# Patient Record
Sex: Female | Born: 1935
Health system: Southern US, Community
[De-identification: ages and names within clinical notes are randomized; demographics above are authoritative.]

## PROBLEM LIST (undated history)

## (undated) DIAGNOSIS — I482 Chronic atrial fibrillation, unspecified: Secondary | ICD-10-CM

## (undated) DIAGNOSIS — I272 Pulmonary hypertension, unspecified: Secondary | ICD-10-CM

## (undated) DIAGNOSIS — C7B09 Secondary carcinoid tumors of other sites: Secondary | ICD-10-CM

## (undated) DIAGNOSIS — F329 Major depressive disorder, single episode, unspecified: Secondary | ICD-10-CM

## (undated) DIAGNOSIS — R42 Dizziness and giddiness: Secondary | ICD-10-CM

## (undated) DIAGNOSIS — L989 Disorder of the skin and subcutaneous tissue, unspecified: Secondary | ICD-10-CM

## (undated) DIAGNOSIS — M1990 Unspecified osteoarthritis, unspecified site: Secondary | ICD-10-CM

## (undated) DIAGNOSIS — E039 Hypothyroidism, unspecified: Secondary | ICD-10-CM

## (undated) DIAGNOSIS — M3130 Wegener's granulomatosis without renal involvement: Secondary | ICD-10-CM

## (undated) DIAGNOSIS — C50919 Malignant neoplasm of unspecified site of unspecified female breast: Secondary | ICD-10-CM

## (undated) DIAGNOSIS — I639 Cerebral infarction, unspecified: Secondary | ICD-10-CM

## (undated) DIAGNOSIS — E785 Hyperlipidemia, unspecified: Secondary | ICD-10-CM

## (undated) DIAGNOSIS — E119 Type 2 diabetes mellitus without complications: Secondary | ICD-10-CM

## (undated) DIAGNOSIS — I10 Essential (primary) hypertension: Secondary | ICD-10-CM

## (undated) DIAGNOSIS — J189 Pneumonia, unspecified organism: Secondary | ICD-10-CM

## (undated) DIAGNOSIS — R011 Cardiac murmur, unspecified: Secondary | ICD-10-CM

## (undated) HISTORY — PX: EXPLORATORY LAPAROTOMY: SUR591

## (undated) HISTORY — DX: Pneumonia, unspecified organism: J18.9

## (undated) HISTORY — PX: JOINT REPLACEMENT: SHX530

## (undated) HISTORY — DX: Unspecified osteoarthritis, unspecified site: M19.90

## (undated) HISTORY — DX: Cerebral infarction, unspecified: I63.9

## (undated) HISTORY — DX: Malignant neoplasm of unspecified site of unspecified female breast: C50.919

## (undated) HISTORY — DX: Type 2 diabetes mellitus without complications: E11.9

## (undated) HISTORY — DX: Dizziness and giddiness: R42

## (undated) HISTORY — DX: Major depressive disorder, single episode, unspecified: F32.9

## (undated) HISTORY — DX: Wegener's granulomatosis without renal involvement: M31.30

## (undated) HISTORY — PX: HEEL SPUR SURGERY: SHX665

## (undated) HISTORY — DX: Essential (primary) hypertension: I10

## (undated) HISTORY — DX: Hyperlipidemia, unspecified: E78.5

## (undated) HISTORY — DX: Hypothyroidism, unspecified: E03.9

---

## 1978-04-07 HISTORY — PX: CHOLECYSTECTOMY: SHX55

## 1999-01-24 ENCOUNTER — Encounter: Payer: Self-pay | Admitting: *Deleted

## 1999-01-24 ENCOUNTER — Emergency Department (HOSPITAL_COMMUNITY): Admission: EM | Admit: 1999-01-24 | Discharge: 1999-01-24 | Payer: Self-pay | Admitting: *Deleted

## 1999-07-18 ENCOUNTER — Encounter: Admission: RE | Admit: 1999-07-18 | Discharge: 1999-07-18 | Payer: Self-pay | Admitting: Obstetrics and Gynecology

## 1999-07-18 ENCOUNTER — Encounter: Payer: Self-pay | Admitting: Obstetrics and Gynecology

## 2000-01-13 ENCOUNTER — Encounter: Payer: Self-pay | Admitting: Sports Medicine

## 2000-01-13 ENCOUNTER — Ambulatory Visit (HOSPITAL_COMMUNITY): Admission: RE | Admit: 2000-01-13 | Discharge: 2000-01-13 | Payer: Self-pay | Admitting: Sports Medicine

## 2000-07-20 ENCOUNTER — Encounter: Admission: RE | Admit: 2000-07-20 | Discharge: 2000-07-20 | Payer: Self-pay | Admitting: Obstetrics and Gynecology

## 2000-07-20 ENCOUNTER — Encounter: Payer: Self-pay | Admitting: Obstetrics and Gynecology

## 2001-07-22 ENCOUNTER — Encounter: Admission: RE | Admit: 2001-07-22 | Discharge: 2001-07-22 | Payer: Self-pay | Admitting: Obstetrics and Gynecology

## 2001-07-22 ENCOUNTER — Encounter: Payer: Self-pay | Admitting: Obstetrics and Gynecology

## 2001-11-11 ENCOUNTER — Encounter: Payer: Self-pay | Admitting: Family Medicine

## 2001-11-11 ENCOUNTER — Encounter: Admission: RE | Admit: 2001-11-11 | Discharge: 2001-11-11 | Payer: Self-pay | Admitting: Family Medicine

## 2002-03-10 ENCOUNTER — Ambulatory Visit (HOSPITAL_COMMUNITY): Admission: RE | Admit: 2002-03-10 | Discharge: 2002-03-10 | Payer: Self-pay | Admitting: Gastroenterology

## 2002-07-19 ENCOUNTER — Encounter: Payer: Self-pay | Admitting: Obstetrics and Gynecology

## 2002-07-19 ENCOUNTER — Encounter: Admission: RE | Admit: 2002-07-19 | Discharge: 2002-07-19 | Payer: Self-pay | Admitting: Obstetrics and Gynecology

## 2002-12-02 ENCOUNTER — Encounter: Admission: RE | Admit: 2002-12-02 | Discharge: 2003-01-18 | Payer: Self-pay | Admitting: Neurology

## 2003-05-05 LAB — HM COLONOSCOPY: HM Colonoscopy: NORMAL

## 2003-07-26 ENCOUNTER — Encounter: Admission: RE | Admit: 2003-07-26 | Discharge: 2003-07-26 | Payer: Self-pay | Admitting: Obstetrics and Gynecology

## 2004-07-31 ENCOUNTER — Encounter: Admission: RE | Admit: 2004-07-31 | Discharge: 2004-07-31 | Payer: Self-pay | Admitting: Obstetrics and Gynecology

## 2004-08-15 ENCOUNTER — Encounter: Admission: RE | Admit: 2004-08-15 | Discharge: 2004-08-15 | Payer: Self-pay | Admitting: Obstetrics and Gynecology

## 2005-04-07 HISTORY — PX: BREAST SURGERY: SHX581

## 2005-08-06 ENCOUNTER — Encounter: Admission: RE | Admit: 2005-08-06 | Discharge: 2005-08-06 | Payer: Self-pay | Admitting: Obstetrics and Gynecology

## 2005-08-19 ENCOUNTER — Encounter (INDEPENDENT_AMBULATORY_CARE_PROVIDER_SITE_OTHER): Payer: Self-pay | Admitting: *Deleted

## 2005-08-19 ENCOUNTER — Encounter (INDEPENDENT_AMBULATORY_CARE_PROVIDER_SITE_OTHER): Payer: Self-pay | Admitting: Diagnostic Radiology

## 2005-08-19 ENCOUNTER — Encounter: Admission: RE | Admit: 2005-08-19 | Discharge: 2005-08-19 | Payer: Self-pay | Admitting: Obstetrics and Gynecology

## 2005-08-31 ENCOUNTER — Encounter: Admission: RE | Admit: 2005-08-31 | Discharge: 2005-08-31 | Payer: Self-pay | Admitting: Obstetrics and Gynecology

## 2005-09-04 ENCOUNTER — Encounter: Admission: RE | Admit: 2005-09-04 | Discharge: 2005-09-04 | Payer: Self-pay | Admitting: Obstetrics and Gynecology

## 2005-09-09 ENCOUNTER — Encounter (INDEPENDENT_AMBULATORY_CARE_PROVIDER_SITE_OTHER): Payer: Self-pay | Admitting: *Deleted

## 2005-09-09 ENCOUNTER — Encounter: Admission: RE | Admit: 2005-09-09 | Discharge: 2005-09-09 | Payer: Self-pay | Admitting: Surgery

## 2005-09-09 ENCOUNTER — Ambulatory Visit (HOSPITAL_BASED_OUTPATIENT_CLINIC_OR_DEPARTMENT_OTHER): Admission: RE | Admit: 2005-09-09 | Discharge: 2005-09-09 | Payer: Self-pay | Admitting: Surgery

## 2005-09-25 ENCOUNTER — Ambulatory Visit: Payer: Self-pay | Admitting: Oncology

## 2005-10-01 LAB — CBC WITH DIFFERENTIAL/PLATELET
Basophils Absolute: 0.1 10*3/uL (ref 0.0–0.1)
EOS%: 5 % (ref 0.0–7.0)
HGB: 11.1 g/dL — ABNORMAL LOW (ref 11.6–15.9)
MCH: 33 pg (ref 26.0–34.0)
MONO#: 0.8 10*3/uL (ref 0.1–0.9)
NEUT#: 3.9 10*3/uL (ref 1.5–6.5)
RDW: 12 % (ref 11.3–14.5)
WBC: 7.2 10*3/uL (ref 3.9–10.0)
lymph#: 2.1 10*3/uL (ref 0.9–3.3)

## 2005-10-01 LAB — COMPREHENSIVE METABOLIC PANEL
ALT: 9 U/L (ref 0–40)
AST: 14 U/L (ref 0–37)
Albumin: 4 g/dL (ref 3.5–5.2)
BUN: 11 mg/dL (ref 6–23)
CO2: 26 mEq/L (ref 19–32)
Calcium: 9.1 mg/dL (ref 8.4–10.5)
Chloride: 89 mEq/L — ABNORMAL LOW (ref 96–112)
Potassium: 4.8 mEq/L (ref 3.5–5.3)

## 2005-10-01 LAB — LACTATE DEHYDROGENASE: LDH: 147 U/L (ref 94–250)

## 2005-10-06 ENCOUNTER — Ambulatory Visit: Admission: RE | Admit: 2005-10-06 | Discharge: 2006-01-04 | Payer: Self-pay | Admitting: Radiation Oncology

## 2005-10-07 ENCOUNTER — Ambulatory Visit (HOSPITAL_COMMUNITY): Admission: RE | Admit: 2005-10-07 | Discharge: 2005-10-07 | Payer: Self-pay | Admitting: Oncology

## 2005-10-09 ENCOUNTER — Encounter: Admission: RE | Admit: 2005-10-09 | Discharge: 2005-10-09 | Payer: Self-pay | Admitting: Oncology

## 2005-10-28 ENCOUNTER — Encounter: Admission: RE | Admit: 2005-10-28 | Discharge: 2005-10-28 | Payer: Self-pay

## 2005-12-25 ENCOUNTER — Ambulatory Visit: Payer: Self-pay | Admitting: Oncology

## 2005-12-29 LAB — CBC WITH DIFFERENTIAL/PLATELET
Basophils Absolute: 0 10*3/uL (ref 0.0–0.1)
Eosinophils Absolute: 0.1 10*3/uL (ref 0.0–0.5)
HCT: 31.3 % — ABNORMAL LOW (ref 34.8–46.6)
HGB: 11.1 g/dL — ABNORMAL LOW (ref 11.6–15.9)
LYMPH%: 26.4 % (ref 14.0–48.0)
MONO#: 0.6 10*3/uL (ref 0.1–0.9)
NEUT%: 52.2 % (ref 39.6–76.8)
Platelets: 307 10*3/uL (ref 145–400)
WBC: 3.6 10*3/uL — ABNORMAL LOW (ref 3.9–10.0)
lymph#: 1 10*3/uL (ref 0.9–3.3)

## 2005-12-29 LAB — COMPREHENSIVE METABOLIC PANEL
ALT: 11 U/L (ref 0–40)
BUN: 15 mg/dL (ref 6–23)
CO2: 25 mEq/L (ref 19–32)
Calcium: 9.3 mg/dL (ref 8.4–10.5)
Chloride: 87 mEq/L — ABNORMAL LOW (ref 96–112)
Creatinine, Ser: 0.71 mg/dL (ref 0.40–1.20)
Glucose, Bld: 194 mg/dL — ABNORMAL HIGH (ref 70–99)
Total Bilirubin: 0.5 mg/dL (ref 0.3–1.2)

## 2005-12-29 LAB — LACTATE DEHYDROGENASE: LDH: 139 U/L (ref 94–250)

## 2006-02-18 ENCOUNTER — Ambulatory Visit: Payer: Self-pay | Admitting: Oncology

## 2006-02-23 LAB — COMPREHENSIVE METABOLIC PANEL
AST: 17 U/L (ref 0–37)
Albumin: 4.1 g/dL (ref 3.5–5.2)
Alkaline Phosphatase: 47 U/L (ref 39–117)
Potassium: 4.3 mEq/L (ref 3.5–5.3)
Sodium: 127 mEq/L — ABNORMAL LOW (ref 135–145)
Total Protein: 7.5 g/dL (ref 6.0–8.3)

## 2006-02-23 LAB — CBC WITH DIFFERENTIAL/PLATELET
BASO%: 0.5 % (ref 0.0–2.0)
EOS%: 3.2 % (ref 0.0–7.0)
MCH: 33.1 pg (ref 26.0–34.0)
MCV: 93.6 fL (ref 81.0–101.0)
MONO%: 14 % — ABNORMAL HIGH (ref 0.0–13.0)
RBC: 3.21 10*6/uL — ABNORMAL LOW (ref 3.70–5.32)
RDW: 11.9 % (ref 11.3–14.5)

## 2006-04-07 DIAGNOSIS — I639 Cerebral infarction, unspecified: Secondary | ICD-10-CM

## 2006-04-07 HISTORY — DX: Cerebral infarction, unspecified: I63.9

## 2006-05-26 ENCOUNTER — Ambulatory Visit: Payer: Self-pay | Admitting: Oncology

## 2006-05-28 LAB — COMPREHENSIVE METABOLIC PANEL
AST: 17 U/L (ref 0–37)
BUN: 10 mg/dL (ref 6–23)
CO2: 27 mEq/L (ref 19–32)
Calcium: 9.3 mg/dL (ref 8.4–10.5)
Chloride: 96 mEq/L (ref 96–112)
Creatinine, Ser: 0.68 mg/dL (ref 0.40–1.20)
Total Bilirubin: 0.6 mg/dL (ref 0.3–1.2)

## 2006-05-28 LAB — LACTATE DEHYDROGENASE: LDH: 146 U/L (ref 94–250)

## 2006-05-28 LAB — ERYTHROCYTE SEDIMENTATION RATE: Sed Rate: 40 mm/hr — ABNORMAL HIGH (ref 0–30)

## 2006-05-28 LAB — CBC WITH DIFFERENTIAL/PLATELET
BASO%: 0.3 % (ref 0.0–2.0)
LYMPH%: 29.9 % (ref 14.0–48.0)
MCHC: 35 g/dL (ref 32.0–36.0)
MONO#: 0.6 10*3/uL (ref 0.1–0.9)
Platelets: 298 10*3/uL (ref 145–400)
RBC: 3.78 10*6/uL (ref 3.70–5.32)
RDW: 11.7 % (ref 11.3–14.5)
WBC: 4.6 10*3/uL (ref 3.9–10.0)

## 2006-05-28 LAB — IRON AND TIBC
%SAT: 31 % (ref 20–55)
Iron: 96 ug/dL (ref 42–145)
TIBC: 309 ug/dL (ref 250–470)
UIBC: 213 ug/dL

## 2006-05-28 LAB — MORPHOLOGY

## 2006-05-29 ENCOUNTER — Encounter: Admission: RE | Admit: 2006-05-29 | Discharge: 2006-05-29 | Payer: Self-pay | Admitting: Oncology

## 2006-06-08 LAB — SPEP & IFE WITH QIG
Albumin ELP: 53.6 % — ABNORMAL LOW (ref 55.8–66.1)
Alpha-1-Globulin: 3.2 % (ref 2.9–4.9)
Alpha-2-Globulin: 10.4 % (ref 7.1–11.8)
Beta 2: 5.7 % (ref 3.2–6.5)
Beta Globulin: 6.6 % (ref 4.7–7.2)
IgA: 500 mg/dL — ABNORMAL HIGH (ref 68–378)
Total Protein, Serum Electrophoresis: 8.5 g/dL — ABNORMAL HIGH (ref 6.0–8.3)

## 2006-09-21 ENCOUNTER — Ambulatory Visit: Payer: Self-pay | Admitting: Oncology

## 2006-09-24 LAB — CBC & DIFF AND RETIC
Basophils Absolute: 0 10*3/uL (ref 0.0–0.1)
Eosinophils Absolute: 0.1 10*3/uL (ref 0.0–0.5)
HCT: 32.4 % — ABNORMAL LOW (ref 34.8–46.6)
HGB: 11.8 g/dL (ref 11.6–15.9)
IRF: 0.35 — ABNORMAL HIGH (ref 0.130–0.330)
MCV: 92.2 fL (ref 81.0–101.0)
MONO%: 12.2 % (ref 0.0–13.0)
NEUT#: 2.7 10*3/uL (ref 1.5–6.5)
NEUT%: 50.8 % (ref 39.6–76.8)
RDW: 12 % (ref 11.3–14.5)
RETIC #: 84.5 10*3/uL (ref 19.7–115.1)
lymph#: 1.9 10*3/uL (ref 0.9–3.3)

## 2006-09-25 LAB — IRON AND TIBC
Iron: 109 ug/dL (ref 42–145)
TIBC: 324 ug/dL (ref 250–470)

## 2006-09-25 LAB — COMPREHENSIVE METABOLIC PANEL
ALT: 19 U/L (ref 0–35)
AST: 24 U/L (ref 0–37)
Albumin: 4.4 g/dL (ref 3.5–5.2)
CO2: 25 mEq/L (ref 19–32)
Calcium: 9.3 mg/dL (ref 8.4–10.5)
Chloride: 94 mEq/L — ABNORMAL LOW (ref 96–112)
Potassium: 4.2 mEq/L (ref 3.5–5.3)

## 2006-09-25 LAB — FERRITIN: Ferritin: 39 ng/mL (ref 10–291)

## 2006-09-25 LAB — CANCER ANTIGEN 27.29: CA 27.29: 22 U/mL (ref 0–39)

## 2006-10-15 ENCOUNTER — Encounter: Admission: RE | Admit: 2006-10-15 | Discharge: 2007-01-13 | Payer: Self-pay | Admitting: Neurology

## 2006-10-19 ENCOUNTER — Encounter (INDEPENDENT_AMBULATORY_CARE_PROVIDER_SITE_OTHER): Payer: Self-pay | Admitting: Neurology

## 2006-10-19 ENCOUNTER — Inpatient Hospital Stay (HOSPITAL_COMMUNITY): Admission: EM | Admit: 2006-10-19 | Discharge: 2006-10-20 | Payer: Self-pay | Admitting: Emergency Medicine

## 2007-01-01 ENCOUNTER — Encounter: Admission: RE | Admit: 2007-01-01 | Discharge: 2007-01-01 | Payer: Self-pay | Admitting: Family Medicine

## 2007-01-07 ENCOUNTER — Encounter: Admission: RE | Admit: 2007-01-07 | Discharge: 2007-01-07 | Payer: Self-pay | Admitting: Family Medicine

## 2007-01-26 ENCOUNTER — Ambulatory Visit: Payer: Self-pay | Admitting: Oncology

## 2007-01-28 LAB — CBC & DIFF AND RETIC
BASO%: 0.9 % (ref 0.0–2.0)
Eosinophils Absolute: 0.1 10*3/uL (ref 0.0–0.5)
HCT: 33.1 % — ABNORMAL LOW (ref 34.8–46.6)
LYMPH%: 33 % (ref 14.0–48.0)
MCHC: 34.9 g/dL (ref 32.0–36.0)
MCV: 94.1 fL (ref 81.0–101.0)
MONO#: 0.6 10*3/uL (ref 0.1–0.9)
MONO%: 10.5 % (ref 0.0–13.0)
NEUT%: 53.8 % (ref 39.6–76.8)
Platelets: 311 10*3/uL (ref 145–400)
WBC: 5.5 10*3/uL (ref 3.9–10.0)

## 2007-01-29 LAB — COMPREHENSIVE METABOLIC PANEL
Albumin: 4.2 g/dL (ref 3.5–5.2)
CO2: 26 mEq/L (ref 19–32)
Glucose, Bld: 121 mg/dL — ABNORMAL HIGH (ref 70–99)
Potassium: 4.3 mEq/L (ref 3.5–5.3)
Sodium: 132 mEq/L — ABNORMAL LOW (ref 135–145)
Total Protein: 7.5 g/dL (ref 6.0–8.3)

## 2007-01-29 LAB — CANCER ANTIGEN 27.29: CA 27.29: 19 U/mL (ref 0–39)

## 2007-01-29 LAB — LACTATE DEHYDROGENASE: LDH: 152 U/L (ref 94–250)

## 2007-01-29 LAB — FERRITIN: Ferritin: 89 ng/mL (ref 10–291)

## 2007-02-18 ENCOUNTER — Encounter: Admission: RE | Admit: 2007-02-18 | Discharge: 2007-02-18 | Payer: Self-pay | Admitting: Family Medicine

## 2007-04-08 HISTORY — PX: KNEE SURGERY: SHX244

## 2007-05-14 ENCOUNTER — Ambulatory Visit: Payer: Self-pay

## 2007-05-24 ENCOUNTER — Ambulatory Visit: Payer: Self-pay | Admitting: Oncology

## 2007-05-26 LAB — CBC WITH DIFFERENTIAL/PLATELET
Basophils Absolute: 0.1 10*3/uL (ref 0.0–0.1)
EOS%: 2.5 % (ref 0.0–7.0)
Eosinophils Absolute: 0.2 10*3/uL (ref 0.0–0.5)
HCT: 32.4 % — ABNORMAL LOW (ref 34.8–46.6)
HGB: 11.2 g/dL — ABNORMAL LOW (ref 11.6–15.9)
MCH: 32.4 pg (ref 26.0–34.0)
MCV: 94.3 fL (ref 81.0–101.0)
MONO%: 10.8 % (ref 0.0–13.0)
NEUT#: 4.9 10*3/uL (ref 1.5–6.5)
NEUT%: 58.5 % (ref 39.6–76.8)
Platelets: 277 10*3/uL (ref 145–400)

## 2007-05-27 LAB — COMPREHENSIVE METABOLIC PANEL
AST: 12 U/L (ref 0–37)
Albumin: 4.1 g/dL (ref 3.5–5.2)
Alkaline Phosphatase: 55 U/L (ref 39–117)
BUN: 9 mg/dL (ref 6–23)
Calcium: 9.4 mg/dL (ref 8.4–10.5)
Creatinine, Ser: 0.64 mg/dL (ref 0.40–1.20)
Glucose, Bld: 153 mg/dL — ABNORMAL HIGH (ref 70–99)

## 2007-05-27 LAB — VITAMIN D 25 HYDROXY (VIT D DEFICIENCY, FRACTURES): Vit D, 25-Hydroxy: 26 ng/mL — ABNORMAL LOW (ref 30–89)

## 2007-05-27 LAB — CANCER ANTIGEN 27.29: CA 27.29: 19 U/mL (ref 0–39)

## 2007-05-31 ENCOUNTER — Encounter: Admission: RE | Admit: 2007-05-31 | Discharge: 2007-05-31 | Payer: Self-pay | Admitting: Oncology

## 2007-06-01 ENCOUNTER — Encounter: Admission: RE | Admit: 2007-06-01 | Discharge: 2007-06-01 | Payer: Self-pay | Admitting: Oncology

## 2007-10-11 ENCOUNTER — Encounter: Admission: RE | Admit: 2007-10-11 | Discharge: 2007-10-11 | Payer: Self-pay | Admitting: Oncology

## 2007-10-20 ENCOUNTER — Inpatient Hospital Stay (HOSPITAL_COMMUNITY): Admission: RE | Admit: 2007-10-20 | Discharge: 2007-10-25 | Payer: Self-pay | Admitting: Orthopedic Surgery

## 2008-01-21 ENCOUNTER — Ambulatory Visit: Payer: Self-pay | Admitting: Oncology

## 2008-01-27 LAB — CBC WITH DIFFERENTIAL/PLATELET
Basophils Absolute: 0 10*3/uL (ref 0.0–0.1)
Eosinophils Absolute: 0.1 10*3/uL (ref 0.0–0.5)
HCT: 31.5 % — ABNORMAL LOW (ref 34.8–46.6)
HGB: 11.1 g/dL — ABNORMAL LOW (ref 11.6–15.9)
MCH: 32.9 pg (ref 26.0–34.0)
MONO#: 0.6 10*3/uL (ref 0.1–0.9)
NEUT#: 3.2 10*3/uL (ref 1.5–6.5)
NEUT%: 53 % (ref 39.6–76.8)
RDW: 12.1 % (ref 11.3–14.5)
WBC: 6.1 10*3/uL (ref 3.9–10.0)
lymph#: 2.1 10*3/uL (ref 0.9–3.3)

## 2008-01-28 LAB — COMPREHENSIVE METABOLIC PANEL
AST: 17 U/L (ref 0–37)
Albumin: 4 g/dL (ref 3.5–5.2)
BUN: 15 mg/dL (ref 6–23)
CO2: 24 mEq/L (ref 19–32)
Calcium: 9.1 mg/dL (ref 8.4–10.5)
Chloride: 94 mEq/L — ABNORMAL LOW (ref 96–112)
Creatinine, Ser: 0.85 mg/dL (ref 0.40–1.20)
Potassium: 4.4 mEq/L (ref 3.5–5.3)

## 2008-01-28 LAB — CANCER ANTIGEN 27.29: CA 27.29: 17 U/mL (ref 0–39)

## 2008-05-31 ENCOUNTER — Encounter: Admission: RE | Admit: 2008-05-31 | Discharge: 2008-05-31 | Payer: Self-pay | Admitting: Oncology

## 2008-05-31 ENCOUNTER — Encounter: Payer: Self-pay | Admitting: Family Medicine

## 2008-07-21 ENCOUNTER — Ambulatory Visit: Payer: Self-pay | Admitting: Oncology

## 2008-07-24 ENCOUNTER — Ambulatory Visit: Payer: Self-pay | Admitting: Family Medicine

## 2008-07-24 DIAGNOSIS — E785 Hyperlipidemia, unspecified: Secondary | ICD-10-CM

## 2008-07-24 DIAGNOSIS — M1990 Unspecified osteoarthritis, unspecified site: Secondary | ICD-10-CM | POA: Insufficient documentation

## 2008-07-24 DIAGNOSIS — E039 Hypothyroidism, unspecified: Secondary | ICD-10-CM

## 2008-07-24 DIAGNOSIS — I10 Essential (primary) hypertension: Secondary | ICD-10-CM

## 2008-07-24 HISTORY — DX: Hyperlipidemia, unspecified: E78.5

## 2008-07-24 HISTORY — DX: Hypothyroidism, unspecified: E03.9

## 2008-07-24 HISTORY — DX: Essential (primary) hypertension: I10

## 2008-07-25 LAB — CBC WITH DIFFERENTIAL/PLATELET
BASO%: 0.5 % (ref 0.0–2.0)
Basophils Absolute: 0 10*3/uL (ref 0.0–0.1)
EOS%: 2.8 % (ref 0.0–7.0)
Eosinophils Absolute: 0.2 10*3/uL (ref 0.0–0.5)
HCT: 32.8 % — ABNORMAL LOW (ref 34.8–46.6)
HGB: 11.3 g/dL — ABNORMAL LOW (ref 11.6–15.9)
LYMPH%: 35.2 % (ref 14.0–49.7)
MCH: 32.6 pg (ref 25.1–34.0)
MCHC: 34.5 g/dL (ref 31.5–36.0)
MCV: 94.3 fL (ref 79.5–101.0)
MONO#: 0.7 10*3/uL (ref 0.1–0.9)
MONO%: 11.3 % (ref 0.0–14.0)
NEUT#: 3.1 10*3/uL (ref 1.5–6.5)
NEUT%: 50.2 % (ref 38.4–76.8)
Platelets: 261 10*3/uL (ref 145–400)
RBC: 3.48 10*6/uL — ABNORMAL LOW (ref 3.70–5.45)
RDW: 12.3 % (ref 11.2–14.5)
WBC: 6.1 10*3/uL (ref 3.9–10.3)
lymph#: 2.1 10*3/uL (ref 0.9–3.3)

## 2008-07-26 LAB — CANCER ANTIGEN 27.29: CA 27.29: 28 U/mL (ref 0–39)

## 2008-07-26 LAB — COMPREHENSIVE METABOLIC PANEL
ALT: 14 U/L (ref 0–35)
AST: 21 U/L (ref 0–37)
CO2: 23 mEq/L (ref 19–32)
Calcium: 9.1 mg/dL (ref 8.4–10.5)
Chloride: 96 mEq/L (ref 96–112)
Sodium: 133 mEq/L — ABNORMAL LOW (ref 135–145)
Total Bilirubin: 0.5 mg/dL (ref 0.3–1.2)
Total Protein: 7.5 g/dL (ref 6.0–8.3)

## 2008-07-26 LAB — VITAMIN D 25 HYDROXY (VIT D DEFICIENCY, FRACTURES): Vit D, 25-Hydroxy: 20 ng/mL — ABNORMAL LOW (ref 30–89)

## 2008-07-26 LAB — LACTATE DEHYDROGENASE: LDH: 143 U/L (ref 94–250)

## 2008-08-09 ENCOUNTER — Encounter: Admission: RE | Admit: 2008-08-09 | Discharge: 2008-09-27 | Payer: Self-pay | Admitting: Oncology

## 2008-08-29 ENCOUNTER — Telehealth: Payer: Self-pay | Admitting: Family Medicine

## 2008-10-23 ENCOUNTER — Ambulatory Visit: Payer: Self-pay | Admitting: Family Medicine

## 2008-10-24 LAB — CONVERTED CEMR LAB
Alkaline Phosphatase: 41 units/L (ref 39–117)
Bilirubin, Direct: 0.1 mg/dL (ref 0.0–0.3)
CO2: 30 meq/L (ref 19–32)
Chloride: 97 meq/L (ref 96–112)
GFR calc non Af Amer: 103.82 mL/min (ref >60)
Glucose, Bld: 183 mg/dL — ABNORMAL HIGH (ref 70–99)
LDL Cholesterol: 72 mg/dL (ref 0–99)
Potassium: 4.1 meq/L (ref 3.5–5.1)
Sodium: 136 meq/L (ref 135–145)
Total Bilirubin: 0.9 mg/dL (ref 0.3–1.2)
Total CHOL/HDL Ratio: 3
VLDL: 37.8 mg/dL (ref 0.0–40.0)

## 2008-10-31 ENCOUNTER — Telehealth: Payer: Self-pay | Admitting: Family Medicine

## 2008-11-20 ENCOUNTER — Ambulatory Visit: Payer: Self-pay | Admitting: Family Medicine

## 2009-01-16 ENCOUNTER — Ambulatory Visit: Payer: Self-pay | Admitting: Family Medicine

## 2009-01-26 ENCOUNTER — Ambulatory Visit: Payer: Self-pay | Admitting: Oncology

## 2009-01-30 LAB — CBC WITH DIFFERENTIAL/PLATELET
Basophils Absolute: 0 10*3/uL (ref 0.0–0.1)
Eosinophils Absolute: 0.2 10*3/uL (ref 0.0–0.5)
HCT: 31.1 % — ABNORMAL LOW (ref 34.8–46.6)
HGB: 11 g/dL — ABNORMAL LOW (ref 11.6–15.9)
MCV: 94.2 fL (ref 79.5–101.0)
NEUT#: 3.2 10*3/uL (ref 1.5–6.5)
NEUT%: 47.5 % (ref 38.4–76.8)
RDW: 11.6 % (ref 11.2–14.5)
lymph#: 2.7 10*3/uL (ref 0.9–3.3)

## 2009-01-31 LAB — COMPREHENSIVE METABOLIC PANEL
Albumin: 4.2 g/dL (ref 3.5–5.2)
BUN: 13 mg/dL (ref 6–23)
Calcium: 9.6 mg/dL (ref 8.4–10.5)
Chloride: 89 mEq/L — ABNORMAL LOW (ref 96–112)
Glucose, Bld: 127 mg/dL — ABNORMAL HIGH (ref 70–99)
Potassium: 4.9 mEq/L (ref 3.5–5.3)

## 2009-01-31 LAB — CANCER ANTIGEN 27.29: CA 27.29: 24 U/mL (ref 0–39)

## 2009-01-31 LAB — VITAMIN D 25 HYDROXY (VIT D DEFICIENCY, FRACTURES): Vit D, 25-Hydroxy: 27 ng/mL — ABNORMAL LOW (ref 30–89)

## 2009-01-31 LAB — LACTATE DEHYDROGENASE: LDH: 129 U/L (ref 94–250)

## 2009-02-06 ENCOUNTER — Encounter (INDEPENDENT_AMBULATORY_CARE_PROVIDER_SITE_OTHER): Payer: Self-pay | Admitting: *Deleted

## 2009-02-20 ENCOUNTER — Telehealth: Payer: Self-pay | Admitting: Internal Medicine

## 2009-03-06 ENCOUNTER — Emergency Department (HOSPITAL_COMMUNITY): Admission: EM | Admit: 2009-03-06 | Discharge: 2009-03-06 | Payer: Self-pay | Admitting: Emergency Medicine

## 2009-03-08 ENCOUNTER — Telehealth: Payer: Self-pay | Admitting: Internal Medicine

## 2009-03-08 ENCOUNTER — Ambulatory Visit: Payer: Self-pay | Admitting: Internal Medicine

## 2009-03-08 DIAGNOSIS — N390 Urinary tract infection, site not specified: Secondary | ICD-10-CM

## 2009-03-08 DIAGNOSIS — R109 Unspecified abdominal pain: Secondary | ICD-10-CM | POA: Insufficient documentation

## 2009-03-15 ENCOUNTER — Ambulatory Visit: Payer: Self-pay | Admitting: Family Medicine

## 2009-04-23 ENCOUNTER — Telehealth: Payer: Self-pay | Admitting: Family Medicine

## 2009-05-03 ENCOUNTER — Telehealth: Payer: Self-pay | Admitting: Family Medicine

## 2009-05-10 ENCOUNTER — Ambulatory Visit: Payer: Self-pay | Admitting: Family Medicine

## 2009-05-11 LAB — CONVERTED CEMR LAB
ALT: 22 units/L (ref 0–35)
AST: 25 units/L (ref 0–37)
Albumin: 3.9 g/dL (ref 3.5–5.2)
Alkaline Phosphatase: 33 units/L — ABNORMAL LOW (ref 39–117)
CO2: 29 meq/L (ref 19–32)
Chloride: 94 meq/L — ABNORMAL LOW (ref 96–112)
GFR calc non Af Amer: 127.94 mL/min (ref >60)
Glucose, Bld: 158 mg/dL — ABNORMAL HIGH (ref 70–99)
Hgb A1c MFr Bld: 7.8 % — ABNORMAL HIGH (ref 4.6–6.5)
Potassium: 4 meq/L (ref 3.5–5.1)
Sodium: 134 meq/L — ABNORMAL LOW (ref 135–145)
VLDL: 32.4 mg/dL (ref 0.0–40.0)

## 2009-05-16 ENCOUNTER — Ambulatory Visit: Payer: Self-pay | Admitting: Family Medicine

## 2009-05-16 DIAGNOSIS — F329 Major depressive disorder, single episode, unspecified: Secondary | ICD-10-CM

## 2009-05-16 DIAGNOSIS — F3289 Other specified depressive episodes: Secondary | ICD-10-CM

## 2009-05-16 HISTORY — DX: Other specified depressive episodes: F32.89

## 2009-05-16 HISTORY — DX: Major depressive disorder, single episode, unspecified: F32.9

## 2009-06-06 ENCOUNTER — Encounter: Admission: RE | Admit: 2009-06-06 | Discharge: 2009-06-06 | Payer: Self-pay | Admitting: Oncology

## 2009-06-07 ENCOUNTER — Telehealth: Payer: Self-pay | Admitting: Family Medicine

## 2009-07-02 LAB — HM MAMMOGRAPHY: HM Mammogram: NORMAL

## 2009-07-17 ENCOUNTER — Telehealth: Payer: Self-pay | Admitting: Family Medicine

## 2009-08-02 ENCOUNTER — Ambulatory Visit: Payer: Self-pay | Admitting: Oncology

## 2009-08-02 ENCOUNTER — Telehealth: Payer: Self-pay | Admitting: Family Medicine

## 2009-08-03 LAB — CBC WITH DIFFERENTIAL/PLATELET
Basophils Absolute: 0 10*3/uL (ref 0.0–0.1)
EOS%: 2.5 % (ref 0.0–7.0)
Eosinophils Absolute: 0.2 10*3/uL (ref 0.0–0.5)
HGB: 10.9 g/dL — ABNORMAL LOW (ref 11.6–15.9)
MCH: 31.8 pg (ref 25.1–34.0)
MONO%: 13.1 % (ref 0.0–14.0)
NEUT#: 3 10*3/uL (ref 1.5–6.5)
RBC: 3.43 10*6/uL — ABNORMAL LOW (ref 3.70–5.45)
RDW: 12.1 % (ref 11.2–14.5)
lymph#: 2 10*3/uL (ref 0.9–3.3)

## 2009-08-03 LAB — COMPREHENSIVE METABOLIC PANEL
AST: 22 U/L (ref 0–37)
Alkaline Phosphatase: 34 U/L — ABNORMAL LOW (ref 39–117)
BUN: 11 mg/dL (ref 6–23)
Chloride: 86 mEq/L — ABNORMAL LOW (ref 96–112)
Potassium: 4 mEq/L (ref 3.5–5.3)
Sodium: 124 mEq/L — ABNORMAL LOW (ref 135–145)
Total Protein: 7.8 g/dL (ref 6.0–8.3)

## 2009-08-03 LAB — LACTATE DEHYDROGENASE: LDH: 150 U/L (ref 94–250)

## 2009-08-03 LAB — CANCER ANTIGEN 27.29: CA 27.29: 19 U/mL (ref 0–39)

## 2009-08-08 ENCOUNTER — Telehealth: Payer: Self-pay | Admitting: Family Medicine

## 2009-08-10 ENCOUNTER — Telehealth: Payer: Self-pay | Admitting: Family Medicine

## 2009-08-10 LAB — CBC & DIFF AND RETIC
BASO%: 0.5 % (ref 0.0–2.0)
Basophils Absolute: 0 10*3/uL (ref 0.0–0.1)
HCT: 31.9 % — ABNORMAL LOW (ref 34.8–46.6)
HGB: 11.2 g/dL — ABNORMAL LOW (ref 11.6–15.9)
LYMPH%: 34.7 % (ref 14.0–49.7)
MCHC: 35.1 g/dL (ref 31.5–36.0)
MONO#: 0.6 10*3/uL (ref 0.1–0.9)
NEUT%: 52.4 % (ref 38.4–76.8)
Platelets: 216 10*3/uL (ref 145–400)
WBC: 6.1 10*3/uL (ref 3.9–10.3)

## 2009-08-14 LAB — COMPREHENSIVE METABOLIC PANEL
Albumin: 4.6 g/dL (ref 3.5–5.2)
CO2: 25 mEq/L (ref 19–32)
Calcium: 9.4 mg/dL (ref 8.4–10.5)
Chloride: 86 mEq/L — ABNORMAL LOW (ref 96–112)
Glucose, Bld: 120 mg/dL — ABNORMAL HIGH (ref 70–99)
Potassium: 4 mEq/L (ref 3.5–5.3)
Sodium: 124 mEq/L — ABNORMAL LOW (ref 135–145)
Total Bilirubin: 0.6 mg/dL (ref 0.3–1.2)
Total Protein: 8.1 g/dL (ref 6.0–8.3)

## 2009-08-14 LAB — PROTEIN ELECTROPHORESIS, SERUM
Albumin ELP: 54.1 % — ABNORMAL LOW (ref 55.8–66.1)
Total Protein, Serum Electrophoresis: 8.1 g/dL (ref 6.0–8.3)

## 2009-08-14 LAB — FERRITIN: Ferritin: 138 ng/mL (ref 10–291)

## 2009-08-14 LAB — FOLATE RBC: RBC Folate: 1982 ng/mL — ABNORMAL HIGH (ref 180–600)

## 2009-08-14 LAB — ERYTHROPOIETIN: Erythropoietin: 8 m[IU]/mL (ref 2.6–34.0)

## 2009-08-14 LAB — CANCER ANTIGEN 27.29: CA 27.29: 25 U/mL (ref 0–39)

## 2009-08-14 LAB — LACTATE DEHYDROGENASE: LDH: 147 U/L (ref 94–250)

## 2009-08-15 ENCOUNTER — Ambulatory Visit: Payer: Self-pay | Admitting: Family Medicine

## 2009-08-16 ENCOUNTER — Telehealth: Payer: Self-pay | Admitting: Family Medicine

## 2009-08-16 LAB — CONVERTED CEMR LAB
Basophils Absolute: 0 K/uL
Basophils Relative: 0.6 %
Creatinine,U: 39 mg/dL
Eosinophils Absolute: 0.1 K/uL
Eosinophils Relative: 2.5 %
HCT: 29.2 % — ABNORMAL LOW
Hemoglobin: 10.4 g/dL — ABNORMAL LOW
Hgb A1c MFr Bld: 7.2 % — ABNORMAL HIGH
Lymphocytes Relative: 29.8 %
Lymphs Abs: 1.7 K/uL
MCHC: 35.6 g/dL
MCV: 94.3 fL
Microalb Creat Ratio: 4.4 mg/g
Microalb, Ur: 1.7 mg/dL
Monocytes Absolute: 0.7 K/uL
Monocytes Relative: 12.8 % — ABNORMAL HIGH
Neutro Abs: 3.2 K/uL
Neutrophils Relative %: 54.3 %
Platelets: 297 K/uL
RBC: 3.1 M/uL — ABNORMAL LOW
RDW: 12.1 %
WBC: 5.8 10*3/microliter

## 2009-08-28 ENCOUNTER — Ambulatory Visit: Payer: Self-pay | Admitting: Family Medicine

## 2009-09-04 ENCOUNTER — Telehealth: Payer: Self-pay | Admitting: Family Medicine

## 2009-10-02 LAB — HM DIABETES EYE EXAM: HM Diabetic Eye Exam: NORMAL

## 2009-10-11 ENCOUNTER — Encounter: Admission: RE | Admit: 2009-10-11 | Discharge: 2009-10-11 | Payer: Self-pay | Admitting: Oncology

## 2009-11-27 ENCOUNTER — Ambulatory Visit: Payer: Self-pay | Admitting: Family Medicine

## 2009-11-27 LAB — CONVERTED CEMR LAB: Cholesterol, target level: 200 mg/dL

## 2009-12-02 LAB — HM DIABETES FOOT EXAM

## 2009-12-30 ENCOUNTER — Encounter: Payer: Self-pay | Admitting: Family Medicine

## 2010-01-02 ENCOUNTER — Encounter: Payer: Self-pay | Admitting: Family Medicine

## 2010-02-05 ENCOUNTER — Telehealth: Payer: Self-pay | Admitting: Family Medicine

## 2010-02-06 ENCOUNTER — Ambulatory Visit: Payer: Self-pay | Admitting: Family Medicine

## 2010-02-07 ENCOUNTER — Telehealth: Payer: Self-pay | Admitting: Family Medicine

## 2010-02-09 ENCOUNTER — Inpatient Hospital Stay (HOSPITAL_COMMUNITY)
Admission: EM | Admit: 2010-02-09 | Discharge: 2010-02-10 | Payer: Self-pay | Source: Home / Self Care | Admitting: Emergency Medicine

## 2010-02-11 ENCOUNTER — Telehealth: Payer: Self-pay | Admitting: Family Medicine

## 2010-02-13 ENCOUNTER — Ambulatory Visit: Payer: Self-pay | Admitting: Family Medicine

## 2010-02-13 ENCOUNTER — Ambulatory Visit: Payer: Self-pay | Admitting: Oncology

## 2010-02-13 DIAGNOSIS — E871 Hypo-osmolality and hyponatremia: Secondary | ICD-10-CM | POA: Insufficient documentation

## 2010-02-14 LAB — CONVERTED CEMR LAB
BUN: 8 mg/dL (ref 6–23)
CO2: 28 meq/L (ref 19–32)
Chloride: 97 meq/L (ref 96–112)
Creatinine, Ser: 0.8 mg/dL (ref 0.4–1.2)

## 2010-02-18 ENCOUNTER — Telehealth (INDEPENDENT_AMBULATORY_CARE_PROVIDER_SITE_OTHER): Payer: Self-pay | Admitting: *Deleted

## 2010-02-22 ENCOUNTER — Encounter: Payer: Self-pay | Admitting: Family Medicine

## 2010-02-22 LAB — CBC WITH DIFFERENTIAL/PLATELET
Eosinophils Absolute: 0.2 10*3/uL (ref 0.0–0.5)
MONO#: 0.8 10*3/uL (ref 0.1–0.9)
NEUT#: 3.6 10*3/uL (ref 1.5–6.5)
RBC: 3.17 10*6/uL — ABNORMAL LOW (ref 3.70–5.45)
RDW: 12 % (ref 11.2–14.5)
WBC: 6.5 10*3/uL (ref 3.9–10.3)
lymph#: 2 10*3/uL (ref 0.9–3.3)

## 2010-02-23 LAB — COMPREHENSIVE METABOLIC PANEL
Albumin: 4.3 g/dL (ref 3.5–5.2)
CO2: 23 mEq/L (ref 19–32)
Glucose, Bld: 209 mg/dL — ABNORMAL HIGH (ref 70–99)
Potassium: 4.1 mEq/L (ref 3.5–5.3)
Sodium: 130 mEq/L — ABNORMAL LOW (ref 135–145)
Total Protein: 7.4 g/dL (ref 6.0–8.3)

## 2010-02-23 LAB — LACTATE DEHYDROGENASE: LDH: 161 U/L (ref 94–250)

## 2010-02-23 LAB — VITAMIN D 25 HYDROXY (VIT D DEFICIENCY, FRACTURES): Vit D, 25-Hydroxy: 38 ng/mL (ref 30–89)

## 2010-02-23 LAB — FERRITIN: Ferritin: 136 ng/mL (ref 10–291)

## 2010-02-23 LAB — IRON AND TIBC: %SAT: 20 % (ref 20–55)

## 2010-02-27 ENCOUNTER — Ambulatory Visit: Payer: Self-pay | Admitting: Family Medicine

## 2010-03-06 ENCOUNTER — Telehealth: Payer: Self-pay | Admitting: Family Medicine

## 2010-03-25 ENCOUNTER — Telehealth: Payer: Self-pay | Admitting: Family Medicine

## 2010-03-25 ENCOUNTER — Ambulatory Visit: Payer: Self-pay | Admitting: Family Medicine

## 2010-03-25 DIAGNOSIS — R609 Edema, unspecified: Secondary | ICD-10-CM

## 2010-04-04 ENCOUNTER — Ambulatory Visit: Payer: Self-pay | Admitting: Family Medicine

## 2010-04-05 ENCOUNTER — Telehealth: Payer: Self-pay | Admitting: Family Medicine

## 2010-04-05 LAB — CONVERTED CEMR LAB
Albumin: 3.9 g/dL (ref 3.5–5.2)
Alkaline Phosphatase: 35 units/L — ABNORMAL LOW (ref 39–117)
BUN: 11 mg/dL (ref 6–23)
Bilirubin, Direct: 0.1 mg/dL (ref 0.0–0.3)
CO2: 29 meq/L (ref 19–32)
Chloride: 93 meq/L — ABNORMAL LOW (ref 96–112)
Creatinine, Ser: 0.7 mg/dL (ref 0.4–1.2)
LDL Cholesterol: 96 mg/dL (ref 0–99)
Total CHOL/HDL Ratio: 4

## 2010-04-18 ENCOUNTER — Other Ambulatory Visit: Payer: Self-pay | Admitting: Family Medicine

## 2010-04-18 ENCOUNTER — Ambulatory Visit
Admission: RE | Admit: 2010-04-18 | Discharge: 2010-04-18 | Payer: Self-pay | Source: Home / Self Care | Attending: Family Medicine | Admitting: Family Medicine

## 2010-04-18 DIAGNOSIS — R1032 Left lower quadrant pain: Secondary | ICD-10-CM | POA: Insufficient documentation

## 2010-04-18 LAB — BASIC METABOLIC PANEL
BUN: 12 mg/dL (ref 6–23)
CO2: 25 mEq/L (ref 19–32)
Calcium: 9.3 mg/dL (ref 8.4–10.5)
Chloride: 94 mEq/L — ABNORMAL LOW (ref 96–112)
Creatinine, Ser: 0.7 mg/dL (ref 0.4–1.2)
GFR: 83.78 mL/min (ref >60.00)
Glucose, Bld: 149 mg/dL — ABNORMAL HIGH (ref 70–99)
Potassium: 4.9 mEq/L (ref 3.5–5.1)
Sodium: 132 mEq/L — ABNORMAL LOW (ref 135–145)

## 2010-04-18 LAB — CBC WITH DIFFERENTIAL/PLATELET
Basophils Absolute: 0 10*3/uL (ref 0.0–0.1)
Basophils Relative: 0.4 % (ref 0.0–3.0)
Eosinophils Absolute: 0.1 10*3/uL (ref 0.0–0.7)
Eosinophils Relative: 2.1 % (ref 0.0–5.0)
HCT: 31.9 % — ABNORMAL LOW (ref 36.0–46.0)
Hemoglobin: 11.3 g/dL — ABNORMAL LOW (ref 12.0–15.0)
Lymphocytes Relative: 32.6 % (ref 12.0–46.0)
Lymphs Abs: 2.3 10*3/uL (ref 0.7–4.0)
MCHC: 35.5 g/dL (ref 30.0–36.0)
MCV: 94.4 fl (ref 78.0–100.0)
Monocytes Absolute: 0.8 10*3/uL (ref 0.1–1.0)
Monocytes Relative: 11.3 % (ref 3.0–12.0)
Neutro Abs: 3.7 10*3/uL (ref 1.4–7.7)
Neutrophils Relative %: 53.6 % (ref 43.0–77.0)
Platelets: 298 10*3/uL (ref 150.0–400.0)
RBC: 3.38 Mil/uL — ABNORMAL LOW (ref 3.87–5.11)
RDW: 11.5 % (ref 11.5–14.6)
WBC: 7 10*3/uL (ref 4.5–10.5)

## 2010-04-19 ENCOUNTER — Telehealth: Payer: Self-pay | Admitting: Family Medicine

## 2010-04-27 ENCOUNTER — Other Ambulatory Visit: Payer: Self-pay | Admitting: Oncology

## 2010-04-27 DIAGNOSIS — Z9889 Other specified postprocedural states: Secondary | ICD-10-CM

## 2010-04-28 ENCOUNTER — Encounter: Payer: Self-pay | Admitting: Obstetrics and Gynecology

## 2010-04-29 ENCOUNTER — Telehealth: Payer: Self-pay | Admitting: Family Medicine

## 2010-05-07 NOTE — Progress Notes (Signed)
Summary: Pt req to add iron lvl check to labs on 08/15/09  Phone Note Call from Patient Call back at Home Phone [phone]   Caller: Patient Summary of Call: Pt called and is coming in for labs work on 08/15/09. Pt is wanting to and an iron level check, because she has been taking Tandem Plus iron pill for over 3 years now. Please advise.  Initial call taken by: Braulio Bosch,  Aug 08, 2009 11:41 AM  Follow-up for Phone Call        OK to add CBC and serum iron to labs. Follow-up by: Carolann Littler MD,  Aug 08, 2009 1:31 PM  Additional Follow-up for Phone Call Additional follow up Details #1::        I called pt and informed her that the above mentioned labs, will be added to her labs for 08/15/09.   Additional Follow-up by: Braulio Bosch,  Aug 08, 2009 2:33 PM

## 2010-05-07 NOTE — Assessment & Plan Note (Signed)
Summary: medication problems/dm   Vital Signs:  Patient profile:   77 year old female Weight:      219 pounds Temp:     98.0 degrees F oral BP sitting:   150 / 78  (left arm) Cuff size:   large  Vitals Entered By: Nira Conn LPN (February 27, 813 10:02 AM)  History of Present Illness: Here for f/u BP.  Change atenolol to Bystolic since last visit and feels better and  BP better controlled. Does have some mild edema.  No orthopnea.  Edema better first thing in AM. Less fatigue since change of med.  Hypertension History:      She complains of peripheral edema, but denies headache, chest pain, palpitations, dyspnea with exertion, orthopnea, PND, visual symptoms, neurologic problems, syncope, and side effects from treatment.        Positive major cardiovascular risk factors include female age 42 years old or older, diabetes, hyperlipidemia, and hypertension.  Negative major cardiovascular risk factors include non-tobacco-user status.        Further assessment for target organ damage reveals no history of ASHD or peripheral vascular disease.     Allergies: 1)  ! Codeine Sulfate (Codeine Sulfate) 2)  Sulfa  Past History:  Past Medical History: Last updated: 11/20/2008 Diabetes mellitus, type II Hyperlipidemia Hypertension Hypothyroidism Osteoarthritis cerebrovascular disease/Sttroke 2008 chronic vertigo (hx AVN) left breast cancer 2007  Physical Exam  General:  Well-developed,well-nourished,in no acute distress; alert,appropriate and cooperative throughout examination Mouth:  Oral mucosa and oropharynx without lesions or exudates.  Teeth in good repair. Neck:  No deformities, masses, or tenderness noted. Lungs:  Normal respiratory effort, chest expands symmetrically. Lungs are clear to auscultation, no crackles or wheezes. Heart:  normal rate and regular rhythm.   Extremities:  trace edema legs and ankles.   Impression & Recommendations:  Problem # 1:  HYPERTENSION  (ICD-401.9) Assessment Improved Cont Bystolic with samples given. The following medications were removed from the medication list:    Atenolol 50 Mg Tabs (Atenolol) ..... One two times a day Her updated medication list for this problem includes:    Exforge 10-160 Mg Tabs (Amlodipine besylate-valsartan) ..... One by mouth once daily    Bystolic 5 Mg Tabs (Nebivolol hcl) ..... One by mouth once daily  Complete Medication List: 1)  Exforge 10-160 Mg Tabs (Amlodipine besylate-valsartan) .... One by mouth once daily 2)  Arimidex 1 Mg Tabs (Anastrozole) .... Once daily 3)  Fosamax 35 Mg Tabs (Alendronate sodium) .... One by mouth weekly 4)  Citracal Plus Tabs (Multiple minerals-vitamins) .... 1,200 mg once daily 5)  Centrum Silver Ultra Womens Tabs (Multiple vitamins-minerals) .... Once daily 6)  Glucophage Xr 500 Mg Xr24h-tab (Metformin hcl) .... Two tabs two times a day (total 1040m two times a day 7)  Glucotrol 10 Mg Tabs (Glipizide) .... Two times a day 8)  Levothroid 125 Mcg Tabs (Levothyroxine sodium) .... Once daily 9)  Crestor 5 Mg Tabs (Rosuvastatin calcium) .... Once daily 10)  Vitamin B-12 100 Mcg Tabs (Cyanocobalamin) .... Once daily 11)  Cvs Vitamin B-6 200 Mg Tabs (Pyridoxine hcl) .... Once daily 12)  Drisdol 50000 Unit Caps (Ergocalciferol) .... Once daily 13)  Aspirin Adult Low Strength 81 Mg Tbec (Aspirin) .... Once daily 14)  Tandem Plus 162-115.2-1 Mg Caps (Fefum-fepo-fa-b cmp-c-zn-mn-cu) .... Once daily 15)  Diazepam 5 Mg Tabs (Diazepam) .... One daily as needed for vertigo 16)  Metanx 3-35-2 Mg Tabs (L-methylfolate-b6-b12) .... Once daily 17)  Wellbutrin Xl 300  Mg Xr24h-tab (Bupropion hcl) .... One by mouth once daily 18)  Bystolic 5 Mg Tabs (Nebivolol hcl) .... One by mouth once daily  Hypertension Assessment/Plan:      The patient's hypertensive risk group is category C: Target organ damage and/or diabetes.  Her calculated 10 year risk of coronary heart disease is 17  %.  Today's blood pressure is 150/78.  Her blood pressure goal is < 130/80.  Patient Instructions: 1)  Please schedule a follow-up appointment in 3 months .    Orders Added: 1)  Est. Patient Level III [77414]

## 2010-05-07 NOTE — Progress Notes (Signed)
Summary: Pt coming in for labs on 08/15/09. Iron lvls already checked  Phone Note Call from Patient   Caller: Patient Summary of Call: Pt called and said that she had labs done at [facility] by Dr. Rich Reining. Pts labs showed that iron lvl is low and Dr. Audelia Hives said that he is sending a report to Dr. Elease Hashimoto and that pt did not need to get iron lvls recheck during her next lab on 08/15/09 at [facility]. Pls remove order for iron lvls from labs.        Initial call taken by: Braulio Bosch,  Aug 10, 2009 3:55 PM  Follow-up for Phone Call        OK to remove. Follow-up by: Carolann Littler MD,  Aug 10, 2009 5:43 PM  Additional Follow-up for Phone Call Additional follow up Details #1::        I removed iron labs, as noted above. Additional Follow-up by: Braulio Bosch,  Aug 13, 2009 9:42 AM

## 2010-05-07 NOTE — Progress Notes (Signed)
Summary: nausea  Phone Note Call from Patient   Caller: Patient Call For: Carolann Littler MD Summary of Call: Pt would like to have RX for nausea called in to [facility] ([place])  Initial call taken by: Deanna Artis CMA AAMA,  February 07, 2010 4:35 PM  Follow-up for Phone Call        spoke with pt.  Nausea for 2-3 weeks.  Similar episode last year improved with metoclopromide.  She does have hx diabetes and ?gastroparesis.  Will refill med. Follow-up by: Carolann Littler MD,  February 07, 2010 5:57 PM    New/Updated Medications: METOCLOPRAMIDE HCL 10 MG TABS (METOCLOPRAMIDE HCL) one by mouth qid - q AC meals and q hs. Prescriptions: METOCLOPRAMIDE HCL 10 MG TABS (METOCLOPRAMIDE HCL) one by mouth qid - q AC meals and q hs.  #40 x 0   Entered and Authorized by:   Carolann Littler MD   Signed by:   Carolann Littler MD on 02/07/2010   Method used:   Electronically to        [facility]  [phone]* (retail)       [address]       [place], [place]  [address]       Ph: [phone] or [phone]       Fax: [phone]   RxID:   [id number]

## 2010-05-07 NOTE — Assessment & Plan Note (Signed)
Summary: 3 month fup//ccm   Vital Signs:  Patient profile:   77 year old female Weight:      224 pounds Temp:     98.3 degrees F oral BP sitting:   148 / 70  (left arm) Cuff size:   large  Vitals Entered By: Nira Conn LPN (November 28, 9751 10:09 AM) CC: 3 month follow-up, Hypertension Management, Lipid Management Is Patient Diabetic? Yes Did you bring your meter with you today? Yes   History of Present Illness: Patient here for followup regarding multiple items as below  Hypothyroidism and due for repeat labs. Compliant with therapy. No recent weight loss or weight gain.  Type 2 diabetes. Fasting blood sugars around 175. Predinner around 120. No hypoglycemia. No symptoms of hyperglycemia.  Hypertension with probable white coat syndrome. Blood pressures consistently well-controlled at home. Compliant with medications.  History hyperlipidemia. Patient Crestor 5 mg daily. Needs written prescription today. No myalgias.  Diabetes Management History:      She has not been enrolled in the "Diabetic Education Program".  She states understanding of dietary principles but she is not following the appropriate diet.  No sensory loss is reported.  Self foot exams are being performed.  She is not checking home blood sugars.  She says that she is not exercising regularly.        Hypoglycemic symptoms are not occurring.  No hyperglycemic symptoms are reported.        There are no symptoms to suggest diabetic complications.  No changes have been made to her treatment plan since last visit.    Hypertension History:      She denies headache, chest pain, palpitations, orthopnea, peripheral edema, syncope, and side effects from treatment.  She notes no problems with any antihypertensive medication side effects.        Positive major cardiovascular risk factors include female age 29 years old or older, diabetes, hyperlipidemia, and hypertension.  Negative major cardiovascular risk factors include  non-tobacco-user status.        Further assessment for target organ damage reveals no history of ASHD or peripheral vascular disease.    Lipid Management History:      Positive NCEP/ATP III risk factors include female age 24 years old or older, diabetes, and hypertension.  Negative NCEP/ATP III risk factors include non-tobacco-user status, no ASHD (atherosclerotic heart disease), no peripheral vascular disease, and no history of aortic aneurysm.      Allergies: 1)  ! Codeine Sulfate (Codeine Sulfate) 2)  Sulfa  Past History:  Past Medical History: Last updated: 11/20/2008 Diabetes mellitus, type II Hyperlipidemia Hypertension Hypothyroidism Osteoarthritis cerebrovascular disease/Sttroke 2008 chronic vertigo (hx AVN) left breast cancer 2007  Physical Exam  General:  Well-developed,well-nourished,in no acute distress; alert,appropriate and cooperative throughout examination Ears:  External ear exam shows no significant lesions or deformities.  Otoscopic examination reveals clear canals, tympanic membranes are intact bilaterally without bulging, retraction, inflammation or discharge. Hearing is grossly normal bilaterally. Mouth:  Oral mucosa and oropharynx without lesions or exudates.  Teeth in good repair. Neck:  No deformities, masses, or tenderness noted. Lungs:  Normal respiratory effort, chest expands symmetrically. Lungs are clear to auscultation, no crackles or wheezes. Heart:  normal rate and regular rhythm.  2/6 SEM LUSB  Diabetes Management Exam:    Foot Exam (with socks and/or shoes not present):       Sensory-Pinprick/Light touch:          Left medial foot (L-4): normal  Left dorsal foot (L-5): normal          Left lateral foot (S-1): normal          Right medial foot (L-4): normal          Right dorsal foot (L-5): normal          Right lateral foot (S-1): normal       Sensory-Monofilament:          Left foot: normal          Right foot: normal        Inspection:          Left foot: normal          Right foot: normal       Nails:          Left foot: normal          Right foot: normal    Eye Exam:       Eye Exam done elsewhere          Date: 09/12/2009          Results: normal          Done by: Dr Arnoldo Morale   Impression & Recommendations:  Problem # 1:  HYPOTHYROIDISM (ICD-244.9) recheck labs. Her updated medication list for this problem includes:    Levothroid 125 Mcg Tabs (Levothyroxine sodium) ..... Once daily  Orders: Venipuncture (29924) Specimen Handling (99000) TLB-TSH (Thyroid Stimulating Hormone) (84443-TSH)  Problem # 2:  HYPERTENSION (ICD-401.9) up here but consistently controlled at home.  Pt has brought cuff here previously to check with ours and consistent readings. Her updated medication list for this problem includes:    Exforge 5-160 Mg Tabs (Amlodipine besylate-valsartan) ..... Once daily    Atenolol 50 Mg Tabs (Atenolol) ..... One two times a day    Hydrochlorothiazide 25 Mg Tabs (Hydrochlorothiazide) ..... One tablet once daily  Orders: Venipuncture (26834) Specimen Handling (99000)  Problem # 3:  HYPERLIPIDEMIA (HDQ-222.4)  Her updated medication list for this problem includes:    Crestor 5 Mg Tabs (Rosuvastatin calcium) ..... Once daily  Orders: Prescription Created Electronically [phone])  Problem # 4:  DIABETES MELLITUS, TYPE II (ICD-250.00) recheck A1C.  PT needs to lose weight. Her updated medication list for this problem includes:    Exforge 5-160 Mg Tabs (Amlodipine besylate-valsartan) ..... Once daily    Glucophage Xr 500 Mg Xr24h-tab (Metformin hcl) .Marland Kitchen..Marland Kitchen Two tabs two times a day (total 1029m two times a day    Glucotrol 10 Mg Tabs (Glipizide) ..Marland Kitchen..Marland KitchenTwo times a day    Aspirin Adult Low Strength 81 Mg Tbec (Aspirin) ..... Once daily  Orders: Venipuncture ((21194 Specimen Handling (99000) TLB-A1C / Hgb A1C (Glycohemoglobin) (83036-A1C)  Complete Medication List: 1)  Exforge 5-160  Mg Tabs (Amlodipine besylate-valsartan) .... Once daily 2)  Arimidex 1 Mg Tabs (Anastrozole) .... Once daily 3)  Atenolol 50 Mg Tabs (Atenolol) .... One two times a day 4)  Fosamax 35 Mg Tabs (Alendronate sodium) .... One by mouth weekly 5)  Citracal Plus Tabs (Multiple minerals-vitamins) .... 1,200 mg once daily 6)  Centrum Silver Ultra Womens Tabs (Multiple vitamins-minerals) .... Once daily 7)  Glucophage Xr 500 Mg Xr24h-tab (Metformin hcl) .... Two tabs two times a day (total 1002mtwo times a day 8)  Glucotrol 10 Mg Tabs (Glipizide) .... Two times a day 9)  Levothroid 125 Mcg Tabs (Levothyroxine sodium) .... Once daily 10)  Crestor 5 Mg Tabs (Rosuvastatin calcium) .... Once daily  11)  Vitamin B-12 100 Mcg Tabs (Cyanocobalamin) .... Once daily 12)  Cvs Vitamin B-6 200 Mg Tabs (Pyridoxine hcl) .... Once daily 13)  Drisdol 50000 Unit Caps (Ergocalciferol) .... Once daily 14)  Aspirin Adult Low Strength 81 Mg Tbec (Aspirin) .... Once daily 15)  Tandem Plus 162-115.2-1 Mg Caps (Fefum-fepo-fa-b cmp-c-zn-mn-cu) .... Once daily 16)  Hydrochlorothiazide 25 Mg Tabs (Hydrochlorothiazide) .... One tablet once daily 17)  Diazepam 5 Mg Tabs (Diazepam) .... One daily as needed for vertigo 18)  Metanx 3-35-2 Mg Tabs (L-methylfolate-b6-b12) .... Once daily 19)  Zithromax Z-pak 250 Mg Tabs (Azithromycin) .... As directed  Diabetes Management Assessment/Plan:      The following lipid goals have been established for the patient: Total cholesterol goal of 200; LDL cholesterol goal of 100; HDL cholesterol goal of 40; Triglyceride goal of 150.  Her blood pressure goal is < 130/80.    Hypertension Assessment/Plan:      The patient's hypertensive risk group is category C: Target organ damage and/or diabetes.  Her calculated 10 year risk of coronary heart disease is 17 %.  Today's blood pressure is 148/70.  Her blood pressure goal is < 130/80.  Lipid Assessment/Plan:      Based on NCEP/ATP III, the patient's  risk factor category is "history of diabetes".  The patient's lipid goals are as follows: Total cholesterol goal is 200; LDL cholesterol goal is 100; HDL cholesterol goal is 40; Triglyceride goal is 150.    Patient Instructions: 1)  Please schedule a follow-up appointment in 3 months .  2)  You need to lose weight. Consider a lower calorie diet and regular exercise.  3)  Check your blood sugars regularly. If your readings are usually above: 140 fasting or below 70 you should contact our office.  4)  It is important that your diabetic A1c level is checked every 3 months.  5)  See your eye doctor yearly to check for diabetic eye damage. 6)  Check your feet each night  for sore areas, calluses or signs of infection.  7)  Check your  Blood Pressure regularly . If it is above:140/90   you should make an appointment. Prescriptions: CRESTOR 5 MG TABS (ROSUVASTATIN CALCIUM) once daily  #90 x 3   Entered and Authorized by:   Carolann Littler MD   Signed by:   Carolann Littler MD on 11/27/2009   Method used:   Print then Give to Patient   RxID:   [id number]    Preventive Care Screening  Colonoscopy:    Date:  04/08/2003    Results:  normal

## 2010-05-07 NOTE — Assessment & Plan Note (Signed)
Summary: 2 MNTH ROV//SLM   Vital Signs:  Patient profile:   77 year old female Weight:      229 pounds Temp:     98.7 degrees F oral BP sitting:   160 / 80  (left arm) Cuff size:   large  Vitals Entered By: Nira Conn LPN (May 16, 1608 10:56 AM) CC: 2 month follow-up visit, Hypertension Management, Depression Is Patient Diabetic? Yes Did you bring your meter with you today? No   History of Present Illness: Patient here for followup Re: multiple items as follows.  Right earache for the past one week. Denies sore throat. No hearing changes. No drainage. Chronic vertigo which is unchanged.  Type 2 diabetes. Recent A1c 7.8%. Occasional nocturia. No polydipsia. Patient on Glucophage and Glucotrol. Very reluctant to consider insulin. Inconsistent exercise. Recent weight gain. Poor dietary compliance.  Hypertension treated with multiple medications. Compliant with meds. No orthostatic symptoms.  Progressive depressive symptoms which are almost daily.  Denies suicidal thoughts.  no prior history of treatment for depression.   Depression History:      Positive alarm features for depression include fatigue (loss of energy) and impaired concentration (indecisiveness).  However, she denies significant weight loss, significant weight gain, insomnia, feelings of worthlessness (guilt), and recurrent thoughts of death or suicide.        Comments:  ZUNG SDS 51.  Hypertension History:      She denies headache, chest pain, palpitations, dyspnea with exertion, orthopnea, PND, visual symptoms, neurologic problems, syncope, and side effects from treatment.  She notes no problems with any antihypertensive medication side effects.  Further comments include: chronic mild peripheral edema unchanged.        Positive major cardiovascular risk factors include female age 42 years old or older, diabetes, hyperlipidemia, and hypertension.  Negative major cardiovascular risk factors include  non-tobacco-user status.      Allergies: 1)  ! Ferrous Sulfate (Ferrous Sulfate) 2)  ! Codeine Sulfate (Codeine Sulfate)  Past History:  Past Medical History: Last updated: 11/20/2008 Diabetes mellitus, type II Hyperlipidemia Hypertension Hypothyroidism Osteoarthritis cerebrovascular disease/Sttroke 2008 chronic vertigo (hx AVN) left breast cancer 2007  Past Surgical History: Last updated: 11/20/2008 Rt Knee replacement 2009 Cholecystectomy 1980 Breast biopsy, cancer 2007  Social History: Last updated: 07/24/2008 Retired Married Never Smoked Alcohol use-no PMH-FH-SH reviewed for relevance  Review of Systems      See HPI  Physical Exam  General:  alert, well-developed, and well-nourished.   Head:  Normocephalic and atraumatic without obvious abnormalities. No apparent alopecia or balding.  No TMJ tenderness. Eyes:  No corneal or conjunctival inflammation noted. EOMI. Perrla. Funduscopic exam benign, without hemorrhages, exudates or papilledema. Vision grossly normal. Ears:  External ear exam shows no significant lesions or deformities.  Otoscopic examination reveals clear canals, tympanic membranes are intact bilaterally without bulging, retraction, inflammation or discharge. Hearing is grossly normal bilaterally. Mouth:  Oral mucosa and oropharynx without lesions or exudates.  Teeth in good repair. Neck:  No deformities, masses, or tenderness noted. Lungs:  Normal respiratory effort, chest expands symmetrically. Lungs are clear to auscultation, no crackles or wheezes. Heart:  normal rate and regular rhythm.  2 or 6 systolic murmur right upper sternal border Extremities:  trace pitting edema of lower legs bilaterally   Impression & Recommendations:  Problem # 1:  DIABETES MELLITUS, TYPE II (ICD-250.00) long discussion with patient regarding management options. She is very reluctant to go on any additional medication. She will increase her exercise work  on weight  loss and reassess 3 months Her updated medication list for this problem includes:    Exforge 5-160 Mg Tabs (Amlodipine besylate-valsartan) ..... Once daily    Glucophage Xr 500 Mg Xr24h-tab (Metformin hcl) .Marland Kitchen..Marland Kitchen Two tabs two times a day (total 10100m two times a day    Glucotrol 10 Mg Tabs (Glipizide) ..Marland Kitchen..Marland KitchenTwo times a day    Aspirin Adult Low Strength 81 Mg Tbec (Aspirin) ..... Once daily  Problem # 2:  HYPERTENSION (ICD-401.9) titrate Hydrochlorothiazide to one full tablet daily Her updated medication list for this problem includes:    Exforge 5-160 Mg Tabs (Amlodipine besylate-valsartan) ..... Once daily    Atenolol 50 Mg Tabs (Atenolol) ..... One two times a day    Hydrochlorothiazide 25 Mg Tabs (Hydrochlorothiazide) ..... One tablet once daily  Problem # 3:  DEPRESSION (ICD-311) Zung screening questionnaire administered. Scored SDS 51 which would indicate near normal range possibly minimal depression Her updated medication list for this problem includes:    Diazepam 5 Mg Tabs (Diazepam) ..... One daily as needed for vertigo  Problem # 4:  OTOGENIC PAIN (ICD-388.71) normal exam.  Complete Medication List: 1)  Exforge 5-160 Mg Tabs (Amlodipine besylate-valsartan) .... Once daily 2)  Arimidex 1 Mg Tabs (Anastrozole) .... Once daily 3)  Atenolol 50 Mg Tabs (Atenolol) .... One two times a day 4)  Fosamax 35 Mg Tabs (Alendronate sodium) .... One by mouth weekly 5)  Citracal Plus Tabs (Multiple minerals-vitamins) .... 1,200 mg once daily 6)  Centrum Silver Ultra Womens Tabs (Multiple vitamins-minerals) .... Once daily 7)  Glucophage Xr 500 Mg Xr24h-tab (Metformin hcl) .... Two tabs two times a day (total 1002mtwo times a day 8)  Glucotrol 10 Mg Tabs (Glipizide) .... Two times a day 9)  Levothroid 125 Mcg Tabs (Levothyroxine sodium) .... Once daily 10)  Crestor 5 Mg Tabs (Rosuvastatin calcium) .... Once daily 11)  Vitamin B-12 100 Mcg Tabs (Cyanocobalamin) .... Once daily 12)  Cvs  Vitamin B-6 200 Mg Tabs (Pyridoxine hcl) .... Once daily 13)  Drisdol 50000 Unit Caps (Ergocalciferol) .... Once daily 14)  Aspirin Adult Low Strength 81 Mg Tbec (Aspirin) .... Once daily 15)  Tandem Plus 162-115.2-1 Mg Caps (Fefum-fepo-fa-b cmp-c-zn-mn-cu) .... Once daily 16)  Hydrochlorothiazide 25 Mg Tabs (Hydrochlorothiazide) .... One tablet once daily 17)  Diazepam 5 Mg Tabs (Diazepam) .... One daily as needed for vertigo 18)  Metanx 3-35-2 Mg Tabs (L-methylfolate-b6-b12) .... Once daily  Hypertension Assessment/Plan:      The patient's hypertensive risk group is category C: Target organ damage and/or diabetes.  Her calculated 10 year risk of coronary heart disease is 20 %.  Today's blood pressure is 160/80.    Patient Instructions: 1)  It is important that you exercise reguarly at least 20 minutes 5 times a week. If you develop chest pain, have severe difficulty breathing, or feel very tired, stop exercising immediately and seek medical attention.  2)  You need to lose weight. Consider a lower calorie diet and regular exercise.  3)  Gradually increase her exercise levels. 4)  Increase HCTZ to one full tablet daily 5)  Please schedule a follow-up appointment in 3 months .  6)  HgBA1c prior to visit  ICD-9: 250.02 7)  Urine Microalbumin prior to visit ICD-9 : 250.02 Prescriptions: HYDROCHLOROTHIAZIDE 25 MG TABS (HYDROCHLOROTHIAZIDE) one tablet once daily  #30 x 11   Entered and Authorized by:   BrCarolann LittlerD   Signed by:   BrDarnell Level  Burchette MD on 05/16/2009   Method used:   Electronically to        [facility]  [phone]* (retail)       [address]       [place], [place]  [address]       Ph: [phone] or [phone]       Fax: [phone]   RxID:   [id number]

## 2010-05-07 NOTE — Progress Notes (Signed)
Summary: Call A Nurse   [facility] Triage Call Report Triage Record Num: [id number] Operator: Thereasa Parkin Patient Name: Tyquasia Pant Call Date & Time: 02/09/2010 11:41:21AM Patient Phone: [phone] PCP: Carolann Littler Patient Gender: Female PCP Fax : [phone] Patient DOB: 16-Aug-1932 Practice Name: [facility] Reason for Call: 77 yo Calling re given new RX Bupropion 300 mg 02/05/10 for depression; reports is unable to function d/t over sedation. Was unable to make dinner d/t vertigo. Omitted Bupropion this AM. Unable to walk without asistance d/t loss of coordination. Advised to call 911 now for new loss of coordination per Dizziness/Vertigo Guideline. Protocol(s) Used: Dizziness or Vertigo Recommended Outcome per Protocol: Activate EMS 911 Reason for Outcome: New paralysis (unable to move); new weakness (not due to pain); new loss of coordination (purposeful action) OR new unexplained numbness/tingling involving arm and leg on same side of body Care Advice:  ~ Protect the patient from falling or other harm.  ~ Do not give the patient anything to eat or drink.  ~ An adult should stay with the patient, preferably one trained in CPR.  ~ IMMEDIATE ACTION Write down provider's name. List or place the following in a bag for transport with the patient: current prescription and/or nonprescription medications; alternative treatments, therapies and medications; and street drugs.  ~ 02/09/2010 11:53:34AM Page 1 of 1 [email or web address]

## 2010-05-07 NOTE — Progress Notes (Signed)
Summary: Pt req refill of Tandem for iron - to [employer] Note Call from Patient Call back at [facility] Phone [phone]   Caller: Patient Summary of Call: Pt called [facility] on [address] and was told that if she needed a refill, she would have to call the doctors in order to get it, or have the doctors office call the script in. Pt is req refill of Tandem for iron. Pls call in.  Initial call taken by: Braulio Bosch,  Aug 16, 2009 10:23 AM    Prescriptions: TANDEM PLUS 162-115.2-1 MG CAPS (FEFUM-FEPO-FA-B CMP-C-ZN-MN-CU) once daily  #30 x 11   Entered by:   Nira Conn LPN   Authorized by:   Carolann Littler MD   Signed by:   Nira Conn LPN on 87/86/7672   Method used:   Electronically to        [facility]  [phone]* (retail)       [address]       [place], [place]  [address]       Ph: [phone] or [phone]       Fax: [phone]   RxID:   [id number]

## 2010-05-07 NOTE — Progress Notes (Signed)
Summary: BP med change  Phone Note Call from Patient Call back at Home Phone [phone]   Caller: Patient Summary of Call: Patient called this morning concerning her BP. She said this morning it was 190/80 without any food and she did take her BP medicine. She is very concered because she is feeling dizzy and is having some trouble walking around. She says she has felt like this all week. She would like to have her BP medicine changed. Pharmacy is [facility] on [address]. Please advise.   Patient is currently on Atenolol 36m two times a day. Initial call taken by: LElna Breslow  February 18, 2010 9:01 AM  Follow-up for Phone Call        Confirm that she is still taking Exforge (we increased her dose recently to 10/160) Follow-up by: BCarolann LittlerMD,  February 18, 2010 1:01 PM  Additional Follow-up for Phone Call Additional follow up Details #1::        Spoke with patient and she said she taking the Exforge 130mand the Atenolol 5036m D/C Atenolol and start Bystolic 5 mg daily (samples if available) and cont Exforge.  She is not a good candidate for diuretics with recent low sodium. Reassess BP with office f/u in 2-3 weeks after med changes. Additional Follow-up by: BruCarolann Littler,  February 18, 2010 1:46 PM    Additional Follow-up for Phone Call Additional follow up Details #2::    Patient is aware of all changes and will come get samples.  Follow-up by: LizElna BreslowNovember 14, 2011 2:03 PM

## 2010-05-07 NOTE — Progress Notes (Signed)
Summary: Pt req refill of Fosamax and Levothyroxine  Phone Note Call from Patient Call back at Home Phone [phone]   Caller: Patient Summary of Call: Pt called and said that her pharmacy sent a refill for generic Fosamax 15m and generic Levothyroxin 1248m 74m35monthpply on both. [facility] [address]  Initial call taken by: CarBraulio BoschApril 12, 2011 11:49 AM    Prescriptions: LEVOTHROID 125 MCG TABS (LEVOTHYROXINE SODIUM) once daily  #30 x 11   Entered by:   NanNira ConnN   Authorized by:   BruCarolann Littler   Signed by:   NanNira ConnN on 04/43/73/5789Method used:   [phone]       [address]    [place]  [id number]    Ph: [phone]    Fax: [phone]:   [id number] 35 MG TABS (ALENDRONATE SODIUM) one by mouth weekly  #4 x 11   Entered by:   NanNira ConnN   Authorized by:   BruCarolann Littler   Signed by:   NanNira ConnN on 08/03/95/9150Method used:   [phone]       [address]    [place]  [id number]    Ph: [phone]    Fax: [phone]:   [id number]

## 2010-05-07 NOTE — Progress Notes (Signed)
Summary: please advise of bp med  Phone Note Call from Patient Call back at Home Phone [phone]   Caller: Patient---triage vm Call For: Carolann Littler MD Summary of Call: was rx'd bp meds. it is making her sick and having ankle swelling.  her bp is 168/103. Please return call. Initial call taken by: Despina Arias,  March 06, 2010 11:26 AM  Follow-up for Phone Call        Bystolic is making her nauseated, ankles cont to swell and even with hte support hose they seem worse.  Her BP has been 168/103, later today 168/101.  Pt questioning if she should go back to taking the Atenolol? Follow-up by: Nira Conn LPN,  March 06, 8294 3:17 PM  Additional Follow-up for Phone Call Additional follow up Details #1::        No .  Inform her that Bystolic unlikely to cause ankle edema.  Did she eat too much sodium over Thanksgiving?  Her Exforge can cause some edema but I think this is difficult to stop at this time.  She really needs to lose some weight.  Her BP was much improved at last visit.  I would have monitor for another week. Additional Follow-up by: Carolann Littler MD,  March 06, 2010 5:26 PM    Additional Follow-up for Phone Call Additional follow up Details #2::    Pt informed.  She has been taking the Bystolic two times a day?  Directions in EMR say once daily.  Pt will begin taking Exforge in the AM and one Bystolic with dinner.  She will update [place] if her symptoms do not improve. Follow-up by: Nira Conn LPN,  March 07, 6212 2:30 PM  Additional Follow-up for Phone Call Additional follow up Details #3:: Details for Additional Follow-up Action Taken: She should only be taking Bystolic once daily. Additional Follow-up by: Carolann Littler MD,  March 10, 2010 9:10 PM

## 2010-05-07 NOTE — Progress Notes (Signed)
Summary: 90 day supply Atenolol X 1 year  Phone Note Call from Patient Call back at Home Phone [phone]   Caller: Patient Call For: Carolann Littler MD Summary of Call: pt is requesting atenolol 50 mg twice a day #180 for 3 month supply  call into [facility] [address] [phone] Initial call taken by: Glo Herring,  August 02, 2009 9:35 AM  Follow-up for Phone Call        OK to refill for 6 months. Follow-up by: Carolann Littler MD,  August 02, 2009 10:31 AM    Prescriptions: ATENOLOL 50 MG TABS (ATENOLOL) one two times a day  #180 x 3   Entered by:   Nira Conn LPN   Authorized by:   Carolann Littler MD   Signed by:   Nira Conn LPN on 93/79/0240   Method used:   Electronically to        [facility]  [phone]* (retail)       [address]       [place], [place]  [address]       Ph: [phone] or [phone]       Fax: [phone]   RxID:   [id number]

## 2010-05-07 NOTE — Progress Notes (Signed)
Summary: 90 day request for Glipizide X 1 year  Phone Note Call from Patient Call back at Home Phone [phone]   Caller: Patient Call For: Carolann Littler MD Summary of Call: pt needs 180 pills of glipizide 10 mg for 3 month supply [facility] [address] [id number] Initial call taken by: Glo Herring,  June 07, 2009 2:44 PM  Follow-up for Phone Call        Rx sent as requested, pt informed Follow-up by: Nira Conn LPN,  June 08, 5463 3:56 PM    Prescriptions: GLUCOTROL 10 MG TABS (GLIPIZIDE) two times a day  #180 x 3   Entered by:   Nira Conn LPN   Authorized by:   Carolann Littler MD   Signed by:   Nira Conn LPN on 03/54/6568   Method used:   Electronically to        [facility]  [phone]* (retail)       [address]       [place], [place]  [address]       Ph: [phone] or [phone]       Fax: [phone]   RxID:   [id number]

## 2010-05-07 NOTE — Assessment & Plan Note (Signed)
Summary: Post hosp/dm   Vital Signs:  Patient profile:   77 year old female Weight:      218 pounds Temp:     98.5 degrees F oral BP sitting:   150 / 80  (left arm) Cuff size:   large  Vitals Entered By: Nira Conn LPN (February 14, 9531 9:43 AM)  History of Present Illness: Patient here for hospital followup. Refer to last note. She developed progressive nausea and was admitted with sodium of 121. Other labs included lipase and cardiac enzymes which were negative. Hemoglobin 11.3 with some chronic low-grade anemia. Sodium improved to 134 at discharge. Her nausea resolved. She was taken off hydrochlorothiazide. Only other recent med change was Wellbutrin which to my knowledge is not linked to hyponatremia. She does not take any SSRIs.  Recent increase in Exforge dosage. No side effects.   Recent initiation of Wellbutrin for depressed mood.  She states about 2 hours after taking, she feels extremely weak and she is reluctant to remain on at this time.  Hypertension History:      She denies headache, chest pain, palpitations, PND, peripheral edema, and syncope.        Positive major cardiovascular risk factors include female age 69 years old or older, diabetes, hyperlipidemia, and hypertension.  Negative major cardiovascular risk factors include non-tobacco-user status.        Further assessment for target organ damage reveals no history of ASHD or peripheral vascular disease.     Allergies: 1)  ! Codeine Sulfate (Codeine Sulfate) 2)  Sulfa  Past History:  Past Medical History: Last updated: 11/20/2008 Diabetes mellitus, type II Hyperlipidemia Hypertension Hypothyroidism Osteoarthritis cerebrovascular disease/Sttroke 2008 chronic vertigo (hx AVN) left breast cancer 2007  Past Surgical History: Last updated: 11/20/2008 Rt Knee replacement 2009 Cholecystectomy 1980 Breast biopsy, cancer 2007  Family History: Last updated: 07/24/2008 Family History of Arthritis  mother Breast cancer, both sisters heart disease, father  Social History: Last updated: 07/24/2008 Retired Married Never Smoked Alcohol use-no  Risk Factors: Exercise: no (08/28/2009)  Risk Factors: Smoking Status: never (07/24/2008) PMH-FH-SH reviewed for relevance  Review of Systems  The patient denies anorexia, fever, weight loss, chest pain, syncope, peripheral edema, prolonged cough, headaches, and abdominal pain.    Physical Exam  General:  Well-developed,well-nourished,in no acute distress; alert,appropriate and cooperative throughout examination Eyes:  pupils equal, pupils round, and pupils reactive to light.   Mouth:  Oral mucosa and oropharynx without lesions or exudates.  Teeth in good repair. Neck:  No deformities, masses, or tenderness noted. Lungs:  Normal respiratory effort, chest expands symmetrically. Lungs are clear to auscultation, no crackles or wheezes. Heart:  Normal rate and regular rhythm. S1 and S2 normal without gallop, murmur, click, rub or other extra sounds. Extremities:  No clubbing, cyanosis, edema, or deformity noted with normal full range of motion of all joints.   Neurologic:  alert & oriented X3, cranial nerves II-XII intact, and strength normal in all extremities.   Psych:  normally interactive, good eye contact, not anxious appearing, and not depressed appearing.     Impression & Recommendations:  Problem # 1:  HYPONATREMIA (ICD-276.1) now off HCTZ.  No other clear precipitating factors.  Follow up today to reassess. Orders: Specimen Handling (99000) Venipuncture (02334) TLB-BMP (Basic Metabolic Panel-BMET) (35686-[id number])  Problem # 2:  DEPRESSION (ICD-311) possible side effects from Wellbutrin.  She will hold at this time. Her updated medication list for this problem includes:    Diazepam 5 Mg  Tabs (Diazepam) ..... One daily as needed for vertigo    Wellbutrin Xl 300 Mg Xr24h-tab (Bupropion hcl) ..... One by mouth once  daily  Orders: Specimen Handling (99000) Venipuncture (77414)  Problem # 3:  HYPERTENSION (ICD-401.9)  The following medications were removed from the medication list:    Hydrochlorothiazide 25 Mg Tabs (Hydrochlorothiazide) ..... One tablet once daily Her updated medication list for this problem includes:    Exforge 10-160 Mg Tabs (Amlodipine besylate-valsartan) ..... One by mouth once daily    Atenolol 50 Mg Tabs (Atenolol) ..... One two times a day  Orders: Specimen Handling (99000) Venipuncture (23953)  Complete Medication List: 1)  Exforge 10-160 Mg Tabs (Amlodipine besylate-valsartan) .... One by mouth once daily 2)  Arimidex 1 Mg Tabs (Anastrozole) .... Once daily 3)  Atenolol 50 Mg Tabs (Atenolol) .... One two times a day 4)  Fosamax 35 Mg Tabs (Alendronate sodium) .... One by mouth weekly 5)  Citracal Plus Tabs (Multiple minerals-vitamins) .... 1,200 mg once daily 6)  Centrum Silver Ultra Womens Tabs (Multiple vitamins-minerals) .... Once daily 7)  Glucophage Xr 500 Mg Xr24h-tab (Metformin hcl) .... Two tabs two times a day (total 1030m two times a day 8)  Glucotrol 10 Mg Tabs (Glipizide) .... Two times a day 9)  Levothroid 125 Mcg Tabs (Levothyroxine sodium) .... Once daily 10)  Crestor 5 Mg Tabs (Rosuvastatin calcium) .... Once daily 11)  Vitamin B-12 100 Mcg Tabs (Cyanocobalamin) .... Once daily 12)  Cvs Vitamin B-6 200 Mg Tabs (Pyridoxine hcl) .... Once daily 13)  Drisdol 50000 Unit Caps (Ergocalciferol) .... Once daily 14)  Aspirin Adult Low Strength 81 Mg Tbec (Aspirin) .... Once daily 15)  Tandem Plus 162-115.2-1 Mg Caps (Fefum-fepo-fa-b cmp-c-zn-mn-cu) .... Once daily 16)  Diazepam 5 Mg Tabs (Diazepam) .... One daily as needed for vertigo 17)  Metanx 3-35-2 Mg Tabs (L-methylfolate-b6-b12) .... Once daily 18)  Wellbutrin Xl 300 Mg Xr24h-tab (Bupropion hcl) .... One by mouth once daily 19)  Metoclopramide Hcl 10 Mg Tabs (Metoclopramide hcl) .... One by mouth qid -  q ac meals and q hs.  Hypertension Assessment/Plan:      The patient's hypertensive risk group is category C: Target organ damage and/or diabetes.  Her calculated 10 year risk of coronary heart disease is 17 %.  Today's blood pressure is 150/80.  Her blood pressure goal is < 130/80.  Patient Instructions: 1)  Please schedule a follow-up appointment in 1 month.  2)  Continue to hold HCTZ.   Orders Added: 1)  Est. Patient Level IV [[20233]2)  Specimen Handling [[43568]3)  Venipuncture [[61683]4)  TLB-BMP (Basic Metabolic Panel-BMET) [[[id number]]

## 2010-05-07 NOTE — Progress Notes (Signed)
Summary: med refill Metanx X 1 year  Phone Note Call from Patient Call back at Home Phone [phone]   Call For: [name] Littler MD Summary of Call: Pt would like a refill on metanx pt stated dr Jannifer Franklin neurologist prescribed med for her and dr Daryan Cagley has always given her refills.[facility] [address] Initial call taken by: Glo Herring,  May 03, 2009 12:11 PM  Follow-up for Phone Call        OK to refill Follow-up by: [name] Littler MD,  May 03, 2009 3:54 PM    New/Updated Medications: METANX 3-35-2 MG TABS (L-METHYLFOLATE-B6-B12) once daily Prescriptions: METANX 3-35-2 MG TABS (L-METHYLFOLATE-B6-B12) once daily  #90 x 3   Entered by:   Nira Conn LPN   Authorized by:   [name] Littler MD   Signed by:   Nira Conn LPN on 90/24/0973   Method used:   Electronically to        [facility]  [phone]* (retail)       [address]       [place], [place]  [address]       Ph: [phone] or [phone]       Fax: [phone]   RxID:   [id number]

## 2010-05-07 NOTE — Medication Information (Signed)
Summary: Order for Diabetic Shoes-Addended  Order for Diabetic Shoes-Addended   Imported By: Laural Benes 01/03/2010 10:37:46  _____________________________________________________________________  External Attachment:    Type:   Image     Comment:   External Document

## 2010-05-07 NOTE — Progress Notes (Signed)
Summary: FYI increased blood pressure  Phone Note Call from Patient   Summary of Call: patient is calling because her blood pressure reading today were 169/80 180/80, some nausea.  this has been going off and on for about a week.  No chest pain or SOB.   Initial call taken by: Westley Hummer CMA Deborra Medina),  February 05, 2010 4:29 PM  Follow-up for Phone Call        patient has an appointment tomorrow at 8:15 and told to go to er tonight if any worse symptoms.  pt is aware and would also like a flu vaccine tomorrow. Follow-up by: Westley Hummer CMA Deborra Medina),  February 05, 2010 4:31 PM  Additional Follow-up for Phone Call Additional follow up Details #1::        agree with advice.

## 2010-05-07 NOTE — Progress Notes (Signed)
Summary: worse & refill  Phone Note Call from Patient Call back at Home Phone [phone]   Reason for Call: Refill Medication Summary of Call: 1)Allegra every day.  Not better.  Congested cough, sometimes productive of light yellow mucus.  No fever.  Scratchy throat.  Coughing head off & feels terrible.  Sniffles. Says she usually gets Zpak.  2) Crestir refill.   [facility] [address].  Allergic codeine & sulfa. Initial call taken by: Shelbie Hutching, RN,  Sep 04, 2009 8:25 AM  Follow-up for Phone Call        OK to start Zithromax (Z-pack) and refill Crestor for 6 months. Follow-up by: Carolann Littler MD,  Sep 04, 2009 8:36 AM    New/Updated Medications: ZITHROMAX Z-PAK 250 MG TABS (AZITHROMYCIN) As directed Prescriptions: CRESTOR 5 MG TABS (ROSUVASTATIN CALCIUM) once daily  #90 x 1   Entered by:   Shelbie Hutching, RN   Authorized by:   Carolann Littler MD   Signed by:   Shelbie Hutching, RN on 09/04/2009   Method used:   Electronically to        [facility]  [phone]* (retail)       [address]       [place], [place]  [address]       Ph: [phone] or [phone]       Fax: [phone]   RxID:   [id number] ZITHROMAX Z-PAK 250 MG TABS (AZITHROMYCIN) As directed  #1 x 0   Entered by:   Shelbie Hutching, RN   Authorized by:   Carolann Littler MD   Signed by:   Shelbie Hutching, RN on 09/04/2009   Method used:   Electronically to        [facility]  [phone]* (retail)       [address]       [place], [place]  [address]       Ph: [phone] or [phone]       Fax: [phone]   RxID:   [id number]

## 2010-05-07 NOTE — Progress Notes (Signed)
Summary: Pt has questions re: the samples of Exforge she was given  Phone Note Call from Patient Call back at Home Phone [phone]   Caller: Patient Call For: Carolann Littler MD Summary of Call: Pt rcvd samples of Exforge 5m-320mg,but  usually takes 577m160. Pt wants to know if it is safe to take the higher dosage? Initial call taken by: CaBraulio Bosch April 23, 2009 9:32 AM  Follow-up for Phone Call        Called pt, she states she was [facility] the samples [facility] 12[facility]2 [facility] She just ran out of her regular Rx and was going to begin samples. Follow-up by: NaNira ConnPN,  January 17, 2091000:14 AM  Additional Follow-up for Phone Call Additional follow up Details #1::        Yes,  I think this dose is safe as long as she does not have any orthostatic type of dizziness. Additional Follow-up by: BrCarolann LittlerD,  April 23, 2009 1:05 PM    Additional Follow-up for Phone Call Additional follow up Details #2::    Pt informed.  She does have a refill at her pharmacy for refills also Follow-up by: NaNira ConnPN,  January 17, 202628:33 PM

## 2010-05-07 NOTE — Assessment & Plan Note (Signed)
Summary: fu on bp/njr   Vital Signs:  Patient profile:   77 year old female Temp:     98.3 degrees F oral BP sitting:   158 / 78  (left arm) Cuff size:   large  Vitals Entered By: Nira Conn LPN (February 07, 5008 8:34 AM)  History of Present Illness: Here for high BP yesterday 169/79 and 180/80. Felt "terrible" for 3-4 weeks.  Some nausea and headaches. No vision changes.  Feeling more depressed for 3-4 week.  More freq crying spells. Poor sleep off and on.  More fatigue and decreased motivation. No suicidal ideation.  Anhedonia.  Hopelessness.  Glucose not well controlled.  Fasting over 150 generally.  Increased thirst issues.  Hypertension History:      She complains of dyspnea with exertion, but denies headache, chest pain, palpitations, orthopnea, peripheral edema, visual symptoms, neurologic problems, syncope, and side effects from treatment.  She notes no problems with any antihypertensive medication side effects.        Positive major cardiovascular risk factors include female age 55 years old or older, diabetes, hyperlipidemia, and hypertension.  Negative major cardiovascular risk factors include non-tobacco-user status.        Further assessment for target organ damage reveals no history of ASHD or peripheral vascular disease.     Allergies: 1)  ! Codeine Sulfate (Codeine Sulfate) 2)  Sulfa  Past History:  Past Medical History: Last updated: 11/20/2008 Diabetes mellitus, type II Hyperlipidemia Hypertension Hypothyroidism Osteoarthritis cerebrovascular disease/Sttroke 2008 chronic vertigo (hx AVN) left breast cancer 2007  Past Surgical History: Last updated: 11/20/2008 Rt Knee replacement 2009 Cholecystectomy 1980 Breast biopsy, cancer 2007  Family History: Last updated: 07/24/2008 Family History of Arthritis mother Breast cancer, both sisters heart disease, father  Social History: Last updated: 07/24/2008 Retired Married Never Smoked Alcohol  use-no  Risk Factors: Exercise: no (08/28/2009)  Risk Factors: Smoking Status: never (07/24/2008) PMH-FH-SH reviewed for relevance  Review of Systems  The patient denies chest pain, syncope, peripheral edema, prolonged cough, headaches, abdominal pain, melena, hematochezia, and severe indigestion/heartburn.    Physical Exam  General:  pt is alert and cooperative and tearfull off and on during interview. Eyes:  No corneal or conjunctival inflammation noted. EOMI. Perrla. Funduscopic exam benign, without hemorrhages, exudates or papilledema. Vision grossly normal. Mouth:  Oral mucosa and oropharynx without lesions or exudates.  Teeth in good repair. Neck:  No deformities, masses, or tenderness noted. Lungs:  Normal respiratory effort, chest expands symmetrically. Lungs are clear to auscultation, no crackles or wheezes. Heart:  normal rate and regular rhythm.   Extremities:  no edema or clubbing. Neurologic:  alert & oriented X3.   Psych:  good eye contact, depressed affect, and tearful.     Impression & Recommendations:  Problem # 1:  DEPRESSION (ICD-311) Assessment Deteriorated  Pt has increased depression and fatigue.  Given fatigue issues will try Wellbutrin. Her updated medication list for this problem includes:    Diazepam 5 Mg Tabs (Diazepam) ..... One daily as needed for vertigo    Wellbutrin Xl 300 Mg Xr24h-tab (Bupropion hcl) ..... One by mouth once daily  Orders: Prescription Created Electronically [phone])  Problem # 2:  HYPERTENSION (ICD-401.9) Assessment: Deteriorated  Increase Exforge to 10/160 mg Her updated medication list for this problem includes:    Exforge 10-160 Mg Tabs (Amlodipine besylate-valsartan) ..... One by mouth once daily    Atenolol 50 Mg Tabs (Atenolol) ..... One two times a day    Hydrochlorothiazide 25  Mg Tabs (Hydrochlorothiazide) ..... One tablet once daily  Orders: Prescription Created Electronically [phone])  Problem # 3:  DIABETES  MELLITUS, TYPE II (ICD-250.00)  Her updated medication list for this problem includes:    Exforge 10-160 Mg Tabs (Amlodipine besylate-valsartan) ..... One by mouth once daily    Glucophage Xr 500 Mg Xr24h-tab (Metformin hcl) .Marland Kitchen..Marland Kitchen Two tabs two times a day (total 1013m two times a day    Glucotrol 10 Mg Tabs (Glipizide) ..Marland Kitchen..Marland KitchenTwo times a day    Aspirin Adult Low Strength 81 Mg Tbec (Aspirin) ..... Once daily  Complete Medication List: 1)  Exforge 10-160 Mg Tabs (Amlodipine besylate-valsartan) .... One by mouth once daily 2)  Arimidex 1 Mg Tabs (Anastrozole) .... Once daily 3)  Atenolol 50 Mg Tabs (Atenolol) .... One two times a day 4)  Fosamax 35 Mg Tabs (Alendronate sodium) .... One by mouth weekly 5)  Citracal Plus Tabs (Multiple minerals-vitamins) .... 1,200 mg once daily 6)  Centrum Silver Ultra Womens Tabs (Multiple vitamins-minerals) .... Once daily 7)  Glucophage Xr 500 Mg Xr24h-tab (Metformin hcl) .... Two tabs two times a day (total 10025mtwo times a day 8)  Glucotrol 10 Mg Tabs (Glipizide) .... Two times a day 9)  Levothroid 125 Mcg Tabs (Levothyroxine sodium) .... Once daily 10)  Crestor 5 Mg Tabs (Rosuvastatin calcium) .... Once daily 11)  Vitamin B-12 100 Mcg Tabs (Cyanocobalamin) .... Once daily 12)  Cvs Vitamin B-6 200 Mg Tabs (Pyridoxine hcl) .... Once daily 13)  Drisdol 50000 Unit Caps (Ergocalciferol) .... Once daily 14)  Aspirin Adult Low Strength 81 Mg Tbec (Aspirin) .... Once daily 15)  Tandem Plus 162-115.2-1 Mg Caps (Fefum-fepo-fa-b cmp-c-zn-mn-cu) .... Once daily 16)  Hydrochlorothiazide 25 Mg Tabs (Hydrochlorothiazide) .... One tablet once daily 17)  Diazepam 5 Mg Tabs (Diazepam) .... One daily as needed for vertigo 18)  Metanx 3-35-2 Mg Tabs (L-methylfolate-b6-b12) .... Once daily 19)  Wellbutrin Xl 300 Mg Xr24h-tab (Bupropion hcl) .... One by mouth once daily  Other Orders: Flu Vaccine 3y35yr MEDICARE PATIENTS (Q2(B0175dministration Flu vaccine - MCR  (G0(Z0258Hypertension Assessment/Plan:      The patient's hypertensive risk group is category C: Target organ damage and/or diabetes.  Her calculated 10 year risk of coronary heart disease is 17 %.  Today's blood pressure is 158/78.  Her blood pressure goal is < 130/80.  Patient Instructions: 1)  Please schedule a follow-up appointment in 1 month.  2)  Check your  Blood Pressure regularly . If it is above:140/90   you should make an appointment. Prescriptions: WELLBUTRIN XL 300 MG XR24H-TAB (BUPROPION HCL) one by mouth once daily  #30 x 5   Entered and Authorized by:   BruCarolann Littler   Signed by:   BruCarolann Littler on 02/06/2010   Method used:   Electronically to        [phone])       [address]  [id number]    Ph: [phone]    Fax: [phone]:  :   [id number] 10-160 MG TABS (AMLODIPINE BESYLATE-VALSARTAN) one by mouth once daily  #30 x 11   Entered and Authorized by:   BruCarolann Littler   Signed by:   BruCarolann Littler on 02/06/2010   Method used:   Electronically to  [facility]  [phone]* (retail)       [place], [place]  [address]       Ph: [phone] or [phone]       Fax: [phone]   RxID:   [id number]    Orders Added: 1)  Est. Patient Level IV [85462] 2)  Flu Vaccine 37yr + MEDICARE PATIENTS [Q2039] 3)  Administration Flu vaccine - MCR [[V0350]4)  Prescription Created Electronically [[phone]  Flu Vaccine Consent Questions     Do you have a history of severe allergic reactions to this vaccine? no    Any prior history of allergic reactions to egg and/or gelatin? no    Do you have a sensitivity to the preservative Thimersol? no    Do you have a past history of Guillan-Barre Syndrome? no    Do you currently have an acute febrile illness? no    Have you ever had a  severe reaction to latex? no    Vaccine information given and explained to patient? yes    Are you currently pregnant? no    Lot Number:[id number]   Exp Date:10/05/2010   Site Given  Left Deltoid [id number]

## 2010-05-07 NOTE — Assessment & Plan Note (Signed)
Summary: 3 month rov/njr pt rsc/njr   Vital Signs:  Patient profile:   77 year old female Weight:      231 pounds Temp:     98.7 degrees F BP sitting:   150 / 80  (left arm) Cuff size:   large  Vitals Entered By: Nira Conn LPN (Aug 28, 1608 96:04 AM) CC: 3 month follow-up, cough, congestion, Hypertension Management Is Patient Diabetic? Yes Did you bring your meter with you today? No   History of Present Illness: Patient for follow up medical problems. Type 2 diabetes fairly well controlled. A1c 7.2%. Blood sugars usually 120-140 fasting at home. Eye exam due in June.  Hypertension treated with multiple medications. Compliant with therapy. No side effects. Blood pressure is very well controlled at home.  One-week history of intermittent dry cough. Postnasal drainage symptoms. Netti pot with minimal improvement. She thinks this may be allergy related. Denies any associated fever or chills.  Nonsmoker.  Diabetes Management History:      She has not been enrolled in the "Diabetic Education Program".  She states understanding of dietary principles but she is not following the appropriate diet.  No sensory loss is reported.  Self foot exams are being performed.  She is not checking home blood sugars.  She says that she is not exercising regularly.        Hypoglycemic symptoms are not occurring.  No hyperglycemic symptoms are reported.        There are no symptoms to suggest diabetic complications.  No changes have been made to her treatment plan since last visit.    Hypertension History:      She denies headache, chest pain, palpitations, dyspnea with exertion, orthopnea, PND, peripheral edema, visual symptoms, neurologic problems, syncope, and side effects from treatment.        Positive major cardiovascular risk factors include female age 87 years old or older, diabetes, hyperlipidemia, and hypertension.  Negative major cardiovascular risk factors include non-tobacco-user status.      Preventive Screening-Counseling & Management  Caffeine-Diet-Exercise     Does Patient Exercise: no  Allergies: 1)  ! Codeine Sulfate (Codeine Sulfate) 2)  Sulfa  Past History:  Past Medical History: Last updated: 11/20/2008 Diabetes mellitus, type II Hyperlipidemia Hypertension Hypothyroidism Osteoarthritis cerebrovascular disease/Sttroke 2008 chronic vertigo (hx AVN) left breast cancer 2007  Past Surgical History: Last updated: 11/20/2008 Rt Knee replacement 2009 Cholecystectomy 1980 Breast biopsy, cancer 2007  Family History: Last updated: 07/24/2008 Family History of Arthritis mother Breast cancer, both sisters heart disease, father  Social History: Last updated: 07/24/2008 Retired Married Never Smoked Alcohol use-no  Risk Factors: Smoking Status: never (07/24/2008) PMH-FH-SH reviewed for relevance  Social History: Does Patient Exercise:  no  Review of Systems  The patient denies anorexia, fever, weight loss, chest pain, syncope, dyspnea on exertion, peripheral edema, prolonged cough, headaches, abdominal pain, melena, hematochezia, and depression.    Physical Exam  General:  Well-developed,well-nourished,in no acute distress; alert,appropriate and cooperative throughout examination Head:  Normocephalic and atraumatic without obvious abnormalities. No apparent alopecia or balding. Eyes:  pupils equal, pupils round, and pupils reactive to light.   Ears:  External ear exam shows no significant lesions or deformities.  Otoscopic examination reveals clear canals, tympanic membranes are intact bilaterally without bulging, retraction, inflammation or discharge. Hearing is grossly normal bilaterally. Mouth:  Oral mucosa and oropharynx without lesions or exudates.  Teeth in good repair. Neck:  No deformities, masses, or tenderness noted. Lungs:  Normal respiratory effort,  chest expands symmetrically. Lungs are clear to auscultation, no crackles or  wheezes. Heart:  normal rate and regular rhythm.   Extremities:  see foot exam.  No leg edema. Neurologic:  alert & oriented X3, cranial nerves II-XII intact, and strength normal in all extremities.   Skin:  no rashes. Cervical Nodes:  No lymphadenopathy noted Psych:  normally interactive, good eye contact, not anxious appearing, and not depressed appearing.    Diabetes Management Exam:    Foot Exam (with socks and/or shoes not present):       Sensory-Pinprick/Light touch:          Left medial foot (L-4): normal          Left dorsal foot (L-5): normal          Left lateral foot (S-1): normal          Right medial foot (L-4): normal          Right dorsal foot (L-5): normal          Right lateral foot (S-1): normal       Sensory-Monofilament:          Left foot: normal          Right foot: normal       Inspection:          Left foot: normal          Right foot: normal       Nails:          Left foot: normal          Right foot: normal    Eye Exam:       Eye Exam done elsewhere          Date: 09/05/2008          Results: normal   Impression & Recommendations:  Problem # 1:  HYPERTENSION (ICD-401.9) stable by home readings but up slightly today. Her updated medication list for this problem includes:    Exforge 5-160 Mg Tabs (Amlodipine besylate-valsartan) ..... Once daily    Atenolol 50 Mg Tabs (Atenolol) ..... One two times a day    Hydrochlorothiazide 25 Mg Tabs (Hydrochlorothiazide) ..... One tablet once daily  Orders: Prescription Created Electronically [phone])  Problem # 2:  DIABETES MELLITUS, TYPE II (ICD-250.00) Near goal with A1C of 7.2%.  Work on weight loss. Her updated medication list for this problem includes:    Exforge 5-160 Mg Tabs (Amlodipine besylate-valsartan) ..... Once daily    Glucophage Xr 500 Mg Xr24h-tab (Metformin hcl) .Marland Kitchen..Marland Kitchen Two tabs two times a day (total 1046m two times a day    Glucotrol 10 Mg Tabs (Glipizide) ..Marland Kitchen..Marland KitchenTwo times a day    Aspirin  Adult Low Strength 81 Mg Tbec (Aspirin) ..... Once daily  Problem # 3:  Preventive Health Care (ICD-V70.0) cannot confirm prior pneumonia vaccine. We'll give Pneumovax today. Patient sees gynecologist regularly.  Problem # 4:  COUGH (ICD-786.2) suspect secondary to allergic postnasal drip symptoms  Complete Medication List: 1)  Exforge 5-160 Mg Tabs (Amlodipine besylate-valsartan) .... Once daily 2)  Arimidex 1 Mg Tabs (Anastrozole) .... Once daily 3)  Atenolol 50 Mg Tabs (Atenolol) .... One two times a day 4)  Fosamax 35 Mg Tabs (Alendronate sodium) .... One by mouth weekly 5)  Citracal Plus Tabs (Multiple minerals-vitamins) .... 1,200 mg once daily 6)  Centrum Silver Ultra Womens Tabs (Multiple vitamins-minerals) .... Once daily 7)  Glucophage Xr 500 Mg Xr24h-tab (Metformin hcl) .... Two tabs  two times a day (total 1083m two times a day 8)  Glucotrol 10 Mg Tabs (Glipizide) .... Two times a day 9)  Levothroid 125 Mcg Tabs (Levothyroxine sodium) .... Once daily 10)  Crestor 5 Mg Tabs (Rosuvastatin calcium) .... Once daily 11)  Vitamin B-12 100 Mcg Tabs (Cyanocobalamin) .... Once daily 12)  Cvs Vitamin B-6 200 Mg Tabs (Pyridoxine hcl) .... Once daily 13)  Drisdol 50000 Unit Caps (Ergocalciferol) .... Once daily 14)  Aspirin Adult Low Strength 81 Mg Tbec (Aspirin) .... Once daily 15)  Tandem Plus 162-115.2-1 Mg Caps (Fefum-fepo-fa-b cmp-c-zn-mn-cu) .... Once daily 16)  Hydrochlorothiazide 25 Mg Tabs (Hydrochlorothiazide) .... One tablet once daily 17)  Diazepam 5 Mg Tabs (Diazepam) .... One daily as needed for vertigo 18)  Metanx 3-35-2 Mg Tabs (L-methylfolate-b6-b12) .... Once daily  Other Orders: Pneumococcal Vaccine ((15183 Admin 1st Vaccine ((43735  Hypertension Assessment/Plan:      The patient's hypertensive risk group is category C: Target organ damage and/or diabetes.  Her calculated 10 year risk of coronary heart disease is 17 %.  Today's blood pressure is 150/80.     Patient Instructions: 1)  Please schedule a follow-up appointment in 3 months .  2)  You need to lose weight. Consider a lower calorie diet and regular exercise.  3)  Check your blood sugars regularly. If your readings are usually above: 140 or below 70 you should contact our office.  4)  It is important that your diabetic A1c level is checked every 3 months.  5)  See your eye doctor yearly to check for diabetic eye damage. 6)  Check your feet each night  for sore areas, calluses or signs of infection.  Prescriptions: EXFORGE 5-160 MG TABS (AMLODIPINE BESYLATE-VALSARTAN) once daily  #30 x 11   Entered and Authorized by:   BCarolann LittlerMD   Signed by:   BCarolann LittlerMD on 08/28/2009   Method used:   Electronically to        [facility] #[phone] (retail)       [address]      [place]  [id number]      Ph: [phone]      Fax: [phone]  RxID:   [id number]  Preventive Care Screening  Mammogram:    Date:  06/05/2009    Results:  normal     Immunizations Administered:  Pneumonia Vaccine:    Vaccine Type: Pneumovax    Site: right deltoid    Mfr: Merck    Dose: 0.5 ml    Route: IM    Given by: NNira ConnLPN    Exp. Date: 01/30/2011    Lot #: [id number]

## 2010-05-07 NOTE — Medication Information (Signed)
Summary: Order for Diabetic Shoes  Order for Diabetic Shoes   Imported By: Laural Benes 01/02/2010 10:42:23  _____________________________________________________________________  External Attachment:    Type:   Image     Comment:   External Document

## 2010-05-09 NOTE — Assessment & Plan Note (Signed)
Summary: leg edema/dm   Vital Signs:  Patient profile:   77 year old female Weight:      221 pounds Temp:     98.4 degrees F oral BP sitting:   162 / 82  (left arm) Cuff size:   large  Vitals Entered By: Nira Conn LPN (March 25, 5952 3:36 PM)  History of Present Illness: Patient seen with slowly progressive lower leg edema. Somewhat poorly controlled blood pressure on Exforge 10/160 and recent addition of Bystolic.  She is convinced that beta- blockers causing increased edema. We had a few months ago increased her dose of amlodipine. Patient uses support hose inconsistently. No history of CHF. No dyspnea.  Patient had hyponatremia on hydrochlorothiazide back in early November. Symptoms cleared after  after discontinuation of hctz.  She has slightly liberalized intake of Na. No regular use of NSAIDS.  Diabetes stable with no symptoms of hyperglycemia.  Hypertension History:      She complains of peripheral edema, but denies headache, chest pain, palpitations, dyspnea with exertion, orthopnea, PND, visual symptoms, neurologic problems, and syncope.        Positive major cardiovascular risk factors include female age 28 years old or older, diabetes, hyperlipidemia, and hypertension.  Negative major cardiovascular risk factors include non-tobacco-user status.        Further assessment for target organ damage reveals no history of ASHD or peripheral vascular disease.     Allergies: 1)  ! Codeine Sulfate (Codeine Sulfate) 2)  Sulfa  Past History:  Past Medical History: Last updated: 11/20/2008 Diabetes mellitus, type II Hyperlipidemia Hypertension Hypothyroidism Osteoarthritis cerebrovascular disease/Sttroke 2008 chronic vertigo (hx AVN) left breast cancer 2007  Past Surgical History: Last updated: 11/20/2008 Rt Knee replacement 2009 Cholecystectomy 1980 Breast biopsy, cancer 2007  Family History: Last updated: 07/24/2008 Family History of Arthritis mother Breast  cancer, both sisters heart disease, father  Social History: Last updated: 07/24/2008 Retired Married Never Smoked Alcohol use-no  Risk Factors: Exercise: no (08/28/2009)  Risk Factors: Smoking Status: never (07/24/2008) PMH-FH-SH reviewed for relevance  Review of Systems       The patient complains of peripheral edema.  The patient denies anorexia, fever, weight loss, hoarseness, chest pain, syncope, dyspnea on exertion, headaches, and abdominal pain.    Physical Exam  General:  Well-developed,well-nourished,in no acute distress; alert,appropriate and cooperative throughout examination Head:  normocephalic and atraumatic.   Eyes:  pupils equal, pupils round, and pupils reactive to light.   Neck:  No deformities, masses, or tenderness noted. Lungs:  Normal respiratory effort, chest expands symmetrically. Lungs are clear to auscultation, no crackles or wheezes. Heart:  normal rate and regular rhythm.   Extremities:  patient has 1+ pitting edema lower legs ankles and feet bilaterally Neurologic:  alert & oriented X3.   Psych:  normally interactive, good eye contact, not anxious appearing, and not depressed appearing.     Impression & Recommendations:  Problem # 1:  HYPERTENSION (ICD-401.9) Assessment Deteriorated Change from Exforge to Azor (hopefully decreased dose of amlodipine with help her edema).  Cont Bystolic and explained this is not likely causing and major edema. The following medications were removed from the medication list:    Exforge 10-160 Mg Tabs (Amlodipine besylate-valsartan) ..... One by mouth once daily Her updated medication list for this problem includes:    Bystolic 5 Mg Tabs (Nebivolol hcl) ..... One by mouth once daily    Azor 5-20 Mg Tabs (Amlodipine-olmesartan) ..... One by mouth once daily    Furosemide  20 Mg Tabs (Furosemide) ..... One by mouth once daily  Problem # 2:  EDEMA (ICD-782.3) cont support hose and cautious use of low dose furosemide  with close monitoring of Na and K.  BMP next week. Her updated medication list for this problem includes:    Furosemide 20 Mg Tabs (Furosemide) ..... One by mouth once daily  Problem # 3:  DEPRESSION (ICD-311)  Her updated medication list for this problem includes:    Diazepam 5 Mg Tabs (Diazepam) ..... One daily as needed for vertigo    Wellbutrin Xl 300 Mg Xr24h-tab (Bupropion hcl) ..... One by mouth once daily  Problem # 4:  DIABETES MELLITUS, TYPE II (ICD-250.00)  The following medications were removed from the medication list:    Exforge 10-160 Mg Tabs (Amlodipine besylate-valsartan) ..... One by mouth once daily Her updated medication list for this problem includes:    Glucophage Xr 500 Mg Xr24h-tab (Metformin hcl) .Marland Kitchen..Marland Kitchen Two tabs two times a day (total 1078m two times a day    Glucotrol 10 Mg Tabs (Glipizide) ..Marland Kitchen..Marland KitchenTwo times a day    Aspirin Adult Low Strength 81 Mg Tbec (Aspirin) ..... Once daily    Azor 5-20 Mg Tabs (Amlodipine-olmesartan) ..... One by mouth once daily  Complete Medication List: 1)  Arimidex 1 Mg Tabs (Anastrozole) .... Once daily 2)  Fosamax 35 Mg Tabs (Alendronate sodium) .... One by mouth weekly 3)  Citracal Plus Tabs (Multiple minerals-vitamins) .... 1,200 mg once daily 4)  Centrum Silver Ultra Womens Tabs (Multiple vitamins-minerals) .... Once daily 5)  Glucophage Xr 500 Mg Xr24h-tab (Metformin hcl) .... Two tabs two times a day (total 10045mtwo times a day 6)  Glucotrol 10 Mg Tabs (Glipizide) .... Two times a day 7)  Levothroid 125 Mcg Tabs (Levothyroxine sodium) .... Once daily 8)  Crestor 5 Mg Tabs (Rosuvastatin calcium) .... Once daily 9)  Vitamin B-12 100 Mcg Tabs (Cyanocobalamin) .... Once daily 10)  Cvs Vitamin B-6 200 Mg Tabs (Pyridoxine hcl) .... Once daily 11)  Drisdol 50000 Unit Caps (Ergocalciferol) .... Once daily 12)  Aspirin Adult Low Strength 81 Mg Tbec (Aspirin) .... Once daily 13)  Tandem Plus 162-115.2-1 Mg Caps (Fefum-fepo-fa-b  cmp-c-zn-mn-cu) .... Once daily 14)  Diazepam 5 Mg Tabs (Diazepam) .... One daily as needed for vertigo 15)  Metanx 3-35-2 Mg Tabs (L-methylfolate-b6-b12) .... Once daily 16)  Wellbutrin Xl 300 Mg Xr24h-tab (Bupropion hcl) .... One by mouth once daily 17)  Bystolic 5 Mg Tabs (Nebivolol hcl) .... One by mouth once daily 18)  Azor 5-20 Mg Tabs (Amlodipine-olmesartan) .... One by mouth once daily 19)  Furosemide 20 Mg Tabs (Furosemide) .... One by mouth once daily  Hypertension Assessment/Plan:      The patient's hypertensive risk group is category C: Target organ damage and/or diabetes.  Her calculated 10 year risk of coronary heart disease is 20 %.  Today's blood pressure is 162/82.  Her blood pressure goal is < 130/80.  Patient Instructions: 1)  STOP Exforge. 2)  Start Azor one daily 3)  Start furosemide 20 mg one daily. 4)  Eat more potassium rich foods such as bananas, oranje juice, and salt substitutes .  5)  schedule followup appointment in one week Prescriptions: FUROSEMIDE 20 MG TABS (FUROSEMIDE) one by mouth once daily  #30 x 0   Entered and Authorized by:   BrCarolann LittlerD   Signed by:   BrCarolann LittlerD on 03/25/2010   Method used:   Electronically to  [facility]  [phone]* (retail)       [address]       [place], [place]  [address]       Ph: [phone] or [phone]       Fax: [phone]   RxID:   [id number]    Orders Added: 1)  Est. Patient Level IV [72550]

## 2010-05-09 NOTE — Progress Notes (Signed)
Summary: Concerned about elevated BP  Phone Note Call from Patient   Caller: Patient Call For: Carolann Littler MD Summary of Call: Pt wants to speak to Dr. Elease Hashimoto regarding her BP.   Her BP is still up, and she cannot function...Marland KitchenMarland KitchenMarland Kitchenfeels nervous and weak. [phone]  This 148/82, but last night it was 189/90.  Explained to her BP fluctuates depending on how we are feeling and now anxious we are.  Maybe she should stop taking her BP so much and she agrees.  Anymore suggestions? Initial call taken by: Deanna Artis CMA AAMA,  April 05, 2010 8:36 AM  Follow-up for Phone Call        Agree with advice.  Also, I would go ahead and increase her Bystolic to 10 mg daily at this time Follow-up by: Carolann Littler MD,  April 05, 2010 1:06 PM  Additional Follow-up for Phone Call Additional follow up Details #1::        Notified pt. Additional Follow-up by: Debby Meadows CMA AAMA,  April 05, 2010 1:10 PM    New/Updated Medications: BYSTOLIC 10 MG TABS (NEBIVOLOL HCL) One by mouth daily

## 2010-05-09 NOTE — Letter (Signed)
Summary: [place]   Imported By: Rise Patience 03/27/2010 17:12:49  _____________________________________________________________________  External Attachment:    Type:   Image     Comment:   External Document

## 2010-05-09 NOTE — Progress Notes (Signed)
Summary: new rx needed, bystolic 23NT  Phone Note Call from Patient Call back at Home Phone [phone]   Caller: Patient---live call Summary of Call: was told to double her Bystolic 5 mg to two times a day. she is out of meds and would a new rx called to [facility] [address]. Initial call taken by: Despina Arias,  April 29, 2010 9:42 AM  Follow-up for Phone Call        let's refill her Bystolic at 10 mg one daily with 5 refills Follow-up by: Carolann Littler MD,  April 29, 2010 10:40 AM    Prescriptions: BYSTOLIC 10 MG TABS (NEBIVOLOL HCL) One by mouth daily  #30 x 5   Entered by:   Nira Conn LPN   Authorized by:   Carolann Littler MD   Signed by:   Nira Conn LPN on 86/76/1948   Method used:   Electronically to        [facility]  [phone]* (retail)       [address]       [place], [place]  [address]       Ph: [phone] or [phone]       Fax: [phone]   RxID:   [id number]

## 2010-05-09 NOTE — Assessment & Plan Note (Signed)
Summary: NAUSEA/LEFT SIDE PAIN/NJR   Vital Signs:  Patient profile:   77 year old female Temp:     97.7 degrees F oral BP sitting:   140 / 80  (left arm) Cuff size:   large  Vitals Entered By: Nira Conn LPN (April 18, 8099 12:03 PM)  History of Present Illness: Patient here with nausea for possibly one week. Some decrease in appetite. No vomiting. Also some vague left mid to lower quadrant discomfort intermittently. No exacerbating factors. Denies any stool changes. No fever. Occasional chills. No history of diverticulitis. No dysuria.  Discomfort is mild. No aggravating or alleviating factors.  Recent poorly controlled blood pressure. Increased bystolic to 10 mg daily. She is tolerating medicines well for the  most part. Home Bps reviewed and improved and mostly controlled.  Allergies: 1)  ! Codeine Sulfate (Codeine Sulfate) 2)  Sulfa  Past History:  Past Medical History: Last updated: 11/20/2008 Diabetes mellitus, type II Hyperlipidemia Hypertension Hypothyroidism Osteoarthritis cerebrovascular disease/Sttroke 2008 chronic vertigo (hx AVN) left breast cancer 2007  Past Surgical History: Last updated: 11/20/2008 Rt Knee replacement 2009 Cholecystectomy 1980 Breast biopsy, cancer 2007  Family History: Last updated: 07/24/2008 Family History of Arthritis mother Breast cancer, both sisters heart disease, father  Social History: Last updated: 07/24/2008 Retired Married Never Smoked Alcohol use-no  Risk Factors: Exercise: no (08/28/2009)  Risk Factors: Smoking Status: never (07/24/2008) PMH-FH-SH reviewed for relevance  Review of Systems       The patient complains of anorexia.  The patient denies fever, weight loss, hoarseness, chest pain, syncope, dyspnea on exertion, peripheral edema, prolonged cough, headaches, hemoptysis, melena, hematochezia, and depression.    Physical Exam  General:  Well-developed,well-nourished,in no acute distress;  alert,appropriate and cooperative throughout examination Ears:  External ear exam shows no significant lesions or deformities.  Otoscopic examination reveals clear canals, tympanic membranes are intact bilaterally without bulging, retraction, inflammation or discharge. Hearing is grossly normal bilaterally. Mouth:  Oral mucosa and oropharynx without lesions or exudates.  Teeth in good repair. Neck:  No deformities, masses, or tenderness noted. Lungs:  Normal respiratory effort, chest expands symmetrically. Lungs are clear to auscultation, no crackles or wheezes. Heart:  Normal rate and regular rhythm. S1 and S2 normal without gallop, murmur, click, rub or other extra sounds. Abdomen:  soft, non-tender, normal bowel sounds, no distention, no masses, no guarding, no rigidity, no hepatomegaly, and no splenomegaly.   Skin:  no rashes.     Impression & Recommendations:  Problem # 1:  HYPERTENSION (ICD-401.9) Assessment Improved  Her updated medication list for this problem includes:    Bystolic 10 Mg Tabs (Nebivolol hcl) ..... One by mouth daily    Azor 5-20 Mg Tabs (Amlodipine-olmesartan) ..... One by mouth once daily    Furosemide 20 Mg Tabs (Furosemide) ..... One by mouth once daily  Problem # 2:  NAUSEA (ICD-787.02)  rule out recurrent hyponatremia.  Orders: Venipuncture (75102) Specimen Handling (99000) TLB-BMP (Basic Metabolic Panel-BMET) (58527-POEUMPN)  Problem # 3:  ABDOMINAL PAIN, LEFT LOWER QUADRANT (ICD-789.04)  no history of diverticulitis. No constipation history. Check CBC and urinalysis. currently not tender on exam.  Orders: TLB-CBC Platelet - w/Differential (85025-CBCD) UA Dipstick w/o Micro (automated)  (81003) Venipuncture (36144) Specimen Handling (99000)  Complete Medication List: 1)  Arimidex 1 Mg Tabs (Anastrozole) .... Once daily 2)  Fosamax 35 Mg Tabs (Alendronate sodium) .... One by mouth weekly 3)  Citracal Plus Tabs (Multiple minerals-vitamins) ....  1,200 mg once daily 4)  Centrum  Silver Ultra Womens Tabs (Multiple vitamins-minerals) .... Once daily 5)  Glucophage Xr 500 Mg Xr24h-tab (Metformin hcl) .... Two tabs two times a day (total 1017m two times a day 6)  Glucotrol 10 Mg Tabs (Glipizide) .... Two times a day 7)  Levothroid 125 Mcg Tabs (Levothyroxine sodium) .... Once daily 8)  Crestor 5 Mg Tabs (Rosuvastatin calcium) .... Once daily 9)  Vitamin B-12 100 Mcg Tabs (Cyanocobalamin) .... Once daily 10)  Cvs Vitamin B-6 200 Mg Tabs (Pyridoxine hcl) .... Once daily 11)  Drisdol 50000 Unit Caps (Ergocalciferol) .... Once daily 12)  Aspirin Adult Low Strength 81 Mg Tbec (Aspirin) .... Once daily 13)  Tandem Plus 162-115.2-1 Mg Caps (Fefum-fepo-fa-b cmp-c-zn-mn-cu) .... Once daily 14)  Diazepam 5 Mg Tabs (Diazepam) .... One daily as needed for vertigo 15)  Metanx 3-35-2 Mg Tabs (L-methylfolate-b6-b12) .... Once daily 16)  Bystolic 10 Mg Tabs (Nebivolol hcl) .... One by mouth daily 17)  Azor 5-20 Mg Tabs (Amlodipine-olmesartan) .... One by mouth once daily 18)  Furosemide 20 Mg Tabs (Furosemide) .... One by mouth once daily  Patient Instructions: 1)  Followup immediately for any fever or worsening abdominal pain   Orders Added: 1)  TLB-CBC Platelet - w/Differential [85025-CBCD] 2)  UA Dipstick w/o Micro (automated)  [81003] 3)  Venipuncture [[46803]4)  Specimen Handling [99000] 5)  TLB-BMP (Basic Metabolic Panel-BMET) [[21224-MGNOIBB]6)  Est. Patient Level IV [[04888]

## 2010-05-09 NOTE — Progress Notes (Signed)
Summary: leg edema  Phone Note Call from Patient Call back at Home Phone [phone]   Caller: Patient Call For: Carolann Littler MD Summary of Call: Pt is taking Bystolic 5 mg., and feels it is giving her bilateral leg edema, and cannot continue taking it.  Please call or schedule office appt.  Initial call taken by: [facility] CMA AAMA,  March 25, 2010 8:59 AM  Follow-up for Phone Call        schedule office follow up Follow-up by: Carolann Littler MD,  March 25, 2010 9:01 AM  Additional Follow-up for Phone Call Additional follow up Details #1::        scheduled visit. Additional Follow-up by: Deanna Artis CMA AAMA,  March 25, 2010 9:08 AM

## 2010-05-09 NOTE — Assessment & Plan Note (Signed)
Summary: 1 wk rov/njr   Vital Signs:  Patient profile:   77 year old female Weight:      216 pounds Temp:     98.0 degrees F oral BP sitting:   158 / 90  (left arm) Cuff size:   large  Vitals Entered By: Nira Conn LPN (April 04, 2329 10:27 AM)  History of Present Illness: Edema greatly improved with change of meds.   BP is stable at home.  We changed from Exforge to Azor and  added low dose Bystolic.  She feels better overall.  Does have tremor 4 day duration R hand with no assoc weakness. No speech changes or any other focal neuro symtptoms. No cognitive changes or any focal weakness.  Diabetes stable by home readings.  No hypoglycemia.  Diabetes Management History:      She has not been enrolled in the "Diabetic Education Program".  She states understanding of dietary principles but she is not following the appropriate diet.  Self foot exams are being performed.  She is not checking home blood sugars.  She says that she is not exercising regularly.        Hypoglycemic symptoms are not occurring.  No hyperglycemic symptoms are reported.        No changes have been made to her treatment plan since last visit.    Allergies: 1)  ! Codeine Sulfate (Codeine Sulfate) 2)  Sulfa  Past History:  Past Medical History: Last updated: 11/20/2008 Diabetes mellitus, type II Hyperlipidemia Hypertension Hypothyroidism Osteoarthritis cerebrovascular disease/Sttroke 2008 chronic vertigo (hx AVN) left breast cancer 2007  Past Surgical History: Last updated: 11/20/2008 Rt Knee replacement 2009 Cholecystectomy 1980 Breast biopsy, cancer 2007  Family History: Last updated: 07/24/2008 Family History of Arthritis mother Breast cancer, both sisters heart disease, father  Social History: Last updated: 07/24/2008 Retired Married Never Smoked Alcohol use-no  Risk Factors: Exercise: no (08/28/2009)  Risk Factors: Smoking Status: never (07/24/2008) PMH-FH-SH reviewed  for relevance  Review of Systems  The patient denies fever, weight loss, hoarseness, chest pain, syncope, dyspnea on exertion, peripheral edema, prolonged cough, headaches, hemoptysis, abdominal pain, melena, hematochezia, and severe indigestion/heartburn.    Physical Exam  General:  Well-developed,well-nourished,in no acute distress; alert,appropriate and cooperative throughout examination Head:  Normocephalic and atraumatic without obvious abnormalities. No apparent alopecia or balding. Eyes:  No corneal or conjunctival inflammation noted. EOMI. Perrla. Funduscopic exam benign, without hemorrhages, exudates or papilledema. Vision grossly normal. Mouth:  Oral mucosa and oropharynx without lesions or exudates.  Teeth in good repair. Neck:  No deformities, masses, or tenderness noted. Lungs:  Normal respiratory effort, chest expands symmetrically. Lungs are clear to auscultation, no crackles or wheezes. Heart:  normal rate and regular rhythm.   Extremities:  no edema. Cervical Nodes:  No lymphadenopathy noted Psych:  normally interactive, good eye contact, not anxious appearing, and not depressed appearing.     Impression & Recommendations:  Problem # 1:  HYPONATREMIA (ICD-276.1)  Orders: TLB-BMP (Basic Metabolic Panel-BMET) (07622-QJFHLKT)  Problem # 2:  HYPERTENSION (ICD-401.9)  Her updated medication list for this problem includes:    Bystolic 5 Mg Tabs (Nebivolol hcl) ..... One by mouth once daily    Azor 5-20 Mg Tabs (Amlodipine-olmesartan) ..... One by mouth once daily    Furosemide 20 Mg Tabs (Furosemide) ..... One by mouth once daily  Orders: Specimen Handling (99000) Venipuncture (62563)  Problem # 3:  DIABETES MELLITUS, TYPE II (ICD-250.00)  Her updated medication list for this problem includes:  Glucophage Xr 500 Mg Xr24h-tab (Metformin hcl) .Marland Kitchen..Marland Kitchen Two tabs two times a day (total 1050m two times a day    Glucotrol 10 Mg Tabs (Glipizide) ..Marland Kitchen..Marland KitchenTwo times a day     Aspirin Adult Low Strength 81 Mg Tbec (Aspirin) ..... Once daily    Azor 5-20 Mg Tabs (Amlodipine-olmesartan) ..... One by mouth once daily  Orders: Specimen Handling (99000) Venipuncture ((58099 TLB-A1C / Hgb A1C (Glycohemoglobin) (83036-A1C)  Problem # 4:  HYPERLIPIDEMIA (ICD-272.4) Assessment: Unchanged  Her updated medication list for this problem includes:    Crestor 5 Mg Tabs (Rosuvastatin calcium) ..... Once daily  Orders: Specimen Handling (99000) Venipuncture ((83382 TLB-Lipid Panel (80061-LIPID) TLB-Hepatic/Liver Function Pnl (80076-HEPATIC)  Complete Medication List: 1)  Arimidex 1 Mg Tabs (Anastrozole) .... Once daily 2)  Fosamax 35 Mg Tabs (Alendronate sodium) .... One by mouth weekly 3)  Citracal Plus Tabs (Multiple minerals-vitamins) .... 1,200 mg once daily 4)  Centrum Silver Ultra Womens Tabs (Multiple vitamins-minerals) .... Once daily 5)  Glucophage Xr 500 Mg Xr24h-tab (Metformin hcl) .... Two tabs two times a day (total 10063mtwo times a day 6)  Glucotrol 10 Mg Tabs (Glipizide) .... Two times a day 7)  Levothroid 125 Mcg Tabs (Levothyroxine sodium) .... Once daily 8)  Crestor 5 Mg Tabs (Rosuvastatin calcium) .... Once daily 9)  Vitamin B-12 100 Mcg Tabs (Cyanocobalamin) .... Once daily 10)  Cvs Vitamin B-6 200 Mg Tabs (Pyridoxine hcl) .... Once daily 11)  Drisdol 50000 Unit Caps (Ergocalciferol) .... Once daily 12)  Aspirin Adult Low Strength 81 Mg Tbec (Aspirin) .... Once daily 13)  Tandem Plus 162-115.2-1 Mg Caps (Fefum-fepo-fa-b cmp-c-zn-mn-cu) .... Once daily 14)  Diazepam 5 Mg Tabs (Diazepam) .... One daily as needed for vertigo 15)  Metanx 3-35-2 Mg Tabs (L-methylfolate-b6-b12) .... Once daily 16)  Bystolic 5 Mg Tabs (Nebivolol hcl) .... One by mouth once daily 17)  Azor 5-20 Mg Tabs (Amlodipine-olmesartan) .... One by mouth once daily 18)  Furosemide 20 Mg Tabs (Furosemide) .... One by mouth once daily  Diabetes Management Assessment/Plan:      The  following lipid goals have been established for the patient: Total cholesterol goal of 200; LDL cholesterol goal of 100; HDL cholesterol goal of 40; Triglyceride goal of 150.  Her blood pressure goal is < 130/80.    Patient Instructions: 1)  Please schedule a follow-up appointment in 2 months.   Prescriptions: BYSTOLIC 5 MG TABS (NEBIVOLOL HCL) one by mouth once daily  #30 x 11   Entered and Authorized by:   BrCarolann LittlerD   Signed by:   BrCarolann LittlerD on 04/04/2010   Method used:   [facility] to        [facility][phone](retail)       [address]     [place]  [id number]     Ph: [phone]     Fax: [phone] RxID:   [id number]  Orders Added: 1)  Est. Patient Level IV [9[14481])  Specimen Handling [99000] 3)  Venipuncture [3[85631])  TLB-Lipid Panel [80061-LIPID] 5)  TLB-Hepatic/Liver Function Pnl [80076-HEPATIC] 6)  TLB-BMP (Basic Metabolic Panel-BMET) [8[49702-OVZCHYI])  TLB-A1C / Hgb A1C (Glycohemoglobin) [8[50277-A1O]

## 2010-05-09 NOTE — Progress Notes (Signed)
Summary: Question about labs, refill furosemide  Phone Note Outgoing Call   Call placed by: Nira Conn LPN,  April 20, 8839 11:33 AM Call placed to: Patient Summary of Call: Pt informed of labs done yesterday.  1.)  Pt has labs scheduled for 1/27.  Does she need those labs done, or cancel? 2.)  Pt has 3 Furosemide left, no refills.  Is she to continue to take daily? [facility] Initial call taken by: Nira Conn LPN,  April 19, 6604 11:34 AM  Follow-up for Phone Call        yes.  cont daily. May cancel labs for 1/27. Follow-up by: Carolann Littler MD,  April 19, 2010 12:37 PM  Additional Follow-up for Phone Call Additional follow up Details #1::        Lab appt cancelled, will fill med, pt aware    Prescriptions: FUROSEMIDE 20 MG TABS (FUROSEMIDE) one by mouth once daily  #30 x 6   Entered by:   Nira Conn LPN   Authorized by:   Carolann Littler MD   Signed by:   Nira Conn LPN on 30/16/0109   Method used:   Electronically to        [facility]  [phone]* (retail)       [address]       [place], [place]  [address]       Ph: [phone] or [phone]       Fax: [phone]   RxID:   [id number]

## 2010-05-13 ENCOUNTER — Other Ambulatory Visit: Payer: Self-pay | Admitting: Family Medicine

## 2010-05-13 DIAGNOSIS — E871 Hypo-osmolality and hyponatremia: Secondary | ICD-10-CM

## 2010-05-27 ENCOUNTER — Other Ambulatory Visit: Payer: Self-pay | Admitting: Family Medicine

## 2010-05-28 ENCOUNTER — Ambulatory Visit: Payer: Self-pay | Admitting: Family Medicine

## 2010-06-04 ENCOUNTER — Ambulatory Visit (INDEPENDENT_AMBULATORY_CARE_PROVIDER_SITE_OTHER): Payer: Medicare Other | Admitting: Family Medicine

## 2010-06-04 ENCOUNTER — Encounter: Payer: Self-pay | Admitting: Family Medicine

## 2010-06-04 DIAGNOSIS — R5381 Other malaise: Secondary | ICD-10-CM

## 2010-06-04 DIAGNOSIS — E871 Hypo-osmolality and hyponatremia: Secondary | ICD-10-CM

## 2010-06-04 DIAGNOSIS — E119 Type 2 diabetes mellitus without complications: Secondary | ICD-10-CM

## 2010-06-04 DIAGNOSIS — I10 Essential (primary) hypertension: Secondary | ICD-10-CM

## 2010-06-04 DIAGNOSIS — E039 Hypothyroidism, unspecified: Secondary | ICD-10-CM

## 2010-06-04 DIAGNOSIS — R5383 Other fatigue: Secondary | ICD-10-CM

## 2010-06-04 LAB — BASIC METABOLIC PANEL
Calcium: 9.1 mg/dL (ref 8.4–10.5)
Creatinine, Ser: 0.7 mg/dL (ref 0.4–1.2)
GFR: 81.15 mL/min (ref >60.00)

## 2010-06-04 LAB — TSH: TSH: 0.35 u[IU]/mL (ref 0.35–5.50)

## 2010-06-04 NOTE — Progress Notes (Signed)
  Subjective:    Patient ID: [name] [name], female    DOB: Jun 21, 1932, 77 y.o.   MRN: [id number]  HPI  Patient is seen in followup multiple medical problems  Hypertension which has been challenging to control. Currently improved home readings 468E systolic and 32Z diastolic. History of hyponatremia on hydrocholorothiazide. Currently low-dose furosemide.  Needs followup sodium today.  She has some chronic fatigue which is worse first thing in the morning and improves somewhat through the day. History hypothyroidism. No regular exercise. Very sedentary.    type 2 diabetes. No hypoglycemia. Medications reviewed.  Glucose has been well controlled.   Review of Systems  Constitutional: Positive for fatigue. Negative for fever, chills, diaphoresis, activity change and appetite change.  HENT: Negative for sore throat and sinus pressure.   Eyes: Positive for discharge. Negative for redness and visual disturbance.  Respiratory: Negative for cough and wheezing.   Cardiovascular: Negative for chest pain, palpitations and leg swelling.  Gastrointestinal: Negative for abdominal pain and blood in stool.  Genitourinary: Negative for dysuria.  Neurological: Positive for dizziness. Negative for headaches.  Psychiatric/Behavioral: Negative for dysphoric mood.       Objective:   Physical Exam  patient is alert and in no distress. Blood pressure 148/78 on repeat  Oropharynx is moist and clear Pupils equal reactive to light. Small cataract left eye. Conjunctiva normal Eardrums normal Neck supple no mass Chest good auscultation Heart regular rhythm and rate Extremities no edema       Assessment & Plan:   #1 hypothyroidism #2 type 2 diabetes which has been well controlled #3 history of hyponatremia #4 hypertension improved by home readings. #5 fatigue

## 2010-06-06 NOTE — Progress Notes (Signed)
Quick Note:  Pt informed ______ 

## 2010-06-10 ENCOUNTER — Ambulatory Visit
Admission: RE | Admit: 2010-06-10 | Discharge: 2010-06-10 | Disposition: A | Discharge status: Home / Self Care | Payer: Medicare Other | Source: Ambulatory Visit | Attending: Oncology | Admitting: Oncology

## 2010-06-10 DIAGNOSIS — Z9889 Other specified postprocedural states: Secondary | ICD-10-CM

## 2010-06-11 ENCOUNTER — Other Ambulatory Visit: Payer: Self-pay | Admitting: *Deleted

## 2010-06-11 DIAGNOSIS — E039 Hypothyroidism, unspecified: Secondary | ICD-10-CM

## 2010-06-11 MED ORDER — LEVOTHYROXINE SODIUM 125 MCG PO TABS: 125.0000 ug | ORAL_TABLET | Freq: Every day | ORAL | Status: DC

## 2010-06-13 ENCOUNTER — Other Ambulatory Visit: Payer: Self-pay | Admitting: Family Medicine

## 2010-06-13 DIAGNOSIS — M1990 Unspecified osteoarthritis, unspecified site: Secondary | ICD-10-CM

## 2010-06-13 MED ORDER — ALENDRONATE SODIUM 35 MG PO TABS: 35.0000 mg | ORAL_TABLET | ORAL | Status: DC

## 2010-06-13 NOTE — Telephone Encounter (Signed)
Rx sent as requested, pt phone busy several attempts

## 2010-06-13 NOTE — Telephone Encounter (Signed)
Pt called and said that [facility] [address] faxed over a refill req for alendronate (FOSAMAX) 35 MG tablet last weekend, with no response. Pls call this in asap today. Pt out of med.

## 2010-06-18 LAB — LIPASE, BLOOD: Lipase: 30 U/L (ref 11–59)

## 2010-06-18 LAB — BASIC METABOLIC PANEL
CO2: 24 mEq/L (ref 19–32)
Calcium: 8.9 mg/dL (ref 8.4–10.5)
Chloride: 93 mEq/L — ABNORMAL LOW (ref 96–112)
GFR calc Af Amer: >60 mL/min (ref >60)
Glucose, Bld: 203 mg/dL — ABNORMAL HIGH (ref 70–99)
Potassium: 3.7 mEq/L (ref 3.5–5.1)
Sodium: 127 mEq/L — ABNORMAL LOW (ref 135–145)

## 2010-06-18 LAB — DIFFERENTIAL
Basophils Absolute: 0 10*3/uL (ref 0.0–0.1)
Basophils Relative: 1 % (ref 0–1)
Eosinophils Absolute: 0.1 K/uL (ref 0.0–0.7)
Eosinophils Relative: 2 % (ref 0–5)
Lymphocytes Relative: 25 % (ref 12–46)
Lymphs Abs: 1.6 10*3/uL (ref 0.7–4.0)
Monocytes Absolute: 0.7 10*3/uL (ref 0.1–1.0)
Monocytes Relative: 11 % (ref 3–12)
Neutro Abs: 3.8 10*3/uL (ref 1.7–7.7)
Neutrophils Relative %: 61 % (ref 43–77)

## 2010-06-18 LAB — CBC
HCT: 32.2 % — ABNORMAL LOW (ref 36.0–46.0)
Hemoglobin: 10.4 g/dL — ABNORMAL LOW (ref 12.0–15.0)
Hemoglobin: 11.3 g/dL — ABNORMAL LOW (ref 12.0–15.0)
MCH: 32.9 pg (ref 26.0–34.0)
MCH: 33.4 pg (ref 26.0–34.0)
MCHC: 35.2 g/dL (ref 30.0–36.0)
MCV: 94.8 fL (ref 78.0–100.0)
Platelets: 265 10*3/uL (ref 150–400)
Platelets: 300 10*3/uL (ref 150–400)
RBC: 3.15 MIL/uL — ABNORMAL LOW (ref 3.87–5.11)
RBC: 3.39 MIL/uL — ABNORMAL LOW (ref 3.87–5.11)
RDW: 11.9 % (ref 11.5–15.5)
WBC: 4.8 10*3/uL (ref 4.0–10.5)
WBC: 6.3 K/uL (ref 4.0–10.5)

## 2010-06-18 LAB — COMPREHENSIVE METABOLIC PANEL
ALT: 21 U/L (ref 0–35)
AST: 26 U/L (ref 0–37)
AST: 35 U/L (ref 0–37)
Albumin: 3.3 g/dL — ABNORMAL LOW (ref 3.5–5.2)
Albumin: 4.1 g/dL (ref 3.5–5.2)
BUN: 9 mg/dL (ref 6–23)
Calcium: 9.6 mg/dL (ref 8.4–10.5)
Chloride: 82 mEq/L — ABNORMAL LOW (ref 96–112)
Chloride: 99 mEq/L (ref 96–112)
Creatinine, Ser: 0.64 mg/dL (ref 0.4–1.2)
Creatinine, Ser: 0.68 mg/dL (ref 0.4–1.2)
GFR calc Af Amer: >60 mL/min (ref >60)
GFR calc Af Amer: >60 mL/min (ref >60)
GFR calc non Af Amer: >60 mL/min (ref >60)
Sodium: 134 mEq/L — ABNORMAL LOW (ref 135–145)
Total Bilirubin: 0.6 mg/dL (ref 0.3–1.2)
Total Bilirubin: 0.9 mg/dL (ref 0.3–1.2)

## 2010-06-18 LAB — URINALYSIS, ROUTINE W REFLEX MICROSCOPIC
Bilirubin Urine: NEGATIVE
Glucose, UA: 100 mg/dL — AB
Hgb urine dipstick: NEGATIVE
Ketones, ur: NEGATIVE mg/dL
Nitrite: NEGATIVE
Protein, ur: NEGATIVE mg/dL
Specific Gravity, Urine: 1.007 (ref 1.005–1.030)
Urobilinogen, UA: 0.2 mg/dL (ref 0.0–1.0)
pH: 6.5 (ref 5.0–8.0)

## 2010-06-18 LAB — COMPREHENSIVE METABOLIC PANEL WITH GFR
ALT: 26 U/L (ref 0–35)
Alkaline Phosphatase: 32 U/L — ABNORMAL LOW (ref 39–117)
CO2: 28 meq/L (ref 19–32)
Glucose, Bld: 186 mg/dL — ABNORMAL HIGH (ref 70–99)
Potassium: 4 meq/L (ref 3.5–5.1)
Sodium: 121 meq/L — ABNORMAL LOW (ref 135–145)
Total Protein: 8 g/dL (ref 6.0–8.3)

## 2010-06-18 LAB — URINE MICROSCOPIC-ADD ON

## 2010-06-18 LAB — HEMOGLOBIN A1C: Mean Plasma Glucose: 151 mg/dL — ABNORMAL HIGH (ref <117)

## 2010-06-18 LAB — GLUCOSE, CAPILLARY: Glucose-Capillary: 144 mg/dL — ABNORMAL HIGH (ref 70–99)

## 2010-06-18 LAB — MAGNESIUM: Magnesium: 1.7 mg/dL (ref 1.5–2.5)

## 2010-06-18 LAB — PHOSPHORUS: Phosphorus: 4 mg/dL (ref 2.3–4.6)

## 2010-06-18 LAB — TSH: TSH: 1.185 u[IU]/mL (ref 0.350–4.500)

## 2010-07-10 LAB — URINALYSIS, ROUTINE W REFLEX MICROSCOPIC
Nitrite: NEGATIVE
Protein, ur: NEGATIVE mg/dL
Urobilinogen, UA: 0.2 mg/dL (ref 0.0–1.0)

## 2010-07-10 LAB — DIFFERENTIAL
Basophils Absolute: 0.1 10*3/uL (ref 0.0–0.1)
Lymphocytes Relative: 24 % (ref 12–46)
Monocytes Relative: 8 % (ref 3–12)

## 2010-07-10 LAB — CBC
HCT: 34.3 % — ABNORMAL LOW (ref 36.0–46.0)
Platelets: 287 10*3/uL (ref 150–400)
RBC: 3.59 MIL/uL — ABNORMAL LOW (ref 3.87–5.11)
WBC: 10.2 10*3/uL (ref 4.0–10.5)

## 2010-07-10 LAB — COMPREHENSIVE METABOLIC PANEL
ALT: 21 U/L (ref 0–35)
CO2: 25 mEq/L (ref 19–32)
Calcium: 9.6 mg/dL (ref 8.4–10.5)
Creatinine, Ser: 0.51 mg/dL (ref 0.4–1.2)
GFR calc non Af Amer: >60 mL/min (ref >60)
Glucose, Bld: 156 mg/dL — ABNORMAL HIGH (ref 70–99)

## 2010-07-10 LAB — URINE MICROSCOPIC-ADD ON

## 2010-07-10 LAB — LACTIC ACID, PLASMA: Lactic Acid, Venous: 1.8 mmol/L (ref 0.5–2.2)

## 2010-07-30 ENCOUNTER — Other Ambulatory Visit: Payer: Self-pay | Admitting: *Deleted

## 2010-07-30 DIAGNOSIS — E871 Hypo-osmolality and hyponatremia: Secondary | ICD-10-CM

## 2010-07-30 MED ORDER — METANX 3-35-2 MG PO TABS: ORAL_TABLET | ORAL | Status: DC

## 2010-08-20 ENCOUNTER — Ambulatory Visit: Payer: No Typology Code available for payment source | Admitting: Family Medicine

## 2010-08-20 NOTE — H&P (Signed)
NAMEVENNELA, JUTTE                 ACCOUNT NO.:  [id number]   MEDICAL RECORD NO.:  [id number]          PATIENT TYPE:  INP   LOCATION:  2108                         FACILITY:  [facility]   PHYSICIAN:  Larey Seat, M.D.  DATE OF BIRTH:  [birth year]   DATE OF ADMISSION:  10/19/2006  DATE OF DISCHARGE:                              HISTORY & PHYSICAL   Mrs. [name] [name] is a 77 year old Caucasian obese married right-handed  female who presented yesterday evening to the [facility] ER with a  transient left-sided heminumbness complaint.  Her chief complaint was  associated with numbness to fine touch and a tingling dysesthesia but no  weakness.   HISTORY OF PRESENT ILLNESS:  The patient noticed the left-sided numbness  in the face and body in the afternoon.  It increased during the evening  but she stated that at the time she reached the ER there was no  persistent deficit and she had not noticed any abnormalities of vision,  speech, language.  She was not dizzy, although the patient carries a  diagnosis of chronic vertigo, and she was not weak.  She was able to  walk well, dress herself , eat by herself.  The patient made a comment  that she actually felt a little bit as if her blood sugars were higher  than usual.  She was found in the ER to have slightly elevated blood  sugars and blood pressures.   PAST MEDICAL HISTORY:  1. Positive for vertigo.  Also, the patient was evaluated at our      office in the outpatient setting and notes are still pending.  I      remember that her MRI did not show a stroke in the cerebellar      peduncle or cerebellar structures and that she had small-vessel      disease.  2. She is a breast cancer survivor and currently orally treated with      Arimidex.  3. She has a history of hypertension and of diabetes mellitus treated      with oral antidiabetic.   Stroke risk factors therefore include:  1. Hyperlipidemia.  2. Diabetes mellitus.  3.  Hypertension.  4. Obesity.  5. Sedentary lifestyle.  6. Possible sleep apnea.   MEDICATIONS:  1. Glipizide XR 10 mg a day.  2. Metformin twice a day 500 mg.  3. Atenolol 50 mg b.i.d.  4. Exforge, a mixture of amlodipine and valsartan, is given at 5 mg      daily.  5. The patient takes Synthroid 0.125 mg a day.  6. Diazepam 2 mg p.r.n. vertigo.  7. She takes Arimidex 1 mg daily.  8. Fosamax 35 mg a day.  9. She takes multiple vitamin and iron supplements.   FAMILY HISTORY:  Positive for hypertension, hyperlipidemia.   SOCIAL HISTORY:  The patient is married, has adult healthy children.  Nonsmoker, nondrinker, retired.   REVIEW OF SYSTEMS:  The patient only endorsed numbness.  No pulmonary,  musculoskeletal, hematologic, lymphatic, GI, endocrine, GU, reproductive  or allergic/immunologic complaints.   PHYSICAL EXAMINATION:  Shows a well-developed, pleasant, alert and  oriented lady in no acute distress with a temperature of 98 degrees  Fahrenheit, a blood pressure of 171/70, heart rate of 71 that is  regular, respiratory rate is 17.  O2 saturation is 97.  The patient has  a Mallampati grade B, no goiter, no neck vein distension, no carotid  bruit.  I cannot detect a cardiac murmur.  NECK:  Supple.  LUNGS:  Clear to auscultation.  ABDOMEN:  Soft, obese and nontender.  There is no peripheral edema or  rash.  No bruising, clubbing, cyanosis.  No petechiae.  NEUROLOGIC:  Mental status:  Alert and oriented x3.  No dysphagia,  dysarthria or apraxia.  Cranial nerve examination shows bilateral  cataracts, a disrounded pupil on the left although the patient never had  surgery on the eyes.  No facial asymmetry.  She does notice a perioral  numbness and tingling.  This is not related to the left or right side  only.  She still states that her left hand is numb.  She has carries a  diagnosis of left hand carpal tunnel syndrome.  Motor examination shows  equal strength, tone and mass,  preserved reflexes in the upper  extremities, decreased reflexes in the lower extremities, downgoing toes  to plantar stimulation.  Finger-nose test shows no dysmetria, ataxia or  tremor.  Gait was deferred.  Sensory:  The patient now states that  except for the left hand and the carpal tunnel related numbness, she has  no longer body symptoms.   TEST RESULTS:  Show a hemoglobin/hematocrit 11.9/33.9; a white blood  cell count of 5200; platelet count was 270,000.  Albumin 3.7, calcium  9.2, sodium is slightly decreased at 133, potassium was 3.8, glucose was  elevated in the fasting state at 181.  The patient has a normal CT of  the head.  She had recently an outpatient MRI that we expect the report  to be faxed any minute.   ASSESSMENT:  Possible transient ischemic attack with heminumbness, small-  vessel disease likely given the patient's risk factors and the quick  improvement.  The patient will start on aspirin.   PLAN:  Stroke education treatment and current plan are discussed with  the patient, significant other and with Dr. Leonie Man on the stroke service.  The patient was not a tPA candidate given the small-vessel history, the  mildness of the symptoms, and the resolution.      Larey Seat, M.D.  Electronically Signed     CD/MEDQ  D:  10/19/2006  T:  10/19/2006  Job:  [id number]   cc:   Carolann Littler, M.D.

## 2010-08-20 NOTE — Discharge Summary (Signed)
[name] [name], [name] [name]                 ACCOUNT NO.:  [id number]   MEDICAL RECORD NO.:  [id number]          PATIENT TYPE:  INP   LOCATION:  3025                         FACILITY:  [facility]   PHYSICIAN:  Pramod P. Leonie Man, MD    DATE OF BIRTH:  1932/11/09   DATE OF ADMISSION:  10/19/2006  DATE OF DISCHARGE:  10/20/2006                               DISCHARGE SUMMARY   DIAGNOSES AT TIME OF DISCHARGE:  1. Right thalamic infarct with left-sided numbness secondary to small-      vessel disease.  2. Hypertension.  3. Chronic vertigo followed by Dr. Jannifer Franklin.  4. Diabetes poorly controlled.  5. Hyperlipidemia.  6. Breast cancer.   MEDICINES AT TIME OF DISCHARGE:  1. Glipizide 10 mg a day.  2. Metformin 500 mg b.i.d.  3. Atenolol 50 mg b.i.d.  4. Exforge 5 mg a day.  5. Synthroid 0.125 mg a day.  6. Valium 2 mg three times a day.  7. Arimidex 1 mg a day.  8. Fosamax 35 mg a week.  9. Citracal plus D daily.  10.Vitamin B6 daily.  11.Centrum Silver daily.  12.Iron daily.  13.Vitamin B12 daily.  14.Aspirin 325 mg daily.   STUDIES PERFORMED:  1. CT of the brain on admission shows no CT evidence for acute      intracranial abnormality and no intracranial mass lesions.  2. MRI of the brain shows a right thalamic infarct.  3. MRA of the brain.  Final results are pending.  4. Carotid Doppler shows no ICA stenosis.  5. 2-D echocardiogram performed.  Results pending.  6. EKG shows normal sinus rhythm.   LABORATORY STUDIES:  CBC with hemoglobin of 11.9, hematocrit 33.9,  otherwise normal.  Chemistry with sodium 133, glucose 181, otherwise  normal.  Coagulation studies on admission normal.  Liver function tests  normal.  Cardiac enzymes negative.  Cholesterol 202, triglycerides 210,  HDL 39, LDL 121.  Urinalysis with 3-6 white blood cells, 0-2 red blood  cells, rare bacteria, small leukocyte esterase.  Homocysteine pending.  Alcohol level less than 5.  Hemoglobin A1c 7.1   HISTORY OF PRESENT  ILLNESS:  [name] [name] is a 77 year old right-  handed Caucasian married female who had presented to [facility]  emergency room with a transient left-sided hemi numbness complaint.  Her  chief complaint was associated with numbness to fine touch and a  tingling dysesthesia but no weakness.  She noted left-sided numbness in  the face and body starting in the afternoon, increased by the evening.  By the time she reached the emergency room, there was mild persistent  deficit.  She did not complain of dizziness, though she carries a  diagnosis of chronic vertigo.  She had no extremity weakness.  Her gait  was steady.  Her blood pressure and CBCs were elevated in the emergency  room.  She was admitted for further evaluation.   HOSPITAL COURSE:  MRI did reveal an acute right thalamic infarct related  to her left hemisensory deficit.  Her infarct was felt to be secondary  to small-vessel disease,  as her blood pressure was slightly elevated in  the 150s to 80s and her diabetes was poorly controlled, given her  hemoglobin A1c of 7.1.  She did have elevated cholesterol with LDL of  121, and statin was recommended.  The patient states she has tried  multiple cholesterol-lowering medicines in the past with no success.  She did then think she may have had one drug that helped her, though she  cannot remember when it was.  She liked to call to Dr. Elease Hashimoto prior  to resuming any statin or cholesterol lowering medications.  She was  currently receiving outpatient PT for chronic dizziness at the  outpatient rehab center for [facility].  We will continue this with a  focus now on stroke and add occupational therapy evaluation for her arm.  She needs ongoing risk factor control for optimal prevention.   CONDITION AT DISCHARGE:  The patient alert and oriented x3.  No aphasia  or dysarthria.  Eye movements are full.  Face is symmetric.  No upper  extremity drift, no weakness.  She has decreased  sensation on the left  arm her heart rate is regular.  Her breath sounds are clear.   DISCHARGE/PLAN:  1. Discharged home with family.  2. Continue outpatient physical therapy.  Add outpatient occupational      therapy.  3. Follow up with Dr. Jannifer Franklin in 2 months related to stroke.  Will keep      appointment as current.  4. Follow up with Dr. Elease Hashimoto within 1 month.  5. Aspirin added for secondary stroke prevention.      Burnetta Sabin, N.P.    ______________________________  Kathie Rhodes. Leonie Man, MD    SB/MEDQ  D:  10/20/2006  T:  10/21/2006  Job:  [id number]   cc:   Carolann Littler, M.D.

## 2010-08-20 NOTE — Op Note (Signed)
[name], [name]                 ACCOUNT NO.:  [id number]   MEDICAL RECORD NO.:  [id number]          PATIENT TYPE:  INP   LOCATION:  5021                         FACILITY:  [facility]   PHYSICIAN:  Ninetta Lights, M.D. DATE OF BIRTH:  [birth year]   DATE OF PROCEDURE:  10/20/2007  DATE OF DISCHARGE:                               OPERATIVE REPORT   PREOPERATIVE DIAGNOSES:  1. End-stage degenerative arthritis, right knee.  2. Valgus alignment.  3. No flexion contracture.  4. Bony erosions, lateral compartment.   POSTOPERATIVE DIAGNOSES:  1. End-stage degenerative arthritis, right knee.  2. Valgus alignment.  3. No flexion contracture.  4. Bony erosions, lateral compartment.   PROCEDURE:  1. Right total knee replacement Stryker triathlon prosthesis.  2. A cemented pegged posterior stabilized #3 femoral component.      Cemented #3 tibial component with a 11-mm polyethylene insert.      Resurfacing a 32 mm x 10 mm patellar component.   SURGEON:  Ninetta Lights, MD   ASSISTANT:  Alyson Locket. Ricard Dillon, [place], present throughout the entire case  necessary for timely completion of procedure.   ANESTHESIA:  General.   BLOOD LOSS:  Minimal.   TOURNIQUET TIME:  1 hour and 50 minutes.   SPECIMENS:  None.   COMPLICATIONS:  None.   DRESSINGS:  Soft compressive with knee immobilizer.  Drain Hemovac x1.   PROCEDURE:  The patient was brought to the operating room and placed on  the operating table in supine position.  After adequate anesthesia had  been obtained, right knee examined.  Increased valgus of almost 15  degrees correctable to about 10.  Full extension and fairly good  flexion.  Tourniquet applied.  Prepped and draped in usual sterile  fashion.  Exsanguinated with elevation of Esmarch.  Tourniquet inflated  to 350 mmHg.  Straight incision above the patella down to tibial  tubercle.  Medial arthrotomy modified minimally invasive splitting the  vastus, preserving the quad tendon.   Knee exposed.  Grade 4 changes  laterally lesser extent in the other compartments.  Loose body spurs,  remnants of menisci, and cruciate ligaments were excised.  Distal femur  was exposed.  Intramedullary guide placed.  Distal cut set at 6 degrees  of valgus to match mechanical axis resecting 10 mm.  Epicondylar axis  marked.  Sized, cut, and fitted with a #3 component which gave good  coverage anterior, posterior as well as medial to lateral.  Attention  turned to the tibia and extramedullary guide.  Three-degree posterior  slope cut.  Resection below the defect laterally.  Size #3 component.  Patella exposed, measured, size cut, and then drilled for a 32-mm x 10-  mm component.  Trials put in place throughout.  A #3 on the femur, #3 on  the tibia, and the 32 mm on the patella.  With the 11-mm insert, I had a  nicely balanced knee, normal mechanical axis, full extension, full  flexion, good alignment, good stability, and good patellofemoral  tracking.  Tibia was marked for rotation and then hand reamed.  All  trials removed.  Copious irrigation of the pulse irrigating device.  Cement prepared and placed on all components.  All were firmly seated.  Excessive cement removed and the polyethylene attached to tibia.  Knee  reduced.  Once the cement had hardened, was reexamined.  Full extension,  full flexion, good alignment, good stability, and good patellofemoral  tracking.  Wound irrigated.  Hemovac placed and brought out through a  separate stab wound.  Arthrotomy closed with #1 Vicryl.  Skin and  subcutaneous tissue with Vicryl and staples.  Knee injected with  Marcaine, Hemovac clamp.  Sterile compressive dressing applied.  Tourniquet was inflated and removed.  Knee immobilizer applied.  Anesthesia reversed.  Brought to recovery room.  Tolerated surgery well  with no complications.      Ninetta Lights, M.D.  Electronically Signed     DFM/MEDQ  D:  10/21/2007  T:  10/22/2007   Job:  [id number]

## 2010-08-23 NOTE — Discharge Summary (Signed)
[name] [name], [name] [name]                 ACCOUNT NO.:  [id number]   MEDICAL RECORD NO.:  [id number]          PATIENT TYPE:  INP   LOCATION:  8250                         FACILITY:  [facility]   PHYSICIAN:  Ninetta Lights, M.D. DATE OF BIRTH:  1933-01-15   DATE OF ADMISSION:  10/20/2007  DATE OF DISCHARGE:  10/25/2007                               DISCHARGE SUMMARY   FINAL DIAGNOSES:  1  Status post right total knee replacement for end-  stage degenerative joint disease.  1. Hypothyroid.  2. Hyperlipidemia.  3. Type 2 diabetes mellitus.  4. Hypertension.  5. Cerebellar ataxia.  6. Chronic vertigo.   HISTORY OF PRESENT ILLNESS:  A 77 year old white female with history of  end-stage DJD, right knee, and chronic pain presented to our office for  preoperative evaluation for a total knee replacement.  She had  progressively worsening pain with failed response to conservative  treatment.  Significant decrease in her daily activity levels.   SURGEON:  Ninetta Lights, MD   ASSISTANT:  Alyson Locket. Velora Heckler.   ANESTHESIA:  General.   SPECIMENS:  None.   ESTIMATED BLOOD LOSS:  Minimal.   TOURNIQUET TIME:  70 minutes.   DRAINS:  One Autovac drain used.   HOSPITAL COURSE:  On October 20, 2007, the patient was taken to the [facility] and a right total knee replacement procedure performed.  There  were no surgical or anesthesia complications, and the patient was  transferred to recovery room in stable condition.  On October 21, 2007, the  patient was doing well with good pain control.  Did have some nausea.  Temp 99.2, pulse 79, respirations 18, and blood pressure 135/69.  Hemoglobin 9.4 and hematocrit 27.2.  Sodium 133, potassium 4.0, chloride  97, CO2 26, BUN 6, creatinine 0.62, and glucose 164.  INR 1.2.  Dressing  clean, dry, and intact.  Calf nontender.  Neurovascularly intact.  Discontinued PCA.  Pharmacy protocol Coumadin started.  PT consult.  On  October 22, 2007, the patient was doing  well without complaints.  Temp 97,  pulse 95, respirations 20, and blood pressure 119/65.  Hemoglobin 9.3  and hematocrit 26.1.  Sodium 130, potassium 4.2, chloride 102, CO2 27,  BUN 4, creatinine 0.59, and glucose 164.  INR 1.6.  Wound looks good,  staples intact.  No drainage or signs of infection.  Hemovac drain  discontinued.  Calf nontender.  Neurovascularly intact.  Discontinued  the Foley and saline lock IV.  On October 23, 2007, pain controlled.  Feels  like she is progressing slowly with therapy due to nausea.  No abdominal  pain.  Ambulated 15 feet previous day.  Temp 98.9, pulse 84,  respirations 16, and blood pressure 127/65.  Labs pending.  Wound looks  good, staples intact.  No drainage or signs of infection.  Calf  nontender.  Neurovascularly intact.  Discontinued Vicodin and started  Darvocet p.r.n. for pain to see if this will help with her nausea.  The  patient denied having a bowel movement, and she was given a Dulcolax  suppository.  On October 24, 2007, states that nausea improved.  Positive  small bowel movements.  Walked greater than 100 feet.  Temp 100.6, pulse  76, respirations 20, and blood pressure 135/74.  Hemoglobin 8.9,  hematocrit 24.7, WBC 7.1, and platelets 256.  Sodium 131, potassium 3.6,  chloride 96, CO2 27, BUN 5, creatinine 0.67, and glucose 134.  INR 1.6.  Wound looks good, staples intact.  No drainage or signs of infection.  Gave 500 mL bolus of normal saline.  Ordered KUB to rule out ileus.  On  October 25, 2007, the patient still complaining of nausea with regular  diet.  She states she had a pretty good bowel movement.  Denied  abdominal pain.  Wants to go home.  Temp 99.6, pulse 80, respirations  20, and blood pressure 146/76.  Hemoglobin 8.9 and hematocrit 25.3.  Sodium 137, potassium 3.7, chloride 102, CO2 24, BUN 3, creatinine 0.55,  and glucose 109.  INR 1.7.  Wound looks good, staples intact.  No  drainage or signs of infection.  Calf nontender.   Neurovascularly  intact.  Ordered regular diet and if the patient tolerates will be ready  to go home.  Later on in the day, the patient did well with her meal  without any issues.  Stable for discharge.   CONDITION:  Good, stable.   DISPOSITION:  Discharge home.   MEDICATIONS:  1. Phenergan 12.5 mg 1 tablet p.o. q.6 h. p.r.n. for nausea.  2. Coumadin, Pharmacy protocol.  3. Lovenox 30 mg 1 subcu injection b.i.d. x3 days and discontinue when      Coumadin therapeutic with INR 2.3.  4. Ultram 50 mg 1 tablet p.o. q.6 h. p.r.n. for pain.  5. Resume previous home medications.   INSTRUCTIONS:  The patient will work with home health, PT, and OT to  improve ambulation and knee range of motion and strengthening.  Daily  dressing changes with 4 x 4 gauze and tape.  Coumadin x4 weeks  postoperative for DVT prophylaxis.  She will follow up in the office  when she is 2 weeks postoperative for recheck, at which point we will  remove staples.  Return sooner if needed.      Alyson Locket. Velora Heckler.      Ninetta Lights, M.D.  Electronically Signed    JMO/MEDQ  D:  11/20/2007  T:  11/21/2007  Job:  [id number]

## 2010-08-27 NOTE — Op Note (Signed)
NAMECANDIDA, VETTER                 ACCOUNT NO.:  [id number]   MEDICAL RECORD NO.:  [id number]          PATIENT TYPE:  AMB   LOCATION:  [facility]                          FACILITY:  [facility]   PHYSICIAN:  Fenton Malling. Lucia Gaskins, M.D.  DATE OF BIRTH:  05-05-1932   DATE OF PROCEDURE:  09/09/2005  DATE OF DISCHARGE:                                 OPERATIVE REPORT   PREOPERATIVE DIAGNOSIS:  Carcinoma, left breast at 1 to 2 o'clock position.   POSTOPERATIVE DIAGNOSIS:  Carcinoma, left breast at 1 to 2 o'clock position  with negative sentinel lymph node.   OPERATION PERFORMED:  Needle localization left breast partial mastectomy  (lumpectomy), injection of 2 mL of dilute methylene blue, left axillary  sentinel lymph node biopsy.   SURGEON:  Fenton Malling. Lucia Gaskins, M.D.   ASSISTANT:  None.   ANESTHESIA:  General laryngeal mask airway with approximately 30 mL of 0.25%  Marcaine.   COMPLICATIONS:  None.   INDICATIONS FOR PROCEDURE:  Ms. Petrella is a 77 year old white female who had  an abnormal mammogram on May 1 revealing a nodule at the 1 o'clock position  of her left breast.  She underwent a core biopsy at the [facility] on May  15 and this showed invasive ductal carcinoma with DCIS.  Discussion carried  out with the patient about further treatment including lumpectomy versus  mastectomy.  She is interested in lumpectomy.  I talked about a sentinel  lymph node biopsy which I thought would be appropriate as this is a  nonpalpable and had to undergo needle localization.   Indications and potential complications of the procedure were explained to  the patient.  Potential complications include but not limited to bleeding,  infection, possibility that I will not get the nodule out and recurrence of  the tumor.   DESCRIPTION OF PROCEDURE:  The patient had a wire placed in the left breast.  The wire was coming out at about the 2 o'clock position.  The lesion marked  by the skin was about the 1 o'clock  position, maybe 2 cm off the edge of the  areola.  She also had been injected with sulfur colloid and there was a hot  spot low in her left axilla, right behind the pectoralis major muscle.   I marked the skin.  I injected her skin with 2 mL of dilute methylene blue  in both subcutaneous and inframammary position.  I then prepped the left  breast with Betadine solution and sterilely draped and trimmed the wire back  that was coming out of her breast.  I first turned my attention to the  axilla.  I made an incision directly over what appeared to be the hottest  spot and cut down on a lymph node which had counts of 1200 to 1400 with  background of 10.  There actually may have been two lymph nodes in the  specimen but the lymph node was hot but not blue.  It was sent to pathology.   Neldon Mc, M.D. called me and said that there was no evidence of  malignant  cells.  I then packed the axillary incision.  I then turned my attention to  the left breast.  I made an incision about 4 or 5 cm long excising the wire,  excising a core of breast tissue which I tried to go down about 6 or 7 cm  since the depth of the wire was 5 cm.   I tried to get a block probably 3 to 4 cm wide and tried to get 1 cm in all  directions where I imagined the wire was going.  The specimen was sent for  specimen mammogram which confirmed the prior clip that had been placed was  in the specimen and Dr. Brigitte Pulse took a look at it and felt I had a grossly 1 cm  margin.   I then irrigated the wound, controlled bleeding with hemostasis and 3-0  Vicryl sutures.  I closed the subcutaneous tissue with 3-0 Vicryl sutures  and the skin with 5-0 Monocryl suture.  I did the same for the axilla  closing the subcutaneous tissue with 3-0 Vicryl suture and the skin with a 5-  0 Monocryl suture, painted each wound with tincture of benzoin and steri-  stripped them.   The patient tolerated the procedure well, was transported to recovery  in  good condition.  Sponge and needle counts were correct at the end of this  case.      Fenton Malling. Lucia Gaskins, M.D.  Electronically Signed     DHN/MEDQ  D:  09/09/2005  T:  09/10/2005  Job:  [id number]   cc:   Youlanda Roys. Deatra Ina, M.D.  Fax: [phone]   S. Olena Mater, M.D.  Fax: [phone]   Chaney Born, M.D.  Fax: [phone]

## 2010-08-27 NOTE — Op Note (Signed)
NAME:  [name] [name], [name] [name]                           ACCOUNT NO.:  [id number]   MEDICAL RECORD NO.:  [id number]                   PATIENT TYPE:  AMB   LOCATION:  [facility]                                 FACILITY:  [facility]   PHYSICIAN:  James L. Rolla Flatten., M.D.          DATE OF BIRTH:  06-22-1932   DATE OF PROCEDURE:  03/10/2002  DATE OF DISCHARGE:                                 OPERATIVE REPORT   PROCEDURE PERFORMED:  Colonoscopy.   ENDOSCOPIST:  Joyice Faster. Edwards, M.D.   MEDICATIONS:  Fentanyl 100 mcg, Versed 10 mg IV.   INSTRUMENT USED:  I started off with the pediatric adjustable colonoscope  and switched over to the adult scope but we were unable completely around.   INDICATIONS FOR PROCEDURE:  Colon cancer screening.   DESCRIPTION OF PROCEDURE:  The procedure had been explained to the patient  and consent obtained.  With the patient in the left lateral decubitus  position, the scope was inserted and advanced under direct visualization.  We were able to advance to the hepatic flexure and after multiple maneuvers  were unable to advance down to the cecum and changed over to the adult  scope.  This was inserted and using the same maneuvers and positions,  eventually in the right lateral decubitus position, we were able to advance  just to the top of the ileocecal valve.  The cecum was seen in the distance.  The scope was withdrawn and the cecum, ascending colon, transverse colon,  descending and sigmoid colon were seen well upon removal.  No polyps seen,  no significant diverticulosis.  The scope was withdrawn.  The patient  tolerated the procedure well.   ASSESSMENT:  Normal screening colonoscopy.   PLAN:  Yearly Hemoccults.  Consider repeating the procedure in 10 years.                                                James L. Rolla Flatten., M.D.    Jaynie Bream  D:  03/10/2002  T:  03/10/2002  Job:  [id number]   cc:   Youlanda Roys. Deatra Ina, M.D.  [address]  [place]  [place] [address]  Fax: [phone]

## 2010-08-28 ENCOUNTER — Encounter: Payer: Self-pay | Admitting: Family Medicine

## 2010-08-28 ENCOUNTER — Ambulatory Visit (INDEPENDENT_AMBULATORY_CARE_PROVIDER_SITE_OTHER): Payer: Medicare Other | Admitting: Family Medicine

## 2010-08-28 DIAGNOSIS — E871 Hypo-osmolality and hyponatremia: Secondary | ICD-10-CM

## 2010-08-28 DIAGNOSIS — E785 Hyperlipidemia, unspecified: Secondary | ICD-10-CM

## 2010-08-28 DIAGNOSIS — I10 Essential (primary) hypertension: Secondary | ICD-10-CM

## 2010-08-28 DIAGNOSIS — E119 Type 2 diabetes mellitus without complications: Secondary | ICD-10-CM

## 2010-08-28 LAB — LIPID PANEL
Cholesterol: 165 mg/dL (ref 0–200)
Total CHOL/HDL Ratio: 3

## 2010-08-28 LAB — BASIC METABOLIC PANEL
BUN: 13 mg/dL (ref 6–23)
Calcium: 9.2 mg/dL (ref 8.4–10.5)
Chloride: 96 mEq/L (ref 96–112)
Creatinine, Ser: 0.7 mg/dL (ref 0.4–1.2)

## 2010-08-28 LAB — HEPATIC FUNCTION PANEL
AST: 25 U/L (ref 0–37)
Alkaline Phosphatase: 32 U/L — ABNORMAL LOW (ref 39–117)
Bilirubin, Direct: 0.1 mg/dL (ref 0.0–0.3)
Total Protein: 7.4 g/dL (ref 6.0–8.3)

## 2010-08-28 NOTE — Progress Notes (Signed)
  Subjective:    Patient ID: [name] [name], female    DOB: 1933-01-03, 77 y.o.   MRN: [id number]  HPI Patient seen for medical followup. She has hypothyroidism, hyperlipidemia, hyponatremia, history depression, hypertension, chronic intermittent vertigo with history of acute vestibular neuritis, and type 2 diabetes.  Diabetes fairly well controlled by home readings. Fasting blood sugars 115 120 range. No symptoms of hyperglycemia. Eye exam 3 months ago.  Hypertension treated with several medications. These are reviewed. She requests samples of amlodipine-Benicar combination pill. Compliant with all medications. No recent orthostasis. Continues to have some balance problems. She is considering acupuncture. Has had extensive physical therapy in the past. No recent falls. He is angulated gradually more and feels her vertigo is slightly improved.  History of hypothyroidism. Medication reviewed. Recent TSH normal.   Review of Systems  Constitutional: Negative for fever, appetite change, fatigue and unexpected weight change.  Respiratory: Negative for cough and shortness of breath.   Cardiovascular: Negative for chest pain, palpitations and leg swelling.  Gastrointestinal: Negative for abdominal pain.  Neurological: Negative for headaches.  Hematological: Negative for adenopathy. Does not bruise/bleed easily.  Psychiatric/Behavioral: Negative for dysphoric mood and agitation.       Objective:   Physical Exam  Constitutional: She is oriented to person, place, and time. She appears well-developed and well-nourished. No distress.  Cardiovascular: Normal rate and regular rhythm.  Exam reveals no gallop.   Murmur heard.      Patient has 3/6 systolic murmur heard best left sternal border.  Pulmonary/Chest: Effort normal and breath sounds normal. She has no wheezes. She has no rales. She exhibits no tenderness.  Musculoskeletal: She exhibits no edema.       Feet reveal no lesions. Normal sensory  function to touch. Good dorsalis pedis pulses bilaterally.  Neurological: She is alert and oriented to person, place, and time.          Assessment & Plan:  #1 type 2 diabetes. History of good control. Reassess A1c today #2 hypertension improved continue current medications. Well controlled by home readings #3 hyperlipidemia. Reassess lipids and hepatic panel today. Samples of Crestor given

## 2010-08-29 NOTE — Progress Notes (Signed)
Quick Note:  Pt informed and she voiced her understanding ______

## 2010-09-17 ENCOUNTER — Other Ambulatory Visit: Payer: Self-pay | Admitting: Oncology

## 2010-09-17 ENCOUNTER — Encounter (HOSPITAL_BASED_OUTPATIENT_CLINIC_OR_DEPARTMENT_OTHER): Payer: Medicare Other | Admitting: Oncology

## 2010-09-17 DIAGNOSIS — C9000 Multiple myeloma not having achieved remission: Secondary | ICD-10-CM

## 2010-09-17 DIAGNOSIS — C50419 Malignant neoplasm of upper-outer quadrant of unspecified female breast: Secondary | ICD-10-CM

## 2010-09-17 DIAGNOSIS — D649 Anemia, unspecified: Secondary | ICD-10-CM

## 2010-09-17 LAB — COMPREHENSIVE METABOLIC PANEL
Albumin: 4.3 g/dL (ref 3.5–5.2)
Alkaline Phosphatase: 34 U/L — ABNORMAL LOW (ref 39–117)
BUN: 14 mg/dL (ref 6–23)
Chloride: 93 mEq/L — ABNORMAL LOW (ref 96–112)
Glucose, Bld: 223 mg/dL — ABNORMAL HIGH (ref 70–99)
Potassium: 4 mEq/L (ref 3.5–5.3)
Sodium: 129 mEq/L — ABNORMAL LOW (ref 135–145)
Total Bilirubin: 0.5 mg/dL (ref 0.3–1.2)

## 2010-09-17 LAB — CBC WITH DIFFERENTIAL/PLATELET
Basophils Absolute: 0 10*3/uL (ref 0.0–0.1)
Eosinophils Absolute: 0.1 10*3/uL (ref 0.0–0.5)
HGB: 10.6 g/dL — ABNORMAL LOW (ref 11.6–15.9)
LYMPH%: 25.8 % (ref 14.0–49.7)
MCV: 94.4 fL (ref 79.5–101.0)
MONO%: 10.7 % (ref 0.0–14.0)
NEUT#: 3.5 10*3/uL (ref 1.5–6.5)
Platelets: 282 10*3/uL (ref 145–400)

## 2010-09-17 LAB — VITAMIN D 25 HYDROXY (VIT D DEFICIENCY, FRACTURES): Vit D, 25-Hydroxy: 34 ng/mL (ref 30–89)

## 2010-09-20 ENCOUNTER — Other Ambulatory Visit: Payer: Self-pay | Admitting: Family Medicine

## 2010-09-20 NOTE — Telephone Encounter (Signed)
This med is on pt chart in EMR to use as needed for vertigo, not filled in Epic yet

## 2010-09-21 NOTE — Telephone Encounter (Signed)
Ok to refill times 3

## 2010-09-24 ENCOUNTER — Encounter (HOSPITAL_BASED_OUTPATIENT_CLINIC_OR_DEPARTMENT_OTHER): Payer: Medicare Other | Admitting: Oncology

## 2010-09-24 DIAGNOSIS — Z170 Estrogen receptor positive status [ER+]: Secondary | ICD-10-CM

## 2010-09-24 DIAGNOSIS — R42 Dizziness and giddiness: Secondary | ICD-10-CM

## 2010-09-24 DIAGNOSIS — C50419 Malignant neoplasm of upper-outer quadrant of unspecified female breast: Secondary | ICD-10-CM

## 2010-09-24 DIAGNOSIS — E559 Vitamin D deficiency, unspecified: Secondary | ICD-10-CM

## 2010-10-03 ENCOUNTER — Ambulatory Visit (INDEPENDENT_AMBULATORY_CARE_PROVIDER_SITE_OTHER): Payer: Medicare Other | Admitting: Family Medicine

## 2010-10-03 ENCOUNTER — Encounter: Payer: Self-pay | Admitting: Family Medicine

## 2010-10-03 VITALS — BP 120/60 | Temp 98.7°F

## 2010-10-03 DIAGNOSIS — R42 Dizziness and giddiness: Secondary | ICD-10-CM

## 2010-10-03 MED ORDER — SCOPOLAMINE 1 MG/3DAYS TD PT72
1.0000 | MEDICATED_PATCH | TRANSDERMAL | Status: DC
Start: 2010-10-03 — End: 2011-10-03

## 2010-10-03 NOTE — Patient Instructions (Signed)
Try over the counter Allegra or Zyrtec for eye symptoms.

## 2010-10-03 NOTE — Progress Notes (Signed)
  Subjective:    Patient ID: [name] [name], female    DOB: August 05, 1932, 77 y.o.   MRN: [id number]  HPI Patient has long history of recurrent vertigo with prior history of probable acute vestibular neuritis. She had a few days of vertigo type dizziness with some nausea but no vomiting. Does have some nasal congestion and recent cold-like symptoms. No fever. She has type 2 diabetes has been well-controlled with recent blood sugars around 90-120. No symptoms of hypoglycemia. Medications reviewed. Blood pressure control has improved.  No headaches or focal weakness.   Review of Systems  Constitutional: Positive for fatigue. Negative for fever, chills and activity change.  HENT: Positive for congestion.   Respiratory: Negative for cough and shortness of breath.   Cardiovascular: Negative for chest pain, palpitations and leg swelling.  Neurological: Positive for dizziness. Negative for seizures, syncope and headaches.  Hematological: Negative for adenopathy. Does not bruise/bleed easily.  Psychiatric/Behavioral: Negative for confusion.       Objective:   Physical Exam  Constitutional: She is oriented to person, place, and time. She appears well-developed and well-nourished.  Cardiovascular: Normal rate, regular rhythm and normal heart sounds.   Pulmonary/Chest: Effort normal and breath sounds normal. No respiratory distress. She has no wheezes. She has no rales.  Musculoskeletal: She exhibits no edema.  Neurological: She is alert and oriented to person, place, and time. No cranial nerve deficit.       Patient has normal cerebellar function. No focal weakness.          Assessment & Plan:  Recurrent vertigo. History of acute vestibular neuritis. Short-term trial scopolamine patch one patch behind ear every 3 days. Reviewed possible side effects.

## 2010-10-07 ENCOUNTER — Ambulatory Visit: Payer: Medicare Other | Admitting: Family Medicine

## 2010-10-15 ENCOUNTER — Telehealth: Payer: Self-pay | Admitting: *Deleted

## 2010-10-15 NOTE — Telephone Encounter (Signed)
VM from pt requesting a return call.  Pt states she saw Dr. Percell Miller today and they are concerned about her walking, afraid she may fall.  Per pt, he recommended a Psych referral and told her to call her PCP? I recommended pt come to discuss with Dr Elease Hashimoto at an [facility]. FYI

## 2010-10-21 ENCOUNTER — Other Ambulatory Visit: Payer: Self-pay | Admitting: Family Medicine

## 2010-10-21 ENCOUNTER — Encounter: Payer: Self-pay | Admitting: Family Medicine

## 2010-10-21 ENCOUNTER — Ambulatory Visit (INDEPENDENT_AMBULATORY_CARE_PROVIDER_SITE_OTHER): Payer: Medicare Other | Admitting: Family Medicine

## 2010-10-21 VITALS — BP 150/72 | Temp 98.4°F | Wt 221.0 lb

## 2010-10-21 DIAGNOSIS — R42 Dizziness and giddiness: Secondary | ICD-10-CM

## 2010-10-21 DIAGNOSIS — R5383 Other fatigue: Secondary | ICD-10-CM

## 2010-10-21 DIAGNOSIS — R531 Weakness: Secondary | ICD-10-CM

## 2010-10-21 NOTE — Progress Notes (Addendum)
  Subjective:    Patient ID: [name] [name], female    DOB: [birth year], 77 y.o.   MRN: [id number]  HPI Patient seen referral from orthopedist. She's had history of acute vestibular neuritis and chronic vertigo. Extensive physical therapy and has also seen multiple neurologists previously. She has increased fear of falling. Her vertigo is actually somewhat better though she still has intermittent flares. She uses a cane for a ambulation. She had history of fall complicated by ankle fracture and she notices that she has increased fear of hard floors especially.   Review of Systems  Constitutional: Negative for fever and chills.  Respiratory: Negative for shortness of breath.   Cardiovascular: Negative for chest pain.  Genitourinary: Negative for dysuria.  Neurological: Positive for dizziness. Negative for syncope, weakness and headaches.       Objective:   Physical Exam  Constitutional: She is oriented to person, place, and time. She appears well-developed and well-nourished.  Cardiovascular: Normal rate and regular rhythm.  Exam reveals no gallop.   Pulmonary/Chest: Effort normal and breath sounds normal. No respiratory distress. She has no wheezes. She has no rales.  Neurological: She is alert and oriented to person, place, and time. No cranial nerve deficit.  Psychiatric: She has a normal mood and affect. Her behavior is normal. Thought content normal.          Assessment & Plan:  #1 history of acute vestibular neuritis with chronic intermittent vertigo #2 increased fear of falling. We have discussed this previously. Set up psychological counseling. May benefit from SSRI-type medication for anxiety symptoms  Very debilitated from lack of activity. We have discussed recommendation for another trial of physical therapy and she agrees

## 2010-10-21 NOTE — Patient Instructions (Addendum)
Schedule follow up with Richardo Priest.

## 2010-10-22 ENCOUNTER — Ambulatory Visit (INDEPENDENT_AMBULATORY_CARE_PROVIDER_SITE_OTHER): Payer: Medicare Other | Admitting: Licensed Clinical Social Worker

## 2010-10-22 DIAGNOSIS — F4323 Adjustment disorder with mixed anxiety and depressed mood: Secondary | ICD-10-CM

## 2010-10-22 NOTE — Progress Notes (Signed)
Addended by: [name] on: 10/22/2010 02:05 PM   Modules accepted: Orders

## 2010-10-24 ENCOUNTER — Ambulatory Visit: Payer: Medicare Other | Attending: Family Medicine

## 2010-10-24 DIAGNOSIS — IMO0001 Reserved for inherently not codable concepts without codable children: Secondary | ICD-10-CM | POA: Insufficient documentation

## 2010-10-24 DIAGNOSIS — R262 Difficulty in walking, not elsewhere classified: Secondary | ICD-10-CM | POA: Insufficient documentation

## 2010-10-24 DIAGNOSIS — R5381 Other malaise: Secondary | ICD-10-CM | POA: Insufficient documentation

## 2010-10-24 DIAGNOSIS — Z96659 Presence of unspecified artificial knee joint: Secondary | ICD-10-CM | POA: Insufficient documentation

## 2010-10-28 ENCOUNTER — Other Ambulatory Visit: Payer: Self-pay | Admitting: Family Medicine

## 2010-10-30 ENCOUNTER — Ambulatory Visit: Payer: Medicare Other | Admitting: Physical Therapy

## 2010-11-01 ENCOUNTER — Ambulatory Visit: Payer: Medicare Other

## 2010-11-05 ENCOUNTER — Ambulatory Visit: Payer: Medicare Other

## 2010-11-06 ENCOUNTER — Ambulatory Visit: Payer: Medicare Other | Admitting: Licensed Clinical Social Worker

## 2010-11-08 ENCOUNTER — Encounter: Payer: Self-pay | Admitting: Family Medicine

## 2010-11-08 ENCOUNTER — Ambulatory Visit (INDEPENDENT_AMBULATORY_CARE_PROVIDER_SITE_OTHER): Payer: Medicare Other | Admitting: Family Medicine

## 2010-11-08 VITALS — BP 162/92 | Temp 98.6°F | Wt 217.0 lb

## 2010-11-08 DIAGNOSIS — I10 Essential (primary) hypertension: Secondary | ICD-10-CM

## 2010-11-08 MED ORDER — AMLODIPINE-OLMESARTAN 5-40 MG PO TABS: 1.0000 | ORAL_TABLET | Freq: Every day | ORAL | Status: DC

## 2010-11-08 NOTE — Progress Notes (Signed)
  Subjective:    Patient ID: [name] [name], female    DOB: 01-Feb-1933, 77 y.o.   MRN: [id number]  HPI Hypertension evaluation. Patient currently takes amlodipine olmesartan 5/20 tablet 1 daily, Bystolic 10 mg daily , and Lasix 20 mg daily. Compliant with medications. Recent stress of husband admitted with CAD and bypass scheduled for Monday morning. She feels increased stress. Occasional headaches. Recent systolic blood pressure of 190 and ranging around 160 since then. Patient denies chest pain   Review of Systems  Respiratory: Negative for cough and shortness of breath.   Cardiovascular: Negative for chest pain, palpitations and leg swelling.  Neurological: Negative for dizziness, syncope and headaches.       Objective:   Physical Exam  Constitutional: She is oriented to person, place, and time. She appears well-developed and well-nourished.  Neck: Neck supple. No thyromegaly present.  Cardiovascular: Normal rate, regular rhythm and normal heart sounds.   Pulmonary/Chest: Effort normal and breath sounds normal. No respiratory distress. She has no wheezes. She has no rales.  Musculoskeletal: She exhibits no edema.  Lymphadenopathy:    She has no cervical adenopathy.  Neurological: She is alert and oriented to person, place, and time.          Assessment & Plan:  Hypertension with exacerbation possibly related to recent increased stress. Increase Azor to 5/40 mg combination and continue other medications. Reassess one week

## 2010-11-12 ENCOUNTER — Ambulatory Visit: Payer: Medicare Other | Attending: Family Medicine

## 2010-11-12 DIAGNOSIS — R262 Difficulty in walking, not elsewhere classified: Secondary | ICD-10-CM | POA: Insufficient documentation

## 2010-11-12 DIAGNOSIS — R5381 Other malaise: Secondary | ICD-10-CM | POA: Insufficient documentation

## 2010-11-12 DIAGNOSIS — Z96659 Presence of unspecified artificial knee joint: Secondary | ICD-10-CM | POA: Insufficient documentation

## 2010-11-12 DIAGNOSIS — IMO0001 Reserved for inherently not codable concepts without codable children: Secondary | ICD-10-CM | POA: Insufficient documentation

## 2010-11-14 ENCOUNTER — Ambulatory Visit: Payer: Medicare Other

## 2010-11-15 ENCOUNTER — Ambulatory Visit: Payer: Medicare Other | Admitting: Family Medicine

## 2010-11-18 ENCOUNTER — Ambulatory Visit (INDEPENDENT_AMBULATORY_CARE_PROVIDER_SITE_OTHER): Payer: Medicare Other | Admitting: Family Medicine

## 2010-11-18 ENCOUNTER — Ambulatory Visit (INDEPENDENT_AMBULATORY_CARE_PROVIDER_SITE_OTHER): Payer: Medicare Other | Admitting: Licensed Clinical Social Worker

## 2010-11-18 ENCOUNTER — Encounter: Payer: Self-pay | Admitting: Family Medicine

## 2010-11-18 ENCOUNTER — Other Ambulatory Visit: Payer: Self-pay | Admitting: Family Medicine

## 2010-11-18 DIAGNOSIS — F4323 Adjustment disorder with mixed anxiety and depressed mood: Secondary | ICD-10-CM

## 2010-11-18 DIAGNOSIS — I10 Essential (primary) hypertension: Secondary | ICD-10-CM

## 2010-11-18 NOTE — Progress Notes (Signed)
  Subjective:    Patient ID: [name] [name], female    DOB: 1932-12-25, 77 y.o.   MRN: [id number]  HPI Followup hypertension. We increased her Azor to 5/40 mg last visit and blood pressure is much improved. Headaches have resolved. Blood pressures around 130/60 at home. No dizziness. No orthostasis. Denies peripheral edema problems. Increased stress with husband having recent bypass and daughter-in-law lost leg due to accident yesterday.  Review of Systems  Constitutional: Negative for fatigue.  Eyes: Negative for visual disturbance.  Respiratory: Negative for cough, chest tightness, shortness of breath and wheezing.   Cardiovascular: Negative for chest pain, palpitations and leg swelling.  Neurological: Negative for dizziness, seizures, syncope, weakness, light-headedness and headaches.       Objective:   Physical Exam  Constitutional: She is oriented to person, place, and time. She appears well-developed and well-nourished.  Cardiovascular: Normal rate, regular rhythm and normal heart sounds.   Pulmonary/Chest: Effort normal and breath sounds normal. No respiratory distress. She has no wheezes. She has no rales.  Musculoskeletal: She exhibits no edema.  Neurological: She is alert and oriented to person, place, and time.          Assessment & Plan:  Hypertension improved. Continue current medications. Continue home monitoring. Routine followup 3 months

## 2010-11-19 ENCOUNTER — Ambulatory Visit: Payer: Medicare Other

## 2010-11-21 ENCOUNTER — Ambulatory Visit: Payer: Medicare Other

## 2010-11-22 ENCOUNTER — Telehealth: Payer: Self-pay | Admitting: *Deleted

## 2010-11-22 NOTE — Telephone Encounter (Signed)
Pt called requesting an antibiotic for sinus symptoms.  Offered her an appt to see someone today, but she would rather Wait and ask Dr. Elease Hashimoto on Monday.

## 2010-11-24 NOTE — Telephone Encounter (Signed)
See how pt is doing. If no better, will be happy to see Monday.

## 2010-11-25 NOTE — Telephone Encounter (Signed)
Appt scheduled to see Dr. Elease Hashimoto.

## 2010-11-26 ENCOUNTER — Ambulatory Visit (INDEPENDENT_AMBULATORY_CARE_PROVIDER_SITE_OTHER): Payer: Medicare Other | Admitting: Family Medicine

## 2010-11-26 ENCOUNTER — Encounter: Payer: Self-pay | Admitting: Family Medicine

## 2010-11-26 DIAGNOSIS — J019 Acute sinusitis, unspecified: Secondary | ICD-10-CM

## 2010-11-26 MED ORDER — AMOXICILLIN 875 MG PO TABS: 875.0000 mg | ORAL_TABLET | Freq: Two times a day (BID) | ORAL | Status: DC

## 2010-11-26 NOTE — Progress Notes (Signed)
  Subjective:    Patient ID: [name] [name], female    DOB: 1932/08/31, 77 y.o.   MRN: [id number]  HPI Progressive sinus congestion over the past week. Frontal sinus pressure. Intermittent headaches. Thick postnasal mucus drainage. About 2 days ago developed some bloody postnasal drainage. Occasional dry cough. No sore throat. Denies any fever or chills. History of frequent sinusitis in the past. Allergy to sulfa and codeine   Review of Systems  Constitutional: Positive for fatigue. Negative for fever and chills.  HENT: Positive for congestion, postnasal drip and sinus pressure. Negative for ear pain, sore throat and voice change.   Respiratory: Positive for cough. Negative for shortness of breath and wheezing.        Objective:   Physical Exam  Constitutional: She appears well-developed and well-nourished.  HENT:  Right Ear: External ear normal.  Left Ear: External ear normal.  Nose: Nose normal.  Mouth/Throat: Oropharynx is clear and moist. No oropharyngeal exudate.  Neck: Neck supple. No thyromegaly present.  Cardiovascular: Normal rate and regular rhythm.   Pulmonary/Chest: Effort normal and breath sounds normal. No respiratory distress. She has no wheezes. She has no rales.  Lymphadenopathy:    She has no cervical adenopathy.          Assessment & Plan:  Acute sinusitis. Amoxicillin 875 mg twice a day for 10 days.

## 2010-11-26 NOTE — Patient Instructions (Signed)
Sinusitis Sinuses are air pockets within the bones of your face. The growth of bacteria within a sinus leads to infection. Infection keeps the sinuses from draining. This infection is called sinusitis. SYMPTOMS There will be different areas of pain depending on which sinuses have become infected.  The maxillary sinuses often produce pain beneath the eyes.   Frontal sinusitis may cause pain in the middle of the forehead and above the eyes.  Other problems (symptoms) include:  Toothaches.   Colored, pus-like (purulent) drainage from the nose.   Any swelling, warmth, or tenderness over the sinus areas may be signs of infection.  TREATMENT Sinusitis is most often determined by an exam and you may have x-rays taken. If x-rays have been taken, make sure you obtain your results. Or find out how you are to obtain them. Your caregiver may give you medications (antibiotics). These are medications that will help kill the infection. You may also be given a medication (decongestant) that helps to reduce sinus swelling.  HOME CARE INSTRUCTIONS  Only take over-the-counter or prescription medicines for pain, discomfort, or fever as directed by your caregiver.   Drink extra fluids. Fluids help thin the mucus so your sinuses can drain more easily.   Applying either moist heat or ice packs to the sinus areas may help relieve discomfort.   Use saline nasal sprays to help moisten your sinuses. The sprays can be found at your local drugstore.  SEEK IMMEDIATE MEDICAL CARE IF YOU DEVELOP:  High fever that is still present after two days of antibiotic treatment.   Increasing pain, severe headaches, or toothache.   Nausea, vomiting, or drowsiness.   Unusual swelling around the face or trouble seeing.  MAKE SURE YOU:   Understand these instructions.   Will watch your condition.   Will get help right away if you are not doing well or get worse.  Document Released: 03/24/2005 Document Re-Released:  03/06/2008 [facility] Patient Information 2011 [place].

## 2010-11-27 ENCOUNTER — Ambulatory Visit: Payer: Medicare Other | Admitting: Family Medicine

## 2010-12-02 ENCOUNTER — Encounter: Payer: Self-pay | Admitting: Family Medicine

## 2010-12-02 ENCOUNTER — Ambulatory Visit: Payer: Medicare Other | Admitting: Licensed Clinical Social Worker

## 2010-12-05 ENCOUNTER — Ambulatory Visit: Payer: Medicare Other | Admitting: Physical Therapy

## 2010-12-06 ENCOUNTER — Ambulatory Visit (INDEPENDENT_AMBULATORY_CARE_PROVIDER_SITE_OTHER): Payer: Medicare Other | Admitting: Family Medicine

## 2010-12-06 ENCOUNTER — Encounter: Payer: Self-pay | Admitting: Family Medicine

## 2010-12-06 VITALS — BP 140/70 | Temp 98.4°F | Wt 208.0 lb

## 2010-12-06 DIAGNOSIS — J310 Chronic rhinitis: Secondary | ICD-10-CM

## 2010-12-06 MED ORDER — FLUTICASONE PROPIONATE 50 MCG/ACT NA SUSP: 2.0000 | Freq: Every day | NASAL | Status: DC

## 2010-12-06 MED ORDER — LEVOFLOXACIN 500 MG PO TABS: 500.0000 mg | ORAL_TABLET | Freq: Every day | ORAL | Status: AC

## 2010-12-06 NOTE — Progress Notes (Signed)
  Subjective:    Patient ID: [name] [name], female    DOB: April 04, 1933, 77 y.o.   MRN: [id number]  HPI Persistent symptoms of sore throat, cough, intermittent headache, and diffuse facial pain. Patient recently placed on amoxicillin but had nausea. Subsequently switched to Zithromax which she finished 2 days ago. She denies fever. Nasal drainage is mostly clear and mostly has postnasal drainage. She has some intermittent nausea which may be related to this. Cough is mostly dry but occasionally productive. No fever or chills.   Review of Systems  Constitutional: Negative for fever and chills.  HENT: Positive for congestion and sinus pressure. Negative for ear pain.   Respiratory: Negative for cough.   Cardiovascular: Negative for chest pain and palpitations.       Objective:   Physical Exam  Constitutional: She appears well-developed and well-nourished.  HENT:  Right Ear: External ear normal.  Left Ear: External ear normal.  Mouth/Throat: Oropharynx is clear and moist.       Mostly clear nasal mucus  Neck: Neck supple.  Cardiovascular: Normal rate and regular rhythm.   Pulmonary/Chest: Effort normal and breath sounds normal. No respiratory distress. She has no wheezes. She has no rales.  Lymphadenopathy:    She has no cervical adenopathy.          Assessment & Plan:  Rhinitis. Suspect that part of this may be allergic. Trial of Flonase nasal 2 sprays per nostril once daily. If she develops any fever or progressive facial pain or pus like discharge start Levaquin 500 milligrams daily for 10 days

## 2010-12-06 NOTE — Patient Instructions (Signed)
Start Levaquin if you have any fever, pus-like nasal discharge, or focal facial pain.

## 2010-12-10 ENCOUNTER — Encounter: Payer: Medicare Other | Admitting: Physical Therapy

## 2010-12-11 ENCOUNTER — Telehealth: Payer: Self-pay | Admitting: *Deleted

## 2010-12-11 NOTE — Telephone Encounter (Addendum)
Wants to speak to Dr. Elease Hashimoto ASAP as  She feels no better than she did when she first came over to the office and has been here 4- 5 times in the last month.

## 2010-12-12 ENCOUNTER — Encounter: Payer: Medicare Other | Admitting: Physical Therapy

## 2010-12-13 ENCOUNTER — Ambulatory Visit (INDEPENDENT_AMBULATORY_CARE_PROVIDER_SITE_OTHER): Payer: Medicare Other | Admitting: Family Medicine

## 2010-12-13 DIAGNOSIS — I10 Essential (primary) hypertension: Secondary | ICD-10-CM

## 2010-12-13 DIAGNOSIS — R110 Nausea: Secondary | ICD-10-CM

## 2010-12-13 DIAGNOSIS — E871 Hypo-osmolality and hyponatremia: Secondary | ICD-10-CM

## 2010-12-13 DIAGNOSIS — J310 Chronic rhinitis: Secondary | ICD-10-CM

## 2010-12-13 LAB — BASIC METABOLIC PANEL
GFR: 66.35 mL/min (ref >60.00)
Potassium: 4.2 mEq/L (ref 3.5–5.1)
Sodium: 128 mEq/L — ABNORMAL LOW (ref 135–145)

## 2010-12-13 MED ORDER — METHYLPREDNISOLONE ACETATE 80 MG/ML IJ SUSP
80.0000 mg | Freq: Once | INTRAMUSCULAR | Status: AC
  Administered 2010-12-13: 80 mg via INTRAMUSCULAR

## 2010-12-13 NOTE — Patient Instructions (Signed)
Try over-the-counter Allegra 180 mg one daily Continue Flonase 2 sprays per nostril once daily Hold furosemide for now until nausea and appetite improved

## 2010-12-13 NOTE — Progress Notes (Signed)
  Subjective:    Patient ID: [name] [name], female    DOB: 1933/02/17, 77 y.o.   MRN: [id number]  HPI Patient had several weeks of nasal congestion which persists. Concern initially for sinusitis. She did not improve following Zithromax. At this point her nasal discharge is clear. She is complaining of nausea and persistent sneezing and postnasal discharge. No improvement with Flonase. She denies fever or chills. Intermittent bifrontal headaches. She has some chronic intermittent vertigo but has not any recent vertigo symptoms. She has not tried any recent antihistamines.  She has history of hyponatremia. Challenging to control blood pressure but recently stable. Poor appetite and poor intake past several days. Remains on furosemide 20 mg daily as well as other medications which are reviewed. She denies any abdominal pain. No change in urine or stool habits. No dyspnea or chest pain.   Review of Systems  Constitutional: Positive for appetite change and fatigue. Negative for fever, chills and unexpected weight change.  HENT: Positive for congestion, rhinorrhea, sneezing, postnasal drip and sinus pressure. Negative for sore throat.   Respiratory: Negative for cough, shortness of breath and wheezing.   Cardiovascular: Negative for chest pain.  Neurological: Positive for headaches.  Psychiatric/Behavioral: Positive for dysphoric mood.       Objective:   Physical Exam  Constitutional: She appears well-developed and well-nourished.  HENT:  Right Ear: External ear normal.  Left Ear: External ear normal.  Mouth/Throat: Oropharynx is clear and moist.       Significant nasal mucosal swelling bilaterally. No visible polyps  Eyes: Pupils are equal, round, and reactive to light.  Neck: Neck supple.  Cardiovascular: Normal rate and regular rhythm.   Pulmonary/Chest: Effort normal and breath sounds normal. No respiratory distress. She has no wheezes. She has no rales.  Abdominal: Soft. She exhibits no  mass. There is no tenderness. There is no rebound and no guarding.  Lymphadenopathy:    She has no cervical adenopathy.  Skin: No rash noted.          Assessment & Plan:  #1 Rhinitis. Suspect allergic. Try Allegra one daily. Continue Flonase. Depo-Medrol 80 mg IM. She is aware we cannot do this frequently. Monitor blood sugars closely #2 nausea. Possibly related to postnasal drainage. She has history of hyponatremia. Recheck basic metabolic panel. Hold Lasix for now #3 hypertension improved

## 2010-12-13 NOTE — Telephone Encounter (Signed)
Pt here today to be seen.

## 2010-12-15 ENCOUNTER — Encounter: Payer: Self-pay | Admitting: Family Medicine

## 2010-12-16 ENCOUNTER — Ambulatory Visit (INDEPENDENT_AMBULATORY_CARE_PROVIDER_SITE_OTHER): Payer: Medicare Other | Admitting: Licensed Clinical Social Worker

## 2010-12-16 DIAGNOSIS — F4323 Adjustment disorder with mixed anxiety and depressed mood: Secondary | ICD-10-CM

## 2010-12-17 ENCOUNTER — Ambulatory Visit: Payer: Medicare Other | Attending: Family Medicine

## 2010-12-17 DIAGNOSIS — R5381 Other malaise: Secondary | ICD-10-CM | POA: Insufficient documentation

## 2010-12-17 DIAGNOSIS — IMO0001 Reserved for inherently not codable concepts without codable children: Secondary | ICD-10-CM | POA: Insufficient documentation

## 2010-12-17 DIAGNOSIS — Z96659 Presence of unspecified artificial knee joint: Secondary | ICD-10-CM | POA: Insufficient documentation

## 2010-12-17 DIAGNOSIS — R262 Difficulty in walking, not elsewhere classified: Secondary | ICD-10-CM | POA: Insufficient documentation

## 2010-12-17 NOTE — Progress Notes (Signed)
Quick Note:  Pt informed ______

## 2010-12-19 ENCOUNTER — Ambulatory Visit (INDEPENDENT_AMBULATORY_CARE_PROVIDER_SITE_OTHER): Payer: Medicare Other | Admitting: Family Medicine

## 2010-12-19 ENCOUNTER — Encounter: Payer: Self-pay | Admitting: Family Medicine

## 2010-12-19 VITALS — BP 140/78 | Temp 97.9°F | Wt 205.0 lb

## 2010-12-19 DIAGNOSIS — R51 Headache: Secondary | ICD-10-CM

## 2010-12-19 MED ORDER — LEVOFLOXACIN 500 MG PO TABS: 500.0000 mg | ORAL_TABLET | Freq: Every day | ORAL | Status: AC

## 2010-12-19 NOTE — Progress Notes (Signed)
  Subjective:    Patient ID: [name] [name], female    DOB: 1933/03/23, 77 y.o.   MRN: [id number]  HPI Persistent nasal congestion and sense of malaise. We suspected persistent allergies and since last visit she started Allegra and we also gave Depo-Medrol. She does have slightly decreased nasal congestion and still has persistent bifrontal and bimaxillary facial pain. She has some night sweats but no definite fever. She's had some low-grade nausea. Also using Flonase regularly. No purulent discharge. Occasional mild sore throat and rare dry cough. Was initially placed on amoxicillin but had nausea. Subsequently Zithromax which did not seem to help.   Review of Systems  Constitutional: Negative for fever and chills.  HENT: Positive for congestion and sinus pressure. Negative for ear pain.   Respiratory: Negative for cough and shortness of breath.   Gastrointestinal: Negative for abdominal pain.  Neurological: Positive for headaches.       Objective:   Physical Exam  Constitutional: She appears well-developed and well-nourished. No distress.  HENT:  Right Ear: External ear normal.  Left Ear: External ear normal.  Nose: Nose normal.  Mouth/Throat: Oropharynx is clear and moist.  Neck: Neck supple.  Cardiovascular: Normal rate and regular rhythm.   Pulmonary/Chest: Effort normal and breath sounds normal. No respiratory distress. She has no wheezes. She has no rales.  Lymphadenopathy:    She has no cervical adenopathy.          Assessment & Plan:  Patient presents with persistent nasal congestion, headaches, bilateral frontal sinus pressure. No improvement with recent antibiotic. Limited CT maxillofacial. Start Levaquin 500 milligrams once daily for 10 days.

## 2010-12-20 ENCOUNTER — Ambulatory Visit (INDEPENDENT_AMBULATORY_CARE_PROVIDER_SITE_OTHER)
Admission: RE | Admit: 2010-12-20 | Discharge: 2010-12-20 | Disposition: A | Discharge status: Home / Self Care | Payer: Medicare Other | Source: Ambulatory Visit | Attending: Family Medicine | Admitting: Family Medicine

## 2010-12-20 DIAGNOSIS — R51 Headache: Secondary | ICD-10-CM

## 2010-12-24 ENCOUNTER — Ambulatory Visit: Payer: Medicare Other

## 2010-12-24 NOTE — Progress Notes (Signed)
Quick Note:  Pt informed ______ 

## 2010-12-26 ENCOUNTER — Ambulatory Visit: Payer: Medicare Other

## 2010-12-30 ENCOUNTER — Encounter: Payer: Self-pay | Admitting: Family Medicine

## 2010-12-30 ENCOUNTER — Ambulatory Visit (INDEPENDENT_AMBULATORY_CARE_PROVIDER_SITE_OTHER): Payer: Medicare Other | Admitting: Licensed Clinical Social Worker

## 2010-12-30 ENCOUNTER — Ambulatory Visit (INDEPENDENT_AMBULATORY_CARE_PROVIDER_SITE_OTHER): Payer: Medicare Other | Admitting: Family Medicine

## 2010-12-30 DIAGNOSIS — F4323 Adjustment disorder with mixed anxiety and depressed mood: Secondary | ICD-10-CM

## 2010-12-30 DIAGNOSIS — R109 Unspecified abdominal pain: Secondary | ICD-10-CM

## 2010-12-30 DIAGNOSIS — R110 Nausea: Secondary | ICD-10-CM

## 2010-12-30 LAB — POCT URINALYSIS DIPSTICK
Bilirubin, UA: NEGATIVE
Ketones, UA: NEGATIVE
pH, UA: 7

## 2010-12-30 MED ORDER — PROMETHAZINE HCL 25 MG/ML IJ SOLN
25.0000 mg | Freq: Once | INTRAMUSCULAR | Status: AC
  Administered 2010-12-30: 25 mg via INTRAMUSCULAR

## 2010-12-30 NOTE — Patient Instructions (Signed)
Follow up promptly for any diarrhea, worsening abdominal pain, or any fever.

## 2010-12-30 NOTE — Progress Notes (Signed)
  Subjective:    Patient ID: [name] [name], female    DOB: Mar 11, 1933, 77 y.o.   MRN: [id number]  HPI Onset last night of abdominal pain. Location is bilateral diffuse. Quality is aching and severe to moderate. Symptoms somewhat intermittent. She had some nausea and vomiting last night and chills but no documented fever. Denies dysuria. She denies any constipation or diarrhea. Took some Reglan earlier today which may have helped nausea slightly. Has been on some recent antibiotics but again as above no diarrhea. She has not had history of diverticulitis but does have diverticula on previous CT scan abdomen and pelvis.  No aggravating factors. No alleviating factors.  Previous surgical hx as below.  Past Medical History  Diagnosis Date  . HYPOTHYROIDISM 07/24/2008  . HYPERLIPIDEMIA 07/24/2008  . DEPRESSION 05/16/2009  . HYPERTENSION 07/24/2008   Past Surgical History  Procedure Date  . Knee surgery 2009    TKR  . Cholecystectomy 1980  . Breast surgery 2007    biopsy, cancer    reports that she quit smoking about 28 years ago. Her smoking use included Cigarettes. She has a 6 pack-year smoking history. She does not have any smokeless tobacco history on file. Her alcohol and drug histories not on file. family history includes Arthritis in her mother; Cancer in her sister; and Heart disease in her father. Allergies  Allergen Reactions  . Amoxicillin     GI upset  . Codeine Sulfate     REACTION: GI upset  . Sulfonamide Derivatives     REACTION: GI upset      Review of Systems  Constitutional: Positive for chills. Negative for fever and unexpected weight change.  Gastrointestinal: Positive for nausea, vomiting and abdominal pain. Negative for diarrhea, constipation, blood in stool and abdominal distention.  Genitourinary: Negative for dysuria.  Musculoskeletal: Negative for back pain.  Neurological: Negative for syncope and headaches.       Objective:   Physical Exam    Constitutional: She is oriented to person, place, and time. She appears well-developed and well-nourished. No distress.  HENT:  Mouth/Throat: Oropharynx is clear and moist.  Neck: Neck supple.  Cardiovascular: Normal rate and regular rhythm.   Pulmonary/Chest: Effort normal and breath sounds normal. No respiratory distress. She has no wheezes. She has no rales.  Abdominal: Soft. Bowel sounds are normal. She exhibits no distension and no mass. There is no tenderness. There is no rebound and no guarding.       No reproducible tenderness at this time. No guarding  Neurological: She is alert and oriented to person, place, and time.          Assessment & Plan:  #1 abdominal pain. Bilateral diffuse. Doubt diverticulitis. No evidence for urinary infection. Multiple recent antibiotics but no diarrhea to suggest likely C. difficile. Check CBC and UA. Phenergan 25 mg IM for nausea. Followup promptly for fever, diarrhea, or worsening pain

## 2010-12-31 LAB — CBC WITH DIFFERENTIAL/PLATELET
Basophils Relative: 0.4 % (ref 0.0–3.0)
Lymphocytes Relative: 29.7 % (ref 12.0–46.0)
Lymphs Abs: 2.6 10*3/uL (ref 0.7–4.0)
MCHC: 33.6 g/dL (ref 30.0–36.0)
MCV: 92.6 fl (ref 78.0–100.0)
Neutro Abs: 5.2 10*3/uL (ref 1.4–7.7)
Neutrophils Relative %: 59.5 % (ref 43.0–77.0)
Platelets: 312 10*3/uL (ref 150.0–400.0)
RBC: 3.36 Mil/uL — ABNORMAL LOW (ref 3.87–5.11)
RDW: 14.2 % (ref 11.5–14.6)

## 2011-01-01 NOTE — Progress Notes (Signed)
Quick Note:  Pt informed, reports no urinary sx, reported feeling better today. FYI ______

## 2011-01-02 ENCOUNTER — Ambulatory Visit: Payer: Medicare Other

## 2011-01-02 LAB — CBC
HCT: 35.5 — ABNORMAL LOW
Hemoglobin: 12.3
RBC: 3.74 — ABNORMAL LOW
RDW: 12.1

## 2011-01-02 LAB — PROTIME-INR
INR: 1
Prothrombin Time: 13.5

## 2011-01-02 LAB — URINALYSIS, ROUTINE W REFLEX MICROSCOPIC
Bilirubin Urine: NEGATIVE
Glucose, UA: NEGATIVE
Hgb urine dipstick: NEGATIVE
Ketones, ur: NEGATIVE
Nitrite: NEGATIVE
Protein, ur: NEGATIVE
Specific Gravity, Urine: 1.008
Urobilinogen, UA: 0.2
pH: 7.5

## 2011-01-02 LAB — COMPREHENSIVE METABOLIC PANEL
Alkaline Phosphatase: 48
BUN: 7
CO2: 30
GFR calc non Af Amer: >60
Glucose, Bld: 150 — ABNORMAL HIGH
Potassium: 4
Total Bilirubin: 0.8
Total Protein: 7.6

## 2011-01-03 LAB — BASIC METABOLIC PANEL
BUN: 3 — ABNORMAL LOW
BUN: 4 — ABNORMAL LOW
CO2: 27
Calcium: 8.3 — ABNORMAL LOW
Calcium: 8.5
Calcium: 8.8
Chloride: 100
Chloride: 96
Chloride: 98
Creatinine, Ser: 0.59
Creatinine, Ser: 0.62
Creatinine, Ser: 0.67
GFR calc Af Amer: >60
GFR calc Af Amer: >60
GFR calc Af Amer: >60
GFR calc Af Amer: >60
GFR calc non Af Amer: >60
GFR calc non Af Amer: >60
GFR calc non Af Amer: >60
GFR calc non Af Amer: >60
GFR calc non Af Amer: >60
Glucose, Bld: 109 — ABNORMAL HIGH
Glucose, Bld: 156 — ABNORMAL HIGH
Potassium: 3.4 — ABNORMAL LOW
Potassium: 3.6
Potassium: 3.6
Potassium: 4.2
Sodium: 131 — ABNORMAL LOW
Sodium: 131 — ABNORMAL LOW
Sodium: 131 — ABNORMAL LOW
Sodium: 133 — ABNORMAL LOW
Sodium: 137

## 2011-01-03 LAB — CBC
HCT: 24.6 — ABNORMAL LOW
HCT: 24.7 — ABNORMAL LOW
HCT: 25.2 — ABNORMAL LOW
Hemoglobin: 8.7 — ABNORMAL LOW
Hemoglobin: 8.7 — ABNORMAL LOW
Hemoglobin: 8.9 — ABNORMAL LOW
Hemoglobin: 8.9 — ABNORMAL LOW
Hemoglobin: 9.4 — ABNORMAL LOW
MCHC: 34.6
MCHC: 35.8
MCV: 94.1
MCV: 94.2
MCV: 95.3
Platelets: 221
Platelets: 258
RBC: 2.62 — ABNORMAL LOW
RBC: 2.64 — ABNORMAL LOW
RBC: 2.74 — ABNORMAL LOW
RBC: 2.9 — ABNORMAL LOW
RDW: 11.8
RDW: 11.8
RDW: 12.3
WBC: 7.1
WBC: 7.2
WBC: 7.7
WBC: 8.2

## 2011-01-03 LAB — PROTIME-INR
INR: 1.2
INR: 1.6 — ABNORMAL HIGH
INR: 1.7 — ABNORMAL HIGH
Prothrombin Time: 15.6 — ABNORMAL HIGH
Prothrombin Time: 19.6 — ABNORMAL HIGH
Prothrombin Time: 20.2 — ABNORMAL HIGH
Prothrombin Time: 20.7 — ABNORMAL HIGH

## 2011-01-03 LAB — TYPE AND SCREEN
ABO/RH(D): A POS
Antibody Screen: NEGATIVE

## 2011-01-03 LAB — ABO/RH: ABO/RH(D): A POS

## 2011-01-07 ENCOUNTER — Ambulatory Visit: Payer: Medicare Other | Attending: Family Medicine

## 2011-01-07 DIAGNOSIS — R5381 Other malaise: Secondary | ICD-10-CM | POA: Insufficient documentation

## 2011-01-07 DIAGNOSIS — Z96659 Presence of unspecified artificial knee joint: Secondary | ICD-10-CM | POA: Insufficient documentation

## 2011-01-07 DIAGNOSIS — IMO0001 Reserved for inherently not codable concepts without codable children: Secondary | ICD-10-CM | POA: Insufficient documentation

## 2011-01-07 DIAGNOSIS — R262 Difficulty in walking, not elsewhere classified: Secondary | ICD-10-CM | POA: Insufficient documentation

## 2011-01-09 ENCOUNTER — Ambulatory Visit (INDEPENDENT_AMBULATORY_CARE_PROVIDER_SITE_OTHER): Payer: Medicare Other | Admitting: Licensed Clinical Social Worker

## 2011-01-09 DIAGNOSIS — F4323 Adjustment disorder with mixed anxiety and depressed mood: Secondary | ICD-10-CM

## 2011-01-20 LAB — LIPID PANEL: Cholesterol: 202 — ABNORMAL HIGH

## 2011-01-21 ENCOUNTER — Ambulatory Visit: Payer: Medicare Other | Admitting: Family Medicine

## 2011-01-21 LAB — CBC
HCT: 33.9 — ABNORMAL LOW
Hemoglobin: 11.9 — ABNORMAL LOW
MCV: 94.9
RBC: 3.58 — ABNORMAL LOW
WBC: 5.2

## 2011-01-21 LAB — URINALYSIS, ROUTINE W REFLEX MICROSCOPIC
Bilirubin Urine: NEGATIVE
Glucose, UA: NEGATIVE
Ketones, ur: NEGATIVE
Protein, ur: NEGATIVE

## 2011-01-21 LAB — DIFFERENTIAL
Basophils Absolute: 0
Basophils Relative: 1
Eosinophils Relative: 2
Lymphocytes Relative: 33
Neutro Abs: 2.7

## 2011-01-21 LAB — COMPREHENSIVE METABOLIC PANEL
Alkaline Phosphatase: 43
BUN: 7
CO2: 26
Chloride: 98
Creatinine, Ser: 0.6
GFR calc non Af Amer: >60
Total Bilirubin: 0.7

## 2011-01-21 LAB — PROTIME-INR: Prothrombin Time: 13.9

## 2011-01-21 LAB — HEMOGLOBIN A1C: Hgb A1c MFr Bld: 7.1 — ABNORMAL HIGH

## 2011-01-21 LAB — POCT CARDIAC MARKERS
CKMB, poc: 3.3
Myoglobin, poc: 110
Operator id: 133351
Troponin i, poc: <0.05

## 2011-01-21 LAB — URINE MICROSCOPIC-ADD ON

## 2011-01-23 ENCOUNTER — Ambulatory Visit (INDEPENDENT_AMBULATORY_CARE_PROVIDER_SITE_OTHER): Payer: Medicare Other

## 2011-01-23 DIAGNOSIS — Z23 Encounter for immunization: Secondary | ICD-10-CM

## 2011-01-24 ENCOUNTER — Other Ambulatory Visit: Payer: Self-pay | Admitting: Oncology

## 2011-01-24 ENCOUNTER — Encounter (HOSPITAL_BASED_OUTPATIENT_CLINIC_OR_DEPARTMENT_OTHER): Payer: Medicare Other | Admitting: Oncology

## 2011-01-24 DIAGNOSIS — Z170 Estrogen receptor positive status [ER+]: Secondary | ICD-10-CM

## 2011-01-24 DIAGNOSIS — E559 Vitamin D deficiency, unspecified: Secondary | ICD-10-CM

## 2011-01-24 DIAGNOSIS — C50119 Malignant neoplasm of central portion of unspecified female breast: Secondary | ICD-10-CM

## 2011-01-24 LAB — CBC & DIFF AND RETIC
BASO%: 0.4 % (ref 0.0–2.0)
EOS%: 1.7 % (ref 0.0–7.0)
Immature Retic Fract: 5.7 % (ref 1.60–10.00)
MCH: 30.3 pg (ref 25.1–34.0)
MCHC: 33.4 g/dL (ref 31.5–36.0)
NEUT%: 58.6 % (ref 38.4–76.8)
RDW: 13.8 % (ref 11.2–14.5)
lymph#: 2.3 10*3/uL (ref 0.9–3.3)

## 2011-01-24 LAB — MORPHOLOGY

## 2011-01-24 LAB — CHCC SMEAR

## 2011-01-27 ENCOUNTER — Ambulatory Visit (INDEPENDENT_AMBULATORY_CARE_PROVIDER_SITE_OTHER): Payer: Medicare Other | Admitting: Licensed Clinical Social Worker

## 2011-01-27 DIAGNOSIS — F4323 Adjustment disorder with mixed anxiety and depressed mood: Secondary | ICD-10-CM

## 2011-01-28 LAB — PROTEIN ELECTROPHORESIS, SERUM
Albumin ELP: 50.1 % — ABNORMAL LOW (ref 55.8–66.1)
Alpha-1-Globulin: 4.3 % (ref 2.9–4.9)
Alpha-2-Globulin: 12.7 % — ABNORMAL HIGH (ref 7.1–11.8)
Gamma Globulin: 20.4 % — ABNORMAL HIGH (ref 11.1–18.8)
Total Protein, Serum Electrophoresis: 7.5 g/dL (ref 6.0–8.3)

## 2011-01-28 LAB — IRON AND TIBC
%SAT: 21 % (ref 20–55)
Iron: 60 ug/dL (ref 42–145)
TIBC: 285 ug/dL (ref 250–470)

## 2011-01-28 LAB — FOLATE: Folate: >20 ng/mL

## 2011-01-28 LAB — FERRITIN: Ferritin: 132 ng/mL (ref 10–291)

## 2011-02-10 ENCOUNTER — Ambulatory Visit (INDEPENDENT_AMBULATORY_CARE_PROVIDER_SITE_OTHER): Payer: Medicare Other | Admitting: Family Medicine

## 2011-02-10 ENCOUNTER — Encounter: Payer: Self-pay | Admitting: Family Medicine

## 2011-02-10 DIAGNOSIS — R609 Edema, unspecified: Secondary | ICD-10-CM

## 2011-02-10 DIAGNOSIS — I10 Essential (primary) hypertension: Secondary | ICD-10-CM

## 2011-02-10 DIAGNOSIS — E871 Hypo-osmolality and hyponatremia: Secondary | ICD-10-CM

## 2011-02-10 LAB — BASIC METABOLIC PANEL
CO2: 27 mEq/L (ref 19–32)
Calcium: 9.4 mg/dL (ref 8.4–10.5)
Chloride: 97 mEq/L (ref 96–112)
Glucose, Bld: 149 mg/dL — ABNORMAL HIGH (ref 70–99)
Potassium: 4.4 mEq/L (ref 3.5–5.1)
Sodium: 134 mEq/L — ABNORMAL LOW (ref 135–145)

## 2011-02-10 NOTE — Progress Notes (Signed)
  Subjective:    Patient ID: [name] [name], female    DOB: 1932-05-08, 77 y.o.   MRN: [id number]  HPI  Medical followup. She has multiple chronic medical problems. Recent lab work per hematology with serum protein electrophoresis, folate, serum ferritin, and serum iron all basically unremarkable. She has chronic normocytic anemia.  Recent issues with decreased ambulation. She's had acute vestibular neuritis and had some progressive lack of ambulation over past few years. We sent her for psychological counseling and physical therapy this helped somewhat.  Ambulating more with walker. No recent falls.  Hypertension with poor control.  Blood pressure has consistently improved with diuretics but she's had recurrent hyponatremia. Recent blood sodium 129. Denies any nausea or vomiting. No weakness. No confusion. Compliant with other blood pressure medications.  Denies headaches, dyspnea, or chest pain.  Past Medical History  Diagnosis Date  . HYPOTHYROIDISM 07/24/2008  . HYPERLIPIDEMIA 07/24/2008  . DEPRESSION 05/16/2009  . HYPERTENSION 07/24/2008   Past Surgical History  Procedure Date  . Knee surgery 2009    TKR  . Cholecystectomy 1980  . Breast surgery 2007    biopsy, cancer    reports that she quit smoking about 28 years ago. Her smoking use included Cigarettes. She has a 6 pack-year smoking history. She does not have any smokeless tobacco history on file. Her alcohol and drug histories not on file. family history includes Arthritis in her mother; Cancer in her sister; and Heart disease in her father. Allergies  Allergen Reactions  . Amoxicillin     GI upset  . Codeine Sulfate     REACTION: GI upset  . Sulfonamide Derivatives     REACTION: GI upset      Review of Systems  Constitutional: Negative for fever and chills.  Eyes: Negative for visual disturbance.  Respiratory: Negative for cough, shortness of breath and wheezing.   Cardiovascular: Positive for leg swelling. Negative for  chest pain.  Genitourinary: Negative for dysuria.  Neurological: Negative for headaches.  Psychiatric/Behavioral: Negative for dysphoric mood.       Objective:   Physical Exam  Constitutional: She is oriented to person, place, and time. She appears well-developed and well-nourished.  Neck: Neck supple.  Cardiovascular: Normal rate and regular rhythm.   Murmur heard. Pulmonary/Chest: Effort normal and breath sounds normal. No respiratory distress. She has no wheezes. She has no rales.  Musculoskeletal: She exhibits edema.       Trace pitting edema ankles bilaterally  Neurological: She is alert and oriented to person, place, and time.  Psychiatric: She has a normal mood and affect. Her behavior is normal.          Assessment & Plan:  #1 hypertension. Marginal control. Recheck basic metabolic panel.  If sodium stable consider every other day furosemide. Provided samples of amlodipine/olmesartan.  #2 mild peripheral edema. Treatment as above  #3 history of recurrent hyponatremia. Recheck basic metabolic panel

## 2011-02-10 NOTE — Patient Instructions (Signed)
We will call you with labs and if sodium is back up we will try Lasix one every other day.

## 2011-02-11 NOTE — Progress Notes (Signed)
Quick Note:  Pt informed ______ 

## 2011-02-17 ENCOUNTER — Ambulatory Visit: Payer: Medicare Other | Admitting: Family Medicine

## 2011-02-17 ENCOUNTER — Other Ambulatory Visit: Payer: Self-pay | Admitting: Family Medicine

## 2011-02-17 ENCOUNTER — Ambulatory Visit (INDEPENDENT_AMBULATORY_CARE_PROVIDER_SITE_OTHER): Payer: Medicare Other | Admitting: Licensed Clinical Social Worker

## 2011-02-17 DIAGNOSIS — F4323 Adjustment disorder with mixed anxiety and depressed mood: Secondary | ICD-10-CM

## 2011-02-19 NOTE — Telephone Encounter (Signed)
Dr Elease Hashimoto pt, he is out of office. Diazepam refill request, last filled 09/20/10, #30 with 3 refills May take 1 tab daily prn Please advise and Thank you

## 2011-02-19 NOTE — Telephone Encounter (Signed)
ok 

## 2011-03-04 ENCOUNTER — Telehealth: Payer: Self-pay | Admitting: Family Medicine

## 2011-03-04 NOTE — Telephone Encounter (Signed)
Please advise 

## 2011-03-04 NOTE — Telephone Encounter (Signed)
Had breast cancer 5 years ago. Is she able to get a shingles shot due to this issue? Thanks.

## 2011-03-04 NOTE — Telephone Encounter (Signed)
Per pt request and Insurance directives, Zostavax Vaccine Rx faxed to [facility], confirmation received, pt aware.

## 2011-03-04 NOTE — Telephone Encounter (Signed)
Yes.   Only reason not to would be if she was on active chemotherapy.

## 2011-03-08 ENCOUNTER — Telehealth: Payer: Self-pay | Admitting: Oncology

## 2011-03-08 NOTE — Telephone Encounter (Signed)
per por 06/19 called pt scheduled her appts with our office for [id number].  I reminded pt to schedule her mammo and bone density scan appts for [id number]

## 2011-03-11 ENCOUNTER — Encounter: Payer: Self-pay | Admitting: Family Medicine

## 2011-03-11 ENCOUNTER — Ambulatory Visit (INDEPENDENT_AMBULATORY_CARE_PROVIDER_SITE_OTHER): Payer: Medicare Other | Admitting: Family Medicine

## 2011-03-11 VITALS — BP 150/78 | Temp 98.7°F | Wt 208.0 lb

## 2011-03-11 DIAGNOSIS — M791 Myalgia, unspecified site: Secondary | ICD-10-CM

## 2011-03-11 DIAGNOSIS — IMO0001 Reserved for inherently not codable concepts without codable children: Secondary | ICD-10-CM

## 2011-03-11 DIAGNOSIS — I10 Essential (primary) hypertension: Secondary | ICD-10-CM

## 2011-03-11 DIAGNOSIS — E039 Hypothyroidism, unspecified: Secondary | ICD-10-CM

## 2011-03-11 LAB — CBC WITH DIFFERENTIAL/PLATELET
Basophils Absolute: 0 10*3/uL (ref 0.0–0.1)
Eosinophils Absolute: 0.2 10*3/uL (ref 0.0–0.7)
Hemoglobin: 9.9 g/dL — ABNORMAL LOW (ref 12.0–15.0)
Lymphocytes Relative: 21.9 % (ref 12.0–46.0)
Lymphs Abs: 2 10*3/uL (ref 0.7–4.0)
MCHC: 34.2 g/dL (ref 30.0–36.0)
MCV: 91.9 fl (ref 78.0–100.0)
Monocytes Absolute: 1 10*3/uL (ref 0.1–1.0)
Neutro Abs: 6.1 10*3/uL (ref 1.4–7.7)
RDW: 13.1 % (ref 11.5–14.6)

## 2011-03-11 NOTE — Progress Notes (Signed)
  Subjective:    Patient ID: [name] [name], female    DOB: [birth year], 77 y.o.   MRN: [id number]  HPI  Multiple complaints. Bilateral ankle swelling which has had intermittently in the past.  Increased fatigue past couple weeks. She complains of diffuse body aches initially left shoulder now more generalized. Mostly myalgias. No evidence for joint inflammation such as erythema, warmth, or redness. She has achiness in both upper and lower extremities and trunk. No fever. Chronic decreased appetite. She's had some nasal drainage and minimal sore throat. No headaches. No visual changes.  Hypertension treated with multiple medications. Compliant with all. Did not take furosemide secondary to nausea.  Past Medical History  Diagnosis Date  . HYPOTHYROIDISM 07/24/2008  . HYPERLIPIDEMIA 07/24/2008  . DEPRESSION 05/16/2009  . HYPERTENSION 07/24/2008   Past Surgical History  Procedure Date  . Knee surgery 2009    TKR  . Cholecystectomy 1980  . Breast surgery 2007    biopsy, cancer    reports that she quit smoking about 28 years ago. Her smoking use included Cigarettes. She has a 6 pack-year smoking history. She does not have any smokeless tobacco history on file. Her alcohol and drug histories not on file. family history includes Arthritis in her mother; Cancer in her sister; and Heart disease in her father. Allergies  Allergen Reactions  . Amoxicillin     GI upset  . Codeine Sulfate     REACTION: GI upset  . Sulfonamide Derivatives     REACTION: GI upset      Review of Systems  Constitutional: Positive for fatigue. Negative for fever, chills and appetite change.  Respiratory: Negative for cough and shortness of breath.   Cardiovascular: Negative for chest pain.  Gastrointestinal: Negative for abdominal pain.  Musculoskeletal: Positive for myalgias. Negative for back pain and joint swelling.  Skin: Negative for rash.  Neurological: Positive for dizziness. Negative for headaches.      Objective:   Physical Exam  Constitutional: She is oriented to person, place, and time. She appears well-developed and well-nourished. No distress.  HENT:  Right Ear: External ear normal.  Left Ear: External ear normal.  Mouth/Throat: Oropharynx is clear and moist.  Neck: Neck supple. No thyromegaly present.  Cardiovascular: Normal rate and regular rhythm.   Pulmonary/Chest: Effort normal and breath sounds normal. No respiratory distress. She has no wheezes. She has no rales.  Musculoskeletal: She exhibits edema.       Trace edema ankles and feet bilaterally  Neurological: She is alert and oriented to person, place, and time.  Psychiatric: She has a normal mood and affect.          Assessment & Plan:  Myalgias. Question viral. Rule out polymyalgia rheumatica. Check sedimentation rate. Repeat TSH with history of hypothyroidism. Check CBC. Hypertension with slight elevation today.  Intolerant of lasix.  Pt needs to lose some weight. Hypothyroidism.  Repeat TSH.

## 2011-03-12 ENCOUNTER — Other Ambulatory Visit: Payer: Self-pay | Admitting: Family Medicine

## 2011-03-12 MED ORDER — PREDNISONE 10 MG PO TABS: 10.0000 mg | ORAL_TABLET | Freq: Every day | ORAL | Status: DC

## 2011-03-12 NOTE — Progress Notes (Signed)
Quick Note:  Pt informed, med sent to pt pharmacy ______

## 2011-03-13 ENCOUNTER — Other Ambulatory Visit: Payer: Self-pay | Admitting: *Deleted

## 2011-03-13 MED ORDER — PREDNISONE 10 MG PO TABS: ORAL_TABLET | ORAL | Status: DC

## 2011-03-13 NOTE — Telephone Encounter (Signed)
Pharmacy wanted clarification of Prednisone sig

## 2011-03-20 ENCOUNTER — Ambulatory Visit (INDEPENDENT_AMBULATORY_CARE_PROVIDER_SITE_OTHER): Payer: Medicare Other | Admitting: Family Medicine

## 2011-03-20 ENCOUNTER — Ambulatory Visit (INDEPENDENT_AMBULATORY_CARE_PROVIDER_SITE_OTHER)
Admission: RE | Admit: 2011-03-20 | Discharge: 2011-03-20 | Disposition: A | Discharge status: Home / Self Care | Payer: Medicare Other | Source: Ambulatory Visit | Attending: Family Medicine | Admitting: Family Medicine

## 2011-03-20 ENCOUNTER — Encounter: Payer: Self-pay | Admitting: Family Medicine

## 2011-03-20 DIAGNOSIS — R9389 Abnormal findings on diagnostic imaging of other specified body structures: Secondary | ICD-10-CM

## 2011-03-20 DIAGNOSIS — M2550 Pain in unspecified joint: Secondary | ICD-10-CM

## 2011-03-20 DIAGNOSIS — R042 Hemoptysis: Secondary | ICD-10-CM

## 2011-03-20 DIAGNOSIS — R918 Other nonspecific abnormal finding of lung field: Secondary | ICD-10-CM

## 2011-03-20 DIAGNOSIS — M353 Polymyalgia rheumatica: Secondary | ICD-10-CM | POA: Insufficient documentation

## 2011-03-20 DIAGNOSIS — IMO0001 Reserved for inherently not codable concepts without codable children: Secondary | ICD-10-CM

## 2011-03-20 MED ORDER — PREDNISONE 10 MG PO TABS: ORAL_TABLET | ORAL | Status: DC

## 2011-03-20 MED ORDER — AMLODIPINE-OLMESARTAN 5-40 MG PO TABS: 1.0000 | ORAL_TABLET | Freq: Every day | ORAL | Status: DC

## 2011-03-20 NOTE — Patient Instructions (Signed)
Take prednisone 2 daily for 2 weeks then decrease to 1 and 1/2 daily until follow up.

## 2011-03-20 NOTE — Progress Notes (Addendum)
  Subjective:    Patient ID: [name] [name], female    DOB: 10-Apr-1932, 77 y.o.   MRN: [id number]  HPI  Medical followup. Patient recently had severe body aches and we suspected possible polymyalgia rheumatica. She had extreme fatigue. Sedimentation rate 120. After taking prednisone 20 mg the following day she almost total 100% relief of her symptoms. After tapering back to 10 mg she's had some recurrence of extreme body aches. No visual symptoms. No headache.  New symptom of hemoptysis of "dark appearing blood" early morning past few days. Denies any fever or chills. Decreased appetite but no weight change. No dyspnea. No pleuritic pain. No peripheral edema. Ex-smoker quit 1984.  Past Medical History  Diagnosis Date  . HYPOTHYROIDISM 07/24/2008  . HYPERLIPIDEMIA 07/24/2008  . DEPRESSION 05/16/2009  . HYPERTENSION 07/24/2008   Past Surgical History  Procedure Date  . Knee surgery 2009    TKR  . Cholecystectomy 1980  . Breast surgery 2007    biopsy, cancer    reports that she quit smoking about 28 years ago. Her smoking use included Cigarettes. She has a 6 pack-year smoking history. She does not have any smokeless tobacco history on file. Her alcohol and drug histories not on file. family history includes Arthritis in her mother; Cancer in her sister; and Heart disease in her father. Allergies  Allergen Reactions  . Amoxicillin     GI upset  . Codeine Sulfate     REACTION: GI upset  . Sulfonamide Derivatives     REACTION: GI upset     Review of Systems  Constitutional: Positive for appetite change. Negative for fever and chills.  HENT: Negative for trouble swallowing.   Respiratory: Positive for cough. Negative for shortness of breath and wheezing.   Cardiovascular: Negative for chest pain, palpitations and leg swelling.  Gastrointestinal: Negative for blood in stool.  Genitourinary: Negative for dysuria.  Musculoskeletal: Positive for myalgias. Negative for joint swelling.    Neurological: Negative for dizziness and syncope.  Hematological: Negative for adenopathy.       Objective:   Physical Exam  Constitutional: She is oriented to person, place, and time.  HENT:  Mouth/Throat: Oropharynx is clear and moist.  Neck: Neck supple.  Cardiovascular: Normal rate and regular rhythm.   Murmur heard. Pulmonary/Chest: Effort normal and breath sounds normal. No respiratory distress. She has no wheezes. She has no rales.  Musculoskeletal: She exhibits no edema.  Lymphadenopathy:    She has no cervical adenopathy.  Neurological: She is alert and oriented to person, place, and time.          Assessment & Plan:  #1 recent myalgias and arthralgias. Very high sedimentation rate. Almost total response to low-dose prednisone suggest highly polymyalgia rheumatica.  No headaches or visual symptoms to suggest temporal arteritis.  Go back to 20 mg daily with prednisone and watch blood sugars closely. After 2 weeks try tapering to 15 mg. Repeat sedimentation rate today #2 hemoptysis. Doubt pulmonary embolus clinically. Check chest x-ray in this ex-smoker.  CXR multifocal airspace disease-?bilateral  Pneumonia with ?underlying malignancy.  Set up CT with contrast to further assess.  She does not have any fever to suggest infectious origin.  Spoke with pt.  No fever but occasional chills.  Start Levaquin 500 mg daily for 10 days pending CT chest.  No pleuritic pain or dyspnea.

## 2011-03-21 ENCOUNTER — Telehealth: Payer: Self-pay | Admitting: *Deleted

## 2011-03-21 LAB — D-DIMER, QUANTITATIVE: D-Dimer, Quant: 2.58 ug/mL-FEU — ABNORMAL HIGH (ref 0.00–0.48)

## 2011-03-21 NOTE — Telephone Encounter (Signed)
Showed the report to Dr. Elease Hashimoto on Monday, so he can arrange for follow-up

## 2011-03-21 NOTE — Telephone Encounter (Signed)
Sed rate fax received, sed rate 120.  Dr Elease Hashimoto out of office, will forward to Dr Sherren Mocha

## 2011-03-23 NOTE — Progress Notes (Signed)
Addended by: [name] on: 03/23/2011 03:08 PM   Modules accepted: Orders

## 2011-03-23 NOTE — Telephone Encounter (Signed)
I will call pt Monday and arrange for CT chest

## 2011-03-24 ENCOUNTER — Ambulatory Visit (INDEPENDENT_AMBULATORY_CARE_PROVIDER_SITE_OTHER)
Admission: RE | Admit: 2011-03-24 | Discharge: 2011-03-24 | Disposition: A | Discharge status: Home / Self Care | Payer: Medicare Other | Source: Ambulatory Visit | Attending: Cardiology | Admitting: Cardiology

## 2011-03-24 DIAGNOSIS — R9389 Abnormal findings on diagnostic imaging of other specified body structures: Secondary | ICD-10-CM

## 2011-03-24 DIAGNOSIS — R918 Other nonspecific abnormal finding of lung field: Secondary | ICD-10-CM

## 2011-03-24 MED ORDER — IOHEXOL 300 MG/ML  SOLN
80.0000 mL | Freq: Once | INTRAMUSCULAR | Status: AC | PRN
  Administered 2011-03-24: 80 mL via INTRAVENOUS

## 2011-03-24 MED ORDER — LEVOFLOXACIN 500 MG PO TABS: 500.0000 mg | ORAL_TABLET | Freq: Every day | ORAL | Status: DC

## 2011-03-24 NOTE — Progress Notes (Signed)
Addended by: [name] on: 03/24/2011 08:28 AM   Modules accepted: Orders

## 2011-03-28 ENCOUNTER — Ambulatory Visit (INDEPENDENT_AMBULATORY_CARE_PROVIDER_SITE_OTHER): Payer: Medicare Other | Admitting: Family Medicine

## 2011-03-28 ENCOUNTER — Encounter: Payer: Self-pay | Admitting: Family Medicine

## 2011-03-28 VITALS — BP 140/72 | Temp 98.3°F | Wt 202.0 lb

## 2011-03-28 DIAGNOSIS — M353 Polymyalgia rheumatica: Secondary | ICD-10-CM

## 2011-03-28 DIAGNOSIS — R05 Cough: Secondary | ICD-10-CM

## 2011-03-28 DIAGNOSIS — E119 Type 2 diabetes mellitus without complications: Secondary | ICD-10-CM

## 2011-03-28 DIAGNOSIS — R9389 Abnormal findings on diagnostic imaging of other specified body structures: Secondary | ICD-10-CM

## 2011-03-28 DIAGNOSIS — R700 Elevated erythrocyte sedimentation rate: Secondary | ICD-10-CM

## 2011-03-28 DIAGNOSIS — R918 Other nonspecific abnormal finding of lung field: Secondary | ICD-10-CM

## 2011-03-28 NOTE — Patient Instructions (Signed)
Go for CXR in 2 weeks. Follow up immediately for any fever, coughing up blood, or shortness of breath

## 2011-03-28 NOTE — Progress Notes (Signed)
  Subjective:    Patient ID: [name] [name], female    DOB: 03-07-1933, 77 y.o.   MRN: [id number]  HPI  Followup from recent pneumonia. Patient had presented here with extreme myalgias and chronic pain along with bilateral shoulder hip pain. Very high sedimentation rate of 120.  She almost total resolution of myalgias with low dose prednisone strongly suggesting polymyalgia rheumatica. She subsequently presented with cough with trace of blood but no dyspnea, fever, reported a pain. Chest x-ray showed bilateral infiltrates suggesting probable bilateral pneumonia the recommendation for CT scan. CT scan revealed no adenopathy and no evidence for a lung mass. Compatible with bilateral pneumonia. Patient placed on antibiotic and much better at this time with no fever and essentially no cough. Her muscle and joint pains are almost 100% resolved with low-dose prednisone.  She did present back in September with severe sinus disease suggesting acute sinusitis. CT revealed no acute findings. She denies any arthralgias at this time. No skin rash. No fever.   Review of Systems  Constitutional: Negative for fever, chills and fatigue.  Respiratory: Negative for cough and shortness of breath.   Cardiovascular: Negative for chest pain, palpitations and leg swelling.  Gastrointestinal: Negative for abdominal pain.  Neurological: Negative for dizziness and headaches.       Objective:   Physical Exam  Constitutional: She is oriented to person, place, and time. She appears well-developed and well-nourished. No distress.  HENT:  Right Ear: External ear normal.  Left Ear: External ear normal.  Mouth/Throat: Oropharynx is clear and moist.  Neck: Neck supple.  Cardiovascular: Normal rate and regular rhythm.   Pulmonary/Chest: Effort normal. No respiratory distress. She has no wheezes. She exhibits no tenderness.       Few faint rales in her left upper anterior chest otherwise clear  Musculoskeletal: She exhibits  no edema.  Lymphadenopathy:    She has no cervical adenopathy.  Neurological: She is alert and oriented to person, place, and time.          Assessment & Plan:  #1 recent elevated sedimentation rate. Possibly multifactorial with probable bilateral pneumonia and strongly suspicious for polymyalgia rheumatica. Repeat today.  #2 probable bilateral pneumonia. She did not have any elevated white count with initial presentation but has responded very well to antibiotics. Given her recent issues with a high sedimentation rate, ill defined respiratory illness and recent sinus disease check ANCA.  We'll also check rheumatoid factor, repeat sed rate, and ANA. #3 type 2 diabetes. Get back on metformin which was d/ced recently for her contrast with CT scan #4 probable polymyalgia rheumatica.  Pains/aches essentially resolved with low dose prednisone.

## 2011-03-29 LAB — RHEUMATOID FACTOR: Rhuematoid fact SerPl-aCnc: 18 IU/mL — ABNORMAL HIGH (ref <=14)

## 2011-03-31 LAB — ANA: Anti Nuclear Antibody(ANA): NEGATIVE

## 2011-04-03 ENCOUNTER — Encounter: Payer: Self-pay | Admitting: Internal Medicine

## 2011-04-03 ENCOUNTER — Encounter: Payer: Self-pay | Admitting: Family Medicine

## 2011-04-03 ENCOUNTER — Ambulatory Visit (INDEPENDENT_AMBULATORY_CARE_PROVIDER_SITE_OTHER): Payer: Medicare Other | Admitting: Family Medicine

## 2011-04-03 ENCOUNTER — Ambulatory Visit (INDEPENDENT_AMBULATORY_CARE_PROVIDER_SITE_OTHER): Payer: Medicare Other | Admitting: Internal Medicine

## 2011-04-03 VITALS — BP 124/76 | HR 70 | Ht 67.5 in | Wt 202.2 lb

## 2011-04-03 DIAGNOSIS — J189 Pneumonia, unspecified organism: Secondary | ICD-10-CM

## 2011-04-03 DIAGNOSIS — R5383 Other fatigue: Secondary | ICD-10-CM

## 2011-04-03 DIAGNOSIS — M3130 Wegener's granulomatosis without renal involvement: Secondary | ICD-10-CM

## 2011-04-03 DIAGNOSIS — J181 Lobar pneumonia, unspecified organism: Secondary | ICD-10-CM

## 2011-04-03 DIAGNOSIS — R531 Weakness: Secondary | ICD-10-CM

## 2011-04-03 DIAGNOSIS — J13 Pneumonia due to Streptococcus pneumoniae: Secondary | ICD-10-CM

## 2011-04-03 NOTE — Patient Instructions (Signed)
1. Patient will followup with the pulmonologist today at 3:45 PM for further management. Understanding verbalized.

## 2011-04-03 NOTE — Progress Notes (Signed)
Subjective:    Patient ID: [name] [name], female    DOB: [birth year], 77 y.o.   MRN: [id number]  HPI 77 year old white female, former smoker, patient of Dr. Elease Hashimoto is in with bilateral pneumonia confirmed by CT scan. She was started on Levaquin by Dr. Elease Hashimoto she is 9 days into the course and continues to feel horrible. Reports sore throat, nausea, cough, and congestion. CT scan also shows a possible pulmonary hemorrhage. Her husband has grown increasingly concerned with her weakness and fatigue. She has difficulty ambulating.   Review of Systems  Constitutional: Positive for appetite change and fatigue.  HENT: Positive for congestion.   Eyes: Negative.   Respiratory: Positive for cough.   Cardiovascular: Negative.   Gastrointestinal: Negative.   Genitourinary: Negative.   Musculoskeletal: Positive for gait problem.  Skin: Negative.   Neurological: Positive for weakness and light-headedness.  Hematological: Negative.   Psychiatric/Behavioral: Negative.    Past Medical History  Diagnosis Date  . HYPOTHYROIDISM 07/24/2008  . HYPERLIPIDEMIA 07/24/2008  . DEPRESSION 05/16/2009  . HYPERTENSION 07/24/2008    History   Social History  . Marital Status: Married    Spouse Name: N/A    Number of Children: N/A  . Years of Education: N/A   Occupational History  . Not on file.   Social History Main Topics  . Smoking status: Former Smoker -- 0.5 packs/day for 12 years    Types: Cigarettes    Quit date: 06/04/1982  . Smokeless tobacco: Not on file  . Alcohol Use: Not on file  . Drug Use: Not on file  . Sexually Active: Not on file   Other Topics Concern  . Not on file   Social History Narrative  . No narrative on file    Past Surgical History  Procedure Date  . Knee surgery 2009    TKR  . Cholecystectomy 1980  . Breast surgery 2007    biopsy, cancer    Family History  Problem Relation Age of Onset  . Arthritis Mother   . Heart disease Father   . Cancer Sister     breast CA, both sisters    Allergies  Allergen Reactions  . Amoxicillin     GI upset  . Codeine Sulfate     REACTION: GI upset  . Sulfonamide Derivatives     REACTION: GI upset    Current Outpatient Prescriptions on File Prior to Visit  Medication Sig Dispense Refill  . amLODipine-olmesartan (AZOR) 5-40 MG per tablet Take 1 tablet by mouth daily.  30 tablet  5  . aspirin 81 MG tablet Take 81 mg by mouth daily.        Marland Kitchen BYSTOLIC 10 MG tablet TAKE ONE TABLET BY MOUTH EVERY DAY  30 each  11  . calcium citrate (CALCITRATE) 950 MG tablet Take 1 tablet by mouth daily.        . cyanocobalamin 100 MCG tablet Take 100 mcg by mouth daily.        . diazepam (VALIUM) 5 MG tablet TAKE ONE TABLET BY MOUTH EVERY DAY AS NEEDED  30 tablet  2  . ergocalciferol (VITAMIN D2) 50000 UNITS capsule Take 50,000 Units by mouth once a week.        . FeFum-FePo-FA-B Cmp-C-Zn-Mn-Cu (RE DUALVIT PLUS) 162-115.2-1 MG CAPS TAKE ONE CAPSULE BY MOUTH EVERY DAY  30 each  10  . fluticasone (FLONASE) 50 MCG/ACT nasal spray Place 2 sprays into the nose daily.  16 g  12  .  furosemide (LASIX) 20 MG tablet       . glipiZIDE (GLUCOTROL) 10 MG tablet TAKE ONE TABLET BY MOUTH TWICE DAILY  180 tablet  3  . L-Methylfolate-B6-B12 (METANX) 3-35-2 MG TABS Take one tab by mouth daily  90 tablet  3  . levofloxacin (LEVAQUIN) 500 MG tablet Take 1 tablet (500 mg total) by mouth daily.  10 tablet  0  . levothyroxine (LEVOTHROID) 125 MCG tablet Take 1 tablet (125 mcg total) by mouth daily.  30 tablet  11  . metFORMIN (GLUCOPHAGE-XR) 500 MG 24 hr tablet Take 500 mg by mouth. Two tabs two times a day (must equal 1000 mg two times daily)       . Multiple Vitamins-Minerals (CENTRUM ULTRA WOMENS) TABS Take by mouth daily.        . predniSONE (DELTASONE) 10 MG tablet Take as directed  100 tablet  1  . pyridoxine (B-6) 200 MG tablet Take 200 mg by mouth daily.        . rosuvastatin (CRESTOR) 5 MG tablet Take 5 mg by mouth daily.        Marland Kitchen  scopolamine (TRANSDERM-SCOP) 1.5 MG Place 1 patch (1.5 mg total) onto the skin every 3 (three) days.  4 patch  3    BP 130/78  Pulse 68  Temp(Src) 98.4 F (36.9 C) (Oral)  Wt 200 lb (90.719 kg)  SpO2 95%chart    Objective:   Physical Exam  Constitutional: She is oriented to person, place, and time. She appears well-developed.       Appears weak.  HENT:  Head: Normocephalic.  Right Ear: External ear normal.  Left Ear: External ear normal.  Neck: Normal range of motion. Neck supple.  Cardiovascular: Normal rate, regular rhythm and normal heart sounds.   Pulmonary/Chest: Effort normal and breath sounds normal.  Abdominal: Soft. Bowel sounds are normal.  Neurological: She is alert and oriented to person, place, and time.  Skin: Skin is warm and dry.  Psychiatric: She has a normal mood and affect.          Assessment & Plan:  Assessment: Bilateral pneumonia, possible pulmonary hemorrhage  Plan: Referral to pulmonary as soon as possible, appointments for 345 today. Husband will be sure that she gets to this appointment. Further management of the pneumonia and possible pulmonary hemorrhage will be turned over to a pulmonologist for further management.

## 2011-04-03 NOTE — Patient Instructions (Signed)
Order- refer to Thoracic Surgery for VATS lung biopsy- suspect Wegener's Granulomatosis

## 2011-04-04 ENCOUNTER — Telehealth: Payer: Self-pay | Admitting: Internal Medicine

## 2011-04-04 NOTE — Telephone Encounter (Signed)
I spoke with pt and she states at the top her check out sheet it had furture orders and listed was a cxr. She states she was not advised that she needed this yesterday. I advised her that it was not ordered by Dr. Annamaria Boots but maybe by her pcp. She voiced her understanding and had no further questions

## 2011-04-05 ENCOUNTER — Encounter: Payer: Self-pay | Admitting: Internal Medicine

## 2011-04-05 DIAGNOSIS — J189 Pneumonia, unspecified organism: Secondary | ICD-10-CM | POA: Insufficient documentation

## 2011-04-05 NOTE — Progress Notes (Signed)
04/03/11- 77 year old female former smoking history referred courtesy of Dr. Elease Hashimoto with concern of abnormal chest CT. Husband is an allergy patient here. Dr. Erick Blinks recent office note reviewed "Followup from recent pneumonia. Patient had presented here with extreme myalgias and chronic pain along with bilateral shoulder hip pain. Very high sedimentation rate of 120.  She almost total resolution of myalgias with low dose prednisone strongly suggesting polymyalgia rheumatica. She subsequently presented with cough with trace of blood but no dyspnea, fever, reported a pain. Chest x-ray showed bilateral infiltrates suggesting probable bilateral pneumonia the recommendation for CT scan. CT scan revealed no adenopathy and no evidence for a lung mass. Compatible with bilateral pneumonia. Patient placed on antibiotic and much better at this time with no fever and essentially no cough. Her muscle and joint pains are almost 100% resolved with low-dose prednisone. She did present back in September with severe sinus disease suggesting acute sinusitis. CT revealed no acute findings. She denies any arthralgias at this time. No skin rash. No fever." She has been on prednisone 10 mg twice daily since December 13 and feels significantly better. Morning cough has cleared with residual trace phlegm and trace blood. No chest pain. CT chest 03/23/2011 -images reviewed by me-bilateral patchy airspace disease with air bronchograms. No cavitation or definite nodularity seen. She has had right total knee replacement but no generalized arthritis. Childhood exposure to an uncle with tuberculosis. Her PPD skin test was negative. She denies history of lung disease, GERD, DVT, kidney or liver disease. Lab- C-ANCA POS 1:320, P-ANCA NEG. RA 1:18, ANA NEG  ROS-see HPI Constitutional:   No-   weight loss, night sweats, fevers, chills, fatigue, lassitude. HEENT:   No-  headaches, difficulty swallowing, tooth/dental problems, sore  throat,       No-  sneezing, itching, ear ache, nasal congestion, post nasal drip,  CV:  No-   chest pain, orthopnea, PND, swelling in lower extremities, anasarca,      +dizziness,   No-palpitations Resp: No-   shortness of breath with exertion or at rest.              Minimal  productive cough,  No non-productive cough,  Trace + coughing up of blood.              No-   change in color of mucus.  No- wheezing.   Skin: No-   rash or lesions. GI:  No-   heartburn, indigestion, abdominal pain, nausea, vomiting, diarrhea,                 change in bowel habits, loss of appetite GU: No-   dysuria, change in color of urine, no urgency or frequency.  No- flank pain. MS:  No-   joint pain or swelling.  No- decreased range of motion.  No- back pain. Neuro-     Vertigo Psych:  No- change in mood or affect. No depression or anxiety.  No memory loss.  OBJ General- Alert, Oriented, Affect-appropriate, Distress- none acute Skin- rash-none, lesions- none, excoriation- none Lymphadenopathy- none Head- atraumatic            Eyes- Gross vision intact, PERRLA, conjunctivae -pale            Ears- Hearing, canals-normal            Nose- turbinate edema, no-Septal dev, mucus, polyps, erosion, perforation             Throat- Mallampati II-III , mucosa clear , drainage- none,  tonsils- atrophic Neck- flexible , trachea midline, no stridor , thyroid nl, carotid no bruit Chest - symmetrical excursion , unlabored           Heart/CV- RRR , 2-3/6 precordial systolic murmur , no gallop  , no rub, nl s1 s2                           - JVD- none , edema- none, stasis changes- none, varices- none           Lung- clear to P&A, wheeze- none, cough- none , dullness-none, rub- none           Chest wall-  Abd- tender-no, distended-no, bowel sounds-present, HSM- no Br/ Gen/ Rectal- Not done, not indicated Extrem- cyanosis- none, clubbing, none, atrophy- none, strength- nl. Wheelchair for distance/ cane at home.Limited  mobility R shoulder -extension limited by pain. Neuro- grossly intact to observation

## 2011-04-05 NOTE — Assessment & Plan Note (Signed)
Strongly suspect Wegener's Granulomatosis or a similar vasculitis. Bronchoscopy yield is poor for diagnostic vessels.  I have discussed and recommended direct Thoracic Surgical referral for VATS open lung bx.

## 2011-04-07 ENCOUNTER — Ambulatory Visit: Payer: Medicare Other | Admitting: Family Medicine

## 2011-04-14 ENCOUNTER — Other Ambulatory Visit: Payer: Self-pay

## 2011-04-14 ENCOUNTER — Encounter: Payer: Self-pay | Admitting: Thoracic Surgery (Cardiothoracic Vascular Surgery)

## 2011-04-14 ENCOUNTER — Institutional Professional Consult (permissible substitution) (INDEPENDENT_AMBULATORY_CARE_PROVIDER_SITE_OTHER): Payer: Medicare Other | Admitting: Thoracic Surgery (Cardiothoracic Vascular Surgery)

## 2011-04-14 VITALS — BP 140/68 | HR 82 | Resp 18 | Ht 67.5 in | Wt 202.0 lb

## 2011-04-14 DIAGNOSIS — R9389 Abnormal findings on diagnostic imaging of other specified body structures: Secondary | ICD-10-CM

## 2011-04-14 DIAGNOSIS — J189 Pneumonia, unspecified organism: Secondary | ICD-10-CM

## 2011-04-14 DIAGNOSIS — R918 Other nonspecific abnormal finding of lung field: Secondary | ICD-10-CM

## 2011-04-14 DIAGNOSIS — J8410 Pulmonary fibrosis, unspecified: Secondary | ICD-10-CM

## 2011-04-14 NOTE — Progress Notes (Signed)
PCP is [name], MD Referring Provider is Deneise Lever, MD  Chief Complaint  Patient presents with  . Pneumonia    bialteral and air-space disease.  Needs Vats bx for diagnosis    HPI: 78 year old woman who was in her usual state of health until October of this year. She says she initially presented with a sinus infection she was treated with antibiotics, her sinuses improved, but she continued to note a lot of mucus and clearing her throat. She also noted some shortness of breath. A chest x-ray suggested pneumonia she was once again treated with antibiotics. During this time she noted that she felt very weak and had a poor appetite. A CT of the chest on 03/24/2011 showed patchy air space disease involving both lungs, it was felt this likely represented pneumonia and possibly some intra-alveolar hemorrhage. She was sent to Dr. Keturah Barre and he felt this could represent Wegener's  granulomatosis or vasculitis. Her ESR was significantly elevated. She is now sent for consideration for open lung biopsy.  Past Medical History  Diagnosis Date  . HYPOTHYROIDISM 07/24/2008  . HYPERLIPIDEMIA 07/24/2008  . DEPRESSION 05/16/2009  . HYPERTENSION 07/24/2008  . Vertigo   . Arthritis   . Breast cancer   . CVA (cerebral vascular accident)   . Diabetes mellitus type II     Past Surgical History  Procedure Date  . Knee surgery 2009    TKR  . Cholecystectomy 1980  . Breast surgery 2007    Lumpectomy, XRT 2006    Family History  Problem Relation Age of Onset  . Arthritis Mother   . Rheum arthritis Mother   . Heart disease Father   . Coronary artery disease Father   . Cancer Sister     breast CA, both sisters  . Breast cancer Sister   . Coronary artery disease Brother   . Lung cancer Brother     Social History History  Substance Use Topics  . Smoking status: Former Smoker -- 0.5 packs/day for 12 years    Types: Cigarettes    Quit date: 06/04/1982  . Smokeless tobacco: Not on  file  . Alcohol Use: Not on file    Current Outpatient Prescriptions  Medication Sig Dispense Refill  . amLODipine-olmesartan (AZOR) 5-40 MG per tablet Take 1 tablet by mouth daily.  30 tablet  5  . aspirin 81 MG tablet Take 81 mg by mouth daily.        Marland Kitchen BYSTOLIC 10 MG tablet TAKE ONE TABLET BY MOUTH EVERY DAY  30 each  11  . calcium citrate (CALCITRATE) 950 MG tablet Take 1 tablet by mouth daily. Takes  1200 mg daily      . cyanocobalamin 100 MCG tablet Take 100 mcg by mouth daily.        . diazepam (VALIUM) 5 MG tablet TAKE ONE TABLET BY MOUTH EVERY DAY AS NEEDED  30 tablet  2  . ergocalciferol (VITAMIN D2) 50000 UNITS capsule Take 50,000 Units by mouth once a week.        . FeFum-FePo-FA-B Cmp-C-Zn-Mn-Cu (RE DUALVIT PLUS) 162-115.2-1 MG CAPS TAKE ONE CAPSULE BY MOUTH EVERY DAY  30 each  10  . fluticasone (FLONASE) 50 MCG/ACT nasal spray Place 2 sprays into the nose daily.  16 g  12  . glipiZIDE (GLUCOTROL) 10 MG tablet TAKE ONE TABLET BY MOUTH TWICE DAILY  180 tablet  3  . L-Methylfolate-B6-B12 (METANX) 3-35-2 MG TABS Take one tab by mouth daily  90 tablet  3  . levothyroxine (LEVOTHROID) 125 MCG tablet Take 1 tablet (125 mcg total) by mouth daily.  30 tablet  11  . metFORMIN (GLUCOPHAGE-XR) 500 MG 24 hr tablet Take 500 mg by mouth. Two tabs two times a day (must equal 1000 mg two times daily)       . Multiple Vitamins-Minerals (CENTRUM ULTRA WOMENS) TABS Take by mouth daily.        . predniSONE (DELTASONE) 10 MG tablet Take as directed  100 tablet  1  . pyridoxine (B-6) 200 MG tablet Take 200 mg by mouth daily.        Marland Kitchen scopolamine (TRANSDERM-SCOP) 1.5 MG Place 1 patch (1.5 mg total) onto the skin every 3 (three) days.  4 patch  3  . rosuvastatin (CRESTOR) 5 MG tablet Take 5 mg by mouth daily.          Allergies  Allergen Reactions  . Amoxicillin     GI upset  . Codeine Sulfate     REACTION: GI upset  . Sulfonamide Derivatives     REACTION: GI upset    Review of Systems    Constitutional: Positive for appetite change. Negative for unexpected weight change.  Eyes: Negative.   Respiratory: Positive for shortness of breath. Negative for wheezing and stridor.        Blood in sputum  Cardiovascular: Negative for chest pain, palpitations and leg swelling.       Heart murmur, at least since 2008  Gastrointestinal: Negative.   Genitourinary: Negative.   Musculoskeletal:       TKR in 2009 or 10  Neurological: Positive for dizziness.       TIA 2008 Uses a walker b/c vertigo and prior TKR  Hematological: Does not bruise/bleed easily.  All other systems reviewed and are negative.    BP 140/68  Pulse 82  Resp 18  Ht 5' 7.5" (1.715 m)  Wt 202 lb (91.627 kg)  BMI 31.17 kg/m2  SpO2 92% Physical Exam  Vitals reviewed. Constitutional: She is oriented to person, place, and time. She appears well-developed and well-nourished. No distress.       overweight  HENT:  Head: Normocephalic and atraumatic.  Eyes: Pupils are equal, round, and reactive to light.  Neck: Normal range of motion. Neck supple. No JVD present. No tracheal deviation present. No thyromegaly present.  Cardiovascular: Normal rate and regular rhythm.   Murmur (2/6 systolic murmur) heard. Pulmonary/Chest: Effort normal.       Coarse BS L>R  Abdominal: Soft. There is no tenderness.  Musculoskeletal: She exhibits no edema.  Lymphadenopathy:    She has no cervical adenopathy.  Neurological: She is alert and oriented to person, place, and time. No cranial nerve deficit.  Skin: Skin is warm and dry.  Psychiatric: She has a normal mood and affect.     Diagnostic Tests: CT of the chest from 03/24/2011 is reviewed, there is diffuse airspace disease left greater than right  Impression: 78 year old woman who presents with weakness, shortness of breath, poor appetite, and blood in sputum was found to have diffuse airspace disease in both lungs. She has not responded to treatment with antibiotics on  multiple occasions and her ESR is significantly elevated, raising the possibility of an inflammatory etiology such as a vasculitis. She needs a lung biopsy to establish a diagnosis, so that she can be appropriately treated.  I have discussed with the patient and her husband(who is a patient of mine) the general nature  of the  procedure, need for general anesthesia,and incisions to be used. They understand this is a diagnostic and not a therapeutic procedure. I have discussed with them the expected hospital stay, overall recovery and short and long term outcomes. They understand the risks include but are not limited to death, stroke, MI, DVT/PE, bleeding, possible need for transfusion, infections,other organ system dysfunction including respiratory, renal, or GI complications. She understands and accepts these risks and agrees to proceed. They also understand there is a small possibility the procedure will not be diagnostic.  Plan: Left vats, lung biopsy on Tuesday, January 15. She will be admitted on the day of surgery

## 2011-04-15 ENCOUNTER — Encounter (HOSPITAL_COMMUNITY): Payer: Self-pay

## 2011-04-17 NOTE — Pre-Procedure Instructions (Signed)
[facility]  04/17/2011   Your procedure is scheduled on:  JAN 15 Report to [facility] at [facility].  Call this number if you have problems the morning of surgery: [phone]   Remember:   Do not eat food:After Midnight.  May have clear liquids: up to 4 Hours before arrival.  Clear liquids include soda, tea, black coffee, apple or grape juice, broth.  Take these medicines the morning of surgery with A SIP OF WATER:                 BYSTOLIC,LEVOTHYROXINE,PREDNISONE,diazepam  Do not wear jewelry, make-up or nail polish.  Do not wear lotions, powders, or perfumes. You may wear deodorant.  Do not shave 48 hours prior to surgery.  Do not bring valuables to the hospital.  Contacts, dentures or bridgework may not be worn into surgery.  Leave suitcase in the car. After surgery it may be brought to your room.  For patients admitted to the hospital, checkout time is 11:00 AM the day of discharge.   Patients discharged the day of surgery will not be allowed to drive home.  Name and phone number of your driver:FAMILY Special Instructions: CHG Shower Use Special Wash: 1/2 bottle night before surgery and 1/2 bottle morning of surgery.   Please read over the following fact sheets that you were given: Pain Booklet, Coughing and Deep Breathing and Blood Transfusion Information

## 2011-04-18 ENCOUNTER — Other Ambulatory Visit: Payer: Self-pay

## 2011-04-18 ENCOUNTER — Encounter (HOSPITAL_COMMUNITY)
Admission: RE | Admit: 2011-04-18 | Discharge: 2011-04-18 | Disposition: A | Discharge status: Home / Self Care | Payer: Medicare Other | Source: Ambulatory Visit | Attending: Thoracic Surgery (Cardiothoracic Vascular Surgery) | Admitting: Thoracic Surgery (Cardiothoracic Vascular Surgery)

## 2011-04-18 ENCOUNTER — Ambulatory Visit (HOSPITAL_COMMUNITY)
Admission: RE | Admit: 2011-04-18 | Discharge: 2011-04-18 | Disposition: A | Discharge status: Home / Self Care | Payer: Medicare Other | Source: Ambulatory Visit | Attending: Thoracic Surgery (Cardiothoracic Vascular Surgery) | Admitting: Thoracic Surgery (Cardiothoracic Vascular Surgery)

## 2011-04-18 ENCOUNTER — Encounter (HOSPITAL_COMMUNITY): Payer: Self-pay

## 2011-04-18 DIAGNOSIS — J8410 Pulmonary fibrosis, unspecified: Secondary | ICD-10-CM | POA: Insufficient documentation

## 2011-04-18 DIAGNOSIS — E119 Type 2 diabetes mellitus without complications: Secondary | ICD-10-CM | POA: Insufficient documentation

## 2011-04-18 DIAGNOSIS — I517 Cardiomegaly: Secondary | ICD-10-CM | POA: Diagnosis not present

## 2011-04-18 DIAGNOSIS — Z01818 Encounter for other preprocedural examination: Secondary | ICD-10-CM | POA: Diagnosis not present

## 2011-04-18 DIAGNOSIS — I10 Essential (primary) hypertension: Secondary | ICD-10-CM | POA: Diagnosis not present

## 2011-04-18 DIAGNOSIS — J984 Other disorders of lung: Secondary | ICD-10-CM | POA: Diagnosis not present

## 2011-04-18 DIAGNOSIS — Z01810 Encounter for preprocedural cardiovascular examination: Secondary | ICD-10-CM | POA: Insufficient documentation

## 2011-04-18 DIAGNOSIS — Z01811 Encounter for preprocedural respiratory examination: Secondary | ICD-10-CM | POA: Diagnosis not present

## 2011-04-18 HISTORY — DX: Disorder of the skin and subcutaneous tissue, unspecified: L98.9

## 2011-04-18 HISTORY — DX: Dizziness and giddiness: R42

## 2011-04-18 HISTORY — DX: Cardiac murmur, unspecified: R01.1

## 2011-04-18 LAB — CBC
Hemoglobin: 10.7 g/dL — ABNORMAL LOW (ref 12.0–15.0)
MCH: 29.4 pg (ref 26.0–34.0)
RBC: 3.64 MIL/uL — ABNORMAL LOW (ref 3.87–5.11)
WBC: 10.1 10*3/uL (ref 4.0–10.5)

## 2011-04-18 LAB — URINALYSIS, ROUTINE W REFLEX MICROSCOPIC
Glucose, UA: NEGATIVE mg/dL
Specific Gravity, Urine: 1.007 (ref 1.005–1.030)
pH: 7 (ref 5.0–8.0)

## 2011-04-18 LAB — BLOOD GAS, ARTERIAL
Acid-base deficit: 1.1 mmol/L (ref 0.0–2.0)
Bicarbonate: 22.5 mEq/L (ref 20.0–24.0)
FIO2: 0.21 %
O2 Saturation: 98.2 %
pCO2 arterial: 34.2 mmHg — ABNORMAL LOW (ref 35.0–45.0)
pO2, Arterial: 93.7 mmHg (ref 80.0–100.0)

## 2011-04-18 LAB — URINE MICROSCOPIC-ADD ON

## 2011-04-18 LAB — TYPE AND SCREEN: ABO/RH(D): A POS

## 2011-04-18 LAB — COMPREHENSIVE METABOLIC PANEL
ALT: 14 U/L (ref 0–35)
AST: 21 U/L (ref 0–37)
Alkaline Phosphatase: 60 U/L (ref 39–117)
CO2: 22 mEq/L (ref 19–32)
Calcium: 9.6 mg/dL (ref 8.4–10.5)
Chloride: 91 mEq/L — ABNORMAL LOW (ref 96–112)
GFR calc Af Amer: 69 mL/min — ABNORMAL LOW (ref >90)
GFR calc non Af Amer: 60 mL/min — ABNORMAL LOW (ref >90)
Glucose, Bld: 174 mg/dL — ABNORMAL HIGH (ref 70–99)
Sodium: 126 mEq/L — ABNORMAL LOW (ref 135–145)
Total Bilirubin: 0.4 mg/dL (ref 0.3–1.2)

## 2011-04-18 LAB — SURGICAL PCR SCREEN
MRSA, PCR: NEGATIVE
Staphylococcus aureus: NEGATIVE

## 2011-04-21 ENCOUNTER — Ambulatory Visit: Payer: Medicare Other | Admitting: Family Medicine

## 2011-04-21 MED ORDER — VANCOMYCIN HCL IN DEXTROSE 1-5 GM/200ML-% IV SOLN
1000.0000 mg | INTRAVENOUS | Status: AC
  Administered 2011-04-22: 1000 mg via INTRAVENOUS

## 2011-04-21 NOTE — Consult Note (Signed)
Anesthesia:  Patient is a 78 year old female scheduled for a left VATS, lung biopsy on 02/20/12.  Her history includes hypothyroidism, HLD, depression, HTN, breast CA, DM2, CVA, intermittent vertigo, murmur, recent PNA (mid December 2012) and former smoker.  She was also hospitalized in 03/2010 for nausea and dizziness felt related to hyponatremia at 121.  Her Pulmonologist is Dr. Annamaria Boots.  Her PCP is Dr. Elease Hashimoto.  Preoperative EKG shows NSR with sinus arrhythmia.  Her last echo was in 2008 and showed: - Overall left ventricular systolic function was normal. Left ventricular ejection fraction was estimated , range being 60 % to 65 %. There was no diagnostic evidence of left ventricular regional wall motion abnormalities. Left ventricular wall thickness was mildly increased. - The aortic valve was mildly calcified. - Trivial TR.  CXR on 04/18/11 showed: Interval clearing of multi focal alveolar infiltrate since the  previous exam. Chronic change in the left mid and lower lung zones likely represents scarring. No new worrisome or acute abnormality seen. Stable cardiomegaly  Preoperative labs were significant for a low sodium and chloride of 126 and 91, respectively.  Glucose was elevated at 174.  H/H were low at 10.7/31.9.  T & S done already.  Urine also shows moderate leukocytes, but negative nitrites. I faxed and left a voice mail with Marlana Latus, RN at [facility] regarding having Dr. Roxan Hockey review labs if not done so already.  I am ordering a repeat BMET for the day of surgery.

## 2011-04-22 ENCOUNTER — Inpatient Hospital Stay (HOSPITAL_COMMUNITY)
Admission: RE | Admit: 2011-04-22 | Discharge: 2011-04-26 | DRG: 166 | Disposition: A | Discharge status: Home / Self Care | Payer: Medicare Other | Source: Ambulatory Visit | Attending: Thoracic Surgery (Cardiothoracic Vascular Surgery) | Admitting: Thoracic Surgery (Cardiothoracic Vascular Surgery)

## 2011-04-22 ENCOUNTER — Other Ambulatory Visit: Payer: Self-pay | Admitting: Thoracic Surgery (Cardiothoracic Vascular Surgery)

## 2011-04-22 ENCOUNTER — Encounter (HOSPITAL_COMMUNITY)
Admission: RE | Disposition: A | Discharge status: Home / Self Care | Payer: Self-pay | Source: Ambulatory Visit | Attending: Thoracic Surgery (Cardiothoracic Vascular Surgery)

## 2011-04-22 ENCOUNTER — Ambulatory Visit (HOSPITAL_COMMUNITY): Payer: Medicare Other

## 2011-04-22 ENCOUNTER — Encounter (HOSPITAL_COMMUNITY): Payer: Self-pay | Admitting: Vascular Surgery

## 2011-04-22 ENCOUNTER — Ambulatory Visit (HOSPITAL_COMMUNITY): Payer: Medicare Other | Admitting: Vascular Surgery

## 2011-04-22 DIAGNOSIS — Z96659 Presence of unspecified artificial knee joint: Secondary | ICD-10-CM

## 2011-04-22 DIAGNOSIS — IMO0002 Reserved for concepts with insufficient information to code with codable children: Secondary | ICD-10-CM

## 2011-04-22 DIAGNOSIS — Z8673 Personal history of transient ischemic attack (TIA), and cerebral infarction without residual deficits: Secondary | ICD-10-CM

## 2011-04-22 DIAGNOSIS — Z79899 Other long term (current) drug therapy: Secondary | ICD-10-CM | POA: Diagnosis not present

## 2011-04-22 DIAGNOSIS — Z8249 Family history of ischemic heart disease and other diseases of the circulatory system: Secondary | ICD-10-CM

## 2011-04-22 DIAGNOSIS — Z4682 Encounter for fitting and adjustment of non-vascular catheter: Secondary | ICD-10-CM | POA: Diagnosis not present

## 2011-04-22 DIAGNOSIS — Z8261 Family history of arthritis: Secondary | ICD-10-CM

## 2011-04-22 DIAGNOSIS — J9819 Other pulmonary collapse: Secondary | ICD-10-CM | POA: Diagnosis not present

## 2011-04-22 DIAGNOSIS — E119 Type 2 diabetes mellitus without complications: Secondary | ICD-10-CM | POA: Diagnosis present

## 2011-04-22 DIAGNOSIS — E039 Hypothyroidism, unspecified: Secondary | ICD-10-CM | POA: Diagnosis present

## 2011-04-22 DIAGNOSIS — F329 Major depressive disorder, single episode, unspecified: Secondary | ICD-10-CM | POA: Diagnosis present

## 2011-04-22 DIAGNOSIS — Z853 Personal history of malignant neoplasm of breast: Secondary | ICD-10-CM

## 2011-04-22 DIAGNOSIS — I10 Essential (primary) hypertension: Secondary | ICD-10-CM | POA: Diagnosis not present

## 2011-04-22 DIAGNOSIS — J8410 Pulmonary fibrosis, unspecified: Secondary | ICD-10-CM | POA: Diagnosis not present

## 2011-04-22 DIAGNOSIS — C50919 Malignant neoplasm of unspecified site of unspecified female breast: Secondary | ICD-10-CM | POA: Diagnosis not present

## 2011-04-22 DIAGNOSIS — Z801 Family history of malignant neoplasm of trachea, bronchus and lung: Secondary | ICD-10-CM | POA: Diagnosis not present

## 2011-04-22 DIAGNOSIS — M353 Polymyalgia rheumatica: Secondary | ICD-10-CM | POA: Diagnosis present

## 2011-04-22 DIAGNOSIS — R918 Other nonspecific abnormal finding of lung field: Secondary | ICD-10-CM | POA: Diagnosis not present

## 2011-04-22 DIAGNOSIS — E871 Hypo-osmolality and hyponatremia: Secondary | ICD-10-CM | POA: Diagnosis present

## 2011-04-22 DIAGNOSIS — IMO0001 Reserved for inherently not codable concepts without codable children: Secondary | ICD-10-CM | POA: Diagnosis not present

## 2011-04-22 DIAGNOSIS — Z7982 Long term (current) use of aspirin: Secondary | ICD-10-CM | POA: Diagnosis not present

## 2011-04-22 DIAGNOSIS — Z803 Family history of malignant neoplasm of breast: Secondary | ICD-10-CM

## 2011-04-22 DIAGNOSIS — Z87891 Personal history of nicotine dependence: Secondary | ICD-10-CM | POA: Diagnosis not present

## 2011-04-22 DIAGNOSIS — F3289 Other specified depressive episodes: Secondary | ICD-10-CM | POA: Diagnosis present

## 2011-04-22 DIAGNOSIS — N390 Urinary tract infection, site not specified: Secondary | ICD-10-CM | POA: Diagnosis present

## 2011-04-22 DIAGNOSIS — J189 Pneumonia, unspecified organism: Secondary | ICD-10-CM | POA: Diagnosis present

## 2011-04-22 DIAGNOSIS — E785 Hyperlipidemia, unspecified: Secondary | ICD-10-CM | POA: Diagnosis present

## 2011-04-22 DIAGNOSIS — Z09 Encounter for follow-up examination after completed treatment for conditions other than malignant neoplasm: Secondary | ICD-10-CM | POA: Diagnosis not present

## 2011-04-22 HISTORY — PX: VIDEO ASSISTED THORACOSCOPY: SHX5073

## 2011-04-22 LAB — BASIC METABOLIC PANEL
BUN: 14 mg/dL (ref 6–23)
CO2: 25 mEq/L (ref 19–32)
Calcium: 9.1 mg/dL (ref 8.4–10.5)
GFR calc non Af Amer: 62 mL/min — ABNORMAL LOW (ref >90)
Glucose, Bld: 176 mg/dL — ABNORMAL HIGH (ref 70–99)
Sodium: 130 mEq/L — ABNORMAL LOW (ref 135–145)

## 2011-04-22 LAB — TISSUE CULTURE
Culture: NO GROWTH
Gram Stain: NONE SEEN

## 2011-04-22 LAB — GLUCOSE, CAPILLARY: Glucose-Capillary: 284 mg/dL — ABNORMAL HIGH (ref 70–99)

## 2011-04-22 SURGERY — VIDEO ASSISTED THORACOSCOPY
Anesthesia: General | Site: Chest | Laterality: Left | Wound class: Clean Contaminated

## 2011-04-22 MED ORDER — NALOXONE HCL 0.4 MG/ML IJ SOLN: 0.4000 mg | INTRAMUSCULAR | Status: DC | PRN

## 2011-04-22 MED ORDER — AMLODIPINE BESYLATE 5 MG PO TABS
5.0000 mg | ORAL_TABLET | Freq: Every day | ORAL | Status: DC
  Administered 2011-04-22 – 2011-04-26 (×5): 5 mg via ORAL
  Filled 2011-04-22 (×5): qty 1

## 2011-04-22 MED ORDER — DIPHENHYDRAMINE HCL 12.5 MG/5ML PO ELIX
12.5000 mg | ORAL_SOLUTION | Freq: Four times a day (QID) | ORAL | Status: DC | PRN
  Filled 2011-04-22: qty 5

## 2011-04-22 MED ORDER — MENTHOL 3 MG MT LOZG
1.0000 | LOZENGE | OROMUCOSAL | Status: DC | PRN
  Filled 2011-04-22: qty 9

## 2011-04-22 MED ORDER — FENTANYL 10 MCG/ML IV SOLN: INTRAVENOUS | Status: DC

## 2011-04-22 MED ORDER — LACTATED RINGERS IV SOLN
INTRAVENOUS | Status: DC | PRN
  Administered 2011-04-22: 07:00:00 via INTRAVENOUS

## 2011-04-22 MED ORDER — OXYCODONE-ACETAMINOPHEN 5-325 MG PO TABS: 1.0000 | ORAL_TABLET | ORAL | Status: DC | PRN

## 2011-04-22 MED ORDER — METOPROLOL TARTRATE 1 MG/ML IV SOLN
2.5000 mg | INTRAVENOUS | Status: DC | PRN
  Administered 2011-04-22: 2.5 mg via INTRAVENOUS

## 2011-04-22 MED ORDER — SODIUM CHLORIDE 0.9 % IJ SOLN: 9.0000 mL | INTRAMUSCULAR | Status: DC | PRN

## 2011-04-22 MED ORDER — OXYCODONE HCL 5 MG PO TABS: 5.0000 mg | ORAL_TABLET | ORAL | Status: AC | PRN

## 2011-04-22 MED ORDER — MIDAZOLAM HCL 5 MG/5ML IJ SOLN
INTRAMUSCULAR | Status: DC | PRN
  Administered 2011-04-22 (×2): 1 mg via INTRAVENOUS

## 2011-04-22 MED ORDER — GLYCOPYRROLATE 0.2 MG/ML IJ SOLN
INTRAMUSCULAR | Status: DC | PRN
  Administered 2011-04-22: .6 mg via INTRAVENOUS

## 2011-04-22 MED ORDER — BISACODYL 5 MG PO TBEC
10.0000 mg | DELAYED_RELEASE_TABLET | Freq: Every day | ORAL | Status: DC
  Administered 2011-04-23 – 2011-04-25 (×3): 10 mg via ORAL
  Filled 2011-04-22 (×3): qty 2

## 2011-04-22 MED ORDER — VITAMIN D3 25 MCG (1000 UNIT) PO TABS
3000.0000 [IU] | ORAL_TABLET | Freq: Every day | ORAL | Status: DC
  Administered 2011-04-23 – 2011-04-26 (×4): 3000 [IU] via ORAL
  Filled 2011-04-22 (×5): qty 3

## 2011-04-22 MED ORDER — AMLODIPINE-OLMESARTAN 5-40 MG PO TABS: 1.0000 | ORAL_TABLET | Freq: Every day | ORAL | Status: DC

## 2011-04-22 MED ORDER — ROSUVASTATIN CALCIUM 5 MG PO TABS
5.0000 mg | ORAL_TABLET | Freq: Every day | ORAL | Status: DC
  Administered 2011-04-22 – 2011-04-26 (×5): 5 mg via ORAL
  Filled 2011-04-22 (×5): qty 1

## 2011-04-22 MED ORDER — KCL IN DEXTROSE-NACL 20-5-0.45 MEQ/L-%-% IV SOLN
INTRAVENOUS | Status: AC
  Administered 2011-04-22: 100 mL/h via INTRAVENOUS
  Filled 2011-04-22: qty 1000

## 2011-04-22 MED ORDER — ONDANSETRON HCL 4 MG/2ML IJ SOLN
4.0000 mg | Freq: Four times a day (QID) | INTRAMUSCULAR | Status: AC | PRN
  Administered 2011-04-22: 4 mg via INTRAVENOUS

## 2011-04-22 MED ORDER — SENNOSIDES-DOCUSATE SODIUM 8.6-50 MG PO TABS
1.0000 | ORAL_TABLET | Freq: Every evening | ORAL | Status: DC | PRN
  Filled 2011-04-22: qty 1

## 2011-04-22 MED ORDER — VITAMIN B-6 100 MG PO TABS
100.0000 mg | ORAL_TABLET | Freq: Every day | ORAL | Status: DC
  Administered 2011-04-23 – 2011-04-26 (×4): 100 mg via ORAL
  Filled 2011-04-22 (×5): qty 1

## 2011-04-22 MED ORDER — FENTANYL 10 MCG/ML IV SOLN
INTRAVENOUS | Status: DC
  Administered 2011-04-22: 20 ug via INTRAVENOUS
  Administered 2011-04-22: 16:00:00 via INTRAVENOUS
  Administered 2011-04-23 (×3): 10 ug via INTRAVENOUS
  Administered 2011-04-23: 30 ug/h via INTRAVENOUS
  Administered 2011-04-24: 0 ug via INTRAVENOUS
  Administered 2011-04-24: 20 ug via INTRAVENOUS
  Administered 2011-04-24 – 2011-04-25 (×2): 10 ug via INTRAVENOUS
  Filled 2011-04-22: qty 50

## 2011-04-22 MED ORDER — PREDNISONE 5 MG PO TABS
5.0000 mg | ORAL_TABLET | ORAL | Status: DC
  Administered 2011-04-23 – 2011-04-26 (×4): 5 mg via ORAL
  Filled 2011-04-22 (×6): qty 1

## 2011-04-22 MED ORDER — LEVOTHYROXINE SODIUM 125 MCG PO TABS
125.0000 ug | ORAL_TABLET | Freq: Every day | ORAL | Status: DC
  Administered 2011-04-23 – 2011-04-26 (×4): 125 ug via ORAL
  Filled 2011-04-22 (×5): qty 1

## 2011-04-22 MED ORDER — ONDANSETRON HCL 4 MG/2ML IJ SOLN: 4.0000 mg | Freq: Four times a day (QID) | INTRAMUSCULAR | Status: DC | PRN

## 2011-04-22 MED ORDER — METFORMIN HCL ER 500 MG PO TB24
1000.0000 mg | ORAL_TABLET | Freq: Two times a day (BID) | ORAL | Status: DC
  Administered 2011-04-22 – 2011-04-26 (×7): 1000 mg via ORAL
  Filled 2011-04-22 (×10): qty 2

## 2011-04-22 MED ORDER — POTASSIUM CHLORIDE 10 MEQ/50ML IV SOLN: 10.0000 meq | Freq: Every day | INTRAVENOUS | Status: DC | PRN

## 2011-04-22 MED ORDER — VANCOMYCIN HCL IN DEXTROSE 1-5 GM/200ML-% IV SOLN
1000.0000 mg | Freq: Two times a day (BID) | INTRAVENOUS | Status: AC
  Administered 2011-04-22: 1000 mg via INTRAVENOUS
  Filled 2011-04-22: qty 200

## 2011-04-22 MED ORDER — INSULIN ASPART 100 UNIT/ML ~~LOC~~ SOLN: 0.0000 [IU] | SUBCUTANEOUS | Status: DC

## 2011-04-22 MED ORDER — DIPHENHYDRAMINE HCL 50 MG/ML IJ SOLN: 12.5000 mg | Freq: Four times a day (QID) | INTRAMUSCULAR | Status: DC | PRN

## 2011-04-22 MED ORDER — ASPIRIN EC 81 MG PO TBEC
81.0000 mg | DELAYED_RELEASE_TABLET | Freq: Every day | ORAL | Status: DC
  Administered 2011-04-22 – 2011-04-26 (×5): 81 mg via ORAL
  Filled 2011-04-22 (×5): qty 1

## 2011-04-22 MED ORDER — ONDANSETRON HCL 4 MG/2ML IJ SOLN
INTRAMUSCULAR | Status: DC | PRN
  Administered 2011-04-22: 4 mg via INTRAVENOUS

## 2011-04-22 MED ORDER — VECURONIUM BROMIDE 10 MG IV SOLR
INTRAVENOUS | Status: DC | PRN
  Administered 2011-04-22: 3 mg via INTRAVENOUS

## 2011-04-22 MED ORDER — PROPOFOL 10 MG/ML IV EMUL
INTRAVENOUS | Status: DC | PRN
  Administered 2011-04-22: 200 mg via INTRAVENOUS

## 2011-04-22 MED ORDER — HYDROMORPHONE HCL PF 1 MG/ML IJ SOLN
0.2500 mg | INTRAMUSCULAR | Status: DC | PRN
  Administered 2011-04-22: 0.25 mg via INTRAVENOUS

## 2011-04-22 MED ORDER — INSULIN ASPART 100 UNIT/ML ~~LOC~~ SOLN
0.0000 [IU] | Freq: Four times a day (QID) | SUBCUTANEOUS | Status: DC
  Administered 2011-04-22: 16 [IU] via SUBCUTANEOUS
  Administered 2011-04-22: 8 [IU] via SUBCUTANEOUS
  Administered 2011-04-23 (×2): 4 [IU] via SUBCUTANEOUS
  Filled 2011-04-22: qty 3

## 2011-04-22 MED ORDER — TRAMADOL HCL 50 MG PO TABS: 50.0000 mg | ORAL_TABLET | Freq: Four times a day (QID) | ORAL | Status: DC | PRN

## 2011-04-22 MED ORDER — GLIPIZIDE 10 MG PO TABS
10.0000 mg | ORAL_TABLET | Freq: Two times a day (BID) | ORAL | Status: DC
  Administered 2011-04-23 – 2011-04-26 (×6): 10 mg via ORAL
  Filled 2011-04-22 (×10): qty 1

## 2011-04-22 MED ORDER — NEBIVOLOL HCL 10 MG PO TABS
10.0000 mg | ORAL_TABLET | Freq: Every day | ORAL | Status: DC
  Administered 2011-04-23 – 2011-04-26 (×4): 10 mg via ORAL
  Filled 2011-04-22 (×5): qty 1

## 2011-04-22 MED ORDER — ONDANSETRON HCL 4 MG/2ML IJ SOLN
INTRAMUSCULAR | Status: AC
  Filled 2011-04-22: qty 2

## 2011-04-22 MED ORDER — ALBUTEROL SULFATE HFA 108 (90 BASE) MCG/ACT IN AERS
4.0000 | INHALATION_SPRAY | Freq: Four times a day (QID) | RESPIRATORY_TRACT | Status: AC
  Administered 2011-04-22 – 2011-04-24 (×8): 4 via RESPIRATORY_TRACT
  Filled 2011-04-22: qty 6.7

## 2011-04-22 MED ORDER — CALCIUM CITRATE 950 (200 CA) MG PO TABS
950.0000 mg | ORAL_TABLET | Freq: Every day | ORAL | Status: DC
  Administered 2011-04-23 – 2011-04-26 (×4): 950 mg via ORAL
  Filled 2011-04-22 (×5): qty 1

## 2011-04-22 MED ORDER — FENTANYL CITRATE 0.05 MG/ML IJ SOLN
INTRAMUSCULAR | Status: DC | PRN
  Administered 2011-04-22: 100 ug via INTRAVENOUS
  Administered 2011-04-22 (×5): 25 ug via INTRAVENOUS
  Administered 2011-04-22: 50 ug via INTRAVENOUS
  Administered 2011-04-22 (×2): 25 ug via INTRAVENOUS
  Administered 2011-04-22: 50 ug via INTRAVENOUS
  Administered 2011-04-22 (×3): 25 ug via INTRAVENOUS
  Administered 2011-04-22: 50 ug via INTRAVENOUS

## 2011-04-22 MED ORDER — ACETAMINOPHEN 10 MG/ML IV SOLN
1000.0000 mg | Freq: Four times a day (QID) | INTRAVENOUS | Status: AC
  Administered 2011-04-22 – 2011-04-23 (×3): 1000 mg via INTRAVENOUS
  Filled 2011-04-22 (×3): qty 100

## 2011-04-22 MED ORDER — NEOSTIGMINE METHYLSULFATE 1 MG/ML IJ SOLN
INTRAMUSCULAR | Status: DC | PRN
  Administered 2011-04-22: 4 mg via INTRAVENOUS

## 2011-04-22 MED ORDER — ACETAMINOPHEN 10 MG/ML IV SOLN
INTRAVENOUS | Status: AC
  Filled 2011-04-22: qty 100

## 2011-04-22 MED ORDER — METFORMIN HCL ER 500 MG PO TB24
1000.0000 mg | ORAL_TABLET | Freq: Two times a day (BID) | ORAL | Status: DC
  Filled 2011-04-22: qty 2

## 2011-04-22 MED ORDER — INSULIN ASPART 100 UNIT/ML ~~LOC~~ SOLN
SUBCUTANEOUS | Status: AC
  Filled 2011-04-22: qty 1

## 2011-04-22 MED ORDER — KCL IN DEXTROSE-NACL 20-5-0.45 MEQ/L-%-% IV SOLN
INTRAVENOUS | Status: DC
  Administered 2011-04-22: 100 mL/h via INTRAVENOUS
  Administered 2011-04-22: 23:00:00 via INTRAVENOUS
  Filled 2011-04-22 (×4): qty 1000

## 2011-04-22 MED ORDER — FLUTICASONE PROPIONATE 50 MCG/ACT NA SUSP
2.0000 | Freq: Every day | NASAL | Status: DC | PRN
  Filled 2011-04-22: qty 16

## 2011-04-22 MED ORDER — ROCURONIUM BROMIDE 100 MG/10ML IV SOLN
INTRAVENOUS | Status: DC | PRN
  Administered 2011-04-22: 50 mg via INTRAVENOUS

## 2011-04-22 MED ORDER — HYDROMORPHONE HCL PF 1 MG/ML IJ SOLN
INTRAMUSCULAR | Status: AC
  Filled 2011-04-22: qty 1

## 2011-04-22 MED ORDER — ONDANSETRON HCL 4 MG/2ML IJ SOLN
4.0000 mg | Freq: Four times a day (QID) | INTRAMUSCULAR | Status: DC | PRN
  Administered 2011-04-23: 4 mg via INTRAVENOUS
  Filled 2011-04-22: qty 2

## 2011-04-22 MED ORDER — DIPHENHYDRAMINE HCL 12.5 MG/5ML PO ELIX: 12.5000 mg | ORAL_SOLUTION | Freq: Four times a day (QID) | ORAL | Status: DC | PRN

## 2011-04-22 MED ORDER — OLMESARTAN MEDOXOMIL 40 MG PO TABS
40.0000 mg | ORAL_TABLET | Freq: Every day | ORAL | Status: DC
  Administered 2011-04-22 – 2011-04-26 (×5): 40 mg via ORAL
  Filled 2011-04-22 (×5): qty 1

## 2011-04-22 SURGICAL SUPPLY — 70 items
APPLIER CLIP ROT 10 11.4 M/L (STAPLE)
APR CLP MED LRG 11.4X10 (STAPLE)
CANISTER SUCTION 2500CC (MISCELLANEOUS) ×4 IMPLANT
CATH KIT ON Q 5IN SLV (PAIN MANAGEMENT) IMPLANT
CATH THORACIC 28FR (CATHETERS) IMPLANT
CATH THORACIC 28FR RT ANG (CATHETERS) ×2 IMPLANT
CATH THORACIC 36FR (CATHETERS) IMPLANT
CATH THORACIC 36FR RT ANG (CATHETERS) IMPLANT
CLIP APPLIE ROT 10 11.4 M/L (STAPLE) IMPLANT
CLIP TI MEDIUM 6 (CLIP) ×2 IMPLANT
CLOTH BEACON ORANGE TIMEOUT ST (SAFETY) ×2 IMPLANT
CONN Y 3/8X3/8X3/8  BEN (MISCELLANEOUS) ×1
CONN Y 3/8X3/8X3/8 BEN (MISCELLANEOUS) ×1 IMPLANT
CONT SPEC 4OZ CLIKSEAL STRL BL (MISCELLANEOUS) ×5 IMPLANT
DRAPE LAPAROSCOPIC ABDOMINAL (DRAPES) ×2 IMPLANT
DRAPE SLUSH MACHINE 52X66 (DRAPES) IMPLANT
DRAPE SLUSH/WARMER DISC (DRAPES) IMPLANT
ELECT REM PT RETURN 9FT ADLT (ELECTROSURGICAL) ×2
ELECTRODE REM PT RTRN 9FT ADLT (ELECTROSURGICAL) ×1 IMPLANT
GLOVE BIO SURGEON STRL SZ 6 (GLOVE) ×2 IMPLANT
GLOVE BIO SURGEON STRL SZ 6.5 (GLOVE) ×2 IMPLANT
GLOVE EUDERMIC 7 POWDERFREE (GLOVE) ×4 IMPLANT
GOWN PREVENTION PLUS XLARGE (GOWN DISPOSABLE) ×2 IMPLANT
GOWN STRL NON-REIN LRG LVL3 (GOWN DISPOSABLE) ×5 IMPLANT
HANDLE STAPLE ENDO GIA SHORT (STAPLE) ×2
HEMOSTAT SURGICEL 2X14 (HEMOSTASIS) IMPLANT
KIT BASIN OR (CUSTOM PROCEDURE TRAY) ×2 IMPLANT
KIT ROOM TURNOVER OR (KITS) ×2 IMPLANT
KIT SUCTION CATH 14FR (SUCTIONS) ×2 IMPLANT
NS IRRIG 1000ML POUR BTL (IV SOLUTION) ×4 IMPLANT
PACK CHEST (CUSTOM PROCEDURE TRAY) ×2 IMPLANT
PAD ARMBOARD 7.5X6 YLW CONV (MISCELLANEOUS) ×4 IMPLANT
RELOAD EGIA 45 MED/THCK PURPLE (STAPLE) ×4 IMPLANT
RELOAD EGIA 60 MED/THCK PURPLE (STAPLE) ×2 IMPLANT
RELOAD STAPLE 60 MED/THCK ART (STAPLE) IMPLANT
SEALANT PROGEL (MISCELLANEOUS) IMPLANT
SEALANT SURG COSEAL 4ML (VASCULAR PRODUCTS) IMPLANT
SEALANT SURG COSEAL 8ML (VASCULAR PRODUCTS) IMPLANT
SOLUTION ANTI FOG 6CC (MISCELLANEOUS) ×2 IMPLANT
SPECIMEN JAR MEDIUM (MISCELLANEOUS) ×2 IMPLANT
SPONGE GAUZE 4X4 12PLY (GAUZE/BANDAGES/DRESSINGS) ×2 IMPLANT
SPONGE GAUZE 4X4 FOR O.R. (GAUZE/BANDAGES/DRESSINGS) ×1 IMPLANT
SPONGE INTESTINAL PEANUT (DISPOSABLE) ×1 IMPLANT
STAPLER ENDO GIA 12 SHRT THIN (STAPLE) IMPLANT
STAPLER ENDO GIA 12MM SHORT (STAPLE) ×2 IMPLANT
SUT PROLENE 4 0 RB 1 (SUTURE) ×2
SUT PROLENE 4-0 RB1 .5 CRCL 36 (SUTURE) IMPLANT
SUT SILK  1 MH (SUTURE) ×2
SUT SILK 1 MH (SUTURE) ×2 IMPLANT
SUT SILK 2 0SH CR/8 30 (SUTURE) IMPLANT
SUT SILK 3 0SH CR/8 30 (SUTURE) IMPLANT
SUT VIC AB 1 CTX 36 (SUTURE)
SUT VIC AB 1 CTX36XBRD ANBCTR (SUTURE) IMPLANT
SUT VIC AB 2-0 CTX 36 (SUTURE) IMPLANT
SUT VIC AB 2-0 UR6 27 (SUTURE) IMPLANT
SUT VIC AB 3-0 X1 27 (SUTURE) ×4 IMPLANT
SUT VICRYL 2 TP 1 (SUTURE) IMPLANT
SUT VICRYL 3 0 8 18 UR 6 D8304 (SUTURE) ×2 IMPLANT
SWAB COLLECTION DEVICE MRSA (MISCELLANEOUS) IMPLANT
SYSTEM SAHARA CHEST DRAIN ATS (WOUND CARE) ×2 IMPLANT
TAPE CLOTH 4X10 WHT NS (GAUZE/BANDAGES/DRESSINGS) ×2 IMPLANT
TAPE CLOTH SURG 4X10 WHT LF (GAUZE/BANDAGES/DRESSINGS) ×1 IMPLANT
TIP APPLICATOR SPRAY EXTEND 16 (VASCULAR PRODUCTS) IMPLANT
TOWEL OR 17X24 6PK STRL BLUE (TOWEL DISPOSABLE) ×3 IMPLANT
TOWEL OR 17X26 10 PK STRL BLUE (TOWEL DISPOSABLE) ×3 IMPLANT
TRAP SPECIMEN MUCOUS 40CC (MISCELLANEOUS) ×1 IMPLANT
TRAY FOLEY CATH 14FRSI W/METER (CATHETERS) ×2 IMPLANT
TUBE ANAEROBIC SPECIMEN COL (MISCELLANEOUS) IMPLANT
TUNNELER SHEATH ON-Q 11GX8 (MISCELLANEOUS) ×2 IMPLANT
WATER STERILE IRR 1000ML POUR (IV SOLUTION) ×4 IMPLANT

## 2011-04-22 NOTE — Anesthesia Postprocedure Evaluation (Signed)
Anesthesia Post Note  Patient: [name] [name]  Procedure(s) Performed:  VIDEO ASSISTED THORACOSCOPY - WITH BIOPSY  Anesthesia type: General  Patient location: PACU  Post pain: Pain level controlled and Adequate analgesia  Post assessment: Post-op Vital signs reviewed, Patient's Cardiovascular Status Stable, Respiratory Function Stable, Patent Airway and Pain level controlled  Last Vitals:  Filed Vitals:   04/22/11 1100  BP:   Pulse: 78  Temp:   Resp: 24    Post vital signs: Reviewed and stable  Level of consciousness: awake, alert  and oriented  Complications: No apparent anesthesia complications

## 2011-04-22 NOTE — Transfer of Care (Signed)
Immediate Anesthesia Transfer of Care Note  Patient: [name] [name]  Procedure(s) Performed:  VIDEO ASSISTED THORACOSCOPY - WITH BIOPSY  Patient Location: PACU  Anesthesia Type: General  Level of Consciousness: awake, oriented and patient cooperative  Airway & Oxygen Therapy: Patient Spontanous Breathing and Patient connected to face mask oxygen  Post-op Assessment: Report given to PACU RN, Post -op Vital signs reviewed and stable and Patient moving all extremities X 4  Post vital signs: Reviewed Filed Vitals:   04/22/11 0619  BP: 174/76  Pulse: 72  Temp: 36.4 C  Resp: 18    Complications: No apparent anesthesia complications

## 2011-04-22 NOTE — Preoperative (Signed)
Beta Blockers   Reason not to administer Beta Blockers:Not Applicable, pt took Bystolic this am

## 2011-04-22 NOTE — Interval H&P Note (Signed)
History and Physical Interval Note:  04/22/2011 7:42 AM  [name] [name]  has presented today for surgery, with the diagnosis of INTERSTITIAL LUNG DISEASE  The various methods of treatment have been discussed with the patient and family. After consideration of risks, benefits and other options for treatment, the patient has consented to  Procedure(s): VIDEO ASSISTED THORACOSCOPY as a surgical intervention .  The patients' history has been reviewed, patient examined, no change in status, stable for surgery.  I have reviewed the patients' chart and labs.  Questions were answered to the patient's satisfaction.     Loukisha Gunnerson C

## 2011-04-22 NOTE — Brief Op Note (Addendum)
   04/22/2011  9:25 AM  PATIENT:  [name] [name]  78 y.o. female  PRE-OPERATIVE DIAGNOSIS:  INTERSTITIAL LUNG DISEASE  POST-OPERATIVE DIAGNOSIS:  INTERSTITIAL LUNG DISEASE - pending lab results  PROCEDURE:  Procedure(s): LEFT VIDEO ASSISTED THORACOSCOPY, LEFT UPPER LOBE BIOPSIES x 2  SURGEON:  Surgeon(s): Melrose Nakayama, MD  ASSISTANT: Suzzanne Cloud, PA-C  ANESTHESIA:   general  SPECIMEN:  Source of Specimen:  LUL  DISPOSITION OF SPECIMEN:  Pathology, Fungal and AFB cultures  DRAINS: 28 Fr CT x 1  PATIENT CONDITION:  PACU - hemodynamically stable.  Frozen section revealed interstitial lung disease at site of biopsy, no definitive diagnosis could be made on frozen, but adequate tissue was present for determination on final pathology

## 2011-04-22 NOTE — Anesthesia Preprocedure Evaluation (Signed)
Anesthesia Evaluation  Patient identified by MRN, date of birth, ID band Patient awake    Reviewed: Allergy & Precautions, H&P , NPO status , Patient's Chart, lab work & pertinent test results  Airway Mallampati: II  Neck ROM: full    Dental   Pulmonary former smoker         Cardiovascular hypertension,     Neuro/Psych PSYCHIATRIC DISORDERS Depression  Neuromuscular disease CVA    GI/Hepatic   Endo/Other  Diabetes mellitus-Hypothyroidism   Renal/GU      Musculoskeletal   Abdominal   Peds  Hematology   Anesthesia Other Findings   Reproductive/Obstetrics                           Anesthesia Physical Anesthesia Plan  ASA: III  Anesthesia Plan: General   Post-op Pain Management:    Induction: Intravenous  Airway Management Planned: Oral ETT and Double Lumen EBT  Additional Equipment:   Intra-op Plan:   Post-operative Plan: Extubation in OR  Informed Consent: I have reviewed the patients History and Physical, chart, labs and discussed the procedure including the risks, benefits and alternatives for the proposed anesthesia with the patient or authorized representative who has indicated his/her understanding and acceptance.     Plan Discussed with: CRNA and Surgeon  Anesthesia Plan Comments:         Anesthesia Quick Evaluation

## 2011-04-22 NOTE — H&P (Signed)
PCP is [name] Post, MD  Referring Provider is Deneise Lever, MD  Chief Complaint   Patient presents with   .  Pneumonia     bialteral and air-space disease. Needs Vats bx for diagnosis    HPI:  78 year old woman who was in her usual state of health until October of this year. She says she initially presented with a sinus infection she was treated with antibiotics, her sinuses improved, but she continued to note a lot of mucus and clearing her throat. She also noted some shortness of breath. A chest x-ray suggested pneumonia she was once again treated with antibiotics. During this time she noted that she felt very weak and had a poor appetite. A CT of the chest on 03/24/2011 showed patchy air space disease involving both lungs, it was felt this likely represented pneumonia and possibly some intra-alveolar hemorrhage. She was sent to Dr. Keturah Barre and he felt this could represent Wegener's granulomatosis or vasculitis. Her ESR was significantly elevated. She is now sent for consideration for open lung biopsy.  Past Medical History   Diagnosis  Date   .  HYPOTHYROIDISM  07/24/2008   .  HYPERLIPIDEMIA  07/24/2008   .  DEPRESSION  05/16/2009   .  HYPERTENSION  07/24/2008   .  Vertigo    .  Arthritis    .  Breast cancer    .  CVA (cerebral vascular accident)    .  Diabetes mellitus type II     Past Surgical History   Procedure  Date   .  Knee surgery  2009     TKR   .  Cholecystectomy  1980   .  Breast surgery  2007     Lumpectomy, XRT 2006    Family History   Problem  Relation  Age of Onset   .  Arthritis  Mother    .  Rheum arthritis  Mother    .  Heart disease  Father    .  Coronary artery disease  Father    .  Cancer  Sister       breast CA, both sisters    .  Breast cancer  Sister    .  Coronary artery disease  Brother    .  Lung cancer  Brother     Social History  History   Substance Use Topics   .  Smoking status:  Former Smoker -- 0.5 packs/day for 12 years     Types:  Cigarettes     Quit date:  06/04/1982   .  Smokeless tobacco:  Not on file   .  Alcohol Use:  Not on file    Current Outpatient Prescriptions   Medication  Sig  Dispense  Refill   .  amLODipine-olmesartan (AZOR) 5-40 MG per tablet  Take 1 tablet by mouth daily.  30 tablet  5   .  aspirin 81 MG tablet  Take 81 mg by mouth daily.     Marland Kitchen  BYSTOLIC 10 MG tablet  TAKE ONE TABLET BY MOUTH EVERY DAY  30 each  11   .  calcium citrate (CALCITRATE) 950 MG tablet  Take 1 tablet by mouth daily. Takes 1200 mg daily     .  cyanocobalamin 100 MCG tablet  Take 100 mcg by mouth daily.     .  diazepam (VALIUM) 5 MG tablet  TAKE ONE TABLET BY MOUTH EVERY DAY AS NEEDED  30 tablet  2   .  ergocalciferol (VITAMIN D2) 50000 UNITS capsule  Take 50,000 Units by mouth once a week.     .  FeFum-FePo-FA-B Cmp-C-Zn-Mn-Cu (RE DUALVIT PLUS) 162-115.2-1 MG CAPS  TAKE ONE CAPSULE BY MOUTH EVERY DAY  30 each  10   .  fluticasone (FLONASE) 50 MCG/ACT nasal spray  Place 2 sprays into the nose daily.  16 g  12   .  glipiZIDE (GLUCOTROL) 10 MG tablet  TAKE ONE TABLET BY MOUTH TWICE DAILY  180 tablet  3   .  L-Methylfolate-B6-B12 (METANX) 3-35-2 MG TABS  Take one tab by mouth daily  90 tablet  3   .  levothyroxine (LEVOTHROID) 125 MCG tablet  Take 1 tablet (125 mcg total) by mouth daily.  30 tablet  11   .  metFORMIN (GLUCOPHAGE-XR) 500 MG 24 hr tablet  Take 500 mg by mouth. Two tabs two times a day (must equal 1000 mg two times daily)     .  Multiple Vitamins-Minerals (CENTRUM ULTRA WOMENS) TABS  Take by mouth daily.     .  predniSONE (DELTASONE) 10 MG tablet  Take as directed  100 tablet  1   .  pyridoxine (B-6) 200 MG tablet  Take 200 mg by mouth daily.     Marland Kitchen  scopolamine (TRANSDERM-SCOP) 1.5 MG  Place 1 patch (1.5 mg total) onto the skin every 3 (three) days.  4 patch  3   .  rosuvastatin (CRESTOR) 5 MG tablet  Take 5 mg by mouth daily.      Allergies   Allergen  Reactions   .  Amoxicillin      GI upset   .   Codeine Sulfate      REACTION: GI upset   .  Sulfonamide Derivatives      REACTION: GI upset    Review of Systems  Constitutional: Positive for appetite change. Negative for unexpected weight change.  Eyes: Negative.  Respiratory: Positive for shortness of breath. Negative for wheezing and stridor.  Blood in sputum  Cardiovascular: Negative for chest pain, palpitations and leg swelling.  Heart murmur, at least since 2008  Gastrointestinal: Negative.  Genitourinary: Negative.  Musculoskeletal:  TKR in 2009 or 10  Neurological: Positive for dizziness.  TIA 2008  Uses a walker b/c vertigo and prior TKR  Hematological: Does not bruise/bleed easily.  All other systems reviewed and are negative.   BP 140/68  Pulse 82  Resp 18  Ht 5' 7.5" (1.715 m)  Wt 202 lb (91.627 kg)  BMI 31.17 kg/m2  SpO2 92%  Physical Exam  Vitals reviewed.  Constitutional: She is oriented to person, place, and time. She appears well-developed and well-nourished. No distress.  overweight  HENT:  Head: Normocephalic and atraumatic.  Eyes: Pupils are equal, round, and reactive to light.  Neck: Normal range of motion. Neck supple. No JVD present. No tracheal deviation present. No thyromegaly present.  Cardiovascular: Normal rate and regular rhythm.  Murmur (2/6 systolic murmur) heard.  Pulmonary/Chest: Effort normal.  Coarse BS L>R  Abdominal: Soft. There is no tenderness.  Musculoskeletal: She exhibits no edema.  Lymphadenopathy:  She has no cervical adenopathy.  Neurological: She is alert and oriented to person, place, and time. No cranial nerve deficit.  Skin: Skin is warm and dry.  Psychiatric: She has a normal mood and affect.   Diagnostic Tests:  CT of the chest from 03/24/2011 is reviewed, there is diffuse airspace disease left greater than right  Impression:  78 year old woman  who presents with weakness, shortness of breath, poor appetite, and blood in sputum was found to have diffuse airspace  disease in both lungs. She has not responded to treatment with antibiotics on multiple occasions and her ESR is significantly elevated, raising the possibility of an inflammatory etiology such as a vasculitis. She needs a lung biopsy to establish a diagnosis, so that she can be appropriately treated.  I have discussed with the patient and her husband(who is a patient of mine) the general nature of the procedure, need for general anesthesia,and incisions to be used. They understand this is a diagnostic and not a therapeutic procedure. I have discussed with them the expected hospital stay, overall recovery and short and long term outcomes. They understand the risks include but are not limited to death, stroke, MI, DVT/PE, bleeding, possible need for transfusion, infections,other organ system dysfunction including respiratory, renal, or GI complications. She understands and accepts these risks and agrees to proceed. They also understand there is a small possibility the procedure will not be diagnostic.  Plan:  Left vats, lung biopsy on Tuesday, January 15. She will be admitted on the day of surgery

## 2011-04-23 ENCOUNTER — Other Ambulatory Visit: Payer: Self-pay | Admitting: *Deleted

## 2011-04-23 ENCOUNTER — Inpatient Hospital Stay (HOSPITAL_COMMUNITY): Payer: Medicare Other

## 2011-04-23 DIAGNOSIS — R918 Other nonspecific abnormal finding of lung field: Secondary | ICD-10-CM | POA: Diagnosis not present

## 2011-04-23 DIAGNOSIS — E039 Hypothyroidism, unspecified: Secondary | ICD-10-CM

## 2011-04-23 LAB — CBC
Hemoglobin: 9.2 g/dL — ABNORMAL LOW (ref 12.0–15.0)
MCH: 29.1 pg (ref 26.0–34.0)
MCHC: 33.3 g/dL (ref 30.0–36.0)
Platelets: 271 10*3/uL (ref 150–400)
RDW: 13.1 % (ref 11.5–15.5)

## 2011-04-23 LAB — BASIC METABOLIC PANEL
BUN: 8 mg/dL (ref 6–23)
GFR calc non Af Amer: 81 mL/min — ABNORMAL LOW (ref >90)
Glucose, Bld: 176 mg/dL — ABNORMAL HIGH (ref 70–99)
Potassium: 4.4 mEq/L (ref 3.5–5.1)

## 2011-04-23 LAB — GLUCOSE, CAPILLARY
Glucose-Capillary: 196 mg/dL — ABNORMAL HIGH (ref 70–99)
Glucose-Capillary: 174 mg/dL — ABNORMAL HIGH (ref 70–99)
Glucose-Capillary: 196 mg/dL — ABNORMAL HIGH (ref 70–99)

## 2011-04-23 LAB — BLOOD GAS, ARTERIAL
Acid-Base Excess: 1.3 mmol/L (ref 0.0–2.0)
Drawn by: 10006
O2 Content: 2 L/min
O2 Saturation: 97.1 %
pO2, Arterial: 84.6 mmHg (ref 80.0–100.0)

## 2011-04-23 MED ORDER — INSULIN ASPART 100 UNIT/ML ~~LOC~~ SOLN
0.0000 [IU] | Freq: Every day | SUBCUTANEOUS | Status: DC
  Administered 2011-04-24: 2 [IU] via SUBCUTANEOUS

## 2011-04-23 MED ORDER — POTASSIUM CHLORIDE IN NACL 20-0.9 MEQ/L-% IV SOLN
INTRAVENOUS | Status: DC
  Administered 2011-04-25: 16:00:00 via INTRAVENOUS
  Filled 2011-04-23 (×2): qty 1000

## 2011-04-23 MED ORDER — ENOXAPARIN SODIUM 40 MG/0.4ML ~~LOC~~ SOLN
40.0000 mg | SUBCUTANEOUS | Status: DC
  Administered 2011-04-23 – 2011-04-26 (×4): 40 mg via SUBCUTANEOUS
  Filled 2011-04-23 (×6): qty 0.4

## 2011-04-23 MED ORDER — INSULIN ASPART 100 UNIT/ML ~~LOC~~ SOLN
0.0000 [IU] | Freq: Three times a day (TID) | SUBCUTANEOUS | Status: DC
  Administered 2011-04-24: 4 [IU] via SUBCUTANEOUS
  Administered 2011-04-25: 3 [IU] via SUBCUTANEOUS
  Administered 2011-04-25: 7 [IU] via SUBCUTANEOUS
  Filled 2011-04-23: qty 3

## 2011-04-23 MED ORDER — LEVOTHYROXINE SODIUM 125 MCG PO TABS: 125.0000 ug | ORAL_TABLET | Freq: Every day | ORAL | Status: DC

## 2011-04-23 MED ORDER — FUROSEMIDE 10 MG/ML IJ SOLN
20.0000 mg | Freq: Once | INTRAMUSCULAR | Status: AC
  Administered 2011-04-23: 20 mg via INTRAVENOUS
  Filled 2011-04-23 (×2): qty 2

## 2011-04-23 NOTE — Progress Notes (Signed)
1 Day Post-Op Procedure(s) (LRB): VIDEO ASSISTED THORACOSCOPY (Left) Subjective: C/o difficulty coughing, incisional pain  Objective: Vital signs in last 24 hours: Temp:  [97.2 F (36.2 C)-98.6 F (37 C)] 98.6 F (37 C) (01/16 0351) Pulse Rate:  [58-83] 75  (01/16 0351) Cardiac Rhythm:  [-] Normal sinus rhythm (01/16 0351) Resp:  [10-31] 30  (01/16 0422) BP: (136-186)/(59-92) 159/71 mmHg (01/16 0351) SpO2:  [90 %-100 %] 98 % (01/16 0811) Arterial Line BP: (169-185)/(70) 185/70 mmHg (01/16 0120) Weight:  [90.5 kg (199 lb 8.3 oz)] 90.5 kg (199 lb 8.3 oz) (01/15 1600)  Hemodynamic parameters for last 24 hours:    Intake/Output from previous day: 01/15 0701 - 01/16 0700 In: 3980 [P.O.:1080; I.V.:2700; IV Piggyback:200] Out: 0355 [Urine:2300; Chest Tube:190] Intake/Output this shift:    General appearance: alert and no distress Neurologic: intact Lungs: diminished breath sounds bibasilar  Lab Results:  Basename 04/23/11 0430  WBC 9.8  HGB 9.2*  HCT 27.6*  PLT 271   BMET:  Basename 04/23/11 0430 04/22/11 0604  NA 124* 130*  K 4.4 4.5  CL 92* 93*  CO2 25 25  GLUCOSE 176* 176*  BUN 8 14  CREATININE 0.72 0.88  CALCIUM 8.9 9.1    PT/INR: No results found for this basename: LABPROT,INR in the last 72 hours ABG    Component Value Date/Time   PHART 7.409* 04/23/2011 0420   HCO3 25.5* 04/23/2011 0420   TCO2 26.7 04/23/2011 0420   ACIDBASEDEF 1.1 04/18/2011 1351   O2SAT 97.1 04/23/2011 0420   CBG (last 3)   Basename 04/23/11 0644 04/23/11 0028 04/22/11 1753  GLUCAP 174* 196* 208*    Assessment/Plan: S/P Procedure(s) (LRB): VIDEO ASSISTED THORACOSCOPY (Left) POD #1 Lung biopsy CXR pending Small air leak, change CT to water seal Hyponatremia- getting D51/2 NS- change to saline at [facility], lasix Hyperglycemia- restart PO meds, AC/HS SSI, stop D5 Mobilize    LOS: 1 day     Roberth Berling C 04/23/2011

## 2011-04-23 NOTE — Op Note (Signed)
NAMEMAIGEN, MOZINGO                 ACCOUNT NO.:  [id number]  MEDICAL RECORD NO.:  [id number]  LOCATION:  4010                         FACILITY:  [facility]  PHYSICIAN:  Revonda Standard. Roxan Hockey, M.D.DATE OF BIRTH:  1932-10-07  DATE OF PROCEDURE:  04/22/2011 DATE OF DISCHARGE:                              OPERATIVE REPORT   PREOPERATIVE DIAGNOSIS:  Interstitial lung disease, ? vasculitis.  POSTOPERATIVE DIAGNOSIS:  Interstitial lung disease, ? vasculitis.  PROCEDURE:  Left video-assisted thoracoscopy, lung biopsy x2.  SURGEON:  Revonda Standard. Roxan Hockey, MD  ASSISTANT:  Suzzanne Cloud, P.A.  ANESTHESIA:  General.  FINDINGS:  Frozen section revealed interstitial lung disease.  No definitive diagnosis could be made on frozen section, adequate tissue for determination on permanent section.  CLINICAL NOTE:  Ms. Masters is a 78 year old woman who presents with a chief complaint of cough, shortness of breath, weakness, and poor appetite.  A chest CT showed patchy airspace disease involving both lungs, ? interstitial lung disease.  She was sent for lung biopsy to establish a diagnosis and guide treatment.  The indications, risks, benefits, and alternatives were discussed in detail with the patient. She understood and accepted the risks of the procedure, understood that it was strictly diagnostic and not therapeutic, and agreed to proceed.  OPERATIVE NOTE:  Ms. Dow was brought to the preoperative holding area on April 21, 2010, there the Anesthesia Service placed central venous access and arterial blood pressure monitoring line.  Intravenous antibiotics were administered.  She was taken to the operating room, anesthetized, and intubated with a double-lumen endotracheal tube.  PAS hose were placed for DVT prophylaxis.  She was placed into a right lateral decubitus position and the left chest was prepped and draped in usual sterile fashion.  Single lung ventilation of the right lung was carried  out and was tolerated well throughout the procedure.  An incision was made in the midaxillary line in the approximately seventh intercostal space, it was carried through the skin and subcutaneous tissue.  The chest was entered bluntly using a hemostat.  A port was inserted and the thoracoscope was placed within the port.  Initially, there was delayed deflation of the lung.  Suction catheter was applied to the left side to assist in evacuating the trapped air.  Additional port incisions were made in approximately the fourth or fifth intercostal space anteriorly and posteriorly for instrumentation.  Review of the CT showed that there was extensive involvement of the upper lobe, both anterior laterally as well as in the area of the lingula.  The lingula was initially grasped and a portion resected with sequential firings of an Endo-GIA stapler.  The specimen was removed, portion was sent for cultures for AFB and fungal organisms, the remainder was sent for permanent pathology.  Superior to this site, there was an area where the lung had a more nodular feel on palpation. A wedge resection was performed in this area.  Again, there was sequential firings of an Endo-GIA stapler were performed.  There was good hemostasis at the staple line.  The specimen was sent for frozen section.  Frozen section revealed interstitial lung disease and adequate tissue for diagnostic studies.  No formal diagnosis could be made on the frozen section.  The staple lines were once again inspected for hemostasis.  The port sites were inspected and there was no significant bleeding.  A 28-French chest tube was placed and secured with two #1 silk sutures.  The lung was reinflated.  The thoracoscope was removed.  The remaining 2 incisions were closed with 0 Vicryl fascial sutures and 3-0 Vicryl subcuticular sutures.  All sponge, needle, and instrument counts were correct at the end of the procedure.  The patient was  extubated in the operating room and taken to the postanesthetic care unit in good condition.     Revonda Standard Roxan Hockey, M.D.     SCH/MEDQ  D:  04/22/2011  T:  04/23/2011  Job:  [id number]

## 2011-04-24 ENCOUNTER — Encounter (HOSPITAL_COMMUNITY): Payer: Self-pay | Admitting: Thoracic Surgery (Cardiothoracic Vascular Surgery)

## 2011-04-24 ENCOUNTER — Inpatient Hospital Stay (HOSPITAL_COMMUNITY): Payer: Medicare Other

## 2011-04-24 DIAGNOSIS — Z09 Encounter for follow-up examination after completed treatment for conditions other than malignant neoplasm: Secondary | ICD-10-CM | POA: Diagnosis not present

## 2011-04-24 DIAGNOSIS — R918 Other nonspecific abnormal finding of lung field: Secondary | ICD-10-CM | POA: Diagnosis not present

## 2011-04-24 LAB — CBC
Hemoglobin: 9.1 g/dL — ABNORMAL LOW (ref 12.0–15.0)
MCHC: 32.2 g/dL (ref 30.0–36.0)
RDW: 13.3 % (ref 11.5–15.5)

## 2011-04-24 LAB — BASIC METABOLIC PANEL
GFR calc Af Amer: 68 mL/min — ABNORMAL LOW (ref >90)
GFR calc non Af Amer: 59 mL/min — ABNORMAL LOW (ref >90)
Glucose, Bld: 170 mg/dL — ABNORMAL HIGH (ref 70–99)
Potassium: 4 mEq/L (ref 3.5–5.1)
Sodium: 124 mEq/L — ABNORMAL LOW (ref 135–145)

## 2011-04-24 LAB — GLUCOSE, CAPILLARY: Glucose-Capillary: 186 mg/dL — ABNORMAL HIGH (ref 70–99)

## 2011-04-24 MED ORDER — TRAMADOL HCL 50 MG PO TABS
50.0000 mg | ORAL_TABLET | Freq: Four times a day (QID) | ORAL | Status: DC | PRN
Start: 2011-04-24 — End: 2011-04-26

## 2011-04-24 NOTE — Discharge Summary (Signed)
[address]            [place] [address]          [phone]      [name] [name] [birth year] 78 y.o. [id number]  04/22/2011   Melrose Nakayama, MD  INTERSTITIAL LUNG DISEASE  HPI: At time of consultation 78 year old woman who was in her usual state of health until October of this year. She says she initially presented with a sinus infection she was treated with antibiotics, her sinuses improved, but she continued to note a lot of mucus and clearing her throat. She also noted some shortness of breath. A chest x-ray suggested pneumonia she was once again treated with antibiotics. During this time she noted that she felt very weak and had a poor appetite. A CT of the chest on 03/24/2011 showed patchy air space disease involving both lungs, it was felt this likely represented pneumonia and possibly some intra-alveolar hemorrhage. She was sent to Dr. Keturah Barre and he felt this could represent Wegener's granulomatosis or vasculitis. Her ESR was significantly elevated. She is now sent for consideration for open lung biopsy.  Past Medical History   Diagnosis  Date   .  HYPOTHYROIDISM  07/24/2008   .  HYPERLIPIDEMIA  07/24/2008   .  DEPRESSION  05/16/2009   .  HYPERTENSION  07/24/2008   .  Vertigo    .  Arthritis    .  Breast cancer    .  CVA (cerebral vascular accident)    .  Diabetes mellitus type II     Past Surgical History   Procedure  Date   .  Knee surgery  2009     TKR   .  Cholecystectomy  1980   .  Breast surgery  2007     Lumpectomy, XRT 2006    Family History   Problem  Relation  Age of Onset   .  Arthritis  Mother    .  Rheum arthritis  Mother    .  Heart disease  Father    .  Coronary artery disease  Father    .  Cancer  Sister       breast CA, both sisters    .  Breast cancer  Sister    .  Coronary artery disease  Brother    .  Lung cancer  Brother    Social History  History   Substance Use Topics   .  Smoking status:   Former Smoker -- 0.5 packs/day for 12 years     Types:  Cigarettes     Quit date:  06/04/1982   .  Smokeless tobacco:  Not on file   .  Alcohol Use:  Not on file    Current Outpatient Prescriptions   Medication  Sig  Dispense  Refill   .  amLODipine-olmesartan (AZOR) 5-40 MG per tablet  Take 1 tablet by mouth daily.  30 tablet  5   .  aspirin 81 MG tablet  Take 81 mg by mouth daily.     Marland Kitchen  BYSTOLIC 10 MG tablet  TAKE ONE TABLET BY MOUTH EVERY DAY  30 each  11   .  calcium citrate (CALCITRATE) 950 MG tablet  Take 1 tablet by mouth daily. Takes 1200 mg daily     .  cyanocobalamin 100 MCG tablet  Take 100  mcg by mouth daily.     .  diazepam (VALIUM) 5 MG tablet  TAKE ONE TABLET BY MOUTH EVERY DAY AS NEEDED  30 tablet  2   .  ergocalciferol (VITAMIN D2) 50000 UNITS capsule  Take 50,000 Units by mouth once a week.     .  FeFum-FePo-FA-B Cmp-C-Zn-Mn-Cu (RE DUALVIT PLUS) 162-115.2-1 MG CAPS  TAKE ONE CAPSULE BY MOUTH EVERY DAY  30 each  10   .  fluticasone (FLONASE) 50 MCG/ACT nasal spray  Place 2 sprays into the nose daily.  16 g  12   .  glipiZIDE (GLUCOTROL) 10 MG tablet  TAKE ONE TABLET BY MOUTH TWICE DAILY  180 tablet  3   .  L-Methylfolate-B6-B12 (METANX) 3-35-2 MG TABS  Take one tab by mouth daily  90 tablet  3   .  levothyroxine (LEVOTHROID) 125 MCG tablet  Take 1 tablet (125 mcg total) by mouth daily.  30 tablet  11   .  metFORMIN (GLUCOPHAGE-XR) 500 MG 24 hr tablet  Take 500 mg by mouth. Two tabs two times a day (must equal 1000 mg two times daily)     .  Multiple Vitamins-Minerals (CENTRUM ULTRA WOMENS) TABS  Take by mouth daily.     .  predniSONE (DELTASONE) 10 MG tablet  Take as directed  100 tablet  1   .  pyridoxine (B-6) 200 MG tablet  Take 200 mg by mouth daily.     Marland Kitchen  scopolamine (TRANSDERM-SCOP) 1.5 MG  Place 1 patch (1.5 mg total) onto the skin every 3 (three) days.  4 patch  3   .  rosuvastatin (CRESTOR) 5 MG tablet  Take 5 mg by mouth daily.      Allergies   Allergen   Reactions   .  Amoxicillin      GI upset   .  Codeine Sulfate      REACTION: GI upset   .  Sulfonamide Derivatives      REACTION: GI upset   Review of Systems at time of consultation Constitutional: Positive for appetite change. Negative for unexpected weight change.  Eyes: Negative.  Respiratory: Positive for shortness of breath. Negative for wheezing and stridor.  Blood in sputum  Cardiovascular: Negative for chest pain, palpitations and leg swelling.  Heart murmur, at least since 2008  Gastrointestinal: Negative.  Genitourinary: Negative.  Musculoskeletal:  TKR in 2009 or 10  Neurological: Positive for dizziness.  TIA 2008  Uses a walker b/c vertigo and prior TKR  Hematological: Does not bruise/bleed easily.  All other systems reviewed and are negative.  BP 140/68  Pulse 82  Resp 18  Ht 5' 7.5" (1.715 m)  Wt 202 lb (91.627 kg)  BMI 31.17 kg/m2  SpO2 92%  Physical Exam at time of consultation Vitals reviewed.  Constitutional: She is oriented to person, place, and time. She appears well-developed and well-nourished. No distress.  overweight  HENT:  Head: Normocephalic and atraumatic.  Eyes: Pupils are equal, round, and reactive to light.  Neck: Normal range of motion. Neck supple. No JVD present. No tracheal deviation present. No thyromegaly present.  Cardiovascular: Normal rate and regular rhythm.  Murmur (2/6 systolic murmur) heard.  Pulmonary/Chest: Effort normal.  Coarse BS L>R  Abdominal: Soft. There is no tenderness.  Musculoskeletal: She exhibits no edema.  Lymphadenopathy:  She has no cervical adenopathy.  Neurological: She is alert and oriented to person, place, and time. No cranial nerve deficit.  Skin: Skin is warm  and dry.  Psychiatric: She has a normal mood and affect.  Diagnostic Tests:  CT of the chest from 03/24/2011 is reviewed, there is diffuse airspace disease left greater than right  Impression: At time of consultation by Modesto Charon  M.D. 78 year old woman who presents with weakness, shortness of breath, poor appetite, and blood in sputum was found to have diffuse airspace disease in both lungs. She has not responded to treatment with antibiotics on multiple occasions and her ESR is significantly elevated, raising the possibility of an inflammatory etiology such as a vasculitis. She needs a lung biopsy to establish a diagnosis, so that she can be appropriately treated.  I have discussed with the patient and her husband(who is a patient of mine) the general nature of the procedure, need for general anesthesia,and incisions to be used. They understand this is a diagnostic and not a therapeutic procedure. I have discussed with them the expected hospital stay, overall recovery and short and long term outcomes. They understand the risks include but are not limited to death, stroke, MI, DVT/PE, bleeding, possible need for transfusion, infections,other organ system dysfunction including respiratory, renal, or GI complications. She understands and accepts these risks and agrees to proceed. They also understand there is a small possibility the procedure will not be diagnostic    Hospital Course:  The patient was admitted to the hospital and taken to the operating room on 04/22/2011 and underwent Procedure(s):  OPERATIVE REPORT  PREOPERATIVE DIAGNOSIS: Interstitial lung disease, ? vasculitis.  POSTOPERATIVE DIAGNOSIS: Interstitial lung disease, ? vasculitis.  PROCEDURE: Left video-assisted thoracoscopy, lung biopsy x2.  SURGEON: Revonda Standard. Roxan Hockey, MD  ASSISTANT: Suzzanne Cloud, P.A.  ANESTHESIA: General.  FINDINGS: Frozen section revealed interstitial lung disease. No  definitive diagnosis could be made on frozen section, adequate tissue  for determination on permanent section.    Postoperative hospital course:  Patient has progressed quite nicely. She has remained hemodynamically stable. All routine lines, monitors, drainage  devices have been discontinued in the standard fashion. Final pathology is currently pending. She is tolerating routine rehabilitation modalities. Incisions are healing well without evidence of infection. He does have a postoperative hyponatremia. Meds have been adjusted and she has also been diuresed some. Her most recent sodium is 124. Currently oxygen has been weaned and she maintained good saturations on 2 L nasal cannula. We hope to discontinue completely prior to discharge. Currently she is tentatively felt to be stable for discharge in the next 1-2 days pending further ongoing recovery.  Basename 04/24/11 0405 04/23/11 0430  NA 124* 124*  K 4.0 4.4  CL 88* 92*  CO2 26 25  GLUCOSE 170* 176*  BUN 15 8  CALCIUM 8.8 8.9    Basename 04/24/11 0405 04/23/11 0430  WBC 11.1* 9.8  HGB 9.1* 9.2*  HCT 28.3* 27.6*  PLT 318 271   No results found for this basename: INR:2 in the last 72 hours   Discharge Instructions:  The patient is discharged to home with extensive instructions on wound care and progressive ambulation.  They are instructed not to drive or perform any heavy lifting until returning to see the physician in his office.  Discharge Diagnosis:  INTERSTITIAL LUNG DISEASE  Secondary Diagnosis: Patient Active Problem List  Diagnoses  . HYPOTHYROIDISM  . HYPERLIPIDEMIA  . HYPONATREMIA  . DEPRESSION  . HYPERTENSION  . UTI  . OSTEOARTHRITIS  . ABDOMINAL PAIN  . EDEMA  . ABDOMINAL PAIN, LEFT LOWER QUADRANT  . Type 2 diabetes mellitus  .  Vertigo, intermittent  . Polymyalgia  .  pneumonia- bilateral    Past Medical History  Diagnosis Date  . HYPOTHYROIDISM 07/24/2008  . HYPERLIPIDEMIA 07/24/2008  . DEPRESSION 05/16/2009  . HYPERTENSION 07/24/2008  . Vertigo   . Breast cancer   . Diabetes mellitus type II   . Arthritis     r tkr  . CVA (cerebral vascular accident) 2008    mini stroke.no residual  . Heart murmur     stress test 2009.dr peter [place]  . Intermittent  vertigo   . Skin abnormality     facial lesions .pt applying mupiracin to areas       [name] [name], [name] [name]  Home Medication Instructions [id number]   Printed on:04/24/11 1315  Medication Information                    aspirin 81 MG tablet Take 81 mg by mouth daily.             Multiple Vitamins-Minerals (CENTRUM ULTRA WOMENS) TABS Take by mouth daily.             rosuvastatin (CRESTOR) 5 MG tablet Take 5 mg by mouth daily.             metFORMIN (GLUCOPHAGE-XR) 500 MG 24 hr tablet Take 1,000 mg by mouth 2 (two) times daily. Two tabs two times a day (must equal 1000 mg two times daily)           L-Methylfolate-B6-B12 (METANX) 3-35-2 MG TABS Take one tab by mouth daily           FeFum-FePo-FA-B Cmp-C-Zn-Mn-Cu (RE DUALVIT PLUS) 162-115.2-1 MG CAPS TAKE ONE CAPSULE BY MOUTH EVERY DAY           BYSTOLIC 10 MG tablet TAKE ONE TABLET BY MOUTH EVERY DAY           glipiZIDE (GLUCOTROL) 10 MG tablet TAKE ONE TABLET BY MOUTH TWICE DAILY           amLODipine-olmesartan (AZOR) 5-40 MG per tablet Take 1 tablet by mouth daily.           CALCIUM CITRATE PO Take 1,200 mg by mouth daily.             cholecalciferol (VITAMIN D) 1000 UNITS tablet Take 3,000 Units by mouth daily.             pyridOXINE (VITAMIN B-6) 100 MG tablet Take 100 mg by mouth daily.             scopolamine (TRANSDERM-SCOP) 1.5 MG Place 1 patch onto the skin every 3 (three) days as needed. For vertigo.            fluticasone (FLONASE) 50 MCG/ACT nasal spray Place 2 sprays into the nose daily as needed. For stuffy nose.            diazepam (VALIUM) 5 MG tablet Take 5 mg by mouth every 6 (six) hours as needed. For anxiety           predniSONE (DELTASONE) 5 MG tablet Take 5 mg by mouth every morning.           levothyroxine (LEVOTHROID) 125 MCG tablet Take 1 tablet (125 mcg total) by mouth daily.           traMADol (ULTRAM) 50 MG tablet Take 1-2 tablets (50-100 mg total) by mouth every 6 (six) hours as  needed for pain.  Disposition: Home  Patient's condition is Good  Jadene Pierini, PA-C 04/24/2011  1:15 PM

## 2011-04-24 NOTE — Progress Notes (Addendum)
[address]            [place] [address]          [phone]     2 Days Post-Op  Procedure(s) (LRB): VIDEO ASSISTED THORACOSCOPY (Left) Subjective: Feeling much better, coughed allot yesterday, but today less so  Objective  Telemetry NSR, PAC'S  Temp:  [98.4 F (36.9 C)-99.6 F (37.6 C)] 98.6 F (37 C) (01/17 0405) Pulse Rate:  [70-95] 95  (01/17 0405) Resp:  [20-24] 20  (01/17 0405) BP: (115-132)/(61-69) 132/69 mmHg (01/17 0405) SpO2:  [93 %-98 %] 93 % (01/17 0933)   Intake/Output Summary (Last 24 hours) at 04/24/11 1059 Last data filed at 04/24/11 0600  Gross per 24 hour  Intake    122 ml  Output   2975 ml  Net  -2853 ml       General appearance: alert and no distress Heart: regular rate and rhythm and S1, S2 normal Lungs: slightly course throughout Abdomen: soft, nontender Extremities: extremities normal, atraumatic, no cyanosis or edema Wound: incisions healing well  Lab Results:  Basename 04/24/11 0405 04/23/11 0430  NA 124* 124*  K 4.0 4.4  CL 88* 92*  CO2 26 25  GLUCOSE 170* 176*  BUN 15 8  CREATININE 0.92 0.72  CALCIUM 8.8 8.9  MG -- --  PHOS -- --   No results found for this basename: AST:2,ALT:2,ALKPHOS:2,BILITOT:2,PROT:2,ALBUMIN:2 in the last 72 hours No results found for this basename: LIPASE:2,AMYLASE:2 in the last 72 hours  Basename 04/24/11 0405 04/23/11 0430  WBC 11.1* 9.8  NEUTROABS -- --  HGB 9.1* 9.2*  HCT 28.3* 27.6*  MCV 88.2 87.3  PLT 318 271   No results found for this basename: CKTOTAL:4,CKMB:4,TROPONINI:4 in the last 72 hours No components found with this basename: POCBNP:3 No results found for this basename: DDIMER in the last 72 hours No results found for this basename: HGBA1C in the last 72 hours No results found for this basename: CHOL,HDL,LDLCALC,TRIG,CHOLHDL in the last 72 hours No results found for this basename: TSH,T4TOTAL,FREET3,T3FREE,THYROIDAB in the last 72 hours No  results found for this basename: VITAMINB12,FOLATE,FERRITIN,TIBC,IRON,RETICCTPCT in the last 72 hours  Medications: Scheduled    . acetaminophen  1,000 mg Intravenous Q6H  . albuterol  4 puff Inhalation Q6H  . amLODipine  5 mg Oral Daily   And  . olmesartan  40 mg Oral Daily  . aspirin EC  81 mg Oral Daily  . bisacodyl  10 mg Oral Daily  . calcium citrate  950 mg Oral Daily  . cholecalciferol  3,000 Units Oral Daily  . enoxaparin (LOVENOX) injection  40 mg Subcutaneous Q24H  . fentaNYL   Intravenous Q4H  . furosemide  20 mg Intravenous Once  . glipiZIDE  10 mg Oral BID AC  . insulin aspart  0-20 Units Subcutaneous TID WC  . insulin aspart  0-5 Units Subcutaneous QHS  . levothyroxine  125 mcg Oral Daily  . metFORMIN  1,000 mg Oral BID WC  . nebivolol  10 mg Oral Daily  . predniSONE  5 mg Oral Q0700  . pyridOXINE  100 mg Oral Daily  . rosuvastatin  5 mg Oral Daily     Radiology/Studies:  Dg Chest Port 1 View  04/24/2011  *RADIOLOGY REPORT*  Clinical Data: Status post lung biopsy  PORTABLE CHEST - 1 VIEW  Comparison: 04/23/2011  Findings: There is  a right IJ catheter with tip in the cavoatrial junction.  There is a left-sided chest tube in place.  No pneumothorax visualized.  Bilateral interstitial and airspace densities are noted, unchanged from previous exam.  IMPRESSION:  1.  No change in aeration to the lungs compared with prior exam.  Original Report Authenticated By: Angelita Ingles, M.D.   Dg Chest Port 1 View  04/23/2011  *RADIOLOGY REPORT*  Clinical Data: Status post lung biopsy  PORTABLE CHEST - 1 VIEW  Comparison: 04/22/2011; 04/17/18 13; 12-13/2012; chest CT - 03/24/2011  Findings:  Grossly unchanged enlarged cardiac silhouette and mediastinal contours with minimal rightward deviation of the tracheal air column.  Stable position of support apparatus.  No definite pneumothorax.  There is persistent cephalization of flow with indistinct pulmonary vasculature.  Grossly  unchanged bilateral mid and left lower lung heterogeneous air space opacities.  Grossly unchanged bones.  IMPRESSION: 1.  Stable position of support apparatus.  No pneumothorax. 2.  Grossly unchanged findings of pulmonary edema with scattered heterogeneous air space opacities, possibly atelectasis though multifocal infection is not excluded.  Original Report Authenticated By: Rachel Moulds, M.D.    INR: Will add last result for INR, ABG once components are confirmed Will add last 4 CBG results once components are confirmed  Assessment/Plan: S/P Procedure(s) (LRB): VIDEO ASSISTED THORACOSCOPY (Left)  1. Minimal chest tube drainage, no air leak, will D/C 2. Wean O2 off, pulm toilet/ mobilize 3 D/C foley 4. Hyponatremia stable, 2700 cc negative yesterday  LOS: 2 days    GOLD,WAYNE E 1/17/201310:59 AM    Pt seen and examined Agree with above D/C CT ambulate Path still pending

## 2011-04-25 NOTE — Progress Notes (Signed)
Pt transferred to [id number], report called to receiving nurse and all questions answered. Pt belongings also transferred to new location.

## 2011-04-25 NOTE — Progress Notes (Signed)
Patient ID: [name] [name], female   DOB: 02/10/1933, 78 y.o.   MRN: [id number] Path report 1. Lung, wedge biopsy/resection, left upper lobe - PATCHY ORGANIZING PNEUMONIA WITH CHRONIC INTERSTITIAL PNEUMONIA, HEMOSIDERIN DEPOSITS SUGGESTIVE OF CHRONIC CONGESTION AND FOCAL ATYPICAL GLANDS. SEE COMMENT. 2. Lung, wedge biopsy/resection, left upper lobe - PATCHY ORGANIZING PNEUMONIA WITH CHRONIC INTERSTITIAL PNEUMONIA, HEMOSIDERIN DEPOSITS SUGGESTIVE OF CHRONIC CONGESTION AND FOCAL ATYPICAL GLANDS. SEE COMMENT.   D/w pt and son- will defer any changes in treatment to Dr. Annamaria Boots. Continue prednisone for now.

## 2011-04-25 NOTE — Progress Notes (Signed)
[address]            [place] [address]          [phone]     3 Days Post-Op  Procedure(s) (LRB): VIDEO ASSISTED THORACOSCOPY (Left) Subjective: Feels well  Objective  Telemetry NSR  Temp:  [98 F (36.7 C)-98.7 F (37.1 C)] 98.5 F (36.9 C) (01/18 0429) Pulse Rate:  [69-84] 72  (01/18 0429) Resp:  [17-28] 25  (01/18 0429) BP: (111-145)/(59-77) 129/59 mmHg (01/18 0429) SpO2:  [93 %-98 %] 98 % (01/18 0429)   Intake/Output Summary (Last 24 hours) at 04/25/11 0750 Last data filed at 04/25/11 0700  Gross per 24 hour  Intake    520 ml  Output    715 ml  Net   -195 ml       General appearance: alert and no distress Heart: regular rate and rhythm and S1, S2 normal Lungs: course througout, but good air exchange Abdomen: benign exam Extremities: no  edema Wound: incisions healing well  Lab Results:  Basename 04/24/11 0405 04/23/11 0430  NA 124* 124*  K 4.0 4.4  CL 88* 92*  CO2 26 25  GLUCOSE 170* 176*  BUN 15 8  CREATININE 0.92 0.72  CALCIUM 8.8 8.9  MG -- --  PHOS -- --   No results found for this basename: AST:2,ALT:2,ALKPHOS:2,BILITOT:2,PROT:2,ALBUMIN:2 in the last 72 hours No results found for this basename: LIPASE:2,AMYLASE:2 in the last 72 hours  Basename 04/24/11 0405 04/23/11 0430  WBC 11.1* 9.8  NEUTROABS -- --  HGB 9.1* 9.2*  HCT 28.3* 27.6*  MCV 88.2 87.3  PLT 318 271   No results found for this basename: CKTOTAL:4,CKMB:4,TROPONINI:4 in the last 72 hours No components found with this basename: POCBNP:3 No results found for this basename: DDIMER in the last 72 hours No results found for this basename: HGBA1C in the last 72 hours No results found for this basename: CHOL,HDL,LDLCALC,TRIG,CHOLHDL in the last 72 hours No results found for this basename: TSH,T4TOTAL,FREET3,T3FREE,THYROIDAB in the last 72 hours No results found for this basename: VITAMINB12,FOLATE,FERRITIN,TIBC,IRON,RETICCTPCT in the last 72  hours  Medications: Scheduled    . albuterol  4 puff Inhalation Q6H  . amLODipine  5 mg Oral Daily   And  . olmesartan  40 mg Oral Daily  . aspirin EC  81 mg Oral Daily  . bisacodyl  10 mg Oral Daily  . calcium citrate  950 mg Oral Daily  . cholecalciferol  3,000 Units Oral Daily  . enoxaparin (LOVENOX) injection  40 mg Subcutaneous Q24H  . fentaNYL   Intravenous Q4H  . glipiZIDE  10 mg Oral BID AC  . insulin aspart  0-20 Units Subcutaneous TID WC  . insulin aspart  0-5 Units Subcutaneous QHS  . levothyroxine  125 mcg Oral Daily  . metFORMIN  1,000 mg Oral BID WC  . nebivolol  10 mg Oral Daily  . predniSONE  5 mg Oral Q0700  . pyridOXINE  100 mg Oral Daily  . rosuvastatin  5 mg Oral Daily     Radiology/Studies:  Dg Chest Port 1 View  04/24/2011  *RADIOLOGY REPORT*  Clinical Data: Chest tube removal  PORTABLE CHEST - 1 VIEW  Comparison: Earlier today at of 700 at  Findings: The right IJ central line tip is stable at the cavoatrial junction.  The left chest tube has been removed.  No  pneumothorax. Bilateral airspace opacities, left greater than right, do not appear significantly changed.  IMPRESSION: No pneumothorax after chest tube removal.  No significant change in aeration bilaterally.  Original Report Authenticated By: Duayne Cal, M.D.   Dg Chest Port 1 View  04/24/2011  *RADIOLOGY REPORT*  Clinical Data: Status post lung biopsy  PORTABLE CHEST - 1 VIEW  Comparison: 04/23/2011  Findings: There is a right IJ catheter with tip in the cavoatrial junction.  There is a left-sided chest tube in place.  No pneumothorax visualized.  Bilateral interstitial and airspace densities are noted, unchanged from previous exam.  IMPRESSION:  1.  No change in aeration to the lungs compared with prior exam.  Original Report Authenticated By: Angelita Ingles, M.D.   Dg Chest Port 1 View  04/23/2011  *RADIOLOGY REPORT*  Clinical Data: Status post lung biopsy  PORTABLE CHEST - 1 VIEW  Comparison:  04/22/2011; 04/17/18 13; 12-13/2012; chest CT - 03/24/2011  Findings:  Grossly unchanged enlarged cardiac silhouette and mediastinal contours with minimal rightward deviation of the tracheal air column.  Stable position of support apparatus.  No definite pneumothorax.  There is persistent cephalization of flow with indistinct pulmonary vasculature.  Grossly unchanged bilateral mid and left lower lung heterogeneous air space opacities.  Grossly unchanged bones.  IMPRESSION: 1.  Stable position of support apparatus.  No pneumothorax. 2.  Grossly unchanged findings of pulmonary edema with scattered heterogeneous air space opacities, possibly atelectasis though multifocal infection is not excluded.  Original Report Authenticated By: Rachel Moulds, M.D.    INR: Will add last result for INR, ABG once components are confirmed Will add last 4 CBG results once components are confirmed  Assessment/Plan: S/P Procedure(s) (LRB): VIDEO ASSISTED THORACOSCOPY (Left)  1. Doing well, wean O2 off D/c pca and central line  LOS: 3 days    Shataya Winkles E 1/18/20137:50 AM

## 2011-04-25 NOTE — Progress Notes (Signed)
D/C full dose Fentanyl PCA pump, wasted 330 mcg in sink, [place]rene [place] Love witnessed wasted.

## 2011-04-26 LAB — GLUCOSE, CAPILLARY

## 2011-04-26 MED ORDER — TRAMADOL HCL 50 MG PO TABS: 50.0000 mg | ORAL_TABLET | Freq: Four times a day (QID) | ORAL | Status: DC | PRN

## 2011-04-26 NOTE — Progress Notes (Addendum)
04/26/2011 9:54 AM Nursing Note: Pt. Sitting on bed on RA o2 sat 80%. Pt ambulated on RA o2 sat 82%. 2L o2 [place] applied o2 while walking and  sat increased to 93%. Pa made aware. Orders received.  [name] [name], Arville Lime

## 2011-04-26 NOTE — Progress Notes (Signed)
04/26/2011 12:00 PM Nursing Note Discharge AVS form, RX, follow up appointments, incision care, when to call MD and medications already taken today as well as those due this evening given and explained to patient and husband.  Both verbalized understanding. CTS d/c and steri strips applied per md order prior to dc home. Correct use of home o2 demonstrated and explained as well. IV already d/c. D/c tele. D/c home with o2 2L [place] continuous per prior orders.  Darshawn Boateng, Arville Lime

## 2011-04-26 NOTE — Progress Notes (Addendum)
[address]            [place] [address]          [phone]     4 Days Post-Op  Procedure(s) (LRB): VIDEO ASSISTED THORACOSCOPY (Left) Subjective: Feels well off O2 but sats down to 83%  Objective  Telemetry NSR  Temp:  [97.9 F (36.6 C)-98.8 F (37.1 C)] 98.8 F (37.1 C) (01/19 0429) Pulse Rate:  [70-76] 70  (01/19 0429) Resp:  [20-25] 21  (01/19 0429) BP: (143-149)/(66-76) 145/73 mmHg (01/19 0429) SpO2:  [95 %-97 %] 97 % (01/19 0429)   Intake/Output Summary (Last 24 hours) at 04/26/11 0836 Last data filed at 04/25/11 2100  Gross per 24 hour  Intake    380 ml  Output      0 ml  Net    380 ml       General appearance: alert, cooperative and no distress Heart: regular rate and rhythm and S1, S2 normal Lungs: coarse BS Wound: healing well  Lab Results:  Basename 04/24/11 0405  NA 124*  K 4.0  CL 88*  CO2 26  GLUCOSE 170*  BUN 15  CREATININE 0.92  CALCIUM 8.8  MG --  PHOS --   No results found for this basename: AST:2,ALT:2,ALKPHOS:2,BILITOT:2,PROT:2,ALBUMIN:2 in the last 72 hours No results found for this basename: LIPASE:2,AMYLASE:2 in the last 72 hours  Basename 04/24/11 0405  WBC 11.1*  NEUTROABS --  HGB 9.1*  HCT 28.3*  MCV 88.2  PLT 318   No results found for this basename: CKTOTAL:4,CKMB:4,TROPONINI:4 in the last 72 hours No components found with this basename: POCBNP:3 No results found for this basename: DDIMER in the last 72 hours No results found for this basename: HGBA1C in the last 72 hours No results found for this basename: CHOL,HDL,LDLCALC,TRIG,CHOLHDL in the last 72 hours No results found for this basename: TSH,T4TOTAL,FREET3,T3FREE,THYROIDAB in the last 72 hours No results found for this basename: VITAMINB12,FOLATE,FERRITIN,TIBC,IRON,RETICCTPCT in the last 72 hours  Medications: Scheduled    . amLODipine  5 mg Oral Daily   And  . olmesartan  40 mg Oral Daily  . aspirin EC  81 mg Oral  Daily  . bisacodyl  10 mg Oral Daily  . calcium citrate  950 mg Oral Daily  . cholecalciferol  3,000 Units Oral Daily  . enoxaparin (LOVENOX) injection  40 mg Subcutaneous Q24H  . glipiZIDE  10 mg Oral BID AC  . insulin aspart  0-20 Units Subcutaneous TID WC  . insulin aspart  0-5 Units Subcutaneous QHS  . levothyroxine  125 mcg Oral Daily  . metFORMIN  1,000 mg Oral BID WC  . nebivolol  10 mg Oral Daily  . predniSONE  5 mg Oral Q0700  . pyridOXINE  100 mg Oral Daily  . rosuvastatin  5 mg Oral Daily     Radiology/Studies:  Dg Chest Port 1 View  04/24/2011  *RADIOLOGY REPORT*  Clinical Data: Chest tube removal  PORTABLE CHEST - 1 VIEW  Comparison: Earlier today at of 700 at  Findings: The right IJ central line tip is stable at the cavoatrial junction.  The left chest tube has been removed.  No pneumothorax. Bilateral airspace opacities, left greater than right, do not appear significantly changed.  IMPRESSION: No pneumothorax after chest tube removal.  No significant change in aeration bilaterally.  Original Report Authenticated By: Duayne Cal,  M.D.    INR: Will add last result for INR, ABG once components are confirmed Will add last 4 CBG results once components are confirmed  Assessment/Plan: S/P Procedure(s) (LRB): VIDEO ASSISTED THORACOSCOPY (Left) Plan for discharge: see discharge orders, will arrange for home O2   LOS: 4 days    [name] [name] E 1/19/20138:36 AM

## 2011-04-26 NOTE — Progress Notes (Signed)
CARE MANAGEMENT NOTE 04/26/2011  Patient:  [name] [name], REKOWSKI   Account Number:  [id number]  Date Initiated:  04/23/2011  Documentation initiated by:  [facility]  Subjective/Objective Assessment:   Post op lung surgery, pneumonia     Action/Plan:   lives at home with husband   Anticipated DC Date:  04/28/2011   Anticipated DC Plan:  [place]  CM consult      Choice offered to / List presented to:     DME arranged  [facility]      DME agency  [facility].        Status of service:  Completed, signed off Medicare Important Message given?   (If response is "NO", the following Medicare IM given date fields will be blank) Date Medicare IM given:   Date Additional Medicare IM given:    Discharge Disposition:  HOME/SELF CARE  Per UR Regulation:  Reviewed for med. necessity/level of care/duration of stay  Comments:  04/26/2011 1045 Contacted [facility] for home oxygen for scheduled d/c today. Explained to pt that [facility] will deliver portable to her room that last only 3-4 hours. Explains she will need to contact [place] to deliver concentrator once she gets home. Jonnie Finner RN CCM Case Mgmt phone [phone]  04-23-11 2:10pm Luz Lex, [place] [phone] UR Completed.

## 2011-04-28 ENCOUNTER — Ambulatory Visit: Payer: Medicare Other | Admitting: Internal Medicine

## 2011-05-05 ENCOUNTER — Other Ambulatory Visit: Payer: Self-pay | Admitting: *Deleted

## 2011-05-05 DIAGNOSIS — E871 Hypo-osmolality and hyponatremia: Secondary | ICD-10-CM

## 2011-05-05 DIAGNOSIS — C50919 Malignant neoplasm of unspecified site of unspecified female breast: Secondary | ICD-10-CM

## 2011-05-05 DIAGNOSIS — E785 Hyperlipidemia, unspecified: Secondary | ICD-10-CM

## 2011-05-09 ENCOUNTER — Other Ambulatory Visit: Payer: Self-pay | Admitting: Thoracic Surgery (Cardiothoracic Vascular Surgery)

## 2011-05-09 DIAGNOSIS — J8410 Pulmonary fibrosis, unspecified: Secondary | ICD-10-CM

## 2011-05-12 ENCOUNTER — Ambulatory Visit
Admission: RE | Admit: 2011-05-12 | Discharge: 2011-05-12 | Disposition: A | Discharge status: Home / Self Care | Payer: Medicare Other | Source: Ambulatory Visit | Attending: Thoracic Surgery (Cardiothoracic Vascular Surgery) | Admitting: Thoracic Surgery (Cardiothoracic Vascular Surgery)

## 2011-05-12 ENCOUNTER — Ambulatory Visit (INDEPENDENT_AMBULATORY_CARE_PROVIDER_SITE_OTHER): Payer: Self-pay | Admitting: Physician Assistant

## 2011-05-12 VITALS — BP 195/84 | HR 72 | Resp 16 | Ht 67.5 in | Wt 202.0 lb

## 2011-05-12 DIAGNOSIS — J8410 Pulmonary fibrosis, unspecified: Secondary | ICD-10-CM

## 2011-05-12 DIAGNOSIS — J849 Interstitial pulmonary disease, unspecified: Secondary | ICD-10-CM

## 2011-05-12 DIAGNOSIS — J8409 Other alveolar and parieto-alveolar conditions: Secondary | ICD-10-CM

## 2011-05-12 DIAGNOSIS — Z09 Encounter for follow-up examination after completed treatment for conditions other than malignant neoplasm: Secondary | ICD-10-CM

## 2011-05-12 DIAGNOSIS — J984 Other disorders of lung: Secondary | ICD-10-CM | POA: Diagnosis not present

## 2011-05-12 NOTE — Progress Notes (Signed)
[address]            [place] [address]          [phone]     HPI: Patient returns for routine postoperative follow-up having undergone the left vats with lung biopsies on 04/22/2011 . The patient's postoperative course was uneventful, however she was discharged home on 2 L of supplemental oxygen .  Since hospital discharge the patient has progressed well. She has had no chest discomfort and is taking no pain medications. She denies any shortness of breath or cough. She has been off her oxygen for approximately 3 hours today and in our office her O2 sats were 93% on room air.  Current Outpatient Prescriptions  Medication Sig Dispense Refill  . amLODipine-olmesartan (AZOR) 5-40 MG per tablet Take 1 tablet by mouth daily.  30 tablet  5  . aspirin 81 MG tablet Take 81 mg by mouth daily.        Marland Kitchen BYSTOLIC 10 MG tablet TAKE ONE TABLET BY MOUTH EVERY DAY  30 each  11  . CALCIUM CITRATE PO Take 1,200 mg by mouth daily.        . cholecalciferol (VITAMIN D) 1000 UNITS tablet Take 3,000 Units by mouth daily.        . diazepam (VALIUM) 5 MG tablet Take 5 mg by mouth every 6 (six) hours as needed. For anxiety      . FeFum-FePo-FA-B Cmp-C-Zn-Mn-Cu (RE DUALVIT PLUS) 162-115.2-1 MG CAPS TAKE ONE CAPSULE BY MOUTH EVERY DAY  30 each  10  . fluticasone (FLONASE) 50 MCG/ACT nasal spray Place 2 sprays into the nose daily as needed. For stuffy nose.       Marland Kitchen glipiZIDE (GLUCOTROL) 10 MG tablet TAKE ONE TABLET BY MOUTH TWICE DAILY  180 tablet  3  . L-Methylfolate-B6-B12 (METANX) 3-35-2 MG TABS Take one tab by mouth daily  90 tablet  3  . levothyroxine (LEVOTHROID) 125 MCG tablet Take 1 tablet (125 mcg total) by mouth daily.  30 tablet  11  . metFORMIN (GLUCOPHAGE-XR) 500 MG 24 hr tablet Take 1,000 mg by mouth 2 (two) times daily. Two tabs two times a day (must equal 1000 mg two times daily)      . Multiple Vitamins-Minerals (CENTRUM ULTRA WOMENS) TABS Take by mouth  daily.        . predniSONE (DELTASONE) 5 MG tablet Take 5 mg by mouth every morning.      . pyridOXINE (VITAMIN B-6) 100 MG tablet Take 100 mg by mouth daily.        . rosuvastatin (CRESTOR) 5 MG tablet Take 5 mg by mouth daily.        Marland Kitchen scopolamine (TRANSDERM-SCOP) 1.5 MG Place 1 patch onto the skin every 3 (three) days as needed. For vertigo.       . traMADol (ULTRAM) 50 MG tablet Take 1-2 tablets (50-100 mg total) by mouth every 6 (six) hours as needed for pain.  30 tablet  0    Physical Exam: BP 198/84 HR 72 Resp 16 Wounds: Clean and dry, no erythema Heart: RRR Lungs: slightly diminished BS in bases, overall clear    Diagnostic Tests: Chest xray: Stable, no ptx  Assessment/Plan: The patient is doing well status post left open lung biopsy. Her pathology was positive for organizing pneumonia. Her pulmonary status has remained stable and currently she is maintaining  sats of greater than 90% on room air with no shortness of breath. We will discontinue her supplemental oxygen at this point. She has a followup appointment tomorrow with Dr. Annamaria Boots. We will see her back on an as needed basis.

## 2011-05-13 ENCOUNTER — Encounter: Payer: Self-pay | Admitting: Internal Medicine

## 2011-05-13 ENCOUNTER — Ambulatory Visit (INDEPENDENT_AMBULATORY_CARE_PROVIDER_SITE_OTHER): Payer: Medicare Other | Admitting: Internal Medicine

## 2011-05-13 ENCOUNTER — Ambulatory Visit: Payer: Medicare Other | Admitting: Family Medicine

## 2011-05-13 VITALS — BP 140/60 | HR 74 | Ht 67.5 in | Wt 202.0 lb

## 2011-05-13 DIAGNOSIS — J13 Pneumonia due to Streptococcus pneumoniae: Secondary | ICD-10-CM

## 2011-05-13 DIAGNOSIS — C7A029 Malignant carcinoid tumor of the large intestine, unspecified portion: Secondary | ICD-10-CM | POA: Diagnosis not present

## 2011-05-13 DIAGNOSIS — D499 Neoplasm of unspecified behavior of unspecified site: Secondary | ICD-10-CM

## 2011-05-13 DIAGNOSIS — J189 Pneumonia, unspecified organism: Secondary | ICD-10-CM

## 2011-05-13 DIAGNOSIS — J181 Lobar pneumonia, unspecified organism: Secondary | ICD-10-CM

## 2011-05-13 NOTE — Progress Notes (Signed)
04/03/11- 78 year old female former smoking history referred courtesy of Dr. Elease Hashimoto with concern of abnormal chest CT. Husband is an allergy patient here. Dr. Erick Blinks recent office note reviewed "Followup from recent pneumonia. Patient had presented here with extreme myalgias and chronic pain along with bilateral shoulder hip pain. Very high sedimentation rate of 120.  She almost total resolution of myalgias with low dose prednisone strongly suggesting polymyalgia rheumatica. She subsequently presented with cough with trace of blood but no dyspnea, fever, reported a pain. Chest x-ray showed bilateral infiltrates suggesting probable bilateral pneumonia the recommendation for CT scan. CT scan revealed no adenopathy and no evidence for a lung mass. Compatible with bilateral pneumonia. Patient placed on antibiotic and much better at this time with no fever and essentially no cough. Her muscle and joint pains are almost 100% resolved with low-dose prednisone. She did present back in September with severe sinus disease suggesting acute sinusitis. CT revealed no acute findings. She denies any arthralgias at this time. No skin rash. No fever." She has been on prednisone 10 mg twice daily since December 13 and feels significantly better. Morning cough has cleared with residual trace phlegm and trace blood. No chest pain. CT chest 03/23/2011 -images reviewed by me-bilateral patchy airspace disease with air bronchograms. No cavitation or definite nodularity seen. She has had right total knee replacement but no generalized arthritis. Childhood exposure to an uncle with tuberculosis. Her PPD skin test was negative. She denies history of lung disease, GERD, DVT, kidney or liver disease. Lab- C-ANCA POS 1:320, P-ANCA NEG. RA 1:57, ANA NEG30 year old female former smoking history referred courtesy of Dr. Elease Hashimoto with concern of abnormal chest CT. Husband is an allergy patient here.  05/13/11- 78 yoF former smoker  followed for bilateral pulmonary infiltrates, incr C-ANCA, s/p VATS bx/ organizing pneumonia, Metastatic intestinal carcinoid. Marland Kitchen Hx breast Ca 2006/ Dr Truddie Coco Husband here. She returns now after left lung biopsy. She has expected left thoracotomy pain but is otherwise stable as she continues maintenance prednisone. Denies blood, fever, swollen glands, discolored sputum. Exertional dyspnea is not worse, allowing for the surgery. I shared with her and her husband the available initial pathology: Patchy organizing pneumonia with chronic interstitial pneumonia and focal atypical glands. I reviewed the discussion about her at thoracic oncology conference. The parenchymal lung process is not a vasculitis as I originally questioned. I told her there was an incidental finding of cancer cells with further identification pending but that this was a separate process. We agreed to refer her to Dr. Truddie Coco who managed her breast cancer. After she had left, personal communication from Dr. Jimmy Footman- neuroendocrine tumor staining consistent with metastatic intestinal carcinoid.  ROS-see HPI Constitutional:   No-   weight loss, night sweats, fevers, chills, fatigue, lassitude. HEENT:   No-  headaches, difficulty swallowing, tooth/dental problems, sore throat,       No-  sneezing, itching, ear ache, nasal congestion, post nasal drip,  CV: +chest pain, orthopnea, PND, swelling in lower extremities, anasarca,      + chronic dizziness,   No-palpitations Resp: +   shortness of breath with exertion or at rest.              Minimal  productive cough,  No non-productive cough,  Trace + coughing up of blood after surgery.              No-   change in color of mucus.  No- wheezing.   Skin: No-   rash or lesions. GI:  No-   heartburn, indigestion, abdominal pain, nausea, vomiting, diarrhea,                 change in bowel habits, loss of appetite GU: MS:  No-   joint pain or swelling.  No- decreased range of motion.  No-  back pain. Neuro-     Vertigo Psych:  No- change in mood or affect. No depression or anxiety.  No memory loss.  OBJ General- Alert, Oriented, Affect-appropriate, Distress- none acute, overweight Skin- rash-none, lesions- none, excoriation- none Lymphadenopathy- none Head- atraumatic            Eyes- Gross vision intact, PERRLA, conjunctivae -pale            Ears- Hearing, canals-normal            Nose- turbinate edema, no-Septal dev, mucus, polyps, erosion, perforation             Throat- Mallampati II-III , mucosa clear , drainage- none, tonsils- atrophic Neck- flexible , trachea midline, no stridor , thyroid nl, carotid no bruit Chest - symmetrical excursion , unlabored           Heart/CV- RRR , 2-3/6 precordial systolic murmur , no gallop  , no rub, nl s1 s2                           - JVD- none , edema- none, stasis changes- none, varices- none           Lung- few crackles, wheeze- none, cough- none , dullness-none, rub- none           Chest wall- healing VATS thoracotomy incision L Abd-  Br/ Gen/ Rectal- Not done, not indicated Extrem- cyanosis- none, clubbing, none, atrophy- none, strength- nl. Wheelchair for distance/ cane at home. Limited mobility R shoulder -extension limited by pain. Neuro- grossly intact to observation

## 2011-05-13 NOTE — Patient Instructions (Addendum)
Continue prednisone for now  Order- PET scan- neck to thigh-  Dx neoplasia in lung biopsy, hx breast Cancer  Order- referral to current Dr Shanon Brow Rubin/ Oncologist - possible new cancer in lung biopsy  Order- PFT   Dx pneumonia

## 2011-05-14 ENCOUNTER — Telehealth: Payer: Self-pay | Admitting: *Deleted

## 2011-05-14 NOTE — Telephone Encounter (Signed)
patient's daughter confirmed over the phone the new date and time for mammogram at [facility] on 05-14-2011 at 2:00pm

## 2011-05-16 DIAGNOSIS — C7A029 Malignant carcinoid tumor of the large intestine, unspecified portion: Secondary | ICD-10-CM | POA: Insufficient documentation

## 2011-05-16 NOTE — Assessment & Plan Note (Signed)
"  BOOP/ COP"-  Treatment with steroids may help and is continued for now. No specific initial event was recognized. We will follow with CXR and PFT.

## 2011-05-16 NOTE — Assessment & Plan Note (Signed)
I have called Dr. Collier Salina Rubin/oncology. He is aware of the diagnosis and is calling her in to followup.

## 2011-05-19 ENCOUNTER — Ambulatory Visit (HOSPITAL_BASED_OUTPATIENT_CLINIC_OR_DEPARTMENT_OTHER): Payer: Medicare Other | Admitting: Oncology

## 2011-05-19 ENCOUNTER — Telehealth: Payer: Self-pay | Admitting: Family Medicine

## 2011-05-19 ENCOUNTER — Telehealth: Payer: Self-pay | Admitting: Oncology

## 2011-05-19 ENCOUNTER — Other Ambulatory Visit: Payer: Medicare Other | Admitting: Lab

## 2011-05-19 VITALS — BP 182/93 | HR 72 | Temp 98.0°F | Ht 67.5 in | Wt 195.2 lb

## 2011-05-19 DIAGNOSIS — D3A00 Benign carcinoid tumor of unspecified site: Secondary | ICD-10-CM | POA: Diagnosis not present

## 2011-05-19 DIAGNOSIS — C50919 Malignant neoplasm of unspecified site of unspecified female breast: Secondary | ICD-10-CM

## 2011-05-19 LAB — COMPREHENSIVE METABOLIC PANEL
ALT: 11 U/L (ref 0–35)
CO2: 28 mEq/L (ref 19–32)
Calcium: 9.4 mg/dL (ref 8.4–10.5)
Chloride: 97 mEq/L (ref 96–112)
Creatinine, Ser: 0.94 mg/dL (ref 0.50–1.10)
Glucose, Bld: 172 mg/dL — ABNORMAL HIGH (ref 70–99)
Sodium: 135 mEq/L (ref 135–145)
Total Bilirubin: 0.3 mg/dL (ref 0.3–1.2)
Total Protein: 7.2 g/dL (ref 6.0–8.3)

## 2011-05-19 LAB — CBC WITH DIFFERENTIAL/PLATELET
BASO%: 0.6 % (ref 0.0–2.0)
Eosinophils Absolute: 0.4 10*3/uL (ref 0.0–0.5)
HCT: 30 % — ABNORMAL LOW (ref 34.8–46.6)
LYMPH%: 25.6 % (ref 14.0–49.7)
MONO#: 1 10*3/uL — ABNORMAL HIGH (ref 0.1–0.9)
NEUT#: 6.1 10*3/uL (ref 1.5–6.5)
NEUT%: 60 % (ref 38.4–76.8)
Platelets: 423 10*3/uL — ABNORMAL HIGH (ref 145–400)
WBC: 10.1 10*3/uL (ref 3.9–10.3)
lymph#: 2.6 10*3/uL (ref 0.9–3.3)

## 2011-05-19 LAB — LACTATE DEHYDROGENASE: LDH: 157 U/L (ref 94–250)

## 2011-05-19 NOTE — Telephone Encounter (Signed)
gve the pt her feb 2013 appt calendar along with the added ct scan of the chest/ scheduled [facility] mammo[facility]t [facility]

## 2011-05-19 NOTE — Telephone Encounter (Signed)
Spoke with pt- reviewed list of meds to stop given to her for CT to be done. Instructed to check sugars and if running high or low - please call for instructions how to handle. KIK

## 2011-05-19 NOTE — Telephone Encounter (Signed)
Patient needs a nurse call back to see if any of her meds have ingredients that would interfere with her scans. Please call patient at [phone].

## 2011-05-19 NOTE — Telephone Encounter (Signed)
Please advise

## 2011-05-19 NOTE — Progress Notes (Signed)
Hematology and Oncology Follow Up Visit  MARLENI GALLARDO [id number] 25-Jun-1932 78 y.o. 05/19/2011 12:38 PM PCP Dr Glynn Octave Dr B Burchette Principle Diagnosis: History of breast cancer, currently being evaluated for possible neuroendocrine carcinoma involving lung.  Interim History:  Since being seen last Ms. Pell was evaluated by her primary care doctor for possible pneumonia. She was placed on oral antibiotics. She had a chest x-ray which suggested bilateral involvement of her lungs. She was subsequently seen by a pulmonologist who in turn was concerned about Wegener's disease. She she says he referred her for possible lung biopsy. This was performed by Dr. Roxan Hockey on 04/22/2011. She has recovered reasonably well from this. She did require 5 day hospital stay as well some home O2. Currently she is off her antibiotics and feels well. She is no longer taking home O2. She is taking low-dose prednisone. Results are detailed below but essentially would appear that she does have evidence of atypical glandular cells in her pathology. Final staining suggested this is of neuroendocrine type likely of GI origin. She really does not exhibit any symptoms related to having a neuroendocrine or carcinoid type cancer.  Medications: I have reviewed the patient's current medications.  Allergies:  Allergies  Allergen Reactions  . Amoxicillin     GI upset, pt says lower doses are ok.  . Codeine Sulfate     REACTION: GI upset  . Sulfonamide Derivatives     REACTION: GI upset    Past Medical History, Surgical history, Social history, and Family History were reviewed and updated.  Review of Systems: Constitutional:  Negative for fever, chills, night sweats, anorexia, weight loss, pain. Cardiovascular: no chest pain or dyspnea on exertion Respiratory: no cough, shortness of breath, or wheezing Neurological: negative Dermatological: negative ENT: negative Skin Gastrointestinal: negative Genito-Urinary:  negative Hematological and Lymphatic: negative Breast: negative Musculoskeletal: negative Remaining ROS negative.  Physical Exam: Blood pressure 182/93, pulse 72, temperature 98 F (36.7 C), temperature source Oral, height 5' 7.5" (1.715 m), weight 195 lb 3.2 oz (88.542 kg). ECOG:  General appearance: alert, cooperative and appears stated age Head: Normocephalic, without obvious abnormality, atraumatic Neck: no adenopathy, no carotid bruit, no JVD, supple, symmetrical, trachea midline and thyroid not enlarged, symmetric, no tenderness/mass/nodules Lymph nodes: Cervical, supraclavicular, and axillary nodes normal. Cardiac : regular rate and rhythm, no murmurs or gallops Pulmonary:clear to auscultation bilaterally and normal percussion bilaterally Breasts: inspection negative, no nipple discharge or bleeding, no masses or nodularity palpable Abdomen:normal findings: bowel sounds normal Extremities negative Neuro: alert, oriented, normal speech, no focal findings or movement disorder noted  Lab Results: Lab Results  Component Value Date   WBC 10.1 05/19/2011   HGB 10.1* 05/19/2011   HCT 30.0* 05/19/2011   MCV 88.9 05/19/2011   PLT 423* 05/19/2011     Chemistry      Component Value Date/Time   NA 124* 04/24/2011 0405   K 4.0 04/24/2011 0405   CL 88* 04/24/2011 0405   CO2 26 04/24/2011 0405   BUN 15 04/24/2011 0405   CREATININE 0.92 04/24/2011 0405      Component Value Date/Time   CALCIUM 8.8 04/24/2011 0405   ALKPHOS 60 04/18/2011 1351   AST 21 04/18/2011 1351   ALT 14 04/18/2011 1351   BILITOT 0.4 04/18/2011 1351     REASON: [id number]: Further evaluation of the "focal atypical glands" reported by the outside consultant pathologist. FINAL DIAGNOSIS Diagnosis 1. Lung, wedge biopsy/resection, left upper lobe - PATCHY ORGANIZING PNEUMONIA WITH CHRONIC  INTERSTITIAL PNEUMONIA, HEMOSIDERIN DEPOSITS SUGGESTIVE OF CHRONIC CONGESTION AND FOCAL ATYPICAL GLANDS. SEE COMMENT. 2. Lung,  wedge biopsy/resection, left upper lobe - PATCHY ORGANIZING PNEUMONIA WITH CHRONIC INTERSTITIAL PNEUMONIA, HEMOSIDERIN DEPOSITS SUGGESTIVE OF CHRONIC CONGESTION AND FOCAL ATYPICAL GLANDS. SEE COMMENT. Microscopic Comment 1. Please see the [facility] pathology report for the entire interpretation ([id number]). ADDENDUM: The atypical glands identified represent well differentiated low-grade neuroendocrine tumor. The neuroendocrine tumor has the following immunophenotype. Cytokeratin AE1/AE3 - diffuse, strong positivity CDX2 - diffuse, strong positivity TTF1 - negative expression GCDFP - negative expression Estrogen receptor - negative expression Synaptophysin - diffuse, strong expression Overall, the immunophenotype is diagnostic of neuroendocrine tumor. The lack of TTF1 expression and strong CDX2 expression are immunophenotypically consistent with metastatic neuroendocrine tumor of gastrointestinal origin. GMS stain is negative for yeast and fungi. Correlation with radiographic imaging is strongly recommended. The case was discussed with Drs. Doristine Section, and [place] on 05/13/2011. Case was reviewed by Dr. Lyndon Code who essentially agrees. (CR:jy) 05/14/11 1 of 2 Amended copy Addendum FINAL for [place], [place] B ([id number]) Microscopic Comment(continued) Mal[place] RUND DO Pathologist, Electronic Signature   (Case signed .Marland Kitchen Radiological Studies: chest X-ray n/a Mammogram n/a Bone density n/a  Impression and Plan:  This is a pleasant 78 year old woman with a history of breast cancer now presenting with what sounds like a neuroendocrine or carcinoid carcinoma and involvement normals likely a GI origin. I discussed the natural history of this type of cancer. I also indicated that this was not a obvious finding and required some other input before it was apparent and so I tried to reassure her that this was likely an incidental finding. She should have evidence of pneumonia  on her biopsy as well. My plan is to go ahead with some staging scans she really has a PET scan ordered for this week at, and I have added a CAT scan of the chest as well as was done 2 months ago truly look at and see if there is any changes in the infiltrates of her lungs. I've also ordered a 24-hour urine collection for 5 HIAA. I will plan to see her again in followup in 2-3 weeks to review results with her. If this is  obvious and there are new changes  we discussed that  treatment , might be indicated. although I think that given her age and other comorbid problems and maybe inclined to do a watchful waiting type approach as well.  More than 50% of the visit was spent in patient-related counselling   Eston Esters, MD 2/11/201312:38 PM

## 2011-05-20 ENCOUNTER — Ambulatory Visit: Payer: Medicare Other | Admitting: Oncology

## 2011-05-21 ENCOUNTER — Encounter (HOSPITAL_COMMUNITY)
Admission: RE | Admit: 2011-05-21 | Discharge: 2011-05-21 | Disposition: A | Discharge status: Home / Self Care | Payer: Medicare Other | Source: Ambulatory Visit | Attending: Internal Medicine | Admitting: Internal Medicine

## 2011-05-21 ENCOUNTER — Encounter (HOSPITAL_COMMUNITY): Payer: Self-pay

## 2011-05-21 ENCOUNTER — Ambulatory Visit (HOSPITAL_COMMUNITY)
Admission: RE | Admit: 2011-05-21 | Discharge: 2011-05-21 | Disposition: A | Discharge status: Home / Self Care | Payer: Medicare Other | Source: Ambulatory Visit | Attending: Oncology | Admitting: Oncology

## 2011-05-21 DIAGNOSIS — D499 Neoplasm of unspecified behavior of unspecified site: Secondary | ICD-10-CM

## 2011-05-21 DIAGNOSIS — J90 Pleural effusion, not elsewhere classified: Secondary | ICD-10-CM | POA: Diagnosis not present

## 2011-05-21 DIAGNOSIS — J189 Pneumonia, unspecified organism: Secondary | ICD-10-CM | POA: Diagnosis not present

## 2011-05-21 DIAGNOSIS — J984 Other disorders of lung: Secondary | ICD-10-CM | POA: Diagnosis not present

## 2011-05-21 DIAGNOSIS — C3490 Malignant neoplasm of unspecified part of unspecified bronchus or lung: Secondary | ICD-10-CM | POA: Diagnosis not present

## 2011-05-21 DIAGNOSIS — D3A00 Benign carcinoid tumor of unspecified site: Secondary | ICD-10-CM

## 2011-05-21 MED ORDER — FLUDEOXYGLUCOSE F - 18 (FDG) INJECTION
18.9000 | Freq: Once | INTRAVENOUS | Status: AC | PRN
  Administered 2011-05-21: 18.9 via INTRAVENOUS

## 2011-05-21 MED ORDER — IOHEXOL 300 MG/ML  SOLN
80.0000 mL | Freq: Once | INTRAMUSCULAR | Status: AC | PRN
  Administered 2011-05-21: 80 mL via INTRAVENOUS

## 2011-05-22 ENCOUNTER — Telehealth: Payer: Self-pay | Admitting: Family Medicine

## 2011-05-22 NOTE — Telephone Encounter (Signed)
Patient states she had a pet scan and an mri and she received a letter stating that she can not take her metformin and to contact her doctor's office to have kidney test. Patient would like a call back so that she may explain in detail. Please assist.

## 2011-05-22 NOTE — Telephone Encounter (Signed)
I spoke with pt, she was to stop taking Metformin 72 hours prior to procedure and do not start taking it again for 48 hours post procedure.  They told her she could start back on Metformin late Friday afternoon, but first check with her doctor.  Procedure was yesterday.  Also she wanted you to know Dr Truddie Coco did a 24 hour urine

## 2011-05-22 NOTE — Telephone Encounter (Signed)
Pt informed

## 2011-05-22 NOTE — Telephone Encounter (Signed)
Follow their instruction for the metformin.

## 2011-05-23 ENCOUNTER — Telehealth: Payer: Self-pay | Admitting: *Deleted

## 2011-05-23 NOTE — Progress Notes (Signed)
Quick Note:  Called, spoke with pt. I informed her of PET scan results per CDY. She verbalized understanding of this. ______

## 2011-05-23 NOTE — Telephone Encounter (Signed)
The pneumonia is slowly responding to the prednisone.  I would like to see her back in about 1 month about that.

## 2011-05-23 NOTE — Telephone Encounter (Signed)
Called and spoke with pt. Informed her of CY's response/recs.  Pt verbalized understanding and scheduled 1 month f/u appt with CY for 06-20-11 at 10:45 am

## 2011-05-23 NOTE — Telephone Encounter (Signed)
[facility] Allergies verified  Allergies  Allergen Reactions  . Amoxicillin     GI upset, pt says lower doses are ok.  . Codeine Sulfate     REACTION: GI upset  . Sulfonamide Derivatives     REACTION: GI upset     Also, would like to know if she needs to keep appt with CDY on Tuesday.

## 2011-05-24 LAB — 5 HIAA, QUANTITATIVE, URINE, 24 HOUR: 5-HIAA, 24 Hr Urine: 3 mg/24 h (ref <=6.0)

## 2011-05-27 LAB — FUNGUS CULTURE W SMEAR: Fungal Smear: NONE SEEN

## 2011-05-30 ENCOUNTER — Other Ambulatory Visit: Payer: Self-pay | Admitting: Family Medicine

## 2011-06-02 NOTE — Telephone Encounter (Signed)
Pt has enough pills for 1 day only.

## 2011-06-03 ENCOUNTER — Ambulatory Visit: Payer: Medicare Other | Admitting: Physician Assistant

## 2011-06-08 LAB — AFB CULTURE WITH SMEAR (NOT AT ARMC): Acid Fast Smear: NONE SEEN

## 2011-06-10 ENCOUNTER — Ambulatory Visit: Payer: Medicare Other | Admitting: Internal Medicine

## 2011-06-10 ENCOUNTER — Ambulatory Visit (INDEPENDENT_AMBULATORY_CARE_PROVIDER_SITE_OTHER): Payer: Medicare Other | Admitting: Internal Medicine

## 2011-06-10 DIAGNOSIS — R0602 Shortness of breath: Secondary | ICD-10-CM | POA: Diagnosis not present

## 2011-06-10 LAB — PULMONARY FUNCTION TEST

## 2011-06-10 NOTE — Progress Notes (Signed)
PFT done today. 

## 2011-06-11 ENCOUNTER — Other Ambulatory Visit: Payer: Medicare Other

## 2011-06-11 ENCOUNTER — Other Ambulatory Visit: Payer: Self-pay

## 2011-06-11 DIAGNOSIS — D3A00 Benign carcinoid tumor of unspecified site: Secondary | ICD-10-CM

## 2011-06-12 ENCOUNTER — Other Ambulatory Visit (HOSPITAL_BASED_OUTPATIENT_CLINIC_OR_DEPARTMENT_OTHER): Payer: Medicare Other

## 2011-06-12 ENCOUNTER — Telehealth: Payer: Self-pay | Admitting: *Deleted

## 2011-06-12 ENCOUNTER — Encounter: Payer: Self-pay | Admitting: Physician Assistant

## 2011-06-12 ENCOUNTER — Ambulatory Visit (HOSPITAL_BASED_OUTPATIENT_CLINIC_OR_DEPARTMENT_OTHER): Payer: Medicare Other | Admitting: Physician Assistant

## 2011-06-12 DIAGNOSIS — D649 Anemia, unspecified: Secondary | ICD-10-CM | POA: Diagnosis not present

## 2011-06-12 DIAGNOSIS — C50919 Malignant neoplasm of unspecified site of unspecified female breast: Secondary | ICD-10-CM

## 2011-06-12 DIAGNOSIS — D3A00 Benign carcinoid tumor of unspecified site: Secondary | ICD-10-CM

## 2011-06-12 LAB — CBC WITH DIFFERENTIAL/PLATELET
BASO%: 0.5 % (ref 0.0–2.0)
EOS%: 3.1 % (ref 0.0–7.0)
HCT: 32.3 % — ABNORMAL LOW (ref 34.8–46.6)
LYMPH%: 37.6 % (ref 14.0–49.7)
MCH: 29.6 pg (ref 25.1–34.0)
MCHC: 33.1 g/dL (ref 31.5–36.0)
MCV: 89.5 fL (ref 79.5–101.0)
MONO%: 10 % (ref 0.0–14.0)
NEUT%: 48.8 % (ref 38.4–76.8)
Platelets: 354 10*3/uL (ref 145–400)
RBC: 3.61 10*6/uL — ABNORMAL LOW (ref 3.70–5.45)
WBC: 8.6 10*3/uL (ref 3.9–10.3)

## 2011-06-12 LAB — COMPREHENSIVE METABOLIC PANEL
ALT: 14 U/L (ref 0–35)
AST: 18 U/L (ref 0–37)
Alkaline Phosphatase: 48 U/L (ref 39–117)
CO2: 28 mEq/L (ref 19–32)
Creatinine, Ser: 0.93 mg/dL (ref 0.50–1.10)
Sodium: 135 mEq/L (ref 135–145)
Total Bilirubin: 0.3 mg/dL (ref 0.3–1.2)
Total Protein: 7.2 g/dL (ref 6.0–8.3)

## 2011-06-12 NOTE — Progress Notes (Signed)
Hematology and Oncology Follow Up Visit  [name] [name] [id number] [birth year] 78 y.o. 06/12/2011    HPI: [name] [name] is a 78 year old [place] woman with a remote history of left breast carcinoma, recently diagnosed with possible neuroendocrine carcinoma involving the lung.  Interim History:   [name] [name] is seen today with her husband in accompaniment to go over the most recent PET and CT of the chest scan which was obtained due to her recent lung biopsy performed by Dr. Roxan Hockey on 04/22/2011 revealing evidence of possible neuroendocrine type of malignancy felt to represent a GI origin. She voices no complaints today, no cough, no shortness of breath or chest pain. She does admit that her left breast lumpectomy site is a bit sore, but she feels is secondary to undergoing the surgical procedure. No fevers or chills. Energy level is good. She states that she had a "breathing test" performed yesterday.  Medications:   I have reviewed the patient's current medications.  Current Outpatient Prescriptions  Medication Sig Dispense Refill  . amLODipine-olmesartan (AZOR) 5-40 MG per tablet Take 1 tablet by mouth daily.  30 tablet  5  . aspirin 81 MG tablet Take 81 mg by mouth daily.        Marland Kitchen BYSTOLIC 10 MG tablet TAKE ONE TABLET BY MOUTH EVERY DAY  30 each  11  . CALCIUM CITRATE PO Take 1,200 mg by mouth daily.        . cholecalciferol (VITAMIN D) 1000 UNITS tablet Take 3,000 Units by mouth daily.        . diazepam (VALIUM) 5 MG tablet Take 5 mg by mouth every 6 (six) hours as needed. For anxiety      . FeFum-FePo-FA-B Cmp-C-Zn-Mn-Cu (RE DUALVIT PLUS) 162-115.2-1 MG CAPS TAKE ONE CAPSULE BY MOUTH EVERY DAY  30 each  10  . fluticasone (FLONASE) 50 MCG/ACT nasal spray Place 2 sprays into the nose daily as needed. For stuffy nose.       Marland Kitchen glipiZIDE (GLUCOTROL) 10 MG tablet TAKE ONE TABLET BY MOUTH TWICE DAILY  180 tablet  3  . L-Methylfolate-B6-B12 (METANX) 3-35-2 MG TABS Take one tab  by mouth daily  90 tablet  3  . levothyroxine (LEVOTHROID) 125 MCG tablet Take 1 tablet (125 mcg total) by mouth daily.  30 tablet  11  . metFORMIN (GLUCOPHAGE-XR) 500 MG 24 hr tablet TAKE TWO TABLETS BY MOUTH TWICE DAILY (TOTAL 1000MG TWO TIMES A DAY)  120 tablet  0  . Multiple Vitamins-Minerals (CENTRUM ULTRA WOMENS) TABS Take by mouth daily.        . predniSONE (DELTASONE) 5 MG tablet Take 5 mg by mouth every morning.      . pyridOXINE (VITAMIN B-6) 100 MG tablet Take 100 mg by mouth daily.        . rosuvastatin (CRESTOR) 5 MG tablet Take 5 mg by mouth daily.        Marland Kitchen scopolamine (TRANSDERM-SCOP) 1.5 MG Place 1 patch onto the skin every 3 (three) days as needed. For vertigo.       . traMADol (ULTRAM) 50 MG tablet         Allergies:  Allergies  Allergen Reactions  . Amoxicillin     GI upset, pt says lower doses are ok.  . Codeine Sulfate     REACTION: GI upset  . Sulfonamide Derivatives     REACTION: GI upset    Physical Exam: Filed Vitals:   06/12/11 1321  BP: 194/92  Pulse:  65  Temp: 97.9 F (36.6 C)    Body mass index is 30.54 kg/(m^2). Weight: 197 lbs. Lungs:  Clear to auscultation bilaterally.  No crackles, rhonchi, or wheezes.   Heart:  Regular rate and rhythm.    Lab Results: Lab Results  Component Value Date   WBC 8.6 06/12/2011   HGB 10.7* 06/12/2011   HCT 32.3* 06/12/2011   MCV 89.5 06/12/2011   PLT 354 06/12/2011   NEUTROABS 4.2 06/12/2011     Chemistry      Component Value Date/Time   NA 135 05/19/2011 0934   K 4.0 05/19/2011 0934   CL 97 05/19/2011 0934   CO2 28 05/19/2011 0934   BUN 13 05/19/2011 0934   CREATININE 0.94 05/19/2011 0934      Component Value Date/Time   CALCIUM 9.4 05/19/2011 0934   ALKPHOS 56 05/19/2011 0934   AST 13 05/19/2011 0934   ALT 11 05/19/2011 0934   BILITOT 0.3 05/19/2011 0934      Lab Results  Component Value Date   LABCA2 19 09/17/2010   LABCA2 19 09/17/2010   LABCA2 19 09/17/2010    Radiological Studies: Ct Chest W  Contrast 05/21/2011  *RADIOLOGY REPORT*  Clinical Data: Follow-up pneumonia, neoplasm.  .  CT CHEST WITH CONTRAST  Technique:  Multidetector CT imaging of the chest was performed following the standard protocol during bolus administration of intravenous contrast.  Contrast: 74m OMNIPAQUE IOHEXOL 300 MG/ML IV SOLN  Comparison: CT scan 03/24/2011, PET CT scan to 12/19/2011  Findings: No axillary or supraclavicular lymphadenopathy.  There is a fluid collection along the left lateral chest wall measuring 3.9 x 2.5 cm likely related to recent VATS.  Another focus of inflammation within the posterior left chest wall likely related to a port.  There is no mediastinal lymphadenopathy.  There is volume loss in the left hemithorax consistent with partial lobectomy. There is surgical clips along the pleural margin.  There is bronchiectasis at the lung bases.  There is no discrete pulmonary nodules.   The air space disease seen on comparison exam has near completely resolved. In the right lung there is some peripheral reticular branching pattern which likely numbered represents the residua of the previous air space disease which is also near completely resolved in the right lung.  Limited view upper abdomen demonstrates normal adrenal glands. There is no focal hepatic lesion evident.  The kidneys appear normal.  Limited view the skeleton is unremarkable  IMPRESSION:  1.  Marked improvement in bilateral air space disease seen on comparison CT of 03/24/2011. Near complete resolution.  2.  Postoperative change in the left hemithorax consistent with recent VATS.  Original Report Authenticated By: SSuzy Bouchard M.D.   Nm Pet Image Initial (pi) Skull Base To Thigh 05/21/2011  *RADIOLOGY REPORT*  Clinical Data: Initial treatment strategy for lung cancer. Additional history of breast cancer.  NUCLEAR MEDICINE PET SKULL BASE TO THIGH  Fasting Blood Glucose:  220  Technique:  18.9 mCi F-18 FDG was injected intravenously.   CT data  was obtained and used for attenuation correction and anatomic localization only.  (This was not acquired as a diagnostic CT examination.) Additional exam technical data entered  on technologist worksheet.  Comparison: The CT 03/24/2011 of  Findings:  Neck: No hypermetabolic nodes in the neck.  Chest: There is uptake along the left lateral chest wall likely related to recent surgery.  This includes a fluid collection within the left anterior chest wall (image 82) and a  focus of posterior uptake within the musculature (image 85).  Additionally there is uptake within the pleural space adjacent to the left ventricle where there is a surgical line (image 84).  There are no discrete foci of hypermetabolic activity within the lung itself.  No hypermetabolic mediastinal lymph nodes.  Bilateral pulmonary air space opacities have improved in the interval.  Abdomen / Pelvis:No abnormal uptake within the solid organs. Adrenal glands are normal.  No hypermetabolic lymph nodes the abdomen pelvis.  Skeleton:No focal hypermetabolic activity to suggest skeletal metastasis.  IMPRESSION:  1.  No clear evidence of malignancy. 2.  Uptake along the left lateral chest wall is likely related to surgery. 3.  Interval improvement air space disease within the lungs. 4.  No evidence of malignancy within the abdomen or pelvis.  Original Report Authenticated By: Suzy Bouchard, M.D.     Assessment:  Mrs. Kirn is a 78 year old [place] woman with a remote history of left breast carcinoma, recently diagnosed with possible neuroendocrine carcinoma involving the lung. Recent PET and CT of the chest failing to reveal any significant evidence of malignancy.  Case has been reviewed with Dr. Eston Esters who also spoken with Mrs. Heacock and her husband.   Plan:  At this time, Mrs. Nikolic has been reassured. We will plan on seeing her back in 3 months which correlates with her already scheduled appointment with Dr. Truddie Coco  pertaining to her history of breast cancer. We will obtain a CT of the chest prior to this followup appointment, and have a compared to the images from February 2013. Patient and her husband agree with this plan, but they know we would be happy to see her prior to her three-month followup if any should arise.  This plan was reviewed with the patient, who voices understanding and agreement.  She knows to call with any changes or problems.    Verona Hartshorn T, PA-C 06/12/2011

## 2011-06-12 NOTE — Telephone Encounter (Signed)
gave patient appointment for 09-2011 made patient appointment for ct chest with contrast printed out calendar and gave to the patient

## 2011-06-16 ENCOUNTER — Encounter: Payer: Self-pay | Admitting: Internal Medicine

## 2011-06-17 ENCOUNTER — Ambulatory Visit
Admission: RE | Admit: 2011-06-17 | Discharge: 2011-06-17 | Disposition: A | Discharge status: Home / Self Care | Payer: Medicare Other | Source: Ambulatory Visit | Attending: Oncology | Admitting: Oncology

## 2011-06-17 DIAGNOSIS — D0590 Unspecified type of carcinoma in situ of unspecified breast: Secondary | ICD-10-CM | POA: Diagnosis not present

## 2011-06-17 DIAGNOSIS — C50919 Malignant neoplasm of unspecified site of unspecified female breast: Secondary | ICD-10-CM

## 2011-06-20 ENCOUNTER — Ambulatory Visit (INDEPENDENT_AMBULATORY_CARE_PROVIDER_SITE_OTHER): Payer: Medicare Other | Admitting: Internal Medicine

## 2011-06-20 ENCOUNTER — Encounter: Payer: Self-pay | Admitting: Internal Medicine

## 2011-06-20 ENCOUNTER — Ambulatory Visit (INDEPENDENT_AMBULATORY_CARE_PROVIDER_SITE_OTHER)
Admission: RE | Admit: 2011-06-20 | Discharge: 2011-06-20 | Disposition: A | Discharge status: Home / Self Care | Payer: Medicare Other | Source: Ambulatory Visit | Attending: Internal Medicine | Admitting: Internal Medicine

## 2011-06-20 DIAGNOSIS — J84116 Cryptogenic organizing pneumonia: Secondary | ICD-10-CM

## 2011-06-20 DIAGNOSIS — J984 Other disorders of lung: Secondary | ICD-10-CM | POA: Diagnosis not present

## 2011-06-20 DIAGNOSIS — J8409 Other alveolar and parieto-alveolar conditions: Secondary | ICD-10-CM

## 2011-06-20 DIAGNOSIS — C7A029 Malignant carcinoid tumor of the large intestine, unspecified portion: Secondary | ICD-10-CM | POA: Diagnosis not present

## 2011-06-20 DIAGNOSIS — J8489 Other specified interstitial pulmonary diseases: Secondary | ICD-10-CM

## 2011-06-20 DIAGNOSIS — J189 Pneumonia, unspecified organism: Secondary | ICD-10-CM | POA: Diagnosis not present

## 2011-06-20 NOTE — Progress Notes (Signed)
04/03/11- 78 year old female former smoking history referred courtesy of Dr. Elease Hashimoto with concern of abnormal chest CT. Husband is an allergy patient here. Dr. Erick Blinks recent office note reviewed "Followup from recent pneumonia. Patient had presented here with extreme myalgias and chronic pain along with bilateral shoulder hip pain. Very high sedimentation rate of 120.  She almost total resolution of myalgias with low dose prednisone strongly suggesting polymyalgia rheumatica. She subsequently presented with cough with trace of blood but no dyspnea, fever, reported a pain. Chest x-ray showed bilateral infiltrates suggesting probable bilateral pneumonia the recommendation for CT scan. CT scan revealed no adenopathy and no evidence for a lung mass. Compatible with bilateral pneumonia. Patient placed on antibiotic and much better at this time with no fever and essentially no cough. Her muscle and joint pains are almost 100% resolved with low-dose prednisone. She did present back in September with severe sinus disease suggesting acute sinusitis. CT revealed no acute findings. She denies any arthralgias at this time. No skin rash. No fever." She has been on prednisone 10 mg twice daily since December 13 and feels significantly better. Morning cough has cleared with residual trace phlegm and trace blood. No chest pain. CT chest 03/23/2011 -images reviewed by me-bilateral patchy airspace disease with air bronchograms. No cavitation or definite nodularity seen. She has had right total knee replacement but no generalized arthritis. Childhood exposure to an uncle with tuberculosis. Her PPD skin test was negative. She denies history of lung disease, GERD, DVT, kidney or liver disease. Lab- C-ANCA POS 1:320, P-ANCA NEG. RA 1:63, ANA NEG74 year old female former smoking history referred courtesy of Dr. Elease Hashimoto with concern of abnormal chest CT. Husband is an allergy patient here.  05/13/11- 89 yoF former smoker  followed for bilateral pulmonary infiltrates, incr C-ANCA, s/p VATS bx/ organizing pneumonia, Metastatic intestinal carcinoid. Marland Kitchen Hx breast Ca 2006/ Dr Truddie Coco Husband here. She returns now after left lung biopsy. She has expected left thoracotomy pain but is otherwise stable as she continues maintenance prednisone. Denies blood, fever, swollen glands, discolored sputum. Exertional dyspnea is not worse, allowing for the surgery. I shared with her and her husband the available initial pathology: Patchy organizing pneumonia with chronic interstitial pneumonia and focal atypical glands. I reviewed the discussion about her at thoracic oncology conference. The parenchymal lung process is not a vasculitis as I originally questioned. I told her there was an incidental finding of cancer cells with further identification pending but that this was a separate process. We agreed to refer her to Dr. Truddie Coco who managed her breast cancer. After she had left, personal communication from Dr. Jimmy Footman- neuroendocrine tumor staining consistent with metastatic intestinal carcinoid.  [birth year][age] yoF former smoker followed for bilateral pulmonary infiltrates, incr C-ANCA, s/p VATS bx/ organizing pneumonia, Metastatic intestinal carcinoid. Marland Kitchen Hx breast Ca 2006/ Dr Truddie Coco   Husband here She says her breathing is now "fine" back to her normal. She denies fever, cough, sputum. I spoke with Mrs. Knepp myself and had to explain the discovery of carcinoid after her lung biopsy. We also have the note from the [facility] indicating they discussed it with her. She has absolutely no recollection. Little cough or shortness of breath now. Still a little posterior thoracotomy incision pain. CT 05/19/11- images reviewed with them- IMPRESSION:  1. Marked improvement in bilateral air space disease seen on  comparison CT of 03/24/2011. Near complete resolution.  2. Postoperative change in the left hemithorax consistent with  recent VATS.    Original Report Authenticated  By: Suzy Bouchard, M.D.   ROS-see HPI Constitutional:   No-   weight loss, night sweats, fevers, chills, fatigue, lassitude. HEENT:   No-  headaches, difficulty swallowing, tooth/dental problems, sore throat,       No-  sneezing, itching, ear ache, nasal congestion, post nasal drip,  CV: +chest pain, orthopnea, PND, swelling in lower extremities, anasarca,      + chronic dizziness,   No-palpitations Resp: +   shortness of breath with exertion or at rest.              Minimal  productive cough,  No non-productive cough,  Trace + coughing up of blood after surgery.              No-   change in color of mucus.  No- wheezing.   Skin: No-   rash or lesions. GI:  No-   heartburn, indigestion, abdominal pain, nausea, vomiting, diarrhea,                 change in bowel habits, loss of appetite GU: MS:  No-   joint pain or swelling.  No- decreased range of motion.  No- back pain. Neuro-     Vertigo Psych:  No- change in mood or affect. No depression or anxiety.  No memory loss.  OBJ General- Alert, Oriented, Affect-appropriate, Distress- none acute, overweight Skin- rash-none, lesions- none, excoriation- none Lymphadenopathy- none Head- atraumatic            Eyes- Gross vision intact, PERRLA, conjunctivae -pale            Ears- Hearing, canals-normal            Nose- turbinate edema, no-Septal dev, mucus, polyps, erosion, perforation             Throat- Mallampati II-III , mucosa clear , drainage- none, tonsils- atrophic Neck- flexible , trachea midline, no stridor , thyroid nl, carotid no bruit Chest - symmetrical excursion , unlabored           Heart/CV- RRR , 2-3/6 precordial systolic murmur , no gallop  , no rub, nl s1 s2                           - JVD- none , edema- none, stasis changes- none, varices- none           Lung- few crackles, wheeze- none, cough- none , dullness-none, rub- none           Chest wall- healing VATS thoracotomy incision L Abd-   Br/ Gen/ Rectal- Not done, not indicated Extrem- cyanosis- none, clubbing, none, atrophy- none, strength- nl. Wheelchair for distance/ cane at home. Limited mobility R shoulder -extension limited by pain. Neuro- grossly intact to observation

## 2011-06-20 NOTE — Patient Instructions (Addendum)
Order- CXR  Dx BOOP/COP/ cryptogenic organizing pneumonia We will call you with the result of the CXR and advise then what to do about your prednisone dose. -> reduced to 5 mg every other day. Anticipate stopping if CT in June remains clear.

## 2011-06-23 ENCOUNTER — Encounter: Payer: Self-pay | Admitting: Internal Medicine

## 2011-06-24 ENCOUNTER — Telehealth: Payer: Self-pay | Admitting: Internal Medicine

## 2011-06-24 MED ORDER — PREDNISONE 5 MG PO TABS: 5.0000 mg | ORAL_TABLET | ORAL | Status: DC

## 2011-06-24 NOTE — Telephone Encounter (Signed)
Notes Recorded by Deneise Lever, MD on 06/20/2011 at 2:22 PM CXR- chronic scarring. No active process is seen and this looks like the baseline for her. The pneumonia pattern seen originally has cleared.   Recommend- Reduce prednisone to 5 mg EVERY OTHER DAY for the month of April. If breathing stays good, then stop prednisone. Otherwise please call   I spoke with patient about results and she verbalized understanding and had no questions

## 2011-06-24 NOTE — Assessment & Plan Note (Addendum)
The organizing pneumonia confirmed by open biopsy is now markedly improved. We discussed how to begin steroid weaning. Plan-chest x-ray.. Reduce prednisone to 5 mg every other day. Followup chest CT is scheduled in June by Dr. Truddie Coco. If that remains clear then prednisone can be stopped.

## 2011-06-24 NOTE — Assessment & Plan Note (Signed)
I again discussed this diagnosis, discovered incidentally at the time of lung biopsy, with Mr. and Mrs. Gwendlyn Deutscher. I went over the pathology report and explained that this appeared to be metastatic from a primary somewhere in the digestive tract. It will be followed by Dr. Truddie Coco who is aware of the diagnosis. He already follows her for her history of breast cancer. There may be no specific treatment. The PET scan did not reveal an obvious lesion.

## 2011-07-02 ENCOUNTER — Other Ambulatory Visit: Payer: Self-pay | Admitting: Family Medicine

## 2011-08-18 ENCOUNTER — Other Ambulatory Visit: Payer: Self-pay | Admitting: Family Medicine

## 2011-08-19 ENCOUNTER — Encounter: Payer: Self-pay | Admitting: Family Medicine

## 2011-08-19 ENCOUNTER — Ambulatory Visit (INDEPENDENT_AMBULATORY_CARE_PROVIDER_SITE_OTHER): Payer: Medicare Other | Admitting: Family Medicine

## 2011-08-19 DIAGNOSIS — J309 Allergic rhinitis, unspecified: Secondary | ICD-10-CM

## 2011-08-19 MED ORDER — FLUTICASONE PROPIONATE 50 MCG/ACT NA SUSP: 2.0000 | Freq: Every day | NASAL | Status: DC | PRN

## 2011-08-19 NOTE — Patient Instructions (Signed)
Try over the counter anti-histamine such as Allegra, Claritan, or Zyrtec.

## 2011-08-19 NOTE — Progress Notes (Signed)
  Subjective:    Patient ID: [name] [name], female    DOB: 13-Feb-1933, 78 y.o.   MRN: [id number]  HPI  Patient is here with clear rhinorrhea and some sneezing over the past few days. She's had cough mostly productive of clear sputum. No increased dyspnea. No fever or chills. Increased postnasal drip symptoms. History of seasonal and perennial allergies. Currently not using any antihistamines.  Patient presented several months ago with persistent cough and abnormal chest x-ray. Initially felt to have Wegener's granulomatosis which was not confirmed on biopsy. Subsequent question of carcinoid tumor with normal hydroxyindoleacetic acid levels. Patient also had been placed on low-dose steroids her on this time and is currently being weaned off.  Very little cough at this time.   Review of Systems  Constitutional: Negative for fever and chills.  HENT: Positive for congestion, rhinorrhea, sneezing and postnasal drip. Negative for sore throat.   Respiratory: Positive for cough. Negative for shortness of breath and wheezing.   Neurological: Negative for headaches.  Hematological: Negative for adenopathy.       Objective:   Physical Exam  Constitutional: She appears well-developed and well-nourished.  HENT:  Right Ear: External ear normal.  Left Ear: External ear normal.  Mouth/Throat: Oropharynx is clear and moist.  Neck: Neck supple.  Cardiovascular: Normal rate and regular rhythm.   Pulmonary/Chest: Effort normal and breath sounds normal. No respiratory distress. She has no wheezes. She has no rales.          Assessment & Plan:  Rhinitis. Suspect related to allergies. Get back on Flonase 2 sprays per nostril once daily and try over-the-counter plain antihistamines such as Claritin, Allegra, or Zyrtec.

## 2011-09-01 ENCOUNTER — Other Ambulatory Visit: Payer: Self-pay | Admitting: Internal Medicine

## 2011-09-02 NOTE — Telephone Encounter (Signed)
Last seen 08/19/11 allergic rhinitis Last written 02/26/11 # 30 2Rf Please advise

## 2011-09-02 NOTE — Telephone Encounter (Signed)
Refill once

## 2011-09-02 NOTE — Telephone Encounter (Signed)
Called in

## 2011-09-09 ENCOUNTER — Other Ambulatory Visit (HOSPITAL_BASED_OUTPATIENT_CLINIC_OR_DEPARTMENT_OTHER): Payer: Medicare Other | Admitting: Lab

## 2011-09-09 ENCOUNTER — Ambulatory Visit (HOSPITAL_COMMUNITY)
Admission: RE | Admit: 2011-09-09 | Discharge: 2011-09-09 | Disposition: A | Discharge status: Home / Self Care | Payer: Medicare Other | Source: Ambulatory Visit | Attending: Physician Assistant | Admitting: Physician Assistant

## 2011-09-09 ENCOUNTER — Encounter (HOSPITAL_COMMUNITY): Payer: Self-pay

## 2011-09-09 DIAGNOSIS — K449 Diaphragmatic hernia without obstruction or gangrene: Secondary | ICD-10-CM | POA: Diagnosis not present

## 2011-09-09 DIAGNOSIS — Z8701 Personal history of pneumonia (recurrent): Secondary | ICD-10-CM | POA: Insufficient documentation

## 2011-09-09 DIAGNOSIS — C50919 Malignant neoplasm of unspecified site of unspecified female breast: Secondary | ICD-10-CM | POA: Insufficient documentation

## 2011-09-09 DIAGNOSIS — I517 Cardiomegaly: Secondary | ICD-10-CM | POA: Diagnosis not present

## 2011-09-09 DIAGNOSIS — C50419 Malignant neoplasm of upper-outer quadrant of unspecified female breast: Secondary | ICD-10-CM

## 2011-09-09 DIAGNOSIS — J189 Pneumonia, unspecified organism: Secondary | ICD-10-CM | POA: Diagnosis not present

## 2011-09-09 LAB — CMP (CANCER CENTER ONLY)
CO2: 29 mEq/L (ref 18–33)
Calcium: 9 mg/dL (ref 8.0–10.3)
Creat: 1 mg/dl (ref 0.6–1.2)
Glucose, Bld: 249 mg/dL — ABNORMAL HIGH (ref 73–118)
Total Bilirubin: 0.6 mg/dl (ref 0.20–1.60)
Total Protein: 7.7 g/dL (ref 6.4–8.1)

## 2011-09-09 LAB — CBC WITH DIFFERENTIAL/PLATELET
BASO%: 0.6 % (ref 0.0–2.0)
EOS%: 3.7 % (ref 0.0–7.0)
HCT: 30.4 % — ABNORMAL LOW (ref 34.8–46.6)
LYMPH%: 32.3 % (ref 14.0–49.7)
MCH: 31.7 pg (ref 25.1–34.0)
MCHC: 34.1 g/dL (ref 31.5–36.0)
MCV: 93.1 fL (ref 79.5–101.0)
NEUT%: 52.3 % (ref 38.4–76.8)
Platelets: 271 10*3/uL (ref 145–400)

## 2011-09-09 MED ORDER — IOHEXOL 300 MG/ML  SOLN
80.0000 mL | Freq: Once | INTRAMUSCULAR | Status: AC | PRN
  Administered 2011-09-09: 80 mL via INTRAVENOUS

## 2011-09-18 ENCOUNTER — Other Ambulatory Visit: Payer: Medicare Other | Admitting: Lab

## 2011-09-25 ENCOUNTER — Ambulatory Visit (HOSPITAL_BASED_OUTPATIENT_CLINIC_OR_DEPARTMENT_OTHER): Payer: Medicare Other | Admitting: Oncology

## 2011-09-25 ENCOUNTER — Telehealth: Payer: Self-pay | Admitting: Oncology

## 2011-09-25 VITALS — BP 166/78 | HR 71 | Temp 97.7°F | Ht 67.5 in | Wt 211.8 lb

## 2011-09-25 DIAGNOSIS — C7A029 Malignant carcinoid tumor of the large intestine, unspecified portion: Secondary | ICD-10-CM

## 2011-09-25 DIAGNOSIS — R918 Other nonspecific abnormal finding of lung field: Secondary | ICD-10-CM

## 2011-09-25 DIAGNOSIS — C50919 Malignant neoplasm of unspecified site of unspecified female breast: Secondary | ICD-10-CM

## 2011-09-25 DIAGNOSIS — Z853 Personal history of malignant neoplasm of breast: Secondary | ICD-10-CM

## 2011-09-25 DIAGNOSIS — E559 Vitamin D deficiency, unspecified: Secondary | ICD-10-CM

## 2011-09-25 NOTE — Progress Notes (Signed)
Hematology and Oncology Follow Up Visit  [name] [name] [id number] June 18, 1932 78 y.o. 09/25/2011 10:42 AM PCP Dr [name] [name] Dr [name] [name]  Principle Diagnosis: History of breast cancer, currently being evaluated for possible neuroendocrine carcinoma involving lung.  Interim History:  Since being seen last [name] [name] was evaluated by her primary care doctor for possible pneumonia. She was placed on oral antibiotics. She had a chest x-ray which suggested bilateral involvement of her lungs. She was subsequently seen by a pulmonologist who in turn was concerned about Wegener's disease. She she says he referred her for possible lung biopsy. This was performed by Dr. Roxan [name] on 04/22/2011. She has recovered reasonably well from this. She did require 5 day hospital stay as well some home O2. Currently she is off her antibiotics and feels well. She is no longer taking home O2. She is taking low-dose prednisone. Results are detailed below but essentially would appear that she does have evidence of atypical glandular cells in her pathology. Final staining suggested this is of neuroendocrine type likely of GI origin. She really does not exhibit any symptoms related to having a neuroendocrine or carcinoid type cancer.  Medications: I have reviewed the patient's current medications.  Allergies:  Allergies  Allergen Reactions  . Amoxicillin     GI upset, pt says lower doses are ok.  . Codeine Sulfate     REACTION: GI upset  . Sulfonamide Derivatives     REACTION: GI upset    Past Medical History, Surgical history, Social history, and Family History were reviewed and updated.  Review of Systems: Constitutional:  Negative for fever, chills, night sweats, anorexia, weight loss, pain. Cardiovascular: no chest pain or dyspnea on exertion Respiratory: no cough, shortness of breath, or wheezing Neurological: negative Dermatological: negative ENT: negative Skin Gastrointestinal:  negative Genito-Urinary: negative Hematological and Lymphatic: negative Breast: negative Musculoskeletal: negative Remaining ROS negative.  Physical Exam: Blood pressure 166/78, pulse 71, temperature 97.7 F (36.5 C), height 5' 7.5" (1.715 m), weight 211 lb 12.8 oz (96.072 kg). ECOG:  General appearance: alert, cooperative and appears stated age Head: Normocephalic, without obvious abnormality, atraumatic Neck: no adenopathy, no carotid bruit, no JVD, supple, symmetrical, trachea midline and thyroid not enlarged, symmetric, no tenderness/mass/nodules Lymph nodes: Cervical, supraclavicular, and axillary nodes normal. Cardiac : regular rate and rhythm, no murmurs or gallops Pulmonary:clear to auscultation bilaterally and normal percussion bilaterally Breasts: inspection negative, no nipple discharge or bleeding, no masses or nodularity palpable Abdomen:normal findings: bowel sounds normal Extremities negative Neuro: alert, oriented, normal speech, no focal findings or movement disorder noted  Lab Results: Lab Results  Component Value Date   WBC 6.3 09/09/2011   HGB 10.3* 09/09/2011   HCT 30.4* 09/09/2011   MCV 93.1 09/09/2011   PLT 271 09/09/2011     Chemistry      Component Value Date/Time   NA 136 09/09/2011 0939   NA 135 06/12/2011 1219   K 4.7 09/09/2011 0939   K 4.0 06/12/2011 1219   CL 95* 09/09/2011 0939   CL 97 06/12/2011 1219   CO2 29 09/09/2011 0939   CO2 28 06/12/2011 1219   BUN 17 09/09/2011 0939   BUN 15 06/12/2011 1219   CREATININE 1.0 09/09/2011 0939   CREATININE 0.93 06/12/2011 1219      Component Value Date/Time   CALCIUM 9.0 09/09/2011 0939   CALCIUM 9.3 06/12/2011 1219   ALKPHOS 45 09/09/2011 0939   ALKPHOS 48 06/12/2011 1219   AST 26 09/09/2011 0939  AST 18 06/12/2011 1219   ALT 14 06/12/2011 1219   BILITOT 0.60 09/09/2011 0939   BILITOT 0.3 06/12/2011 1219     REASON: [id number]: Further evaluation of the "focal atypical glands" reported by the outside  consultant pathologist. FINAL DIAGNOSIS Diagnosis 1. Lung, wedge biopsy/resection, left upper lobe - PATCHY ORGANIZING PNEUMONIA WITH CHRONIC INTERSTITIAL PNEUMONIA, HEMOSIDERIN DEPOSITS SUGGESTIVE OF CHRONIC CONGESTION AND FOCAL ATYPICAL GLANDS. SEE COMMENT. 2. Lung, wedge biopsy/resection, left upper lobe - PATCHY ORGANIZING PNEUMONIA WITH CHRONIC INTERSTITIAL PNEUMONIA, HEMOSIDERIN DEPOSITS SUGGESTIVE OF CHRONIC CONGESTION AND FOCAL ATYPICAL GLANDS. SEE COMMENT. Microscopic Comment 1. Please see the [facility] pathology report for the entire interpretation ([id number]). ADDENDUM: The atypical glands identified represent well differentiated low-grade neuroendocrine tumor. The neuroendocrine tumor has the following immunophenotype. Cytokeratin AE1/AE3 - diffuse, strong positivity CDX2 - diffuse, strong positivity TTF1 - negative expression GCDFP - negative expression Estrogen receptor - negative expression Synaptophysin - diffuse, strong expression Overall, the immunophenotype is diagnostic of neuroendocrine tumor. The lack of TTF1 expression and strong CDX2 expression are immunophenotypically consistent with metastatic neuroendocrine tumor of gastrointestinal origin. GMS stain is negative for yeast and fungi. Correlation with radiographic imaging is strongly recommended. The case was discussed with [name] [name], and [name] [name] on 05/13/2011. Case was reviewed by Dr. Lyndon [name] who essentially agrees. (CR:jy) 05/14/11 1 of 2 Amended copy Addendum FINAL for [name] [name] ([id number]) Microscopic Comment(continued) [name] [name] RUND DO Pathologist, Electronic Signature   (Case signed  Radiological Studies: chest X-ray n/a Mammogram n/a Bone density n/a  Impression and Plan:  This is a pleasant 78 year old woman with a history of breast cancer now presenting with what sounds like a neuroendocrine or carcinoid carcinoma and involvement normals likely a GI origin.  I discussed the natural history of this type of cancer. I also indicated that this was not a obvious finding and required some other input before it was apparent and so I tried to reassure her that this was likely an incidental finding. She should have evidence of pneumonia on her biopsy as well. My plan is to go ahead with some staging scans she really has a PET scan ordered for this week at, and I have added a CAT scan of the chest as well as was done 2 months ago truly look at and see if there is any changes in the infiltrates of her lungs. I've also ordered a 24-hour urine collection for 5 HIAA. I will plan to see her again in followup in 2-3 weeks to review results with her. If this is  obvious and there are new changes  we discussed that  treatment , might be indicated. although I think that given her age and other comorbid problems and maybe inclined to do a watchful waiting type approach as well.  More than 50% of the visit was spent in patient-related counselling   Eston Esters, MD 6/20/201310:42 AM

## 2011-09-25 NOTE — Telephone Encounter (Signed)
gve the pt her dec 2013 appt calendar along with the ct scan appt with instructions

## 2011-10-14 DIAGNOSIS — Z124 Encounter for screening for malignant neoplasm of cervix: Secondary | ICD-10-CM | POA: Diagnosis not present

## 2011-10-14 DIAGNOSIS — Z130 Encounter for screening for diseases of the blood and blood-forming organs and certain disorders involving the immune mechanism: Secondary | ICD-10-CM | POA: Diagnosis not present

## 2011-10-14 DIAGNOSIS — N950 Postmenopausal bleeding: Secondary | ICD-10-CM | POA: Diagnosis not present

## 2011-10-14 DIAGNOSIS — N814 Uterovaginal prolapse, unspecified: Secondary | ICD-10-CM | POA: Diagnosis not present

## 2011-10-16 ENCOUNTER — Ambulatory Visit
Admission: RE | Admit: 2011-10-16 | Discharge: 2011-10-16 | Disposition: A | Discharge status: Home / Self Care | Payer: Medicare Other | Source: Ambulatory Visit | Attending: Oncology | Admitting: Oncology

## 2011-10-16 DIAGNOSIS — C50919 Malignant neoplasm of unspecified site of unspecified female breast: Secondary | ICD-10-CM

## 2011-10-16 DIAGNOSIS — E871 Hypo-osmolality and hyponatremia: Secondary | ICD-10-CM

## 2011-10-16 DIAGNOSIS — M899 Disorder of bone, unspecified: Secondary | ICD-10-CM | POA: Diagnosis not present

## 2011-10-16 DIAGNOSIS — E785 Hyperlipidemia, unspecified: Secondary | ICD-10-CM

## 2011-10-21 ENCOUNTER — Encounter: Payer: Self-pay | Admitting: Family Medicine

## 2011-10-21 ENCOUNTER — Ambulatory Visit (INDEPENDENT_AMBULATORY_CARE_PROVIDER_SITE_OTHER): Payer: Medicare Other | Admitting: Family Medicine

## 2011-10-21 VITALS — BP 140/80 | Temp 98.6°F | Wt 215.0 lb

## 2011-10-21 DIAGNOSIS — E871 Hypo-osmolality and hyponatremia: Secondary | ICD-10-CM | POA: Diagnosis not present

## 2011-10-21 DIAGNOSIS — E785 Hyperlipidemia, unspecified: Secondary | ICD-10-CM

## 2011-10-21 DIAGNOSIS — E119 Type 2 diabetes mellitus without complications: Secondary | ICD-10-CM

## 2011-10-21 DIAGNOSIS — I10 Essential (primary) hypertension: Secondary | ICD-10-CM

## 2011-10-21 LAB — BASIC METABOLIC PANEL
BUN: 16 mg/dL (ref 6–23)
CO2: 26 mEq/L (ref 19–32)
Chloride: 96 mEq/L (ref 96–112)
Potassium: 4.6 mEq/L (ref 3.5–5.1)

## 2011-10-21 LAB — LIPID PANEL
Cholesterol: 158 mg/dL (ref 0–200)
Total CHOL/HDL Ratio: 3
VLDL: 47 mg/dL — ABNORMAL HIGH (ref 0.0–40.0)

## 2011-10-21 LAB — HEMOGLOBIN A1C: Hgb A1c MFr Bld: 7.5 % — ABNORMAL HIGH (ref 4.6–6.5)

## 2011-10-21 LAB — HEPATIC FUNCTION PANEL
Albumin: 3.8 g/dL (ref 3.5–5.2)
Alkaline Phosphatase: 38 U/L — ABNORMAL LOW (ref 39–117)
Total Bilirubin: 0.4 mg/dL (ref 0.3–1.2)

## 2011-10-21 MED ORDER — ROSUVASTATIN CALCIUM 5 MG PO TABS: 5.0000 mg | ORAL_TABLET | Freq: Every day | ORAL | Status: DC

## 2011-10-21 NOTE — Progress Notes (Signed)
  Subjective:    Patient ID: [name] [name], female    DOB: 19-Jan-1933, 78 y.o.   MRN: [id number]  HPI  Patient here for several issues. She has multiple chronic problems including type 2 diabetes, hypertension, hyperlipidemia, obesity, chronic vertigo with remote history of probable acute vestibular neuritis, history of depression, hypothyroidism. She has multiple issues today as follows.  Has some chronic postnasal drip symptoms. Takes Flonase regularly. Taken Allegra in the past but not recently. No purulent discharge. No fever or chills.  Type 2 diabetes. Blood sugars fasting usually 130-140. No A1c since last year - 7.8%. Takes Glucotrol and metformin. No hypoglycemia. No symptoms of hyperglycemia.  Hyperlipidemia treated with Crestor.  Compliant with therapy. No myalgias   Review of Systems  Constitutional: Negative for fatigue.  Eyes: Negative for visual disturbance.  Respiratory: Negative for cough, chest tightness, shortness of breath and wheezing.   Cardiovascular: Negative for chest pain, palpitations and leg swelling.  Neurological: Negative for dizziness, seizures, syncope, weakness, light-headedness and headaches.       Objective:   Physical Exam  Constitutional: She appears well-developed and well-nourished.  Neck: Neck supple. No thyromegaly present.  Cardiovascular: Normal rate and regular rhythm.   Pulmonary/Chest: Effort normal and breath sounds normal. No respiratory distress. She has no wheezes. She has no rales.  Musculoskeletal: She exhibits no edema.          Assessment & Plan:  #1 type 2 diabetes. Recheck A1c. Continue yearly eye exam. #2 hyperlipidemia. Check lipid and hepatic panel. Continue Crestor 5 mg daily  #3 hypertension stable. Recheck basic metabolic panel. Prior history of hyponatremia. Currently off diuretics.  Past hx hyponatremia on HCTZ.

## 2011-10-23 NOTE — Progress Notes (Signed)
Quick Note:  Pt informed on home VM ______ 

## 2011-10-27 ENCOUNTER — Telehealth: Payer: Self-pay | Admitting: Internal Medicine

## 2011-10-27 NOTE — Telephone Encounter (Signed)
Spoke with pt and she has enough prednisone for the 7 days  And has an appt with CY in aug. No rx was needed to be called in . Nothing further was needed.

## 2011-10-27 NOTE — Telephone Encounter (Signed)
Called, spoke with pt who c/o PND, HA, "not feeling good," and nausea x 1 wk. Also states she does have increased DOE, some wheezing, and coughing some with clear mucus.  Denies chest tightness or chest pain.  Is taking allerga qd with relief.  Reports she has some prednisone 10 mg tablets at home.  Would like to know if Dr. Annamaria Boots thinks she should start taking this.  Pls advise.  Thank you.  [facility]  Allergies verified:  Allergies  Allergen Reactions  . Amoxicillin     GI upset, pt says lower doses are ok.  . Codeine Sulfate     REACTION: GI upset  . Sulfonamide Derivatives     REACTION: GI upset

## 2011-10-28 DIAGNOSIS — Z471 Aftercare following joint replacement surgery: Secondary | ICD-10-CM | POA: Diagnosis not present

## 2011-10-28 DIAGNOSIS — M1710 Unilateral primary osteoarthritis, unspecified knee: Secondary | ICD-10-CM | POA: Diagnosis not present

## 2011-10-30 ENCOUNTER — Ambulatory Visit: Payer: Medicare Other | Admitting: Internal Medicine

## 2011-11-07 ENCOUNTER — Encounter: Payer: Self-pay | Admitting: Internal Medicine

## 2011-11-07 ENCOUNTER — Ambulatory Visit (INDEPENDENT_AMBULATORY_CARE_PROVIDER_SITE_OTHER): Payer: Medicare Other | Admitting: Internal Medicine

## 2011-11-07 DIAGNOSIS — J84116 Cryptogenic organizing pneumonia: Secondary | ICD-10-CM | POA: Diagnosis not present

## 2011-11-07 DIAGNOSIS — C7A029 Malignant carcinoid tumor of the large intestine, unspecified portion: Secondary | ICD-10-CM | POA: Diagnosis not present

## 2011-11-07 MED ORDER — FLUTICASONE-SALMETEROL 250-50 MCG/DOSE IN AEPB: 1.0000 | INHALATION_SPRAY | Freq: Two times a day (BID) | RESPIRATORY_TRACT | Status: DC

## 2011-11-07 NOTE — Patient Instructions (Addendum)
Sample and script Advair 250  1 puff, then rinse mouth well twice every day  Sample Dymista    1-2 puffs each nostril every night at bedtime

## 2011-11-07 NOTE — Assessment & Plan Note (Signed)
Incidental small implant found at time of VATS. No evidence of active disease seen since. She continues to be followed by Dr Sherill/ Oncology.

## 2011-11-07 NOTE — Progress Notes (Signed)
04/03/11- 78 year old female former smoking history referred courtesy of Dr. Elease Hashimoto with concern of abnormal chest CT. Husband is an allergy patient here. Dr. Erick Blinks recent office note reviewed "Followup from recent pneumonia. Patient had presented here with extreme myalgias and chronic pain along with bilateral shoulder hip pain. Very high sedimentation rate of 120.  She almost total resolution of myalgias with low dose prednisone strongly suggesting polymyalgia rheumatica. She subsequently presented with cough with trace of blood but no dyspnea, fever, reported a pain. Chest x-ray showed bilateral infiltrates suggesting probable bilateral pneumonia the recommendation for CT scan. CT scan revealed no adenopathy and no evidence for a lung mass. Compatible with bilateral pneumonia. Patient placed on antibiotic and much better at this time with no fever and essentially no cough. Her muscle and joint pains are almost 100% resolved with low-dose prednisone. She did present back in September with severe sinus disease suggesting acute sinusitis. CT revealed no acute findings. She denies any arthralgias at this time. No skin rash. No fever." She has been on prednisone 10 mg twice daily since December 13 and feels significantly better. Morning cough has cleared with residual trace phlegm and trace blood. No chest pain. CT chest 03/23/2011 -images reviewed by me-bilateral patchy airspace disease with air bronchograms. No cavitation or definite nodularity seen. She has had right total knee replacement but no generalized arthritis. Childhood exposure to an uncle with tuberculosis. Her PPD skin test was negative. She denies history of lung disease, GERD, DVT, kidney or liver disease. Lab- C-ANCA POS 1:320, P-ANCA NEG. RA 1:64, ANA NEG86 year old female former smoking history referred courtesy of Dr. Elease Hashimoto with concern of abnormal chest CT. Husband is an allergy patient here.  05/13/11- 65 yoF former smoker  followed for bilateral pulmonary infiltrates, incr C-ANCA, s/p VATS bx/ organizing pneumonia, Metastatic intestinal carcinoid. Marland Kitchen Hx breast Ca 2006/ Dr Truddie Coco Husband here. She returns now after left lung biopsy. She has expected left thoracotomy pain but is otherwise stable as she continues maintenance prednisone. Denies blood, fever, swollen glands, discolored sputum. Exertional dyspnea is not worse, allowing for the surgery. I shared with her and her husband the available initial pathology: Patchy organizing pneumonia with chronic interstitial pneumonia and focal atypical glands. I reviewed the discussion about her at thoracic oncology conference. The parenchymal lung process is not a vasculitis as I originally questioned. I told her there was an incidental finding of cancer cells with further identification pending but that this was a separate process. We agreed to refer her to Dr. Truddie Coco who managed her breast cancer. After she had left, personal communication from Dr. Jimmy Footman- neuroendocrine tumor staining consistent with metastatic intestinal carcinoid.  06/20/11  73 yoF former smoker followed for bilateral pulmonary infiltrates, incr C-ANCA, s/p VATS bx/ organizing pneumonia, Metastatic intestinal carcinoid. Marland Kitchen Hx breast Ca 2006/ Dr Truddie Coco   Husband here She says her breathing is now "fine" back to her normal. She denies fever, cough, sputum. I spoke with Mrs. Koplin myself and had to explain the discovery of carcinoid after her lung biopsy. We also have the note from the [facility] indicating they discussed it with her. She has absolutely no recollection. Little cough or shortness of breath now. Still a little posterior thoracotomy incision pain. CT 05/19/11- images reviewed with them- IMPRESSION:  1. Marked improvement in bilateral air space disease seen on  comparison CT of 03/24/2011. Near complete resolution.  2. Postoperative change in the left hemithorax consistent with  recent VATS.    Original  Report Authenticated By: Suzy Bouchard, M.D.   11/07/11- 73 yoF former smoker followed for bilateral pulmonary infiltrates, incr C-ANCA, s/p VATS bx/ organizing pneumonia, Metastatic intestinal carcinoid. Marland Kitchen Hx breast Ca 2006/ Dr Truddie Coco    c/o  Chronic vertigo, difficulty breathing and gets tired easy. Also some wheezing, post nasal,  and cough.  Some days she feels much better than others, no real pattern. Occasional light cough to clear throat. We had called in prednisone, which did help. Ambulatory around home, but gets out little. CT chest 09/09/11- I reviewed w/ her- there is significant patchy bilateral lower zone fibrotic scarring and some bronchial thickening. IMPRESSION:  1. No evidence of thoracic metastatic disease or other active  process.  2. Small mild cardiomegaly and hiatal hernia.  Original Report Authenticated By: Marlaine Hind, M.D.   ROS-see HPI Constitutional:   No-   weight loss, night sweats, fevers, chills, fatigue, lassitude. HEENT:   No-  headaches, difficulty swallowing, tooth/dental problems, sore throat,       No-  sneezing, itching, ear ache, nasal congestion, post nasal drip,  CV: +chest pain, orthopnea, PND, swelling in lower extremities, anasarca,   + chronic dizziness,   No-palpitations Resp: +   shortness of breath with exertion or at rest.              Minimal  productive cough,  No non-productive cough,               No-   change in color of mucus.  No- wheezing.   Skin: No-   rash or lesions. GI:  No-   heartburn, indigestion, abdominal pain, nausea, vomiting, diarrhea,                 change in bowel habits, loss of appetite GU: MS:  No-   joint pain or swelling.  No- decreased range of motion.  No- back pain. Neuro-     +Vertigo Psych:  No- change in mood or affect. No depression or anxiety.  No memory loss.  OBJ General- Alert, Oriented, Affect-appropriate, Distress- none acute, overweight Skin- rash-none, lesions- none, excoriation-  none Lymphadenopathy- none Head- atraumatic            Eyes- Gross vision intact, PERRLA, conjunctivae -pale            Ears- Hearing, canals-normal            Nose- turbinate edema, no-Septal dev, mucus, polyps, erosion, perforation             Throat- Mallampati II-III , mucosa clear , drainage- none, tonsils- atrophic Neck- flexible , trachea midline, no stridor , thyroid nl, carotid no bruit Chest - symmetrical excursion , unlabored           Heart/CV- RRR , 2-3/6 precordial systolic murmur , no gallop  , no rub, nl s1 s2                           - JVD- none , edema- none, stasis changes- none, varices- none           Lung- wet squeaks, wheeze- none, cough- none , dullness-none, rub- none           Chest wall- healed VATS thoracotomy incision L Abd-  Br/ Gen/ Rectal- Not done, not indicated Extrem- cyanosis- none, clubbing, none, atrophy- none, strength- nl. Wheelchair for distance/ cane at home. Limited mobility R shoulder -extension limited by pain. Neuro-  grossly intact to observation

## 2011-11-07 NOTE — Assessment & Plan Note (Signed)
There is residual bronchitis and some fibrotic scarring by exam. Plan- discussed steroid side effects- will try inhaled steroid with Advair. Education done.

## 2011-11-25 ENCOUNTER — Other Ambulatory Visit: Payer: Self-pay | Admitting: Family Medicine

## 2011-11-27 ENCOUNTER — Ambulatory Visit (INDEPENDENT_AMBULATORY_CARE_PROVIDER_SITE_OTHER): Payer: Medicare Other | Admitting: Family Medicine

## 2011-11-27 ENCOUNTER — Encounter: Payer: Self-pay | Admitting: Family Medicine

## 2011-11-27 VITALS — BP 154/70 | HR 73 | Temp 98.9°F | Wt 212.0 lb

## 2011-11-27 DIAGNOSIS — R600 Localized edema: Secondary | ICD-10-CM

## 2011-11-27 DIAGNOSIS — R0600 Dyspnea, unspecified: Secondary | ICD-10-CM

## 2011-11-27 DIAGNOSIS — I10 Essential (primary) hypertension: Secondary | ICD-10-CM | POA: Diagnosis not present

## 2011-11-27 DIAGNOSIS — R609 Edema, unspecified: Secondary | ICD-10-CM | POA: Diagnosis not present

## 2011-11-27 DIAGNOSIS — R0609 Other forms of dyspnea: Secondary | ICD-10-CM | POA: Diagnosis not present

## 2011-11-27 DIAGNOSIS — R0989 Other specified symptoms and signs involving the circulatory and respiratory systems: Secondary | ICD-10-CM

## 2011-11-27 LAB — HM DIABETES EYE EXAM: HM Diabetic Eye Exam: NORMAL

## 2011-11-27 NOTE — Progress Notes (Signed)
  Subjective:    Patient ID: [name] [name], female    DOB: [birth year], 78 y.o.   MRN: [id number]  HPI  Patient seen for followup hypertension and with some progressive ankle and foot swelling of the past week. She has some dyspnea which is increased. She has some baseline. No orthopnea. Recently seen by pulmonologist and started on Advair. No history of CHF. No chest pains. No cough or chest pain.  No fever or chills. No dietary changes. She's had challenging to control hypertension treated with Azor and beta blocker.  Taken Lasix in the past for brief episodes. She's had prior history of hyponatremia. Recent blood pressure 176/90.   Review of Systems  Constitutional: Negative for chills, fatigue and unexpected weight change.  Eyes: Negative for visual disturbance.  Respiratory: Positive for shortness of breath. Negative for cough, chest tightness and wheezing.   Cardiovascular: Positive for leg swelling. Negative for chest pain and palpitations.  Neurological: Negative for dizziness, seizures, syncope, weakness, light-headedness and headaches.       Objective:   Physical Exam  Constitutional: She is oriented to person, place, and time. She appears well-developed and well-nourished.  Cardiovascular: Normal rate and regular rhythm.   Pulmonary/Chest: Effort normal and breath sounds normal. No respiratory distress. She has no wheezes. She has no rales.  Musculoskeletal: She exhibits edema.  Neurological: She is alert and oriented to person, place, and time.          Assessment & Plan:  #1 hypertension suboptimal control. Add back low-dose furosemide 20 mg daily. Reassess one week and check basic metabolic panel been #2 leg and foot edema. Normal lung exam. She is tender to have edema intermittently in the past. Elevate legs frequently. Furosemide as above. BNP and further evaluation with chest x-ray in one week if not improving with dyspnea and edema

## 2011-11-27 NOTE — Patient Instructions (Signed)
Get back on furosemide/Lasix one daily and elevate legs frequently.

## 2011-12-04 DIAGNOSIS — H2510 Age-related nuclear cataract, unspecified eye: Secondary | ICD-10-CM | POA: Diagnosis not present

## 2011-12-05 ENCOUNTER — Telehealth: Payer: Self-pay | Admitting: Family Medicine

## 2011-12-05 ENCOUNTER — Ambulatory Visit (INDEPENDENT_AMBULATORY_CARE_PROVIDER_SITE_OTHER): Payer: Medicare Other | Admitting: Family Medicine

## 2011-12-05 ENCOUNTER — Encounter: Payer: Self-pay | Admitting: Family Medicine

## 2011-12-05 VITALS — BP 150/82 | Temp 98.1°F | Wt 215.0 lb

## 2011-12-05 DIAGNOSIS — E119 Type 2 diabetes mellitus without complications: Secondary | ICD-10-CM

## 2011-12-05 DIAGNOSIS — I10 Essential (primary) hypertension: Secondary | ICD-10-CM

## 2011-12-05 DIAGNOSIS — R609 Edema, unspecified: Secondary | ICD-10-CM | POA: Diagnosis not present

## 2011-12-05 DIAGNOSIS — E871 Hypo-osmolality and hyponatremia: Secondary | ICD-10-CM | POA: Diagnosis not present

## 2011-12-05 DIAGNOSIS — R600 Localized edema: Secondary | ICD-10-CM

## 2011-12-05 LAB — BASIC METABOLIC PANEL
BUN: 11 mg/dL (ref 6–23)
Chloride: 98 mEq/L (ref 96–112)
Creatinine, Ser: 1 mg/dL (ref 0.4–1.2)
Glucose, Bld: 128 mg/dL — ABNORMAL HIGH (ref 70–99)
Potassium: 4 mEq/L (ref 3.5–5.1)

## 2011-12-05 NOTE — Telephone Encounter (Signed)
Noted - will leave on desktop for nancy.

## 2011-12-05 NOTE — Telephone Encounter (Signed)
Script for diabetic testing supplies on 12/03/11 was sent to [facility], but there appears to be a correction on dosage instructions but change was not signed/initialed by MD. [name] [name] is resending the req form for Dr Elease Hashimoto to complete, sign and fax back.

## 2011-12-05 NOTE — Patient Instructions (Addendum)
Elevate legs frequently Consider daily weights and be in touch if weight increases 3 pounds or more in 2 days Consider daily use of support hose

## 2011-12-05 NOTE — Progress Notes (Signed)
  Subjective:    Patient ID: [name] [name], female    DOB: Nov 27, 1932, 78 y.o.   MRN: [id number]  HPI  Followup peripheral edema and hypertension. Refer to past note. Added furosemide 20 mg daily. She is elevating her legs frequently. Took consistently for about 5 days and then had increased fatigue symptoms and stopped. Her weight is up 3 pounds today. Dyspnea is slightly improved. No chest pains. No increased cough. No fever. No chills. History of hyponatremia with hydrochlorothiazide. Compliant with other medications. Blood sugars have remained stable. No dietary changes. Generally low-sodium diet. She has compression hose but has not worn these  Past Medical History  Diagnosis Date  . HYPOTHYROIDISM 07/24/2008  . HYPERLIPIDEMIA 07/24/2008  . DEPRESSION 05/16/2009  . HYPERTENSION 07/24/2008  . Vertigo   . Breast cancer   . Diabetes mellitus type II   . Arthritis     r tkr  . CVA (cerebral vascular accident) 2008    mini stroke.no residual  . Heart murmur     stress test 2009.dr peter Martinique  . Intermittent vertigo   . Skin abnormality     facial lesions .pt applying mupiracin to areas   Past Surgical History  Procedure Date  . Knee surgery 2009    TKR  . Cholecystectomy 1980  . Joint replacement     r knee  . Breast surgery 2007    Lumpectomy, XRT 2006.l breast  . Video assisted thoracoscopy 04/22/2011    Procedure: VIDEO ASSISTED THORACOSCOPY;  Surgeon: Melrose Nakayama, MD;  Location: [facility];  Service: Thoracic;  Laterality: Left;  WITH BIOPSY    reports that she quit smoking about 29 years ago. Her smoking use included Cigarettes. She has a 6 pack-year smoking history. She does not have any smokeless tobacco history on file. She reports that she does not drink alcohol. Her drug history not on file. family history includes Arthritis in her mother; Breast cancer in her sister; Cancer in her sister; Coronary artery disease in her brother and father; Heart disease in her father;  Lung cancer in her brother; and Rheum arthritis in her mother. Allergies  Allergen Reactions  . Amoxicillin     GI upset, pt says lower doses are ok.  . Codeine Sulfate     REACTION: GI upset  . Sulfonamide Derivatives     REACTION: GI upset     Review of Systems  Constitutional: Positive for fatigue. Negative for fever and chills.  Respiratory: Positive for shortness of breath. Negative for cough and wheezing.   Cardiovascular: Positive for leg swelling. Negative for chest pain and palpitations.  Gastrointestinal: Negative for abdominal pain.  Genitourinary: Negative for dysuria.  Neurological: Negative for headaches.       Objective:   Physical Exam  Constitutional: She appears well-developed and well-nourished.  Neck: Neck supple.  Cardiovascular: Normal rate and regular rhythm.   Pulmonary/Chest: Effort normal.       Faint crackles in both lung bases. No wheezes  Musculoskeletal: She exhibits edema.       Trace to 1+ pitting edema legs bilaterally          Assessment & Plan:  #1 hypertension. Stable #2 bilateral leg edema. Inconsistent use of furosemide. Recheck basic metabolic panel. Support hose daily. Frequent leg elevation.  #3 type 2 diabetes. Stable home readings. Recent A1c 7.5%. Weight loss has been difficult for her because of sedentary lifestyle

## 2011-12-09 NOTE — Progress Notes (Signed)
Quick Note:  Called and spoke with pt and pt is aware. ______

## 2011-12-09 NOTE — Telephone Encounter (Signed)
I spoke with pt, she is testing 2 X daily and has plenty of supplies for now, form faxed back

## 2012-01-09 ENCOUNTER — Ambulatory Visit (INDEPENDENT_AMBULATORY_CARE_PROVIDER_SITE_OTHER): Payer: Medicare Other | Admitting: Family Medicine

## 2012-01-09 ENCOUNTER — Encounter: Payer: Self-pay | Admitting: Family Medicine

## 2012-01-09 VITALS — BP 142/82 | Temp 98.0°F | Wt 216.0 lb

## 2012-01-09 DIAGNOSIS — Z23 Encounter for immunization: Secondary | ICD-10-CM | POA: Diagnosis not present

## 2012-01-09 DIAGNOSIS — R609 Edema, unspecified: Secondary | ICD-10-CM

## 2012-01-09 DIAGNOSIS — R110 Nausea: Secondary | ICD-10-CM | POA: Diagnosis not present

## 2012-01-09 DIAGNOSIS — R600 Localized edema: Secondary | ICD-10-CM

## 2012-01-09 DIAGNOSIS — E119 Type 2 diabetes mellitus without complications: Secondary | ICD-10-CM

## 2012-01-09 MED ORDER — ONDANSETRON HCL 4 MG PO TABS: 4.0000 mg | ORAL_TABLET | Freq: Three times a day (TID) | ORAL | Status: DC | PRN

## 2012-01-09 NOTE — Progress Notes (Signed)
Subjective:    Patient ID: [name] [name], female    DOB: 1933-02-24, 78 y.o.   MRN: [id number]  HPI  Medical followup. Her edema is stable. Improves with elevation. Worse late day. She is reluctant to take furosemide because of fatigue issues. Hyponatremia with HCTZ.  Recent electrolytes were normal. Her weight is stable. No increased dyspnea. Very sedentary and rarely gets up ambulate.  She has some nausea which is inconsistent and occasionally following meals. No associated abdominal pain. Has previously taken Zofran which helps. No vomiting. No recent stool changes. Diabetes has been stable. Most recent A1c 7.5%. No hypoglycemic symptoms. Patient needs flu vaccine.  Past Medical History  Diagnosis Date  . HYPOTHYROIDISM 07/24/2008  . HYPERLIPIDEMIA 07/24/2008  . DEPRESSION 05/16/2009  . HYPERTENSION 07/24/2008  . Vertigo   . Breast cancer   . Diabetes mellitus type II   . Arthritis     r tkr  . CVA (cerebral vascular accident) 2008    mini stroke.no residual  . Heart murmur     stress test 2009.dr peter Martinique  . Intermittent vertigo   . Skin abnormality     facial lesions .pt applying mupiracin to areas   Past Surgical History  Procedure Date  . Knee surgery 2009    TKR  . Cholecystectomy 1980  . Joint replacement     r knee  . Breast surgery 2007    Lumpectomy, XRT 2006.l breast  . Video assisted thoracoscopy 04/22/2011    Procedure: VIDEO ASSISTED THORACOSCOPY;  Surgeon: Melrose Nakayama, MD;  Location: [facility];  Service: Thoracic;  Laterality: Left;  WITH BIOPSY    reports that she quit smoking about 29 years ago. Her smoking use included Cigarettes. She has a 6 pack-year smoking history. She does not have any smokeless tobacco history on file. She reports that she does not drink alcohol. Her drug history not on file. family history includes Arthritis in her mother; Breast cancer in her sister; Cancer in her sister; Coronary artery disease in her brother and father; Heart  disease in her father; Lung cancer in her brother; and Rheum arthritis in her mother. Allergies  Allergen Reactions  . Amoxicillin     GI upset, pt says lower doses are ok.  . Codeine Sulfate     REACTION: GI upset  . Sulfonamide Derivatives     REACTION: GI upset      Review of Systems  Constitutional: Negative for fever and chills.  HENT: Negative for trouble swallowing.   Respiratory: Negative for cough and shortness of breath.   Cardiovascular: Positive for leg swelling. Negative for chest pain and palpitations.  Gastrointestinal: Positive for nausea. Negative for vomiting, abdominal pain, diarrhea, blood in stool and abdominal distention.  Neurological: Negative for dizziness.       Objective:   Physical Exam  Constitutional: She is oriented to person, place, and time. She appears well-developed and well-nourished.  Neck: Neck supple.  Cardiovascular: Normal rate and regular rhythm.   Pulmonary/Chest: Effort normal.       She has some faint crackles in right base greater than left. These are apparently chronic. No wheezes  Musculoskeletal: She exhibits edema.       Trace pitting edema legs bilaterally  Neurological: She is alert and oriented to person, place, and time.          Assessment & Plan:  #1 bilateral leg edema. Suspect venous stasis. Increased elevation. She has compression hose but reluctant to use. She'll  continue as needed Lasix for severe edema #2 type 2 diabetes. Checking infrequently. Check A1c at followup  #3 health maintenance. Flu vaccine given  #4 intermittent nausea. Question gastroparesis. Patient reluctant to use Reglan after reviewing possible side effects. Refilled Zofran 4 mg to use as needed for severe nausea. Consider gastric emptying study if symptoms persist or worsen.

## 2012-01-11 ENCOUNTER — Other Ambulatory Visit: Payer: Self-pay | Admitting: Family Medicine

## 2012-01-12 NOTE — Telephone Encounter (Signed)
Diazepam last filled 09-01-11, #30 with 0 refills

## 2012-01-12 NOTE — Telephone Encounter (Signed)
Refill once 

## 2012-01-21 ENCOUNTER — Ambulatory Visit: Payer: Medicare Other | Admitting: Family Medicine

## 2012-01-23 ENCOUNTER — Encounter: Payer: Self-pay | Admitting: Family Medicine

## 2012-01-23 ENCOUNTER — Ambulatory Visit (INDEPENDENT_AMBULATORY_CARE_PROVIDER_SITE_OTHER): Payer: Medicare Other | Admitting: Family Medicine

## 2012-01-23 VITALS — BP 118/70 | HR 82 | Temp 98.3°F | Wt 220.0 lb

## 2012-01-23 DIAGNOSIS — J309 Allergic rhinitis, unspecified: Secondary | ICD-10-CM

## 2012-01-23 DIAGNOSIS — R609 Edema, unspecified: Secondary | ICD-10-CM

## 2012-01-23 MED ORDER — FUROSEMIDE 20 MG PO TABS: 20.0000 mg | ORAL_TABLET | Freq: Every day | ORAL | Status: DC

## 2012-01-23 NOTE — Progress Notes (Signed)
Chief Complaint  Patient presents with  . Cough    congestion, stopped up, nausea, left eye swollen     HPI: -started: about 2 weeks ago flare of seasonal allergies -symptoms:nasal congestion, sneezing, itchy eyes, watery eyes, eyes a little swollen, cough -denies:fever, SOB, NVD, tooth pain, sore throat -has tried: nothing -Hx OT:RRNHAFBX allergies that flare in the spring and fall, usually takes Claritin and flonase for this, but not taking - has these at home -some swelling in legs, reports this is stable. Weight decreased a few lbs over last several weeks on home scale per patient. Wt up a little here, but different scale used. Pt denies change in breathing. Pt reports she is supposed to take lasix every other day, but she stopped doing this the last week. Wonders if ok not to take.   ROS: See pertinent positives and negatives per HPI.  Past Medical History  Diagnosis Date  . HYPOTHYROIDISM 07/24/2008  . HYPERLIPIDEMIA 07/24/2008  . DEPRESSION 05/16/2009  . HYPERTENSION 07/24/2008  . Vertigo   . Breast cancer   . Diabetes mellitus type II   . Arthritis     r tkr  . CVA (cerebral vascular accident) 2008    mini stroke.no residual  . Heart murmur     stress test 2009.dr peter Martinique  . Intermittent vertigo   . Skin abnormality     facial lesions .pt applying mupiracin to areas    Family History  Problem Relation Age of Onset  . Arthritis Mother   . Rheum arthritis Mother   . Heart disease Father   . Coronary artery disease Father   . Cancer Sister     breast CA, both sisters  . Breast cancer Sister   . Coronary artery disease Brother   . Lung cancer Brother     History   Social History  . Marital Status: Married    Spouse Name: N/A    Number of Children: N/A  . Years of Education: N/A   Social History Main Topics  . Smoking status: Former Smoker -- 0.5 packs/day for 12 years    Types: Cigarettes    Quit date: 06/04/1982  . Smokeless tobacco: None  . Alcohol  Use: No  . Drug Use: None  . Sexually Active: Yes    Birth Control/ Protection: Post-menopausal   Other Topics Concern  . None   Social History Narrative  . None    Current outpatient prescriptions:amLODipine-olmesartan (AZOR) 5-40 MG per tablet, Take 1 tablet by mouth daily., Disp: 30 tablet, Rfl: 5;  aspirin 81 MG tablet, Take 81 mg by mouth daily.  , Disp: , Rfl: ;  BYSTOLIC 10 MG tablet, TAKE ONE TABLET BY MOUTH EVERY DAY, Disp: 90 each, Rfl: 0;  CALCIUM CITRATE PO, Take 1,200 mg by mouth daily.  , Disp: , Rfl:  cholecalciferol (VITAMIN D) 1000 UNITS tablet, Take 3,000 Units by mouth daily.  , Disp: , Rfl: ;  cyanocobalamin 100 MCG tablet, Take 100 mcg by mouth daily., Disp: , Rfl: ;  diazepam (VALIUM) 5 MG tablet, TAKE ONE TABLET BY MOUTH ONCE DAILY AS NEEDED, Disp: 30 tablet, Rfl: 0;  ergocalciferol (VITAMIN D2) 50000 UNITS capsule, Take 50,000 Units by mouth once a week., Disp: , Rfl:  FeFum-FePo-FA-B Cmp-C-Zn-Mn-Cu (RE DUALVIT PLUS) 162-115.2-1 MG CAPS, TAKE ONE CAPSULE BY MOUTH EVERY DAY, Disp: 30 each, Rfl: 10;  fluticasone (FLONASE) 50 MCG/ACT nasal spray, Place 2 sprays into the nose daily as needed. For stuffy nose., Disp:  16 g, Rfl: 11;  Fluticasone-Salmeterol (ADVAIR DISKUS) 250-50 MCG/DOSE AEPB, Inhale 1 puff into the lungs 2 (two) times daily., Disp: 60 each, Rfl: 5 furosemide (LASIX) 20 MG tablet, Take 20 mg by mouth daily., Disp: , Rfl: ;  glipiZIDE (GLUCOTROL) 10 MG tablet, TAKE ONE TABLET BY MOUTH TWICE DAILY, Disp: 180 tablet, Rfl: 0;  levothyroxine (LEVOTHROID) 125 MCG tablet, Take 1 tablet (125 mcg total) by mouth daily., Disp: 30 tablet, Rfl: 11;  metFORMIN (GLUCOPHAGE-XR) 500 MG 24 hr tablet, TAKE TWO TABLETS BY MOUTH TWICE DAILY, Disp: 120 tablet, Rfl: 11 Multiple Vitamins-Minerals (CENTRUM ULTRA WOMENS) TABS, Take by mouth daily.  , Disp: , Rfl: ;  multivitamin (METANX) 3-35-2 MG TABS tablet, TAKE ONE TABLET BY MOUTH EVERY DAY, Disp: 90 tablet, Rfl: 3;  ondansetron  (ZOFRAN) 4 MG tablet, Take 1 tablet (4 mg total) by mouth every 8 (eight) hours as needed for nausea., Disp: 30 tablet, Rfl: 0;  pyridOXINE (VITAMIN B-6) 100 MG tablet, Take 200 mg by mouth daily. , Disp: , Rfl:  rosuvastatin (CRESTOR) 5 MG tablet, Take 1 tablet (5 mg total) by mouth daily., Disp: 30 tablet, Rfl: 11  EXAM:  Filed Vitals:   01/23/12 0949  BP: 118/70  Pulse: 82  Temp: 98.3 F (36.8 C)    There is no height on file to calculate BMI.  GENERAL: vitals reviewed and listed above, alert, oriented, appears well hydrated and in no acute distress  HEENT: atraumatic, conjunttiva clear, no obvious abnormalities on inspection of external nose and ears, ear canal and TM normal, clear rhinorrhea with boggy turbinates, mild swelling around eyes, watery eyes, PND  NECK: no obvious masses on inspection  LUNGS: clear to auscultation bilaterally, no wheezes, rales or rhonchi, good air movement  CV: HRRR, 1+ bilat LE edema  MS: moves all extremities without noticeable abnormality  PSYCH: pleasant and cooperative, no obvious depression or anxiety  ASSESSMENT AND PLAN:  Discussed the following assessment and plan:  1. Edema   2. Allergic rhinitis     -Patient advised to return or notify a doctor immediately if symptoms worsen or persist or new concerns arise.  Patient Instructions  NSTRUCTIONS FOR UPPER RESPIRATORY INFECTION:  -plenty of rest and fluids  -nasal saline wash 2-3 times daily (use prepackaged nasal saline or bottled/distilled water if making your own)   -clean nose with nasal saline before using the nasal steroid or sinex  -can use sinex nasal spray for drainage and nasal congestion - but do NOT use longer then 3-4 days  -can use tylenol or ibuprofen as directed for aches and sorethroat  -in the winter time, using a humidifier at night is helpful (please follow cleaning instructions)  -if you are taking a cough medication - use only as directed, may also  try a teaspoon of honey to coat the throat and throat lozenges  -for sore throat, salt water gargles can help  -follow up if you have fevers, are worsening or not getting better in 5-7 days      Madden Garron R.

## 2012-01-23 NOTE — Addendum Note (Signed)
Addended by: Colleen Can on: 01/23/2012 11:37 AM   Modules accepted: Orders

## 2012-01-23 NOTE — Patient Instructions (Addendum)
-  restart your flonase 2 sprays each nostril daily  -restart Claritin  -cool compresses to eyes  -restart Lasix as prescribed by your doctor  -wear your compression stockings  -monitor weight and if increases notify your doctor   -follow up with your doctor in 2 weeks

## 2012-02-09 ENCOUNTER — Telehealth: Payer: Self-pay | Admitting: Cardiology

## 2012-02-09 ENCOUNTER — Ambulatory Visit (INDEPENDENT_AMBULATORY_CARE_PROVIDER_SITE_OTHER): Payer: Medicare Other | Admitting: Internal Medicine

## 2012-02-09 ENCOUNTER — Encounter: Payer: Self-pay | Admitting: Internal Medicine

## 2012-02-09 VITALS — BP 122/82 | HR 77 | Ht 67.5 in | Wt 217.0 lb

## 2012-02-09 DIAGNOSIS — I4891 Unspecified atrial fibrillation: Secondary | ICD-10-CM

## 2012-02-09 DIAGNOSIS — J84116 Cryptogenic organizing pneumonia: Secondary | ICD-10-CM

## 2012-02-09 DIAGNOSIS — I499 Cardiac arrhythmia, unspecified: Secondary | ICD-10-CM

## 2012-02-09 NOTE — Progress Notes (Signed)
04/03/11- 78 year old female former smoking history referred courtesy of Dr. Elease [name] with concern of abnormal chest CT. Husband is an allergy patient here. Dr. Erick [name] recent office [name] reviewed "Followup from recent pneumonia. Patient had presented here with extreme myalgias and chronic pain along with bilateral shoulder hip pain. Very high sedimentation rate of 120.  She almost total resolution of myalgias with low dose prednisone strongly suggesting polymyalgia rheumatica. She subsequently presented with cough with trace of blood but no dyspnea, fever, reported a pain. Chest x-ray showed bilateral infiltrates suggesting probable bilateral pneumonia the recommendation for CT scan. CT scan revealed no adenopathy and no evidence for a lung mass. Compatible with bilateral pneumonia. Patient placed on antibiotic and much better at this time with no fever and essentially no cough. Her muscle and joint pains are almost 100% resolved with low-dose prednisone. She did present back in September with severe sinus disease suggesting acute sinusitis. CT revealed no acute findings. She denies any arthralgias at this time. No skin rash. No fever." She has been on prednisone 10 mg twice daily since December 13 and feels significantly better. Morning cough has cleared with residual trace phlegm and trace blood. No chest pain. CT chest 03/23/2011 -images reviewed by me-bilateral patchy airspace disease with air bronchograms. No cavitation or definite nodularity seen. She has had right total knee replacement but no generalized arthritis. Childhood exposure to an uncle with tuberculosis. Her PPD skin test was negative. She denies history of lung disease, GERD, DVT, kidney or liver disease. Lab- C-ANCA POS 1:320, P-ANCA NEG. RA 1:4, ANA NEG89 year old female former smoking history referred courtesy of Dr. Elease [name] with concern of abnormal chest CT. Husband is an allergy patient here.  05/13/11- 8 yoF former smoker  followed for bilateral pulmonary infiltrates, incr C-ANCA, s/p VATS bx/ organizing pneumonia, Metastatic intestinal carcinoid. Marland Kitchen Hx breast Ca 2006/ Dr [name] [name] Husband here. She returns now after left lung biopsy. She has expected left thoracotomy pain but is otherwise stable as she continues maintenance prednisone. Denies blood, fever, swollen glands, discolored sputum. Exertional dyspnea is not worse, allowing for the surgery. I shared with her and her husband the available initial pathology: Patchy organizing pneumonia with chronic interstitial pneumonia and focal atypical glands. I reviewed the discussion about her at thoracic oncology conference. The parenchymal lung process is not a vasculitis as I originally questioned. I told her there was an incidental finding of cancer cells with further identification pending but that this was a separate process. We agreed to refer her to Dr. Truddie [name] who managed her breast cancer. After she had left, personal communication from Dr. Jimmy [name]- neuroendocrine tumor staining consistent with metastatic intestinal carcinoid.  06/20/11  35 yoF former smoker followed for bilateral pulmonary infiltrates, incr C-ANCA, s/p VATS bx/ organizing pneumonia, Metastatic intestinal carcinoid. Marland Kitchen Hx breast Ca 2006/ Dr [name] [name]   Husband here She says her breathing is now "fine" back to her normal. She denies fever, cough, sputum. I spoke with [name] [name] myself and had to explain the discovery of carcinoid after her lung biopsy. We also have the [name] from the [facility] indicating they discussed it with her. She has absolutely no recollection. Little cough or shortness of breath now. Still a little posterior thoracotomy incision pain. CT 05/19/11- images reviewed with them- IMPRESSION:  1. Marked improvement in bilateral air space disease seen on  comparison CT of 03/24/2011. Near complete resolution.  2. Postoperative change in the left hemithorax consistent with  recent VATS.    Original  Report Authenticated By: [name] [name], M.D.   11/07/11- 84 yoF former smoker followed for bilateral pulmonary infiltrates, incr C-ANCA, s/p VATS bx/ organizing pneumonia, Metastatic intestinal carcinoid. Marland Kitchen Hx breast Ca 2006/ Dr [name] [name]    c/o  Chronic vertigo, difficulty breathing and gets tired easy. Also some wheezing, post nasal,  and cough.  Some days she feels much better than others, no real pattern. Occasional light cough to clear throat. We had called in prednisone, which did help. Ambulatory around home, but gets out little. CT chest 09/09/11- I reviewed w/ her- there is significant patchy bilateral lower zone fibrotic scarring and some bronchial thickening. IMPRESSION:  1. No evidence of thoracic metastatic disease or other active  process.  2. Small mild cardiomegaly and hiatal hernia.  Original Report Authenticated By: [name] [name], M.D.    02/09/12-  40 yoF former smoker followed for bilateral pulmonary infiltrates, incr C-ANCA, s/p VATS bx/ cryptogenic organizing pneumonia/ BOOP, Metastatic intestinal carcinoid. Marland Kitchen Hx breast Ca 2006/ Dr [name] [name]  January had biopsy to ck for other lung condition. SOB and wheezing at times. Left sided pain around the  Back. Continues Advair 250. Due for followup chest CT in December for [name] [name]. Not on prednisone. She says breathing is unchanged. Denies pain, blood or swollen nodes.   ROS-see HPI Constitutional:   No-   weight loss, night sweats, fevers, chills, fatigue, lassitude. HEENT:   No-  headaches, difficulty swallowing, tooth/dental problems, sore throat,       No-  sneezing, itching, ear ache, nasal congestion, post nasal drip,  CV: +chest pain, orthopnea, PND, swelling in lower extremities, anasarca,   + chronic dizziness,   No-palpitations Resp: +   shortness of breath with exertion or at rest.              Minimal  productive cough,  No non-productive cough,               No-   change in color of mucus.  No- wheezing.    Skin: No-   rash or lesions. GI:  No-   heartburn, indigestion, abdominal pain, nausea, vomiting,  GU: MS:  No-   joint pain or swelling.  Neuro-     +Vertigo- uses wheelchair Psych:  No- change in mood or affect. No depression or anxiety.  No memory loss.  OBJ General- Alert, Oriented, Affect-appropriate, Distress- none acute, overweight, wheelchair Skin- rash-none, lesions- none, excoriation- none Lymphadenopathy- none Head- atraumatic            Eyes- Gross vision intact, PERRLA, conjunctivae -pale            Ears- Hearing, canals-normal            Nose- turbinate edema, no-Septal dev, mucus, polyps, erosion, perforation             Throat- Mallampati II-III , mucosa clear , drainage- none, tonsils- atrophic Neck- flexible , trachea midline, no stridor , thyroid nl, carotid no bruit Chest - symmetrical excursion , unlabored           Heart/CV-+ irregularly irregular , 2-3/6 precordial systolic murmur , no gallop  , no rub, nl s1 s2                           - JVD- none , edema- none, stasis changes- none, varices- none           Lung- +wet squeaks right base, wheeze- none,  cough- none , dullness-none, rub- none           Chest wall- healed VATS thoracotomy incision L Abd-  Br/ Gen/ Rectal- Not done, not indicated Extrem- cyanosis- none, clubbing, none, atrophy- none, strength- nl. Wheelchair for distance/ cane at home. Limited mobility R shoulder -extension limited by pain. Neuro- grossly intact to observation

## 2012-02-09 NOTE — Telephone Encounter (Signed)
Dr Doug Sou schedule is 100% booked until then.  Will forward to his nurse to see if there is a place to schedule her in.

## 2012-02-09 NOTE — Patient Instructions (Addendum)
EKG- dx irregular heart beat  Refer - old patient of Dr Martinique needs return appointment for new dx of atrial fibrillation  Continue daily baby aspirin  Keep Dr Julien Girt appointment for a follow-up CT scan in December  Continue Advair for now. You can use it just one puff, once daily for now.

## 2012-02-09 NOTE — Telephone Encounter (Signed)
New Problem:    Golden Circle form [facility] called in wanting to sghedule patient to be seen sooner that 03/09/12 because patient is in Afib. Please call Libby back. [phone].

## 2012-02-11 NOTE — Telephone Encounter (Signed)
Patient called was told Golden Circle from [facility] requested needs to be seen sooner than 03/09/12.Appointment scheduled with Dr.Jordan 02/16/12.

## 2012-02-13 ENCOUNTER — Other Ambulatory Visit: Payer: Self-pay | Admitting: Family Medicine

## 2012-02-16 ENCOUNTER — Encounter: Payer: Self-pay | Admitting: Cardiology

## 2012-02-16 ENCOUNTER — Ambulatory Visit (INDEPENDENT_AMBULATORY_CARE_PROVIDER_SITE_OTHER): Payer: Medicare Other | Admitting: Cardiology

## 2012-02-16 VITALS — BP 145/90 | HR 68 | Ht 67.0 in | Wt 213.2 lb

## 2012-02-16 DIAGNOSIS — E119 Type 2 diabetes mellitus without complications: Secondary | ICD-10-CM

## 2012-02-16 DIAGNOSIS — I4891 Unspecified atrial fibrillation: Secondary | ICD-10-CM | POA: Diagnosis not present

## 2012-02-16 DIAGNOSIS — E785 Hyperlipidemia, unspecified: Secondary | ICD-10-CM

## 2012-02-16 DIAGNOSIS — I10 Essential (primary) hypertension: Secondary | ICD-10-CM | POA: Diagnosis not present

## 2012-02-16 MED ORDER — APIXABAN 5 MG PO TABS: 5.0000 mg | ORAL_TABLET | Freq: Two times a day (BID) | ORAL | Status: DC

## 2012-02-16 NOTE — Progress Notes (Signed)
Dionicio Stall Date of Birth: Mar 29, 1933 Medical Record #[id number]  History of Present Illness: Mrs. Fredrick is seen at the request of Dr. Annamaria Boots for evaluation of atrial fibrillation. She is a pleasant 78 year old white female. I had actually seen her in 2009 at which time she had a adenosine Myoview study for preoperative clearance for knee surgery. Her study showed breast attenuation but no significant ischemia. Ejection fraction was normal 69%. Earlier this year she was evaluated for an abnormal chest CT. She actually underwent lung biopsy. There was some concern about whether she had Wegener's granulomatosis but biopsies were negative for this. Apparently there was some concern for metastatic intestinal carcinoid but subsequent PET scan was negative. She had normal hormonal levels on blood draw. I think ultimately it was decided that this probably was pneumonia. Recent followup with Dr. Annamaria Boots she was noted to have an irregular pulse and ECG confirmed that she was in atrial fibrillation with controlled ventricular response. She reports a couple of months ago she had some palpitations but really has been asymptomatic otherwise. She does have some chronic shortness of breath. Her activity is limited because of vertigo. She does walk with a cane at home and uses a wheelchair when she gets out. She has had some swelling in her legs for which she takes Lasix every other day. Higher doses of Lasix of resulted in hyponatremia. She denies any history of bleeding. She has had a prior TIA.  Current Outpatient Prescriptions on File Prior to Visit  Medication Sig Dispense Refill  . amLODipine-olmesartan (AZOR) 5-40 MG per tablet Take 1 tablet by mouth daily.  30 tablet  5  . BYSTOLIC 10 MG tablet TAKE ONE TABLET BY MOUTH EVERY DAY  90 each  0  . CALCIUM CITRATE PO Take 1,200 mg by mouth daily.        . cholecalciferol (VITAMIN D) 1000 UNITS tablet Take 3,000 Units by mouth daily.        . cyanocobalamin 100 MCG  tablet Take 100 mcg by mouth daily.      . diazepam (VALIUM) 5 MG tablet TAKE ONE TABLET BY MOUTH ONCE DAILY AS NEEDED  30 tablet  0  . FeFum-FePo-FA-B Cmp-C-Zn-Mn-Cu (SE-TAN PLUS) 162-115.2-1 MG CAPS TAKE ONE CAPSULE BY MOUTH EVERY DAY  30 capsule  11  . fluticasone (FLONASE) 50 MCG/ACT nasal spray Place 2 sprays into the nose daily as needed. For stuffy nose.  16 g  11  . Fluticasone-Salmeterol (ADVAIR DISKUS) 250-50 MCG/DOSE AEPB Inhale 1 puff into the lungs 2 (two) times daily.  60 each  5  . furosemide (LASIX) 20 MG tablet Take 1 tablet (20 mg total) by mouth daily.  30 tablet  0  . glipiZIDE (GLUCOTROL) 10 MG tablet TAKE ONE TABLET BY MOUTH TWICE DAILY  180 tablet  0  . levothyroxine (LEVOTHROID) 125 MCG tablet Take 1 tablet (125 mcg total) by mouth daily.  30 tablet  11  . loratadine (CLARITIN) 10 MG tablet Take 10 mg by mouth daily.      . metFORMIN (GLUCOPHAGE-XR) 500 MG 24 hr tablet TAKE TWO TABLETS BY MOUTH TWICE DAILY  120 tablet  11  . Multiple Vitamins-Minerals (CENTRUM ULTRA WOMENS) TABS Take by mouth daily.        . multivitamin (METANX) 3-35-2 MG TABS tablet TAKE ONE TABLET BY MOUTH EVERY DAY  90 tablet  3  . ondansetron (ZOFRAN) 4 MG tablet Take 1 tablet (4 mg total) by mouth every 8 (eight)  hours as needed for nausea.  30 tablet  0  . pyridOXINE (VITAMIN B-6) 100 MG tablet Take 200 mg by mouth daily.       . rosuvastatin (CRESTOR) 5 MG tablet Take 1 tablet (5 mg total) by mouth daily.  30 tablet  11  . apixaban (ELIQUIS) 5 MG TABS tablet Take 1 tablet (5 mg total) by mouth 2 (two) times daily.  60 tablet  6    Allergies  Allergen Reactions  . Amoxicillin     GI upset, pt says lower doses are ok.patient can take low dose  . Codeine Sulfate     REACTION: GI upset  . Sulfonamide Derivatives     REACTION: GI upset    Past Medical History  Diagnosis Date  . HYPOTHYROIDISM 07/24/2008  . HYPERLIPIDEMIA 07/24/2008  . DEPRESSION 05/16/2009  . HYPERTENSION 07/24/2008  . Vertigo    . Breast cancer   . Diabetes mellitus type II   . Arthritis     r tkr  . CVA (cerebral vascular accident) 2008    mini stroke.no residual  . Heart murmur     stress test 2009.dr Melana Hingle [place]  . Intermittent vertigo   . Skin abnormality     facial lesions .pt applying mupiracin to areas  . Atrial fibrillation     Past Surgical History  Procedure Date  . Knee surgery 2009    TKR  . Cholecystectomy 1980  . Joint replacement     r knee  . Breast surgery 2007    Lumpectomy, XRT 2006.l breast  . Video assisted thoracoscopy 04/22/2011    Procedure: VIDEO ASSISTED THORACOSCOPY;  Surgeon: Melrose Nakayama, MD;  Location: [facility];  Service: Thoracic;  Laterality: Left;  WITH BIOPSY    History  Smoking status  . Former Smoker -- 0.5 packs/day for 12 years  . Types: Cigarettes  . Quit date: 06/04/1982  Smokeless tobacco  . Not on file    History  Alcohol Use No    Family History  Problem Relation Age of Onset  . Arthritis Mother   . Rheum arthritis Mother   . Heart disease Father   . Coronary artery disease Father   . Cancer Sister     breast CA, both sisters  . Breast cancer Sister   . Coronary artery disease Brother   . Lung cancer Brother     Review of Systems: The review of systems is positive for vertigo. She does have a history of hypothyroidism and is on thyroid replacement. The last TSH I can find was in December of 2012. All other systems were reviewed and are negative.  Physical Exam: BP 145/90  Pulse 68  Ht 5' 7" (1.702 m)  Wt 213 lb 3.2 oz (96.707 kg)  BMI 33.39 kg/m2 She is a pleasant, elderly, obese white female in no acute distress. She is seen in a wheelchair. She is normocephalic, atraumatic. Pupils are equal round and reactive to light accommodation. Extraocular movements are full. Sclera are clear. Oropharynx is clear. Neck is supple without JVD, adenopathy, thyromegaly, or bruits. Lungs are clear. Cardiac exam reveals an irregular rate and  rhythm without gallop, murmur, or click. Abdomen is obese, soft, and nontender. No masses or bruits. Extremities reveal 1+ pretibial edema. Pedal pulses are good. She is alert oriented x3. Cranial nerves II through XII are intact. Mood is appropriate. LABORATORY DATA: ECG was reviewed and demonstrates atrial fibrillation with a controlled ventricular response of 68 beats per minute.  Cannot rule out septal infarct age undetermined.  Assessment / Plan: 1. Atrial fibrillation. Persistent. Duration is unknown. Patient is minimally symptomatic. Rate is well controlled on bystolic. We will obtain thyroid function studies today. We'll schedule her for an echocardiogram. We will continue with her current rate control. She has a high [place] score so we will start anticoagulation with Eliquis 5 mg twice a day. I will followup again in 2 months.  2. Hypertension.  3. History of TIA. Next  4. Hypercholesterolemia.  5. Diabetes mellitus type 2.  6. Pulmonary infiltrates most consistent with pneumonia. Actively followed by pulmonary. Repeat CT scan scheduled in December.

## 2012-02-16 NOTE — Patient Instructions (Signed)
We will schedule you for an echocardiogram and check your thyroid function.  Stop taking ASA  Start Eliquis 5 mg twice a day.  I will see you again in 2 months.

## 2012-02-18 ENCOUNTER — Other Ambulatory Visit: Payer: Self-pay

## 2012-02-18 MED ORDER — APIXABAN 5 MG PO TABS: 5.0000 mg | ORAL_TABLET | Freq: Two times a day (BID) | ORAL | Status: DC

## 2012-02-19 ENCOUNTER — Telehealth: Payer: Self-pay | Admitting: Cardiology

## 2012-02-19 NOTE — Telephone Encounter (Signed)
New Problem:    Patient called in because she is having an adverse reaction to her apixaban (ELIQUIS) 5 MG TABS tablet.  It is making her nauseous and giving her diarrhea.  Please call back.

## 2012-02-19 NOTE — Telephone Encounter (Signed)
Spoke to patient she stated for the past 2 days she has had diarrhea,nausea.States she thinks eliquis is causing.Advised to continue eliquis today.Dr.Jordan not in office,will check with him tomorrow 02/20/12 and call her back.

## 2012-02-20 ENCOUNTER — Ambulatory Visit (INDEPENDENT_AMBULATORY_CARE_PROVIDER_SITE_OTHER): Payer: Medicare Other | Admitting: Internal Medicine

## 2012-02-20 ENCOUNTER — Encounter: Payer: Self-pay | Admitting: Internal Medicine

## 2012-02-20 VITALS — BP 160/80 | HR 72 | Temp 97.7°F | Resp 18 | Wt 213.0 lb

## 2012-02-20 DIAGNOSIS — R110 Nausea: Secondary | ICD-10-CM

## 2012-02-20 DIAGNOSIS — R109 Unspecified abdominal pain: Secondary | ICD-10-CM | POA: Diagnosis not present

## 2012-02-20 DIAGNOSIS — E119 Type 2 diabetes mellitus without complications: Secondary | ICD-10-CM

## 2012-02-20 DIAGNOSIS — I10 Essential (primary) hypertension: Secondary | ICD-10-CM

## 2012-02-20 DIAGNOSIS — E871 Hypo-osmolality and hyponatremia: Secondary | ICD-10-CM | POA: Diagnosis not present

## 2012-02-20 DIAGNOSIS — I4891 Unspecified atrial fibrillation: Secondary | ICD-10-CM

## 2012-02-20 LAB — HEMOGLOBIN A1C: Hgb A1c MFr Bld: 7.2 % — ABNORMAL HIGH (ref 4.6–6.5)

## 2012-02-20 LAB — COMPREHENSIVE METABOLIC PANEL
Albumin: 4.3 g/dL (ref 3.5–5.2)
BUN: 12 mg/dL (ref 6–23)
CO2: 27 mEq/L (ref 19–32)
GFR: 50.59 mL/min — ABNORMAL LOW (ref >60.00)
Glucose, Bld: 167 mg/dL — ABNORMAL HIGH (ref 70–99)
Potassium: 4.5 mEq/L (ref 3.5–5.1)
Sodium: 132 mEq/L — ABNORMAL LOW (ref 135–145)
Total Protein: 8.3 g/dL (ref 6.0–8.3)

## 2012-02-20 LAB — CBC WITH DIFFERENTIAL/PLATELET
Basophils Absolute: 0 10*3/uL (ref 0.0–0.1)
Lymphocytes Relative: 23.7 % (ref 12.0–46.0)
Lymphs Abs: 1.9 10*3/uL (ref 0.7–4.0)
Monocytes Relative: 10.3 % (ref 3.0–12.0)
Neutrophils Relative %: 64.3 % (ref 43.0–77.0)
Platelets: 280 10*3/uL (ref 150.0–400.0)
RDW: 13.3 % (ref 11.5–14.6)

## 2012-02-20 MED ORDER — PROMETHAZINE HCL 25 MG PO TABS: 25.0000 mg | ORAL_TABLET | Freq: Three times a day (TID) | ORAL | Status: DC | PRN

## 2012-02-20 MED ORDER — PROMETHAZINE HCL 50 MG/ML IJ SOLN
50.0000 mg | Freq: Four times a day (QID) | INTRAMUSCULAR | Status: DC | PRN
  Administered 2012-02-20: 50 mg via INTRAMUSCULAR

## 2012-02-20 NOTE — Assessment & Plan Note (Signed)
Referral is made to Dr. Jordan/cardiology who has seen her before

## 2012-02-20 NOTE — Telephone Encounter (Signed)
Patient called was told spoke to Dr.Jordan he advised stop eliquis for 2 days then restart and if symtoms return he will have to prescribe a alternative medication.Advised to call back on Tuesday 02/24/12 to report her symtoms.

## 2012-02-20 NOTE — Assessment & Plan Note (Signed)
She is off of prednisone for observation.

## 2012-02-20 NOTE — Progress Notes (Signed)
Subjective:    Patient ID: [name] [name], female    DOB: 1932/09/02, 78 y.o.   MRN: [id number]  HPI  78 year old patient who has multiple medical problems. She presents today with a chief complaint of abdominal pain that began yesterday in one week history of anorexia nausea and some low-grade low-volume diarrhea. Her chief complaint at the present time his mid abdominal pain and nausea. There's been no active emesis. She has seen cardiology recently for atrial fibrillation of fairly recent onset. She has had issues with abdominal pain nausea and vomiting with hyponatremia in the past. She has been using Zofran with little benefit she has type 2 diabetes hypertension and a history of hypothyroidism. Denies any fever.  Past Medical History  Diagnosis Date  . HYPOTHYROIDISM 07/24/2008  . HYPERLIPIDEMIA 07/24/2008  . DEPRESSION 05/16/2009  . HYPERTENSION 07/24/2008  . Vertigo   . Breast cancer   . Diabetes mellitus type II   . Arthritis     r tkr  . CVA (cerebral vascular accident) 2008    mini stroke.no residual  . Heart murmur     stress test 2009.dr Lavera Vandermeer [place]  . Intermittent vertigo   . Skin abnormality     facial lesions .pt applying mupiracin to areas  . Atrial fibrillation     History   Social History  . Marital Status: Married    Spouse Name: N/A    Number of Children: N/A  . Years of Education: N/A   Occupational History  . Not on file.   Social History Main Topics  . Smoking status: Former Smoker -- 0.5 packs/day for 12 years    Types: Cigarettes    Quit date: 06/04/1982  . Smokeless tobacco: Not on file  . Alcohol Use: No  . Drug Use: Not on file  . Sexually Active: Yes    Birth Control/ Protection: Post-menopausal   Other Topics Concern  . Not on file   Social History Narrative  . No narrative on file    Past Surgical History  Procedure Date  . Knee surgery 2009    TKR  . Cholecystectomy 1980  . Joint replacement     r knee  . Breast surgery 2007    Lumpectomy, XRT 2006.l breast  . Video assisted thoracoscopy 04/22/2011    Procedure: VIDEO ASSISTED THORACOSCOPY;  Surgeon: Melrose Nakayama, MD;  Location: [facility] OR;  Service: Thoracic;  Laterality: Left;  WITH BIOPSY    Family History  Problem Relation Age of Onset  . Arthritis Mother   . Rheum arthritis Mother   . Heart disease Father   . Coronary artery disease Father   . Cancer Sister     breast CA, both sisters  . Breast cancer Sister   . Coronary artery disease Brother   . Lung cancer Brother     Allergies  Allergen Reactions  . Amoxicillin     GI upset, pt says lower doses are ok.patient can take low dose  . Codeine Sulfate     REACTION: GI upset  . Sulfonamide Derivatives     REACTION: GI upset    Current Outpatient Prescriptions on File Prior to Visit  Medication Sig Dispense Refill  . amLODipine-olmesartan (AZOR) 5-40 MG per tablet Take 1 tablet by mouth daily.  30 tablet  5  . apixaban (ELIQUIS) 5 MG TABS tablet Take 1 tablet (5 mg total) by mouth 2 (two) times daily.  60 tablet  0  . BYSTOLIC 10 MG tablet  TAKE ONE TABLET BY MOUTH EVERY DAY  90 each  0  . CALCIUM CITRATE PO Take 1,200 mg by mouth daily.        . cholecalciferol (VITAMIN D) 1000 UNITS tablet Take 3,000 Units by mouth daily.        . cyanocobalamin 100 MCG tablet Take 100 mcg by mouth daily.      . diazepam (VALIUM) 5 MG tablet TAKE ONE TABLET BY MOUTH ONCE DAILY AS NEEDED  30 tablet  0  . FeFum-FePo-FA-B Cmp-C-Zn-Mn-Cu (SE-TAN PLUS) 162-115.2-1 MG CAPS TAKE ONE CAPSULE BY MOUTH EVERY DAY  30 capsule  11  . fluticasone (FLONASE) 50 MCG/ACT nasal spray Place 2 sprays into the nose daily as needed. For stuffy nose.  16 g  11  . Fluticasone-Salmeterol (ADVAIR DISKUS) 250-50 MCG/DOSE AEPB Inhale 1 puff into the lungs 2 (two) times daily.  60 each  5  . furosemide (LASIX) 20 MG tablet Take 1 tablet (20 mg total) by mouth daily.  30 tablet  0  . glipiZIDE (GLUCOTROL) 10 MG tablet TAKE ONE TABLET BY  MOUTH TWICE DAILY  180 tablet  0  . levothyroxine (LEVOTHROID) 125 MCG tablet Take 1 tablet (125 mcg total) by mouth daily.  30 tablet  11  . loratadine (CLARITIN) 10 MG tablet Take 10 mg by mouth daily.      . metFORMIN (GLUCOPHAGE-XR) 500 MG 24 hr tablet TAKE TWO TABLETS BY MOUTH TWICE DAILY  120 tablet  11  . Multiple Vitamins-Minerals (CENTRUM ULTRA WOMENS) TABS Take by mouth daily.        . ondansetron (ZOFRAN) 4 MG tablet Take 1 tablet (4 mg total) by mouth every 8 (eight) hours as needed for nausea.  30 tablet  0  . pyridOXINE (VITAMIN B-6) 100 MG tablet Take 200 mg by mouth daily.       . rosuvastatin (CRESTOR) 5 MG tablet Take 1 tablet (5 mg total) by mouth daily.  30 tablet  11  . multivitamin (METANX) 3-35-2 MG TABS tablet TAKE ONE TABLET BY MOUTH EVERY DAY  90 tablet  3    BP 160/80  Pulse 72  Temp 97.7 F (36.5 C) (Oral)  Resp 18  Wt 213 lb (96.616 kg)  SpO2 95%       Review of Systems  Constitutional: Negative.  Negative for fever.  HENT: Negative for hearing loss, congestion, sore throat, rhinorrhea, dental problem, sinus pressure and tinnitus.   Eyes: Negative for pain, discharge and visual disturbance.  Respiratory: Negative for cough and shortness of breath.   Cardiovascular: Negative for chest pain, palpitations and leg swelling.  Gastrointestinal: Positive for nausea, abdominal pain and diarrhea. Negative for vomiting, constipation, blood in stool and abdominal distention.  Genitourinary: Negative for dysuria, urgency, frequency, hematuria, flank pain, vaginal bleeding, vaginal discharge, difficulty urinating, vaginal pain and pelvic pain.  Musculoskeletal: Negative for joint swelling, arthralgias and gait problem.  Skin: Negative for rash.  Neurological: Negative for dizziness, syncope, speech difficulty, weakness, numbness and headaches.  Hematological: Negative for adenopathy.  Psychiatric/Behavioral: Negative for behavioral problems, dysphoric mood and  agitation. The patient is not nervous/anxious.        Objective:   Physical Exam  Constitutional: She is oriented to person, place, and time. She appears well-developed and well-nourished.       Wheelchair bound. No distress. Afebrile O2 saturation 95%  HENT:  Head: Normocephalic.  Right Ear: External ear normal.  Left Ear: External ear normal.  Mouth/Throat: Oropharynx is  clear and moist.       Appears well-hydrated  Eyes: Conjunctivae normal and EOM are normal. Pupils are equal, round, and reactive to light.  Neck: Normal range of motion. Neck supple. No thyromegaly present.  Cardiovascular: Normal rate, normal heart sounds and intact distal pulses.        Irregular rhythm with a controlled ventricular response  Pulmonary/Chest: Effort normal. She has rales.       Few crackles right base  Abdominal: Soft. Bowel sounds are normal. She exhibits no distension and no mass. There is no tenderness. There is no rebound and no guarding.  Musculoskeletal: Normal range of motion.  Lymphadenopathy:    She has no cervical adenopathy.  Neurological: She is alert and oriented to person, place, and time.  Skin: Skin is warm and dry. No rash noted.  Psychiatric: She has a normal mood and affect. Her behavior is normal.          Assessment & Plan:   Abdominal pain with nausea. This has been a problem in the past and diabetic gastroparesis has been considered. Clinical exam is unremarkable will check electrolytes CBC and treat symptomatically Diabetes. We'll check a hemoglobin A1c Hypertension stable Atrial fibrillation new onset. Continue anticoagulation therapy. Nice rate control at present

## 2012-02-20 NOTE — Patient Instructions (Signed)
Drink clear liquids only for the next 24 hours,  then slowly add other liquids and food as you  tolerate them  Take medicines for nausea as described  Call or return to clinic prn if these symptoms worsen or fail to improve as anticipated.

## 2012-02-23 ENCOUNTER — Ambulatory Visit (HOSPITAL_COMMUNITY): Payer: Medicare Other | Attending: Cardiology

## 2012-02-23 DIAGNOSIS — E785 Hyperlipidemia, unspecified: Secondary | ICD-10-CM | POA: Diagnosis not present

## 2012-02-23 DIAGNOSIS — I059 Rheumatic mitral valve disease, unspecified: Secondary | ICD-10-CM | POA: Insufficient documentation

## 2012-02-23 DIAGNOSIS — Z8673 Personal history of transient ischemic attack (TIA), and cerebral infarction without residual deficits: Secondary | ICD-10-CM | POA: Insufficient documentation

## 2012-02-23 DIAGNOSIS — R0989 Other specified symptoms and signs involving the circulatory and respiratory systems: Secondary | ICD-10-CM | POA: Insufficient documentation

## 2012-02-23 DIAGNOSIS — I517 Cardiomegaly: Secondary | ICD-10-CM | POA: Insufficient documentation

## 2012-02-23 DIAGNOSIS — I4891 Unspecified atrial fibrillation: Secondary | ICD-10-CM

## 2012-02-23 DIAGNOSIS — I10 Essential (primary) hypertension: Secondary | ICD-10-CM | POA: Diagnosis not present

## 2012-02-23 DIAGNOSIS — E119 Type 2 diabetes mellitus without complications: Secondary | ICD-10-CM | POA: Diagnosis not present

## 2012-02-23 DIAGNOSIS — R0609 Other forms of dyspnea: Secondary | ICD-10-CM | POA: Diagnosis not present

## 2012-02-23 DIAGNOSIS — C50919 Malignant neoplasm of unspecified site of unspecified female breast: Secondary | ICD-10-CM | POA: Diagnosis not present

## 2012-02-23 NOTE — Progress Notes (Signed)
Echocardiogram performed.  

## 2012-02-26 NOTE — Telephone Encounter (Signed)
Patient called 02/25/12 stated she started back on eliquis.States she held it for 2 days but could not see a difference she still had nausea.Patient advised needs to see PCP.

## 2012-02-27 ENCOUNTER — Other Ambulatory Visit: Payer: Self-pay | Admitting: Family Medicine

## 2012-03-07 ENCOUNTER — Other Ambulatory Visit: Payer: Self-pay | Admitting: Family Medicine

## 2012-03-09 ENCOUNTER — Encounter: Payer: Medicare Other | Admitting: Cardiology

## 2012-03-10 ENCOUNTER — Other Ambulatory Visit: Payer: Self-pay

## 2012-03-10 MED ORDER — APIXABAN 5 MG PO TABS: 5.0000 mg | ORAL_TABLET | Freq: Two times a day (BID) | ORAL | Status: DC

## 2012-03-15 ENCOUNTER — Telehealth: Payer: Self-pay | Admitting: Cardiology

## 2012-03-15 NOTE — Telephone Encounter (Signed)
Spoke to patient she stated she has had itching all over for 2 weeks.States she thinks eliquis is causing.States she started on eliquis 02/17/12.Patient was told Dr.Jordan not in office today.Will check with him tomorrow 03/16/12 and call her back.

## 2012-03-15 NOTE — Telephone Encounter (Signed)
plz return call to pt at [phone] regarding possible med side affects of Eliquis

## 2012-03-17 ENCOUNTER — Telehealth: Payer: Self-pay | Admitting: Cardiology

## 2012-03-17 NOTE — Telephone Encounter (Signed)
New problem:    Samples of eliquis 5 mg.

## 2012-03-17 NOTE — Telephone Encounter (Signed)
Patient called was told spoke to [place] he advised to continue Eliquis.Patient stated she is still itching some.States when she scratches she breaks out with pinkish bumps,does not have a rash.Stated she called her PCP,  he advised to take plain claritin.States will see her dermatologist if not better.Advised to call back if needed.

## 2012-03-18 NOTE — Telephone Encounter (Signed)
Patient called samples of eliquis 5 mg left at front desk 3rd floor.

## 2012-03-22 ENCOUNTER — Ambulatory Visit (INDEPENDENT_AMBULATORY_CARE_PROVIDER_SITE_OTHER): Payer: Medicare Other | Admitting: Family Medicine

## 2012-03-22 ENCOUNTER — Encounter: Payer: Self-pay | Admitting: Family Medicine

## 2012-03-22 VITALS — BP 130/80 | HR 60 | Wt 204.0 lb

## 2012-03-22 DIAGNOSIS — L299 Pruritus, unspecified: Secondary | ICD-10-CM | POA: Diagnosis not present

## 2012-03-22 MED ORDER — HYDROXYZINE HCL 50 MG PO TABS: 50.0000 mg | ORAL_TABLET | Freq: Three times a day (TID) | ORAL | Status: DC | PRN

## 2012-03-22 MED ORDER — TRIAMCINOLONE ACETONIDE 0.1 % EX CREA: TOPICAL_CREAM | Freq: Two times a day (BID) | CUTANEOUS | Status: DC

## 2012-03-22 NOTE — Progress Notes (Signed)
  Subjective:    Patient ID: [name] [name], female    DOB: 07-13-1932, 78 y.o.   MRN: [id number]  HPI  Chief complaint of generalized itching. Patient recently diagnosed with atrial fibrillation and started on Eliquis but she states itching pre-dated that. She has not really seen any rash but does have a few scattered excoriations. Recent echocardiogram reportedly normal per patient. Our computer system is currently down. Denies chest pain. She has chronic dyspnea which is unchanged.  Her pruritus is generalized has been present for about 3 weeks. Benadryl helps minimally. No new soaps or detergents. She's tried a couple of lotions without much improvement. Denies any fever or chills   Review of Systems  Constitutional: Negative for fever and chills.  HENT: Negative for sore throat.   Respiratory: Positive for shortness of breath. Negative for cough.   Cardiovascular: Negative for chest pain and leg swelling.  Hematological: Negative for adenopathy.       Objective:   Physical Exam  Constitutional: She appears well-developed and well-nourished.  Cardiovascular: Normal rate.        Slightly irregular rhythm  Pulmonary/Chest: Effort normal and breath sounds normal. No respiratory distress. She has no wheezes. She has no rales.  Musculoskeletal: She exhibits no edema.  Skin:       Few scattered excoriations upper abdomen otherwise no rash noted. No scaling. Skin does not seem particularly dry          Assessment & Plan:  Generalized pruritus. Trial Atarax 50 mg each bedtime. Triamcinolone and Eucerin cream one to one ratio and applied twice daily as needed. Our computer system was down today. If symptoms persist check some basic screening labs

## 2012-03-23 ENCOUNTER — Ambulatory Visit (INDEPENDENT_AMBULATORY_CARE_PROVIDER_SITE_OTHER): Payer: Medicare Other | Admitting: Family

## 2012-03-23 ENCOUNTER — Encounter: Payer: Self-pay | Admitting: Family

## 2012-03-23 ENCOUNTER — Telehealth: Payer: Self-pay | Admitting: Family Medicine

## 2012-03-23 VITALS — BP 136/92 | HR 68 | Temp 97.7°F

## 2012-03-23 DIAGNOSIS — T7840XA Allergy, unspecified, initial encounter: Secondary | ICD-10-CM

## 2012-03-23 DIAGNOSIS — Z888 Allergy status to other drugs, medicaments and biological substances status: Secondary | ICD-10-CM

## 2012-03-23 DIAGNOSIS — L299 Pruritus, unspecified: Secondary | ICD-10-CM | POA: Diagnosis not present

## 2012-03-23 MED ORDER — ONDANSETRON HCL 4 MG PO TABS: 4.0000 mg | ORAL_TABLET | Freq: Three times a day (TID) | ORAL | Status: DC | PRN

## 2012-03-23 NOTE — Telephone Encounter (Signed)
Patient Information:  Caller Name: Jailah  Phone: [phone]  Patient: [name] [name], [name] [name]  Gender: Female  DOB: 01-03-1933  Age: 78 Years  PCP: Carolann Littler (Family Practice)  Office Follow Up:  Does the office need to follow up with this patient?: No  Instructions For The Office: N/A  RN Note:  Pt was prescribed Atarax yesterday for itching and rash that she has. She reports that she took it around 8pm and shortly after she started having nausea and abdominal aching. She denies that it is pain to her but an annoying ache across the middle of her stomach. Appointment scheduled for today with Roxy Cedar, PA.  Symptoms  Reason For Call & Symptoms: nausea and stomach ache after taking Atarax  Reviewed Health History In EMR: Yes  Reviewed Medications In EMR: Yes  Reviewed Allergies In EMR: Yes  Reviewed Surgeries / Procedures: Yes  Date of Onset of Symptoms: 03/22/2012  Guideline(s) Used:  Abdominal Pain - Female  Disposition Per Guideline:   See Today in Office  Reason For Disposition Reached:   Age > 60 years  Advice Given:  Call Back If:  You become worse.  Appointment Scheduled:  03/23/2012 15:00:00 Appointment Scheduled Provider:  Roxy Cedar [facility])

## 2012-03-23 NOTE — Patient Instructions (Addendum)
Pruritus  Pruritis is an itch. There are many different problems that can cause an itch. Dry skin is one of the most common causes of itching. Most cases of itching do not require medical attention.  HOME CARE INSTRUCTIONS  Make sure your skin is moistened on a regular basis. A moisturizer that contains petroleum jelly is best for keeping moisture in your skin. If you develop a rash, you may try the following for relief:   Use corticosteroid cream.  Apply cool compresses to the affected areas.  Bathe with Epsom salts or baking soda in the bathwater.  Soak in colloidal oatmeal baths. These are available at your pharmacy.  Apply baking soda paste to the rash. Stir water into baking soda until it reaches a paste-like consistency.  Use an anti-itch lotion.  Take over-the-counter diphenhydramine medicine by mouth as the instructions direct.  Avoid scratching. Scratching may cause the rash to become infected. If itching is very bad, your caregiver may suggest prescription lotions or creams to lessen your symptoms.  Avoid hot showers, which can make itching worse. A cold shower may help with itching as long as you use a moisturizer after the shower. SEEK MEDICAL CARE IF: The itching does not go away after several days. Document Released: 12/04/2010 Document Revised: 06/16/2011 Document Reviewed: 12/04/2010 [facility] Patient Information 2013 [place].

## 2012-03-24 NOTE — Progress Notes (Signed)
Subjective:    Patient ID: [name] [name], female    DOB: [birth year], 78 y.o.   MRN: [id number]  HPI 78 year old white female, nonsmoker, patient of Dr. Elease Hashimoto is in today with complaints of her reactions to Antivert that's caused a rash and made her feel loopy after taking one dose. She saw Dr. Elease Hashimoto who diagnosed her with generalized pruritus and started Antivert and Eucerin and triamcinolone cream. She's tolerating the triamcinolone cream well. Rep she denies any shortness of breath or chest pain related to taking the Antivert. Recently started on Eliquis but itching began prior to taking Eliquis. Patient also reports using [place] with [place] Citron which was new, but has discontinued that as well. Has had nausea but denies any vomiting.   Review of Systems  Constitutional: Negative.   Respiratory: Negative.   Cardiovascular: Negative.   Gastrointestinal: Negative.   Musculoskeletal: Negative.   Skin: Positive for rash.  Hematological: Negative.   Psychiatric/Behavioral: Negative.    Past Medical History  Diagnosis Date  . HYPOTHYROIDISM 07/24/2008  . HYPERLIPIDEMIA 07/24/2008  . DEPRESSION 05/16/2009  . HYPERTENSION 07/24/2008  . Vertigo   . Breast cancer   . Diabetes mellitus type II   . Arthritis     r tkr  . CVA (cerebral vascular accident) 2008    mini stroke.no residual  . Heart murmur     stress test 2009.dr peter [place]  . Intermittent vertigo   . Skin abnormality     facial lesions .pt applying mupiracin to areas  . Atrial fibrillation     History   Social History  . Marital Status: Married    Spouse Name: N/A    Number of Children: N/A  . Years of Education: N/A   Occupational History  . Not on file.   Social History Main Topics  . Smoking status: Former Smoker -- 0.5 packs/day for 12 years    Types: Cigarettes    Quit date: 06/04/1982  . Smokeless tobacco: Not on file  . Alcohol Use: No  . Drug Use: Not on file  . Sexually Active: Yes    Birth  Control/ Protection: Post-menopausal   Other Topics Concern  . Not on file   Social History Narrative  . No narrative on file    Past Surgical History  Procedure Date  . Knee surgery 2009    TKR  . Cholecystectomy 1980  . Joint replacement     r knee  . Breast surgery 2007    Lumpectomy, XRT 2006.l breast  . Video assisted thoracoscopy 04/22/2011    Procedure: VIDEO ASSISTED THORACOSCOPY;  Surgeon: Melrose Nakayama, MD;  Location: [facility] OR;  Service: Thoracic;  Laterality: Left;  WITH BIOPSY    Family History  Problem Relation Age of Onset  . Arthritis Mother   . Rheum arthritis Mother   . Heart disease Father   . Coronary artery disease Father   . Cancer Sister     breast CA, both sisters  . Breast cancer Sister   . Coronary artery disease Brother   . Lung cancer Brother     Allergies  Allergen Reactions  . Amoxicillin     GI upset, pt says lower doses are ok.patient can take low dose  . Codeine Sulfate     REACTION: GI upset  . Sulfonamide Derivatives     REACTION: GI upset  . Atarax (Hydroxyzine) Nausea Only and Rash    Current Outpatient Prescriptions on File Prior to Visit  Medication Sig  Dispense Refill  . apixaban (ELIQUIS) 5 MG TABS tablet Take 1 tablet (5 mg total) by mouth 2 (two) times daily.  60 tablet  2  . AZOR 5-40 MG per tablet TAKE ONE TABLET BY MOUTH EVERY DAY  90 tablet  3  . BYSTOLIC 10 MG tablet TAKE ONE TABLET BY MOUTH EVERY DAY  90 tablet  3  . CALCIUM CITRATE PO Take 1,200 mg by mouth daily.        . cholecalciferol (VITAMIN D) 1000 UNITS tablet Take 3,000 Units by mouth daily.        . cyanocobalamin 100 MCG tablet Take 100 mcg by mouth daily.      . diazepam (VALIUM) 5 MG tablet TAKE ONE TABLET BY MOUTH ONCE DAILY AS NEEDED  30 tablet  0  . FeFum-FePo-FA-B Cmp-C-Zn-Mn-Cu (SE-TAN PLUS) 162-115.2-1 MG CAPS TAKE ONE CAPSULE BY MOUTH EVERY DAY  30 capsule  11  . fluticasone (FLONASE) 50 MCG/ACT nasal spray Place 2 sprays into the nose  daily as needed. For stuffy nose.  16 g  11  . Fluticasone-Salmeterol (ADVAIR DISKUS) 250-50 MCG/DOSE AEPB Inhale 1 puff into the lungs 2 (two) times daily.  60 each  5  . furosemide (LASIX) 20 MG tablet Take 1 tablet (20 mg total) by mouth daily.  30 tablet  0  . glipiZIDE (GLUCOTROL) 10 MG tablet TAKE ONE TABLET BY MOUTH TWICE DAILY  180 tablet  0  . levothyroxine (LEVOTHROID) 125 MCG tablet Take 1 tablet (125 mcg total) by mouth daily.  30 tablet  11  . loratadine (CLARITIN) 10 MG tablet Take 10 mg by mouth daily.      . metFORMIN (GLUCOPHAGE-XR) 500 MG 24 hr tablet TAKE TWO TABLETS BY MOUTH TWICE DAILY  120 tablet  11  . Multiple Vitamins-Minerals (CENTRUM ULTRA WOMENS) TABS Take by mouth daily.        . multivitamin (METANX) 3-35-2 MG TABS tablet TAKE ONE TABLET BY MOUTH EVERY DAY  90 tablet  3  . promethazine (PHENERGAN) 25 MG tablet Take 1 tablet (25 mg total) by mouth every 8 (eight) hours as needed for nausea.  20 tablet  0  . pyridOXINE (VITAMIN B-6) 100 MG tablet Take 200 mg by mouth daily.       . rosuvastatin (CRESTOR) 5 MG tablet Take 1 tablet (5 mg total) by mouth daily.  30 tablet  11  . triamcinolone cream (KENALOG) 0.1 % Apply topically 2 (two) times daily. Compound 1:1 with Eucerin cream.  454 g  0  . hydrOXYzine (ATARAX/VISTARIL) 50 MG tablet Take 1 tablet (50 mg total) by mouth every 8 (eight) hours as needed for itching.  30 tablet  1    BP 136/92  Pulse 68  Temp 97.7 F (36.5 C) (Oral)  SpO2 97%chart    Objective:   Physical Exam  Constitutional: She is oriented to person, place, and time. She appears well-developed and well-nourished.  Cardiovascular: Normal rate, regular rhythm and normal heart sounds.   Pulmonary/Chest: Effort normal and breath sounds normal.  Abdominal: Soft. Bowel sounds are normal.  Neurological: She is alert and oriented to person, place, and time.  Skin: Rash noted. There is erythema.       Excoriated rash, papular and red to the chest,  shoulders and back  Psychiatric: She has a normal mood and affect.          Assessment & Plan:  Assessment: Allergic reaction-medication, Rash  Plan: Continue triamcinolone and Eucerin  mix. DC Antivert. Zofran as needed for nausea. Patient call the office if symptoms worsen or persist. Recheck a schedule, and when necessary.

## 2012-03-26 ENCOUNTER — Encounter (HOSPITAL_COMMUNITY): Payer: Self-pay

## 2012-03-26 ENCOUNTER — Other Ambulatory Visit: Payer: Medicare Other | Admitting: Lab

## 2012-03-26 ENCOUNTER — Ambulatory Visit (HOSPITAL_COMMUNITY)
Admission: RE | Admit: 2012-03-26 | Discharge: 2012-03-26 | Disposition: A | Discharge status: Home / Self Care | Payer: Medicare Other | Source: Ambulatory Visit | Attending: Oncology | Admitting: Oncology

## 2012-03-26 DIAGNOSIS — D3A00 Benign carcinoid tumor of unspecified site: Secondary | ICD-10-CM

## 2012-03-26 DIAGNOSIS — Z9221 Personal history of antineoplastic chemotherapy: Secondary | ICD-10-CM | POA: Diagnosis not present

## 2012-03-26 DIAGNOSIS — E559 Vitamin D deficiency, unspecified: Secondary | ICD-10-CM | POA: Diagnosis not present

## 2012-03-26 DIAGNOSIS — C7A029 Malignant carcinoid tumor of the large intestine, unspecified portion: Secondary | ICD-10-CM | POA: Diagnosis not present

## 2012-03-26 DIAGNOSIS — Z923 Personal history of irradiation: Secondary | ICD-10-CM | POA: Diagnosis not present

## 2012-03-26 DIAGNOSIS — C50919 Malignant neoplasm of unspecified site of unspecified female breast: Secondary | ICD-10-CM | POA: Insufficient documentation

## 2012-03-26 DIAGNOSIS — I2510 Atherosclerotic heart disease of native coronary artery without angina pectoris: Secondary | ICD-10-CM | POA: Diagnosis not present

## 2012-03-26 DIAGNOSIS — I517 Cardiomegaly: Secondary | ICD-10-CM | POA: Diagnosis not present

## 2012-03-26 DIAGNOSIS — Z853 Personal history of malignant neoplasm of breast: Secondary | ICD-10-CM | POA: Diagnosis not present

## 2012-03-26 LAB — COMPREHENSIVE METABOLIC PANEL (CC13)
Albumin: 3.6 g/dL (ref 3.5–5.0)
Alkaline Phosphatase: 48 U/L (ref 40–150)
CO2: 27 mEq/L (ref 22–29)
Chloride: 99 mEq/L (ref 98–107)
Glucose: 142 mg/dl — ABNORMAL HIGH (ref 70–99)
Potassium: 4.1 mEq/L (ref 3.5–5.1)
Sodium: 132 mEq/L — ABNORMAL LOW (ref 136–145)
Total Protein: 7.8 g/dL (ref 6.4–8.3)

## 2012-03-26 LAB — CBC WITH DIFFERENTIAL/PLATELET
Basophils Absolute: 0 10*3/uL (ref 0.0–0.1)
Eosinophils Absolute: 0.2 10*3/uL (ref 0.0–0.5)
HGB: 10.4 g/dL — ABNORMAL LOW (ref 11.6–15.9)
MONO#: 0.7 10*3/uL (ref 0.1–0.9)
NEUT#: 2.9 10*3/uL (ref 1.5–6.5)
RDW: 13.9 % (ref 11.2–14.5)
lymph#: 1.8 10*3/uL (ref 0.9–3.3)

## 2012-03-26 MED ORDER — IOHEXOL 300 MG/ML  SOLN
80.0000 mL | Freq: Once | INTRAMUSCULAR | Status: AC | PRN
  Administered 2012-03-26: 80 mL via INTRAVENOUS

## 2012-03-27 ENCOUNTER — Telehealth: Payer: Self-pay | Admitting: *Deleted

## 2012-03-27 NOTE — Telephone Encounter (Signed)
patient confirmed over the phone the same day just a different time at 2:30pm

## 2012-04-01 ENCOUNTER — Encounter: Payer: Self-pay | Admitting: Cardiology

## 2012-04-02 ENCOUNTER — Ambulatory Visit: Payer: Medicare Other | Admitting: Oncology

## 2012-04-02 ENCOUNTER — Ambulatory Visit (HOSPITAL_BASED_OUTPATIENT_CLINIC_OR_DEPARTMENT_OTHER): Payer: Medicare Other | Admitting: Oncology

## 2012-04-02 ENCOUNTER — Telehealth: Payer: Self-pay | Admitting: *Deleted

## 2012-04-02 VITALS — BP 126/77 | HR 69 | Temp 98.1°F | Resp 20 | Ht 67.0 in | Wt 201.7 lb

## 2012-04-02 DIAGNOSIS — C7A00 Malignant carcinoid tumor of unspecified site: Secondary | ICD-10-CM | POA: Diagnosis not present

## 2012-04-02 DIAGNOSIS — C7B8 Other secondary neuroendocrine tumors: Secondary | ICD-10-CM | POA: Diagnosis not present

## 2012-04-02 DIAGNOSIS — C7A029 Malignant carcinoid tumor of the large intestine, unspecified portion: Secondary | ICD-10-CM

## 2012-04-02 DIAGNOSIS — Z853 Personal history of malignant neoplasm of breast: Secondary | ICD-10-CM | POA: Diagnosis not present

## 2012-04-02 NOTE — Progress Notes (Signed)
Hematology and Oncology Follow Up Visit  [name] [name] [id number] 02/21/1933 78 y.o. 04/02/2012 3:58 PM PCP Dr Glynn Octave Dr [name] Burchette Principle Diagnosis: History of breast cancer 2007, , currently being evaluated for possible neuroendocrine carcinoma involving lung.  Interim History:  Since being seen last [name] [name] was evaluated by her primary care doctor for possible pneumonia. She was placed on oral antibiotics. She had a chest x-ray which suggested bilateral involvement of her lungs. She was subsequently seen by a pulmonologist who in turn was concerned about Wegener's disease. She she says he referred her for possible lung biopsy. This was performed by Dr. Roxan Hockey on 04/22/2011. She has recovered reasonably well from this. She did require 5 day hospital stay as well some home O2. Currently she is off her antibiotics and feels well. She is no longer taking home O2. She is taking low-dose prednisone. Results are detailed below but essentially would appear that she does have evidence of atypical glandular cells in her pathology. Final staining suggested this is of neuroendocrine type likely of GI origin. She really does not exhibit any symptoms related to having a neuroendocrine or carcinoid type cancer.  Medications: I have reviewed the patient's current medications.  Allergies:  Allergies  Allergen Reactions  . Amoxicillin     GI upset, pt says lower doses are ok.patient can take low dose  . Codeine Sulfate     REACTION: GI upset  . Sulfonamide Derivatives     REACTION: GI upset  . Atarax (Hydroxyzine) Nausea Only and Rash    Past Medical History, Surgical history, Social history, and Family History were reviewed and updated.  Review of Systems: Constitutional:  Negative for fever, chills, night sweats, anorexia, weight loss, pain. Cardiovascular: no chest pain or dyspnea on exertion Respiratory: no cough, shortness of breath, or wheezing Neurological:  negative Dermatological: negative ENT: negative Skin Gastrointestinal: negative Genito-Urinary: negative Hematological and Lymphatic: negative Breast: negative Musculoskeletal: negative Remaining ROS negative.  Physical Exam: Blood pressure 126/77, pulse 69, temperature 98.1 F (36.7 C), temperature source Oral, resp. rate 20, height 5' 7" (1.702 m), weight 201 lb 11.2 oz (91.491 kg). ECOG:  General appearance: alert, cooperative and appears stated age Head: Normocephalic, without obvious abnormality, atraumatic Neck: no adenopathy, no carotid bruit, no JVD, supple, symmetrical, trachea midline and thyroid not enlarged, symmetric, no tenderness/mass/nodules Lymph nodes: Cervical, supraclavicular, and axillary nodes normal. Cardiac : regular rate and rhythm, no murmurs or gallops Pulmonary:clear to auscultation bilaterally and normal percussion bilaterally Breasts: inspection negative, no nipple discharge or bleeding, no masses or nodularity palpable Abdomen:normal findings: bowel sounds normal Extremities negative Neuro: alert, oriented, normal speech, no focal findings or movement disorder noted  Lab Results: Lab Results  Component Value Date   WBC 5.7 03/26/2012   HGB 10.4* 03/26/2012   HCT 32.4* 03/26/2012   MCV 92.4 03/26/2012   PLT 243 03/26/2012     Chemistry      Component Value Date/Time   NA 132* 03/26/2012 0907   NA 132* 02/20/2012 1015   NA 136 09/09/2011 0939   K 4.1 03/26/2012 0907   K 4.5 02/20/2012 1015   K 4.7 09/09/2011 0939   CL 99 03/26/2012 0907   CL 99 02/20/2012 1015   CL 95* 09/09/2011 0939   CO2 27 03/26/2012 0907   CO2 27 02/20/2012 1015   CO2 29 09/09/2011 0939   BUN 19.0 03/26/2012 0907   BUN 12 02/20/2012 1015   BUN 17 09/09/2011 0939  CREATININE 1.2* 03/26/2012 0907   CREATININE 1.1 02/20/2012 1015   CREATININE 1.0 09/09/2011 0939      Component Value Date/Time   CALCIUM 9.4 03/26/2012 0907   CALCIUM 9.4 02/20/2012 1015   CALCIUM 9.0  09/09/2011 0939   ALKPHOS 48 03/26/2012 0907   ALKPHOS 43 02/20/2012 1015   ALKPHOS 45 09/09/2011 0939   AST 23 03/26/2012 0907   AST 23 02/20/2012 1015   AST 26 09/09/2011 0939   ALT 16 03/26/2012 0907   ALT 15 02/20/2012 1015   BILITOT 0.77 03/26/2012 0907   BILITOT 0.6 02/20/2012 1015   BILITOT 0.60 09/09/2011 0939     REASON: [id number]: Further evaluation of the "focal atypical glands" reported by the outside consultant pathologist. FINAL DIAGNOSIS Diagnosis 1. Lung, wedge biopsy/resection, left upper lobe - PATCHY ORGANIZING PNEUMONIA WITH CHRONIC INTERSTITIAL PNEUMONIA, HEMOSIDERIN DEPOSITS SUGGESTIVE OF CHRONIC CONGESTION AND FOCAL ATYPICAL GLANDS. SEE COMMENT. 2. Lung, wedge biopsy/resection, left upper lobe - PATCHY ORGANIZING PNEUMONIA WITH CHRONIC INTERSTITIAL PNEUMONIA, HEMOSIDERIN DEPOSITS SUGGESTIVE OF CHRONIC CONGESTION AND FOCAL ATYPICAL GLANDS. SEE COMMENT. Microscopic Comment 1. Please see the [facility] pathology report for the entire interpretation ([id number]). ADDENDUM: The atypical glands identified represent well differentiated low-grade neuroendocrine tumor. The neuroendocrine tumor has the following immunophenotype. Cytokeratin AE1/AE3 - diffuse, strong positivity CDX2 - diffuse, strong positivity TTF1 - negative expression GCDFP - negative expression Estrogen receptor - negative expression Synaptophysin - diffuse, strong expression Overall, the immunophenotype is diagnostic of neuroendocrine tumor. The lack of TTF1 expression and strong CDX2 expression are immunophenotypically consistent with metastatic neuroendocrine tumor of gastrointestinal origin. GMS stain is negative for yeast and fungi. Correlation with radiographic imaging is strongly recommended. The case was discussed with Drs. Doristine Section, and [employer] on 05/13/2011. Case was reviewed by Dr. Lyndon Code who essentially agrees. (CR:jy) 05/14/11 1 of 2 Amended copy Addendum  FINAL for [name] [name] ([id number]) Microscopic Comment(continued) Mali RUND DO Pathologist, Electronic Signature   (Case signed .Marland Kitchen Radiological Studies: chest X-ray n/a Mammogram Due 3/14 Bone density n/a  Impression and Plan:  This is a pleasant 78 year old woman with a history of breast cancer now presenting with what sounds like a neuroendocrine or carcinoid carcinoma and involvement normals likely a GI origin. I discussed the natural history of this type of cancer. I also indicated that this was not a obvious finding and required some other input before it was apparent and so I tried to reassure her that this was likely an incidental finding. She should have evidence of pneumonia on her biopsy as well. My plan is to go ahead with some staging scans she really has a PET scan ordered for this week at, and I have added a CAT scan of the chest as well as was done 2 months ago truly look at and see if there is any changes in the infiltrates of her lungs. I've also ordered a 24-hour urine collection for 5 HIAA. I will plan to see her again in followup in 2-3 weeks to review results with her. If this is  obvious and there are new changes  we discussed that  treatment , might be indicated. although I think that given her age and other comorbid problems and maybe inclined to do a watchful waiting type approach as well.  More than 50% of the visit was spent in patient-related counselling   Eston Esters, MD 12/27/20133:58 PM

## 2012-04-02 NOTE — Telephone Encounter (Signed)
Patient confirmed in person about her getting her appointment in six months

## 2012-04-03 ENCOUNTER — Other Ambulatory Visit: Payer: Self-pay | Admitting: Family Medicine

## 2012-04-05 ENCOUNTER — Other Ambulatory Visit: Payer: Self-pay | Admitting: Family Medicine

## 2012-04-05 NOTE — Telephone Encounter (Signed)
Refill once

## 2012-04-06 ENCOUNTER — Telehealth: Payer: Self-pay

## 2012-04-06 NOTE — Telephone Encounter (Signed)
Received request for prior authorization for eliquis.[facility] [id number] called and eliquis approved for 1 year.Patient was called and told.Patient stated she cannot take eliquis makes her itch all over.States she breaks out in pinkish bumps.States she has tried everything,changed detergents,benadryl.Patient was told Dr.Jordan not in office this week will check with him next week and call her back.

## 2012-04-09 ENCOUNTER — Ambulatory Visit: Payer: Medicare Other | Admitting: Family Medicine

## 2012-04-09 ENCOUNTER — Telehealth: Payer: Self-pay | Admitting: Family Medicine

## 2012-04-09 ENCOUNTER — Encounter: Payer: Self-pay | Admitting: Family Medicine

## 2012-04-09 ENCOUNTER — Ambulatory Visit (INDEPENDENT_AMBULATORY_CARE_PROVIDER_SITE_OTHER): Payer: Medicare Other | Admitting: Family Medicine

## 2012-04-09 VITALS — BP 150/80 | Temp 98.2°F

## 2012-04-09 DIAGNOSIS — J209 Acute bronchitis, unspecified: Secondary | ICD-10-CM | POA: Diagnosis not present

## 2012-04-09 MED ORDER — HYDROCODONE-HOMATROPINE 5-1.5 MG/5ML PO SYRP: 5.0000 mL | ORAL_SOLUTION | Freq: Four times a day (QID) | ORAL | Status: AC | PRN

## 2012-04-09 NOTE — Patient Instructions (Addendum)
Acute Bronchitis You have acute bronchitis. This means you have a chest cold. The airways in your lungs are red and sore (inflamed). Acute means it is sudden onset.  CAUSES Bronchitis is most often caused by the same virus that causes a cold. SYMPTOMS   Body aches.  Chest congestion.  Chills.  Cough.  Fever.  Shortness of breath.  Sore throat. TREATMENT  Acute bronchitis is usually treated with rest, fluids, and medicines for relief of fever or cough. Most symptoms should go away after a few days or a week. Increased fluids may help thin your secretions and will prevent dehydration. Your caregiver may give you an inhaler to improve your symptoms. The inhaler reduces shortness of breath and helps control cough. You can take over-the-counter pain relievers or cough medicine to decrease coughing, pain, or fever. A cool-air vaporizer may help thin bronchial secretions and make it easier to clear your chest. Antibiotics are usually not needed but can be prescribed if you smoke, are seriously ill, have chronic lung problems, are elderly, or you are at higher risk for developing complications.Allergies and asthma can make bronchitis worse. Repeated episodes of bronchitis may cause longstanding lung problems. Avoid smoking and secondhand smoke.Exposure to cigarette smoke or irritating chemicals will make bronchitis worse. If you are a cigarette smoker, consider using nicotine gum or skin patches to help control withdrawal symptoms. Quitting smoking will help your lungs heal faster. Recovery from bronchitis is often slow, but you should start feeling better after 2 to 3 days. Cough from bronchitis frequently lasts for 3 to 4 weeks. To prevent another bout of acute bronchitis:  Quit smoking.  Wash your hands frequently to get rid of viruses or use a hand sanitizer.  Avoid other people with cold or virus symptoms.  Try not to touch your hands to your mouth, nose, or eyes. SEEK IMMEDIATE  MEDICAL CARE IF:  You develop increased fever, chills, or chest pain.  You have severe shortness of breath or bloody sputum.  You develop dehydration, fainting, repeated vomiting, or a severe headache.  You have no improvement after 1 week of treatment or you get worse. MAKE SURE YOU:   Understand these instructions.  Will watch your condition.  Will get help right away if you are not doing well or get worse. Document Released: 05/01/2004 Document Revised: 06/16/2011 Document Reviewed: 07/17/2010 [facility] Patient Information 2013 [place].  Consider over the counter plain mucinex.

## 2012-04-09 NOTE — Progress Notes (Signed)
  Subjective:    Patient ID: [name] [name], female    DOB: 10-28-1932, 79 y.o.   MRN: [id number]  HPI 4 day history of cough which has become more productive. She's had some rhinorrhea and nasal congestion. No dyspnea. No fever. Intermittent laryngitis symptoms. Cough productive of yellow sputum especially early mornings. Has taken no over-the-counter medications. Patient quit smoking 1984. No wheezing.   Review of Systems  Constitutional: Negative for fever and chills.  HENT: Positive for congestion.   Respiratory: Positive for cough. Negative for shortness of breath and wheezing.   Cardiovascular: Negative for chest pain.       Objective:   Physical Exam  Constitutional: She appears well-developed and well-nourished.  HENT:  Right Ear: External ear normal.  Left Ear: External ear normal.  Mouth/Throat: Oropharynx is clear and moist.  Neck: Neck supple. No thyromegaly present.  Cardiovascular: Normal rate and regular rhythm.   Pulmonary/Chest:       ? Slightly diminished breath sounds left anterior upper lobe otherwise clear. No rales. No wheezes. No retractions.          Assessment & Plan:  Acute bronchitis. Suspect viral. Mucinex one tablet twice daily. Hycodan 1 teaspoon every 6 hours for severe cough. Followup promptly for fever or worsening symptoms

## 2012-04-09 NOTE — Telephone Encounter (Signed)
Patient Information:  Caller Name: Elliana  Phone: [phone]  Patient: [name] [name], [name] [name]  Gender: Female  DOB: November 08, 1932  Age: 79 Years  PCP: Carolann Littler (Family Practice)  Office Follow Up:  Does the office need to follow up with this patient?: No  Instructions For The Office: N/A  RN Note:  Referenced Upper Respiratory Infection guideline, disposition: see provider within 24 hours for "Productive cough with colored sputum."  Symptoms  Reason For Call & Symptoms: Reports hoarseness, productive cough with yellow sputum, nasal congestion.  Reviewed Health History In EMR: Yes  Reviewed Medications In EMR: Yes  Reviewed Allergies In EMR: Yes  Reviewed Surgeries / Procedures: Yes  Date of Onset of Symptoms: 04/06/2012  Treatments Tried: Tylenol  Treatments Tried Worked: Yes  Guideline(s) Used:  No Protocol Available - Sick Adult  Disposition Per Guideline:   See Today in Office  Reason For Disposition Reached:   Nursing judgment  Advice Given:  Call Back If:  New symptoms develop  You become worse.  Appointment Scheduled:  04/09/2012 16:30:00 Appointment Scheduled Provider:  Carolann Littler [facility])

## 2012-04-12 ENCOUNTER — Other Ambulatory Visit: Payer: Self-pay | Admitting: Family Medicine

## 2012-04-13 ENCOUNTER — Telehealth: Payer: Self-pay | Admitting: Family Medicine

## 2012-04-13 NOTE — Telephone Encounter (Signed)
Patient Information:  Caller Name: Marisela  Phone: [phone]  Patient: [name] [name], [name] [name]  Gender: Female  DOB: 06-24-1932  Age: 79 Years  PCP: Carolann Littler (Family Practice)  Office Follow Up:  Does the office need to follow up with this patient?: No  Instructions For The Office: N/A  RN Note:  Fasting blood sugar (FBS) not tested. Last tested 04/12/12; 124; Intermittent wheezing during coughing spells. Feels short of breath after coughing fits.  Declined appointment with another MD 04/13/12; prefers to wait until 04/14/12 to see Dr. Elease Hashimoto.  Symptoms  Reason For Call & Symptoms: Called for antibiotic for ongoing cold & cough symptoms.  Productive green cough with rhinorrhea or stuffy nose.  Had bloody nose 04/12/12.  Reviewed Health History In EMR: Yes  Reviewed Medications In EMR: Yes  Reviewed Allergies In EMR: Yes  Reviewed Surgeries / Procedures: Yes  Date of Onset of Symptoms: 04/05/2012  Treatments Tried: Mucinex, RX cough medication, steam treatments, drinking warm fluids  Treatments Tried Worked: Yes  Guideline(s) Used:  Cough  Disposition Per Guideline:   See Today in Office  Reason For Disposition Reached:   Severe coughing spells (e.g., whooping sound after coughing, vomiting after coughing)  Advice Given:  Reassurance  Coughing is the way that our lungs remove irritants and mucus. It helps protect our lungs from getting pneumonia.  You can get a dry hacking cough after a chest cold. Sometimes this type of cough can last 1-3 weeks, and be worse at night.  Prevent Dehydration:  Drink adequate liquids.  This will help soothe an irritated or dry throat and loosen up the phlegm.  Expected Course:   Viral bronchitis (chest cold) causes a cough that lasts 1 to 3 weeks. Sometimes you may cough up lots of phlegm (sputum, mucus). The mucus can normally be white, gray, yellow, or green.  Call Back If:  Difficulty breathing  You become worse.  Appointment Scheduled:  04/14/2012 08:30:00 Appointment Scheduled Provider:  Carolann Littler [facility])

## 2012-04-13 NOTE — Telephone Encounter (Signed)
noted 

## 2012-04-14 ENCOUNTER — Ambulatory Visit (INDEPENDENT_AMBULATORY_CARE_PROVIDER_SITE_OTHER): Payer: Medicare Other | Admitting: Family Medicine

## 2012-04-14 ENCOUNTER — Encounter: Payer: Self-pay | Admitting: Family Medicine

## 2012-04-14 VITALS — BP 130/70 | HR 72 | Temp 97.9°F

## 2012-04-14 DIAGNOSIS — R05 Cough: Secondary | ICD-10-CM | POA: Diagnosis not present

## 2012-04-14 MED ORDER — CEFUROXIME AXETIL 250 MG PO TABS: 250.0000 mg | ORAL_TABLET | Freq: Two times a day (BID) | ORAL | Status: DC

## 2012-04-14 NOTE — Patient Instructions (Addendum)
Follow up promptly for any fever or increased shortness of breath. 

## 2012-04-14 NOTE — Progress Notes (Signed)
  Subjective:    Patient ID: [name] [name], female    DOB: 01/27/1933, 79 y.o.   MRN: [id number]  HPI Persistent cough which is becoming more productive. She still has no fever. Onset of symptoms about one week ago. She's had some nasal congestion. No dyspnea. No wheezing. No peripheral edema. She's tried over-the-counter cough medication without much improvement.   Review of Systems  Constitutional: Negative for fever and chills.  HENT: Positive for congestion.   Respiratory: Positive for cough. Negative for shortness of breath and wheezing.   Cardiovascular: Negative for chest pain.       Objective:   Physical Exam  Constitutional: She appears well-developed and well-nourished.  Neck: Neck supple.  Cardiovascular: Normal rate and regular rhythm.   Pulmonary/Chest: Effort normal and breath sounds normal. No respiratory distress. She has no wheezes. She has no rales.       No wheezes. Symmetric breath sounds  Musculoskeletal: She exhibits no edema.  Lymphadenopathy:    She has no cervical adenopathy.          Assessment & Plan:  Cough. Becoming more productive. Given duration of symptoms and multiple chronic medical problems start Ceftin 250 mg twice daily for 10 days. Followup promptly for fever or worsening symptoms. Routine medical followup scheduled for 3 months

## 2012-04-15 ENCOUNTER — Telehealth: Payer: Self-pay

## 2012-04-15 MED ORDER — APIXABAN 5 MG PO TABS: 5.0000 mg | ORAL_TABLET | Freq: Two times a day (BID) | ORAL | Status: DC

## 2012-04-15 NOTE — Telephone Encounter (Signed)
Spoke with patient she stated she changed her laundry detergent and fabric softner.States she bought the kind for sensitive skin and her itching has improved.Pateint was told spoke to [place] and he advised may change to xarelto 20 mg daily.Patient stated she will continue eliquis for now and will call back if needed.

## 2012-04-20 ENCOUNTER — Encounter: Payer: Self-pay | Admitting: Cardiology

## 2012-04-20 ENCOUNTER — Ambulatory Visit (INDEPENDENT_AMBULATORY_CARE_PROVIDER_SITE_OTHER): Payer: Medicare Other | Admitting: Cardiology

## 2012-04-20 VITALS — BP 140/80 | HR 72 | Ht 67.0 in | Wt 201.8 lb

## 2012-04-20 DIAGNOSIS — I10 Essential (primary) hypertension: Secondary | ICD-10-CM

## 2012-04-20 DIAGNOSIS — I4891 Unspecified atrial fibrillation: Secondary | ICD-10-CM

## 2012-04-20 DIAGNOSIS — R609 Edema, unspecified: Secondary | ICD-10-CM

## 2012-04-20 MED ORDER — ROSUVASTATIN CALCIUM 5 MG PO TABS: 5.0000 mg | ORAL_TABLET | Freq: Every day | ORAL | Status: DC

## 2012-04-20 NOTE — Progress Notes (Signed)
[name] [name] Date of Birth: [birth year] Medical Record #[id number]  History of Present Illness: [name] [name] is seen for follow up of her atrial fibrillation. She continues to do well and denies any chest pain, SOB, palpitations, or dizzyness. She thought she developed itching on Eliquis but this resolved with a single dose of Atarax. She has had a recent URI and is on antibiotics.  Current Outpatient Prescriptions on File Prior to Visit  Medication Sig Dispense Refill  . apixaban (ELIQUIS) 5 MG TABS tablet Take 1 tablet (5 mg total) by mouth 2 (two) times daily.  60 tablet  6  . AZOR 5-40 MG per tablet TAKE ONE TABLET BY MOUTH EVERY DAY  90 tablet  3  . BYSTOLIC 10 MG tablet TAKE ONE TABLET BY MOUTH EVERY DAY  90 tablet  3  . CALCIUM CITRATE PO Take 1,200 mg by mouth daily.        . cefUROXime (CEFTIN) 250 MG tablet Take 1 tablet (250 mg total) by mouth 2 (two) times daily.  20 tablet  0  . cholecalciferol (VITAMIN D) 1000 UNITS tablet Take 3,000 Units by mouth daily.        . diazepam (VALIUM) 5 MG tablet TAKE ONE TABLET BY MOUTH AS NEEDED  30 tablet  0  . FeFum-FePo-FA-B Cmp-C-Zn-Mn-Cu (SE-TAN PLUS) 162-115.2-1 MG CAPS TAKE ONE CAPSULE BY MOUTH EVERY DAY  30 capsule  11  . fluticasone (FLONASE) 50 MCG/ACT nasal spray Place 2 sprays into the nose daily as needed. For stuffy nose.  16 g  11  . Fluticasone-Salmeterol (ADVAIR DISKUS) 250-50 MCG/DOSE AEPB Inhale 1 puff into the lungs 2 (two) times daily.  60 each  5  . furosemide (LASIX) 20 MG tablet Take 1 tablet (20 mg total) by mouth daily.  30 tablet  0  . glipiZIDE (GLUCOTROL) 10 MG tablet TAKE ONE TABLET BY MOUTH TWICE DAILY  180 tablet  0  . hydrOXYzine (ATARAX/VISTARIL) 50 MG tablet Take 1 tablet (50 mg total) by mouth every 8 (eight) hours as needed for itching.  30 tablet  1  . levothyroxine (SYNTHROID, LEVOTHROID) 125 MCG tablet TAKE ONE TABLET BY MOUTH EVERY DAY  90 tablet  3  . loratadine (CLARITIN) 10 MG tablet Take 10 mg by mouth  daily.      . metFORMIN (GLUCOPHAGE-XR) 500 MG 24 hr tablet TAKE TWO TABLETS BY MOUTH TWICE DAILY  120 tablet  11  . Multiple Vitamins-Minerals (CENTRUM ULTRA WOMENS) TABS Take by mouth daily.        . multivitamin (METANX) 3-35-2 MG TABS tablet TAKE ONE TABLET BY MOUTH EVERY DAY  90 tablet  3  . ondansetron (ZOFRAN) 4 MG tablet Take 1 tablet (4 mg total) by mouth every 8 (eight) hours as needed for nausea.  30 tablet  0  . promethazine (PHENERGAN) 25 MG tablet Take 1 tablet (25 mg total) by mouth every 8 (eight) hours as needed for nausea.  20 tablet  0  . rosuvastatin (CRESTOR) 5 MG tablet Take 1 tablet (5 mg total) by mouth daily.  30 tablet  11  . triamcinolone cream (KENALOG) 0.1 % Apply topically 2 (two) times daily. Compound 1:1 with Eucerin cream.  454 g  0    Allergies  Allergen Reactions  . Amoxicillin     GI upset, pt says lower doses are ok.patient can take low dose  . Codeine Sulfate     REACTION: GI upset  . Sulfonamide Derivatives  REACTION: GI upset  . Atarax (Hydroxyzine) Nausea Only and Rash    Past Medical History  Diagnosis Date  . HYPOTHYROIDISM 07/24/2008  . HYPERLIPIDEMIA 07/24/2008  . DEPRESSION 05/16/2009  . HYPERTENSION 07/24/2008  . Vertigo   . Diabetes mellitus type II   . Arthritis     r tkr  . CVA (cerebral vascular accident) 2008    mini stroke.no residual  . Heart murmur     stress test 2009.dr peter Martinique  . Intermittent vertigo   . Skin abnormality     facial lesions .pt applying mupiracin to areas  . Atrial fibrillation   . Breast cancer     Past Surgical History  Procedure Date  . Knee surgery 2009    TKR  . Cholecystectomy 1980  . Joint replacement     r knee  . Breast surgery 2007    Lumpectomy, XRT 2006.l breast  . Video assisted thoracoscopy 04/22/2011    Procedure: VIDEO ASSISTED THORACOSCOPY;  Surgeon: Melrose Nakayama, MD;  Location: [facility];  Service: Thoracic;  Laterality: Left;  WITH BIOPSY    History  Smoking  status  . Former Smoker -- 0.5 packs/day for 12 years  . Types: Cigarettes  . Quit date: 06/04/1982  Smokeless tobacco  . Not on file    History  Alcohol Use No    Family History  Problem Relation Age of Onset  . Arthritis Mother   . Rheum arthritis Mother   . Heart disease Father   . Coronary artery disease Father   . Cancer Sister     breast CA, both sisters  . Breast cancer Sister   . Coronary artery disease Brother   . Lung cancer Brother     Review of Systems: The review of systems is as noted in HPI.  All other systems were reviewed and are negative.  Physical Exam: BP 140/80  Pulse 72  Ht _0  (1.702 m)  Wt 201 lb 12.8 oz (91.536 kg)  BMI 31.61 kg/m2  SpO2 97% She is a pleasant, elderly, obese white female in no acute distress. She is seen in a wheelchair. She is normocephalic, atraumatic. Pupils are equal round and reactive to light accommodation. Extraocular movements are full. Sclera are clear. Oropharynx is clear. Neck is supple without JVD, adenopathy, thyromegaly, or bruits. Lungs are clear. Cardiac exam reveals an irregular rate and rhythm without gallop, murmur, or click. Abdomen is obese, soft, and nontender. No masses or bruits. Extremities reveal 1+ pretibial edema. Pedal pulses are good. She is alert oriented x3. Cranial nerves II through XII are intact. Mood is appropriate. LABORATORY DATA: Lab Results  Component Value Date   WBC 5.7 03/26/2012   HGB 10.4* 03/26/2012   HCT 32.4* 03/26/2012   PLT 243 03/26/2012   GLUCOSE 142* 03/26/2012   CHOL 158 10/21/2011   TRIG 235.0* 10/21/2011   HDL 50.50 10/21/2011   LDLDIRECT 75.3 10/21/2011   LDLCALC 96 04/04/2010   ALT 16 03/26/2012   AST 23 03/26/2012   NA 132* 03/26/2012   K 4.1 03/26/2012   CL 99 03/26/2012   CREATININE 1.2* 03/26/2012   BUN 19.0 03/26/2012   CO2 27 03/26/2012   TSH 3.46 02/16/2012   INR 1.06 04/18/2011   HGBA1C 7.2* 02/20/2012   MICROALBUR 1.7 08/15/2009    Transthoracic  Echocardiography  Patient: [name], [name] MR #: [id number] Study Date: 02/23/2012 Gender: F Age: 16 Height: 170.2cm Weight: 96.6kg BSA: 2.12m2 Pt. Status: Room:  ORDERING  Martinique, Peter REFERRING Martinique, Peter ATTENDING Dola Argyle REFERRING Burchette, Alinda Sierras PERFORMING Zacarias Pontes, [facility] SONOGRAPHER Barbra Sarks, RDCS cc:  ------------------------------------------------------------ LV EF: 55%  ------------------------------------------------------------ Indications: Atrial fibrillation - 427.31.  ------------------------------------------------------------ History: PMH: Breast cancer. Acquired from the patient and from the patient's chart. Dyspnea. Atrial fibrillation. Stroke. Transient ischemic attack. Risk factors: Hypertension. Diabetes mellitus. Dyslipidemia.  ------------------------------------------------------------ Study Conclusions  - Left ventricle: Mild septal flattening. The cavity size was normal. Wall thickness was increased increased in a pattern of mild to moderate LVH. The estimated ejection fraction was 55%. The study is not technically sufficient to allow evaluation of LV diastolic function. - Aortic valve: Trivial regurgitation. - Mitral valve: Mild regurgitation. - Left atrium: The atrium was moderately to severely dilated. - Right atrium: Prominent Chiari network seen in RA. THE QUALITY OF THE 2008 STUDY IS NOT AS GOOD. IN ONE VIEW I BELIEVE THE CHIARI NETWORK CAN BE SEEN. HOWEVER, I CAN NOT BE SURE. I DOUBT THE CURRENT FINDING IS CLOT OR TUMOR. The atrium was moderately dilated. - Tricuspid valve: Moderate regurgitation. - Pulmonary arteries: PA peak pressure: 60m Hg (S). - Pericardium, extracardiac: A small pericardial effusion was identified posterior to the heart. Transthoracic echocardiography. M-mode, complete 2D, spectral Doppler, and color Doppler. Height: Height: 170.2cm. Height: 67in. Weight: Weight: 96.6kg.  Weight: 212.6lb. Body mass index: BMI: 33.4kg/m^2. Body surface area: BSA: 2.052m. Blood pressure: 145/90. Patient status: Outpatient. Location: [facility]  ------------------------------------------------------------  ------------------------------------------------------------ Left ventricle: Mild septal flattening. The cavity size was normal. Wall thickness was increased increased in a pattern of mild to moderate LVH. The estimated ejection fraction was 55%. The study is not technically sufficient to allow evaluation of LV diastolic function.  ------------------------------------------------------------ Aortic valve: Sclerosis without stenosis. Doppler: Trivial regurgitation.  ------------------------------------------------------------ Aorta: Aortic root: The aortic root was normal in size.  ------------------------------------------------------------ Mitral valve: Mildly thickened leaflets . Doppler: Mild regurgitation. Peak gradient: 29m2mg (D).  ------------------------------------------------------------ Left atrium: The atrium was moderately to severely dilated.  ------------------------------------------------------------ Right ventricle: The cavity size was at the upper limits of normal. Systolic function was normal.  ------------------------------------------------------------ Pulmonic valve: The valve appears to be grossly normal. Doppler: No significant regurgitation.  ------------------------------------------------------------ Tricuspid valve: The valve appears to be grossly normal. Doppler: Moderate regurgitation.  ------------------------------------------------------------ Right atrium: Prominent Chiari network seen in RA. THE QUALITY OF THE 2008 STUDY IS NOT AS GOOD. IN ONE VIEW I BELIEVE THE CHIARI NETWORK CAN BE SEEN. HOWEVER, I CAN NOT BE SURE. I DOUBT THE CURRENT FINDING IS CLOT OR TUMOR. The atrium was moderately  dilated.  ------------------------------------------------------------ Pericardium: A small pericardial effusion was identified posterior to the heart.  ------------------------------------------------------------ Systemic veins: Inferior vena cava: The vessel was normal in size; the respirophasic diameter changes were in the normal range (= 50%); findings are consistent with normal central venous pressure.  ------------------------------------------------------------  2D measurements Normal Doppler measurements Normal Left ventricle Main pulmonary LVID ED, 44.7 mm 43-52 artery chord, Pressure, S 53 mm Hg =30 PLAX LVOT LVID ES, 29.3 mm 23-38 Peak vel, S 77. cm/s ------ chord, 1 PLAX VTI, S 17. cm ------ FS, chord, 34 % >29 2 PLAX Stroke vol 54 ml ------ LVPW, ED 14.5 mm ------ Stroke 26 ml/m^2 ------ IVS/LVPW 0.95 <1.3 index ratio, ED Mitral valve Ventricular septum Peak E vel 121 cm/s ------ IVS, ED 13.8 mm ------ Deceleratio 165 ms 150-23 LVOT n time 0 Diam, S 20 mm ------ Peak 6 mm Hg ------ Area 3.14 cm^2 ------ gradient,  D Diam 20 mm ------ Tricuspid valve Aorta Regurg peak 345 cm/s ------ Root diam, 32 mm ------ vel ED Peak RV-RA 48 mm Hg ------ Left atrium gradient, S AP dim 51 mm ------ Systemic veins AP dim 2.45 cm/m^2 <2.2 Estimated 5 mm Hg ------ index CVP Right ventricle Pressure, S 53 mm Hg <30  ------------------------------------------------------------ Prepared and Electronically Authenticated by  Dola Argyle 2013-11-19T06:[id number]  Assessment / Plan: 1. Atrial fibrillation. Persistent. Duration is unknown but probably chronic. Patient is asymptomatic. Rate is well controlled on bystolic. TFTs are normal. We will continue with her current rate control. Continue Eliquis 5 mg twice a day. I will followup again in 6 months.  2. Hypertension.  3. History of TIA.  4. Hypercholesterolemia.  5. Diabetes mellitus type 2.

## 2012-04-20 NOTE — Patient Instructions (Signed)
Continue your current therapy  I will see you in 6 months.

## 2012-05-03 ENCOUNTER — Telehealth: Payer: Self-pay | Admitting: *Deleted

## 2012-05-03 NOTE — Telephone Encounter (Signed)
Pt called about her appt.  Confirmed 09/14/12 appt w/ pt.  Mailed letter & calendar to pt.

## 2012-05-05 ENCOUNTER — Encounter: Payer: Self-pay | Admitting: Oncology

## 2012-05-10 ENCOUNTER — Other Ambulatory Visit: Payer: Self-pay | Admitting: Oncology

## 2012-05-10 DIAGNOSIS — Z853 Personal history of malignant neoplasm of breast: Secondary | ICD-10-CM

## 2012-05-11 ENCOUNTER — Ambulatory Visit (INDEPENDENT_AMBULATORY_CARE_PROVIDER_SITE_OTHER): Payer: Medicare Other | Admitting: Internal Medicine

## 2012-05-11 ENCOUNTER — Encounter: Payer: Self-pay | Admitting: Internal Medicine

## 2012-05-11 ENCOUNTER — Telehealth: Payer: Self-pay | Admitting: Internal Medicine

## 2012-05-11 ENCOUNTER — Other Ambulatory Visit: Payer: Medicare Other

## 2012-05-11 DIAGNOSIS — L299 Pruritus, unspecified: Secondary | ICD-10-CM | POA: Diagnosis not present

## 2012-05-11 DIAGNOSIS — J84116 Cryptogenic organizing pneumonia: Secondary | ICD-10-CM

## 2012-05-11 NOTE — Telephone Encounter (Signed)
Katie did you call pt. I do not see anything. thanks

## 2012-05-11 NOTE — Patient Instructions (Addendum)
Ok to try splitting your Atarax antihistamine pills to take 1/2 eery 8 hours if needed for itching  Since the itching started at the time you started Eliquis, we will ask Dr Martinique to switch to a different medicine for a month to see if the itching goes away.  Order- lab- IgE level     Dx pruritus

## 2012-05-11 NOTE — Progress Notes (Signed)
04/03/11- 79 year old female former smoking history referred courtesy of Dr. Elease Hashimoto with concern of abnormal chest CT. Husband is an allergy patient here. Dr. Erick Blinks recent office note reviewed "Followup from recent pneumonia. Patient had presented here with extreme myalgias and chronic pain along with bilateral shoulder hip pain. Very high sedimentation rate of 120.  She almost total resolution of myalgias with low dose prednisone strongly suggesting polymyalgia rheumatica. She subsequently presented with cough with trace of blood but no dyspnea, fever, reported a pain. Chest x-ray showed bilateral infiltrates suggesting probable bilateral pneumonia the recommendation for CT scan. CT scan revealed no adenopathy and no evidence for a lung mass. Compatible with bilateral pneumonia. Patient placed on antibiotic and much better at this time with no fever and essentially no cough. Her muscle and joint pains are almost 100% resolved with low-dose prednisone. She did present back in September with severe sinus disease suggesting acute sinusitis. CT revealed no acute findings. She denies any arthralgias at this time. No skin rash. No fever." She has been on prednisone 10 mg twice daily since December 13 and feels significantly better. Morning cough has cleared with residual trace phlegm and trace blood. No chest pain. CT chest 03/23/2011 -images reviewed by me-bilateral patchy airspace disease with air bronchograms. No cavitation or definite nodularity seen. She has had right total knee replacement but no generalized arthritis. Childhood exposure to an uncle with tuberculosis. Her PPD skin test was negative. She denies history of lung disease, GERD, DVT, kidney or liver disease. Lab- C-ANCA POS 1:320, P-ANCA NEG. RA 1:23, ANA NEG11 year old female former smoking history referred courtesy of Dr. Elease Hashimoto with concern of abnormal chest CT. Husband is an allergy patient here.  05/13/11- 52 yoF former smoker  followed for bilateral pulmonary infiltrates, incr C-ANCA, s/p VATS bx/ organizing pneumonia, Metastatic intestinal carcinoid. Marland Kitchen Hx breast Ca 2006/ Dr Truddie Coco Husband here. She returns now after left lung biopsy. She has expected left thoracotomy pain but is otherwise stable as she continues maintenance prednisone. Denies blood, fever, swollen glands, discolored sputum. Exertional dyspnea is not worse, allowing for the surgery. I shared with her and her husband the available initial pathology: Patchy organizing pneumonia with chronic interstitial pneumonia and focal atypical glands. I reviewed the discussion about her at thoracic oncology conference. The parenchymal lung process is not a vasculitis as I originally questioned. I told her there was an incidental finding of cancer cells with further identification pending but that this was a separate process. We agreed to refer her to Dr. Truddie Coco who managed her breast cancer. After she had left, personal communication from Dr. Jimmy Footman- neuroendocrine tumor staining consistent with metastatic intestinal carcinoid.  06/20/11  11 yoF former smoker followed for bilateral pulmonary infiltrates, incr C-ANCA, s/p VATS bx/ organizing pneumonia, Metastatic intestinal carcinoid. Marland Kitchen Hx breast Ca 2006/ Dr Truddie Coco   Husband here She says her breathing is now "fine" back to her normal. She denies fever, cough, sputum. I spoke with Mrs. Roehrs myself and had to explain the discovery of carcinoid after her lung biopsy. We also have the note from the [facility] indicating they discussed it with her. She has absolutely no recollection. Little cough or shortness of breath now. Still a little posterior thoracotomy incision pain. CT 05/19/11- images reviewed with them- IMPRESSION:  1. Marked improvement in bilateral air space disease seen on  comparison CT of 03/24/2011. Near complete resolution.  2. Postoperative change in the left hemithorax consistent with  recent VATS.   Original Report  Authenticated By: Suzy Bouchard, M.D.   11/07/11- 76 yoF former smoker followed for bilateral pulmonary infiltrates, incr C-ANCA, s/p VATS bx/ organizing pneumonia, Metastatic intestinal carcinoid. Marland Kitchen Hx breast Ca 2006/ Dr Truddie Coco    c/o  Chronic vertigo, difficulty breathing and gets tired easy. Also some wheezing, post nasal,  and cough.  Some days she feels much better than others, no real pattern. Occasional light cough to clear throat. We had called in prednisone, which did help. Ambulatory around home, but gets out little. CT chest 09/09/11- I reviewed w/ her- there is significant patchy bilateral lower zone fibrotic scarring and some bronchial thickening. IMPRESSION:  1. No evidence of thoracic metastatic disease or other active  process.  2. Small mild cardiomegaly and hiatal hernia.  Original Report Authenticated By: Marlaine Hind, M.D.    02/09/12-  57 yoF former smoker followed for bilateral pulmonary infiltrates, incr C-ANCA, s/p VATS bx/ cryptogenic organizing pneumonia/ BOOP, Metastatic intestinal carcinoid. Marland Kitchen Hx breast Ca 2006/ Dr Truddie Coco  January had biopsy to ck for other lung condition. SOB and wheezing at times. Left sided pain around the  Back. Continues Advair 250. Due for followup chest CT in December for Dr. Jacquiline Doe. Not on prednisone. She says breathing is unchanged. Denies pain, blood or swollen nodes.   05/11/12-  53 yoF former smoker followed for bilateral pulmonary infiltrates, incr C-ANCA, s/p VATS bx/ cryptogenic organizing pneumonia/ BOOP, Metastatic intestinal carcinoid. Complicated by Hx breast Ca 2006/ Dr Truddie Coco. AFib/ Dr Martinique, DM. She says her husband is developing dementia. Deep breath makes her left flank feel tight where she had thoracentesis. Complains of itching since starting Eliquis.Atarax 50 mg was too strong and Benadryl did not help. Itching keeps her awake all night. No visible rash. She is convinced really unpleasant itching began when  she started Eliquis. I explained this medication needs to be transitioned carefully to anything else and Dr. Martinique would need to be the one to manage that. CT chest 03/26/12 . IMPRESSION:  1. No findings to strongly suggest metastatic disease to the  thorax.  2. There is a new focus of nodular ground-glass attenuation  measuring 1 cm in the anterior aspect of the left upper lobe which  is highly nonspecific. Initial follow-up by chest CT without  contrast is recommended in 3 months to confirm persistence. This  recommendation follows the consensus statement: Recommendations for  the Management of Subsolid Pulmonary Nodules Detected at CT: A  Statement from the Fleischner Society as published in Radiology  2013; 266:304-317.  3. Atherosclerosis, including left main and three-vessel coronary  artery disease. Assessment for potential risk factor  modification, dietary therapy or pharmacologic therapy may be  warranted, if clinically indicated.  4. Slight increase in the small amount of pericardial fluid  compared to the prior examination. This is unlikely to be of  hemodynamic significance at this time.  5. Moderate cardiomegaly.  6. Additional incidental findings, similar to prior studies, as  discussed above.  Original Report Authenticated By: Vinnie Langton, M.D.   ROS-see HPI Constitutional:   No-   weight loss, night sweats, fevers, chills, fatigue, lassitude. HEENT:   No-  headaches, difficulty swallowing, tooth/dental problems, sore throat,       No-  sneezing, itching, ear ache, nasal congestion, post nasal drip,  CV: +chest pain, orthopnea, PND, swelling in lower extremities, anasarca,   + chronic dizziness,   No-palpitations Resp: +   shortness of breath with exertion or at rest.  Minimal  productive cough,  No non-productive cough,               No-   change in color of mucus.  No- wheezing.   Skin: No-   rash or lesions.+ intense pruritus GI:  No-   heartburn,  indigestion, abdominal pain, nausea, vomiting,  GU: MS:  No-   joint pain or swelling.  Neuro-     +Vertigo- uses wheelchair Psych:  No- change in mood or affect. No depression or anxiety.  No memory loss.  OBJ General- Alert, Oriented, Affect-appropriate, Distress- none acute, overweight, wheelchair Skin- rash-none, lesions- none, excoriation- none Lymphadenopathy- none Head- atraumatic            Eyes- Gross vision intact, PERRLA, conjunctivae -pale            Ears- Hearing, canals-normal            Nose- turbinate edema, no-Septal dev, mucus, polyps, erosion, perforation             Throat- Mallampati II-III , mucosa clear , drainage- none, tonsils- atrophic Neck- flexible , trachea midline, no stridor , thyroid nl, carotid no bruit Chest - symmetrical excursion , unlabored           Heart/CV-+ irregularly irregular , 2-3/6 precordial systolic murmur , no gallop  , no rub, nl s1 s2                           - JVD- none , edema- none, stasis changes- none, varices- none           Lung- clear, wheeze- none, cough- none , dullness-none, rub- none           Chest wall- healed VATS thoracotomy incision L Abd-  Br/ Gen/ Rectal- Not done, not indicated Extrem- cyanosis- none, clubbing, none, atrophy- none, strength- nl. Wheelchair for distance/ cane at home. Limited mobility R shoulder -extension limited by pain. Neuro- grossly intact to observation

## 2012-05-11 NOTE — Telephone Encounter (Signed)
Pt called back about same. [name] [name]

## 2012-05-12 NOTE — Telephone Encounter (Signed)
I called patient to inform her that the lab CY wanted added to her original labwork was not placed fast enough prior to lab being drawn. Pt aware and states she doesn't mind coming back this week to have lab drawn. Lab order in computer.

## 2012-05-13 ENCOUNTER — Other Ambulatory Visit (INDEPENDENT_AMBULATORY_CARE_PROVIDER_SITE_OTHER): Payer: Medicare Other

## 2012-05-13 DIAGNOSIS — L299 Pruritus, unspecified: Secondary | ICD-10-CM

## 2012-05-13 LAB — CBC WITH DIFFERENTIAL/PLATELET
Basophils Absolute: 0 10*3/uL (ref 0.0–0.1)
Eosinophils Absolute: 0.3 10*3/uL (ref 0.0–0.7)
HCT: 32 % — ABNORMAL LOW (ref 36.0–46.0)
Hemoglobin: 10.8 g/dL — ABNORMAL LOW (ref 12.0–15.0)
Lymphs Abs: 2.6 10*3/uL (ref 0.7–4.0)
MCHC: 33.8 g/dL (ref 30.0–36.0)
MCV: 91.6 fl (ref 78.0–100.0)
Monocytes Absolute: 0.8 10*3/uL (ref 0.1–1.0)
Neutro Abs: 3.6 10*3/uL (ref 1.4–7.7)
RDW: 13.8 % (ref 11.5–14.6)

## 2012-05-14 NOTE — Progress Notes (Signed)
Quick Note:  Pt aware of results. ______

## 2012-05-14 NOTE — Progress Notes (Signed)
Quick Note:  Pt aware of results. ______ 

## 2012-05-20 DIAGNOSIS — L299 Pruritus, unspecified: Secondary | ICD-10-CM | POA: Insufficient documentation

## 2012-05-20 NOTE — Assessment & Plan Note (Addendum)
She describes generalized itching as having begun just about the same time she started Eliquis. It is really getting to her and interfering with sleep. Atarax 50 mg was too strong. She is going to try splitting the tablet. The best therapeutic trial would be if Dr. Martinique were able to transition her to a different anticoagulant, at least for a month or so for trial.  Plan IgE level

## 2012-05-20 NOTE — Assessment & Plan Note (Signed)
There is a new groundglass area which may be part of the same process. As a former smoker, early [facility] is also possible. Plan followup CT, scheduled in 3 months.

## 2012-06-17 ENCOUNTER — Ambulatory Visit
Admission: RE | Admit: 2012-06-17 | Discharge: 2012-06-17 | Disposition: A | Discharge status: Home / Self Care | Payer: Medicare Other | Source: Ambulatory Visit | Attending: Oncology | Admitting: Oncology

## 2012-06-17 DIAGNOSIS — Z853 Personal history of malignant neoplasm of breast: Secondary | ICD-10-CM | POA: Diagnosis not present

## 2012-07-08 ENCOUNTER — Other Ambulatory Visit: Payer: Self-pay | Admitting: Family Medicine

## 2012-07-09 ENCOUNTER — Other Ambulatory Visit: Payer: Self-pay | Admitting: Emergency Medicine

## 2012-07-13 ENCOUNTER — Other Ambulatory Visit: Payer: Self-pay | Admitting: *Deleted

## 2012-07-13 DIAGNOSIS — C7A029 Malignant carcinoid tumor of the large intestine, unspecified portion: Secondary | ICD-10-CM

## 2012-07-15 ENCOUNTER — Ambulatory Visit (INDEPENDENT_AMBULATORY_CARE_PROVIDER_SITE_OTHER): Payer: Medicare Other | Admitting: Family Medicine

## 2012-07-15 ENCOUNTER — Encounter: Payer: Self-pay | Admitting: Family Medicine

## 2012-07-15 VITALS — BP 120/70 | Temp 98.5°F | Wt 206.0 lb

## 2012-07-15 DIAGNOSIS — I10 Essential (primary) hypertension: Secondary | ICD-10-CM | POA: Diagnosis not present

## 2012-07-15 DIAGNOSIS — E119 Type 2 diabetes mellitus without complications: Secondary | ICD-10-CM | POA: Diagnosis not present

## 2012-07-15 DIAGNOSIS — I4891 Unspecified atrial fibrillation: Secondary | ICD-10-CM | POA: Diagnosis not present

## 2012-07-15 DIAGNOSIS — E039 Hypothyroidism, unspecified: Secondary | ICD-10-CM | POA: Diagnosis not present

## 2012-07-15 NOTE — Progress Notes (Signed)
Subjective:    Patient ID: [name] [name], female    DOB: Aug 05, 1932, 79 y.o.   MRN: [id number]  HPI Routine medical follow up Patient has history of type 2 diabetes, hypertension, atrial fibrillation, hyperlipidemia, depression, hypothyroidism. Medications reviewed. Compliant with all.  Diabetes has been fairly well controlled. Remains on glipizide and metformin. No recent hypoglycemia. Most recent A1c 7.2%. Fasting blood sugars mostly low 100s. No symptoms of hyperglycemia.  Hypertension treated with multidrug regimen. Blood pressure stable. No dizziness. She's had previous history of significant hyponatremia with thiazide diuretics.  Patient history of atrial fibrillation which is treated with anticoagulation with Eliquis.  No recent bleeding complications. Very sedentary  Past Medical History  Diagnosis Date  . HYPOTHYROIDISM 07/24/2008  . HYPERLIPIDEMIA 07/24/2008  . DEPRESSION 05/16/2009  . HYPERTENSION 07/24/2008  . Vertigo   . Diabetes mellitus type II   . Arthritis     r tkr  . CVA (cerebral vascular accident) 2008    mini stroke.no residual  . Heart murmur     stress test 2009.dr peter [place]  . Intermittent vertigo   . Skin abnormality     facial lesions .pt applying mupiracin to areas  . Atrial fibrillation   . Breast cancer    Past Surgical History  Procedure Laterality Date  . Knee surgery  2009    TKR  . Cholecystectomy  1980  . Joint replacement      r knee  . Breast surgery  2007    Lumpectomy, XRT 2006.l breast  . Video assisted thoracoscopy  04/22/2011    Procedure: VIDEO ASSISTED THORACOSCOPY;  Surgeon: Melrose Nakayama, MD;  Location: [facility];  Service: Thoracic;  Laterality: Left;  WITH BIOPSY    reports that she quit smoking about 30 years ago. Her smoking use included Cigarettes. She has a 6 pack-year smoking history. She does not have any smokeless tobacco history on file. She reports that she does not drink alcohol. Her drug history is not on  file. family history includes Arthritis in her mother; Breast cancer in her sister; Cancer in her sister; Coronary artery disease in her brother and father; Heart disease in her father; Lung cancer in her brother; and Rheum arthritis in her mother. Allergies  Allergen Reactions  . Amoxicillin     GI upset, pt says lower doses are ok.patient can take low dose  . Codeine Sulfate     REACTION: GI upset  . Sulfonamide Derivatives     REACTION: GI upset  . Atarax (Hydroxyzine) Nausea Only and Rash      Review of Systems  Constitutional: Negative for fatigue and unexpected weight change.  Eyes: Negative for visual disturbance.  Respiratory: Negative for cough, chest tightness, shortness of breath and wheezing.   Cardiovascular: Negative for chest pain, palpitations and leg swelling.  Genitourinary: Negative for dysuria.  Neurological: Negative for dizziness, seizures, syncope, weakness, light-headedness and headaches.       Objective:   Physical Exam  Constitutional: She appears well-developed and well-nourished. No distress.  Neck: Neck supple. No thyromegaly present.  Cardiovascular: Normal rate.   Irregular rhythm  Pulmonary/Chest: Effort normal and breath sounds normal. No respiratory distress. She has no wheezes. She has no rales.  Musculoskeletal: She exhibits no edema.  Lymphadenopathy:    She has no cervical adenopathy.  Skin:  Feet reveal no skin lesions. Good distal foot pulses. Good capillary refill. No calluses. Normal sensation with monofilament testing  Assessment & Plan:  #1 type 2 diabetes. History of fair control. Well controlled by home fasting blood sugars. Recheck A1c. Continue yearly eye exams #2 hypertension. Stable and at goal. Continue current medications #3 hyperlipidemia. Continue Crestor. Repeat lipids at follow up #4 hypothyroidism. TSH was checked in November of 2013 and normal #5 atrial fibrillation history. Remains on anticoagulant  with no recent complications

## 2012-07-16 NOTE — Progress Notes (Signed)
Quick Note:  Pt informed on home VM ______ 

## 2012-07-17 ENCOUNTER — Encounter (HOSPITAL_BASED_OUTPATIENT_CLINIC_OR_DEPARTMENT_OTHER): Payer: Self-pay | Admitting: *Deleted

## 2012-07-17 ENCOUNTER — Emergency Department (HOSPITAL_BASED_OUTPATIENT_CLINIC_OR_DEPARTMENT_OTHER)
Admission: EM | Admit: 2012-07-17 | Discharge: 2012-07-18 | Disposition: A | Discharge status: Home / Self Care | Payer: Medicare Other | Attending: Emergency Medicine | Admitting: Emergency Medicine

## 2012-07-17 DIAGNOSIS — R011 Cardiac murmur, unspecified: Secondary | ICD-10-CM | POA: Diagnosis not present

## 2012-07-17 DIAGNOSIS — Z853 Personal history of malignant neoplasm of breast: Secondary | ICD-10-CM | POA: Insufficient documentation

## 2012-07-17 DIAGNOSIS — I10 Essential (primary) hypertension: Secondary | ICD-10-CM | POA: Diagnosis not present

## 2012-07-17 DIAGNOSIS — E039 Hypothyroidism, unspecified: Secondary | ICD-10-CM | POA: Insufficient documentation

## 2012-07-17 DIAGNOSIS — R040 Epistaxis: Secondary | ICD-10-CM | POA: Insufficient documentation

## 2012-07-17 DIAGNOSIS — Z8679 Personal history of other diseases of the circulatory system: Secondary | ICD-10-CM | POA: Diagnosis not present

## 2012-07-17 DIAGNOSIS — Z8739 Personal history of other diseases of the musculoskeletal system and connective tissue: Secondary | ICD-10-CM | POA: Diagnosis not present

## 2012-07-17 DIAGNOSIS — Z8673 Personal history of transient ischemic attack (TIA), and cerebral infarction without residual deficits: Secondary | ICD-10-CM | POA: Diagnosis not present

## 2012-07-17 DIAGNOSIS — E785 Hyperlipidemia, unspecified: Secondary | ICD-10-CM | POA: Insufficient documentation

## 2012-07-17 DIAGNOSIS — E119 Type 2 diabetes mellitus without complications: Secondary | ICD-10-CM | POA: Diagnosis not present

## 2012-07-17 DIAGNOSIS — Z79899 Other long term (current) drug therapy: Secondary | ICD-10-CM | POA: Insufficient documentation

## 2012-07-17 DIAGNOSIS — Z87891 Personal history of nicotine dependence: Secondary | ICD-10-CM | POA: Insufficient documentation

## 2012-07-17 DIAGNOSIS — Z862 Personal history of diseases of the blood and blood-forming organs and certain disorders involving the immune mechanism: Secondary | ICD-10-CM | POA: Diagnosis not present

## 2012-07-17 DIAGNOSIS — F329 Major depressive disorder, single episode, unspecified: Secondary | ICD-10-CM | POA: Insufficient documentation

## 2012-07-17 DIAGNOSIS — F3289 Other specified depressive episodes: Secondary | ICD-10-CM | POA: Insufficient documentation

## 2012-07-17 NOTE — ED Notes (Signed)
Pt has active nosebleed at present onset 2130. On [place]. Last taken at at 2100. Has been on this 3-4 months. Hx anemia

## 2012-07-18 MED ORDER — OXYMETAZOLINE HCL 0.05 % NA SOLN
NASAL | Status: AC
  Administered 2012-07-18: 02:00:00
  Filled 2012-07-18: qty 15

## 2012-07-18 NOTE — ED Provider Notes (Addendum)
History     CSN: [id number]  Arrival date & time 07/17/12  2225   First MD Initiated Contact with Patient 07/18/12 0057      Chief Complaint  Patient presents with  . Epistaxis    (Consider location/radiation/quality/duration/timing/severity/associated sxs/prior treatment) HPI This is a 79 year old female who is on Eliquis for atrial fibrillation. She states she has been sneezing frequently the past several days which she attributes to seasonal allergies. She also felt fatigue and a mild headache yesterday. About 9 PM yesterday evening while she was watching TV she developed the sudden onset of epistaxis. She is not sure which nostril(s) blood came. The bleeding was moderate. It has abated since arriving in the ED. She felt several clots fall from her nasopharynx in her mouth which she spat into an emesis bag. She denies trauma to the nose. She denies pain in the nose.  Past Medical History  Diagnosis Date  . HYPOTHYROIDISM 07/24/2008  . HYPERLIPIDEMIA 07/24/2008  . DEPRESSION 05/16/2009  . HYPERTENSION 07/24/2008  . Vertigo   . Diabetes mellitus type II   . Arthritis     r tkr  . CVA (cerebral vascular accident) 2008    mini stroke.no residual  . Heart murmur     stress test 2009.dr peter [place]  . Intermittent vertigo   . Skin abnormality     facial lesions .pt applying mupiracin to areas  . Atrial fibrillation   . Breast cancer     Past Surgical History  Procedure Laterality Date  . Knee surgery  2009    TKR  . Cholecystectomy  1980  . Joint replacement      r knee  . Breast surgery  2007    Lumpectomy, XRT 2006.l breast  . Video assisted thoracoscopy  04/22/2011    Procedure: VIDEO ASSISTED THORACOSCOPY;  Surgeon: Melrose Nakayama, MD;  Location: [facility] OR;  Service: Thoracic;  Laterality: Left;  WITH BIOPSY    Family History  Problem Relation Age of Onset  . Arthritis Mother   . Rheum arthritis Mother   . Heart disease Father   . Coronary artery disease Father    . Cancer Sister     breast CA, both sisters  . Breast cancer Sister   . Coronary artery disease Brother   . Lung cancer Brother     History  Substance Use Topics  . Smoking status: Former Smoker -- 0.50 packs/day for 12 years    Types: Cigarettes    Quit date: 06/04/1982  . Smokeless tobacco: Not on file  . Alcohol Use: No    OB History   Grav Para Term Preterm Abortions TAB SAB Ect Mult Living                  Review of Systems  All other systems reviewed and are negative.    Allergies  Amoxicillin; Codeine sulfate; Sulfonamide derivatives; and Atarax  Home Medications   Current Outpatient Rx  Name  Route  Sig  Dispense  Refill  . apixaban (ELIQUIS) 5 MG TABS tablet   Oral   Take 1 tablet (5 mg total) by mouth 2 (two) times daily.   60 tablet   6   . AZOR 5-40 MG per tablet      TAKE ONE TABLET BY MOUTH EVERY DAY   90 tablet   3   . BYSTOLIC 10 MG tablet      TAKE ONE TABLET BY MOUTH EVERY DAY   90 tablet  3   . CALCIUM CITRATE PO   Oral   Take 1,200 mg by mouth daily.           . cholecalciferol (VITAMIN D) 1000 UNITS tablet   Oral   Take 3,000 Units by mouth daily.           . diazepam (VALIUM) 5 MG tablet      TAKE ONE TABLET BY MOUTH AS NEEDED   30 tablet   0   . FeFum-FePo-FA-B Cmp-C-Zn-Mn-Cu (SE-TAN PLUS) 162-115.2-1 MG CAPS      TAKE ONE CAPSULE BY MOUTH EVERY DAY   30 capsule   11   . furosemide (LASIX) 20 MG tablet   Oral   Take 1 tablet (20 mg total) by mouth daily.   30 tablet   0   . glipiZIDE (GLUCOTROL) 10 MG tablet      TAKE ONE TABLET BY MOUTH TWICE DAILY   180 tablet   3   . hydrOXYzine (ATARAX/VISTARIL) 50 MG tablet   Oral   Take 1 tablet (50 mg total) by mouth every 8 (eight) hours as needed for itching.   30 tablet   1   . levothyroxine (SYNTHROID, LEVOTHROID) 125 MCG tablet      TAKE ONE TABLET BY MOUTH EVERY DAY   90 tablet   3   . loratadine (CLARITIN) 10 MG tablet   Oral   Take 10 mg by  mouth daily.         . metFORMIN (GLUCOPHAGE-XR) 500 MG 24 hr tablet      TAKE TWO TABLETS BY MOUTH TWICE DAILY   120 tablet   11   . Multiple Vitamins-Minerals (CENTRUM ULTRA WOMENS) TABS   Oral   Take by mouth daily.           . multivitamin (METANX) 3-35-2 MG TABS tablet      TAKE ONE TABLET BY MOUTH EVERY DAY   90 tablet   3     Dispense as written.   . Pyridoxine HCl (VITAMIN B-6 PO)   Oral   Take by mouth daily.         . rosuvastatin (CRESTOR) 5 MG tablet   Oral   Take 1 tablet (5 mg total) by mouth daily.   30 tablet   11   . triamcinolone cream (KENALOG) 0.1 %   Topical   Apply topically 2 (two) times daily. Compound 1:1 with Eucerin cream.   454 g   0     BP 148/119  Pulse 92  Temp(Src) 98.9 F (37.2 C) (Oral)  Resp 20  Ht 5' 7.5" (1.715 m)  Wt 202 lb (91.627 kg)  BMI 31.15 kg/m2  SpO2 98%  Physical Exam General: Well-developed, well-nourished female in no acute distress; appearance consistent with age of record HENT: normocephalic, atraumatic; no anterior nasal bleeding or clots seen; no blood or clots seen in posterior oropharynx Eyes: pupils equal round and reactive to light; extraocular muscles intact Neck: supple Heart: irregular rhythm Lungs: clear to auscultation bilaterally Abdomen: soft; nondistended; nontender; bowel sounds present Extremities: No deformity; full range of motion; trace edema of lower legs Neurologic: Awake, alert and oriented; motor function intact in all extremities and symmetric; no facial droop Skin: Warm and dry Psychiatric: Normal mood and affect    ED Course  Procedures (including critical care time)    MDM  2:13 AM Patient continues to be hemostatic. She was advised on how to use Afrin should bleeding  recur. She was advised not to blow her nose hoarseness. She should drip aspirin and her nose passively. If she's not able to control bleeding she should return.         Wynetta Fines,  MD 07/18/12 [place], MD 07/18/12 2703

## 2012-07-18 NOTE — ED Notes (Signed)
No bleeding at this time.

## 2012-07-26 ENCOUNTER — Telehealth: Payer: Self-pay | Admitting: Family Medicine

## 2012-07-26 ENCOUNTER — Other Ambulatory Visit: Payer: Self-pay | Admitting: Family Medicine

## 2012-07-26 NOTE — Telephone Encounter (Signed)
[facility] Triage Call Report Triage Record Num: [id number] Operator: Buford Dresser Patient Name: [name] [name] Call Date & Time: 07/23/2012 4:21:33PM Patient Phone: [phone] PCP: Carolann Littler Patient Gender: Female PCP Fax : [phone] Patient DOB: [birth year] Practice Name: [facility] Reason for Call: Caller: Dymin/Patient; PCP: Carolann Littler (Family Practice); CB#: [phone]; Calling about nose bleed x 1--used nasal spray and was able to stop bleeding quickly, sli headache, BP 160/85--takes Eliquis. Onset 07/23/12. BP usually 130's/70's. Had nose bleed and couldn't stop bleeding on 07/17/12--called EMS and they took her to ED where they gave her Major nasal decongestant spray. Guideline: Nose Bleed. Disposition: Provide Home/Self Care. Reason for Disposition: No symptoms presently but has concerns or requests for further information. Home care advice given. Advised to call back if nose bleeds again. Protocol(s) Used: Nosebleed Recommended Outcome per Protocol: Provide Home/Self Care Reason for Outcome: No symptoms presently but has concerns or request for further information Care Advice: ~ Call provider if symptoms continue, worsen, or new symptoms develop. ~ Keep the head higher than the level of the heart. Sit up or lie back a little with the head elevated. ~ SYMPTOM / CONDITION MANAGEMENT ~ CAUTIONS Post Nosebleed Care: * Avoid blowing nose for 12 hours after bleeding episode. * Do not pick nose. * Do not strain or bend down to lift anything heavy for 3 days. * Use a cool mist humidifier to moisten room air. * Do not use aspirin or nonsteroidal anti-inflammatory drugs for at least one week. * Elevate head on 3 pillows when lying down for the next 3 days. * Consider use of nonprescription nasal mucus membrane ointment or spray per label or pharmacist's instructions. ~ Nosebleed Care: - Keep person calm; agitation may increase bleeding. - Sit in an upright  position and tilt head forward. - Pinch all the soft parts of the nose just below the bony portion of the nose with the thumb and side of bent index finger. - Maintain firm pressure for a full 10 minutes by a clock. - Breathe through the mouth. - If bleeding is not stopped, apply pressure for another full 10 minutes by a clock. - Apply a cloth-covered ice pack to nose and cheeks. ~ 07/23/2012 4:43:19PM Page 1 of 1 [id number]

## 2012-07-26 NOTE — Telephone Encounter (Signed)
Refill once -avoid regular use

## 2012-07-26 NOTE — Telephone Encounter (Signed)
Diazepam last filled 04-05-12, #30 with 0 refills

## 2012-07-28 ENCOUNTER — Ambulatory Visit (INDEPENDENT_AMBULATORY_CARE_PROVIDER_SITE_OTHER): Payer: Medicare Other | Admitting: Family Medicine

## 2012-07-28 ENCOUNTER — Encounter: Payer: Self-pay | Admitting: Family Medicine

## 2012-07-28 VITALS — BP 136/70 | Temp 98.5°F

## 2012-07-28 DIAGNOSIS — R040 Epistaxis: Secondary | ICD-10-CM

## 2012-07-28 DIAGNOSIS — E119 Type 2 diabetes mellitus without complications: Secondary | ICD-10-CM | POA: Diagnosis not present

## 2012-07-28 DIAGNOSIS — I10 Essential (primary) hypertension: Secondary | ICD-10-CM | POA: Diagnosis not present

## 2012-07-28 NOTE — Progress Notes (Signed)
Subjective:    Patient ID: [name] [name], female    DOB: Jul 07, 1932, 79 y.o.   MRN: [id number]  HPI Patient here for emergency room followup. She presented there on 07/17/2012 with epistaxis. It was not determined which side her nosebleed occurred from. She takes Eliquis for atrial fibrillation. Denies any other bleeding complications. Her nosebleed apparently was treated with local pressure and Afrin nasal spray. She's had a couple mild nosebleed since then which promptly improved with local pressure. No recent headaches.  Blood pressures been stable with combination therapy. Requesting samples of Azor.  Hyperlipidemia treated with Crestor. Requesting samples. Denies recent chest pains. Blood sugars been stable. Recent A1c 7.0%  Past Medical History  Diagnosis Date  . HYPOTHYROIDISM 07/24/2008  . HYPERLIPIDEMIA 07/24/2008  . DEPRESSION 05/16/2009  . HYPERTENSION 07/24/2008  . Vertigo   . Diabetes mellitus type II   . Arthritis     r tkr  . CVA (cerebral vascular accident) 2008    mini stroke.no residual  . Heart murmur     stress test 2009.dr peter [place]  . Intermittent vertigo   . Skin abnormality     facial lesions .pt applying mupiracin to areas  . Atrial fibrillation   . Breast cancer    Past Surgical History  Procedure Laterality Date  . Knee surgery  2009    TKR  . Cholecystectomy  1980  . Joint replacement      r knee  . Breast surgery  2007    Lumpectomy, XRT 2006.l breast  . Video assisted thoracoscopy  04/22/2011    Procedure: VIDEO ASSISTED THORACOSCOPY;  Surgeon: Melrose Nakayama, MD;  Location: [facility];  Service: Thoracic;  Laterality: Left;  WITH BIOPSY    reports that she quit smoking about 30 years ago. Her smoking use included Cigarettes. She has a 6 pack-year smoking history. She does not have any smokeless tobacco history on file. She reports that she does not drink alcohol. Her drug history is not on file. family history includes Arthritis in her  mother; Breast cancer in her sister; Cancer in her sister; Coronary artery disease in her brother and father; Heart disease in her father; Lung cancer in her brother; and Rheum arthritis in her mother. Allergies  Allergen Reactions  . Amoxicillin     GI upset, pt says lower doses are ok.patient can take low dose  . Codeine Sulfate     REACTION: GI upset  . Sulfonamide Derivatives     REACTION: GI upset  . Atarax (Hydroxyzine) Nausea Only and Rash      Review of Systems  Eyes: Negative for visual disturbance.  Respiratory: Negative for cough, chest tightness, shortness of breath and wheezing.   Cardiovascular: Negative for chest pain, palpitations and leg swelling.  Neurological: Negative for seizures, syncope, weakness, light-headedness and headaches. Dizziness: Chronic intermittent vertigo unchanged.  Hematological: Does not bruise/bleed easily.       Objective:   Physical Exam  Constitutional: She appears well-developed and well-nourished.  HENT:  Nose: Nose normal.  Mouth/Throat: Oropharynx is clear and moist.  Neck: Neck supple. No thyromegaly present.  Cardiovascular: Normal rate and regular rhythm.   Pulmonary/Chest: Effort normal and breath sounds normal. No respiratory distress. She has no wheezes. She has no rales.  Musculoskeletal: She exhibits no edema.          Assessment & Plan:  #1 recent episode of epistaxis. No evidence for ongoing nosebleed. No obvious source today on exam. We again reviewed  how to treat acute nosebleed. #2 hypertension. Stable. Provided samples of Azor #3 hyperlipidemia. Provider samples of Crestor. #4 type 2 diabetes. Stable by recent labs. No recent hypoglycemia. Continue current medications. A1c at followup

## 2012-07-28 NOTE — Patient Instructions (Signed)
Nosebleed Nosebleeds can be caused by many conditions including trauma, infections, polyps, foreign bodies, dry mucous membranes or climate, medications and air conditioning. Most nosebleeds occur in the front of the nose. It is because of this location that most nosebleeds can be controlled by pinching the nostrils gently and continuously. Do this for at least 10 to 20 minutes. The reason for this long continuous pressure is that you must hold it long enough for the blood to clot. If during that 10 to 20 minute time period, pressure is released, the process may have to be started again. The nosebleed may stop by itself, quit with pressure, need concentrated heating (cautery) or stop with pressure from packing. HOME CARE INSTRUCTIONS   If your nose was packed, try to maintain the pack inside until your caregiver removes it. If a gauze pack was used and it starts to fall out, gently replace or cut the end off. Do not cut if a balloon catheter was used to pack the nose. Otherwise, do not remove unless instructed.  Avoid blowing your nose for 12 hours after treatment. This could dislodge the pack or clot and start bleeding again.  If the bleeding starts again, sit up and bending forward, gently pinch the front half of your nose continuously for 20 minutes.  If bleeding was caused by dry mucous membranes, cover the inside of your nose every morning with a petroleum or antibiotic ointment. Use your little fingertip as an applicator. Do this as needed during dry weather. This will keep the mucous membranes moist and allow them to heal.  Maintain humidity in your home by using less air conditioning or using a humidifier.  Do not use aspirin or medications which make bleeding more likely. Your caregiver can give you recommendations on this.  Resume normal activities as able but try to avoid straining, lifting or bending at the waist for several days.  If the nosebleeds become recurrent and the cause is  unknown, your caregiver may suggest laboratory tests. SEEK IMMEDIATE MEDICAL CARE IF:   Bleeding recurs and cannot be controlled.  There is unusual bleeding from or bruising on other parts of the body.  You have a fever.  Nosebleeds continue.  There is any worsening of the condition which originally brought you in.  You become lightheaded, feel faint, become sweaty or vomit blood. MAKE SURE YOU:   Understand these instructions.  Will watch your condition.  Will get help right away if you are not doing well or get worse. Document Released: 01/01/2005 Document Revised: 06/16/2011 Document Reviewed: 02/23/2009 [facility] Patient Information 2013 [place].

## 2012-07-30 ENCOUNTER — Other Ambulatory Visit: Payer: Self-pay | Admitting: *Deleted

## 2012-07-30 ENCOUNTER — Telehealth: Payer: Self-pay | Admitting: *Deleted

## 2012-07-30 NOTE — Telephone Encounter (Signed)
sw pt gv appt for lab on 08/02/12 @ 8:30am. Pt is aware...td

## 2012-08-02 ENCOUNTER — Other Ambulatory Visit (HOSPITAL_BASED_OUTPATIENT_CLINIC_OR_DEPARTMENT_OTHER): Payer: Medicare Other | Admitting: Lab

## 2012-08-02 ENCOUNTER — Ambulatory Visit (HOSPITAL_COMMUNITY)
Admission: RE | Admit: 2012-08-02 | Discharge: 2012-08-02 | Disposition: A | Discharge status: Home / Self Care | Payer: Medicare Other | Source: Ambulatory Visit | Attending: Oncology | Admitting: Oncology

## 2012-08-02 DIAGNOSIS — C7A00 Malignant carcinoid tumor of unspecified site: Secondary | ICD-10-CM | POA: Diagnosis not present

## 2012-08-02 DIAGNOSIS — J984 Other disorders of lung: Secondary | ICD-10-CM | POA: Diagnosis not present

## 2012-08-02 DIAGNOSIS — J438 Other emphysema: Secondary | ICD-10-CM | POA: Diagnosis not present

## 2012-08-02 DIAGNOSIS — C7B8 Other secondary neuroendocrine tumors: Secondary | ICD-10-CM

## 2012-08-02 DIAGNOSIS — C7A029 Malignant carcinoid tumor of the large intestine, unspecified portion: Secondary | ICD-10-CM

## 2012-08-02 DIAGNOSIS — R911 Solitary pulmonary nodule: Secondary | ICD-10-CM | POA: Diagnosis not present

## 2012-08-02 DIAGNOSIS — Z853 Personal history of malignant neoplasm of breast: Secondary | ICD-10-CM | POA: Insufficient documentation

## 2012-08-02 DIAGNOSIS — C50919 Malignant neoplasm of unspecified site of unspecified female breast: Secondary | ICD-10-CM | POA: Diagnosis not present

## 2012-08-02 DIAGNOSIS — I319 Disease of pericardium, unspecified: Secondary | ICD-10-CM | POA: Diagnosis not present

## 2012-08-02 DIAGNOSIS — Z9010 Acquired absence of unspecified breast and nipple: Secondary | ICD-10-CM | POA: Insufficient documentation

## 2012-08-02 LAB — COMPREHENSIVE METABOLIC PANEL (CC13)
AST: 18 U/L (ref 5–34)
Albumin: 3.3 g/dL — ABNORMAL LOW (ref 3.5–5.0)
Alkaline Phosphatase: 54 U/L (ref 40–150)
BUN: 17.9 mg/dL (ref 7.0–26.0)
Creatinine: 1.3 mg/dL — ABNORMAL HIGH (ref 0.6–1.1)
Potassium: 4.6 mEq/L (ref 3.5–5.1)

## 2012-08-02 LAB — CBC WITH DIFFERENTIAL/PLATELET
Basophils Absolute: 0 10*3/uL (ref 0.0–0.1)
EOS%: 3.8 % (ref 0.0–7.0)
HGB: 10.1 g/dL — ABNORMAL LOW (ref 11.6–15.9)
MCH: 32.9 pg (ref 25.1–34.0)
MCV: 93.4 fL (ref 79.5–101.0)
MONO%: 12.8 % (ref 0.0–14.0)
RDW: 12.8 % (ref 11.2–14.5)

## 2012-08-02 MED ORDER — IOHEXOL 300 MG/ML  SOLN
80.0000 mL | Freq: Once | INTRAMUSCULAR | Status: AC | PRN
  Administered 2012-08-02: 80 mL via INTRAVENOUS

## 2012-08-09 ENCOUNTER — Encounter: Payer: Self-pay | Admitting: Internal Medicine

## 2012-08-09 ENCOUNTER — Ambulatory Visit (INDEPENDENT_AMBULATORY_CARE_PROVIDER_SITE_OTHER): Payer: Medicare Other | Admitting: Internal Medicine

## 2012-08-09 DIAGNOSIS — L299 Pruritus, unspecified: Secondary | ICD-10-CM

## 2012-08-09 DIAGNOSIS — J84116 Cryptogenic organizing pneumonia: Secondary | ICD-10-CM

## 2012-08-09 NOTE — Patient Instructions (Addendum)
We will plan on repeat CT in 1 year unless needed sooner to watch the spot inyour lung  Please call as needed

## 2012-08-09 NOTE — Progress Notes (Signed)
04/03/11- 79 year old female former smoking history referred courtesy of Dr. Elease Hashimoto with concern of abnormal chest CT. Husband is an allergy patient here. Dr. Erick Blinks recent office note reviewed "Followup from recent pneumonia. Patient had presented here with extreme myalgias and chronic pain along with bilateral shoulder hip pain. Very high sedimentation rate of 120.  She almost total resolution of myalgias with low dose prednisone strongly suggesting polymyalgia rheumatica. She subsequently presented with cough with trace of blood but no dyspnea, fever, reported a pain. Chest x-ray showed bilateral infiltrates suggesting probable bilateral pneumonia the recommendation for CT scan. CT scan revealed no adenopathy and no evidence for a lung mass. Compatible with bilateral pneumonia. Patient placed on antibiotic and much better at this time with no fever and essentially no cough. Her muscle and joint pains are almost 100% resolved with low-dose prednisone. She did present back in September with severe sinus disease suggesting acute sinusitis. CT revealed no acute findings. She denies any arthralgias at this time. No skin rash. No fever." She has been on prednisone 10 mg twice daily since December 13 and feels significantly better. Morning cough has cleared with residual trace phlegm and trace blood. No chest pain. CT chest 03/23/2011 -images reviewed by me-bilateral patchy airspace disease with air bronchograms. No cavitation or definite nodularity seen. She has had right total knee replacement but no generalized arthritis. Childhood exposure to an uncle with tuberculosis. Her PPD skin test was negative. She denies history of lung disease, GERD, DVT, kidney or liver disease. Lab- C-ANCA POS 1:320, P-ANCA NEG. RA 1:64, ANA NEG46 year old female former smoking history referred courtesy of Dr. Elease Hashimoto with concern of abnormal chest CT. Husband is an allergy patient here.  05/13/11- 56 yoF former smoker  followed for bilateral pulmonary infiltrates, incr C-ANCA, s/p VATS bx/ organizing pneumonia, Metastatic intestinal carcinoid. Marland Kitchen Hx breast Ca 2006/ Dr Truddie Coco Husband here. She returns now after left lung biopsy. She has expected left thoracotomy pain but is otherwise stable as she continues maintenance prednisone. Denies blood, fever, swollen glands, discolored sputum. Exertional dyspnea is not worse, allowing for the surgery. I shared with her and her husband the available initial pathology: Patchy organizing pneumonia with chronic interstitial pneumonia and focal atypical glands. I reviewed the discussion about her at thoracic oncology conference. The parenchymal lung process is not a vasculitis as I originally questioned. I told her there was an incidental finding of cancer cells with further identification pending but that this was a separate process. We agreed to refer her to Dr. Truddie Coco who managed her breast cancer. After she had left, personal communication from Dr. Jimmy Footman- neuroendocrine tumor staining consistent with metastatic intestinal carcinoid.  06/20/11  56 yoF former smoker followed for bilateral pulmonary infiltrates, incr C-ANCA, s/p VATS bx/ organizing pneumonia, Metastatic intestinal carcinoid. Marland Kitchen Hx breast Ca 2006/ Dr Truddie Coco   Husband here She says her breathing is now "fine" back to her normal. She denies fever, cough, sputum. I spoke with Mrs. Loughney myself and had to explain the discovery of carcinoid after her lung biopsy. We also have the note from the [facility] indicating they discussed it with her. She has absolutely no recollection. Little cough or shortness of breath now. Still a little posterior thoracotomy incision pain. CT 05/19/11- images reviewed with them- IMPRESSION:  1. Marked improvement in bilateral air space disease seen on  comparison CT of 03/24/2011. Near complete resolution.  2. Postoperative change in the left hemithorax consistent with  recent VATS.   Original Report  Authenticated By: Suzy Bouchard, M.D.   11/07/11- 5 yoF former smoker followed for bilateral pulmonary infiltrates, incr C-ANCA, s/p VATS bx/ organizing pneumonia, Metastatic intestinal carcinoid. Marland Kitchen Hx breast Ca 2006/ Dr Truddie Coco    c/o  Chronic vertigo, difficulty breathing and gets tired easy. Also some wheezing, post nasal,  and cough.  Some days she feels much better than others, no real pattern. Occasional light cough to clear throat. We had called in prednisone, which did help. Ambulatory around home, but gets out little. CT chest 09/09/11- I reviewed w/ her- there is significant patchy bilateral lower zone fibrotic scarring and some bronchial thickening. IMPRESSION:  1. No evidence of thoracic metastatic disease or other active  process.  2. Small mild cardiomegaly and hiatal hernia.  Original Report Authenticated By: Marlaine Hind, M.D.    02/09/12-  51 yoF former smoker followed for bilateral pulmonary infiltrates, incr C-ANCA, s/p VATS bx/ cryptogenic organizing pneumonia/ BOOP, Metastatic intestinal carcinoid. Marland Kitchen Hx breast Ca 2006/ Dr Truddie Coco  January had biopsy to ck for other lung condition. SOB and wheezing at times. Left sided pain around the  Back. Continues Advair 250. Due for followup chest CT in December for Dr. Jacquiline Doe. Not on prednisone. She says breathing is unchanged. Denies pain, blood or swollen nodes.   05/11/12-  68 yoF former smoker followed for bilateral pulmonary infiltrates, incr C-ANCA, s/p VATS bx/ cryptogenic organizing pneumonia/ BOOP, Metastatic intestinal carcinoid. Complicated by Hx breast Ca 2006/ Dr Truddie Coco. AFib/ Dr Martinique, DM. She says her husband is developing dementia. Deep breath makes her left flank feel tight where she had thoracentesis. Complains of itching since starting Eliquis.Atarax 50 mg was too strong and Benadryl did not help. Itching keeps her awake all night. No visible rash. She is convinced really unpleasant itching began when  she started Eliquis. I explained this medication needs to be transitioned carefully to anything else and Dr. Martinique would need to be the one to manage that. CT chest 03/26/12 . IMPRESSION:  1. No findings to strongly suggest metastatic disease to the  thorax.  2. There is a new focus of nodular ground-glass attenuation  measuring 1 cm in the anterior aspect of the left upper lobe which  is highly nonspecific. Initial follow-up by chest CT without  contrast is recommended in 3 months to confirm persistence. This  recommendation follows the consensus statement: Recommendations for  the Management of Subsolid Pulmonary Nodules Detected at CT: A  Statement from the [employer] Society as published in Radiology  2013; 266:304-317.  3. Atherosclerosis, including left main and three-vessel coronary  artery disease. Assessment for potential risk factor  modification, dietary therapy or pharmacologic therapy may be  warranted, if clinically indicated.  4. Slight increase in the small amount of pericardial fluid  compared to the prior examination. This is unlikely to be of  hemodynamic significance at this time.  5. Moderate cardiomegaly.  6. Additional incidental findings, similar to prior studies, as  discussed above.  Original Report Authenticated By: Vinnie Langton, M.D.   08/08/12- 76 yoF former smoker followed for bilateral pulmonary infiltrates/ nodules, incr C-ANCA, s/p VATS bx/ cryptogenic organizing pneumonia/ BOOP, Metastatic intestinal carcinoid. Complicated by Hx breast Ca 2006/ Dr Truddie Coco. AFib/ Dr Martinique, DM. Itching stopped, while continuing Eliquis. She had changed detergents and fabric softener, but cause unknown. Seasonal rhinitis symptoms now. Total IgE 05/11/2012 was less than 1.5. New burning itch, intermittent, right lateral ribs x2 weeks. Has had shingles shot. No visible rash. CT chest  08/02/12 IMPRESSION:  Interval persistence but decrease in size of the ground-glass   nodules seen in the anterior left upper lobe. Adenocarcinoma  cannot be excluded. Followup by CT is recommended in 12 months,  with continued annual surveillance for a minimum of 3 years.  These recommendations are taken from "Recommendations for the  Management of Subsolid Pulmonary Nodules Detected at CT: A  Statement from the [place]" Radiology 2013; 266:1, 304-  317.  Original Report Authenticated By: Misty Stanley, M.D.   ROS-see HPI Constitutional:   No-   weight loss, night sweats, fevers, chills, fatigue, lassitude. HEENT:   No-  headaches, difficulty swallowing, tooth/dental problems, sore throat,       No-  sneezing, +itching, ear ache, nasal congestion, post nasal drip,  CV: +chest pain, orthopnea, PND, swelling in lower extremities, anasarca,   + chronic dizziness,   No-palpitations Resp: +   shortness of breath with exertion or at rest.              Minimal  productive cough,  No non-productive cough,               No-   change in color of mucus.  No- wheezing.   Skin: No-   rash or lesions.+ intense pruritus GI:  No-   heartburn, indigestion, abdominal pain, nausea, vomiting,  GU: MS:  No-   joint pain or swelling.  Neuro-     +Vertigo- uses wheelchair Psych:  No- change in mood or affect. No depression or anxiety.  No memory loss.  OBJ General- Alert, Oriented, Affect-appropriate, Distress- none acute, overweight, wheelchair Skin- rash-none, lesions- none, excoriation- none Lymphadenopathy- none Head- atraumatic            Eyes- Gross vision intact, PERRLA, conjunctivae -pale            Ears- Hearing, canals-normal            Nose- turbinate edema, no-Septal dev, mucus, polyps, erosion, perforation             Throat- Mallampati II-III , mucosa clear , drainage- none, tonsils- atrophic Neck- flexible , trachea midline, no stridor , thyroid nl, carotid no bruit Chest - symmetrical excursion , unlabored           Heart/CV-+ irregularly irregular , 2-3/6  precordial systolic murmur , no gallop  , no rub, nl s1 s2                           - JVD- none , edema- none, stasis changes- none, varices- none           Lung- clear, wheeze+trace, cough- none , dullness-none, rub- none           Chest wall- healed VATS thoracotomy incision L Abd-  Br/ Gen/ Rectal- Not done, not indicated Extrem- cyanosis- none, clubbing, none, atrophy- none, strength- nl. Wheelchair for distance/ cane at home. Limited mobility R shoulder -extension limited by pain. Neuro- grossly intact to observation

## 2012-08-19 NOTE — Assessment & Plan Note (Signed)
Area of concern now is right lateral chest wall. Watch for shingles

## 2012-08-19 NOTE — Assessment & Plan Note (Signed)
Continuing to follow nodular infiltrates, smaller on most recent CT. Radiologist recommends another look in 12 months. There is bronchiectatic scarring in lower zones

## 2012-09-01 ENCOUNTER — Ambulatory Visit (INDEPENDENT_AMBULATORY_CARE_PROVIDER_SITE_OTHER): Payer: Medicare Other | Admitting: Family Medicine

## 2012-09-01 ENCOUNTER — Encounter: Payer: Self-pay | Admitting: Family Medicine

## 2012-09-01 VITALS — BP 148/82 | Temp 97.6°F

## 2012-09-01 DIAGNOSIS — IMO0002 Reserved for concepts with insufficient information to code with codable children: Secondary | ICD-10-CM | POA: Diagnosis not present

## 2012-09-01 DIAGNOSIS — M792 Neuralgia and neuritis, unspecified: Secondary | ICD-10-CM

## 2012-09-01 MED ORDER — GABAPENTIN 300 MG PO CAPS: ORAL_CAPSULE | ORAL | Status: DC

## 2012-09-01 NOTE — Progress Notes (Signed)
Chief Complaint  Patient presents with  . right side pain    that wraps around ribs x 2 weeks or more    HPI:  Acute visit for ? Shingles: -burning sensation R back, side and around under R breast, sensitive to light touch -started 1-2 weeks ago -she is worried about shingles - reports her lung doctor told her he thinks it is shingles -does not have a rash -denies: fevers, nausea, malaise, HA -had shingles vaccine -ice pack helps it  ROS: See pertinent positives and negatives per HPI.  Past Medical History  Diagnosis Date  . HYPOTHYROIDISM 07/24/2008  . HYPERLIPIDEMIA 07/24/2008  . DEPRESSION 05/16/2009  . HYPERTENSION 07/24/2008  . Vertigo   . Diabetes mellitus type II   . Arthritis     r tkr  . CVA (cerebral vascular accident) 2008    mini stroke.no residual  . Heart murmur     stress test 2009.dr peter Martinique  . Intermittent vertigo   . Skin abnormality     facial lesions .pt applying mupiracin to areas  . Atrial fibrillation   . Breast cancer     Family History  Problem Relation Age of Onset  . Arthritis Mother   . Rheum arthritis Mother   . Heart disease Father   . Coronary artery disease Father   . Cancer Sister     breast CA, both sisters  . Breast cancer Sister   . Coronary artery disease Brother   . Lung cancer Brother     History   Social History  . Marital Status: Married    Spouse Name: N/A    Number of Children: N/A  . Years of Education: N/A   Social History Main Topics  . Smoking status: Former Smoker -- 0.50 packs/day for 12 years    Types: Cigarettes    Quit date: 06/04/1982  . Smokeless tobacco: None  . Alcohol Use: No  . Drug Use: None  . Sexually Active: Yes    Birth Control/ Protection: Post-menopausal   Other Topics Concern  . None   Social History Narrative  . None    Current outpatient prescriptions:apixaban (ELIQUIS) 5 MG TABS tablet, Take 1 tablet (5 mg total) by mouth 2 (two) times daily., Disp: 60 tablet, Rfl: 6;   AZOR 5-40 MG per tablet, TAKE ONE TABLET BY MOUTH EVERY DAY, Disp: 90 tablet, Rfl: 3;  BYSTOLIC 10 MG tablet, TAKE ONE TABLET BY MOUTH EVERY DAY, Disp: 90 tablet, Rfl: 3;  CALCIUM CITRATE PO, Take 1,200 mg by mouth daily.  , Disp: , Rfl:  cholecalciferol (VITAMIN D) 1000 UNITS tablet, Take 3,000 Units by mouth daily.  , Disp: , Rfl: ;  diazepam (VALIUM) 5 MG tablet, TAKE ONE TABLET BY MOUTH EVERY DAY AS NEEDED., Disp: 30 tablet, Rfl: 0;  FeFum-FePo-FA-B Cmp-C-Zn-Mn-Cu (SE-TAN PLUS) 162-115.2-1 MG CAPS, TAKE ONE CAPSULE BY MOUTH EVERY DAY, Disp: 30 capsule, Rfl: 11;  furosemide (LASIX) 20 MG tablet, Take 1 tablet (20 mg total) by mouth daily., Disp: 30 tablet, Rfl: 0 glipiZIDE (GLUCOTROL) 10 MG tablet, TAKE ONE TABLET BY MOUTH TWICE DAILY, Disp: 180 tablet, Rfl: 3;  levothyroxine (SYNTHROID, LEVOTHROID) 125 MCG tablet, TAKE ONE TABLET BY MOUTH EVERY DAY, Disp: 90 tablet, Rfl: 3;  loratadine (CLARITIN) 10 MG tablet, Take 10 mg by mouth daily., Disp: , Rfl: ;  metFORMIN (GLUCOPHAGE-XR) 500 MG 24 hr tablet, TAKE TWO TABLETS BY MOUTH TWICE DAILY, Disp: 120 tablet, Rfl: 11 multivitamin (METANX) 3-35-2 MG TABS tablet, TAKE  ONE TABLET BY MOUTH EVERY DAY, Disp: 90 tablet, Rfl: 3;  Pyridoxine HCl (VITAMIN B-6 PO), Take by mouth daily., Disp: , Rfl: ;  rosuvastatin (CRESTOR) 5 MG tablet, Take 1 tablet (5 mg total) by mouth daily., Disp: 30 tablet, Rfl: 11  EXAM:  Filed Vitals:   09/01/12 0913  BP: 148/82  Temp: 97.6 F (36.4 C)    Body mass index is 0.00 kg/(m^2).  GENERAL: vitals reviewed and listed above, alert, oriented, appears well hydrated and in no acute distress  HEENT: atraumatic, conjunttiva clear, no obvious abnormalities on inspection of external nose and ears  NECK: no obvious masses on inspection  LUNGS: clear to auscultation bilaterally, no wheezes, rales or rhonchi, good air movement  CV: HRRR, no peripheral edema  SKIN: some dry skin on trunk, no rash, sensitive to light touch in  small dermatomal pattern on R side, back, under R breast  MS: moves all extremities without noticeable abnormality - no TTP soft tissues or ribs  PSYCH: pleasant and cooperative, no obvious depression or anxiety  ASSESSMENT AND PLAN:  Discussed the following assessment and plan:  Neuropathic pain  -pain seems nerve related - query very mild shingles or cutaneous nerve entrapment or neuropathy. Also has xerosis of skin - less likely cause. Will tx with cerave moisturizer, neurontin (risks discussed) and follow on in 2-3 weeks or sooner if any rash develops. -Patient advised to return or notify a doctor immediately if symptoms worsen or persist or new concerns arise.  There are no Patient Instructions on file for this visit.   Colin Benton R.

## 2012-09-14 ENCOUNTER — Telehealth: Payer: Self-pay | Admitting: *Deleted

## 2012-09-14 ENCOUNTER — Encounter: Payer: Self-pay | Admitting: Family

## 2012-09-14 ENCOUNTER — Ambulatory Visit (HOSPITAL_BASED_OUTPATIENT_CLINIC_OR_DEPARTMENT_OTHER): Payer: Medicare Other | Admitting: Family

## 2012-09-14 VITALS — BP 149/81 | HR 76 | Temp 97.8°F | Resp 20 | Ht 67.0 in | Wt 207.4 lb

## 2012-09-14 DIAGNOSIS — C50912 Malignant neoplasm of unspecified site of left female breast: Secondary | ICD-10-CM

## 2012-09-14 DIAGNOSIS — C50919 Malignant neoplasm of unspecified site of unspecified female breast: Secondary | ICD-10-CM | POA: Insufficient documentation

## 2012-09-14 DIAGNOSIS — Z853 Personal history of malignant neoplasm of breast: Secondary | ICD-10-CM | POA: Diagnosis not present

## 2012-09-14 NOTE — Progress Notes (Addendum)
[name] [name]  [phone] [phone]  OFFICE PROGRESS NOTE   ID: [name]   DOB: 11-Jul-1932  MR#: [id number]  IRJ#:[id number]   PCP: [name], MD GYN: Juanda Chance, WHNP-C SU: Alphonsa Overall, MD RAD ONC: Rexene Edison, MD CARDIO: Peter M.  Martinique, M.D. PULM: Clinton D.  Annamaria Boots, M.D.   HISTORY OF PRESENT ILLNESS: From Dr. Collier Salina Rubin's new patient evaluation note dated 10/01/2005: "This is a 79 year old woman referred by Dr. Lucia Gaskins for evaluation and treatment of breast cancer.  This woman undergoes annual screening mammography.  She underwent initial mammogram on 08/06/2005.  This showed a suspicious mass in the left breast.  A diagnostic mammogram performed on 08/19/2005 showed a 5 x 4 x 4 mm spiculated mass with microcalfication of the left anterior upper outer quadrant at 1 o'clock.  No palpable mass was identified.  Ultrasound showed hypoechoic mass with some shadowing and microcalcifications.  Biopsy was performed on 08/19/2005 and this showed an invasive mammary carcinoma intermediate grade, ER/PR positive at 94% and 86% respectively, proliferative index 9%, HER-2/neu was 2+ with no overexpression by FISH.  No polysomy was seen with adjuvant in her chromosome 17 of 3.  MRI scan of both breasts was performed showing a solitary abnormality in the left breast and right breast was normal.  The patient elected to undergo lumpectomy and sentinel lymph node evaluation on 09/09/2005.  The final pathology showed a 0.9 cm grade 3 of 3 invasive ductal cancer.  Lymphovascular invasion was not seen though lymphovascular invasion was identified in the original biopsy.  The solitary lymph node, which was identified, was negative for malignancy.  She has had an unremarkable postoperative course."  Her subsequent history is as detailed below.   INTERVAL HISTORY: Dr. Jana Hakim and I saw [name] [name] today for follow up of invasive ductal carcinoma with ductal  carcinoma in situ of the left breast and possible neuroendocrine type lung carcinoma.  The patient is accompanied to today's office visit by her husband [name] [name].  The patient was last seen by Dr. Truddie Coco on 04/02/2012.  Since her last office visit, the patient has been doing relatively well.  She is establishing herself with Dr. Virgie Dad service today.  REVIEW OF SYSTEMS: A 10 point review of systems was completed and is negative except the patient stated she had a severe epistaxis episode and went to the ER in 07/2012.  Of note, the patient takes Eliquis due to her history of atrial fibrillation.  The patient also states she has an ongoing history of vertigo which is controlled when she takes Valium.  The patient also mentioned an isolated episode in which her left flank area had discomfort in which she described as a "burning"sensation that was sensitive to the touch.  Her left flank area discomfort spontaneously resolved. The patient denies any other symptomatology.   PAST MEDICAL HISTORY: Past Medical History  Diagnosis Date  . HYPOTHYROIDISM 07/24/2008  . HYPERLIPIDEMIA 07/24/2008  . DEPRESSION 05/16/2009  . HYPERTENSION 07/24/2008  . Vertigo   . Diabetes mellitus type II   . Arthritis     r tkr  . CVA (cerebral vascular accident) 2008    mini stroke.no residual  . Heart murmur     stress test 2009.dr peter Martinique  . Intermittent vertigo   . Skin abnormality     facial lesions .pt applying mupiracin to areas  . Atrial fibrillation   . Breast cancer     PAST SURGICAL  HISTORY: Past Surgical History  Procedure Laterality Date  . Knee surgery  2009    TKR  . Cholecystectomy  1980  . Joint replacement      r knee  . Breast surgery  2007    Lumpectomy, XRT 2006.l breast  . Video assisted thoracoscopy  04/22/2011    Procedure: VIDEO ASSISTED THORACOSCOPY;  Surgeon: Melrose Nakayama, MD;  Location: [facility];  Service: Thoracic;  Laterality: Left;  WITH BIOPSY  Include  cholecystectomy with exploratory laparotomy, and right heel surgery.   FAMILY HISTORY Family History  Problem Relation Age of Onset  . Arthritis Mother   . Rheum arthritis Mother   . Heart disease Father   . Coronary artery disease Father   . Cancer Sister     breast CA, both sisters  . Breast cancer Sister   . Coronary artery disease Brother   . Lung cancer Brother   Is significant for parents both deceased.  She has had one brother who died of lung cancer.  She has had two sisters with breast cancer, one dying at age 1.  She has had one diagnosed at age 89 and it recurred 11 years later.  She had one brother who died of pancreatic cancer as well.   GYNECOLOGIC HISTORY: She is gravida 3, para 3.    Menarche age 52 and menopause in her 34s.  She was on hormone replacement therapy with Premarin, Provera for 15-20 years.  SOCIAL HISTORY: Mr. and Mrs. Silverstein have been married since 68.  They have 3 adult sons, 7 grandchildren, and 6 great-grandchildren.   She has retired from [employer] as a Administrator.  Her husband is also retired from [employer] in 1996. in her spare time the patient enjoys cooking/baking and reading.   ADVANCED DIRECTIVES: Not on file  HEALTH MAINTENANCE: History  Substance Use Topics  . Smoking status: Former Smoker -- 0.50 packs/day for 12 years    Types: Cigarettes    Quit date: 06/04/1982  . Smokeless tobacco: Never Used  . Alcohol Use: No    Colonoscopy: 05/05/2003 PAP: Not on file Bone density: Not on file Lipid panel: Not on file  Allergies  Allergen Reactions  . Amoxicillin     GI upset, pt says lower doses are ok.patient can take low dose  . Codeine Sulfate     REACTION: GI upset  . Sulfonamide Derivatives     REACTION: GI upset  . Atarax (Hydroxyzine) Nausea Only and Rash    Current Outpatient Prescriptions  Medication Sig Dispense Refill  . apixaban (ELIQUIS) 5 MG TABS tablet Take 1 tablet (5 mg total)  by mouth 2 (two) times daily.  60 tablet  6  . AZOR 5-40 MG per tablet TAKE ONE TABLET BY MOUTH EVERY DAY  90 tablet  3  . BYSTOLIC 10 MG tablet TAKE ONE TABLET BY MOUTH EVERY DAY  90 tablet  3  . CALCIUM CITRATE PO Take 1,200 mg by mouth daily.        . cholecalciferol (VITAMIN D) 1000 UNITS tablet Take 3,000 Units by mouth daily.        . diazepam (VALIUM) 5 MG tablet TAKE ONE TABLET BY MOUTH EVERY DAY AS NEEDED.  30 tablet  0  . FeFum-FePo-FA-B Cmp-C-Zn-Mn-Cu (SE-TAN PLUS) 162-115.2-1 MG CAPS TAKE ONE CAPSULE BY MOUTH EVERY DAY  30 capsule  11  . Fluticasone-Salmeterol (ADVAIR) 250-50 MCG/DOSE AEPB Inhale 1 puff into the lungs every 12 (twelve)  hours.      . furosemide (LASIX) 20 MG tablet Take 1 tablet (20 mg total) by mouth daily.  30 tablet  0  . glipiZIDE (GLUCOTROL) 10 MG tablet TAKE ONE TABLET BY MOUTH TWICE DAILY  180 tablet  3  . levothyroxine (SYNTHROID, LEVOTHROID) 125 MCG tablet TAKE ONE TABLET BY MOUTH EVERY DAY  90 tablet  3  . loratadine (CLARITIN) 10 MG tablet Take 10 mg by mouth daily.      . metFORMIN (GLUCOPHAGE-XR) 500 MG 24 hr tablet TAKE TWO TABLETS BY MOUTH TWICE DAILY  120 tablet  11  . multivitamin (METANX) 3-35-2 MG TABS tablet TAKE ONE TABLET BY MOUTH EVERY DAY  90 tablet  3  . ondansetron (ZOFRAN) 4 MG tablet Take 4 mg by mouth every 8 (eight) hours as needed for nausea.      . Pyridoxine HCl (VITAMIN B-6 PO) Take 100 mg by mouth daily.       . rosuvastatin (CRESTOR) 5 MG tablet Take 1 tablet (5 mg total) by mouth daily.  30 tablet  11  . triamcinolone cream (KENALOG) 0.1 % Apply 1 application topically 2 (two) times daily. Compound 1:1 with Eucerin cream       No current facility-administered medications for this visit.    OBJECTIVE: Filed Vitals:   09/14/12 0912  BP: 149/81  Pulse: 76  Temp: 97.8 F (36.6 C)  Resp: 20     Body mass index is 32.48 kg/(m^2).     O2 sats 95% on room air  ECOG FS: 2 - Symptomatic, <50% confined to bed  General  appearance: Alert, cooperative, well nourished, no apparent distress, in a wheelchair (due to vertigo) Head: Normocephalic, without obvious abnormality, atraumatic Eyes: Arcus senilis, PERRLA, EOMI Nose: Nares, septum and mucosa are normal, no drainage or sinus tenderness Neck: No adenopathy, supple, symmetrical, trachea midline, thyroid not enlarged, no tenderness Resp: Clear to auscultation bilaterally Cardio: Irregularly irregular, no murmur, click, rub or gallop Breasts: Pendulous bilaterally, left breast has well-healed surgical scars, no lymphadenopathy, no nipple inversion, no axilla fullness GI: Soft, distended, non-tender, hypoactive bowel sounds, no organomegaly Extremities: Extremities normal, atraumatic, no cyanosis or edema Lymph nodes: Cervical, supraclavicular, and axillary nodes normal Neurologic: Grossly normal    LAB RESULTS: Lab Results  Component Value Date   WBC 6.7 08/02/2012   NEUTROABS 3.7 08/02/2012   HGB 10.1* 08/02/2012   HCT 28.6* 08/02/2012   MCV 93.4 08/02/2012   PLT 227 08/02/2012      Chemistry      Component Value Date/Time   NA 132* 08/02/2012 0824   NA 132* 02/20/2012 1015   NA 136 09/09/2011 0939   K 4.6 08/02/2012 0824   K 4.5 02/20/2012 1015   K 4.7 09/09/2011 0939   CL 98 08/02/2012 0824   CL 99 02/20/2012 1015   CL 95* 09/09/2011 0939   CO2 25 08/02/2012 0824   CO2 27 02/20/2012 1015   CO2 29 09/09/2011 0939   BUN 17.9 08/02/2012 0824   BUN 12 02/20/2012 1015   BUN 17 09/09/2011 0939   CREATININE 1.3* 08/02/2012 0824   CREATININE 1.1 02/20/2012 1015   CREATININE 1.0 09/09/2011 0939      Component Value Date/Time   CALCIUM 9.0 08/02/2012 0824   CALCIUM 9.4 02/20/2012 1015   CALCIUM 9.0 09/09/2011 0939   ALKPHOS 54 08/02/2012 0824   ALKPHOS 43 02/20/2012 1015   ALKPHOS 45 09/09/2011 0939   AST 18 08/02/2012 0824  AST 23 02/20/2012 1015   AST 26 09/09/2011 0939   ALT 14 08/02/2012 0824   ALT 15 02/20/2012 1015   BILITOT 0.51 08/02/2012 0824   BILITOT 0.6  02/20/2012 1015   BILITOT 0.60 09/09/2011 0939       Lab Results  Component Value Date   LABCA2 28 09/09/2011    Urinalysis    Component Value Date/Time   COLORURINE YELLOW 04/18/2011 1348   APPEARANCEUR CLEAR 04/18/2011 1348   LABSPEC 1.007 04/18/2011 1348   PHURINE 7.0 04/18/2011 1348   GLUCOSEU NEGATIVE 04/18/2011 1348   HGBUR LARGE* 04/18/2011 1348   BILIRUBINUR NEGATIVE 04/18/2011 1348   BILIRUBINUR n 12/30/2010 1700   KETONESUR NEGATIVE 04/18/2011 1348   PROTEINUR 100* 04/18/2011 1348   UROBILINOGEN 0.2 04/18/2011 1348   UROBILINOGEN 0.2 12/30/2010 1700   NITRITE NEGATIVE 04/18/2011 1348   NITRITE n 12/30/2010 1700   LEUKOCYTESUR MODERATE* 04/18/2011 1348    STUDIES: No results found.  ASSESSMENT: 79 y.o. [place], [place] woman: 1.  Status post left breast lumpectomy with left axillary sentinel node biopsy on 09/09/2005 for a, stage I, pT1b pN0 (i-) (sn), 0.9 cm invasive ductal carcinoma grade 3, with high-grade ductal carcinoma in situ, estrogen receptor 94% positive, progesterone receptor 86% positive, Ki-67 9%, HER-2/neu 2+ by FISH H. 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  2.  Oncotype DX report dated 10/13/2005 showed a recurrent score of 19 with average rate of distant recurrence at 12%.  3.  Status post radiation therapy from 11/11/2005 through 12/26/2005.  4.  The patient started antiestrogen therapy with Arimidex in 01/2006.  The patient also started Fosamax at that time.  She completed antiestrogen therapy in 01/2011.  5.  The patient was being treated by her primary care physician for pneumonia in 04/2011. She was subsequently seen by a pulmonologist who in turn was concerned about Wegener's disease. He referred her for a lung biopsy. VATS lung biopsy was performed by Dr. Roxan Hockey on 04/22/2011. The patient had a PET scan on 05/21/2011 which showed no clear evidence of malignancy.  Uptake along the left lateral  chest wall was likely related to surgery.  Interval improvement of airspace disease within the lungs.  No evidence of malignancy within the abdomen or pelvis.    6.  Dr. Truddie Coco noted that the patient did have evidence of atypical glandular cells in her pathology from the lung biopsy.  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  Her last CT of the chest with contrast on 08/02/2012 showed  an interval persistence but decrease in size of the ground-glass nodules seen in the anterior left upper lobe.  Adenocarcinoma cannot be excluded.  Followup by CT is recommended in 12 months, with continued annual surveillance for a minimum of 3 years.  7.  The patient's last bilateral digital diagnostic mammogram on 06/17/2012 showed a magnification view of the lumpectomy site and routine views of both breasts again demonstrate scarring within the upper outer left breast. There is no evidence of suspicious mass, nonsurgical distortion or worrisome calcifications bilaterally.  Benign findings.  PLAN: We plan to see the patient again in one year (09/2013).  We will check laboratories of CBC, CMP, LDH, and vitamin D level at that time.  We will schedule her annual bilateral digital diagnostic mammogram for 06/2013.  We will also schedule her to have a follow up CT of the chest with contrast in 07/2013.  Her last  CT of the chest with contrast on 08/02/2012 recommended annual surveillance of the nodules seen in the anterior left upper lobe for a minimum of 3 years.  .  All questions were answered.  The patient and her husband were encouraged to contact us in the interim with any problems, questions or concerns.   Ailene Ards, NP-C 09/15/2012, 9:28 AM  ADDENDUM: This 79 year old [place] woman establish herself in my practice today. I gave her a written account of her diagnosis and treatment plan. In brief: She underwent a left lumpectomy and sentinel lymph node sampling June of  2007 for a pT1b pN0, stage IA invasive ductal carcinoma, grade 3, strongly estrogen and progesterone receptor positive, with a low proliferation fraction and no HER-2 amplification. Her Oncotype DX predicted a 12% risk of distant recurrence within 10 years if her only adjuvant treatment was tamoxifen for 5 years.  She completed radiation treatments September of 2007 and started Arimidex in October of 2007, completing 5 years October of 2012. She was also treated with Fosamax because of concerns regarding osteoporosis with aromatase inhibitors.  She understands that 5 years of aromatase inhibitor is superior to 5 years of tamoxifen in terms of risk reduction, by 2-3%. According to her final risk of this cancer coming back we'll be less than 10%.  We could have released the patient from followup here at this point, except for the fact that in January of 2013 she was found to have suspicious changes in her lung and underwent VATS under Cecille Rubin, the biopsy showing mostly chronic interstitial pneumonia, but with "focal atypical glands" noted by the consulting pathologist ([id number]). Accordingly the patient is being followed closely under Clint Young and just had a restaging chest CT April of this year showing persistence of the groundglass nodules in the left upper lobe, but actual decrease in size.  Accordingly we will see the patient one more time, a year from now, after her next chest CT scan. It that study is stable or shows further improvement, we will release her from followup here. [name] [name] has a good understanding of this plan and is very much in agreement. She knows to call for any other problems that may develop before her next visit here.  I personally saw this patient and performed a substantive portion of this encounter with the listed APP documented above.   Chauncey Cruel, MD

## 2012-09-14 NOTE — Telephone Encounter (Signed)
appts made and printed. Pt is aware that cs will call her w/ her appt for the CT CHEST...td

## 2012-09-14 NOTE — Patient Instructions (Addendum)
Please contact us at [phone] if you have any questions or concerns.  Please continue to do well and enjoy life!!!  Get plenty of rest, drink plenty of water, exercise daily, eat a balanced diet.

## 2012-09-15 ENCOUNTER — Telehealth: Payer: Self-pay | Admitting: Family Medicine

## 2012-09-15 NOTE — Telephone Encounter (Signed)
Dr Pollyann Samples office called wanting to know if she needed to take any antibiotics before procedure since she had a TKA.   Per Dr Elease Hashimoto pt needs to take clindamycin 300 mg 2 capsules 30 min to 1 hour before procedure #2/0 refills.  Pt aware, rx called in to pharmacy.  Asked Dr Arnoldo Morale about the Eliquis and he said that if pt has a deep cleaning that require anesthesia or crowning then pt would need to stop 3 days prior but for a regular cleaning no need to stop.  Cathy at Dr Pollyann Samples office aware

## 2012-09-15 NOTE — Telephone Encounter (Signed)
Patient states she is having a dental procedure today . Replace a cap , clean around the tooth.  She is currently on Eliquis and is concerned.  Literature states "  ELIQUIS may need to be stopped prior to surgery or a medical or dental procedure. Your doctor will tell you when you should stop taking ELIQUIS and when you may start taking it again. If you have to stop taking ELIQUIS, your doctor may prescribe another medicine to help prevent a blood clot from forming."    Appt today is 11:00 a.m.  She is seeking guidance regarding medication and procedure.  Please contact.   [phone]

## 2012-09-17 ENCOUNTER — Telehealth: Payer: Self-pay | Admitting: Family Medicine

## 2012-09-17 NOTE — Telephone Encounter (Signed)
So, pt needs a tooth extracted, her dentist gave her Rx for amoxicillin 500 mg, 4 tabs po one hour prior to procedure with oral surgeon.  Pt states Dr Elease Hashimoto has prescribed clindamycin 300 mg, 2 tabs one hour prior to procedure because she takes Eliquis 5 mg BID.  Pt was told by her pharmacist she should stop the Eliquis 3 days prior to extraction. Pt question:  Is she to take both the amoxicillin and clindamycin?

## 2012-09-17 NOTE — Telephone Encounter (Signed)
Do NOT take both.  The reason we prescribed the Clindamycin is that she had allergy listed to Amoxicillin.

## 2012-09-17 NOTE — Telephone Encounter (Signed)
I spoke with pt and she states she always takes 4 amoxicillin before her dental cleanings, etc.  She will plan to take it only as instructed.  Pt asked amoxicillin be removed from her med list.  FYI

## 2012-09-17 NOTE — Telephone Encounter (Signed)
PT called and stated that she is going to have some dental work done. She states that we called her in a medication, as well as another physician. She states that she has 2 different types of medication from 2 different doctors. She would like assistance in deciphering which to take if not both. Please assist.

## 2012-09-20 ENCOUNTER — Ambulatory Visit (INDEPENDENT_AMBULATORY_CARE_PROVIDER_SITE_OTHER): Payer: Medicare Other | Admitting: Family Medicine

## 2012-09-20 VITALS — BP 120/68 | Temp 98.2°F | Wt 209.0 lb

## 2012-09-20 DIAGNOSIS — J069 Acute upper respiratory infection, unspecified: Secondary | ICD-10-CM | POA: Diagnosis not present

## 2012-09-20 DIAGNOSIS — IMO0002 Reserved for concepts with insufficient information to code with codable children: Secondary | ICD-10-CM | POA: Diagnosis not present

## 2012-09-20 DIAGNOSIS — M792 Neuralgia and neuritis, unspecified: Secondary | ICD-10-CM

## 2012-09-20 MED ORDER — GABAPENTIN 100 MG PO CAPS: 100.0000 mg | ORAL_CAPSULE | Freq: Two times a day (BID) | ORAL | Status: DC

## 2012-09-20 NOTE — Progress Notes (Signed)
  Subjective:    Patient ID: [name] [name], female    DOB: [birth year], 79 y.o.   MRN: [id number]  HPI Acute URI symptoms.  Onset last Thursday Onset of laryngitis.  Those symptoms are slightly better Cough productive of light green sputum.  No fever or chills. Clear nasal mucus.  Husband has similar symptoms She denies any nausea, vomiting, or diarrhea  Second issue is patient has persistent burning-like pain thoracic dermatome right side Previous notes reviewed. She was prescribed gabapentin 300 mg but had extreme dizziness with higher dosage. She's never had rash. Pain at its worst 8-9/10 severity. Not relieved with Tylenol.  Past Medical History  Diagnosis Date  . HYPOTHYROIDISM 07/24/2008  . HYPERLIPIDEMIA 07/24/2008  . DEPRESSION 05/16/2009  . HYPERTENSION 07/24/2008  . Vertigo   . Diabetes mellitus type II   . Arthritis     r tkr  . CVA (cerebral vascular accident) 2008    mini stroke.no residual  . Heart murmur     stress test 2009.dr peter Martinique  . Intermittent vertigo   . Skin abnormality     facial lesions .pt applying mupiracin to areas  . Atrial fibrillation   . Breast cancer    Past Surgical History  Procedure Laterality Date  . Knee surgery  2009    TKR  . Cholecystectomy  1980  . Joint replacement      r knee  . Breast surgery  2007    Lumpectomy, XRT 2006.l breast  . Video assisted thoracoscopy  04/22/2011    Procedure: VIDEO ASSISTED THORACOSCOPY;  Surgeon: Melrose Nakayama, MD;  Location: [facility];  Service: Thoracic;  Laterality: Left;  WITH BIOPSY    reports that she quit smoking about 30 years ago. Her smoking use included Cigarettes. She has a 6 pack-year smoking history. She has never used smokeless tobacco. She reports that she does not drink alcohol or use illicit drugs. family history includes Arthritis in her mother; Breast cancer in her sister; Cancer in her sister; Coronary artery disease in her brother and father; Heart disease in her father;  Lung cancer in her brother; and Rheum arthritis in her mother. Allergies  Allergen Reactions  . Codeine Sulfate     REACTION: GI upset  . Sulfonamide Derivatives     REACTION: GI upset  . Atarax (Hydroxyzine) Nausea Only and Rash      Review of Systems  Constitutional: Positive for fatigue. Negative for fever and chills.  HENT: Positive for congestion. Negative for sore throat.   Respiratory: Positive for cough. Negative for shortness of breath and wheezing.   Musculoskeletal: Positive for back pain.  Skin: Negative for rash.       Objective:   Physical Exam  Constitutional: She is oriented to person, place, and time. She appears well-developed and well-nourished.  Cardiovascular: Normal rate and regular rhythm.   Pulmonary/Chest: Effort normal and breath sounds normal. No respiratory distress. She has no wheezes. She has no rales.  Neurological: She is alert and oriented to person, place, and time. No cranial nerve deficit.  Skin: No rash noted.          Assessment & Plan:  #1 viral upper respiratory infection. No clear indication for antibiotics. Reassurance given #2 neuropathic type pain right thoracic area. No rash. Try lower dose gabapentin 100 mg each bedtime and if tolerated after 3-4 days titrate to 100 mg twice a day

## 2012-09-27 ENCOUNTER — Telehealth: Payer: Self-pay | Admitting: Family Medicine

## 2012-09-27 ENCOUNTER — Ambulatory Visit: Payer: Self-pay | Admitting: Family Medicine

## 2012-09-27 MED ORDER — AZITHROMYCIN 250 MG PO TABS: ORAL_TABLET | ORAL | Status: DC

## 2012-09-27 NOTE — Telephone Encounter (Signed)
OK to call in Zithromax (5 day pack) but needs to be seen next week if no better.

## 2012-09-27 NOTE — Telephone Encounter (Signed)
Okay to call in Zithromax 5 day pack

## 2012-09-27 NOTE — Telephone Encounter (Signed)
Patient Information:  Caller Name: Meghann  Phone: [phone]  Patient: [name] [name], [name] [name]  Gender: Female  DOB: 1932-08-16  Age: 79 Years  PCP: [facility])  Office Follow Up:  Does the office need to follow up with this patient?: Yes  Instructions For The Office: Appointment scheduled today with Dr. Elease Hashimoto at 16:00; however patient is requesting an antibiotic. Please contact patient and let her know if she needs to keep appointment or if something will be called in.  RN Note:  Cold continues with nasal drainage clear and productive cough with green sputum.  Afebrile, feels bad.  She makes herself it and is drinking lots of water.  Is voiding; Has had a persistent headache.  Stopped up head and nose makes it tough to breath. Denies SOB, wheezes or cracking in chest.  Triaged with care advice given.  Appointment scheduled for today 09/27/12 at 16:00 with Dr. Elease Hashimoto.  However with patient just seen on 06/16, she is requesting an antibiotic (Z-Pak) to be called in to [facility] on [address].  RN will make request know to front nurse.  Symptoms  Reason For Call & Symptoms: Summer cold continues and is now in her chest.  Has been seen earlier.  Now coughing up thick green mucus.  Reviewed Health History In EMR: Yes  Reviewed Medications In EMR: Yes  Reviewed Allergies In EMR: Yes  Reviewed Surgeries / Procedures: Yes  Date of Onset of Symptoms: 09/06/2012  Treatments Tried: OTC products with nothing really helping  Treatments Tried Worked: No  Guideline(s) Used:  Colds  Disposition Per Guideline:   See Today or Tomorrow in Office  Reason For Disposition Reached:   Nasal discharge present > 10 days  Advice Given:  For a Stuffy Nose - Use Nasal Washes:  Introduction: Saline (salt water) nasal irrigation (nasal wash) is an effective and simple home remedy for treating stuffy nose and sinus congestion. The nose can be irrigated by pouring, spraying, or squirting  salt water into the nose and then letting it run back out.  Humidifier:  If the air in your home is dry, use a cool-mist humidifier  Call Back If:  Difficulty breathing occurs  Fever lasts more than 3 days  Nasal discharge lasts more than 10 days  Cough lasts more than 3 weeks  You become worse  RN Overrode Recommendation:  Patient Requests Prescription  Since patient is recently seen she is requesting an antibiotic to be called in.

## 2012-09-27 NOTE — Telephone Encounter (Signed)
Rx request to pharmacy; pt informed/SLS

## 2012-10-05 DIAGNOSIS — M3130 Wegener's granulomatosis without renal involvement: Secondary | ICD-10-CM

## 2012-10-05 HISTORY — DX: Wegener's granulomatosis without renal involvement: M31.30

## 2012-10-13 ENCOUNTER — Telehealth: Payer: Self-pay | Admitting: Family Medicine

## 2012-10-13 NOTE — Telephone Encounter (Signed)
Pt needs samples of eliquis 5 mg,azor 2-91 mg and bystolic 10 mg. Pt in donut hole.

## 2012-10-13 NOTE — Telephone Encounter (Signed)
Notified patient samples will be in the front to pick up

## 2012-10-14 DIAGNOSIS — Z853 Personal history of malignant neoplasm of breast: Secondary | ICD-10-CM | POA: Diagnosis not present

## 2012-10-14 DIAGNOSIS — Z9189 Other specified personal risk factors, not elsewhere classified: Secondary | ICD-10-CM | POA: Diagnosis not present

## 2012-10-27 ENCOUNTER — Encounter: Payer: Self-pay | Admitting: Family Medicine

## 2012-10-27 ENCOUNTER — Ambulatory Visit (INDEPENDENT_AMBULATORY_CARE_PROVIDER_SITE_OTHER): Payer: Medicare Other | Admitting: Family Medicine

## 2012-10-27 VITALS — BP 120/60 | HR 66 | Temp 97.9°F | Wt 204.0 lb

## 2012-10-27 DIAGNOSIS — E785 Hyperlipidemia, unspecified: Secondary | ICD-10-CM

## 2012-10-27 DIAGNOSIS — E119 Type 2 diabetes mellitus without complications: Secondary | ICD-10-CM | POA: Diagnosis not present

## 2012-10-27 DIAGNOSIS — K429 Umbilical hernia without obstruction or gangrene: Secondary | ICD-10-CM | POA: Diagnosis not present

## 2012-10-27 DIAGNOSIS — I10 Essential (primary) hypertension: Secondary | ICD-10-CM | POA: Diagnosis not present

## 2012-10-27 DIAGNOSIS — J309 Allergic rhinitis, unspecified: Secondary | ICD-10-CM

## 2012-10-27 LAB — BASIC METABOLIC PANEL
BUN: 16 mg/dL (ref 6–23)
GFR: 42.46 mL/min — ABNORMAL LOW (ref >60.00)
Potassium: 4.3 mEq/L (ref 3.5–5.1)

## 2012-10-27 LAB — LIPID PANEL
Cholesterol: 140 mg/dL (ref 0–200)
HDL: 39.5 mg/dL (ref >39.00)
LDL Cholesterol: 77 mg/dL (ref 0–99)
Triglycerides: 117 mg/dL (ref 0.0–149.0)
VLDL: 23.4 mg/dL (ref 0.0–40.0)

## 2012-10-27 LAB — HEPATIC FUNCTION PANEL
ALT: 14 U/L (ref 0–35)
Albumin: 3.7 g/dL (ref 3.5–5.2)
Bilirubin, Direct: 0 mg/dL (ref 0.0–0.3)
Total Protein: 7.9 g/dL (ref 6.0–8.3)

## 2012-10-27 LAB — HM DIABETES FOOT EXAM: HM Diabetic Foot Exam: NORMAL

## 2012-10-27 MED ORDER — FLUTICASONE PROPIONATE 50 MCG/ACT NA SUSP: 2.0000 | Freq: Every day | NASAL | Status: DC

## 2012-10-27 NOTE — Patient Instructions (Addendum)
Hernia A hernia occurs when an internal organ pushes out through a weak spot in the abdominal wall. Hernias most commonly occur in the groin and around the navel. Hernias often can be pushed back into place (reduced). Most hernias tend to get worse over time. Some abdominal hernias can get stuck in the opening (irreducible or incarcerated hernia) and cannot be reduced. An irreducible abdominal hernia which is tightly squeezed into the opening is at risk for impaired blood supply (strangulated hernia). A strangulated hernia is a medical emergency. Because of the risk for an irreducible or strangulated hernia, surgery may be recommended to repair a hernia. CAUSES   Heavy lifting.  Prolonged coughing.  Straining to have a bowel movement.  A cut (incision) made during an abdominal surgery. HOME CARE INSTRUCTIONS   Bed rest is not required. You may continue your normal activities.  Avoid lifting more than 10 pounds (4.5 kg) or straining.  Cough gently. If you are a smoker it is best to stop. Even the best hernia repair can break down with the continual strain of coughing. Even if you do not have your hernia repaired, a cough will continue to aggravate the problem.  Do not wear anything tight over your hernia. Do not try to keep it in with an outside bandage or truss. These can damage abdominal contents if they are trapped within the hernia sac.  Eat a normal diet.  Avoid constipation. Straining over long periods of time will increase hernia size and encourage breakdown of repairs. If you cannot do this with diet alone, stool softeners may be used. SEEK IMMEDIATE MEDICAL CARE IF:   You have a fever.  You develop increasing abdominal pain.  You feel nauseous or vomit.  Your hernia is stuck outside the abdomen, looks discolored, feels hard, or is tender.  You have any changes in your bowel habits or in the hernia that are unusual for you.  You have increased pain or swelling around the  hernia.  You cannot push the hernia back in place by applying gentle pressure while lying down. MAKE SURE YOU:   Understand these instructions.  Will watch your condition.  Will get help right away if you are not doing well or get worse. Document Released: 03/24/2005 Document Revised: 06/16/2011 Document Reviewed: 11/11/2007 [facility] Patient Information 2014 [place].

## 2012-10-27 NOTE — Progress Notes (Signed)
Subjective:    Patient ID: [name] [name], female    DOB: 05-Nov-1932, 79 y.o.   MRN: [id number]  HPI Followup multiple medical problems  Recently saw a gynecologist. Noted umbilical hernia. Patient has no pain. She's had previous abdominal surgeries. No recent increase in size.  Type 2 diabetes. Last A1c 7.0%. No visual changes. No polydipsia or polyphagia. Patient takes Glucotrol. No hypoglycemia Needs followup lipids. She takes Crestor but somewhat inconsistently. No history of CAD  Hypertension which has actually shown some improved control in recent months. Compliant with medications. History of atrial fibrillation on oral anticoagulant. No recent bleeding complications.  Other new symptom of three-week history postnasal drip. Mostly clear drainage. Occasional sneezing. No facial pain. No colored nasal discharge. No significant headaches. Denies cough  Past Medical History  Diagnosis Date  . HYPOTHYROIDISM 07/24/2008  . HYPERLIPIDEMIA 07/24/2008  . DEPRESSION 05/16/2009  . HYPERTENSION 07/24/2008  . Vertigo   . Diabetes mellitus type II   . Arthritis     r tkr  . CVA (cerebral vascular accident) 2008    mini stroke.no residual  . Heart murmur     stress test 2009.dr peter [place]  . Intermittent vertigo   . Skin abnormality     facial lesions .pt applying mupiracin to areas  . Atrial fibrillation   . Breast cancer    Past Surgical History  Procedure Laterality Date  . Knee surgery  2009    TKR  . Cholecystectomy  1980  . Joint replacement      r knee  . Breast surgery  2007    Lumpectomy, XRT 2006.l breast  . Video assisted thoracoscopy  04/22/2011    Procedure: VIDEO ASSISTED THORACOSCOPY;  Surgeon: Melrose Nakayama, MD;  Location: [place];  Service: Thoracic;  Laterality: Left;  WITH BIOPSY    reports that she quit smoking about 30 years ago. Her smoking use included Cigarettes. She has a 6 pack-year smoking history. She has never used smokeless tobacco. She  reports that she does not drink alcohol or use illicit drugs. family history includes Arthritis in her mother; Breast cancer in her sister; Cancer in her sister; Coronary artery disease in her brother and father; Heart disease in her father; Lung cancer in her brother; and Rheum arthritis in her mother. Allergies  Allergen Reactions  . Codeine Sulfate     REACTION: GI upset  . Sulfonamide Derivatives     REACTION: GI upset  . Atarax (Hydroxyzine) Nausea Only and Rash      Review of Systems  Constitutional: Positive for fatigue. Negative for fever and chills.  HENT: Positive for congestion and postnasal drip. Negative for sore throat and voice change.   Respiratory: Negative for cough and shortness of breath.   Cardiovascular: Negative for chest pain, palpitations and leg swelling.  Endocrine: Negative for polydipsia, polyphagia and polyuria.  Neurological: Negative for dizziness.       Objective:   Physical Exam  Constitutional: She is oriented to person, place, and time. She appears well-developed and well-nourished.  Neck: Neck supple. No thyromegaly present.  Cardiovascular: Normal rate and regular rhythm.   Pulmonary/Chest: Effort normal and breath sounds normal. No respiratory distress. She has no wheezes. She has no rales.  Abdominal:  Patient has soft. Umbilical hernia. Nontender. No other masses noted  Musculoskeletal: She exhibits no edema.  Lymphadenopathy:    She has no cervical adenopathy.  Neurological: She is alert and oriented to person, place, and time.  Assessment & Plan:  #1 . Umbilical hernia. Asymptomatic. Reassurance. She's not interested in surgery this time. #2 hypertension. Well controlled #3 type 2 diabetes. Recheck hemoglobin A1c. Continue regular eye exams. Foot exam normal. Patient takes angiotensin receptor blocker-no urine microalbumin indicated. #4 hyperlipidemia. Check lipid and hepatic panel #5 postnasal drip symptoms. Suspect  allergic. Flonase nasal 2 sprays per nostril once daily

## 2012-11-01 ENCOUNTER — Inpatient Hospital Stay (HOSPITAL_COMMUNITY)
Admission: EM | Admit: 2012-11-01 | Discharge: 2012-11-08 | DRG: 542 | Disposition: A | Discharge status: Home / Self Care | Payer: Medicare Other | Attending: Internal Medicine | Admitting: Internal Medicine

## 2012-11-01 ENCOUNTER — Emergency Department (HOSPITAL_COMMUNITY): Payer: Medicare Other

## 2012-11-01 ENCOUNTER — Encounter (HOSPITAL_COMMUNITY): Payer: Self-pay

## 2012-11-01 DIAGNOSIS — Z87891 Personal history of nicotine dependence: Secondary | ICD-10-CM

## 2012-11-01 DIAGNOSIS — Q248 Other specified congenital malformations of heart: Secondary | ICD-10-CM

## 2012-11-01 DIAGNOSIS — R6889 Other general symptoms and signs: Secondary | ICD-10-CM | POA: Diagnosis not present

## 2012-11-01 DIAGNOSIS — R0902 Hypoxemia: Secondary | ICD-10-CM | POA: Diagnosis not present

## 2012-11-01 DIAGNOSIS — Z8673 Personal history of transient ischemic attack (TIA), and cerebral infarction without residual deficits: Secondary | ICD-10-CM | POA: Diagnosis not present

## 2012-11-01 DIAGNOSIS — Z853 Personal history of malignant neoplasm of breast: Secondary | ICD-10-CM

## 2012-11-01 DIAGNOSIS — G8929 Other chronic pain: Secondary | ICD-10-CM | POA: Diagnosis present

## 2012-11-01 DIAGNOSIS — E860 Dehydration: Secondary | ICD-10-CM | POA: Diagnosis not present

## 2012-11-01 DIAGNOSIS — R3129 Other microscopic hematuria: Secondary | ICD-10-CM | POA: Diagnosis not present

## 2012-11-01 DIAGNOSIS — M1990 Unspecified osteoarthritis, unspecified site: Secondary | ICD-10-CM

## 2012-11-01 DIAGNOSIS — K5900 Constipation, unspecified: Secondary | ICD-10-CM | POA: Diagnosis not present

## 2012-11-01 DIAGNOSIS — I4891 Unspecified atrial fibrillation: Secondary | ICD-10-CM | POA: Diagnosis not present

## 2012-11-01 DIAGNOSIS — C7A029 Malignant carcinoid tumor of the large intestine, unspecified portion: Secondary | ICD-10-CM

## 2012-11-01 DIAGNOSIS — I2789 Other specified pulmonary heart diseases: Secondary | ICD-10-CM | POA: Diagnosis present

## 2012-11-01 DIAGNOSIS — R1032 Left lower quadrant pain: Secondary | ICD-10-CM

## 2012-11-01 DIAGNOSIS — J988 Other specified respiratory disorders: Secondary | ICD-10-CM | POA: Diagnosis not present

## 2012-11-01 DIAGNOSIS — E875 Hyperkalemia: Secondary | ICD-10-CM | POA: Diagnosis not present

## 2012-11-01 DIAGNOSIS — Z923 Personal history of irradiation: Secondary | ICD-10-CM

## 2012-11-01 DIAGNOSIS — R0489 Hemorrhage from other sites in respiratory passages: Secondary | ICD-10-CM

## 2012-11-01 DIAGNOSIS — Z8701 Personal history of pneumonia (recurrent): Secondary | ICD-10-CM

## 2012-11-01 DIAGNOSIS — D62 Acute posthemorrhagic anemia: Secondary | ICD-10-CM | POA: Diagnosis not present

## 2012-11-01 DIAGNOSIS — I10 Essential (primary) hypertension: Secondary | ICD-10-CM | POA: Diagnosis present

## 2012-11-01 DIAGNOSIS — N170 Acute kidney failure with tubular necrosis: Secondary | ICD-10-CM | POA: Diagnosis present

## 2012-11-01 DIAGNOSIS — D649 Anemia, unspecified: Secondary | ICD-10-CM | POA: Diagnosis not present

## 2012-11-01 DIAGNOSIS — R109 Unspecified abdominal pain: Secondary | ICD-10-CM

## 2012-11-01 DIAGNOSIS — C7800 Secondary malignant neoplasm of unspecified lung: Secondary | ICD-10-CM | POA: Diagnosis not present

## 2012-11-01 DIAGNOSIS — E871 Hypo-osmolality and hyponatremia: Secondary | ICD-10-CM | POA: Diagnosis present

## 2012-11-01 DIAGNOSIS — N179 Acute kidney failure, unspecified: Secondary | ICD-10-CM | POA: Diagnosis not present

## 2012-11-01 DIAGNOSIS — J811 Chronic pulmonary edema: Secondary | ICD-10-CM | POA: Diagnosis not present

## 2012-11-01 DIAGNOSIS — T380X5A Adverse effect of glucocorticoids and synthetic analogues, initial encounter: Secondary | ICD-10-CM | POA: Diagnosis present

## 2012-11-01 DIAGNOSIS — I319 Disease of pericardium, unspecified: Secondary | ICD-10-CM | POA: Diagnosis present

## 2012-11-01 DIAGNOSIS — E039 Hypothyroidism, unspecified: Secondary | ICD-10-CM | POA: Diagnosis present

## 2012-11-01 DIAGNOSIS — R042 Hemoptysis: Secondary | ICD-10-CM | POA: Diagnosis not present

## 2012-11-01 DIAGNOSIS — R931 Abnormal findings on diagnostic imaging of heart and coronary circulation: Secondary | ICD-10-CM

## 2012-11-01 DIAGNOSIS — L299 Pruritus, unspecified: Secondary | ICD-10-CM

## 2012-11-01 DIAGNOSIS — J984 Other disorders of lung: Secondary | ICD-10-CM | POA: Diagnosis not present

## 2012-11-01 DIAGNOSIS — R42 Dizziness and giddiness: Secondary | ICD-10-CM

## 2012-11-01 DIAGNOSIS — Z79899 Other long term (current) drug therapy: Secondary | ICD-10-CM

## 2012-11-01 DIAGNOSIS — J84112 Idiopathic pulmonary fibrosis: Secondary | ICD-10-CM | POA: Diagnosis present

## 2012-11-01 DIAGNOSIS — E119 Type 2 diabetes mellitus without complications: Secondary | ICD-10-CM | POA: Diagnosis present

## 2012-11-01 DIAGNOSIS — F329 Major depressive disorder, single episode, unspecified: Secondary | ICD-10-CM | POA: Diagnosis present

## 2012-11-01 DIAGNOSIS — E785 Hyperlipidemia, unspecified: Secondary | ICD-10-CM | POA: Diagnosis present

## 2012-11-01 DIAGNOSIS — R112 Nausea with vomiting, unspecified: Secondary | ICD-10-CM | POA: Diagnosis not present

## 2012-11-01 DIAGNOSIS — M3130 Wegener's granulomatosis without renal involvement: Secondary | ICD-10-CM | POA: Diagnosis not present

## 2012-11-01 DIAGNOSIS — Z96659 Presence of unspecified artificial knee joint: Secondary | ICD-10-CM

## 2012-11-01 DIAGNOSIS — R609 Edema, unspecified: Secondary | ICD-10-CM

## 2012-11-01 DIAGNOSIS — I369 Nonrheumatic tricuspid valve disorder, unspecified: Secondary | ICD-10-CM | POA: Diagnosis not present

## 2012-11-01 DIAGNOSIS — J189 Pneumonia, unspecified organism: Secondary | ICD-10-CM | POA: Diagnosis not present

## 2012-11-01 DIAGNOSIS — R0602 Shortness of breath: Secondary | ICD-10-CM | POA: Diagnosis not present

## 2012-11-01 DIAGNOSIS — R9389 Abnormal findings on diagnostic imaging of other specified body structures: Secondary | ICD-10-CM | POA: Diagnosis not present

## 2012-11-01 DIAGNOSIS — C50919 Malignant neoplasm of unspecified site of unspecified female breast: Secondary | ICD-10-CM | POA: Diagnosis not present

## 2012-11-01 DIAGNOSIS — M353 Polymyalgia rheumatica: Secondary | ICD-10-CM

## 2012-11-01 DIAGNOSIS — F3289 Other specified depressive episodes: Secondary | ICD-10-CM | POA: Diagnosis present

## 2012-11-01 LAB — COMPREHENSIVE METABOLIC PANEL
AST: 21 U/L (ref 0–37)
Alkaline Phosphatase: 72 U/L (ref 39–117)
BUN: 19 mg/dL (ref 6–23)
CO2: 24 mEq/L (ref 19–32)
Chloride: 92 mEq/L — ABNORMAL LOW (ref 96–112)
Creatinine, Ser: 1.46 mg/dL — ABNORMAL HIGH (ref 0.50–1.10)
GFR calc non Af Amer: 33 mL/min — ABNORMAL LOW (ref >90)
Potassium: 4.2 mEq/L (ref 3.5–5.1)
Total Bilirubin: 1 mg/dL (ref 0.3–1.2)

## 2012-11-01 LAB — CBC WITH DIFFERENTIAL/PLATELET
HCT: 23.8 % — ABNORMAL LOW (ref 36.0–46.0)
Hemoglobin: 7.9 g/dL — ABNORMAL LOW (ref 12.0–15.0)
Lymphocytes Relative: 14 % (ref 12–46)
Lymphs Abs: 1.4 10*3/uL (ref 0.7–4.0)
Monocytes Absolute: 1.4 10*3/uL — ABNORMAL HIGH (ref 0.1–1.0)
Monocytes Relative: 14 % — ABNORMAL HIGH (ref 3–12)
Neutro Abs: 6.9 10*3/uL (ref 1.7–7.7)
Neutrophils Relative %: 70 % (ref 43–77)
RBC: 2.56 MIL/uL — ABNORMAL LOW (ref 3.87–5.11)
WBC: 9.9 10*3/uL (ref 4.0–10.5)

## 2012-11-01 LAB — CK: Total CK: 90 U/L (ref 7–177)

## 2012-11-01 LAB — GLUCOSE, CAPILLARY
Glucose-Capillary: 186 mg/dL — ABNORMAL HIGH (ref 70–99)
Glucose-Capillary: 145 mg/dL — ABNORMAL HIGH (ref 70–99)

## 2012-11-01 LAB — ABO/RH: ABO/RH(D): A POS

## 2012-11-01 LAB — PROTIME-INR: Prothrombin Time: 21.7 seconds — ABNORMAL HIGH (ref 11.6–15.2)

## 2012-11-01 LAB — LACTIC ACID, PLASMA: Lactic Acid, Venous: 1.7 mmol/L (ref 0.5–2.2)

## 2012-11-01 LAB — TROPONIN I: Troponin I: <0.3 ng/mL (ref <0.30)

## 2012-11-01 LAB — SEDIMENTATION RATE: Sed Rate: 90 mm/hr — ABNORMAL HIGH (ref 0–22)

## 2012-11-01 MED ORDER — DEXTROSE 5 % IV SOLN
500.0000 mg | Freq: Once | INTRAVENOUS | Status: AC
  Administered 2012-11-01: 500 mg via INTRAVENOUS
  Filled 2012-11-01: qty 500

## 2012-11-01 MED ORDER — ALBUTEROL SULFATE (5 MG/ML) 0.5% IN NEBU: 2.5000 mg | INHALATION_SOLUTION | RESPIRATORY_TRACT | Status: DC | PRN

## 2012-11-01 MED ORDER — INSULIN ASPART 100 UNIT/ML ~~LOC~~ SOLN
0.0000 [IU] | Freq: Three times a day (TID) | SUBCUTANEOUS | Status: DC
  Administered 2012-11-01: 2 [IU] via SUBCUTANEOUS
  Administered 2012-11-01: 1 [IU] via SUBCUTANEOUS
  Administered 2012-11-02: 2 [IU] via SUBCUTANEOUS
  Administered 2012-11-02: 1 [IU] via SUBCUTANEOUS
  Administered 2012-11-02: 3 [IU] via SUBCUTANEOUS
  Administered 2012-11-03: 5 [IU] via SUBCUTANEOUS
  Administered 2012-11-03: 9 [IU] via SUBCUTANEOUS
  Administered 2012-11-03: 5 [IU] via SUBCUTANEOUS
  Administered 2012-11-04 (×2): 9 [IU] via SUBCUTANEOUS
  Administered 2012-11-04: 7 [IU] via SUBCUTANEOUS
  Administered 2012-11-05: 8 [IU] via SUBCUTANEOUS

## 2012-11-01 MED ORDER — LORATADINE 10 MG PO TABS
10.0000 mg | ORAL_TABLET | Freq: Every day | ORAL | Status: DC
  Administered 2012-11-02: 10 mg via ORAL
  Filled 2012-11-01 (×3): qty 1

## 2012-11-01 MED ORDER — DEXTROSE 5 % IV SOLN
1.0000 g | INTRAVENOUS | Status: DC
  Administered 2012-11-01 – 2012-11-02 (×2): 1 g via INTRAVENOUS
  Filled 2012-11-01 (×3): qty 10

## 2012-11-01 MED ORDER — ONDANSETRON HCL 4 MG/2ML IJ SOLN
4.0000 mg | Freq: Four times a day (QID) | INTRAMUSCULAR | Status: DC | PRN
  Administered 2012-11-02: 4 mg via INTRAVENOUS
  Filled 2012-11-01: qty 2

## 2012-11-01 MED ORDER — SODIUM CHLORIDE 0.9 % IJ SOLN
3.0000 mL | Freq: Two times a day (BID) | INTRAMUSCULAR | Status: DC
  Administered 2012-11-03 – 2012-11-05 (×5): 3 mL via INTRAVENOUS
  Administered 2012-11-07: 10:00:00 via INTRAVENOUS
  Administered 2012-11-07: 3 mL via INTRAVENOUS

## 2012-11-01 MED ORDER — DEXTROSE 5 % IV SOLN
500.0000 mg | INTRAVENOUS | Status: DC
  Administered 2012-11-02: 500 mg via INTRAVENOUS
  Filled 2012-11-01 (×2): qty 500

## 2012-11-01 MED ORDER — FLUTICASONE PROPIONATE 50 MCG/ACT NA SUSP
2.0000 | Freq: Every day | NASAL | Status: DC
  Administered 2012-11-01 – 2012-11-08 (×6): 2 via NASAL
  Filled 2012-11-01: qty 16

## 2012-11-01 MED ORDER — DEXTROSE 5 % IV SOLN: 1.0000 g | Freq: Once | INTRAVENOUS | Status: DC

## 2012-11-01 MED ORDER — SODIUM CHLORIDE 0.9 % IV SOLN
INTRAVENOUS | Status: DC
  Administered 2012-11-01 – 2012-11-02 (×3): via INTRAVENOUS

## 2012-11-01 MED ORDER — IOHEXOL 350 MG/ML SOLN
100.0000 mL | Freq: Once | INTRAVENOUS | Status: AC | PRN
  Administered 2012-11-01: 100 mL via INTRAVENOUS

## 2012-11-01 MED ORDER — ACETAMINOPHEN 650 MG RE SUPP: 650.0000 mg | Freq: Four times a day (QID) | RECTAL | Status: DC | PRN

## 2012-11-01 MED ORDER — ACETAMINOPHEN 325 MG PO TABS
650.0000 mg | ORAL_TABLET | Freq: Four times a day (QID) | ORAL | Status: DC | PRN
  Administered 2012-11-03: 650 mg via ORAL
  Filled 2012-11-01 (×2): qty 2

## 2012-11-01 MED ORDER — ATORVASTATIN CALCIUM 10 MG PO TABS
10.0000 mg | ORAL_TABLET | Freq: Every day | ORAL | Status: DC
  Administered 2012-11-01 – 2012-11-07 (×7): 10 mg via ORAL
  Filled 2012-11-01 (×8): qty 1

## 2012-11-01 MED ORDER — INSULIN ASPART 100 UNIT/ML ~~LOC~~ SOLN
0.0000 [IU] | Freq: Every day | SUBCUTANEOUS | Status: DC
  Administered 2012-11-02: 2 [IU] via SUBCUTANEOUS
  Administered 2012-11-03: 4 [IU] via SUBCUTANEOUS
  Administered 2012-11-04: 23:00:00 via SUBCUTANEOUS

## 2012-11-01 MED ORDER — MOMETASONE FURO-FORMOTEROL FUM 100-5 MCG/ACT IN AERO
2.0000 | INHALATION_SPRAY | Freq: Two times a day (BID) | RESPIRATORY_TRACT | Status: DC
  Administered 2012-11-01 – 2012-11-08 (×12): 2 via RESPIRATORY_TRACT
  Filled 2012-11-01: qty 8.8

## 2012-11-01 MED ORDER — GABAPENTIN 100 MG PO CAPS
100.0000 mg | ORAL_CAPSULE | Freq: Two times a day (BID) | ORAL | Status: DC
  Administered 2012-11-01 – 2012-11-08 (×14): 100 mg via ORAL
  Filled 2012-11-01 (×17): qty 1

## 2012-11-01 MED ORDER — GUAIFENESIN-DM 100-10 MG/5ML PO SYRP: 5.0000 mL | ORAL_SOLUTION | ORAL | Status: DC | PRN

## 2012-11-01 MED ORDER — AMLODIPINE BESYLATE 5 MG PO TABS
5.0000 mg | ORAL_TABLET | Freq: Every day | ORAL | Status: DC
  Administered 2012-11-01 – 2012-11-04 (×4): 5 mg via ORAL
  Filled 2012-11-01 (×4): qty 1

## 2012-11-01 MED ORDER — ONDANSETRON HCL 4 MG PO TABS
4.0000 mg | ORAL_TABLET | Freq: Four times a day (QID) | ORAL | Status: DC | PRN
  Administered 2012-11-01: 4 mg via ORAL
  Filled 2012-11-01: qty 1

## 2012-11-01 MED ORDER — NEBIVOLOL HCL 10 MG PO TABS
10.0000 mg | ORAL_TABLET | Freq: Every day | ORAL | Status: DC
  Administered 2012-11-01 – 2012-11-08 (×8): 10 mg via ORAL
  Filled 2012-11-01 (×8): qty 1

## 2012-11-01 MED ORDER — LEVOTHYROXINE SODIUM 125 MCG PO TABS
125.0000 ug | ORAL_TABLET | Freq: Every day | ORAL | Status: DC
  Administered 2012-11-02 – 2012-11-08 (×7): 125 ug via ORAL
  Filled 2012-11-01 (×9): qty 1

## 2012-11-01 NOTE — Progress Notes (Signed)
Patient declines activation of MyChart-Does not have a computer

## 2012-11-01 NOTE — ED Notes (Signed)
UYE:[id number]<HW> Expected date:<BR> Expected time:<BR> Means of arrival:<BR> Comments:<BR> Coughing of blood

## 2012-11-01 NOTE — ED Provider Notes (Signed)
CSN: [id number]     Arrival date & time 11/01/12  0735 History     First MD Initiated Contact with Patient 11/01/12 0735     Chief Complaint  Patient presents with  . Hemoptysis   (Consider location/radiation/quality/duration/timing/severity/associated sxs/prior Treatment) HPI Comments: Patient presents with a 2 week history of hemoptysis.  She has been coughing and on several occasions has produced sputum with "chunks of blood" she reports are the size of her fingertip.  She was seen by her pcp twice for the same and told it was likely her sinuses.  She was given nasal steroids but is not getting better.  She felt nauseated last night and has vomited.  There was no blood in the vomit.  No abd pain, no leg swelling or calf pain.  The history is provided by the patient.    Past Medical History  Diagnosis Date  . HYPOTHYROIDISM 07/24/2008  . HYPERLIPIDEMIA 07/24/2008  . DEPRESSION 05/16/2009  . HYPERTENSION 07/24/2008  . Vertigo   . Diabetes mellitus type II   . Arthritis     r tkr  . CVA (cerebral vascular accident) 2008    mini stroke.no residual  . Heart murmur     stress test 2009.dr peter [place]  . Intermittent vertigo   . Skin abnormality     facial lesions .pt applying mupiracin to areas  . Atrial fibrillation   . Breast cancer    Past Surgical History  Procedure Laterality Date  . Knee surgery  2009    TKR  . Cholecystectomy  1980  . Joint replacement      r knee  . Breast surgery  2007    Lumpectomy, XRT 2006.l breast  . Video assisted thoracoscopy  04/22/2011    Procedure: VIDEO ASSISTED THORACOSCOPY;  Surgeon: Melrose Nakayama, MD;  Location: [facility] OR;  Service: Thoracic;  Laterality: Left;  WITH BIOPSY   Family History  Problem Relation Age of Onset  . Arthritis Mother   . Rheum arthritis Mother   . Heart disease Father   . Coronary artery disease Father   . Cancer Sister     breast CA, both sisters  . Breast cancer Sister   . Coronary artery disease  Brother   . Lung cancer Brother    History  Substance Use Topics  . Smoking status: Former Smoker -- 0.50 packs/day for 12 years    Types: Cigarettes    Quit date: 06/04/1982  . Smokeless tobacco: Never Used  . Alcohol Use: No   OB History   Grav Para Term Preterm Abortions TAB SAB Ect Mult Living                 Review of Systems  All other systems reviewed and are negative.    Allergies  Codeine sulfate; Sulfonamide derivatives; and Atarax  Home Medications   Current Outpatient Rx  Name  Route  Sig  Dispense  Refill  . apixaban (ELIQUIS) 5 MG TABS tablet   Oral   Take 1 tablet (5 mg total) by mouth 2 (two) times daily.   60 tablet   6   . AZOR 5-40 MG per tablet      TAKE ONE TABLET BY MOUTH EVERY DAY   90 tablet   3   . BYSTOLIC 10 MG tablet      TAKE ONE TABLET BY MOUTH EVERY DAY   90 tablet   3   . CALCIUM CITRATE PO   Oral  Take 1,200 mg by mouth daily.           . cholecalciferol (VITAMIN D) 1000 UNITS tablet   Oral   Take 3,000 Units by mouth daily.           . diazepam (VALIUM) 5 MG tablet      TAKE ONE TABLET BY MOUTH EVERY DAY AS NEEDED.   30 tablet   0   . FeFum-FePo-FA-B Cmp-C-Zn-Mn-Cu (SE-TAN PLUS) 162-115.2-1 MG CAPS      TAKE ONE CAPSULE BY MOUTH EVERY DAY   30 capsule   11   . fluticasone (FLONASE) 50 MCG/ACT nasal spray   Nasal   Place 2 sprays into the nose daily.   16 g   6   . Fluticasone-Salmeterol (ADVAIR) 250-50 MCG/DOSE AEPB   Inhalation   Inhale 1 puff into the lungs every 12 (twelve) hours.         . furosemide (LASIX) 20 MG tablet   Oral   Take 1 tablet (20 mg total) by mouth daily.   30 tablet   0   . gabapentin (NEURONTIN) 100 MG capsule   Oral   Take 1 capsule (100 mg total) by mouth 2 (two) times daily before a meal.   60 capsule   3   . glipiZIDE (GLUCOTROL) 10 MG tablet      TAKE ONE TABLET BY MOUTH TWICE DAILY   180 tablet   3   . levothyroxine (SYNTHROID, LEVOTHROID) 125 MCG  tablet      TAKE ONE TABLET BY MOUTH EVERY DAY   90 tablet   3   . loratadine (CLARITIN) 10 MG tablet   Oral   Take 10 mg by mouth daily.         . metFORMIN (GLUCOPHAGE-XR) 500 MG 24 hr tablet      TAKE TWO TABLETS BY MOUTH TWICE DAILY   120 tablet   11   . multivitamin (METANX) 3-35-2 MG TABS tablet      TAKE ONE TABLET BY MOUTH EVERY DAY   90 tablet   3     Dispense as written.   . ondansetron (ZOFRAN) 4 MG tablet   Oral   Take 4 mg by mouth every 8 (eight) hours as needed for nausea.         . Pyridoxine HCl (VITAMIN B-6 PO)   Oral   Take 100 mg by mouth daily.          . rosuvastatin (CRESTOR) 5 MG tablet   Oral   Take 1 tablet (5 mg total) by mouth daily.   30 tablet   11   . triamcinolone cream (KENALOG) 0.1 %   Topical   Apply 1 application topically 2 (two) times daily. Compound 1:1 with Eucerin cream          BP 134/62  Pulse 77  Temp(Src) 98.9 F (37.2 C) (Oral)  Resp 20  SpO2 94% Physical Exam  Nursing note and vitals reviewed. Constitutional: She is oriented to person, place, and time. She appears well-developed and well-nourished. No distress.  HENT:  Head: Normocephalic and atraumatic.  Neck: Normal range of motion. Neck supple.  Cardiovascular: Normal rate and regular rhythm.  Exam reveals no gallop and no friction rub.   There is a 3/6 SEM heard best at the LLSB.    Pulmonary/Chest: Effort normal and breath sounds normal. No respiratory distress. She has no wheezes.  Abdominal: Soft. Bowel sounds are normal. She exhibits no  distension. There is no tenderness.  Musculoskeletal: Normal range of motion. She exhibits no edema.  No calf tenderness or swelling.  Homan sign absent.  Neurological: She is alert and oriented to person, place, and time.  Skin: Skin is warm and dry. She is not diaphoretic.    ED Course   Procedures (including critical care time)  Labs Reviewed  CBC WITH DIFFERENTIAL  COMPREHENSIVE METABOLIC PANEL   TROPONIN I  PROTIME-INR   No results found. No diagnosis found.   Date: 11/01/2012  Rate: 80  Rhythm: atrial fibrillation  QRS Axis: normal  Intervals: normal  ST/T Wave abnormalities: normal  Conduction Disutrbances:none  Narrative Interpretation:   Old EKG Reviewed: unchanged    MDM  The chest xray and ct scan are both suggestive of bilateral pneumonia.  No pe or lung mass identified.  She is hypoxic and will require admission.  I have spoken with Dr. Algis Liming from [facility] who agrees to admit.  Rocephin and zithromax ordered.    Veryl Speak, MD 11/01/12 1019

## 2012-11-01 NOTE — ED Notes (Signed)
Patient transported to X-ray

## 2012-11-01 NOTE — Care Management Note (Signed)
Page 1 of 1   11/01/2012     2:22:40 PM   CARE MANAGEMENT NOTE 11/01/2012  Patient:  [name] [name], [name] [name]   Account Number:  [id number]  Date Initiated:  11/01/2012  Documentation initiated by:  Dessa Phi  Subjective/Objective Assessment:   ADMITTED W/HEMOPTYSIS.ALVEOLAR HEMORRHAGE.UM:PNTIRW CA,MULTIPLE CO-MORBIDITIES.     Action/Plan:   FROM HOMEW/SPOUSE.HAS PCP, PHARMACY.   Anticipated DC Date:  11/04/2012   Anticipated DC Plan:  [place]  CM consult      Choice offered to / List presented to:  C-1 Patient           Status of service:  In process, will continue to follow Medicare Important Message given?   (If response is "NO", the following Medicare IM given date fields will be blank) Date Medicare IM given:   Date Additional Medicare IM given:    Discharge Disposition:    Per UR Regulation:  Reviewed for med. necessity/level of care/duration of stay  If discussed at [place] of Stay Meetings, dates discussed:    Comments:  11/01/12 Letecia Arps RN,BSN NCM [phone] PULM CONS.RECOMMEND PT CONS WHEN MD FEEL APPROPRIATE.

## 2012-11-01 NOTE — ED Notes (Signed)
Hospitalist in to see patient and requested that he examine patient prior to taking her to the floor.

## 2012-11-01 NOTE — H&P (Signed)
[facility] History and Physical  [name] [name] YQI:[id number] DOB: 1933/01/10 DOA: 11/01/2012  Referring physician: Dr. Veryl Speak PCP: [name], MD  Specialists:  1. Pulmonology: Dr. Baird Lyons. 2. Cardiology: Dr. Peter Martinique.  Chief Complaint: Coughing blood and nausea.  HPI: [name] [name] is a 79 y.o. female with history of hypothyroidism, hyperlipidemia, hypertension, hyponatremia, anemia, type II DM, CVA, atrial fibrillation on Eliquis, former smoker, breast cancer in remission, cryptogenic organizing pneumonia, presented to the [facility] ED on 11/01/12 with 3 weeks history of worsening cough with blood. Patient states that 3 weeks ago she started experiencing stuffiness of head and nose with associated cough and intermittent minimal blood in the sputum. She was seen by her PCP and treated for presumed sinusitis with Flonase and medication for nausea. She states that her symptoms did not improve. For the last 4 or 5 days, she is noted worsening cough with associated chills but no fever, denies dyspnea or chest pain. Over the last 2 days she says that she has coughed up at least 10-15 times each day with minimal amounts of blood each time which may be bright red, dark red and small clots. She denies any other colored sputum. She denies any other site of hemorrhage. Poor appetite. In the ED, afebrile, normal WBC, sodium 125, hemoglobin 7.9 (baseline 10 g per DL) & CT chest negative for PE but shows bilateral multilobar airspace consolidation. Hospitalist admission was requested.   Review of Systems: All systems reviewed and apart from history of presenting illness, are negative.    Past Medical History  Diagnosis Date  . HYPOTHYROIDISM 07/24/2008  . HYPERLIPIDEMIA 07/24/2008  . DEPRESSION 05/16/2009  . HYPERTENSION 07/24/2008  . Vertigo   . Diabetes mellitus type II   . Arthritis     r tkr  . CVA (cerebral vascular accident) 2008    mini stroke.no residual   . Heart murmur     stress test 2009.dr peter Martinique  . Intermittent vertigo   . Skin abnormality     facial lesions .pt applying mupiracin to areas  . Atrial fibrillation   . Breast cancer    Past Surgical History  Procedure Laterality Date  . Knee surgery  2009    TKR  . Cholecystectomy  1980  . Joint replacement      r knee  . Breast surgery  2007    Lumpectomy, XRT 2006.l breast  . Video assisted thoracoscopy  04/22/2011    Procedure: VIDEO ASSISTED THORACOSCOPY;  Surgeon: Melrose Nakayama, MD;  Location: [facility];  Service: Thoracic;  Laterality: Left;  WITH BIOPSY   Social History:  reports that she quit smoking about 30 years ago. Her smoking use included Cigarettes. She has a 6 pack-year smoking history. She has never used smokeless tobacco. She reports that she does not drink alcohol or use illicit drugs. Married. Independent of activities of daily living. Does not use home oxygen.  Allergies  Allergen Reactions  . Codeine Sulfate     REACTION: GI upset  . Sulfonamide Derivatives     REACTION: GI upset  . Atarax (Hydroxyzine) Nausea Only and Rash    Family History  Problem Relation Age of Onset  . Arthritis Mother   . Rheum arthritis Mother   . Heart disease Father   . Coronary artery disease Father   . Cancer Sister     breast CA, both sisters  . Breast cancer Sister   . Coronary artery disease Brother   .  Lung cancer Brother      Prior to Admission medications   Medication Sig Start Date End Date Taking? Authorizing Provider  apixaban (ELIQUIS) 5 MG TABS tablet Take 1 tablet (5 mg total) by mouth 2 (two) times daily. 04/15/12   Peter M Martinique, MD  AZOR 5-40 MG per tablet TAKE ONE TABLET BY MOUTH EVERY DAY 03/07/12   [name], MD  BYSTOLIC 10 MG tablet TAKE ONE TABLET BY MOUTH EVERY DAY 02/27/12   [name], MD  CALCIUM CITRATE PO Take 1,200 mg by mouth daily.      Historical Provider, MD  cholecalciferol (VITAMIN D) 1000 UNITS tablet Take  3,000 Units by mouth daily.      Historical Provider, MD  diazepam (VALIUM) 5 MG tablet TAKE ONE TABLET BY MOUTH EVERY DAY AS NEEDED. 07/26/12   [name], MD  FeFum-FePo-FA-B Cmp-C-Zn-Mn-Cu (SE-TAN PLUS) 162-115.2-1 MG CAPS TAKE ONE CAPSULE BY MOUTH EVERY DAY 02/13/12   [name], MD  fluticasone (FLONASE) 50 MCG/ACT nasal spray Place 2 sprays into the nose daily. 10/27/12   [name], MD  Fluticasone-Salmeterol (ADVAIR) 250-50 MCG/DOSE AEPB Inhale 1 puff into the lungs every 12 (twelve) hours.    Historical Provider, MD  furosemide (LASIX) 20 MG tablet Take 1 tablet (20 mg total) by mouth daily. 01/23/12   Lucretia Kern, DO  gabapentin (NEURONTIN) 100 MG capsule Take 1 capsule (100 mg total) by mouth 2 (two) times daily before a meal. 09/20/12   [name], MD  glipiZIDE (GLUCOTROL) 10 MG tablet TAKE ONE TABLET BY MOUTH TWICE DAILY 07/08/12   [name], MD  levothyroxine (SYNTHROID, LEVOTHROID) 125 MCG tablet TAKE ONE TABLET BY MOUTH EVERY DAY 04/12/12   [name], MD  loratadine (CLARITIN) 10 MG tablet Take 10 mg by mouth daily.    Historical Provider, MD  metFORMIN (GLUCOPHAGE-XR) 500 MG 24 hr tablet TAKE TWO TABLETS BY MOUTH TWICE DAILY 07/02/11   [name], MD  multivitamin (METANX) 3-35-2 MG TABS tablet TAKE ONE TABLET BY MOUTH EVERY DAY 08/18/11   [name], MD  ondansetron (ZOFRAN) 4 MG tablet Take 4 mg by mouth every 8 (eight) hours as needed for nausea.    Historical Provider, MD  Pyridoxine HCl (VITAMIN B-6 PO) Take 100 mg by mouth daily.     Historical Provider, MD  rosuvastatin (CRESTOR) 5 MG tablet Take 1 tablet (5 mg total) by mouth daily. 04/20/12   Peter M Martinique, MD  triamcinolone cream (KENALOG) 0.1 % Apply 1 application topically 2 (two) times daily. Compound 1:1 with Eucerin cream    Historical Provider, MD   Physical Exam: Filed Vitals:   11/01/12 0800 11/01/12 0824 11/01/12 0900 11/01/12 1001  BP: 130/72 143/69 138/70  146/62  Pulse: 47 78 77 80  Temp:      TempSrc:      Resp:  _0 SpO2: 93% 94% 95% 95%     General exam: Moderately built and nourished female patient, lying comfortably supine on the gurney in no obvious distress.  Head, eyes and ENT: Nontraumatic and normocephalic. Pupils equally reacting to light and accommodation. Oral mucosa dry and no blood visualized..  Neck: Supple. No JVD, carotid bruit or thyromegaly.  Lymphatics: No lymphadenopathy.  Respiratory system: Reduced breath sounds bilaterally with bilateral coarse crackles left greater than right but no wheezing or rhonchi. No increased work of breathing.  Cardiovascular system: S1 and S2 heard,  irregularly irregular. No JVD, murmurs, gallops, clicks or pedal edema.  Gastrointestinal system: Abdomen is nondistended, soft and nontender. Normal bowel sounds heard. No organomegaly or masses appreciated.  Central nervous system: Alert and oriented. No focal neurological deficits.  Extremities: Symmetric 5 x 5 power. Peripheral pulses symmetrically felt.  Skin: No rashes or acute findings.  Musculoskeletal system: Negative exam.  Psychiatry: Pleasant and cooperative.   Labs on Admission:  Basic Metabolic Panel:  Recent Labs Lab 10/27/12 1007 11/01/12 0825  NA 131* 125*  K 4.3 4.2  CL 98 92*  CO2 26 24  GLUCOSE 115* 180*  BUN 16 19  CREATININE 1.3* 1.46*  CALCIUM 9.0 8.7   Liver Function Tests:  Recent Labs Lab 10/27/12 1007 11/01/12 0825  AST 21 21  ALT 14 11  ALKPHOS 53 72  BILITOT 0.9 1.0  PROT 7.9 7.6  ALBUMIN 3.7 3.0*   No results found for this basename: LIPASE, AMYLASE,  in the last 168 hours No results found for this basename: AMMONIA,  in the last 168 hours CBC:  Recent Labs Lab 11/01/12 0825  WBC 9.9  NEUTROABS 6.9  HGB 7.9*  HCT 23.8*  MCV 93.0  PLT 268   Cardiac Enzymes:  Recent Labs Lab 11/01/12 0825  TROPONINI <0.30    BNP (last 3 results) No results found for  this basename: PROBNP,  in the last 8760 hours CBG: No results found for this basename: GLUCAP,  in the last 168 hours  Radiological Exams on Admission: Dg Chest 2 View  11/01/2012   *RADIOLOGY REPORT*  Clinical Data: Hemoptysis, cough, wheezing, hypertension, breast cancer  CHEST - 2 VIEW  Comparison: 06/20/2011 and 08/02/2012  Findings: Cardiomegaly again noted.  Atherosclerotic calcifications of thoracic aorta.  Old left upper rib fractures are again noted. There is patchy perihilar airspace disease bilaterally more confluent in the right midlung.  Bilateral multifocal pneumonia cannot be excluded.  Less likely multifocal interstitial tumor spread.  Clinical correlation is necessary.  Further evaluation with CT scan of the chest is recommended.  IMPRESSION: There is patchy perihilar airspace disease bilaterally more confluent in the right midlung.  Bilateral multifocal pneumonia cannot be excluded.  Less likely multifocal interstitial tumor spread.  Clinical correlation is necessary.  Further evaluation with CT scan of the chest is recommended.   Original Report Authenticated By: Lahoma Crocker, M.D.   Ct Angio Chest Pe W/cm &/or Wo Cm  11/01/2012   *RADIOLOGY REPORT*  Clinical Data: Hemoptysis, shortness of breath, history of breast cancer diagnosed 2007 status post left lumpectomy and radiation therapy  CT ANGIOGRAPHY CHEST  Technique:  Multidetector CT imaging of the chest using the standard protocol during bolus administration of intravenous contrast. Multiplanar reconstructed images including MIPs were obtained and reviewed to evaluate the vascular anatomy.  Contrast: 18m OMNIPAQUE IOHEXOL 350 MG/ML SOLN  Comparison: Chest radiograph 11/01/2012, CT 08/02/2012  Findings: The study is of adequate technical quality for evaluation for pulmonary embolism up to and including the 3rd order pulmonary arteries. No focal filling defect is seen to suggest acute pulmonary embolism.  Left breast lumpectomy changes  are noted.  Heart size is moderately enlarged.  Moderate low density pericardial effusion is present. No pleural effusion identified.  Moderate atheromatous aortic calcification without aneurysm.  Small pretracheal nodes are re- identified but slightly increased since previously; representative 0.9 cm pretracheal node identified image 25, previously 0.4 cm at the same anatomic level.  There are multiple new patchy areas of ground-glass opacity alveolar  consolidation with air bronchogram formation.  The distribution is predominately central but there are areas that are relatively more peripheral, likely reflecting contiguous spread. Central airways are patent.  Some areas of pulmonary consolidation have a relatively more nodular appearance, for example at the lung bases measuring 1 cm left lower lobe image 63 and 0.7 cm right lower lobe image 63, respectively.  However, no discrete solid appearing pulmonary nodularity is otherwise identifiable.  Remote deformity of the left lateral fifth and sixth ribs re- identified.  No new lytic or sclerotic osseous lesion.  IMPRESSION: New multilobar confluent alveolar airspace consolidation with air bronchogram formation.  Some areas demonstrate some nodularity but no other dominant pulmonary nodule or mass is identified.  Findings are most consistent with alveolar filling processes such as hemorrhage, pus/pneumonia, protein, or cells such as with metastatic disease.  Mild mediastinal lymphadenopathy with could be reactive however metastatic disease could also have this appearance.  Moderate pericardial effusion, increased since previous exam.  Cardiomegaly.  No CT evidence for acute pulmonary embolism.   Original Report Authenticated By: Conchita Paris, M.D.    EKG: Independently reviewed. Atrial fibrillation with controlled ventricular rate. No acute changes.  Assessment/Plan Principal Problem:   Cough with hemoptysis Active Problems:   HYPOTHYROIDISM    HYPERLIPIDEMIA   HYPONATREMIA   HYPERTENSION   Type 2 diabetes mellitus   Atrial fibrillation   Anemia   Dehydration   Acute renal failure   1. Hemoptysis: This is likely secondary to alveolar hemorrhage worsened by anticoagulation. DD-autoimmune process/Wegener's disease, atypical pneumonia, non-benign process versus other etiology. Admit to telemetry. Anticoagulation held. Continue empiric IV antibiotics-Rocephin and azithromycin. Pulmonology consulted-extensive workup per their note. 2. Dehydration: Secondary to poor oral intake. Brief IV fluids. 3. Acute renal failure: Secondary to dehydration in the context of ARB. Temporarily hold ARB and briefly hydrate with IV fluids. Follow BMP in a.m. 4. Anemia: Acute on chronic. Do not believe that her drop in hemoglobin is all do to hemoptysis. Follow CBC in a.m. and transfuse if hemoglobin less than 7 g per DL. Type and screen done. 5. Hyponatremia: Baseline sodium probably in the low 130s. Current low sodium may be secondary to dehydration. Other DD-SIADH. Briefly hydrate and follow BMP. 6. Atrial fibrillation: Controlled ventricular rate. Hold anticoagulation secondary to hemoptysis. Discussed with patient's primary cardiologist. Continue Bystolic. 7. Type II DM: Hold by mouth medications. Sliding scale insulin. 8. Hypertension: Temporarily hold ARB secondary to acute renal insufficiency. Continue amlodipine and Bystolic. 9. Hypothyroidism: Continue Synthroid.    Code Status: Full  Family Communication: Discussed with patient spouse and son at bedside.  Disposition Plan: Home when medically stable.   Time spent: 70 minutes.  [facility] Pager [phone]  If 7PM-7AM, please contact night-coverage [email or web address] Password [email or web address] 11/01/2012, 11:30 AM

## 2012-11-01 NOTE — Consult Note (Addendum)
PULMONARY  / CRITICAL CARE MEDICINE  Name: [name] [name] MRN: [id number] DOB: 02-09-1933    ADMISSION DATE:  11/01/2012 CONSULTATION DATE:  11/01/2012    REFERRING MD :  Dr Algis Liming PRIMARY SERVICE: [facility]  CHIEF COMPLAINT:  Hemoptysis, ? alv hge  HPI: 79 year old former smoker with breast cancer 2007 in complete remission. Currently presented with hemoptysis with CT scan chest findings of alveolar hemorrhage and pulmonary critical care medicine consulted.  She's noticed to be on direct thrombin inhibitor for atrial  fibrillation  Background pulmonary history dates back to December 2012 when she had symptoms of polymyalgia rheumatica with the ESR 120 but autoimmune antibody was positive for c-ANCA at 1:320. Radiological imaging showed diffuse bilateral groundglass opacities. She at that time had clinical response to prednisone.  surgical lung biopsy in January 2013 of the left side showed organizing pneumonia along with focal atypical glands but was diagnosed as low-grade new endocrine malignancy [subsequent 5-HIAA 24 hour urine test was normal]. I am not exactly sure whether she was treated with prednisone subsequently and if so for how long. She denies or does not recollect being on prolonged prednisone but certainly by June 7169 a CT scan of the chest showed clearance and she is being clinically monitored. A followup CT scan of the chest in December 2013 continue to show clear lung fields except for a new left upper lobe 1 cm groundglass opacity but this too on followup in April 2014 showed improvement.  As of June 2014 the oncologist did not feel there was any evidence of breast cancer recurrence  Currently 10/24/2012 she is admitted for 2-3 week history of cough with mild hemoptysis and associated nausea. Over the last few days she's having worsening symptoms. Hemoptysis is not massive but is constant and small amounts. She presented to the emergency department after failing sinus treatment.  CT scan of the chest ruled out pulmonary embolism but showed severe bilateral groundglass opacities consistent with alveolar hemorrhage pattern.  Pulmonary consultation is being therefore called   PAST MEDICAL H X  1) BREAST CANCER HX - Dr Jana Hakim. Last seen June 10. 2014  She underwent initial mammogram on 08/06/2005. This showed a suspicious mass in the left breast. A diagnostic mammogram performed on 08/19/2005 showed a 5 x 4 x 4 mm spiculated mass with microcalfication of the left anterior upper outer quadrant at 1 o'clock. No palpable mass was identified. Ultrasound showed hypoechoic mass with some shadowing and microcalcifications. Biopsy was performed on 08/19/2005 and this showed an invasive mammary carcinoma intermediate grade, ER/PR positive at 94% and 86% respectively, proliferative index 9%, HER-2/neu was 2+ with no overexpression by FISH. No polysomy was seen with adjuvant in her chromosome 17 of 3. MRI scan of both breasts was performed showing a solitary abnormality in the left breast and right breast was normal. The patient elected to undergo lumpectomy and sentinel lymph node evaluation on 09/09/2005. The final pathology showed a 0.9 cm grade 3 of 3 invasive ductal cancer. Lymphovascular invasion was not seen though lymphovascular invasion was identified in the original biopsy. The solitary lymph node, which was identified, was negative for malignancy. She has had an unremarkable postoperative course  She underwent a left lumpectomy and sentinel lymph node sampling June of 2007 for a pT1b pN0, stage IA invasive ductal carcinoma, grade 3, strongly estrogen and progesterone receptor positive, with a low proliferation fraction and no HER-2 amplification. Her Oncotype DX predicted a 12% risk of distant recurrence within 10 years if  her only adjuvant treatment was tamoxifen for 5 years.  She completed radiation treatments September of 2007 and started Arimidex in October of 2007, completing  5 years October of 2012. She was also treated with Fosamax because of concerns regarding osteoporosis with aromatase inhibitors.   She understands that 5 years of aromatase inhibitor is superior to 5 years of tamoxifen in terms of risk reduction, by 2-3%. According to her final risk of this cancer coming back we'll be less than 10%.  We could have released the patient from followup here at this point, except for the fact that in January of 2013 she was found to have suspicious changes in her lung and underwent VATS under Cecille Rubin, the biopsy showing mostly chronic interstitial pneumonia, but with "focal atypical glands" noted by the consulting pathologist ([id number]).  2) PULMONARY NODULE and ILD H X - Dr Annamaria Boots - Last seen May 2014  Accordingly the patient is being followed closely under Clint Young and just had a restaging chest CT April of this year showing persistence of the groundglass nodules in the left upper lobe, but actual decrease in size.  04/03/11- 79 year old female former smoking history referred courtesy of Dr. Elease Hashimoto with concern of abnormal chest CT. Husband is an allergy patient here.  Dr. Erick Blinks recent office note reviewed "Followup from recent pneumonia. Patient had presented here with extreme myalgias and chronic pain along with bilateral shoulder hip pain. Very high sedimentation rate of 120. She almost total resolution of myalgias with low dose prednisone strongly suggesting polymyalgia rheumatica. She subsequently presented with cough with trace of blood but no dyspnea, fever, reported a pain. Chest x-ray showed bilateral infiltrates suggesting probable bilateral pneumonia the recommendation for CT scan. CT scan revealed no adenopathy and no evidence for a lung mass. Compatible with bilateral pneumonia. Patient placed on antibiotic and much better at this time with no fever and essentially no cough. Her muscle and joint pains are almost 100% resolved with low-dose  prednisone.  She did present back in September with severe sinus disease suggesting acute sinusitis. CT revealed no acute findings. She denies any arthralgias at this time. No skin rash. No fever."  She has been on prednisone 10 mg twice daily since December 13 and feels significantly better. Morning cough has cleared with residual trace phlegm and trace blood. No chest pain.  CT chest 03/23/2011 -images reviewed by me-bilateral patchy airspace disease with air bronchograms. No cavitation or definite nodularity seen.  She has had right total knee replacement but no generalized arthritis.  Childhood exposure to an uncle with tuberculosis. Her PPD skin test was negative.  She denies history of lung disease, GERD, DVT, kidney or liver disease.  Lab- C-ANCA POS 1:320, P-ANCA NEG. RA 1:16, ANA NEG22 year old female former smoking history referred courtesy of Dr. Elease Hashimoto with concern of abnormal chest CT. Husband is an allergy patient here.    05/13/11- 60 yoF former smoker followed for bilateral pulmonary infiltrates, incr C-ANCA, s/p VATS bx/ organizing pneumonia, Metastatic intestinal carcinoid. Marland Kitchen Hx breast Ca 2006/ Dr Truddie Coco  Husband here.  She returns now after left lung biopsy. She has expected left thoracotomy pain but is otherwise stable as she continues maintenance prednisone. Denies blood, fever, swollen glands, discolored sputum. Exertional dyspnea is not worse, allowing for the surgery.  I shared with her and her husband the available initial pathology: Patchy organizing pneumonia with chronic interstitial pneumonia and focal atypical glands. I reviewed the discussion about her at thoracic oncology conference.  The parenchymal lung process is not a vasculitis as I originally questioned. I told her there was an incidental finding of cancer cells with further identification pending but that this was a separate process. We agreed to refer her to Dr. Truddie Coco who managed her breast cancer.  After she had  left, personal communication from Dr. Jimmy Footman- neuroendocrine tumor staining consistent with metastatic intestinal carcinoid.   06/20/11 38 yoF former smoker followed for bilateral pulmonary infiltrates, incr C-ANCA, s/p VATS bx/ organizing pneumonia, Metastatic intestinal carcinoid. Marland Kitchen Hx breast Ca 2006/ Dr Truddie Coco Husband here  She says her breathing is now "fine" back to her normal. She denies fever, cough, sputum.  I spoke with Mrs. Else myself and had to explain the discovery of carcinoid after her lung biopsy. We also have the note from the [facility] indicating they discussed it with her. She has absolutely no recollection.  Little cough or shortness of breath now. Still a little posterior thoracotomy incision pain.  CT 05/19/11- images reviewed with them-  IMPRESSION:  1. Marked improvement in bilateral air space disease seen on  comparison CT of 03/24/2011. Near complete resolution.  2. Postoperative change in the left hemithorax consistent with  recent VATS.  Original Report Authenticated By: Suzy Bouchard, M.D.   11/07/11- 9 yoF former smoker followed for bilateral pulmonary infiltrates, incr C-ANCA, s/p VATS bx/ organizing pneumonia, Metastatic intestinal carcinoid. Marland Kitchen Hx breast Ca 2006/ Dr Truddie Coco  c/o Chronic vertigo, difficulty breathing and gets tired easy. Also some wheezing, post nasal, and cough.  Some days she feels much better than others, no real pattern. Occasional light cough to clear throat. We had called in prednisone, which did help. Ambulatory around home, but gets out little.  CT chest 09/09/11- I reviewed w/ her- there is significant patchy bilateral lower zone fibrotic scarring and some bronchial thickening.  IMPRESSION:  1. No evidence of thoracic metastatic disease or other active  process.  2. Small mild cardiomegaly and hiatal hernia.  Original Report Authenticated By: Marlaine Hind, M.D.    02/09/12- 74 yoF former smoker followed for bilateral pulmonary  infiltrates, incr C-ANCA, s/p VATS bx/ cryptogenic organizing pneumonia/ BOOP, Metastatic intestinal carcinoid. Marland Kitchen Hx breast Ca 2006/ Dr Truddie Coco  January had biopsy to ck for other lung condition. SOB and wheezing at times. Left sided pain around the Back.  Continues Advair 250. Due for followup chest CT in December for Dr. Jacquiline Doe. Not on prednisone. She says breathing is unchanged. Denies pain, blood or swollen nodes.   05/11/12- 70 yoF former smoker followed for bilateral pulmonary infiltrates, incr C-ANCA, s/p VATS bx/ cryptogenic organizing pneumonia/ BOOP, Metastatic intestinal carcinoid. Complicated by Hx breast Ca 2006/ Dr Truddie Coco. AFib/ Dr Martinique, DM.  She says her husband is developing dementia.  Deep breath makes her left flank feel tight where she had thoracentesis. Complains of itching since starting Eliquis.Atarax 50 mg was too strong and Benadryl did not help. Itching keeps her awake all night. No visible rash. She is convinced really unpleasant itching began when she started Eliquis. I explained this medication needs to be transitioned carefully to anything else and Dr. Martinique would need to be the one to manage that.  CT chest 03/26/12  . IMPRESSION:  1. No findings to strongly suggest metastatic disease to the  thorax.  2. There is a new focus of nodular ground-glass attenuation  measuring 1 cm in the anterior aspect of the left upper lobe which  is highly nonspecific. Initial follow-up by  chest CT without  contrast is recommended in 3 months to confirm persistence. This  recommendation follows the consensus statement: Recommendations for  the Management of Subsolid Pulmonary Nodules Detected at CT: A  Statement from the [employer] Society as published in Radiology  2013; 266:304-317.  3. Atherosclerosis, including left main and three-vessel coronary  artery disease. Assessment for potential risk factor  modification, dietary therapy or pharmacologic therapy may be  warranted,  if clinically indicated.  4. Slight increase in the small amount of pericardial fluid  compared to the prior examination. This is unlikely to be of  hemodynamic significance at this time.  5. Moderate cardiomegaly.  6. Additional incidental findings, similar to prior studies, as  discussed above.  Original Report Authenticated By: Vinnie Langton, M.D.    08/08/12- 62 yoF former smoker followed for bilateral pulmonary infiltrates/ nodules, incr C-ANCA, s/p VATS bx/ cryptogenic organizing pneumonia/ BOOP, Metastatic intestinal carcinoid. Complicated by Hx breast Ca 2006/ Dr Truddie Coco. AFib/ Dr Martinique, DM.  Itching stopped, while continuing Eliquis. She had changed detergents and fabric softener, but cause unknown.  Seasonal rhinitis symptoms now. Total IgE 05/11/2012 was less than 1.5.  New burning itch, intermittent, right lateral ribs x2 weeks. Has had shingles shot. No visible rash.  CT chest 08/02/12  IMPRESSION:  Interval persistence but decrease in size of the ground-glass  nodules seen in the anterior left upper lobe. Adenocarcinoma  cannot be excluded. Followup by CT is recommended in 12 months,  with continued annual surveillance for a minimum of 3 years.  These recommendations are taken from "Recommendations for the  Management of Subsolid Pulmonary Nodules Detected at CT: A  Statement from the [facility]" Radiology 2013; 266:1, 304-  317.  Original Report Authenticated By: Misty Stanley, M.D.    SIGNIFICANT EVENTS / STUDIES:  11/01/2012    CULTURES: Results for orders placed during the hospital encounter of 04/22/11  AFB CULTURE WITH SMEAR     Status: None   Collection Time    04/22/11  8:54 AM      Result Value Range Status   Specimen Description TISSUE LEFT LUNG   Final   Special Requests PATIENT ON FOLLOWING VANCOMYCIN   Final   ACID FAST SMEAR NO ACID FAST BACILLI SEEN   Final   Culture NO ACID FAST BACILLI ISOLATED IN 6 WEEKS   Final   Report Status 06/08/2011  FINAL   Final  FUNGUS CULTURE W SMEAR     Status: None   Collection Time    04/22/11  8:54 AM      Result Value Range Status   Specimen Description TISSUE LEFT LUNG   Final   Special Requests PATIENT ON FOLLOWING VANCOMYCIN   Final   Fungal Smear NO YEAST OR FUNGAL ELEMENTS SEEN   Final   Culture No Fungi Isolated in 4 Weeks   Final   Report Status 05/27/2011 FINAL   Final  TISSUE CULTURE     Status: None   Collection Time    04/22/11  8:54 AM      Result Value Range Status   Specimen Description TISSUE LEFT LUNG   Final   Special Requests PATIENT ON FOLLOWING VANCOMYCIN   Final   Gram Stain     Final   Value: NO WBC SEEN     NO SQUAMOUS EPITHELIAL CELLS SEEN     NO ORGANISMS SEEN   Culture NO GROWTH 3 DAYS   Final   Report Status 04/25/2011 FINAL  Final    ANTIBIOTICS: Anti-infectives   Start     Dose/Rate Route Frequency Ordered Stop   11/02/12 1000  azithromycin (ZITHROMAX) 500 mg in dextrose 5 % 250 mL IVPB     500 mg 250 mL/hr over 60 Minutes Intravenous Every 24 hours 11/01/12 1128     11/01/12 1200  cefTRIAXone (ROCEPHIN) 1 g in dextrose 5 % 50 mL IVPB     1 g 100 mL/hr over 30 Minutes Intravenous Every 24 hours 11/01/12 1128     11/01/12 1030  cefTRIAXone (ROCEPHIN) 1 g in dextrose 5 % 50 mL IVPB  Status:  Discontinued     1 g 100 mL/hr over 30 Minutes Intravenous  Once 11/01/12 1017 11/01/12 1128   11/01/12 1030  azithromycin (ZITHROMAX) 500 mg in dextrose 5 % 250 mL IVPB     500 mg 250 mL/hr over 60 Minutes Intravenous  Once 11/01/12 1017 11/01/12 1141         PAST MEDICAL HISTORY :  Past Medical History  Diagnosis Date  . HYPOTHYROIDISM 07/24/2008  . HYPERLIPIDEMIA 07/24/2008  . DEPRESSION 05/16/2009  . HYPERTENSION 07/24/2008  . Vertigo   . Diabetes mellitus type II   . Arthritis     r tkr  . CVA (cerebral vascular accident) 2008    mini stroke.no residual  . Heart murmur     stress test 2009.dr peter Martinique  . Intermittent vertigo   . Skin  abnormality     facial lesions .pt applying mupiracin to areas  . Atrial fibrillation   . Breast cancer    Past Surgical History  Procedure Laterality Date  . Knee surgery  2009    TKR  . Cholecystectomy  1980  . Joint replacement      r knee  . Breast surgery  2007    Lumpectomy, XRT 2006.l breast  . Video assisted thoracoscopy  04/22/2011    Procedure: VIDEO ASSISTED THORACOSCOPY;  Surgeon: Melrose Nakayama, MD;  Location: [facility];  Service: Thoracic;  Laterality: Left;  WITH BIOPSY   Prior to Admission medications   Medication Sig Start Date End Date Taking? Authorizing Provider  apixaban (ELIQUIS) 5 MG TABS tablet Take 1 tablet (5 mg total) by mouth 2 (two) times daily. 04/15/12   Peter M Martinique, MD  AZOR 5-40 MG per tablet TAKE ONE TABLET BY MOUTH EVERY DAY 03/07/12   [name], MD  BYSTOLIC 10 MG tablet TAKE ONE TABLET BY MOUTH EVERY DAY 02/27/12   [name], MD  CALCIUM CITRATE PO Take 1,200 mg by mouth daily.      Historical Provider, MD  cholecalciferol (VITAMIN D) 1000 UNITS tablet Take 3,000 Units by mouth daily.      Historical Provider, MD  diazepam (VALIUM) 5 MG tablet TAKE ONE TABLET BY MOUTH EVERY DAY AS NEEDED. 07/26/12   [name], MD  FeFum-FePo-FA-B Cmp-C-Zn-Mn-Cu (SE-TAN PLUS) 162-115.2-1 MG CAPS TAKE ONE CAPSULE BY MOUTH EVERY DAY 02/13/12   [name], MD  fluticasone (FLONASE) 50 MCG/ACT nasal spray Place 2 sprays into the nose daily. 10/27/12   [name], MD  Fluticasone-Salmeterol (ADVAIR) 250-50 MCG/DOSE AEPB Inhale 1 puff into the lungs every 12 (twelve) hours.    Historical Provider, MD  furosemide (LASIX) 20 MG tablet Take 1 tablet (20 mg total) by mouth daily. 01/23/12   Lucretia Kern, DO  gabapentin (NEURONTIN) 100 MG capsule Take 1 capsule (100 mg total) by mouth 2 (two) times  daily before a meal. 09/20/12   [name], MD  glipiZIDE (GLUCOTROL) 10 MG tablet TAKE ONE TABLET BY MOUTH TWICE DAILY 07/08/12   [name], MD  levothyroxine (SYNTHROID, LEVOTHROID) 125 MCG tablet TAKE ONE TABLET BY MOUTH EVERY DAY 04/12/12   [name], MD  loratadine (CLARITIN) 10 MG tablet Take 10 mg by mouth daily.    Historical Provider, MD  metFORMIN (GLUCOPHAGE-XR) 500 MG 24 hr tablet TAKE TWO TABLETS BY MOUTH TWICE DAILY 07/02/11   [name], MD  multivitamin (METANX) 3-35-2 MG TABS tablet TAKE ONE TABLET BY MOUTH EVERY DAY 08/18/11   [name], MD  ondansetron (ZOFRAN) 4 MG tablet Take 4 mg by mouth every 8 (eight) hours as needed for nausea.    Historical Provider, MD  Pyridoxine HCl (VITAMIN B-6 PO) Take 100 mg by mouth daily.     Historical Provider, MD  rosuvastatin (CRESTOR) 5 MG tablet Take 1 tablet (5 mg total) by mouth daily. 04/20/12   Peter M Martinique, MD  triamcinolone cream (KENALOG) 0.1 % Apply 1 application topically 2 (two) times daily. Compound 1:1 with Eucerin cream    Historical Provider, MD   Allergies  Allergen Reactions  . Codeine Sulfate     REACTION: GI upset  . Sulfonamide Derivatives     REACTION: GI upset  . Atarax (Hydroxyzine) Nausea Only and Rash    FAMILY HISTORY:  Family History  Problem Relation Age of Onset  . Arthritis Mother   . Rheum arthritis Mother   . Heart disease Father   . Coronary artery disease Father   . Cancer Sister     breast CA, both sisters  . Breast cancer Sister   . Coronary artery disease Brother   . Lung cancer Brother    SOCIAL HISTORY:  reports that she quit smoking about 30 years ago. Her smoking use included Cigarettes. She has a 6 pack-year smoking history. She has never used smokeless tobacco. She reports that she does not drink alcohol or use illicit drugs.  REVIEW OF SYSTEMS: As per history of present illness. Other positive findings are lethargy and dyspnea. Otherwise 10 point review systems is negative  SUBJECTIVE:   VITAL SIGNS: Temp:  [97.9 F (36.6 C)-98.9 F (37.2 C)] 97.9 F (36.6 C) (07/28 1132) Pulse  Rate:  [47-84] 84 (07/28 1132) Resp:  [20-31] 26 (07/28 1132) BP: (118-146)/(58-72) 138/67 mmHg (07/28 1132) SpO2:  [90 %-95 %] 95 % (07/28 1132) HEMODYNAMICS:   VENTILATOR SETTINGS:   INTAKE / OUTPUT: Intake/Output   None     PHYSICAL EXAMINATION: General:  Obese female no distress seated comfortably in the bed Neuro:  Alert and oriented x3, speech normal, moves all 4 extremities. CAM-ICU negative for delirium HEENT:  Pupils equal and reactive to light. Coughs periodically. Neck is supple no neck nodes Cardiovascular:  Irregularly irregular but not tachycardic. No murmurs Lungs:  No respiratory distress but bilateral crackles present Abdomen:  Obese, soft, nontender, no organomegaly, no tenderness Musculoskeletal:  No cyanosis, no clubbing, no pedal edema Skin:  Intact without any rashes  LABS: PULMONARY No results found for this basename: PHART, PCO2, PCO2ART, PO2, PO2ART, HCO3, TCO2, O2SAT,  in the last 168 hours  CBC  Recent Labs Lab 11/01/12 0825  HGB 7.9*  HCT 23.8*  WBC 9.9  PLT 268    COAGULATION  Recent Labs Lab 11/01/12 0825  INR 1.96*    CARDIAC   Recent Labs Lab 11/01/12 0825  TROPONINI <0.30   No results found for this basename: PROBNP,  in the last 168 hours   CHEMISTRY  Recent Labs Lab 10/27/12 1007 11/01/12 0825  NA 131* 125*  K 4.3 4.2  CL 98 92*  CO2 26 24  GLUCOSE 115* 180*  BUN 16 19  CREATININE 1.3* 1.46*  CALCIUM 9.0 8.7   The CrCl is unknown because both a height and weight (above a minimum accepted value) are required for this calculation.   LIVER  Recent Labs Lab 10/27/12 1007 11/01/12 0825  AST 21 21  ALT 14 11  ALKPHOS 53 72  BILITOT 0.9 1.0  PROT 7.9 7.6  ALBUMIN 3.7 3.0*  INR  --  1.96*     INFECTIOUS No results found for this basename: LATICACIDVEN, PROCALCITON,  in the last 168 hours   ENDOCRINE CBG (last 3)  No results found for this basename: GLUCAP,  in the last 72  hours       IMAGING x48h  Dg Chest 2 View  11/01/2012   *RADIOLOGY REPORT*  Clinical Data: Hemoptysis, cough, wheezing, hypertension, breast cancer  CHEST - 2 VIEW  Comparison: 06/20/2011 and 08/02/2012  Findings: Cardiomegaly again noted.  Atherosclerotic calcifications of thoracic aorta.  Old left upper rib fractures are again noted. There is patchy perihilar airspace disease bilaterally more confluent in the right midlung.  Bilateral multifocal pneumonia cannot be excluded.  Less likely multifocal interstitial tumor spread.  Clinical correlation is necessary.  Further evaluation with CT scan of the chest is recommended.  IMPRESSION: There is patchy perihilar airspace disease bilaterally more confluent in the right midlung.  Bilateral multifocal pneumonia cannot be excluded.  Less likely multifocal interstitial tumor spread.  Clinical correlation is necessary.  Further evaluation with CT scan of the chest is recommended.   Original Report Authenticated By: Lahoma Crocker, M.D.   Ct Angio Chest Pe W/cm &/or Wo Cm  11/01/2012   *RADIOLOGY REPORT*  Clinical Data: Hemoptysis, shortness of breath, history of breast cancer diagnosed 2007 status post left lumpectomy and radiation therapy  CT ANGIOGRAPHY CHEST  Technique:  Multidetector CT imaging of the chest using the standard protocol during bolus administration of intravenous contrast. Multiplanar reconstructed images including MIPs were obtained and reviewed to evaluate the vascular anatomy.  Contrast: 183m OMNIPAQUE IOHEXOL 350 MG/ML SOLN  Comparison: Chest radiograph 11/01/2012, CT 08/02/2012  Findings: The study is of adequate technical quality for evaluation for pulmonary embolism up to and including the 3rd order pulmonary arteries. No focal filling defect is seen to suggest acute pulmonary embolism.  Left breast lumpectomy changes are noted.  Heart size is moderately enlarged.  Moderate low density pericardial effusion is present. No pleural effusion  identified.  Moderate atheromatous aortic calcification without aneurysm.  Small pretracheal nodes are re- identified but slightly increased since previously; representative 0.9 cm pretracheal node identified image 25, previously 0.4 cm at the same anatomic level.  There are multiple new patchy areas of ground-glass opacity alveolar consolidation with air bronchogram formation.  The distribution is predominately central but there are areas that are relatively more peripheral, likely reflecting contiguous spread. Central airways are patent.  Some areas of pulmonary consolidation have a relatively more nodular appearance, for example at the lung bases measuring 1 cm left lower lobe image 63 and 0.7 cm right lower lobe image 63, respectively.  However, no discrete solid appearing pulmonary nodularity is otherwise identifiable.  Remote deformity of the left lateral fifth and sixth ribs re- identified.  No new lytic or sclerotic osseous lesion.  IMPRESSION: New multilobar confluent alveolar airspace consolidation with air bronchogram formation.  Some areas demonstrate some nodularity but no other dominant pulmonary nodule or mass is identified.  Findings are most consistent with alveolar filling processes such as hemorrhage, pus/pneumonia, protein, or cells such as with metastatic disease.  Mild mediastinal lymphadenopathy with could be reactive however metastatic disease could also have this appearance.  Moderate pericardial effusion, increased since previous exam.  Cardiomegaly.  No CT evidence for acute pulmonary embolism.   Original Report Authenticated By: Conchita Paris, M.D.       ASSESSMENT / PLAN:  PULMONARY A: The clinical picture is consistent with diffuse alveolar hemorrhage resulting in hemoptysis. Most likely cause of alveolar hemorrhage is autoimmune based on past history of autoimmune antibody positivity and lung biopsy showing organizing pneumonia in December 2012/January 2013. Prior C.-ANCA  positivity and current sinus symptoms suggests wegners though no evidence of vasculitis in Jan 2013 lung bx which is puzzling. 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 However, if alternate diagnosis other than Alevoelar hemorrhage is being considered bronchoscopy lavage might be needed Agree with holding Eliquis PRBC only for hgb < 7gm% - only 1 unit at a time Hold off empiric steroids unless rapid clinical worsening   D/w DR Algis Liming  Dr. Brand Males, M.D., [facility].C.P Pulmonary and Critical Care Medicine Staff Physician [facility] Pager: [phone], If no answer or between  15:00h - 7:00h: call [phone]  11/01/2012 12:25 PM

## 2012-11-01 NOTE — Progress Notes (Signed)
Gave patient spacer for her MDI and she tolerated well.

## 2012-11-01 NOTE — ED Notes (Addendum)
Per EMS- Patient c/o hemoptysis x 2 weeks, N/V since last night. zofran 4 mg IV given per EMS

## 2012-11-02 ENCOUNTER — Inpatient Hospital Stay (HOSPITAL_COMMUNITY): Payer: Medicare Other

## 2012-11-02 ENCOUNTER — Encounter (HOSPITAL_COMMUNITY): Payer: Medicare Other

## 2012-11-02 DIAGNOSIS — D649 Anemia, unspecified: Secondary | ICD-10-CM

## 2012-11-02 DIAGNOSIS — M3130 Wegener's granulomatosis without renal involvement: Secondary | ICD-10-CM

## 2012-11-02 DIAGNOSIS — N179 Acute kidney failure, unspecified: Secondary | ICD-10-CM

## 2012-11-02 DIAGNOSIS — R931 Abnormal findings on diagnostic imaging of heart and coronary circulation: Secondary | ICD-10-CM

## 2012-11-02 DIAGNOSIS — R9389 Abnormal findings on diagnostic imaging of other specified body structures: Secondary | ICD-10-CM

## 2012-11-02 DIAGNOSIS — I369 Nonrheumatic tricuspid valve disorder, unspecified: Secondary | ICD-10-CM

## 2012-11-02 LAB — CENTROMERE ANTIBODIES: Centromere Ab Screen: <1 [AU]/ml

## 2012-11-02 LAB — SODIUM, URINE, RANDOM: Sodium, Ur: 13 mEq/L

## 2012-11-02 LAB — URINALYSIS, ROUTINE W REFLEX MICROSCOPIC
Glucose, UA: NEGATIVE mg/dL
Protein, ur: 100 mg/dL — AB
Specific Gravity, Urine: 1.026 (ref 1.005–1.030)
Urobilinogen, UA: 1 mg/dL (ref 0.0–1.0)

## 2012-11-02 LAB — URINE MICROSCOPIC-ADD ON

## 2012-11-02 LAB — MPO/PR-3 (ANCA) ANTIBODIES
Myeloperoxidase Abs: <1 AU/mL (ref <20)
Serine Protease 3: 881 AU/mL — ABNORMAL HIGH (ref <20)

## 2012-11-02 LAB — STREP PNEUMONIAE URINARY ANTIGEN: Strep Pneumo Urinary Antigen: NEGATIVE

## 2012-11-02 LAB — ANA: Anti Nuclear Antibody(ANA): NEGATIVE

## 2012-11-02 LAB — BASIC METABOLIC PANEL
Chloride: 97 mEq/L (ref 96–112)
GFR calc Af Amer: 32 mL/min — ABNORMAL LOW (ref >90)
GFR calc non Af Amer: 27 mL/min — ABNORMAL LOW (ref >90)
Potassium: 4.6 mEq/L (ref 3.5–5.1)

## 2012-11-02 LAB — GLUCOSE, CAPILLARY
Glucose-Capillary: 167 mg/dL — ABNORMAL HIGH (ref 70–99)
Glucose-Capillary: 210 mg/dL — ABNORMAL HIGH (ref 70–99)

## 2012-11-02 LAB — LEGIONELLA ANTIGEN, URINE: Legionella Antigen, Urine: NEGATIVE

## 2012-11-02 LAB — CBC
HCT: 21.4 % — ABNORMAL LOW (ref 36.0–46.0)
Hemoglobin: 7 g/dL — ABNORMAL LOW (ref 12.0–15.0)
MCH: 30.3 pg (ref 26.0–34.0)
MCHC: 32.2 g/dL (ref 30.0–36.0)
MCV: 93.9 fL (ref 78.0–100.0)
Platelets: 228 K/uL (ref 150–400)
RBC: 2.28 MIL/uL — ABNORMAL LOW (ref 3.87–5.11)
RDW: 13.5 % (ref 11.5–15.5)
WBC: 8.8 K/uL (ref 4.0–10.5)

## 2012-11-02 LAB — ANTI-SCLERODERMA ANTIBODY: Scleroderma (Scl-70) (ENA) Antibody, IgG: 9 AU/mL (ref <30)

## 2012-11-02 LAB — CYCLIC CITRUL PEPTIDE ANTIBODY, IGG: Cyclic Citrullin Peptide Ab: <2 U/mL (ref 0.0–5.0)

## 2012-11-02 LAB — HEMOGLOBIN AND HEMATOCRIT, BLOOD: Hemoglobin: 7.9 g/dL — ABNORMAL LOW (ref 12.0–15.0)

## 2012-11-02 LAB — ANTI-DNA ANTIBODY, DOUBLE-STRANDED: ds DNA Ab: 3 IU/mL (ref <30)

## 2012-11-02 MED ORDER — ADULT MULTIVITAMIN W/MINERALS CH
1.0000 | ORAL_TABLET | Freq: Every day | ORAL | Status: DC
  Administered 2012-11-02 – 2012-11-08 (×7): 1 via ORAL
  Filled 2012-11-02 (×7): qty 1

## 2012-11-02 MED ORDER — SODIUM CHLORIDE 0.9 % IV SOLN
INTRAVENOUS | Status: AC
  Administered 2012-11-02 (×2): via INTRAVENOUS

## 2012-11-02 MED ORDER — DIAZEPAM 5 MG PO TABS: 5.0000 mg | ORAL_TABLET | Freq: Every day | ORAL | Status: DC | PRN

## 2012-11-02 MED ORDER — SODIUM CHLORIDE 0.9 % IV SOLN
500.0000 mg | Freq: Two times a day (BID) | INTRAVENOUS | Status: DC
  Administered 2012-11-02: 500 mg via INTRAVENOUS
  Filled 2012-11-02: qty 4

## 2012-11-02 MED ORDER — SODIUM CHLORIDE 0.9 % IV SOLN
500.0000 mg | Freq: Two times a day (BID) | INTRAVENOUS | Status: AC
  Administered 2012-11-03 – 2012-11-05 (×5): 500 mg via INTRAVENOUS
  Filled 2012-11-02 (×7): qty 4

## 2012-11-02 MED ORDER — GLUCERNA SHAKE PO LIQD
237.0000 mL | Freq: Two times a day (BID) | ORAL | Status: DC | PRN
  Filled 2012-11-02: qty 237

## 2012-11-02 NOTE — Consult Note (Signed)
PULMONARY  / CRITICAL CARE MEDICINE  Name: [name] [name] MRN: [id number] DOB: Mar 24, 1933    ADMISSION DATE:  11/01/2012 CONSULTATION DATE:  11/01/2012    REFERRING MD :  Dr Algis Liming PRIMARY SERVICE: [facility]  CHIEF COMPLAINT:  Hemoptysis, ? alv hge  HPI: 79 year old former smoker with breast cancer 2007 in complete remission. Currently presented with hemoptysis with CT scan chest findings of alveolar hemorrhage and pulmonary critical care medicine consulted.  She's noticed to be on direct thrombin inhibitor for atrial  fibrillation  Background pulmonary history dates back to December 2012 when she had symptoms of polymyalgia rheumatica with the ESR 120 but autoimmune antibody was positive for c-ANCA at 1:320. Radiological imaging showed diffuse bilateral groundglass opacities. She at that time had clinical response to prednisone.  surgical lung biopsy in January 2013 of the left side showed organizing pneumonia along with focal atypical glands (per Dr Annamaria Boots this an isolated focal finding only) but was diagnosed as low-grade new endocrine malignancy [subsequent 5-HIAA 24 hour urine test was normal]. I am not exactly sure whether she was treated with prednisone subsequently and if so for how long. She denies or does not recollect being on prolonged prednisone but certainly by June 0737 a CT scan of the chest showed clearance and she is being clinically monitored. A followup CT scan of the chest in December 2013 continue to show clear lung fields except for a new left upper lobe 1 cm groundglass opacity but this too on followup in April 2014 showed improvement.  As of June 2014 the oncologist did not feel there was any evidence of breast cancer recurrence  Currently 10/24/2012 she is admitted for 2-3 week history of cough with mild hemoptysis and associated nausea. Over the last few days she's having worsening symptoms. Hemoptysis is not massive but is constant and small amounts. She presented to the  emergency department after failing sinus treatment. CT scan of the chest ruled out pulmonary embolism but showed severe bilateral groundglass opacities consistent with alveolar hemorrhage pattern.  Pulmonary consultation is being therefore called       CULTURES: Results for orders placed during the hospital encounter of 04/22/11  AFB CULTURE WITH SMEAR     Status: None   Collection Time    04/22/11  8:54 AM      Result Value Range Status   Specimen Description TISSUE LEFT LUNG   Final   Special Requests PATIENT ON FOLLOWING VANCOMYCIN   Final   ACID FAST SMEAR NO ACID FAST BACILLI SEEN   Final   Culture NO ACID FAST BACILLI ISOLATED IN 6 WEEKS   Final   Report Status 06/08/2011 FINAL   Final  FUNGUS CULTURE W SMEAR     Status: None   Collection Time    04/22/11  8:54 AM      Result Value Range Status   Specimen Description TISSUE LEFT LUNG   Final   Special Requests PATIENT ON FOLLOWING VANCOMYCIN   Final   Fungal Smear NO YEAST OR FUNGAL ELEMENTS SEEN   Final   Culture No Fungi Isolated in 4 Weeks   Final   Report Status 05/27/2011 FINAL   Final  TISSUE CULTURE     Status: None   Collection Time    04/22/11  8:54 AM      Result Value Range Status   Specimen Description TISSUE LEFT LUNG   Final   Special Requests PATIENT ON FOLLOWING VANCOMYCIN   Final   Gram Stain  Final   Value: NO WBC SEEN     NO SQUAMOUS EPITHELIAL CELLS SEEN     NO ORGANISMS SEEN   Culture NO GROWTH 3 DAYS   Final   Report Status 04/25/2011 FINAL   Final    ANTIBIOTICS: Anti-infectives   Start     Dose/Rate Route Frequency Ordered Stop   11/02/12 1000  azithromycin (ZITHROMAX) 500 mg in dextrose 5 % 250 mL IVPB     500 mg 250 mL/hr over 60 Minutes Intravenous Every 24 hours 11/01/12 1128     11/01/12 1200  cefTRIAXone (ROCEPHIN) 1 g in dextrose 5 % 50 mL IVPB     1 g 100 mL/hr over 30 Minutes Intravenous Every 24 hours 11/01/12 1128     11/01/12 1030  cefTRIAXone (ROCEPHIN) 1 g in dextrose  5 % 50 mL IVPB  Status:  Discontinued     1 g 100 mL/hr over 30 Minutes Intravenous  Once 11/01/12 1017 11/01/12 1128   11/01/12 1030  azithromycin (ZITHROMAX) 500 mg in dextrose 5 % 250 mL IVPB     500 mg 250 mL/hr over 60 Minutes Intravenous  Once 11/01/12 1017 11/01/12 1141        SIGNIFICANT EVENTS / STUDIES:  11/01/2012 - admit     SUBJECTIVE:   11/02/12: Improved hemoptysuis. Less dysppneic. BUt creat worse, Hgb worse -g etting PRBC. PR-3 880!!! MPO - negative  VITAL SIGNS: Temp:  [97.9 F (36.6 C)-99.8 F (37.7 C)] 98.3 F (36.8 C) (07/29 1125) Pulse Rate:  [68-95] 68 (07/29 1125) Resp:  [18-20] 20 (07/29 1125) BP: (108-152)/(60-86) 110/60 mmHg (07/29 1125) SpO2:  [93 %-98 %] 98 % (07/29 0934) Weight:  [91.853 kg (202 lb 8 oz)-91.9 kg (202 lb 9.6 oz)] 91.9 kg (202 lb 9.6 oz) (07/29 0502) HEMODYNAMICS:   VENTILATOR SETTINGS:   INTAKE / OUTPUT: Intake/Output     07/28 0701 - 07/29 0700 07/29 0701 - 07/30 0700   P.O. 360 120   I.V. (mL/kg) 1451.3 (15.8) 250 (2.7)   Blood  137.5   Total Intake(mL/kg) 1811.3 (19.7) 507.5 (5.5)   Urine (mL/kg/hr) 200    Total Output 200     Net +1611.3 +507.5        Urine Occurrence 1 x      PHYSICAL EXAMINATION: General:  Obese female no distress seated comfortably in the bed Neuro:  Alert and oriented x3, speech normal, moves all 4 extremities. CAM-ICU negative for delirium HEENT:  Pupils equal and reactive to light. Coughs periodically. Neck is supple no neck nodes Cardiovascular:  Irregularly irregular but not tachycardic. No murmurs Lungs:  No respiratory distress but bilateral crackles present Abdomen:  Obese, soft, nontender, no organomegaly, no tenderness Musculoskeletal:  No cyanosis, no clubbing, no pedal edema Skin:  Intact without any rashes  LABS: PULMONARY No results found for this basename: PHART, PCO2, PCO2ART, PO2, PO2ART, HCO3, TCO2, O2SAT,  in the last 168 hours  CBC  Recent Labs Lab 11/01/12 0825  11/02/12 0430  HGB 7.9* 7.0*  HCT 23.8* 21.4*  WBC 9.9 8.8  PLT 268 228    COAGULATION  Recent Labs Lab 11/01/12 0825  INR 1.96*    CARDIAC    Recent Labs Lab 11/01/12 0825  TROPONINI <0.30   No results found for this basename: PROBNP,  in the last 168 hours   CHEMISTRY  Recent Labs Lab 10/27/12 1007 11/01/12 0825 11/02/12 0430  NA 131* 125* 129*  K 4.3 4.2 4.6  CL 98  92* 97  CO2 _0 GLUCOSE 115* 180* 149*  BUN _1 CREATININE 1.3* 1.46* 1.71*  CALCIUM 9.0 8.7 8.5   Estimated Creatinine Clearance: 31.5 ml/min (by C-G formula based on Cr of 1.71).   LIVER  Recent Labs Lab 10/27/12 1007 11/01/12 0825  AST 21 21  ALT 14 11  ALKPHOS 53 72  BILITOT 0.9 1.0  PROT 7.9 7.6  ALBUMIN 3.7 3.0*  INR  --  1.96*     INFECTIOUS  Recent Labs Lab 11/01/12 1140  LATICACIDVEN 1.7  PROCALCITON <0.10     ENDOCRINE CBG (last 3)   Recent Labs  11/01/12 2105 11/02/12 0729 11/02/12 1125  GLUCAP 146* 146* 218*         IMAGING x48h  Dg Chest 2 View  11/01/2012   *RADIOLOGY REPORT*  Clinical Data: Hemoptysis, cough, wheezing, hypertension, breast cancer  CHEST - 2 VIEW  Comparison: 06/20/2011 and 08/02/2012  Findings: Cardiomegaly again noted.  Atherosclerotic calcifications of thoracic aorta.  Old left upper rib fractures are again noted. There is patchy perihilar airspace disease bilaterally more confluent in the right midlung.  Bilateral multifocal pneumonia cannot be excluded.  Less likely multifocal interstitial tumor spread.  Clinical correlation is necessary.  Further evaluation with CT scan of the chest is recommended.  IMPRESSION: There is patchy perihilar airspace disease bilaterally more confluent in the right midlung.  Bilateral multifocal pneumonia cannot be excluded.  Less likely multifocal interstitial tumor spread.  Clinical correlation is necessary.  Further evaluation with CT scan of the chest is recommended.   Original  Report Authenticated By: Lahoma Crocker, M.D.   Ct Angio Chest Pe W/cm &/or Wo Cm  11/01/2012   *RADIOLOGY REPORT*  Clinical Data: Hemoptysis, shortness of breath, history of breast cancer diagnosed 2007 status post left lumpectomy and radiation therapy  CT ANGIOGRAPHY CHEST  Technique:  Multidetector CT imaging of the chest using the standard protocol during bolus administration of intravenous contrast. Multiplanar reconstructed images including MIPs were obtained and reviewed to evaluate the vascular anatomy.  Contrast: 151m OMNIPAQUE IOHEXOL 350 MG/ML SOLN  Comparison: Chest radiograph 11/01/2012, CT 08/02/2012  Findings: The study is of adequate technical quality for evaluation for pulmonary embolism up to and including the 3rd order pulmonary arteries. No focal filling defect is seen to suggest acute pulmonary embolism.  Left breast lumpectomy changes are noted.  Heart size is moderately enlarged.  Moderate low density pericardial effusion is present. No pleural effusion identified.  Moderate atheromatous aortic calcification without aneurysm.  Small pretracheal nodes are re- identified but slightly increased since previously; representative 0.9 cm pretracheal node identified image 25, previously 0.4 cm at the same anatomic level.  There are multiple new patchy areas of ground-glass opacity alveolar consolidation with air bronchogram formation.  The distribution is predominately central but there are areas that are relatively more peripheral, likely reflecting contiguous spread. Central airways are patent.  Some areas of pulmonary consolidation have a relatively more nodular appearance, for example at the lung bases measuring 1 cm left lower lobe image 63 and 0.7 cm right lower lobe image 63, respectively.  However, no discrete solid appearing pulmonary nodularity is otherwise identifiable.  Remote deformity of the left lateral fifth and sixth ribs re- identified.  No new lytic or sclerotic osseous lesion.   IMPRESSION: New multilobar confluent alveolar airspace consolidation with air bronchogram formation.  Some areas demonstrate some nodularity but no other dominant pulmonary nodule or mass is identified.  Findings are most consistent with alveolar filling processes such as hemorrhage, pus/pneumonia, protein, or cells such as with metastatic disease.  Mild mediastinal lymphadenopathy with could be reactive however metastatic disease could also have this appearance.  Moderate pericardial effusion, increased since previous exam.  Cardiomegaly.  No CT evidence for acute pulmonary embolism.   Original Report Authenticated By: Conchita Paris, M.D.       ASSESSMENT / PLAN:  PULMONARY A: The clinical picture is consistent with diffuse alveolar hemorrhage resulting in hemoptysis. Most likely cause of alveolar hemorrhage is autoimmune based on past history of autoimmune antibody positivity and lung biopsy showing organizing pneumonia in December 2012/January 2013. Prior C.-ANCA positivity and current sinus symptoms suggests wegners though no evidence of vasculitis in Jan 2013 lung bx which is puzzling. 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 MPO negative. Has Diffuse alveolar hemorrhage  Likely - capillaritis. C/w Granulomatous Polyangitis I think (former Medical illustrator).  Sepsis markers negative  P:   Continue empiric treatment for community per pneumonia although I do not think this is a problem; will dc this after rheum consult Await rest of autoimmune D/w  DR Hongalgi - rheum consult today/tomorrow. If  Not and creat worse, then will start IV steroid 11/03/12 +/- renal consult    Dr. Brand Males, M.D., [facility].C.P Pulmonary and Critical Care Medicine Staff Physician [place] [facility] Pager: [phone], If no answer or between  15:00h - 7:00h: call [phone]  11/02/2012 12:33  PM

## 2012-11-02 NOTE — Consult Note (Signed)
[name] [name] 11/02/2012 [name] [name] D Requesting Physician:  Dr Lynford Citizen  Reason for Consult:  AKI in pt with pulm hemorrahge and high pr3 ANCA titer HPI: The patient is a 79 y.o. year-old with hx of resp/sinus illness in 2012 with high ANCA thought to be Wegeners but lung biopsy did not show Wegener's.  Per pt she got about a month of pred therapy then it was stopped. Now presenting again with sinus problems and now SOB with diffuse nodular dense pulm infiltrates c/w hemorrage.  PR3 titer is + at 881, MPO is negative.  Creat was 1.3 on admission and is now 1.7.  UA is pending. No hx renal disease per pt.  Denies CP, abd pain, n/v/d, dysuria, hematuria, brown urine.      ROS  no fever, +chills  +severe fatigue, sinus congesetion and coughing up small amts of blood in am  no abd pain  no skin rash, no paralysis, no mouth ulcers  no confusion  Past Medical History  Past Medical History  Diagnosis Date  . HYPOTHYROIDISM 07/24/2008  . HYPERLIPIDEMIA 07/24/2008  . DEPRESSION 05/16/2009  . HYPERTENSION 07/24/2008  . Vertigo   . Diabetes mellitus type II   . Arthritis     r tkr  . CVA (cerebral vascular accident) 2008    mini stroke.no residual  . Heart murmur     stress test 2009.dr peter Martinique  . Intermittent vertigo   . Skin abnormality     facial lesions .pt applying mupiracin to areas  . Atrial fibrillation   . Breast cancer    Past Surgical History  Past Surgical History  Procedure Laterality Date  . Knee surgery  2009    TKR  . Cholecystectomy  1980  . Joint replacement      r knee  . Breast surgery  2007    Lumpectomy, XRT 2006.l breast  . Video assisted thoracoscopy  04/22/2011    Procedure: VIDEO ASSISTED THORACOSCOPY;  Surgeon: Melrose Nakayama, MD;  Location: [place] [facility];  Service: Thoracic;  Laterality: Left;  WITH BIOPSY   Family History  Family History  Problem Relation Age of Onset  . Arthritis Mother   . Rheum arthritis Mother   . Heart disease Father    . Coronary artery disease Father   . Cancer Sister     breast CA, both sisters  . Breast cancer Sister   . Coronary artery disease Brother   . Lung cancer Brother    Social History  reports that she quit smoking about 30 years ago. Her smoking use included Cigarettes. She has a 6 pack-year smoking history. She has never used smokeless tobacco. She reports that she does not drink alcohol or use illicit drugs. Allergies  Allergies  Allergen Reactions  . Codeine Sulfate     REACTION: GI upset  . Sulfonamide Derivatives     REACTION: GI upset  . Atarax (Hydroxyzine) Nausea Only and Rash   Home medications Prior to Admission medications   Medication Sig Start Date End Date Taking? Authorizing Provider  apixaban (ELIQUIS) 5 MG TABS tablet Take 1 tablet (5 mg total) by mouth 2 (two) times daily. 04/15/12  Yes Peter M Martinique, MD  AZOR 5-40 MG per tablet TAKE ONE TABLET BY MOUTH EVERY DAY 03/07/12  Yes [name], MD  BYSTOLIC 10 MG tablet TAKE ONE TABLET BY MOUTH EVERY DAY 02/27/12  Yes [name], MD  CALCIUM CITRATE PO Take 1,200 mg by mouth daily.  Yes Historical Provider, MD  cholecalciferol (VITAMIN D) 1000 UNITS tablet Take 3,000 Units by mouth daily.     Yes Historical Provider, MD  diazepam (VALIUM) 5 MG tablet TAKE ONE TABLET BY MOUTH EVERY DAY AS NEEDED. 07/26/12  Yes [name], MD  FeFum-FePo-FA-B Cmp-C-Zn-Mn-Cu (SE-TAN PLUS) 162-115.2-1 MG CAPS TAKE ONE CAPSULE BY MOUTH EVERY DAY 02/13/12  Yes [name], MD  fluticasone (FLONASE) 50 MCG/ACT nasal spray Place 2 sprays into the nose daily. 10/27/12  Yes [name], MD  glipiZIDE (GLUCOTROL) 10 MG tablet TAKE ONE TABLET BY MOUTH TWICE DAILY 07/08/12  Yes [name], MD  levothyroxine (SYNTHROID, LEVOTHROID) 125 MCG tablet TAKE ONE TABLET BY MOUTH EVERY DAY 04/12/12  Yes [name], MD  loratadine (CLARITIN) 10 MG tablet Take 10 mg by mouth daily.   Yes Historical Provider, MD  metFORMIN  (GLUCOPHAGE-XR) 500 MG 24 hr tablet TAKE TWO TABLETS BY MOUTH TWICE DAILY 07/02/11  Yes [name], MD  multivitamin (METANX) 3-35-2 MG TABS tablet TAKE ONE TABLET BY MOUTH EVERY DAY 08/18/11  Yes [name], MD  ondansetron (ZOFRAN) 4 MG tablet Take 4 mg by mouth every 8 (eight) hours as needed for nausea.   Yes Historical Provider, MD  Pyridoxine HCl (VITAMIN B-6 PO) Take 100 mg by mouth daily.    Yes Historical Provider, MD  rosuvastatin (CRESTOR) 5 MG tablet Take 1 tablet (5 mg total) by mouth daily. 04/20/12  Yes Peter M Martinique, MD  triamcinolone cream (KENALOG) 0.1 % Apply 1 application topically 2 (two) times daily. Compound 1:1 with Eucerin cream    Historical Provider, MD   Liver Function Tests  Recent Labs Lab 10/27/12 1007 11/01/12 0825  AST 21 21  ALT 14 11  ALKPHOS 53 72  BILITOT 0.9 1.0  PROT 7.9 7.6  ALBUMIN 3.7 3.0*   No results found for this basename: LIPASE, AMYLASE,  in the last 168 hours CBC  Recent Labs Lab 11/01/12 0825 11/02/12 0430  WBC 9.9 8.8  NEUTROABS 6.9  --   HGB 7.9* 7.0*  HCT 23.8* 21.4*  MCV 93.0 93.9  PLT 268 374   Basic Metabolic Panel  Recent Labs Lab 10/27/12 1007 11/01/12 0825 11/02/12 0430  NA 131* 125* 129*  K 4.3 4.2 4.6  CL 98 92* 97  CO2 _0 GLUCOSE 115* 180* 149*  BUN _1 CREATININE 1.3* 1.46* 1.71*  CALCIUM 9.0 8.7 8.5    Physical Exam  Blood pressure 133/71, pulse 82, temperature 99.4 F (37.4 C), temperature source Oral, resp. rate 20, height _2  (1.702 m), weight 91.9 kg (202 lb 9.6 oz), SpO2 96.00%. Gen: alert pleasant elderly WF not in distress, some rales heard at bedside on breathing Skin: no rash, cyanosis HEENT:  EOMI, sclera anicteric, throat clear Neck: + JVD 12 cm, no LAN Chest: diffuse coarse insp rales throughout most of both lungs post CV: regular, blowing 2/6 syst M at loudest at apex, pedal pulses strong Abdomen: soft, obese, nontender, no mass or ascites  Ext: no LE  edema, no joint effusion, no gangrene/ulcers Neuro: alert, Ox3, nonfocal   Impression/Recs: 1. Acute renal failure in pt with acute pulm hemorrhage, +ANCA and hx of similar lung problem in 2012.  Agree with CCM's assessment, pt should be treated aggressively with IV bolus steroids and a second agent, agree w empiric Rx for now. Will plan renal bx if indicated if/when resp status stabilizes. Check  UA, urine lytes. Explained to pt and she agrees to proceed.  Agree would not do PLEX right now, but would if pulm hemorrhage destabilizes.  No volume overload, will continue IVF's. Place foley cath, move to stepdown unit.  Have D/W CCM MD.  Will follow 2. Hx breast Ca 3. DM2 4. Hx light stroke 5. Hypothyroid 6. HL 7. DJD 8. Hx afib  Kelly Splinter  MD Pager [phone]    Cell  [phone] 11/02/2012, 5:39 PM

## 2012-11-02 NOTE — Progress Notes (Addendum)
[facility] PROGRESS NOTE  [name] [name] JSH:[id number] DOB: [birth year] DOA: 11/01/2012 PCP: [name], MD  Brief narrative 79 y.o. female with history of hypothyroidism, hyperlipidemia, hypertension, hyponatremia, anemia, type II DM, CVA, atrial fibrillation on Eliquis, former smoker, breast cancer in remission, cryptogenic organizing pneumonia, presented to the [facility] ED on 11/01/12 with worsening hemoptysis. She was recently treated as outpatient for sinusitis without antibiotics. She did have nasal and head stuffiness. In the ED, afebrile, normal WBC, sodium 125, hemoglobin 7.9 (baseline 10 g per DL) & CT chest negative for PE but shows bilateral multilobar airspace consolidation. Hospitalist admission was requested. Pulmonology consulted and felt that this was most likely alveolar hemorrhage possibly from autoimmune etiology- Wegener's.  Assessment/Plan: 1. Hemoptysis: This is likely secondary to alveolar hemorrhage worsened by anticoagulation. DD-autoimmune process/Wegener's disease (associated sinus symptoms and renal insufficiency), atypical pneumonia, non-benign process versus other etiology. Anticoagulation held. Continue empiric IV antibiotics-Rocephin and azithromycin. Pulmonology  consultation appreciated. Autoimmune workup pending. Unable to get inpatient rheumatology consultation. Dr. Chase Caller plans to start steroids today. Urine Legionella & pneumococcal antigen negative. ANA negative. Myeloperoxidase Abs < 1, Serine Protease 3: 881 (elevated), Scl-70 (ENA): 9, ESR: 90, RF 15, Centromere Ab < 1, ds DNA Ab: 3, CCP Ab: < 2. Most likely Wegener's disease-pulmonary starting high-dose steroids and recommends transferring to step down unit under CCM primary service, for close monitoring and management. 2. Dehydration: Secondary to poor oral intake. Continue IV fluids.  3. Acute renal failure: Secondary to dehydration in the context of ARB. Temporarily hold ARB and  briefly hydrate with IV fluids.  Creatinine has increased from 1.4 > 1.7. Check urine microscopy, urine electrolytes. Continue IV fluids. If creatinine continues to worsen, consider nephrology consultation on 11/03/12. Nephrology consulted. 4. Anemia: Acute on chronic. Do not believe that her drop in hemoglobin is all do to hemoptysis.  Hemoglobin dropped from 7.9 > 7 g/dL. Transfuse 1 unit of PRBC and follow CBC. 5. Hyponatremia: Baseline sodium probably in the low 130s. Other DD-SIADH.  Sodium has improved from 125 on admission >129. Will check serum and urine osmolarity. 6. Atrial fibrillation: Controlled ventricular rate. Held anticoagulation secondary to hemoptysis. Discussed with patient's primary cardiologist on admission . Continue Bystolic.  7. Type II DM: Hold by mouth medications. Sliding scale insulin.  Fluctuating but reasonable inpatient control. 8. Hypertension: Temporarily hold ARB secondary to acute renal insufficiency. Continue amlodipine and Bystolic.  9. Hypothyroidism: Continue Synthroid 10. Moderate Pericardial effusion seen on CT chest: No clinical features of tamponade.  Trivial pericardial effusion on echo. 11. Oscillating density in right atrium on echo? Thrombus: [place] cardiology input appreciated.  Code Status:  Full Family Communication:  None Disposition Plan:  Transfer to step down unit.  Consultants:  Pulmonology  Cardiology  Nephrology  Procedures:   None  Antibiotics:  IV Rocephin 7/28 >  IV azithromycin 7/28 >   HPI/Subjective:  Patient says that she did not have any coughing of blood until tiny amount this morning. Denies dyspnea.  Objective: Filed Vitals:   11/02/12 1125 11/02/12 1225 11/02/12 1325 11/02/12 1407  BP: 110/60 119/63 102/67 133/71  Pulse: 68 64 74 82  Temp: 98.3 F (36.8 C) 98.5 F (36.9 C) 98.8 F (37.1 C) 99.4 F (37.4 C)  TempSrc: Oral Oral Oral Oral  Resp: _0 Height:      Weight:      SpO2:    96%     Intake/Output Summary (Last 24 hours) at  11/02/12 1913 Last data filed at 11/02/12 1801  Gross per 24 hour  Intake   3149 ml  Output    400 ml  Net   2749 ml   Filed Weights   11/01/12 1410 11/02/12 0502  Weight: 91.853 kg (202 lb 8 oz) 91.9 kg (202 lb 9.6 oz)    Exam:   General exam: Comfortable.  Respiratory system:  Reduced breath sounds bilaterally with bilateral coarse crackles left > right but seems better than on admission. No wheezing or rhonchi. No increased work of breathing.  Cardiovascular system: S1 & S2 heard, RRR. No JVD, murmurs, gallops, clicks or pedal edema. Telemetry: A. fib with ventricular rate in the 60s-70s.  Gastrointestinal system: Abdomen is nondistended, soft and nontender. Normal bowel sounds heard.  Central nervous system: Alert and oriented. No focal neurological deficits.  Extremities: Symmetric 5 x 5 power.   Data Reviewed: Basic Metabolic Panel:  Recent Labs Lab 10/27/12 1007 11/01/12 0825 11/02/12 0430  NA 131* 125* 129*  K 4.3 4.2 4.6  CL 98 92* 97  CO2 _0 GLUCOSE 115* 180* 149*  BUN _1 CREATININE 1.3* 1.46* 1.71*  CALCIUM 9.0 8.7 8.5   Liver Function Tests:  Recent Labs Lab 10/27/12 1007 11/01/12 0825  AST 21 21  ALT 14 11  ALKPHOS 53 72  BILITOT 0.9 1.0  PROT 7.9 7.6  ALBUMIN 3.7 3.0*   No results found for this basename: LIPASE, AMYLASE,  in the last 168 hours No results found for this basename: AMMONIA,  in the last 168 hours CBC:  Recent Labs Lab 11/01/12 0825 11/02/12 0430 11/02/12 1805  WBC 9.9 8.8  --   NEUTROABS 6.9  --   --   HGB 7.9* 7.0* 7.9*  HCT 23.8* 21.4* 24.6*  MCV 93.0 93.9  --   PLT 268 228  --    Cardiac Enzymes:  Recent Labs Lab 11/01/12 0825 11/01/12 1140  CKTOTAL  --  90  TROPONINI <0.30  --    BNP (last 3 results) No results found for this basename: PROBNP,  in the last 8760 hours CBG:  Recent Labs Lab 11/01/12 1740 11/01/12 2105 11/02/12 0729  11/02/12 1125 11/02/12 1721  GLUCAP 145* 146* 146* 218* 167*    No results found for this or any previous visit (from the past 240 hour(s)).   Studies: Dg Chest 2 View  11/01/2012   *RADIOLOGY REPORT*  Clinical Data: Hemoptysis, cough, wheezing, hypertension, breast cancer  CHEST - 2 VIEW  Comparison: 06/20/2011 and 08/02/2012  Findings: Cardiomegaly again noted.  Atherosclerotic calcifications of thoracic aorta.  Old left upper rib fractures are again noted. There is patchy perihilar airspace disease bilaterally more confluent in the right midlung.  Bilateral multifocal pneumonia cannot be excluded.  Less likely multifocal interstitial tumor spread.  Clinical correlation is necessary.  Further evaluation with CT scan of the chest is recommended.  IMPRESSION: There is patchy perihilar airspace disease bilaterally more confluent in the right midlung.  Bilateral multifocal pneumonia cannot be excluded.  Less likely multifocal interstitial tumor spread.  Clinical correlation is necessary.  Further evaluation with CT scan of the chest is recommended.   Original Report Authenticated By: Lahoma Crocker, M.D.   Ct Angio Chest Pe W/cm &/or Wo Cm  11/01/2012   *RADIOLOGY REPORT*  Clinical Data: Hemoptysis, shortness of breath, history of breast cancer diagnosed 2007 status post left lumpectomy and radiation therapy  CT ANGIOGRAPHY CHEST  Technique:  Multidetector CT imaging of the chest using the standard protocol during bolus administration of intravenous contrast. Multiplanar reconstructed images including MIPs were obtained and reviewed to evaluate the vascular anatomy.  Contrast: 162m OMNIPAQUE IOHEXOL 350 MG/ML SOLN  Comparison: Chest radiograph 11/01/2012, CT 08/02/2012  Findings: The study is of adequate technical quality for evaluation for pulmonary embolism up to and including the 3rd order pulmonary arteries. No focal filling defect is seen to suggest acute pulmonary embolism.  Left breast lumpectomy  changes are noted.  Heart size is moderately enlarged.  Moderate low density pericardial effusion is present. No pleural effusion identified.  Moderate atheromatous aortic calcification without aneurysm.  Small pretracheal nodes are re- identified but slightly increased since previously; representative 0.9 cm pretracheal node identified image 25, previously 0.4 cm at the same anatomic level.  There are multiple new patchy areas of ground-glass opacity alveolar consolidation with air bronchogram formation.  The distribution is predominately central but there are areas that are relatively more peripheral, likely reflecting contiguous spread. Central airways are patent.  Some areas of pulmonary consolidation have a relatively more nodular appearance, for example at the lung bases measuring 1 cm left lower lobe image 63 and 0.7 cm right lower lobe image 63, respectively.  However, no discrete solid appearing pulmonary nodularity is otherwise identifiable.  Remote deformity of the left lateral fifth and sixth ribs re- identified.  No new lytic or sclerotic osseous lesion.  IMPRESSION: New multilobar confluent alveolar airspace consolidation with air bronchogram formation.  Some areas demonstrate some nodularity but no other dominant pulmonary nodule or mass is identified.  Findings are most consistent with alveolar filling processes such as hemorrhage, pus/pneumonia, protein, or cells such as with metastatic disease.  Mild mediastinal lymphadenopathy with could be reactive however metastatic disease could also have this appearance.  Moderate pericardial effusion, increased since previous exam.  Cardiomegaly.  No CT evidence for acute pulmonary embolism.   Original Report Authenticated By: GConchita Paris M.D.   Dg Chest Port 1 View  11/02/2012   *RADIOLOGY REPORT*  Clinical Data: Shortness of breath  PORTABLE CHEST - 1 VIEW  Comparison: 11/01/2012  Findings: Patchy infiltrates are again identified throughout both  lungs.  Given some technical variations of the film the overall appearance is stable.  The cardiac shadow remains enlarged.  No new focal abnormality is seen.  IMPRESSION: No change from prior exam   Original Report Authenticated By: MInez Catalina M.D.     Additional labs:   Scheduled Meds: . amLODipine  5 mg Oral Daily  . atorvastatin  10 mg Oral q1800  . azithromycin  500 mg Intravenous Q24H  . cefTRIAXone (ROCEPHIN)  IV  1 g Intravenous Q24H  . fluticasone  2 spray Each Nare Daily  . gabapentin  100 mg Oral BID AC  . insulin aspart  0-5 Units Subcutaneous QHS  . insulin aspart  0-9 Units Subcutaneous TID WC  . levothyroxine  125 mcg Oral QAC breakfast  . loratadine  10 mg Oral Daily  . [START ON 11/03/2012] methylPREDNISolone (SOLU-MEDROL) injection  500 mg Intravenous BID  . mometasone-formoterol  2 puff Inhalation BID  . multivitamin with minerals  1 tablet Oral Daily  . nebivolol  10 mg Oral Daily  . sodium chloride  3 mL Intravenous Q12H   Continuous Infusions: . sodium chloride 75 mL/hr at 11/02/12 09562   Principal Problem:   Cough with hemoptysis Active Problems:   HYPOTHYROIDISM   HYPERLIPIDEMIA   HYPONATREMIA  HYPERTENSION   Type 2 diabetes mellitus   Atrial fibrillation   Anemia   Dehydration   Acute renal failure   Diffuse pulmonary alveolar hemorrhage   Wegener's granulomatosis (granulomatosis with polyangiitis)   Abnormal echocardiogram    Time spent: 35 minutes.    [facility] Pager [phone].   If 8PM-8AM, please contact night-coverage at [email or web address], password [facility] 11/02/2012, 7:13 PM  LOS: 1 day

## 2012-11-02 NOTE — Consult Note (Signed)
CARDIOLOGY CONSULT NOTE    Patient ID: [name] [name] MRN: [id number] DOB/AGE: 1932/08/07 79 y.o.  Admit date: 11/01/2012 Referring Physician:  Algis Liming Primary Physician: [name], MD Primary Cardiologist:  [place] Reason for Consultation:  Abnormal Echo  Principal Problem:   Cough with hemoptysis Active Problems:   HYPOTHYROIDISM   HYPERLIPIDEMIA   HYPONATREMIA   HYPERTENSION   Type 2 diabetes mellitus   Atrial fibrillation   Anemia   Dehydration   Acute renal failure   Diffuse pulmonary alveolar hemorrhage   Wegener's granulomatosis (granulomatosis with polyangiitis)   HPI:   Charming 79 yo with complicated pulmonary history.  Previous smoker and history of breast CA.  2012 had interstitial lung abnormalities.  ? Metastatic carcinoid.  Rx with steroids for short time.  Sees Dr Keturah Barre regularly.  Been ill for 3-4 weeks with worsening dyspnea and cough.  Rx for pneumonia by primary.  Now has CT with progressive interstitial abnormalities possible hemmorhage.  She has seen Dr Martinique for chronic afib and is on anticoagulation.  Echo read by Dr Stanford Breed suggested ? RA thrombus.  CT scan showed no evidence of PE.  I reviewed her echo from 02/23/12 and today  She has evidence of worsening pulmonary HTN with PA pressure estimate of 58mHg  Both echoes show likely prominent Chiari network and not thrombus.    She also has had progessive renal dysfunction and to me her clinical presentation is consistant with Wegeners   _0 @ All other systems reviewed and negative except as noted above  Past Medical History  Diagnosis Date  . HYPOTHYROIDISM 07/24/2008  . HYPERLIPIDEMIA 07/24/2008  . DEPRESSION 05/16/2009  . HYPERTENSION 07/24/2008  . Vertigo   . Diabetes mellitus type II   . Arthritis     r tkr  . CVA (cerebral vascular accident) 2008    mini stroke.no residual  . Heart murmur     stress test 2009.dr peter jMartinique . Intermittent vertigo   . Skin abnormality    facial lesions .pt applying mupiracin to areas  . Atrial fibrillation   . Breast cancer     Family History  Problem Relation Age of Onset  . Arthritis Mother   . Rheum arthritis Mother   . Heart disease Father   . Coronary artery disease Father   . Cancer Sister     breast CA, both sisters  . Breast cancer Sister   . Coronary artery disease Brother   . Lung cancer Brother     History   Social History  . Marital Status: Married    Spouse Name: N/A    Number of Children: N/A  . Years of Education: N/A   Occupational History  . Not on file.   Social History Main Topics  . Smoking status: Former Smoker -- 0.50 packs/day for 12 years    Types: Cigarettes    Quit date: 06/04/1982  . Smokeless tobacco: Never Used  . Alcohol Use: No  . Drug Use: No  . Sexually Active: Yes    Birth Control/ Protection: Post-menopausal   Other Topics Concern  . Not on file   Social History Narrative  . No narrative on file    Past Surgical History  Procedure Laterality Date  . Knee surgery  2009    TKR  . Cholecystectomy  1980  . Joint replacement      r knee  . Breast surgery  2007    Lumpectomy, XRT 2006.l breast  . Video assisted  thoracoscopy  04/22/2011    Procedure: VIDEO ASSISTED THORACOSCOPY;  Surgeon: Melrose Nakayama, MD;  Location: [facility];  Service: Thoracic;  Laterality: Left;  WITH BIOPSY     . amLODipine  5 mg Oral Daily  . atorvastatin  10 mg Oral q1800  . azithromycin  500 mg Intravenous Q24H  . cefTRIAXone (ROCEPHIN)  IV  1 g Intravenous Q24H  . fluticasone  2 spray Each Nare Daily  . gabapentin  100 mg Oral BID AC  . insulin aspart  0-5 Units Subcutaneous QHS  . insulin aspart  0-9 Units Subcutaneous TID WC  . levothyroxine  125 mcg Oral QAC breakfast  . loratadine  10 mg Oral Daily  . [START ON 11/03/2012] methylPREDNISolone (SOLU-MEDROL) injection  500 mg Intravenous BID  . mometasone-formoterol  2 puff Inhalation BID  . multivitamin with minerals  1  tablet Oral Daily  . nebivolol  10 mg Oral Daily  . sodium chloride  3 mL Intravenous Q12H   . sodium chloride 75 mL/hr at 11/02/12 0914    Physical Exam: Blood pressure 133/71, pulse 82, temperature 99.4 F (37.4 C), temperature source Oral, resp. rate 20, height _0  (1.702 m), weight 202 lb 9.6 oz (91.9 kg), SpO2 96.00%.   Affect appropriate Pale elderly female HEENT: normal Neck supple with no adenopathy JVP elevated  no bruits no thyromegaly Lungs clear with no wheezing and good diaphragmatic motion Heart:  S1/S2 with MR and TR murmurs, no rub, gallop or click PMI normal Abdomen: benighn, BS positve, no tenderness, no AAA no bruit.  No HSM or HJR Distal pulses intact with no bruits No edema Neuro non-focal Skin warm and dry No muscular weakness   Labs:   Lab Results  Component Value Date   WBC 8.8 11/02/2012   HGB 7.0* 11/02/2012   HCT 21.4* 11/02/2012   MCV 93.9 11/02/2012   PLT 228 11/02/2012    Recent Labs Lab 11/01/12 0825 11/02/12 0430  NA 125* 129*  K 4.2 4.6  CL 92* 97  CO2 24 24  BUN 19 21  CREATININE 1.46* 1.71*  CALCIUM 8.7 8.5  PROT 7.6  --   BILITOT 1.0  --   ALKPHOS 72  --   ALT 11  --   AST 21  --   GLUCOSE 180* 149*   Lab Results  Component Value Date   CKTOTAL 90 11/01/2012   TROPONINI <0.30 11/01/2012    Lab Results  Component Value Date   CHOL 140 10/27/2012   CHOL 158 10/21/2011   CHOL 165 08/28/2010   Lab Results  Component Value Date   HDL 39.50 10/27/2012   HDL 50.50 10/21/2011   HDL 51.90 08/28/2010   Lab Results  Component Value Date   LDLCALC 77 10/27/2012   LDLCALC 96 04/04/2010   LDLCALC 66 05/10/2009   Lab Results  Component Value Date   TRIG 117.0 10/27/2012   TRIG 235.0* 10/21/2011   TRIG 205.0* 08/28/2010   Lab Results  Component Value Date   CHOLHDL 4 10/27/2012   CHOLHDL 3 10/21/2011   CHOLHDL 3 08/28/2010   Lab Results  Component Value Date   LDLDIRECT 75.3 10/21/2011   LDLDIRECT 98.5 08/28/2010        Radiology: Dg Chest 2 View  11/01/2012   *RADIOLOGY REPORT*  Clinical Data: Hemoptysis, cough, wheezing, hypertension, breast cancer  CHEST - 2 VIEW  Comparison: 06/20/2011 and 08/02/2012  Findings: Cardiomegaly again noted.  Atherosclerotic calcifications of thoracic aorta.  Old left upper rib fractures are again noted. There is patchy perihilar airspace disease bilaterally more confluent in the right midlung.  Bilateral multifocal pneumonia cannot be excluded.  Less likely multifocal interstitial tumor spread.  Clinical correlation is necessary.  Further evaluation with CT scan of the chest is recommended.  IMPRESSION: There is patchy perihilar airspace disease bilaterally more confluent in the right midlung.  Bilateral multifocal pneumonia cannot be excluded.  Less likely multifocal interstitial tumor spread.  Clinical correlation is necessary.  Further evaluation with CT scan of the chest is recommended.   Original Report Authenticated By: Lahoma Crocker, M.D.   Ct Angio Chest Pe W/cm &/or Wo Cm  11/01/2012   *RADIOLOGY REPORT*  Clinical Data: Hemoptysis, shortness of breath, history of breast cancer diagnosed 2007 status post left lumpectomy and radiation therapy  CT ANGIOGRAPHY CHEST  Technique:  Multidetector CT imaging of the chest using the standard protocol during bolus administration of intravenous contrast. Multiplanar reconstructed images including MIPs were obtained and reviewed to evaluate the vascular anatomy.  Contrast: 155m OMNIPAQUE IOHEXOL 350 MG/ML SOLN  Comparison: Chest radiograph 11/01/2012, CT 08/02/2012  Findings: The study is of adequate technical quality for evaluation for pulmonary embolism up to and including the 3rd order pulmonary arteries. No focal filling defect is seen to suggest acute pulmonary embolism.  Left breast lumpectomy changes are noted.  Heart size is moderately enlarged.  Moderate low density pericardial effusion is present. No pleural effusion identified.  Moderate  atheromatous aortic calcification without aneurysm.  Small pretracheal nodes are re- identified but slightly increased since previously; representative 0.9 cm pretracheal node identified image 25, previously 0.4 cm at the same anatomic level.  There are multiple new patchy areas of ground-glass opacity alveolar consolidation with air bronchogram formation.  The distribution is predominately central but there are areas that are relatively more peripheral, likely reflecting contiguous spread. Central airways are patent.  Some areas of pulmonary consolidation have a relatively more nodular appearance, for example at the lung bases measuring 1 cm left lower lobe image 63 and 0.7 cm right lower lobe image 63, respectively.  However, no discrete solid appearing pulmonary nodularity is otherwise identifiable.  Remote deformity of the left lateral fifth and sixth ribs re- identified.  No new lytic or sclerotic osseous lesion.  IMPRESSION: New multilobar confluent alveolar airspace consolidation with air bronchogram formation.  Some areas demonstrate some nodularity but no other dominant pulmonary nodule or mass is identified.  Findings are most consistent with alveolar filling processes such as hemorrhage, pus/pneumonia, protein, or cells such as with metastatic disease.  Mild mediastinal lymphadenopathy with could be reactive however metastatic disease could also have this appearance.  Moderate pericardial effusion, increased since previous exam.  Cardiomegaly.  No CT evidence for acute pulmonary embolism.   Original Report Authenticated By: GConchita Paris M.D.    EKG:  afib rate 80 nonspecific ST/T wave changes   ASSESSMENT AND PLAN:  Abnormal Echo:  Similar finding to echo 11/13 likely prominent Chiari network.  Doubtful to be thrombus on Rx Eliquis.  Also reviewed her CT scan that filled RA with contrast nicely and saw no mass/thrombus and chiari network looked prominent.  Her pulmonary status is tenuous and  would not suggest TEE unless she gets intubated.  Cardiac MRI may be useful but since the finding was present in 2013 and there is alternative clinical explanation for her abnormal CT I would proceed with pulmonary w/u and not do TEE or cardiac MRI at this  time Pulmonary: Findings consistant with Wegeners now with elevated Cr.  Steroids and consult nephrology for possible  Kidney biopsy and other immunoRx  Has echo findings of chronic elevation in pulmonary pressures with RV dilatation and moderate TR which may make repeat lung biopsy more dangerous Afib:  Stop anticoagulation given pulmonary hemorrhage and possible need for biopsy rate control is fine.    Transfer to unit for closer observation of pulmonary status  Signed: Jenkins Rouge 11/02/2012, 6:00 PM

## 2012-11-02 NOTE — Progress Notes (Signed)
INITIAL NUTRITION ASSESSMENT  DOCUMENTATION CODES Per approved criteria  -Obesity Unspecified   INTERVENTION: Provide Glucerna shakes BID  Provide Multivitamin with minerals Encourage PO intake  NUTRITION DIAGNOSIS: Inadequate oral intake related to poor appetite as evidenced by 3% wt loss in less than 2 months and PO intake 50% per RN notes.  Goal: Pt to meet >/= 90% of their estimated nutrition needs   Monitor:  PO intake Weight Labs  Reason for Assessment: Malnutrition Screening Tool, score of 3  79 y.o. female  Admitting Dx: Cough with hemoptysis  ASSESSMENT: 79 y.o. female with history of hypothyroidism, hyperlipidemia, hypertension, hyponatremia, anemia, type II DM, CVA, atrial fibrillation on Eliquis, former smoker, breast cancer in remission, cryptogenic organizing pneumonia, presented to the [facility] ED on 11/01/12 with 3 weeks history of worsening cough with blood.  Pt reports that she has had a poor appetite for the past 3 weeks and was eating very little PTA. Pt reports eating well at breakfast this morning but, poorly last night. Per nursing notes pt is eating 50% of meals. Pt thinks her usual weight is around 204 lbs.   Height: Ht Readings from Last 1 Encounters:  11/01/12 5' 7" (1.702 m)    Weight: Wt Readings from Last 1 Encounters:  11/02/12 202 lb 9.6 oz (91.9 kg)    Ideal Body Weight: 135 lbs  % Ideal Body Weight: 150%  Wt Readings from Last 10 Encounters:  11/02/12 202 lb 9.6 oz (91.9 kg)  10/27/12 204 lb (92.534 kg)  09/20/12 209 lb (94.802 kg)  09/14/12 207 lb 6.4 oz (94.076 kg)  08/09/12 209 lb 3.2 oz (94.892 kg)  07/17/12 202 lb (91.627 kg)  07/15/12 206 lb (93.441 kg)  05/11/12 202 lb 6.4 oz (91.808 kg)  04/20/12 201 lb 12.8 oz (91.536 kg)  04/02/12 201 lb 11.2 oz (91.491 kg)    Usual Body Weight: 204 lbs  % Usual Body Weight: 99%  BMI:  Body mass index is 31.72 kg/(m^2).  Estimated Nutritional Needs: Kcal:  1770-1920 Protein: 90-100 grams Fluid: 2.7 L  Skin: WDL  Diet Order: Carb Control  EDUCATION NEEDS: -No education needs identified at this time   Intake/Output Summary (Last 24 hours) at 11/02/12 1139 Last data filed at 11/02/12 1125  Gross per 24 hour  Intake 2318.75 ml  Output    200 ml  Net 2118.75 ml    Last BM: 7/27   Labs:   Recent Labs Lab 10/27/12 1007 11/01/12 0825 11/02/12 0430  NA 131* 125* 129*  K 4.3 4.2 4.6  CL 98 92* 97  CO2 _0 BUN _1 CREATININE 1.3* 1.46* 1.71*  CALCIUM 9.0 8.7 8.5  GLUCOSE 115* 180* 149*    CBG (last 3)   Recent Labs  11/01/12 2105 11/02/12 0729 11/02/12 1125  GLUCAP 146* 146* 218*    Scheduled Meds: . amLODipine  5 mg Oral Daily  . atorvastatin  10 mg Oral q1800  . azithromycin  500 mg Intravenous Q24H  . cefTRIAXone (ROCEPHIN)  IV  1 g Intravenous Q24H  . fluticasone  2 spray Each Nare Daily  . gabapentin  100 mg Oral BID AC  . insulin aspart  0-5 Units Subcutaneous QHS  . insulin aspart  0-9 Units Subcutaneous TID WC  . levothyroxine  125 mcg Oral QAC breakfast  . loratadine  10 mg Oral Daily  . mometasone-formoterol  2 puff Inhalation BID  . nebivolol  10 mg Oral  Daily  . sodium chloride  3 mL Intravenous Q12H    Continuous Infusions: . sodium chloride 75 mL/hr at 11/02/12 1282    Past Medical History  Diagnosis Date  . HYPOTHYROIDISM 07/24/2008  . HYPERLIPIDEMIA 07/24/2008  . DEPRESSION 05/16/2009  . HYPERTENSION 07/24/2008  . Vertigo   . Diabetes mellitus type II   . Arthritis     r tkr  . CVA (cerebral vascular accident) 2008    mini stroke.no residual  . Heart murmur     stress test 2009.dr peter Martinique  . Intermittent vertigo   . Skin abnormality     facial lesions .pt applying mupiracin to areas  . Atrial fibrillation   . Breast cancer     Past Surgical History  Procedure Laterality Date  . Knee surgery  2009    TKR  . Cholecystectomy  1980  . Joint replacement      r  knee  . Breast surgery  2007    Lumpectomy, XRT 2006.l breast  . Video assisted thoracoscopy  04/22/2011    Procedure: VIDEO ASSISTED THORACOSCOPY;  Surgeon: Melrose Nakayama, MD;  Location: [facility];  Service: Thoracic;  Laterality: Left;  WITH BIOPSY    Pryor Ochoa RD, LDN Inpatient Clinical Dietitian Pager: [phone] After Hours Pager: [phone]

## 2012-11-02 NOTE — Progress Notes (Addendum)
Case review   - Features are fitting in with severe GPA (formerly wegner): alveolar hemorrhage and now new onset renal failure since 11/02/2012  - Hospitalist told me no rheum consult in hospital available  - I had mentioned ot patient that she will need immunsuppresive Rx and she is in agreement. Husband and son were witness  PLAN  -1) check Anti-GBM   2)  renal consult Dr Harold Hedge called; ? Need plasma exchange - will need it if GBM positive or Alveolar Hge related hypoxemia worsens for sure but not sure if she needs it at current stage but for sure if hypoxemia worsens she needs PLEX  3)  START INDUCTION THERAPY with 2 drugs for SEVERE DISEASE  1) Drug #1 - STEROIDS  Pulse methylprednisolone (7 to 15 mg/kg to a maximum dose of 500 to 1000 mg/day for three days) iOral glucocorticoid therapy, either from day 4 if pulse methylprednisolone is given, typically consists of 1 mg/kg per day (maximum of 60 to 80 mg/day) of oral prednisone (or its equivalent) for  two to four weeks. If significant improvement is observed at this time, the dose of prednisone is tapered slowly, with the goal of reaching 20 mg/day by the end of two months [22,28].   - will start steroids 11/02/2012 - ok per Dr Harold Hedge  2) Drug #2 RITUXAN - choosing Rituxan over cyclophosphamide tdue to malignancy history, and also this is technically a "relapse" or  A "flare". Dose starting 11/03/12 will be  Induction therapy with rituximab (375 mg/m2 per week for four weeks).   - Wil need to inform patient sspeicifcs of this drug on  Before starting - consent  - check hepatitis B and C virus panel (hep B can falare up with rituxan)  3) PCP prophylaxis -  Updated 11/03/12: She says she has nasuea and vomit seeveral decades ago with bactrim when taken with sudafed. No rash. No anemia. She is willing to try it again   .d/w Dr Jana Hakim - oncologist who is ok with the plan   Dr. Brand Males, M.D., [facility].C.P Pulmonary and Critical  Care Medicine Staff Physician [place] [facility] Pager: [phone], If no answer or between  15:00h - 7:00h: call [phone]  11/02/2012 5:25 PM

## 2012-11-02 NOTE — Progress Notes (Signed)
Echo Lab  2D Echocardiogram completed.  [place], RDCS 11/02/2012 8:38 AM

## 2012-11-03 ENCOUNTER — Encounter (HOSPITAL_COMMUNITY): Payer: Medicare Other

## 2012-11-03 DIAGNOSIS — R0902 Hypoxemia: Secondary | ICD-10-CM

## 2012-11-03 DIAGNOSIS — M3130 Wegener's granulomatosis without renal involvement: Principal | ICD-10-CM

## 2012-11-03 DIAGNOSIS — I4891 Unspecified atrial fibrillation: Secondary | ICD-10-CM

## 2012-11-03 LAB — BASIC METABOLIC PANEL
CO2: 22 mEq/L (ref 19–32)
Calcium: 8.5 mg/dL (ref 8.4–10.5)
Chloride: 93 mEq/L — ABNORMAL LOW (ref 96–112)
Sodium: 124 mEq/L — ABNORMAL LOW (ref 135–145)

## 2012-11-03 LAB — HEPATIC FUNCTION PANEL
Albumin: 2.7 g/dL — ABNORMAL LOW (ref 3.5–5.2)
Bilirubin, Direct: 0.7 mg/dL — ABNORMAL HIGH (ref 0.0–0.3)
Indirect Bilirubin: 0.4 mg/dL (ref 0.3–0.9)
Total Bilirubin: 1.1 mg/dL (ref 0.3–1.2)

## 2012-11-03 LAB — SJOGRENS SYNDROME-B EXTRACTABLE NUCLEAR ANTIBODY: SSB (La) (ENA) Antibody, IgG: <1 AU/mL (ref <30)

## 2012-11-03 LAB — GLUCOSE, CAPILLARY
Glucose-Capillary: 257 mg/dL — ABNORMAL HIGH (ref 70–99)
Glucose-Capillary: 312 mg/dL — ABNORMAL HIGH (ref 70–99)

## 2012-11-03 LAB — HEPATITIS C ANTIBODY (REFLEX): HCV Ab: NEGATIVE

## 2012-11-03 LAB — URINE CULTURE

## 2012-11-03 LAB — ANCA TITERS: C-ANCA: 1:80 {titer} — ABNORMAL HIGH

## 2012-11-03 LAB — TYPE AND SCREEN
Antibody Screen: NEGATIVE
Unit division: 0

## 2012-11-03 LAB — CBC
HCT: 24.6 % — ABNORMAL LOW (ref 36.0–46.0)
MCV: 93.2 fL (ref 78.0–100.0)
Platelets: 257 10*3/uL (ref 150–400)
RBC: 2.64 MIL/uL — ABNORMAL LOW (ref 3.87–5.11)
WBC: 8.4 10*3/uL (ref 4.0–10.5)

## 2012-11-03 LAB — SJOGRENS SYNDROME-A EXTRACTABLE NUCLEAR ANTIBODY: SSA (Ro) (ENA) Antibody, IgG: 16 AU/mL (ref <30)

## 2012-11-03 LAB — HEPATITIS B SURFACE ANTIBODY,QUALITATIVE: Hep B S Ab: NEGATIVE

## 2012-11-03 LAB — HEPATITIS B CORE ANTIBODY, TOTAL: Hep B Core Total Ab: POSITIVE — AB

## 2012-11-03 LAB — HEPATITIS B SURFACE ANTIGEN: Hepatitis B Surface Ag: NEGATIVE

## 2012-11-03 LAB — PROCALCITONIN: Procalcitonin: 0.15 ng/mL

## 2012-11-03 MED ORDER — FUROSEMIDE 10 MG/ML IJ SOLN
80.0000 mg | Freq: Three times a day (TID) | INTRAMUSCULAR | Status: DC
  Administered 2012-11-03 – 2012-11-05 (×6): 80 mg via INTRAVENOUS
  Filled 2012-11-03 (×9): qty 8

## 2012-11-03 MED ORDER — SODIUM CHLORIDE 0.9 % IV SOLN
375.0000 mg/m2 | Freq: Once | INTRAVENOUS | Status: DC
  Administered 2012-11-03: 800 mg via INTRAVENOUS
  Filled 2012-11-03: qty 80

## 2012-11-03 MED ORDER — SULFAMETHOXAZOLE-TMP DS 800-160 MG PO TABS
1.0000 | ORAL_TABLET | ORAL | Status: DC
  Administered 2012-11-03 – 2012-11-08 (×3): 1 via ORAL
  Filled 2012-11-03 (×3): qty 1

## 2012-11-03 MED ORDER — SODIUM CHLORIDE 0.9 % IV SOLN
INTRAVENOUS | Status: DC
  Administered 2012-11-03: 13:00:00 via INTRAVENOUS

## 2012-11-03 MED ORDER — ONDANSETRON HCL 4 MG/2ML IJ SOLN
4.0000 mg | Freq: Three times a day (TID) | INTRAMUSCULAR | Status: DC | PRN
  Administered 2012-11-03: 4 mg via INTRAVENOUS
  Filled 2012-11-03: qty 2

## 2012-11-03 MED ORDER — METOCLOPRAMIDE HCL 5 MG/ML IJ SOLN
10.0000 mg | Freq: Once | INTRAMUSCULAR | Status: AC
  Administered 2012-11-03: 10 mg via INTRAVENOUS
  Filled 2012-11-03: qty 2

## 2012-11-03 MED ORDER — HEPARIN SODIUM (PORCINE) 5000 UNIT/ML IJ SOLN
5000.0000 [IU] | Freq: Two times a day (BID) | INTRAMUSCULAR | Status: DC
  Administered 2012-11-03 – 2012-11-08 (×11): 5000 [IU] via SUBCUTANEOUS
  Filled 2012-11-03 (×12): qty 1

## 2012-11-03 MED ORDER — DIPHENHYDRAMINE HCL 25 MG PO CAPS
25.0000 mg | ORAL_CAPSULE | Freq: Once | ORAL | Status: AC
  Administered 2012-11-03: 25 mg via ORAL
  Filled 2012-11-03: qty 1

## 2012-11-03 NOTE — Progress Notes (Signed)
Subjective: Moved to ICU/SDU, no change in baseline sob, mild hemptysis, 7/29 cxr showed worsening infiltrates.  330cc recorded uop yest but didn't have foley in til late in the day  Physical Exam:  Blood pressure 122/65, pulse 72, temperature 97.5 F (36.4 C), temperature source Oral, resp. rate 16, height _0  (1.702 m), weight 91 kg (200 lb 9.9 oz), SpO2 97.00%. Gen: alert, mild dyspnea at rest Skin: no rash, cyanosis Neck: + JVD 12 cm, no LAN  Chest: diffuse end insp rales 2/3 bilat lung fields CV: regular, blowing 2/6 syst M at loudest at apex, pedal pulses strong  Abdomen: soft, obese, nontender, no mass or ascites  Ext: no LE edema, no joint effusion, no gangrene/ulcers  Neuro: alert, Ox3, nonfocal   UA- 100 prot, 11-20 rbcs  Impression/Recs  1. Acute kidney injury / ANCA+ / pulm hemorrhage / microhematuria- suspect glom disease, getting induction Rx w/ IV bolus steroids and rituximab. Creat up today. CXR worse yest, I/O ++ and not making much urine, concerned about edema superimposed on inflamm process, will stop IVF and start IV lasix. Poor candidate for renal bx at this time, given hx, ANCA+ and high prob pulm hemorrhage, agree with empiric Rx at this time. Would get renal bx if situation stabilizes 2. Hyponatremia- prob from vol overload 3. Hyperkalemia- change to renal diet, lasix should help   4. Hx breast Ca 5. DM2 6. Hx afib  Kelly Splinter  MD Pager [phone]    Cell  [phone] 11/03/2012, 2:32 PM   Recent Labs Lab 11/01/12 0825 11/02/12 0430 11/03/12 0310  NA 125* 129* 124*  K 4.2 4.6 5.5*  CL 92* 97 93*  CO2 _1 GLUCOSE 180* 149* 275*  BUN 19 21 31*  CREATININE 1.46* 1.71* 2.11*  CALCIUM 8.7 8.5 8.5    Recent Labs Lab 11/01/12 0825 11/02/12 0430 11/02/12 1805 11/03/12 0310  WBC 9.9 8.8  --  8.4  NEUTROABS 6.9  --   --   --   HGB 7.9* 7.0* 7.9* 8.1*  HCT 23.8* 21.4* 24.6* 24.6*  MCV 93.0 93.9  --  93.2  PLT 268 228  --  257

## 2012-11-03 NOTE — Progress Notes (Signed)
Give Rituximab despite labs to treat Granulomatous Polyangitis per Dr. Leonia Corona, PharmD, BCPS Pager: [phone] 11:45 AM Pharmacy #: [id number]

## 2012-11-03 NOTE — Progress Notes (Addendum)
PULMONARY  / CRITICAL CARE MEDICINE  Name: [name] [name] MRN: [id number] DOB: 1932/09/11    ADMISSION DATE:  11/01/2012 CONSULTATION DATE:  11/01/2012    REFERRING MD :  Dr Algis Liming PRIMARY SERVICE: [facility]  CHIEF COMPLAINT:  Hemoptysis, ? alv hge  HPI: 79 year old former smoker with breast cancer 2007 in complete remission. Currently presented with hemoptysis with CT scan chest findings of alveolar hemorrhage and pulmonary critical care medicine consulted.  She's noticed to be on direct thrombin inhibitor for atrial  fibrillation  Background pulmonary history dates back to December 2012 when she had symptoms of polymyalgia rheumatica with the ESR 120 but autoimmune antibody was positive for c-ANCA at 1:320. Radiological imaging showed diffuse bilateral groundglass opacities. She at that time had clinical response to prednisone.  surgical lung biopsy in January 2013 of the left side showed organizing pneumonia along with focal atypical glands (per Dr Annamaria Boots this an isolated focal finding only) but was diagnosed as low-grade new endocrine malignancy [subsequent 5-HIAA 24 hour urine test was normal]. I am not exactly sure whether she was treated with prednisone subsequently and if so for how long. She denies or does not recollect being on prolonged prednisone but certainly by June 4098 a CT scan of the chest showed clearance and she is being clinically monitored. A followup CT scan of the chest in December 2013 continue to show clear lung fields except for a new left upper lobe 1 cm groundglass opacity but this too on followup in April 2014 showed improvement.  As of June 2014 the oncologist did not feel there was any evidence of breast cancer recurrence  Currently 10/24/2012 she is admitted for 2-3 week history of cough with mild hemoptysis and associated nausea. Over the last few days she's having worsening symptoms. Hemoptysis is not massive but is constant and small amounts. She presented to the  emergency department after failing sinus treatment. CT scan of the chest ruled out pulmonary embolism but showed severe bilateral groundglass opacities consistent with alveolar hemorrhage pattern.  Pulmonary consultation is being therefore called       CULTURES: Results for orders placed during the hospital encounter of 11/01/12  MRSA PCR SCREENING     Status: None   Collection Time    11/02/12  7:27 PM      Result Value Range Status   MRSA by PCR NEGATIVE  NEGATIVE Final   Comment:            The GeneXpert MRSA Assay (FDA     approved for NASAL specimens     only), is one component of a     comprehensive MRSA colonization     surveillance program. It is not     intended to diagnose MRSA     infection nor to guide or     monitor treatment for     MRSA infections.    ANTIBIOTICS: Anti-infectives   Start     Dose/Rate Route Frequency Ordered Stop   11/03/12 1030  sulfamethoxazole-trimethoprim (BACTRIM DS) 800-160 MG per tablet 1 tablet     1 tablet Oral Once per day on Mon Wed Fri 11/03/12 1024     11/02/12 1000  azithromycin (ZITHROMAX) 500 mg in dextrose 5 % 250 mL IVPB  Status:  Discontinued     500 mg 250 mL/hr over 60 Minutes Intravenous Every 24 hours 11/01/12 1128 11/03/12 1025   11/01/12 1200  cefTRIAXone (ROCEPHIN) 1 g in dextrose 5 % 50 mL IVPB  Status:  Discontinued     1 g 100 mL/hr over 30 Minutes Intravenous Every 24 hours 11/01/12 1128 11/03/12 1025   11/01/12 1030  cefTRIAXone (ROCEPHIN) 1 g in dextrose 5 % 50 mL IVPB  Status:  Discontinued     1 g 100 mL/hr over 30 Minutes Intravenous  Once 11/01/12 1017 11/01/12 1128   11/01/12 1030  azithromycin (ZITHROMAX) 500 mg in dextrose 5 % 250 mL IVPB     500 mg 250 mL/hr over 60 Minutes Intravenous  Once 11/01/12 1017 11/01/12 1141        SIGNIFICANT EVENTS / STUDIES:  11/01/2012 - admit  11/02/12: GPA diagnoseed (PR-3 880)  STart 3 days solumedrold 543m bid x 3 days/6 doses New onset renal failur - renal  consult    SUBJECTIVE/OVERNIGHT/INTERVAL HX 11/03/12: improved dyspnea but worsening creat. She thhink she is in renal failure due to drinking diet soda and not enough water.  STarted solumedrold yesterday    VITAL SIGNS: Temp:  [97.4 F (36.3 C)-99.5 F (37.5 C)] 97.4 F (36.3 C) (07/30 0800) Pulse Rate:  [59-94] 72 (07/30 0600) Resp:  [16-33] 16 (07/30 0600) BP: (102-150)/(60-92) 122/65 mmHg (07/30 0600) SpO2:  [88 %-98 %] 97 % (07/30 0600) Weight:  [91 kg (200 lb 9.9 oz)-91.2 kg (201 lb 1 oz)] 91 kg (200 lb 9.9 oz) (07/30 0400) HEMODYNAMICS:   VENTILATOR SETTINGS:   INTAKE / OUTPUT: Intake/Output     07/29 0701 - 07/30 0700 07/30 0701 - 07/31 0700   P.O. 440    I.V. (mL/kg) 1021.3 (11.2)    Blood 262.5    IV Piggyback 154    Total Intake(mL/kg) 1877.8 (20.6)    Urine (mL/kg/hr) 355 (0.2)    Total Output 355     Net +1522.8            PHYSICAL EXAMINATION: General:  Obese female no distress seated comfortably in the bed Neuro:  Alert and oriented x3, speech normal, moves all 4 extremities. CAM-ICU negative for delirium HEENT:  Pupils equal and reactive to light. No cough. Neck is supple no neck nodes Cardiovascular:  Irregularly irregular but not tachycardic. No murmurs Lungs:  No respiratory distress but bilateral crackles present but improved on 11/03/12 compared to 11/01/12 Abdomen:  Obese, soft, nontender, no organomegaly, no tenderness Musculoskeletal:  No cyanosis, no clubbing, no pedal edema Skin:  Intact without any rashes  LABS: PULMONARY No results found for this basename: PHART, PCO2, PCO2ART, PO2, PO2ART, HCO3, TCO2, O2SAT,  in the last 168 hours  CBC  Recent Labs Lab 11/01/12 0825 11/02/12 0430 11/02/12 1805 11/03/12 0310  HGB 7.9* 7.0* 7.9* 8.1*  HCT 23.8* 21.4* 24.6* 24.6*  WBC 9.9 8.8  --  8.4  PLT 268 228  --  257    COAGULATION  Recent Labs Lab 11/01/12 0825  INR 1.96*    CARDIAC    Recent Labs Lab 11/01/12 0825   TROPONINI <0.30   No results found for this basename: PROBNP,  in the last 168 hours   CHEMISTRY  Recent Labs Lab 11/01/12 0825 11/02/12 0430 11/03/12 0310  NA 125* 129* 124*  K 4.2 4.6 5.5*  CL 92* 97 93*  CO2 _0 GLUCOSE 180* 149* 275*  BUN 19 21 31*  CREATININE 1.46* 1.71* 2.11*  CALCIUM 8.7 8.5 8.5   Estimated Creatinine Clearance: 25.5 ml/min (by C-G formula based on Cr of 2.11).  URINE ANALYSIS  11/02/12: UA 11-20 RBC - microscopuic hematuria  LIVER  Recent Labs Lab 11/01/12 0825 11/03/12 0310  AST 21 21  ALT 11 12  ALKPHOS 72 93  BILITOT 1.0 1.1  PROT 7.6 7.3  ALBUMIN 3.0* 2.7*  INR 1.96*  --      INFECTIOUS  Recent Labs Lab 11/01/12 1140 11/03/12 0316  LATICACIDVEN 1.7  --   PROCALCITON <0.10 0.15     ENDOCRINE CBG (last 3)   Recent Labs  11/02/12 1721 11/02/12 2128 11/03/12 0835  GLUCAP 167* 210* 257*         IMAGING x48h  Dg Chest Port 1 View  11/02/2012   *RADIOLOGY REPORT*  Clinical Data: Shortness of breath  PORTABLE CHEST - 1 VIEW  Comparison: 11/01/2012  Findings: Patchy infiltrates are again identified throughout both lungs.  Given some technical variations of the film the overall appearance is stable.  The cardiac shadow remains enlarged.  No new focal abnormality is seen.  IMPRESSION: No change from prior exam   Original Report Authenticated By: Inez Catalina, M.D.       ASSESSMENT / PLAN:  PULMONARY A: GPA with ALveolar Hemorrhage. RElapse since 2013/2013. 11/02/12 PR-3 880. MPO negative. Likely - capillaritis. C/w Granulomatous Polyangitis  (former Medical illustrator).  Sepsis markers negative   11/02/12: started pulsed steroids 11/03/12: Improved lung exam and stable hgb  P:   - await GBM from 11/02/12 - if positive move to [facility] for Plasma Exchange - if hypoxemia worse - move to [facility] for Plasma Exchange  - - START INDUCTION THERAPY with 2 drugs for SEVERE DISEASE   - Drug #1 - STEROIDS Pulse methylprednisolone    537m bid x 6 doses starting PM of 11/02/12, Then Oral glucocorticoid therapy, consists prednisone of 1 mg/kg per day (maximum of 60 to 80 mg/day) of oral prednisone (or its equivalent) for two to four weeks.    - Drug #2 RITUXAN - Induction therapy with rituximab (375 mg/m2 per week for four weeks); first dose 11/03/12   -  PCP prophylaxis - BActrim 1 DS on M, W, F (She says she has nasuea and vomit seeveral decades ago with bactrim when taken with sudafed. No rash. No anemia. She is willing to try it again )   CARDIOVASCULAR A: A Fib. Rate controlled P:  Cards following, once GPA improved and hemoptysis better ok to start anticoagulation; with renal failure ? [employer]- cards to decie  RENAL A:  Microscopic Hematuria with new onset ATN 11/02/12 that is rapidly worse 11/03/12 P:   See pulm section If no improvement or getting worse, move to [facility] for renal bx vs PLEX Renal following Hydrate with normal saline at 100cc/h  GASTROINTESTINAL A:  Nil acute P:   Monitor Regular diet Await HEp Virus panel 11/03/12 in setting of Rituxan   HEMATOLOGIC A:  Anemia due to alveolar hemorrhage P:  Will start heparin sq bid 11/03/12 and monitor   INFECTIOUS A:  No evidence of infection P:   Check urine cutlure  ENDOCRINE A:  diabetes   P:   ssi  NEUROLOGIC A:  intact P:   monitor  TODAY'S SUMMARY: Critically ill GPA. Starting rituxan. Extensively described diagnosis, course, prognosis, side effects to sons x 2 and patient and husband. If worse, move to [facility] for PLEX  I have personally obtained a history, examined the patient, evaluated laboratory and imaging results, formulated the assessment and plan and placed orders.   CRITICAL CARE: The patient is critically ill with multiple organ systems failure and  requires high complexity decision making for assessment and support, frequent evaluation and titration of therapies, application of advanced monitoring technologies and  extensive interpretation of multiple databases. Critical Care Time devoted to patient care services described in this note is 45 minutes.     Dr. Brand Males, M.D., [facility].C.P Pulmonary and Critical Care Medicine Staff Physician [place] [facility] Pager: [phone], If no answer or between  15:00h - 7:00h: call [phone]  11/03/2012 10:41 AM

## 2012-11-03 NOTE — Progress Notes (Signed)
Inpatient Diabetes Program Recommendations  AACE/ADA: New Consensus Statement on Inpatient Glycemic Control (2013)  Target Ranges:  Prepandial:   less than 140 mg/dL      Peak postprandial:   less than 180 mg/dL (1-2 hours)      Critically ill patients:  140 - 180 mg/dL  Results for [name] [name], [name] [name] (MRN [id number]) as of 11/03/2012 09:51  Ref. Range 11/02/2012 11:25 11/02/2012 17:21 11/02/2012 21:28 11/03/2012 08:35  Glucose-Capillary Latest Range: 70-99 mg/dL 218 (H) 167 (H) 210 (H) 257 (H)    Inpatient Diabetes Program Recommendations Correction (SSI): consider increasing correction scale to resistant during steroid therapy Thank you  Raoul Pitch BSN, RN,CDE Inpatient Diabetes Coordinator [phone] (team pager)

## 2012-11-03 NOTE — Progress Notes (Signed)
PROGRESS NOTE  Subjective:    Pt is a 79 yo with hyx of breast CA, atrial fib , pulmonary HTN admitted with hemoptysis.  Anticoagulation has been held.    She is coughing up blood tinged sputum this am - not frank blood.       Vital Signs:   Temp:  [97.8 F (36.6 C)-99.5 F (37.5 C)] 97.8 F (36.6 C) (07/30 0400) Pulse Rate:  [59-94] 72 (07/30 0600) Resp:  [16-33] 16 (07/30 0600) BP: (102-150)/(60-92) 122/65 mmHg (07/30 0600) SpO2:  [88 %-98 %] 97 % (07/30 0600) Weight:  [200 lb 9.9 oz (91 kg)-201 lb 1 oz (91.2 kg)] 200 lb 9.9 oz (91 kg) (07/30 0400)  Last BM Date: 10/31/12   24-hour weight change: Weight change: -1 lb 7.1 oz (-0.653 kg)  Weight trends: Filed Weights   11/02/12 0502 11/02/12 2000 11/03/12 0400  Weight: 202 lb 9.6 oz (91.9 kg) 201 lb 1 oz (91.2 kg) 200 lb 9.9 oz (91 kg)    Intake/Output:  07/29 0701 - 07/30 0700 In: 1877.8 [P.O.:440; I.V.:1021.3; Blood:262.5; IV Piggyback:154] Out: 355 [Urine:355]     Physical Exam: BP 122/65  Pulse 72  Temp(Src) 97.8 F (36.6 C) (Oral)  Resp 16  Ht _0  (1.702 m)  Wt 200 lb 9.9 oz (91 kg)  BMI 31.41 kg/m2  SpO2 97%  General: Vital signs reviewed and noted.   Head: Normocephalic, atraumatic.  Eyes: conjunctivae/corneas clear.  EOM's intact.   Throat: normal  Neck:  normal  Lungs:    bilateral rales  Heart:  Irreg. Irreg. 5-4/2 systolic murmur  Abdomen:  Soft, non-tender, non-distended    Extremities: No edema   Neurologic: A&O X3, CN II - XII are grossly intact.   Psych: Normal     Labs: BMET:  Recent Labs  11/02/12 0430 11/03/12 0310  NA 129* 124*  K 4.6 5.5*  CL 97 93*  CO2 24 22  GLUCOSE 149* 275*  BUN 21 31*  CREATININE 1.71* 2.11*  CALCIUM 8.5 8.5    Liver function tests:  Recent Labs  11/01/12 0825 11/03/12 0310  AST 21 21  ALT 11 12  ALKPHOS 72 93  BILITOT 1.0 1.1  PROT 7.6 7.3  ALBUMIN 3.0* 2.7*   No results found for this basename: LIPASE, AMYLASE,  in the last  72 hours  CBC:  Recent Labs  11/01/12 0825 11/02/12 0430 11/02/12 1805 11/03/12 0310  WBC 9.9 8.8  --  8.4  NEUTROABS 6.9  --   --   --   HGB 7.9* 7.0* 7.9* 8.1*  HCT 23.8* 21.4* 24.6* 24.6*  MCV 93.0 93.9  --  93.2  PLT 268 228  --  257    Cardiac Enzymes:  Recent Labs  11/01/12 0825 11/01/12 1140  CKTOTAL  --  90  TROPONINI <0.30  --     Coagulation Studies:  Recent Labs  11/01/12 0825  LABPROT 21.7*  INR 1.96*    Other: No components found with this basename: POCBNP,  No results found for this basename: DDIMER,  in the last 72 hours No results found for this basename: HGBA1C,  in the last 72 hours No results found for this basename: CHOL, HDL, LDLCALC, TRIG, CHOLHDL,  in the last 72 hours No results found for this basename: TSH, T4TOTAL, FREET3, T3FREE, THYROIDAB,  in the last 72 hours No results found for this basename: VITAMINB12, FOLATE, FERRITIN, TIBC, IRON, RETICCTPCT,  in the last 72 hours  Other results:  Tele:  Atrial fib with controlled rate.   Medications:    Infusions: . sodium chloride 75 mL/hr at 11/02/12 2249    Scheduled Medications: . amLODipine  5 mg Oral Daily  . atorvastatin  10 mg Oral q1800  . azithromycin  500 mg Intravenous Q24H  . cefTRIAXone (ROCEPHIN)  IV  1 g Intravenous Q24H  . fluticasone  2 spray Each Nare Daily  . gabapentin  100 mg Oral BID AC  . insulin aspart  0-5 Units Subcutaneous QHS  . insulin aspart  0-9 Units Subcutaneous TID WC  . levothyroxine  125 mcg Oral QAC breakfast  . loratadine  10 mg Oral Daily  . methylPREDNISolone (SOLU-MEDROL) injection  500 mg Intravenous BID  . mometasone-formoterol  2 puff Inhalation BID  . multivitamin with minerals  1 tablet Oral Daily  . nebivolol  10 mg Oral Daily  . sodium chloride  3 mL Intravenous Q12H    Assessment/ Plan:   Principal Problem:   Cough with hemoptysis Active Problems:   HYPOTHYROIDISM   HYPERLIPIDEMIA   HYPONATREMIA   HYPERTENSION   Type  2 diabetes mellitus   Atrial fibrillation   Anemia   Dehydration   Acute renal failure   Diffuse pulmonary alveolar hemorrhage   Wegener's granulomatosis (granulomatosis with polyangiitis)   Abnormal echocardiogram  Pt is being evaluated and treated for wegner's disease.  OK to hold anticoagulation for now.    Further plans per PCCM.    Disposition:  Length of Stay: 2  Ramond Dial., MD, [facility] 11/03/2012, 7:49 AM Office [phone] Pager [phone]

## 2012-11-03 NOTE — Progress Notes (Signed)
3p-7p shift summary: [name] [name] received her first dose of Rituxan this afternoon. It was infused at 58m/hr. She was closely observed and monitored for side effects. Afebrile. Hemodynamics stable. Offered no complaints.

## 2012-11-03 NOTE — Progress Notes (Signed)
Educated patient on side effects and symptom management for Rituximab. Patient able to participate in "Teach Back" and has a good understanding of the side effects.  Patient signed consent to receive Rituximab.

## 2012-11-04 ENCOUNTER — Inpatient Hospital Stay (HOSPITAL_COMMUNITY): Payer: Medicare Other

## 2012-11-04 LAB — URINE CULTURE: Culture: NO GROWTH

## 2012-11-04 LAB — BASIC METABOLIC PANEL
BUN: 46 mg/dL — ABNORMAL HIGH (ref 6–23)
CO2: 20 mEq/L (ref 19–32)
CO2: 21 mEq/L (ref 19–32)
Chloride: 91 mEq/L — ABNORMAL LOW (ref 96–112)
Chloride: 90 mEq/L — ABNORMAL LOW (ref 96–112)
Creatinine, Ser: 2.25 mg/dL — ABNORMAL HIGH (ref 0.50–1.10)
GFR calc Af Amer: 23 mL/min — ABNORMAL LOW (ref >90)
GFR calc Af Amer: 22 mL/min — ABNORMAL LOW (ref >90)
Glucose, Bld: 306 mg/dL — ABNORMAL HIGH (ref 70–99)
Potassium: 4.9 mEq/L (ref 3.5–5.1)
Potassium: 4.2 mEq/L (ref 3.5–5.1)

## 2012-11-04 LAB — CBC WITH DIFFERENTIAL/PLATELET
Basophils Relative: 0 % (ref 0–1)
Eosinophils Relative: 0 % (ref 0–5)
HCT: 25.6 % — ABNORMAL LOW (ref 36.0–46.0)
Hemoglobin: 8.5 g/dL — ABNORMAL LOW (ref 12.0–15.0)
MCHC: 33.2 g/dL (ref 30.0–36.0)
MCV: 91.8 fL (ref 78.0–100.0)
Monocytes Absolute: 0.4 10*3/uL (ref 0.1–1.0)
Monocytes Relative: 3 % (ref 3–12)
Neutro Abs: 9.4 10*3/uL — ABNORMAL HIGH (ref 1.7–7.7)

## 2012-11-04 LAB — GLUCOSE, CAPILLARY
Glucose-Capillary: 317 mg/dL — ABNORMAL HIGH (ref 70–99)
Glucose-Capillary: 309 mg/dL — ABNORMAL HIGH (ref 70–99)
Glucose-Capillary: 380 mg/dL — ABNORMAL HIGH (ref 70–99)
Glucose-Capillary: 365 mg/dL — ABNORMAL HIGH (ref 70–99)

## 2012-11-04 MED ORDER — INSULIN GLARGINE 100 UNIT/ML ~~LOC~~ SOLN
15.0000 [IU] | Freq: Every day | SUBCUTANEOUS | Status: DC
  Administered 2012-11-04 – 2012-11-05 (×2): 15 [IU] via SUBCUTANEOUS
  Filled 2012-11-04 (×2): qty 0.15

## 2012-11-04 MED ORDER — BISACODYL 5 MG PO TBEC
10.0000 mg | DELAYED_RELEASE_TABLET | Freq: Once | ORAL | Status: AC
Start: 2012-11-04 — End: 2012-11-04
  Administered 2012-11-04: 10 mg via ORAL
  Filled 2012-11-04: qty 2

## 2012-11-04 NOTE — Progress Notes (Signed)
PROGRESS NOTE  Subjective:    Pt is a 79 yo with hyx of breast CA, atrial fib , pulmonary HTN admitted with hemoptysis.  Anticoagulation has been held.           Vital Signs:   Temp:  [97.4 F (36.3 C)-97.9 F (36.6 C)] 97.5 F (36.4 C) (07/31 0400) Pulse Rate:  [73-91] 91 (07/30 1537) Resp:  [16-29] 18 (07/31 0600) BP: (99-153)/(57-85) 144/76 mmHg (07/31 0600) SpO2:  [79 %-100 %] 100 % (07/31 0600)  Last BM Date: 10/31/12   24-hour weight change: Weight change:   Weight trends: Filed Weights   11/02/12 0502 11/02/12 2000 11/03/12 0400  Weight: 202 lb 9.6 oz (91.9 kg) 201 lb 1 oz (91.2 kg) 200 lb 9.9 oz (91 kg)    Intake/Output:  07/30 0701 - 07/31 0700 In: 2146.3 [P.O.:1010; I.V.:623.3; IV Piggyback:188] Out: 1895 [Urine:1895]     Physical Exam: BP 144/76  Pulse 91  Temp(Src) 97.5 F (36.4 C) (Oral)  Resp 18  Ht _0  (1.702 m)  Wt 200 lb 9.9 oz (91 kg)  BMI 31.41 kg/m2  SpO2 100%  General: Vital signs reviewed and noted.   Head: Normocephalic, atraumatic.  Eyes: conjunctivae/corneas clear.  EOM's intact.   Throat: normal  Neck:  normal  Lungs:    bilateral rales  Heart:  Irreg. Irreg. 2/6 systolic murmur  Abdomen:  Soft, non-tender, non-distended    Extremities: No edema   Neurologic: A&O X3, CN II - XII are grossly intact.   Psych: Normal     Labs: BMET:  Recent Labs  11/03/12 0310 11/04/12 0300  NA 124* 123*  K 5.5* 4.9  CL 93* 91*  CO2 22 20  GLUCOSE 275* 306*  BUN 31* 46*  CREATININE 2.11* 2.25*  CALCIUM 8.5 8.5    Liver function tests:  Recent Labs  11/01/12 0825 11/03/12 0310  AST 21 21  ALT 11 12  ALKPHOS 72 93  BILITOT 1.0 1.1  PROT 7.6 7.3  ALBUMIN 3.0* 2.7*   No results found for this basename: LIPASE, AMYLASE,  in the last 72 hours  CBC:  Recent Labs  11/01/12 0825  11/03/12 0310 11/04/12 0300  WBC 9.9  < > 8.4 10.3  NEUTROABS 6.9  --   --  9.4*  HGB 7.9*  < > 8.1* 8.5*  HCT 23.8*  < > 24.6*  25.6*  MCV 93.0  < > 93.2 91.8  PLT 268  < > 257 292  < > = values in this interval not displayed.  Cardiac Enzymes:  Recent Labs  11/01/12 0825 11/01/12 1140  CKTOTAL  --  90  TROPONINI <0.30  --     Coagulation Studies:  Recent Labs  11/01/12 0825  LABPROT 21.7*  INR 1.96*    Other results:  Tele:  Atrial fib with controlled rate.   Medications:    Infusions:    Scheduled Medications: . amLODipine  5 mg Oral Daily  . atorvastatin  10 mg Oral q1800  . fluticasone  2 spray Each Nare Daily  . furosemide  80 mg Intravenous Q8H  . gabapentin  100 mg Oral BID AC  . heparin subcutaneous  5,000 Units Subcutaneous Q12H  . insulin aspart  0-5 Units Subcutaneous QHS  . insulin aspart  0-9 Units Subcutaneous TID WC  . levothyroxine  125 mcg Oral QAC breakfast  . methylPREDNISolone (SOLU-MEDROL) injection  500 mg Intravenous BID  . mometasone-formoterol  2 puff Inhalation BID  .  multivitamin with minerals  1 tablet Oral Daily  . nebivolol  10 mg Oral Daily  . sodium chloride  3 mL Intravenous Q12H  . sulfamethoxazole-trimethoprim  1 tablet Oral 3 times weekly    Assessment/ Plan:   Principal Problem:   Cough with hemoptysis Active Problems:   HYPOTHYROIDISM   HYPERLIPIDEMIA   HYPONATREMIA   HYPERTENSION   Type 2 diabetes mellitus   Atrial fibrillation   Anemia   Dehydration   Acute renal failure   Diffuse pulmonary alveolar hemorrhage   Wegener's granulomatosis (granulomatosis with polyangiitis)   Abnormal echocardiogram  Pt is being evaluated and treated for wegner's disease.  OK to hold anticoagulation for now.    Further plans per PCCM.   She is stable from a cardiac standpoint.  She should be restarted on her eliquis when OK with pulmonary . Will sign off.  Call for questions. She may return to see Dr. Martinique at her regularly scheduled apt.     Disposition:  Length of Stay: 3  Thayer Headings, Brooke Bonito., MD, [facility] 11/04/2012, 8:16 AM Office  [phone] Pager [phone]

## 2012-11-04 NOTE — Evaluation (Signed)
Physical Therapy Evaluation Patient Details Name: [name] [name] MRN: [id number] DOB: 07/20/1932 Today's Date: 11/04/2012 Time: 0017-4944 PT Time Calculation (min): 20 min  PT Assessment / Plan / Recommendation History of Present Illness  [name] [name] is a 79 y.o. female with history of hypothyroidism, hyperlipidemia, hypertension, hyponatremia, anemia, type II DM, CVA, atrial fibrillation on Eliquis, former smoker, breast cancer in remission, cryptogenic organizing pneumonia, presented to the [facility] ED on 11/01/12 with 3 weeks history of worsening cough with blood.   Clinical Impression  Pt did well with initial ambulation. She has some dyspnea on exertion and needed 2L of O2 to maintain sats> 90%.      PT Assessment  Patient needs continued PT services    Follow Up Recommendations  Home health PT    Does the patient have the potential to tolerate intense rehabilitation      Barriers to Discharge        Equipment Recommendations  None recommended by PT    Recommendations for Other Services OT consult   Frequency Min 3X/week    Precautions / Restrictions Precautions Precaution Comments: pt reports she has had vertigo from several years Restrictions Weight Bearing Restrictions: No   Pertinent Vitals/Pain No c.o pain or vertigo      Mobility  Bed Mobility Bed Mobility: Supine to Sit Supine to Sit: 4: Min assist Transfers Transfers: Sit to Stand;Stand to Sit Sit to Stand: 4: Min assist Stand to Sit: 4: Min assist Ambulation/Gait Ambulation/Gait Assistance: 4: Min Wellsite geologist (Feet): 50 Feet Assistive device: Rolling walker Ambulation/Gait Assistance Details: Pt needs min assist for safety  and another person to bring O2 tank Gait Pattern: Step-through pattern;Decreased step length - right;Decreased step length - left Gait velocity: decreased  General Gait Details: Pt with some dyspnea, but O2 sats remained at 91% on 2L Stairs:  No Wheelchair Mobility Wheelchair Mobility: No    Exercises     PT Diagnosis: Difficulty walking;Generalized weakness  PT Problem List: Decreased strength;Decreased activity tolerance;Decreased mobility PT Treatment Interventions: Gait training;Stair training;Functional mobility training;Therapeutic activities;Patient/family education     PT Goals(Current goals can be found in the care plan section) Acute Rehab PT Goals Patient Stated Goal: to go home PT Goal Formulation: With patient Time For Goal Achievement: 11/18/12 Potential to Achieve Goals: Good  Visit Information  Last PT Received On: 11/04/12 Assistance Needed: +2 History of Present Illness: [name] [name] is a 79 y.o. female with history of hypothyroidism, hyperlipidemia, hypertension, hyponatremia, anemia, type II DM, CVA, atrial fibrillation on Eliquis, former smoker, breast cancer in remission, cryptogenic organizing pneumonia, presented to the [facility] ED on 11/01/12 with 3 weeks history of worsening cough with blood.        Prior [place] expects to be discharged to:: Private residence Living Arrangements: Spouse/significant other Available Help at Discharge: Family Type of Home: House Home Layout: One [place]: Environmental consultant - 2 wheels;Bedside commode;Cane - single point Prior Function Level of Independence: Independent with assistive device(s) Comments: Pt says she used a straight cane and held onto furntiure Communication Communication: No difficulties    Cognition  Cognition Arousal/Alertness: Awake/alert Overall Cognitive Status: Within Functional Limits for tasks assessed    Extremity/Trunk Assessment Lower Extremity Assessment Lower Extremity Assessment: Overall WFL for tasks assessed;Generalized weakness Cervical / Trunk Assessment Cervical / Trunk Assessment: Normal   Balance Balance Balance Assessed: Yes Static Sitting Balance Static Sitting -  Balance Support: Feet supported;No upper  extremity supported Static Sitting - Level of Assistance: 7: Independent Static Standing Balance Static Standing - Balance Support: Bilateral upper extremity supported Static Standing - Level of Assistance: 6: Modified independent (Device/Increase time)  End of Session PT - End of Session Equipment Utilized During Treatment: Oxygen Activity Tolerance: Patient tolerated treatment well Patient left: in chair;with call bell/phone within reach Nurse Communication: Mobility status  GP   Donato Heinz. [place], [place] 11/04/2012, 4:57 PM

## 2012-11-04 NOTE — Progress Notes (Signed)
[id number] Rosana Hoes RN, BSN, CCN: [phone] Case management. Chart reviewed for discharge planning and present needs. Discharge needs: none present at time of review. Next chart review due:  [id number]

## 2012-11-04 NOTE — Progress Notes (Signed)
Inpatient Diabetes Program Recommendations  AACE/ADA: New Consensus Statement on Inpatient Glycemic Control (2013)  Target Ranges:  Prepandial:   less than 140 mg/dL      Peak postprandial:   less than 180 mg/dL (1-2 hours)      Critically ill patients:  140 - 180 mg/dL  Results for [name] [name], [name] [name] (MRN [id number]) as of 11/04/2012 10:04  Ref. Range 11/03/2012 08:35 11/03/2012 12:28 11/03/2012 17:29 11/03/2012 22:05 11/04/2012 07:45  Glucose-Capillary Latest Range: 70-99 mg/dL 257 (H) 312 (H) 299 (H) 317 (H) 309 (H)   Inpatient Diabetes Program Recommendations Correction (SSI): consider increasing correction scale to resistant during steroid therapy Thank you  Raoul Pitch BSN, RN,CDE Inpatient Diabetes Coordinator [phone] (team pager)

## 2012-11-04 NOTE — Progress Notes (Signed)
PULMONARY  / CRITICAL CARE MEDICINE  Name: [name] [name] MRN: [id number] DOB: 1932/07/02    ADMISSION DATE:  11/01/2012 CONSULTATION DATE:  11/01/2012    REFERRING MD :  Dr Algis Liming PRIMARY SERVICE: [facility]  CHIEF COMPLAINT:  Hemoptysis, ? alv hge  HPI: 79 year old former smoker with breast cancer 2007 in complete remission. Currently presented with hemoptysis with CT scan chest findings of alveolar hemorrhage and pulmonary critical care medicine consulted.  She's noticed to be on direct thrombin inhibitor for atrial  fibrillation  Background pulmonary history dates back to December 2012 when she had symptoms of polymyalgia rheumatica with the ESR 120 but autoimmune antibody was positive for c-ANCA at 1:320. Radiological imaging showed diffuse bilateral groundglass opacities. She at that time had clinical response to prednisone.  surgical lung biopsy in January 2013 of the left side showed organizing pneumonia along with focal atypical glands (per Dr Annamaria Boots this an isolated focal finding only) but was diagnosed as low-grade new endocrine malignancy [subsequent 5-HIAA 24 hour urine test was normal]. I am not exactly sure whether she was treated with prednisone subsequently and if so for how long. She denies or does not recollect being on prolonged prednisone but certainly by June 4098 a CT scan of the chest showed clearance and she is being clinically monitored. A followup CT scan of the chest in December 2013 continue to show clear lung fields except for a new left upper lobe 1 cm groundglass opacity but this too on followup in April 2014 showed improvement.  As of June 2014 the oncologist did not feel there was any evidence of breast cancer recurrence  Currently 10/24/2012 she is admitted for 2-3 week history of cough with mild hemoptysis and associated nausea. Over the last few days she's having worsening symptoms. Hemoptysis is not massive but is constant and small amounts. She presented to the  emergency department after failing sinus treatment. CT scan of the chest ruled out pulmonary embolism but showed severe bilateral groundglass opacities consistent with alveolar hemorrhage pattern.  Pulmonary consultation is being therefore called       CULTURES: Results for orders placed during the hospital encounter of 11/01/12  URINE CULTURE     Status: None   Collection Time    11/02/12  3:40 PM      Result Value Range Status   Specimen Description URINE, CLEAN CATCH   Final   Special Requests NONE   Final   Culture  Setup Time 11/03/2012 00:41   Final   Colony Count NO GROWTH   Final   Culture NO GROWTH   Final   Report Status 11/03/2012 FINAL   Final  MRSA PCR SCREENING     Status: None   Collection Time    11/02/12  7:27 PM      Result Value Range Status   MRSA by PCR NEGATIVE  NEGATIVE Final   Comment:            The GeneXpert MRSA Assay (FDA     approved for NASAL specimens     only), is one component of a     comprehensive MRSA colonization     surveillance program. It is not     intended to diagnose MRSA     infection nor to guide or     monitor treatment for     MRSA infections.    ANTIBIOTICS: Anti-infectives   Start     Dose/Rate Route Frequency Ordered Stop   11/03/12 1200  sulfamethoxazole-trimethoprim (BACTRIM DS) 800-160 MG per tablet 1 tablet     1 tablet Oral Once per day on Mon Wed Fri 11/03/12 1024     11/02/12 1000  azithromycin (ZITHROMAX) 500 mg in dextrose 5 % 250 mL IVPB  Status:  Discontinued     500 mg 250 mL/hr over 60 Minutes Intravenous Every 24 hours 11/01/12 1128 11/03/12 1025   11/01/12 1200  cefTRIAXone (ROCEPHIN) 1 g in dextrose 5 % 50 mL IVPB  Status:  Discontinued     1 g 100 mL/hr over 30 Minutes Intravenous Every 24 hours 11/01/12 1128 11/03/12 1025   11/01/12 1030  cefTRIAXone (ROCEPHIN) 1 g in dextrose 5 % 50 mL IVPB  Status:  Discontinued     1 g 100 mL/hr over 30 Minutes Intravenous  Once 11/01/12 1017 11/01/12 1128    11/01/12 1030  azithromycin (ZITHROMAX) 500 mg in dextrose 5 % 250 mL IVPB     500 mg 250 mL/hr over 60 Minutes Intravenous  Once 11/01/12 1017 11/01/12 1141        SIGNIFICANT EVENTS / STUDIES:  11/01/2012 - admit  11/02/12: GPA diagnoseed (PR-3 880)  STart 3 days solumedrold 562m bid x 3 days/6 doses New onset renal failur - renal consult    SUBJECTIVE/OVERNIGHT/INTERVAL HX 11/03/12: improved dyspnea but worsening creat. She thhink she is in renal failure due to drinking diet soda and not enough water.  STarted solumedrold yesterday 'nausea -improved with zofran & reglan C/o constipation Improved Uo with lasix    VITAL SIGNS: Temp:  [97.4 F (36.3 C)-97.9 F (36.6 C)] 97.4 F (36.3 C) (07/31 0800) Pulse Rate:  [73-91] 91 (07/30 1537) Resp:  [16-29] 18 (07/31 0600) BP: (99-153)/(57-85) 144/76 mmHg (07/31 0600) SpO2:  [79 %-100 %] 98 % (07/31 0921) HEMODYNAMICS:   VENTILATOR SETTINGS:   INTAKE / OUTPUT: Intake/Output     07/30 0701 - 07/31 0700 07/31 0701 - 08/01 0700   P.O. 1010    I.V. (mL/kg) 623.3 (6.8)    Blood     Other 325    IV Piggyback 188    Total Intake(mL/kg) 2146.3 (23.6)    Urine (mL/kg/hr) 1895 (0.9)    Total Output 1895     Net +251.3            PHYSICAL EXAMINATION: General:  Obese female no distress seated comfortably in the bed Neuro:  Alert and oriented x3, speech normal, moves all 4 extremities. CAM-ICU negative for delirium HEENT:  Pupils equal and reactive to light. No cough. Neck is supple no neck nodes Cardiovascular:  Irregularly irregular but not tachycardic. No murmurs Lungs:  No respiratory distress but bilateral crackles present but improved on 11/03/12 compared to 11/01/12 Abdomen:  Obese, soft, nontender, no organomegaly, no tenderness Musculoskeletal:  No cyanosis, no clubbing, no pedal edema Skin:  Intact without any rashes  LABS: PULMONARY No results found for this basename: PHART, PCO2, PCO2ART, PO2, PO2ART, HCO3, TCO2,  O2SAT,  in the last 168 hours  CBC  Recent Labs Lab 11/02/12 0430 11/02/12 1805 11/03/12 0310 11/04/12 0300  HGB 7.0* 7.9* 8.1* 8.5*  HCT 21.4* 24.6* 24.6* 25.6*  WBC 8.8  --  8.4 10.3  PLT 228  --  257 292    COAGULATION  Recent Labs Lab 11/01/12 0825  INR 1.96*    CARDIAC    Recent Labs Lab 11/01/12 0825  TROPONINI <0.30    Recent Labs Lab 11/04/12 0300  PROBNP 22072.0*  CHEMISTRY  Recent Labs Lab 11/01/12 0825 11/02/12 0430 11/03/12 0310 11/04/12 0300  NA 125* 129* 124* 123*  K 4.2 4.6 5.5* 4.9  CL 92* 97 93* 91*  CO2 _0 GLUCOSE 180* 149* 275* 306*  BUN 19 21 31* 46*  CREATININE 1.46* 1.71* 2.11* 2.25*  CALCIUM 8.7 8.5 8.5 8.5   Estimated Creatinine Clearance: 23.9 ml/min (by C-G formula based on Cr of 2.25).  URINE ANALYSIS  11/02/12: UA 11-20 RBC - microscopuic hematuria   LIVER  Recent Labs Lab 11/01/12 0825 11/03/12 0310  AST 21 21  ALT 11 12  ALKPHOS 72 93  BILITOT 1.0 1.1  PROT 7.6 7.3  ALBUMIN 3.0* 2.7*  INR 1.96*  --      INFECTIOUS  Recent Labs Lab 11/01/12 1140 11/03/12 0316  LATICACIDVEN 1.7  --   PROCALCITON <0.10 0.15     ENDOCRINE CBG (last 3)   Recent Labs  11/03/12 1729 11/03/12 2205 11/04/12 0745  GLUCAP 299* 317* 309*         IMAGING x48h  Dg Chest Port 1 View  11/04/2012   *RADIOLOGY REPORT*  Clinical Data: Bilateral perihilar air space disease.  History of breast cancer.  PORTABLE CHEST - 1 VIEW  Comparison: Chest x-ray 2 days prior.  Findings: Stable appearance of cardiopericardial enlargement. Aortic tortuosity as before.  Kerley B lines and fissural thickening.  There are additionally patchy bilateral lung opacities, most notable in the upper lungs, similar to prior.  No significant effusion.  No pneumothorax.  Negative osseous structures.  IMPRESSION:  1.  Stable appearance of the chest compared to 2 days prior.  There is pulmonary interstitial edema and bilateral  consolidation.  2.  Unchanged cardiopericardial enlargement in this patient with cardiomegaly and pericardial effusion.   Original Report Authenticated By: Jorje Guild   Dg Chest Port 1 View  11/02/2012   *RADIOLOGY REPORT*  Clinical Data: Shortness of breath  PORTABLE CHEST - 1 VIEW  Comparison: 11/01/2012  Findings: Patchy infiltrates are again identified throughout both lungs.  Given some technical variations of the film the overall appearance is stable.  The cardiac shadow remains enlarged.  No new focal abnormality is seen.  IMPRESSION: No change from prior exam   Original Report Authenticated By: Inez Catalina, M.D.       ASSESSMENT / PLAN:  PULMONARY A: GPA with ALveolar Hemorrhage. RElapse since 2013/2013. 11/02/12 PR-3 880. MPO negative. - GBM neg Likely - capillaritis. C/w Granulomatous Polyangitis  (former Wegener).  Sepsis markers negative  11/02/12: started pulsed steroids   P:   - - START INDUCTION THERAPY with 2 drugs for SEVERE DISEASE   - Drug #1 - STEROIDS Pulse methylprednisolone   587m bid x 6 doses starting PM of 11/02/12, Then Oral glucocorticoid therapy, consists prednisone of 1 mg/kg per day (maximum of 60 to 80 mg/day) of oral prednisone (or its equivalent) for two to four weeks.    - Drug #2 RITUXAN - Induction therapy with rituximab (375 mg/m2 per week for four weeks); first dose 11/03/12   -  PCP prophylaxis - BActrim 1 DS on M, W, F (She says she has nasuea and vomit seeveral decades ago with bactrim when taken with sudafed. No rash. No anemia. She is willing to try it again )   CARDIOVASCULAR A: A Fib. Rate controlled P:  Cards following, once GPA improved and hemoptysis better ok to start anticoagulation; with renal failure , eliquis contra-indicated-will need coumadin, hold  off until clear that renal biopsy not desired  RENAL A:  Microscopic Hematuria with new onset ATN 11/02/12 that is rapidly worse 11/03/12 P:   See pulm section If no improvement or  getting worse, move to [facility] for renal bx vs PLEX Renal following Dc IVfs & lasix given  GASTROINTESTINAL A:  Nil acute P:   Monitor Regular diet Await HEp Virus panel 11/03/12 in setting of Rituxan   HEMATOLOGIC A:  Anemia due to alveolar hemorrhage P:  Will start heparin sq bid 11/03/12 and monitor   INFECTIOUS A:  No evidence of infection P:   Check urine cutlure  ENDOCRINE A:  diabetes   P:   ssi  NEUROLOGIC A:  intact P:   monitor  TODAY'S SUMMARY: Critically ill GPA. Started rituxan & pulse steroids. Extensively described diagnosis, course, prognosis, side effects to sons x 2 and patient and husband. If worse, move to [facility] for PLEX  I have personally obtained a history, examined the patient, evaluated laboratory and imaging results, formulated the assessment and plan and placed orders.   CRITICAL CARE: The patient is critically ill with multiple organ systems failure and requires high complexity decision making for assessment and support, frequent evaluation and titration of therapies, application of advanced monitoring technologies and extensive interpretation of multiple databases. Critical Care Time devoted to patient care services described in this note is 31 minutes.   Madolin Twaddle V.  11/04/2012 10:00 AM

## 2012-11-04 NOTE — Progress Notes (Signed)
Subjective: Moved to ICU/SDU,   Physical Exam:  Blood pressure 119/53, pulse 91, temperature 97.5 F (36.4 C), temperature source Oral, resp. rate 27, height 5' 7" (1.702 m), weight 91 kg (200 lb 9.9 oz), SpO2 98.00%. Gen: alert, not dyspneic Skin: no rash, cyanosis Neck: + JVD 12 cm, no LAN  Chest: bilat insp rales 2/3 up on R and 1/2 up on L CV: regular, blowing 2/6 syst M at apex Abdomen: soft, nontender, liver down 3cm Ext: no LE edema, no joint effusion, no gangrene/ulcers  Neuro: alert, Ox3, nonfocal   UA- 100 prot, 11-20 rbcs  Impression/Recs  1. Acute kidney injury / ANCA+ / pulm hemorrhage - getting bolus steroids and s/p 1 dose Rituximab, check creat in am, renal fxn may be stabilizing 2. SOB- pulm hemorrhage + suspect pulm edema, diuresing somewhat on IV lasix, will cont 3. Hyponatremia- from vol overload 4. Hyperkalemia- change to renal diet, down w diuresis 5. HTN- stop norvasc, cont BB for afib/htn   6. Hx breast Ca 7. DM2 8. Hx Thedora Hinders  MD Pager [phone]    Cell  [phone] 11/04/2012, 4:33 PM   Recent Labs Lab 11/03/12 0310 11/04/12 0300 11/04/12 1243  NA 124* 123* 125*  K 5.5* 4.9 4.2  CL 93* 91* 90*  CO2 _0 GLUCOSE 275* 306* 364*  BUN 31* 46* 51*  CREATININE 2.11* 2.25* 2.34*  CALCIUM 8.5 8.5 8.5    Recent Labs Lab 11/01/12 0825 11/02/12 0430 11/02/12 1805 11/03/12 0310 11/04/12 0300  WBC 9.9 8.8  --  8.4 10.3  NEUTROABS 6.9  --   --   --  9.4*  HGB 7.9* 7.0* 7.9* 8.1* 8.5*  HCT 23.8* 21.4* 24.6* 24.6* 25.6*  MCV 93.0 93.9  --  93.2 91.8  PLT 268 228  --  257 292

## 2012-11-04 NOTE — Progress Notes (Signed)
eLink Physician-Brief Progress Note Patient Name: [name] [name] DOB: 1932/12/13 MRN: [id number]  Date of Service  11/04/2012   HPI/Events of Note   CBG s high, on pulse steroids  eICU Interventions  Add 15 U lantus   Intervention Category Intermediate Interventions: Hyperglycemia - evaluation and treatment  [name] V. 11/04/2012, 4:08 PM

## 2012-11-04 NOTE — Progress Notes (Signed)
Stopped to visit with patient today. She was resting and said she did not want a visit, but welcomed a visit later when she feels better.

## 2012-11-04 NOTE — Progress Notes (Deleted)
PULMONARY  / CRITICAL CARE MEDICINE  Name: [name] [name] MRN: [id number] DOB: 08/29/1932    ADMISSION DATE:  11/01/2012 CONSULTATION DATE:  11/01/2012    REFERRING MD :  Dr Algis Liming [facility]IMARY SERVICE: [facility]  CHIEF COMPLAINT:  Hemoptysis, ? alv hge  HPI: 79 year old former smoker with breast cancer 2007 in complete remission. Currently presented with hemoptysis with CT scan chest findings of alveolar hemorrhage and pulmonary critical care medicine consulted.    Background pulmonary history dates back to December 2012 when she had symptoms of polymyalgia rheumatica with the ESR 120 but autoimmune antibody was positive for c-ANCA at 1:320. Radiological imaging showed diffuse bilateral groundglass opacities. She at that time had clinical response to prednisone.  surgical lung biopsy in January 2013 of the left side showed organizing pneumonia along with focal atypical glands (per Dr Annamaria Boots this an isolated focal finding only) but was diagnosed as low-grade new endocrine malignancy [subsequent 5-HIAA 24 hour urine test was normal]. I am not exactly sure whether she was treated with prednisone subsequently and if so for how long. She denies or does not recollect being on prolonged prednisone but certainly by June 8469 a CT scan of the chest showed clearance and she is being clinically monitored. A followup CT scan of the chest in December 2013 continue to show clear lung fields except for a new left upper lobe 1 cm groundglass opacity but this too on followup in April 2014 showed improvement.  As of June 2014 the oncologist did not feel there was any evidence of breast cancer recurrence  Currently 10/24/2012 she is admitted for 2-3 week history of cough with mild hemoptysis and associated nausea. Over the last few days she's having worsening symptoms. Hemoptysis is not massive but is constant and small amounts. She presented to the emergency department after failing sinus treatment. CT scan of the chest  ruled out pulmonary embolism but showed severe bilateral groundglass opacities consistent with alveolar hemorrhage pattern. Pulmonary consultation is being therefore called  CULTURES: UC 7/29: neg   ANTIBIOTICS: azith 7/28>>>7/30 Rocephin 7/28>>>7/30 Bactrim 7/30 (prophylaxis)>>>    SIGNIFICANT EVENTS / STUDIES:   11/01/2012 - admit  11/02/12: GPA diagnoseed (PR-3 880) STart 3 days solumedrold 531m bid x 3 days/6 doses New onset renal failure - renal consult   SUBJECTIVE/OVERNIGHT/INTERVAL HX 11/04/12: improved dyspnea but worsening creat.  VITAL SIGNS: Temp:  [97.4 F (36.3 C)-97.9 F (36.6 C)] 97.4 F (36.3 C) (07/31 0800) Pulse Rate:  [73-91] 91 (07/30 1537) Resp:  [16-29] 18 (07/31 0600) BP: (99-153)/(57-85) 144/76 mmHg (07/31 0600) SpO2:  [79 %-100 %] 100 % (07/31 0600) 3 liters  HEMODYNAMICS:   VENTILATOR SETTINGS:   INTAKE / OUTPUT: Intake/Output     07/30 0701 - 07/31 0700 07/31 0701 - 08/01 0700   P.O. 1010    I.V. (mL/kg) 623.3 (6.8)    Blood     Other 325    IV Piggyback 188    Total Intake(mL/kg) 2146.3 (23.6)    Urine (mL/kg/hr) 1895 (0.9)    Total Output 1895     Net +251.3            PHYSICAL EXAMINATION: General:  Obese female no distress seated comfortably in the bed Neuro:  Alert and oriented x3, speech normal, moves all 4 extremities. CAM-ICU negative for delirium HEENT:  Pupils equal and reactive to light. No cough. Neck is supple no neck nodes Cardiovascular:  Irregularly irregular but not tachycardic. No murmurs Lungs:  No respiratory distress  but bilateral crackles present but improved on 11/03/12 compared to 11/01/12 Abdomen:  Obese, soft, nontender, no organomegaly, no tenderness Musculoskeletal:  No cyanosis, no clubbing, no pedal edema Skin:  Intact without any rashes  LABS: PULMONARY No results found for this basename: PHART, PCO2, PCO2ART, PO2, PO2ART, HCO3, TCO2, O2SAT,  in the last 168 hours  CBC  Recent Labs Lab  11/02/12 0430 11/02/12 1805 11/03/12 0310 11/04/12 0300  HGB 7.0* 7.9* 8.1* 8.5*  HCT 21.4* 24.6* 24.6* 25.6*  WBC 8.8  --  8.4 10.3  PLT 228  --  257 292    COAGULATION  Recent Labs Lab 11/01/12 0825  INR 1.96*    CARDIAC    Recent Labs Lab 11/01/12 0825  TROPONINI <0.30    Recent Labs Lab 11/04/12 0300  PROBNP 22072.0*   CHEMISTRY  Recent Labs Lab 11/01/12 0825 11/02/12 0430 11/03/12 0310 11/04/12 0300  NA 125* 129* 124* 123*  K 4.2 4.6 5.5* 4.9  CL 92* 97 93* 91*  CO2 _0 GLUCOSE 180* 149* 275* 306*  BUN 19 21 31* 46*  CREATININE 1.46* 1.71* 2.11* 2.25*  CALCIUM 8.7 8.5 8.5 8.5   Estimated Creatinine Clearance: 23.9 ml/min (by C-G formula based on Cr of 2.25).  URINE ANALYSIS  11/02/12: UA 11-20 RBC - microscopuic hematuria   LIVER  Recent Labs Lab 11/01/12 0825 11/03/12 0310  AST 21 21  ALT 11 12  ALKPHOS 72 93  BILITOT 1.0 1.1  PROT 7.6 7.3  ALBUMIN 3.0* 2.7*  INR 1.96*  --      INFECTIOUS  Recent Labs Lab 11/01/12 1140 11/03/12 0316  LATICACIDVEN 1.7  --   PROCALCITON <0.10 0.15     ENDOCRINE CBG (last 3)   Recent Labs  11/03/12 1729 11/03/12 2205 11/04/12 0745  GLUCAP 299* 317* 309*    IMAGING x48h  Dg Chest Port 1 View  11/04/2012   *RADIOLOGY REPORT*  Clinical Data: Bilateral perihilar air space disease.  History of breast cancer.  PORTABLE CHEST - 1 VIEW  Comparison: Chest x-ray 2 days prior.  Findings: Stable appearance of cardiopericardial enlargement. Aortic tortuosity as before.  Kerley B lines and fissural thickening.  There are additionally patchy bilateral lung opacities, most notable in the upper lungs, similar to prior.  No significant effusion.  No pneumothorax.  Negative osseous structures.  IMPRESSION:  1.  Stable appearance of the chest compared to 2 days prior.  There is pulmonary interstitial edema and bilateral consolidation.  2.  Unchanged cardiopericardial enlargement in this patient  with cardiomegaly and pericardial effusion.   Original Report Authenticated By: Jorje Guild   Dg Chest Port 1 View  11/02/2012   *RADIOLOGY REPORT*  Clinical Data: Shortness of breath  PORTABLE CHEST - 1 VIEW  Comparison: 11/01/2012  Findings: Patchy infiltrates are again identified throughout both lungs.  Given some technical variations of the film the overall appearance is stable.  The cardiac shadow remains enlarged.  No new focal abnormality is seen.  IMPRESSION: No change from prior exam   Original Report Authenticated By: Inez Catalina, M.D.  not much change in CXR.   ASSESSMENT / PLAN:  PULMONARY A: GPA with ALveolar Hemorrhage. Relapse since 2013/2013. 11/02/12 PR-3 880. MPO negative. Likely - capillaritis. C/w Granulomatous Polyangitis  (former Medical illustrator).  Sepsis markers negative 11/02/12: started pulsed steroids 11/03/12: Improved lung exam and stable hgb 7/31: no sig change in pulm exam.  GMB is negative  P:  - STARTED INDUCTION  THERAPY on 7/29 with 2 drugs for SEVERE DISEASE   - Drug #1 - STEROIDS Pulse methylprednisolone   576m bid x 6 doses starting PM of 11/02/12, Then Oral glucocorticoid therapy, consists prednisone of 1 mg/kg per day (maximum of 60 to 80 mg/day) of oral prednisone (or its equivalent) for two to four weeks.   - Drug #2 RITUXAN - Induction therapy with rituximab (375 mg/m2 per week for four weeks); first dose 11/03/12 -  PCP prophylaxis - BActrim 1 DS on M, W, F (She says she has nasuea and vomit several decades ago with bactrim when taken with sudafed. No rash. No anemia. She is willing to try it again )   CARDIOVASCULAR A: A Fib. Rate controlled P:  Cards following, once GPA improved and hemoptysis better ok to start anticoagulation; with renal failure ? [place] Would like to see some evidence of improvement before we do this  RENAL A:  Microscopic Hematuria with new onset ATN 11/02/12. Her [place] continues to rise. Nephrology is following. I&O balance  remains positive.  P:   See pulm section Seems to be getting worse, ? [facility]x vs [facility], will recheck chem later this am if still rising will go ahead w/ prophylactic transfer   GASTROINTESTINAL A:  Nil acute P:   Monitor Regular diet Await HEp Virus panel 11/03/12 in setting of Rituxan   HEMATOLOGIC A:  Anemia due to alveolar hemorrhage P:  [place] heparin, trend cbc    INFECTIOUS A:  No evidence of infection P:   Trend cbc & fever curve   ENDOCRINE A:  diabetes   P:   ssi  NEUROLOGIC A:  intact P:   monitor  TODAY'S SUMMARY: Critically ill GPA. Starting rituxan. Extensively described diagnosis, course, prognosis, side effects to sons x 2 and patient and husband. If worse, move [facility] for [facility]    11/04/2012 9:18 AM

## 2012-11-05 ENCOUNTER — Other Ambulatory Visit: Payer: Self-pay | Admitting: Oncology

## 2012-11-05 ENCOUNTER — Inpatient Hospital Stay (HOSPITAL_COMMUNITY): Payer: Medicare Other

## 2012-11-05 DIAGNOSIS — C50919 Malignant neoplasm of unspecified site of unspecified female breast: Secondary | ICD-10-CM

## 2012-11-05 LAB — GLUCOSE, CAPILLARY
Glucose-Capillary: 308 mg/dL — ABNORMAL HIGH (ref 70–99)
Glucose-Capillary: 318 mg/dL — ABNORMAL HIGH (ref 70–99)

## 2012-11-05 LAB — CBC WITH DIFFERENTIAL/PLATELET
Basophils Absolute: 0 10*3/uL (ref 0.0–0.1)
Eosinophils Relative: 0 % (ref 0–5)
Lymphocytes Relative: 6 % — ABNORMAL LOW (ref 12–46)
Neutro Abs: 6.4 10*3/uL (ref 1.7–7.7)
Neutrophils Relative %: 91 % — ABNORMAL HIGH (ref 43–77)
Platelets: 316 10*3/uL (ref 150–400)
RDW: 13.4 % (ref 11.5–15.5)
WBC: 7 10*3/uL (ref 4.0–10.5)

## 2012-11-05 LAB — OSMOLALITY, URINE: Osmolality, Ur: 318 mOsm/kg — ABNORMAL LOW (ref 390–1090)

## 2012-11-05 LAB — BASIC METABOLIC PANEL
Calcium: 8.7 mg/dL (ref 8.4–10.5)
Creatinine, Ser: 2.42 mg/dL — ABNORMAL HIGH (ref 0.50–1.10)
GFR calc Af Amer: 21 mL/min — ABNORMAL LOW (ref >90)
GFR calc non Af Amer: 18 mL/min — ABNORMAL LOW (ref >90)

## 2012-11-05 LAB — PROCALCITONIN: Procalcitonin: 0.15 ng/mL

## 2012-11-05 MED ORDER — PREDNISONE 50 MG PO TABS
60.0000 mg | ORAL_TABLET | Freq: Every day | ORAL | Status: DC
  Administered 2012-11-06 – 2012-11-08 (×3): 60 mg via ORAL
  Filled 2012-11-05 (×4): qty 1

## 2012-11-05 MED ORDER — INSULIN ASPART 100 UNIT/ML ~~LOC~~ SOLN
6.0000 [IU] | Freq: Three times a day (TID) | SUBCUTANEOUS | Status: DC
  Administered 2012-11-05 – 2012-11-08 (×7): 6 [IU] via SUBCUTANEOUS

## 2012-11-05 MED ORDER — INSULIN ASPART 100 UNIT/ML ~~LOC~~ SOLN
0.0000 [IU] | Freq: Three times a day (TID) | SUBCUTANEOUS | Status: DC
  Administered 2012-11-05: 15 [IU] via SUBCUTANEOUS
  Administered 2012-11-06: 11 [IU] via SUBCUTANEOUS
  Administered 2012-11-06: 20 [IU] via SUBCUTANEOUS
  Administered 2012-11-06: 11 [IU] via SUBCUTANEOUS
  Administered 2012-11-07: 4 [IU] via SUBCUTANEOUS
  Administered 2012-11-07: 7 [IU] via SUBCUTANEOUS
  Administered 2012-11-08: 4 [IU] via SUBCUTANEOUS

## 2012-11-05 MED ORDER — INSULIN ASPART 100 UNIT/ML ~~LOC~~ SOLN
0.0000 [IU] | Freq: Every day | SUBCUTANEOUS | Status: DC
  Administered 2012-11-05: 3 [IU] via SUBCUTANEOUS
  Administered 2012-11-06: 2 [IU] via SUBCUTANEOUS
  Administered 2012-11-07: 4 [IU] via SUBCUTANEOUS

## 2012-11-05 MED ORDER — POLYETHYLENE GLYCOL 3350 17 G PO PACK
17.0000 g | PACK | Freq: Every day | ORAL | Status: DC
  Administered 2012-11-05 – 2012-11-08 (×4): 17 g via ORAL
  Filled 2012-11-05 (×4): qty 1

## 2012-11-05 MED ORDER — SODIUM CHLORIDE 0.9 % IV SOLN
INTRAVENOUS | Status: DC
  Administered 2012-11-05 – 2012-11-07 (×4): via INTRAVENOUS

## 2012-11-05 MED ORDER — INSULIN GLARGINE 100 UNIT/ML ~~LOC~~ SOLN
20.0000 [IU] | Freq: Every day | SUBCUTANEOUS | Status: DC
  Administered 2012-11-06 – 2012-11-08 (×3): 20 [IU] via SUBCUTANEOUS
  Filled 2012-11-05 (×3): qty 0.2

## 2012-11-05 MED ORDER — INSULIN ASPART 100 UNIT/ML ~~LOC~~ SOLN
28.0000 [IU] | SUBCUTANEOUS | Status: AC
  Administered 2012-11-05: 28 [IU] via SUBCUTANEOUS

## 2012-11-05 NOTE — Progress Notes (Signed)
PULMONARY  / CRITICAL CARE MEDICINE  Name: [name] [name] MRN: [id number] DOB: 24-Jan-1933    ADMISSION DATE:  11/01/2012 CONSULTATION DATE:  11/01/2012    REFERRING MD :  Dr Algis Liming PRIMARY SERVICE: [facility]  CHIEF COMPLAINT:  Hemoptysis, ? alv hge  HPI:  79 year old former smoker with breast cancer 2007 in complete remission. Currently presented with hemoptysis with CT scan chest findings of alveolar hemorrhage and pulmonary critical care medicine consulted.  Background pulmonary history dates back to December 2012 when she had symptoms of polymyalgia rheumatica with the ESR 120 but autoimmune antibody was positive for c-ANCA at 1:320. Radiological imaging showed diffuse bilateral groundglass opacities. She at that time had clinical response to prednisone. surgical lung biopsy in January 2013 of the left side showed organizing pneumonia along with focal atypical glands (per Dr Annamaria Boots this an isolated focal finding only) but was diagnosed as low-grade new endocrine malignancy [subsequent 5-HIAA 24 hour urine test was normal]. I am not exactly sure whether she was treated with prednisone subsequently and if so for how long. She denies or does not recollect being on prolonged prednisone but certainly by June 5681 a CT scan of the chest showed clearance and she is being clinically monitored. A followup CT scan of the chest in December 2013 continue to show clear lung fields except for a new left upper lobe 1 cm groundglass opacity but this too on followup in April 2014 showed improvement.  As of June 2014 the oncologist did not feel there was any evidence of breast cancer recurrence  Currently 10/24/2012 she is admitted for 2-3 week history of cough with mild hemoptysis and associated nausea. Over the last few days she's having worsening symptoms. Hemoptysis is not massive but is constant and small amounts. She presented to the emergency department after failing sinus treatment. CT scan of the chest ruled  out pulmonary embolism but showed severe bilateral groundglass opacities consistent with alveolar hemorrhage pattern.  Pulmonary consultation is being therefore called   CULTURES: UC 7/31: neg  ANTIBIOTICS: azith 7/28>>>7/29 Rocephin 7/28>>>7/29 Bactrim 7/30 (prophylaxis)>>>   SIGNIFICANT EVENTS / STUDIES:  11/01/2012 - admit  11/02/12: GPA diagnoseed (PR-3 880)  STart 3 days solumedrold 523m bid x 3 days/6 doses New onset renal failure - renal consult   SUBJECTIVE/OVERNIGHT/INTERVAL HX 11/03/12: improved dyspnea but worsening creat. She thhink she is in renal failure due to drinking diet soda and not enough water.  STarted solumedrold yesterday 'nausea -improved with zofran & reglan C/o constipation Improved Uo with lasix  VITAL SIGNS: Temp:  [97 F (36.1 C)-97.8 F (36.6 C)] 97 F (36.1 C) (08/01 0400) Resp:  [15-27] 21 (08/01 0800) BP: (119-137)/(53-76) 128/62 mmHg (08/01 0800) SpO2:  [94 %-98 %] 94 % (08/01 0819) Weight:  [92.7 kg (204 lb 5.9 oz)] 92.7 kg (204 lb 5.9 oz) (08/01 0735) HEMODYNAMICS:   VENTILATOR SETTINGS:   INTAKE / OUTPUT: Intake/Output     07/31 0701 - 08/01 0700 08/01 0701 - 08/02 0700   P.O. 360    I.V. (mL/kg)     Other 370    IV Piggyback 108    Total Intake(mL/kg) 838 (9.2)    Urine (mL/kg/hr) 4100 (1.9)    Total Output 4100     Net -3262            PHYSICAL EXAMINATION: General:  Obese female no distress seated comfortably in the bed Neuro:  Alert and oriented x3, speech normal, moves all 4 extremities.  HEENT:  Pupils  equal and reactive to light. No cough. Neck is supple no neck nodes Cardiovascular:  Irregularly irregular but not tachycardic. No murmurs Lungs:  No respiratory distress but bilateral crackles present Abdomen:  Obese, soft, nontender, no organomegaly, no tenderness Musculoskeletal:  No cyanosis, no clubbing, no pedal edema Skin:  Intact without any rashes  LABS: PULMONARY No results found for this basename:  PHART, PCO2, PCO2ART, PO2, PO2ART, HCO3, TCO2, O2SAT,  in the last 168 hours  CBC  Recent Labs Lab 11/03/12 0310 11/04/12 0300 11/05/12 0354  HGB 8.1* 8.5* 9.3*  HCT 24.6* 25.6* 28.0*  WBC 8.4 10.3 7.0  PLT 257 292 316    COAGULATION  Recent Labs Lab 11/01/12 0825  INR 1.96*    CARDIAC    Recent Labs Lab 11/01/12 0825  TROPONINI <0.30    Recent Labs Lab 11/04/12 0300  PROBNP 22072.0*  CHEMISTRY  Recent Labs Lab 11/02/12 0430 11/03/12 0310 11/04/12 0300 11/04/12 1243 11/05/12 0354  NA 129* 124* 123* 125* 125*  K 4.6 5.5* 4.9 4.2 4.2  CL 97 93* 91* 90* 89*  CO2 _0 GLUCOSE 149* 275* 306* 364* 311*  BUN 21 31* 46* 51* 61*  CREATININE 1.71* 2.11* 2.25* 2.34* 2.42*  CALCIUM 8.5 8.5 8.5 8.5 8.7   Estimated Creatinine Clearance: 22.4 ml/min (by C-G formula based on Cr of 2.42).  Recent Labs Lab 11/01/12 0825 11/03/12 0310  AST 21 21  ALT 11 12  ALKPHOS 72 93  BILITOT 1.0 1.1  PROT 7.6 7.3  ALBUMIN 3.0* 2.7*  INR 1.96*  --   INFECTIOUS  Recent Labs Lab 11/01/12 1140 11/03/12 0316 11/05/12 0337  LATICACIDVEN 1.7  --   --   PROCALCITON <0.10 0.15 0.15  ENDOCRINE CBG (last 3)   Recent Labs  11/04/12 1141 11/04/12 1749 11/04/12 2209  GLUCAP 380* 365* 302*   IMAGING x48h  Dg Chest Port 1 View  11/04/2012   *RADIOLOGY REPORT*  Clinical Data: Bilateral perihilar air space disease.  History of breast cancer.  PORTABLE CHEST - 1 VIEW  Comparison: Chest x-ray 2 days prior.  Findings: Stable appearance of cardiopericardial enlargement. Aortic tortuosity as before.  Kerley B lines and fissural thickening.  There are additionally patchy bilateral lung opacities, most notable in the upper lungs, similar to prior.  No significant effusion.  No pneumothorax.  Negative osseous structures.  IMPRESSION:  1.  Stable appearance of the chest compared to 2 days prior.  There is pulmonary interstitial edema and bilateral consolidation.  2.   Unchanged cardiopericardial enlargement in this patient with cardiomegaly and pericardial effusion.   Original Report Authenticated By: Jorje Guild       ASSESSMENT / PLAN:  GPA with ALveolar Hemorrhage. RElapse since 2013/2013. 11/02/12 PR-3 880. MPO negative. - GBM neg Likely - capillaritis. C/w Granulomatous Polyangitis  (former Wegener).  Sepsis markers negative  11/02/12: started pulsed steroids 8/1 CXR and clinical exam much improved.   P:  - STARTED INDUCTION THERAPY on 7/29 with 2 drugs for SEVERE DISEASE  - Drug #1 - STEROIDS Pulse methylprednisolone 542m bid x 6 doses starting PM of 11/02/12, Then Oral glucocorticoid therapy, consists prednisone of 1 mg/kg per day (maximum of 60 to 80 mg/day) of oral prednisone (or its equivalent) for two to four weeks.  - Drug #2 RITUXAN - Induction therapy with rituximab (375 mg/m2 per week for four weeks); first dose 11/03/12  - PCP prophylaxis - BActrim 1 DS on M, W, F  A Fib. Rate controlled P:  Cards following, once GPA improved and hemoptysis better ok to start anticoagulation; with renal failure , eliquis contra-indicated-will need coumadin, hold off until clear that renal biopsy not desired  Microscopic Hematuria with new onset ATN 11/02/12 that is rapidly worse 11/03/12 P:   See pulm section If no improvement or getting worse, [facility] for [facility] bx vs [facility] Renal following Stop lasix   Anemia due to alveolar hemorrhage P:  Will start heparin sq bid 11/03/12 and monitor   INFECTIOUS A:  No evidence of infection P:   Check urine cutlure  diabetes   P:   ssi   TODAY'S SUMMARY: Critically ill GPA. Started rituxan & pulse steroids. Extensively described diagnosis, course, prognosis, side effects to sons x 2 and patient and husband. She is looking better. Would like to see her creatinine level off    BABCOCK,PETE  11/05/2012 8:24 AM   Baltazar Apo, MD, PhD 11/05/2012, 11:36 AM [facility] [phone] or if no answer [phone]

## 2012-11-05 NOTE — Progress Notes (Signed)
Subjective: breathing better   Physical Exam:  Blood pressure 115/78, pulse 76, temperature 97.7 F (36.5 C), temperature source Oral, resp. rate 18, height _0  (1.702 m), weight 92.7 kg (204 lb 5.9 oz), SpO2 100.00%. Gen: alert, not dyspneic Skin: no rash, cyanosis Neck: + JVD 12 cm, no LAN  Chest: bilat insp rales 2/3 up on R and 1/2 up on L CV: regular, blowing 2/6 syst M at apex Abdomen: soft, nontender, liver down 3cm Ext: no LE edema, no joint effusion, no gangrene/ulcers  Neuro: alert, Ox3, nonfocal   UA- 100 prot, 11-20 rbcs  Impression/Recs  1. Acute kidney injury / ANCA+ / pulm hemorrhage - getting induction Rx with  steroids and s/p 1 dose Rituximab. Check creatinine in am, do not expect sudden improvement. Would not start coumadin yet, may need renal bx; will decide soon 2. SOB- pulm hemorrhage / + pulm edema from vol overload- much better, cxr clearing up 3. Hyponatremia- from vol overload 4. Hyperkalemia- better, cont renal diet 5. HTN- stop norvasc, cont BB for afib/htn   6. Hx breast Ca 7. DM2 8. Hx Thedora Hinders  MD Pager [phone]    Cell  [phone] 11/05/2012, 9:04 PM   Recent Labs Lab 11/04/12 0300 11/04/12 1243 11/05/12 0354  NA 123* 125* 125*  K 4.9 4.2 4.2  CL 91* 90* 89*  CO2 _1 GLUCOSE 306* 364* 311*  BUN 46* 51* 61*  CREATININE 2.25* 2.34* 2.42*  CALCIUM 8.5 8.5 8.7    Recent Labs Lab 11/01/12 0825  11/03/12 0310 11/04/12 0300 11/05/12 0354  WBC 9.9  < > 8.4 10.3 7.0  NEUTROABS 6.9  --   --  9.4* 6.4  HGB 7.9*  < > 8.1* 8.5* 9.3*  HCT 23.8*  < > 24.6* 25.6* 28.0*  MCV 93.0  < > 93.2 91.8 91.2  PLT 268  < > 257 292 316  < > = values in this interval not displayed.

## 2012-11-06 LAB — CBC WITH DIFFERENTIAL/PLATELET
Basophils Absolute: 0 10*3/uL (ref 0.0–0.1)
Basophils Relative: 0 % (ref 0–1)
Eosinophils Absolute: 0 10*3/uL (ref 0.0–0.7)
HCT: 25.7 % — ABNORMAL LOW (ref 36.0–46.0)
Hemoglobin: 7.9 g/dL — ABNORMAL LOW (ref 12.0–15.0)
MCH: 27.9 pg (ref 26.0–34.0)
MCHC: 30.7 g/dL (ref 30.0–36.0)
Monocytes Absolute: 0.6 10*3/uL (ref 0.1–1.0)
Monocytes Relative: 7 % (ref 3–12)
Neutro Abs: 6.9 10*3/uL (ref 1.7–7.7)
Neutrophils Relative %: 87 % — ABNORMAL HIGH (ref 43–77)
RDW: 13.2 % (ref 11.5–15.5)

## 2012-11-06 LAB — BASIC METABOLIC PANEL
BUN: 68 mg/dL — ABNORMAL HIGH (ref 6–23)
Creatinine, Ser: 2.32 mg/dL — ABNORMAL HIGH (ref 0.50–1.10)
GFR calc Af Amer: 22 mL/min — ABNORMAL LOW (ref >90)
GFR calc non Af Amer: 19 mL/min — ABNORMAL LOW (ref >90)
Glucose, Bld: 258 mg/dL — ABNORMAL HIGH (ref 70–99)
Potassium: 3.7 mEq/L (ref 3.5–5.1)

## 2012-11-06 NOTE — Progress Notes (Signed)
Subjective: breathing better   Physical Exam:  Blood pressure 115/78, pulse 76, temperature 97.7 F (36.5 C), temperature source Oral, resp. rate 18, height _0  (1.702 m), weight 92.7 kg (204 lb 5.9 oz), SpO2 100.00%. Gen: alert, not dyspneic Skin: no rash, cyanosis Neck: + JVD 12 cm, no LAN  Chest: bilat insp rales 2/3 up on R and 1/2 up on L CV: regular, blowing 2/6 syst M at apex Abdomen: soft, nontender, liver down 3cm Ext: no LE edema, no joint effusion, no gangrene/ulcers  Neuro: alert, Ox3, nonfocal   UA- 100 prot, 11-20 rbcs  Impression/Recs  1. Acute kidney injury / ANCA+ / pulm hemorrhage - has received induction Rx with bolus steroids and Rituxan, prob has renal involvement. Do not recommend renal biopsy at this time as it would not change management.  Discharge soon ok if renal function stable, not deteriorating.  Will stop IVF's.   2. Pulm hemorrhage/pulm edema- improved 3. Hyponatremia- improving 4. HTN- stop norvasc, cont BB for afib/htn   5. Hx breast Ca 6. DM2 7. Hx Thedora Hinders  MD Pager [phone]    Cell  [phone] 11/06/2012, 7:51 PM   Recent Labs Lab 11/04/12 1243 11/05/12 0354 11/06/12 0541  NA 125* 125* 129*  K 4.2 4.2 3.7  CL 90* 89* 91*  CO2 _1 GLUCOSE 364* 311* 258*  BUN 51* 61* 68*  CREATININE 2.34* 2.42* 2.32*  CALCIUM 8.5 8.7 8.1*    Recent Labs Lab 11/04/12 0300 11/05/12 0354 11/06/12 0541  WBC 10.3 7.0 7.9  NEUTROABS 9.4* 6.4 6.9  HGB 8.5* 9.3* 7.9*  HCT 25.6* 28.0* 25.7*  MCV 91.8 91.2 90.8  PLT 292 316 314

## 2012-11-06 NOTE — Progress Notes (Signed)
Physical Therapy Treatment Patient Details Name: [name] [name] MRN: [id number] DOB: 1932/11/01 Today's Date: 11/06/2012 Time: 8372-9021 PT Time Calculation (min): 17 min  PT Assessment / Plan / Recommendation  History of Present Illness [name] [name] is a 79 y.o. female with history of hypothyroidism, hyperlipidemia, hypertension, hyponatremia, anemia, type II DM, CVA, atrial fibrillation on Eliquis, former smoker, breast cancer in remission, cryptogenic organizing pneumonia, presented to the [facility] ED on 11/01/12 with 3 weeks history of worsening cough with blood.    PT Comments   Pt ambulated with no O2 with sats drop to 79 briefly. Returned to 94 % in 30 secs. Will walk with O2 set up next visist and give a short trial of no O2  Unless MD wants O2  For all mobility.  Follow Up Recommendations  Home health PT     Does the patient have the potential to tolerate intense rehabilitation     Barriers to Discharge        Equipment Recommendations  None recommended by PT    Recommendations for Other Services    Frequency Min 3X/week   Progress towards PT Goals Progress towards PT goals: Progressing toward goals  Plan Current plan remains appropriate    Precautions / Restrictions Precautions Precaution Comments: pt reports she has had vertigo from several years, monitor sats, did not walk w/ O2 and sats dropped.   Pertinent Vitals/Pain Pre 97% 2 l HR 97 Post 79% RA HR 100 Post rest 94% 2 l    Mobility  Transfers Transfers: Sit to Stand;Stand to Sit Sit to Stand: 4: Min guard;From chair/3-in-1;With upper extremity assist Stand to Sit: 4: Min guard;To chair/3-in-1;With upper extremity assist Details for Transfer Assistance: unsteady upon standing. Ambulation/Gait Ambulation/Gait Assistance: 4: Min assist Ambulation Distance (Feet): 150 Feet Assistive device: Rolling walker Ambulation/Gait Assistance Details: Pt needs steady assistance for ambulating, pt repetedly  states she has vertigo but did not c/o  it. Gait Pattern: Step-through pattern;Decreased step length - right;Decreased step length - left Gait velocity: decreased     Exercises     PT Diagnosis:    PT Problem List:   PT Treatment Interventions:     PT Goals (current goals can now be found in the care plan section)    Visit Information  Last PT Received On: 11/06/12 Assistance Needed: +1 History of Present Illness: [name] [name] is a 79 y.o. female with history of hypothyroidism, hyperlipidemia, hypertension, hyponatremia, anemia, type II DM, CVA, atrial fibrillation on Eliquis, former smoker, breast cancer in remission, cryptogenic organizing pneumonia, presented to the [facility] ED on 11/01/12 with 3 weeks history of worsening cough with blood.     Subjective Data      Cognition  Cognition Arousal/Alertness: Awake/alert    Licensed conveyancer Standing Balance Static Standing - Balance Support: Bilateral upper extremity supported Static Standing - Level of Assistance: 6: Modified independent (Device/Increase time)  End of Session PT - End of Session Activity Tolerance: Patient tolerated treatment well Patient left: in chair;with call bell/phone within reach;with family/visitor present Nurse Communication: Mobility status   GP     Claretha Cooper 11/06/2012, 3:33 PM Tresa Endo PT [phone]

## 2012-11-06 NOTE — Progress Notes (Signed)
PULMONARY  / CRITICAL CARE MEDICINE  Name: [name] [name] MRN: [id number] DOB: 1933-04-01    ADMISSION DATE:  11/01/2012 CONSULTATION DATE:  11/01/2012    REFERRING MD :  Dr Algis Liming PRIMARY SERVICE: [facility]  CHIEF COMPLAINT:  Hemoptysis, ? alv hem  HPI:  79 year old former smoker with breast cancer 2007 in complete remission. Currently presented with hemoptysis with CT scan chest findings of alveolar hemorrhage and pulmonary critical care medicine consulted.     CULTURES: UC 7/31: neg  ANTIBIOTICS: azith 7/28>>>7/29 Rocephin 7/28>>>7/29 Bactrim 7/30 (prophylaxis)>>>   SIGNIFICANT EVENTS / STUDIES:  11/01/2012 - admit  11/02/12: GPA diagnosed (PR-3 880)  rx 3 days solumedrold 581m bid x 3 days/6 doses New onset renal failure - renal consult   SUBJECTIVE/OVERNIGHT/INTERVAL HX No sob, no hemoptysis  VITAL SIGNS: Temp:  [97.7 F (36.5 C)-98.2 F (36.8 C)] 98.2 F (36.8 C) (08/02 0543) Pulse Rate:  [72-78] 72 (08/02 0543) Resp:  [18] 18 (08/01 2200) BP: (115-125)/(58-78) 124/74 mmHg (08/02 0543) SpO2:  [96 %-100 %] 99 % (08/02 0543) Weight:  [202 lb 2.6 oz (91.7 kg)] 202 lb 2.6 oz (91.7 kg) (08/02 0543) FIO2   2lpm NP     INTAKE / OUTPUT: Intake/Output     08/01 0701 - 08/02 0700 08/02 0701 - 08/03 0700   P.O. 1090 260   I.V. (mL/kg) 1500 (16.4)    Other 50    IV Piggyback 54    Total Intake(mL/kg) 2694 (29.4) 260 (2.8)   Urine (mL/kg/hr) 4100 (1.9)    Total Output 4100     Net -1406 +260          PHYSICAL EXAMINATION: General:  Obese female no distress seated comfortably in the bed Neuro:  Alert and oriented x3, speech normal, moves all 4 extremities.  HEENT:  Pupils equal and reactive to light. No cough. Neck is supple no neck nodes Cardiovascular:  Irregularly irregular but not tachycardic. No murmurs Lungs:  No respiratory distress but bilateral crackles present Abdomen:  Obese, soft, nontender, no organomegaly, no tenderness Musculoskeletal:  No  cyanosis, no clubbing, no pedal edema Skin:  Intact without any rashes  LABS: PULMONARY No results found for this basename: PHART, PCO2, PCO2ART, PO2, PO2ART, HCO3, TCO2, O2SAT,  in the last 168 hours  CBC  Recent Labs Lab 11/04/12 0300 11/05/12 0354 11/06/12 0541  HGB 8.5* 9.3* 7.9*  HCT 25.6* 28.0* 25.7*  WBC 10.3 7.0 7.9  PLT 292 316 314    COAGULATION  Recent Labs Lab 11/01/12 0825  INR 1.96*    CARDIAC    Recent Labs Lab 11/01/12 0825  TROPONINI <0.30    Recent Labs Lab 11/04/12 0300  PROBNP 22072.0*  CHEMISTRY  Recent Labs Lab 11/03/12 0310 11/04/12 0300 11/04/12 1243 11/05/12 0354 11/06/12 0541  NA 124* 123* 125* 125* 129*  K 5.5* 4.9 4.2 4.2 3.7  CL 93* 91* 90* 89* 91*  CO2 _0 GLUCOSE 275* 306* 364* 311* 258*  BUN 31* 46* 51* 61* 68*  CREATININE 2.11* 2.25* 2.34* 2.42* 2.32*  CALCIUM 8.5 8.5 8.5 8.7 8.1*   Estimated Creatinine Clearance: 23.2 ml/min (by C-G formula based on Cr of 2.32).  Recent Labs Lab 11/01/12 0825 11/03/12 0310  AST 21 21  ALT 11 12  ALKPHOS 72 93  BILITOT 1.0 1.1  PROT 7.6 7.3  ALBUMIN 3.0* 2.7*  INR 1.96*  --   INFECTIOUS  Recent Labs Lab 11/01/12 1140 11/03/12 0316 11/05/12 02023  LATICACIDVEN 1.7  --   --   PROCALCITON <0.10 0.15 0.15  ENDOCRINE CBG (last 3)   Recent Labs  11/05/12 1624 11/05/12 2205 11/06/12 0748  GLUCAP 318* 288* 257*   IMAGING x48h  Dg Chest Port 1 View  11/05/2012   *RADIOLOGY REPORT*  Clinical Data: Wegener's granulomatosis.  Pulmonary infiltrates and hemorrhage.  PORTABLE CHEST - 1 VIEW  Comparison: 07/31 and 11/02/2012  Findings: The infiltrates in the right lung have almost resolved and there is marked improvement in the infiltrates in the left lung.  There is persistent cardiomegaly with pulmonary vascular congestion.  No appreciable effusions.  No acute osseous abnormality.  IMPRESSION: Significant improvement in the bilateral pulmonary infiltrates with  almost complete resolution on the right.   Original Report Authenticated By: Lorriane Shire, M.D.       ASSESSMENT / PLAN:  GPA with ALveolar Hemorrhage. RElapse since 2013  11/02/12 PR-3 880. MPO negative. - GBM neg Likely - capillaritis. C/w Granulomatous Polyangitis  (former Wegener).  Sepsis markers negative  11/02/12: started pulsed steroids 8/1 CXR and clinical exam much improved.   P:  - STARTED INDUCTION THERAPY on 7/29 with 2 drugs for SEVERE DISEASE  - Drug #1 - STEROIDS Pulse methylprednisolone 566m bid x 6 doses starting PM of 11/02/12, Then Oral glucocorticoid therapy, consists prednisone of 1 mg/kg per day (maximum of 60 to 80 mg/day) of oral prednisone (or its equivalent) for two to four weeks.  - Drug #2 RITUXAN - Induction therapy with rituximab (375 mg/m2 per week for four weeks); first dose 11/03/12  - PCP prophylaxis - BActrim 1 DS on M, W, F  A Fib. Rate controlled P:  Cards following, once GPA improved and hemoptysis better ok to start anticoagulation; with renal failure , eliquis contra-indicated-will need coumadin, hold off until clear that renal biopsy not desired  Microscopic Hematuria with new onset  ARF  11/02/12   worse 11/03/12 Lab Results  Component Value Date   CREATININE 2.32* 11/06/2012   CREATININE 2.42* 11/05/2012   CREATININE 2.34* 11/04/2012   CREATININE 1.3* 08/02/2012   CREATININE 1.2* 03/26/2012   CREATININE 1.0 09/09/2011    P:   Renal following     Anemia due to alveolar hemorrhage P:  rx heparin sq bid 11/03/12 and monitor   INFECTIOUS A:  No evidence of infection    diabetes   P:   ssi       MChristinia Gully MD Pulmonary and [phone] 5:30 PM or weekends, call [phone]

## 2012-11-06 NOTE — Progress Notes (Signed)
Patient provide educational information on the renal diet.

## 2012-11-07 LAB — CBC WITH DIFFERENTIAL/PLATELET
Basophils Relative: 0 % (ref 0–1)
Eosinophils Absolute: 0 10*3/uL (ref 0.0–0.7)
Eosinophils Relative: 0 % (ref 0–5)
HCT: 26.7 % — ABNORMAL LOW (ref 36.0–46.0)
Hemoglobin: 8.8 g/dL — ABNORMAL LOW (ref 12.0–15.0)
MCH: 30.7 pg (ref 26.0–34.0)
MCHC: 33 g/dL (ref 30.0–36.0)
MCV: 93 fL (ref 78.0–100.0)
Monocytes Absolute: 0.8 10*3/uL (ref 0.1–1.0)
Monocytes Relative: 9 % (ref 3–12)
Neutro Abs: 8 10*3/uL — ABNORMAL HIGH (ref 1.7–7.7)
RDW: 13.6 % (ref 11.5–15.5)

## 2012-11-07 LAB — BASIC METABOLIC PANEL
BUN: 61 mg/dL — ABNORMAL HIGH (ref 6–23)
Chloride: 96 mEq/L (ref 96–112)
Creatinine, Ser: 1.81 mg/dL — ABNORMAL HIGH (ref 0.50–1.10)
Glucose, Bld: 210 mg/dL — ABNORMAL HIGH (ref 70–99)
Potassium: 3.7 mEq/L (ref 3.5–5.1)

## 2012-11-07 LAB — GLUCOSE, CAPILLARY: Glucose-Capillary: 170 mg/dL — ABNORMAL HIGH (ref 70–99)

## 2012-11-07 NOTE — Progress Notes (Signed)
Sitting up in chair visiting with husband. Pt ate late breakfast so she received her insulin late. Held noon time insulin. Anticipating discharge 8/4.

## 2012-11-07 NOTE — Progress Notes (Signed)
PULMONARY  / CRITICAL CARE MEDICINE  Name: [name] [name] MRN: [id number] DOB: 02/18/1933    ADMISSION DATE:  11/01/2012 CONSULTATION DATE:  11/01/2012    REFERRING MD :  Dr Algis Liming PRIMARY SERVICE: [facility]  CHIEF COMPLAINT:  Hemoptysis, ? alv hem  HPI:  79 year old former smoker with breast cancer 2007 in complete remission. Presented with hemoptysis with CT scan chest findings of alveolar hemorrhage and pulmonary critical care medicine consulted.     CULTURES: UC 7/31: neg  ANTIBIOTICS: azith 7/28>>>7/29 Rocephin 7/28>>>7/29 Bactrim 7/30 (prophylaxis)>>>   SIGNIFICANT EVENTS / STUDIES:  7/29  GPA diagnosed (PR-3 880)  rx 3 days solumedrold 589m bid x 3 days/6 doses New onset renal failure - renal consult 7/30 Rituxmab started  SUBJECTIVE/OVERNIGHT/INTERVAL HX No sob but still on 2.5 lpm, still coughing up thick dark bloody plugs esp in am  VITAL SIGNS: Temp:  [97.5 F (36.4 C)-97.8 F (36.6 C)] 97.5 F (36.4 C) (08/03 0600) Pulse Rate:  [69-81] 73 (08/03 0600) Resp:  [16-18] 18 (08/03 0600) BP: (131-148)/(69-81) 148/80 mmHg (08/03 0600) SpO2:  [98 %-99 %] 99 % (08/03 0600) FIO2   2.5lpm NP     INTAKE / OUTPUT: Intake/Output     08/02 0701 - 08/03 0700 08/03 0701 - 08/04 0700   P.O. 680 15   I.V. (mL/kg)     Other     IV Piggyback     Total Intake(mL/kg) 680 (7.4) 15 (0.2)   Urine (mL/kg/hr) 2100 (1) 600 (1.9)   Total Output 2100 600   Net -1420 -585          PHYSICAL EXAMINATION: General:  Obese female no distress seated comfortably in the bed Neuro:  Alert and oriented x3, speech normal, moves all 4 extremities.  HEENT:  Pupils equal and reactive to light. No cough. Neck is supple no neck nodes Cardiovascular:  Irregularly irregular but not tachycardic. No murmurs Lungs:  No respiratory distress but bilateral crackles present Abdomen:  Obese, soft, nontender, no organomegaly, no tenderness Musculoskeletal:  No cyanosis, no clubbing, no pedal  edema Skin:  Intact without any rashes  LABS: PULMONARY No results found for this basename: PHART, PCO2, PCO2ART, PO2, PO2ART, HCO3, TCO2, O2SAT,  in the last 168 hours  CBC  Recent Labs Lab 11/05/12 0354 11/06/12 0541 11/07/12 0610  HGB 9.3* 7.9* 8.8*  HCT 28.0* 25.7* 26.7*  WBC 7.0 7.9 9.8  PLT 316 314 361    COAGULATION  Recent Labs Lab 11/01/12 0825  INR 1.96*    CARDIAC    Recent Labs Lab 11/01/12 0825  TROPONINI <0.30    Recent Labs Lab 11/04/12 0300  PROBNP 22072.0*  CHEMISTRY  Recent Labs Lab 11/04/12 0300 11/04/12 1243 11/05/12 0354 11/06/12 0541 11/07/12 0610  NA 123* 125* 125* 129* 133*  K 4.9 4.2 4.2 3.7 3.7  CL 91* 90* 89* 91* 96  CO2 _0 GLUCOSE 306* 364* 311* 258* 210*  BUN 46* 51* 61* 68* 61*  CREATININE 2.25* 2.34* 2.42* 2.32* 1.81*  CALCIUM 8.5 8.5 8.7 8.1* 8.8   Estimated Creatinine Clearance: 29.8 ml/min (by C-G formula based on Cr of 1.81).  Recent Labs Lab 11/01/12 0825 11/03/12 0310  AST 21 21  ALT 11 12  ALKPHOS 72 93  BILITOT 1.0 1.1  PROT 7.6 7.3  ALBUMIN 3.0* 2.7*  INR 1.96*  --   INFECTIOUS  Recent Labs Lab 11/01/12 1140 11/03/12 0316 11/05/12 0337  LATICACIDVEN 1.7  --   --  PROCALCITON <0.10 0.15 0.15  ENDOCRINE CBG (last 3)   Recent Labs  11/06/12 1159 11/06/12 1644 11/06/12 2208  GLUCAP 367* 251* 247*   IMAGING x48h  No results found.     ASSESSMENT / PLAN:  GPA with ALveolar Hemorrhage. RElapse since 2013  11/02/12 PR-3 880. MPO negative. - GBM neg Likely - capillaritis. C/w Granulomatous Polyangitis  (former Wegener).  Sepsis markers negative  - STARTED INDUCTION THERAPY on 7/29 with 2 drugs for SEVERE DISEASE  - Drug #1 - STEROIDS Pulse methylprednisolone 511m bid x 6 doses starting PM of 11/02/12, Then Oral glucocorticoid therapy, consists prednisone of 1 mg/kg per day (maximum of 60 to 80 mg/day) of oral prednisone (or its equivalent) for two to four weeks.  - Drug  #2 RITUXAN - Induction therapy with rituximab (375 mg/m2 per week for four weeks); first dose 11/03/12  - PCP prophylaxis - BActrim 1 DS on M, W, F rec  ? Home 8/4   A Fib. Rate controlled P:  Cards following, once GPA improved and hemoptysis better ok to start anticoagulation; with renal failure , eliquis contra-indicated-will need coumadin, hold off until clear that renal biopsy not desired  Microscopic Hematuria with new onset  ARF  11/02/12      Lab Results  Component Value Date   CREATININE 1.81* 11/07/2012   CREATININE 2.32* 11/06/2012   CREATININE 2.42* 11/05/2012   CREATININE 1.3* 08/02/2012   CREATININE 1.2* 03/26/2012   CREATININE 1.0 09/09/2011  A  improving P:   Renal signed off 8/3      Anemia due to alveolar hemorrhage  Recent Labs Lab 11/05/12 0354 11/06/12 0541 11/07/12 0610  HGB 9.3* 7.9* 8.8*    P:  rx heparin sq bid 11/03/12  Tolerating ok, no fresh hem   INFECTIOUS A:  No evidence of infection    diabetes   P:   ssi       MChristinia Gully MD Pulmonary and [phone] 5:30 PM or weekends, call [phone]

## 2012-11-07 NOTE — Progress Notes (Signed)
Subjective: No complaints, creat down 1.8  Physical Exam:  Blood pressure 115/78, pulse 76, temperature 97.7 F (36.5 C), temperature source Oral, resp. rate 18, height _0  (1.702 m), weight 92.7 kg (204 lb 5.9 oz), SpO2 100.00%. Gen: alert, not dyspneic Skin: no rash, cyanosis Neck: no JVD, no LAN  Chest: mostly clear on R, L base crackles CV: regular, blowing 2/6 syst M at apex Abdomen: soft, nontender, liver down 3cm Ext: no edema  Neuro: alert, Ox3, nonfocal   UA- 100 prot, 11-20 rbcs  Impression/Recs  1. Acute kidney injury / ANCA+ / pulm hemorrhage - has received induction Rx with bolus steroids and Rituxan. Renal function improving, agree with plans per pulm. Will sign off, will contact pt with office f/u appt.     2. Pulm hemorrhage/pulm edema- improved 3. Hyponatremia- better 4. Hx breast Ca 5. DM2 6. Hx Thedora Hinders  MD Pager [phone]    Cell  [phone] 11/07/2012, 1:15 PM   Recent Labs Lab 11/05/12 0354 11/06/12 0541 11/07/12 0610  NA 125* 129* 133*  K 4.2 3.7 3.7  CL 89* 91* 96  CO2 _1 GLUCOSE 311* 258* 210*  BUN 61* 68* 61*  CREATININE 2.42* 2.32* 1.81*  CALCIUM 8.7 8.1* 8.8    Recent Labs Lab 11/05/12 0354 11/06/12 0541 11/07/12 0610  WBC 7.0 7.9 9.8  NEUTROABS 6.4 6.9 8.0*  HGB 9.3* 7.9* 8.8*  HCT 28.0* 25.7* 26.7*  MCV 91.2 90.8 93.0  PLT 316 314 361

## 2012-11-08 ENCOUNTER — Inpatient Hospital Stay (HOSPITAL_COMMUNITY): Payer: Medicare Other

## 2012-11-08 ENCOUNTER — Telehealth: Payer: Self-pay | Admitting: Adult Health

## 2012-11-08 ENCOUNTER — Telehealth: Payer: Self-pay | Admitting: *Deleted

## 2012-11-08 DIAGNOSIS — E871 Hypo-osmolality and hyponatremia: Secondary | ICD-10-CM

## 2012-11-08 LAB — GLUCOSE, CAPILLARY: Glucose-Capillary: 151 mg/dL — ABNORMAL HIGH (ref 70–99)

## 2012-11-08 MED ORDER — MOMETASONE FURO-FORMOTEROL FUM 100-5 MCG/ACT IN AERO: 2.0000 | INHALATION_SPRAY | Freq: Two times a day (BID) | RESPIRATORY_TRACT | Status: DC

## 2012-11-08 MED ORDER — SULFAMETHOXAZOLE-TMP DS 800-160 MG PO TABS: ORAL_TABLET | ORAL | Status: DC

## 2012-11-08 MED ORDER — PREDNISONE 20 MG PO TABS: 60.0000 mg | ORAL_TABLET | Freq: Every day | ORAL | Status: DC

## 2012-11-08 MED ORDER — GLUCERNA SHAKE PO LIQD
237.0000 mL | Freq: Two times a day (BID) | ORAL | Status: DC
  Filled 2012-11-08 (×2): qty 237

## 2012-11-08 MED ORDER — GABAPENTIN 100 MG PO CAPS: 100.0000 mg | ORAL_CAPSULE | Freq: Two times a day (BID) | ORAL | Status: DC

## 2012-11-08 MED ORDER — SULFAMETHOXAZOLE-TMP DS 800-160 MG PO TABS: 1.0000 | ORAL_TABLET | ORAL | Status: DC

## 2012-11-08 NOTE — Telephone Encounter (Signed)
Order placed for lab [facility] for pt

## 2012-11-08 NOTE — Progress Notes (Signed)
Physical Therapy Treatment Patient Details Name: SHANNA UN MRN: [id number] DOB: [birth year] Today's Date: 11/08/2012 Time: 1217-1226 PT Time Calculation (min): 9 min  PT Assessment / Plan / Recommendation  History of Present Illness Kamica B Quattrone is a 79 y.o. female with history of hypothyroidism, hyperlipidemia, hypertension, hyponatremia, anemia, type II DM, CVA, atrial fibrillation on Eliquis, former smoker, breast cancer in remission, cryptogenic organizing pneumonia, presented to the [facility] ED on 11/01/12 with 3 weeks history of worsening cough with blood.    PT Comments   Progressing with mobility. O2 sats continue to drop while ambulating however sat readings fluctuated during session: 80%-90% on RA while ambulating. Did not require O2 to be put back on for recovery. With cues for deep breathing, O2 sats 92% on RA at rest end of session. Recommended to pt and husband that pt use RW-(pt states she has one) for ambulation. Pt states she will have plenty of assist available at home.   Follow Up Recommendations  No PT follow up     Does the patient have the potential to tolerate intense rehabilitation     Barriers to Discharge        Equipment Recommendations  None recommended by PT    Recommendations for Other Services OT consult  Frequency Min 3X/week   Progress towards PT Goals Progress towards PT goals: Progressing toward goals  Plan Discharge plan needs to be updated    Precautions / Restrictions Precautions Precautions: Fall Restrictions Weight Bearing Restrictions: No   Pertinent Vitals/Pain O2: 91% RA at rest; 80-90% on RA during ambulation (mostly remained <88%); 92% RA end of session    Mobility  Bed Mobility Bed Mobility: Supine to Sit Supine to Sit: HOB elevated;6: Modified independent (Device/Increase time) Transfers Transfers: Sit to Stand;Stand to Sit Sit to Stand: 4: Min assist;From bed Stand to Sit: 5: Supervision;To bed Details for  Transfer Assistance: Husband assisted pt to standing.  Ambulation/Gait Ambulation/Gait Assistance: 5: Supervision Ambulation Distance (Feet): 125 Feet Assistive device: Rolling walker Ambulation/Gait Assistance Details: slow gait speed. 2 brief standing rest breaks. O2 sats fluctuated between 80-90% on RA while ambulating. VCs for pursed lip breathing. Pt denied SOB.  Gait Pattern: Step-through pattern;Decreased stride length    Exercises     PT Diagnosis:    PT Problem List:   PT Treatment Interventions:     PT Goals (current goals can now be found in the care plan section)    Visit Information  Last PT Received On: 11/08/12 Assistance Needed: +1 History of Present Illness: Deundra B Goodnow is a 79 y.o. female with history of hypothyroidism, hyperlipidemia, hypertension, hyponatremia, anemia, type II DM, CVA, atrial fibrillation on Eliquis, former smoker, breast cancer in remission, cryptogenic organizing pneumonia, presented to the [facility] ED on 11/01/12 with 3 weeks history of worsening cough with blood.     Subjective Data      Cognition  Cognition Arousal/Alertness: Awake/alert Behavior During Therapy: WFL for tasks assessed/performed Overall Cognitive Status: Within Functional Limits for tasks assessed    Balance     End of Session PT - End of Session Activity Tolerance: Patient tolerated treatment well Patient left: in bed;with call bell/phone within reach;with family/visitor present   GP     Weston Anna, MPT Pager: [phone]

## 2012-11-08 NOTE — Telephone Encounter (Signed)
Pt returned call and can be reached @ [phone]. [name] [name]

## 2012-11-08 NOTE — Progress Notes (Signed)
Patient discharged home in stable condition.  No change from morning assessment.  Discharge instructions provided with verbal understanding.

## 2012-11-08 NOTE — Progress Notes (Signed)
Inpatient Diabetes Program Recommendations  AACE/ADA: New Consensus Statement on Inpatient Glycemic Control (2013)  Target Ranges:  Prepandial:   less than 140 mg/dL      Peak postprandial:   less than 180 mg/dL (1-2 hours)      Critically ill patients:  140 - 180 mg/dL   Reason for Visit: Hyperglycemia  Results for [name] [name], [name] [name] (MRN [id number]) as of 11/08/2012 11:34  Ref. Range 11/06/2012 16:44 11/06/2012 22:08 11/07/2012 11:10 11/07/2012 15:07 11/07/2012 17:33 11/07/2012 21:47 11/08/2012 07:43  Glucose-Capillary Latest Range: 70-99 mg/dL 251 (H) 247 (H) 248 (H) 170 (H) 221 (H) 313 (H) 159 (H)   FBS much improved.    Recommend increasing Novolog to 8 units tidwc if pt eats >50% meal, while on Prednisone.  Will continue to follow. Thank you. Lorenda Peck, RD, LDN, CDE Inpatient Diabetes Coordinator  [phone]

## 2012-11-08 NOTE — Telephone Encounter (Signed)
Lm gv appts for 11/10/12 w/ labs @ 9:30am and ov@ 10am with treatment to follow. i also gv appts for 8/13 and 8/20. i emailed MB to add the treatments...td

## 2012-11-08 NOTE — Telephone Encounter (Signed)
I called and made pt aware lab order placed. Nothing further was needed.

## 2012-11-08 NOTE — Progress Notes (Signed)
NUTRITION FOLLOW UP  Intervention:   - Brief education of renal diet provided, however expect renal diet only needed to be followed temporarily for pt's acute kidney injury, encouraged pt to ask MD about this issue  - Encouraged increased PO intake - Will continue to monitor   Nutrition Dx:   Inadequate oral intake related to poor appetite as evidenced by 3% wt loss in less than 2 months and PO intake 50% per RN notes - improved    Goal:   Pt to meet >/= 90% of their estimated nutrition needs - not met, but improving, intake 65-75% of meals   Monitor:   Weights, labs, intake  Assessment:   RN contacted RD to provide pt with education on renal diet as pt with questions. Noted pt followed by nephrology for acute kidney injury and noted renal function improving and signed off yesterday. Pt without history of kidney disease. Discussed with pt renal diet likely ordered for pt to follow only during admission for acute kidney injury. Encouraged pt to ask MD if renal diet needed to be followed after discharge. Brief renal diet education provided which included discussion of low phosphorus and potassium foods. PO intake 65-75% of meals. Noted possible d/c today.   Pt with low sodium, elevated but stable BUN, elevated Cr trending down, and low GFR improving slightly. Phosphorus not ordered.   Height: Ht Readings from Last 1 Encounters:  11/02/12 _0  (1.702 m)    Weight Status:   Wt Readings from Last 1 Encounters:  11/08/12 202 lb 13.2 oz (92 kg)    Re-estimated needs:  Kcal: 1770-1920  Protein: 90-100 grams  Fluid: 2.7 L   Skin: Intact   Diet Order: Renal   Intake/Output Summary (Last 24 hours) at 11/08/12 1040 Last data filed at 11/07/12 1820  Gross per 24 hour  Intake 2923.25 ml  Output      0 ml  Net 2923.25 ml    Last BM: 7/29   Labs:   Recent Labs Lab 11/05/12 0354 11/06/12 0541 11/07/12 0610  NA 125* 129* 133*  K 4.2 3.7 3.7  CL 89* 91* 96  CO2 _1 BUN 61* 68* 61*  CREATININE 2.42* 2.32* 1.81*  CALCIUM 8.7 8.1* 8.8  GLUCOSE 311* 258* 210*    CBG (last 3)   Recent Labs  11/07/12 1733 11/07/12 2147 11/08/12 0743  GLUCAP 221* 313* 159*    Scheduled Meds: . atorvastatin  10 mg Oral q1800  . fluticasone  2 spray Each Nare Daily  . gabapentin  100 mg Oral BID AC  . heparin subcutaneous  5,000 Units Subcutaneous Q12H  . insulin aspart  0-20 Units Subcutaneous TID WC  . insulin aspart  0-5 Units Subcutaneous QHS  . insulin aspart  6 Units Subcutaneous TID WC  . insulin glargine  20 Units Subcutaneous Daily  . levothyroxine  125 mcg Oral QAC breakfast  . mometasone-formoterol  2 puff Inhalation BID  . multivitamin with minerals  1 tablet Oral Daily  . nebivolol  10 mg Oral Daily  . polyethylene glycol  17 g Oral Daily  . predniSONE  60 mg Oral Q breakfast  . sodium chloride  3 mL Intravenous Q12H  . sulfamethoxazole-trimethoprim  1 tablet Oral 3 times weekly    Continuous Infusions: . sodium chloride 75 mL/hr at 11/07/12 [place] MS, RD, LDN [phone] Pager [phone] After Hours Pager

## 2012-11-08 NOTE — Discharge Summary (Addendum)
Physician Discharge Summary     Patient ID: [name] [name] MRN: [id number] DOB/AGE: [id number] 77 y.o.  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 Relapse since 2013 11/02/12 PR-3 880. MPO negative. - GBM neg Likely - capillaritis. C/w  Sepsis markers negative A Fib. 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 Currently presented with hemoptysis with CT scan chest findings of alveolar hemorrhage and pulmonary critical care medicine consulted.  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 Radiological imaging showed diffuse bilateral groundglass opacities. She at that time had clinical response to prednisone. surgical lung biopsy in January 2013 of the left side showed organizing pneumonia along with focal atypical glands but was diagnosed as low-grade new endocrine malignancy [subsequent 5-HIAA 24 hour urine test was normal]. I am not exactly sure whether she was treated with prednisone subsequently and if so for how long. She denied or does not recollect being on prolonged prednisone but certainly by June 6962 a CT scan of the chest showed clearance and she is being clinically monitored. A followup CT scan of the chest in December 2013 continue to show clear lung fields except for a new left upper lobe 1 cm groundglass opacity but this too on followup in April 2014 showed improvement.   As of June 2014 the oncologist did not feel there was any evidence of breast cancer recurrence. Currently 10/24/2012 she is admitted for 2-3 week  history of cough with mild hemoptysis and associated nausea. Over the last few days she's having worsening symptoms. Hemoptysis is not massive but is constant and small amounts. She presented to the emergency department after failing sinus treatment. CT scan of the chest ruled out pulmonary embolism but showed severe bilateral groundglass opacities consistent with alveolar hemorrhage pattern. Pulmonary consultation was called. She was initially treated by stopping eliquis, providing empiric antibiotics and supported with diuresis.   We felt The clinical picture was felt to be consistent with diffuse alveolar hemorrhage resulting in hemoptysis. Most likely cause of alveolar hemorrhage was felt to be autoimmune based on past history of autoimmune antibody positivity and lung biopsy showing organizing pneumonia in December 2012/January 2013. Prior C.-ANCA positivethough no evidence of vasculitis in Jan 2013 lung bx which is puzzling. Alternative is relapsing BOOP but the hemoptysis would be unsual unless precipitated by Eliquis. On 11/02/12 PR-3 880. Nephrology consult was called as immunology work up was pending and we were concerned she might need plasma phoresis should her anti-gbm be positive. Also we were noting climbing creatinine.   On 7/29 immunosuppression therapy was initiated. She was given Pulse methylprednisolone, for three days, and her first dose of Rituxan was given on 7/30. The following days we saw significant clinical improvement in regards to hemoptysis, pulmonary infiltrates and dyspnea, but her creatinine was continuing to climb. Based on this we decided to stop diuresis as we felt that the infiltrates on film was due to the alveolar hemorrhage and not edema. We added back IVFs at that point as well. Over the following days we continued to see her creatinine improve and her respiratory status has improved to the  point where she can be discharged on 8/4     Discharge Plan by diagnoses  GPA  with ALveolar Hemorrhage. Relapse since 2013/2013. 11/02/12 PR-3 880. MPO negative. Likely - capillaritis. C/w Granulomatous Polyangitis (former Medical illustrator). Sepsis markers negative. Discharge Plan: > continue prednisone at current dose (101m): two to four weeks. If significant improvement is observed at this time, the dose of prednisone is tapered slowly, with the goal of reaching 20 mg/day by the end of two months.  > RITUXAN - chose Rituxan over cyclophosphamide due to malignancy history, and also this is technically a "relapse" or A "flare". Dose starting 11/03/12 will be Induction therapy with rituximab (375 mg/m2 per week for four weeks).  > cont PCP prophylaxis w/ - Bactrim 1 DS on M, W, F  Fib. Rate controlled  P:  Continue rate control and resume eliquis (renal fxn returning to baseline) We will check BMP on 8/6 to ensure renal fxn is improving. If not may need to talk to cards about dose reduction or alternative such as coumadin.   Microscopic Hematuria with new onset ARF 11/02/12  Recent Labs     11/06/12  0541  11/07/12  0610  CREATININE  2.32*  1.81*  P: Will need f/u creatinine have ordered lab work for 8/8   Anemia: improved P:  Has f/u with heme   Diabetes P: Resume home rx  Will need fasting glucose to see if she does well on oral meds.   Significant Hospital tests/ studies/ interventions and procedures  UC 7/31: neg  Sepsis markers negative  11/02/12: started pulsed steroids  11/03/12: Improved lung exam and stable hgb  7/31: no sig change in pulm exam.  GMB is negative   ANTIBIOTICS:  azith 7/28>>>7/29  Rocephin 7/28>>>7/29  Bactrim 7/30 (prophylaxis)>>>  Consults [facility]/ nephrology Nahser (cardiology)  Discharge Exam: BP 137/81  Pulse 74  Temp(Src) 97.6 F (36.4 C) (Oral)  Resp 20  Ht _0  (1.702 m)  Wt 92 kg (202 lb 13.2 oz)  BMI 31.76 kg/m2  SpO2 100% 2.5 liters  PHYSICAL EXAMINATION:  General: Obese female no distress seated comfortably in  the bed  Neuro: Alert and oriented x3, speech normal, moves all 4 extremities.  HEENT: Pupils equal and reactive to light. No cough. Neck is supple no neck nodes  Cardiovascular: Irregularly irregular but not tachycardic. No murmurs  Lungs: No respiratory distress but bilateral crackles present  Abdomen: Obese, soft, nontender, no organomegaly, no tenderness  Musculoskeletal: No cyanosis, no clubbing, no pedal edema  Skin: Intact without any rashes   Labs at discharge Lab Results  Component Value Date   CREATININE 1.81* 11/07/2012   BUN 61* 11/07/2012   NA 133* 11/07/2012   K 3.7 11/07/2012   CL 96 11/07/2012   CO2 28 11/07/2012   Lab Results  Component Value Date   WBC 9.8 11/07/2012   HGB 8.8* 11/07/2012   HCT 26.7* 11/07/2012   MCV 93.0 11/07/2012   PLT 361 11/07/2012   Lab Results  Component Value Date   ALT 12 11/03/2012   AST 21 11/03/2012   ALKPHOS 93 11/03/2012   BILITOT 1.1 11/03/2012   Lab Results  Component Value Date   INR 1.96* 11/01/2012   INR 1.06 04/18/2011   INR 1.7* 10/25/2007   CBG (last 3)   Recent Labs  11/07/12 2147 11/08/12 0743 11/08/12 1153  GLUCAP 313* 159* 151*    Current radiology studies Dg Chest 2 View  11/08/2012   *RADIOLOGY  REPORT*  Clinical Data: Pulmonary hemorrhage  CHEST - 2 VIEW  Comparison: 11/05/2012  Findings: The cardiopericardial silhouette is enlarged. Interstitial markings are diffusely coarsened with chronic features.  No overt airspace pulmonary edema or focal lung consolidation.  No pleural effusion.  Continued interval improvement in bilateral airspace disease with right lung now essentially cleared only some residual airspace opacity persisting in the left lung.  IMPRESSION: Cardiomegaly with underlying chronic interstitial coarsening. Continued interval improvement in lung aeration.   Original Report Authenticated By: Misty Stanley, M.D.    Disposition:  01-Home or Self Care      Discharge Orders   Future Appointments Provider  Department Dept Phone   11/10/2012 9:30 AM [place] [phone]   11/10/2012 10:00 AM Vivien Rota, NP [place] [phone]   11/17/2012 10:30 AM Phyllis Ginger [place] [phone]   11/19/2012 3:30 PM Melvenia Needles, NP [facility] [phone]   11/24/2012 10:30 AM [facility] [phone]   11/30/2012 2:20 PM Liliane Shi, PA-C [place]) [phone]   12/15/2012 2:00 PM Brand Males, MD [facility] [phone]   01/14/2013 10:30 AM [name], MD [facility]   02/09/2013 10:30 AM Powell, MD [facility] [phone]   06/20/2013 9:00 AM [facility] [phone]   Patient should wear two piece clothing and wear no powder or deodorant. Patient should arrive 15 minutes early.   09/12/2013 10:00 AM [facility] [phone]   09/12/2013 11:00 AM [facility] [phone]   Patient to arrive 15 minutes prior to appointment time.   09/15/2013 9:00 AM Chauncey Cruel, MD [place] [phone]   Future Orders Complete By Expires     Diet - low sodium heart healthy  As directed     Increase activity slowly  As directed         Medication List    STOP taking these medications       Fluticasone-Salmeterol 250-50 MCG/DOSE Aepb  Commonly known as:  ADVAIR  Replaced by:  mometasone-formoterol 100-5 MCG/ACT Aero     SE-TAN PLUS 162-115.2-1 MG Caps      TAKE these medications       apixaban 5 MG Tabs tablet  Commonly known as:  ELIQUIS  Take 1 tablet (5 mg total) by mouth 2 (two) times daily.     AZOR 5-40 MG per tablet  Generic drug:   amLODipine-olmesartan  TAKE ONE TABLET BY MOUTH EVERY DAY     BYSTOLIC 10 MG tablet  Generic drug:  nebivolol  TAKE ONE TABLET BY MOUTH EVERY DAY     CALCIUM CITRATE PO  Take 1,200 mg by mouth daily.     cholecalciferol 1000 UNITS tablet  Commonly known as:  VITAMIN D  Take 3,000 Units by mouth daily.     diazepam 5 MG tablet  Commonly known as:  VALIUM  TAKE ONE TABLET BY MOUTH EVERY DAY AS NEEDED.     fluticasone 50 MCG/ACT nasal spray  Commonly known as:  FLONASE  Place 2 sprays into the nose daily.     gabapentin 100 MG capsule  Commonly known as:  NEURONTIN  Take 1 capsule (100 mg total)  by mouth 2 (two) times daily before a meal.     glipiZIDE 10 MG tablet  Commonly known as:  GLUCOTROL  TAKE ONE TABLET BY MOUTH TWICE DAILY     levothyroxine 125 MCG tablet  Commonly known as:  SYNTHROID, LEVOTHROID  TAKE ONE TABLET BY MOUTH EVERY DAY     loratadine 10 MG tablet  Commonly known as:  CLARITIN  Take 10 mg by mouth daily.     metFORMIN 500 MG 24 hr tablet  Commonly known as:  GLUCOPHAGE-XR  TAKE TWO TABLETS BY MOUTH TWICE DAILY     mometasone-formoterol 100-5 MCG/ACT Aero  Commonly known as:  DULERA  Inhale 2 puffs into the lungs 2 (two) times daily.     multivitamin 3-35-2 MG Tabs tablet  TAKE ONE TABLET BY MOUTH EVERY DAY     ondansetron 4 MG tablet  Commonly known as:  ZOFRAN  Take 4 mg by mouth every 8 (eight) hours as needed for nausea.     predniSONE 20 MG tablet  Commonly known as:  DELTASONE  Take 3 tablets (60 mg total) by mouth daily with breakfast.     rosuvastatin 5 MG tablet  Commonly known as:  CRESTOR  Take 1 tablet (5 mg total) by mouth daily.     sulfamethoxazole-trimethoprim 800-160 MG per tablet  Commonly known as:  BACTRIM DS  1 tab three times a week. (Monday, Wednesday and Friday)     triamcinolone cream 0.1 %  Commonly known as:  KENALOG  Apply 1 application topically 2 (two) times daily. Compound 1:1 with Eucerin cream      VITAMIN B-6 PO  Take 100 mg by mouth daily.       Follow-up Information   Follow up with Ailene Ards, NP On 11/10/2012. (930am at the cancer center )    Contact information:   [address] [address] [phone]       Follow up with PARRETT,TAMMY, NP On 11/19/2012. (report at 330)    Contact information:   [address] [address] [phone]       Follow up with [facility], MD On 12/15/2012. (at 2pm )    Contact information:   [address] [place] [address] [phone]       Follow up with Sol Blazing, MD. (the office will call you )    Contact information:   [address] [place] [address] [phone]       Follow up with Richardson Dopp, PA-C On 11/30/2012. (220)    Contact information:   [address] [place] [address] [phone]       Follow up with [facility] On 11/12/2012. (report any time for lab work )         Signed: Marni Griffon 11/08/2012, 3:08 PM  Discharged Condition: fair  Physician Statement:   The Patient was personally examined, the discharge assessment and plan has been personally reviewed and I agree with ACNP Babcock's assessment and plan. > 30 minutes of time have been dedicated to discharge assessment, planning and discharge instructions.  Strother Everitt V.

## 2012-11-10 ENCOUNTER — Ambulatory Visit (HOSPITAL_BASED_OUTPATIENT_CLINIC_OR_DEPARTMENT_OTHER): Payer: Medicare Other | Admitting: Family

## 2012-11-10 ENCOUNTER — Telehealth: Payer: Self-pay | Admitting: Oncology

## 2012-11-10 ENCOUNTER — Encounter: Payer: Self-pay | Admitting: Family

## 2012-11-10 ENCOUNTER — Telehealth: Payer: Self-pay | Admitting: Family

## 2012-11-10 ENCOUNTER — Other Ambulatory Visit (HOSPITAL_BASED_OUTPATIENT_CLINIC_OR_DEPARTMENT_OTHER): Payer: Medicare Other | Admitting: Lab

## 2012-11-10 DIAGNOSIS — Z853 Personal history of malignant neoplasm of breast: Secondary | ICD-10-CM

## 2012-11-10 DIAGNOSIS — C50919 Malignant neoplasm of unspecified site of unspecified female breast: Secondary | ICD-10-CM

## 2012-11-10 DIAGNOSIS — M3130 Wegener's granulomatosis without renal involvement: Secondary | ICD-10-CM | POA: Diagnosis not present

## 2012-11-10 LAB — CBC WITH DIFFERENTIAL/PLATELET
Basophils Absolute: 0 10*3/uL (ref 0.0–0.1)
Eosinophils Absolute: 0.1 10*3/uL (ref 0.0–0.5)
HGB: 9.1 g/dL — ABNORMAL LOW (ref 11.6–15.9)
MCV: 91.5 fL (ref 79.5–101.0)
MONO#: 0.9 10*3/uL (ref 0.1–0.9)
NEUT#: 13.4 10*3/uL — ABNORMAL HIGH (ref 1.5–6.5)
RBC: 3.07 10*6/uL — ABNORMAL LOW (ref 3.70–5.45)
RDW: 13.7 % (ref 11.2–14.5)
WBC: 15.3 10*3/uL — ABNORMAL HIGH (ref 3.9–10.3)

## 2012-11-10 LAB — COMPREHENSIVE METABOLIC PANEL (CC13)
Albumin: 3 g/dL — ABNORMAL LOW (ref 3.5–5.0)
BUN: 40 mg/dL — ABNORMAL HIGH (ref 7.0–26.0)
CO2: 26 mEq/L (ref 22–29)
Calcium: 9.2 mg/dL (ref 8.4–10.4)
Chloride: 99 mEq/L (ref 98–109)
Glucose: 218 mg/dl — ABNORMAL HIGH (ref 70–140)
Potassium: 3.6 mEq/L (ref 3.5–5.1)
Sodium: 135 mEq/L — ABNORMAL LOW (ref 136–145)
Total Protein: 7 g/dL (ref 6.4–8.3)

## 2012-11-10 NOTE — Patient Instructions (Addendum)
Please contact us at [phone] if you have any questions or concerns.  Get plenty of rest, drink plenty of water, exercise daily (walking as tolerated) (upper body exercises), eat a balanced diet.  Results for orders placed in visit on 11/10/12 (from the past 24 hour(s))  CBC WITH DIFFERENTIAL     Status: Abnormal   Collection Time    11/10/12  9:05 AM      Result Value Range   WBC 15.3 (*) 3.9 - 10.3 10e3/uL   NEUT# 13.4 (*) 1.5 - 6.5 10e3/uL   HGB 9.1 (*) 11.6 - 15.9 g/dL   HCT 28.1 (*) 34.8 - 46.6 %   Platelets 327  145 - 400 10e3/uL   MCV 91.5  79.5 - 101.0 fL   MCH 29.6  25.1 - 34.0 pg   MCHC 32.4  31.5 - 36.0 g/dL   RBC 3.07 (*) 3.70 - 5.45 10e6/uL   RDW 13.7  11.2 - 14.5 %   lymph# 1.0  0.9 - 3.3 10e3/uL   MONO# 0.9  0.1 - 0.9 10e3/uL   Eosinophils Absolute 0.1  0.0 - 0.5 10e3/uL   Basophils Absolute 0.0  0.0 - 0.1 10e3/uL   NEUT% 86.9 (*) 38.4 - 76.8 %   LYMPH% 6.4 (*) 14.0 - 49.7 %   MONO% 5.9  0.0 - 14.0 %   EOS% 0.7  0.0 - 7.0 %   BASO% 0.1  0.0 - 2.0 %   Narrative:    Performed At:  [facility].               [place], [place] [address]  COMPREHENSIVE METABOLIC PANEL (VQ25)     Status: Abnormal   Collection Time    11/10/12  9:06 AM      Result Value Range   Sodium 135 (*) 136 - 145 mEq/L   Potassium 3.6  3.5 - 5.1 mEq/L   Chloride 99  98 - 109 mEq/L   CO2 26  22 - 29 mEq/L   Glucose 218 (*) 70 - 140 mg/dl   BUN 40.0 (*) 7.0 - 26.0 mg/dL   Creatinine 1.5 (*) 0.6 - 1.1 mg/dL   Total Bilirubin 1.21 (*) 0.20 - 1.20 mg/dL   Alkaline Phosphatase 63  40 - 150 U/L   AST 16  5 - 34 U/L   ALT 13  0 - 55 U/L   Total Protein 7.0  6.4 - 8.3 g/dL   Albumin 3.0 (*) 3.5 - 5.0 g/dL   Calcium 9.2  8.4 - 10.4 mg/dL   Narrative:    Note: New Reference ranges.Performed At:  [facility].               [place], [place] [address]

## 2012-11-10 NOTE — Telephone Encounter (Signed)
,

## 2012-11-10 NOTE — Telephone Encounter (Signed)
[name] [name]

## 2012-11-10 NOTE — Progress Notes (Addendum)
[facility]  [phone] [phone]  OFFICE PROGRESS NOTE   ID: MARVIA TROOST   DOB: 07-04-1932  MR#: [id number]  ENI#:[id number]   PCP: [name], MD GYN: Juanda Chance, WHNP-C SU: Alphonsa Overall, MD RAD ONC: Rexene Edison, MD CARDIO: Peter M.  Martinique, MD PULM: Tarri Fuller D. Young, MD   HISTORY OF PRESENT ILLNESS: From Dr. Collier Salina Rubin's new patient evaluation note dated 10/01/2005: "This is a 79 year old woman referred by Dr. Lucia Gaskins for evaluation and treatment of breast cancer.  This woman undergoes annual screening mammography.  She underwent initial mammogram on 08/06/2005.  This showed a suspicious mass in the left breast.  A diagnostic mammogram performed on 08/19/2005 showed a 5 x 4 x 4 mm spiculated mass with microcalfication of the left anterior upper outer quadrant at 1 o'clock.  No palpable mass was identified.  Ultrasound showed hypoechoic mass with some shadowing and microcalcifications.  Biopsy was performed on 08/19/2005 and this showed an invasive mammary carcinoma intermediate grade, ER/PR positive at 94% and 86% respectively, proliferative index 9%, HER-2/neu was 2+ with no overexpression by FISH.  No polysomy was seen with adjuvant in her chromosome 17 of 3.  MRI scan of both breasts was performed showing a solitary abnormality in the left breast and right breast was normal.  The patient elected to undergo lumpectomy and sentinel lymph node evaluation on 09/09/2005.  The final pathology showed a 0.9 cm grade 3 of 3 invasive ductal cancer.  Lymphovascular invasion was not seen though lymphovascular invasion was identified in the original biopsy.  The solitary lymph node, which was identified, was negative for malignancy.  She has had an unremarkable postoperative course."  Her subsequent history is as detailed below.   INTERVAL HISTORY: Dr. Jana Hakim and I saw Dionicio Stall today for follow up of recent hospitalization for granulomatous  polyangitis (Wegener's disease) and pneumonia from 11/01/2012 through 11/07/2012.  It will be recalled that Mrs. Mcconnell has a history of invasive ductal carcinoma with ductal carcinoma in situ of the left breast and possible neuroendocrine type lung carcinoma.  The patient is accompanied to today's office visit by her husband Nadiah Corbit.  The patient was last seen by [place] on 09/14/2012.     REVIEW OF SYSTEMS: A 10 point review of systems was completed and is significant for faint expiratory wheezing during today's office visit.  The patient is sitting in a wheelchair for her office visit today and visibly appears ill.  Mrs. Santoli denies any epistaxis or unusual bleeding/bruising, she states that she has an appetite and is eating a balanced diet, and her bowel movements/urinary habits are regular.  The patient  has an ongoing history of vertigo which is controlled when she takes Valium.  Mrs. Casimir denies any other symptomatology and her review of systems is otherwise noncontributory.   PAST MEDICAL HISTORY: Past Medical History  Diagnosis Date  . HYPOTHYROIDISM 07/24/2008  . HYPERLIPIDEMIA 07/24/2008  . DEPRESSION 05/16/2009  . HYPERTENSION 07/24/2008  . Vertigo   . Diabetes mellitus type II   . Arthritis     r tkr  . CVA (cerebral vascular accident) 2008    mini stroke.no residual  . Heart murmur     stress test 2009.dr peter Martinique  . Intermittent vertigo   . Skin abnormality     facial lesions .pt applying mupiracin to areas  . Atrial fibrillation   . Breast cancer   . Pneumonia   . Wegener's disease, pulmonary  10/2012    PAST SURGICAL HISTORY: Past Surgical History  Procedure Laterality Date  . Knee surgery  2009    TKR  . Cholecystectomy  1980  . Joint replacement      r knee  . Breast surgery  2007    Lumpectomy, XRT 2006.l breast  . Video assisted thoracoscopy  04/22/2011    Procedure: VIDEO ASSISTED THORACOSCOPY;  Surgeon: Melrose Nakayama, MD;  Location: [facility];   Service: Thoracic;  Laterality: Left;  WITH BIOPSY  Include cholecystectomy with exploratory laparotomy, and right heel surgery.   FAMILY HISTORY Family History  Problem Relation Age of Onset  . Arthritis Mother   . Rheum arthritis Mother   . Heart disease Father   . Coronary artery disease Father   . Cancer Sister     breast CA, both sisters  . Breast cancer Sister   . Coronary artery disease Brother   . Lung cancer Brother   Is significant for parents both deceased.  She has had one brother who died of lung cancer.  She has had two sisters with breast cancer, one dying at age 68.  She has had one diagnosed at age 53 and it recurred 11 years later.  She had one brother who died of pancreatic cancer as well.   GYNECOLOGIC HISTORY: She is gravida 3, para 3.    Menarche age 65 and menopause in her 21s.  She was on hormone replacement therapy with Premarin, Provera for 15-20 years.  SOCIAL HISTORY: Mr. and Mrs. Doby have been married since 26.  They have 3 adult sons, 7 grandchildren, and 6 great-grandchildren.   She has retired from [employer] as a Administrator.  Her husband is also retired from [employer] in 1996. In her spare time the patient enjoys cooking/baking and reading.    ADVANCED DIRECTIVES: Not on file   HEALTH MAINTENANCE: History  Substance Use Topics  . Smoking status: Former Smoker -- 0.50 packs/day for 12 years    Types: Cigarettes    Quit date: 06/04/1982  . Smokeless tobacco: Never Used  . Alcohol Use: No    Colonoscopy: 05/05/2003 PAP: Not on file Bone density: The patient's last bone density scan on 10/16/2011 showed a T score of -1.8 (osteopenia). Lipid panel: Not on file   Allergies  Allergen Reactions  . Codeine Sulfate     REACTION: GI upset  . Sulfonamide Derivatives     REACTION: GI upset  . Atarax (Hydroxyzine) Nausea Only and Rash    Current Outpatient Prescriptions  Medication Sig Dispense Refill  .  apixaban (ELIQUIS) 5 MG TABS tablet Take 1 tablet (5 mg total) by mouth 2 (two) times daily.  60 tablet  6  . AZOR 5-40 MG per tablet TAKE ONE TABLET BY MOUTH EVERY DAY  90 tablet  3  . BYSTOLIC 10 MG tablet TAKE ONE TABLET BY MOUTH EVERY DAY  90 tablet  3  . CALCIUM CITRATE PO Take 1,200 mg by mouth daily.        . cholecalciferol (VITAMIN D) 1000 UNITS tablet Take 3,000 Units by mouth daily.        . diazepam (VALIUM) 5 MG tablet TAKE ONE TABLET BY MOUTH EVERY DAY AS NEEDED.  30 tablet  0  . fluticasone (FLONASE) 50 MCG/ACT nasal spray Place 2 sprays into the nose daily.  16 g  6  . gabapentin (NEURONTIN) 100 MG capsule Take 1 capsule (100 mg total) by mouth  2 (two) times daily before a meal.  60 capsule  6  . glipiZIDE (GLUCOTROL) 10 MG tablet TAKE ONE TABLET BY MOUTH TWICE DAILY  180 tablet  3  . levothyroxine (SYNTHROID, LEVOTHROID) 125 MCG tablet TAKE ONE TABLET BY MOUTH EVERY DAY  90 tablet  3  . loratadine (CLARITIN) 10 MG tablet Take 10 mg by mouth daily.      . metFORMIN (GLUCOPHAGE-XR) 500 MG 24 hr tablet TAKE TWO TABLETS BY MOUTH TWICE DAILY  120 tablet  11  . mometasone-formoterol (DULERA) 100-5 MCG/ACT AERO Inhale 2 puffs into the lungs 2 (two) times daily.  1 Inhaler  6  . multivitamin (METANX) 3-35-2 MG TABS tablet TAKE ONE TABLET BY MOUTH EVERY DAY  90 tablet  3  . ondansetron (ZOFRAN) 4 MG tablet Take 4 mg by mouth every 8 (eight) hours as needed for nausea.      . predniSONE (DELTASONE) 20 MG tablet Take 3 tablets (60 mg total) by mouth daily with breakfast.  90 tablet  3  . Pyridoxine HCl (VITAMIN B-6 PO) Take 100 mg by mouth daily.       . rosuvastatin (CRESTOR) 5 MG tablet Take 1 tablet (5 mg total) by mouth daily.  30 tablet  11  . sulfamethoxazole-trimethoprim (BACTRIM DS) 800-160 MG per tablet 1 tab three times a week. (Monday, Wednesday and Friday)  15 tablet  6  . triamcinolone cream (KENALOG) 0.1 % Apply 1 application topically 2 (two) times daily. Compound 1:1 with  Eucerin cream       No current facility-administered medications for this visit.    OBJECTIVE: Filed Vitals:   11/10/12 1017  BP: 187/88  Pulse: 77  Temp: 98.3 F (36.8 C)  Resp: 20     Body mass index is 31.74 kg/(m^2).     Oxygen saturation 96% on room air  ECOG FS: 2 - Symptomatic, <50% confined to bed  General appearance: Alert, cooperative, well nourished, visibly ill, in a wheelchair (due to vertigo) Head: Normocephalic, without obvious abnormality, atraumatic Eyes: Arcus senilis, PERRLA, EOMI Nose: Nares, septum and mucosa are normal, no drainage or sinus tenderness Neck: No adenopathy, supple, symmetrical, trachea midline, thyroid not enlarged, no tenderness Resp: Bilateral expiratory wheezes, diminished bibasilar breath sounds Cardio: Irregularly irregular, no murmur, no click, rub or gallop Breasts: Deferred GI: Soft, distended, non-tender, hypoactive bowel sounds, no organomegaly Skin: Bilateral upper extremity ecchymoses in various stages of healing Extremities: Extremities normal, atraumatic, no cyanosis or edema Lymph nodes: Cervical, supraclavicular, and axillary nodes normal Neurologic: Grossly normal    LAB RESULTS: Lab Results  Component Value Date   WBC 15.3* 11/10/2012   NEUTROABS 13.4* 11/10/2012   HGB 9.1* 11/10/2012   HCT 28.1* 11/10/2012   MCV 91.5 11/10/2012   PLT 327 11/10/2012      Chemistry      Component Value Date/Time   NA 135* 11/10/2012 0906   NA 133* 11/07/2012 0610   NA 136 09/09/2011 0939   K 3.6 11/10/2012 0906   K 3.7 11/07/2012 0610   K 4.7 09/09/2011 0939   CL 96 11/07/2012 0610   CL 98 08/02/2012 0824   CL 95* 09/09/2011 0939   CO2 26 11/10/2012 0906   CO2 28 11/07/2012 0610   CO2 29 09/09/2011 0939   BUN 40.0* 11/10/2012 0906   BUN 61* 11/07/2012 0610   BUN 17 09/09/2011 0939   CREATININE 1.5* 11/10/2012 0906   CREATININE 1.81* 11/07/2012 0610   CREATININE 1.0 09/09/2011  9735      Component Value Date/Time   CALCIUM 9.2 11/10/2012 0906   CALCIUM 8.8  11/07/2012 0610   CALCIUM 9.0 09/09/2011 0939   ALKPHOS 63 11/10/2012 0906   ALKPHOS 93 11/03/2012 0310   ALKPHOS 45 09/09/2011 0939   AST 16 11/10/2012 0906   AST 21 11/03/2012 0310   AST 26 09/09/2011 0939   ALT 13 11/10/2012 0906   ALT 12 11/03/2012 0310   ALT 19 09/09/2011 0939   BILITOT 1.21* 11/10/2012 0906   BILITOT 1.1 11/03/2012 0310   BILITOT 0.60 09/09/2011 0939      Lab Results  Component Value Date   LABCA2 28 09/09/2011    Urinalysis    Component Value Date/Time   COLORURINE AMBER* 11/02/2012 2113   APPEARANCEUR CLOUDY* 11/02/2012 2113   LABSPEC 1.026 11/02/2012 2113   PHURINE 5.5 11/02/2012 2113   GLUCOSEU NEGATIVE 11/02/2012 2113   HGBUR LARGE* 11/02/2012 2113   BILIRUBINUR SMALL* 11/02/2012 2113   BILIRUBINUR n 12/30/2010 1700   KETONESUR NEGATIVE 11/02/2012 2113   PROTEINUR 100* 11/02/2012 2113   UROBILINOGEN 1.0 11/02/2012 2113   UROBILINOGEN 0.2 12/30/2010 1700   NITRITE NEGATIVE 11/02/2012 2113   NITRITE n 12/30/2010 1700   LEUKOCYTESUR NEGATIVE 11/02/2012 2113    STUDIES: 1.  The patient's last bilateral digital diagnostic mammogram on 06/17/2012 showed a magnification view of the lumpectomy site and routine views of both breasts again demonstrate scarring within the upper outer left breast. There is no evidence of suspicious mass, nonsurgical distortion or worrisome calcifications bilaterally.  Benign findings.  2.  Dg Chest 2 View 11/01/2012   *RADIOLOGY REPORT*  Clinical Data: Hemoptysis, cough, wheezing, hypertension, breast cancer  CHEST - 2 VIEW  Comparison: 06/20/2011 and 08/02/2012  Findings: Cardiomegaly again noted.  Atherosclerotic calcifications of thoracic aorta.  Old left upper rib fractures are again noted. There is patchy perihilar airspace disease bilaterally more confluent in the right midlung.  Bilateral multifocal pneumonia cannot be excluded.  Less likely multifocal interstitial tumor spread.  Clinical correlation is necessary.  Further evaluation with CT scan of the  chest is recommended.  IMPRESSION: There is patchy perihilar airspace disease bilaterally more confluent in the right midlung.  Bilateral multifocal pneumonia cannot be excluded.  Less likely multifocal interstitial tumor spread.  Clinical correlation is necessary.  Further evaluation with CT scan of the chest is recommended.   Original Report Authenticated By: Lahoma Crocker, M.D.    3.  Ct Angio Chest Pe W/cm &/or Wo Cm 11/01/2012   *RADIOLOGY REPORT*  Clinical Data: Hemoptysis, shortness of breath, history of breast cancer diagnosed 2007 status post left lumpectomy and radiation therapy  CT ANGIOGRAPHY CHEST  Technique:  Multidetector CT imaging of the chest using the standard protocol during bolus administration of intravenous contrast. Multiplanar reconstructed images including MIPs were obtained and reviewed to evaluate the vascular anatomy.  Contrast: 165m OMNIPAQUE IOHEXOL 350 MG/ML SOLN  Comparison: Chest radiograph 11/01/2012, CT 08/02/2012  Findings: The study is of adequate technical quality for evaluation for pulmonary embolism up to and including the 3rd order pulmonary arteries. No focal filling defect is seen to suggest acute pulmonary embolism.  Left breast lumpectomy changes are noted.  Heart size is moderately enlarged.  Moderate low density pericardial effusion is present. No pleural effusion identified.  Moderate atheromatous aortic calcification without aneurysm.  Small pretracheal nodes are re- identified but slightly increased since previously; representative 0.9 cm pretracheal node identified image 25, previously 0.4 cm at the  same anatomic level.  There are multiple new patchy areas of ground-glass opacity alveolar consolidation with air bronchogram formation.  The distribution is predominately central but there are areas that are relatively more peripheral, likely reflecting contiguous spread. Central airways are patent.  Some areas of pulmonary consolidation have a relatively more nodular  appearance, for example at the lung bases measuring 1 cm left lower lobe image 63 and 0.7 cm right lower lobe image 63, respectively.  However, no discrete solid appearing pulmonary nodularity is otherwise identifiable.  Remote deformity of the left lateral fifth and sixth ribs re- identified.  No new lytic or sclerotic osseous lesion.  IMPRESSION: New multilobar confluent alveolar airspace consolidation with air bronchogram formation.  Some areas demonstrate some nodularity but no other dominant pulmonary nodule or mass is identified.  Findings are most consistent with alveolar filling processes such as hemorrhage, pus/pneumonia, protein, or cells such as with metastatic disease.  Mild mediastinal lymphadenopathy with could be reactive however metastatic disease could also have this appearance.  Moderate pericardial effusion, increased since previous exam.  Cardiomegaly.  No CT evidence for acute pulmonary embolism.   Original Report Authenticated By: Conchita Paris, M.D.   4.  Dg Chest Port 1 View 11/02/2012   *RADIOLOGY REPORT*  Clinical Data: Shortness of breath  PORTABLE CHEST - 1 VIEW  Comparison: 11/01/2012  Findings: Patchy infiltrates are again identified throughout both lungs.  Given some technical variations of the film the overall appearance is stable.  The cardiac shadow remains enlarged.  No new focal abnormality is seen.  IMPRESSION: No change from prior exam   Original Report Authenticated By: Inez Catalina, M.D.     ASSESSMENT: Mrs. Osburn is a 79 y.o. [place], [place] woman:  1.  Status post left breast lumpectomy with left axillary sentinel node biopsy on 09/09/2005 for a, stage I, pT1b pN0 (i-) (sn), 0.9 cm invasive ductal carcinoma grade 3, with high-grade ductal carcinoma in situ, estrogen receptor 94% positive, progesterone receptor 86% positive, Ki-67 9%, HER-2/neu 2+ by FISH H. 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  2.   Oncotype DX report dated 10/13/2005 showed a recurrent score of 19 with average rate of distant recurrence at 12%.  3.  Status post radiation therapy from 11/11/2005 through 12/26/2005.  4.  The patient started antiestrogen therapy with Arimidex in 01/2006.  The patient also started Fosamax at that time.  She completed antiestrogen therapy in 01/2011.  5.  The patient was being treated by her primary care physician for pneumonia in 04/2011. She was subsequently seen by a pulmonologist who in turn was concerned about Wegener's disease. He referred her for a lung biopsy. VATS lung biopsy was performed by Dr. Roxan Hockey on 04/22/2011. The patient had a PET scan on 05/21/2011 which showed no clear evidence of malignancy.  Uptake along the left lateral chest wall was likely related to surgery.  Interval improvement of airspace disease within the lungs.  No evidence of malignancy within the abdomen or pelvis.    6.  Dr. Truddie Coco noted that the patient did have evidence of atypical glandular cells in her pathology from the lung biopsy.  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  Her last CT of the chest with contrast on 08/02/2012 showed an interval persistence but decrease in size of the ground-glass nodules seen in the anterior left upper lobe.  Adenocarcinoma cannot be excluded.  Followup by CT is recommended in 12  months, with continued annual surveillance for a minimum of 3 years.  7.  Hospitalization from 11/01/2012 through 11/07/2012 for pneumonia and granulomatous polyangiitis (Wegener's disease).  8.  Steroid-induced hyperglycemia and leukocytosis.  PLAN: Dr. Jana Hakim spoke to the patient and her husband at length concerning immunotherapy with Rituxan x 4 weekly doses for her newly diagnosed Wegener's disease.  She will begin weekly Rituxan infusions starting next week on 11/17/2012 continuing weekly for 4 weeks through 12/08/2012.  All of  Mrs. Harpenau questions were  answered regarding Rituximab infusions including no hair loss and no post immunotherapy nausea/vomiting.  Ms. Mcclay is scheduled to attend a chemotherapy class on 11/15/2012 prior to her first immunotherapy infusion.  Laboratories of CBC and CMP will be checked prior to each immunotherapy infusion.  We plan to see her  for an office visit after her first immunotherapy infusion and prior to her second immunotherapy infusion on 11/24/2012.    Ms. Mcgann is currently on a slow prednisone taper with the goal of reaching 20 mg per day by the end of 2 months per Dr. Elsworth Soho, Pulmonologist.  Subsequently, she has steroid-induced hyperglycemia and leukocytosis.  The patient has been instructed how to adjust her diabetes medications for prednisone taper.  With regards to her breast cancer, we still plan to see the patient again in one year (09/2013).  We will check laboratories of CBC, CMP, LDH, and vitamin D level at that time.  Mrs. Rumer is scheduled for her annual bilateral digital diagnostic mammogram in 06/2013.  She is also scheduled to have a follow up CT of the chest with contrast in 09/2013.  Her last CT of the chest with contrast on 08/02/2012 recommended annual surveillance of the nodules seen in the anterior left upper lobe for a minimum of 3 years.  .  All questions were answered.  The patient and her husband were encouraged to contact us in the interim with any problems, questions or concerns.   Ailene Ards, NP-C 11/10/2012, 5:16 PM   ADDENDUM: This 79 year old [place] woman establish herself in my practice today. I gave her a written account of her diagnosis and treatment plan. In brief: She underwent a left lumpectomy and sentinel lymph node sampling June of 2007 for a pT1b pN0, stage IA invasive ductal carcinoma, grade 3, strongly estrogen and progesterone receptor positive, with a low proliferation fraction and no HER-2 amplification. Her Oncotype DX predicted a 12% risk of distant  recurrence within 10 years if her only adjuvant treatment was tamoxifen for 5 years.  She completed radiation treatments September of 2007 and started Arimidex in October of 2007, completing 5 years October of 2012. She was also treated with Fosamax because of concerns regarding osteoporosis with aromatase inhibitors.  She understands that 5 years of aromatase inhibitor is superior to 5 years of tamoxifen in terms of risk reduction, by 2-3%. According to her final risk of this cancer coming back we'll be less than 10%.  We could have released the patient from followup here at this point, except for the fact that in January of 2013 she was found to have suspicious changes in her lung and underwent VATS under Cecille Rubin, the biopsy showing mostly chronic interstitial pneumonia, but with "focal atypical glands" noted by the consulting pathologist ([id number]). Accordingly the patient is being followed closely under Clint Young and just had a restaging chest CT April of this year showing persistence of the groundglass nodules in the left upper lobe, but actual decrease in  size.  Accordingly we will see the patient one more time, a year from now, after her next chest CT scan. It that study is stable or shows further improvement, we will release her from followup here. Luda has a good understanding of this plan and is very much in agreement. She knows to call for any other problems that may develop before her next visit here.  I personally saw this patient and performed a substantive portion of this encounter with the listed APP documented above.   Lurline Del, MD

## 2012-11-11 ENCOUNTER — Other Ambulatory Visit: Payer: Self-pay | Admitting: Family Medicine

## 2012-11-11 ENCOUNTER — Other Ambulatory Visit: Payer: Self-pay | Admitting: Family

## 2012-11-11 ENCOUNTER — Telehealth: Payer: Self-pay | Admitting: *Deleted

## 2012-11-11 LAB — ALDOLASE: Aldolase: 5.8 U/L (ref <=8.1)

## 2012-11-11 NOTE — Telephone Encounter (Signed)
Last visit 10/27/12 07/26/12 last refill #30 no refills

## 2012-11-11 NOTE — Telephone Encounter (Signed)
sw pt informed her that 11/17/12 appts has been rs to 11/24/12. gv appt for 11/24/12 w/labs @ 9:30, ov@ 10 am, and tx follow. i emailed MB to see if her tx needs to be adjusted for 11/24/12...td

## 2012-11-11 NOTE — Progress Notes (Signed)
We were contacted by [name] [name]' pulmonologists regarding her new diagnosis of Wegener's. They requested that we facilitate her treatment with Rituxan. Today we discussed the possible toxicities, side effects, and complications of that agent. The patient understands the overall purpose of the treatment plan, which is control, and is very much in agreement with it. In addition the patient has been scheduled to meet him "chemotherapy school". We are proceeding with 4 weekly doses of right toxin beginning August 15. Orders and followup appointments have been entered.  ADDENDUM: The patient's hepatitis B serologies show  Results for [name], [name] (MRN [id number]) as of 11/11/2012 07:07  Ref. Range 11/03/2012 03:10  Hepatitis B Surface Ag Latest Range: NEGATIVE  NEGATIVE  Hep B S Ab Latest Range: NEGATIVE  NEGATIVE  Hep B Core Total Ab Latest Range: NEGATIVE  POSITIVE (A)  Hep B E Ab Latest Range: Negative  Negative   This may be a false positive, may indicate an early antibody respose to recent Hep B infection, may indicate resolved Hep B infection with waning titers, or may indicate a chronic low-level infection. We will obtain Hep B core IgG and IgM titers and viral RNA titers to evaluate further. We will proceed with treatment as planned and follow transaminases (which are normal).

## 2012-11-12 ENCOUNTER — Other Ambulatory Visit: Payer: Self-pay | Admitting: Family

## 2012-11-12 DIAGNOSIS — M3130 Wegener's granulomatosis without renal involvement: Secondary | ICD-10-CM

## 2012-11-12 NOTE — Telephone Encounter (Signed)
Refill once 

## 2012-11-14 ENCOUNTER — Telehealth: Payer: Self-pay | Admitting: Internal Medicine

## 2012-11-14 NOTE — Telephone Encounter (Signed)
[name]  Want to make sure this patient [name] [name] got her 2nd rituxan on 11/10/12. The first dose was given 11/03/12 and is once a week x 4 weeks since that date as I had discussed with you on the phone. The dose for this disease is 375m/m2.   Reading the notes it [facility] She already got the first dose on 11/03/12  Thanks  Dr. MBrand Males M.D., FSt. Elizabeth HospitalC.P Pulmonary and Critical Care Medicine Staff Physician CGadsdenPulmonary and Critical Care Pager: [phone] If no answer or between  15:00h - 7:00h: call [phone]  11/14/2012 8:36 PM

## 2012-11-15 ENCOUNTER — Telehealth: Payer: Self-pay | Admitting: *Deleted

## 2012-11-15 ENCOUNTER — Telehealth: Payer: Self-pay | Admitting: Oncology

## 2012-11-15 ENCOUNTER — Other Ambulatory Visit: Payer: Medicare Other

## 2012-11-15 NOTE — Telephone Encounter (Signed)
Per staff phone call and POF I have schedueld appts.  JMW

## 2012-11-15 NOTE — Telephone Encounter (Signed)
[name] [name]

## 2012-11-17 ENCOUNTER — Other Ambulatory Visit (HOSPITAL_BASED_OUTPATIENT_CLINIC_OR_DEPARTMENT_OTHER): Payer: Medicare Other | Admitting: Lab

## 2012-11-17 ENCOUNTER — Ambulatory Visit: Payer: Medicare Other

## 2012-11-17 ENCOUNTER — Other Ambulatory Visit: Payer: Medicare Other | Admitting: Lab

## 2012-11-17 ENCOUNTER — Ambulatory Visit (HOSPITAL_BASED_OUTPATIENT_CLINIC_OR_DEPARTMENT_OTHER): Payer: Medicare Other

## 2012-11-17 ENCOUNTER — Ambulatory Visit: Payer: Medicare Other | Admitting: Lab

## 2012-11-17 ENCOUNTER — Ambulatory Visit: Payer: Medicare Other | Admitting: Family

## 2012-11-17 ENCOUNTER — Other Ambulatory Visit: Payer: Self-pay | Admitting: *Deleted

## 2012-11-17 VITALS — BP 147/59 | HR 77 | Temp 97.7°F | Resp 18

## 2012-11-17 DIAGNOSIS — B1910 Unspecified viral hepatitis B without hepatic coma: Secondary | ICD-10-CM | POA: Diagnosis not present

## 2012-11-17 DIAGNOSIS — Z5112 Encounter for antineoplastic immunotherapy: Secondary | ICD-10-CM | POA: Diagnosis not present

## 2012-11-17 DIAGNOSIS — M3130 Wegener's granulomatosis without renal involvement: Secondary | ICD-10-CM

## 2012-11-17 DIAGNOSIS — C50919 Malignant neoplasm of unspecified site of unspecified female breast: Secondary | ICD-10-CM

## 2012-11-17 LAB — COMPREHENSIVE METABOLIC PANEL (CC13)
ALT: 12 U/L (ref 0–55)
Alkaline Phosphatase: 49 U/L (ref 40–150)
CO2: 22 mEq/L (ref 22–29)
Creatinine: 1.6 mg/dL — ABNORMAL HIGH (ref 0.6–1.1)
Total Bilirubin: 1.19 mg/dL (ref 0.20–1.20)

## 2012-11-17 LAB — CBC WITH DIFFERENTIAL/PLATELET
EOS%: 0.8 % (ref 0.0–7.0)
Eosinophils Absolute: 0.1 10*3/uL (ref 0.0–0.5)
LYMPH%: 9.4 % — ABNORMAL LOW (ref 14.0–49.7)
MCH: 30.2 pg (ref 25.1–34.0)
MCHC: 32.7 g/dL (ref 31.5–36.0)
MCV: 92.2 fL (ref 79.5–101.0)
MONO%: 8.8 % (ref 0.0–14.0)
NEUT#: 10.6 10*3/uL — ABNORMAL HIGH (ref 1.5–6.5)
Platelets: 227 10*3/uL (ref 145–400)
RBC: 2.95 10*6/uL — ABNORMAL LOW (ref 3.70–5.45)
nRBC: 0 % (ref 0–0)

## 2012-11-17 MED ORDER — SODIUM CHLORIDE 0.9 % IV SOLN
Freq: Once | INTRAVENOUS | Status: AC
  Administered 2012-11-17: 10:00:00 via INTRAVENOUS

## 2012-11-17 MED ORDER — ACETAMINOPHEN 325 MG PO TABS
650.0000 mg | ORAL_TABLET | Freq: Once | ORAL | Status: AC
  Administered 2012-11-17: 650 mg via ORAL

## 2012-11-17 MED ORDER — SODIUM CHLORIDE 0.9 % IV SOLN
375.0000 mg/m2 | Freq: Once | INTRAVENOUS | Status: AC
  Administered 2012-11-17: 800 mg via INTRAVENOUS
  Filled 2012-11-17: qty 80

## 2012-11-17 MED ORDER — DEXAMETHASONE SODIUM PHOSPHATE 10 MG/ML IJ SOLN
10.0000 mg | Freq: Once | INTRAMUSCULAR | Status: AC
  Administered 2012-11-17: 10 mg via INTRAVENOUS

## 2012-11-17 MED ORDER — DIPHENHYDRAMINE HCL 25 MG PO CAPS
25.0000 mg | ORAL_CAPSULE | Freq: Once | ORAL | Status: AC
  Administered 2012-11-17: 25 mg via ORAL

## 2012-11-17 NOTE — Patient Instructions (Addendum)
[facility] Discharge Instructions for Patients Receiving Chemotherapy  Today you received the following chemotherapy agents Rituxan.  To help prevent nausea and vomiting after your treatment, we encourage you to take your nausea medication as prescribed.   If you develop nausea and vomiting that is not controlled by your nausea medication, call the clinic.   BELOW ARE SYMPTOMS THAT SHOULD BE REPORTED IMMEDIATELY:  *FEVER GREATER THAN 100.5 F  *CHILLS WITH OR WITHOUT FEVER  NAUSEA AND VOMITING THAT IS NOT CONTROLLED WITH YOUR NAUSEA MEDICATION  *UNUSUAL SHORTNESS OF BREATH  *UNUSUAL BRUISING OR BLEEDING  TENDERNESS IN MOUTH AND THROAT WITH OR WITHOUT PRESENCE OF ULCERS  *URINARY PROBLEMS  *BOWEL PROBLEMS  UNUSUAL RASH Items with * indicate a potential emergency and should be followed up as soon as possible.  Feel free to call the clinic you have any questions or concerns. The clinic phone number is [phone].

## 2012-11-18 ENCOUNTER — Telehealth: Payer: Self-pay | Admitting: *Deleted

## 2012-11-18 LAB — HEPATITIS C VRS RNA DETECT BY PCR-QUAL: Hepatitis C Vrs RNA by PCR-Qual: NEGATIVE

## 2012-11-18 LAB — HEPATITIS B CORE ANTIBODY, IGM: Hep B C IgM: NEGATIVE

## 2012-11-18 LAB — HEPATITIS B CORE ANTIBODY, TOTAL: Hep B Core Total Ab: POSITIVE — AB

## 2012-11-18 NOTE — Telephone Encounter (Signed)
NO PROBLEMS OF QUESTIONS AT THIS TIME. PT. IS EATING WELL AND FORCING FLUIDS. SHE HAS HER CHEMOTHERAPY INFORMATION SHEET TO REFER TO IF NECESSARY. PT. WILL CALL THIS OFFICE IF THE NEED ARISES. REMINDED PT. THAT THERE IS A PHYSICIAN ON CALL IF THE OFFICE IS CLOSED. SHE VOICES UNDERSTANDING.

## 2012-11-19 ENCOUNTER — Ambulatory Visit (INDEPENDENT_AMBULATORY_CARE_PROVIDER_SITE_OTHER)
Admission: RE | Admit: 2012-11-19 | Discharge: 2012-11-19 | Disposition: A | Discharge status: Home / Self Care | Payer: Medicare Other | Source: Ambulatory Visit | Attending: Adult Health | Admitting: Adult Health

## 2012-11-19 ENCOUNTER — Encounter: Payer: Self-pay | Admitting: Adult Health

## 2012-11-19 ENCOUNTER — Ambulatory Visit (INDEPENDENT_AMBULATORY_CARE_PROVIDER_SITE_OTHER): Payer: Medicare Other | Admitting: Adult Health

## 2012-11-19 VITALS — BP 134/76 | HR 75 | Temp 98.5°F | Ht 65.5 in | Wt 200.2 lb

## 2012-11-19 DIAGNOSIS — R042 Hemoptysis: Secondary | ICD-10-CM

## 2012-11-19 DIAGNOSIS — M3130 Wegener's granulomatosis without renal involvement: Secondary | ICD-10-CM

## 2012-11-19 DIAGNOSIS — J984 Other disorders of lung: Secondary | ICD-10-CM | POA: Diagnosis not present

## 2012-11-19 NOTE — Assessment & Plan Note (Signed)
Clinically improving  Will cont prednisone at 90m daily (total of 4 weeks ) then slow taper to 275mif improving at follow up in 2 weeks  Check cxr today  Plan   Continue on Prednisone 6021maily  Follow up Dr. YouAnnamaria Bootsn 2 weeks in office  Please contact office for sooner follow up if symptoms do not improve or worsen or seek emergency care

## 2012-11-19 NOTE — Patient Instructions (Addendum)
Chest xray today  Continue on Prednisone 39m daily  Follow up Dr. YAnnamaria Boots In 2 weeks in office  Please contact office for sooner follow up if symptoms do not improve or worsen or seek emergency care

## 2012-11-19 NOTE — Progress Notes (Signed)
04/03/11- 79 year old female former smoking history referred courtesy of Dr. Elease Hashimoto with concern of abnormal chest CT. Husband is an allergy patient here. Dr. Erick Blinks recent office note reviewed "Followup from recent pneumonia. Patient had presented here with extreme myalgias and chronic pain along with bilateral shoulder hip pain. Very high sedimentation rate of 120.  She almost total resolution of myalgias with low dose prednisone strongly suggesting polymyalgia rheumatica. She subsequently presented with cough with trace of blood but no dyspnea, fever, reported a pain. Chest x-ray showed bilateral infiltrates suggesting probable bilateral pneumonia the recommendation for CT scan. CT scan revealed no adenopathy and no evidence for a lung mass. Compatible with bilateral pneumonia. Patient placed on antibiotic and much better at this time with no fever and essentially no cough. Her muscle and joint pains are almost 100% resolved with low-dose prednisone. She did present back in September with severe sinus disease suggesting acute sinusitis. CT revealed no acute findings. She denies any arthralgias at this time. No skin rash. No fever." She has been on prednisone 10 mg twice daily since December 13 and feels significantly better. Morning cough has cleared with residual trace phlegm and trace blood. No chest pain. CT chest 03/23/2011 -images reviewed by me-bilateral patchy airspace disease with air bronchograms. No cavitation or definite nodularity seen. She has had right total knee replacement but no generalized arthritis. Childhood exposure to an uncle with tuberculosis. Her PPD skin test was negative. She denies history of lung disease, GERD, DVT, kidney or liver disease. Lab- C-ANCA POS 1:320, P-ANCA NEG. RA 1:23, ANA NEG11 year old female former smoking history referred courtesy of Dr. Elease Hashimoto with concern of abnormal chest CT. Husband is an allergy patient here.  05/13/11- 52 yoF former smoker  followed for bilateral pulmonary infiltrates, incr C-ANCA, s/p VATS bx/ organizing pneumonia, Metastatic intestinal carcinoid. Marland Kitchen Hx breast Ca 2006/ Dr Truddie Coco Husband here. She returns now after left lung biopsy. She has expected left thoracotomy pain but is otherwise stable as she continues maintenance prednisone. Denies blood, fever, swollen glands, discolored sputum. Exertional dyspnea is not worse, allowing for the surgery. I shared with her and her husband the available initial pathology: Patchy organizing pneumonia with chronic interstitial pneumonia and focal atypical glands. I reviewed the discussion about her at thoracic oncology conference. The parenchymal lung process is not a vasculitis as I originally questioned. I told her there was an incidental finding of cancer cells with further identification pending but that this was a separate process. We agreed to refer her to Dr. Truddie Coco who managed her breast cancer. After she had left, personal communication from Dr. Jimmy Footman- neuroendocrine tumor staining consistent with metastatic intestinal carcinoid.  06/20/11  11 yoF former smoker followed for bilateral pulmonary infiltrates, incr C-ANCA, s/p VATS bx/ organizing pneumonia, Metastatic intestinal carcinoid. Marland Kitchen Hx breast Ca 2006/ Dr Truddie Coco   Husband here She says her breathing is now "fine" back to her normal. She denies fever, cough, sputum. I spoke with Mrs. Roehrs myself and had to explain the discovery of carcinoid after her lung biopsy. We also have the note from the [facility] indicating they discussed it with her. She has absolutely no recollection. Little cough or shortness of breath now. Still a little posterior thoracotomy incision pain. CT 05/19/11- images reviewed with them- IMPRESSION:  1. Marked improvement in bilateral air space disease seen on  comparison CT of 03/24/2011. Near complete resolution.  2. Postoperative change in the left hemithorax consistent with  recent VATS.   Original Report  Authenticated By: Suzy Bouchard, M.D.   11/07/11- 57 yoF former smoker followed for bilateral pulmonary infiltrates, incr C-ANCA, s/p VATS bx/ organizing pneumonia, Metastatic intestinal carcinoid. Marland Kitchen Hx breast Ca 2006/ Dr Truddie Coco    c/o  Chronic vertigo, difficulty breathing and gets tired easy. Also some wheezing, post nasal,  and cough.  Some days she feels much better than others, no real pattern. Occasional light cough to clear throat. We had called in prednisone, which did help. Ambulatory around home, but gets out little. CT chest 09/09/11- I reviewed w/ her- there is significant patchy bilateral lower zone fibrotic scarring and some bronchial thickening. IMPRESSION:  1. No evidence of thoracic metastatic disease or other active  process.  2. Small mild cardiomegaly and hiatal hernia.  Original Report Authenticated By: Marlaine Hind, M.D.    02/09/12-  27 yoF former smoker followed for bilateral pulmonary infiltrates, incr C-ANCA, s/p VATS bx/ cryptogenic organizing pneumonia/ BOOP, Metastatic intestinal carcinoid. Marland Kitchen Hx breast Ca 2006/ Dr Truddie Coco  January had biopsy to ck for other lung condition. SOB and wheezing at times. Left sided pain around the  Back. Continues Advair 250. Due for followup chest CT in December for Dr. Jacquiline Doe. Not on prednisone. She says breathing is unchanged. Denies pain, blood or swollen nodes.   05/11/12-  89 yoF former smoker followed for bilateral pulmonary infiltrates, incr C-ANCA, s/p VATS bx/ cryptogenic organizing pneumonia/ BOOP, Metastatic intestinal carcinoid. Complicated by Hx breast Ca 2006/ Dr Truddie Coco. AFib/ Dr Martinique, DM. She says her husband is developing dementia. Deep breath makes her left flank feel tight where she had thoracentesis. Complains of itching since starting Eliquis.Atarax 50 mg was too strong and Benadryl did not help. Itching keeps her awake all night. No visible rash. She is convinced really unpleasant itching began when  she started Eliquis. I explained this medication needs to be transitioned carefully to anything else and Dr. Martinique would need to be the one to manage that. CT chest 03/26/12 . IMPRESSION:  1. No findings to strongly suggest metastatic disease to the  thorax.  2. There is a new focus of nodular ground-glass attenuation  measuring 1 cm in the anterior aspect of the left upper lobe which  is highly nonspecific. Initial follow-up by chest CT without  contrast is recommended in 3 months to confirm persistence. This  recommendation follows the consensus statement: Recommendations for  the Management of Subsolid Pulmonary Nodules Detected at CT: A  Statement from the [employer] Society as published in Radiology  2013; 266:304-317.  3. Atherosclerosis, including left main and three-vessel coronary  artery disease. Assessment for potential risk factor  modification, dietary therapy or pharmacologic therapy may be  warranted, if clinically indicated.  4. Slight increase in the small amount of pericardial fluid  compared to the prior examination. This is unlikely to be of  hemodynamic significance at this time.  5. Moderate cardiomegaly.  6. Additional incidental findings, similar to prior studies, as  discussed above.  Original Report Authenticated By: Vinnie Langton, M.D.   08/08/12- 15 yoF former smoker followed for bilateral pulmonary infiltrates/ nodules, incr C-ANCA, s/p VATS bx/ cryptogenic organizing pneumonia/ BOOP, Metastatic intestinal carcinoid. Complicated by Hx breast Ca 2006/ Dr Truddie Coco. AFib/ Dr Martinique, DM. Itching stopped, while continuing Eliquis. She had changed detergents and fabric softener, but cause unknown. Seasonal rhinitis symptoms now. Total IgE 05/11/2012 was less than 1.5. New burning itch, intermittent, right lateral ribs x2 weeks. Has had shingles shot. No visible rash. CT chest  08/02/12 IMPRESSION:  Interval persistence but decrease in size of the ground-glass   nodules seen in the anterior left upper lobe. Adenocarcinoma  cannot be excluded. Followup by CT is recommended in 12 months,  with continued annual surveillance for a minimum of 3 years.  These recommendations are taken from "Recommendations for the  Management of Subsolid Pulmonary Nodules Detected at CT: A  Statement from the [employer]" Radiology 2013; 266:1, 304-  317.  Original Report Authenticated By: Misty Stanley, M.D.   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 Relapse since 2013 11/02/12 PR-3 880. MPO negative. - GBM neg Likely - capillaritis. 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 Admitted with hemoptysis  hemoptysis with CT scan chest findings of alveolar hemorrhage  As of June 2014 the oncologist did not feel there was any evidence of breast cancer recurrence.CT scan of the chest ruled out pulmonary embolism but showed severe bilateral groundglass opacities consistent with alveolar hemorrhage pattern. Pulmonary consultation was called. She was initially treated by stopping eliquis, providing empiric antibiotics and supported with diuresis.  Pulmonary  felt The clinical picture was felt to be consistent with diffuse alveolar hemorrhage resulting in hemoptysis. Most likely cause of alveolar hemorrhage was felt to be autoimmune based on past history of autoimmune antibody positivity and lung biopsy showing organizing pneumonia in December 2012/January 2013. Prior C.-ANCA positive though no evidence of vasculitis in Jan 2013 lung bx which is puzzling. Alternative is relapsing BOOP but the hemoptysis would be unsual unless precipitated by Eliquis. On 11/02/12 PR-3 880. Nephrology consult  On  7/29 immunosuppression therapy was initiated. She was given Pulse methylprednisolone, for three days, and her first dose of Rituxan was given on 7/30. The following days we saw significant clinical improvement in regards to hemoptysis, pulmonary infiltrates and dyspnea, She was started on  Rituxan over cyclophosphamide due to malignancy history, and also this is technically a "relapse" or A "flare". Dose starting 11/03/12 will be Induction therapy with rituximab (375 mg/m2 per week for four weeks). W/  PCP prophylaxis w/ - Bactrim 1 DS on M, W, F  Since discharge she is feeling better with resolution of hemoptysis, . Dyspnea is less and she is starting to get some of her energy back.  No fever , orthopnea or edema.  Had labs done last week. , hbg stable at 8.9 , renal fx tr down at 1.6 (max scr 2.4) . Ov with oncology last week. Rituxan  infusion given.     ROS-see HPI Constitutional:   No-   weight loss, night sweats, fevers, chills, fatigue, lassitude. HEENT:   No-  headaches, difficulty swallowing, tooth/dental problems, sore throat,       No-  sneezing, +itching, ear ache, nasal congestion, post nasal drip,  CV: +chest pain, orthopnea, PND, swelling in lower extremities, anasarca,   + chronic dizziness,   No-palpitations Resp: +   shortness of breath with exertion or at rest.              Minimal  productive cough,  No non-productive cough,               No-   change in color of mucus.  No- wheezing.   Skin: No-  rash or lesions.+ intense pruritus GI:  No-   heartburn, indigestion, abdominal pain, nausea, vomiting,  GU: MS:  No-   joint pain or swelling.  Neuro-     +Vertigo- uses wheelchair Psych:  No- change in mood or affect. No depression or anxiety.  No memory loss.  OBJ General- Alert, Oriented, Affect-appropriate, Distress- none acute, overweight, wheelchair Skin- rash-none, lesions- none, excoriation- none Lymphadenopathy- none Head- atraumatic            Eyes- Gross vision  intact, PERRLA, conjunctivae -pale            Ears- Hearing, canals-normal            Nose- turbinate edema, no-Septal dev, mucus, polyps, erosion, perforation             Throat- Mallampati II-III , mucosa clear , drainage- none, tonsils- atrophic Neck- flexible , trachea midline, no stridor , thyroid nl, carotid no bruit Chest - symmetrical excursion , unlabored           Heart/CV-+ irregularly irregular , 2-3/6 precordial systolic murmur , no gallop  , no rub, nl s1 s2                           - JVD- none , edema- none, stasis changes- none, varices- none           Lung- clear, no wheeze , cough- none , dullness-none, rub- none           Chest wall- healed VATS thoracotomy incision L Abd-  Br/ Gen/ Rectal- Not done, not indicated Extrem- cyanosis- none, clubbing, none, atrophy- none, strength- nl. Wheelchair for distance/ cane at home. Limited mobility R shoulder -extension limited by pain. Neuro- grossly intact to observation

## 2012-11-21 ENCOUNTER — Other Ambulatory Visit: Payer: Self-pay | Admitting: Oncology

## 2012-11-24 ENCOUNTER — Encounter: Payer: Self-pay | Admitting: Family

## 2012-11-24 ENCOUNTER — Ambulatory Visit (HOSPITAL_BASED_OUTPATIENT_CLINIC_OR_DEPARTMENT_OTHER): Payer: Medicare Other | Admitting: Family

## 2012-11-24 ENCOUNTER — Other Ambulatory Visit: Payer: Medicare Other | Admitting: Lab

## 2012-11-24 ENCOUNTER — Other Ambulatory Visit (HOSPITAL_BASED_OUTPATIENT_CLINIC_OR_DEPARTMENT_OTHER): Payer: Medicare Other | Admitting: Lab

## 2012-11-24 ENCOUNTER — Ambulatory Visit (HOSPITAL_BASED_OUTPATIENT_CLINIC_OR_DEPARTMENT_OTHER): Payer: Medicare Other

## 2012-11-24 VITALS — BP 159/69 | HR 73 | Temp 98.1°F

## 2012-11-24 VITALS — BP 157/64 | HR 96 | Temp 98.6°F | Resp 18 | Ht 65.0 in | Wt 198.7 lb

## 2012-11-24 DIAGNOSIS — D72829 Elevated white blood cell count, unspecified: Secondary | ICD-10-CM | POA: Diagnosis not present

## 2012-11-24 DIAGNOSIS — Z5112 Encounter for antineoplastic immunotherapy: Secondary | ICD-10-CM

## 2012-11-24 DIAGNOSIS — C50919 Malignant neoplasm of unspecified site of unspecified female breast: Secondary | ICD-10-CM

## 2012-11-24 DIAGNOSIS — T380X5A Adverse effect of glucocorticoids and synthetic analogues, initial encounter: Secondary | ICD-10-CM

## 2012-11-24 DIAGNOSIS — M3130 Wegener's granulomatosis without renal involvement: Secondary | ICD-10-CM

## 2012-11-24 DIAGNOSIS — E119 Type 2 diabetes mellitus without complications: Secondary | ICD-10-CM

## 2012-11-24 DIAGNOSIS — Z853 Personal history of malignant neoplasm of breast: Secondary | ICD-10-CM | POA: Diagnosis not present

## 2012-11-24 LAB — COMPREHENSIVE METABOLIC PANEL (CC13)
ALT: 16 U/L (ref 0–55)
Albumin: 3.2 g/dL — ABNORMAL LOW (ref 3.5–5.0)
CO2: 19 mEq/L — ABNORMAL LOW (ref 22–29)
Calcium: 8.5 mg/dL (ref 8.4–10.4)
Chloride: 98 mEq/L (ref 98–109)
Glucose: 315 mg/dl — ABNORMAL HIGH (ref 70–140)
Potassium: 5.2 mEq/L — ABNORMAL HIGH (ref 3.5–5.1)
Sodium: 127 mEq/L — ABNORMAL LOW (ref 136–145)
Total Bilirubin: 1.15 mg/dL (ref 0.20–1.20)
Total Protein: 6.5 g/dL (ref 6.4–8.3)

## 2012-11-24 LAB — CBC WITH DIFFERENTIAL/PLATELET
Eosinophils Absolute: 0.1 10*3/uL (ref 0.0–0.5)
HCT: 25.8 % — ABNORMAL LOW (ref 34.8–46.6)
LYMPH%: 8.8 % — ABNORMAL LOW (ref 14.0–49.7)
MCHC: 33.3 g/dL (ref 31.5–36.0)
MONO#: 0.5 10*3/uL (ref 0.1–0.9)
NEUT#: 9.2 10*3/uL — ABNORMAL HIGH (ref 1.5–6.5)
NEUT%: 85.5 % — ABNORMAL HIGH (ref 38.4–76.8)
Platelets: 180 10*3/uL (ref 145–400)
WBC: 10.7 10*3/uL — ABNORMAL HIGH (ref 3.9–10.3)

## 2012-11-24 MED ORDER — SODIUM CHLORIDE 0.9 % IV SOLN
375.0000 mg/m2 | Freq: Once | INTRAVENOUS | Status: AC
  Administered 2012-11-24: 800 mg via INTRAVENOUS
  Filled 2012-11-24: qty 80

## 2012-11-24 MED ORDER — DIPHENHYDRAMINE HCL 25 MG PO CAPS
25.0000 mg | ORAL_CAPSULE | Freq: Once | ORAL | Status: AC
  Administered 2012-11-24: 25 mg via ORAL

## 2012-11-24 MED ORDER — SODIUM CHLORIDE 0.9 % IV SOLN
Freq: Once | INTRAVENOUS | Status: AC
  Administered 2012-11-24: 12:00:00 via INTRAVENOUS

## 2012-11-24 MED ORDER — ACETAMINOPHEN 325 MG PO TABS
650.0000 mg | ORAL_TABLET | Freq: Once | ORAL | Status: AC
  Administered 2012-11-24: 650 mg via ORAL

## 2012-11-24 MED ORDER — DEXAMETHASONE SODIUM PHOSPHATE 10 MG/ML IJ SOLN
10.0000 mg | Freq: Once | INTRAMUSCULAR | Status: AC
  Administered 2012-11-24: 10 mg via INTRAVENOUS

## 2012-11-24 NOTE — Progress Notes (Signed)
[facility]  [phone] [phone]  OFFICE PROGRESS NOTE   ID: [name] [name]   DOB: [birth year]  MR#: [id number]  KVQ#:[id number]   PCP: [name], MD GYN: Juanda Chance, WHNP-C SU: Alphonsa Overall, MD RAD ONC: Rexene Edison, MD CARDIO: Peter M.  Martinique, MD PULM: Tarri Fuller D. Annamaria Boots, MD NEPHRO:  Roney Jaffe, MD   HISTORY OF PRESENT ILLNESS: From Dr. Collier Salina Rubin's new patient evaluation note dated 10/01/2005: "This is a 79 year old woman referred by Dr. Lucia Gaskins for evaluation and treatment of breast cancer.  This woman undergoes annual screening mammography.  She underwent initial mammogram on 08/06/2005.  This showed a suspicious mass in the left breast.  A diagnostic mammogram performed on 08/19/2005 showed a 5 x 4 x 4 mm spiculated mass with microcalfication of the left anterior upper outer quadrant at 1 o'clock.  No palpable mass was identified.  Ultrasound showed hypoechoic mass with some shadowing and microcalcifications.  Biopsy was performed on 08/19/2005 and this showed an invasive mammary carcinoma intermediate grade, ER/PR positive at 94% and 86% respectively, proliferative index 9%, HER-2/neu was 2+ with no overexpression by FISH.  No polysomy was seen with adjuvant in her chromosome 17 of 3.  MRI scan of both breasts was performed showing a solitary abnormality in the left breast and right breast was normal.  The patient elected to undergo lumpectomy and sentinel lymph node evaluation on 09/09/2005.  The final pathology showed a 0.9 cm grade 3 of 3 invasive ductal cancer.  Lymphovascular invasion was not seen though lymphovascular invasion was identified in the original biopsy.  The solitary lymph node, which was identified, was negative for malignancy.  She has had an unremarkable postoperative course."  Her subsequent history is as detailed below.   INTERVAL HISTORY: [name] [name] was seen today for follow up of granulomatous polyangitis  (Wegener's disease) currently being treated with Rituxan infusions.   It will be recalled that [name] [name] has a history of invasive ductal carcinoma with ductal carcinoma in situ of the left breast and possible neuroendocrine type lung carcinoma.  The patient is accompanied to today's office visit by her husband Samona Chihuahua.  The patient was last seen by [place] on 11/10/2012.     REVIEW OF SYSTEMS: A 10 point review of systems was completed and is negative. The patient is sitting in a wheelchair for her office visit today and states she feels great.  She tolerated her second Rituxan infusion without difficulty.  [name] [name] denies any epistaxis or unusual bleeding/bruising, she states that she has an appetite and is eating a balanced diet,and  her bowel movements/urinary habits are regular.  The patient  has an ongoing history of vertigo which is controlled when she takes Valium and is the reason she uses a wheelchair.  [name] [name] denies any symptomatology and her review of systems is otherwise noncontributory.   PAST MEDICAL HISTORY: Past Medical History  Diagnosis Date  . HYPOTHYROIDISM 07/24/2008  . HYPERLIPIDEMIA 07/24/2008  . DEPRESSION 05/16/2009  . HYPERTENSION 07/24/2008  . Vertigo   . Diabetes mellitus type II   . Arthritis     r tkr  . CVA (cerebral vascular accident) 2008    mini stroke.no residual  . Heart murmur     stress test 2009.dr peter Martinique  . Intermittent vertigo   . Skin abnormality     facial lesions .pt applying mupiracin to areas  . Atrial fibrillation   . Breast cancer   .  Pneumonia   . Wegener's disease, pulmonary 10/2012    PAST SURGICAL HISTORY: Past Surgical History  Procedure Laterality Date  . Knee surgery  2009    TKR  . Cholecystectomy  1980  . Joint replacement      r knee  . Breast surgery  2007    Lumpectomy, XRT 2006.l breast  . Video assisted thoracoscopy  04/22/2011    Procedure: VIDEO ASSISTED THORACOSCOPY;  Surgeon: Melrose Nakayama, MD;   Location: [facility];  Service: Thoracic;  Laterality: Left;  WITH BIOPSY  Include cholecystectomy with exploratory laparotomy, and right heel surgery.   FAMILY HISTORY Family History  Problem Relation Age of Onset  . Arthritis Mother   . Rheum arthritis Mother   . Heart disease Father   . Coronary artery disease Father   . Cancer Sister     breast CA, both sisters  . Breast cancer Sister   . Coronary artery disease Brother   . Lung cancer Brother   Both parents both deceased.  She has had one brother who died of lung cancer.  She has had two sisters with breast cancer, one dying at age 36.  One sister was diagnosed at age 82 and it recurred 57 years later.  She had one brother who died of pancreatic cancer as well.   GYNECOLOGIC HISTORY: Gravida 3, para 3.    Menarche age 53 and menopause in her 45s.  She was on hormone replacement therapy with Premarin, Provera for 15-20 years.  SOCIAL HISTORY: Mr. and [name] [name] have been married since 42.  They have 3 adult sons, 7 grandchildren, and 6 great-grandchildren.   She has retired from [employer] as a Administrator.  Her husband is also retired from [employer] in 1996. In her spare time the patient enjoys cooking/baking and reading.    ADVANCED DIRECTIVES: Not on file   HEALTH MAINTENANCE: History  Substance Use Topics  . Smoking status: Former Smoker -- 0.50 packs/day for 12 years    Types: Cigarettes    Quit date: 06/04/1982  . Smokeless tobacco: Never Used  . Alcohol Use: No    Colonoscopy: 05/05/2003 PAP: Not on file Bone density: The patient's last bone density scan on 10/16/2011 showed a T score of -1.8 (osteopenia). Lipid panel: Not on file   Allergies  Allergen Reactions  . Codeine Sulfate     REACTION: GI upset  . Sulfonamide Derivatives     REACTION: GI upset  . Atarax [Hydroxyzine] Nausea Only and Rash    Current Outpatient Prescriptions  Medication Sig Dispense Refill  .  apixaban (ELIQUIS) 5 MG TABS tablet Take 1 tablet (5 mg total) by mouth 2 (two) times daily.  60 tablet  6  . AZOR 5-40 MG per tablet TAKE ONE TABLET BY MOUTH EVERY DAY  90 tablet  3  . BYSTOLIC 10 MG tablet TAKE ONE TABLET BY MOUTH EVERY DAY  90 tablet  3  . CALCIUM CITRATE PO Take 1,200 mg by mouth daily.        . cholecalciferol (VITAMIN D) 1000 UNITS tablet Take 3,000 Units by mouth daily.        . diazepam (VALIUM) 5 MG tablet TAKE ONE TABLET BY MOUTH EVERY DAY AS NEEDED.  30 tablet  0  . fluticasone (FLONASE) 50 MCG/ACT nasal spray Place 2 sprays into the nose daily as needed.      . gabapentin (NEURONTIN) 100 MG capsule Take 1 capsule (100 mg  total) by mouth 2 (two) times daily before a meal.  60 capsule  6  . glipiZIDE (GLUCOTROL) 10 MG tablet TAKE ONE TABLET BY MOUTH TWICE DAILY  180 tablet  3  . levothyroxine (SYNTHROID, LEVOTHROID) 125 MCG tablet TAKE ONE TABLET BY MOUTH EVERY DAY  90 tablet  3  . loratadine (CLARITIN) 10 MG tablet Take 10 mg by mouth daily.      . metFORMIN (GLUCOPHAGE-XR) 500 MG 24 hr tablet TAKE TWO TABLETS BY MOUTH TWICE DAILY  120 tablet  11  . mometasone-formoterol (DULERA) 100-5 MCG/ACT AERO Inhale 2 puffs into the lungs 2 (two) times daily.  1 Inhaler  6  . multivitamin (METANX) 3-35-2 MG TABS tablet TAKE ONE TABLET BY MOUTH EVERY DAY  90 tablet  3  . ondansetron (ZOFRAN) 4 MG tablet Take 4 mg by mouth every 8 (eight) hours as needed for nausea.      . predniSONE (DELTASONE) 20 MG tablet Take 3 tablets (60 mg total) by mouth daily with breakfast.  90 tablet  3  . Pyridoxine HCl (VITAMIN B-6 PO) Take 100 mg by mouth daily.       . rosuvastatin (CRESTOR) 5 MG tablet Take 1 tablet (5 mg total) by mouth daily.  30 tablet  11  . sulfamethoxazole-trimethoprim (BACTRIM DS) 800-160 MG per tablet 1 tab three times a week. (Monday, Wednesday and Friday)  15 tablet  6  . triamcinolone cream (KENALOG) 0.1 % Apply 1 application topically 2 (two) times daily as needed.  Compound 1:1 with Eucerin cream       No current facility-administered medications for this visit.    OBJECTIVE: Filed Vitals:   11/24/12 0917  BP: 157/64  Pulse: 96  Temp: 98.6 F (37 C)  Resp: 18     Body mass index is 33.07 kg/(m^2).     Oxygen saturation 96% on room air  ECOG FS: 2 - Symptomatic, <50% confined to bed  General appearance: Alert, cooperative, well nourished, no apparent distress, in a wheelchair (due to vertigo) Head: Normocephalic, without obvious abnormality, atraumatic Eyes: Arcus senilis, PERRLA, EOMI Nose: Nares, septum and mucosa are normal, no drainage or sinus tenderness Neck: No adenopathy, supple, symmetrical, trachea midline, thyroid not enlarged, no tenderness Resp: Clear to auscultation bilaterally , diminished right lower lobe breath sounds Cardio: Irregularly irregular, no murmur, no click, rub or gallop Breasts: Deferred GI: Soft, distended, non-tender, hypoactive bowel sounds, no organomegaly from sitting position Skin: Bilateral upper extremity ecchymoses in various stages of healing Extremities: Extremities normal, atraumatic, no cyanosis or edema Lymph nodes: Cervical, supraclavicular, and axillary nodes normal Neurologic: Grossly normal    LAB RESULTS: Lab Results  Component Value Date   WBC 10.7* 11/24/2012   NEUTROABS 9.2* 11/24/2012   HGB 8.6* 11/24/2012   HCT 25.8* 11/24/2012   MCV 90.5 11/24/2012   PLT 180 11/24/2012      Chemistry      Component Value Date/Time   NA 127* 11/24/2012 0901   NA 133* 11/07/2012 0610   NA 136 09/09/2011 0939   K 5.2* 11/24/2012 0901   K 3.7 11/07/2012 0610   K 4.7 09/09/2011 0939   CL 96 11/07/2012 0610   CL 98 08/02/2012 0824   CL 95* 09/09/2011 0939   CO2 19* 11/24/2012 0901   CO2 28 11/07/2012 0610   CO2 29 09/09/2011 0939   BUN 29.4* 11/24/2012 0901   BUN 61* 11/07/2012 0610   BUN 17 09/09/2011 0939   CREATININE 1.7*  11/24/2012 0901   CREATININE 1.81* 11/07/2012 0610   CREATININE 1.0 09/09/2011 0939       Component Value Date/Time   CALCIUM 8.5 11/24/2012 0901   CALCIUM 8.8 11/07/2012 0610   CALCIUM 9.0 09/09/2011 0939   ALKPHOS 44 11/24/2012 0901   ALKPHOS 93 11/03/2012 0310   ALKPHOS 45 09/09/2011 0939   AST 14 11/24/2012 0901   AST 21 11/03/2012 0310   AST 26 09/09/2011 0939   ALT 16 11/24/2012 0901   ALT 12 11/03/2012 0310   ALT 19 09/09/2011 0939   BILITOT 1.15 11/24/2012 0901   BILITOT 1.1 11/03/2012 0310   BILITOT 0.60 09/09/2011 0939      Lab Results  Component Value Date   LABCA2 28 09/09/2011    Urinalysis    Component Value Date/Time   COLORURINE AMBER* 11/02/2012 2113   APPEARANCEUR CLOUDY* 11/02/2012 2113   LABSPEC 1.026 11/02/2012 2113   PHURINE 5.5 11/02/2012 2113   GLUCOSEU NEGATIVE 11/02/2012 2113   HGBUR LARGE* 11/02/2012 2113   BILIRUBINUR SMALL* 11/02/2012 2113   BILIRUBINUR n 12/30/2010 1700   KETONESUR NEGATIVE 11/02/2012 2113   PROTEINUR 100* 11/02/2012 2113   UROBILINOGEN 1.0 11/02/2012 2113   UROBILINOGEN 0.2 12/30/2010 1700   NITRITE NEGATIVE 11/02/2012 2113   NITRITE n 12/30/2010 1700   LEUKOCYTESUR NEGATIVE 11/02/2012 2113    STUDIES: 1.  The patient's last bilateral digital diagnostic mammogram on 06/17/2012 showed a magnification view of the lumpectomy site and routine views of both breasts again demonstrate scarring within the upper outer left breast. There is no evidence of suspicious mass, nonsurgical distortion or worrisome calcifications bilaterally.  Benign findings.  2.  Dg Chest 2 View 11/19/2012   *RADIOLOGY REPORT*  Clinical Data: Follow up Wegener's granulomatosis  CHEST - 2 VIEW  Comparison: 11/08/2012  Findings: The cardiac shadow remains enlarged.  Coarsened interstitial changes are again noted throughout both lungs.  No focal infiltrate or sizable effusion is seen.  IMPRESSION: No change from prior exam.  No acute abnormality is noted.   Original Report Authenticated By: Inez Catalina, M.D.     ASSESSMENT: [name] [name] is a 79 y.o. [place], [place]  woman:  1.  Status post left breast lumpectomy with left axillary sentinel node biopsy on 09/09/2005 for a, stage I, pT1b pN0 (i-) (sn), 0.9 cm invasive ductal carcinoma grade 3, with high-grade ductal carcinoma in situ, estrogen receptor 94% positive, progesterone receptor 86% positive, Ki-67 9%, HER-2/neu 2+ by FISH H. 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  2.  Oncotype DX report dated 10/13/2005 showed a recurrent score of 19 with average rate of distant recurrence at 12%.  3.  Status post radiation therapy from 11/11/2005 through 12/26/2005.  4.  The patient started antiestrogen therapy with Arimidex in 01/2006.  The patient also started Fosamax at that time.  She completed antiestrogen therapy in 01/2011.  5.  The patient was being treated by her primary care physician for pneumonia in 04/2011. She was subsequently seen by a pulmonologist who in turn was concerned about Wegener's disease. He referred her for a lung biopsy. VATS lung biopsy was performed by Dr. Roxan Hockey on 04/22/2011. The patient had a PET scan on 05/21/2011 which showed no clear evidence of malignancy.  Uptake along the left lateral chest wall was likely related to surgery.  Interval improvement of airspace disease within the lungs.  No evidence of malignancy within the abdomen or pelvis.    6.  Dr. Truddie Coco noted that the patient did have evidence of atypical glandular cells in her pathology from the lung biopsy.  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  Her last CT of the chest with contrast on 08/02/2012 showed an interval persistence but decrease in size of the ground-glass nodules seen in the anterior left upper lobe.  Adenocarcinoma cannot be excluded.  Followup by CT is recommended in 12 months, with continued annual surveillance for a minimum of 3 years.  7.  Hospitalization from 11/01/2012 through 11/07/2012 for pneumonia and  granulomatous polyangiitis (Wegener's disease).  8.  Steroid-induced hyperglycemia and leukocytosis.   PLAN: She will proceed with Rituxan infusion #3 of 4 planned infusions today.  She received her first infusion of Rituxan on 11/03/2012 while hospitalized and her second Rituxan infusion on 11/17/2012.  She is tolerating the infusions well.  Her next and last Rituxan infusion is scheduled for 12/01/2012. Laboratories of CBC and CMP will be checked prior to each immunotherapy infusion.  She is scheduled to followup with Pulmonologist Dr. Annamaria Boots on 12/03/2012.  We plan to see her again for an office visit one week after her last immunotherapy infusion on 12/08/2012.   [name] [name] is currently on a slow prednisone taper with the goal of reaching 20 mg per day by the end of 2 months per Dr. Elsworth Soho, Pulmonologist.  Subsequently, she has steroid-induced hyperglycemia and leukocytosis.  The patient has been instructed how to adjust her diabetes medications for prednisone taper.  With regards to her breast cancer, we still plan to see the patient again in one year (09/2013).  We will check laboratories of CBC, CMP, LDH, and vitamin D level at that time.  [name] [name] is scheduled for her annual bilateral digital diagnostic mammogram in 06/2013. She is also scheduled to have a follow up CT of the chest with contrast in 09/2013.  Her last CT of the chest with contrast on 08/02/2012 recommended annual surveillance of the nodules seen in the anterior left upper lobe for a minimum of 3 years.  .  All questions were answered.  The patient and her husband were encouraged to contact us in the interim with any problems, questions or concerns.   Ailene Ards, NP-C 11/24/2012, 7:16 PM

## 2012-11-24 NOTE — Patient Instructions (Addendum)
Please contact us at [phone] if you have any questions or concerns.  Please continue to do well and enjoy life!!!  Get plenty of rest, drink plenty of water, exercise daily (walking as tolerated), eat a balanced diet.  Results for orders placed in visit on 11/24/12 (from the past 24 hour(s))  CBC WITH DIFFERENTIAL     Status: Abnormal   Collection Time    11/24/12  8:58 AM      Result Value Range   WBC 10.7 (*) 3.9 - 10.3 10e3/uL   NEUT# 9.2 (*) 1.5 - 6.5 10e3/uL   HGB 8.6 (*) 11.6 - 15.9 g/dL   HCT 25.8 (*) 34.8 - 46.6 %   Platelets 180  145 - 400 10e3/uL   MCV 90.5  79.5 - 101.0 fL   MCH 30.2  25.1 - 34.0 pg   MCHC 33.3  31.5 - 36.0 g/dL   RBC 2.85 (*) 3.70 - 5.45 10e6/uL   RDW 14.8 (*) 11.2 - 14.5 %   lymph# 0.9  0.9 - 3.3 10e3/uL   MONO# 0.5  0.1 - 0.9 10e3/uL   Eosinophils Absolute 0.1  0.0 - 0.5 10e3/uL   Basophils Absolute 0.0  0.0 - 0.1 10e3/uL   NEUT% 85.5 (*) 38.4 - 76.8 %   LYMPH% 8.8 (*) 14.0 - 49.7 %   MONO% 4.8  0.0 - 14.0 %   EOS% 0.8  0.0 - 7.0 %   BASO% 0.1  0.0 - 2.0 %   nRBC 0  0 - 0 %   Narrative:    Performed At:  [facility]               [address]               [place], [place] [address]    Dg Chest 2 View 11/19/2012   *RADIOLOGY REPORT*  Clinical Data: Follow up Wegener's granulomatosis  CHEST - 2 VIEW  Comparison: 11/08/2012  Findings: The cardiac shadow remains enlarged.  Coarsened interstitial changes are again noted throughout both lungs.  No focal infiltrate or sizable effusion is seen.  IMPRESSION: No change from prior exam.  No acute abnormality is noted.   Original Report Authenticated By: Inez Catalina, M.D.

## 2012-11-24 NOTE — Patient Instructions (Addendum)

## 2012-11-26 ENCOUNTER — Telehealth: Payer: Self-pay | Admitting: Dietician

## 2012-11-30 ENCOUNTER — Ambulatory Visit (INDEPENDENT_AMBULATORY_CARE_PROVIDER_SITE_OTHER): Payer: Medicare Other | Admitting: Physician Assistant

## 2012-11-30 ENCOUNTER — Other Ambulatory Visit: Payer: Self-pay | Admitting: *Deleted

## 2012-11-30 ENCOUNTER — Encounter: Payer: Self-pay | Admitting: Physician Assistant

## 2012-11-30 VITALS — BP 162/87 | HR 72 | Ht 65.5 in | Wt 203.0 lb

## 2012-11-30 DIAGNOSIS — M3130 Wegener's granulomatosis without renal involvement: Secondary | ICD-10-CM | POA: Diagnosis not present

## 2012-11-30 DIAGNOSIS — I4891 Unspecified atrial fibrillation: Secondary | ICD-10-CM | POA: Diagnosis not present

## 2012-11-30 DIAGNOSIS — E785 Hyperlipidemia, unspecified: Secondary | ICD-10-CM | POA: Diagnosis not present

## 2012-11-30 DIAGNOSIS — I10 Essential (primary) hypertension: Secondary | ICD-10-CM

## 2012-11-30 NOTE — Patient Instructions (Addendum)
NO CHANGES WERE MADE TODAY  PLEASE FOLLOW UP WITH DR. Martinique 01/21/13 @ 10:15

## 2012-11-30 NOTE — Progress Notes (Signed)
[facility]. [address], [address], [place]  [address] Phone: [phone] Fax:  [phone]  Date:  11/30/2012   ID:  Janney, Priego 02-08-1933, MRN [id number]  PCP:  [name], MD  Cardiologist:  Dr. Peter Martinique     History of Present Illness: [name] [name] is a 79 y.o. female with a hx of HTN, DM2, prior TIA, HL, breast CA, atrial fibrillation.  She was placed on Eliquis (CHADS2-VASc=6) earlier this year.  She was recently admitted to [facility] for hemoptysis and worsening renal function and ultimately dx with granulomatosis with polyangiitis (Wegener's).  Chest CT demonstrated no pulmonary embolism but did demonstrate alveolar hemorrhage pattern.  She was initially diuresed with Lasix but had worsening creatinine.  Infiltrates were felt to less likely be related to edema and Lasix was stopped.  Echo 11/02/12:  EF 55-60%, trivial AI, mod MR, mod to severe LAE, mild RVE, mod RAE, severe TR, severely increased PASP, oscillating density in RA (chiari network).  She was seen by cardiology.  Eliquis was held from some time as her hemoptysis cleared.  She was placed on prednisone and Rituxan.  She was started back on Eliquis at d/c.  Since d/c, she is doing well.  Denies CP.  She notes her dyspnea is improved.  She is able to do ADLs.  No syncope.  No orthopnea, PND, edema.    Labs (8/14):  K 5.2, Cr 1.7 (peak 2.42), ALT 16, LDL 77, Hgb 8.6   Wt Readings from Last 3 Encounters:  11/30/12 203 lb (92.08 kg)  11/24/12 198 lb 11.2 oz (90.13 kg)  11/19/12 200 lb 3.2 oz (90.81 kg)     Past Medical History  Diagnosis Date  . HYPOTHYROIDISM 07/24/2008  . HYPERLIPIDEMIA 07/24/2008  . DEPRESSION 05/16/2009  . HYPERTENSION 07/24/2008  . Vertigo   . Diabetes mellitus type II   . Arthritis     r tkr  . CVA (cerebral vascular accident) 2008    mini stroke.no residual  . Heart murmur     stress test 2009.dr peter Martinique  . Intermittent vertigo   . Skin abnormality     facial  lesions .pt applying mupiracin to areas  . Atrial fibrillation   . Breast cancer   . Pneumonia   . Wegener's disease, pulmonary 10/2012    Current Outpatient Prescriptions  Medication Sig Dispense Refill  . apixaban (ELIQUIS) 5 MG TABS tablet Take 1 tablet (5 mg total) by mouth 2 (two) times daily.  60 tablet  6  . AZOR 5-40 MG per tablet TAKE ONE TABLET BY MOUTH EVERY DAY  90 tablet  3  . BYSTOLIC 10 MG tablet TAKE ONE TABLET BY MOUTH EVERY DAY  90 tablet  3  . CALCIUM CITRATE PO Take 1,200 mg by mouth daily.        . cholecalciferol (VITAMIN D) 1000 UNITS tablet Take 3,000 Units by mouth daily.        . diazepam (VALIUM) 5 MG tablet TAKE ONE TABLET BY MOUTH EVERY DAY AS NEEDED.  30 tablet  0  . fluticasone (FLONASE) 50 MCG/ACT nasal spray Place 2 sprays into the nose daily as needed.      . gabapentin (NEURONTIN) 100 MG capsule Take 1 capsule (100 mg total) by mouth 2 (two) times daily before a meal.  60 capsule  6  . glipiZIDE (GLUCOTROL) 10 MG tablet TAKE ONE TABLET BY MOUTH TWICE DAILY  180 tablet  3  .  levothyroxine (SYNTHROID, LEVOTHROID) 125 MCG tablet TAKE ONE TABLET BY MOUTH EVERY DAY  90 tablet  3  . loratadine (CLARITIN) 10 MG tablet Take 10 mg by mouth daily.      . metFORMIN (GLUCOPHAGE-XR) 500 MG 24 hr tablet TAKE TWO TABLETS BY MOUTH TWICE DAILY  120 tablet  11  . mometasone-formoterol (DULERA) 100-5 MCG/ACT AERO Inhale 2 puffs into the lungs 2 (two) times daily.  1 Inhaler  6  . multivitamin (METANX) 3-35-2 MG TABS tablet TAKE ONE TABLET BY MOUTH EVERY DAY  90 tablet  3  . ondansetron (ZOFRAN) 4 MG tablet Take 4 mg by mouth every 8 (eight) hours as needed for nausea.      . predniSONE (DELTASONE) 20 MG tablet Take 3 tablets (60 mg total) by mouth daily with breakfast.  90 tablet  3  . Pyridoxine HCl (VITAMIN B-6 PO) Take 100 mg by mouth daily.       . rosuvastatin (CRESTOR) 5 MG tablet Take 1 tablet (5 mg total) by mouth daily.  30 tablet  11  .  sulfamethoxazole-trimethoprim (BACTRIM DS) 800-160 MG per tablet 1 tab three times a week. (Monday, Wednesday and Friday)  15 tablet  6  . triamcinolone cream (KENALOG) 0.1 % Apply 1 application topically 2 (two) times daily as needed. Compound 1:1 with Eucerin cream       No current facility-administered medications for this visit.    Allergies:    Allergies  Allergen Reactions  . Codeine Sulfate     REACTION: GI upset  . Sulfonamide Derivatives     REACTION: GI upset  . Atarax [Hydroxyzine] Nausea Only and Rash    Social History:  The patient  reports that she quit smoking about 30 years ago. Her smoking use included Cigarettes. She has a 6 pack-year smoking history. She has never used smokeless tobacco. She reports that she does not drink alcohol or use illicit drugs.   ROS:  Please see the history of present illness.   No hemoptysis.   All other systems reviewed and negative.   PHYSICAL EXAM: VS:  BP 162/87  Pulse 72  Ht 5' 5.5" (1.664 m)  Wt 203 lb (92.08 kg)  BMI 33.26 kg/m2 Well nourished, well developed, in no acute distress HEENT: normal Neck: no JVD at 90 Cardiac:  normal S1, S2; irregularly irregular rhythm; no murmur Lungs:  Decreased breath sounds bilaterally, no wheezing, rhonchi or rales Abd: soft, nontender Ext: trace bilateral ankle edema Skin: warm and dry Neuro:  CNs 2-12 intact, no focal abnormalities noted  EKG:  Atrial fibrillation, HR 72     ASSESSMENT AND PLAN:  1. Atrial Fibrillation:  Rate controlled.  No further hemoptysis.  She is on the correct dose of Eliquis for her (age > 64 and weight > 60 kg).  She may need to switch to coumadin if hemoptysis recurs.  CHADS2-VASc=6.  She would be at significant risk of stroke if she has to stop anticoagulation. 2. Pulmonary HTN:  Likely related to sequelae of Wegener's. 3. Hypertension:  BP typically better controlled.  Continue to monitor. 4. Hyperlipidemia:  Continue statin. 5. Granulomatosis with  Polyangiitis (Wegener's):  Continue follow up with pulmonary and oncology as directed. 6. Disposition:  Follow up with Dr. Peter Martinique 6-8 weeks.   Signed, Richardson Dopp, PA-C  11/30/2012 5:04 PM

## 2012-12-01 ENCOUNTER — Encounter (HOSPITAL_COMMUNITY)
Admission: RE | Admit: 2012-12-01 | Discharge: 2012-12-01 | Disposition: A | Discharge status: Home / Self Care | Payer: Medicare Other | Source: Ambulatory Visit | Attending: Oncology | Admitting: Oncology

## 2012-12-01 ENCOUNTER — Other Ambulatory Visit: Payer: Self-pay | Admitting: Oncology

## 2012-12-01 ENCOUNTER — Other Ambulatory Visit: Payer: Self-pay | Admitting: Family

## 2012-12-01 ENCOUNTER — Other Ambulatory Visit (HOSPITAL_BASED_OUTPATIENT_CLINIC_OR_DEPARTMENT_OTHER): Payer: Medicare Other | Admitting: Lab

## 2012-12-01 ENCOUNTER — Ambulatory Visit (HOSPITAL_BASED_OUTPATIENT_CLINIC_OR_DEPARTMENT_OTHER): Payer: Medicare Other

## 2012-12-01 VITALS — BP 168/73 | HR 63 | Temp 97.8°F | Resp 20

## 2012-12-01 DIAGNOSIS — C50919 Malignant neoplasm of unspecified site of unspecified female breast: Secondary | ICD-10-CM | POA: Diagnosis not present

## 2012-12-01 DIAGNOSIS — M3130 Wegener's granulomatosis without renal involvement: Secondary | ICD-10-CM | POA: Insufficient documentation

## 2012-12-01 LAB — CBC WITH DIFFERENTIAL/PLATELET
BASO%: 0 % (ref 0.0–2.0)
Eosinophils Absolute: 0.2 10*3/uL (ref 0.0–0.5)
LYMPH%: 10.4 % — ABNORMAL LOW (ref 14.0–49.7)
MCHC: 33 g/dL (ref 31.5–36.0)
MONO#: 0.7 10*3/uL (ref 0.1–0.9)
NEUT#: 6.3 10*3/uL (ref 1.5–6.5)
Platelets: 186 10*3/uL (ref 145–400)
RBC: 2.56 10*6/uL — ABNORMAL LOW (ref 3.70–5.45)
RDW: 15.4 % — ABNORMAL HIGH (ref 11.2–14.5)
WBC: 8 10*3/uL (ref 3.9–10.3)

## 2012-12-01 LAB — COMPREHENSIVE METABOLIC PANEL (CC13)
BUN: 28.4 mg/dL — ABNORMAL HIGH (ref 7.0–26.0)
CO2: 22 mEq/L (ref 22–29)
Calcium: 8.7 mg/dL (ref 8.4–10.4)
Creatinine: 1.8 mg/dL — ABNORMAL HIGH (ref 0.6–1.1)
Glucose: 100 mg/dl (ref 70–140)
Total Bilirubin: 1.16 mg/dL (ref 0.20–1.20)

## 2012-12-01 MED ORDER — DIPHENHYDRAMINE HCL 50 MG/ML IJ SOLN: 25.0000 mg | Freq: Once | INTRAMUSCULAR | Status: DC

## 2012-12-01 MED ORDER — SODIUM CHLORIDE 0.9 % IV SOLN
250.0000 mL | Freq: Once | INTRAVENOUS | Status: AC
  Administered 2012-12-01: 250 mL via INTRAVENOUS

## 2012-12-01 MED ORDER — ACETAMINOPHEN 325 MG PO TABS
650.0000 mg | ORAL_TABLET | Freq: Once | ORAL | Status: AC
Start: 2012-12-01 — End: 2012-12-01
  Administered 2012-12-01: 650 mg via ORAL

## 2012-12-01 MED ORDER — DIPHENHYDRAMINE HCL 25 MG PO TABS
25.0000 mg | ORAL_TABLET | Freq: Once | ORAL | Status: AC
  Administered 2012-12-01: 25 mg via ORAL
  Filled 2012-12-01: qty 1

## 2012-12-01 NOTE — Patient Instructions (Addendum)
Blood Transfusion Information WHAT IS A BLOOD TRANSFUSION? A transfusion is the replacement of blood or some of its parts. Blood is made up of multiple cells which provide different functions.  Red blood cells carry oxygen and are used for blood loss replacement.  White blood cells fight against infection.  Platelets control bleeding.  Plasma helps clot blood.  Other blood products are available for specialized needs, such as hemophilia or other clotting disorders. BEFORE THE TRANSFUSION  Who gives blood for transfusions?   You may be able to donate blood to be used at a later date on yourself (autologous donation).  Relatives can be asked to donate blood. This is generally not any safer than if you have received blood from a stranger. The same precautions are taken to ensure safety when a relative's blood is donated.  Healthy volunteers who are fully evaluated to make sure their blood is safe. This is blood bank blood. Transfusion therapy is the safest it has ever been in the practice of medicine. Before blood is taken from a donor, a complete history is taken to make sure that person has no history of diseases nor engages in risky social behavior (examples are intravenous drug use or sexual activity with multiple partners). The donor's travel history is screened to minimize risk of transmitting infections, such as malaria. The donated blood is tested for signs of infectious diseases, such as HIV and hepatitis. The blood is then tested to be sure it is compatible with you in order to minimize the chance of a transfusion reaction. If you or a relative donates blood, this is often done in anticipation of surgery and is not appropriate for emergency situations. It takes many days to process the donated blood. RISKS AND COMPLICATIONS Although transfusion therapy is very safe and saves many lives, the main dangers of transfusion include:   Getting an infectious disease.  Developing a  transfusion reaction. This is an allergic reaction to something in the blood you were given. Every precaution is taken to prevent this. The decision to have a blood transfusion has been considered carefully by your caregiver before blood is given. Blood is not given unless the benefits outweigh the risks. AFTER THE TRANSFUSION  Right after receiving a blood transfusion, you will usually feel much better and more energetic. This is especially true if your red blood cells have gotten low (anemic). The transfusion raises the level of the red blood cells which carry oxygen, and this usually causes an energy increase.  The nurse administering the transfusion will monitor you carefully for complications. HOME CARE INSTRUCTIONS  No special instructions are needed after a transfusion. You may find your energy is better. Speak with your caregiver about any limitations on activity for underlying diseases you may have. SEEK MEDICAL CARE IF:   Your condition is not improving after your transfusion.  You develop redness or irritation at the intravenous (IV) site. SEEK IMMEDIATE MEDICAL CARE IF:  Any of the following symptoms occur over the next 12 hours:  Shaking chills.  You have a temperature by mouth above 102 F (38.9 C), not controlled by medicine.  Chest, back, or muscle pain.  People around you feel you are not acting correctly or are confused.  Shortness of breath or difficulty breathing.  Dizziness and fainting.  You get a rash or develop hives.  You have a decrease in urine output.  Your urine turns a dark color or changes to pink, red, or brown. Any of the following  symptoms occur over the next 10 days:  You have a temperature by mouth above 102 F (38.9 C), not controlled by medicine.  Shortness of breath.  Weakness after normal activity.  The white part of the eye turns yellow (jaundice).  You have a decrease in the amount of urine or are urinating less often.  Your  urine turns a dark color or changes to pink, red, or brown. Document Released: 03/21/2000 Document Revised: 06/16/2011 Document Reviewed: 11/08/2007 [facility] Patient Information 2014 [employer].

## 2012-12-01 NOTE — Progress Notes (Signed)
Hgb 7.7 today, per MD hold chemotherapy and transfuse 2 units RBC's.

## 2012-12-02 LAB — TYPE AND SCREEN: Unit division: 0

## 2012-12-03 ENCOUNTER — Ambulatory Visit (INDEPENDENT_AMBULATORY_CARE_PROVIDER_SITE_OTHER): Payer: Medicare Other | Admitting: Internal Medicine

## 2012-12-03 ENCOUNTER — Encounter: Payer: Self-pay | Admitting: Internal Medicine

## 2012-12-03 VITALS — BP 130/80 | HR 74 | Ht 65.5 in | Wt 202.8 lb

## 2012-12-03 DIAGNOSIS — M3130 Wegener's granulomatosis without renal involvement: Secondary | ICD-10-CM

## 2012-12-03 DIAGNOSIS — E119 Type 2 diabetes mellitus without complications: Secondary | ICD-10-CM

## 2012-12-03 NOTE — Progress Notes (Signed)
04/03/11- 79 year old female former smoking history referred courtesy of Dr. Elease Hashimoto with concern of abnormal chest CT. Husband is an allergy patient here. Dr. Erick Blinks recent office note reviewed "Followup from recent pneumonia. Patient had presented here with extreme myalgias and chronic pain along with bilateral shoulder hip pain. Very high sedimentation rate of 120.  She almost total resolution of myalgias with low dose prednisone strongly suggesting polymyalgia rheumatica. She subsequently presented with cough with trace of blood but no dyspnea, fever, reported a pain. Chest x-ray showed bilateral infiltrates suggesting probable bilateral pneumonia the recommendation for CT scan. CT scan revealed no adenopathy and no evidence for a lung mass. Compatible with bilateral pneumonia. Patient placed on antibiotic and much better at this time with no fever and essentially no cough. Her muscle and joint pains are almost 100% resolved with low-dose prednisone. She did present back in September with severe sinus disease suggesting acute sinusitis. CT revealed no acute findings. She denies any arthralgias at this time. No skin rash. No fever." She has been on prednisone 10 mg twice daily since December 13 and feels significantly better. Morning cough has cleared with residual trace phlegm and trace blood. No chest pain. CT chest 03/23/2011 -images reviewed by me-bilateral patchy airspace disease with air bronchograms. No cavitation or definite nodularity seen. She has had right total knee replacement but no generalized arthritis. Childhood exposure to an uncle with tuberculosis. Her PPD skin test was negative. She denies history of lung disease, GERD, DVT, kidney or liver disease. Lab- C-ANCA POS 1:320, P-ANCA NEG. RA 1:7, ANA NEG68 year old female former smoking history referred courtesy of Dr. Elease Hashimoto with concern of abnormal chest CT. Husband is an allergy patient here.  05/13/11- 66 yoF former smoker  followed for bilateral pulmonary infiltrates, incr C-ANCA, s/p VATS bx/ organizing pneumonia, Metastatic intestinal carcinoid. Marland Kitchen Hx breast Ca 2006/ Dr Truddie Coco Husband here. She returns now after left lung biopsy. She has expected left thoracotomy pain but is otherwise stable as she continues maintenance prednisone. Denies blood, fever, swollen glands, discolored sputum. Exertional dyspnea is not worse, allowing for the surgery. I shared with her and her husband the available initial pathology: Patchy organizing pneumonia with chronic interstitial pneumonia and focal atypical glands. I reviewed the discussion about her at thoracic oncology conference. The parenchymal lung process is not a vasculitis as I originally questioned. I told her there was an incidental finding of cancer cells with further identification pending but that this was a separate process. We agreed to refer her to Dr. Truddie Coco who managed her breast cancer. After she had left, personal communication from Dr. Jimmy Footman- neuroendocrine tumor staining consistent with metastatic intestinal carcinoid.  06/20/11  28 yoF former smoker followed for bilateral pulmonary infiltrates, incr C-ANCA, s/p VATS bx/ organizing pneumonia, Metastatic intestinal carcinoid. Marland Kitchen Hx breast Ca 2006/ Dr Truddie Coco   Husband here She says her breathing is now "fine" back to her normal. She denies fever, cough, sputum. I spoke with Mrs. Heward myself and had to explain the discovery of carcinoid after her lung biopsy. We also have the note from the [facility] indicating they discussed it with her. She has absolutely no recollection. Little cough or shortness of breath now. Still a little posterior thoracotomy incision pain. CT 05/19/11- images reviewed with them- IMPRESSION:  1. Marked improvement in bilateral air space disease seen on  comparison CT of 03/24/2011. Near complete resolution.  2. Postoperative change in the left hemithorax consistent with  recent VATS.   Original Report  Authenticated By: Suzy Bouchard, M.D.   11/07/11- 57 yoF former smoker followed for bilateral pulmonary infiltrates, incr C-ANCA, s/p VATS bx/ organizing pneumonia, Metastatic intestinal carcinoid. Marland Kitchen Hx breast Ca 2006/ Dr Truddie Coco    c/o  Chronic vertigo, difficulty breathing and gets tired easy. Also some wheezing, post nasal,  and cough.  Some days she feels much better than others, no real pattern. Occasional light cough to clear throat. We had called in prednisone, which did help. Ambulatory around home, but gets out little. CT chest 09/09/11- I reviewed w/ her- there is significant patchy bilateral lower zone fibrotic scarring and some bronchial thickening. IMPRESSION:  1. No evidence of thoracic metastatic disease or other active  process.  2. Small mild cardiomegaly and hiatal hernia.  Original Report Authenticated By: Marlaine Hind, M.D.    02/09/12-  27 yoF former smoker followed for bilateral pulmonary infiltrates, incr C-ANCA, s/p VATS bx/ cryptogenic organizing pneumonia/ BOOP, Metastatic intestinal carcinoid. Marland Kitchen Hx breast Ca 2006/ Dr Truddie Coco  January had biopsy to ck for other lung condition. SOB and wheezing at times. Left sided pain around the  Back. Continues Advair 250. Due for followup chest CT in December for Dr. Jacquiline Doe. Not on prednisone. She says breathing is unchanged. Denies pain, blood or swollen nodes.   05/11/12-  89 yoF former smoker followed for bilateral pulmonary infiltrates, incr C-ANCA, s/p VATS bx/ cryptogenic organizing pneumonia/ BOOP, Metastatic intestinal carcinoid. Complicated by Hx breast Ca 2006/ Dr Truddie Coco. AFib/ Dr Martinique, DM. She says her husband is developing dementia. Deep breath makes her left flank feel tight where she had thoracentesis. Complains of itching since starting Eliquis.Atarax 50 mg was too strong and Benadryl did not help. Itching keeps her awake all night. No visible rash. She is convinced really unpleasant itching began when  she started Eliquis. I explained this medication needs to be transitioned carefully to anything else and Dr. Martinique would need to be the one to manage that. CT chest 03/26/12 . IMPRESSION:  1. No findings to strongly suggest metastatic disease to the  thorax.  2. There is a new focus of nodular ground-glass attenuation  measuring 1 cm in the anterior aspect of the left upper lobe which  is highly nonspecific. Initial follow-up by chest CT without  contrast is recommended in 3 months to confirm persistence. This  recommendation follows the consensus statement: Recommendations for  the Management of Subsolid Pulmonary Nodules Detected at CT: A  Statement from the [employer]ischner Society as published in Radiology  2013; 266:304-317.  3. Atherosclerosis, including left main and three-vessel coronary  artery disease. Assessment for potential risk factor  modification, dietary therapy or pharmacologic therapy may be  warranted, if clinically indicated.  4. Slight increase in the small amount of pericardial fluid  compared to the prior examination. This is unlikely to be of  hemodynamic significance at this time.  5. Moderate cardiomegaly.  6. Additional incidental findings, similar to prior studies, as  discussed above.  Original Report Authenticated By: Vinnie Langton, M.D.   08/08/12- 15 yoF former smoker followed for bilateral pulmonary infiltrates/ nodules, incr C-ANCA, s/p VATS bx/ cryptogenic organizing pneumonia/ BOOP, Metastatic intestinal carcinoid. Complicated by Hx breast Ca 2006/ Dr Truddie Coco. AFib/ Dr Martinique, DM. Itching stopped, while continuing Eliquis. She had changed detergents and fabric softener, but cause unknown. Seasonal rhinitis symptoms now. Total IgE 05/11/2012 was less than 1.5. New burning itch, intermittent, right lateral ribs x2 weeks. Has had shingles shot. No visible rash. CT chest  08/02/12 IMPRESSION:  Interval persistence but decrease in size of the ground-glass   nodules seen in the anterior left upper lobe. Adenocarcinoma  cannot be excluded. Followup by CT is recommended in 12 months,  with continued annual surveillance for a minimum of 3 years.  These recommendations are taken from "Recommendations for the  Management of Subsolid Pulmonary Nodules Detected at CT: A  Statement from the [employer]" Radiology 2013; 266:1, 304-  317.  Original Report Authenticated By: Misty Stanley, M.D.   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 Relapse since 2013 11/02/12 PR-3 880. MPO negative. - GBM neg Likely - capillaritis. 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 Admitted with hemoptysis  hemoptysis with CT scan chest findings of alveolar hemorrhage  As of June 2014 the oncologist did not feel there was any evidence of breast cancer recurrence.CT scan of the chest ruled out pulmonary embolism but showed severe bilateral groundglass opacities consistent with alveolar hemorrhage pattern. Pulmonary consultation was called. She was initially treated by stopping eliquis, providing empiric antibiotics and supported with diuresis.  Pulmonary  felt The clinical picture was felt to be consistent with diffuse alveolar hemorrhage resulting in hemoptysis. Most likely cause of alveolar hemorrhage was felt to be autoimmune based on past history of autoimmune antibody positivity and lung biopsy showing organizing pneumonia in December 2012/January 2013. Prior C.-ANCA positive though no evidence of vasculitis in Jan 2013 lung bx which is puzzling. Alternative is relapsing BOOP but the hemoptysis would be unsual unless precipitated by Eliquis. On 11/02/12 PR-3 880. Nephrology consult  On  7/29 immunosuppression therapy was initiated. She was given Pulse methylprednisolone, for three days, and her first dose of Rituxan was given on 7/30. The following days we saw significant clinical improvement in regards to hemoptysis, pulmonary infiltrates and dyspnea, She was started on  Rituxan over cyclophosphamide due to malignancy history, and also this is technically a "relapse" or A "flare". Dose starting 11/03/12 will be Induction therapy with rituximab (375 mg/m2 per week for four weeks). W/  PCP prophylaxis w/ - Bactrim 1 DS on M, W, F  Since discharge she is feeling better with resolution of hemoptysis, . Dyspnea is less and she is starting to get some of her energy back.  No fever , orthopnea or edema.  Had labs done last week. , hbg stable at 8.9 , renal fx tr down at 1.6 (max scr 2.4) . Ov with oncology last week. Rituxan  infusion given.   12/03/12- 85 yoF former smoker followed for bilateral pulmonary infiltrates/ nodules, incr C-ANCA, s/p VATS bx/ Granulomatous Polyangiitis (Wegener's) vs cryptogenic organizing pneumonia/ BOOP, Metastatic intestinal carcinoid. Complicated by Hx breast Ca 2006/ Dr Truddie Coco. AFib/ Dr Martinique, DM. Started Rituxan 375 mg/m2x 4 weeks 11/03/12. PCP prophy Bactrim 1DS on M,W,F. FOLLOWS FOR: reports doing well since ov w/ TP.  still doing well on Dulera, but needs a refill.  still taking prednisone 9m daily. 1 dose of Rituxan still pending. Had to have transfusion last week for hemoglobin 7.7. No hemoptysis since a little bit at first recognition of her granulomatous disease. She has continued prednisone 60 mg daily since hospital discharge, and we have discussed tapering the dose. No coughing, no chest pain or fever.  ROS-see HPI Constitutional:   No-   weight loss, night sweats, fevers, chills, fatigue, lassitude. HEENT:   No-  headaches, difficulty swallowing, tooth/dental problems, sore throat,       No-  sneezing, +itching, ear ache, nasal congestion, post  nasal drip,  CV: +chest pain, orthopnea, PND, swelling in lower extremities, anasarca,   + chronic dizziness,   No-palpitations Resp: +   shortness of breath with exertion or at rest.           No- productive cough,  No non-productive cough,               No-   change in color of mucus.  No- wheezing.   Skin: No-   rash or lesions. no- pruritus GI:  No-   heartburn, indigestion, abdominal pain, nausea, vomiting,  GU: MS:  No-   joint pain or swelling.  Neuro-     +Vertigo- uses wheelchair Psych:  No- change in mood or affect. No depression or anxiety.  No memory loss.  OBJ General- Alert, Oriented, Affect-appropriate, Distress- none acute, overweight, wheelchair Skin- rash-none, lesions- none, excoriation- none Lymphadenopathy- none Head- atraumatic            Eyes- Gross vision intact, PERRLA, conjunctivae -pale            Ears- Hearing, canals-normal            Nose- turbinate edema, no-Septal dev, mucus, polyps, erosion, perforation             Throat- Mallampati II-III , mucosa clear , drainage- none, tonsils- atrophic Neck- flexible , trachea midline, no stridor , thyroid nl, carotid no bruit Chest - symmetrical excursion , unlabored           Heart/CV-+ irregularly irregular , 2-3/6 precordial systolic murmur , no gallop  , no rub, nl s1 s2                           - JVD- none , edema+1, stasis changes- none, varices- none           Lung- clear, no wheeze , cough- none , dullness-none, rub- none           Chest wall- healed VATS thoracotomy incision L Abd-  Br/ Gen/ Rectal- Not done, not indicated Extrem- cyanosis- none, clubbing, none, atrophy- none, strength- nl. Wheelchair for distance/ cane at home.  Neuro- grossly intact to observation

## 2012-12-03 NOTE — Assessment & Plan Note (Signed)
Reducing prednisone dose now is expected to help blood sugar control as discussed.

## 2012-12-03 NOTE — Patient Instructions (Addendum)
Reduce prednisone to 2 x 20 mg tabs = 40 mg daily until I see you next time   My staff will check CBG for you while you are here today because you have Diabetes and are taking prednisone.   Dr Magrinat's office will make decisions about your Rituxan   Please call as needed

## 2012-12-03 NOTE — Assessment & Plan Note (Signed)
She has one more dose of Rituxan. No hemoptysis although she has resumed Eliquis for AFib Plan-reduced prednisone now to 40 mg daily until return in one month.

## 2012-12-07 ENCOUNTER — Telehealth: Payer: Self-pay | Admitting: Family Medicine

## 2012-12-07 ENCOUNTER — Other Ambulatory Visit: Payer: Self-pay | Admitting: Family

## 2012-12-07 DIAGNOSIS — M3130 Wegener's granulomatosis without renal involvement: Secondary | ICD-10-CM

## 2012-12-07 NOTE — Telephone Encounter (Signed)
Left detailed message on patient VM, and samples will be at the front for pickup. No samples of Azor and only 2 samples of Eliquis

## 2012-12-07 NOTE — Telephone Encounter (Signed)
Pt needs samples of bystolic 10 mg,azor 4-25 mg and eliquis 5 mg

## 2012-12-08 ENCOUNTER — Other Ambulatory Visit (HOSPITAL_BASED_OUTPATIENT_CLINIC_OR_DEPARTMENT_OTHER): Payer: Medicare Other | Admitting: Lab

## 2012-12-08 ENCOUNTER — Ambulatory Visit (HOSPITAL_BASED_OUTPATIENT_CLINIC_OR_DEPARTMENT_OTHER): Payer: Medicare Other

## 2012-12-08 ENCOUNTER — Ambulatory Visit (HOSPITAL_BASED_OUTPATIENT_CLINIC_OR_DEPARTMENT_OTHER): Payer: Medicare Other | Admitting: Family

## 2012-12-08 ENCOUNTER — Encounter: Payer: Self-pay | Admitting: Family

## 2012-12-08 VITALS — BP 148/72 | HR 79 | Temp 98.3°F | Resp 18

## 2012-12-08 VITALS — BP 174/90 | HR 90 | Temp 98.3°F | Resp 20 | Ht 65.5 in | Wt 204.3 lb

## 2012-12-08 DIAGNOSIS — M3130 Wegener's granulomatosis without renal involvement: Secondary | ICD-10-CM

## 2012-12-08 DIAGNOSIS — Z853 Personal history of malignant neoplasm of breast: Secondary | ICD-10-CM

## 2012-12-08 DIAGNOSIS — E559 Vitamin D deficiency, unspecified: Secondary | ICD-10-CM

## 2012-12-08 DIAGNOSIS — R7401 Elevation of levels of liver transaminase levels: Secondary | ICD-10-CM | POA: Diagnosis not present

## 2012-12-08 DIAGNOSIS — Z5112 Encounter for antineoplastic immunotherapy: Secondary | ICD-10-CM

## 2012-12-08 LAB — CBC WITH DIFFERENTIAL/PLATELET
Basophils Absolute: 0 10*3/uL (ref 0.0–0.1)
Eosinophils Absolute: 0 10*3/uL (ref 0.0–0.5)
HGB: 9.4 g/dL — ABNORMAL LOW (ref 11.6–15.9)
MCV: 88.6 fL (ref 79.5–101.0)
MONO%: 6.7 % (ref 0.0–14.0)
NEUT#: 8.7 10*3/uL — ABNORMAL HIGH (ref 1.5–6.5)
RBC: 3.16 10*6/uL — ABNORMAL LOW (ref 3.70–5.45)
RDW: 15.1 % — ABNORMAL HIGH (ref 11.2–14.5)
WBC: 10.3 10*3/uL (ref 3.9–10.3)
lymph#: 0.9 10*3/uL (ref 0.9–3.3)
nRBC: 0 % (ref 0–0)

## 2012-12-08 LAB — COMPREHENSIVE METABOLIC PANEL (CC13)
ALT: 20 U/L (ref 0–55)
AST: 17 U/L (ref 5–34)
Calcium: 9.3 mg/dL (ref 8.4–10.4)
Chloride: 95 mEq/L — ABNORMAL LOW (ref 98–109)
Creatinine: 1.8 mg/dL — ABNORMAL HIGH (ref 0.6–1.1)
Sodium: 128 mEq/L — ABNORMAL LOW (ref 136–145)
Total Bilirubin: 1.25 mg/dL — ABNORMAL HIGH (ref 0.20–1.20)
Total Protein: 6.9 g/dL (ref 6.4–8.3)

## 2012-12-08 MED ORDER — ACETAMINOPHEN 325 MG PO TABS
650.0000 mg | ORAL_TABLET | Freq: Once | ORAL | Status: AC
  Administered 2012-12-08: 650 mg via ORAL

## 2012-12-08 MED ORDER — SODIUM CHLORIDE 0.9 % IV SOLN
375.0000 mg/m2 | Freq: Once | INTRAVENOUS | Status: AC
  Administered 2012-12-08: 800 mg via INTRAVENOUS
  Filled 2012-12-08: qty 80

## 2012-12-08 MED ORDER — DEXAMETHASONE SODIUM PHOSPHATE 10 MG/ML IJ SOLN
10.0000 mg | Freq: Once | INTRAMUSCULAR | Status: AC
  Administered 2012-12-08: 10 mg via INTRAVENOUS

## 2012-12-08 MED ORDER — DIPHENHYDRAMINE HCL 25 MG PO CAPS
25.0000 mg | ORAL_CAPSULE | Freq: Once | ORAL | Status: AC
  Administered 2012-12-08: 25 mg via ORAL

## 2012-12-08 MED ORDER — SODIUM CHLORIDE 0.9 % IV SOLN: Freq: Once | INTRAVENOUS | Status: DC

## 2012-12-08 NOTE — Progress Notes (Addendum)
[place]  [phone] [phone]  OFFICE PROGRESS NOTE   ID: [name] [name]   DOB: 03-11-1933  MR#: [id number]  BSJ#:[id number]   PCP: [name], MD GYN: Juanda Chance, WHNP-C SU: Alphonsa Overall, MD RAD ONC: Rexene Edison, MD CARDIO: Peter M.  Martinique, MD PULM: Tarri Fuller D. Annamaria Boots, MD NEPHRO:  Roney Jaffe, MD   HISTORY OF PRESENT ILLNESS: From Dr. Collier Salina Rubin's new patient evaluation note dated 10/01/2005: "This is a 79 year old woman referred by Dr. Lucia Gaskins for evaluation and treatment of breast cancer.  This woman undergoes annual screening mammography.  She underwent initial mammogram on 08/06/2005.  This showed a suspicious mass in the left breast.  A diagnostic mammogram performed on 08/19/2005 showed a 5 x 4 x 4 mm spiculated mass with microcalfication of the left anterior upper outer quadrant at 1 o'clock.  No palpable mass was identified.  Ultrasound showed hypoechoic mass with some shadowing and microcalcifications.  Biopsy was performed on 08/19/2005 and this showed an invasive mammary carcinoma intermediate grade, ER/PR positive at 94% and 86% respectively, proliferative index 9%, HER-2/neu was 2+ with no overexpression by FISH.  No polysomy was seen with adjuvant in her chromosome 17 of 3.  MRI scan of both breasts was performed showing a solitary abnormality in the left breast and right breast was normal.  The patient elected to undergo lumpectomy and sentinel lymph node evaluation on 09/09/2005.  The final pathology showed a 0.9 cm grade 3 of 3 invasive ductal cancer.  Lymphovascular invasion was not seen though lymphovascular invasion was identified in the original biopsy.  The solitary lymph node, which was identified, was negative for malignancy.  She has had an unremarkable postoperative course."  Her subsequent history is as detailed below.   INTERVAL HISTORY: [name] [name] was seen today for follow up of granulomatous polyangitis  (Wegener's disease) currently being treated with Rituxan infusions.   It will be recalled that [name] [name] has a history of invasive ductal carcinoma with ductal carcinoma in situ of the left breast and possible neuroendocrine type lung carcinoma.  The patient is accompanied to today's office visit by her husband [name] [name].  The patient was last seen by [place] on 11/24/2012.  Since her last office visit, she was found to be anemic before her scheduled  fourth cycle of Rituxan on 12/01/2012 and received 2 units of packed red blood cells instead of Rituxan infusion on that date.  She is scheduled to receive her fourth Rituxan infusion today.  Her interval history is significant for seeing Dr. Baird Lyons, Pulmonologist on 12/03/2012.  He decreased her Prednisone dose to 40 mg by mouth daily.   REVIEW OF SYSTEMS: A 10 point review of systems was completed and the patient states that her energy is decreased but she does have spurts of energy throughout the day.  She denies any other symptomatology. The patient is sitting in a wheelchair for her office visit today due to her vertigo. The patient  has an ongoing history of vertigo which is controlled when she takes Valium and is the reason she uses a wheelchair. She has tolerated all 3 Rituxan infusions and blood transfusions without difficulty.  The patient denies any other symptomatology including fever or chills, headache, vision changes, swollen glands, cough or shortness of breath, chest pain or discomfort, nausea, vomiting, diarrhea, constipation, change in urinary or bowel habits, arthralgias/myalgias, unusual bleeding/bruising or any other symptomatology and her review of systems is otherwise noncontributory.  PAST MEDICAL HISTORY: Past Medical History  Diagnosis Date  . HYPOTHYROIDISM 07/24/2008  . HYPERLIPIDEMIA 07/24/2008  . DEPRESSION 05/16/2009  . HYPERTENSION 07/24/2008  . Vertigo   . Diabetes mellitus type II   . Arthritis     r tkr  . CVA  (cerebral vascular accident) 2008    mini stroke.no residual  . Heart murmur     stress test 2009.dr peter Martinique  . Intermittent vertigo   . Skin abnormality     facial lesions .pt applying mupiracin to areas  . Atrial fibrillation   . Breast cancer   . Pneumonia   . Wegener's disease, pulmonary 10/2012    PAST SURGICAL HISTORY: Past Surgical History  Procedure Laterality Date  . Knee surgery  2009    TKR  . Cholecystectomy  1980  . Joint replacement      r knee  . Breast surgery  2007    Lumpectomy, XRT 2006.l breast  . Video assisted thoracoscopy  04/22/2011    Procedure: VIDEO ASSISTED THORACOSCOPY;  Surgeon: Melrose Nakayama, MD;  Location: [facility];  Service: Thoracic;  Laterality: Left;  WITH BIOPSY  . Heel spur surgery Right   . Exploratory laparotomy       FAMILY HISTORY Family History  Problem Relation Age of Onset  . Arthritis Mother   . Rheum arthritis Mother   . Heart disease Father   . Coronary artery disease Father   . Cancer Sister     breast CA, both sisters  . Breast cancer Sister   . Coronary artery disease Brother   . Lung cancer Brother   Both parents both deceased.  She had one brother who died of lung cancer and another brother who died from pancreatic cancer.   She has had two sisters with breast cancer, one dying at age 44.  One sister was diagnosed at age 15 and it recurred 38 years later.    GYNECOLOGIC HISTORY: Gravida 3, para 3.    Menarche age 59 and menopause in her 63s.  She was on hormone replacement therapy with Premarin, Provera for 15-20 years.  SOCIAL HISTORY: [name] [name] have been married since 37.  They have 3 adult sons, 7 grandchildren, and 6 great-grandchildren.   She has retired from [employer] as a Administrator.  Her husband is also retired from [employer] in 1996. In her spare time the patient enjoys cooking/baking and reading.  They have a toy poodle named "[employer]    ADVANCED  DIRECTIVES: Not on file   HEALTH MAINTENANCE: History  Substance Use Topics  . Smoking status: Former Smoker -- 0.50 packs/day for 12 years    Types: Cigarettes    Quit date: 06/04/1982  . Smokeless tobacco: Never Used  . Alcohol Use: No    Colonoscopy: 05/05/2003 PAP: Not on file Bone density: The patient's last bone density scan on 10/16/2011 showed a T score of -1.8 (osteopenia). Lipid panel: Not on file   Allergies  Allergen Reactions  . Codeine Sulfate     REACTION: GI upset  . Sulfonamide Derivatives     REACTION: GI upset  . Atarax [Hydroxyzine] Nausea Only and Rash    Current Outpatient Prescriptions  Medication Sig Dispense Refill  . apixaban (ELIQUIS) 5 MG TABS tablet Take 1 tablet (5 mg total) by mouth 2 (two) times daily.  60 tablet  6  . AZOR 5-40 MG per tablet TAKE ONE TABLET BY MOUTH EVERY DAY  90 tablet  3  . BYSTOLIC 10 MG tablet TAKE ONE TABLET BY MOUTH EVERY DAY  90 tablet  3  . CALCIUM CITRATE PO Take 1,200 mg by mouth daily.        . cholecalciferol (VITAMIN D) 1000 UNITS tablet Take 3,000 Units by mouth daily.        . diazepam (VALIUM) 5 MG tablet TAKE ONE TABLET BY MOUTH EVERY DAY AS NEEDED.  30 tablet  0  . fluticasone (FLONASE) 50 MCG/ACT nasal spray Place 2 sprays into the nose daily as needed.      . gabapentin (NEURONTIN) 100 MG capsule Take 1 capsule (100 mg total) by mouth 2 (two) times daily before a meal.  60 capsule  6  . glipiZIDE (GLUCOTROL) 10 MG tablet TAKE ONE TABLET BY MOUTH TWICE DAILY  180 tablet  3  . levothyroxine (SYNTHROID, LEVOTHROID) 125 MCG tablet TAKE ONE TABLET BY MOUTH EVERY DAY  90 tablet  3  . loratadine (CLARITIN) 10 MG tablet Take 10 mg by mouth daily.      . metFORMIN (GLUCOPHAGE-XR) 500 MG 24 hr tablet TAKE TWO TABLETS BY MOUTH TWICE DAILY  120 tablet  11  . mometasone-formoterol (DULERA) 100-5 MCG/ACT AERO Inhale 2 puffs into the lungs 2 (two) times daily.  1 Inhaler  6  . multivitamin (METANX) 3-35-2 MG TABS tablet  TAKE ONE TABLET BY MOUTH EVERY DAY  90 tablet  3  . ondansetron (ZOFRAN) 4 MG tablet Take 4 mg by mouth every 8 (eight) hours as needed for nausea.      . predniSONE (DELTASONE) 20 MG tablet Take 40 mg by mouth daily with breakfast.      . Pyridoxine HCl (VITAMIN B-6 PO) Take 100 mg by mouth daily.       . rosuvastatin (CRESTOR) 5 MG tablet Take 1 tablet (5 mg total) by mouth daily.  30 tablet  11  . sulfamethoxazole-trimethoprim (BACTRIM DS) 800-160 MG per tablet 1 tab three times a week. (Monday, Wednesday and Friday)  15 tablet  6  . triamcinolone cream (KENALOG) 0.1 % Apply 1 application topically 2 (two) times daily as needed. Compound 1:1 with Eucerin cream       No current facility-administered medications for this visit.   Facility-Administered Medications Ordered in Other Visits  Medication Dose Route Frequency Provider Last Rate Last Dose  . 0.9 %  sodium chloride infusion   Intravenous Once Chauncey Cruel, MD        OBJECTIVE: Filed Vitals:   12/08/12 0854  BP: 174/90  Pulse: 90  Temp: 98.3 F (36.8 C)  Resp: 20     Body mass index is 33.47 kg/(m^2).       ECOG FS: 2 - Symptomatic, <50% confined to bed  General appearance: Alert, cooperative, well nourished, no apparent distress, in a wheelchair (due to vertigo) Head: Normocephalic, without obvious abnormality, atraumatic Eyes: Arcus senilis, PERRLA, EOMI Nose: Nares, septum and mucosa are normal, no drainage or sinus tenderness Neck: No adenopathy, supple, symmetrical, trachea midline, thyroid not enlarged, no tenderness Resp: Clear to auscultation bilaterally , diminished right lower lobe breath sounds Cardio: Irregularly irregular, no murmur, no click, rub or gallop Breasts: Deferred GI: Soft, distended, non-tender, hypoactive bowel sounds, no organomegaly from sitting position Skin: Bilateral upper extremity ecchymoses in various stages of healing Extremities: Extremities normal, atraumatic, no cyanosis or  edema Lymph nodes: Cervical, supraclavicular, and axillary nodes normal Neurologic: Grossly normal    LAB RESULTS: Lab  Results  Component Value Date   WBC 10.3 12/08/2012   NEUTROABS 8.7* 12/08/2012   HGB 9.4* 12/08/2012   HCT 28.0* 12/08/2012   MCV 88.6 12/08/2012   PLT 155 12/08/2012      Chemistry      Component Value Date/Time   NA 128* 12/08/2012 0825   NA 133* 11/07/2012 0610   NA 136 09/09/2011 0939   K 4.8 12/08/2012 0825   K 3.7 11/07/2012 0610   K 4.7 09/09/2011 0939   CL 96 11/07/2012 0610   CL 98 08/02/2012 0824   CL 95* 09/09/2011 0939   CO2 20* 12/08/2012 0825   CO2 28 11/07/2012 0610   CO2 29 09/09/2011 0939   BUN 28.1* 12/08/2012 0825   BUN 61* 11/07/2012 0610   BUN 17 09/09/2011 0939   CREATININE 1.8* 12/08/2012 0825   CREATININE 1.81* 11/07/2012 0610   CREATININE 1.0 09/09/2011 0939      Component Value Date/Time   CALCIUM 9.3 12/08/2012 0825   CALCIUM 8.8 11/07/2012 0610   CALCIUM 9.0 09/09/2011 0939   ALKPHOS 42 12/08/2012 0825   ALKPHOS 93 11/03/2012 0310   ALKPHOS 45 09/09/2011 0939   AST 17 12/08/2012 0825   AST 21 11/03/2012 0310   AST 26 09/09/2011 0939   ALT 20 12/08/2012 0825   ALT 12 11/03/2012 0310   ALT 19 09/09/2011 0939   BILITOT 1.25* 12/08/2012 0825   BILITOT 1.1 11/03/2012 0310   BILITOT 0.60 09/09/2011 0939      Lab Results  Component Value Date   LABCA2 28 09/09/2011    Urinalysis    Component Value Date/Time   COLORURINE AMBER* 11/02/2012 2113   APPEARANCEUR CLOUDY* 11/02/2012 2113   LABSPEC 1.026 11/02/2012 2113   PHURINE 5.5 11/02/2012 2113   GLUCOSEU NEGATIVE 11/02/2012 2113   HGBUR LARGE* 11/02/2012 2113   BILIRUBINUR SMALL* 11/02/2012 2113   BILIRUBINUR n 12/30/2010 1700   KETONESUR NEGATIVE 11/02/2012 2113   PROTEINUR 100* 11/02/2012 2113   UROBILINOGEN 1.0 11/02/2012 2113   UROBILINOGEN 0.2 12/30/2010 1700   NITRITE NEGATIVE 11/02/2012 2113   NITRITE n 12/30/2010 1700   LEUKOCYTESUR NEGATIVE 11/02/2012 2113    STUDIES: 1.  The patient's last bilateral digital diagnostic  mammogram on 06/17/2012 showed a magnification view of the lumpectomy site and routine views of both breasts again demonstrate scarring within the upper outer left breast. There is no evidence of suspicious mass, nonsurgical distortion or worrisome calcifications bilaterally.  Benign findings.  2.  Dg Chest 2 View 11/19/2012   *RADIOLOGY REPORT*  Clinical Data: Follow up Wegener's granulomatosis  CHEST - 2 VIEW  Comparison: 11/08/2012  Findings: The cardiac shadow remains enlarged.  Coarsened interstitial changes are again noted throughout both lungs.  No focal infiltrate or sizable effusion is seen.  IMPRESSION: No change from prior exam.  No acute abnormality is noted.   Original Report Authenticated By: Inez Catalina, M.D.      ASSESSMENT: [name] [name] is a 79 y.o. [place], [place] woman: 1.  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  2.  Oncotype DX report dated 10/13/2005 showed a recurrent score of 19 with average rate of distant recurrence at  12%.  3.  Status post radiation therapy from 11/11/2005 through 12/26/2005.  4.  The patient started antiestrogen therapy with Arimidex in 01/2006.  The patient also started Fosamax at that time.  She completed antiestrogen therapy in 01/2011.  5.  The patient was being treated by her primary care physician for pneumonia in 04/2011. She was subsequently seen by a pulmonologist who in turn was concerned about Wegener's disease. He referred her for a lung biopsy. VATS lung biopsy was performed by Dr. Roxan Hockey on 04/22/2011. The patient had a PET scan on 05/21/2011 which showed no clear evidence of  malignancy.  Uptake along the left lateral chest wall was likely related to surgery.  Interval improvement of airspace disease within the lungs.  No evidence of malignancy within the abdomen or pelvis.    6.  Dr. Truddie Coco noted that the patient did have evidence of atypical glandular cells in her pathology from the lung biopsy.  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  Her last CT of the chest with contrast on 08/02/2012 showed an interval persistence but decrease in size of the ground-glass nodules seen in the anterior left upper lobe.  Adenocarcinoma cannot be excluded.  Followup by CT is recommended in 12 months, with continued annual surveillance for a minimum of 3 years.  7.  Hospitalization from 11/01/2012 through 11/07/2012 for pneumonia and granulomatous polyangiitis (Wegener's disease).  8.  Steroid-induced hyperglycemia and leukocytosis.   PLAN: She will proceed with Rituxan infusion #4 of 4 planned infusions today.  She received her first infusion of Rituxan on 11/03/2012 while hospitalized, her second Rituxan infusion on 11/17/2012, her third Rituxan infusion on 11/24/2012.  Her fourth Rituxan infusion was delayed one week secondary to anemia (hemoglobin/hematocrit at 7.7/23.3 respectively).  She received a blood transfusion of 2 units of packed red blood cells on 12/01/2012 instead of Rituxan infusion.  She has tolerated all of the infusions/transfusions well.  She is scheduled to followup with Pulmonologist Dr. Chase Caller on 12/15/2012.   We plan to see her again for an office visit and laboratories in approximately 6 weeks.    [name] [name] is currently on a slow prednisone taper with the goal of reaching 20 mg per day by the end of 2 months per Dr. Elsworth Soho, Pulmonologist.  Subsequently, she has steroid-induced hyperglycemia and leukocytosis.  The patient has been instructed how to adjust her diabetes medications for prednisone taper.  With regards to her breast  cancer, we still plan to see the patient again in one year (09/2013).  We will check laboratories of CBC, CMP, LDH, and vitamin D level at that time.  [name] [name] is scheduled for her annual bilateral digital diagnostic mammogram in 06/2013. She is also scheduled to have a follow up CT of the chest with contrast in 09/2013.  Her last CT of the chest with contrast on 08/02/2012 recommended annual surveillance of the nodules seen in the anterior left upper lobe for a minimum of 3 years.    All questions were answered.  The patient and her husband were encouraged to contact us in the interim with any problems, questions or concerns.   Ailene Ards, NP-C 12/08/2012, 6:04 PM  ADDENDUM: [name] [name] tolerated her Rituxan infusions without any complications. Further followup of her Wegener's of course will be through pulmonology. We will be glad to participate in treatment of that disease at your discretion.  I am not sure why her LDH is slightly up at this time.  From our point of view this requires only followup.  As far as breast cancer is concerned she has an excellent prognosis. She will see [place] again in June of 2015. She knows to call for any problems that may develop before that date.  I personally saw this patient and performed a substantive portion of this encounter with the listed APP documented above.   Chauncey Cruel, MD

## 2012-12-08 NOTE — Patient Instructions (Signed)
[facility] Discharge Instructions for Patients Receiving Chemotherapy  Today you received the following chemotherapy agents: Rituxan. To help prevent nausea and vomiting after your treatment, we encourage you to take your nausea medication.   If you develop nausea and vomiting that is not controlled by your nausea medication, call the clinic.   BELOW ARE SYMPTOMS THAT SHOULD BE REPORTED IMMEDIATELY:  *FEVER GREATER THAN 100.5 F  *CHILLS WITH OR WITHOUT FEVER  NAUSEA AND VOMITING THAT IS NOT CONTROLLED WITH YOUR NAUSEA MEDICATION  *UNUSUAL SHORTNESS OF BREATH  *UNUSUAL BRUISING OR BLEEDING  TENDERNESS IN MOUTH AND THROAT WITH OR WITHOUT PRESENCE OF ULCERS  *URINARY PROBLEMS  *BOWEL PROBLEMS  UNUSUAL RASH Items with * indicate a potential emergency and should be followed up as soon as possible.  Feel free to call the clinic you have any questions or concerns. The clinic phone number is [phone].

## 2012-12-08 NOTE — Patient Instructions (Addendum)
Please contact us at [phone] if you have any questions or concerns.  Please continue to do well and enjoy life!!!  Get plenty of rest, drink plenty of water, exercise daily (walking), eat a balanced diet.  Continue to take calcium and vitamin D3 1000 IUs daily.   Dg Chest 2 View 11/19/2012   *RADIOLOGY REPORT*  Clinical Data: Follow up Wegener's granulomatosis  CHEST - 2 VIEW  Comparison: 11/08/2012  Findings: The cardiac shadow remains enlarged.  Coarsened interstitial changes are again noted throughout both lungs.  No focal infiltrate or sizable effusion is seen.  IMPRESSION: No change from prior exam.  No acute abnormality is noted.   Original Report Authenticated By: Inez Catalina, M.D.    Results for orders placed in visit on 12/08/12 (from the past 24 hour(s))  CBC WITH DIFFERENTIAL     Status: Abnormal   Collection Time    12/08/12  8:26 AM      Result Value Range   WBC 10.3  3.9 - 10.3 10e3/uL   NEUT# 8.7 (*) 1.5 - 6.5 10e3/uL   HGB 9.4 (*) 11.6 - 15.9 g/dL   HCT 28.0 (*) 34.8 - 46.6 %   Platelets 155  145 - 400 10e3/uL   MCV 88.6  79.5 - 101.0 fL   MCH 29.7  25.1 - 34.0 pg   MCHC 33.6  31.5 - 36.0 g/dL   RBC 3.16 (*) 3.70 - 5.45 10e6/uL   RDW 15.1 (*) 11.2 - 14.5 %   lymph# 0.9  0.9 - 3.3 10e3/uL   MONO# 0.7  0.1 - 0.9 10e3/uL   Eosinophils Absolute 0.0  0.0 - 0.5 10e3/uL   Basophils Absolute 0.0  0.0 - 0.1 10e3/uL   NEUT% 84.1 (*) 38.4 - 76.8 %   LYMPH% 8.7 (*) 14.0 - 49.7 %   MONO% 6.7  0.0 - 14.0 %   EOS% 0.4  0.0 - 7.0 %   BASO% 0.1  0.0 - 2.0 %   nRBC 0  0 - 0 %   Narrative:    Performed At:  [facility]               [address]               [place], [place] [address]

## 2012-12-09 ENCOUNTER — Telehealth: Payer: Self-pay | Admitting: *Deleted

## 2012-12-09 ENCOUNTER — Telehealth: Payer: Self-pay | Admitting: Cardiology

## 2012-12-09 LAB — VITAMIN D 25 HYDROXY (VIT D DEFICIENCY, FRACTURES): Vit D, 25-Hydroxy: 36 ng/mL (ref 30–89)

## 2012-12-09 NOTE — Telephone Encounter (Signed)
Returned call to patient samples of Eliquis 5 mg left at 3rd floor front desk.

## 2012-12-09 NOTE — Telephone Encounter (Signed)
sw pt gv appt for 02/08/13 w/ labs@ 10am and ov@ 10:30am. Pt is aware that i will also mail her a letter/cal...td

## 2012-12-09 NOTE — Telephone Encounter (Signed)
PT CALLING RE SAMPLES OF ELIQUIS, WAS TOLD THEY WERE COMING AND SOME WOULD BE SAVED FOR HER, PLS  CALL

## 2012-12-15 ENCOUNTER — Inpatient Hospital Stay: Payer: Medicare Other | Admitting: Internal Medicine

## 2012-12-15 ENCOUNTER — Ambulatory Visit: Payer: Medicare Other | Admitting: Internal Medicine

## 2012-12-20 ENCOUNTER — Other Ambulatory Visit: Payer: Self-pay | Admitting: Family Medicine

## 2012-12-20 ENCOUNTER — Other Ambulatory Visit: Payer: Self-pay | Admitting: *Deleted

## 2012-12-20 MED ORDER — NYSTATIN POWD: Status: DC

## 2012-12-20 NOTE — Telephone Encounter (Signed)
Pt called to this RN to state noted rash " under my breast " " I don't wear a bra at home and I think it is from where my breast lay against my skin ". Per call pt is inquiring if she can use corn starch for benefit.  Per discussion this RN informed pt probable more fungal therefore cornstarch could make worse.  Reviewed pt's symptom with AB/PA who recommended antifungal cream.  Pt verbalized understanding - prescription sent to pharmacy.

## 2012-12-23 ENCOUNTER — Other Ambulatory Visit: Payer: Self-pay

## 2012-12-23 ENCOUNTER — Emergency Department (HOSPITAL_COMMUNITY): Payer: Medicare Other

## 2012-12-23 ENCOUNTER — Encounter (HOSPITAL_COMMUNITY): Payer: Self-pay | Admitting: *Deleted

## 2012-12-23 ENCOUNTER — Telehealth: Payer: Self-pay | Admitting: *Deleted

## 2012-12-23 ENCOUNTER — Telehealth: Payer: Self-pay | Admitting: Family Medicine

## 2012-12-23 ENCOUNTER — Emergency Department (HOSPITAL_COMMUNITY)
Admission: EM | Admit: 2012-12-23 | Discharge: 2012-12-23 | Disposition: A | Discharge status: Home / Self Care | Payer: Medicare Other | Attending: Emergency Medicine | Admitting: Emergency Medicine

## 2012-12-23 DIAGNOSIS — I119 Hypertensive heart disease without heart failure: Secondary | ICD-10-CM | POA: Diagnosis not present

## 2012-12-23 DIAGNOSIS — R6889 Other general symptoms and signs: Secondary | ICD-10-CM | POA: Diagnosis not present

## 2012-12-23 DIAGNOSIS — F3289 Other specified depressive episodes: Secondary | ICD-10-CM | POA: Insufficient documentation

## 2012-12-23 DIAGNOSIS — Z79899 Other long term (current) drug therapy: Secondary | ICD-10-CM | POA: Diagnosis not present

## 2012-12-23 DIAGNOSIS — E8779 Other fluid overload: Secondary | ICD-10-CM | POA: Diagnosis not present

## 2012-12-23 DIAGNOSIS — R0989 Other specified symptoms and signs involving the circulatory and respiratory systems: Secondary | ICD-10-CM | POA: Insufficient documentation

## 2012-12-23 DIAGNOSIS — R5381 Other malaise: Secondary | ICD-10-CM | POA: Insufficient documentation

## 2012-12-23 DIAGNOSIS — Z8673 Personal history of transient ischemic attack (TIA), and cerebral infarction without residual deficits: Secondary | ICD-10-CM | POA: Diagnosis not present

## 2012-12-23 DIAGNOSIS — R0609 Other forms of dyspnea: Secondary | ICD-10-CM | POA: Diagnosis not present

## 2012-12-23 DIAGNOSIS — R209 Unspecified disturbances of skin sensation: Secondary | ICD-10-CM | POA: Diagnosis not present

## 2012-12-23 DIAGNOSIS — E119 Type 2 diabetes mellitus without complications: Secondary | ICD-10-CM | POA: Diagnosis not present

## 2012-12-23 DIAGNOSIS — E039 Hypothyroidism, unspecified: Secondary | ICD-10-CM | POA: Diagnosis not present

## 2012-12-23 DIAGNOSIS — Z87891 Personal history of nicotine dependence: Secondary | ICD-10-CM | POA: Insufficient documentation

## 2012-12-23 DIAGNOSIS — E785 Hyperlipidemia, unspecified: Secondary | ICD-10-CM | POA: Diagnosis not present

## 2012-12-23 DIAGNOSIS — Z853 Personal history of malignant neoplasm of breast: Secondary | ICD-10-CM | POA: Diagnosis not present

## 2012-12-23 DIAGNOSIS — E871 Hypo-osmolality and hyponatremia: Secondary | ICD-10-CM | POA: Insufficient documentation

## 2012-12-23 DIAGNOSIS — Z872 Personal history of diseases of the skin and subcutaneous tissue: Secondary | ICD-10-CM | POA: Insufficient documentation

## 2012-12-23 DIAGNOSIS — M79609 Pain in unspecified limb: Secondary | ICD-10-CM | POA: Insufficient documentation

## 2012-12-23 DIAGNOSIS — Z8679 Personal history of other diseases of the circulatory system: Secondary | ICD-10-CM | POA: Diagnosis not present

## 2012-12-23 DIAGNOSIS — Z8701 Personal history of pneumonia (recurrent): Secondary | ICD-10-CM | POA: Insufficient documentation

## 2012-12-23 DIAGNOSIS — M79601 Pain in right arm: Secondary | ICD-10-CM

## 2012-12-23 DIAGNOSIS — E8770 Fluid overload, unspecified: Secondary | ICD-10-CM

## 2012-12-23 DIAGNOSIS — Z8739 Personal history of other diseases of the musculoskeletal system and connective tissue: Secondary | ICD-10-CM | POA: Diagnosis not present

## 2012-12-23 DIAGNOSIS — M7989 Other specified soft tissue disorders: Secondary | ICD-10-CM | POA: Diagnosis not present

## 2012-12-23 DIAGNOSIS — R0602 Shortness of breath: Secondary | ICD-10-CM | POA: Diagnosis not present

## 2012-12-23 DIAGNOSIS — F329 Major depressive disorder, single episode, unspecified: Secondary | ICD-10-CM | POA: Diagnosis not present

## 2012-12-23 DIAGNOSIS — R51 Headache: Secondary | ICD-10-CM | POA: Diagnosis not present

## 2012-12-23 LAB — CBC WITH DIFFERENTIAL/PLATELET
Eosinophils Absolute: 0 10*3/uL (ref 0.0–0.7)
HCT: 26.7 % — ABNORMAL LOW (ref 36.0–46.0)
Hemoglobin: 9.2 g/dL — ABNORMAL LOW (ref 12.0–15.0)
Lymphs Abs: 0.7 10*3/uL (ref 0.7–4.0)
MCH: 30.9 pg (ref 26.0–34.0)
Monocytes Absolute: 0.3 10*3/uL (ref 0.1–1.0)
Monocytes Relative: 5 % (ref 3–12)
Neutrophils Relative %: 84 % — ABNORMAL HIGH (ref 43–77)
RBC: 2.98 MIL/uL — ABNORMAL LOW (ref 3.87–5.11)

## 2012-12-23 LAB — COMPREHENSIVE METABOLIC PANEL
Alkaline Phosphatase: 37 U/L — ABNORMAL LOW (ref 39–117)
BUN: 22 mg/dL (ref 6–23)
Chloride: 92 mEq/L — ABNORMAL LOW (ref 96–112)
GFR calc Af Amer: 38 mL/min — ABNORMAL LOW (ref >90)
Glucose, Bld: 240 mg/dL — ABNORMAL HIGH (ref 70–99)
Potassium: 4.9 mEq/L (ref 3.5–5.1)
Total Bilirubin: 0.7 mg/dL (ref 0.3–1.2)

## 2012-12-23 LAB — URINALYSIS, ROUTINE W REFLEX MICROSCOPIC
Bilirubin Urine: NEGATIVE
Ketones, ur: NEGATIVE mg/dL
Protein, ur: 30 mg/dL — AB
Urobilinogen, UA: 0.2 mg/dL (ref 0.0–1.0)

## 2012-12-23 LAB — URINE MICROSCOPIC-ADD ON

## 2012-12-23 LAB — TROPONIN I: Troponin I: <0.3 ng/mL (ref <0.30)

## 2012-12-23 MED ORDER — FUROSEMIDE 10 MG/ML IJ SOLN
40.0000 mg | Freq: Once | INTRAMUSCULAR | Status: AC
  Administered 2012-12-23: 40 mg via INTRAVENOUS
  Filled 2012-12-23: qty 4

## 2012-12-23 NOTE — ED Provider Notes (Signed)
5:09 PM Patient in no distress, with no new complaints. D/C home to f/u w PMD, per prior care team.  Carmin Muskrat, MD 12/23/12 [phone]

## 2012-12-23 NOTE — Telephone Encounter (Signed)
Called to report "I'm just not feeling right. Something is wrong".  Hands started to feel numb last night, but this am both hands feel numb and are tingling. She feels weak and jittery. Took a Valium this morning without help. Reports feeling slightly dizzy and that she needs help walking around the home due to feeling unsteady. She is having some shortness of breath, but no chest pain. No fever. Blood sugar today 254. Last Rituxan 12/08/12. Takes Prednisone 40 mg every am.

## 2012-12-23 NOTE — ED Notes (Signed)
Patient transported to CT 

## 2012-12-23 NOTE — Telephone Encounter (Signed)
Patient Information:  Caller Name: An[place]ia  Phone: [phone]  Patient: [name] [name], [name] [name]  Gender: Female  DOB: 11/01/1932  Age: 79 Years  PCP: Carolann Littler (Family Practice)  Office Follow Up:  Does the office need to follow up with this patient?: No  Instructions For The Office: N/A  RN Note:  History of TIA left side with residual weakness of left hand. Completed 4 infusions of Rituximab 12/15/12. Had frontal headache when awoke that resolved. Can stand up but feels generalized weakness and numbness in both hands that has worsened since 12/22/12. Speech sounds normal. Due to current symptoms and medical history, advised to call 911 now.   Symptoms  Reason For Call & Symptoms: Emergent call: Reported weakness and numbness of both hands that is worsening since yesterday 12/22/12.  BP 169/89.  FBS 225 but takes Prednisone  Reviewed Health History In EMR: Yes  Reviewed Medications In EMR: Yes  Reviewed Allergies In EMR: Yes  Reviewed Surgeries / Procedures: Yes  Date of Onset of Symptoms: 12/22/2012  Guideline(s) Used:  Neurologic Deficit  Disposition Per Guideline:   Call EMS 911 Now  Reason For Disposition Reached:   New neurologic deficit that is present NOW, sudden onset of ANY of the following:   Weakness of the face, arm, or leg on one side of the body  Numbness of the face, arm, or leg on one side of the body  Loss of speech or garbled speech  Advice Given:  N/A  Patient Will Follow Care Advice:  YES

## 2012-12-23 NOTE — ED Provider Notes (Signed)
CSN: [id number]     Arrival date & time 12/23/12  1232 History   First MD Initiated Contact with Patient 12/23/12 1250     Chief Complaint  Patient presents with  . Hypertension   (Consider location/radiation/quality/duration/timing/severity/associated sxs/prior Treatment) HPI Patient states she has not felt well for the past 3 days. States that she has had generalized weakness and fatigue. She also complains of mild dyspnea on exertion. She denies chest pain or cough. She's had bilateral "tingling" in her hands for 2-3 days. The symptoms are unchanged. She states she just assumed it was due to her peripheral neuropathy. She's had no speech or vision changes recently. She states she's been compliant in taking her hypertensive medication. This morning she took her blood pressure was elevated and came to the emergency department to be evaluated. Past Medical History  Diagnosis Date  . HYPOTHYROIDISM 07/24/2008  . HYPERLIPIDEMIA 07/24/2008  . DEPRESSION 05/16/2009  . HYPERTENSION 07/24/2008  . Vertigo   . Diabetes mellitus type II   . Arthritis     r tkr  . CVA (cerebral vascular accident) 2008    mini stroke.no residual  . Heart murmur     stress test 2009.dr peter Martin[place]  . Intermittent vertigo   . Skin abnormality     facial lesions .pt applying mupiracin to areas  . Atrial fibrillation   . Breast cancer   . Pneumonia   . 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 Knee surgery  2009    TKR  . Cholecystectomy  1980  . Joint replacement      r knee  . Breast surgery  2007    Lumpectomy, XRT 2006.l breast  . Video assisted thoracoscopy  04/22/2011    Procedure: VIDEO ASSISTED THORACOSCOPY;  Surgeon: Melrose Nakayama, MD;  Location: [facility];  Service: Thoracic;  Laterality: Left;  WITH BIOPSY  . Heel spur surgery Right   . Exploratory laparotomy     Family History  Problem Relation Age of Onset  . Arthritis Mother   . Rheum  arthritis Mother   . Heart disease Father   . Coronary artery disease Father   . Cancer Sister     breast CA, both sisters  . Breast cancer Sister   . Coronary artery disease Brother   . Lung cancer Brother    History  Substance Use Topics  . Smoking status: Former Smoker -- 0.50 packs/day for 12 years    Types: Cigarettes    Quit date: 06/04/1982  . Smokeless tobacco: Never Used  . Alcohol Use: No   OB History   Grav Para Term Preterm Abortions TAB SAB Ect Mult Living                 Review of Systems  Constitutional: Negative for fever and chills.  HENT: Negative for trouble swallowing, neck pain and neck stiffness.   Eyes: Negative for visual disturbance.  Respiratory: Positive for shortness of breath. Negative for cough and chest tightness.   Cardiovascular: Positive for leg swelling. Negative for chest pain and palpitations.  Gastrointestinal: Negative for nausea, vomiting, abdominal pain, diarrhea and constipation.  Genitourinary: Negative for dysuria, frequency and flank pain.  Musculoskeletal: Negative for myalgias, back pain, joint swelling, arthralgias and gait problem.  Skin: Negative for rash and wound.  Neurological: Positive for numbness. Negative for dizziness, syncope, weakness, light-headedness and headaches.  All other systems reviewed and are negative.  Allergies  Codeine sulfate; Sulfonamide derivatives; and Atarax  Home Medications   Current Outpatient Rx  Name  Route  Sig  Dispense  Refill  . amLODipine-olmesartan (AZOR) 5-40 MG per tablet   Oral   Take 1 tablet by mouth at bedtime.         Marland Kitchen apixaban (ELIQUIS) 5 MG TABS tablet   Oral   Take 1 tablet (5 mg total) by mouth 2 (two) times daily.   60 tablet   6   . CALCIUM CITRATE PO   Oral   Take 1,200 mg by mouth daily.           . cholecalciferol (VITAMIN D) 1000 UNITS tablet   Oral   Take 3,000 Units by mouth daily.           . diazepam (VALIUM) 5 MG tablet   Oral   Take 5  mg by mouth daily as needed for anxiety.         . gabapentin (NEURONTIN) 100 MG capsule   Oral   Take 1 capsule (100 mg total) by mouth 2 (two) times daily before a meal.   60 capsule   6   . glipiZIDE (GLUCOTROL) 10 MG tablet   Oral   Take 10 mg by mouth 2 (two) times daily before a meal.         . l-methylfolate-B6-B12 (METANX) 3-35-2 MG TABS   Oral   Take 1 tablet by mouth daily.         Marland Kitchen levothyroxine (SYNTHROID, LEVOTHROID) 125 MCG tablet   Oral   Take 125 mcg by mouth daily before breakfast.         . metFORMIN (GLUCOPHAGE-XR) 500 MG 24 hr tablet   Oral   Take 500 mg by mouth 2 (two) times daily.         . mometasone-formoterol (DULERA) 100-5 MCG/ACT AERO   Inhalation   Inhale 2 puffs into the lungs 2 (two) times daily.   1 Inhaler   6   . nebivolol (BYSTOLIC) 10 MG tablet   Oral   Take 10 mg by mouth daily.         Marland Kitchen Nystatin POWD      Apply powder twice a day x 7 days then as needed to affected area   1 Bottle   0   . predniSONE (DELTASONE) 20 MG tablet   Oral   Take 40 mg by mouth daily with breakfast.         . Pyridoxine HCl (VITAMIN B-6 PO)   Oral   Take 100 mg by mouth daily.          . rosuvastatin (CRESTOR) 5 MG tablet   Oral   Take 1 tablet (5 mg total) by mouth daily.   30 tablet   11   . sulfamethoxazole-trimethoprim (BACTRIM DS) 800-160 MG per tablet   Oral   Take 1 tablet by mouth every Monday, Wednesday, and Friday.          BP 173/87  Pulse 78  Temp(Src) 98 F (36.7 C) (Oral)  Resp 18  SpO2 93% Physical Exam  Nursing note and vitals reviewed. Constitutional: She is oriented to person, place, and time. She appears well-developed and well-nourished. No distress.  HENT:  Head: Normocephalic and atraumatic.  Mouth/Throat: Oropharynx is clear and moist. No oropharyngeal exudate.  Eyes: EOM are normal. Pupils are equal, round, and reactive to light.  Neck: Normal range of motion. Neck supple.  No meningismus   Cardiovascular: Normal rate.   irreg irreg  Pulmonary/Chest: Effort normal. No respiratory distress. She has no wheezes. She has rales (scattered crackles bilateral bases.).  Abdominal: Soft. Bowel sounds are normal. She exhibits no distension and no mass. There is no tenderness. There is no rebound and no guarding.  Musculoskeletal: Normal range of motion. She exhibits no edema and no tenderness.  2+ pitting edema right lower sugary greater than left.  Neurological: She is alert and oriented to person, place, and time.  Patient is alert and oriented x3 with clear, goal oriented speech. Patient has 5/5 motor in all extremities. Sensation is intact to light touch. Patient does state that she has tingling in bilateral hands in glove distribution. Bilateral finger-to-nose is normal with no signs of dysmetria.   Skin: Skin is warm and dry. No rash noted. No erythema.  Psychiatric: She has a normal mood and affect. Her behavior is normal.    ED Course  Procedures (including critical care time) Labs Review Labs Reviewed  CBC WITH DIFFERENTIAL  COMPREHENSIVE METABOLIC PANEL  TROPONIN I  PRO B NATRIURETIC PEPTIDE  URINALYSIS, ROUTINE W REFLEX MICROSCOPIC   Imaging Review No results found.  Date: 12/23/2012  Rate: 94  Rhythm: atrial fibrillation  QRS Axis: normal  Intervals: normal  ST/T Wave abnormalities: normal  Conduction Disutrbances:none  Narrative Interpretation:   Old EKG Reviewed: unchanged   MDM    Discussed with Dr. Nicole Kindred. States that the patient's symptoms do not sound like it is due to CVA/TIA. Thinks his symptoms may be due to carpal tunnel syndrome. Advises no further workup in the emergency department. Patient to followup with neurology for reevaluation.  Julianne Rice, MD 12/24/12 1409

## 2012-12-23 NOTE — Telephone Encounter (Signed)
ED Notification

## 2012-12-23 NOTE — ED Notes (Signed)
Pt states yesterday evening both her hands became more numb than usual, states at times her hands go numb and she has numbness in her L arm but was told by her PCP that was normal d/t her diabetes and TIA she had, states also this morning she felt more short of breath when walking, denies pain, states has been taking medications as prescribed, pt a/o x 4.

## 2012-12-23 NOTE — Telephone Encounter (Signed)
Per Gennaro Africa : Symptoms do not sound cancer/oncology related. Needs to contact her PCP to be seen. Patient notified and she agrees to do so.

## 2012-12-23 NOTE — ED Notes (Signed)
Per EMS pt has been having numbness/tingling in both hands since yesterday, has hx of htn, has been taking medications, has also been short of breath w/ exertion, denies pain, denies chest pain, no slurred speech, stroke scale negative, CBG 223, BP 180/90 on monitor, manual 230/110, HR 90's afib, 98% RA, RR 16, pt from home.

## 2012-12-24 ENCOUNTER — Telehealth: Payer: Self-pay | Admitting: Family Medicine

## 2012-12-24 NOTE — Telephone Encounter (Signed)
Pt treated at [facility] hospital on 12/23/12 for shortness of breath and weakness.  Pt had fluid was removed, treated and sent home.  Pt has a f/u appt with Dr. Elease Hashimoto on 12/28/12 @ 1030.   Pt has a prescription/pills for Lasix 65m at home and wants to know if she should start taking it again before her follow up appt with Dr. BElease Hashimoto No shortness of breath, feeling well today.  RN advised do not take until advised by PCM.  Please follow up w/pt. Thank you.

## 2012-12-24 NOTE — Telephone Encounter (Signed)
Patient had called and left message for call back concerning not feeling well and question about a medications. Attempted to call patient back x3, left message for patient to call us back.

## 2012-12-24 NOTE — Telephone Encounter (Signed)
Only if she was instructed by hospital to start this.

## 2012-12-27 NOTE — Telephone Encounter (Signed)
Pt informed

## 2012-12-28 ENCOUNTER — Ambulatory Visit (INDEPENDENT_AMBULATORY_CARE_PROVIDER_SITE_OTHER): Payer: Medicare Other | Admitting: Family Medicine

## 2012-12-28 ENCOUNTER — Encounter: Payer: Self-pay | Admitting: Family Medicine

## 2012-12-28 VITALS — BP 132/70 | HR 74 | Temp 98.6°F | Wt 199.0 lb

## 2012-12-28 DIAGNOSIS — Z23 Encounter for immunization: Secondary | ICD-10-CM | POA: Diagnosis not present

## 2012-12-28 DIAGNOSIS — E669 Obesity, unspecified: Secondary | ICD-10-CM | POA: Insufficient documentation

## 2012-12-28 NOTE — Patient Instructions (Addendum)
Continue to monitor blood sugars closely. Be in touch if you're consistently seeing fasting blood sugars over 200. Your blood sugar should improve gradually as you're prednisone dosage is gradually reduced.

## 2012-12-28 NOTE — Progress Notes (Signed)
Subjective:    Patient ID: [name] [name], female    DOB: 1932-06-25, 79 y.o.   MRN: [id number]  HPI Complex past medical history. Her chronic problems include history of obesity, type 2 diabetes, hypertension, atrial fibrillation, dyslipidemia, history of breast cancer, history of recurrent depression, osteoarthritis  She is seen for recent ER followup. She presented there on 9/18 with nonspecific weakness and fatigue and mild dyspnea with exertion. No acute abnormalities noted and she was not admitted. She did have elevated blood pressure on ER admission initially and this improved as she was monitored. EKG and chest x-ray revealed no acute findings.  Patient's recent history is that she had presented back around December 2012 with concern for polymyalgia rheumatica and sedimentation rate 120 with positive cANCA.  Subsequent workup including a lung biopsy in January 2013 revealed organizing pneumonia and question of low-grade endocrine malignancy with subsequent 5-hydroxyindoleacetic acid 24-hour urine test normal. Our concern was for possible Wegener's granulomatosis (patient had history of recurrent sinusitis, arthralgias/myalgias, pulmonary symptoms). Subsequent CT scan June 2013 revealed clearing of her abnormal lung from previous scans.  She then presented in July 2014 with 2-3 week history of cough with mild hemoptysis and nausea. She was admitted and CT chest rule out pulmonary embolus but showed several bilateral groundglass opacities with alveolar hemorrhage pattern. Initially her anticoagulant for atrial fibrillation was discontinued. It was felt she likely had autoimmune disorder with prior autoimmune antibody positive result. Patient also had some acute renal failure which was probably related to diuretics as renal function improved after diuresis was stopped. She was given methylprednisolone followed by Rituxan on 7/30.  She remains on prednisone 40 mg daily. She remains on Bactrim DS  for PCP prophylaxis Monday, Wednesday, Friday.  She has some generalized malaise and lethargy. She thinks some of this may be related to prednisone. Her blood sugars have actually been somewhat elevated. Most of her fasting blood sugars ranging 150-190. She's not having any polydipsia or polyuria. Most recent A1c in July was 6.9%. She remains on oral medications with metformin and sulfonylurea.  No flu vaccine yet. Denies any significant cough fever or hemoptysis at this time.  Past Medical History  Diagnosis Date  . HYPOTHYROIDISM 07/24/2008  . HYPERLIPIDEMIA 07/24/2008  . DEPRESSION 05/16/2009  . HYPERTENSION 07/24/2008  . Vertigo   . Diabetes mellitus type II   . Arthritis     r tkr  . CVA (cerebral vascular accident) 2008    mini stroke.no residual  . Heart murmur     stress test 2009.dr peter Martinique  . Intermittent vertigo   . Skin abnormality     facial lesions .pt applying mupiracin to areas  . Atrial fibrillation   . Breast cancer   . Pneumonia   . 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 Knee surgery  2009    TKR  . Cholecystectomy  1980  . Joint replacement      r knee  . Breast surgery  2007    Lumpectomy, XRT 2006.l breast  . Video assisted thoracoscopy  04/22/2011    Procedure: VIDEO ASSISTED THORACOSCOPY;  Surgeon: Melrose Nakayama, MD;  Location: [facility];  Service: Thoracic;  Laterality: Left;  WITH BIOPSY  . Heel spur surgery Right   . Exploratory laparotomy      reports that she quit smoking about 30 years ago. Her smoking use included Cigarettes. She has a 6 pack-year smoking history. She has  never used smokeless tobacco. She reports that she does not drink alcohol or use illicit drugs. family history includes Arthritis in her mother; Breast cancer in her sister; Cancer in her sister; Coronary artery disease in her brother and father; Heart disease in her father; Lung cancer in her brother; Rheum arthritis in  her mother. Allergies  Allergen Reactions  . Codeine Sulfate     REACTION: GI upset  . Sulfonamide Derivatives     REACTION: GI upset  . Atarax [Hydroxyzine] Nausea Only and Rash      Review of Systems  Constitutional: Positive for fatigue. Negative for chills and unexpected weight change.  Eyes: Negative for visual disturbance.  Respiratory: Negative for cough, chest tightness and wheezing.   Cardiovascular: Negative for chest pain, palpitations and leg swelling.  Gastrointestinal: Negative for nausea, vomiting and abdominal pain.  Endocrine: Negative for polydipsia.  Genitourinary: Negative for dysuria.  Neurological: Negative for dizziness, seizures, syncope, weakness, light-headedness and headaches.       Objective:   Physical Exam  Constitutional: She appears well-developed and well-nourished.  HENT:  Mouth/Throat: Oropharynx is clear and moist.  Neck: Neck supple.  Cardiovascular: Normal rate.   Irregular rhythm  Pulmonary/Chest: Effort normal and breath sounds normal. No respiratory distress. She has no wheezes. She has no rales.  Musculoskeletal: She exhibits no edema.          Assessment & Plan:  #1 probable Wegener's granulomatosis. Patient followed by oncology and pulmonary. Currently prednisone taper.  Recent Rituxan therapy.  Symptomatically improved with no further hemoptysis. #2 type 2 diabetes. Not monitoring blood sugars regularly. We've ordered repeat hemoglobin A1c in one month. Monitor sugars closely and be in touch if consistently over 200 fasting. May need short-term use of insulin at that time if increasing.  Flu vaccine given. #3 fatigue. Likely multifactorial. ?related to prednisone  ?stress related.  #4 hypertension.  Stable by today's reading. #5 atrial fibrillation.  Rate controlled and remains on Eliquis with no recent bleeding complication.

## 2012-12-29 ENCOUNTER — Telehealth: Payer: Self-pay

## 2012-12-29 NOTE — Telephone Encounter (Signed)
Patient stated she was charged for a no show in July, and on that day she was in the hospital. Per Dr. Elease Hashimoto he wants to know if this can be changed cause she was in the hospital.

## 2013-01-03 ENCOUNTER — Telehealth: Payer: Self-pay | Admitting: Family Medicine

## 2013-01-03 NOTE — Telephone Encounter (Signed)
Pt is calling to request samples of nebivolol (BYSTOLIC) 10 MG tablet, and amLODipine-olmesartan (AZOR) 5-40 MG per tablet. Please assist.

## 2013-01-03 NOTE — Telephone Encounter (Signed)
Patient aware that the samples will be at the front for pick up

## 2013-01-05 ENCOUNTER — Other Ambulatory Visit: Payer: Self-pay | Admitting: Family Medicine

## 2013-01-06 NOTE — Telephone Encounter (Signed)
Last refill 11/11/12 #30 no refill

## 2013-01-07 NOTE — Telephone Encounter (Signed)
Refill once.

## 2013-01-14 ENCOUNTER — Ambulatory Visit: Payer: Medicare Other | Admitting: Family Medicine

## 2013-01-17 ENCOUNTER — Encounter: Payer: Self-pay | Admitting: Internal Medicine

## 2013-01-17 ENCOUNTER — Ambulatory Visit (INDEPENDENT_AMBULATORY_CARE_PROVIDER_SITE_OTHER): Payer: Medicare Other | Admitting: Internal Medicine

## 2013-01-17 DIAGNOSIS — J84116 Cryptogenic organizing pneumonia: Secondary | ICD-10-CM

## 2013-01-17 MED ORDER — MOMETASONE FURO-FORMOTEROL FUM 100-5 MCG/ACT IN AERO: INHALATION_SPRAY | RESPIRATORY_TRACT | Status: DC

## 2013-01-17 NOTE — Progress Notes (Signed)
04/03/11- 79 year old female former smoking history referred courtesy of Dr. Elease Hashimoto with concern of abnormal chest CT. Husband is an allergy patient here. Dr. Erick Blinks recent office note reviewed "Followup from recent pneumonia. Patient had presented here with extreme myalgias and chronic pain along with bilateral shoulder hip pain. Very high sedimentation rate of 120.  She almost total resolution of myalgias with low dose prednisone strongly suggesting polymyalgia rheumatica. She subsequently presented with cough with trace of blood but no dyspnea, fever, reported a pain. Chest x-ray showed bilateral infiltrates suggesting probable bilateral pneumonia the recommendation for CT scan. CT scan revealed no adenopathy and no evidence for a lung mass. Compatible with bilateral pneumonia. Patient placed on antibiotic and much better at this time with no fever and essentially no cough. Her muscle and joint pains are almost 100% resolved with low-dose prednisone. She did present back in September with severe sinus disease suggesting acute sinusitis. CT revealed no acute findings. She denies any arthralgias at this time. No skin rash. No fever." She has been on prednisone 10 mg twice daily since December 13 and feels significantly better. Morning cough has cleared with residual trace phlegm and trace blood. No chest pain. CT chest 03/23/2011 -images reviewed by me-bilateral patchy airspace disease with air bronchograms. No cavitation or definite nodularity seen. She has had right total knee replacement but no generalized arthritis. Childhood exposure to an uncle with tuberculosis. Her PPD skin test was negative. She denies history of lung disease, GERD, DVT, kidney or liver disease. Lab- C-ANCA POS 1:320, P-ANCA NEG. RA 1:23, ANA NEG11 year old female former smoking history referred courtesy of Dr. Elease Hashimoto with concern of abnormal chest CT. Husband is an allergy patient here.  05/13/11- 52 yoF former smoker  followed for bilateral pulmonary infiltrates, incr C-ANCA, s/p VATS bx/ organizing pneumonia, Metastatic intestinal carcinoid. Marland Kitchen Hx breast Ca 2006/ Dr Truddie Coco Husband here. She returns now after left lung biopsy. She has expected left thoracotomy pain but is otherwise stable as she continues maintenance prednisone. Denies blood, fever, swollen glands, discolored sputum. Exertional dyspnea is not worse, allowing for the surgery. I shared with her and her husband the available initial pathology: Patchy organizing pneumonia with chronic interstitial pneumonia and focal atypical glands. I reviewed the discussion about her at thoracic oncology conference. The parenchymal lung process is not a vasculitis as I originally questioned. I told her there was an incidental finding of cancer cells with further identification pending but that this was a separate process. We agreed to refer her to Dr. Truddie Coco who managed her breast cancer. After she had left, personal communication from Dr. Jimmy Footman- neuroendocrine tumor staining consistent with metastatic intestinal carcinoid.  06/20/11  11 yoF former smoker followed for bilateral pulmonary infiltrates, incr C-ANCA, s/p VATS bx/ organizing pneumonia, Metastatic intestinal carcinoid. Marland Kitchen Hx breast Ca 2006/ Dr Truddie Coco   Husband here She says her breathing is now "fine" back to her normal. She denies fever, cough, sputum. I spoke with Mrs. Roehrs myself and had to explain the discovery of carcinoid after her lung biopsy. We also have the note from the [facility] indicating they discussed it with her. She has absolutely no recollection. Little cough or shortness of breath now. Still a little posterior thoracotomy incision pain. CT 05/19/11- images reviewed with them- IMPRESSION:  1. Marked improvement in bilateral air space disease seen on  comparison CT of 03/24/2011. Near complete resolution.  2. Postoperative change in the left hemithorax consistent with  recent VATS.   Original Report  Authenticated By: Suzy Bouchard, M.D.   11/07/11- 54 yoF former smoker followed for bilateral pulmonary infiltrates, incr C-ANCA, s/p VATS bx/ organizing pneumonia, Metastatic intestinal carcinoid. Marland Kitchen Hx breast Ca 2006/ Dr Truddie Coco    c/o  Chronic vertigo, difficulty breathing and gets tired easy. Also some wheezing, post nasal,  and cough.  Some days she feels much better than others, no real pattern. Occasional light cough to clear throat. We had called in prednisone, which did help. Ambulatory around home, but gets out little. CT chest 09/09/11- I reviewed w/ her- there is significant patchy bilateral lower zone fibrotic scarring and some bronchial thickening. IMPRESSION:  1. No evidence of thoracic metastatic disease or other active  process.  2. Small mild cardiomegaly and hiatal hernia.  Original Report Authenticated By: Marlaine Hind, M.D.    02/09/12-  77 yoF former smoker followed for bilateral pulmonary infiltrates, incr C-ANCA, s/p VATS bx/ cryptogenic organizing pneumonia/ BOOP, Metastatic intestinal carcinoid. Marland Kitchen Hx breast Ca 2006/ Dr Truddie Coco  January had biopsy to ck for other lung condition. SOB and wheezing at times. Left sided pain around the  Back. Continues Advair 250. Due for followup chest CT in December for Dr. Jacquiline Doe. Not on prednisone. She says breathing is unchanged. Denies pain, blood or swollen nodes.   05/11/12-  41 yoF former smoker followed for bilateral pulmonary infiltrates, incr C-ANCA, s/p VATS bx/ cryptogenic organizing pneumonia/ BOOP, Metastatic intestinal carcinoid. Complicated by Hx breast Ca 2006/ Dr Truddie Coco. AFib/ Dr Martinique, DM. She says her husband is developing dementia. Deep breath makes her left flank feel tight where she had thoracentesis. Complains of itching since starting Eliquis.Atarax 50 mg was too strong and Benadryl did not help. Itching keeps her awake all night. No visible rash. She is convinced really unpleasant itching began when  she started Eliquis. I explained this medication needs to be transitioned carefully to anything else and Dr. Martinique would need to be the one to manage that. CT chest 03/26/12 . IMPRESSION:  1. No findings to strongly suggest metastatic disease to the  thorax.  2. There is a new focus of nodular ground-glass attenuation  measuring 1 cm in the anterior aspect of the left upper lobe which  is highly nonspecific. Initial follow-up by chest CT without  contrast is recommended in 3 months to confirm persistence. This  recommendation follows the consensus statement: Recommendations for  the Management of Subsolid Pulmonary Nodules Detected at CT: A  Statement from the [employer] Society as published in Radiology  2013; 266:304-317.  3. Atherosclerosis, including left main and three-vessel coronary  artery disease. Assessment for potential risk factor  modification, dietary therapy or pharmacologic therapy may be  warranted, if clinically indicated.  4. Slight increase in the small amount of pericardial fluid  compared to the prior examination. This is unlikely to be of  hemodynamic significance at this time.  5. Moderate cardiomegaly.  6. Additional incidental findings, similar to prior studies, as  discussed above.  Original Report Authenticated By: Vinnie Langton, M.D.   08/08/12- 45 yoF former smoker followed for bilateral pulmonary infiltrates/ nodules, incr C-ANCA, s/p VATS bx/ cryptogenic organizing pneumonia/ BOOP, Metastatic intestinal carcinoid. Complicated by Hx breast Ca 2006/ Dr Truddie Coco. AFib/ Dr Martinique, DM. Itching stopped, while continuing Eliquis. She had changed detergents and fabric softener, but cause unknown. Seasonal rhinitis symptoms now. Total IgE 05/11/2012 was less than 1.5. New burning itch, intermittent, right lateral ribs x2 weeks. Has had shingles shot. No visible rash. CT chest  08/02/12 IMPRESSION:  Interval persistence but decrease in size of the ground-glass   nodules seen in the anterior left upper lobe. Adenocarcinoma  cannot be excluded. Followup by CT is recommended in 12 months,  with continued annual surveillance for a minimum of 3 years.  These recommendations are taken from "Recommendations for the  Management of Subsolid Pulmonary Nodules Detected at CT: A  Statement from the [employer]" Radiology 2013; 266:1, 304-  317.  Original Report Authenticated By: Misty Stanley, M.D.   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 Relapse since 2013 11/02/12 PR-3 880. MPO negative. - GBM neg Likely - capillaritis. 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 Admitted with hemoptysis  hemoptysis with CT scan chest findings of alveolar hemorrhage  As of June 2014 the oncologist did not feel there was any evidence of breast cancer recurrence.CT scan of the chest ruled out pulmonary embolism but showed severe bilateral groundglass opacities consistent with alveolar hemorrhage pattern. Pulmonary consultation was called. She was initially treated by stopping eliquis, providing empiric antibiotics and supported with diuresis.  Pulmonary  felt The clinical picture was felt to be consistent with diffuse alveolar hemorrhage resulting in hemoptysis. Most likely cause of alveolar hemorrhage was felt to be autoimmune based on past history of autoimmune antibody positivity and lung biopsy showing organizing pneumonia in December 2012/January 2013. Prior C.-ANCA positive though no evidence of vasculitis in Jan 2013 lung bx which is puzzling. Alternative is relapsing BOOP but the hemoptysis would be unsual unless precipitated by Eliquis. On 11/02/12 PR-3 880. Nephrology consult  On  7/29 immunosuppression therapy was initiated. She was given Pulse methylprednisolone, for three days, and her first dose of Rituxan was given on 7/30. The following days we saw significant clinical improvement in regards to hemoptysis, pulmonary infiltrates and dyspnea, She was started on  Rituxan over cyclophosphamide due to malignancy history, and also this is technically a "relapse" or A "flare". Dose starting 11/03/12 will be Induction therapy with rituximab (375 mg/m2 per week for four weeks). W/  PCP prophylaxis w/ - Bactrim 1 DS on M, W, F  Since discharge she is feeling better with resolution of hemoptysis, . Dyspnea is less and she is starting to get some of her energy back.  No fever , orthopnea or edema.  Had labs done last week. , hbg stable at 8.9 , renal fx tr down at 1.6 (max scr 2.4) . Ov with oncology last week. Rituxan  infusion given.   12/03/12- 85 yoF former smoker followed for bilateral pulmonary infiltrates/ nodules, incr C-ANCA, s/p VATS bx/ Granulomatous Polyangiitis (Wegener's) vs cryptogenic organizing pneumonia/ BOOP, Metastatic intestinal carcinoid. Complicated by Hx breast Ca 2006/ Dr Truddie Coco. AFib/ Dr Martinique, DM. Started Rituxan 375 mg/m2x 4 weeks 11/03/12. PCP prophy Bactrim 1DS on M,W,F. FOLLOWS FOR: reports doing well since ov w/ TP.  still doing well on Dulera, but needs a refill.  still taking prednisone 9m daily. 1 dose of Rituxan still pending. Had to have transfusion last week for hemoglobin 7.7. No hemoptysis since a little bit at first recognition of her granulomatous disease. She has continued prednisone 60 mg daily since hospital discharge, and we have discussed tapering the dose. No coughing, no chest pain or fever.  01/17/13- 88 yoF former smoker followed for bilateral pulmonary infiltrates/ nodules, incr C-ANCA, s/p VATS bx/ Granulomatous Polyangiitis (Wegener's) vs cryptogenic organizing pneumonia/ BOOP, Metastatic intestinal carcinoid. Complicated by Hx breast  Ca 2006/ Dr Truddie Coco. AFib/ Dr Martinique, DM. FOLLOWS FOR:  Breathing has improved since last OV needs refill on dulera Had flu vaccine Paces herself. Feels she is getting stronger. Has not needed Dulera in 2 or 3 weeks.  ROS-see HPI Constitutional:   No-   weight loss, night sweats, fevers, chills, fatigue, lassitude. HEENT:   No-  headaches, difficulty swallowing, tooth/dental problems, sore throat,       No-  sneezing, itching, ear ache, nasal congestion, post nasal drip,  CV: +chest pain, orthopnea, PND, swelling in lower extremities, anasarca,   + chronic dizziness,   No-palpitations Resp: +   shortness of breath with exertion or at rest.           No- productive cough,  No non-productive cough,               No-   change in color of mucus.  No- wheezing.   Skin: No-   rash or lesions. no- pruritus GI:  No-   heartburn, indigestion, abdominal pain, nausea, vomiting,  GU: MS:  No-   joint pain or swelling.  Neuro-     +Vertigo- uses wheelchair Psych:  No- change in mood or affect. No depression or anxiety.  No memory loss.  OBJ General- Alert, Oriented, Affect-appropriate, Distress- none acute, overweight, wheelchair Skin- rash-none, lesions- none, excoriation- none Lymphadenopathy- none Head- atraumatic            Eyes- Gross vision intact, PERRLA, conjunctivae -pale            Ears- Hearing, canals-normal            Nose- turbinate edema, no-Septal dev, mucus, polyps, erosion, perforation             Throat- Mallampati II-III , mucosa clear , drainage- none, tonsils- atrophic Neck- flexible , trachea midline, no stridor , thyroid nl, carotid no bruit Chest - symmetrical excursion , unlabored           Heart/CV-+ irregularly irregular , 2-3/6 precordial systolic murmur , no gallop  , no rub, nl s1 s2                           - JVD- none , edema+1, stasis changes- none, varices- none           Lung- clear, no wheeze , cough- none , dullness-none, rub- none           Chest wall-  healed VATS thoracotomy incision L Abd-  Br/ Gen/ Rectal- Not done, not indicated Extrem- cyanosis- none, clubbing, none, atrophy- none, strength- nl. Wheelchair for distance/ cane at home.  Neuro- grossly intact to observation

## 2013-01-17 NOTE — Patient Instructions (Addendum)
Refill script printed for [facility] to have available  Please call as needed  Try otc Desenex for yeast

## 2013-01-21 ENCOUNTER — Ambulatory Visit (INDEPENDENT_AMBULATORY_CARE_PROVIDER_SITE_OTHER): Payer: Medicare Other | Admitting: Cardiology

## 2013-01-21 ENCOUNTER — Encounter: Payer: Self-pay | Admitting: Cardiology

## 2013-01-21 VITALS — BP 150/90 | HR 67 | Ht 65.0 in | Wt 208.0 lb

## 2013-01-21 DIAGNOSIS — M3130 Wegener's granulomatosis without renal involvement: Secondary | ICD-10-CM

## 2013-01-21 DIAGNOSIS — C7A029 Malignant carcinoid tumor of the large intestine, unspecified portion: Secondary | ICD-10-CM

## 2013-01-21 DIAGNOSIS — I4891 Unspecified atrial fibrillation: Secondary | ICD-10-CM

## 2013-01-21 DIAGNOSIS — L299 Pruritus, unspecified: Secondary | ICD-10-CM | POA: Diagnosis not present

## 2013-01-21 DIAGNOSIS — M353 Polymyalgia rheumatica: Secondary | ICD-10-CM | POA: Diagnosis not present

## 2013-01-21 NOTE — Progress Notes (Signed)
[facility]. [address] [place], [place]  [address] Phone: [phone] Fax:  [phone]  Date:  01/21/2013   ID:  [name] [name], DOB 20-Jun-1932, MRN [id number]  PCP:  [name], MD    History of Present Illness: [name] [name] is a 79 y.o. female with a hx of HTN, DM2, prior TIA, HL, breast CA, atrial fibrillation.  She was placed on Eliquis (CHADS2-VASc=6) earlier this year.  She was admitted in July for hemoptysis and worsening renal function and ultimately dx with granulomatosis with polyangiitis (Wegener's).  Chest CT demonstrated no pulmonary embolism but did demonstrate alveolar hemorrhage pattern.  She was initially diuresed with Lasix but had worsening creatinine.  Infiltrates were felt to less likely be related to edema and Lasix was stopped.  Echo 11/02/12:  EF 55-60%, trivial AI, mod MR, mod to severe LAE, mild RVE, mod RAE, severe TR, severely increased PASP, oscillating density in RA (chiari network).   Eliquis was held until her hemoptysis cleared and was then resumed. She reports she went to the emergency room in September. This is apparently for symptoms more sinusitis. The CT was unremarkable. Her chest x-ray showed some mild infiltrates. Her BNP level was elevated in the 7000 range. This is actually down from 22,000 in July. She was given a dose of IV Lasix. She denies any recurrent symptoms of cough or hemoptysis. She states her breathing is doing fairly well.  Wt Readings from Last 3 Encounters:  01/21/13 208 lb (94.348 kg)  01/17/13 204 lb (92.534 kg)  12/28/12 199 lb (90.266 kg)     Past Medical History  Diagnosis Date  . HYPOTHYROIDISM 07/24/2008  . HYPERLIPIDEMIA 07/24/2008  . DEPRESSION 05/16/2009  . HYPERTENSION 07/24/2008  . Vertigo   . Diabetes mellitus type II   . Arthritis     r tkr  . CVA (cerebral vascular accident) 2008    mini stroke.no residual  . Heart murmur     stress test 2009.dr peter Martinique  . Intermittent vertigo   . Skin  abnormality     facial lesions .pt applying mupiracin to areas  . Atrial fibrillation   . Breast cancer   . Pneumonia   . Wegener's disease, pulmonary 10/2012    Current Outpatient Prescriptions  Medication Sig Dispense Refill  . amLODipine-olmesartan (AZOR) 5-40 MG per tablet Take 1 tablet by mouth at bedtime.      Marland Kitchen apixaban (ELIQUIS) 5 MG TABS tablet Take 1 tablet (5 mg total) by mouth 2 (two) times daily.  60 tablet  6  . CALCIUM CITRATE PO Take 1,200 mg by mouth daily.        . cholecalciferol (VITAMIN D) 1000 UNITS tablet Take 3,000 Units by mouth daily.        . diazepam (VALIUM) 5 MG tablet TAKE ONE TABLET BY MOUTH ONCE DAILY AS NEEDED  30 tablet  0  . gabapentin (NEURONTIN) 100 MG capsule Take 1 capsule (100 mg total) by mouth 2 (two) times daily before a meal.  60 capsule  6  . glipiZIDE (GLUCOTROL) 10 MG tablet Take 10 mg by mouth 2 (two) times daily before a meal.      . l-methylfolate-B6-B12 (METANX) 3-35-2 MG TABS Take 1 tablet by mouth daily.      Marland Kitchen levothyroxine (SYNTHROID, LEVOTHROID) 125 MCG tablet Take 125 mcg by mouth daily before breakfast.      . metFORMIN (GLUCOPHAGE-XR) 500 MG 24 hr tablet Take 1,000 mg by mouth 2 (  two) times daily. Take 2 tablets twice daily      . mometasone-formoterol (DULERA) 100-5 MCG/ACT AERO 2 puffs then rinse mouth, twice daily  1 Inhaler  prn  . nebivolol (BYSTOLIC) 10 MG tablet Take 10 mg by mouth daily.      Marland Kitchen Nystatin POWD Apply powder twice a day x 7 days then as needed to affected area  1 Bottle  0  . predniSONE (DELTASONE) 20 MG tablet Take 40 mg by mouth daily with breakfast.      . Pyridoxine HCl (VITAMIN B-6 PO) Take 100 mg by mouth daily.       . rosuvastatin (CRESTOR) 5 MG tablet Take 1 tablet (5 mg total) by mouth daily.  30 tablet  11  . sulfamethoxazole-trimethoprim (BACTRIM DS) 800-160 MG per tablet Take 1 tablet by mouth every Monday, Wednesday, and Friday.       No current facility-administered medications for this visit.     Allergies:    Allergies  Allergen Reactions  . Codeine Sulfate     REACTION: GI upset  . Sulfonamide Derivatives     REACTION: GI upset  . Atarax [Hydroxyzine] Nausea Only and Rash    Social History:  The patient  reports that she quit smoking about 30 years ago. Her smoking use included Cigarettes. She has a 6 pack-year smoking history. She has never used smokeless tobacco. She reports that she does not drink alcohol or use illicit drugs.   ROS:  Please see the history of present illness.   No hemoptysis.   All other systems reviewed and negative.   PHYSICAL EXAM: VS:  BP 150/90  Pulse 67  Ht _0  (1.651 m)  Wt 208 lb (94.348 kg)  BMI 34.61 kg/m2 Well nourished, well developed, in no acute distress. She is seen in a wheelchair. HEENT: normal Neck: no JVD or bruits Cardiac:  normal S1, S2; irregularly irregular rhythm; no murmur Lungs:  Decreased breath sounds bilaterally. Scant wheezes on the right. Abd: soft, nontender Ext: trace bilateral ankle edema Skin: warm and dry Neuro:  CNs 2-12 intact, no focal abnormalities noted     ASSESSMENT AND PLAN:  1. Atrial Fibrillation:  Rate controlled.  No further hemoptysis.  She is on the correct dose of Eliquis for her (age > 33 and weight > 60 kg).  CHADS2-VASc=6.  She would be at significant risk of stroke if she has to stop anticoagulation. 2. Pulmonary HTN:  Likely related to sequelae of Wegener's. 3. Hypertension:  BP well controlled.  Continue to monitor. 4. Hyperlipidemia:  Continue statin. 5. Granulomatosis with Polyangiitis (Wegener's):  Continue follow up with pulmonary and oncology as directed.

## 2013-01-21 NOTE — Patient Instructions (Signed)
Continue your current therapy  I will see you in 6 months.

## 2013-01-27 ENCOUNTER — Encounter: Payer: Self-pay | Admitting: Family Medicine

## 2013-01-27 ENCOUNTER — Ambulatory Visit: Payer: Medicare Other | Admitting: Family Medicine

## 2013-01-27 ENCOUNTER — Ambulatory Visit (INDEPENDENT_AMBULATORY_CARE_PROVIDER_SITE_OTHER): Payer: Medicare Other | Admitting: Family Medicine

## 2013-01-27 VITALS — BP 130/76 | HR 73 | Temp 97.9°F

## 2013-01-27 DIAGNOSIS — E119 Type 2 diabetes mellitus without complications: Secondary | ICD-10-CM | POA: Diagnosis not present

## 2013-01-27 NOTE — Patient Instructions (Signed)
Continue to monitor blood sugars and be in touch if fasting sugars consistently > 150 or 2 hour post prandial sugars are consistently > 220. Watch your sugar and starch intake closely.

## 2013-01-27 NOTE — Progress Notes (Signed)
Subjective:    Patient ID: [name] [name], female    DOB: 11-09-1932, 79 y.o.   MRN: [id number]  HPI  Reviewed for last visit:  Complex past medical history.  Her chronic problems include history of obesity, type 2 diabetes, hypertension, atrial fibrillation, dyslipidemia, history of breast cancer, history of recurrent depression, osteoarthritis  She is seen for recent ER followup. She presented there on 9/18 with nonspecific weakness and fatigue and mild dyspnea with exertion. No acute abnormalities noted and she was not admitted. She did have elevated blood pressure on ER admission initially and this improved as she was monitored. EKG and chest x-ray revealed no acute findings.  Patient's recent history is that she had presented back around December 2012 with concern for polymyalgia rheumatica and sedimentation rate 120 with positive cANCA. Subsequent workup including a lung biopsy in January 2013 revealed organizing pneumonia and question of low-grade endocrine malignancy with subsequent 5-hydroxyindoleacetic acid 24-hour urine test normal. Our concern was for possible Wegener's granulomatosis (patient had history of recurrent sinusitis, arthralgias/myalgias, pulmonary symptoms). Subsequent CT scan June 2013 revealed clearing of her abnormal lung from previous scans.  She then presented in July 2014 with 2-3 week history of cough with mild hemoptysis and nausea. She was admitted and CT chest rule out pulmonary embolus but showed several bilateral groundglass opacities with alveolar hemorrhage pattern. Initially her anticoagulant for atrial fibrillation was discontinued. It was felt she likely had autoimmune disorder with prior autoimmune antibody positive result.  Patient also had some acute renal failure which was probably related to diuretics as renal function improved after diuresis was stopped. She was given methylprednisolone followed by Rituxan on 7/30.  For today:  Followup regarding type 2  diabetes. Complex medical history as previously outlined. Recent diagnosis of Wegener's granulomatosis currently on prednisone 40 mg daily. She remains on metformin and glucotrol for diabetes and blood sugars are surprisingly doing very well (for her current dose of prednisone). Fasting blood sugar this morning 121. She has checked several postprandials and these are consistently less than 200. Denies any dry mouth or polyuria. Respiratory symptoms are stable. No recent fevers or chills  Past Medical History  Diagnosis Date  . HYPOTHYROIDISM 07/24/2008  . HYPERLIPIDEMIA 07/24/2008  . DEPRESSION 05/16/2009  . HYPERTENSION 07/24/2008  . Vertigo   . Diabetes mellitus type II   . Arthritis     r tkr  . CVA (cerebral vascular accident) 2008    mini stroke.no residual  . Heart murmur     stress test 2009.dr peter Martinique  . Intermittent vertigo   . Skin abnormality     facial lesions .pt applying mupiracin to areas  . Atrial fibrillation   . Breast cancer   . Pneumonia   . 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 Knee surgery  2009    TKR  . Cholecystectomy  1980  . Joint replacement      r knee  . Breast surgery  2007    Lumpectomy, XRT 2006.l breast  . Video assisted thoracoscopy  04/22/2011    Procedure: VIDEO ASSISTED THORACOSCOPY;  Surgeon: Melrose Nakayama, MD;  Location: [place];  Service: Thoracic;  Laterality: Left;  WITH BIOPSY  . Heel spur surgery Right   . Exploratory laparotomy      reports that she quit smoking about 30 years ago. Her smoking use included Cigarettes. She has a 6 pack-year smoking history. She has never used smokeless  tobacco. She reports that she does not drink alcohol or use illicit drugs. family history includes Arthritis in her mother; Breast cancer in her sister; Cancer in her sister; Coronary artery disease in her brother and father; Heart disease in her father; Lung cancer in her brother; Rheum  arthritis in her mother. Allergies  Allergen Reactions  . Codeine Sulfate     REACTION: GI upset  . Sulfonamide Derivatives     REACTION: GI upset  . Atarax [Hydroxyzine] Nausea Only and Rash      Review of Systems  Constitutional: Positive for fatigue. Negative for appetite change and unexpected weight change.  Respiratory: Negative for cough.   Cardiovascular: Negative for chest pain.  Endocrine: Negative for polydipsia and polyuria.  Genitourinary: Negative for dysuria.       Objective:   Physical Exam  Constitutional: She appears well-developed and well-nourished.  Cardiovascular: Normal rate and regular rhythm.   Pulmonary/Chest: Effort normal and breath sounds normal. No respiratory distress. She has no wheezes. She has no rales.  Musculoskeletal: She exhibits no edema.          Assessment & Plan:  Type 2 diabetes. Stable by home readings and occasional prednisone. Continue current medications. Continue close daily monitoring. We elected not to get A1c today because of her increased prednisone and anticipated tapering. We will recheck A1c at followup in 2 months. She'll be in touch if her fasting blood sugars are consistently over 150 or postprandials consistently over 220

## 2013-01-30 ENCOUNTER — Encounter: Payer: Self-pay | Admitting: Internal Medicine

## 2013-01-30 NOTE — Assessment & Plan Note (Signed)
Improving exercise tolerance which may be limited more by her cardiac status and lack of exercise.

## 2013-02-08 ENCOUNTER — Ambulatory Visit (HOSPITAL_BASED_OUTPATIENT_CLINIC_OR_DEPARTMENT_OTHER): Payer: Medicare Other | Admitting: Oncology

## 2013-02-08 ENCOUNTER — Telehealth: Payer: Self-pay | Admitting: Oncology

## 2013-02-08 ENCOUNTER — Other Ambulatory Visit (HOSPITAL_BASED_OUTPATIENT_CLINIC_OR_DEPARTMENT_OTHER): Payer: Medicare Other | Admitting: Lab

## 2013-02-08 ENCOUNTER — Encounter: Payer: Self-pay | Admitting: *Deleted

## 2013-02-08 VITALS — BP 134/72 | HR 80 | Temp 98.7°F | Resp 20 | Ht 65.0 in | Wt 206.7 lb

## 2013-02-08 DIAGNOSIS — C7A090 Malignant carcinoid tumor of the bronchus and lung: Secondary | ICD-10-CM | POA: Diagnosis not present

## 2013-02-08 DIAGNOSIS — N189 Chronic kidney disease, unspecified: Secondary | ICD-10-CM | POA: Diagnosis not present

## 2013-02-08 DIAGNOSIS — D72829 Elevated white blood cell count, unspecified: Secondary | ICD-10-CM

## 2013-02-08 DIAGNOSIS — Z853 Personal history of malignant neoplasm of breast: Secondary | ICD-10-CM | POA: Diagnosis not present

## 2013-02-08 DIAGNOSIS — M3130 Wegener's granulomatosis without renal involvement: Secondary | ICD-10-CM

## 2013-02-08 DIAGNOSIS — D631 Anemia in chronic kidney disease: Secondary | ICD-10-CM | POA: Diagnosis not present

## 2013-02-08 DIAGNOSIS — R7309 Other abnormal glucose: Secondary | ICD-10-CM

## 2013-02-08 LAB — COMPREHENSIVE METABOLIC PANEL (CC13)
AST: 16 U/L (ref 5–34)
Albumin: 3.3 g/dL — ABNORMAL LOW (ref 3.5–5.0)
BUN: 29.7 mg/dL — ABNORMAL HIGH (ref 7.0–26.0)
Calcium: 9 mg/dL (ref 8.4–10.4)
Chloride: 99 mEq/L (ref 98–109)
Potassium: 5 mEq/L (ref 3.5–5.1)

## 2013-02-08 LAB — CBC WITH DIFFERENTIAL/PLATELET
Basophils Absolute: 0 10*3/uL (ref 0.0–0.1)
EOS%: 0.6 % (ref 0.0–7.0)
Eosinophils Absolute: 0 10*3/uL (ref 0.0–0.5)
HGB: 8.1 g/dL — ABNORMAL LOW (ref 11.6–15.9)
MCH: 32.1 pg (ref 25.1–34.0)
NEUT#: 6.2 10*3/uL (ref 1.5–6.5)
RDW: 15.5 % — ABNORMAL HIGH (ref 11.2–14.5)
lymph#: 1 10*3/uL (ref 0.9–3.3)

## 2013-02-08 LAB — TECHNOLOGIST REVIEW

## 2013-02-08 NOTE — Telephone Encounter (Signed)
, °

## 2013-02-08 NOTE — Progress Notes (Signed)
[facility]  [phone] [phone]  OFFICE PROGRESS NOTE   ID: LOREEN BANKSON   DOB: 1932/06/19  MR#: [id number]  MPN#:[id number]   PCP: [name], MD GYN: Juanda Chance, WHNP-C SU: Alphonsa Overall, MD RAD ONC: Rexene Edison, MD CARDIO: Peter M.  Martinique, MD PULM: Tarri Fuller D. Annamaria Boots, MD NEPHRO:  Roney Jaffe, MD   HISTORY OF PRESENT ILLNESS: From Dr. Collier Salina Rubin's new patient evaluation note dated 10/01/2005: "This is a 79 year old woman referred by Dr. Lucia Gaskins for evaluation and treatment of breast cancer.  This woman undergoes annual screening mammography.  She underwent initial mammogram on 08/06/2005.  This showed a suspicious mass in the left breast.  A diagnostic mammogram performed on 08/19/2005 showed a 5 x 4 x 4 mm spiculated mass with microcalfication of the left anterior upper outer quadrant at 1 o'clock.  No palpable mass was identified.  Ultrasound showed hypoechoic mass with some shadowing and microcalcifications.  Biopsy was performed on 08/19/2005 and this showed an invasive mammary carcinoma intermediate grade, ER/PR positive at 94% and 86% respectively, proliferative index 9%, HER-2/neu was 2+ with no overexpression by FISH.  No polysomy was seen with adjuvant in her chromosome 17 of 3.  MRI scan of both breasts was performed showing a solitary abnormality in the left breast and right breast was normal.  The patient elected to undergo lumpectomy and sentinel lymph node evaluation on 09/09/2005.  The final pathology showed a 0.9 cm grade 3 of 3 invasive ductal cancer.  Lymphovascular invasion was not seen though lymphovascular invasion was identified in the original biopsy.  The solitary lymph node, which was identified, was negative for malignancy.  She has had an unremarkable postoperative course."  Her subsequent history is as detailed below.   INTERVAL HISTORY: Selby returns today with her husband Clance Boll for followup of her breast  cancer. Of course we also gave her right toxin for her Wegener's granulomatosis. She tolerated that well, without any unusual complications. She is undergoing a prednisone taper at present.   REVIEW OF SYSTEMS: What is bothering her the most is vertigo. "If it weren't for that, you couldn't hold me back". She tells me her heart is "in fine shape", although she has an irregular heartbeat in her feet swell at times. She keeps a dry cough this time of year. She bruises easily. Her diabetes is well-controlled, she says. She does not give me symptoms of worsening shortness of breath, palpitations, or severe fatigue. A detailed review of systems today was otherwise stable  PAST MEDICAL HISTORY: Past Medical History  Diagnosis Date  . HYPOTHYROIDISM 07/24/2008  . HYPERLIPIDEMIA 07/24/2008  . DEPRESSION 05/16/2009  . HYPERTENSION 07/24/2008  . Vertigo   . Diabetes mellitus type II   . Arthritis     r tkr  . CVA (cerebral vascular accident) 2008    mini stroke.no residual  . Heart murmur     stress test 2009.dr peter Martinique  . Intermittent vertigo   . Skin abnormality     facial lesions .pt applying mupiracin to areas  . Atrial fibrillation   . Breast cancer   . Pneumonia   . Wegener's disease, pulmonary 10/2012    PAST SURGICAL HISTORY: Past Surgical History  Procedure Laterality Date  . Knee surgery  2009    TKR  . Cholecystectomy  1980  . Joint replacement      r knee  . Breast surgery  2007    Lumpectomy, XRT 2006.l breast  .  Video assisted thoracoscopy  04/22/2011    Procedure: VIDEO ASSISTED THORACOSCOPY;  Surgeon: Melrose Nakayama, MD;  Location: [place];  Service: Thoracic;  Laterality: Left;  WITH BIOPSY  . Heel spur surgery Right   . Exploratory laparotomy       FAMILY HISTORY Family History  Problem Relation Age of Onset  . Arthritis Mother   . Rheum arthritis Mother   . Heart disease Father   . Coronary artery disease Father   . Cancer Sister     breast CA, both  sisters  . Breast cancer Sister   . Coronary artery disease Brother   . Lung cancer Brother   Both parents both deceased.  She had one brother who died of lung cancer and another brother who died from pancreatic cancer.   She has had two sisters with breast cancer, one dying at age 61.  One sister was diagnosed at age 20 and it recurred 64 years later.    GYNECOLOGIC HISTORY: Gravida 3, para 3.    Menarche age 56 and menopause in her 59s.  She was on hormone replacement therapy with Premarin, Provera for 15-20 years.  SOCIAL HISTORY: Mr. and Mrs. Houchin have been married since 71.  They have 3 adult sons, 7 grandchildren, and 6 great-grandchildren.   She has retired from [employer] as a Administrator.  Her husband is also retired from [employer] in 1996. In her spare time the patient enjoys cooking/baking and reading.  They have a toy poodle named "[employer]    ADVANCED DIRECTIVES:  in place   HEALTH MAINTENANCE: History  Substance Use Topics  . Smoking status: Former Smoker -- 0.50 packs/day for 12 years    Types: Cigarettes    Quit date: 06/04/1982  . Smokeless tobacco: Never Used  . Alcohol Use: No    Colonoscopy: 05/05/2003 PAP:  Bone density: The patient's last bone density scan on 10/16/2011 showed a T score of -1.8 (osteopenia). Lipid panel:    Allergies  Allergen Reactions  . Codeine Sulfate     REACTION: GI upset  . Sulfonamide Derivatives     REACTION: GI upset  . Atarax [Hydroxyzine] Nausea Only and Rash    Current Outpatient Prescriptions  Medication Sig Dispense Refill  . amLODipine-olmesartan (AZOR) 5-40 MG per tablet Take 1 tablet by mouth at bedtime.      Marland Kitchen apixaban (ELIQUIS) 5 MG TABS tablet Take 1 tablet (5 mg total) by mouth 2 (two) times daily.  60 tablet  6  . CALCIUM CITRATE PO Take 1,200 mg by mouth daily.        . cholecalciferol (VITAMIN D) 1000 UNITS tablet Take 3,000 Units by mouth daily.        . diazepam  (VALIUM) 5 MG tablet TAKE ONE TABLET BY MOUTH ONCE DAILY AS NEEDED  30 tablet  0  . gabapentin (NEURONTIN) 100 MG capsule Take 1 capsule (100 mg total) by mouth 2 (two) times daily before a meal.  60 capsule  6  . glipiZIDE (GLUCOTROL) 10 MG tablet Take 10 mg by mouth 2 (two) times daily before a meal.      . l-methylfolate-B6-B12 (METANX) 3-35-2 MG TABS Take 1 tablet by mouth daily.      Marland Kitchen levothyroxine (SYNTHROID, LEVOTHROID) 125 MCG tablet Take 125 mcg by mouth daily before breakfast.      . metFORMIN (GLUCOPHAGE-XR) 500 MG 24 hr tablet Take 1,000 mg by mouth 2 (two) times daily. Take  2 tablets twice daily      . mometasone-formoterol (DULERA) 100-5 MCG/ACT AERO 2 puffs then rinse mouth, twice daily  1 Inhaler  prn  . nebivolol (BYSTOLIC) 10 MG tablet Take 10 mg by mouth daily.      Marland Kitchen Nystatin POWD Apply powder twice a day x 7 days then as needed to affected area  1 Bottle  0  . predniSONE (DELTASONE) 20 MG tablet Take 40 mg by mouth daily with breakfast.      . Pyridoxine HCl (VITAMIN B-6 PO) Take 100 mg by mouth daily.       . rosuvastatin (CRESTOR) 5 MG tablet Take 1 tablet (5 mg total) by mouth daily.  30 tablet  11  . sulfamethoxazole-trimethoprim (BACTRIM DS) 800-160 MG per tablet Take 1 tablet by mouth every Monday, Wednesday, and Friday.       No current facility-administered medications for this visit.    OBJECTIVE: Older white woman examined in a wheelchair Filed Vitals:   02/08/13 1018  BP: 134/72  Pulse: 80  Temp: 98.7 F (37.1 C)  Resp: 20     Body mass index is 34.4 kg/(m^2).       ECOG FS: 2 - Symptomatic, <50% confined to bed  Sclerae unicteric, pupils equal and reactive Oropharynx no thrush or other lesions No cervical or supraclavicular adenopathy Lungs no rales or rhonchi Heart regular rate and rhythm today Abd soft, obese, nontender, positive bowel sounds MSK no focal spinal tenderness, no peripheral edema Neuro: nonfocal, well oriented, positive  affect Breasts: The right breast is unremarkable. The left breast is status post lumpectomy. There is no evidence of local recurrence. The left axilla is benign.    LAB RESULTS: Lab Results  Component Value Date   WBC 7.8 02/08/2013   NEUTROABS 6.2 02/08/2013   HGB 8.1* 02/08/2013   HCT 24.0* 02/08/2013   MCV 95.2 02/08/2013   PLT 239 02/08/2013      Chemistry      Component Value Date/Time   NA 131* 02/08/2013 0945   NA 126* 12/23/2012 1334   NA 136 09/09/2011 0939   K 5.0 02/08/2013 0945   K 4.9 12/23/2012 1334   K 4.7 09/09/2011 0939   CL 92* 12/23/2012 1334   CL 98 08/02/2012 0824   CL 95* 09/09/2011 0939   CO2 21* 02/08/2013 0945   CO2 22 12/23/2012 1334   CO2 29 09/09/2011 0939   BUN 29.7* 02/08/2013 0945   BUN 22 12/23/2012 1334   BUN 17 09/09/2011 0939   CREATININE 1.8* 02/08/2013 0945   CREATININE 1.49* 12/23/2012 1334   CREATININE 1.0 09/09/2011 0939      Component Value Date/Time   CALCIUM 9.0 02/08/2013 0945   CALCIUM 9.0 12/23/2012 1334   CALCIUM 9.0 09/09/2011 0939   ALKPHOS 35* 02/08/2013 0945   ALKPHOS 37* 12/23/2012 1334   ALKPHOS 45 09/09/2011 0939   AST 16 02/08/2013 0945   AST 14 12/23/2012 1334   AST 26 09/09/2011 0939   ALT 15 02/08/2013 0945   ALT 14 12/23/2012 1334   ALT 19 09/09/2011 0939   BILITOT 0.68 02/08/2013 0945   BILITOT 0.7 12/23/2012 1334   BILITOT 0.60 09/09/2011 0939      Lab Results  Component Value Date   LABCA2 28 09/09/2011    Urinalysis    Component Value Date/Time   COLORURINE YELLOW 12/23/2012 [place] 12/23/2012 1353   LABSPEC 1.012 12/23/2012 1353   PHURINE 6.5 12/23/2012  [place] 12/23/2012 1353   HGBUR MODERATE* 12/23/2012 1353   BILIRUBINUR NEGATIVE 12/23/2012 1353   BILIRUBINUR n 12/30/2010 [place] 12/23/2012 1353   PROTEINUR 30* 12/23/2012 1353   UROBILINOGEN 0.2 12/23/2012 1353   UROBILINOGEN 0.2 12/30/2010 1700   NITRITE NEGATIVE 12/23/2012 1353   NITRITE n 12/30/2010 1700   LEUKOCYTESUR NEGATIVE  12/23/2012 1353    STUDIES: No results found.  ASSESSMENT: Mrs. Hines is a 79 y.o. [place], [place] woman:  1.  Status post left breast lumpectomy with left axillary sentinel node biopsy on 09/09/2005 for a pT1b pN0, stagfe IA invasive ductal carcinoma, grade 3,  estrogen receptor 94% positive, progesterone receptor 86% positive, Ki-67 at 9%, HER-2/neu not amplified by FISH   2.  Oncotype DX report showed a recurrent score of 19 with average rate of distant recurrence of 12% at 10 years, if her only systemic treatment was tamoxifen for 5 years  3.  Status post radiation therapy from 11/11/2005 through 12/26/2005.  4.  The patient started antiestrogen therapy with Arimidex in 01/2006.  The patient also started Fosamax at that time.  She completed antiestrogen therapy in 01/2011.  5. left upper lobe lung biopsy 04/22/2011 showed a small area of atypical glands representing a well-differentiated/low grade neuroendocrine tumor.  Followup by CT  in July of 2014 showed nonspecific findings, with no obvious progression  6.  Hospitalization from 11/01/2012 through 11/07/2012 for pneumonia and granulomatous polyangiitis (Wegener's disease).  7.  Steroid-induced hyperglycemia and leukocytosis.  8. Chronic kidney disease  9. Anemia of chronic renal failure   PLAN: Lainy will see [place] one more time, a year from now, and likely at that point we'll "graduate" from followup. I am going to obtain an anemia workup at that point, but I think the reason she has been anemic now for quite a long time is her renal insufficiency, which leads to a low erythropoietin level. The patient is currently not symptomatic from that point of view. She will have her next mammogram in March 2015. She knows to call for any problems that may develop before her next visit here. Chauncey Cruel, MD

## 2013-02-09 ENCOUNTER — Ambulatory Visit: Payer: Medicare Other | Admitting: Pulmonary Disease

## 2013-02-17 DIAGNOSIS — H43399 Other vitreous opacities, unspecified eye: Secondary | ICD-10-CM | POA: Diagnosis not present

## 2013-02-17 DIAGNOSIS — H1130 Conjunctival hemorrhage, unspecified eye: Secondary | ICD-10-CM | POA: Diagnosis not present

## 2013-02-17 DIAGNOSIS — H2510 Age-related nuclear cataract, unspecified eye: Secondary | ICD-10-CM | POA: Diagnosis not present

## 2013-03-07 ENCOUNTER — Telehealth: Payer: Self-pay | Admitting: Family Medicine

## 2013-03-07 ENCOUNTER — Ambulatory Visit (INDEPENDENT_AMBULATORY_CARE_PROVIDER_SITE_OTHER): Payer: Medicare Other | Admitting: Family Medicine

## 2013-03-07 ENCOUNTER — Encounter: Payer: Self-pay | Admitting: Family Medicine

## 2013-03-07 VITALS — BP 132/80 | HR 67 | Temp 98.0°F | Wt 211.0 lb

## 2013-03-07 DIAGNOSIS — I10 Essential (primary) hypertension: Secondary | ICD-10-CM

## 2013-03-07 DIAGNOSIS — E119 Type 2 diabetes mellitus without complications: Secondary | ICD-10-CM | POA: Diagnosis not present

## 2013-03-07 DIAGNOSIS — R0609 Other forms of dyspnea: Secondary | ICD-10-CM

## 2013-03-07 DIAGNOSIS — E039 Hypothyroidism, unspecified: Secondary | ICD-10-CM

## 2013-03-07 DIAGNOSIS — R0600 Dyspnea, unspecified: Secondary | ICD-10-CM

## 2013-03-07 DIAGNOSIS — R609 Edema, unspecified: Secondary | ICD-10-CM | POA: Diagnosis not present

## 2013-03-07 DIAGNOSIS — R0989 Other specified symptoms and signs involving the circulatory and respiratory systems: Secondary | ICD-10-CM

## 2013-03-07 MED ORDER — FUROSEMIDE 20 MG PO TABS: 20.0000 mg | ORAL_TABLET | Freq: Every day | ORAL | Status: DC

## 2013-03-07 NOTE — Progress Notes (Signed)
Subjective:    Patient ID: [name] [name], female    DOB: 1932/08/28, 79 y.o.   MRN: [id number]  HPI  Patient seen with chief complaint of progressive bilateral leg and ankle edema over the past few days. She particularly noticed this past Thursday. She has not had any orthopnea. She occasionally has mild dyspnea with activity but not at rest. Her weight has gone up from 199 pounds here couple months ago to current weight of 211 pounds. She does not have any history of left ventricular systolic dysfunction.  Patient diagnosed recently with Wegener's granulomatosis. She is currently on prednisone dosage of 40 mg daily and she thinks this is causing some fluid retention. No recent dietary changes.  Type 2 diabetes and fasting blood sugars have been relatively stable though she has had some late day blood sugars over 200. She's had some recent renal insufficiency with most recent creatinine 1.8. She is still taking metformin as well as glipizide. No recent hypoglycemic symptoms.  She has normocytic anemia likely related to her renal insufficiency. Recent hemoglobin 8.1. She denies any dizziness.  Past Medical History  Diagnosis Date  . HYPOTHYROIDISM 07/24/2008  . HYPERLIPIDEMIA 07/24/2008  . DEPRESSION 05/16/2009  . HYPERTENSION 07/24/2008  . Vertigo   . Diabetes mellitus type II   . Arthritis     r tkr  . CVA (cerebral vascular accident) 2008    mini stroke.no residual  . Heart murmur     stress test 2009.dr peter Martinique  . Intermittent vertigo   . Skin abnormality     facial lesions .pt applying mupiracin to areas  . Atrial fibrillation   . Breast cancer   . Pneumonia   . 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 Knee surgery  2009    TKR  . Cholecystectomy  1980  . Joint replacement      r knee  . Breast surgery  2007    Lumpectomy, XRT 2006.l breast  . Video assisted thoracoscopy  04/22/2011    Procedure: VIDEO ASSISTED  THORACOSCOPY;  Surgeon: Melrose Nakayama, MD;  Location: [place];  Service: Thoracic;  Laterality: Left;  WITH BIOPSY  . Heel spur surgery Right   . Exploratory laparotomy      reports that she quit smoking about 30 years ago. Her smoking use included Cigarettes. She has a 6 pack-year smoking history. She has never used smokeless tobacco. She reports that she does not drink alcohol or use illicit drugs. family history includes Arthritis in her mother; Breast cancer in her sister; Cancer in her sister; Coronary artery disease in her brother and father; Heart disease in her father; Lung cancer in her brother; Rheum arthritis in her mother. Allergies  Allergen Reactions  . Codeine Sulfate     REACTION: GI upset  . Sulfonamide Derivatives     REACTION: GI upset  . Atarax [Hydroxyzine] Nausea Only and Rash      Review of Systems  Constitutional: Positive for fatigue. Negative for fever and chills.  Respiratory: Negative for wheezing.   Cardiovascular: Positive for leg swelling. Negative for chest pain and palpitations.  Genitourinary: Negative for dysuria.  Neurological: Negative for dizziness and syncope.       Objective:   Physical Exam  Constitutional: She appears well-developed and well-nourished.  She has somewhat typical cushingoid appearance related to her steroid use  HENT:  Mouth/Throat: Oropharynx is clear and moist.  Neck: Neck supple. No  thyromegaly present.  Cardiovascular: Normal rate.   Pulmonary/Chest: Effort normal and breath sounds normal.  Musculoskeletal: She exhibits edema.  Patient has 2+ pitting edema lower legs bilaterally  Neurological: She is alert.          Assessment & Plan:  #1 peripheral edema. Likely multifactorial and certainly related to chronic steroid use. Repeat basic metabolic panel. Check BNP level. Elevate legs frequently. Furosemide 20 mg daily-for 3 days only. She's been very sensitive to diuretics the past and has some chronic  low-grade hyponatremia. #2 renal insufficiency. She is still taking metformin with creatinine 1.8. Discontinue metformin. Repeat basic metabolic panel #3 type 2 diabetes. Patient needs to discontinue metformin because of her renal issues. We've recommended she start Lantus 10 units once daily with regimen given for titration. She was initially reluctant but did agree and was given instructions per nurse about how to self administer injections. Samples were provided reassess one week and bring in home log of readings #4 anemia, normocytic. Very likely related to her chronic kidney disease #5 hypothyroidism. Recheck TSH

## 2013-03-07 NOTE — Patient Instructions (Signed)
STOP Metformin Start Lantus 10 units once daily Continue with close blood sugar monitoring. May titrate Lantus up 2 units every 3 days until fasting sugars are consistently < 130 Take Furosemide 20 mg once daily for 3 days only Elevate legs frequently.

## 2013-03-07 NOTE — Telephone Encounter (Signed)
Patient Information:  Caller Name: Jaleigha  Phone: [phone]  Patient: [name] [name], [name] [name]  Gender: Female  DOB: [birth year]  Age: 79 Years  PCP: Carolann Littler (Family Practice)  Office Follow Up:  Does the office need to follow up with this patient?: No  Instructions For The Office: N/A   Symptoms  Reason For Call & Symptoms: Pt states she is DM and having ankle and leg swelling.  Reviewed Health History In EMR: Yes  Reviewed Medications In EMR: Yes  Reviewed Allergies In EMR: Yes  Reviewed Surgeries / Procedures: Yes  Date of Onset of Symptoms: 03/04/2013  Guideline(s) Used:  Leg Swelling and Edema  Disposition Per Guideline:   See Today in Office  Reason For Disposition Reached:   Moderate swelling of both ankles (e.g., swelling extends up to the knees) AND new onset or worsening  Advice Given:  Call Back If:  You become worse.  Patient Will Follow Care Advice:  YES  Appointment Scheduled:  03/07/2013 16:30:00 Appointment Scheduled Provider:  Carolann Littler [facility])

## 2013-03-07 NOTE — Progress Notes (Signed)
Pre visit review using our clinic review tool, if applicable. No additional management support is needed unless otherwise documented below in the visit note.

## 2013-03-08 LAB — BASIC METABOLIC PANEL
BUN: 29 mg/dL — ABNORMAL HIGH (ref 6–23)
CO2: 25 mEq/L (ref 19–32)
Calcium: 8.9 mg/dL (ref 8.4–10.5)
Creatinine, Ser: 1.7 mg/dL — ABNORMAL HIGH (ref 0.4–1.2)
Glucose, Bld: 232 mg/dL — ABNORMAL HIGH (ref 70–99)

## 2013-03-11 ENCOUNTER — Encounter: Payer: Self-pay | Admitting: *Deleted

## 2013-03-11 ENCOUNTER — Telehealth: Payer: Self-pay | Admitting: Internal Medicine

## 2013-03-11 NOTE — Telephone Encounter (Signed)
Message     Let's have her reduce prednisone to 30 mg daily x 2 weeks, 20 mg daily x 2 weeks, then 10 mg daily until she sees me.        Order- Quantay Zaremba- Please contact Mrs Lanahan to make this med change in her prednisone, then bring her in to see me in 6 weeks please.    -CDY        ----- Message -----    From: [name], MD    Sent: 03/08/2013 1:20 PM    To: Deneise Lever, MD        Clint,        Mrs Harnack was inquiring about whether she could taper her prednisone and I told her I would defer to you.                          LMTCB and speak with Nainika Newlun.

## 2013-03-11 NOTE — Telephone Encounter (Signed)
Duplicate message.  See prior note per Joellen Jersey

## 2013-03-11 NOTE — Telephone Encounter (Signed)
03/11/2013 4:21 PM Phone (Incoming) [place], [place] (Self) [phone] (H) Returning call.   I have spoken with patient-aware of changes in pred Rx and seeing CY on 04-22-13 at 2:30pm.

## 2013-03-14 ENCOUNTER — Ambulatory Visit: Payer: Medicare Other | Admitting: Family Medicine

## 2013-03-21 ENCOUNTER — Other Ambulatory Visit: Payer: Self-pay | Admitting: Family Medicine

## 2013-03-22 NOTE — Telephone Encounter (Signed)
Patient notified that BB is not in the office and this message will be given to him on 03/23/13.

## 2013-03-22 NOTE — Telephone Encounter (Signed)
Patient Information:  Caller Name: Robbie  Phone: [phone]  Patient: [name] [name], [name] [name]  Gender: Female  DOB: 1932/07/23  Age: 79 Years  PCP: Carolann Littler (Family Practice)  Office Follow Up:  Does the office need to follow up with this patient?: Yes  Instructions For The Office: Patient asking if she should take Lasix for a few days again to relieve the swelling. Patient can be reached at  [phone].   Symptoms  Reason For Call & Symptoms: Seen in office 2-3 weeks ago, has been on Prednisone and Eliquis.  Decreased dose of Prednisone then took Lasix QD for 3 days.  Swelling went down in legs temporarily.  Now swelling back up again in feet and ankles.  Is to decrtease Prednisone again on Fri 12/19 to 1/2 tablet.  Asking if she should take the Lasix again for a few days.  Reviewed Health History In EMR: Yes  Reviewed Medications In EMR: Yes  Reviewed Allergies In EMR: Yes  Reviewed Surgeries / Procedures: Yes  Date of Onset of Symptoms: 03/16/2013  Treatments Tried: sitting, resting, prop up legs  Treatments Tried Worked: Yes  Guideline(s) Used:  Leg Swelling and Edema  Disposition Per Guideline:   See Within 3 Days in Office  Reason For Disposition Reached:   Mild swelling of both ankles (i.e., pedal edema) AND new onset or worsening  Advice Given:  Varicose Veins - Treatment:  Try to rest and elevate your legs above your heart a couple times each day for 15 minutes.  Walking is good for your blood flow (Reason: helps pump the blood out of the veins).  Avoid prolonged standing in one place.  Call Back If:  Swelling becomes worse  Swelling becomes red or painful to the touch  Calf pain occurs and becomes constant  You become worse.  Patient Will Follow Care Advice:  YES

## 2013-03-23 NOTE — Telephone Encounter (Signed)
Left message for patient to return call.

## 2013-03-23 NOTE — Telephone Encounter (Signed)
Yes.  Take for 3 days.  Would also be helpful if she can check daily weights each morning and record so we can objectively monitor fluid status.

## 2013-03-23 NOTE — Telephone Encounter (Signed)
Pt informed

## 2013-03-25 ENCOUNTER — Telehealth: Payer: Self-pay | Admitting: Family Medicine

## 2013-03-25 MED ORDER — INSULIN GLARGINE 100 UNIT/ML SOLOSTAR PEN: 10.0000 [IU] | PEN_INJECTOR | Freq: Every day | SUBCUTANEOUS | Status: DC

## 2013-03-25 NOTE — Telephone Encounter (Signed)
Pt was given insulin glargine (LANTUS) 100 UNIT/ML injection pen needles to use and dr burchette told her it was enough to last 2 mos until she returns. However, the box only had 3 pens in it and now pt is out of insulin. Pt was given prednisone and that is why she must do the pens. After the prednisone, pt will return to her pills. Can we give pt 7 (enough to last her through)

## 2013-03-25 NOTE — Telephone Encounter (Signed)
RX sent to pharmacy

## 2013-03-27 ENCOUNTER — Other Ambulatory Visit: Payer: Self-pay | Admitting: Oncology

## 2013-03-28 ENCOUNTER — Telehealth: Payer: Self-pay | Admitting: Family Medicine

## 2013-03-28 NOTE — Telephone Encounter (Addendum)
Pt would like [place] to give her SAMPLES of this med. Dr Elease Hashimoto had told her he would give her enough to last until 04/22/13 when she goes back to dr young,, but when she got home, the box did not have enough in it. Pt 's insurance will not cover this med b/c it is temporary or her Insulin Glargine (LANTUS SOLOSTAR) 100 UNIT/ML SOPN  Pens   pls call pt to confirm this info.

## 2013-03-28 NOTE — Telephone Encounter (Signed)
Pt aware that needles are ready for pick up

## 2013-03-28 NOTE — Telephone Encounter (Signed)
error

## 2013-04-04 ENCOUNTER — Telehealth: Payer: Self-pay | Admitting: *Deleted

## 2013-04-04 NOTE — Telephone Encounter (Signed)
Pt called to this RN to inform MD of new " little red spots ". Per call, Dutchess states she had infusion therapy several months ago and the  MD wanted her to call if she noticed any " little red spots ".  Per phone discussion, Makenzi states the red spots are on her " arms only and about 7 of them ". " I am on Eloquis and bumped my right arm and have a red spot from it too ".  Per inquiry pt denies any red spots on abd or lower extremities.  Of note pt is still on prednisone.  At present pt is not scheduled for return visit until March.  This note will be given to MD for review.  Return call number given as [phone].

## 2013-04-06 ENCOUNTER — Telehealth: Payer: Self-pay | Admitting: Family Medicine

## 2013-04-06 NOTE — Telephone Encounter (Signed)
Elevate legs frequently. Increase Lasix to 20 mg 2 tablets daily for the next 3 days then resume one daily

## 2013-04-06 NOTE — Telephone Encounter (Signed)
Pt informed and verbally understands

## 2013-04-06 NOTE — Telephone Encounter (Signed)
Patient Information:  Caller Name: Dinita  Phone: [phone]  Patient: [name] [name], [name] [name]  Gender: Female  DOB: [birth year]  Age: 79 Years  PCP: Carolann Littler [facility])  Office Follow Up:  Does the office need to follow up with this patient?: Yes  Instructions For The Office: Patient is concerned about swelling. She has questions about using Lasix therapy. She states legs still swell.  She is asking for guidance about therapy. Please contact patient.  RN Note:  Patient is concerned about swelling. She has questions about using Lasix therapy. She states legs still swell.  She is asking for guidance about therapy. Please contact patient.  Symptoms  Reason For Call & Symptoms: Patient states that she is having bilateral leg and feet swelling. "feels tight and shiny". No drainage,  Onset x1 week. They go down with elevation but if she gets up the swelling returns.  She states she has Lasix at home and took one yesterday but the swelling returned.  She reports she is going to  take one today.  She is on prednisone therapy with taper.  What is she suppose to do?  Reviewed Health History In EMR: Yes  Reviewed Medications In EMR: Yes  Reviewed Allergies In EMR: Yes  Reviewed Surgeries / Procedures: Yes  Date of Onset of Symptoms: 03/30/2013  Treatments Tried: Lasix and elevation  Treatments Tried Worked: Yes  Guideline(s) Used:  Leg Swelling and Edema  Disposition Per Guideline:   See Today in Office  Reason For Disposition Reached:   Moderate swelling of both ankles (e.g., swelling extends up to the knees) AND new onset or worsening  Advice Given:  Call Back If:  Swelling becomes worse  Swelling becomes red or painful to the touch  You become worse.  RN Overrode Recommendation:  Follow Up With Office Later  Patient is concerned about swelling. She has questions about using Lasix therapy. She states legs still swell.  She is asking for guidance about therapy. Please contact  patient.

## 2013-04-08 ENCOUNTER — Other Ambulatory Visit: Payer: Self-pay | Admitting: Oncology

## 2013-04-08 ENCOUNTER — Telehealth: Payer: Self-pay | Admitting: *Deleted

## 2013-04-08 ENCOUNTER — Telehealth: Payer: Self-pay | Admitting: Cardiology

## 2013-04-08 ENCOUNTER — Other Ambulatory Visit: Payer: Self-pay | Admitting: Family Medicine

## 2013-04-08 NOTE — Telephone Encounter (Signed)
Pt had called earlier to say "arms and legs have big red spots and both are swollen". Dr Jana Hakim says this is not related to cancer and would like her to see PCP for evaluation or go to ED. Pt states she will call her PCP

## 2013-04-08 NOTE — Telephone Encounter (Signed)
Called stating she has several "red spots" on her shoulder.  States she feels like it is an emergency.  While talking to her saw that she is on Eliquis.  Advised her that the Eliquis would cause bruising.  Last seen in October by Dr. Martinique. States she is also having swelling in her legs. She insists on being seen.  Gave her an appointment to see Dr. Marlou Porch on Mon 1/5 since Dr. Doug Sou schedule is full. Tried to reassure her.  Denies hematuria, nose bleeds. Will come in on Monday.

## 2013-04-08 NOTE — Telephone Encounter (Deleted)
error

## 2013-04-08 NOTE — Telephone Encounter (Signed)
Follow up     Pt calling back regarding bruises

## 2013-04-08 NOTE — Telephone Encounter (Signed)
New message      Pt has bruised spots on arms, ankles and legs are swollen. Could this be the eliquis.

## 2013-04-11 ENCOUNTER — Encounter: Payer: Self-pay | Admitting: Cardiology

## 2013-04-11 ENCOUNTER — Ambulatory Visit (INDEPENDENT_AMBULATORY_CARE_PROVIDER_SITE_OTHER): Payer: Medicare Other | Admitting: Cardiology

## 2013-04-11 ENCOUNTER — Other Ambulatory Visit: Payer: Self-pay | Admitting: Physician Assistant

## 2013-04-11 VITALS — BP 156/78 | HR 71 | Ht 65.5 in | Wt 208.0 lb

## 2013-04-11 DIAGNOSIS — E785 Hyperlipidemia, unspecified: Secondary | ICD-10-CM | POA: Diagnosis not present

## 2013-04-11 DIAGNOSIS — I4891 Unspecified atrial fibrillation: Secondary | ICD-10-CM

## 2013-04-11 DIAGNOSIS — R600 Localized edema: Secondary | ICD-10-CM

## 2013-04-11 DIAGNOSIS — I10 Essential (primary) hypertension: Secondary | ICD-10-CM | POA: Diagnosis not present

## 2013-04-11 DIAGNOSIS — S40029A Contusion of unspecified upper arm, initial encounter: Secondary | ICD-10-CM

## 2013-04-11 DIAGNOSIS — C7A029 Malignant carcinoid tumor of the large intestine, unspecified portion: Secondary | ICD-10-CM

## 2013-04-11 DIAGNOSIS — M3130 Wegener's granulomatosis without renal involvement: Secondary | ICD-10-CM

## 2013-04-11 DIAGNOSIS — T148XXA Other injury of unspecified body region, initial encounter: Secondary | ICD-10-CM

## 2013-04-11 DIAGNOSIS — D649 Anemia, unspecified: Secondary | ICD-10-CM

## 2013-04-11 DIAGNOSIS — R609 Edema, unspecified: Secondary | ICD-10-CM

## 2013-04-11 DIAGNOSIS — C50919 Malignant neoplasm of unspecified site of unspecified female breast: Secondary | ICD-10-CM

## 2013-04-11 NOTE — Patient Instructions (Signed)
Your physician recommends that you return for  follow-up appointment in April with Dr. Martinique  Your physician recommends that you continue on your current medications as directed. Please refer to the Current Medication list given to you today.

## 2013-04-11 NOTE — Progress Notes (Signed)
[facility], [address], [place]  [address] Phone: [phone] Fax:  [phone]  Date:  04/11/2013   ID:  ABY GESSEL, DOB [birth year], MRN [id number]  PCP:  [name], MD    History of Present Illness: [name] [name] is a 80 y.o. female with a hx of HTN, DM2, prior TIA, HL, breast CA, atrial fibrillation.  She was placed on Eliquis (CHADS2-VASc=6) earlier this year.  She was admitted in July for hemoptysis and worsening renal function and ultimately dx with granulomatosis with polyangiitis (Wegener's).  Chest CT demonstrated no pulmonary embolism but did demonstrate alveolar hemorrhage pattern.  She was initially diuresed with Lasix but had worsening creatinine.  Infiltrates were felt to less likely be related to edema and Lasix was stopped.  Echo 11/02/12:  EF 55-60%, trivial AI, mod MR, mod to severe LAE, mild RVE, mod RAE, severe TR, severely increased PASP, oscillating density in RA (chiari network).   Eliquis was held until her hemoptysis cleared and was then resumed. She reports she went to the emergency room in September 2014. This is apparently for symptoms more sinusitis. The CT was unremarkable. Her chest x-ray showed some mild infiltrates. Her BNP level was elevated in the 7000 range. This is actually down from 22,000 in July. She was given a dose of IV Lasix. She denies any recurrent symptoms of cough or hemoptysis. She states her breathing is doing fairly well.  04/11/13- She wanted to be seen because of bruising on arms and pedal edema. Her pedal edema has improved after she was instructed to take her Lasix 3 days twice a day. Her bruising she states is relatively new for her even though she has been on the anticoagulation for quite some time. She is getting a CBC drawn tomorrow and hematology office. She's not having any shortness of breath, no orthopnea, no chest pain. She is here with her husband.  Wt Readings from Last 3 Encounters:  04/11/13 208 lb (94.348  kg)  03/07/13 211 lb (95.709 kg)  02/08/13 206 lb 11.2 oz (93.759 kg)     Past Medical History  Diagnosis Date  . HYPOTHYROIDISM 07/24/2008  . HYPERLIPIDEMIA 07/24/2008  . DEPRESSION 05/16/2009  . HYPERTENSION 07/24/2008  . Vertigo   . Diabetes mellitus type II   . Arthritis     r tkr  . CVA (cerebral vascular accident) 2008    mini stroke.no residual  . Heart murmur     stress test 2009.dr peter Martin[place]  . Intermittent vertigo   . Skin abnormality     facial lesions .pt applying mupiracin to areas  . Atrial fibrillation   . Breast cancer   . Pneumonia   . Wegener's disease, pulmonary 10/2012    Current Outpatient Prescriptions  Medication Sig Dispense Refill  . apixaban (ELIQUIS) 5 MG TABS tablet Take 1 tablet (5 mg total) by mouth 2 (two) times daily.  60 tablet  6  . AZOR 5-40 MG per tablet TAKE ONE TABLET BY MOUTH EVERY DAY  90 tablet  3  . CALCIUM CITRATE PO Take 1,200 mg by mouth daily.        . cholecalciferol (VITAMIN D) 1000 UNITS tablet Take 3,000 Units by mouth daily.        . diazepam (VALIUM) 5 MG tablet TAKE ONE TABLET BY MOUTH ONCE DAILY AS NEEDED  30 tablet  0  . furosemide (LASIX) 20 MG tablet Take 1 tablet (20 mg total) by mouth daily.  30 tablet  3  . gabapentin (NEURONTIN) 100 MG capsule Take 1 capsule (100 mg total) by mouth 2 (two) times daily before a meal.  60 capsule  6  . Insulin Glargine (LANTUS SOLOSTAR) 100 UNIT/ML SOPN Inject 10 Units into the skin at bedtime.  6 pen  3  . levothyroxine (SYNTHROID, LEVOTHROID) 125 MCG tablet Take 125 mcg by mouth daily before breakfast.      . mometasone-formoterol (DULERA) 100-5 MCG/ACT AERO 2 puffs then rinse mouth, twice daily  1 Inhaler  prn  . nebivolol (BYSTOLIC) 10 MG tablet Take 10 mg by mouth daily.      Marland Kitchen Nystatin POWD Apply powder twice a day x 7 days then as needed to affected area  1 Bottle  0  . predniSONE (DELTASONE) 10 MG tablet Take 10 mg by mouth daily with breakfast.      . Pyridoxine HCl (VITAMIN  B-6 PO) Take 100 mg by mouth daily.       . rosuvastatin (CRESTOR) 5 MG tablet Take 1 tablet (5 mg total) by mouth daily.  30 tablet  11  . sulfamethoxazole-trimethoprim (BACTRIM DS) 800-160 MG per tablet Take 1 tablet by mouth every Monday, Wednesday, and Friday.       No current facility-administered medications for this visit.    Allergies:    Allergies  Allergen Reactions  . Codeine Sulfate     REACTION: GI upset  . Sulfonamide Derivatives     REACTION: GI upset  . Atarax [Hydroxyzine] Nausea Only and Rash    Social History:  The patient  reports that she quit smoking about 30 years ago. Her smoking use included Cigarettes. She has a 6 pack-year smoking history. She has never used smokeless tobacco. She reports that she does not drink alcohol or use illicit drugs.   ROS:  Please see the history of present illness.   No hemoptysis.   All other systems reviewed and negative.   PHYSICAL EXAM: VS:  BP 156/78  Pulse 71  Ht 5' 5.5" (1.664 m)  Wt 208 lb (94.348 kg)  BMI 34.07 kg/m2  SpO2 94% Well nourished, well developed, in no acute distress. She is seen in a wheelchair. HEENT: normal Neck: no JVD or bruits Cardiac:  irregularly irregular rhythm; soft systolic murmur left lower sternal border Lungs:  Decreased breath sounds bilaterally. Scant wheezes on the right. Abd: soft, nontender Ext: minor bilateral ankle edemaMinor ecchymosis noted on arms (R>L). Skin: warm and dry Neuro:  CNs 2-12 intact, no focal abnormalities noted     ASSESSMENT AND PLAN:  1. Atrial Fibrillation:  Rate controlled. Bruising on arms. No further hemoptysis.  She is on the correct dose of Eliquis for her (age > 8 and weight > 60 kg).  CHADS2-VASc=6.  She would be at significant risk of stroke if she has to stop anticoagulation. Hemoglobin was 8.1 with platelet count of 239,000 on 02/08/13. Will repeat tomorrow at hematology office. It is likely that anticoagulation is cause for easy bruising. I  explained this to her. This is quite common. Nonetheless, since this is a newer finding for her, I'm glad that she is getting this looked at by hematology as well. 2. Pedal edema-she took Lasix 20 mg twice a day for 3 days and is now back on 20 mg once a day. This has improved. Continue to watch fluid intake. 3. Pulmonary HTN:  Likely related to sequelae of Wegener's. 4. Hypertension:  BP well controlled.  Continue to monitor.  5. Hyperlipidemia:  Continue statin. 6. Granulomatosis with Polyangiitis (Wegener's):  Continue follow up with pulmonary and oncology as directed.

## 2013-04-12 ENCOUNTER — Other Ambulatory Visit: Payer: Medicare Other

## 2013-04-13 ENCOUNTER — Emergency Department (HOSPITAL_COMMUNITY): Payer: Medicare Other

## 2013-04-13 ENCOUNTER — Encounter (HOSPITAL_COMMUNITY): Payer: Self-pay | Admitting: Emergency Medicine

## 2013-04-13 ENCOUNTER — Ambulatory Visit: Payer: Medicare Other | Admitting: Family

## 2013-04-13 ENCOUNTER — Inpatient Hospital Stay (HOSPITAL_COMMUNITY)
Admission: EM | Admit: 2013-04-13 | Discharge: 2013-04-18 | DRG: 189 | Disposition: A | Discharge status: Home / Self Care | Payer: Medicare Other | Attending: Internal Medicine | Admitting: Internal Medicine

## 2013-04-13 DIAGNOSIS — J9601 Acute respiratory failure with hypoxia: Secondary | ICD-10-CM | POA: Diagnosis present

## 2013-04-13 DIAGNOSIS — Z853 Personal history of malignant neoplasm of breast: Secondary | ICD-10-CM | POA: Diagnosis not present

## 2013-04-13 DIAGNOSIS — E119 Type 2 diabetes mellitus without complications: Secondary | ICD-10-CM | POA: Diagnosis not present

## 2013-04-13 DIAGNOSIS — E8779 Other fluid overload: Secondary | ICD-10-CM | POA: Diagnosis present

## 2013-04-13 DIAGNOSIS — I2789 Other specified pulmonary heart diseases: Secondary | ICD-10-CM | POA: Diagnosis present

## 2013-04-13 DIAGNOSIS — I509 Heart failure, unspecified: Secondary | ICD-10-CM | POA: Diagnosis present

## 2013-04-13 DIAGNOSIS — C50919 Malignant neoplasm of unspecified site of unspecified female breast: Secondary | ICD-10-CM | POA: Diagnosis present

## 2013-04-13 DIAGNOSIS — IMO0002 Reserved for concepts with insufficient information to code with codable children: Secondary | ICD-10-CM

## 2013-04-13 DIAGNOSIS — Z8673 Personal history of transient ischemic attack (TIA), and cerebral infarction without residual deficits: Secondary | ICD-10-CM | POA: Diagnosis not present

## 2013-04-13 DIAGNOSIS — R042 Hemoptysis: Secondary | ICD-10-CM

## 2013-04-13 DIAGNOSIS — N179 Acute kidney failure, unspecified: Secondary | ICD-10-CM | POA: Diagnosis not present

## 2013-04-13 DIAGNOSIS — M300 Polyarteritis nodosa: Secondary | ICD-10-CM | POA: Diagnosis present

## 2013-04-13 DIAGNOSIS — E039 Hypothyroidism, unspecified: Secondary | ICD-10-CM | POA: Diagnosis present

## 2013-04-13 DIAGNOSIS — R609 Edema, unspecified: Secondary | ICD-10-CM | POA: Diagnosis not present

## 2013-04-13 DIAGNOSIS — J84116 Cryptogenic organizing pneumonia: Secondary | ICD-10-CM | POA: Diagnosis not present

## 2013-04-13 DIAGNOSIS — N183 Chronic kidney disease, stage 3 unspecified: Secondary | ICD-10-CM | POA: Diagnosis present

## 2013-04-13 DIAGNOSIS — J8410 Pulmonary fibrosis, unspecified: Secondary | ICD-10-CM | POA: Diagnosis not present

## 2013-04-13 DIAGNOSIS — M810 Age-related osteoporosis without current pathological fracture: Secondary | ICD-10-CM | POA: Diagnosis present

## 2013-04-13 DIAGNOSIS — R0902 Hypoxemia: Secondary | ICD-10-CM | POA: Diagnosis not present

## 2013-04-13 DIAGNOSIS — J9600 Acute respiratory failure, unspecified whether with hypoxia or hypercapnia: Secondary | ICD-10-CM | POA: Diagnosis not present

## 2013-04-13 DIAGNOSIS — K863 Pseudocyst of pancreas: Secondary | ICD-10-CM

## 2013-04-13 DIAGNOSIS — M3130 Wegener's granulomatosis without renal involvement: Secondary | ICD-10-CM | POA: Diagnosis not present

## 2013-04-13 DIAGNOSIS — J189 Pneumonia, unspecified organism: Secondary | ICD-10-CM | POA: Diagnosis present

## 2013-04-13 DIAGNOSIS — I129 Hypertensive chronic kidney disease with stage 1 through stage 4 chronic kidney disease, or unspecified chronic kidney disease: Secondary | ICD-10-CM | POA: Diagnosis present

## 2013-04-13 DIAGNOSIS — E669 Obesity, unspecified: Secondary | ICD-10-CM

## 2013-04-13 DIAGNOSIS — J209 Acute bronchitis, unspecified: Secondary | ICD-10-CM | POA: Diagnosis not present

## 2013-04-13 DIAGNOSIS — Z803 Family history of malignant neoplasm of breast: Secondary | ICD-10-CM

## 2013-04-13 DIAGNOSIS — R935 Abnormal findings on diagnostic imaging of other abdominal regions, including retroperitoneum: Secondary | ICD-10-CM | POA: Diagnosis not present

## 2013-04-13 DIAGNOSIS — Z87891 Personal history of nicotine dependence: Secondary | ICD-10-CM | POA: Diagnosis not present

## 2013-04-13 DIAGNOSIS — I4891 Unspecified atrial fibrillation: Secondary | ICD-10-CM

## 2013-04-13 DIAGNOSIS — K862 Cyst of pancreas: Secondary | ICD-10-CM | POA: Diagnosis present

## 2013-04-13 DIAGNOSIS — D649 Anemia, unspecified: Secondary | ICD-10-CM

## 2013-04-13 DIAGNOSIS — F329 Major depressive disorder, single episode, unspecified: Secondary | ICD-10-CM

## 2013-04-13 DIAGNOSIS — Z8249 Family history of ischemic heart disease and other diseases of the circulatory system: Secondary | ICD-10-CM | POA: Diagnosis not present

## 2013-04-13 DIAGNOSIS — J40 Bronchitis, not specified as acute or chronic: Secondary | ICD-10-CM | POA: Diagnosis not present

## 2013-04-13 DIAGNOSIS — Z923 Personal history of irradiation: Secondary | ICD-10-CM

## 2013-04-13 DIAGNOSIS — J8409 Other alveolar and parieto-alveolar conditions: Secondary | ICD-10-CM | POA: Diagnosis present

## 2013-04-13 DIAGNOSIS — R0989 Other specified symptoms and signs involving the circulatory and respiratory systems: Secondary | ICD-10-CM | POA: Diagnosis not present

## 2013-04-13 DIAGNOSIS — Z801 Family history of malignant neoplasm of trachea, bronchus and lung: Secondary | ICD-10-CM

## 2013-04-13 DIAGNOSIS — R0602 Shortness of breath: Secondary | ICD-10-CM | POA: Diagnosis not present

## 2013-04-13 DIAGNOSIS — I10 Essential (primary) hypertension: Secondary | ICD-10-CM

## 2013-04-13 DIAGNOSIS — M1990 Unspecified osteoarthritis, unspecified site: Secondary | ICD-10-CM

## 2013-04-13 DIAGNOSIS — R0489 Hemorrhage from other sites in respiratory passages: Secondary | ICD-10-CM

## 2013-04-13 DIAGNOSIS — I369 Nonrheumatic tricuspid valve disorder, unspecified: Secondary | ICD-10-CM | POA: Diagnosis not present

## 2013-04-13 DIAGNOSIS — E785 Hyperlipidemia, unspecified: Secondary | ICD-10-CM | POA: Diagnosis present

## 2013-04-13 DIAGNOSIS — J811 Chronic pulmonary edema: Secondary | ICD-10-CM | POA: Diagnosis not present

## 2013-04-13 DIAGNOSIS — F3289 Other specified depressive episodes: Secondary | ICD-10-CM

## 2013-04-13 LAB — PRO B NATRIURETIC PEPTIDE: Pro B Natriuretic peptide (BNP): 13238 pg/mL — ABNORMAL HIGH (ref 0–450)

## 2013-04-13 LAB — CBC WITH DIFFERENTIAL/PLATELET
Basophils Absolute: 0 10*3/uL (ref 0.0–0.1)
Basophils Relative: 0 % (ref 0–1)
Eosinophils Absolute: 0 10*3/uL (ref 0.0–0.7)
Eosinophils Relative: 1 % (ref 0–5)
HCT: 25.3 % — ABNORMAL LOW (ref 36.0–46.0)
Hemoglobin: 8.6 g/dL — ABNORMAL LOW (ref 12.0–15.0)
Lymphocytes Relative: 19 % (ref 12–46)
Lymphs Abs: 1.3 10*3/uL (ref 0.7–4.0)
MCH: 31.2 pg (ref 26.0–34.0)
MCHC: 34 g/dL (ref 30.0–36.0)
MCV: 91.7 fL (ref 78.0–100.0)
Monocytes Absolute: 1.3 10*3/uL — ABNORMAL HIGH (ref 0.1–1.0)
Monocytes Relative: 19 % — ABNORMAL HIGH (ref 3–12)
Neutro Abs: 4.3 10*3/uL (ref 1.7–7.7)
Neutrophils Relative %: 62 % (ref 43–77)
Platelets: 214 10*3/uL (ref 150–400)
RBC: 2.76 MIL/uL — ABNORMAL LOW (ref 3.87–5.11)
RDW: 14.4 % (ref 11.5–15.5)
WBC: 7 10*3/uL (ref 4.0–10.5)

## 2013-04-13 LAB — TROPONIN I: Troponin I: <0.3 ng/mL (ref <0.30)

## 2013-04-13 LAB — BASIC METABOLIC PANEL
BUN: 21 mg/dL (ref 6–23)
CO2: 27 mEq/L (ref 19–32)
Calcium: 9.1 mg/dL (ref 8.4–10.5)
Chloride: 91 mEq/L — ABNORMAL LOW (ref 96–112)
Creatinine, Ser: 1.76 mg/dL — ABNORMAL HIGH (ref 0.50–1.10)
GFR calc Af Amer: 31 mL/min — ABNORMAL LOW (ref >90)
GFR calc non Af Amer: 27 mL/min — ABNORMAL LOW (ref >90)
Glucose, Bld: 174 mg/dL — ABNORMAL HIGH (ref 70–99)
Potassium: 3.5 mEq/L — ABNORMAL LOW (ref 3.7–5.3)
Sodium: 132 mEq/L — ABNORMAL LOW (ref 137–147)

## 2013-04-13 LAB — POCT I-STAT, CHEM 8
BUN: 23 mg/dL (ref 6–23)
Calcium, Ion: 1.16 mmol/L (ref 1.13–1.30)
Chloride: 93 mEq/L — ABNORMAL LOW (ref 96–112)
Creatinine, Ser: 1.8 mg/dL — ABNORMAL HIGH (ref 0.50–1.10)
Glucose, Bld: 171 mg/dL — ABNORMAL HIGH (ref 70–99)
HCT: 26 % — ABNORMAL LOW (ref 36.0–46.0)
Hemoglobin: 8.8 g/dL — ABNORMAL LOW (ref 12.0–15.0)
Potassium: 3.4 mEq/L — ABNORMAL LOW (ref 3.7–5.3)
Sodium: 132 mEq/L — ABNORMAL LOW (ref 137–147)
TCO2: 27 mmol/L (ref 0–100)

## 2013-04-13 MED ORDER — INSULIN GLARGINE 100 UNIT/ML SOLOSTAR PEN: 10.0000 [IU] | PEN_INJECTOR | Freq: Every day | SUBCUTANEOUS | Status: DC

## 2013-04-13 MED ORDER — NEBIVOLOL HCL 10 MG PO TABS
10.0000 mg | ORAL_TABLET | Freq: Every day | ORAL | Status: DC
  Administered 2013-04-14 – 2013-04-18 (×5): 10 mg via ORAL
  Filled 2013-04-13 (×5): qty 1

## 2013-04-13 MED ORDER — ATORVASTATIN CALCIUM 10 MG PO TABS
10.0000 mg | ORAL_TABLET | Freq: Every day | ORAL | Status: DC
  Administered 2013-04-13 – 2013-04-17 (×5): 10 mg via ORAL
  Filled 2013-04-13 (×6): qty 1

## 2013-04-13 MED ORDER — ACETAMINOPHEN 650 MG RE SUPP: 650.0000 mg | Freq: Four times a day (QID) | RECTAL | Status: DC | PRN

## 2013-04-13 MED ORDER — FUROSEMIDE 10 MG/ML IJ SOLN
40.0000 mg | Freq: Once | INTRAMUSCULAR | Status: AC
  Administered 2013-04-13: 40 mg via INTRAVENOUS
  Filled 2013-04-13: qty 4

## 2013-04-13 MED ORDER — DIAZEPAM 5 MG PO TABS
5.0000 mg | ORAL_TABLET | Freq: Every day | ORAL | Status: DC | PRN
  Administered 2013-04-17 – 2013-04-18 (×2): 5 mg via ORAL
  Filled 2013-04-13 (×2): qty 1

## 2013-04-13 MED ORDER — VITAMIN B-6 50 MG PO TABS
50.0000 mg | ORAL_TABLET | Freq: Every day | ORAL | Status: DC
  Administered 2013-04-14 – 2013-04-18 (×5): 50 mg via ORAL
  Filled 2013-04-13 (×5): qty 1

## 2013-04-13 MED ORDER — CALCIUM CITRATE 950 (200 CA) MG PO TABS
200.0000 mg | ORAL_TABLET | Freq: Every day | ORAL | Status: DC
  Administered 2013-04-14 – 2013-04-18 (×5): 200 mg via ORAL
  Filled 2013-04-13 (×7): qty 1

## 2013-04-13 MED ORDER — INSULIN ASPART 100 UNIT/ML ~~LOC~~ SOLN
0.0000 [IU] | Freq: Three times a day (TID) | SUBCUTANEOUS | Status: DC
  Administered 2013-04-14: 08:00:00 5 [IU] via SUBCUTANEOUS
  Administered 2013-04-14: 17:00:00 9 [IU] via SUBCUTANEOUS
  Administered 2013-04-14: 5 [IU] via SUBCUTANEOUS
  Administered 2013-04-15 (×2): 7 [IU] via SUBCUTANEOUS
  Administered 2013-04-15: 10 [IU] via SUBCUTANEOUS
  Administered 2013-04-16: 08:00:00 3 [IU] via SUBCUTANEOUS
  Administered 2013-04-16: 12:00:00 5 [IU] via SUBCUTANEOUS
  Administered 2013-04-16: 18:00:00 3 [IU] via SUBCUTANEOUS
  Administered 2013-04-17 (×2): 5 [IU] via SUBCUTANEOUS
  Administered 2013-04-17: 7 [IU] via SUBCUTANEOUS
  Administered 2013-04-18: 12:00:00 2 [IU] via SUBCUTANEOUS
  Administered 2013-04-18: 08:00:00 5 [IU] via SUBCUTANEOUS

## 2013-04-13 MED ORDER — IPRATROPIUM BROMIDE 0.02 % IN SOLN
0.5000 mg | Freq: Once | RESPIRATORY_TRACT | Status: AC
  Administered 2013-04-13: 0.5 mg via RESPIRATORY_TRACT
  Filled 2013-04-13: qty 2.5

## 2013-04-13 MED ORDER — LEVOFLOXACIN 750 MG PO TABS
750.0000 mg | ORAL_TABLET | ORAL | Status: DC
  Administered 2013-04-15: 17:00:00 750 mg via ORAL
  Filled 2013-04-13 (×2): qty 1

## 2013-04-13 MED ORDER — ONDANSETRON HCL 4 MG PO TABS: 4.0000 mg | ORAL_TABLET | Freq: Four times a day (QID) | ORAL | Status: DC | PRN

## 2013-04-13 MED ORDER — METHYLPREDNISOLONE SODIUM SUCC 125 MG IJ SOLR
60.0000 mg | Freq: Four times a day (QID) | INTRAMUSCULAR | Status: DC
  Administered 2013-04-13 – 2013-04-15 (×7): 60 mg via INTRAVENOUS
  Filled 2013-04-13 (×10): qty 0.96

## 2013-04-13 MED ORDER — ONDANSETRON HCL 4 MG/2ML IJ SOLN: 4.0000 mg | Freq: Four times a day (QID) | INTRAMUSCULAR | Status: DC | PRN

## 2013-04-13 MED ORDER — LEVOFLOXACIN 250 MG PO TABS
250.0000 mg | ORAL_TABLET | Freq: Once | ORAL | Status: AC
  Administered 2013-04-13: 23:00:00 250 mg via ORAL
  Filled 2013-04-13 (×2): qty 1

## 2013-04-13 MED ORDER — LEVOFLOXACIN 500 MG PO TABS
500.0000 mg | ORAL_TABLET | Freq: Once | ORAL | Status: AC
  Administered 2013-04-13: 500 mg via ORAL
  Filled 2013-04-13: qty 1

## 2013-04-13 MED ORDER — VITAMIN D3 25 MCG (1000 UNIT) PO TABS
3000.0000 [IU] | ORAL_TABLET | Freq: Every day | ORAL | Status: DC
Start: 2013-04-14 — End: 2013-04-18
  Administered 2013-04-14 – 2013-04-18 (×5): 3000 [IU] via ORAL
  Filled 2013-04-13 (×5): qty 3

## 2013-04-13 MED ORDER — ACETAMINOPHEN 325 MG PO TABS: 650.0000 mg | ORAL_TABLET | Freq: Four times a day (QID) | ORAL | Status: DC | PRN

## 2013-04-13 MED ORDER — LEVOTHYROXINE SODIUM 125 MCG PO TABS
125.0000 ug | ORAL_TABLET | Freq: Every day | ORAL | Status: DC
  Administered 2013-04-14 – 2013-04-18 (×5): 125 ug via ORAL
  Filled 2013-04-13 (×6): qty 1

## 2013-04-13 MED ORDER — FUROSEMIDE 20 MG PO TABS
20.0000 mg | ORAL_TABLET | Freq: Every day | ORAL | Status: DC
  Administered 2013-04-14: 20 mg via ORAL
  Filled 2013-04-13: qty 1

## 2013-04-13 MED ORDER — GABAPENTIN 100 MG PO CAPS
100.0000 mg | ORAL_CAPSULE | Freq: Two times a day (BID) | ORAL | Status: DC
  Administered 2013-04-14 – 2013-04-18 (×9): 100 mg via ORAL
  Filled 2013-04-13 (×11): qty 1

## 2013-04-13 MED ORDER — PREDNISONE 20 MG PO TABS
60.0000 mg | ORAL_TABLET | Freq: Once | ORAL | Status: AC
  Administered 2013-04-13: 60 mg via ORAL
  Filled 2013-04-13: qty 3

## 2013-04-13 MED ORDER — INSULIN GLARGINE 100 UNIT/ML ~~LOC~~ SOLN
10.0000 [IU] | Freq: Every day | SUBCUTANEOUS | Status: DC
  Administered 2013-04-13 – 2013-04-14 (×2): 10 [IU] via SUBCUTANEOUS
  Filled 2013-04-13 (×3): qty 0.1

## 2013-04-13 MED ORDER — ALBUTEROL SULFATE (2.5 MG/3ML) 0.083% IN NEBU
5.0000 mg | INHALATION_SOLUTION | Freq: Once | RESPIRATORY_TRACT | Status: AC
  Administered 2013-04-13: 5 mg via RESPIRATORY_TRACT
  Filled 2013-04-13: qty 6

## 2013-04-13 MED ORDER — AMLODIPINE BESYLATE 5 MG PO TABS
5.0000 mg | ORAL_TABLET | Freq: Every day | ORAL | Status: DC
  Administered 2013-04-13 – 2013-04-18 (×6): 5 mg via ORAL
  Filled 2013-04-13 (×6): qty 1

## 2013-04-13 MED ORDER — APIXABAN 5 MG PO TABS
5.0000 mg | ORAL_TABLET | Freq: Two times a day (BID) | ORAL | Status: DC
  Administered 2013-04-13 – 2013-04-18 (×10): 5 mg via ORAL
  Filled 2013-04-13 (×11): qty 1

## 2013-04-13 MED ORDER — MOMETASONE FURO-FORMOTEROL FUM 100-5 MCG/ACT IN AERO
2.0000 | INHALATION_SPRAY | Freq: Two times a day (BID) | RESPIRATORY_TRACT | Status: DC
  Administered 2013-04-14 – 2013-04-18 (×9): 2 via RESPIRATORY_TRACT
  Filled 2013-04-13: qty 8.8

## 2013-04-13 NOTE — H&P (Signed)
[facility] History and Physical  MISSOURI BRUCKNER VHQ:[id number] DOB: 12-16-1932 DOA: 04/13/2013   PCP: Kristian Covey, MD   Chief Complaint: Shortness of breath  HPI:  80 year old female with a history of Wegener's granulomatosis, chronic atrial fibrillation, BOOP,, metastatic intestinal carcinoid, diabetes mellitus, hypertension, and breast cancer in remission presents with 3 days of worsening shortness of breath. The patient complains of one-week duration of URI symptoms including increasing cough, watery eyes, runny nose, and low-grade fever for about one week. Today at home, the patient took her temperature, and it was 101.61F. The patient was recently started on Mucinex after which, the patient states that she has had increasing sputum production that is green in color. She has some nausea without any vomiting, diarrhea, abdominal pain, chest discomfort, hemoptysis. The patient has had hx of VATS bx revealing BOOP. She has been compliant with her Eliquis.   She was admitted in July 2014 for hemoptysis and worsening renal function and ultimately dx with granulomatosis with polyangiitis (Wegener's). Chest CT demonstrated no pulmonary embolism but did demonstrate alveolar hemorrhage pattern. She was initially diuresed with Lasix but had worsening creatinine. Infiltrates were felt to less likely be related to edema and Lasix was stopped. Echo 11/02/12: EF 55-60%, trivial AI, mod MR, mod to severe LAE, mild RVE, mod RAE, severe TR, severely increased PASP, oscillating density in RA (chiari network). Eliquis was held until her hemoptysis cleared and was then resumed.  She reports she went to the emergency room in September 2014. This is apparently for symptoms more sinusitis. The CT was unremarkable. Her chest x-ray showed some mild infiltrates. Her BNP level was elevated in the 7000 range. This is actually down from 22,000 in July. She was given a dose of IV Lasix.   In the ED, the patient was  given furosemide 40 mg IV, prednisone 60 mg, as well as albuterol, and levofloxacin 500mg  x 1. Assessment/Plan: acute respiratory failure  -The patient's clinical presentation is not clear-cut although given the patient's history and presentation and  wheezing I would lean more toward exacerbation of the patient's granulomatous polyangiitis or exacerbation of BOOP -Certainly, fluid overload and CHF remain in the differential diagnosis  -Obtain CT chest to clarify pulmonary findings  -May need pulmonary consultation in the morning  -Start Solu-Medrol IV -Sputum culture -Continue levofloxacin -Influenza PCR-Continue long-acting beta agonist  Elevated proBNP -Although CHF exacerbation remains in the differential diagnosis, this may be elevated due to tachycardia and her acute pulmonary process  -Repeat echocardiogram -Suspect a degree of pulmonary hypertension possibly right heart failure Continue home dose of furosemide - Hypertension -Continue amlodipine and bystolic -hold olmesartan for now as I expect serum creatinine to increase in am with pt's lasix dose in ED Hypothyroidism -Continue Synthroid Breast cancer -In remission -follows Dr. Darnelle Catalan Atrial fibrillation  -continue Eliquis -continue BB Hyperlipidemia  -Continue statin  CKD stage III -Baseline creatinine 1.5-1.8 Diabetes mellitus type 2 -Continue home dose of Lantus -NovoLog sliding scale      Past Medical History  Diagnosis Date  . HYPOTHYROIDISM 07/24/2008  . HYPERLIPIDEMIA 07/24/2008  . DEPRESSION 05/16/2009  . HYPERTENSION 07/24/2008  . Vertigo   . Diabetes mellitus type II   . Arthritis     r tkr  . CVA (cerebral vascular accident) 2008    mini stroke.no residual  . Heart murmur     stress test 2009.dr peter [place]  . Intermittent vertigo   . Skin abnormality     facial lesions .pt applying  mupiracin to areas  . Atrial fibrillation   . Breast cancer   . Pneumonia   . Wegener's disease, pulmonary  10/2012   Past Surgical History  Procedure Laterality Date  . Knee surgery  2009    TKR  . Cholecystectomy  1980  . Joint replacement      r knee  . Breast surgery  2007    Lumpectomy, XRT 2006.l breast  . Video assisted thoracoscopy  04/22/2011    Procedure: VIDEO ASSISTED THORACOSCOPY;  Surgeon: Loreli Slot, MD;  Location: [facility] OR;  Service: Thoracic;  Laterality: Left;  WITH BIOPSY  . Heel spur surgery Right   . Exploratory laparotomy     Social History:  reports that she quit smoking about 30 years ago. Her smoking use included Cigarettes. She has a 6 pack-year smoking history. She has never used smokeless tobacco. She reports that she does not drink alcohol or use illicit drugs.   Family History  Problem Relation Age of Onset  . Arthritis Mother   . Rheum arthritis Mother   . Heart disease Father   . Coronary artery disease Father   . Cancer Sister     breast CA, both sisters  . Breast cancer Sister   . Coronary artery disease Brother   . Lung cancer Brother      Allergies  Allergen Reactions  . Codeine Sulfate     REACTION: GI upset  . Sulfonamide Derivatives     REACTION: GI upset  . Atarax [Hydroxyzine] Nausea Only and Rash      Prior to Admission medications   Medication Sig Start Date End Date Taking? Authorizing Provider  amLODipine-olmesartan (AZOR) 5-40 MG per tablet Take 1 tablet by mouth daily.   Yes Historical Provider, MD  apixaban (ELIQUIS) 5 MG TABS tablet Take 1 tablet (5 mg total) by mouth 2 (two) times daily. 04/15/12  Yes Peter M [place], MD  CALCIUM CITRATE PO Take 1,200 mg by mouth daily.     Yes Historical Provider, MD  cholecalciferol (VITAMIN D) 1000 UNITS tablet Take 3,000 Units by mouth daily.     Yes Historical Provider, MD  diazepam (VALIUM) 5 MG tablet Take 5 mg by mouth daily as needed (vertigo.).   Yes Historical Provider, MD  furosemide (LASIX) 20 MG tablet Take 1 tablet (20 mg total) by mouth daily. 03/07/13  Yes Kristian Covey, MD  gabapentin (NEURONTIN) 100 MG capsule Take 1 capsule (100 mg total) by mouth 2 (two) times daily before a meal. 11/08/12  Yes Simonne Martinet, NP  Insulin Glargine (LANTUS SOLOSTAR) 100 UNIT/ML SOPN Inject 10 Units into the skin at bedtime. 03/25/13  Yes Kristian Covey, MD  levothyroxine (SYNTHROID, LEVOTHROID) 125 MCG tablet Take 125 mcg by mouth daily before breakfast.   Yes Historical Provider, MD  mometasone-formoterol (DULERA) 100-5 MCG/ACT AERO 2 puffs then rinse mouth, twice daily 01/17/13  Yes Clinton D Young, MD  nebivolol (BYSTOLIC) 10 MG tablet Take 10 mg by mouth daily.   Yes Historical Provider, MD  predniSONE (DELTASONE) 10 MG tablet Take 10 mg by mouth daily with breakfast.   Yes Historical Provider, MD  Pyridoxine HCl (VITAMIN B-6 PO) Take 100 mg by mouth daily.    Yes Historical Provider, MD  rosuvastatin (CRESTOR) 5 MG tablet Take 1 tablet (5 mg total) by mouth daily. 04/20/12  Yes Peter M [place], MD  sulfamethoxazole-trimethoprim (BACTRIM DS) 800-160 MG per tablet Take 1 tablet by mouth every Monday, Wednesday,  and Friday.   Yes Historical Provider, MD    Review of Systems:  Constitutional:  No weight loss, night sweats, Head&Eyes: No headache.  No vision loss.  No eye pain or scotoma ENT:  No Difficulty swallowing,Tooth/dental problems,Sore throat,  No ear ache, post nasal drip,  Cardio-vascular:  No chest pain, Orthopnea, PND,dizziness, palpitations  GI:  No  abdominal pain,  vomiting, diarrhea, loss of appetite, hematochezia, melena, heartburn, indigestion, Resp:  Complains of cough with green sputum and shortness of breath.  Skin:  no rash or lesions.  GU:  no dysuria, change in color of urine, no urgency or frequency. No flank pain.  Musculoskeletal:  No joint pain or swelling. No decreased range of motion. No back pain.  Psych:  No change in mood or affect. No depression or anxiety. Neurologic: No headache, no dysesthesia, no focal weakness,  no vision loss. No syncope  Physical Exam: Filed Vitals:   04/13/13 1331 04/13/13 1347 04/13/13 1347  BP: 142/55    Pulse: 108    Temp: 99.6 F (37.6 C)    TempSrc: Oral    SpO2: 100% 88% 95%   General:  A&O x 3, NAD, nontoxic, pleasant/cooperative Head/Eye: No conjunctival hemorrhage, no icterus, [name]/AT, No nystagmus ENT:  No icterus,  No thrush, good dentition, no pharyngeal exudate Neck:  No masses, no lymphadenpathy, no bruits CV:  IRRR, no rub, no gallop, no S3 Lung:  Bilateral expiratory wheeze with scattered rales. Abdomen: soft/NT, +BS, nondistended, no peritoneal signs Ext: No cyanosis, No rashes, No petechiae, No lymphangitis, 2+ LE edema   Labs on Admission:  Basic Metabolic Panel:  Recent Labs Lab 04/13/13 1645 04/13/13 1653  NA 132* 132*  K 3.5* 3.4*  CL 91* 93*  CO2 27  --   GLUCOSE 174* 171*  BUN 21 23  CREATININE 1.76* 1.80*  CALCIUM 9.1  --    Liver Function Tests: No results found for this basename: AST, ALT, ALKPHOS, BILITOT, PROT, ALBUMIN,  in the last 168 hours No results found for this basename: LIPASE, AMYLASE,  in the last 168 hours No results found for this basename: AMMONIA,  in the last 168 hours CBC:  Recent Labs Lab 04/13/13 1410 04/13/13 1653  WBC 7.0  --   NEUTROABS 4.3  --   HGB 8.6* 8.8*  HCT 25.3* 26.0*  MCV 91.7  --   PLT 214  --    Cardiac Enzymes:  Recent Labs Lab 04/13/13 1645  TROPONINI <0.30   BNP: No components found with this basename: POCBNP,  CBG: No results found for this basename: GLUCAP,  in the last 168 hours  Radiological Exams on Admission: Dg Chest 2 View  04/13/2013   CLINICAL DATA:  Productive cough and shortness of breath for 2 days.  EXAM: CHEST  2 VIEW  COMPARISON:  PA and lateral chest 12/23/2012.  FINDINGS: There is pulmonary vascular congestion and marked cardiomegaly, unchanged. No consolidative process, pneumothorax or effusion.  IMPRESSION: Cardiomegaly and vascular congestion.  No acute  process.   Electronically Signed   By: Drusilla Kanner M.D.   On: 04/13/2013 14:45    EKG: Independently reviewed. Atrial fibrillation with nonspecific ST changes    Time spent:70 minutes Code Status:   FULL Family Communication:   Husband at bedside   Mariem Skolnick, DO  [facility] Pager [phone]  If 7PM-7AM, please contact night-coverage [email or web address] Password [email or web address] 04/13/2013, 6:10 PM

## 2013-04-13 NOTE — ED Notes (Signed)
Bed: ES97 Expected date:  Expected time:  Means of arrival:  Comments: EMS-fever/SOB

## 2013-04-13 NOTE — Progress Notes (Signed)
Utilization Review completed.  Livia Snellen RN CM

## 2013-04-13 NOTE — ED Notes (Signed)
Pt O2 sat 82-85 RA while ambulating

## 2013-04-13 NOTE — ED Provider Notes (Signed)
CSN: [id number]     Arrival date & time 04/13/13  1327 History   First MD Initiated Contact with Patient 04/13/13 1333     Chief Complaint  Patient presents with  . Shortness of Breath  . Cough   (Consider location/radiation/quality/duration/timing/severity/associated sxs/prior Treatment) HPI  Is a 80 year old female with cough and shortness of breath. Gradual onset 2 days ago and progressively worsening. Associated with nausea and anorexia. Patient has herself audibly wheezing at times over the past day. Past history significant for Wegener's chronically on steroids. Cough has been productive. No hemoptysis. Additionally HTN, TIA breast CA, atrial fibrillation on eliquis. Recently increased lasix briefly because of worsened LE edema which improved with this. No sick contacts. No urinary complaints. Subjective fever. Has been taking mucinex with mild relief.   Past Medical History  Diagnosis Date  . HYPOTHYROIDISM 07/24/2008  . HYPERLIPIDEMIA 07/24/2008  . DEPRESSION 05/16/2009  . HYPERTENSION 07/24/2008  . Vertigo   . Diabetes mellitus type II   . Arthritis     r tkr  . CVA (cerebral vascular accident) 2008    mini stroke.no residual  . Heart murmur     stress test 2009.dr peter [place]  . Intermittent vertigo   . Skin abnormality     facial lesions .pt applying mupiracin to areas  . Atrial fibrillation   . Breast cancer   . Pneumonia   . 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 Knee surgery  2009    TKR  . Cholecystectomy  1980  . Joint replacement      r knee  . Breast surgery  2007    Lumpectomy, XRT 2006.l breast  . Video assisted thoracoscopy  04/22/2011    Procedure: VIDEO ASSISTED THORACOSCOPY;  Surgeon: Melrose Nakayama, MD;  Location: [facility];  Service: Thoracic;  Laterality: Left;  WITH BIOPSY  . Heel spur surgery Right   . Exploratory laparotomy     Family History  Problem Relation Age of Onset  . Arthritis  Mother   . Rheum arthritis Mother   . Heart disease Father   . Coronary artery disease Father   . Cancer Sister     breast CA, both sisters  . Breast cancer Sister   . Coronary artery disease Brother   . Lung cancer Brother    History  Substance Use Topics  . Smoking status: Former Smoker -- 0.50 packs/day for 12 years    Types: Cigarettes    Quit date: 06/04/1982  . Smokeless tobacco: Never Used  . Alcohol Use: No   OB History   Grav Para Term Preterm Abortions TAB SAB Ect Mult Living                 Review of Systems  All systems reviewed and negative, other than as noted in HPI.   Allergies  Codeine sulfate; Sulfonamide derivatives; and Atarax  Home Medications   Current Outpatient Rx  Name  Route  Sig  Dispense  Refill  . apixaban (ELIQUIS) 5 MG TABS tablet   Oral   Take 1 tablet (5 mg total) by mouth 2 (two) times daily.   60 tablet   6   . AZOR 5-40 MG per tablet      TAKE ONE TABLET BY MOUTH EVERY DAY   90 tablet   3   . CALCIUM CITRATE PO   Oral   Take 1,200 mg by mouth daily.           Marland Kitchen  cholecalciferol (VITAMIN D) 1000 UNITS tablet   Oral   Take 3,000 Units by mouth daily.           . diazepam (VALIUM) 5 MG tablet      TAKE ONE TABLET BY MOUTH ONCE DAILY AS NEEDED   30 tablet   0   . furosemide (LASIX) 20 MG tablet   Oral   Take 1 tablet (20 mg total) by mouth daily.   30 tablet   3   . gabapentin (NEURONTIN) 100 MG capsule   Oral   Take 1 capsule (100 mg total) by mouth 2 (two) times daily before a meal.   60 capsule   6   . Insulin Glargine (LANTUS SOLOSTAR) 100 UNIT/ML SOPN   Subcutaneous   Inject 10 Units into the skin at bedtime.   6 pen   3   . levothyroxine (SYNTHROID, LEVOTHROID) 125 MCG tablet   Oral   Take 125 mcg by mouth daily before breakfast.         . mometasone-formoterol (DULERA) 100-5 MCG/ACT AERO      2 puffs then rinse mouth, twice daily   1 Inhaler   prn   . nebivolol (BYSTOLIC) 10 MG  tablet   Oral   Take 10 mg by mouth daily.         Marland Kitchen Nystatin POWD      Apply powder twice a day x 7 days then as needed to affected area   1 Bottle   0   . predniSONE (DELTASONE) 10 MG tablet   Oral   Take 10 mg by mouth daily with breakfast.         . Pyridoxine HCl (VITAMIN B-6 PO)   Oral   Take 100 mg by mouth daily.          . rosuvastatin (CRESTOR) 5 MG tablet   Oral   Take 1 tablet (5 mg total) by mouth daily.   30 tablet   11   . sulfamethoxazole-trimethoprim (BACTRIM DS) 800-160 MG per tablet   Oral   Take 1 tablet by mouth every Monday, Wednesday, and Friday.          BP 142/55  Pulse 108  Temp(Src) 99.6 F (37.6 C) (Oral)  SpO2 95% Physical Exam  Nursing note and vitals reviewed. Constitutional: She appears well-developed and well-nourished. No distress.  HENT:  Head: Normocephalic and atraumatic.  Eyes: Conjunctivae are normal. Right eye exhibits no discharge. Left eye exhibits no discharge.  Neck: Neck supple.  Cardiovascular: Normal rate, regular rhythm and normal heart sounds.  Exam reveals no gallop and no friction rub.   No murmur heard. Pulmonary/Chest: Effort normal. No respiratory distress. She has wheezes.  Abdominal: Soft. She exhibits no distension. There is no tenderness.  Musculoskeletal: She exhibits no edema and no tenderness.  Symmetric pitting LE edema  Neurological: She is alert.  Skin: Skin is warm and dry.  Psychiatric: She has a normal mood and affect. Her behavior is normal. Thought content normal.    ED Course  Procedures (including critical care time) Labs Review Labs Reviewed  CBC WITH DIFFERENTIAL - Abnormal; Notable for the following:    RBC 2.76 (*)    Hemoglobin 8.6 (*)    HCT 25.3 (*)    Monocytes Relative 19 (*)    Monocytes Absolute 1.3 (*)    All other components within normal limits  POCT I-STAT, CHEM 8 - Abnormal; Notable for the following:    Sodium  132 (*)    Potassium 3.4 (*)    Chloride 93 (*)     Creatinine, Ser 1.80 (*)    Glucose, Bld 171 (*)    Hemoglobin 8.8 (*)    HCT 26.0 (*)    All other components within normal limits  BASIC METABOLIC PANEL  PRO B NATRIURETIC PEPTIDE  TROPONIN I   Imaging Review Dg Chest 2 View  04/13/2013   CLINICAL DATA:  Productive cough and shortness of breath for 2 days.  EXAM: CHEST  2 VIEW  COMPARISON:  PA and lateral chest 12/23/2012.  FINDINGS: There is pulmonary vascular congestion and marked cardiomegaly, unchanged. No consolidative process, pneumothorax or effusion.  IMPRESSION: Cardiomegaly and vascular congestion.  No acute process.   Electronically Signed   By: Inge Rise M.D.   On: 04/13/2013 14:45      MDM   1. Hypoxemia   2. Bronchitis   3. Wegener's granulomatosis (granulomatosis with polyangiitis)      80yF with cough, dyspnea and wheezing. Suspect bronchitis. Nebs and steroids given.  Subjective fever. Afebrile in ED and no leukocytosis. No focal infiltrate on XR, but with chronic underlying lung disease abx were ordered.   Patient was ambulated to the bathroom without oxygen. Oxygen saturation as low as 80% with increased symptoms. Will admit for further tx.    Virgel Manifold, MD 04/15/13 [phone]

## 2013-04-13 NOTE — ED Notes (Addendum)
Per EMS, pt has had congestion, cough, SOB on exertion since two days ago. Pt took Mucinex this morning, which did not help pt's symptoms. EMS states pt was wheezing and SOB upon arrival to pt's house. Pt also complains of nausea. Pt given 36m Albuterol/0.5 atrovent neb treatment en route. 468mZofran given by EMS for nausea.

## 2013-04-14 ENCOUNTER — Inpatient Hospital Stay (HOSPITAL_COMMUNITY): Payer: Medicare Other

## 2013-04-14 DIAGNOSIS — R0902 Hypoxemia: Secondary | ICD-10-CM | POA: Diagnosis not present

## 2013-04-14 DIAGNOSIS — M3130 Wegener's granulomatosis without renal involvement: Secondary | ICD-10-CM

## 2013-04-14 DIAGNOSIS — R609 Edema, unspecified: Secondary | ICD-10-CM

## 2013-04-14 DIAGNOSIS — I10 Essential (primary) hypertension: Secondary | ICD-10-CM

## 2013-04-14 DIAGNOSIS — J8410 Pulmonary fibrosis, unspecified: Secondary | ICD-10-CM | POA: Diagnosis not present

## 2013-04-14 DIAGNOSIS — I369 Nonrheumatic tricuspid valve disorder, unspecified: Secondary | ICD-10-CM

## 2013-04-14 DIAGNOSIS — J84116 Cryptogenic organizing pneumonia: Secondary | ICD-10-CM | POA: Diagnosis not present

## 2013-04-14 DIAGNOSIS — I4891 Unspecified atrial fibrillation: Secondary | ICD-10-CM | POA: Diagnosis not present

## 2013-04-14 LAB — INFLUENZA PANEL BY PCR (TYPE A & B)
H1N1FLUPCR: NOT DETECTED
INFLAPCR: NEGATIVE
INFLBPCR: NEGATIVE

## 2013-04-14 LAB — BASIC METABOLIC PANEL
BUN: 27 mg/dL — ABNORMAL HIGH (ref 6–23)
CO2: 27 mEq/L (ref 19–32)
Calcium: 8.6 mg/dL (ref 8.4–10.5)
Chloride: 89 mEq/L — ABNORMAL LOW (ref 96–112)
Creatinine, Ser: 1.87 mg/dL — ABNORMAL HIGH (ref 0.50–1.10)
GFR calc Af Amer: 29 mL/min — ABNORMAL LOW (ref >90)
GFR, EST NON AFRICAN AMERICAN: 25 mL/min — AB (ref >90)
Glucose, Bld: 340 mg/dL — ABNORMAL HIGH (ref 70–99)
POTASSIUM: 4.5 meq/L (ref 3.7–5.3)
Sodium: 129 mEq/L — ABNORMAL LOW (ref 137–147)

## 2013-04-14 LAB — CBC
HEMATOCRIT: 23.5 % — AB (ref 36.0–46.0)
HEMOGLOBIN: 7.6 g/dL — AB (ref 12.0–15.0)
MCH: 29.8 pg (ref 26.0–34.0)
MCHC: 32.3 g/dL (ref 30.0–36.0)
MCV: 92.2 fL (ref 78.0–100.0)
Platelets: 214 10*3/uL (ref 150–400)
RBC: 2.55 MIL/uL — ABNORMAL LOW (ref 3.87–5.11)
RDW: 13.9 % (ref 11.5–15.5)
WBC: 4.2 10*3/uL (ref 4.0–10.5)

## 2013-04-14 LAB — GLUCOSE, CAPILLARY
GLUCOSE-CAPILLARY: 332 mg/dL — AB (ref 70–99)
GLUCOSE-CAPILLARY: 379 mg/dL — AB (ref 70–99)
GLUCOSE-CAPILLARY: 357 mg/dL — AB (ref 70–99)
Glucose-Capillary: 289 mg/dL — ABNORMAL HIGH (ref 70–99)
Glucose-Capillary: 300 mg/dL — ABNORMAL HIGH (ref 70–99)

## 2013-04-14 MED ORDER — FUROSEMIDE 10 MG/ML IJ SOLN
20.0000 mg | Freq: Every day | INTRAMUSCULAR | Status: DC
  Administered 2013-04-14 – 2013-04-15 (×2): 20 mg via INTRAVENOUS
  Filled 2013-04-14 (×2): qty 2

## 2013-04-14 NOTE — Progress Notes (Signed)
Inpatient Diabetes Program Recommendations  AACE/ADA: New Consensus Statement on Inpatient Glycemic Control (2013)  Target Ranges:  Prepandial:   less than 140 mg/dL      Peak postprandial:   less than 180 mg/dL (1-2 hours)      Critically ill patients:  140 - 180 mg/dL   Reason for Visit: Hyperglycemia Results for [name], [name] (MRN [id number]) as of 04/14/2013 15:04  Ref. Range 04/13/2013 22:10 04/14/2013 07:39 04/14/2013 11:30  Glucose-Capillary Latest Range: 70-99 mg/dL 332 (H) 289 (H) 300 (H)   On Prednisone 60 mg Steroid induced hyperglycemia  Inpatient Diabetes Program Recommendations Insulin - Basal: Lantus 10 QHS - if FBS continues >180 mg/dL, increase Lantus to 12 units QHS Insulin - Meal Coverage: Consider addition of meal coverage insulin - Novolog 3 units tidwc if pt eats >50% meal HgbA1C: 7.2% - reasonable control Diet: CHO mod med  Note: Will follow. Thank you. Lorenda Peck, RD, LDN, CDE Inpatient Diabetes Coordinator [phone]

## 2013-04-14 NOTE — Care Management Note (Addendum)
    Page 1 of 2   04/18/2013     3:53:57 PM   CARE MANAGEMENT NOTE 04/18/2013  Patient:  [name] [name], [name] [name]   Account Number:  [id number]  Date Initiated:  04/14/2013  Documentation initiated by:  [facility]  Subjective/Objective Assessment:   80 Y/O F ADMITTED W/RESPIRATORY FAILURE.CV:ELFYBOF AFIB,BREAST CA.     Action/Plan:   FROM HOME.HAS CANE,RW.HAS PCP,PHARMACY.   Anticipated DC Date:  04/19/2013   Anticipated DC Plan:  [place]  CM consult      Choice offered to / List presented to:  C-1 Patient   DME arranged  [place]      DME agency  [place] arranged  [facility]   Status of service:  Completed, signed off Medicare Important Message given?   (If response is "NO", the following Medicare IM given date fields will be blank) Date Medicare IM given:   Date Additional Medicare IM given:    Discharge Disposition:  [place]  Per UR Regulation:  Reviewed for med. necessity/level of care/duration of stay  If discussed at Long Length of Stay Meetings, dates discussed:    Comments:  04/18/13 Jewett Mcgann RN,BSN NCM [phone] 3:50P RECEIVED CALL FROM NURSE ABOUT 3N1 RECOMMENDED FOR PATIENT BY OT.PATIENT ALREADY D/C,&  LEFT BLDG.NURSE TO CONTACT MD FOR 3N1 ORDER.I HAVE CALLED [facility] REP KRISTEN TO ARRANGE FOR DELIVERY OF 3N1,[facility] WILL CONTACT PATIENT @ HOME FOR ARRANGEMENTS.  NOTED HHPT/OT ORDERS PLACED.[facility] DEBBIE W AWARE OF HHC ORDER, & D/C. [facility] REP DEBBIE AWARE OF HHPT/OT RECOMMENDED.AWAIT FINAL HHPT/OT ORDER,& FACE TO FACE.  04/15/13 Maryum Batterson RN,BSN NCM Uncertain CARE IN THE PAST,WOULD WANT TO USE THEM AGAIN IF HHC ORDERED.TC [facility] REP MARY AWARE OF REFFERAL & FOLLOWING IF HHC ORDERED.RECOMMENDED PT/OT CONS.PROVIDED PATIENT W/[employer].[place] FOR MEDICINE DISCOUNTS/COUPONS.  04/14/13 Wylma Tatem  RN,BSN NCM [phone] WOULD RECOMMEND PT/OT CONS.

## 2013-04-14 NOTE — Progress Notes (Signed)
[facility] PROGRESS NOTE  [name] [name] VPX:[id number] DOB: October 04, 1932 DOA: 04/13/2013 PCP: [name], MD  Assessment/Plan: acute respiratory failure  -The patient's clinical presentation is not clear-cut although given the patient's history and presentation and wheezing I would lean more toward exacerbation of the patient's granulomatous polyangiitis or exacerbation of BOOP  -Certainly, fluid overload and CHF remain in the differential diagnosis  -Obtain CT chest to clarify pulmonary findings  -May need pulmonary consultation in the morning  -Start Solu-Medrol IV  -Sputum culture  -Continue levofloxacin  -Influenza PCR-Continue long-acting beta agonist Elevated proBNP  -BNP up to 13K from 1K in less than a month -Although CHF exacerbation remains in the differential diagnosis, this may be elevated due to tachycardia and her acute pulmonary process  -Repeat echocardiogram  -Suspect a degree of pulmonary hypertension possibly right heart failure  -Continue lasix, but will increase to IV dose - Follow I/O and renal function Hypertension  -Continue amlodipine and bystolic  -held olmesartan for now  Hypothyroidism  -Continue Synthroid  Breast cancer  -In remission  -follows Dr. Jana Hakim  Atrial fibrillation  -continue Eliquis  -continue BB  Hyperlipidemia  -Continue statin  CKD stage III  -Baseline creatinine 1.5-1.8  Diabetes mellitus type 2  -Continue home dose of Lantus  -NovoLog sliding scale  Code Status: Full Family Communication: Pt in room (indicate person spoken with, relationship, and if by phone, the number) Disposition Plan: Pending   Antibiotics:  Levaquin 04/13/13>>>  HPI/Subjective: No acute events noted overnight  Objective: Filed Vitals:   04/13/13 1816 04/13/13 1918 04/13/13 2054 04/14/13 0615  BP: 131/66 119/54 133/71 145/72  Pulse: 91 96 102 77  Temp: 99.2 F (37.3 C) 98.6 F (37 C) 97.7 F (36.5 C) 97.8 F (36.6 C)  TempSrc:  Oral Oral Oral Oral  Resp: _0 Height:   5' 5.5" (1.664 m)   Weight:   94.348 kg (208 lb)   SpO2: 96% 96% 99% 100%    Intake/Output Summary (Last 24 hours) at 04/14/13 1001 Last data filed at 04/14/13 0957  Gross per 24 hour  Intake      0 ml  Output    200 ml  Net   -200 ml   Filed Weights   04/13/13 2054  Weight: 94.348 kg (208 lb)    Exam:   General:  Awake, in nad  Cardiovascular: regular, s1, s2  Respiratory: normal resp effort, no wheezing  Abdomen: soft, nondistended  Musculoskeletal: perfused, no clubbing   Data Reviewed: Basic Metabolic Panel:  Recent Labs Lab 04/13/13 1645 04/13/13 1653 04/14/13 0526  NA 132* 132* 129*  K 3.5* 3.4* 4.5  CL 91* 93* 89*  CO2 27  --  27  GLUCOSE 174* 171* 340*  BUN 21 23 27*  CREATININE 1.76* 1.80* 1.87*  CALCIUM 9.1  --  8.6   Liver Function Tests: No results found for this basename: AST, ALT, ALKPHOS, BILITOT, PROT, ALBUMIN,  in the last 168 hours No results found for this basename: LIPASE, AMYLASE,  in the last 168 hours No results found for this basename: AMMONIA,  in the last 168 hours CBC:  Recent Labs Lab 04/13/13 1410 04/13/13 1653 04/14/13 0526  WBC 7.0  --  4.2  NEUTROABS 4.3  --   --   HGB 8.6* 8.8* 7.6*  HCT 25.3* 26.0* 23.5*  MCV 91.7  --  92.2  PLT 214  --  214   Cardiac Enzymes:  Recent Labs Lab  04/13/13 1645  TROPONINI <0.30   BNP (last 3 results)  Recent Labs  12/23/12 1320 03/07/13 1715 04/13/13 1645  PROBNP 7083.0* 1156.0* 13238.0*   CBG:  Recent Labs Lab 04/13/13 2210 04/14/13 0739  GLUCAP 332* 289*    No results found for this or any previous visit (from the past 240 hour(s)).   Studies: Dg Chest 2 View  04/13/2013   CLINICAL DATA:  Productive cough and shortness of breath for 2 days.  EXAM: CHEST  2 VIEW  COMPARISON:  PA and lateral chest 12/23/2012.  FINDINGS: There is pulmonary vascular congestion and marked cardiomegaly, unchanged. No consolidative  process, pneumothorax or effusion.  IMPRESSION: Cardiomegaly and vascular congestion.  No acute process.   Electronically Signed   By: Inge Rise M.D.   On: 04/13/2013 14:45    Scheduled Meds: . amLODipine  5 mg Oral Daily  . apixaban  5 mg Oral BID  . atorvastatin  10 mg Oral q1800  . calcium citrate  200 mg of elemental calcium Oral Daily  . cholecalciferol  3,000 Units Oral Daily  . furosemide  20 mg Oral Daily  . gabapentin  100 mg Oral BID AC  . insulin aspart  0-9 Units Subcutaneous TID WC  . insulin glargine  10 Units Subcutaneous QHS  . [START ON 04/15/2013] levofloxacin  750 mg Oral Q48H  . levothyroxine  125 mcg Oral QAC breakfast  . methylPREDNISolone (SOLU-MEDROL) injection  60 mg Intravenous Q6H  . mometasone-formoterol  2 puff Inhalation BID  . nebivolol  10 mg Oral Daily  . vitamin B-6  50 mg Oral Daily   Continuous Infusions:   Active Problems:   HYPERTENSION   Cryptogenic organizing pneumonia   Atrial fibrillation   Acute respiratory failure  Time spent: 36mn   CHIU, SFort TottenHospitalists Pager [phone] If 7PM-7AM, please contact night-coverage at [email or web address], password [email or web address] [facility]1/11/2013, 10:01 AM  LOS: 1 day

## 2013-04-14 NOTE — Progress Notes (Signed)
  Echocardiogram 2D Echocardiogram has been performed.  Latorya Bautch 04/14/2013, 11:55 AM

## 2013-04-15 DIAGNOSIS — D649 Anemia, unspecified: Secondary | ICD-10-CM | POA: Diagnosis not present

## 2013-04-15 DIAGNOSIS — R0902 Hypoxemia: Secondary | ICD-10-CM | POA: Diagnosis not present

## 2013-04-15 DIAGNOSIS — R042 Hemoptysis: Secondary | ICD-10-CM

## 2013-04-15 DIAGNOSIS — J84116 Cryptogenic organizing pneumonia: Secondary | ICD-10-CM | POA: Diagnosis not present

## 2013-04-15 DIAGNOSIS — I4891 Unspecified atrial fibrillation: Secondary | ICD-10-CM | POA: Diagnosis not present

## 2013-04-15 DIAGNOSIS — E669 Obesity, unspecified: Secondary | ICD-10-CM

## 2013-04-15 LAB — COMPREHENSIVE METABOLIC PANEL
ALBUMIN: 2.9 g/dL — AB (ref 3.5–5.2)
ALK PHOS: 51 U/L (ref 39–117)
ALT: 13 U/L (ref 0–35)
AST: 31 U/L (ref 0–37)
BUN: 36 mg/dL — ABNORMAL HIGH (ref 6–23)
CO2: 24 mEq/L (ref 19–32)
Calcium: 8.7 mg/dL (ref 8.4–10.5)
Chloride: 88 mEq/L — ABNORMAL LOW (ref 96–112)
Creatinine, Ser: 1.84 mg/dL — ABNORMAL HIGH (ref 0.50–1.10)
GFR calc Af Amer: 29 mL/min — ABNORMAL LOW (ref >90)
GFR calc non Af Amer: 25 mL/min — ABNORMAL LOW (ref >90)
Glucose, Bld: 356 mg/dL — ABNORMAL HIGH (ref 70–99)
Potassium: 3.9 mEq/L (ref 3.7–5.3)
Sodium: 129 mEq/L — ABNORMAL LOW (ref 137–147)
TOTAL PROTEIN: 6.2 g/dL (ref 6.0–8.3)
Total Bilirubin: 0.5 mg/dL (ref 0.3–1.2)

## 2013-04-15 LAB — GLUCOSE, CAPILLARY
GLUCOSE-CAPILLARY: 319 mg/dL — AB (ref 70–99)
GLUCOSE-CAPILLARY: 345 mg/dL — AB (ref 70–99)
GLUCOSE-CAPILLARY: 441 mg/dL — AB (ref 70–99)
GLUCOSE-CAPILLARY: 367 mg/dL — AB (ref 70–99)
Glucose-Capillary: 407 mg/dL — ABNORMAL HIGH (ref 70–99)
Glucose-Capillary: 409 mg/dL — ABNORMAL HIGH (ref 70–99)
Glucose-Capillary: 470 mg/dL — ABNORMAL HIGH (ref 70–99)

## 2013-04-15 LAB — CBC WITH DIFFERENTIAL/PLATELET
BAND NEUTROPHILS: 9 % (ref 0–10)
BASOS ABS: 0 10*3/uL (ref 0.0–0.1)
BASOS PCT: 0 % (ref 0–1)
EOS PCT: 0 % (ref 0–5)
Eosinophils Absolute: 0 10*3/uL (ref 0.0–0.7)
HEMATOCRIT: 21.8 % — AB (ref 36.0–46.0)
HEMOGLOBIN: 7.6 g/dL — AB (ref 12.0–15.0)
LYMPHS PCT: 7 % — AB (ref 12–46)
Lymphs Abs: 0.6 10*3/uL — ABNORMAL LOW (ref 0.7–4.0)
MCH: 30.9 pg (ref 26.0–34.0)
MCHC: 34.9 g/dL (ref 30.0–36.0)
MCV: 88.6 fL (ref 78.0–100.0)
MONOS PCT: 5 % (ref 3–12)
Metamyelocytes Relative: 2 %
Monocytes Absolute: 0.4 10*3/uL (ref 0.1–1.0)
Myelocytes: 1 %
Neutro Abs: 7.4 10*3/uL (ref 1.7–7.7)
Neutrophils Relative %: 76 % (ref 43–77)
Platelets: 222 10*3/uL (ref 150–400)
RBC: 2.46 MIL/uL — ABNORMAL LOW (ref 3.87–5.11)
RDW: 13.3 % (ref 11.5–15.5)
WBC: 8.4 10*3/uL (ref 4.0–10.5)

## 2013-04-15 LAB — GLUCOSE, RANDOM: GLUCOSE: 443 mg/dL — AB (ref 70–99)

## 2013-04-15 MED ORDER — FUROSEMIDE 20 MG PO TABS
20.0000 mg | ORAL_TABLET | Freq: Every day | ORAL | Status: DC
  Administered 2013-04-15 – 2013-04-16 (×2): 20 mg via ORAL
  Filled 2013-04-15 (×3): qty 1

## 2013-04-15 MED ORDER — METHYLPREDNISOLONE SODIUM SUCC 125 MG IJ SOLR
60.0000 mg | Freq: Two times a day (BID) | INTRAMUSCULAR | Status: DC
  Administered 2013-04-15 – 2013-04-17 (×5): 60 mg via INTRAVENOUS
  Filled 2013-04-15 (×7): qty 0.96

## 2013-04-15 MED ORDER — INSULIN ASPART 100 UNIT/ML ~~LOC~~ SOLN
4.0000 [IU] | Freq: Three times a day (TID) | SUBCUTANEOUS | Status: DC
  Administered 2013-04-15 – 2013-04-18 (×9): 4 [IU] via SUBCUTANEOUS

## 2013-04-15 MED ORDER — INSULIN GLARGINE 100 UNIT/ML ~~LOC~~ SOLN
18.0000 [IU] | Freq: Every day | SUBCUTANEOUS | Status: DC
Start: 2013-04-15 — End: 2013-04-16
  Administered 2013-04-15: 18 [IU] via SUBCUTANEOUS
  Filled 2013-04-15 (×3): qty 0.18

## 2013-04-15 MED ORDER — INSULIN ASPART 100 UNIT/ML ~~LOC~~ SOLN
10.0000 [IU] | Freq: Once | SUBCUTANEOUS | Status: AC
  Administered 2013-04-15: 10 [IU] via SUBCUTANEOUS

## 2013-04-15 MED ORDER — INSULIN ASPART 100 UNIT/ML ~~LOC~~ SOLN
10.0000 [IU] | Freq: Once | SUBCUTANEOUS | Status: AC
  Administered 2013-04-15: 19:00:00 10 [IU] via SUBCUTANEOUS

## 2013-04-15 NOTE — Clinical Documentation Improvement (Signed)
Pt with PNA, Acute Resp Failure  Clarification Needed  Pt with volume overload and CHF.  Please clarify the acuity/type of CHF for this admission and document in pn or d/c summary.   Possible Clinical Conditions?  Chronic Systolic Congestive Heart Failure Chronic Diastolic Congestive Heart Failure Chronic Systolic & Diastolic Congestive Heart Failure Acute Systolic Congestive Heart Failure Acute Diastolic Congestive Heart Failure Acute Systolic & Diastolic Congestive Heart Failure Acute on Chronic Systolic Congestive Heart Failure Acute on Chronic Diastolic Congestive Heart Failure Acute on Chronic Systolic & Diastolic Congestive Heart Failure Other Condition________________________________________ Cannot Clinically Determine  Supporting Information:  Risk Factors: Signs & Symptoms: Pn 04/14/13 Assessment/Plan:  -Certainly, fluid overload and CHF remain in the differential diagnosis  -Although CHF exacerbation remains in the differential diagnosis, this may be elevated due to tachycardia and her acute pulmonary process   Diagnostics: Component      Pro B Natriuretic peptide (BNP)  Latest Ref Rng      0 - 450 pg/mL  04/13/2013     4:45 PM 13238.0 (H)   Treatment: Monitoring  Pro b natriuretic peptide  Troponin I furosemide (LASIX) injection 40 mg   Thank You, Heloise Beecham RN, BSN, MSN/Inf, CCDS Clinical Documentation Specialist [facility] Dept Pager: [phone]  [phone]  [place] Information Management

## 2013-04-15 NOTE — Progress Notes (Signed)
[facility] PROGRESS NOTE  [name] [name] HYQ:[id number] DOB: 23-May-1932 DOA: 04/13/2013 PCP: Kristian Covey, MD  Assessment/Plan: acute respiratory failure  -The patient's clinical presentation is not clear-cut although given the patient's history and presentation and wheezing I would lean more toward exacerbation of the patient's granulomatous polyangiitis or exacerbation of BOOP  -Certainly, fluid overload and CHF remain in the differential diagnosis  -CT chest with findings of patchy ground glass opacities in both lungs -Started Solu-Medrol IV, improving -Sputum culture  -Continue levofloxacin  -Influenza PCR neg-Continue long-acting beta agonist Elevated proBNP  -BNP up to 13K from 1K in less than a month -Although CHF exacerbation remains in the differential diagnosis, this may be elevated due to tachycardia and her acute pulmonary process  -Repeat echocardiogram with normal EF -Suspect a degree of pulmonary hypertension possibly right heart failure  -Continue lasix, reduce to home dose - Follow I/O and renal function Hypertension  -Continue amlodipine and bystolic  -held olmesartan for now  Hypothyroidism  -Continue Synthroid  Breast cancer  -In remission  -follows Dr. Darnelle Catalan  Atrial fibrillation  -continue Eliquis  -continue BB  Hyperlipidemia  -Continue statin  CKD stage III  -Baseline creatinine 1.5-1.8  Diabetes mellitus type 2  -Continue home dose of Lantus  -NovoLog sliding scale  Code Status: Full Family Communication: Pt in room (indicate person spoken with, relationship, and if by phone, the number) Disposition Plan: Pending   Antibiotics:  Levaquin 04/13/13>>>  HPI/Subjective: No acute events noted overnight. Eager to go home  Objective: Filed Vitals:   04/14/13 2024 04/14/13 2223 04/15/13 0434 04/15/13 1300  BP:  156/91 150/85 113/54  Pulse:  96 81 76  Temp:  97.9 F (36.6 C) 97.7 F (36.5 C) 98.2 F (36.8 C)  TempSrc:  Oral Oral  Oral  Resp:  18 20 17   Height:      Weight:   91.8 kg (202 lb 6.1 oz)   SpO2: 98% 100% 98% 95%    Intake/Output Summary (Last 24 hours) at 04/15/13 1538 Last data filed at 04/15/13 1252  Gross per 24 hour  Intake    360 ml  Output   1900 ml  Net  -1540 ml   Filed Weights   04/13/13 2054 04/15/13 0434  Weight: 94.348 kg (208 lb) 91.8 kg (202 lb 6.1 oz)    Exam:   General:  Awake, in nad  Cardiovascular: regular, s1, s2  Respiratory: normal resp effort, no wheezing  Abdomen: soft, nondistended  Musculoskeletal: perfused, no clubbing   Data Reviewed: Basic Metabolic Panel:  Recent Labs Lab 04/13/13 1645 04/13/13 1653 04/14/13 0526 04/15/13 0535  NA 132* 132* 129* 129*  K 3.5* 3.4* 4.5 3.9  CL 91* 93* 89* 88*  CO2 27  --  27 24  GLUCOSE 174* 171* 340* 356*  BUN 21 23 27* 36*  CREATININE 1.76* 1.80* 1.87* 1.84*  CALCIUM 9.1  --  8.6 8.7   Liver Function Tests:  Recent Labs Lab 04/15/13 0535  AST 31  ALT 13  ALKPHOS 51  BILITOT 0.5  PROT 6.2  ALBUMIN 2.9*   No results found for this basename: LIPASE, AMYLASE,  in the last 168 hours No results found for this basename: AMMONIA,  in the last 168 hours CBC:  Recent Labs Lab 04/13/13 1410 04/13/13 1653 04/14/13 0526 04/15/13 0535  WBC 7.0  --  4.2 8.4  NEUTROABS 4.3  --   --  7.4  HGB 8.6* 8.8* 7.6* 7.6*  HCT 25.3*  26.0* 23.5* 21.8*  MCV 91.7  --  92.2 88.6  PLT 214  --  214 222   Cardiac Enzymes:  Recent Labs Lab 04/13/13 1645  TROPONINI <0.30   BNP (last 3 results)  Recent Labs  12/23/12 1320 03/07/13 1715 04/13/13 1645  PROBNP 7083.0* 1156.0* 13238.0*   CBG:  Recent Labs Lab 04/14/13 1130 04/14/13 1652 04/14/13 2220 04/15/13 0721 04/15/13 1138  GLUCAP 300* 379* 357* 319* 345*    No results found for this or any previous visit (from the past 240 hour(s)).   Studies: Ct Chest Wo Contrast  04/14/2013   CLINICAL DATA:  Wegener's granulomatosis with abnormal chest x-ray.  Cough.  EXAM: CT CHEST WITHOUT CONTRAST  TECHNIQUE: Multidetector CT imaging of the chest was performed following the standard protocol without IV contrast.  COMPARISON:  Chest CT 11/01/2012.  FINDINGS: The chest wall is unremarkable and stable. No breast masses, supraclavicular or axillary adenopathy. Small scattered lymph nodes are noted.  The bony thorax is intact. No destructive bone lesions or spinal canal compromise. Moderate osteoporosis.  The heart is enlarged but stable. Three-vessel coronary artery calcifications are noted. No mediastinal or hilar mass or adenopathy. The pulmonary arteries are mildly enlarged. The aorta is normal in caliber. Mild tortuosity and calcification. The esophagus is grossly normal. There is a small hiatal hernia.  Examination of the lung parenchyma demonstrates patchy D ground-glass opacity and the age nodularity. This suggests a partial airspace filling process and could be pulmonary edema, pulmonary hemorrhage or infectious or inflammatory alveolitis. No focal airspace consolidation to suggest pneumonia. No worrisome pulmonary lesions. No pleural effusion. No bronchiectasis. There is a calcified granuloma noted in the right lower lobe.  The upper abdomen is grossly normal. There appears to be a the new cystic area in the pancreatic tail/ body junction region. This is incompletely evaluated. A followup CT scan with contrast (if possible) using pancreatic protocol is suggested. If the patient cannot have IV contrast an MRI of the abdomen without contrast may be more helpful.  IMPRESSION: 1. Stable cardiac enlargement and coronary artery calcifications. 2. Patchy ground-glass opacity in both lungs with vague nodularity. Findings suggest a partial airspace filling process such as edema, hemorrhage or inflammatory or infectious alveolitis. No focal airspace consolidation. 3. Possible new cystic lesion in the pancreatic body/tail junction region. Please see above discussion.    Electronically Signed   By: Loralie Champagne M.D.   On: 04/14/2013 10:56    Scheduled Meds: . amLODipine  5 mg Oral Daily  . apixaban  5 mg Oral BID  . atorvastatin  10 mg Oral q1800  . calcium citrate  200 mg of elemental calcium Oral Daily  . cholecalciferol  3,000 Units Oral Daily  . furosemide  20 mg Intravenous Daily  . gabapentin  100 mg Oral BID AC  . insulin aspart  0-9 Units Subcutaneous TID WC  . insulin glargine  10 Units Subcutaneous QHS  . levofloxacin  750 mg Oral Q48H  . levothyroxine  125 mcg Oral QAC breakfast  . methylPREDNISolone (SOLU-MEDROL) injection  60 mg Intravenous Q12H  . mometasone-formoterol  2 puff Inhalation BID  . nebivolol  10 mg Oral Daily  . vitamin B-6  50 mg Oral Daily   Continuous Infusions:   Active Problems:   HYPERTENSION   Cryptogenic organizing pneumonia   Atrial fibrillation   Acute respiratory failure  Time spent:  Kaye Mitro K  [facility] Pager [phone]. If 7PM-7AM, please contact night-coverage  at [email or web address], password [facility] 04/15/2013, 3:38 PM  LOS: 2 days

## 2013-04-15 NOTE — Progress Notes (Signed)
Pt's CBG at 1700 was 441. 10 units of regular insulin given per MD verbal order along with 4 units of regular insulin ordered at 1725.   CBG at 1825 was 409. MD was notified. Pt asymptomatic. Awaiting further orders. Will continue to monitor.

## 2013-04-15 NOTE — Progress Notes (Signed)
Inpatient Diabetes Program Recommendations  AACE/ADA: New Consensus Statement on Inpatient Glycemic Control (2013)  Target Ranges:  Prepandial:   less than 140 mg/dL      Peak postprandial:   less than 180 mg/dL (1-2 hours)      Critically ill patients:  140 - 180 mg/dL   Reason for Visit: Hyperglycemia  Pt states she checks blood sugars frequently at home and "usually they are good."  Results for CRISTIE, MCKINNEY (MRN [id number]) as of 04/15/2013 15:08  Ref. Range 04/14/2013 11:30 04/14/2013 16:52 04/14/2013 22:20 04/15/2013 07:21 04/15/2013 11:38  Glucose-Capillary Latest Range: 70-99 mg/dL 300 (H) 379 (H) 357 (H) 319 (H) 345 (H)  Results for REYONNA, HAACK (MRN [id number]) as of 04/15/2013 15:08  Ref. Range 03/07/2013 17:15  Hemoglobin A1C Latest Range: 4.6-6.5 % 7.2 (H)  Hyperglycemia with high-dose steroids. HgbA1C indicates reasonable control at home.  Please consider increasing Lantus to 18 units QHS. Add Novolog 4 units tidwc if pt eats >50% meal. May benefit from Novolog HS correction.  Will continue to follow. Thank you. Lorenda Peck, RD, LDN, CDE Inpatient Diabetes Coordinator [phone]

## 2013-04-16 ENCOUNTER — Inpatient Hospital Stay (HOSPITAL_COMMUNITY): Payer: Medicare Other

## 2013-04-16 DIAGNOSIS — J84116 Cryptogenic organizing pneumonia: Secondary | ICD-10-CM | POA: Diagnosis not present

## 2013-04-16 DIAGNOSIS — F329 Major depressive disorder, single episode, unspecified: Secondary | ICD-10-CM

## 2013-04-16 DIAGNOSIS — R0902 Hypoxemia: Secondary | ICD-10-CM | POA: Diagnosis not present

## 2013-04-16 DIAGNOSIS — J811 Chronic pulmonary edema: Secondary | ICD-10-CM | POA: Diagnosis not present

## 2013-04-16 DIAGNOSIS — F3289 Other specified depressive episodes: Secondary | ICD-10-CM

## 2013-04-16 DIAGNOSIS — I4891 Unspecified atrial fibrillation: Secondary | ICD-10-CM | POA: Diagnosis not present

## 2013-04-16 DIAGNOSIS — D649 Anemia, unspecified: Secondary | ICD-10-CM | POA: Diagnosis not present

## 2013-04-16 LAB — GLUCOSE, CAPILLARY
Glucose-Capillary: 241 mg/dL — ABNORMAL HIGH (ref 70–99)
Glucose-Capillary: 263 mg/dL — ABNORMAL HIGH (ref 70–99)
Glucose-Capillary: 233 mg/dL — ABNORMAL HIGH (ref 70–99)
Glucose-Capillary: 213 mg/dL — ABNORMAL HIGH (ref 70–99)

## 2013-04-16 LAB — BASIC METABOLIC PANEL
BUN: 41 mg/dL — ABNORMAL HIGH (ref 6–23)
CALCIUM: 8.4 mg/dL (ref 8.4–10.5)
CO2: 29 mEq/L (ref 19–32)
Chloride: 90 mEq/L — ABNORMAL LOW (ref 96–112)
Creatinine, Ser: 1.85 mg/dL — ABNORMAL HIGH (ref 0.50–1.10)
GFR calc Af Amer: 29 mL/min — ABNORMAL LOW (ref >90)
GFR, EST NON AFRICAN AMERICAN: 25 mL/min — AB (ref >90)
Glucose, Bld: 261 mg/dL — ABNORMAL HIGH (ref 70–99)
Potassium: 3.6 mEq/L — ABNORMAL LOW (ref 3.7–5.3)
SODIUM: 131 meq/L — AB (ref 137–147)

## 2013-04-16 MED ORDER — FUROSEMIDE 10 MG/ML IJ SOLN
20.0000 mg | Freq: Every day | INTRAMUSCULAR | Status: DC
  Administered 2013-04-17 – 2013-04-18 (×2): 20 mg via INTRAVENOUS
  Filled 2013-04-16 (×2): qty 2

## 2013-04-16 MED ORDER — INSULIN GLARGINE 100 UNIT/ML ~~LOC~~ SOLN
20.0000 [IU] | Freq: Every day | SUBCUTANEOUS | Status: DC
  Administered 2013-04-16 – 2013-04-17 (×2): 20 [IU] via SUBCUTANEOUS
  Filled 2013-04-16 (×3): qty 0.2

## 2013-04-16 NOTE — Progress Notes (Signed)
[facility] PROGRESS NOTE  [name] [name] QMV:[id number] DOB: [birth year] DOA: 04/13/2013 PCP: [name], MD  Assessment/Plan: acute respiratory failure  -The patient's clinical presentation is not clear-cut although given the patient's history and presentation and wheezing I would lean more toward exacerbation of the patient's granulomatous polyangiitis or exacerbation of BOOP  -Certainly, fluid overload and CHF remain in the differential diagnosis  -CT chest with findings of patchy ground glass opacities in both lungs -Started Solu-Medrol IV, improving -Sputum culture  -Continue levofloxacin  -Influenza PCR neg-Continue long-acting beta agonist Elevated proBNP  -BNP up to 13K from 1K in less than a month -Although CHF exacerbation remains in the differential diagnosis, this may be elevated due to tachycardia and her acute pulmonary process  -Repeat echocardiogram with normal EF -Suspect a degree of pulmonary hypertension possibly right heart failure  -Continue lasix, reduce to home dose - Follow I/O and renal function Hypertension  -Continue amlodipine and bystolic  -held olmesartan for now  Hypothyroidism  -Continue Synthroid  Breast cancer  -In remission  -follows Dr. Jana Hakim  Atrial fibrillation  -continue Eliquis  -continue BB  Hyperlipidemia  -Continue statin  CKD stage III  -Baseline creatinine 1.5-1.8  Diabetes mellitus type 2  -Continue home dose of Lantus  -NovoLog sliding scale  Code Status: Full Family Communication: Pt in room (indicate person spoken with, relationship, and if by phone, the number) Disposition Plan: Pending   Antibiotics:  Levaquin 04/13/13>>>  HPI/Subjective: No acute events noted overnight. Eager to go home.  Objective: Filed Vitals:   04/15/13 2111 04/15/13 2141 04/16/13 0300 04/16/13 1413  BP: 129/74  155/89 108/58  Pulse: 85  91 90  Temp: 97.8 F (36.6 C)  97.5 F (36.4 C) 97.8 F (36.6 C)  TempSrc: Oral  Oral  Oral  Resp: _0 Height:      Weight:   92.2 kg (203 lb 4.2 oz)   SpO2: 100% 100% 98% 96%    Intake/Output Summary (Last 24 hours) at 04/16/13 1506 Last data filed at 04/16/13 1300  Gross per 24 hour  Intake    840 ml  Output   1850 ml  Net  -1010 ml   Filed Weights   04/13/13 2054 04/15/13 0434 04/16/13 0300  Weight: 94.348 kg (208 lb) 91.8 kg (202 lb 6.1 oz) 92.2 kg (203 lb 4.2 oz)    Exam:   General:  Awake, in nad  Cardiovascular: regular, s1, s2  Respiratory: normal resp effort, no wheezing  Abdomen: soft, nondistended  Musculoskeletal: perfused, no clubbing   Data Reviewed: Basic Metabolic Panel:  Recent Labs Lab 04/13/13 1645 04/13/13 1653 04/14/13 0526 04/15/13 0535 04/15/13 1725 04/16/13 0806  NA 132* 132* 129* 129*  --  131*  K 3.5* 3.4* 4.5 3.9  --  3.6*  CL 91* 93* 89* 88*  --  90*  CO2 27  --  27 24  --  29  GLUCOSE 174* 171* 340* 356* 443* 261*  BUN 21 23 27* 36*  --  41*  CREATININE 1.76* 1.80* 1.87* 1.84*  --  1.85*  CALCIUM 9.1  --  8.6 8.7  --  8.4   Liver Function Tests:  Recent Labs Lab 04/15/13 0535  AST 31  ALT 13  ALKPHOS 51  BILITOT 0.5  PROT 6.2  ALBUMIN 2.9*   No results found for this basename: LIPASE, AMYLASE,  in the last 168 hours No results found for this basename: AMMONIA,  in the last  168 hours CBC:  Recent Labs Lab 04/13/13 1410 04/13/13 1653 04/14/13 0526 04/15/13 0535  WBC 7.0  --  4.2 8.4  NEUTROABS 4.3  --   --  7.4  HGB 8.6* 8.8* 7.6* 7.6*  HCT 25.3* 26.0* 23.5* 21.8*  MCV 91.7  --  92.2 88.6  PLT 214  --  214 222   Cardiac Enzymes:  Recent Labs Lab 04/13/13 1645  TROPONINI <0.30   BNP (last 3 results)  Recent Labs  12/23/12 1320 03/07/13 1715 04/13/13 1645  PROBNP 7083.0* 1156.0* 13238.0*   CBG:  Recent Labs Lab 04/15/13 1822 04/15/13 1946 04/15/13 2107 04/16/13 0726 04/16/13 1219  GLUCAP 409* 470* 367* 241* 263*    No results found for this or any previous visit  (from the past 240 hour(s)).   Studies: No results found.  Scheduled Meds: . amLODipine  5 mg Oral Daily  . apixaban  5 mg Oral BID  . atorvastatin  10 mg Oral q1800  . calcium citrate  200 mg of elemental calcium Oral Daily  . cholecalciferol  3,000 Units Oral Daily  . furosemide  20 mg Oral Daily  . gabapentin  100 mg Oral BID AC  . insulin aspart  0-9 Units Subcutaneous TID WC  . insulin aspart  4 Units Subcutaneous TID WC  . insulin glargine  18 Units Subcutaneous QHS  . levofloxacin  750 mg Oral Q48H  . levothyroxine  125 mcg Oral QAC breakfast  . methylPREDNISolone (SOLU-MEDROL) injection  60 mg Intravenous Q12H  . mometasone-formoterol  2 puff Inhalation BID  . nebivolol  10 mg Oral Daily  . vitamin B-6  50 mg Oral Daily   Continuous Infusions:   Active Problems:   HYPERTENSION   Cryptogenic organizing pneumonia   Atrial fibrillation   Acute respiratory failure  Time spent: 11mn  CHIU, SHillsboro BeachHospitalists Pager [phone] If 7PM-7AM, please contact night-coverage at [email or web address], password [facility]1/01/2014, 3:06 PM  LOS: 3 days

## 2013-04-16 NOTE — Progress Notes (Signed)
MD ordered additional 10 units of Novolog. Order placed and administered. Reported to night nurse. Night nurse will recheck and assess. Patient asymptomatic.

## 2013-04-17 DIAGNOSIS — J40 Bronchitis, not specified as acute or chronic: Secondary | ICD-10-CM

## 2013-04-17 DIAGNOSIS — K862 Cyst of pancreas: Secondary | ICD-10-CM

## 2013-04-17 DIAGNOSIS — M1990 Unspecified osteoarthritis, unspecified site: Secondary | ICD-10-CM

## 2013-04-17 DIAGNOSIS — J9600 Acute respiratory failure, unspecified whether with hypoxia or hypercapnia: Secondary | ICD-10-CM | POA: Diagnosis not present

## 2013-04-17 DIAGNOSIS — I4891 Unspecified atrial fibrillation: Secondary | ICD-10-CM | POA: Diagnosis not present

## 2013-04-17 DIAGNOSIS — R0902 Hypoxemia: Secondary | ICD-10-CM | POA: Diagnosis not present

## 2013-04-17 DIAGNOSIS — D649 Anemia, unspecified: Secondary | ICD-10-CM | POA: Diagnosis not present

## 2013-04-17 DIAGNOSIS — J84116 Cryptogenic organizing pneumonia: Secondary | ICD-10-CM | POA: Diagnosis not present

## 2013-04-17 LAB — GLUCOSE, CAPILLARY
GLUCOSE-CAPILLARY: 273 mg/dL — AB (ref 70–99)
GLUCOSE-CAPILLARY: 310 mg/dL — AB (ref 70–99)
Glucose-Capillary: 258 mg/dL — ABNORMAL HIGH (ref 70–99)
Glucose-Capillary: 312 mg/dL — ABNORMAL HIGH (ref 70–99)

## 2013-04-17 NOTE — Consult Note (Signed)
Name: [name] [name] MRN: [id number] DOB: 1933-02-17    ADMISSION DATE:  04/13/2013 CONSULTATION DATE:  04/17/13  REFERRING MD :  Rhona Leavens PRIMARY SERVICE:  TRH  CHIEF COMPLAINT: Short of breath  BRIEF PATIENT DESCRIPTION: 12 yoF former smoker followed at [facility] for hx BOOP/ chronic organizing pneumonia by lung bx, C-ANCA + c/w Wegener's lung/ kidney. Now presented with URI, T 101, progressive dyspnea. Pulmonary consulted for assessment.  SIGNIFICANT EVENTS / STUDIES:  CT chest  LINES / TUBES: PIV  CULTURES:   ANTIBIOTICS: Levaquin 04/15/13>>  HISTORY OF PRESENT ILLNESS:  As Per admitting HPI: 80 year old female with a history of Wegener's granulomatosis, chronic atrial fibrillation, BOOP,, metastatic intestinal carcinoid, diabetes mellitus, hypertension, and breast cancer in remission presents with 3 days of worsening shortness of breath. The patient complains of one-week duration of URI symptoms including increasing cough, watery eyes, runny nose, and low-grade fever for about one week. Today at home, the patient took her temperature, and it was 101.57F. The patient was recently started on Mucinex after which, the patient states that she has had increasing sputum production that is green in color. She has some nausea without any vomiting, diarrhea, abdominal pain, chest discomfort, hemoptysis. The patient has had hx of VATS bx revealing BOOP. She has been compliant with her Eliquis.  She was admitted in July 2014 for hemoptysis and worsening renal function and ultimately dx with granulomatosis with polyangiitis (Wegener's). Chest CT demonstrated no pulmonary embolism but did demonstrate alveolar hemorrhage pattern. She was initially diuresed with Lasix but had worsening creatinine. Infiltrates were felt to less likely be related to edema and Lasix was stopped. Echo 11/02/12: EF 55-60%, trivial AI, mod MR, mod to severe LAE, mild RVE, mod RAE, severe TR, severely increased PASP,  oscillating density in RA (chiari network). Eliquis was held until her hemoptysis cleared and was then resumed.  She reports she went to the emergency room in September 2014. This is apparently for symptoms more sinusitis. The CT was unremarkable. Her chest x-ray showed some mild infiltrates. Her BNP level was elevated in the 7000 range. This is actually down from 22,000 in July. She was given a dose of IV Lasix. I reviewed these points with her and family. Husband has some dementia, but son concurred.    PAST MEDICAL HISTORY :  Past Medical History  Diagnosis Date  . HYPOTHYROIDISM 07/24/2008  . HYPERLIPIDEMIA 07/24/2008  . DEPRESSION 05/16/2009  . HYPERTENSION 07/24/2008  . Vertigo   . Diabetes mellitus type II   . Arthritis     r tkr  . CVA (cerebral vascular accident) 2008    mini stroke.no residual  . Heart murmur     stress test 2009.dr peter [place]  . Intermittent vertigo   . Skin abnormality     facial lesions .pt applying mupiracin to areas  . Atrial fibrillation   . Breast cancer   . Pneumonia   . 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 Knee surgery  2009    TKR  . Cholecystectomy  1980  . Joint replacement      r knee  . Breast surgery  2007    Lumpectomy, XRT 2006.l breast  . Video assisted thoracoscopy  04/22/2011    Procedure: VIDEO ASSISTED THORACOSCOPY;  Surgeon: Loreli Slot, MD;  Location: [facility] OR;  Service: Thoracic;  Laterality: Left;  WITH BIOPSY  . Heel spur surgery Right   . Exploratory laparotomy  Prior to Admission medications   Medication Sig Start Date End Date Taking? Authorizing Provider  amLODipine-olmesartan (AZOR) 5-40 MG per tablet Take 1 tablet by mouth daily.   Yes Historical Provider, MD  apixaban (ELIQUIS) 5 MG TABS tablet Take 1 tablet (5 mg total) by mouth 2 (two) times daily. 04/15/12  Yes Peter M [place], MD  CALCIUM CITRATE PO Take 1,200 mg by mouth daily.     Yes Historical  Provider, MD  cholecalciferol (VITAMIN D) 1000 UNITS tablet Take 3,000 Units by mouth daily.     Yes Historical Provider, MD  diazepam (VALIUM) 5 MG tablet Take 5 mg by mouth daily as needed (vertigo.).   Yes Historical Provider, MD  furosemide (LASIX) 20 MG tablet Take 1 tablet (20 mg total) by mouth daily. 03/07/13  Yes Kristian Covey, MD  gabapentin (NEURONTIN) 100 MG capsule Take 1 capsule (100 mg total) by mouth 2 (two) times daily before a meal. 11/08/12  Yes Simonne Martinet, NP  Insulin Glargine (LANTUS SOLOSTAR) 100 UNIT/ML SOPN Inject 10 Units into the skin at bedtime. 03/25/13  Yes Kristian Covey, MD  levothyroxine (SYNTHROID, LEVOTHROID) 125 MCG tablet Take 125 mcg by mouth daily before breakfast.   Yes Historical Provider, MD  mometasone-formoterol (DULERA) 100-5 MCG/ACT AERO 2 puffs then rinse mouth, twice daily 01/17/13  Yes Rheta Hemmelgarn D Prestyn Mahn, MD  nebivolol (BYSTOLIC) 10 MG tablet Take 10 mg by mouth daily.   Yes Historical Provider, MD  predniSONE (DELTASONE) 10 MG tablet Take 10 mg by mouth daily with breakfast.   Yes Historical Provider, MD  Pyridoxine HCl (VITAMIN B-6 PO) Take 100 mg by mouth daily.    Yes Historical Provider, MD  rosuvastatin (CRESTOR) 5 MG tablet Take 1 tablet (5 mg total) by mouth daily. 04/20/12  Yes Peter M [place], MD  sulfamethoxazole-trimethoprim (BACTRIM DS) 800-160 MG per tablet Take 1 tablet by mouth every Monday, Wednesday, and Friday.   Yes Historical Provider, MD   Allergies  Allergen Reactions  . Codeine Sulfate     REACTION: GI upset  . Sulfonamide Derivatives     REACTION: GI upset  . Atarax [Hydroxyzine] Nausea Only and Rash    FAMILY HISTORY:  Family History  Problem Relation Age of Onset  . Arthritis Mother   . Rheum arthritis Mother   . Heart disease Father   . Coronary artery disease Father   . Cancer Sister     breast CA, both sisters  . Breast cancer Sister   . Coronary artery disease Brother   . Lung cancer Brother     SOCIAL HISTORY:  reports that she quit smoking about 30 years ago. Her smoking use included Cigarettes. She has a 6 pack-year smoking history. She has never used smokeless tobacco. She reports that she does not drink alcohol or use illicit drugs.  REVIEW OF SYSTEMS:  Positive if bolded Constitutional: Negative for fever, chills, weight loss, malaise/fatigue and diaphoresis.  HENT: Negative for hearing loss, ear pain, nosebleeds, congestion, sore throat, neck pain, tinnitus and ear discharge.   Eyes: Negative for blurred vision, double vision, photophobia, pain, discharge and redness.  Respiratory: Negative for cough, hemoptysis, sputum production, shortness of breath, wheezing and stridor.   Cardiovascular: Negative for chest pain, palpitations, orthopnea, claudication, leg swelling and PND.  Gastrointestinal: Negative for heartburn, nausea, vomiting, abdominal pain, diarrhea, constipation, blood in stool and melena.  Genitourinary: Negative for dysuria, urgency, frequency, hematuria and flank pain.  Musculoskeletal: Negative for myalgias, back pain,  joint pain and falls.  Skin: Negative for itching and rash.  Neurological: Negative for dizziness, tingling, tremors, sensory change, speech change, focal weakness, seizures, loss of consciousness, weakness and headaches. Unsteady balance. Limited walking at home. Endo/Heme/Allergies: Negative for environmental allergies and polydipsia. Does not bruise/bleed easily.  SUBJECTIVE:   VITAL SIGNS: Temp:  [97.7 F (36.5 C)-97.8 F (36.6 C)] 97.8 F (36.6 C) (01/11 0449) Pulse Rate:  [85-97] 85 (01/11 0634) Resp:  [20] 20 (01/11 0449) BP: (108-168)/(58-100) 152/94 mmHg (01/11 0634) SpO2:  [91 %-98 %] 91 % (01/11 0831) Weight:  [92.4 kg (203 lb 11.3 oz)] 92.4 kg (203 lb 11.3 oz) (01/11 0449)  PHYSICAL EXAMINATION: General:  Alert, pleasant, watching TV and visiting w family. Recognized me. Neuro:  Moves all extremities, non-focal,  oriented HEENT: mucosa moist, gross hearing and vision intact Neck:  No stridor or bruit Cardiovascular: IRR, no m/g/r, no periph edema Lungs:  Diminished. Few crackles, no cough, unlabored Abdomen:soft, BS+, no mass Musculoskeletal: symmetrical Skin:  ? Bruise lateral calf   Recent Labs Lab 04/14/13 0526 04/15/13 0535 04/15/13 1725 04/16/13 0806  NA 129* 129*  --  131*  K 4.5 3.9  --  3.6*  CL 89* 88*  --  90*  CO2 27 24  --  29  BUN 27* 36*  --  41*  CREATININE 1.87* 1.84*  --  1.85*  GLUCOSE 340* 356* 443* 261*    Recent Labs Lab 04/13/13 1410 04/13/13 1653 04/14/13 0526 04/15/13 0535  HGB 8.6* 8.8* 7.6* 7.6*  HCT 25.3* 26.0* 23.5* 21.8*  WBC 7.0  --  4.2 8.4  PLT 214  --  214 222   Dg Chest Port 1 View  04/16/2013   CLINICAL DATA:  Respiratory failure  EXAM: PORTABLE CHEST - 1 VIEW  COMPARISON:  04/13/2013  FINDINGS: Increased interstitial edema with associated significant cardiomegaly is suggestive of congestive heart failure. No pleural fluid is identified.  IMPRESSION: Worsening interstitial edema.   Electronically Signed   By: Irish Lack M.D.   On: 04/16/2013 15:12    ASSESSMENT / PLAN: 1) Acute illness, initially febrile- likely a superimposed respiratory viral illness. Negative flu assay does not rule this out, given the widespread flu outbreak currently.  P- treat supportively  2) Hx COP/BOOP by lung bx- doubt acute exacerbation now. Steroids would be therapeutic anyway.  3) Granulomatous polyangiitis-lung/ kidney- Wegener's. Previously Rx'd. She had areas of ground-glass noted on CT 08/02/12. Possibly that was an early manifestation of current process. She has tended to respond to steroids.  4) Metastatic carcinoid to lung- incidental finding on lung bx done to dx BOOP. No primary identified. No recurrence.  5) Breast Ca 2006- in remission  Recommend- Manage for now as viral pneumonitis with complicated background hx. Taper back to outpt regimen.  She is unsteady. Consider PT consult.  Terisa Starr, MD Pulmonary and Critical Care Medicine [facility] m [phone] Pager: [phone]  04/17/2013, 1:50 PM

## 2013-04-17 NOTE — Progress Notes (Signed)
[facility] PROGRESS NOTE  [name] [name] WYB:[id number] DOB: 08/07/1932 DOA: 04/13/2013 PCP: [name], MD  Assessment/Plan: acute respiratory failure  -Likely exacerbation of  granulomatous polyangiitis or exacerbation of BOOP, vs fluid overload and CHF -CT chest with findings of patchy ground glass opacities in both lungs -Started Solu-Medrol IV, symptoms had been improving however follow up CXR on 04/16/13 suggested worsening vascular congestion - Lasix increased to IV -Sputum culture  -Continue levofloxacin  -Influenza PCR neg-Continue long-acting beta agonist - Given complexity of her pulmonary history as well as her recent hospitalization for Wegener's flare in 8/14, had consulted PCCM for further recommendations Elevated proBNP  -BNP up to 13K from 1K in less than a month -Repeat echocardiogram with normal EF -Suspect a degree of pulmonary hypertension possibly right heart failure  -Continue lasix, back up to IV dose per above - Follow I/O and renal function Hypertension  -Continue amlodipine and bystolic  -held olmesartan for now  Hypothyroidism  -Continue Synthroid  Breast cancer  -In remission  -follows Dr. Jana Hakim  Atrial fibrillation  -continue Eliquis, no active bleed -continue BB  Hyperlipidemia  -Continue statin  CKD stage III  -Baseline creatinine 1.5-1.8  Diabetes mellitus type 2  -Continue home dose of Lantus  -NovoLog sliding scale Pancreatic cyst - This was a new incidental finding - Follow up MRI was ordered, pending  Code Status: Full Family Communication: Pt in room (indicate person spoken with, relationship, and if by phone, the number) Disposition Plan: Pending  Antibiotics:  Levaquin 04/13/13>>>  Consults: - PCCM  HPI/Subjective: Pt reports continuing to feel better. No acute events noted overnight.  Objective: Filed Vitals:   04/17/13 0449 04/17/13 0531 04/17/13 0634 04/17/13 0831  BP: 168/100 150/93 152/94   Pulse: 86   85   Temp: 97.8 F (36.6 C)     TempSrc: Oral     Resp: 20     Height:      Weight: 92.4 kg (203 lb 11.3 oz)     SpO2: 98%   91%    Intake/Output Summary (Last 24 hours) at 04/17/13 1046 Last data filed at 04/17/13 0952  Gross per 24 hour  Intake    960 ml  Output      0 ml  Net    960 ml   Filed Weights   04/15/13 0434 04/16/13 0300 04/17/13 0449  Weight: 91.8 kg (202 lb 6.1 oz) 92.2 kg (203 lb 4.2 oz) 92.4 kg (203 lb 11.3 oz)    Exam:   General:  Awake, in nad  Cardiovascular: regular, s1, s2  Respiratory: normal resp effort, no wheezing  Abdomen: soft, nondistended  Musculoskeletal: perfused, no clubbing   Data Reviewed: Basic Metabolic Panel:  Recent Labs Lab 04/13/13 1645 04/13/13 1653 04/14/13 0526 04/15/13 0535 04/15/13 1725 04/16/13 0806  NA 132* 132* 129* 129*  --  131*  K 3.5* 3.4* 4.5 3.9  --  3.6*  CL 91* 93* 89* 88*  --  90*  CO2 27  --  27 24  --  29  GLUCOSE 174* 171* 340* 356* 443* 261*  BUN 21 23 27* 36*  --  41*  CREATININE 1.76* 1.80* 1.87* 1.84*  --  1.85*  CALCIUM 9.1  --  8.6 8.7  --  8.4   Liver Function Tests:  Recent Labs Lab 04/15/13 0535  AST 31  ALT 13  ALKPHOS 51  BILITOT 0.5  PROT 6.2  ALBUMIN 2.9*   No results found for this  basename: LIPASE, AMYLASE,  in the last 168 hours No results found for this basename: AMMONIA,  in the last 168 hours CBC:  Recent Labs Lab 04/13/13 1410 04/13/13 1653 04/14/13 0526 04/15/13 0535  WBC 7.0  --  4.2 8.4  NEUTROABS 4.3  --   --  7.4  HGB 8.6* 8.8* 7.6* 7.6*  HCT 25.3* 26.0* 23.5* 21.8*  MCV 91.7  --  92.2 88.6  PLT 214  --  214 222   Cardiac Enzymes:  Recent Labs Lab 04/13/13 1645  TROPONINI <0.30   BNP (last 3 results)  Recent Labs  12/23/12 1320 03/07/13 1715 04/13/13 1645  PROBNP 7083.0* 1156.0* 13238.0*   CBG:  Recent Labs Lab 04/16/13 0726 04/16/13 1219 04/16/13 1742 04/16/13 2118 04/17/13 0727  GLUCAP 241* 263* 233* 213* 258*    No  results found for this or any previous visit (from the past 240 hour(s)).   Studies: Dg Chest Port 1 View  04/16/2013   CLINICAL DATA:  Respiratory failure  EXAM: PORTABLE CHEST - 1 VIEW  COMPARISON:  04/13/2013  FINDINGS: Increased interstitial edema with associated significant cardiomegaly is suggestive of congestive heart failure. No pleural fluid is identified.  IMPRESSION: Worsening interstitial edema.   Electronically Signed   By: Aletta Edouard M.D.   On: 04/16/2013 15:12    Scheduled Meds: . amLODipine  5 mg Oral Daily  . apixaban  5 mg Oral BID  . atorvastatin  10 mg Oral q1800  . calcium citrate  200 mg of elemental calcium Oral Daily  . cholecalciferol  3,000 Units Oral Daily  . furosemide  20 mg Intravenous Daily  . gabapentin  100 mg Oral BID AC  . insulin aspart  0-9 Units Subcutaneous TID WC  . insulin aspart  4 Units Subcutaneous TID WC  . insulin glargine  20 Units Subcutaneous QHS  . levofloxacin  750 mg Oral Q48H  . levothyroxine  125 mcg Oral QAC breakfast  . methylPREDNISolone (SOLU-MEDROL) injection  60 mg Intravenous Q12H  . mometasone-formoterol  2 puff Inhalation BID  . nebivolol  10 mg Oral Daily  . vitamin B-6  50 mg Oral Daily   Continuous Infusions:   Active Problems:   HYPERTENSION   Cryptogenic organizing pneumonia   Atrial fibrillation   Acute respiratory failure  Time spent: 67mn  CHIU, SRockwoodHospitalists Pager [phone] If 7PM-7AM, please contact night-coverage at [email or web address], password [email or web address] [facility]1/02/2014, 10:46 AM  LOS: 4 days

## 2013-04-18 ENCOUNTER — Inpatient Hospital Stay (HOSPITAL_COMMUNITY): Payer: Medicare Other

## 2013-04-18 DIAGNOSIS — N179 Acute kidney failure, unspecified: Secondary | ICD-10-CM | POA: Diagnosis not present

## 2013-04-18 DIAGNOSIS — M3130 Wegener's granulomatosis without renal involvement: Secondary | ICD-10-CM | POA: Diagnosis not present

## 2013-04-18 DIAGNOSIS — R0902 Hypoxemia: Secondary | ICD-10-CM | POA: Diagnosis not present

## 2013-04-18 DIAGNOSIS — R935 Abnormal findings on diagnostic imaging of other abdominal regions, including retroperitoneum: Secondary | ICD-10-CM | POA: Diagnosis not present

## 2013-04-18 DIAGNOSIS — I4891 Unspecified atrial fibrillation: Secondary | ICD-10-CM | POA: Diagnosis not present

## 2013-04-18 LAB — BASIC METABOLIC PANEL
BUN: 46 mg/dL — ABNORMAL HIGH (ref 6–23)
CALCIUM: 9.1 mg/dL (ref 8.4–10.5)
CO2: 28 meq/L (ref 19–32)
CREATININE: 1.65 mg/dL — AB (ref 0.50–1.10)
Chloride: 90 mEq/L — ABNORMAL LOW (ref 96–112)
GFR, EST AFRICAN AMERICAN: 33 mL/min — AB (ref >90)
GFR, EST NON AFRICAN AMERICAN: 29 mL/min — AB (ref >90)
Glucose, Bld: 267 mg/dL — ABNORMAL HIGH (ref 70–99)
Potassium: 3.9 mEq/L (ref 3.7–5.3)
SODIUM: 132 meq/L — AB (ref 137–147)

## 2013-04-18 LAB — GLUCOSE, CAPILLARY
GLUCOSE-CAPILLARY: 278 mg/dL — AB (ref 70–99)
GLUCOSE-CAPILLARY: 183 mg/dL — AB (ref 70–99)

## 2013-04-18 MED ORDER — PREDNISONE 20 MG PO TABS
40.0000 mg | ORAL_TABLET | Freq: Every day | ORAL | Status: DC
  Administered 2013-04-18: 40 mg via ORAL
  Filled 2013-04-18 (×2): qty 2

## 2013-04-18 MED ORDER — PREDNISONE 20 MG PO TABS: 40.0000 mg | ORAL_TABLET | Freq: Every day | ORAL | Status: DC

## 2013-04-18 MED ORDER — GADOBENATE DIMEGLUMINE 529 MG/ML IV SOLN
9.0000 mL | Freq: Once | INTRAVENOUS | Status: AC | PRN
  Administered 2013-04-18: 9 mL via INTRAVENOUS

## 2013-04-18 NOTE — Evaluation (Signed)
Physical Therapy Evaluation Patient Details Name: [name] [name] MRN: [id number] DOB: 1932-05-10 Today's Date: 04/18/2013 Time: 8657-8469 PT Time Calculation (min): 11 min  PT Assessment / Plan / Recommendation History of Present Illness  80 year old female with a history of Wegener's granulomatosis, chronic atrial fibrillation, BOOP,, metastatic intestinal carcinoid, diabetes mellitus, hypertension, and breast cancer in remission presents with 3 days of worsening shortness of breath. The patient complains of one-week duration of URI symptoms including increasing cough, watery eyes, runny nose, and low-grade fever for about one week. Today at home, the patient took her temperature, and it was 101.1F. The patient was recently started on Mucinex after which, the patient states that she has had increasing sputum production that is green in color. She has some nausea without any vomiting, diarrhea, abdominal pain, chest discomfort, hemoptysis. The patient has had hx of VATS bx revealing BOOP. She has been compliant with her Eliquis.   Clinical Impression  Pt admitted with acute respiratory failure. Pt currently with functional limitations due to the deficits listed below (see PT Problem List).  Pt will benefit from skilled PT to increase their independence and safety with mobility to allow discharge to the venue listed below.  Pt reports vertigo present since MRI this morning so very cautious with gait however able to ambulate without unsteadiness using RW.  Pt reports no hx of falls since first diagnosed with vertigo in 2007.  Discussed pt receiving valium (she states she takes for vertigo) prior to d/c to assist with getting home more comfortably.  Pt feels safe to d/c home and would like HHPT.  Will follow if remains in acute.     PT Assessment  Patient needs continued PT services    Follow Up Recommendations  Home health PT    Does the patient have the potential to tolerate intense  rehabilitation      Barriers to Discharge        Equipment Recommendations  None recommended by PT    Recommendations for Other Services     Frequency Min 3X/week    Precautions / Restrictions Precautions Precautions: Fall Precaution Comments: hx vertigo   Pertinent Vitals/Pain Only c/o vertigo (RN aware)      Mobility  Bed Mobility Overal bed mobility: Modified Independent Transfers Overall transfer level: Needs assistance Equipment used: Rolling walker (2 wheeled) Transfers: Sit to/from Stand Sit to Stand: Supervision General transfer comment: supervision for safety as pt reports her vertigo is "acting up" from MRI earlier, increased time to rise Ambulation/Gait Ambulation/Gait assistance: Min guard Ambulation Distance (Feet): 160 Feet Assistive device: Rolling walker (2 wheeled) Gait Pattern/deviations: Step-through pattern;Drifts right/left Gait velocity: decr General Gait Details: pt tends to stay to R inside RW and drift closer to wall, pt reports fear of falling and states she ambulates near walls when vertigo presents for safety, encouraged to ambulate 50 feet in middle of hallway which pt able to perform, increased time for safety per pt, states no hx of falls since 2007 when first dx with vertigo    Exercises     PT Diagnosis: Difficulty walking  PT Problem List: Decreased activity tolerance;Decreased balance;Decreased mobility;Decreased knowledge of use of DME PT Treatment Interventions: DME instruction;Gait training;Functional mobility training;Therapeutic exercise;Therapeutic activities;Patient/family education;Neuromuscular re-education;Balance training     PT Goals(Current goals can be found in the care plan section) Acute Rehab PT Goals Patient Stated Goal: would like to return home with HHPT PT Goal Formulation: With patient Time For Goal Achievement: 05/02/13 Potential to Achieve Goals:  Good  Visit Information  Last PT Received On:  04/18/13 Assistance Needed: +1 History of Present Illness: 80 year old female with a history of Wegener's granulomatosis, chronic atrial fibrillation, BOOP,, metastatic intestinal carcinoid, diabetes mellitus, hypertension, and breast cancer in remission presents with 3 days of worsening shortness of breath. The patient complains of one-week duration of URI symptoms including increasing cough, watery eyes, runny nose, and low-grade fever for about one week. Today at home, the patient took her temperature, and it was 101.21F. The patient was recently started on Mucinex after which, the patient states that she has had increasing sputum production that is green in color. She has some nausea without any vomiting, diarrhea, abdominal pain, chest discomfort, hemoptysis. The patient has had hx of VATS bx revealing BOOP. She has been compliant with her Eliquis.        Prior Functioning  Home Living Family/patient expects to be discharged to:: Private residence Living Arrangements: Spouse/significant other Type of Home: House Home Access: Stairs to enter [employer] of Steps: 1 Home Layout: One level Home Equipment: Environmental consultant - 2 wheels;Bedside commode;Cane - single point Prior Function Level of Independence: Independent with assistive device(s) Comments: pt reports RW in household and w/c for community Communication Communication: No difficulties    Cognition  Cognition Arousal/Alertness: Awake/alert Behavior During Therapy: WFL for tasks assessed/performed Overall Cognitive Status: Within Functional Limits for tasks assessed    Extremity/Trunk Assessment Lower Extremity Assessment Lower Extremity Assessment: Overall WFL for tasks assessed   Balance    End of Session PT - End of Session Activity Tolerance: Patient tolerated treatment well Patient left: in bed;with call bell/phone within reach;Other (comment);with nursing/sitter in room;with family/visitor present (sitting EOB with nsg  present) Nurse Communication: Mobility status  GP     Channel Papandrea,KATHrine E 04/18/2013, 1:03 PM Zenovia Jarred, PT, DPT 04/18/2013 Pager: [phone]

## 2013-04-18 NOTE — Discharge Summary (Signed)
Physician Discharge Summary  [name] [name] IWP:[id number] DOB: Oct 21, 1932 DOA: 04/13/2013  PCP: [name], MD  Admit date: 04/13/2013 Discharge date: 04/18/2013  Time spent: 35 minutes  Recommendations for Outpatient Follow-up:  1. Follow up with PCP in 1-2 weeks 2. Follow up with Dr. Annamaria Boots  Discharge Diagnoses:  Active Problems:   HYPERTENSION   Cryptogenic organizing pneumonia   Atrial fibrillation   Acute respiratory failure   Pancreatic cyst   Discharge Condition: Improved  Diet recommendation: Diabetic  Filed Weights   04/16/13 0300 04/17/13 0449 04/18/13 0530  Weight: 92.2 kg (203 lb 4.2 oz) 92.4 kg (203 lb 11.3 oz) 92.6 kg (204 lb 2.3 oz)    History of present illness:  80 year old female with a history of Wegener's granulomatosis, chronic atrial fibrillation, BOOP,, metastatic intestinal carcinoid, diabetes mellitus, hypertension, and breast cancer in remission presents with 3 days of worsening shortness of breath. The patient complains of one-week duration of URI symptoms including increasing cough, watery eyes, runny nose, and low-grade fever for about one week. Today at home, the patient took her temperature, and it was 101.51F. The patient was recently started on Mucinex after which, the patient states that she has had increasing sputum production that is green in color. She has some nausea without any vomiting, diarrhea, abdominal pain, chest discomfort, hemoptysis. The patient has had hx of VATS bx revealing BOOP. She has been compliant with her Eliquis.  She was admitted in July 2014 for hemoptysis and worsening renal function and ultimately dx with granulomatosis with polyangiitis (Wegener's). Chest CT demonstrated no pulmonary embolism but did demonstrate alveolar hemorrhage pattern. She was initially diuresed with Lasix but had worsening creatinine. Infiltrates were felt to less likely be related to edema and Lasix was stopped. Echo 11/02/12: EF 55-60%, trivial AI,  mod MR, mod to severe LAE, mild RVE, mod RAE, severe TR, severely increased PASP, oscillating density in RA (chiari network). Eliquis was held until her hemoptysis cleared and was then resumed.  She reports she went to the emergency room in September 2014. This is apparently for symptoms more sinusitis. The CT was unremarkable. Her chest x-ray showed some mild infiltrates. Her BNP level was elevated in the 7000 range. This is actually down from 22,000 in July. She was given a dose of IV Lasix.  In the ED, the patient was given furosemide 40 mg IV, prednisone 60 mg, as well as albuterol, and levofloxacin 57m x 1.  Hospital Course:  acute respiratory failure  -Likely exacerbation of granulomatous polyangiitis or exacerbation of BOOP, vs fluid overload and CHF  -CT chest with findings of patchy ground glass opacities in both lungs  -Started Solu-Medrol IV, symptoms had been improving however follow up CXR on 04/16/13 suggested worsening vascular congestion  - Lasix initially increased to IV with good results -Initially continued levofloxacin  -Influenza PCR neg-Continue long-acting beta agonist  -Given complexity of her pulmonary history as well as her recent hospitalization for Wegener's flare in 8/14, had consulted PCCM for further recommendations - Per Pulmonary, wean steroids to 153mdaily over one week.  Elevated proBNP  -BNP up to 13K from 1K in less than a month  -Repeat echocardiogram with normal EF  -Suspect a degree of pulmonary hypertension possibly right heart failure  -Continue lasix on discharge Hypertension  -Continued amlodipine and bystolic  Hypothyroidism  -Continue Synthroid  Breast cancer  -In remission  -follows Dr. MaJana HakimAtrial fibrillation  -continue Eliquis, no active bleed  -continue BB  Hyperlipidemia  -  Continue statin  CKD stage III  -Baseline creatinine 1.5-1.8  Diabetes mellitus type 2  -Continue home dose of Lantus  -NovoLog sliding scale   Pancreatic cyst  - This was a new incidental finding  - Follow up MRI was ordered, pending  Consultations:  Pulmonary  Discharge Exam: Filed Vitals:   04/17/13 2123 04/18/13 0530 04/18/13 0840 04/18/13 1428  BP: 148/75 164/96  162/102  Pulse: 96 95  86  Temp: 97.7 F (36.5 C) 97.9 F (36.6 C)  98.3 F (36.8 C)  TempSrc: Oral Oral  Oral  Resp: 18 18    Height:      Weight:  92.6 kg (204 lb 2.3 oz)    SpO2: 96% 95% 93% 97%    General: Awake, in nad Cardiovascular: regular, s1, s2 Respiratory: normal resp effort, no wheezing  Discharge Instructions       Future Appointments Provider Department Dept Phone   04/22/2013 2:30 PM Deneise Lever, MD [facility] [phone]   04/28/2013 10:00 AM [name], MD [facility]   06/20/2013 9:00 AM [facility]  [facility] [phone]   Please wear two piece clothing and wear no powder or deodorant. Please arrive 15 minutes early prior to your appointment time.   07/19/2013 10:30 AM Deneise Lever, MD [facility] [phone]   09/12/2013 10:00 AM [facility] [place] [facility] [phone]   09/12/2013 11:00 AM [facility] [phone]   Liquids only 4 hours prior to your exam. Any medications can be taken as usual. Please arrive 15 min prior to your scheduled exam time.   09/15/2013 9:00 AM Chauncey Cruel, [place] [phone]   02/09/2014 10:15 AM [facility] [place] [facility] [phone]   02/09/2014 10:45 AM Amy Milda Smart, PA-C [facility] [phone]       Medication List    ASK your doctor about these medications       apixaban 5 MG Tabs tablet  Commonly known as:  ELIQUIS  Take 1 tablet (5 mg total) by mouth 2 (two) times daily.     AZOR 5-40 MG per tablet   Generic drug:  amLODipine-olmesartan  Take 1 tablet by mouth daily.     CALCIUM CITRATE PO  Take 1,200 mg by mouth daily.     cholecalciferol 1000 UNITS tablet  Commonly known as:  VITAMIN D  Take 3,000 Units by mouth daily.     diazepam 5 MG tablet  Commonly known as:  VALIUM  Take 5 mg by mouth daily as needed (vertigo.).     furosemide 20 MG tablet  Commonly known as:  LASIX  Take 1 tablet (20 mg total) by mouth daily.     gabapentin 100 MG capsule  Commonly known as:  NEURONTIN  Take 1 capsule (100 mg total) by mouth 2 (two) times daily before a meal.     Insulin Glargine 100 UNIT/ML Solostar Pen  Commonly known as:  LANTUS SOLOSTAR  Inject 10 Units into the skin at bedtime.     levothyroxine 125 MCG tablet  Commonly known as:  SYNTHROID, LEVOTHROID  Take 125 mcg by mouth daily before breakfast.     mometasone-formoterol 100-5 MCG/ACT Aero  Commonly known as:  DULERA  2 puffs then rinse mouth, twice daily     nebivolol 10  MG tablet  Commonly known as:  BYSTOLIC  Take 10 mg by mouth daily.     predniSONE 10 MG tablet  Commonly known as:  DELTASONE  Take 10 mg by mouth daily with breakfast.     rosuvastatin 5 MG tablet  Commonly known as:  CRESTOR  Take 1 tablet (5 mg total) by mouth daily.     sulfamethoxazole-trimethoprim 800-160 MG per tablet  Commonly known as:  BACTRIM DS  Take 1 tablet by mouth every Monday, Wednesday, and Friday.     VITAMIN B-6 PO  Take 100 mg by mouth daily.       Allergies  Allergen Reactions  . Codeine Sulfate     REACTION: GI upset  . Sulfonamide Derivatives     REACTION: GI upset  . Atarax [Hydroxyzine] Nausea Only and Rash      The results of significant diagnostics from this hospitalization (including imaging, microbiology, ancillary and laboratory) are listed below for reference.    Significant Diagnostic Studies: Dg Chest 2 View  04/13/2013   CLINICAL DATA:  Productive cough and shortness of breath for 2 days.   EXAM: CHEST  2 VIEW  COMPARISON:  PA and lateral chest 12/23/2012.  FINDINGS: There is pulmonary vascular congestion and marked cardiomegaly, unchanged. No consolidative process, pneumothorax or effusion.  IMPRESSION: Cardiomegaly and vascular congestion.  No acute process.   Electronically Signed   By: Inge Rise M.D.   On: 04/13/2013 14:45   Ct Chest Wo Contrast  04/14/2013   CLINICAL DATA:  Wegener's granulomatosis with abnormal chest x-ray. Cough.  EXAM: CT CHEST WITHOUT CONTRAST  TECHNIQUE: Multidetector CT imaging of the chest was performed following the standard protocol without IV contrast.  COMPARISON:  Chest CT 11/01/2012.  FINDINGS: The chest wall is unremarkable and stable. No breast masses, supraclavicular or axillary adenopathy. Small scattered lymph nodes are noted.  The bony thorax is intact. No destructive bone lesions or spinal canal compromise. Moderate osteoporosis.  The heart is enlarged but stable. Three-vessel coronary artery calcifications are noted. No mediastinal or hilar mass or adenopathy. The pulmonary arteries are mildly enlarged. The aorta is normal in caliber. Mild tortuosity and calcification. The esophagus is grossly normal. There is a small hiatal hernia.  Examination of the lung parenchyma demonstrates patchy D ground-glass opacity and the age nodularity. This suggests a partial airspace filling process and could be pulmonary edema, pulmonary hemorrhage or infectious or inflammatory alveolitis. No focal airspace consolidation to suggest pneumonia. No worrisome pulmonary lesions. No pleural effusion. No bronchiectasis. There is a calcified granuloma noted in the right lower lobe.  The upper abdomen is grossly normal. There appears to be a the new cystic area in the pancreatic tail/ body junction region. This is incompletely evaluated. A followup CT scan with contrast (if possible) using pancreatic protocol is suggested. If the patient cannot have IV contrast an MRI of the  abdomen without contrast may be more helpful.  IMPRESSION: 1. Stable cardiac enlargement and coronary artery calcifications. 2. Patchy ground-glass opacity in both lungs with vague nodularity. Findings suggest a partial airspace filling process such as edema, hemorrhage or inflammatory or infectious alveolitis. No focal airspace consolidation. 3. Possible new cystic lesion in the pancreatic body/tail junction region. Please see above discussion.   Electronically Signed   By: Kalman Jewels M.D.   On: 04/14/2013 10:56   Dg Chest Port 1 View  04/16/2013   CLINICAL DATA:  Respiratory failure  EXAM: PORTABLE CHEST - 1 VIEW  COMPARISON:  04/13/2013  FINDINGS: Increased interstitial edema with associated significant cardiomegaly is suggestive of congestive heart failure. No pleural fluid is identified.  IMPRESSION: Worsening interstitial edema.   Electronically Signed   By: Aletta Edouard M.D.   On: 04/16/2013 15:12    Microbiology: No results found for this or any previous visit (from the past 240 hour(s)).   Labs: Basic Metabolic Panel:  Recent Labs Lab 04/13/13 1645 04/13/13 1653 04/14/13 0526 04/15/13 0535 04/15/13 1725 04/16/13 0806 04/18/13 0807  NA 132* 132* 129* 129*  --  131* 132*  K 3.5* 3.4* 4.5 3.9  --  3.6* 3.9  CL 91* 93* 89* 88*  --  90* 90*  CO2 27  --  27 24  --  29 28  GLUCOSE 174* 171* 340* 356* 443* 261* 267*  BUN 21 23 27* 36*  --  41* 46*  CREATININE 1.76* 1.80* 1.87* 1.84*  --  1.85* 1.65*  CALCIUM 9.1  --  8.6 8.7  --  8.4 9.1   Liver Function Tests:  Recent Labs Lab 04/15/13 0535  AST 31  ALT 13  ALKPHOS 51  BILITOT 0.5  PROT 6.2  ALBUMIN 2.9*   No results found for this basename: LIPASE, AMYLASE,  in the last 168 hours No results found for this basename: AMMONIA,  in the last 168 hours CBC:  Recent Labs Lab 04/13/13 1410 04/13/13 1653 04/14/13 0526 04/15/13 0535  WBC 7.0  --  4.2 8.4  NEUTROABS 4.3  --   --  7.4  HGB 8.6* 8.8* 7.6* 7.6*   HCT 25.3* 26.0* 23.5* 21.8*  MCV 91.7  --  92.2 88.6  PLT 214  --  214 222   Cardiac Enzymes:  Recent Labs Lab 04/13/13 1645  TROPONINI <0.30   BNP: BNP (last 3 results)  Recent Labs  12/23/12 1320 03/07/13 1715 04/13/13 1645  PROBNP 7083.0* 1156.0* 13238.0*   CBG:  Recent Labs Lab 04/17/13 1201 04/17/13 1719 04/17/13 2116 04/18/13 0737 04/18/13 1157  GLUCAP 273* 310* 312* 278* 183*    Signed:  CHIU, STEPHEN K  [facility] 04/18/2013, 2:41 PM

## 2013-04-18 NOTE — Evaluation (Signed)
Occupational Therapy Evaluation Patient Details Name: [name] [name] MRN: [id number] DOB: 09/14/1932 Today's Date: 04/18/2013 Time: 5573-2202 OT Time Calculation (min): 21 min  OT Assessment / Plan / Recommendation History of present illness 80 year old female with a history of Wegener's granulomatosis, chronic atrial fibrillation, BOOP,, metastatic intestinal carcinoid, diabetes mellitus, hypertension, and breast cancer in remission presents with 3 days of worsening shortness of breath. The patient complains of one-week duration of URI symptoms including increasing cough, watery eyes, runny nose, and low-grade fever for about one week. Today at home, the patient took her temperature, and it was 101.32F. The patient was recently started on Mucinex after which, the patient states that she has had increasing sputum production that is green in color. She has some nausea without any vomiting, diarrhea, abdominal pain, chest discomfort, hemoptysis. The patient has had hx of VATS bx revealing BOOP. She has been compliant with her Eliquis.    Clinical Impression   Pt states family can help with ADL at home. She may d/c later today. Recommended HHOT but pt declines stating she feels she will be fine without it. Husband present for session. She will need 3in1 and recommended initial assist at home.    OT Assessment  Patient needs continued OT Services    Follow Up Recommendations  No OT follow up;Supervision/Assistance - 24 hour (pt declines HHOT though feel she could benefit)    Barriers to Discharge      Equipment Recommendations  3 in 1 bedside comode    Recommendations for Other Services    Frequency  Min 2X/week    Precautions / Restrictions Precautions Precautions: Fall Precaution Comments: hx vertigo   Pertinent Vitals/Pain No complaint of    ADL  Eating/Feeding: Simulated;Independent Where Assessed - Eating/Feeding: Edge of bed Grooming: Performed;Wash/dry hands;Min guard Where  Assessed - Grooming: Unsupported standing Upper Body Bathing: Simulated;Chest;Right arm;Left arm;Abdomen;Set up Where Assessed - Upper Body Bathing: Unsupported sitting Lower Body Bathing: Simulated;Min guard Where Assessed - Lower Body Bathing: Supported sit to stand Upper Body Dressing: Simulated;Set up Where Assessed - Upper Body Dressing: Unsupported sitting Lower Body Dressing: Simulated;Min guard Where Assessed - Lower Body Dressing: Supported sit to stand Toilet Transfer: Performed;Minimal assistance Toilet Transfer Method: Sit to Loss adjuster, chartered: Comfort height toilet;Grab bars Toileting - Water quality scientist and Hygiene: Simulated;Minimal assistance Where Assessed - Best boy and Hygiene: Sit to stand from 3-in-1 or toilet Equipment Used: Rolling walker ADL Comments: Pt states at home she uses grab bar on one side of toilet to pull up with both UEs (torso turned to bar) and pt attempted to pull up with both UEs on bar today but having difficulty. She needed min assist to rise from toilet after several attempts on her own. Discussed obtaining a 3in1 for safety so she has bilateral UE support and push up without having to twist torso and reach for bar with both UEs. Pt agreeable to ordering 3in1 for her. She declines needing to practice shower/tub transfer and states her husband can hep her in and out. She has grab bars and a seat. Advised her to have initial assist for showering. She states family can also hep with household tasks and meal prep. Pt states she feels a little "off" with her balance today due to being in MRI test.    OT Diagnosis: Generalized weakness  OT Problem List: Decreased strength;Decreased knowledge of use of DME or AE OT Treatment Interventions: Self-care/ADL training;DME and/or AE instruction;Therapeutic activities;Patient/family education   OT  Goals(Current goals can be found in the care plan section) Acute Rehab OT  Goals Patient Stated Goal: return home OT Goal Formulation: With patient/family Time For Goal Achievement: 05/02/13 Potential to Achieve Goals: Good  Visit Information  Last OT Received On: 04/18/13 Assistance Needed: +1 Reason Eval/Treat Not Completed: Other (comment) History of Present Illness: 80 year old female with a history of Wegener's granulomatosis, chronic atrial fibrillation, BOOP,, metastatic intestinal carcinoid, diabetes mellitus, hypertension, and breast cancer in remission presents with 3 days of worsening shortness of breath. The patient complains of one-week duration of URI symptoms including increasing cough, watery eyes, runny nose, and low-grade fever for about one week. Today at home, the patient took her temperature, and it was 101.66F. The patient was recently started on Mucinex after which, the patient states that she has had increasing sputum production that is green in color. She has some nausea without any vomiting, diarrhea, abdominal pain, chest discomfort, hemoptysis. The patient has had hx of VATS bx revealing BOOP. She has been compliant with her Eliquis.        Prior [place] expects to be discharged to:: Private residence Living Arrangements: Spouse/significant other Type of Home: House Home Access: Stairs to enter [employer] of Steps: 1 Home Layout: One level Home Equipment: Environmental consultant - 2 wheels;Cane - single point;Grab bars - toilet;Grab bars - tub/shower;Shower seat Prior Function Level of Independence: Independent with assistive device(s) Comments: pt reports RW in household and w/c for community Communication Communication: No difficulties         Vision/Perception     Cognition  Cognition Arousal/Alertness: Awake/alert Behavior During Therapy: WFL for tasks assessed/performed Overall Cognitive Status: Within Functional Limits for tasks assessed    Extremity/Trunk Assessment Upper Extremity  Assessment Upper Extremity Assessment: Overall WFL for tasks assessed Lower Extremity Assessment Lower Extremity Assessment: Overall WFL for tasks assessed     Mobility Bed Mobility Overal bed mobility: Modified Independent Transfers Overall transfer level: Needs assistance Equipment used: Rolling walker (2 wheeled) Transfers: Sit to/from Stand Sit to Stand: Min assist General transfer comment: min assist from lower toilet surface with grab bar use. Min guard from bed. several attempts to stand from toilet on her own but unable. pt states she is fatigued today.     Exercise     Balance     End of Session OT - End of Session Equipment Utilized During Treatment: Rolling walker Activity Tolerance: Patient limited by fatigue (pt states some fatigue today) Patient left: in chair;with call bell/phone within reach;with family/visitor present  Cornelius, [place] [phone] 04/18/2013, 1:17 PM

## 2013-04-18 NOTE — Consult Note (Signed)
Name: [name] [name] MRN: [id number] DOB: 17-Dec-1932    ADMISSION DATE:  04/13/2013 CONSULTATION DATE:  04/17/13  REFERRING MD :  Wyline Copas PRIMARY SERVICE:  TRH  CHIEF COMPLAINT: Short of breath  BRIEF PATIENT DESCRIPTION: 79 yoF former smoker followed at [facility] for hx BOOP/ chronic organizing pneumonia by lung bx, C-ANCA + c/w Wegener's lung/ kidney. Now presented with URI, T 101, progressive dyspnea. Pulmonary consulted for assessment.  SIGNIFICANT EVENTS / STUDIES:  CT chest  LINES / TUBES: PIV  CULTURES:   ANTIBIOTICS: Levaquin 04/15/13>>  HISTORY OF PRESENT ILLNESS:  As Per admitting HPI: 80 year old female with a history of Wegener's granulomatosis, chronic atrial fibrillation, BOOP,, metastatic intestinal carcinoid, diabetes mellitus, hypertension, and breast cancer in remission presents with 3 days of worsening shortness of breath. The patient complains of one-week duration of URI symptoms including increasing cough, watery eyes, runny nose, and low-grade fever for about one week. Today at home, the patient took her temperature, and it was 101.82F. The patient was recently started on Mucinex after which, the patient states that she has had increasing sputum production that is green in color. She has some nausea without any vomiting, diarrhea, abdominal pain, chest discomfort, hemoptysis. The patient has had hx of VATS bx revealing BOOP. She has been compliant with her Eliquis.  She was admitted in July 2014 for hemoptysis and worsening renal function and ultimately dx with granulomatosis with polyangiitis (Wegener's). Chest CT demonstrated no pulmonary embolism but did demonstrate alveolar hemorrhage pattern. She was initially diuresed with Lasix but had worsening creatinine. Infiltrates were felt to less likely be related to edema and Lasix was stopped. Echo 11/02/12: EF 55-60%, trivial AI, mod MR, mod to severe LAE, mild RVE, mod RAE, severe TR, severely increased PASP,  oscillating density in RA (chiari network). Eliquis was held until her hemoptysis cleared and was then resumed.  She reports she went to the emergency room in September 2014. This is apparently for symptoms more sinusitis. The CT was unremarkable. Her chest x-ray showed some mild infiltrates. Her BNP level was elevated in the 7000 range. This is actually down from 22,000 in July. She was given a dose of IV Lasix. I reviewed these points with her and family. Husband has some dementia, but son concurred.     SUBJECTIVE: Afebrile, although appears warm No dyspnea. hematuria  VITAL SIGNS: Temp:  [97.7 F (36.5 C)-98.4 F (36.9 C)] 97.9 F (36.6 C) (01/12 0530) Pulse Rate:  [95-107] 95 (01/12 0530) Resp:  [18] 18 (01/12 0530) BP: (132-164)/(69-96) 164/96 mmHg (01/12 0530) SpO2:  [93 %-96 %] 93 % (01/12 0840) Weight:  [92.6 kg (204 lb 2.3 oz)] 92.6 kg (204 lb 2.3 oz) (01/12 0530)  PHYSICAL EXAMINATION: General:  Alert, pleasant, up in bed Neuro:  Moves all extremities, non-focal, oriented HEENT: mucosa moist, gross hearing and vision intact Neck:  No stridor or bruit Cardiovascular: IRR, no m/g/r, no periph edema Lungs:  Diminished.  no cough, unlabored Abdomen:soft, BS+, no mass Musculoskeletal: symmetrical Skin:  ? Bruise lateral calf   Recent Labs Lab 04/15/13 0535 04/15/13 1725 04/16/13 0806 04/18/13 0807  NA 129*  --  131* 132*  K 3.9  --  3.6* 3.9  CL 88*  --  90* 90*  CO2 24  --  29 28  BUN 36*  --  41* 46*  CREATININE 1.84*  --  1.85* 1.65*  GLUCOSE 356* 443* 261* 267*    Recent Labs Lab 04/13/13 1410 04/13/13 1653  04/14/13 0526 04/15/13 0535  HGB 8.6* 8.8* 7.6* 7.6*  HCT 25.3* 26.0* 23.5* 21.8*  WBC 7.0  --  4.2 8.4  PLT 214  --  214 222   Dg Chest Port 1 View  04/16/2013   CLINICAL DATA:  Respiratory failure  EXAM: PORTABLE CHEST - 1 VIEW  COMPARISON:  04/13/2013  FINDINGS: Increased interstitial edema with associated significant cardiomegaly is  suggestive of congestive heart failure. No pleural fluid is identified.  IMPRESSION: Worsening interstitial edema.   Electronically Signed   By: Aletta Edouard M.D.   On: 04/16/2013 15:12    ASSESSMENT / PLAN: 1) Acute illness, initially febrile- likely a superimposed respiratory viral illness. Negative flu assay does not rule this out, given the widespread flu outbreak currently.  P- treat supportively  2) Hx COP/BOOP by lung bx- doubt acute exacerbation now. Steroids would be therapeutic anyway.ct dulera  3) Granulomatous polyangiitis-lung/ kidney- Wegener's. Previously Rx'd. She had areas of ground-glass noted on CT 08/02/12. Possibly that was an early manifestation of current process. She has tended to respond to steroids. GGO slight worse, but clinically ok  4) Metastatic carcinoid to lung- incidental finding on lung bx done to dx BOOP. No primary identified. No recurrence.  5) Breast Ca 2006- in remission  Recommend- Manage for now as viral pneumonitis with complicated background hx. Taper back to outpt dose of 10 mg over 1 week.  PCCM to sign off Can FU outpt with Dr Verdis Frederickson V.  [phone]  04/18/2013, 9:06 AM

## 2013-04-18 NOTE — Progress Notes (Signed)
OT Cancellation Note  Patient Details Name: [name] [name] MRN: [id number] DOB: [birth year]   Cancelled Treatment:    Reason Eval/Treat Not Completed: Other (comment) Came by earlier and pt on her way to MRI. Will try back later as schedule allows.  Jules Schick [phone] 04/18/2013, 11:21 AM

## 2013-04-18 NOTE — Progress Notes (Signed)
Patient will have MRI at 10:30 per Marjory Lies, MRI.

## 2013-04-19 ENCOUNTER — Telehealth: Payer: Self-pay | Admitting: Internal Medicine

## 2013-04-20 DIAGNOSIS — H8110 Benign paroxysmal vertigo, unspecified ear: Secondary | ICD-10-CM | POA: Diagnosis not present

## 2013-04-20 DIAGNOSIS — M6281 Muscle weakness (generalized): Secondary | ICD-10-CM | POA: Diagnosis not present

## 2013-04-20 DIAGNOSIS — R269 Unspecified abnormalities of gait and mobility: Secondary | ICD-10-CM | POA: Diagnosis not present

## 2013-04-20 DIAGNOSIS — J8409 Other alveolar and parieto-alveolar conditions: Secondary | ICD-10-CM | POA: Diagnosis not present

## 2013-04-20 DIAGNOSIS — I509 Heart failure, unspecified: Secondary | ICD-10-CM | POA: Diagnosis not present

## 2013-04-20 DIAGNOSIS — M3130 Wegener's granulomatosis without renal involvement: Secondary | ICD-10-CM | POA: Diagnosis not present

## 2013-04-20 NOTE — Telephone Encounter (Signed)
Attempted call back _0 :00 04/20/13- line busy. JK/CAN

## 2013-04-20 NOTE — Telephone Encounter (Signed)
Pt states at time of discharge they advised her to stop gabapentin but it is still on her med list so she wants to know should she take it. Per chart this is followed by her PCP so pt advised to contact them. Dubach Bing, CMA

## 2013-04-21 ENCOUNTER — Telehealth: Payer: Self-pay | Admitting: Family Medicine

## 2013-04-21 NOTE — Telephone Encounter (Signed)
She is at increased risk of thrush (steroids).  Nystatin oral suspension 5 ml -one half (2.5 ml) to each side of mouth QID. Disp 240 ml.

## 2013-04-21 NOTE — Telephone Encounter (Signed)
Does she have any white patches inside her cheeks (ie buccal mucosa) or posterior pharynx?  If only tongue not as likely thrush

## 2013-04-21 NOTE — Telephone Encounter (Signed)
Pt was seen in ED on 04/13/13

## 2013-04-21 NOTE — Telephone Encounter (Signed)
Mainly on the tongue. Couple of white dots on the side of mouth. At times a little tingling. But other than that she feels fine.

## 2013-04-21 NOTE — Telephone Encounter (Signed)
Pt woke up and her tongue is white. [facility] [address]

## 2013-04-22 ENCOUNTER — Encounter: Payer: Self-pay | Admitting: Internal Medicine

## 2013-04-22 ENCOUNTER — Ambulatory Visit (INDEPENDENT_AMBULATORY_CARE_PROVIDER_SITE_OTHER): Payer: Medicare Other | Admitting: Internal Medicine

## 2013-04-22 VITALS — BP 138/70 | HR 79 | Ht 65.5 in | Wt 200.0 lb

## 2013-04-22 DIAGNOSIS — J441 Chronic obstructive pulmonary disease with (acute) exacerbation: Secondary | ICD-10-CM | POA: Diagnosis not present

## 2013-04-22 DIAGNOSIS — I5033 Acute on chronic diastolic (congestive) heart failure: Secondary | ICD-10-CM

## 2013-04-22 DIAGNOSIS — R609 Edema, unspecified: Secondary | ICD-10-CM | POA: Diagnosis not present

## 2013-04-22 DIAGNOSIS — M3130 Wegener's granulomatosis without renal involvement: Secondary | ICD-10-CM

## 2013-04-22 MED ORDER — NYSTATIN 100000 UNIT/ML MT SUSP: OROMUCOSAL | Status: DC

## 2013-04-22 NOTE — Progress Notes (Signed)
04/03/11- 80 year old female former smoking history referred courtesy of Dr. Elease Hashimoto with concern of abnormal chest CT. Husband is an allergy patient here. Dr. Erick Blinks recent office note reviewed "Followup from recent pneumonia. Patient had presented here with extreme myalgias and chronic pain along with bilateral shoulder hip pain. Very high sedimentation rate of 120.  She almost total resolution of myalgias with low dose prednisone strongly suggesting polymyalgia rheumatica. She subsequently presented with cough with trace of blood but no dyspnea, fever, reported a pain. Chest x-ray showed bilateral infiltrates suggesting probable bilateral pneumonia the recommendation for CT scan. CT scan revealed no adenopathy and no evidence for a lung mass. Compatible with bilateral pneumonia. Patient placed on antibiotic and much better at this time with no fever and essentially no cough. Her muscle and joint pains are almost 100% resolved with low-dose prednisone. She did present back in September with severe sinus disease suggesting acute sinusitis. CT revealed no acute findings. She denies any arthralgias at this time. No skin rash. No fever." She has been on prednisone 10 mg twice daily since December 13 and feels significantly better. Morning cough has cleared with residual trace phlegm and trace blood. No chest pain. CT chest 03/23/2011 -images reviewed by me-bilateral patchy airspace disease with air bronchograms. No cavitation or definite nodularity seen. She has had right total knee replacement but no generalized arthritis. Childhood exposure to an uncle with tuberculosis. Her PPD skin test was negative. She denies history of lung disease, GERD, DVT, kidney or liver disease. Lab- C-ANCA POS 1:320, P-ANCA NEG. RA 1:23, ANA NEG11 year old female former smoking history referred courtesy of Dr. Elease Hashimoto with concern of abnormal chest CT. Husband is an allergy patient here.  05/13/11- 52 yoF former smoker  followed for bilateral pulmonary infiltrates, incr C-ANCA, s/p VATS bx/ organizing pneumonia, Metastatic intestinal carcinoid. Marland Kitchen Hx breast Ca 2006/ Dr Truddie Coco Husband here. She returns now after left lung biopsy. She has expected left thoracotomy pain but is otherwise stable as she continues maintenance prednisone. Denies blood, fever, swollen glands, discolored sputum. Exertional dyspnea is not worse, allowing for the surgery. I shared with her and her husband the available initial pathology: Patchy organizing pneumonia with chronic interstitial pneumonia and focal atypical glands. I reviewed the discussion about her at thoracic oncology conference. The parenchymal lung process is not a vasculitis as I originally questioned. I told her there was an incidental finding of cancer cells with further identification pending but that this was a separate process. We agreed to refer her to Dr. Truddie Coco who managed her breast cancer. After she had left, personal communication from Dr. Jimmy Footman- neuroendocrine tumor staining consistent with metastatic intestinal carcinoid.  06/20/11  11 yoF former smoker followed for bilateral pulmonary infiltrates, incr C-ANCA, s/p VATS bx/ organizing pneumonia, Metastatic intestinal carcinoid. Marland Kitchen Hx breast Ca 2006/ Dr Truddie Coco   Husband here She says her breathing is now "fine" back to her normal. She denies fever, cough, sputum. I spoke with Mrs. Roehrs myself and had to explain the discovery of carcinoid after her lung biopsy. We also have the note from the [facility] indicating they discussed it with her. She has absolutely no recollection. Little cough or shortness of breath now. Still a little posterior thoracotomy incision pain. CT 05/19/11- images reviewed with them- IMPRESSION:  1. Marked improvement in bilateral air space disease seen on  comparison CT of 03/24/2011. Near complete resolution.  2. Postoperative change in the left hemithorax consistent with  recent VATS.   Original Report  Authenticated By: Suzy Bouchard, M.D.   11/07/11- 57 yoF former smoker followed for bilateral pulmonary infiltrates, incr C-ANCA, s/p VATS bx/ organizing pneumonia, Metastatic intestinal carcinoid. Marland Kitchen Hx breast Ca 2006/ Dr Truddie Coco    c/o  Chronic vertigo, difficulty breathing and gets tired easy. Also some wheezing, post nasal,  and cough.  Some days she feels much better than others, no real pattern. Occasional light cough to clear throat. We had called in prednisone, which did help. Ambulatory around home, but gets out little. CT chest 09/09/11- I reviewed w/ her- there is significant patchy bilateral lower zone fibrotic scarring and some bronchial thickening. IMPRESSION:  1. No evidence of thoracic metastatic disease or other active  process.  2. Small mild cardiomegaly and hiatal hernia.  Original Report Authenticated By: Marlaine Hind, M.D.    02/09/12-  27 yoF former smoker followed for bilateral pulmonary infiltrates, incr C-ANCA, s/p VATS bx/ cryptogenic organizing pneumonia/ BOOP, Metastatic intestinal carcinoid. Marland Kitchen Hx breast Ca 2006/ Dr Truddie Coco  January had biopsy to ck for other lung condition. SOB and wheezing at times. Left sided pain around the  Back. Continues Advair 250. Due for followup chest CT in December for Dr. Jacquiline Doe. Not on prednisone. She says breathing is unchanged. Denies pain, blood or swollen nodes.   05/11/12-  89 yoF former smoker followed for bilateral pulmonary infiltrates, incr C-ANCA, s/p VATS bx/ cryptogenic organizing pneumonia/ BOOP, Metastatic intestinal carcinoid. Complicated by Hx breast Ca 2006/ Dr Truddie Coco. AFib/ Dr Martinique, DM. She says her husband is developing dementia. Deep breath makes her left flank feel tight where she had thoracentesis. Complains of itching since starting Eliquis.Atarax 50 mg was too strong and Benadryl did not help. Itching keeps her awake all night. No visible rash. She is convinced really unpleasant itching began when  she started Eliquis. I explained this medication needs to be transitioned carefully to anything else and Dr. Martinique would need to be the one to manage that. CT chest 03/26/12 . IMPRESSION:  1. No findings to strongly suggest metastatic disease to the  thorax.  2. There is a new focus of nodular ground-glass attenuation  measuring 1 cm in the anterior aspect of the left upper lobe which  is highly nonspecific. Initial follow-up by chest CT without  contrast is recommended in 3 months to confirm persistence. This  recommendation follows the consensus statement: Recommendations for  the Management of Subsolid Pulmonary Nodules Detected at CT: A  Statement from the Fleischner Society as published in Radiology  2013; 266:304-317.  3. Atherosclerosis, including left main and three-vessel coronary  artery disease. Assessment for potential risk factor  modification, dietary therapy or pharmacologic therapy may be  warranted, if clinically indicated.  4. Slight increase in the small amount of pericardial fluid  compared to the prior examination. This is unlikely to be of  hemodynamic significance at this time.  5. Moderate cardiomegaly.  6. Additional incidental findings, similar to prior studies, as  discussed above.  Original Report Authenticated By: Vinnie Langton, M.D.   08/08/12- 15 yoF former smoker followed for bilateral pulmonary infiltrates/ nodules, incr C-ANCA, s/p VATS bx/ cryptogenic organizing pneumonia/ BOOP, Metastatic intestinal carcinoid. Complicated by Hx breast Ca 2006/ Dr Truddie Coco. AFib/ Dr Martinique, DM. Itching stopped, while continuing Eliquis. She had changed detergents and fabric softener, but cause unknown. Seasonal rhinitis symptoms now. Total IgE 05/11/2012 was less than 1.5. New burning itch, intermittent, right lateral ribs x2 weeks. Has had shingles shot. No visible rash. CT chest  08/02/12 IMPRESSION:  Interval persistence but decrease in size of the ground-glass   nodules seen in the anterior left upper lobe. Adenocarcinoma  cannot be excluded. Followup by CT is recommended in 12 months,  with continued annual surveillance for a minimum of 3 years.  These recommendations are taken from "Recommendations for the  Management of Subsolid Pulmonary Nodules Detected at CT: A  Statement from the [place]" Radiology 2013; 266:1, 304-  317.  Original Report Authenticated By: Misty Stanley, M.D.   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 Relapse since 2013 11/02/12 PR-3 880. MPO negative. - GBM neg Likely - capillaritis. 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 Admitted with hemoptysis  hemoptysis with CT scan chest findings of alveolar hemorrhage  As of June 2014 the oncologist did not feel there was any evidence of breast cancer recurrence.CT scan of the chest ruled out pulmonary embolism but showed severe bilateral groundglass opacities consistent with alveolar hemorrhage pattern. Pulmonary consultation was called. She was initially treated by stopping eliquis, providing empiric antibiotics and supported with diuresis.  Pulmonary  felt The clinical picture was felt to be consistent with diffuse alveolar hemorrhage resulting in hemoptysis. Most likely cause of alveolar hemorrhage was felt to be autoimmune based on past history of autoimmune antibody positivity and lung biopsy showing organizing pneumonia in December 2012/January 2013. Prior C.-ANCA positive though no evidence of vasculitis in Jan 2013 lung bx which is puzzling. Alternative is relapsing BOOP but the hemoptysis would be unsual unless precipitated by Eliquis. On 11/02/12 PR-3 880. Nephrology consult  On  7/29 immunosuppression therapy was initiated. She was given Pulse methylprednisolone, for three days, and her first dose of Rituxan was given on 7/30. The following days we saw significant clinical improvement in regards to hemoptysis, pulmonary infiltrates and dyspnea, She was started on  Rituxan over cyclophosphamide due to malignancy history, and also this is technically a "relapse" or A "flare". Dose starting 11/03/12 will be Induction therapy with rituximab (375 mg/m2 per week for four weeks). W/  PCP prophylaxis w/ - Bactrim 1 DS on M, W, F  Since discharge she is feeling better with resolution of hemoptysis, . Dyspnea is less and she is starting to get some of her energy back.  No fever , orthopnea or edema.  Had labs done last week. , hbg stable at 8.9 , renal fx tr down at 1.6 (max scr 2.4) . Ov with oncology last week. Rituxan  infusion given.   12/03/12- 85 yoF former smoker followed for bilateral pulmonary infiltrates/ nodules, incr C-ANCA, s/p VATS bx/ Granulomatous Polyangiitis (Wegener's) vs cryptogenic organizing pneumonia/ BOOP, Metastatic intestinal carcinoid. Complicated by Hx breast Ca 2006/ Dr Truddie Coco. AFib/ Dr Martinique, DM. Started Rituxan 375 mg/m2x 4 weeks 11/03/12. PCP prophy Bactrim 1DS on M,W,F. FOLLOWS FOR: reports doing well since ov w/ TP.  still doing well on Dulera, but needs a refill.  still taking prednisone 9m daily. 1 dose of Rituxan still pending. Had to have transfusion last week for hemoglobin 7.7. No hemoptysis since a little bit at first recognition of her granulomatous disease. She has continued prednisone 60 mg daily since hospital discharge, and we have discussed tapering the dose. No coughing, no chest pain or fever.  01/17/13- 50 yoF former smoker followed for bilateral pulmonary infiltrates/ nodules, incr C-ANCA, s/p VATS bx/ Granulomatous Polyangiitis (Wegener's) vs cryptogenic organizing pneumonia/ BOOP, Metastatic intestinal carcinoid. Complicated by Hx breast  Ca 2006/ Dr Truddie Coco. AFib/ Dr Martinique, DM. FOLLOWS FOR:  Breathing has improved since last OV needs refill on dulera Had flu vaccine Paces herself. Feels she is getting stronger. Has not needed Dulera in 2 or 3 weeks.  ROS-see HPI Constitutional:   No-   weight loss, night sweats, fevers, chills, fatigue, lassitude. HEENT:   No-  headaches, difficulty swallowing, tooth/dental problems, sore throat,       No-  sneezing, itching, ear ache, nasal congestion, post nasal drip,  CV: +chest pain, orthopnea, PND, swelling in lower extremities, anasarca,   + chronic dizziness,   No-palpitations Resp: +   shortness of breath with exertion or at rest.           No- productive cough,  No non-productive cough,               No-   change in color of mucus.  No- wheezing.   Skin: No-   rash or lesions. no- pruritus GI:  No-   heartburn, indigestion, abdominal pain, nausea, vomiting,  GU: MS:  No-   joint pain or swelling.  Neuro-     +Vertigo- uses wheelchair Psych:  No- change in mood or affect. No depression or anxiety.  No memory loss.  OBJ General- Alert, Oriented, Affect-appropriate, Distress- none acute, overweight, wheelchair Skin- rash-none, lesions- none, excoriation- none Lymphadenopathy- none Head- atraumatic            Eyes- Gross vision intact, PERRLA, conjunctivae -pale            Ears- Hearing, canals-normal            Nose- turbinate edema, no-Septal dev, mucus, polyps, erosion, perforation             Throat- Mallampati II-III , mucosa clear , drainage- none, tonsils- atrophic Neck- flexible , trachea midline, no stridor , thyroid nl, carotid no bruit Chest - symmetrical excursion , unlabored           Heart/CV-+ irregularly irregular , 2-3/6 precordial systolic murmur , no gallop  , no rub, nl s1 s2                           - JVD- none , edema+1, stasis changes- none, varices- none           Lung- clear, no wheeze , cough- none , dullness-none, rub- none           Chest wall-  healed VATS thoracotomy incision L Abd-  Br/ Gen/ Rectal- Not done, not indicated Extrem- cyanosis- none, clubbing, none, atrophy- none, strength- nl. Wheelchair for distance/ cane at home.  Neuro- grossly intact to observation     04/03/11- 80 year old female former smoking history referred courtesy of Dr. Elease Hashimoto with concern of abnormal chest CT. Husband is an allergy patient here. Dr. Erick Blinks recent office note reviewed "Followup from recent pneumonia. Patient had presented here with extreme myalgias and chronic pain along with bilateral shoulder hip pain. Very high sedimentation rate of 120.  She almost total resolution of myalgias with low dose prednisone strongly suggesting polymyalgia rheumatica. She subsequently presented with cough with trace of blood but no dyspnea, fever, reported a pain. Chest x-ray showed bilateral infiltrates suggesting probable bilateral pneumonia the recommendation for CT  scan. CT scan revealed no adenopathy and no evidence for a lung mass. Compatible with bilateral pneumonia. Patient placed on antibiotic and much better at this time with no fever and essentially no cough. Her muscle and joint pains are almost 100% resolved with low-dose prednisone. She did present back in September with severe sinus disease suggesting acute sinusitis. CT revealed no acute findings. She denies any arthralgias at this time. No skin rash. No fever." She has been on prednisone 10 mg twice daily since December 13 and feels significantly better. Morning cough has cleared with residual trace phlegm and trace blood. No chest pain. CT chest 03/23/2011 -images reviewed by me-bilateral patchy airspace disease with air bronchograms. No cavitation or definite nodularity seen. She has had right total knee replacement but no generalized arthritis. Childhood exposure to an uncle with tuberculosis. Her PPD skin test was negative. She denies history of lung disease, GERD, DVT, kidney or liver  disease. Lab- C-ANCA POS 1:320, P-ANCA NEG. RA 1:41, ANA NEG40 year old female former smoking history referred courtesy of Dr. Elease Hashimoto with concern of abnormal chest CT. Husband is an allergy patient here.  05/13/11- 19 yoF former smoker followed for bilateral pulmonary infiltrates, incr C-ANCA, s/p VATS bx/ organizing pneumonia, Metastatic intestinal carcinoid. Marland Kitchen Hx breast Ca 2006/ Dr Truddie Coco Husband here. She returns now after left lung biopsy. She has expected left thoracotomy pain but is otherwise stable as she continues maintenance prednisone. Denies blood, fever, swollen glands, discolored sputum. Exertional dyspnea is not worse, allowing for the surgery. I shared with her and her husband the available initial pathology: Patchy organizing pneumonia with chronic interstitial pneumonia and focal atypical glands. I reviewed the discussion about her at thoracic oncology conference. The parenchymal lung process is not a vasculitis as I originally questioned. I told her there was an incidental finding of cancer cells with further identification pending but that this was a separate process. We agreed to refer her to Dr. Truddie Coco who managed her breast cancer. After she had left, personal communication from Dr. Jimmy Footman- neuroendocrine tumor staining consistent with metastatic intestinal carcinoid.  06/20/11  46 yoF former smoker followed for bilateral pulmonary infiltrates, incr C-ANCA, s/p VATS bx/ organizing pneumonia, Metastatic intestinal carcinoid. Marland Kitchen Hx breast Ca 2006/ Dr Truddie Coco   Husband here She says her breathing is now "fine" back to her normal. She denies fever, cough, sputum. I spoke with Mrs. Lennartz myself and had to explain the discovery of carcinoid after her lung biopsy. We also have the note from the [facility] indicating they discussed it with her. She has absolutely no recollection. Little cough or shortness of breath now. Still a little posterior thoracotomy incision pain. CT 05/19/11-  images reviewed with them- IMPRESSION:  1. Marked improvement in bilateral air space disease seen on  comparison CT of 03/24/2011. Near complete resolution.  2. Postoperative change in the left hemithorax consistent with  recent VATS.  Original Report Authenticated By: Suzy Bouchard, M.D.   11/07/11- 66 yoF former smoker followed for bilateral pulmonary infiltrates, incr C-ANCA, s/p VATS bx/ organizing pneumonia, Metastatic intestinal carcinoid. Marland Kitchen Hx breast Ca 2006/ Dr Truddie Coco    c/o  Chronic vertigo, difficulty breathing and gets tired easy. Also some wheezing, post nasal,  and cough.  Some days she feels much better than others, no real pattern. Occasional light cough to clear throat. We had called in prednisone, which did help. Ambulatory around home, but gets out little. CT chest 09/09/11- I reviewed w/ her- there is significant patchy  bilateral lower zone fibrotic scarring and some bronchial thickening. IMPRESSION:  1. No evidence of thoracic metastatic disease or other active  process.  2. Small mild cardiomegaly and hiatal hernia.  Original Report Authenticated By: Marlaine Hind, M.D.    02/09/12-  7 yoF former smoker followed for bilateral pulmonary infiltrates, incr C-ANCA, s/p VATS bx/ cryptogenic organizing pneumonia/ BOOP, Metastatic intestinal carcinoid. Marland Kitchen Hx breast Ca 2006/ Dr Truddie Coco  January had biopsy to ck for other lung condition. SOB and wheezing at times. Left sided pain around the  Back. Continues Advair 250. Due for followup chest CT in December for Dr. Jacquiline Doe. Not on prednisone. She says breathing is unchanged. Denies pain, blood or swollen nodes.   05/11/12-  47 yoF former smoker followed for bilateral pulmonary infiltrates, incr C-ANCA, s/p VATS bx/ cryptogenic organizing pneumonia/ BOOP, Metastatic intestinal carcinoid. Complicated by Hx breast Ca 2006/ Dr Truddie Coco. AFib/ Dr Martinique, DM. She says her husband is developing dementia. Deep breath makes her left flank feel  tight where she had thoracentesis. Complains of itching since starting Eliquis.Atarax 50 mg was too strong and Benadryl did not help. Itching keeps her awake all night. No visible rash. She is convinced really unpleasant itching began when she started Eliquis. I explained this medication needs to be transitioned carefully to anything else and Dr. Martinique would need to be the one to manage that. CT chest 03/26/12 . IMPRESSION:  1. No findings to strongly suggest metastatic disease to the  thorax.  2. There is a new focus of nodular ground-glass attenuation  measuring 1 cm in the anterior aspect of the left upper lobe which  is highly nonspecific. Initial follow-up by chest CT without  contrast is recommended in 3 months to confirm persistence. This  recommendation follows the consensus statement: Recommendations for  the Management of Subsolid Pulmonary Nodules Detected at CT: A  Statement from the Fleischner Society as published in Radiology  2013; 266:304-317.  3. Atherosclerosis, including left main and three-vessel coronary  artery disease. Assessment for potential risk factor  modification, dietary therapy or pharmacologic therapy may be  warranted, if clinically indicated.  4. Slight increase in the small amount of pericardial fluid  compared to the prior examination. This is unlikely to be of  hemodynamic significance at this time.  5. Moderate cardiomegaly.  6. Additional incidental findings, similar to prior studies, as  discussed above.  Original Report Authenticated By: Vinnie Langton, M.D.   08/08/12- 15 yoF former smoker followed for bilateral pulmonary infiltrates/ nodules, incr C-ANCA, s/p VATS bx/ cryptogenic organizing pneumonia/ BOOP, Metastatic intestinal carcinoid. Complicated by Hx breast Ca 2006/ Dr Truddie Coco. AFib/ Dr Martinique, DM. Itching stopped, while continuing Eliquis. She had changed detergents and fabric softener, but cause unknown. Seasonal rhinitis symptoms now.  Total IgE 05/11/2012 was less than 1.5. New burning itch, intermittent, right lateral ribs x2 weeks. Has had shingles shot. No visible rash. CT chest 08/02/12 IMPRESSION:  Interval persistence but decrease in size of the ground-glass  nodules seen in the anterior left upper lobe. Adenocarcinoma  cannot be excluded. Followup by CT is recommended in 12 months,  with continued annual surveillance for a minimum of 3 years.  These recommendations are taken from "Recommendations for the  Management of Subsolid Pulmonary Nodules Detected at CT: A  Statement from the [place]" Radiology 2013; 266:1, 304-  317.  Original Report Authenticated By: Misty Stanley, M.D.   11/19/2012 [facility] follow up  Patient presents for a post hospital  followup Admitted 11/01/2012 - 11/08/2012 for  Granulomatous Polyangitis (former Wegener). with Alveolar Hemorrhage. Relapse since 2013 11/02/12 PR-3 880. MPO negative. - GBM neg Likely - capillaritis. 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 Admitted with hemoptysis  hemoptysis with CT scan chest findings of alveolar hemorrhage  As of June 2014 the oncologist did not feel there was any evidence of breast cancer recurrence.CT scan of the chest ruled out pulmonary embolism but showed severe bilateral groundglass opacities consistent with alveolar hemorrhage pattern. Pulmonary consultation was called. She was initially treated by stopping eliquis, providing empiric antibiotics and supported with diuresis.  Pulmonary  felt The clinical picture was felt to be consistent with diffuse alveolar hemorrhage resulting in hemoptysis. Most likely cause of alveolar hemorrhage was felt to be autoimmune based on past history of autoimmune antibody positivity and lung biopsy showing organizing pneumonia in December 2012/January 2013.  Prior C.-ANCA positive though no evidence of vasculitis in Jan 2013 lung bx which is puzzling. Alternative is relapsing BOOP but the hemoptysis would be unsual unless precipitated by Eliquis. On 11/02/12 PR-3 880. Nephrology consult  On 7/29 immunosuppression therapy was initiated. She was given Pulse methylprednisolone, for three days, and her first dose of Rituxan was given on 7/30. The following days we saw significant clinical improvement in regards to hemoptysis, pulmonary infiltrates and dyspnea, She was started on  Rituxan over cyclophosphamide due to malignancy history, and also this is technically a "relapse" or A "flare". Dose starting 11/03/12 will be Induction therapy with rituximab (375 mg/m2 per week for four weeks). W/  PCP prophylaxis w/ - Bactrim 1 DS on M, W, F  Since discharge she is feeling better with resolution of hemoptysis, . Dyspnea is less and she is starting to get some of her energy back.  No fever , orthopnea or edema.  Had labs done last week. , hbg stable at 8.9 , renal fx tr down at 1.6 (max scr 2.4) . Ov with oncology last week. Rituxan  infusion given.   12/03/12- 89 yoF former smoker followed for bilateral pulmonary infiltrates/ nodules, incr C-ANCA, s/p VATS bx/ Granulomatous Polyangiitis (Wegener's) vs cryptogenic organizing pneumonia/ BOOP, Metastatic intestinal carcinoid. Complicated by Hx breast Ca 2006/ Dr Truddie Coco. AFib/ Dr [place], DM. Started Rituxan 375 mg/m2x 4 weeks 11/03/12. PCP prophy Bactrim 1DS on M,W,F. FOLLOWS FOR: reports doing well since ov w/ TP.  still doing well on Dulera, but needs a refill.  still taking prednisone 25m daily. 1 dose of Rituxan still pending. Had to have transfusion last week for hemoglobin 7.7. No hemoptysis since a little bit at first recognition of her granulomatous disease. She has continued prednisone 60 mg daily since hospital discharge, and we have discussed tapering the dose. No coughing, no chest pain or fever.  01/17/13- 89 yoF former smoker followed for bilateral pulmonary infiltrates/ nodules, incr C-ANCA, s/p VATS bx/ Granulomatous Polyangiitis (Wegener's) vs cryptogenic organizing pneumonia/ BOOP, Metastatic intestinal carcinoid. Complicated by Hx breast Ca 2006/ Dr RTruddie Coco AFib/ Dr JMartinique DM. FOLLOWS FOR:  Breathing has improved since last OV needs refill on dulera Had flu vaccine Paces herself. Feels she is getting stronger. Has not needed Dulera in 2 or 3 weeks.  04/22/14- 75yoF former smoker followed for bilateral pulmonary infiltrates/ nodules, incr C-ANCA, s/p VATS bx/ Granulomatous Polyangiitis (Wegener's) vs cryptogenic organizing pneumonia/ BOOP, Metastatic intestinal  carcinoid. Complicated by Hx breast Ca 2006/ Dr Truddie Coco. AFib/ Dr Martin[place], DM. FOLLOWS FOR: recently in hospital; bronchitis Hospital 1/7-1/12/15- acute bronchitis/respiratory failure/probably viral. There may have been fluid overload, she feels better now on daily Lasix. Prednisone now at 1/2x20 mg daily. CT chest 04/14/13 IMPRESSION:  1. Stable cardiac enlargement and coronary artery calcifications.  2. Patchy ground-glass opacity in both lungs with vague nodularity.  Findings suggest a partial airspace filling process such as edema,  hemorrhage or inflammatory or infectious alveolitis. No focal  airspace consolidation.  3. Possible new cystic lesion in the pancreatic body/tail junction  region. Please see above discussion.  Electronically Signed  By: Kalman Jewels M.D.  On: 04/14/2013 10:56  ROS-see HPI Constitutional:   No-   weight loss, night sweats, fevers, chills, fatigue, lassitude. HEENT:   No-  headaches, difficulty swallowing, tooth/dental problems, sore throat,       No-  sneezing, itching, ear ache, nasal congestion, post nasal drip,  CV: +chest pain, orthopnea, PND, swelling in lower extremities, anasarca,   + chronic dizziness,   No-palpitations Resp: +   shortness of breath with exertion or at rest.           No-  productive cough,  No non-productive cough,               No-   change in color of mucus.  No- wheezing.   Skin: No-   rash or lesions. no- pruritus GI:  No-   heartburn, indigestion, abdominal pain, nausea, vomiting,  GU: MS:  No-   joint pain or swelling.  Neuro-     +Vertigo- uses wheelchair Psych:  No- change in mood or affect. No depression or anxiety.  No memory loss.  OBJ General- Alert, Oriented, Affect-appropriate, Distress- none acute, overweight, wheelchair Skin- rash-none, lesions- none, excoriation- none Lymphadenopathy- none Head- atraumatic            Eyes- Gross vision intact, PERRLA, conjunctivae -pale            Ears- Hearing, canals-normal            Nose- turbinate edema, no-Septal dev, mucus, polyps, erosion, perforation             Throat- Mallampati II-III , mucosa clear , drainage- none, tonsils- atrophic Neck- flexible , trachea midline, no stridor , thyroid nl, carotid no bruit Chest - symmetrical excursion , unlabored           Heart/CV-+ irregularly irregular , 2-3/6 precordial systolic murmur , no gallop  , no rub, nl               s1 s2                           - JVD+full , edema+1, stasis changes- none, varices- none           Lung- clear, no wheeze , cough- none , dullness-none, rub- none           Chest wall- healed VATS thoracotomy incision L Abd-  Br/ Gen/ Rectal- Not done, not indicated Extrem- cyanosis- none, clubbing, none, atrophy- none, strength- nl. Wheelchair for distance/ cane at home.  Neuro- grossly intact to observation

## 2013-04-22 NOTE — Telephone Encounter (Signed)
Pt aware that RX is sent to pharmacy

## 2013-04-22 NOTE — Patient Instructions (Signed)
Continue prednisone 10 mg daily  Please call as needed

## 2013-04-28 ENCOUNTER — Encounter: Payer: Self-pay | Admitting: Family Medicine

## 2013-04-28 ENCOUNTER — Ambulatory Visit (INDEPENDENT_AMBULATORY_CARE_PROVIDER_SITE_OTHER): Payer: Medicare Other | Admitting: Family Medicine

## 2013-04-28 ENCOUNTER — Telehealth: Payer: Self-pay | Admitting: *Deleted

## 2013-04-28 VITALS — BP 132/72 | HR 68 | Temp 98.5°F | Wt 200.0 lb

## 2013-04-28 DIAGNOSIS — I10 Essential (primary) hypertension: Secondary | ICD-10-CM

## 2013-04-28 DIAGNOSIS — R0609 Other forms of dyspnea: Secondary | ICD-10-CM

## 2013-04-28 DIAGNOSIS — K862 Cyst of pancreas: Secondary | ICD-10-CM

## 2013-04-28 DIAGNOSIS — R0989 Other specified symptoms and signs involving the circulatory and respiratory systems: Secondary | ICD-10-CM

## 2013-04-28 DIAGNOSIS — E119 Type 2 diabetes mellitus without complications: Secondary | ICD-10-CM

## 2013-04-28 DIAGNOSIS — K863 Pseudocyst of pancreas: Secondary | ICD-10-CM

## 2013-04-28 DIAGNOSIS — R609 Edema, unspecified: Secondary | ICD-10-CM

## 2013-04-28 DIAGNOSIS — R0600 Dyspnea, unspecified: Secondary | ICD-10-CM

## 2013-04-28 MED ORDER — FUROSEMIDE 20 MG PO TABS: 20.0000 mg | ORAL_TABLET | Freq: Every day | ORAL | Status: DC

## 2013-04-28 NOTE — Patient Instructions (Signed)
Consider daily weights and be in touch if weight increases 3 pounds in one day or over 5 pounds in one week. Follow up for any increased shortness of breath or any fever.

## 2013-04-28 NOTE — Progress Notes (Signed)
Pre visit review using our clinic review tool, if applicable. No additional management support is needed unless otherwise documented below in the visit note.

## 2013-04-28 NOTE — Progress Notes (Signed)
Subjective:    Patient ID: [name] [name], female    DOB: October 17, 1932, 80 y.o.   MRN: [id number]  HPI Hospital followup. Patient is admitted 04/13/2013 and discharged 04/18/2013. She has multiple chronic problems including history of Wegener's granulomatosis, chronic atrial fibrillation, boop, type 2 diabetes, hypertension, remote history breast cancer, metastatic intestinal carcinoid.  She was admitted with low-grade fever and productive cough and some increasing shortness of breath. Chest x-ray showed question of some ? infiltrates. CT unremarkable. BNP was elevated. She was given in the ED IV Lasix, prednisone, albuterol, and Levaquin. Influenza screen was negative. She was continued on Levaquin. She had a CT chest which showed some patchy groundglass opacities in both lungs and question of some worsening vascular congestion. Repeat echocardiogram revealed normal EF. Suspected pulmonary hypertension with possible right heart failure.  Patient eventually improved. Her chronic kidney disease remains stable. She remained on oral anticoagulants for atrial fibrillation. She had incidental finding of pancreatic cyst on MRI abdomen with consideration for six-month followup. At this point, patient there is near baseline with regard to respiratory symptoms. She has had some fatigue. She is getting physical therapy 3 times per week. Her current prednisone 10 mg daily. Recent A1c 7.2%. She takes Lantus 10 units once daily. Only one episode of hypo-mild hypoglycemia past several weeks with blood sugar 62.  Past Medical History  Diagnosis Date  . HYPOTHYROIDISM 07/24/2008  . HYPERLIPIDEMIA 07/24/2008  . DEPRESSION 05/16/2009  . HYPERTENSION 07/24/2008  . Vertigo   . Diabetes mellitus type II   . Arthritis     r tkr  . CVA (cerebral vascular accident) 2008    mini stroke.no residual  . Heart murmur     stress test 2009.dr peter Martinique  . Intermittent vertigo   . Skin abnormality     facial lesions .pt  applying mupiracin to areas  . Atrial fibrillation   . Breast cancer   . Pneumonia   . 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 Knee surgery  2009    TKR  . Cholecystectomy  1980  . Joint replacement      r knee  . Breast surgery  2007    Lumpectomy, XRT 2006.l breast  . Video assisted thoracoscopy  04/22/2011    Procedure: VIDEO ASSISTED THORACOSCOPY;  Surgeon: Melrose Nakayama, MD;  Location: [facility];  Service: Thoracic;  Laterality: Left;  WITH BIOPSY  . Heel spur surgery Right   . Exploratory laparotomy      reports that she quit smoking about 30 years ago. Her smoking use included Cigarettes. She has a 6 pack-year smoking history. She has never used smokeless tobacco. She reports that she does not drink alcohol or use illicit drugs. family history includes Arthritis in her mother; Breast cancer in her sister; Cancer in her sister; Coronary artery disease in her brother and father; Heart disease in her father; Lung cancer in her brother; Rheum arthritis in her mother. Allergies  Allergen Reactions  . Codeine Sulfate     REACTION: GI upset  . Sulfonamide Derivatives     REACTION: GI upset  . Atarax [Hydroxyzine] Nausea Only and Rash      Review of Systems  Constitutional: Negative for fever and chills.  Respiratory: Negative for cough, shortness of breath and wheezing.   Cardiovascular: Positive for leg swelling. Negative for chest pain and palpitations.  Gastrointestinal: Negative for abdominal pain.  Genitourinary: Negative for dysuria.  Neurological: Negative for dizziness.       Objective:   Physical Exam  Constitutional: She is oriented to person, place, and time. She appears well-developed and well-nourished.  HENT:  Mouth/Throat: Oropharynx is clear and moist.  Neck: Neck supple. No thyromegaly present.  Cardiovascular: Normal rate.   Pulmonary/Chest: Effort normal and breath sounds normal. No respiratory  distress. She has no wheezes. She has no rales.  Musculoskeletal: She exhibits edema.  Trace edema legs bilaterally  Neurological: She is alert and oriented to person, place, and time.          Assessment & Plan:  #1 recent dyspnea with probable right-sided heart failure/pulmonary htn.. She is symptomatically stable currently on Lasix 20 mg daily. She's not doing daily weights and we have suggested that she consider this to help monitor her fluid status. #2 type 2 diabetes exacerbated by prednisone. She is currently weaned down to 10 mg daily and further tapering will be per pulmonary. Repeat A1c in followup in 3 months. Continue Lantus 10 units once daily #3 recent incidental pancreatic cyst. Consider followup MRI 6 months. This point she is undecided whether she is willing to go through that. #4 chronic atrial fibrillation rate controlled on Eliquis. #5 chronic kidney disease which is stable  #6 hyperlipidemia on statin. #7 hypertension which is currently stable.

## 2013-04-28 NOTE — Telephone Encounter (Signed)
Transitional care  Admit date: 04/13/2013 Discharge date:04/18/2013  Discharge diagnoses: Active problems:   Hypertension   cryptogenic organizing pneumonia   Atrial fibrillation   Pancreatic cyst   Boop    Discharge condition: improved  Recommendation for outpatient follow-up 1. Follow up with PCP in 1-2 weeks  2. Follow up with dr young  Talked with patient and stated she is better with no more shortness of breath, but complaining that her blood sugar is low in am (she takes 10u of lantus At bedtime) but her blood sugar goes up after she takes her prednisone. She  Is compliant with all of her medication and take her eliquis as directed for her atrial fibrillation.  She is ambulating around the house.    Patient has follow up appointment with Dr Elease Hashimoto 04/28/2013 at [facility].

## 2013-04-29 ENCOUNTER — Telehealth: Payer: Self-pay

## 2013-04-29 NOTE — Telephone Encounter (Signed)
Relevant patient education mailed to patient.  

## 2013-05-02 ENCOUNTER — Other Ambulatory Visit: Payer: Self-pay | Admitting: Family Medicine

## 2013-05-04 ENCOUNTER — Ambulatory Visit: Payer: Medicare Other | Admitting: Family Medicine

## 2013-05-04 ENCOUNTER — Encounter: Payer: Self-pay | Admitting: Family Medicine

## 2013-05-04 ENCOUNTER — Ambulatory Visit (INDEPENDENT_AMBULATORY_CARE_PROVIDER_SITE_OTHER): Payer: Medicare Other | Admitting: Family Medicine

## 2013-05-04 VITALS — BP 118/66 | HR 68 | Temp 99.1°F

## 2013-05-04 DIAGNOSIS — J988 Other specified respiratory disorders: Secondary | ICD-10-CM | POA: Diagnosis not present

## 2013-05-04 DIAGNOSIS — J22 Unspecified acute lower respiratory infection: Secondary | ICD-10-CM

## 2013-05-04 MED ORDER — LEVOFLOXACIN 500 MG PO TABS: 500.0000 mg | ORAL_TABLET | Freq: Every day | ORAL | Status: DC

## 2013-05-04 MED ORDER — LEVOFLOXACIN 500 MG PO TABS: ORAL_TABLET | ORAL | Status: DC

## 2013-05-04 NOTE — Patient Instructions (Signed)
Stay on Mucinex Stay well hydrated Start Levaquin as instructed.

## 2013-05-04 NOTE — Progress Notes (Signed)
Subjective:    Patient ID: [name] [name], female    DOB: 1932-12-17, 80 y.o.   MRN: [id number]  HPI Acute visit Patient developed yesterday cough productive of thick yellow sputum. She's had some nasal congestion. Possible low-grade fever. She does not describe any dyspnea at rest. She is very sedentary. She denies any significant body aches. Her husband has had somewhat similar symptoms.  She has chronic medical problems including Wegener's granulomatosis, type 2 diabetes, chronic kidney disease among others. She smoked only briefly several years ago. Denies any hemoptysis. She remains on low-dose prednisone 10 mg daily  Past Medical History  Diagnosis Date  . HYPOTHYROIDISM 07/24/2008  . HYPERLIPIDEMIA 07/24/2008  . DEPRESSION 05/16/2009  . HYPERTENSION 07/24/2008  . Vertigo   . Diabetes mellitus type II   . Arthritis     r tkr  . CVA (cerebral vascular accident) 2008    mini stroke.no residual  . Heart murmur     stress test 2009.dr peter Martinique  . Intermittent vertigo   . Skin abnormality     facial lesions .pt applying mupiracin to areas  . Atrial fibrillation   . Breast cancer   . Pneumonia   . 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 Knee surgery  2009    TKR  . Cholecystectomy  1980  . Joint replacement      r knee  . Breast surgery  2007    Lumpectomy, XRT 2006.l breast  . Video assisted thoracoscopy  04/22/2011    Procedure: VIDEO ASSISTED THORACOSCOPY;  Surgeon: Melrose Nakayama, MD;  Location: [facility];  Service: Thoracic;  Laterality: Left;  WITH BIOPSY  . Heel spur surgery Right   . Exploratory laparotomy      reports that she quit smoking about 30 years ago. Her smoking use included Cigarettes. She has a 6 pack-year smoking history. She has never used smokeless tobacco. She reports that she does not drink alcohol or use illicit drugs. family history includes Arthritis in her mother; Breast cancer in her  sister; Cancer in her sister; Coronary artery disease in her brother and father; Heart disease in her father; Lung cancer in her brother; Rheum arthritis in her mother. Allergies  Allergen Reactions  . Codeine Sulfate     REACTION: GI upset  . Sulfonamide Derivatives     REACTION: GI upset  . Atarax [Hydroxyzine] Nausea Only and Rash      Review of Systems  Constitutional: Positive for fever. Negative for chills and fatigue.  HENT: Positive for congestion.   Respiratory: Positive for cough. Negative for shortness of breath.   Cardiovascular: Negative for chest pain.       Objective:   Physical Exam  Constitutional: She appears well-developed and well-nourished.  HENT:  Right Ear: External ear normal.  Left Ear: External ear normal.  Mouth/Throat: Oropharynx is clear and moist.  Neck: Neck supple.  Cardiovascular: Normal rate.   Pulmonary/Chest: Effort normal.  Slightly diminished breath sounds left upper anterior lung. Few faint wheezes otherwise clear. No rales  Musculoskeletal: She exhibits no edema.  Lymphadenopathy:    She has no cervical adenopathy.          Assessment & Plan:  Acute respiratory illness. Doubt influenza. Given her chronic lung disease issues and productive cough along with possible low-grade fever start Levaquin 500 mg today then 250 mg daily for 6 days. She has chronic kidney disease with most recent GFR  of 29. Followup promptly for increasing fever shortness of breath

## 2013-05-04 NOTE — Progress Notes (Signed)
Pre visit review using our clinic review tool, if applicable. No additional management support is needed unless otherwise documented below in the visit note.

## 2013-05-07 ENCOUNTER — Inpatient Hospital Stay (HOSPITAL_COMMUNITY)
Admission: EM | Admit: 2013-05-07 | Discharge: 2013-05-10 | DRG: 291 | Disposition: A | Discharge status: Home Health | Payer: Medicare Other | Attending: Internal Medicine | Admitting: Internal Medicine

## 2013-05-07 ENCOUNTER — Emergency Department (HOSPITAL_COMMUNITY): Payer: Medicare Other

## 2013-05-07 ENCOUNTER — Encounter (HOSPITAL_COMMUNITY): Payer: Self-pay | Admitting: Emergency Medicine

## 2013-05-07 DIAGNOSIS — E785 Hyperlipidemia, unspecified: Secondary | ICD-10-CM

## 2013-05-07 DIAGNOSIS — C50919 Malignant neoplasm of unspecified site of unspecified female breast: Secondary | ICD-10-CM

## 2013-05-07 DIAGNOSIS — D649 Anemia, unspecified: Secondary | ICD-10-CM

## 2013-05-07 DIAGNOSIS — Z79899 Other long term (current) drug therapy: Secondary | ICD-10-CM

## 2013-05-07 DIAGNOSIS — I4891 Unspecified atrial fibrillation: Secondary | ICD-10-CM | POA: Diagnosis not present

## 2013-05-07 DIAGNOSIS — N179 Acute kidney failure, unspecified: Secondary | ICD-10-CM | POA: Diagnosis present

## 2013-05-07 DIAGNOSIS — F329 Major depressive disorder, single episode, unspecified: Secondary | ICD-10-CM | POA: Diagnosis present

## 2013-05-07 DIAGNOSIS — E669 Obesity, unspecified: Secondary | ICD-10-CM

## 2013-05-07 DIAGNOSIS — J441 Chronic obstructive pulmonary disease with (acute) exacerbation: Secondary | ICD-10-CM

## 2013-05-07 DIAGNOSIS — I2789 Other specified pulmonary heart diseases: Secondary | ICD-10-CM

## 2013-05-07 DIAGNOSIS — E039 Hypothyroidism, unspecified: Secondary | ICD-10-CM | POA: Diagnosis present

## 2013-05-07 DIAGNOSIS — I50810 Right heart failure, unspecified: Secondary | ICD-10-CM

## 2013-05-07 DIAGNOSIS — I129 Hypertensive chronic kidney disease with stage 1 through stage 4 chronic kidney disease, or unspecified chronic kidney disease: Secondary | ICD-10-CM | POA: Diagnosis present

## 2013-05-07 DIAGNOSIS — R0989 Other specified symptoms and signs involving the circulatory and respiratory systems: Secondary | ICD-10-CM | POA: Diagnosis not present

## 2013-05-07 DIAGNOSIS — Z96659 Presence of unspecified artificial knee joint: Secondary | ICD-10-CM

## 2013-05-07 DIAGNOSIS — Z8249 Family history of ischemic heart disease and other diseases of the circulatory system: Secondary | ICD-10-CM | POA: Diagnosis not present

## 2013-05-07 DIAGNOSIS — R42 Dizziness and giddiness: Secondary | ICD-10-CM

## 2013-05-07 DIAGNOSIS — Z794 Long term (current) use of insulin: Secondary | ICD-10-CM

## 2013-05-07 DIAGNOSIS — M3130 Wegener's granulomatosis without renal involvement: Secondary | ICD-10-CM

## 2013-05-07 DIAGNOSIS — Z853 Personal history of malignant neoplasm of breast: Secondary | ICD-10-CM | POA: Diagnosis not present

## 2013-05-07 DIAGNOSIS — Z803 Family history of malignant neoplasm of breast: Secondary | ICD-10-CM | POA: Diagnosis not present

## 2013-05-07 DIAGNOSIS — C7A029 Malignant carcinoid tumor of the large intestine, unspecified portion: Secondary | ICD-10-CM

## 2013-05-07 DIAGNOSIS — R042 Hemoptysis: Secondary | ICD-10-CM

## 2013-05-07 DIAGNOSIS — D631 Anemia in chronic kidney disease: Secondary | ICD-10-CM | POA: Diagnosis present

## 2013-05-07 DIAGNOSIS — N039 Chronic nephritic syndrome with unspecified morphologic changes: Secondary | ICD-10-CM

## 2013-05-07 DIAGNOSIS — R609 Edema, unspecified: Secondary | ICD-10-CM | POA: Diagnosis not present

## 2013-05-07 DIAGNOSIS — I5033 Acute on chronic diastolic (congestive) heart failure: Principal | ICD-10-CM

## 2013-05-07 DIAGNOSIS — R931 Abnormal findings on diagnostic imaging of heart and coronary circulation: Secondary | ICD-10-CM

## 2013-05-07 DIAGNOSIS — E871 Hypo-osmolality and hyponatremia: Secondary | ICD-10-CM

## 2013-05-07 DIAGNOSIS — I509 Heart failure, unspecified: Secondary | ICD-10-CM | POA: Diagnosis not present

## 2013-05-07 DIAGNOSIS — E860 Dehydration: Secondary | ICD-10-CM

## 2013-05-07 DIAGNOSIS — R109 Unspecified abdominal pain: Secondary | ICD-10-CM

## 2013-05-07 DIAGNOSIS — J84116 Cryptogenic organizing pneumonia: Secondary | ICD-10-CM | POA: Diagnosis not present

## 2013-05-07 DIAGNOSIS — M1990 Unspecified osteoarthritis, unspecified site: Secondary | ICD-10-CM

## 2013-05-07 DIAGNOSIS — Z87891 Personal history of nicotine dependence: Secondary | ICD-10-CM | POA: Diagnosis not present

## 2013-05-07 DIAGNOSIS — R0489 Hemorrhage from other sites in respiratory passages: Secondary | ICD-10-CM

## 2013-05-07 DIAGNOSIS — D509 Iron deficiency anemia, unspecified: Secondary | ICD-10-CM | POA: Diagnosis present

## 2013-05-07 DIAGNOSIS — K862 Cyst of pancreas: Secondary | ICD-10-CM

## 2013-05-07 DIAGNOSIS — F3289 Other specified depressive episodes: Secondary | ICD-10-CM

## 2013-05-07 DIAGNOSIS — I272 Pulmonary hypertension, unspecified: Secondary | ICD-10-CM | POA: Diagnosis present

## 2013-05-07 DIAGNOSIS — D3A029 Benign carcinoid tumor of the large intestine, unspecified portion: Secondary | ICD-10-CM | POA: Diagnosis present

## 2013-05-07 DIAGNOSIS — Z8673 Personal history of transient ischemic attack (TIA), and cerebral infarction without residual deficits: Secondary | ICD-10-CM | POA: Diagnosis not present

## 2013-05-07 DIAGNOSIS — E119 Type 2 diabetes mellitus without complications: Secondary | ICD-10-CM

## 2013-05-07 DIAGNOSIS — J189 Pneumonia, unspecified organism: Secondary | ICD-10-CM | POA: Diagnosis present

## 2013-05-07 DIAGNOSIS — Z801 Family history of malignant neoplasm of trachea, bronchus and lung: Secondary | ICD-10-CM | POA: Diagnosis not present

## 2013-05-07 DIAGNOSIS — J9600 Acute respiratory failure, unspecified whether with hypoxia or hypercapnia: Secondary | ICD-10-CM | POA: Diagnosis not present

## 2013-05-07 DIAGNOSIS — L299 Pruritus, unspecified: Secondary | ICD-10-CM

## 2013-05-07 DIAGNOSIS — IMO0002 Reserved for concepts with insufficient information to code with codable children: Secondary | ICD-10-CM

## 2013-05-07 DIAGNOSIS — N184 Chronic kidney disease, stage 4 (severe): Secondary | ICD-10-CM | POA: Diagnosis not present

## 2013-05-07 DIAGNOSIS — J069 Acute upper respiratory infection, unspecified: Secondary | ICD-10-CM | POA: Diagnosis present

## 2013-05-07 DIAGNOSIS — I10 Essential (primary) hypertension: Secondary | ICD-10-CM

## 2013-05-07 DIAGNOSIS — R1032 Left lower quadrant pain: Secondary | ICD-10-CM

## 2013-05-07 LAB — COMPREHENSIVE METABOLIC PANEL
ALT: 16 U/L (ref 0–35)
AST: 20 U/L (ref 0–37)
Albumin: 3.3 g/dL — ABNORMAL LOW (ref 3.5–5.2)
Alkaline Phosphatase: 58 U/L (ref 39–117)
BILIRUBIN TOTAL: 0.5 mg/dL (ref 0.3–1.2)
BUN: 16 mg/dL (ref 6–23)
CALCIUM: 8.6 mg/dL (ref 8.4–10.5)
CHLORIDE: 87 meq/L — AB (ref 96–112)
CO2: 24 meq/L (ref 19–32)
CREATININE: 1.59 mg/dL — AB (ref 0.50–1.10)
GFR calc non Af Amer: 30 mL/min — ABNORMAL LOW (ref >90)
GFR, EST AFRICAN AMERICAN: 35 mL/min — AB (ref >90)
GLUCOSE: 263 mg/dL — AB (ref 70–99)
Potassium: 4.9 mEq/L (ref 3.7–5.3)
Sodium: 124 mEq/L — ABNORMAL LOW (ref 137–147)
Total Protein: 6.4 g/dL (ref 6.0–8.3)

## 2013-05-07 LAB — CBC WITH DIFFERENTIAL/PLATELET
Basophils Absolute: 0 10*3/uL (ref 0.0–0.1)
Basophils Relative: 0 % (ref 0–1)
Eosinophils Absolute: 0 10*3/uL (ref 0.0–0.7)
Eosinophils Relative: 0 % (ref 0–5)
HEMATOCRIT: 24.4 % — AB (ref 36.0–46.0)
Hemoglobin: 8.2 g/dL — ABNORMAL LOW (ref 12.0–15.0)
LYMPHS PCT: 35 % (ref 12–46)
Lymphs Abs: 1.2 10*3/uL (ref 0.7–4.0)
MCH: 30.4 pg (ref 26.0–34.0)
MCHC: 33.6 g/dL (ref 30.0–36.0)
MCV: 90.4 fL (ref 78.0–100.0)
MONO ABS: 0.5 10*3/uL (ref 0.1–1.0)
Monocytes Relative: 15 % — ABNORMAL HIGH (ref 3–12)
NEUTROS ABS: 1.7 10*3/uL (ref 1.7–7.7)
Neutrophils Relative %: 50 % (ref 43–77)
Platelets: 204 10*3/uL (ref 150–400)
RBC: 2.7 MIL/uL — AB (ref 3.87–5.11)
RDW: 15.7 % — ABNORMAL HIGH (ref 11.5–15.5)
WBC: 3.4 10*3/uL — AB (ref 4.0–10.5)

## 2013-05-07 LAB — TROPONIN I: Troponin I: <0.3 ng/mL (ref <0.30)

## 2013-05-07 LAB — PRO B NATRIURETIC PEPTIDE: Pro B Natriuretic peptide (BNP): 6589 pg/mL — ABNORMAL HIGH (ref 0–450)

## 2013-05-07 MED ORDER — DIAZEPAM 5 MG PO TABS
5.0000 mg | ORAL_TABLET | Freq: Every day | ORAL | Status: DC | PRN
  Administered 2013-05-08: 5 mg via ORAL
  Filled 2013-05-07: qty 1

## 2013-05-07 MED ORDER — FUROSEMIDE 10 MG/ML IJ SOLN
40.0000 mg | Freq: Two times a day (BID) | INTRAMUSCULAR | Status: DC
  Administered 2013-05-07 – 2013-05-08 (×3): 40 mg via INTRAVENOUS
  Filled 2013-05-07 (×7): qty 4

## 2013-05-07 MED ORDER — APIXABAN 5 MG PO TABS
5.0000 mg | ORAL_TABLET | Freq: Two times a day (BID) | ORAL | Status: DC
  Administered 2013-05-07 – 2013-05-10 (×6): 5 mg via ORAL
  Filled 2013-05-07 (×7): qty 1

## 2013-05-07 MED ORDER — INSULIN GLARGINE 100 UNIT/ML ~~LOC~~ SOLN
10.0000 [IU] | Freq: Every day | SUBCUTANEOUS | Status: DC
  Administered 2013-05-07 – 2013-05-09 (×3): 10 [IU] via SUBCUTANEOUS
  Filled 2013-05-07 (×4): qty 0.1

## 2013-05-07 MED ORDER — SODIUM CHLORIDE 0.9 % IJ SOLN
3.0000 mL | Freq: Two times a day (BID) | INTRAMUSCULAR | Status: DC
  Administered 2013-05-07 – 2013-05-10 (×6): 3 mL via INTRAVENOUS

## 2013-05-07 MED ORDER — FUROSEMIDE 10 MG/ML IJ SOLN
40.0000 mg | Freq: Once | INTRAMUSCULAR | Status: AC
  Administered 2013-05-07: 40 mg via INTRAVENOUS
  Filled 2013-05-07: qty 4

## 2013-05-07 MED ORDER — INSULIN ASPART 100 UNIT/ML ~~LOC~~ SOLN
0.0000 [IU] | Freq: Every day | SUBCUTANEOUS | Status: DC
  Administered 2013-05-07: 5 [IU] via SUBCUTANEOUS
  Administered 2013-05-09: 3 [IU] via SUBCUTANEOUS

## 2013-05-07 MED ORDER — SODIUM CHLORIDE 0.9 % IJ SOLN: 3.0000 mL | INTRAMUSCULAR | Status: DC | PRN

## 2013-05-07 MED ORDER — GABAPENTIN 100 MG PO CAPS
100.0000 mg | ORAL_CAPSULE | Freq: Two times a day (BID) | ORAL | Status: DC
  Administered 2013-05-08 – 2013-05-10 (×5): 100 mg via ORAL
  Filled 2013-05-07 (×7): qty 1

## 2013-05-07 MED ORDER — NEBIVOLOL HCL 10 MG PO TABS
10.0000 mg | ORAL_TABLET | Freq: Every day | ORAL | Status: DC
  Administered 2013-05-07 – 2013-05-10 (×4): 10 mg via ORAL
  Filled 2013-05-07 (×4): qty 1

## 2013-05-07 MED ORDER — ALBUTEROL SULFATE (2.5 MG/3ML) 0.083% IN NEBU
2.5000 mg | INHALATION_SOLUTION | Freq: Four times a day (QID) | RESPIRATORY_TRACT | Status: DC
  Administered 2013-05-07 – 2013-05-08 (×4): 2.5 mg via RESPIRATORY_TRACT
  Filled 2013-05-07 (×5): qty 3

## 2013-05-07 MED ORDER — PREDNISONE 10 MG PO TABS
10.0000 mg | ORAL_TABLET | Freq: Every day | ORAL | Status: DC
  Administered 2013-05-08: 10 mg via ORAL
  Filled 2013-05-07 (×3): qty 1

## 2013-05-07 MED ORDER — SODIUM CHLORIDE 0.9 % IV SOLN: 250.0000 mL | INTRAVENOUS | Status: DC | PRN

## 2013-05-07 MED ORDER — MOMETASONE FURO-FORMOTEROL FUM 100-5 MCG/ACT IN AERO
2.0000 | INHALATION_SPRAY | Freq: Two times a day (BID) | RESPIRATORY_TRACT | Status: DC
  Administered 2013-05-07 – 2013-05-10 (×6): 2 via RESPIRATORY_TRACT
  Filled 2013-05-07: qty 8.8

## 2013-05-07 MED ORDER — LEVOTHYROXINE SODIUM 125 MCG PO TABS
125.0000 ug | ORAL_TABLET | Freq: Every day | ORAL | Status: DC
  Administered 2013-05-08 – 2013-05-10 (×3): 125 ug via ORAL
  Filled 2013-05-07 (×4): qty 1

## 2013-05-07 MED ORDER — NITROGLYCERIN 2 % TD OINT
1.0000 [in_us] | TOPICAL_OINTMENT | Freq: Four times a day (QID) | TRANSDERMAL | Status: DC
  Administered 2013-05-07 – 2013-05-08 (×3): 1 [in_us] via TOPICAL
  Filled 2013-05-07: qty 30

## 2013-05-07 MED ORDER — METHYLPREDNISOLONE SODIUM SUCC 125 MG IJ SOLR
125.0000 mg | Freq: Once | INTRAMUSCULAR | Status: AC
  Administered 2013-05-07: 125 mg via INTRAVENOUS
  Filled 2013-05-07: qty 2

## 2013-05-07 MED ORDER — IRBESARTAN 150 MG PO TABS
150.0000 mg | ORAL_TABLET | Freq: Every day | ORAL | Status: DC
  Administered 2013-05-07 – 2013-05-08 (×2): 150 mg via ORAL
  Filled 2013-05-07 (×3): qty 1

## 2013-05-07 MED ORDER — ACETAMINOPHEN 325 MG PO TABS: 650.0000 mg | ORAL_TABLET | ORAL | Status: DC | PRN

## 2013-05-07 MED ORDER — ONDANSETRON HCL 4 MG/2ML IJ SOLN: 4.0000 mg | Freq: Four times a day (QID) | INTRAMUSCULAR | Status: DC | PRN

## 2013-05-07 MED ORDER — ATORVASTATIN CALCIUM 10 MG PO TABS
10.0000 mg | ORAL_TABLET | Freq: Every day | ORAL | Status: DC
  Administered 2013-05-08 – 2013-05-09 (×2): 10 mg via ORAL
  Filled 2013-05-07 (×3): qty 1

## 2013-05-07 MED ORDER — INSULIN ASPART 100 UNIT/ML ~~LOC~~ SOLN
0.0000 [IU] | Freq: Three times a day (TID) | SUBCUTANEOUS | Status: DC
Start: 2013-05-08 — End: 2013-05-10
  Administered 2013-05-08: 8 [IU] via SUBCUTANEOUS
  Administered 2013-05-08: 5 [IU] via SUBCUTANEOUS
  Administered 2013-05-08 – 2013-05-09 (×2): 3 [IU] via SUBCUTANEOUS
  Administered 2013-05-09: 5 [IU] via SUBCUTANEOUS
  Administered 2013-05-10 (×2): 3 [IU] via SUBCUTANEOUS

## 2013-05-07 MED ORDER — IPRATROPIUM-ALBUTEROL 0.5-2.5 (3) MG/3ML IN SOLN
3.0000 mL | Freq: Once | RESPIRATORY_TRACT | Status: AC
  Administered 2013-05-07: 3 mL via RESPIRATORY_TRACT
  Filled 2013-05-07: qty 3

## 2013-05-07 NOTE — H&P (Addendum)
[facility] History and Physical  [name] [name] WHQ:[id number] DOB: Jun 03, 1932 DOA: 05/07/2013  Referring physician:  PCP: [name], MD   Chief Complaint: Shortness of breath/cough  HPI: [name] [name] is a 80 y.o. female with a past medical history of cryptogenic organizing pneumonia, Wegener's granulomatosis, history of right-sided heart failure and pulmonary hypertension, who was recently discharged from the medicine service on 04/18/2013, admitted a 04/13/2013 at which time she was treated for acute decompensated congestive heart failure and a respiratory viral illness. She was discharged in stable condition on Lasix 20 mg by mouth daily. She presents to the emergency room this evening with complaints of worsening shortness of breath with associated cough with clear sputum production over the last 2 days. Her symptoms became acutely worse overnight, with inability to lay flat, having 2-3 pillow orthopnea, and developing extensive wheezing. She denies chest pain, nausea, vomiting, fevers, chills, malaise, palpitations, syncope, presyncope.  Initial lab work performed in the emergency room showed an elevated BNP of 6589 with a chest x-ray showing  findings suggesting pulmonary edema. She was administered 40 mg of IV Lasix in the emergency room, after which she reported improvement to her dyspnea. She is currently comfortable, in no respiratory distress.                                                                                       Review of Systems:  Constitutional:  No weight loss, night sweats, Fevers, chills, fatigue.  HEENT:  No headaches, Difficulty swallowing,Tooth/dental problems,Sore throat,  No sneezing, itching, ear ache, nasal congestion, post nasal drip,  Cardio-vascular:  No chest pain, Orthopnea, PND, swelling in lower extremities, anasarca, dizziness, palpitations  GI:  No heartburn, indigestion, abdominal pain, nausea, vomiting, diarrhea, change in bowel  habits, loss of appetite  Resp:  Positive for shortness of breath with exertion or at rest. No excess mucus, no productive cough, No non-productive cough, No coughing up of blood.No change in color of mucus.No wheezing.No chest wall deformity  Skin:  no rash or lesions.  GU:  no dysuria, change in color of urine, no urgency or frequency. No flank pain.  Musculoskeletal:  No joint pain or swelling. No decreased range of motion. No back pain.  Psych:  No change in mood or affect. No depression or anxiety. No memory loss.   Past Medical History  Diagnosis Date  . HYPOTHYROIDISM 07/24/2008  . HYPERLIPIDEMIA 07/24/2008  . DEPRESSION 05/16/2009  . HYPERTENSION 07/24/2008  . Vertigo   . Diabetes mellitus type II   . Arthritis     r tkr  . CVA (cerebral vascular accident) 2008    mini stroke.no residual  . Heart murmur     stress test 2009.dr peter Martinique  . Intermittent vertigo   . Skin abnormality     facial lesions .pt applying mupiracin to areas  . Atrial fibrillation   . Breast cancer   . Pneumonia   . 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 Knee surgery  2009    TKR  . Cholecystectomy  1980  . Joint replacement  r knee  . Breast surgery  2007    Lumpectomy, XRT 2006.l breast  . Video assisted thoracoscopy  04/22/2011    Procedure: VIDEO ASSISTED THORACOSCOPY;  Surgeon: Melrose Nakayama, MD;  Location: [facility];  Service: Thoracic;  Laterality: Left;  WITH BIOPSY  . Heel spur surgery Right   . Exploratory laparotomy     Social History:  reports that she quit smoking about 30 years ago. Her smoking use included Cigarettes. She has a 6 pack-year smoking history. She has never used smokeless tobacco. She reports that she does not drink alcohol or use illicit drugs.  Allergies  Allergen Reactions  . Codeine Sulfate Other (See Comments)    REACTION: GI upset  . Sulfonamide Derivatives Other (See Comments)    REACTION: GI  upset  . Atarax [Hydroxyzine] Nausea Only and Rash    Family History  Problem Relation Age of Onset  . Arthritis Mother   . Rheum arthritis Mother   . Heart disease Father   . Coronary artery disease Father   . Cancer Sister     breast CA, both sisters  . Breast cancer Sister   . Coronary artery disease Brother   . Lung cancer Brother      Prior to Admission medications   Medication Sig Start Date End Date Taking? Authorizing Provider  amLODipine-olmesartan (AZOR) 5-40 MG per tablet Take 1 tablet by mouth daily.   Yes Historical Provider, MD  apixaban (ELIQUIS) 5 MG TABS tablet Take 1 tablet (5 mg total) by mouth 2 (two) times daily. 04/15/12  Yes Peter M Martinique, MD  CALCIUM CITRATE PO Take 1,200 mg by mouth daily.     Yes Historical Provider, MD  cholecalciferol (VITAMIN D) 1000 UNITS tablet Take 3,000 Units by mouth daily.     Yes Historical Provider, MD  diazepam (VALIUM) 5 MG tablet Take 5 mg by mouth daily as needed for anxiety (and vertigo.).    Yes Historical Provider, MD  furosemide (LASIX) 20 MG tablet Take 1 tablet (20 mg total) by mouth daily. 04/28/13  Yes [name], MD  gabapentin (NEURONTIN) 100 MG capsule Take 1 capsule (100 mg total) by mouth 2 (two) times daily before a meal. 11/08/12  Yes Erick Colace, NP  Insulin Glargine (LANTUS SOLOSTAR) 100 UNIT/ML SOPN Inject 10 Units into the skin at bedtime. 03/25/13  Yes [name], MD  levothyroxine (SYNTHROID, LEVOTHROID) 125 MCG tablet Take 125 mcg by mouth daily before breakfast.   Yes Historical Provider, MD  mometasone-formoterol (DULERA) 100-5 MCG/ACT AERO 2 puffs then rinse mouth, twice daily 01/17/13  Yes Clinton D Young, MD  nebivolol (BYSTOLIC) 10 MG tablet Take 10 mg by mouth daily.   Yes Historical Provider, MD  predniSONE (DELTASONE) 20 MG tablet Take 10 mg by mouth daily with breakfast.  04/18/13  Yes Donne Hazel, MD  pyridOXINE (VITAMIN B-6) 100 MG tablet Take 100 mg by mouth every morning.   Yes  Historical Provider, MD  rosuvastatin (CRESTOR) 5 MG tablet Take 1 tablet (5 mg total) by mouth daily. 04/20/12  Yes Peter M Martinique, MD  sulfamethoxazole-trimethoprim (BACTRIM DS,SEPTRA DS) 800-160 MG per tablet Take 0.5 tablets by mouth daily. Started on 05-04-13  Once daily for 4 days   Yes Historical Provider, MD  levofloxacin (LEVAQUIN) 500 MG tablet Take 1 tablet (500 mg total) by mouth daily. 05/04/13   [name], MD  levofloxacin (LEVAQUIN) 500 MG tablet One tablet day one then one  half tablet daily for 6 days. 05/04/13   [name], MD   Physical Exam: Filed Vitals:   05/07/13 1839  BP: 172/82  Pulse:   Temp:   Resp: 23    BP 172/82  Pulse 64  Temp(Src) 97.8 F (36.6 C) (Rectal)  Resp 23  SpO2 93%  General:  Appears calm and comfortable, no acute distress. She is presently not requiring supplemental oxygen. Eyes: PERRL, normal lids, irises & conjunctiva ENT: grossly normal hearing, lips & tongue Neck: no LAD, masses or thyromegaly Cardiovascular: RRR, no m/r/g. No LE edema. Telemetry: SR, no arrhythmias  Respiratory: There are extensive by basilar crackles, rales, and diffuse expiratory wheezing. She does not appear to be in acute respiratory distress, I do not note the utilization of accessory muscles and is breathing comfortably on room air. Abdomen: soft, ntnd Skin: no rash or induration seen on limited exam Musculoskeletal: Positive by lateral lower extremity pitting edema, no cyanosis or clubbing Psychiatric: grossly normal mood and affect, speech fluent and appropriate Neurologic: grossly non-focal.          Labs on Admission:  Basic Metabolic Panel:  Recent Labs Lab 05/07/13 1525  NA 124*  K 4.9  CL 87*  CO2 24  GLUCOSE 263*  BUN 16  CREATININE 1.59*  CALCIUM 8.6   Liver Function Tests:  Recent Labs Lab 05/07/13 1525  AST 20  ALT 16  ALKPHOS 58  BILITOT 0.5  PROT 6.4  ALBUMIN 3.3*   No results found for this basename: LIPASE,  AMYLASE,  in the last 168 hours No results found for this basename: AMMONIA,  in the last 168 hours CBC:  Recent Labs Lab 05/07/13 1525  WBC 3.4*  NEUTROABS 1.7  HGB 8.2*  HCT 24.4*  MCV 90.4  PLT 204   Cardiac Enzymes:  Recent Labs Lab 05/07/13 1525  TROPONINI <0.30    BNP (last 3 results)  Recent Labs  03/07/13 1715 04/13/13 1645 05/07/13 1525  PROBNP 1156.0* 13238.0* 6589.0*   CBG: No results found for this basename: GLUCAP,  in the last 168 hours  Radiological Exams on Admission: Dg Chest 2 View  05/07/2013   CLINICAL DATA:  Cough, wheezing, history atrial fibrillation, hypertension, diabetes, former smoker, breast cancer  EXAM: CHEST  2 VIEW  COMPARISON:  04/16/2013  FINDINGS: Enlargement of cardiac silhouette with pulmonary vascular congestion.  Calcified and mildly tortuous thoracic aorta.  Mild perihilar infiltrate most likely representing pulmonary edema.  This is most prominent in the right upper lobe.  No gross pleural effusion or pneumothorax.  Bones demineralized.  IMPRESSION: Enlargement of cardiac silhouette with pulmonary vascular congestion and mild perihilar infiltrates favoring pulmonary edema.   Electronically Signed   By: Lavonia Dana M.D.   On: 05/07/2013 16:13    EKG: Independently reviewed. Atrial fibrillation  Assessment/Plan Active Problems:   Cryptogenic organizing pneumonia   CHF (congestive heart failure)   Pulmonary hypertension   Right-sided heart failure   HYPOTHYROIDISM   Type 2 diabetes mellitus   Carcinoid tumor of colon, malignant   Atrial fibrillation   Wegener's granulomatosis (granulomatosis with polyangiitis)   1. Probable acute on chronic diastolic congestive heart failure/right sided heart failure.  Most recent transthoracic echocardiogram performed on 04/14/2013 showed preserved ejection fraction of 60-65% with moderate dilatation the right ventricle and pulmonary artery peak pressure of 60 mmHg. Will start her on IV  Lasix 40 mg twice a day, continue ARB and beta blocker therapy, add a nitroglycerin, cycle  troponin's overnight. Place patient on CHF pathway, monitor ins and outs and daily weights. 2. Cryptogenic organizing pneumonia. I suspect current symptoms are most likely related to CHF rather than cryptogenic organizing pneumonia exacerbation. Will provide IV Lasix and monitor response. Meanwhile plan to continue maintenance steroids with prednisone 10 mg by mouth daily. 3. Atrial fibrillation, rate controlled. Continue beta blocker therapy with systolic 10 mg by mouth daily  As well as Eliquis. 4. Pulmonary hypertension. Patient's last transthoracic echocardiogram performed on 04/14/2013 showed pulmonary artery peak pressure of 60 mmHg. Suspect secondary to Wegener's granulomatosis. 5. Dyslipidemia. We'll continue statin therapy with Lipitor. 6. Hypertension. Patient presented with elevated blood pressures. Will add nitro paste 1 inch to anterior chest wall as well as IV lasix, meanwhile continue home regimen with diovan and bystolic. 7. Type 2 diabetes mellitus. Place patient on sliding scale coverage with Accu-Cheks q. a.c. each bedtime, continue Lantus at 10 units subcutaneous q. daily.  8. Hyponatremia. Initial lab work showing a sodium level of 124, I suspect secondary to hypervolemic state in setting of decompensated heart failure. Starting IV Lasix, repeat BMP in a.m. 9. Stage III chronic kidney disease. Patient's creatinine stable at 1.5. Will monitor as she is being administered IV diuresis.    Code Status: Full code Family Communication: I spoke with patient's husband and son present at bedside Disposition Plan: Admit to inpatient service, and his facial require greater than 2 night hospitalization  Time spent: 39 minutes  Kelvin Cellar [facility] Pager [phone]

## 2013-05-07 NOTE — ED Provider Notes (Signed)
CSN: [id number]     Arrival date & time 05/07/13  1443 History   First MD Initiated Contact with Patient 05/07/13 1510     Chief Complaint  Patient presents with  . Shortness of Breath   (Consider location/radiation/quality/duration/timing/severity/associated sxs/prior Treatment) HPI  Past Medical History  Diagnosis Date  . HYPOTHYROIDISM 07/24/2008  . HYPERLIPIDEMIA 07/24/2008  . DEPRESSION 05/16/2009  . HYPERTENSION 07/24/2008  . Vertigo   . Diabetes mellitus type II   . Arthritis     r tkr  . CVA (cerebral vascular accident) 2008    mini stroke.no residual  . Heart murmur     stress test 2009.dr peter Martinique  . Intermittent vertigo   . Skin abnormality     facial lesions .pt applying mupiracin to areas  . Atrial fibrillation   . Breast cancer   . Pneumonia   . 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 Knee surgery  2009    TKR  . Cholecystectomy  1980  . Joint replacement      r knee  . Breast surgery  2007    Lumpectomy, XRT 2006.l breast  . Video assisted thoracoscopy  04/22/2011    Procedure: VIDEO ASSISTED THORACOSCOPY;  Surgeon: Melrose Nakayama, MD;  Location: [facility];  Service: Thoracic;  Laterality: Left;  WITH BIOPSY  . Heel spur surgery Right   . Exploratory laparotomy     Family History  Problem Relation Age of Onset  . Arthritis Mother   . Rheum arthritis Mother   . Heart disease Father   . Coronary artery disease Father   . Cancer Sister     breast CA, both sisters  . Breast cancer Sister   . Coronary artery disease Brother   . Lung cancer Brother    History  Substance Use Topics  . Smoking status: Former Smoker -- 0.50 packs/day for 12 years    Types: Cigarettes    Quit date: 06/04/1982  . Smokeless tobacco: Never Used  . Alcohol Use: No   OB History   Grav Para Term Preterm Abortions TAB SAB Ect Mult Living                 Review of Systems  Allergies  Codeine sulfate; Sulfonamide  derivatives; and Atarax  Home Medications   Current Outpatient Rx  Name  Route  Sig  Dispense  Refill  . amLODipine-olmesartan (AZOR) 5-40 MG per tablet   Oral   Take 1 tablet by mouth daily.         Marland Kitchen apixaban (ELIQUIS) 5 MG TABS tablet   Oral   Take 1 tablet (5 mg total) by mouth 2 (two) times daily.   60 tablet   6   . CALCIUM CITRATE PO   Oral   Take 1,200 mg by mouth daily.           . cholecalciferol (VITAMIN D) 1000 UNITS tablet   Oral   Take 3,000 Units by mouth daily.           . diazepam (VALIUM) 5 MG tablet   Oral   Take 5 mg by mouth daily as needed for anxiety (and vertigo.).          Marland Kitchen furosemide (LASIX) 20 MG tablet   Oral   Take 1 tablet (20 mg total) by mouth daily.   90 tablet   3   . gabapentin (NEURONTIN) 100 MG capsule  Oral   Take 1 capsule (100 mg total) by mouth 2 (two) times daily before a meal.   60 capsule   6   . Insulin Glargine (LANTUS SOLOSTAR) 100 UNIT/ML SOPN   Subcutaneous   Inject 10 Units into the skin at bedtime.   6 pen   3   . levothyroxine (SYNTHROID, LEVOTHROID) 125 MCG tablet   Oral   Take 125 mcg by mouth daily before breakfast.         . mometasone-formoterol (DULERA) 100-5 MCG/ACT AERO      2 puffs then rinse mouth, twice daily         . nebivolol (BYSTOLIC) 10 MG tablet   Oral   Take 10 mg by mouth daily.         . predniSONE (DELTASONE) 20 MG tablet   Oral   Take 10 mg by mouth daily with breakfast.          . pyridOXINE (VITAMIN B-6) 100 MG tablet   Oral   Take 100 mg by mouth every morning.         . rosuvastatin (CRESTOR) 5 MG tablet   Oral   Take 1 tablet (5 mg total) by mouth daily.   30 tablet   11   . sulfamethoxazole-trimethoprim (BACTRIM DS,SEPTRA DS) 800-160 MG per tablet   Oral   Take 0.5 tablets by mouth daily. Started on 05-04-13  Once daily for 4 days         . levofloxacin (LEVAQUIN) 500 MG tablet   Oral   Take 1 tablet (500 mg total) by mouth daily.   7  tablet   0   . levofloxacin (LEVAQUIN) 500 MG tablet      One tablet day one then one half tablet daily for 6 days.   4 tablet   0    BP 159/82  Pulse 64  Temp(Src) 97.8 F (36.6 C) (Rectal)  Resp 24  SpO2 93% Physical Exam  ED Course  Procedures (including critical care time) Labs Review Labs Reviewed  CBC WITH DIFFERENTIAL - Abnormal; Notable for the following:    WBC 3.4 (*)    RBC 2.70 (*)    Hemoglobin 8.2 (*)    HCT 24.4 (*)    RDW 15.7 (*)    Monocytes Relative 15 (*)    All other components within normal limits  COMPREHENSIVE METABOLIC PANEL - Abnormal; Notable for the following:    Sodium 124 (*)    Chloride 87 (*)    Glucose, Bld 263 (*)    Creatinine, Ser 1.59 (*)    Albumin 3.3 (*)    GFR calc non Af Amer 30 (*)    GFR calc Af Amer 35 (*)    All other components within normal limits  PRO B NATRIURETIC PEPTIDE - Abnormal; Notable for the following:    Pro B Natriuretic peptide (BNP) 6589.0 (*)    All other components within normal limits  TROPONIN I   Imaging Review Dg Chest 2 View  05/07/2013   CLINICAL DATA:  Cough, wheezing, history atrial fibrillation, hypertension, diabetes, former smoker, breast cancer  EXAM: CHEST  2 VIEW  COMPARISON:  04/16/2013  FINDINGS: Enlargement of cardiac silhouette with pulmonary vascular congestion.  Calcified and mildly tortuous thoracic aorta.  Mild perihilar infiltrate most likely representing pulmonary edema.  This is most prominent in the right upper lobe.  No gross pleural effusion or pneumothorax.  Bones demineralized.  IMPRESSION: Enlargement of cardiac silhouette  with pulmonary vascular congestion and mild perihilar infiltrates favoring pulmonary edema.   Electronically Signed   By: Lavonia Dana M.D.   On: 05/07/2013 16:13    EKG Interpretation    Date/Time:  Saturday May 07 2013 15:33:12 EST Ventricular Rate:  75 PR Interval:    QRS Duration: 99 QT Interval:  427 QTC Calculation: 477 R Axis:   40 Text  Interpretation:  Atrial fibrillation Baseline wander in lead(s) V5 No significant change since last tracing Confirmed by JACUBOWITZ  MD, SAM ([id number]) on 05/07/2013 4:17:03 PM            MDM   1. CHF (congestive heart failure)       Fransico Meadow, PA-C 05/07/13 2301

## 2013-05-07 NOTE — ED Notes (Signed)
She states she saw her doctor twice this week; and was dx with u.r.i. And prescribed smz-tmp.  She states she is no better, and as of last evening, she is experiencing orthopnea and increased shortness of breath.  She then tells me she has a fib, for which she sees Dr. Martinique.  She also sees Dr. Glynn Octave for pulmonary issues.  She is pleasant, and in no distress.

## 2013-05-07 NOTE — ED Provider Notes (Signed)
Plains of cough and mild shortness of breath onset 4 days ago. No fever. Patient was slept on 2 pillows last night which improved her breathing. She treated with Bactrim however felt worse today. No chest pain no other associated symptoms. On exam alert speaks in paragraphs no distress, neck positive JVD lungs Rales at bases heart irregularly irregular abdomen obese nontender extremities without edema skin warm dry. Patient reports breathing improved after treatment with DuoNeb. Chest x-ray viewed by me  Orlie Dakin, MD 05/07/13 [phone]

## 2013-05-07 NOTE — ED Provider Notes (Signed)
CSN: [id number]     Arrival date & time 05/07/13  1443 History   First MD Initiated Contact with Patient 05/07/13 1510     Chief Complaint  Patient presents with  . Shortness of Breath   (Consider location/radiation/quality/duration/timing/severity/associated sxs/prior Treatment) Patient is a 80 y.o. female presenting with cough. The history is provided by the patient. No language interpreter was used.  Cough Cough characteristics:  Productive Sputum characteristics:  Nondescript Severity:  Moderate Onset quality:  Gradual Duration:  5 days Timing:  Constant Progression:  Worsening Chronicity:  New Smoker: no   Context: upper respiratory infection   Relieved by:  Nothing Worsened by:  Nothing tried Ineffective treatments:  None tried Associated symptoms: wheezing   Associated symptoms: no chest pain   Pt complains of a cough since Tuesday.   Pt saw Dr. Elease Hashimoto who started her on bactrim.   Pt reports increasing shortness of breath. Pt reports increased shortness of breath at night.  Pt reports 2 pillows helped last pm.  normally uses one.   Pt admitted 3 weeks ago.   Pt reports worse this time.     Past Medical History  Diagnosis Date  . HYPOTHYROIDISM 07/24/2008  . HYPERLIPIDEMIA 07/24/2008  . DEPRESSION 05/16/2009  . HYPERTENSION 07/24/2008  . Vertigo   . Diabetes mellitus type II   . Arthritis     r tkr  . CVA (cerebral vascular accident) 2008    mini stroke.no residual  . Heart murmur     stress test 2009.dr peter Martin[place]  . Intermittent vertigo   . Skin abnormality     facial lesions .pt applying mupiracin to areas  . Atrial fibrillation   . Breast cancer   . Pneumonia   . 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 Knee surgery  2009    TKR  . Cholecystectomy  1980  . Joint replacement      r knee  . Breast surgery  2007    Lumpectomy, XRT 2006.l breast  . Video assisted thoracoscopy  04/22/2011    Procedure:  VIDEO ASSISTED THORACOSCOPY;  Surgeon: Melrose Nakayama, MD;  Location: [place];  Service: Thoracic;  Laterality: Left;  WITH BIOPSY  . Heel spur surgery Right   . Exploratory laparotomy     Family History  Problem Relation Age of Onset  . Arthritis Mother   . Rheum arthritis Mother   . Heart disease Father   . Coronary artery disease Father   . Cancer Sister     breast CA, both sisters  . Breast cancer Sister   . Coronary artery disease Brother   . Lung cancer Brother    History  Substance Use Topics  . Smoking status: Former Smoker -- 0.50 packs/day for 12 years    Types: Cigarettes    Quit date: 06/04/1982  . Smokeless tobacco: Never Used  . Alcohol Use: No   OB History   Grav Para Term Preterm Abortions TAB SAB Ect Mult Living                 Review of Systems  Respiratory: Positive for cough and wheezing.   Cardiovascular: Negative for chest pain.  All other systems reviewed and are negative.    Allergies  Codeine sulfate; Sulfonamide derivatives; and Atarax  Home Medications   Current Outpatient Rx  Name  Route  Sig  Dispense  Refill  . amLODipine-olmesartan (AZOR) 5-40 MG per tablet  Oral   Take 1 tablet by mouth daily.         Marland Kitchen apixaban (ELIQUIS) 5 MG TABS tablet   Oral   Take 1 tablet (5 mg total) by mouth 2 (two) times daily.   60 tablet   6   . CALCIUM CITRATE PO   Oral   Take 1,200 mg by mouth daily.           . cholecalciferol (VITAMIN D) 1000 UNITS tablet   Oral   Take 3,000 Units by mouth daily.           . diazepam (VALIUM) 5 MG tablet   Oral   Take 5 mg by mouth daily as needed for anxiety (and vertigo.).          Marland Kitchen furosemide (LASIX) 20 MG tablet   Oral   Take 1 tablet (20 mg total) by mouth daily.   90 tablet   3   . gabapentin (NEURONTIN) 100 MG capsule   Oral   Take 1 capsule (100 mg total) by mouth 2 (two) times daily before a meal.   60 capsule   6   . Insulin Glargine (LANTUS SOLOSTAR) 100 UNIT/ML SOPN    Subcutaneous   Inject 10 Units into the skin at bedtime.   6 pen   3   . levothyroxine (SYNTHROID, LEVOTHROID) 125 MCG tablet   Oral   Take 125 mcg by mouth daily before breakfast.         . mometasone-formoterol (DULERA) 100-5 MCG/ACT AERO      2 puffs then rinse mouth, twice daily         . nebivolol (BYSTOLIC) 10 MG tablet   Oral   Take 10 mg by mouth daily.         . predniSONE (DELTASONE) 20 MG tablet   Oral   Take 10 mg by mouth daily with breakfast.          . pyridOXINE (VITAMIN B-6) 100 MG tablet   Oral   Take 100 mg by mouth every morning.         . rosuvastatin (CRESTOR) 5 MG tablet   Oral   Take 1 tablet (5 mg total) by mouth daily.   30 tablet   11   . sulfamethoxazole-trimethoprim (BACTRIM DS,SEPTRA DS) 800-160 MG per tablet   Oral   Take 0.5 tablets by mouth daily. Started on 05-04-13  Once daily for 4 days         . levofloxacin (LEVAQUIN) 500 MG tablet   Oral   Take 1 tablet (500 mg total) by mouth daily.   7 tablet   0   . levofloxacin (LEVAQUIN) 500 MG tablet      One tablet day one then one half tablet daily for 6 days.   4 tablet   0    BP 150/80  Pulse 99  Resp 27  SpO2 94% Physical Exam  Nursing note and vitals reviewed. Constitutional: She appears well-developed and well-nourished.  HENT:  Head: Normocephalic and atraumatic.  Right Ear: External ear normal.  Left Ear: External ear normal.  Nose: Nose normal.  Mouth/Throat: Oropharynx is clear and moist.  Eyes: Conjunctivae and EOM are normal. Pupils are equal, round, and reactive to light.  Neck: Normal range of motion. Neck supple.  Cardiovascular: Normal heart sounds.   Pulmonary/Chest: Effort normal. She has wheezes.  Abdominal: Soft. Bowel sounds are normal.  Musculoskeletal: Normal range of motion.  Neurological: She is  alert.  Skin: Skin is warm.  Psychiatric: She has a normal mood and affect.    ED Course  Procedures (including critical care time) Labs  Review Labs Reviewed  COMPREHENSIVE METABOLIC PANEL - Abnormal; Notable for the following:    Sodium 124 (*)    Chloride 87 (*)    Glucose, Bld 263 (*)    Creatinine, Ser 1.59 (*)    Albumin 3.3 (*)    GFR calc non Af Amer 30 (*)    GFR calc Af Amer 35 (*)    All other components within normal limits  CBC WITH DIFFERENTIAL   Imaging Review Dg Chest 2 View  05/07/2013   CLINICAL DATA:  Cough, wheezing, history atrial fibrillation, hypertension, diabetes, former smoker, breast cancer  EXAM: CHEST  2 VIEW  COMPARISON:  04/16/2013  FINDINGS: Enlargement of cardiac silhouette with pulmonary vascular congestion.  Calcified and mildly tortuous thoracic aorta.  Mild perihilar infiltrate most likely representing pulmonary edema.  This is most prominent in the right upper lobe.  No gross pleural effusion or pneumothorax.  Bones demineralized.  IMPRESSION: Enlargement of cardiac silhouette with pulmonary vascular congestion and mild perihilar infiltrates favoring pulmonary edema.   Electronically Signed   By: Lavonia Dana M.D.   On: 05/07/2013 16:13    EKG Interpretation    Date/Time:  Saturday May 07 2013 15:33:12 EST Ventricular Rate:  75 PR Interval:    QRS Duration: 99 QT Interval:  427 QTC Calculation: 477 R Axis:   40 Text Interpretation:  Atrial fibrillation Baseline wander in lead(s) V5 No significant change since last tracing Confirmed by Winfred Leeds  MD, SAM ([id number]) on 05/07/2013 4:17:03 PM            MDM Pt given duoneb with some relief,   Chest xray shows pulmonary vascular congestion with perihilar infiltrate  Pt given solumedrol and lasix Iv. BNP 6589.  Creatine is 1.59 and sodium is low at 124,  Glucose 263    Dr. Winfred Leeds in to see and examine.   Call to hospitalist to see and admit   1. CHF (congestive heart failure)      Fransico Meadow, PA-C 05/07/13 1749

## 2013-05-07 NOTE — ED Provider Notes (Signed)
Medical screening examination/treatment/procedure(s) were conducted as a shared visit with non-physician practitioner(s) and myself.  I personally evaluated the patient during the encounter.     Orlie Dakin, MD 05/07/13 2125

## 2013-05-07 NOTE — ED Notes (Signed)
Initial Contact - pt to RM4 with family.  Pt reports recent hospitalization for "too much fluid", also seen by PCP x4 days ago for "URI and he gave me antibiotics".  Pt reports DOE, and noted to be with mildly labored breathing during assessment, speaking short sentences.  LS with exp wheeze noted, audible.  Sats mid 90's on RA.  Pt denies cp/palpitations.  1+ BLE edema noted.  Pt denies n/v/d/c, abd s/nt/nd.  Neuros grossly intact.  Changed to hospital gown, placed to cardiac/02 monitor, afib on monitor.  NAD.  Awaiting EDP eval.

## 2013-05-08 DIAGNOSIS — D649 Anemia, unspecified: Secondary | ICD-10-CM

## 2013-05-08 DIAGNOSIS — N184 Chronic kidney disease, stage 4 (severe): Secondary | ICD-10-CM | POA: Diagnosis not present

## 2013-05-08 DIAGNOSIS — I5033 Acute on chronic diastolic (congestive) heart failure: Principal | ICD-10-CM

## 2013-05-08 DIAGNOSIS — I4891 Unspecified atrial fibrillation: Secondary | ICD-10-CM

## 2013-05-08 DIAGNOSIS — J84116 Cryptogenic organizing pneumonia: Secondary | ICD-10-CM | POA: Diagnosis not present

## 2013-05-08 LAB — BASIC METABOLIC PANEL
BUN: 24 mg/dL — ABNORMAL HIGH (ref 6–23)
CALCIUM: 8.9 mg/dL (ref 8.4–10.5)
CO2: 26 mEq/L (ref 19–32)
Chloride: 87 mEq/L — ABNORMAL LOW (ref 96–112)
Creatinine, Ser: 1.85 mg/dL — ABNORMAL HIGH (ref 0.50–1.10)
GFR calc non Af Amer: 25 mL/min — ABNORMAL LOW (ref >90)
GFR, EST AFRICAN AMERICAN: 29 mL/min — AB (ref >90)
GLUCOSE: 349 mg/dL — AB (ref 70–99)
Potassium: 4.1 mEq/L (ref 3.7–5.3)
Sodium: 126 mEq/L — ABNORMAL LOW (ref 137–147)

## 2013-05-08 LAB — TSH: TSH: 1.824 u[IU]/mL (ref 0.350–4.500)

## 2013-05-08 LAB — GLUCOSE, CAPILLARY
GLUCOSE-CAPILLARY: 223 mg/dL — AB (ref 70–99)
GLUCOSE-CAPILLARY: 171 mg/dL — AB (ref 70–99)
Glucose-Capillary: 366 mg/dL — ABNORMAL HIGH (ref 70–99)
Glucose-Capillary: 251 mg/dL — ABNORMAL HIGH (ref 70–99)
Glucose-Capillary: 172 mg/dL — ABNORMAL HIGH (ref 70–99)

## 2013-05-08 LAB — TROPONIN I: Troponin I: <0.3 ng/mL (ref <0.30)

## 2013-05-08 MED ORDER — ALBUTEROL SULFATE (2.5 MG/3ML) 0.083% IN NEBU
2.5000 mg | INHALATION_SOLUTION | Freq: Four times a day (QID) | RESPIRATORY_TRACT | Status: DC | PRN
  Filled 2013-05-08: qty 3

## 2013-05-08 MED ORDER — HYDRALAZINE HCL 20 MG/ML IJ SOLN: 10.0000 mg | INTRAMUSCULAR | Status: DC | PRN

## 2013-05-08 MED ORDER — SALINE SPRAY 0.65 % NA SOLN
1.0000 | NASAL | Status: DC | PRN
  Filled 2013-05-08: qty 44

## 2013-05-08 MED ORDER — OXYMETAZOLINE HCL 0.05 % NA SOLN
1.0000 | Freq: Two times a day (BID) | NASAL | Status: DC | PRN
  Filled 2013-05-08: qty 15

## 2013-05-08 MED ORDER — AMLODIPINE BESYLATE 2.5 MG PO TABS
2.5000 mg | ORAL_TABLET | Freq: Every day | ORAL | Status: DC
  Administered 2013-05-08 – 2013-05-10 (×3): 2.5 mg via ORAL
  Filled 2013-05-08 (×3): qty 1

## 2013-05-08 NOTE — Progress Notes (Addendum)
[facility] PROGRESS NOTE  [name] [name] ZOX:[id number] DOB: 02/24/1933 DOA: 05/07/2013 PCP: Kristian Covey, MD  Assessment/Plan  Acute on chronic diastolic heart failure, ECHO 04/14/2013 with EF 60-65%, moderate dilatation the right ventricle and pulmonary artery peak pressure of 60 mmHg.  Ddx also includes cryptogenic organizing pneumonia, however, given exam findings and fast improvement in sx with diuresis, feel heart failure is more likely.   -  Continue Lasix 40 mg IV BID -  Continue BB -  D/c NTG -  Tele:  Rate controlled a-fib -  Okay to d/c telemetry -  -4.2L last night -  Cont daily weights & strict I/O -  Once closer to euvolemic, plan to discharge on higher dose of lasix -  Educated about daily weights and low sodium diet today  Cryptogenic organizing pneumonia/Wegener's granulomatosis, stable, continue prednisone 10 mg by mouth daily.   Atrial fibrillation, rate controlled.  -  Continue nebivolol -  Continue eliquis  Dyslipidemia, stable, continue Lipitor  Hypertension, BP elevated -  Continue diovan and bystolic -  Continue diuresis -  Start norvasc -  Add prn hydralazine  Type 2 diabetes mellitus, A1c 7.2 03/07/13, CBGs elevated probably from solumedrol dose given in ER, but not planning to continue steroids so CBG should trend down today  -  Continue lantus 10 units -  Continue mod dose SSI today   Hyponatremia due to CHF and resolving with diuresis -  Continue lasix  Stage III/IV chronic kidney disease, baseline creatinine 1.5-1.8 -  Continue IV diuresis -  Trend creatinine  Normocytic anemia, likely due to renal parenchymal disease -  Iron studies, b12, folate  Diet:  Healthy heart, low salt, 1.2L  Access:  PIV IVF:  off Proph:  apixaban  Code Status: FULL Family Communication: patient alone  Disposition Plan: pending improvement in dyspnea   Consultants:  none  Procedures:  CXR  Antibiotics:  none   HPI/Subjective:  States  her SOB is much better today.  Still coughing some, but improved    Objective: Filed Vitals:   05/07/13 2100 05/07/13 2302 05/08/13 0650 05/08/13 0752  BP: 157/75  148/81   Pulse: 79  71   Temp: 98.1 F (36.7 C)  97.6 F (36.4 C)   TempSrc: Oral  Oral   Resp:   16   Height:      Weight:   86.2 kg (190 lb 0.6 oz)   SpO2: 97% 95% 100% 93%    Intake/Output Summary (Last 24 hours) at 05/08/13 0936 Last data filed at 05/08/13 0557  Gross per 24 hour  Intake      0 ml  Output   4200 ml  Net  -4200 ml   Filed Weights   05/07/13 1900 05/08/13 0650  Weight: 89.2 kg (196 lb 10.4 oz) 86.2 kg (190 lb 0.6 oz)    Exam:   General:  CF, No acute distress  HEENT:  NCAT, MMM  Cardiovascular:  IRRR, nl S1, S2 2/6 systolic m LSB, 2+ pulses, warm extremities  Respiratory:  Rales and wheezes throughout, no rhonchi, no increased WOB  Abdomen:   NABS, soft, NT/ND  MSK:   Normal tone and bulk, 1+ bilateral LEE  Neuro:  Grossly intact  Data Reviewed: Basic Metabolic Panel:  Recent Labs Lab 05/07/13 1525 05/08/13 0350  NA 124* 126*  K 4.9 4.1  CL 87* 87*  CO2 24 26  GLUCOSE 263* 349*  BUN 16 24*  CREATININE 1.59* 1.85*  CALCIUM 8.6  8.9   Liver Function Tests:  Recent Labs Lab 05/07/13 1525  AST 20  ALT 16  ALKPHOS 58  BILITOT 0.5  PROT 6.4  ALBUMIN 3.3*   No results found for this basename: LIPASE, AMYLASE,  in the last 168 hours No results found for this basename: AMMONIA,  in the last 168 hours CBC:  Recent Labs Lab 05/07/13 1525  WBC 3.4*  NEUTROABS 1.7  HGB 8.2*  HCT 24.4*  MCV 90.4  PLT 204   Cardiac Enzymes:  Recent Labs Lab 05/07/13 1525 05/07/13 2116 05/08/13 0330  TROPONINI <0.30 <0.30 <0.30   BNP (last 3 results)  Recent Labs  03/07/13 1715 04/13/13 1645 05/07/13 1525  PROBNP 1156.0* 13238.0* 6589.0*   CBG:  Recent Labs Lab 05/07/13 2145 05/08/13 0745  GLUCAP 366* 251*    No results found for this or any previous visit  (from the past 240 hour(s)).   Studies: Dg Chest 2 View  05/07/2013   CLINICAL DATA:  Cough, wheezing, history atrial fibrillation, hypertension, diabetes, former smoker, breast cancer  EXAM: CHEST  2 VIEW  COMPARISON:  04/16/2013  FINDINGS: Enlargement of cardiac silhouette with pulmonary vascular congestion.  Calcified and mildly tortuous thoracic aorta.  Mild perihilar infiltrate most likely representing pulmonary edema.  This is most prominent in the right upper lobe.  No gross pleural effusion or pneumothorax.  Bones demineralized.  IMPRESSION: Enlargement of cardiac silhouette with pulmonary vascular congestion and mild perihilar infiltrates favoring pulmonary edema.   Electronically Signed   By: Ulyses Southward M.D.   On: 05/07/2013 16:13    Scheduled Meds: . albuterol  2.5 mg Nebulization QID  . apixaban  5 mg Oral BID  . atorvastatin  10 mg Oral q1800  . furosemide  40 mg Intravenous BID  . gabapentin  100 mg Oral BID AC  . insulin aspart  0-15 Units Subcutaneous TID WC  . insulin aspart  0-5 Units Subcutaneous QHS  . insulin glargine  10 Units Subcutaneous QHS  . irbesartan  150 mg Oral Daily  . levothyroxine  125 mcg Oral QAC breakfast  . mometasone-formoterol  2 puff Inhalation BID  . nebivolol  10 mg Oral Daily  . nitroGLYCERIN  1 inch Topical Q6H  . predniSONE  10 mg Oral Q breakfast  . sodium chloride  3 mL Intravenous Q12H   Continuous Infusions:   Active Problems:   HYPOTHYROIDISM   Type 2 diabetes mellitus   Cryptogenic organizing pneumonia   Carcinoid tumor of colon, malignant   Atrial fibrillation   Wegener's granulomatosis (granulomatosis with polyangiitis)   CHF (congestive heart failure)   Pulmonary hypertension   Right-sided heart failure    Time spent: 30 min    Edithe Dobbin, [facility]  [facility] Pager [phone]. If 7PM-7AM, please contact night-coverage at [email or web address], password [facility] 05/08/2013, 9:36 AM  LOS: 1 day

## 2013-05-08 NOTE — ED Provider Notes (Signed)
Medical screening examination/treatment/procedure(s) were conducted as a shared visit with non-physician practitioner(s) and myself.  I personally evaluated the patient during the encounter.     Orlie Dakin, MD 05/08/13 1447

## 2013-05-09 ENCOUNTER — Inpatient Hospital Stay (HOSPITAL_COMMUNITY): Payer: Medicare Other

## 2013-05-09 DIAGNOSIS — R0989 Other specified symptoms and signs involving the circulatory and respiratory systems: Secondary | ICD-10-CM | POA: Diagnosis not present

## 2013-05-09 DIAGNOSIS — I509 Heart failure, unspecified: Secondary | ICD-10-CM | POA: Diagnosis not present

## 2013-05-09 DIAGNOSIS — I5033 Acute on chronic diastolic (congestive) heart failure: Secondary | ICD-10-CM | POA: Diagnosis not present

## 2013-05-09 DIAGNOSIS — D649 Anemia, unspecified: Secondary | ICD-10-CM

## 2013-05-09 DIAGNOSIS — I4891 Unspecified atrial fibrillation: Secondary | ICD-10-CM | POA: Diagnosis not present

## 2013-05-09 LAB — IRON AND TIBC
Iron: 47 ug/dL (ref 42–135)
Saturation Ratios: 14 % — ABNORMAL LOW (ref 20–55)
TIBC: 339 ug/dL (ref 250–470)
UIBC: 292 ug/dL (ref 125–400)

## 2013-05-09 LAB — CBC
HEMATOCRIT: 25.8 % — AB (ref 36.0–46.0)
HEMOGLOBIN: 8.5 g/dL — AB (ref 12.0–15.0)
MCH: 29.8 pg (ref 26.0–34.0)
MCHC: 32.9 g/dL (ref 30.0–36.0)
MCV: 90.5 fL (ref 78.0–100.0)
Platelets: 248 10*3/uL (ref 150–400)
RBC: 2.85 MIL/uL — ABNORMAL LOW (ref 3.87–5.11)
RDW: 15.9 % — AB (ref 11.5–15.5)
WBC: 4.8 10*3/uL (ref 4.0–10.5)

## 2013-05-09 LAB — GLUCOSE, CAPILLARY
GLUCOSE-CAPILLARY: 293 mg/dL — AB (ref 70–99)
GLUCOSE-CAPILLARY: 108 mg/dL — AB (ref 70–99)
GLUCOSE-CAPILLARY: 157 mg/dL — AB (ref 70–99)
GLUCOSE-CAPILLARY: 265 mg/dL — AB (ref 70–99)
Glucose-Capillary: 231 mg/dL — ABNORMAL HIGH (ref 70–99)

## 2013-05-09 LAB — BASIC METABOLIC PANEL
BUN: 38 mg/dL — AB (ref 6–23)
CHLORIDE: 88 meq/L — AB (ref 96–112)
CO2: 26 meq/L (ref 19–32)
CREATININE: 2.3 mg/dL — AB (ref 0.50–1.10)
Calcium: 8.7 mg/dL (ref 8.4–10.5)
GFR calc Af Amer: 22 mL/min — ABNORMAL LOW (ref >90)
GFR calc non Af Amer: 19 mL/min — ABNORMAL LOW (ref >90)
GLUCOSE: 143 mg/dL — AB (ref 70–99)
POTASSIUM: 4.1 meq/L (ref 3.7–5.3)
Sodium: 131 mEq/L — ABNORMAL LOW (ref 137–147)

## 2013-05-09 LAB — FERRITIN: FERRITIN: 99 ng/mL (ref 10–291)

## 2013-05-09 LAB — TRANSFERRIN: Transferrin: 265 mg/dL (ref 200–360)

## 2013-05-09 LAB — FOLATE RBC: RBC Folate: 1435 ng/mL — ABNORMAL HIGH (ref >280)

## 2013-05-09 LAB — VITAMIN B12: VITAMIN B 12: 822 pg/mL (ref 211–911)

## 2013-05-09 MED ORDER — ALBUTEROL SULFATE (2.5 MG/3ML) 0.083% IN NEBU
2.5000 mg | INHALATION_SOLUTION | RESPIRATORY_TRACT | Status: DC
  Administered 2013-05-09 – 2013-05-10 (×7): 2.5 mg via RESPIRATORY_TRACT
  Filled 2013-05-09 (×8): qty 3

## 2013-05-09 MED ORDER — GUAIFENESIN-DM 100-10 MG/5ML PO SYRP
5.0000 mL | ORAL_SOLUTION | ORAL | Status: DC | PRN
  Administered 2013-05-09 – 2013-05-10 (×2): 5 mL via ORAL
  Filled 2013-05-09 (×2): qty 10

## 2013-05-09 MED ORDER — PREDNISONE 50 MG PO TABS
60.0000 mg | ORAL_TABLET | Freq: Every day | ORAL | Status: DC
  Administered 2013-05-09 – 2013-05-10 (×2): 60 mg via ORAL
  Filled 2013-05-09 (×3): qty 1

## 2013-05-09 MED ORDER — SODIUM CHLORIDE 0.9 % IV SOLN
INTRAVENOUS | Status: AC
  Administered 2013-05-09: 10:00:00 via INTRAVENOUS

## 2013-05-09 MED ORDER — LEVOFLOXACIN 750 MG PO TABS
750.0000 mg | ORAL_TABLET | ORAL | Status: DC
  Administered 2013-05-09: 750 mg via ORAL
  Filled 2013-05-09: qty 1

## 2013-05-09 MED ORDER — INSULIN ASPART 100 UNIT/ML ~~LOC~~ SOLN
3.0000 [IU] | Freq: Three times a day (TID) | SUBCUTANEOUS | Status: DC
  Administered 2013-05-09 (×2): 3 [IU] via SUBCUTANEOUS

## 2013-05-09 MED ORDER — FLUTICASONE PROPIONATE 50 MCG/ACT NA SUSP
2.0000 | Freq: Every day | NASAL | Status: DC
  Administered 2013-05-09 – 2013-05-10 (×2): 2 via NASAL
  Filled 2013-05-09: qty 16

## 2013-05-09 MED ORDER — IPRATROPIUM-ALBUTEROL 0.5-2.5 (3) MG/3ML IN SOLN
3.0000 mL | RESPIRATORY_TRACT | Status: DC
  Administered 2013-05-09: 3 mL via RESPIRATORY_TRACT
  Filled 2013-05-09: qty 3

## 2013-05-09 NOTE — Progress Notes (Signed)
[facility] PROGRESS NOTE  [name] [name] MVH:[id number] DOB: [birth year] DOA: 05/07/2013 PCP: Kristian Covey, MD  Assessment/Plan  Acute on chronic diastolic heart failure, ECHO 04/14/2013 with EF 60-65%, moderate dilatation the right ventricle and pulmonary artery peak pressure of 60 mmHg.  Labs suggest borderline hypovolemia now and CXR shows improvement in vascular congestion, however, she is still hypoxic and wheezing.      -  D/c lasix -  D/c fluid restriction -  Continue BB -  -1.38L last night -  Cont daily weights & strict I/O -  Once closer to euvolemic, plan to discharge on higher dose of lasix -  Educated again about daily weights and low sodium diet today  Acute hypoxic respiratory failure with persistent wheezing.  Complaining of increased sinus congestion and pressure concerning for acute sinusitis.  I am more worried about bronchospasm from viral illness/COPD and cryptogenic organizing pneumonia/Wegener's granulomatosis at this time. -  increase prednisone to 60mg  today  -  start abx -  Tried duoneb, however, states her secretions got too thick so change to albuterol -  Nasal saline, flonase, and afrin -  Will notify pulmonology   Atrial fibrillation, rate controlled.  -  Continue nebivolol -  Continue eliquis  Dyslipidemia, stable, continue Lipitor  Hypertension, BP elevated -  Continue diovan and bystolic -  Continue diuresis -  Start norvasc -  Add prn hydralazine  Type 2 diabetes mellitus, A1c 7.2 03/07/13, monitor for hyperglycemia now that increasing steroids -  Continue lantus 10 units -  Continue mod dose SSI today -  Add aspart 3 units AC  Hyponatremia due to CHF and resolving with diuresis  Stage III/IV chronic kidney disease, baseline creatinine 1.5-1.8, now with mild AKI -  D/c lasix -  Liberalize fluids -  Start NS 19ml/h x 4 hours  Normocytic anemia, likely due to renal parenchymal disease -  Iron studies, b12, folate pending  Diet:   Healthy heart, low salt, 1.2L  Access:  PIV IVF:  off Proph:  apixaban  Code Status: FULL Family Communication: patient alone  Disposition Plan: pending improvement in dyspnea   Consultants:  none  Procedures:  CXR  Antibiotics:  levofloxacin  HPI/Subjective:  Persistent wheezing, increased nasal congestion with thick clear secretions.  Mild nausea this morning wtihout vomiting.     Objective: Filed Vitals:   05/08/13 1354 05/08/13 2303 05/09/13 0432 05/09/13 0749  BP: 119/67 109/71 117/70   Pulse: 78 83 75   Temp: 98 F (36.7 C) 97.5 F (36.4 C) 98.2 F (36.8 C)   TempSrc: Oral Oral Oral   Resp: 16 20 20    Height:      Weight:   86.3 kg (190 lb 4.1 oz)   SpO2: 94% 93% 95% 88%    Intake/Output Summary (Last 24 hours) at 05/09/13 1006 Last data filed at 05/09/13 0733  Gross per 24 hour  Intake    720 ml  Output   2450 ml  Net  -1730 ml   Filed Weights   05/07/13 1900 05/08/13 0650 05/09/13 0432  Weight: 89.2 kg (196 lb 10.4 oz) 86.2 kg (190 lb 0.6 oz) 86.3 kg (190 lb 4.1 oz)    Exam:   General:  CF, No acute distress  HEENT:  NCAT, MMM  Cardiovascular:  IRRR, nl S1, S2 2/6 systolic m LSB, 2+ pulses, warm extremities  Respiratory:  Decrease rales, but persistent full exp wheeze, + rhonchi, no increased WOB  Abdomen:   NABS, soft,  NT/ND  MSK:   Normal tone and bulk, no LEE  Neuro:  Grossly intact  Data Reviewed: Basic Metabolic Panel:  Recent Labs Lab 05/07/13 1525 05/08/13 0350 05/09/13 0418  NA 124* 126* 131*  K 4.9 4.1 4.1  CL 87* 87* 88*  CO2 24 26 26   GLUCOSE 263* 349* 143*  BUN 16 24* 38*  CREATININE 1.59* 1.85* 2.30*  CALCIUM 8.6 8.9 8.7   Liver Function Tests:  Recent Labs Lab 05/07/13 1525  AST 20  ALT 16  ALKPHOS 58  BILITOT 0.5  PROT 6.4  ALBUMIN 3.3*   No results found for this basename: LIPASE, AMYLASE,  in the last 168 hours No results found for this basename: AMMONIA,  in the last 168  hours CBC:  Recent Labs Lab 05/07/13 1525 05/09/13 0418  WBC 3.4* 4.8  NEUTROABS 1.7  --   HGB 8.2* 8.5*  HCT 24.4* 25.8*  MCV 90.4 90.5  PLT 204 248   Cardiac Enzymes:  Recent Labs Lab 05/07/13 1525 05/07/13 2116 05/08/13 0330  TROPONINI <0.30 <0.30 <0.30   BNP (last 3 results)  Recent Labs  03/07/13 1715 04/13/13 1645 05/07/13 1525  PROBNP 1156.0* 13238.0* 6589.0*   CBG:  Recent Labs Lab 05/08/13 0745 05/08/13 1155 05/08/13 1714 05/08/13 2201 05/09/13 0730  GLUCAP 251* 172* 223* 171* 108*    No results found for this or any previous visit (from the past 240 hour(s)).   Studies: Dg Chest 2 View  05/07/2013   CLINICAL DATA:  Cough, wheezing, history atrial fibrillation, hypertension, diabetes, former smoker, breast cancer  EXAM: CHEST  2 VIEW  COMPARISON:  04/16/2013  FINDINGS: Enlargement of cardiac silhouette with pulmonary vascular congestion.  Calcified and mildly tortuous thoracic aorta.  Mild perihilar infiltrate most likely representing pulmonary edema.  This is most prominent in the right upper lobe.  No gross pleural effusion or pneumothorax.  Bones demineralized.  IMPRESSION: Enlargement of cardiac silhouette with pulmonary vascular congestion and mild perihilar infiltrates favoring pulmonary edema.   Electronically Signed   By: Ulyses Southward M.D.   On: 05/07/2013 16:13   Dg Chest Port 1 View  05/09/2013   CLINICAL DATA:  Heart failure. Shortness of breath. Pulmonary edema.  EXAM: PORTABLE CHEST - 1 VIEW  COMPARISON:  05/07/2013  FINDINGS: Marked cardiopericardial enlargement. Unchanged tortuosity of the aorta. The lungs are hyperinflated chronically. There is pulmonary venous congestion without edema or effusion. No pneumothorax. No asymmetric opacity.  IMPRESSION: Pulmonary venous congestion without edema or effusion.   Electronically Signed   By: Tiburcio Pea M.D.   On: 05/09/2013 05:54    Scheduled Meds: . albuterol  2.5 mg Nebulization Q4H  .  amLODipine  2.5 mg Oral Daily  . apixaban  5 mg Oral BID  . atorvastatin  10 mg Oral q1800  . fluticasone  2 spray Each Nare Daily  . gabapentin  100 mg Oral BID AC  . insulin aspart  0-15 Units Subcutaneous TID WC  . insulin aspart  0-5 Units Subcutaneous QHS  . insulin glargine  10 Units Subcutaneous QHS  . levothyroxine  125 mcg Oral QAC breakfast  . mometasone-formoterol  2 puff Inhalation BID  . nebivolol  10 mg Oral Daily  . predniSONE  60 mg Oral Q breakfast  . sodium chloride  3 mL Intravenous Q12H   Continuous Infusions: . sodium chloride      Principal Problem:   Acute on chronic diastolic heart failure Active Problems:   HYPOTHYROIDISM  HYPONATREMIA   Type 2 diabetes mellitus   Cryptogenic organizing pneumonia   Carcinoid tumor of colon, malignant   Atrial fibrillation   Wegener's granulomatosis (granulomatosis with polyangiitis)   Pulmonary hypertension   Right-sided heart failure   Chronic kidney disease (CKD), stage IV (severe)   Normocytic anemia    Time spent: 30 min    Tomas Schamp  [facility] Pager [phone]. If 7PM-7AM, please contact night-coverage at [email or web address], password [facility] 05/09/2013, 10:06 AM  LOS: 2 days

## 2013-05-09 NOTE — Progress Notes (Signed)
ANTIBIOTIC CONSULT NOTE - INITIAL  Pharmacy Consult for Levaquin Indication: AECOPD  Allergies  Allergen Reactions  . Codeine Sulfate Other (See Comments)    REACTION: GI upset  . Sulfonamide Derivatives Other (See Comments)    REACTION: GI upset  . Atarax [Hydroxyzine] Nausea Only and Rash    Patient Measurements: Height: 5' 5" (165.1 cm) Weight: 190 lb 4.1 oz (86.3 kg) IBW/kg (Calculated) : 57   Vital Signs: Temp: 98.2 F (36.8 C) (02/02 0432) Temp src: Oral (02/02 0432) BP: 117/70 mmHg (02/02 0432) Pulse Rate: 75 (02/02 0432) Intake/Output from previous day: 02/01 0701 - 02/02 0700 In: 720 [P.O.:720] Out: 2100 [Urine:2100] Intake/Output from this shift: Total I/O In: -  Out: 350 [Urine:350]  Labs:  Recent Labs  05/07/13 1525 05/08/13 0350 05/09/13 0418  WBC 3.4*  --  4.8  HGB 8.2*  --  8.5*  PLT 204  --  248  CREATININE 1.59* 1.85* 2.30*   Estimated Creatinine Clearance: 21.9 ml/min (by C-G formula based on Cr of 2.3). No results found for this basename: VANCOTROUGH, VANCOPEAK, VANCORANDOM, GENTTROUGH, GENTPEAK, GENTRANDOM, TOBRATROUGH, TOBRAPEAK, TOBRARND, AMIKACINPEAK, AMIKACINTROU, AMIKACIN,  in the last 72 hours   Microbiology: No results found for this or any previous visit (from the past 720 hour(s)).  Medical History: Past Medical History  Diagnosis Date  . HYPOTHYROIDISM 07/24/2008  . HYPERLIPIDEMIA 07/24/2008  . DEPRESSION 05/16/2009  . HYPERTENSION 07/24/2008  . Vertigo   . Diabetes mellitus type II   . Arthritis     r tkr  . CVA (cerebral vascular accident) 2008    mini stroke.no residual  . Heart murmur     stress test 2009.dr peter [place]  . Intermittent vertigo   . Skin abnormality     facial lesions .pt applying mupiracin to areas  . Atrial fibrillation   . Breast cancer   . Pneumonia   . Wegener's disease, pulmonary 10/2012     Assessment: 41 yoF with history of COPD having acute hypoxic respiratory failure with persistent  wheezing and increased sinus congestions and pressure.  MD starting Levaquin today for possible acute sinusitis and/or acute exacerbation of COPD.  WBC 4.8  Afebrile  SCr increasing, now 2.30.  CrCl~27 ml/min (CG)  No microbiology data.  Goal of Therapy:  Eradication of infection; doses adjusted per renal clearance  Plan:  1.  Levaquin 750 mg PO q48h. 2.  F/u SCr, clinical course.  Hershal Coria 05/09/2013,10:26 AM

## 2013-05-09 NOTE — Evaluation (Signed)
Physical Therapy Evaluation Patient Details Name: [name] [name] MRN: [id number] DOB: 1932-11-12 Today's Date: 05/09/2013 Time: 2585-2778 PT Time Calculation (min): 23 min  PT Assessment / Plan / Recommendation History of Present Illness  80 y.o. female with  h/o Wgener's granulomatosis, atrial fibrillation, DM, HTN, breast cancer in remission, metastatic intestinal carcinoid, vertigo admitted wtih SOB.   Clinical Impression  *Pt admitted with CHF*. Pt currently with functional limitations due to the deficits listed below (see PT Problem List).  Pt will benefit from skilled PT to increase their independence and safety with mobility to allow discharge to the venue listed below.   **    PT Assessment  Patient needs continued PT services    Follow Up Recommendations  Home health PT    Does the patient have the potential to tolerate intense rehabilitation      Barriers to Discharge        Equipment Recommendations  None recommended by PT    Recommendations for Other Services     Frequency Min 3X/week    Precautions / Restrictions Precautions Precautions: Fall Precaution Comments: hx vertigo, monitor O2 Restrictions Weight Bearing Restrictions: No   Pertinent Vitals/Pain *SaO2 85% on RA walking SaO2 97% on 2L O2 [place] at rest  Pt reports chest soreness with coughing. Instructed pt to brace with pillow.**      Mobility  Bed Mobility Overal bed mobility: Modified Independent Transfers Overall transfer level: Needs assistance Equipment used: Rolling walker (2 wheeled) Transfers: Sit to/from [facility] Sit to Stand: Min guard Stand pivot transfers: Min guard General transfer comment: min/guard for safety 2* vertigo, UE support required for SPT Ambulation/Gait Ambulation/Gait assistance: Min guard Ambulation Distance (Feet): 20 Feet Assistive device: Rolling walker (2 wheeled) Gait Pattern/deviations: Decreased step length - right;Decreased step length -  left Gait velocity: decr General Gait Details: distance limited by fatigue, SOB, vertigo. SaO2 dropped to 85% on RA while walking. Applied O2, up to 97% on 2L at rest.    Exercises     PT Diagnosis: Difficulty walking  PT Problem List: Decreased activity tolerance;Decreased balance;Decreased mobility PT Treatment Interventions: DME instruction;Gait training;Functional mobility training;Therapeutic exercise;Therapeutic activities;Patient/family education;Neuromuscular re-education;Balance training     PT Goals(Current goals can be found in the care plan section) Acute Rehab PT Goals Patient Stated Goal: would like to return home with HHPT PT Goal Formulation: With patient Time For Goal Achievement: 05/02/13 Potential to Achieve Goals: Good  Visit Information  Last PT Received On: 05/09/13 Assistance Needed: +1 History of Present Illness: 80 y.o. female with  h/o Wgener's granulomatosis, atrial fibrillation, DM, HTN, breast cancer in remission, metastatic intestinal carcinoid, vertigo admitted wtih SOB.        Prior [place] expects to be discharged to:: Private residence Living Arrangements: Spouse/significant other Available Help at Discharge: Family Type of Home: House Home Access: Stairs to enter Technical brewer of Steps: 1 Home Layout: One level Home Equipment: Environmental consultant - 2 wheels;Cane - single point;Grab bars - toilet;Grab bars - tub/shower;Shower seat Prior Function Level of Independence: Independent with assistive device(s) Comments: pt reports RW in household and w/c for community Communication Communication: No difficulties    Cognition  Cognition Arousal/Alertness: Awake/alert Behavior During Therapy: WFL for tasks assessed/performed Overall Cognitive Status: Within Functional Limits for tasks assessed    Extremity/Trunk Assessment Upper Extremity Assessment Upper Extremity Assessment: Overall WFL for tasks assessed Lower  Extremity Assessment Lower Extremity Assessment: Overall WFL for tasks assessed Cervical / Trunk Assessment  Cervical / Trunk Assessment: Normal   Balance    End of Session PT - End of Session Equipment Utilized During Treatment: Gait belt;Oxygen Activity Tolerance: Patient limited by fatigue Patient left: with call bell/phone within reach;with family/visitor present;in chair (sitting EOB with nsg present) Nurse Communication: Mobility status  GP     Blondell Reveal Kistler 05/09/2013, 1:17 PM [phone]

## 2013-05-09 NOTE — Progress Notes (Signed)
Received referral for [facility] Care Management services from inpatient RNCM. Will follow up with patient at bedside and offer [place] Management services. [name], MSN, [facility] [phone]

## 2013-05-09 NOTE — Care Management Note (Addendum)
Page 1 of 2   05/10/2013     4:28:43 PM   CARE MANAGEMENT NOTE 05/10/2013  Patient:  [name] [name], [name] [name]   Account Number:  [id number]  Date Initiated:  05/09/2013  Documentation initiated by:  [facility]  Subjective/Objective Assessment:   80 Y/O F ADMITTED W/CHF.READMIT-1/7-1/12/15-CHF.     Action/Plan:   FROM HOME W/SPOUSE.ACTIVE W/[facility]-HHRN/PT.[facility] FOLLOWING.   Anticipated DC Date:  05/12/2013   Anticipated DC Plan:  [place]  CM consult      Choice offered to / List presented to:  C-1 Patient   DME arranged  NEBULIZER MACHINE      DME agency  [place] arranged  HH-1 RN  HH-2 PT      [facility] agency  [facility]   Status of service:  Completed, signed off Medicare Important Message given?   (If response is "NO", the following Medicare IM given date fields will be blank) Date Medicare IM given:   Date Additional Medicare IM given:    Discharge Disposition:  [place]  Per UR Regulation:  Reviewed for med. necessity/level of care/duration of stay  If discussed at Long Length of Stay Meetings, dates discussed:    Comments:  05/10/13 Mahaila Tischer RN,BSN NCM [phone].[facility] DME REP AWARE-THEY DO NOT PROVIDE MEDICINE.PATIENT HAS SCRIPT FOR ALBUTEROL.NEB MACHINE WILL BE BROUGHT TO RM. [facility] FOLLOWING FOR HHC,& [facility],IF NEBULIZER NEEDED WILL NEED ORDER.AWAIT FINAL HHC,DME ORDERS.  05/09/13 Dene Landsberg RN,BSN NCM [place] W/[facility] SPOKE TO [facility] REP FOLLOWING FOR HHRN/PT.AWAIT FINAL HHC ORDERS.PATIENT AGREEABLE TO [facility].

## 2013-05-10 DIAGNOSIS — J441 Chronic obstructive pulmonary disease with (acute) exacerbation: Secondary | ICD-10-CM

## 2013-05-10 DIAGNOSIS — J9600 Acute respiratory failure, unspecified whether with hypoxia or hypercapnia: Secondary | ICD-10-CM | POA: Diagnosis not present

## 2013-05-10 DIAGNOSIS — I5033 Acute on chronic diastolic (congestive) heart failure: Secondary | ICD-10-CM | POA: Diagnosis not present

## 2013-05-10 LAB — BASIC METABOLIC PANEL
BUN: 39 mg/dL — ABNORMAL HIGH (ref 6–23)
CALCIUM: 8.7 mg/dL (ref 8.4–10.5)
CO2: 24 meq/L (ref 19–32)
CREATININE: 1.96 mg/dL — AB (ref 0.50–1.10)
Chloride: 88 mEq/L — ABNORMAL LOW (ref 96–112)
GFR calc Af Amer: 27 mL/min — ABNORMAL LOW (ref >90)
GFR calc non Af Amer: 23 mL/min — ABNORMAL LOW (ref >90)
GLUCOSE: 186 mg/dL — AB (ref 70–99)
Potassium: 4.4 mEq/L (ref 3.7–5.3)
Sodium: 129 mEq/L — ABNORMAL LOW (ref 137–147)

## 2013-05-10 LAB — GLUCOSE, CAPILLARY: GLUCOSE-CAPILLARY: 161 mg/dL — AB (ref 70–99)

## 2013-05-10 MED ORDER — ALBUTEROL SULFATE (2.5 MG/3ML) 0.083% IN NEBU: 2.5000 mg | INHALATION_SOLUTION | Freq: Four times a day (QID) | RESPIRATORY_TRACT | Status: DC | PRN

## 2013-05-10 MED ORDER — INSULIN ASPART 100 UNIT/ML ~~LOC~~ SOLN
6.0000 [IU] | Freq: Three times a day (TID) | SUBCUTANEOUS | Status: DC
  Administered 2013-05-10 (×2): 6 [IU] via SUBCUTANEOUS

## 2013-05-10 MED ORDER — INSULIN ASPART 100 UNIT/ML FLEXPEN: PEN_INJECTOR | SUBCUTANEOUS | Status: DC

## 2013-05-10 MED ORDER — OXYMETAZOLINE HCL 0.05 % NA SOLN: 1.0000 | Freq: Two times a day (BID) | NASAL | Status: DC | PRN

## 2013-05-10 MED ORDER — GUAIFENESIN ER 600 MG PO TB12: 600.0000 mg | ORAL_TABLET | Freq: Two times a day (BID) | ORAL | Status: DC

## 2013-05-10 MED ORDER — PREDNISONE 20 MG PO TABS: ORAL_TABLET | ORAL | Status: DC

## 2013-05-10 MED ORDER — FLUTICASONE PROPIONATE 50 MCG/ACT NA SUSP: 2.0000 | Freq: Every day | NASAL | Status: DC

## 2013-05-10 MED ORDER — FERROUS SULFATE 325 (65 FE) MG PO TABS
325.0000 mg | ORAL_TABLET | Freq: Every day | ORAL | Status: DC
  Filled 2013-05-10: qty 1

## 2013-05-10 MED ORDER — SALINE SPRAY 0.65 % NA SOLN: 1.0000 | NASAL | Status: DC | PRN

## 2013-05-10 MED ORDER — FERROUS SULFATE 325 (65 FE) MG PO TABS: 325.0000 mg | ORAL_TABLET | Freq: Every day | ORAL | Status: DC

## 2013-05-10 NOTE — Evaluation (Signed)
Occupational Therapy Evaluation Patient Details Name: SHYA KOVATCH MRN: [id number] DOB: April 15, 1932 Today's Date: 05/10/2013 Time: 7530-0511 OT Time Calculation (min): 18 min  OT Assessment / Plan / Recommendation History of present illness 80 y.o. female with  h/o Wgener's granulomatosis, atrial fibrillation, DM, HTN, breast cancer in remission, metastatic intestinal carcinoid, vertigo admitted wtih SOB.    Clinical Impression   Pt overall at min guard assist level with ADL. Will benefit from acute OT to increase independence with self care tasks and feel she will do ok to d/c home with spouse assist.     OT Assessment  Patient needs continued OT Services    Follow Up Recommendations  No OT follow up;Supervision/Assistance - 24 hour    Barriers to Discharge      Equipment Recommendations  None recommended by OT    Recommendations for Other Services    Frequency  Min 2X/week    Precautions / Restrictions Precautions Precautions: Fall Precaution Comments: hx vertigo, monitor O2 Restrictions Weight Bearing Restrictions: No   Pertinent Vitals/Pain 97% on RA after activity No complaint of pain    ADL  Eating/Feeding: Independent Where Assessed - Eating/Feeding: Chair Grooming: Performed;Teeth care;Set up Where Assessed - Grooming: Supported sitting Upper Body Bathing: Simulated;Chest;Right arm;Left arm;Abdomen;Set up Where Assessed - Upper Body Bathing: Unsupported sitting Lower Body Bathing: Simulated;Min guard Where Assessed - Lower Body Bathing: Supported sit to stand Upper Body Dressing: Simulated;Set up Where Assessed - Upper Body Dressing: Unsupported sitting Lower Body Dressing: Simulated;Min guard Where Assessed - Lower Body Dressing: Supported sit to stand Toilet Transfer: Performed;Min guard Armed forces technical officer Method: Arts development officer: Therapist, occupational and Hygiene: Performed;Min guard Where Assessed -  Best boy and Hygiene: Sit to stand from 3-in-1 or toilet ADL Comments: Pt on BSC when OT arrived with nursing and nursing tech present. Assisted to stand from [facility] to perform hygiene and pivot around to chair without device. Pt states she has history of vertigo and has to be cautious with activity. Discussed still having assist with in and out of tub for safety and use of seat initially for safety.   97% sats on RA and nursing present and states ok to leave O2 off.    OT Diagnosis: Generalized weakness  OT Problem List: Decreased strength;Decreased knowledge of use of DME or AE OT Treatment Interventions: Self-care/ADL training;DME and/or AE instruction;Therapeutic activities;Patient/family education   OT Goals(Current goals can be found in the care plan section) Acute Rehab OT Goals Patient Stated Goal: return home OT Goal Formulation: With patient Time For Goal Achievement: 05/24/13 Potential to Achieve Goals: Good  Visit Information  Last OT Received On: 05/10/13 Assistance Needed: +1 History of Present Illness: 80 y.o. female with  h/o Wgener's granulomatosis, atrial fibrillation, DM, HTN, breast cancer in remission, metastatic intestinal carcinoid, vertigo admitted wtih SOB.        Prior [place] expects to be discharged to:: Private residence Living Arrangements: Spouse/significant other Available Help at Discharge: Family Type of Home: House Home Access: Stairs to enter Technical brewer of Steps: 1 Home Layout: One level Home Equipment: Environmental consultant - 2 wheels;Cane - single point;Grab bars - toilet;Grab bars - tub/shower;Shower seat Prior Function Level of Independence: Independent with assistive device(s) Comments: pt reports RW in household and w/c for community Communication Communication: No difficulties         Vision/Perception     Cognition  Cognition Arousal/Alertness: Awake/alert Behavior During  Therapy: WFL for tasks assessed/performed Overall Cognitive Status: Within Functional Limits for tasks assessed    Extremity/Trunk Assessment Upper Extremity Assessment Upper Extremity Assessment: Overall WFL for tasks assessed     Mobility Transfers Overall transfer level: Needs assistance Equipment used: Rolling walker (2 wheeled) Transfers: Sit to/from Stand Sit to Stand: Min guard Stand pivot transfers: Min guard General transfer comment: min guard as she was alittle unsteady with second sit to stand from recliner.      Exercise     Balance General Comments General comments (skin integrity, edema, etc.): pt able to stand and perform hygiene with min guard.   End of Session OT - End of Session Activity Tolerance: Patient tolerated treatment well Patient left: in chair;with call bell/phone within reach  [place], [place] [phone] 05/10/2013, 10:05 AM

## 2013-05-10 NOTE — Discharge Summary (Addendum)
Physician Discharge Summary  BRINYA HEBERLEIN ONG:[id number] DOB: 03-18-1933 DOA: 05/07/2013  PCP: Kristian Covey, MD  Admit date: 05/07/2013 Discharge date: 05/10/2013  Recommendations for Outpatient Follow-up:  1. Follow up with pulmonology, Dr. Maple Hudson, within 1-2 weeks of discharge 2. Follow up with primary care doctor within 1-2 weeks of discharge.  Please repeat BMP to follow up hyponatremia and CBC for anemia.  Please review CBG and adjust insulin prn.    Discharge Diagnoses:  Principal Problem:   COPD with acute exacerbation Active Problems:   HYPOTHYROIDISM   HYPONATREMIA   Type 2 diabetes mellitus   Cryptogenic organizing pneumonia   Carcinoid tumor of colon, malignant   Atrial fibrillation   Wegener's granulomatosis (granulomatosis with polyangiitis)   Acute on chronic diastolic heart failure   Pulmonary hypertension   Right-sided heart failure   Chronic kidney disease (CKD), stage IV (severe)   Normocytic anemia   Discharge Condition: stable, improved  Diet recommendation:  Diabetic diet  Wt Readings from Last 3 Encounters:  05/10/13 86.2 kg (190 lb 0.6 oz)  04/28/13 90.719 kg (200 lb)  04/22/13 90.719 kg (200 lb)    History of present illness:  [name] [name] is a 80 y.o. female with a past medical history of cryptogenic organizing pneumonia, Wegener's granulomatosis, history of right-sided heart failure and pulmonary hypertension, who was recently discharged from the medicine service on 04/18/2013, admitted a 04/13/2013 at which time she was treated for acute decompensated congestive heart failure and a respiratory viral illness. She was discharged in stable condition on Lasix 20 mg by mouth daily. She presents to the emergency room this evening with complaints of worsening shortness of breath with associated cough with clear sputum production over the last 2 days. Her symptoms became acutely worse overnight, with inability to lay flat, having 2-3 pillow orthopnea, and  developing extensive wheezing. She denies chest pain, nausea, vomiting, fevers, chills, malaise, palpitations, syncope, presyncope. Initial lab work performed in the emergency room showed an elevated BNP of 6589 with a chest x-ray showing findings suggesting pulmonary edema. She was administered 40 mg of IV Lasix in the emergency room, after which she reported improvement to her dyspnea. She is currently comfortable, in no respiratory distress.   Hospital Course:   Acute hypoxic respiratory failure was likely primarily due to acute COPD exacerbation triggered by URI and post-nasal gtt, although, initially, she was treated for acute on chronic diastolic heart failure because her CXR demonstrated vascular congestion.   She was diuresed with IV lasix and lost approximately 5L of fluid.  Her ECHO from 04/14/2013 demonstrated EF 60-65%, moderate dilatation the right ventricle and pulmonary artery peak pressure of 60 mmHg.  Her CXR demonstrated improvement, however, clinically, she had minimal improvement in her wheezing and hypoxia.  She developed some mild AKI and her diuretics were held and she was given IVF.  She was started on higher dose steroids for treatment of COPD exacerbation (60mg  daily) and given BD and empiric antibiotics, although her CXR did not suggest PNA.  Her remained afebrile.  She was also given flonase, nasal saline, and afrin, which helped her post-nasal gtt.  Her breathing quickly improved.  She should continue a steroid taper with breathing treatments and antibiotics at home and follow up with her pulmonologist in 1-2 weeks.  I emailed Dr. Maple Hudson about her admission so he would be aware of her follow up appointment.    Cyptogenic organizing pneumonia/Wegener's granulomatosis:  Plan to taper steroids back to her previous  dose of 10mg  daily.  Further management by pulmology.  Atrial fibrillation, rate controlled.  Continued nebivolol and eliquis.   Dyslipidemia, stable, continued Lipitor    Hypertension, BP elevated.  Continued diovan and bystolic and started norvasc.    Type 2 diabetes mellitus, A1c 7.2 03/07/13, monitored for hyperglycemia due to steroids -  Given Rx for SSI aspart to use at meal time while tapering steroids.    Hyponatremia due to CHF and improved with diuresis.    Stage III/IV chronic kidney disease, baseline creatinine 1.5-1.8 which trended up to 2.3 with diuresis and back down to 1.96 with IVF and holding lasix.    Normocytic anemia, likely due to renal parenchymal disease and chronic inflammation, however, she may have a slight component of iron deficiency.  Iron studies mixed.  Iron level 47 (borderline low) and saturation 14, however, ferritin 99 and transferrin 292.  Will start once daily iron to see if this improves her anemia.  Her b12 was 822 and folate was 1435.  Follow up with PCP for ongoing monitoring.     Consultants:  none Procedures:  CXR Antibiotics:  Levofloxacin 2/2 >>   Discharge Exam: Filed Vitals:   05/10/13 1350  BP: 122/53  Pulse: 86  Temp: 98 F (36.7 C)  Resp: 20   Filed Vitals:   05/10/13 0945 05/10/13 1155 05/10/13 1350 05/10/13 1428  BP:   122/53   Pulse:   86   Temp:   98 F (36.7 C)   TempSrc:   Oral   Resp:   20   Height:      Weight:      SpO2: 97% 97% 98% 93%    General: CF, No acute distress  HEENT: NCAT, MMM  Cardiovascular: IRRR, nl S1, S2 2/6 systolic m LSB, 2+ pulses, warm extremities  Respiratory:  Rales throughout and worse at bases, persistent full exp wheeze, + rhonchi, no increased WOB  Abdomen: NABS, soft, NT/ND  MSK: Normal tone and bulk, no LEE Neuro: Grossly intact   Discharge Instructions      Discharge Orders   Future Appointments Provider Department Dept Phone   05/24/2013 10:45 AM Waymon Budge, MD [facility] [phone]   06/20/2013 9:00 AM [facility] [phone]   Please wear two piece clothing and wear no  powder or deodorant. Please arrive 15 minutes early prior to your appointment time.   07/19/2013 10:30 AM Waymon Budge, MD [facility] [phone]   07/28/2013 10:30 AM Kristian Covey, MD [facility] [phone]   09/12/2013 10:00 AM [facility] [phone]   09/12/2013 11:00 AM [facility]Ct [facility] [phone]   Liquids only 4 hours prior to your exam. Any medications can be taken as usual. Please arrive 15 min prior to your scheduled exam time.   09/15/2013 9:00 AM Lowella Dell, MD [facility] [phone]   02/09/2014 10:15 AM [facility] [phone]   02/09/2014 10:45 AM Amy Allegra Grana, Cordelia Poche [facility] [phone]   Future Orders Complete By Expires   (HEART FAILURE PATIENTS) Call MD:  Anytime you have any of the following symptoms: 1) 3 pound weight gain in 24 hours or 5 pounds in 1 week 2) shortness of breath, with or without a dry hacking cough 3) swelling in the  hands, feet or stomach 4) if you have to sleep on extra pillows at night in order to breathe.  As directed    Call MD for:  difficulty breathing, headache or visual disturbances  As directed    Call MD for:  extreme fatigue  As directed    Call MD for:  hives  As directed    Call MD for:  persistant dizziness or light-headedness  As directed    Call MD for:  persistant nausea and vomiting  As directed    Call MD for:  severe uncontrolled pain  As directed    Call MD for:  temperature >100.4  As directed    Consult to care management  As directed    Comments:     Nebulizer, home health equipment   Diet Carb Modified  As directed    Discharge instructions  As directed    Comments:     You were hospitalized with shortness of breath.  You were treated for diastolic heart failure with lasix and your breathing  only improved slightly.  You were then started on antibiotics and steroids and breathing treatments with helped.  Please continue your levofloxacin at home as previously prescribed by your primary care doctor.  Please take a tapering dose of steroids for the next week and follow up with Dr. Maple Hudson at your already scheduled appointment.  I have arranged for a home nebulizer with albuterol. Your sinuses may be contributing to your shortness of breath.  Please continue to use your flonase and nasal saline. For the next 3 days, you may also use afrin as needed, but do not continue to use this medication any longer as it can lead to chronically stuffy nose.  Because steroids can cause your blood sugars to rise, please check your blood sugars 4 times a day before breakfast, lunch, dinner, and bed.  Use Janice Seales-acting aspart insulin per the sliding scale provided.  Your blood sugars may be very high the next few days, but should trend down as your steroids are tapered.  If you have blood sugars > 350 or < 70, please talk to your primary care doctor about adjusting the dose of your insulin before your follow up appointment.   DME Nebulizer/meds  As directed    Increase activity slowly  As directed        Medication List    STOP taking these medications       sulfamethoxazole-trimethoprim 800-160 MG per tablet  Commonly known as:  BACTRIM DS,SEPTRA DS      TAKE these medications       albuterol (2.5 MG/3ML) 0.083% nebulizer solution  Commonly known as:  PROVENTIL  Take 3 mLs (2.5 mg total) by nebulization every 6 (six) hours as needed for wheezing or shortness of breath.     apixaban 5 MG Tabs tablet  Commonly known as:  ELIQUIS  Take 1 tablet (5 mg total) by mouth 2 (two) times daily.     AZOR 5-40 MG per tablet  Generic drug:  amLODipine-olmesartan  Take 1 tablet by mouth daily.     CALCIUM CITRATE PO  Take 1,200 mg by mouth daily.     cholecalciferol 1000 UNITS tablet  Commonly known as:   VITAMIN D  Take 3,000 Units by mouth daily.     diazepam 5 MG tablet  Commonly known as:  VALIUM  Take 5 mg by mouth daily as needed for anxiety (and vertigo.).     ferrous sulfate 325 (  65 FE) MG tablet  Take 1 tablet (325 mg total) by mouth daily with breakfast.  Start taking on:  05/11/2013     fluticasone 50 MCG/ACT nasal spray  Commonly known as:  FLONASE  Place 2 sprays into both nostrils daily.     furosemide 20 MG tablet  Commonly known as:  LASIX  Take 1 tablet (20 mg total) by mouth daily.     gabapentin 100 MG capsule  Commonly known as:  NEURONTIN  Take 1 capsule (100 mg total) by mouth 2 (two) times daily before a meal.     guaiFENesin 600 MG 12 hr tablet  Commonly known as:  MUCINEX  Take 1 tablet (600 mg total) by mouth 2 (two) times daily.     insulin aspart 100 UNIT/ML FlexPen  Commonly known as:  NOVOLOG  Inject 3-18 units subcutaneously per sliding scale TID AC.     Insulin Glargine 100 UNIT/ML Solostar Pen  Commonly known as:  LANTUS SOLOSTAR  Inject 10 Units into the skin at bedtime.     levofloxacin 500 MG tablet  Commonly known as:  LEVAQUIN  Take 1 tablet (500 mg total) by mouth daily.     levothyroxine 125 MCG tablet  Commonly known as:  SYNTHROID, LEVOTHROID  Take 125 mcg by mouth daily before breakfast.     mometasone-formoterol 100-5 MCG/ACT Aero  Commonly known as:  DULERA  2 puffs then rinse mouth, twice daily     nebivolol 10 MG tablet  Commonly known as:  BYSTOLIC  Take 10 mg by mouth daily.     oxymetazoline 0.05 % nasal spray  Commonly known as:  AFRIN  Place 1 spray into both nostrils 2 (two) times daily as needed for congestion. Do not use for more than 3 days     predniSONE 20 MG tablet  Commonly known as:  DELTASONE  Take 3 tabs daily for 3 days, then 2 tabs daily for 3 days, then 1 tab daily for 3 days, then resume half tab daily     pyridOXINE 100 MG tablet  Commonly known as:  VITAMIN B-6  Take 100 mg by mouth every  morning.     rosuvastatin 5 MG tablet  Commonly known as:  CRESTOR  Take 1 tablet (5 mg total) by mouth daily.     sodium chloride 0.65 % Soln nasal spray  Commonly known as:  OCEAN  Place 1 spray into both nostrils as needed for congestion.       Follow-up Information   Follow up with Waymon Budge, MD On 05/24/2013. (10:45 AM)    Specialty:  Pulmonary Disease   Contact information:   [address]  [facility] [place] [address] [phone]       Follow up with Kristian Covey, MD. Schedule an appointment as soon as possible for a visit in 2 weeks.   Specialty:  Family Medicine   Contact information:   [address] [place] [address] [phone]        The results of significant diagnostics from this hospitalization (including imaging, microbiology, ancillary and laboratory) are listed below for reference.    Significant Diagnostic Studies: Dg Chest 2 View  05/07/2013   CLINICAL DATA:  Cough, wheezing, history atrial fibrillation, hypertension, diabetes, former smoker, breast cancer  EXAM: CHEST  2 VIEW  COMPARISON:  04/16/2013  FINDINGS: Enlargement of cardiac silhouette with pulmonary vascular congestion.  Calcified and mildly tortuous thoracic aorta.  Mild perihilar infiltrate  most likely representing pulmonary edema.  This is most prominent in the right upper lobe.  No gross pleural effusion or pneumothorax.  Bones demineralized.  IMPRESSION: Enlargement of cardiac silhouette with pulmonary vascular congestion and mild perihilar infiltrates favoring pulmonary edema.   Electronically Signed   By: Ulyses Southward M.D.   On: 05/07/2013 16:13   Dg Chest 2 View  04/13/2013   CLINICAL DATA:  Productive cough and shortness of breath for 2 days.  EXAM: CHEST  2 VIEW  COMPARISON:  PA and lateral chest 12/23/2012.  FINDINGS: There is pulmonary vascular congestion and marked cardiomegaly, unchanged. No consolidative process, pneumothorax or effusion.   IMPRESSION: Cardiomegaly and vascular congestion.  No acute process.   Electronically Signed   By: Drusilla Kanner M.D.   On: 04/13/2013 14:45   Ct Chest Wo Contrast  04/14/2013   CLINICAL DATA:  Wegener's granulomatosis with abnormal chest x-ray. Cough.  EXAM: CT CHEST WITHOUT CONTRAST  TECHNIQUE: Multidetector CT imaging of the chest was performed following the standard protocol without IV contrast.  COMPARISON:  Chest CT 11/01/2012.  FINDINGS: The chest wall is unremarkable and stable. No breast masses, supraclavicular or axillary adenopathy. Small scattered lymph nodes are noted.  The bony thorax is intact. No destructive bone lesions or spinal canal compromise. Moderate osteoporosis.  The heart is enlarged but stable. Three-vessel coronary artery calcifications are noted. No mediastinal or hilar mass or adenopathy. The pulmonary arteries are mildly enlarged. The aorta is normal in caliber. Mild tortuosity and calcification. The esophagus is grossly normal. There is a small hiatal hernia.  Examination of the lung parenchyma demonstrates patchy D ground-glass opacity and the age nodularity. This suggests a partial airspace filling process and could be pulmonary edema, pulmonary hemorrhage or infectious or inflammatory alveolitis. No focal airspace consolidation to suggest pneumonia. No worrisome pulmonary lesions. No pleural effusion. No bronchiectasis. There is a calcified granuloma noted in the right lower lobe.  The upper abdomen is grossly normal. There appears to be a the new cystic area in the pancreatic tail/ body junction region. This is incompletely evaluated. A followup CT scan with contrast (if possible) using pancreatic protocol is suggested. If the patient cannot have IV contrast an MRI of the abdomen without contrast may be more helpful.  IMPRESSION: 1. Stable cardiac enlargement and coronary artery calcifications. 2. Patchy ground-glass opacity in both lungs with vague nodularity. Findings  suggest a partial airspace filling process such as edema, hemorrhage or inflammatory or infectious alveolitis. No focal airspace consolidation. 3. Possible new cystic lesion in the pancreatic body/tail junction region. Please see above discussion.   Electronically Signed   By: Loralie Champagne M.D.   On: 04/14/2013 10:56   Mr Abdomen W Wo Contrast  04/18/2013   CLINICAL DATA:  Evaluate potential cystic lesion in the pancreas.  EXAM: MRI ABDOMEN WITHOUT AND WITH CONTRAST  TECHNIQUE: Multiplanar multisequence MR imaging of the abdomen was performed both before and after the administration of intravenous contrast.  CONTRAST:  9mL MULTIHANCE GADOBENATE DIMEGLUMINE 529 MG/ML IV SOLN  COMPARISON:  CT of the chest 04/14/2013. PET-CT 05/21/2011. CT of the abdomen and pelvis with contrast 03/06/2009.  FINDINGS: Study is severely limited by a large amount of respiratory motion and gross patient motion, which limits assessment of many of the pulse sequences) several of which are essentially nondiagnostic). With these limitations in mind, there are innumerable small lesions scattered throughout the pancreatic parenchyma which appear to be low signal intensity on T1 weighted images,  high signal intensity on T2 weighted images, without definitive enhancement on post gadolinium images (although many of the post gadolinium images are essentially nondiagnostic), favored to represent multiple small pseudocysts. The largest of these is in the body of the pancreas measuring approximately 2.5 x 2.0 cm (image 31 of series 3). No definite associated pancreatic ductal dilatation.  Perinephric fluid around both kidneys (right greater than left) is nonspecific, and can be seen in the setting of renal insufficiency. The liver has slight nodularity to its contour, which may suggest early changes of cirrhosis. No definite focal hepatic lesions are identified. No intra or extrahepatic biliary ductal dilatation. The appearance of the spleen and  bilateral adrenal glands is unremarkable. Status post cholecystectomy. Visualized portions of the heart are clearly enlarged.  IMPRESSION: 1. Severely limited examination secondary to large amount of patient respiratory motion. Within the limits of today's examination, the findings in the pancreas are favored to represent sequela of chronic pancreatitis. Specifically, there are numerous cystic-appearing pancreatic lesions which likely represent multiple small chronic pancreatic pseudocysts. A repeat imaging evaluation of the abdomen 6 months would be useful to assess for the stability these findings, and could be performed with CT (particularly if the patient is unable to hold her breath), or if the patient is better able to breath hold, repeat MRI of the abdomen with IV gadolinium. 2. Mild nodular contour of the liver suggestive of early changes of cirrhosis. 3. Bilateral perinephric fluid is nonspecific, but commonly seen in the setting of renal insufficiency. 4. Cardiomegaly.   Electronically Signed   By: Trudie Reed M.D.   On: 04/18/2013 14:55   Dg Chest Port 1 View  05/09/2013   CLINICAL DATA:  Heart failure. Shortness of breath. Pulmonary edema.  EXAM: PORTABLE CHEST - 1 VIEW  COMPARISON:  05/07/2013  FINDINGS: Marked cardiopericardial enlargement. Unchanged tortuosity of the aorta. The lungs are hyperinflated chronically. There is pulmonary venous congestion without edema or effusion. No pneumothorax. No asymmetric opacity.  IMPRESSION: Pulmonary venous congestion without edema or effusion.   Electronically Signed   By: Tiburcio Pea M.D.   On: 05/09/2013 05:54   Dg Chest Port 1 View  04/16/2013   CLINICAL DATA:  Respiratory failure  EXAM: PORTABLE CHEST - 1 VIEW  COMPARISON:  04/13/2013  FINDINGS: Increased interstitial edema with associated significant cardiomegaly is suggestive of congestive heart failure. No pleural fluid is identified.  IMPRESSION: Worsening interstitial edema.    Electronically Signed   By: Irish Lack M.D.   On: 04/16/2013 15:12    Microbiology: No results found for this or any previous visit (from the past 240 hour(s)).   Labs: Basic Metabolic Panel:  Recent Labs Lab 05/07/13 1525 05/08/13 0350 05/09/13 0418 05/10/13 0505  NA 124* 126* 131* 129*  K 4.9 4.1 4.1 4.4  CL 87* 87* 88* 88*  CO2 24 26 26 24   GLUCOSE 263* 349* 143* 186*  BUN 16 24* 38* 39*  CREATININE 1.59* 1.85* 2.30* 1.96*  CALCIUM 8.6 8.9 8.7 8.7   Liver Function Tests:  Recent Labs Lab 05/07/13 1525  AST 20  ALT 16  ALKPHOS 58  BILITOT 0.5  PROT 6.4  ALBUMIN 3.3*   No results found for this basename: LIPASE, AMYLASE,  in the last 168 hours No results found for this basename: AMMONIA,  in the last 168 hours CBC:  Recent Labs Lab 05/07/13 1525 05/09/13 0418  WBC 3.4* 4.8  NEUTROABS 1.7  --   HGB 8.2* 8.5*  HCT 24.4* 25.8*  MCV 90.4 90.5  PLT 204 248   Cardiac Enzymes:  Recent Labs Lab 05/07/13 1525 05/07/13 2116 05/08/13 0330  TROPONINI <0.30 <0.30 <0.30   BNP: BNP (last 3 results)  Recent Labs  03/07/13 1715 04/13/13 1645 05/07/13 1525  PROBNP 1156.0* 13238.0* 6589.0*   CBG:  Recent Labs Lab 05/09/13 0730 05/09/13 1215 05/09/13 1718 05/09/13 2135 05/10/13 0722  GLUCAP 108* 157* 231* 265* 161*    Time coordinating discharge: 45 minutes  Signed:  Lizabeth Fellner  [facility] 05/10/2013, 2:55 PM

## 2013-05-10 NOTE — Progress Notes (Signed)
Came to visit Mrs Ribaudo at bedside to explain and offer [facility] services. Agreeable to [facility] and consents were signed. Explained that these services will not replace of interfere with services provided by home health. Reports she is active with [place]. She will receive a post hospital discharge call and will be evaluated for monthly home visits. Inpatient RNCM aware of bedside visit. Left [facility] Care Management packet and contact information at bedside. Appreciative of visit. [name], MSN, RN,BSN- [facility] [phone]

## 2013-05-10 NOTE — Progress Notes (Addendum)
Physical Therapy Treatment Patient Details Name: [name] [name] MRN: [id number] DOB: 1932/05/04 Today's Date: 05/10/2013 Time: 4920-1007 PT Time Calculation (min): 25 min  PT Assessment / Plan / Recommendation  History of Present Illness 80 y.o. female with  h/o Wgener's granulomatosis, atrial fibrillation, DM, HTN, breast cancer in remission, metastatic intestinal carcinoid, vertigo admitted wtih SOB.    PT Comments   Pt feeling better.  C/O chest pain from "alot of coughing".  Amb in hallway with RW on RA avg 93% with 2/4 DOE.  Pt required increased time and rest breaks.  Pt also demon initial MAX anxiety about the possibility of falling as she stated she has a hx of Vertigo.  Pt tolerated session well and plans to D/C to home tomorrow.   Follow Up Recommendations  Home health PT     Does the patient have the potential to tolerate intense rehabilitation     Barriers to Discharge        Equipment Recommendations  None recommended by PT    Recommendations for Other Services    Frequency Min 3X/week   Progress towards PT Goals Progress towards PT goals: Progressing toward goals  Plan      Precautions / Restrictions Precautions Precautions: Fall Precaution Comments: hx vertigo, monitor O2, alot anxiety/fear of falling Restrictions Weight Bearing Restrictions: No   Pertinent Vitals/Pain C/o chest pain with coughing    Mobility  Bed Mobility General bed mobility comments: Pt OOB in recliner Transfers Overall transfer level: Needs assistance Equipment used: Rolling walker (2 wheeled) Transfers: Sit to/from Stand Sit to Stand: Min guard;Supervision General transfer comment: MinGuard/Supervision with noted initial anxiety/fear of falling Hx Vertogo Ambulation/Gait Ambulation/Gait assistance: Min guard Ambulation Distance (Feet): 185 Feet Assistive device: Rolling walker (2 wheeled) Gait Pattern/deviations: Shuffle;Decreased stride length Gait velocity: decr General Gait  Details: amb on RA sats avg 93%.  Initial MAX anxiety/fera of falling with increased tremors throughout and urgency to sit.  Required MAX reinforcement to ensure stability.  Spouse assisted by following with chair.  Noted Mod chest congestion and cough.      PT Goals (current goals can now be found in the care plan section)    Visit Information  Last PT Received On: 05/10/13 Assistance Needed: +1 History of Present Illness: 80 y.o. female with  h/o Wgener's granulomatosis, atrial fibrillation, DM, HTN, breast cancer in remission, metastatic intestinal carcinoid, vertigo admitted wtih SOB.     Subjective Data      Cognition       Balance     End of Session PT - End of Session Equipment Utilized During Treatment: Gait belt Activity Tolerance: Patient limited by fatigue Patient left: in bed;with call bell/phone within reach;with family/visitor present   Rica Koyanagi  PTA [facility]  Acute  Rehab Pager      [phone]

## 2013-05-11 ENCOUNTER — Telehealth: Payer: Self-pay | Admitting: Family Medicine

## 2013-05-11 LAB — GLUCOSE, CAPILLARY: Glucose-Capillary: 144 mg/dL — ABNORMAL HIGH (ref 70–99)

## 2013-05-11 NOTE — Telephone Encounter (Signed)
Relevant patient education mailed to patient.

## 2013-05-12 ENCOUNTER — Telehealth: Payer: Self-pay | Admitting: Family Medicine

## 2013-05-12 NOTE — Telephone Encounter (Signed)
Left message for [name] [name] to return call

## 2013-05-12 NOTE — Telephone Encounter (Signed)
Pt was discharge form hospital on 05-10-13. Tammy rn from [facility]  is trying to figure out pt insulin dosage and other medications.

## 2013-05-13 MED ORDER — LEVOFLOXACIN 500 MG PO TABS: 500.0000 mg | ORAL_TABLET | Freq: Every day | ORAL | Status: DC

## 2013-05-13 NOTE — Telephone Encounter (Signed)
Pt called to make post hosp fup/ pt states they forgot to give her the antibiotic from the hospital. Tammy nurse states she was going to call you to see if dr burchette will fill that rx. Also pt unsure about glipiZIDE (GLUCOTROL) 10 MG tablet / hospital took this away from pt and pt doesn't know if she should take. pls advise.

## 2013-05-13 NOTE — Telephone Encounter (Signed)
Pt informed and RX sent to pharmacy

## 2013-05-13 NOTE — Telephone Encounter (Signed)
Discharge summary is in chart. Pt did not receive her Levaquin. Is it okay to send a new RX to pharmacy.

## 2013-05-13 NOTE — Telephone Encounter (Signed)
Pt aware rx at pharm.

## 2013-05-13 NOTE — Telephone Encounter (Signed)
Hold Glucotrol issues instructed to do so. Refill Levaquin

## 2013-05-17 ENCOUNTER — Telehealth: Payer: Self-pay | Admitting: Family Medicine

## 2013-05-17 ENCOUNTER — Telehealth: Payer: Self-pay | Admitting: *Deleted

## 2013-05-17 NOTE — Telephone Encounter (Signed)
FYI:  Dr Elease Hashimoto

## 2013-05-17 NOTE — Telephone Encounter (Signed)
Called pt and blood sugar is now 300.  I will call back in about an hour and have her check it again. I told her at supper tonight, do as she u sually does and then take insulin according to sliding scale.

## 2013-05-17 NOTE — Telephone Encounter (Signed)
adriana physical therapist from [facility] is calling to notified md they will be extending PT orders. Pt discharge from hospital.

## 2013-05-17 NOTE — Telephone Encounter (Signed)
Talked with pat now at 4:45pm and blood sugar is now 169.  Pt will now go back to regular insulin dosage/sliding scale insulin.  Pt states she is aware of the sx of hypoglycemia and will watch for it.

## 2013-05-17 NOTE — Telephone Encounter (Signed)
Pt takes novolog sliding scale tid with 350+ blood sugar she is to take 18units.  Pt mistakenly took glipizide at lunch, and called drug store for advise as to what to do , and pharmacist told her not to take insulin now.  Blood sugar was 417. Per dr Raliegh Ip take 18 units of novolog now and call us back in 2 hours with blood sugar.  Throw away old glipizide. Adrian,physical therpaist is there with pt and will help her and call us back with blood sugar reading,

## 2013-05-19 ENCOUNTER — Encounter: Payer: Self-pay | Admitting: Family Medicine

## 2013-05-19 ENCOUNTER — Ambulatory Visit (INDEPENDENT_AMBULATORY_CARE_PROVIDER_SITE_OTHER): Payer: Medicare Other | Admitting: Family Medicine

## 2013-05-19 VITALS — BP 124/64 | HR 52 | Wt 191.0 lb

## 2013-05-19 DIAGNOSIS — I10 Essential (primary) hypertension: Secondary | ICD-10-CM

## 2013-05-19 DIAGNOSIS — J441 Chronic obstructive pulmonary disease with (acute) exacerbation: Secondary | ICD-10-CM | POA: Diagnosis not present

## 2013-05-19 DIAGNOSIS — E871 Hypo-osmolality and hyponatremia: Secondary | ICD-10-CM

## 2013-05-19 DIAGNOSIS — E119 Type 2 diabetes mellitus without complications: Secondary | ICD-10-CM

## 2013-05-19 DIAGNOSIS — D649 Anemia, unspecified: Secondary | ICD-10-CM

## 2013-05-19 NOTE — Progress Notes (Signed)
Subjective:    Patient ID: [name] [name], female    DOB: Feb 28, 1933, 80 y.o.   MRN: [id number]  HPI Patient here for hospital followup.  She was readmitted on 05/07/2013 discharged on 05/10/2013. She presented with acute hypoxic respiratory failure felt to be secondary to acute COPD exacerbation probably secondary to viral URI. Her chest x-ray did not show any evidence for pneumonia. She has background history of very complicated pulmonary situation with Wegener's granulomatosis and history of acute on chronic diastolic heart failure, pulmonary hypertension, right-sided heart failure.  Patient had initial chest x-ray which showed vascular congestion. Repeat echo ejection fraction 60-65%. She was given IV Lasix and diuresed 5 L and developed acute kidney injury. She was given steroids for COPD exacerbations currently on 20 mg daily and tapering back starting tomorrow 10 mg. She was given empiric antibiotics but chest x-ray did not suggest any pneumonia.  She is currently back on her usual blood pressure medications and blood pressure stable. She has some chronic hyponatremia including with recent hospitalization. Diabetes been stable. Last A1c 7.2%. Blood sugars been very stable since discharge. Fasting blood sugar is more than 113. She remains on Lantus 10 units once daily and sliding scale NovoLog 3 times daily with meals.  Patient is normocytic anemia with mixed picture. B12 normal. Iron 47 with saturation 14%. Patient placed on daily iron. She is likely anemia of chronic disease.  Overall. She feels respiratory status is stable at this time.  Past Medical History  Diagnosis Date  . HYPOTHYROIDISM 07/24/2008  . HYPERLIPIDEMIA 07/24/2008  . DEPRESSION 05/16/2009  . HYPERTENSION 07/24/2008  . Vertigo   . Diabetes mellitus type II   . Arthritis     r tkr  . CVA (cerebral vascular accident) 2008    mini stroke.no residual  . Heart murmur     stress test 2009.dr peter [place]  . Intermittent  vertigo   . Skin abnormality     facial lesions .pt applying mupiracin to areas  . Atrial fibrillation   . Breast cancer   . Pneumonia   . 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 Knee surgery  2009    TKR  . Cholecystectomy  1980  . Joint replacement      r knee  . Breast surgery  2007    Lumpectomy, XRT 2006.l breast  . Video assisted thoracoscopy  04/22/2011    Procedure: VIDEO ASSISTED THORACOSCOPY;  Surgeon: Melrose Nakayama, MD;  Location: [facility];  Service: Thoracic;  Laterality: Left;  WITH BIOPSY  . Heel spur surgery Right   . Exploratory laparotomy      reports that she quit smoking about 30 years ago. Her smoking use included Cigarettes. She has a 6 pack-year smoking history. She has never used smokeless tobacco. She reports that she does not drink alcohol or use illicit drugs. family history includes Arthritis in her mother; Breast cancer in her sister; Cancer in her sister; Coronary artery disease in her brother and father; Heart disease in her father; Lung cancer in her brother; Rheum arthritis in her mother. Allergies  Allergen Reactions  . Codeine Sulfate Other (See Comments)    REACTION: GI upset  . Sulfonamide Derivatives Other (See Comments)    REACTION: GI upset  . Atarax [Hydroxyzine] Nausea Only and Rash      Review of Systems  Constitutional: Positive for fatigue. Negative for fever and chills.  HENT: Negative for congestion  and postnasal drip.   Respiratory: Positive for shortness of breath (Chronic and unchanged). Negative for cough and wheezing.   Endocrine: Negative for polydipsia and polyuria.  Genitourinary: Negative for dysuria.  Neurological: Negative for dizziness.  Hematological: Negative for adenopathy.       Objective:   Physical Exam  Constitutional: She appears well-developed and well-nourished.  HENT:  Right Ear: External ear normal.  Left Ear: External ear normal.    Mouth/Throat: Oropharynx is clear and moist.  Neck: Neck supple.  Cardiovascular: Normal rate.   Pulmonary/Chest: Effort normal.  Musculoskeletal: She exhibits no edema.  Lymphadenopathy:    She has no cervical adenopathy.          Assessment & Plan:  #1 recent COPD exacerbation. Probable viral URI. She is back to baseline this point. She is tapering back prednisone. She has not had any recurrent fever #2 type 2 diabetes. Well controlled by home readings. Continue current regimen #3 normocytic anemia. Continue iron replacement. Consider repeat CBC and followup in one month #4 chronic hyponatremia. Recheck basic metabolic panel

## 2013-05-19 NOTE — Progress Notes (Signed)
Pre visit review using our clinic review tool, if applicable. No additional management support is needed unless otherwise documented below in the visit note.

## 2013-05-19 NOTE — Patient Instructions (Signed)
Follow up for any fever or increased shortness of breath.

## 2013-05-20 ENCOUNTER — Telehealth: Payer: Self-pay | Admitting: Family Medicine

## 2013-05-20 NOTE — Telephone Encounter (Signed)
Relevant patient education mailed to patient.

## 2013-05-21 NOTE — Assessment & Plan Note (Signed)
Now down to redness on 10 mg daily

## 2013-05-21 NOTE — Assessment & Plan Note (Signed)
Hospital may have been triggered by a viral acute bronchitis/pneumonia on top of her underlying granulomatous disease/Wegener's.

## 2013-05-21 NOTE — Assessment & Plan Note (Signed)
Attributed to vascular disease as well as her diastolic dysfunction

## 2013-05-21 NOTE — Assessment & Plan Note (Signed)
Probably cardiogenic edema associated with increased neck veins. Plan-continue Lasix.

## 2013-05-23 ENCOUNTER — Other Ambulatory Visit: Payer: Self-pay | Admitting: Cardiology

## 2013-05-23 ENCOUNTER — Other Ambulatory Visit: Payer: Self-pay | Admitting: Family Medicine

## 2013-05-23 NOTE — Telephone Encounter (Signed)
Last visit 05/19/13 Last refill 01/05/2013 #30 0 refill

## 2013-05-23 NOTE — Telephone Encounter (Signed)
Refill once 

## 2013-05-24 ENCOUNTER — Ambulatory Visit: Payer: Medicare Other | Admitting: Internal Medicine

## 2013-05-26 ENCOUNTER — Telehealth: Payer: Self-pay | Admitting: Family Medicine

## 2013-05-26 NOTE — Telephone Encounter (Signed)
If any increased leg edema, increase furosemide 20 mg to two daily for 3 days only, then resume one daily.

## 2013-05-26 NOTE — Telephone Encounter (Signed)
Relevant patient education mailed to patient.

## 2013-05-26 NOTE — Telephone Encounter (Signed)
Nurse is visiting pt, notice pt has increase in weight 190 on 05/23/13 and now 05/26/13 weight 193 lbs. No other signs or symptoms.  She is currently taking furosemide (LASIX) 20 MG tablet.

## 2013-05-27 ENCOUNTER — Telehealth: Payer: Self-pay | Admitting: Family Medicine

## 2013-05-27 ENCOUNTER — Ambulatory Visit (INDEPENDENT_AMBULATORY_CARE_PROVIDER_SITE_OTHER): Payer: Medicare Other | Admitting: Internal Medicine

## 2013-05-27 ENCOUNTER — Encounter: Payer: Self-pay | Admitting: Internal Medicine

## 2013-05-27 VITALS — BP 130/78 | HR 54 | Ht 65.5 in | Wt 199.0 lb

## 2013-05-27 DIAGNOSIS — M3130 Wegener's granulomatosis without renal involvement: Secondary | ICD-10-CM | POA: Diagnosis not present

## 2013-05-27 DIAGNOSIS — J9600 Acute respiratory failure, unspecified whether with hypoxia or hypercapnia: Secondary | ICD-10-CM | POA: Diagnosis not present

## 2013-05-27 DIAGNOSIS — J441 Chronic obstructive pulmonary disease with (acute) exacerbation: Secondary | ICD-10-CM

## 2013-05-27 DIAGNOSIS — I272 Pulmonary hypertension, unspecified: Secondary | ICD-10-CM

## 2013-05-27 DIAGNOSIS — I2789 Other specified pulmonary heart diseases: Secondary | ICD-10-CM | POA: Diagnosis not present

## 2013-05-27 NOTE — Patient Instructions (Signed)
Let Dr Elease Hashimoto know if your sugar drops  We will be considering whether you should be on a medicine for your pulmonary hypertension  Try reducing the Dulera to 1 puff, twice daily. See if you stay clear with this dose.

## 2013-05-27 NOTE — Telephone Encounter (Signed)
Pt instructed by hospital nurse to cb if she gained 3-3 lbs overnite. Pt states she gained 2 lbs last nite and would like to know what she should do now.

## 2013-05-27 NOTE — Telephone Encounter (Signed)
[name] [name] stated the patient legs have no edema but she she gained 7lbs in 8 days. [name] thinks it maybe in her abdomen.

## 2013-05-27 NOTE — Telephone Encounter (Signed)
[name] [name] informed

## 2013-05-27 NOTE — Telephone Encounter (Signed)
Left message for [name] [name] to return call

## 2013-05-27 NOTE — Progress Notes (Signed)
04/03/11- 80 year old female former smoking history referred courtesy of Dr. Elease Hashimoto with concern of abnormal chest CT. Husband is an allergy patient here. Dr. Erick Blinks recent office note reviewed "Followup from recent pneumonia. Patient had presented here with extreme myalgias and chronic pain along with bilateral shoulder hip pain. Very high sedimentation rate of 120.  She almost total resolution of myalgias with low dose prednisone strongly suggesting polymyalgia rheumatica. She subsequently presented with cough with trace of blood but no dyspnea, fever, reported a pain. Chest x-ray showed bilateral infiltrates suggesting probable bilateral pneumonia the recommendation for CT scan. CT scan revealed no adenopathy and no evidence for a lung mass. Compatible with bilateral pneumonia. Patient placed on antibiotic and much better at this time with no fever and essentially no cough. Her muscle and joint pains are almost 100% resolved with low-dose prednisone. She did present back in September with severe sinus disease suggesting acute sinusitis. CT revealed no acute findings. She denies any arthralgias at this time. No skin rash. No fever." She has been on prednisone 10 mg twice daily since December 13 and feels significantly better. Morning cough has cleared with residual trace phlegm and trace blood. No chest pain. CT chest 03/23/2011 -images reviewed by me-bilateral patchy airspace disease with air bronchograms. No cavitation or definite nodularity seen. She has had right total knee replacement but no generalized arthritis. Childhood exposure to an uncle with tuberculosis. Her PPD skin test was negative. She denies history of lung disease, GERD, DVT, kidney or liver disease. Lab- C-ANCA POS 1:320, P-ANCA NEG. RA 1:23, ANA NEG11 year old female former smoking history referred courtesy of Dr. Elease Hashimoto with concern of abnormal chest CT. Husband is an allergy patient here.  05/13/11- 52 yoF former smoker  followed for bilateral pulmonary infiltrates, incr C-ANCA, s/p VATS bx/ organizing pneumonia, Metastatic intestinal carcinoid. Marland Kitchen Hx breast Ca 2006/ Dr Truddie Coco Husband here. She returns now after left lung biopsy. She has expected left thoracotomy pain but is otherwise stable as she continues maintenance prednisone. Denies blood, fever, swollen glands, discolored sputum. Exertional dyspnea is not worse, allowing for the surgery. I shared with her and her husband the available initial pathology: Patchy organizing pneumonia with chronic interstitial pneumonia and focal atypical glands. I reviewed the discussion about her at thoracic oncology conference. The parenchymal lung process is not a vasculitis as I originally questioned. I told her there was an incidental finding of cancer cells with further identification pending but that this was a separate process. We agreed to refer her to Dr. Truddie Coco who managed her breast cancer. After she had left, personal communication from Dr. Jimmy Footman- neuroendocrine tumor staining consistent with metastatic intestinal carcinoid.  06/20/11  11 yoF former smoker followed for bilateral pulmonary infiltrates, incr C-ANCA, s/p VATS bx/ organizing pneumonia, Metastatic intestinal carcinoid. Marland Kitchen Hx breast Ca 2006/ Dr Truddie Coco   Husband here She says her breathing is now "fine" back to her normal. She denies fever, cough, sputum. I spoke with Mrs. Roehrs myself and had to explain the discovery of carcinoid after her lung biopsy. We also have the note from the [facility] indicating they discussed it with her. She has absolutely no recollection. Little cough or shortness of breath now. Still a little posterior thoracotomy incision pain. CT 05/19/11- images reviewed with them- IMPRESSION:  1. Marked improvement in bilateral air space disease seen on  comparison CT of 03/24/2011. Near complete resolution.  2. Postoperative change in the left hemithorax consistent with  recent VATS.   Original Report  Authenticated By: Suzy Bouchard, M.D.   11/07/11- 57 yoF former smoker followed for bilateral pulmonary infiltrates, incr C-ANCA, s/p VATS bx/ organizing pneumonia, Metastatic intestinal carcinoid. Marland Kitchen Hx breast Ca 2006/ Dr Truddie Coco    c/o  Chronic vertigo, difficulty breathing and gets tired easy. Also some wheezing, post nasal,  and cough.  Some days she feels much better than others, no real pattern. Occasional light cough to clear throat. We had called in prednisone, which did help. Ambulatory around home, but gets out little. CT chest 09/09/11- I reviewed w/ her- there is significant patchy bilateral lower zone fibrotic scarring and some bronchial thickening. IMPRESSION:  1. No evidence of thoracic metastatic disease or other active  process.  2. Small mild cardiomegaly and hiatal hernia.  Original Report Authenticated By: Marlaine Hind, M.D.    02/09/12-  27 yoF former smoker followed for bilateral pulmonary infiltrates, incr C-ANCA, s/p VATS bx/ cryptogenic organizing pneumonia/ BOOP, Metastatic intestinal carcinoid. Marland Kitchen Hx breast Ca 2006/ Dr Truddie Coco  January had biopsy to ck for other lung condition. SOB and wheezing at times. Left sided pain around the  Back. Continues Advair 250. Due for followup chest CT in December for Dr. Jacquiline Doe. Not on prednisone. She says breathing is unchanged. Denies pain, blood or swollen nodes.   05/11/12-  89 yoF former smoker followed for bilateral pulmonary infiltrates, incr C-ANCA, s/p VATS bx/ cryptogenic organizing pneumonia/ BOOP, Metastatic intestinal carcinoid. Complicated by Hx breast Ca 2006/ Dr Truddie Coco. AFib/ Dr Martinique, DM. She says her husband is developing dementia. Deep breath makes her left flank feel tight where she had thoracentesis. Complains of itching since starting Eliquis.Atarax 50 mg was too strong and Benadryl did not help. Itching keeps her awake all night. No visible rash. She is convinced really unpleasant itching began when  she started Eliquis. I explained this medication needs to be transitioned carefully to anything else and Dr. Martinique would need to be the one to manage that. CT chest 03/26/12 . IMPRESSION:  1. No findings to strongly suggest metastatic disease to the  thorax.  2. There is a new focus of nodular ground-glass attenuation  measuring 1 cm in the anterior aspect of the left upper lobe which  is highly nonspecific. Initial follow-up by chest CT without  contrast is recommended in 3 months to confirm persistence. This  recommendation follows the consensus statement: Recommendations for  the Management of Subsolid Pulmonary Nodules Detected at CT: A  Statement from the Fleischner Society as published in Radiology  2013; 266:304-317.  3. Atherosclerosis, including left main and three-vessel coronary  artery disease. Assessment for potential risk factor  modification, dietary therapy or pharmacologic therapy may be  warranted, if clinically indicated.  4. Slight increase in the small amount of pericardial fluid  compared to the prior examination. This is unlikely to be of  hemodynamic significance at this time.  5. Moderate cardiomegaly.  6. Additional incidental findings, similar to prior studies, as  discussed above.  Original Report Authenticated By: Vinnie Langton, M.D.   08/08/12- 15 yoF former smoker followed for bilateral pulmonary infiltrates/ nodules, incr C-ANCA, s/p VATS bx/ cryptogenic organizing pneumonia/ BOOP, Metastatic intestinal carcinoid. Complicated by Hx breast Ca 2006/ Dr Truddie Coco. AFib/ Dr Martinique, DM. Itching stopped, while continuing Eliquis. She had changed detergents and fabric softener, but cause unknown. Seasonal rhinitis symptoms now. Total IgE 05/11/2012 was less than 1.5. New burning itch, intermittent, right lateral ribs x2 weeks. Has had shingles shot. No visible rash. CT chest  08/02/12 IMPRESSION:  Interval persistence but decrease in size of the ground-glass   nodules seen in the anterior left upper lobe. Adenocarcinoma  cannot be excluded. Followup by CT is recommended in 12 months,  with continued annual surveillance for a minimum of 3 years.  These recommendations are taken from "Recommendations for the  Management of Subsolid Pulmonary Nodules Detected at CT: A  Statement from the [place]" Radiology 2013; 266:1, 304-  317.  Original Report Authenticated By: Misty Stanley, M.D.   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 Relapse since 2013 11/02/12 PR-3 880. MPO negative. - GBM neg Likely - capillaritis. 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 Admitted with hemoptysis  hemoptysis with CT scan chest findings of alveolar hemorrhage  As of June 2014 the oncologist did not feel there was any evidence of breast cancer recurrence.CT scan of the chest ruled out pulmonary embolism but showed severe bilateral groundglass opacities consistent with alveolar hemorrhage pattern. Pulmonary consultation was called. She was initially treated by stopping eliquis, providing empiric antibiotics and supported with diuresis.  Pulmonary  felt The clinical picture was felt to be consistent with diffuse alveolar hemorrhage resulting in hemoptysis. Most likely cause of alveolar hemorrhage was felt to be autoimmune based on past history of autoimmune antibody positivity and lung biopsy showing organizing pneumonia in December 2012/January 2013. Prior C.-ANCA positive though no evidence of vasculitis in Jan 2013 lung bx which is puzzling. Alternative is relapsing BOOP but the hemoptysis would be unsual unless precipitated by Eliquis. On 11/02/12 PR-3 880. Nephrology consult  On  7/29 immunosuppression therapy was initiated. She was given Pulse methylprednisolone, for three days, and her first dose of Rituxan was given on 7/30. The following days we saw significant clinical improvement in regards to hemoptysis, pulmonary infiltrates and dyspnea, She was started on  Rituxan over cyclophosphamide due to malignancy history, and also this is technically a "relapse" or A "flare". Dose starting 11/03/12 will be Induction therapy with rituximab (375 mg/m2 per week for four weeks). W/  PCP prophylaxis w/ - Bactrim 1 DS on M, W, F  Since discharge she is feeling better with resolution of hemoptysis, . Dyspnea is less and she is starting to get some of her energy back.  No fever , orthopnea or edema.  Had labs done last week. , hbg stable at 8.9 , renal fx tr down at 1.6 (max scr 2.4) . Ov with oncology last week. Rituxan  infusion given.   12/03/12- 85 yoF former smoker followed for bilateral pulmonary infiltrates/ nodules, incr C-ANCA, s/p VATS bx/ Granulomatous Polyangiitis (Wegener's) vs cryptogenic organizing pneumonia/ BOOP, Metastatic intestinal carcinoid. Complicated by Hx breast Ca 2006/ Dr Truddie Coco. AFib/ Dr Martinique, DM. Started Rituxan 375 mg/m2x 4 weeks 11/03/12. PCP prophy Bactrim 1DS on M,W,F. FOLLOWS FOR: reports doing well since ov w/ TP.  still doing well on Dulera, but needs a refill.  still taking prednisone 9m daily. 1 dose of Rituxan still pending. Had to have transfusion last week for hemoglobin 7.7. No hemoptysis since a little bit at first recognition of her granulomatous disease. She has continued prednisone 60 mg daily since hospital discharge, and we have discussed tapering the dose. No coughing, no chest pain or fever.  01/17/13- 50 yoF former smoker followed for bilateral pulmonary infiltrates/ nodules, incr C-ANCA, s/p VATS bx/ Granulomatous Polyangiitis (Wegener's) vs cryptogenic organizing pneumonia/ BOOP, Metastatic intestinal carcinoid. Complicated by Hx breast  Ca 2006/ Dr Truddie Coco. AFib/ Dr Martinique, DM. FOLLOWS FOR:  Breathing has improved since last OV needs refill on dulera Had flu vaccine Paces herself. Feels she is getting stronger. Has not needed Dulera in 2 or 3 weeks.  ROS-see HPI Constitutional:   No-   weight loss, night sweats, fevers, chills, fatigue, lassitude. HEENT:   No-  headaches, difficulty swallowing, tooth/dental problems, sore throat,       No-  sneezing, itching, ear ache, nasal congestion, post nasal drip,  CV: +chest pain, orthopnea, PND, swelling in lower extremities, anasarca,   + chronic dizziness,   No-palpitations Resp: +   shortness of breath with exertion or at rest.           No- productive cough,  No non-productive cough,               No-   change in color of mucus.  No- wheezing.   Skin: No-   rash or lesions. no- pruritus GI:  No-   heartburn, indigestion, abdominal pain, nausea, vomiting,  GU: MS:  No-   joint pain or swelling.  Neuro-     +Vertigo- uses wheelchair Psych:  No- change in mood or affect. No depression or anxiety.  No memory loss.  OBJ General- Alert, Oriented, Affect-appropriate, Distress- none acute, overweight, wheelchair Skin- rash-none, lesions- none, excoriation- none Lymphadenopathy- none Head- atraumatic            Eyes- Gross vision intact, PERRLA, conjunctivae -pale            Ears- Hearing, canals-normal            Nose- turbinate edema, no-Septal dev, mucus, polyps, erosion, perforation             Throat- Mallampati II-III , mucosa clear , drainage- none, tonsils- atrophic Neck- flexible , trachea midline, no stridor , thyroid nl, carotid no bruit Chest - symmetrical excursion , unlabored           Heart/CV-+ irregularly irregular , 2-3/6 precordial systolic murmur , no gallop  , no rub, nl s1 s2                           - JVD- none , edema+1, stasis changes- none, varices- none           Lung- clear, no wheeze , cough- none , dullness-none, rub- none           Chest wall-  healed VATS thoracotomy incision L Abd-  Br/ Gen/ Rectal- Not done, not indicated Extrem- cyanosis- none, clubbing, none, atrophy- none, strength- nl. Wheelchair for distance/ cane at home.  Neuro- grossly intact to observation     04/03/11- 80 year old female former smoking history referred courtesy of Dr. Elease Hashimoto with concern of abnormal chest CT. Husband is an allergy patient here. Dr. Erick Blinks recent office note reviewed "Followup from recent pneumonia. Patient had presented here with extreme myalgias and chronic pain along with bilateral shoulder hip pain. Very high sedimentation rate of 120.  She almost total resolution of myalgias with low dose prednisone strongly suggesting polymyalgia rheumatica. She subsequently presented with cough with trace of blood but no dyspnea, fever, reported a pain. Chest x-ray showed bilateral infiltrates suggesting probable bilateral pneumonia the recommendation for CT  scan. CT scan revealed no adenopathy and no evidence for a lung mass. Compatible with bilateral pneumonia. Patient placed on antibiotic and much better at this time with no fever and essentially no cough. Her muscle and joint pains are almost 100% resolved with low-dose prednisone. She did present back in September with severe sinus disease suggesting acute sinusitis. CT revealed no acute findings. She denies any arthralgias at this time. No skin rash. No fever." She has been on prednisone 10 mg twice daily since December 13 and feels significantly better. Morning cough has cleared with residual trace phlegm and trace blood. No chest pain. CT chest 03/23/2011 -images reviewed by me-bilateral patchy airspace disease with air bronchograms. No cavitation or definite nodularity seen. She has had right total knee replacement but no generalized arthritis. Childhood exposure to an uncle with tuberculosis. Her PPD skin test was negative. She denies history of lung disease, GERD, DVT, kidney or liver  disease. Lab- C-ANCA POS 1:320, P-ANCA NEG. RA 1:41, ANA NEG40 year old female former smoking history referred courtesy of Dr. Elease Hashimoto with concern of abnormal chest CT. Husband is an allergy patient here.  05/13/11- 19 yoF former smoker followed for bilateral pulmonary infiltrates, incr C-ANCA, s/p VATS bx/ organizing pneumonia, Metastatic intestinal carcinoid. Marland Kitchen Hx breast Ca 2006/ Dr Truddie Coco Husband here. She returns now after left lung biopsy. She has expected left thoracotomy pain but is otherwise stable as she continues maintenance prednisone. Denies blood, fever, swollen glands, discolored sputum. Exertional dyspnea is not worse, allowing for the surgery. I shared with her and her husband the available initial pathology: Patchy organizing pneumonia with chronic interstitial pneumonia and focal atypical glands. I reviewed the discussion about her at thoracic oncology conference. The parenchymal lung process is not a vasculitis as I originally questioned. I told her there was an incidental finding of cancer cells with further identification pending but that this was a separate process. We agreed to refer her to Dr. Truddie Coco who managed her breast cancer. After she had left, personal communication from Dr. Jimmy Footman- neuroendocrine tumor staining consistent with metastatic intestinal carcinoid.  06/20/11  46 yoF former smoker followed for bilateral pulmonary infiltrates, incr C-ANCA, s/p VATS bx/ organizing pneumonia, Metastatic intestinal carcinoid. Marland Kitchen Hx breast Ca 2006/ Dr Truddie Coco   Husband here She says her breathing is now "fine" back to her normal. She denies fever, cough, sputum. I spoke with Mrs. Lennartz myself and had to explain the discovery of carcinoid after her lung biopsy. We also have the note from the [facility] indicating they discussed it with her. She has absolutely no recollection. Little cough or shortness of breath now. Still a little posterior thoracotomy incision pain. CT 05/19/11-  images reviewed with them- IMPRESSION:  1. Marked improvement in bilateral air space disease seen on  comparison CT of 03/24/2011. Near complete resolution.  2. Postoperative change in the left hemithorax consistent with  recent VATS.  Original Report Authenticated By: Suzy Bouchard, M.D.   11/07/11- 66 yoF former smoker followed for bilateral pulmonary infiltrates, incr C-ANCA, s/p VATS bx/ organizing pneumonia, Metastatic intestinal carcinoid. Marland Kitchen Hx breast Ca 2006/ Dr Truddie Coco    c/o  Chronic vertigo, difficulty breathing and gets tired easy. Also some wheezing, post nasal,  and cough.  Some days she feels much better than others, no real pattern. Occasional light cough to clear throat. We had called in prednisone, which did help. Ambulatory around home, but gets out little. CT chest 09/09/11- I reviewed w/ her- there is significant patchy  bilateral lower zone fibrotic scarring and some bronchial thickening. IMPRESSION:  1. No evidence of thoracic metastatic disease or other active  process.  2. Small mild cardiomegaly and hiatal hernia.  Original Report Authenticated By: Marlaine Hind, M.D.    02/09/12-  79 yoF former smoker followed for bilateral pulmonary infiltrates, incr C-ANCA, s/p VATS bx/ cryptogenic organizing pneumonia/ BOOP, Metastatic intestinal carcinoid. Marland Kitchen Hx breast Ca 2006/ Dr Truddie Coco  January had biopsy to ck for other lung condition. SOB and wheezing at times. Left sided pain around the  Back. Continues Advair 250. Due for followup chest CT in December for Dr. Jacquiline Doe. Not on prednisone. She says breathing is unchanged. Denies pain, blood or swollen nodes.   05/11/12-  64 yoF former smoker followed for bilateral pulmonary infiltrates, incr C-ANCA, s/p VATS bx/ cryptogenic organizing pneumonia/ BOOP, Metastatic intestinal carcinoid. Complicated by Hx breast Ca 2006/ Dr Truddie Coco. AFib/ Dr Martinique, DM. She says her husband is developing dementia. Deep breath makes her left flank feel  tight where she had thoracentesis. Complains of itching since starting Eliquis.Atarax 50 mg was too strong and Benadryl did not help. Itching keeps her awake all night. No visible rash. She is convinced really unpleasant itching began when she started Eliquis. I explained this medication needs to be transitioned carefully to anything else and Dr. Martinique would need to be the one to manage that. CT chest 03/26/12 . IMPRESSION:  1. No findings to strongly suggest metastatic disease to the  thorax.  2. There is a new focus of nodular ground-glass attenuation  measuring 1 cm in the anterior aspect of the left upper lobe which  is highly nonspecific. Initial follow-up by chest CT without  contrast is recommended in 3 months to confirm persistence. This  recommendation follows the consensus statement: Recommendations for  the Management of Subsolid Pulmonary Nodules Detected at CT: A  Statement from the Fleischner Society as published in Radiology  2013; 266:304-317.  3. Atherosclerosis, including left main and three-vessel coronary  artery disease. Assessment for potential risk factor  modification, dietary therapy or pharmacologic therapy may be  warranted, if clinically indicated.  4. Slight increase in the small amount of pericardial fluid  compared to the prior examination. This is unlikely to be of  hemodynamic significance at this time.  5. Moderate cardiomegaly.  6. Additional incidental findings, similar to prior studies, as  discussed above.  Original Report Authenticated By: Vinnie Langton, M.D.   08/08/12- 75 yoF former smoker followed for bilateral pulmonary infiltrates/ nodules, incr C-ANCA, s/p VATS bx/ cryptogenic organizing pneumonia/ BOOP, Metastatic intestinal carcinoid. Complicated by Hx breast Ca 2006/ Dr Truddie Coco. AFib/ Dr Martinique, DM. Itching stopped, while continuing Eliquis. She had changed detergents and fabric softener, but cause unknown. Seasonal rhinitis symptoms now.  Total IgE 05/11/2012 was less than 1.5. New burning itch, intermittent, right lateral ribs x2 weeks. Has had shingles shot. No visible rash. CT chest 08/02/12 IMPRESSION:  Interval persistence but decrease in size of the ground-glass  nodules seen in the anterior left upper lobe. Adenocarcinoma  cannot be excluded. Followup by CT is recommended in 12 months,  with continued annual surveillance for a minimum of 3 years.  These recommendations are taken from "Recommendations for the  Management of Subsolid Pulmonary Nodules Detected at CT: A  Statement from the [place]" Radiology 2013; 266:1, 304-  317.  Original Report Authenticated By: Misty Stanley, M.D.   11/19/2012 [facility] follow up  Patient presents for a post hospital  followup Admitted 11/01/2012 - 11/08/2012 for  Granulomatous Polyangitis (former Wegener). with Alveolar Hemorrhage. Relapse since 2013 11/02/12 PR-3 880. MPO negative. - GBM neg Likely - capillaritis. 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 Admitted with hemoptysis  hemoptysis with CT scan chest findings of alveolar hemorrhage  As of June 2014 the oncologist did not feel there was any evidence of breast cancer recurrence.CT scan of the chest ruled out pulmonary embolism but showed severe bilateral groundglass opacities consistent with alveolar hemorrhage pattern. Pulmonary consultation was called. She was initially treated by stopping eliquis, providing empiric antibiotics and supported with diuresis.  Pulmonary  felt The clinical picture was felt to be consistent with diffuse alveolar hemorrhage resulting in hemoptysis. Most likely cause of alveolar hemorrhage was felt to be autoimmune based on past history of autoimmune antibody positivity and lung biopsy showing organizing pneumonia in December 2012/January 2013.  Prior C.-ANCA positive though no evidence of vasculitis in Jan 2013 lung bx which is puzzling. Alternative is relapsing BOOP but the hemoptysis would be unsual unless precipitated by Eliquis. On 11/02/12 PR-3 880. Nephrology consult  On 7/29 immunosuppression therapy was initiated. She was given Pulse methylprednisolone, for three days, and her first dose of Rituxan was given on 7/30. The following days we saw significant clinical improvement in regards to hemoptysis, pulmonary infiltrates and dyspnea, She was started on  Rituxan over cyclophosphamide due to malignancy history, and also this is technically a "relapse" or A "flare". Dose starting 11/03/12 will be Induction therapy with rituximab (375 mg/m2 per week for four weeks). W/  PCP prophylaxis w/ - Bactrim 1 DS on M, W, F  Since discharge she is feeling better with resolution of hemoptysis, . Dyspnea is less and she is starting to get some of her energy back.  No fever , orthopnea or edema.  Had labs done last week. , hbg stable at 8.9 , renal fx tr down at 1.6 (max scr 2.4) . Ov with oncology last week. Rituxan  infusion given.   12/03/12- 89 yoF former smoker followed for bilateral pulmonary infiltrates/ nodules, incr C-ANCA, s/p VATS bx/ Granulomatous Polyangiitis (Wegener's) vs cryptogenic organizing pneumonia/ BOOP, Metastatic intestinal carcinoid. Complicated by Hx breast Ca 2006/ Dr Truddie Coco. AFib/ Dr Martinique, DM. Started Rituxan 375 mg/m2x 4 weeks 11/03/12. PCP prophy Bactrim 1DS on M,W,F. FOLLOWS FOR: reports doing well since ov w/ TP.  still doing well on Dulera, but needs a refill.  still taking prednisone 25m daily. 1 dose of Rituxan still pending. Had to have transfusion last week for hemoglobin 7.7. No hemoptysis since a little bit at first recognition of her granulomatous disease. She has continued prednisone 60 mg daily since hospital discharge, and we have discussed tapering the dose. No coughing, no chest pain or fever.  01/17/13- 89 yoF former smoker followed for bilateral pulmonary infiltrates/ nodules, incr C-ANCA, s/p VATS bx/ Granulomatous Polyangiitis (Wegener's) vs cryptogenic organizing pneumonia/ BOOP, Metastatic intestinal carcinoid. Complicated by Hx breast Ca 2006/ Dr RTruddie Coco AFib/ Dr JMartinique DM. FOLLOWS FOR:  Breathing has improved since last OV needs refill on dulera Had flu vaccine Paces herself. Feels she is getting stronger. Has not needed Dulera in 2 or 3 weeks.  04/22/14- 75yoF former smoker followed for bilateral pulmonary infiltrates/ nodules, incr C-ANCA, s/p VATS bx/ Granulomatous Polyangiitis (Wegener's) vs cryptogenic organizing pneumonia/ BOOP, Metastatic intestinal  carcinoid. Complicated by Hx breast Ca 2006/ Dr Truddie Coco. AFib/ Dr Martinique, DM. FOLLOWS FOR: recently in hospital; bronchitis Hospital 1/7-1/12/15- acute bronchitis/respiratory failure/probably viral. There may have been fluid overload, she feels better now on daily Lasix. Prednisone now at 1/2x20 mg daily. CT chest 04/14/13 IMPRESSION:  1. Stable cardiac enlargement and coronary artery calcifications.  2. Patchy ground-glass opacity in both lungs with vague nodularity.  Findings suggest a partial airspace filling process such as edema,  hemorrhage or inflammatory or infectious alveolitis. No focal  airspace consolidation.  3. Possible new cystic lesion in the pancreatic body/tail junction  region. Please see above discussion.  Electronically Signed  By: Kalman Jewels M.D.  On: 04/14/2013 10:56  05/27/13- 70 yoF former smoker followed for bilateral pulmonary infiltrates/ nodules, incr C-ANCA, s/p VATS bx/ Granulomatous Polyangiitis (Wegener's) vs cryptogenic organizing pneumonia/ BOOP, Metastatic intestinal carcinoid. Complicated by Hx breast Ca 2006/ Dr Truddie Coco. AFib/ Dr Martinique, DM. FOLLOWS FOR:  Breathing doing well-- no concerns today Minimal dry cough. Not needing Dulera or O2. Neb BID helps. CXR 05/09/13 1V FINDINGS:  Marked  cardiopericardial enlargement. Unchanged tortuosity of the  aorta. The lungs are hyperinflated chronically. There is pulmonary  venous congestion without edema or effusion. No pneumothorax. No  asymmetric opacity.  IMPRESSION:  Pulmonary venous congestion without edema or effusion.  Electronically Signed  By: Jorje Guild M.D.  On: 05/09/2013 05:54 ECHO 04/14/13 Study Conclusions - Left ventricle: The cavity size was normal. Wall thickness was increased in a pattern of mild LVH. Systolic function was normal. The estimated ejection fraction was in the range of 60% to 65%. - Mitral valve: Mild to moderate regurgitation. - Left atrium: The atrium was moderately to severely dilated. - Right ventricle: The cavity size was mildly to moderately dilated. Wall thickness was normal. Systolic function was mildly reduced. - Right atrium: The atrium was severely dilated. - Tricuspid valve: Moderate regurgitation. - Pulmonary arteries: PA peak pressure: 71m Hg (S). Transthoracic echocardiography. M-mode, complete 2D, spectral Doppler, and color Doppler. Height: Height: 166.4cm. Height: 65.5in. Weight: Weight: 94.3kg. Weight: 207.5lb. Body mass index: BMI: 34.1kg/m^2. Body surface area: BSA: 2.162m. Blood pressure: 145/72. Patient status: Inpatient. Location: Bedside.  ROS-see HPI Constitutional:   No-   weight loss, night sweats, fevers, chills, fatigue, lassitude. HEENT:   No-  headaches, difficulty swallowing, tooth/dental problems, sore throat,       No-  sneezing, itching, ear ache, nasal congestion, post nasal drip,  CV: No -chest pain, orthopnea, PND, swelling in lower extremities, anasarca,   + chronic dizziness,   No-palpitations Resp: +   shortness of breath with exertion or at rest.           No- productive cough,  No non-productive cough,               No-   change in color of mucus.  No- wheezing.   Skin: No-   rash or lesions. no- pruritus GI:  No-   heartburn, indigestion,  abdominal pain, nausea, vomiting,  GU: MS:  No-   joint pain or swelling.  Neuro-     +Vertigo- uses wheelchair Psych:  No- change in mood or affect. No depression or anxiety.  No memory loss.  OBJ General- Alert, Oriented, Affect-appropriate, Distress- none acute, overweight, wheelchair Skin- rash-none, lesions- none, excoriation- none Lymphadenopathy- none Head- atraumatic            Eyes- Gross vision intact, PERRLA, conjunctivae -pale  Ears- Hearing, canals-normal            Nose- turbinate edema, no-Septal dev, mucus, polyps, erosion, perforation             Throat- Mallampati II-III , mucosa clear , drainage- none, tonsils- atrophic Neck- flexible , trachea midline, no stridor , thyroid nl, carotid no bruit Chest - symmetrical excursion , unlabored           Heart/CV-+ irregularly irregular , 2-3/6 precordial systolic murmur , no gallop  , no rub, nl               s1 s2                           - JVD+full , edema+1, stasis changes- none, varices- none           Lung- clear, no wheeze , cough- none , dullness-none, rub- none           Chest wall- healed VATS thoracotomy incision L Abd-  Br/ Gen/ Rectal- Not done, not indicated Extrem- cyanosis- none, clubbing, none, atrophy- none, strength- nl. Wheelchair for distance/ cane at home.  Neuro- grossly intact to observation

## 2013-05-27 NOTE — Telephone Encounter (Signed)
Go ahead with increased Lasix as discussed.

## 2013-05-27 NOTE — Telephone Encounter (Signed)
Pt informed per Dr. Elease Hashimoto. Take two tablets for 3 days then resume to 1 tablet daily

## 2013-05-31 ENCOUNTER — Other Ambulatory Visit: Payer: Self-pay | Admitting: Family Medicine

## 2013-06-01 ENCOUNTER — Telehealth: Payer: Self-pay | Admitting: Family Medicine

## 2013-06-01 MED ORDER — ALBUTEROL SULFATE (2.5 MG/3ML) 0.083% IN NEBU: INHALATION_SOLUTION | RESPIRATORY_TRACT | Status: DC

## 2013-06-01 NOTE — Telephone Encounter (Signed)
Pt stated that she is feeling better.

## 2013-06-01 NOTE — Telephone Encounter (Signed)
Pt informed

## 2013-06-01 NOTE — Telephone Encounter (Signed)
Continue with nebulizer as needed for cough or wheezing

## 2013-06-01 NOTE — Telephone Encounter (Signed)
Pt would like to know should she continue albuterol nebulizer. Please advise. [facility] [address]

## 2013-06-15 ENCOUNTER — Telehealth: Payer: Self-pay | Admitting: Family Medicine

## 2013-06-15 MED ORDER — INSULIN PEN NEEDLE 31G X 6 MM MISC: Status: DC

## 2013-06-15 NOTE — Telephone Encounter (Signed)
RX sent to pharmacy 

## 2013-06-15 NOTE — Telephone Encounter (Signed)
[facility], [place] - [address] requesting new script for RELION PEN NEEDLES [id number]

## 2013-06-16 ENCOUNTER — Encounter: Payer: Self-pay | Admitting: Family Medicine

## 2013-06-16 ENCOUNTER — Other Ambulatory Visit: Payer: Self-pay

## 2013-06-16 ENCOUNTER — Ambulatory Visit (INDEPENDENT_AMBULATORY_CARE_PROVIDER_SITE_OTHER): Payer: Medicare Other | Admitting: Family Medicine

## 2013-06-16 VITALS — BP 128/78 | HR 79 | Temp 98.5°F | Wt 199.0 lb

## 2013-06-16 DIAGNOSIS — E119 Type 2 diabetes mellitus without complications: Secondary | ICD-10-CM | POA: Diagnosis not present

## 2013-06-16 DIAGNOSIS — D649 Anemia, unspecified: Secondary | ICD-10-CM | POA: Diagnosis not present

## 2013-06-16 DIAGNOSIS — I10 Essential (primary) hypertension: Secondary | ICD-10-CM | POA: Diagnosis not present

## 2013-06-16 DIAGNOSIS — E871 Hypo-osmolality and hyponatremia: Secondary | ICD-10-CM

## 2013-06-16 LAB — BASIC METABOLIC PANEL
BUN: 32 mg/dL — AB (ref 6–23)
CHLORIDE: 93 meq/L — AB (ref 96–112)
CO2: 28 mEq/L (ref 19–32)
Calcium: 9 mg/dL (ref 8.4–10.5)
Creatinine, Ser: 1.6 mg/dL — ABNORMAL HIGH (ref 0.4–1.2)
GFR: 32.14 mL/min — ABNORMAL LOW (ref >60.00)
Glucose, Bld: 94 mg/dL (ref 70–99)
POTASSIUM: 4.4 meq/L (ref 3.5–5.1)
Sodium: 129 mEq/L — ABNORMAL LOW (ref 135–145)

## 2013-06-16 LAB — CBC WITH DIFFERENTIAL/PLATELET
BASOS PCT: 0.3 % (ref 0.0–3.0)
Basophils Absolute: 0 10*3/uL (ref 0.0–0.1)
EOS PCT: 0.5 % (ref 0.0–5.0)
Eosinophils Absolute: 0.1 10*3/uL (ref 0.0–0.7)
HEMATOCRIT: 27.8 % — AB (ref 36.0–46.0)
Hemoglobin: 9.3 g/dL — ABNORMAL LOW (ref 12.0–15.0)
LYMPHS ABS: 1.7 10*3/uL (ref 0.7–4.0)
Lymphocytes Relative: 14.1 % (ref 12.0–46.0)
MCHC: 33.5 g/dL (ref 30.0–36.0)
MCV: 92.7 fl (ref 78.0–100.0)
Monocytes Absolute: 1.2 10*3/uL — ABNORMAL HIGH (ref 0.1–1.0)
Monocytes Relative: 9.5 % (ref 3.0–12.0)
NEUTROS ABS: 9.3 10*3/uL — AB (ref 1.4–7.7)
Neutrophils Relative %: 75.6 % (ref 43.0–77.0)
PLATELETS: 315 10*3/uL (ref 150.0–400.0)
RBC: 3 Mil/uL — ABNORMAL LOW (ref 3.87–5.11)
RDW: 16.9 % — ABNORMAL HIGH (ref 11.5–14.6)
WBC: 12.3 10*3/uL — AB (ref 4.5–10.5)

## 2013-06-16 LAB — HEMOGLOBIN A1C: HEMOGLOBIN A1C: 7 % — AB (ref 4.6–6.5)

## 2013-06-16 MED ORDER — ALBUTEROL SULFATE (2.5 MG/3ML) 0.083% IN NEBU: INHALATION_SOLUTION | RESPIRATORY_TRACT | Status: DC

## 2013-06-16 MED ORDER — INSULIN PEN NEEDLE 31G X 6 MM MISC: Status: DC

## 2013-06-16 NOTE — Progress Notes (Signed)
Pre visit review using our clinic review tool, if applicable. No additional management support is needed unless otherwise documented below in the visit note.

## 2013-06-16 NOTE — Progress Notes (Signed)
Subjective:    Patient ID: [name] [name], female    DOB: 07/18/1932, 80 y.o.   MRN: [id number]  HPI Followup diabetes. She's been on prednisone is tapered but currently at 10 mg. She feels fairly well overall. Her blood sugars fasting have been frequently in the 90 range. No hypoglycemic symptoms. Last A1c 7.2%. No symptoms of hyperglycemia. She currently takes Lantus 10 units once daily and NovoLog sliding scale. No recent hypoglycemia.  She has history of normocytic anemia with probable anemia of chronic disease. Denies any dizziness. No orthostasis.   She has history of chronic hyponatremia. She has chronic kidney disease stage IV. No recent peripheral edema issues.  Past Medical History  Diagnosis Date  . HYPOTHYROIDISM 07/24/2008  . HYPERLIPIDEMIA 07/24/2008  . DEPRESSION 05/16/2009  . HYPERTENSION 07/24/2008  . Vertigo   . Diabetes mellitus type II   . Arthritis     r tkr  . CVA (cerebral vascular accident) 2008    mini stroke.no residual  . Heart murmur     stress test 2009.dr peter Martinique  . Intermittent vertigo   . Skin abnormality     facial lesions .pt applying mupiracin to areas  . Atrial fibrillation   . Breast cancer   . Pneumonia   . 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 Knee surgery  2009    TKR  . Cholecystectomy  1980  . Joint replacement      r knee  . Breast surgery  2007    Lumpectomy, XRT 2006.l breast  . Video assisted thoracoscopy  04/22/2011    Procedure: VIDEO ASSISTED THORACOSCOPY;  Surgeon: Melrose Nakayama, MD;  Location: [facility];  Service: Thoracic;  Laterality: Left;  WITH BIOPSY  . Heel spur surgery Right   . Exploratory laparotomy      reports that she quit smoking about 31 years ago. Her smoking use included Cigarettes. She has a 6 pack-year smoking history. She has never used smokeless tobacco. She reports that she does not drink alcohol or use illicit drugs. family history includes  Arthritis in her mother; Breast cancer in her sister; Cancer in her sister; Coronary artery disease in her brother and father; Heart disease in her father; Lung cancer in her brother; Rheum arthritis in her mother. Allergies  Allergen Reactions  . Codeine Sulfate Other (See Comments)    REACTION: GI upset  . Sulfonamide Derivatives Other (See Comments)    REACTION: GI upset  . Atarax [Hydroxyzine] Nausea Only and Rash      Review of Systems  Constitutional: Positive for fatigue.  Eyes: Negative for visual disturbance.  Respiratory: Negative for cough, chest tightness, shortness of breath and wheezing.   Cardiovascular: Negative for chest pain, palpitations and leg swelling.  Endocrine: Negative for polydipsia and polyuria.  Neurological: Negative for dizziness, seizures, syncope, weakness, light-headedness and headaches.       Objective:   Physical Exam  Constitutional: She appears well-developed and well-nourished.  Neck: Neck supple. No thyromegaly present.  Cardiovascular: Normal rate.   Pulmonary/Chest: Effort normal and breath sounds normal. No respiratory distress. She has no wheezes. She has no rales.  Musculoskeletal: She exhibits no edema.  Neurological: She is alert.          Assessment & Plan:  #1 type 2 diabetes. History of good control. Recheck A1c. We may be able taper her insulin back as her prednisone doses are tapered #2 chronic kidney disease.  Check basic metabolic panel #3 chronic normocytic anemia. Suspect anemia of chronic disease. Repeat CBC

## 2013-06-17 ENCOUNTER — Telehealth: Payer: Self-pay | Admitting: Family Medicine

## 2013-06-17 NOTE — Telephone Encounter (Signed)
Relevant patient education mailed to patient.

## 2013-06-19 DIAGNOSIS — H8110 Benign paroxysmal vertigo, unspecified ear: Secondary | ICD-10-CM | POA: Diagnosis not present

## 2013-06-19 DIAGNOSIS — J8409 Other alveolar and parieto-alveolar conditions: Secondary | ICD-10-CM | POA: Diagnosis not present

## 2013-06-19 DIAGNOSIS — I5033 Acute on chronic diastolic (congestive) heart failure: Secondary | ICD-10-CM | POA: Diagnosis not present

## 2013-06-19 DIAGNOSIS — M3130 Wegener's granulomatosis without renal involvement: Secondary | ICD-10-CM | POA: Diagnosis not present

## 2013-06-19 DIAGNOSIS — I4891 Unspecified atrial fibrillation: Secondary | ICD-10-CM | POA: Diagnosis not present

## 2013-06-19 DIAGNOSIS — E119 Type 2 diabetes mellitus without complications: Secondary | ICD-10-CM | POA: Diagnosis not present

## 2013-06-20 ENCOUNTER — Ambulatory Visit
Admission: RE | Admit: 2013-06-20 | Discharge: 2013-06-20 | Disposition: A | Discharge status: Home / Self Care | Payer: Medicare Other | Source: Ambulatory Visit | Attending: Family | Admitting: Family

## 2013-06-20 DIAGNOSIS — Z853 Personal history of malignant neoplasm of breast: Secondary | ICD-10-CM

## 2013-06-20 DIAGNOSIS — R922 Inconclusive mammogram: Secondary | ICD-10-CM | POA: Diagnosis not present

## 2013-06-21 NOTE — Assessment & Plan Note (Signed)
Watching activity.

## 2013-06-21 NOTE — Assessment & Plan Note (Signed)
Better control. Consider low dose Dulera, 1 puff twice daily, if compromise needed.

## 2013-06-21 NOTE — Assessment & Plan Note (Signed)
Consider Levitra, Tracleer, Sildenafil. Discussed.

## 2013-06-21 NOTE — Assessment & Plan Note (Signed)
Pulmonary vascular congestion may become a limiting issue.

## 2013-07-13 ENCOUNTER — Telehealth: Payer: Self-pay | Admitting: Family Medicine

## 2013-07-13 ENCOUNTER — Ambulatory Visit: Payer: Self-pay | Admitting: Family Medicine

## 2013-07-13 NOTE — Telephone Encounter (Signed)
Noted pt coming into office 07/13/13

## 2013-07-13 NOTE — Telephone Encounter (Signed)
Patient Information:  Caller Name: Demari  Phone: [phone]  Patient: [name] [name], [name] [name]  Gender: Female  DOB: 1932-10-10  Age: 80 Years  PCP: Carolann Littler (Family Practice)  Office Follow Up:  Does the office need to follow up with this patient?: No  Instructions For The Office: N/A   Symptoms  Reason For Call & Symptoms: Itching to eyes. Reports she feels this may be allergies because she sat outside a day or two ago. Reports itching to nose as well.  Reviewed Health History In EMR: Yes  Reviewed Medications In EMR: Yes  Reviewed Allergies In EMR: Yes  Reviewed Surgeries / Procedures: Yes  Date of Onset of Symptoms: 07/12/2013  Guideline(s) Used:  Itching - Widespread  Disposition Per Guideline:   See Today in Office  Reason For Disposition Reached:   Severe widespread itching (i.e., interferes with sleep, normal activities or school) and not improved after 24 hours of itching Care Advice  Advice Given:  N/A  Patient Will Follow Care Advice:  YES  Appointment Scheduled:  07/13/2013 16:30:00 Appointment Scheduled Provider:  Carolann Littler (Family Practice)

## 2013-07-19 ENCOUNTER — Encounter: Payer: Self-pay | Admitting: Internal Medicine

## 2013-07-19 ENCOUNTER — Ambulatory Visit (INDEPENDENT_AMBULATORY_CARE_PROVIDER_SITE_OTHER)
Admission: RE | Admit: 2013-07-19 | Discharge: 2013-07-19 | Disposition: A | Discharge status: Home / Self Care | Payer: Medicare Other | Source: Ambulatory Visit | Attending: Internal Medicine | Admitting: Internal Medicine

## 2013-07-19 ENCOUNTER — Ambulatory Visit: Payer: Medicare Other | Admitting: Internal Medicine

## 2013-07-19 VITALS — BP 118/84 | HR 58 | Ht 66.0 in | Wt 200.0 lb

## 2013-07-19 DIAGNOSIS — M3130 Wegener's granulomatosis without renal involvement: Secondary | ICD-10-CM | POA: Diagnosis not present

## 2013-07-19 DIAGNOSIS — R0989 Other specified symptoms and signs involving the circulatory and respiratory systems: Secondary | ICD-10-CM | POA: Diagnosis not present

## 2013-07-19 DIAGNOSIS — J441 Chronic obstructive pulmonary disease with (acute) exacerbation: Secondary | ICD-10-CM

## 2013-07-19 NOTE — Progress Notes (Signed)
04/03/11- 80 year old female former smoking history referred courtesy of Dr. Elease Hashimoto with concern of abnormal chest CT. Husband is an allergy patient here. Dr. Erick Blinks recent office note reviewed "Followup from recent pneumonia. Patient had presented here with extreme myalgias and chronic pain along with bilateral shoulder hip pain. Very high sedimentation rate of 120.  She almost total resolution of myalgias with low dose prednisone strongly suggesting polymyalgia rheumatica. She subsequently presented with cough with trace of blood but no dyspnea, fever, reported a pain. Chest x-ray showed bilateral infiltrates suggesting probable bilateral pneumonia the recommendation for CT scan. CT scan revealed no adenopathy and no evidence for a lung mass. Compatible with bilateral pneumonia. Patient placed on antibiotic and much better at this time with no fever and essentially no cough. Her muscle and joint pains are almost 100% resolved with low-dose prednisone. She did present back in September with severe sinus disease suggesting acute sinusitis. CT revealed no acute findings. She denies any arthralgias at this time. No skin rash. No fever." She has been on prednisone 10 mg twice daily since December 13 and feels significantly better. Morning cough has cleared with residual trace phlegm and trace blood. No chest pain. CT chest 03/23/2011 -images reviewed by me-bilateral patchy airspace disease with air bronchograms. No cavitation or definite nodularity seen. She has had right total knee replacement but no generalized arthritis. Childhood exposure to an uncle with tuberculosis. Her PPD skin test was negative. She denies history of lung disease, GERD, DVT, kidney or liver disease. Lab- C-ANCA POS 1:320, P-ANCA NEG. RA 1:23, ANA NEG11 year old female former smoking history referred courtesy of Dr. Elease Hashimoto with concern of abnormal chest CT. Husband is an allergy patient here.  05/13/11- 52 yoF former smoker  followed for bilateral pulmonary infiltrates, incr C-ANCA, s/p VATS bx/ organizing pneumonia, Metastatic intestinal carcinoid. Marland Kitchen Hx breast Ca 2006/ Dr Truddie Coco Husband here. She returns now after left lung biopsy. She has expected left thoracotomy pain but is otherwise stable as she continues maintenance prednisone. Denies blood, fever, swollen glands, discolored sputum. Exertional dyspnea is not worse, allowing for the surgery. I shared with her and her husband the available initial pathology: Patchy organizing pneumonia with chronic interstitial pneumonia and focal atypical glands. I reviewed the discussion about her at thoracic oncology conference. The parenchymal lung process is not a vasculitis as I originally questioned. I told her there was an incidental finding of cancer cells with further identification pending but that this was a separate process. We agreed to refer her to Dr. Truddie Coco who managed her breast cancer. After she had left, personal communication from Dr. Jimmy Footman- neuroendocrine tumor staining consistent with metastatic intestinal carcinoid.  06/20/11  11 yoF former smoker followed for bilateral pulmonary infiltrates, incr C-ANCA, s/p VATS bx/ organizing pneumonia, Metastatic intestinal carcinoid. Marland Kitchen Hx breast Ca 2006/ Dr Truddie Coco   Husband here She says her breathing is now "fine" back to her normal. She denies fever, cough, sputum. I spoke with Mrs. Roehrs myself and had to explain the discovery of carcinoid after her lung biopsy. We also have the note from the [facility] indicating they discussed it with her. She has absolutely no recollection. Little cough or shortness of breath now. Still a little posterior thoracotomy incision pain. CT 05/19/11- images reviewed with them- IMPRESSION:  1. Marked improvement in bilateral air space disease seen on  comparison CT of 03/24/2011. Near complete resolution.  2. Postoperative change in the left hemithorax consistent with  recent VATS.   Original Report  Authenticated By: Suzy Bouchard, M.D.   11/07/11- 57 yoF former smoker followed for bilateral pulmonary infiltrates, incr C-ANCA, s/p VATS bx/ organizing pneumonia, Metastatic intestinal carcinoid. Marland Kitchen Hx breast Ca 2006/ Dr Truddie Coco    c/o  Chronic vertigo, difficulty breathing and gets tired easy. Also some wheezing, post nasal,  and cough.  Some days she feels much better than others, no real pattern. Occasional light cough to clear throat. We had called in prednisone, which did help. Ambulatory around home, but gets out little. CT chest 09/09/11- I reviewed w/ her- there is significant patchy bilateral lower zone fibrotic scarring and some bronchial thickening. IMPRESSION:  1. No evidence of thoracic metastatic disease or other active  process.  2. Small mild cardiomegaly and hiatal hernia.  Original Report Authenticated By: Marlaine Hind, M.D.    02/09/12-  27 yoF former smoker followed for bilateral pulmonary infiltrates, incr C-ANCA, s/p VATS bx/ cryptogenic organizing pneumonia/ BOOP, Metastatic intestinal carcinoid. Marland Kitchen Hx breast Ca 2006/ Dr Truddie Coco  January had biopsy to ck for other lung condition. SOB and wheezing at times. Left sided pain around the  Back. Continues Advair 250. Due for followup chest CT in December for Dr. Jacquiline Doe. Not on prednisone. She says breathing is unchanged. Denies pain, blood or swollen nodes.   05/11/12-  89 yoF former smoker followed for bilateral pulmonary infiltrates, incr C-ANCA, s/p VATS bx/ cryptogenic organizing pneumonia/ BOOP, Metastatic intestinal carcinoid. Complicated by Hx breast Ca 2006/ Dr Truddie Coco. AFib/ Dr Martinique, DM. She says her husband is developing dementia. Deep breath makes her left flank feel tight where she had thoracentesis. Complains of itching since starting Eliquis.Atarax 50 mg was too strong and Benadryl did not help. Itching keeps her awake all night. No visible rash. She is convinced really unpleasant itching began when  she started Eliquis. I explained this medication needs to be transitioned carefully to anything else and Dr. Martinique would need to be the one to manage that. CT chest 03/26/12 . IMPRESSION:  1. No findings to strongly suggest metastatic disease to the  thorax.  2. There is a new focus of nodular ground-glass attenuation  measuring 1 cm in the anterior aspect of the left upper lobe which  is highly nonspecific. Initial follow-up by chest CT without  contrast is recommended in 3 months to confirm persistence. This  recommendation follows the consensus statement: Recommendations for  the Management of Subsolid Pulmonary Nodules Detected at CT: A  Statement from the Fleischner Society as published in Radiology  2013; 266:304-317.  3. Atherosclerosis, including left main and three-vessel coronary  artery disease. Assessment for potential risk factor  modification, dietary therapy or pharmacologic therapy may be  warranted, if clinically indicated.  4. Slight increase in the small amount of pericardial fluid  compared to the prior examination. This is unlikely to be of  hemodynamic significance at this time.  5. Moderate cardiomegaly.  6. Additional incidental findings, similar to prior studies, as  discussed above.  Original Report Authenticated By: Vinnie Langton, M.D.   08/08/12- 15 yoF former smoker followed for bilateral pulmonary infiltrates/ nodules, incr C-ANCA, s/p VATS bx/ cryptogenic organizing pneumonia/ BOOP, Metastatic intestinal carcinoid. Complicated by Hx breast Ca 2006/ Dr Truddie Coco. AFib/ Dr Martinique, DM. Itching stopped, while continuing Eliquis. She had changed detergents and fabric softener, but cause unknown. Seasonal rhinitis symptoms now. Total IgE 05/11/2012 was less than 1.5. New burning itch, intermittent, right lateral ribs x2 weeks. Has had shingles shot. No visible rash. CT chest  08/02/12 IMPRESSION:  Interval persistence but decrease in size of the ground-glass   nodules seen in the anterior left upper lobe. Adenocarcinoma  cannot be excluded. Followup by CT is recommended in 12 months,  with continued annual surveillance for a minimum of 3 years.  These recommendations are taken from "Recommendations for the  Management of Subsolid Pulmonary Nodules Detected at CT: A  Statement from the [place]" Radiology 2013; 266:1, 304-  317.  Original Report Authenticated By: Misty Stanley, M.D.   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 Relapse since 2013 11/02/12 PR-3 880. MPO negative. - GBM neg Likely - capillaritis. 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 Admitted with hemoptysis  hemoptysis with CT scan chest findings of alveolar hemorrhage  As of June 2014 the oncologist did not feel there was any evidence of breast cancer recurrence.CT scan of the chest ruled out pulmonary embolism but showed severe bilateral groundglass opacities consistent with alveolar hemorrhage pattern. Pulmonary consultation was called. She was initially treated by stopping eliquis, providing empiric antibiotics and supported with diuresis.  Pulmonary  felt The clinical picture was felt to be consistent with diffuse alveolar hemorrhage resulting in hemoptysis. Most likely cause of alveolar hemorrhage was felt to be autoimmune based on past history of autoimmune antibody positivity and lung biopsy showing organizing pneumonia in December 2012/January 2013. Prior C.-ANCA positive though no evidence of vasculitis in Jan 2013 lung bx which is puzzling. Alternative is relapsing BOOP but the hemoptysis would be unsual unless precipitated by Eliquis. On 11/02/12 PR-3 880. Nephrology consult  On  7/29 immunosuppression therapy was initiated. She was given Pulse methylprednisolone, for three days, and her first dose of Rituxan was given on 7/30. The following days we saw significant clinical improvement in regards to hemoptysis, pulmonary infiltrates and dyspnea, She was started on  Rituxan over cyclophosphamide due to malignancy history, and also this is technically a "relapse" or A "flare". Dose starting 11/03/12 will be Induction therapy with rituximab (375 mg/m2 per week for four weeks). W/  PCP prophylaxis w/ - Bactrim 1 DS on M, W, F  Since discharge she is feeling better with resolution of hemoptysis, . Dyspnea is less and she is starting to get some of her energy back.  No fever , orthopnea or edema.  Had labs done last week. , hbg stable at 8.9 , renal fx tr down at 1.6 (max scr 2.4) . Ov with oncology last week. Rituxan  infusion given.   12/03/12- 85 yoF former smoker followed for bilateral pulmonary infiltrates/ nodules, incr C-ANCA, s/p VATS bx/ Granulomatous Polyangiitis (Wegener's) vs cryptogenic organizing pneumonia/ BOOP, Metastatic intestinal carcinoid. Complicated by Hx breast Ca 2006/ Dr Truddie Coco. AFib/ Dr Martinique, DM. Started Rituxan 375 mg/m2x 4 weeks 11/03/12. PCP prophy Bactrim 1DS on M,W,F. FOLLOWS FOR: reports doing well since ov w/ TP.  still doing well on Dulera, but needs a refill.  still taking prednisone 9m daily. 1 dose of Rituxan still pending. Had to have transfusion last week for hemoglobin 7.7. No hemoptysis since a little bit at first recognition of her granulomatous disease. She has continued prednisone 60 mg daily since hospital discharge, and we have discussed tapering the dose. No coughing, no chest pain or fever.  01/17/13- 50 yoF former smoker followed for bilateral pulmonary infiltrates/ nodules, incr C-ANCA, s/p VATS bx/ Granulomatous Polyangiitis (Wegener's) vs cryptogenic organizing pneumonia/ BOOP, Metastatic intestinal carcinoid. Complicated by Hx breast  Ca 2006/ Dr Truddie Coco. AFib/ Dr Martinique, DM. FOLLOWS FOR:  Breathing has improved since last OV needs refill on dulera Had flu vaccine Paces herself. Feels she is getting stronger. Has not needed Dulera in 2 or 3 weeks.  ROS-see HPI Constitutional:   No-   weight loss, night sweats, fevers, chills, fatigue, lassitude. HEENT:   No-  headaches, difficulty swallowing, tooth/dental problems, sore throat,       No-  sneezing, itching, ear ache, nasal congestion, post nasal drip,  CV: +chest pain, orthopnea, PND, swelling in lower extremities, anasarca,   + chronic dizziness,   No-palpitations Resp: +   shortness of breath with exertion or at rest.           No- productive cough,  No non-productive cough,               No-   change in color of mucus.  No- wheezing.   Skin: No-   rash or lesions. no- pruritus GI:  No-   heartburn, indigestion, abdominal pain, nausea, vomiting,  GU: MS:  No-   joint pain or swelling.  Neuro-     +Vertigo- uses wheelchair Psych:  No- change in mood or affect. No depression or anxiety.  No memory loss.  OBJ General- Alert, Oriented, Affect-appropriate, Distress- none acute, overweight, wheelchair Skin- rash-none, lesions- none, excoriation- none Lymphadenopathy- none Head- atraumatic            Eyes- Gross vision intact, PERRLA, conjunctivae -pale            Ears- Hearing, canals-normal            Nose- turbinate edema, no-Septal dev, mucus, polyps, erosion, perforation             Throat- Mallampati II-III , mucosa clear , drainage- none, tonsils- atrophic Neck- flexible , trachea midline, no stridor , thyroid nl, carotid no bruit Chest - symmetrical excursion , unlabored           Heart/CV-+ irregularly irregular , 2-3/6 precordial systolic murmur , no gallop  , no rub, nl s1 s2                           - JVD- none , edema+1, stasis changes- none, varices- none           Lung- clear, no wheeze , cough- none , dullness-none, rub- none           Chest wall-  healed VATS thoracotomy incision L Abd-  Br/ Gen/ Rectal- Not done, not indicated Extrem- cyanosis- none, clubbing, none, atrophy- none, strength- nl. Wheelchair for distance/ cane at home.  Neuro- grossly intact to observation     04/03/11- 80 year old female former smoking history referred courtesy of Dr. Elease Hashimoto with concern of abnormal chest CT. Husband is an allergy patient here. Dr. Erick Blinks recent office note reviewed "Followup from recent pneumonia. Patient had presented here with extreme myalgias and chronic pain along with bilateral shoulder hip pain. Very high sedimentation rate of 120.  She almost total resolution of myalgias with low dose prednisone strongly suggesting polymyalgia rheumatica. She subsequently presented with cough with trace of blood but no dyspnea, fever, reported a pain. Chest x-ray showed bilateral infiltrates suggesting probable bilateral pneumonia the recommendation for CT  scan. CT scan revealed no adenopathy and no evidence for a lung mass. Compatible with bilateral pneumonia. Patient placed on antibiotic and much better at this time with no fever and essentially no cough. Her muscle and joint pains are almost 100% resolved with low-dose prednisone. She did present back in September with severe sinus disease suggesting acute sinusitis. CT revealed no acute findings. She denies any arthralgias at this time. No skin rash. No fever." She has been on prednisone 10 mg twice daily since December 13 and feels significantly better. Morning cough has cleared with residual trace phlegm and trace blood. No chest pain. CT chest 03/23/2011 -images reviewed by me-bilateral patchy airspace disease with air bronchograms. No cavitation or definite nodularity seen. She has had right total knee replacement but no generalized arthritis. Childhood exposure to an uncle with tuberculosis. Her PPD skin test was negative. She denies history of lung disease, GERD, DVT, kidney or liver  disease. Lab- C-ANCA POS 1:320, P-ANCA NEG. RA 1:41, ANA NEG40 year old female former smoking history referred courtesy of Dr. Elease Hashimoto with concern of abnormal chest CT. Husband is an allergy patient here.  05/13/11- 19 yoF former smoker followed for bilateral pulmonary infiltrates, incr C-ANCA, s/p VATS bx/ organizing pneumonia, Metastatic intestinal carcinoid. Marland Kitchen Hx breast Ca 2006/ Dr Truddie Coco Husband here. She returns now after left lung biopsy. She has expected left thoracotomy pain but is otherwise stable as she continues maintenance prednisone. Denies blood, fever, swollen glands, discolored sputum. Exertional dyspnea is not worse, allowing for the surgery. I shared with her and her husband the available initial pathology: Patchy organizing pneumonia with chronic interstitial pneumonia and focal atypical glands. I reviewed the discussion about her at thoracic oncology conference. The parenchymal lung process is not a vasculitis as I originally questioned. I told her there was an incidental finding of cancer cells with further identification pending but that this was a separate process. We agreed to refer her to Dr. Truddie Coco who managed her breast cancer. After she had left, personal communication from Dr. Jimmy Footman- neuroendocrine tumor staining consistent with metastatic intestinal carcinoid.  06/20/11  46 yoF former smoker followed for bilateral pulmonary infiltrates, incr C-ANCA, s/p VATS bx/ organizing pneumonia, Metastatic intestinal carcinoid. Marland Kitchen Hx breast Ca 2006/ Dr Truddie Coco   Husband here She says her breathing is now "fine" back to her normal. She denies fever, cough, sputum. I spoke with Mrs. Lennartz myself and had to explain the discovery of carcinoid after her lung biopsy. We also have the note from the [facility] indicating they discussed it with her. She has absolutely no recollection. Little cough or shortness of breath now. Still a little posterior thoracotomy incision pain. CT 05/19/11-  images reviewed with them- IMPRESSION:  1. Marked improvement in bilateral air space disease seen on  comparison CT of 03/24/2011. Near complete resolution.  2. Postoperative change in the left hemithorax consistent with  recent VATS.  Original Report Authenticated By: Suzy Bouchard, M.D.   11/07/11- 66 yoF former smoker followed for bilateral pulmonary infiltrates, incr C-ANCA, s/p VATS bx/ organizing pneumonia, Metastatic intestinal carcinoid. Marland Kitchen Hx breast Ca 2006/ Dr Truddie Coco    c/o  Chronic vertigo, difficulty breathing and gets tired easy. Also some wheezing, post nasal,  and cough.  Some days she feels much better than others, no real pattern. Occasional light cough to clear throat. We had called in prednisone, which did help. Ambulatory around home, but gets out little. CT chest 09/09/11- I reviewed w/ her- there is significant patchy  bilateral lower zone fibrotic scarring and some bronchial thickening. IMPRESSION:  1. No evidence of thoracic metastatic disease or other active  process.  2. Small mild cardiomegaly and hiatal hernia.  Original Report Authenticated By: Marlaine Hind, M.D.    02/09/12-  7 yoF former smoker followed for bilateral pulmonary infiltrates, incr C-ANCA, s/p VATS bx/ cryptogenic organizing pneumonia/ BOOP, Metastatic intestinal carcinoid. Marland Kitchen Hx breast Ca 2006/ Dr Truddie Coco  January had biopsy to ck for other lung condition. SOB and wheezing at times. Left sided pain around the  Back. Continues Advair 250. Due for followup chest CT in December for Dr. Jacquiline Doe. Not on prednisone. She says breathing is unchanged. Denies pain, blood or swollen nodes.   05/11/12-  47 yoF former smoker followed for bilateral pulmonary infiltrates, incr C-ANCA, s/p VATS bx/ cryptogenic organizing pneumonia/ BOOP, Metastatic intestinal carcinoid. Complicated by Hx breast Ca 2006/ Dr Truddie Coco. AFib/ Dr Martinique, DM. She says her husband is developing dementia. Deep breath makes her left flank feel  tight where she had thoracentesis. Complains of itching since starting Eliquis.Atarax 50 mg was too strong and Benadryl did not help. Itching keeps her awake all night. No visible rash. She is convinced really unpleasant itching began when she started Eliquis. I explained this medication needs to be transitioned carefully to anything else and Dr. Martinique would need to be the one to manage that. CT chest 03/26/12 . IMPRESSION:  1. No findings to strongly suggest metastatic disease to the  thorax.  2. There is a new focus of nodular ground-glass attenuation  measuring 1 cm in the anterior aspect of the left upper lobe which  is highly nonspecific. Initial follow-up by chest CT without  contrast is recommended in 3 months to confirm persistence. This  recommendation follows the consensus statement: Recommendations for  the Management of Subsolid Pulmonary Nodules Detected at CT: A  Statement from the Fleischner Society as published in Radiology  2013; 266:304-317.  3. Atherosclerosis, including left main and three-vessel coronary  artery disease. Assessment for potential risk factor  modification, dietary therapy or pharmacologic therapy may be  warranted, if clinically indicated.  4. Slight increase in the small amount of pericardial fluid  compared to the prior examination. This is unlikely to be of  hemodynamic significance at this time.  5. Moderate cardiomegaly.  6. Additional incidental findings, similar to prior studies, as  discussed above.  Original Report Authenticated By: Vinnie Langton, M.D.   08/08/12- 15 yoF former smoker followed for bilateral pulmonary infiltrates/ nodules, incr C-ANCA, s/p VATS bx/ cryptogenic organizing pneumonia/ BOOP, Metastatic intestinal carcinoid. Complicated by Hx breast Ca 2006/ Dr Truddie Coco. AFib/ Dr Martinique, DM. Itching stopped, while continuing Eliquis. She had changed detergents and fabric softener, but cause unknown. Seasonal rhinitis symptoms now.  Total IgE 05/11/2012 was less than 1.5. New burning itch, intermittent, right lateral ribs x2 weeks. Has had shingles shot. No visible rash. CT chest 08/02/12 IMPRESSION:  Interval persistence but decrease in size of the ground-glass  nodules seen in the anterior left upper lobe. Adenocarcinoma  cannot be excluded. Followup by CT is recommended in 12 months,  with continued annual surveillance for a minimum of 3 years.  These recommendations are taken from "Recommendations for the  Management of Subsolid Pulmonary Nodules Detected at CT: A  Statement from the [place]" Radiology 2013; 266:1, 304-  317.  Original Report Authenticated By: Misty Stanley, M.D.   11/19/2012 [facility] follow up  Patient presents for a post hospital  followup Admitted 11/01/2012 - 11/08/2012 for  Granulomatous Polyangitis (former Wegener). with Alveolar Hemorrhage. Relapse since 2013 11/02/12 PR-3 880. MPO negative. - GBM neg Likely - capillaritis. 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 Admitted with hemoptysis  hemoptysis with CT scan chest findings of alveolar hemorrhage  As of June 2014 the oncologist did not feel there was any evidence of breast cancer recurrence.CT scan of the chest ruled out pulmonary embolism but showed severe bilateral groundglass opacities consistent with alveolar hemorrhage pattern. Pulmonary consultation was called. She was initially treated by stopping eliquis, providing empiric antibiotics and supported with diuresis.  Pulmonary  felt The clinical picture was felt to be consistent with diffuse alveolar hemorrhage resulting in hemoptysis. Most likely cause of alveolar hemorrhage was felt to be autoimmune based on past history of autoimmune antibody positivity and lung biopsy showing organizing pneumonia in December 2012/January 2013.  Prior C.-ANCA positive though no evidence of vasculitis in Jan 2013 lung bx which is puzzling. Alternative is relapsing BOOP but the hemoptysis would be unsual unless precipitated by Eliquis. On 11/02/12 PR-3 880. Nephrology consult  On 7/29 immunosuppression therapy was initiated. She was given Pulse methylprednisolone, for three days, and her first dose of Rituxan was given on 7/30. The following days we saw significant clinical improvement in regards to hemoptysis, pulmonary infiltrates and dyspnea, She was started on  Rituxan over cyclophosphamide due to malignancy history, and also this is technically a "relapse" or A "flare". Dose starting 11/03/12 will be Induction therapy with rituximab (375 mg/m2 per week for four weeks). W/  PCP prophylaxis w/ - Bactrim 1 DS on M, W, F  Since discharge she is feeling better with resolution of hemoptysis, . Dyspnea is less and she is starting to get some of her energy back.  No fever , orthopnea or edema.  Had labs done last week. , hbg stable at 8.9 , renal fx tr down at 1.6 (max scr 2.4) . Ov with oncology last week. Rituxan  infusion given.   12/03/12- 89 yoF former smoker followed for bilateral pulmonary infiltrates/ nodules, incr C-ANCA, s/p VATS bx/ Granulomatous Polyangiitis (Wegener's) vs cryptogenic organizing pneumonia/ BOOP, Metastatic intestinal carcinoid. Complicated by Hx breast Ca 2006/ Dr Truddie Coco. AFib/ Dr Martinique, DM. Started Rituxan 375 mg/m2x 4 weeks 11/03/12. PCP prophy Bactrim 1DS on M,W,F. FOLLOWS FOR: reports doing well since ov w/ TP.  still doing well on Dulera, but needs a refill.  still taking prednisone 25m daily. 1 dose of Rituxan still pending. Had to have transfusion last week for hemoglobin 7.7. No hemoptysis since a little bit at first recognition of her granulomatous disease. She has continued prednisone 60 mg daily since hospital discharge, and we have discussed tapering the dose. No coughing, no chest pain or fever.  01/17/13- 89 yoF former smoker followed for bilateral pulmonary infiltrates/ nodules, incr C-ANCA, s/p VATS bx/ Granulomatous Polyangiitis (Wegener's) vs cryptogenic organizing pneumonia/ BOOP, Metastatic intestinal carcinoid. Complicated by Hx breast Ca 2006/ Dr RTruddie Coco AFib/ Dr JMartinique DM. FOLLOWS FOR:  Breathing has improved since last OV needs refill on dulera Had flu vaccine Paces herself. Feels she is getting stronger. Has not needed Dulera in 2 or 3 weeks.  04/22/14- 75yoF former smoker followed for bilateral pulmonary infiltrates/ nodules, incr C-ANCA, s/p VATS bx/ Granulomatous Polyangiitis (Wegener's) vs cryptogenic organizing pneumonia/ BOOP, Metastatic intestinal  carcinoid. Complicated by Hx breast Ca 2006/ Dr Truddie Coco. AFib/ Dr Martinique, DM. FOLLOWS FOR: recently in hospital; bronchitis Hospital 1/7-1/12/15- acute bronchitis/respiratory failure/probably viral. There may have been fluid overload, she feels better now on daily Lasix. Prednisone now at 1/2x20 mg daily. CT chest 04/14/13 IMPRESSION:  1. Stable cardiac enlargement and coronary artery calcifications.  2. Patchy ground-glass opacity in both lungs with vague nodularity.  Findings suggest a partial airspace filling process such as edema,  hemorrhage or inflammatory or infectious alveolitis. No focal  airspace consolidation.  3. Possible new cystic lesion in the pancreatic body/tail junction  region. Please see above discussion.  Electronically Signed  By: Kalman Jewels M.D.  On: 04/14/2013 10:56  05/27/13- 29 yoF former smoker followed for bilateral pulmonary infiltrates/ nodules, incr C-ANCA, s/p VATS bx/ Granulomatous Polyangiitis (Wegener's) vs cryptogenic organizing pneumonia/ BOOP, Metastatic intestinal carcinoid. Complicated by Hx breast Ca 2006/ Dr Truddie Coco. AFib/ Dr Martinique, DM. FOLLOWS FOR:  Breathing doing well-- no concerns today Minimal dry cough. Not needing Dulera or O2. Neb BID helps. CXR 05/09/13 1V FINDINGS:  Marked  cardiopericardial enlargement. Unchanged tortuosity of the  aorta. The lungs are hyperinflated chronically. There is pulmonary  venous congestion without edema or effusion. No pneumothorax. No  asymmetric opacity.  IMPRESSION:  Pulmonary venous congestion without edema or effusion.  Electronically Signed  By: Jorje Guild M.D.  On: 05/09/2013 05:54 ECHO 04/14/13 Study Conclusions - Left ventricle: The cavity size was normal. Wall thickness was increased in a pattern of mild LVH. Systolic function was normal. The estimated ejection fraction was in the range of 60% to 65%. - Mitral valve: Mild to moderate regurgitation. - Left atrium: The atrium was moderately to severely dilated. - Right ventricle: The cavity size was mildly to moderately dilated. Wall thickness was normal. Systolic function was mildly reduced. - Right atrium: The atrium was severely dilated. - Tricuspid valve: Moderate regurgitation. - Pulmonary arteries: PA peak pressure: 53m Hg (S). Transthoracic echocardiography. M-mode, complete 2D, spectral Doppler, and color Doppler. Height: Height: 166.4cm. Height: 65.5in. Weight: Weight: 94.3kg. Weight: 207.5lb. Body mass index: BMI: 34.1kg/m^2. Body surface area: BSA: 2.123m. Blood pressure: 145/72. Patient status: Inpatient. Location: Bedside.  07/19/13- [age]oF former smoker followed for bilateral pulmonary infiltrates/ nodules, incr C-ANCA, s/p VATS bx/ Granulomatous Polyangiitis (Wegener's) vs cryptogenic organizing pneumonia/ BOOP, Metastatic intestinal carcinoid. Complicated by Hx breast Ca 2006/ Dr RuTruddie CocoAFib/ Dr JoMartiniqueDM. FOLLOWS FOR: had to use nebulizer treatment due to sitting outside with increased pollen; feels better now and no new complaints Pollen caused cough and watery nose. Nebulizer helps with occasional use. Continues maintenance prednisone 1/2 x 20 mg daily.  ROS-see HPI Constitutional:   No-   weight loss, night sweats, fevers, chills, fatigue,  lassitude. HEENT:   No-  headaches, difficulty swallowing, tooth/dental problems, sore throat,       No-  sneezing, itching, ear ache, nasal congestion, post nasal drip,  CV: No -chest pain, orthopnea, PND, swelling in lower extremities, anasarca,   + chronic dizziness,   No-palpitations Resp: +   shortness of breath with exertion or at rest.           No- productive cough,  + non-productive cough,               No-   change in color of mucus.  No- wheezing.   Skin: No-   rash or lesions. no- pruritus GI:  No-   heartburn, indigestion, abdominal pain, nausea, vomiting,  GU: MS:  No-   joint pain or swelling.  Neuro-     +Vertigo- uses wheelchair Psych:  No- change in mood or affect. No depression or anxiety.  No memory loss.  OBJ General- Alert, Oriented, Affect-appropriate, Distress- none acute, overweight, wheelchair Skin- rash-none, lesions- none, excoriation- none Lymphadenopathy- none Head- atraumatic            Eyes- Gross vision intact, PERRLA, conjunctivae -pale            Ears- Hearing, canals-normal            Nose- turbinate edema, no-Septal dev, mucus, polyps, erosion, perforation             Throat- Mallampati II-III , mucosa clear , drainage- none, tonsils- atrophic Neck- flexible , trachea midline, no stridor , thyroid nl, carotid no bruit Chest - symmetrical excursion , unlabored           Heart/CV-+ irregularly irregular , 2-3/6 precordial systolic murmur , no gallop  , no rub, nl               s1 s2                           - JVD+full , edema+1, stasis changes- none, varices- none           Lung- +few crackles, no wheeze , cough+raspy , dullness-none, rub- none           Chest wall- healed VATS thoracotomy incision L Abd-  Br/ Gen/ Rectal- Not done, not indicated Extrem- cyanosis- none, clubbing, none, atrophy- none, strength- nl. Wheelchair for distance/ cane at home.  Neuro- grossly intact to observation

## 2013-07-19 NOTE — Patient Instructions (Signed)
Order- CXR  Dx granuolomatous inflammation/ Wegener's, Hx L breast Ca  Try reducing prednisone to alternate 10 mg with 5 mg (1/4 of a 20 mg tab) every other day for 3 weeks, then reduce to 5 mg every day until next office visit.

## 2013-07-22 ENCOUNTER — Telehealth: Payer: Self-pay | Admitting: Family Medicine

## 2013-07-22 MED ORDER — INSULIN GLARGINE 100 UNIT/ML SOLOSTAR PEN: 10.0000 [IU] | PEN_INJECTOR | Freq: Every day | SUBCUTANEOUS | Status: DC

## 2013-07-22 MED ORDER — INSULIN ASPART 100 UNIT/ML FLEXPEN: PEN_INJECTOR | SUBCUTANEOUS | Status: DC

## 2013-07-22 NOTE — Telephone Encounter (Signed)
Pt informed.

## 2013-07-22 NOTE — Telephone Encounter (Signed)
Because of her kidney dysfunction we cannot use metformin.  Glipizide could still be option but she would be higher risk of complications with her kidney issues.

## 2013-07-22 NOTE — Telephone Encounter (Signed)
Patient calling about discontinuing her Novalog pin and returning to po medications. Had been on Glipizide 50m BID and Metformin 5055m Tabs 2 BID and felt she was doing well on those pills. Since being in hospital in Feb 2015, Glipizide and Metformin were discontinued and she was given a sliding scale for Novalog pin, checks blood sugars TID before meals and usually takes 3 or 6 units of insulin.   With both oral meds and Novalog has been taking Lantus 10 units at HS. Also since hospitalization is on low sodium and no sugar diet. Wanting to stop taking the Novalog pin and restart the Glipizide and Metformin instead. Please review and address this issue with patient at  [phone]

## 2013-07-26 ENCOUNTER — Telehealth: Payer: Self-pay | Admitting: Family Medicine

## 2013-07-26 ENCOUNTER — Ambulatory Visit (INDEPENDENT_AMBULATORY_CARE_PROVIDER_SITE_OTHER): Payer: Medicare Other | Admitting: Family Medicine

## 2013-07-26 ENCOUNTER — Encounter: Payer: Self-pay | Admitting: Family Medicine

## 2013-07-26 VITALS — BP 130/70 | HR 68 | Temp 98.6°F | Wt 203.0 lb

## 2013-07-26 DIAGNOSIS — I10 Essential (primary) hypertension: Secondary | ICD-10-CM

## 2013-07-26 DIAGNOSIS — R059 Cough, unspecified: Secondary | ICD-10-CM | POA: Diagnosis not present

## 2013-07-26 DIAGNOSIS — R05 Cough: Secondary | ICD-10-CM | POA: Diagnosis not present

## 2013-07-26 MED ORDER — GLIMEPIRIDE 2 MG PO TABS: 2.0000 mg | ORAL_TABLET | Freq: Every day | ORAL | Status: DC

## 2013-07-26 NOTE — Progress Notes (Signed)
Pre visit review using our clinic review tool, if applicable. No additional management support is needed unless otherwise documented below in the visit note.

## 2013-07-26 NOTE — Telephone Encounter (Signed)
Pt states dr Elease Hashimoto had given her samples of novolog flexpen last wed. Now dr has taken her off of that and put her on Insulin Glargine (LANTUS SOLOSTAR) 100 UNIT/ML Solostar Pen Pt has not opened her flexpen, (kept in the fridge) and would like to know if you could trade w/ her. Pt would like samples of Insulin Glargine (LANTUS SOLOSTAR) 100 UNIT/ML Solostar Pen

## 2013-07-26 NOTE — Patient Instructions (Addendum)
Begin using Dulera 2 puffs twice a day to help your lungs. Rinse mouth out after use. If you start to have a fever call the office right away. Discontinue Novolog and start taking glimepiride for diabetes.  3 month follow-up appointment.

## 2013-07-26 NOTE — Progress Notes (Signed)
Subjective:    Patient ID: [name] [name], female    DOB: [birth year], 80 y.o.   MRN: [id number]  Cough Associated symptoms include wheezing. Pertinent negatives include no chest pain, chills, headaches or shortness of breath.   Patient seen for medical followup. She has multiple issues today. She's generally not feeling well she had some mild nausea this morning but no vomiting. She had some increased cough with past week. Last week had x-ray per pulmonary which showed no acute findings. She's had some recent mild weight gain. Intermittent constipation. Her cough is occasionally been productive but no fever. No dyspnea.  Type 2 diabetes exacerbated by prednisone. Recent A1c 7.0%. Patient called last week requesting to go back on oral medications. We explained metformin was not an option with her creatinine over 1.5. She specially would like to get rid of NovoLog injections. She remains on Lantus 10 units once daily was taking very low dose of NovoLog with meals. Previously took glipizide.  She has history of Wegener's granulomatosis does have some intermittent wheezing. She is prescribed Dulera but is not taking that currently. She takes albuterol as needed. She has history of atrial fibrillation remains anticoagulated on eliquis  Past Medical History  Diagnosis Date  . HYPOTHYROIDISM 07/24/2008  . HYPERLIPIDEMIA 07/24/2008  . DEPRESSION 05/16/2009  . HYPERTENSION 07/24/2008  . Vertigo   . Diabetes mellitus type II   . Arthritis     r tkr  . CVA (cerebral vascular accident) 2008    mini stroke.no residual  . Heart murmur     stress test 2009.dr peter Martinique  . Intermittent vertigo   . Skin abnormality     facial lesions .pt applying mupiracin to areas  . Atrial fibrillation   . Breast cancer   . Pneumonia   . 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 Knee surgery  2009    TKR  . Cholecystectomy  1980  . Joint replacement      r knee   . Breast surgery  2007    Lumpectomy, XRT 2006.l breast  . Video assisted thoracoscopy  04/22/2011    Procedure: VIDEO ASSISTED THORACOSCOPY;  Surgeon: Melrose Nakayama, MD;  Location: [facility];  Service: Thoracic;  Laterality: Left;  WITH BIOPSY  . Heel spur surgery Right   . Exploratory laparotomy      reports that she quit smoking about 31 years ago. Her smoking use included Cigarettes. She has a 6 pack-year smoking history. She has never used smokeless tobacco. She reports that she does not drink alcohol or use illicit drugs. family history includes Arthritis in her mother; Breast cancer in her sister; Cancer in her sister; Coronary artery disease in her brother and father; Heart disease in her father; Lung cancer in her brother; Rheum arthritis in her mother. Allergies  Allergen Reactions  . Codeine Sulfate Other (See Comments)    REACTION: GI upset  . Sulfonamide Derivatives Other (See Comments)    REACTION: GI upset  . Atarax [Hydroxyzine] Nausea Only and Rash      Review of Systems  Constitutional: Negative for chills, fatigue and unexpected weight change.  Eyes: Negative for visual disturbance.  Respiratory: Positive for cough and wheezing. Negative for chest tightness and shortness of breath.   Cardiovascular: Negative for chest pain, palpitations and leg swelling.  Gastrointestinal: Positive for nausea and constipation. Negative for vomiting and diarrhea.  Endocrine: Negative for polydipsia and polyuria.  Genitourinary:  Negative for frequency.  Neurological: Negative for dizziness, seizures, syncope, weakness, light-headedness and headaches.       Objective:   Physical Exam  Constitutional: She appears well-developed and well-nourished.  Neck: Neck supple. No thyromegaly present.  Cardiovascular: Normal rate and regular rhythm.   Murmur heard. Pulmonary/Chest: Effort normal and breath sounds normal. No respiratory distress. She has no wheezes. She has no rales.    Musculoskeletal: She exhibits edema.  Trace pitting edema legs bilaterally          Assessment & Plan:  #1 type 2 diabetes. History of good control. We will try to simplify. Discontinue NovoLog since she is requiring very minimal amount. Continue Lantus and start Amaryl 2 mg once daily. Caution about possible hypoglycemia. Recheck A1c a followup #2 cough with some increased wheezing. She's not any fever or other indication of pneumonia. She is currently not taking her steroid inhaler and is encouraged to get back on Dulera 2 puffs twice daily. Recent chest x-ray last week unremarkable #3 hypertension which is stable at goal today

## 2013-07-27 ENCOUNTER — Telehealth: Payer: Self-pay | Admitting: Family Medicine

## 2013-07-27 NOTE — Telephone Encounter (Signed)
Pt informed that she can bring the sample back. Per Dr. Elease Hashimoto to change to Levemir Flexpen.

## 2013-07-27 NOTE — Telephone Encounter (Signed)
Pt has no instructions on her new med for insulin. Pt would like to know how much and when. Please call

## 2013-07-27 NOTE — Telephone Encounter (Signed)
Pt informed.

## 2013-07-28 ENCOUNTER — Ambulatory Visit: Payer: Medicare Other | Admitting: Family Medicine

## 2013-08-09 ENCOUNTER — Encounter: Payer: Self-pay | Admitting: Family Medicine

## 2013-08-09 ENCOUNTER — Ambulatory Visit (INDEPENDENT_AMBULATORY_CARE_PROVIDER_SITE_OTHER): Payer: Medicare Other | Admitting: Family Medicine

## 2013-08-09 VITALS — BP 140/70 | HR 85 | Temp 99.0°F | Wt 203.0 lb

## 2013-08-09 DIAGNOSIS — H1045 Other chronic allergic conjunctivitis: Secondary | ICD-10-CM

## 2013-08-09 DIAGNOSIS — R05 Cough: Secondary | ICD-10-CM | POA: Diagnosis not present

## 2013-08-09 DIAGNOSIS — E119 Type 2 diabetes mellitus without complications: Secondary | ICD-10-CM

## 2013-08-09 DIAGNOSIS — R059 Cough, unspecified: Secondary | ICD-10-CM | POA: Diagnosis not present

## 2013-08-09 DIAGNOSIS — H1010 Acute atopic conjunctivitis, unspecified eye: Secondary | ICD-10-CM

## 2013-08-09 NOTE — Progress Notes (Signed)
Subjective:    Patient ID: [name] [name], female    DOB: [birth year], 80 y.o.   MRN: [id number]  Cough Associated symptoms include postnasal drip and rhinorrhea. Pertinent negatives include no chest pain, chills, fever or wheezing.   Patient seen for the following issues:  Cough for 2-3 weeks. Mostly dry. She has had significant nasal congestive symptoms and sneezing. Has also developed some watery itchy eyes symptoms past few days. No blurred vision.  Has not taken any eye drops.  no purulent discharge.  Symptoms are worse after sitting outside. She's not taking Claritin. She has taken Flonase in the past. She has Dulera but has not take this regularly. Her cough has been mostly dry.  No fever or chills.  Type 2 diabetes. Recent fasting blood sugars ranging 97-105. No hypoglycemia. She takes low-dose glyburide 2 mg once daily and Lantus. She discontinue short acting insulin and sugars have remained stable.  Past Medical History  Diagnosis Date  . HYPOTHYROIDISM 07/24/2008  . HYPERLIPIDEMIA 07/24/2008  . DEPRESSION 05/16/2009  . HYPERTENSION 07/24/2008  . Vertigo   . Diabetes mellitus type II   . Arthritis     r tkr  . CVA (cerebral vascular accident) 2008    mini stroke.no residual  . Heart murmur     stress test 2009.dr peter Martinique  . Intermittent vertigo   . Skin abnormality     facial lesions .pt applying mupiracin to areas  . Atrial fibrillation   . Breast cancer   . Pneumonia   . 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 Knee surgery  2009    TKR  . Cholecystectomy  1980  . Joint replacement      r knee  . Breast surgery  2007    Lumpectomy, XRT 2006.l breast  . Video assisted thoracoscopy  04/22/2011    Procedure: VIDEO ASSISTED THORACOSCOPY;  Surgeon: Melrose Nakayama, MD;  Location: [facility];  Service: Thoracic;  Laterality: Left;  WITH BIOPSY  . Heel spur surgery Right   . Exploratory laparotomy      reports that  she quit smoking about 31 years ago. Her smoking use included Cigarettes. She has a 6 pack-year smoking history. She has never used smokeless tobacco. She reports that she does not drink alcohol or use illicit drugs. family history includes Arthritis in her mother; Breast cancer in her sister; Cancer in her sister; Coronary artery disease in her brother and father; Heart disease in her father; Lung cancer in her brother; Rheum arthritis in her mother. Allergies  Allergen Reactions  . Codeine Sulfate Other (See Comments)    REACTION: GI upset  . Sulfonamide Derivatives Other (See Comments)    REACTION: GI upset  . Atarax [Hydroxyzine] Nausea Only and Rash      Review of Systems  Constitutional: Negative for fever and chills.  HENT: Positive for congestion, postnasal drip and rhinorrhea.   Eyes: Positive for itching. Negative for visual disturbance.  Respiratory: Positive for cough. Negative for wheezing.   Cardiovascular: Negative for chest pain, palpitations and leg swelling.  Neurological: Negative for dizziness.       Objective:   Physical Exam  Constitutional: She appears well-developed and well-nourished.  HENT:  Right Ear: External ear normal.  Left Ear: External ear normal.  Mouth/Throat: Oropharynx is clear and moist.  Eyes:  Conjunctiva are normal.  Neck: Neck supple. No thyromegaly present.  Cardiovascular: Normal rate.   Pulmonary/Chest:  Effort normal and breath sounds normal. No respiratory distress. She has no rales.  Musculoskeletal: She exhibits no edema.          Assessment & Plan:  #1 allergic conjunctivitis. Get back on regular use of Claritin. Consider addition of Patanol if not getting adequate relief with the above #2 cough possibly secondary to allergic postnasal drip. Get back on Flonase and Claritin. She does not have any fever or other asymmetric lung findings at this time #3 type 2 diabetes has been stable with recent medication changes. Recheck  A1c at followup in June

## 2013-08-09 NOTE — Patient Instructions (Addendum)

## 2013-08-09 NOTE — Progress Notes (Signed)
Pre visit review using our clinic review tool, if applicable. No additional management support is needed unless otherwise documented below in the visit note.

## 2013-08-11 ENCOUNTER — Telehealth: Payer: Self-pay | Admitting: Family Medicine

## 2013-08-11 MED ORDER — HYDROCODONE-HOMATROPINE 5-1.5 MG/5ML PO SYRP: 5.0000 mL | ORAL_SOLUTION | Freq: Four times a day (QID) | ORAL | Status: DC | PRN

## 2013-08-11 NOTE — Telephone Encounter (Signed)
Pt seen tues and dx w/ allergies. Pt is coughing up green mucas now. Has ongoing cough all day and which keeps her awake at night. Pt has headache,nausea, and is weak from not being able to eat. Pt would like antibiotic/ cough med is poss.  [facility]/ [address]

## 2013-08-11 NOTE — Telephone Encounter (Signed)
Pt aware that Rx is ready for pick up

## 2013-08-11 NOTE — Telephone Encounter (Signed)
Hycodan cough syrup 120 ml one tsp po q 6 hours prn cough

## 2013-08-17 ENCOUNTER — Telehealth: Payer: Self-pay

## 2013-08-17 NOTE — Telephone Encounter (Signed)
Relevant patient education mailed to patient.

## 2013-08-18 ENCOUNTER — Inpatient Hospital Stay (HOSPITAL_COMMUNITY)
Admission: EM | Admit: 2013-08-18 | Discharge: 2013-08-21 | DRG: 871 | Disposition: A | Discharge status: Home Health | Payer: Medicare Other | Attending: Internal Medicine | Admitting: Internal Medicine

## 2013-08-18 ENCOUNTER — Encounter (HOSPITAL_COMMUNITY): Payer: Self-pay | Admitting: Emergency Medicine

## 2013-08-18 ENCOUNTER — Emergency Department (HOSPITAL_COMMUNITY): Payer: Medicare Other

## 2013-08-18 DIAGNOSIS — E039 Hypothyroidism, unspecified: Secondary | ICD-10-CM | POA: Diagnosis present

## 2013-08-18 DIAGNOSIS — J9601 Acute respiratory failure with hypoxia: Secondary | ICD-10-CM | POA: Diagnosis present

## 2013-08-18 DIAGNOSIS — I10 Essential (primary) hypertension: Secondary | ICD-10-CM

## 2013-08-18 DIAGNOSIS — N184 Chronic kidney disease, stage 4 (severe): Secondary | ICD-10-CM | POA: Diagnosis present

## 2013-08-18 DIAGNOSIS — E871 Hypo-osmolality and hyponatremia: Secondary | ICD-10-CM | POA: Diagnosis not present

## 2013-08-18 DIAGNOSIS — R011 Cardiac murmur, unspecified: Secondary | ICD-10-CM | POA: Diagnosis present

## 2013-08-18 DIAGNOSIS — A419 Sepsis, unspecified organism: Secondary | ICD-10-CM | POA: Diagnosis not present

## 2013-08-18 DIAGNOSIS — M79609 Pain in unspecified limb: Secondary | ICD-10-CM | POA: Diagnosis not present

## 2013-08-18 DIAGNOSIS — E236 Other disorders of pituitary gland: Secondary | ICD-10-CM | POA: Diagnosis present

## 2013-08-18 DIAGNOSIS — D631 Anemia in chronic kidney disease: Secondary | ICD-10-CM | POA: Diagnosis present

## 2013-08-18 DIAGNOSIS — E1129 Type 2 diabetes mellitus with other diabetic kidney complication: Secondary | ICD-10-CM | POA: Diagnosis present

## 2013-08-18 DIAGNOSIS — E119 Type 2 diabetes mellitus without complications: Secondary | ICD-10-CM

## 2013-08-18 DIAGNOSIS — J440 Chronic obstructive pulmonary disease with acute lower respiratory infection: Secondary | ICD-10-CM | POA: Diagnosis present

## 2013-08-18 DIAGNOSIS — Z96659 Presence of unspecified artificial knee joint: Secondary | ICD-10-CM

## 2013-08-18 DIAGNOSIS — Z87891 Personal history of nicotine dependence: Secondary | ICD-10-CM

## 2013-08-18 DIAGNOSIS — R110 Nausea: Secondary | ICD-10-CM | POA: Diagnosis not present

## 2013-08-18 DIAGNOSIS — R5383 Other fatigue: Secondary | ICD-10-CM | POA: Diagnosis not present

## 2013-08-18 DIAGNOSIS — Z801 Family history of malignant neoplasm of trachea, bronchus and lung: Secondary | ICD-10-CM | POA: Diagnosis not present

## 2013-08-18 DIAGNOSIS — E785 Hyperlipidemia, unspecified: Secondary | ICD-10-CM | POA: Diagnosis present

## 2013-08-18 DIAGNOSIS — D649 Anemia, unspecified: Secondary | ICD-10-CM | POA: Diagnosis present

## 2013-08-18 DIAGNOSIS — J209 Acute bronchitis, unspecified: Secondary | ICD-10-CM | POA: Diagnosis present

## 2013-08-18 DIAGNOSIS — N039 Chronic nephritic syndrome with unspecified morphologic changes: Secondary | ICD-10-CM

## 2013-08-18 DIAGNOSIS — I5032 Chronic diastolic (congestive) heart failure: Secondary | ICD-10-CM | POA: Diagnosis present

## 2013-08-18 DIAGNOSIS — Z853 Personal history of malignant neoplasm of breast: Secondary | ICD-10-CM

## 2013-08-18 DIAGNOSIS — N058 Unspecified nephritic syndrome with other morphologic changes: Secondary | ICD-10-CM | POA: Diagnosis present

## 2013-08-18 DIAGNOSIS — I509 Heart failure, unspecified: Secondary | ICD-10-CM | POA: Diagnosis present

## 2013-08-18 DIAGNOSIS — I771 Stricture of artery: Secondary | ICD-10-CM | POA: Diagnosis not present

## 2013-08-18 DIAGNOSIS — J189 Pneumonia, unspecified organism: Secondary | ICD-10-CM

## 2013-08-18 DIAGNOSIS — J9600 Acute respiratory failure, unspecified whether with hypoxia or hypercapnia: Secondary | ICD-10-CM | POA: Diagnosis not present

## 2013-08-18 DIAGNOSIS — Z803 Family history of malignant neoplasm of breast: Secondary | ICD-10-CM

## 2013-08-18 DIAGNOSIS — Z8673 Personal history of transient ischemic attack (TIA), and cerebral infarction without residual deficits: Secondary | ICD-10-CM

## 2013-08-18 DIAGNOSIS — M3130 Wegener's granulomatosis without renal involvement: Secondary | ICD-10-CM | POA: Diagnosis not present

## 2013-08-18 DIAGNOSIS — K2990 Gastroduodenitis, unspecified, without bleeding: Secondary | ICD-10-CM

## 2013-08-18 DIAGNOSIS — Z8249 Family history of ischemic heart disease and other diseases of the circulatory system: Secondary | ICD-10-CM

## 2013-08-18 DIAGNOSIS — Z794 Long term (current) use of insulin: Secondary | ICD-10-CM

## 2013-08-18 DIAGNOSIS — I4891 Unspecified atrial fibrillation: Secondary | ICD-10-CM | POA: Diagnosis not present

## 2013-08-18 DIAGNOSIS — J449 Chronic obstructive pulmonary disease, unspecified: Secondary | ICD-10-CM | POA: Diagnosis present

## 2013-08-18 DIAGNOSIS — R5381 Other malaise: Secondary | ICD-10-CM | POA: Diagnosis not present

## 2013-08-18 DIAGNOSIS — K2970 Gastritis, unspecified, without bleeding: Secondary | ICD-10-CM | POA: Diagnosis present

## 2013-08-18 DIAGNOSIS — I129 Hypertensive chronic kidney disease with stage 1 through stage 4 chronic kidney disease, or unspecified chronic kidney disease: Secondary | ICD-10-CM | POA: Diagnosis present

## 2013-08-18 DIAGNOSIS — M25549 Pain in joints of unspecified hand: Secondary | ICD-10-CM | POA: Diagnosis not present

## 2013-08-18 DIAGNOSIS — IMO0002 Reserved for concepts with insufficient information to code with codable children: Secondary | ICD-10-CM | POA: Diagnosis not present

## 2013-08-18 DIAGNOSIS — M79642 Pain in left hand: Secondary | ICD-10-CM

## 2013-08-18 DIAGNOSIS — J181 Lobar pneumonia, unspecified organism: Secondary | ICD-10-CM

## 2013-08-18 DIAGNOSIS — M19049 Primary osteoarthritis, unspecified hand: Secondary | ICD-10-CM | POA: Diagnosis present

## 2013-08-18 DIAGNOSIS — R6889 Other general symptoms and signs: Secondary | ICD-10-CM | POA: Diagnosis not present

## 2013-08-18 LAB — COMPREHENSIVE METABOLIC PANEL
ALK PHOS: 72 U/L (ref 39–117)
ALT: 10 U/L (ref 0–35)
AST: 19 U/L (ref 0–37)
Albumin: 3.4 g/dL — ABNORMAL LOW (ref 3.5–5.2)
BILIRUBIN TOTAL: 0.6 mg/dL (ref 0.3–1.2)
BUN: 24 mg/dL — ABNORMAL HIGH (ref 6–23)
CHLORIDE: 87 meq/L — AB (ref 96–112)
CO2: 23 meq/L (ref 19–32)
Calcium: 9.3 mg/dL (ref 8.4–10.5)
Creatinine, Ser: 1.61 mg/dL — ABNORMAL HIGH (ref 0.50–1.10)
GFR calc Af Amer: 34 mL/min — ABNORMAL LOW (ref >90)
GFR, EST NON AFRICAN AMERICAN: 30 mL/min — AB (ref >90)
Glucose, Bld: 90 mg/dL (ref 70–99)
POTASSIUM: 4.2 meq/L (ref 3.7–5.3)
SODIUM: 126 meq/L — AB (ref 137–147)
Total Protein: 7.1 g/dL (ref 6.0–8.3)

## 2013-08-18 LAB — CBC WITH DIFFERENTIAL/PLATELET
BASOS ABS: 0 10*3/uL (ref 0.0–0.1)
Basophils Relative: 0 % (ref 0–1)
Eosinophils Absolute: 0.1 10*3/uL (ref 0.0–0.7)
Eosinophils Relative: 1 % (ref 0–5)
HEMATOCRIT: 24.6 % — AB (ref 36.0–46.0)
Hemoglobin: 8.3 g/dL — ABNORMAL LOW (ref 12.0–15.0)
LYMPHS PCT: 13 % (ref 12–46)
Lymphs Abs: 1.4 10*3/uL (ref 0.7–4.0)
MCH: 28.9 pg (ref 26.0–34.0)
MCHC: 33.7 g/dL (ref 30.0–36.0)
MCV: 85.7 fL (ref 78.0–100.0)
Monocytes Absolute: 1.5 10*3/uL — ABNORMAL HIGH (ref 0.1–1.0)
Monocytes Relative: 14 % — ABNORMAL HIGH (ref 3–12)
Neutro Abs: 7.8 10*3/uL — ABNORMAL HIGH (ref 1.7–7.7)
Neutrophils Relative %: 72 % (ref 43–77)
PLATELETS: 310 10*3/uL (ref 150–400)
RBC: 2.87 MIL/uL — ABNORMAL LOW (ref 3.87–5.11)
RDW: 13.8 % (ref 11.5–15.5)
WBC: 10.8 10*3/uL — AB (ref 4.0–10.5)

## 2013-08-18 LAB — URINALYSIS, ROUTINE W REFLEX MICROSCOPIC
Bilirubin Urine: NEGATIVE
Glucose, UA: NEGATIVE mg/dL
Ketones, ur: NEGATIVE mg/dL
Nitrite: NEGATIVE
PROTEIN: NEGATIVE mg/dL
Specific Gravity, Urine: 1.009 (ref 1.005–1.030)
Urobilinogen, UA: 0.2 mg/dL (ref 0.0–1.0)
pH: 6 (ref 5.0–8.0)

## 2013-08-18 LAB — I-STAT TROPONIN, ED: Troponin i, poc: 0.01 ng/mL (ref 0.00–0.08)

## 2013-08-18 LAB — BASIC METABOLIC PANEL
BUN: 21 mg/dL (ref 6–23)
CALCIUM: 8.7 mg/dL (ref 8.4–10.5)
CO2: 24 mEq/L (ref 19–32)
Chloride: 88 mEq/L — ABNORMAL LOW (ref 96–112)
Creatinine, Ser: 1.51 mg/dL — ABNORMAL HIGH (ref 0.50–1.10)
GFR calc Af Amer: 37 mL/min — ABNORMAL LOW (ref >90)
GFR calc non Af Amer: 32 mL/min — ABNORMAL LOW (ref >90)
GLUCOSE: 112 mg/dL — AB (ref 70–99)
Potassium: 4.2 mEq/L (ref 3.7–5.3)
Sodium: 125 mEq/L — ABNORMAL LOW (ref 137–147)

## 2013-08-18 LAB — BLOOD GAS, ARTERIAL
ACID-BASE EXCESS: 0.2 mmol/L (ref 0.0–2.0)
BICARBONATE: 23.3 meq/L (ref 20.0–24.0)
Drawn by: 331471
O2 Content: 2 L/min
O2 SAT: 96.3 %
Patient temperature: 98.6
TCO2: 22 mmol/L (ref 0–100)
pCO2 arterial: 33.5 mmHg — ABNORMAL LOW (ref 35.0–45.0)
pH, Arterial: 7.457 — ABNORMAL HIGH (ref 7.350–7.450)
pO2, Arterial: 80.8 mmHg (ref 80.0–100.0)

## 2013-08-18 LAB — URINE MICROSCOPIC-ADD ON

## 2013-08-18 LAB — GLUCOSE, CAPILLARY
Glucose-Capillary: 106 mg/dL — ABNORMAL HIGH (ref 70–99)
Glucose-Capillary: 150 mg/dL — ABNORMAL HIGH (ref 70–99)

## 2013-08-18 LAB — PROTIME-INR
INR: 1.7 — AB (ref 0.00–1.49)
PROTHROMBIN TIME: 19.5 s — AB (ref 11.6–15.2)

## 2013-08-18 LAB — PRO B NATRIURETIC PEPTIDE: PRO B NATRI PEPTIDE: 14417 pg/mL — AB (ref 0–450)

## 2013-08-18 LAB — I-STAT CG4 LACTIC ACID, ED: Lactic Acid, Venous: 0.97 mmol/L (ref 0.5–2.2)

## 2013-08-18 MED ORDER — LORATADINE 10 MG PO TABS
10.0000 mg | ORAL_TABLET | Freq: Every day | ORAL | Status: DC | PRN
  Filled 2013-08-18: qty 1

## 2013-08-18 MED ORDER — IPRATROPIUM BROMIDE 0.02 % IN SOLN: 0.5000 mg | Freq: Four times a day (QID) | RESPIRATORY_TRACT | Status: DC

## 2013-08-18 MED ORDER — PREDNISONE 2.5 MG PO TABS
2.5000 mg | ORAL_TABLET | ORAL | Status: DC
  Administered 2013-08-19 – 2013-08-21 (×2): 2.5 mg via ORAL
  Filled 2013-08-18 (×2): qty 1

## 2013-08-18 MED ORDER — ACETAMINOPHEN 325 MG PO TABS
650.0000 mg | ORAL_TABLET | Freq: Once | ORAL | Status: AC
Start: 2013-08-18 — End: 2013-08-18
  Administered 2013-08-18: 650 mg via ORAL
  Filled 2013-08-18: qty 2

## 2013-08-18 MED ORDER — GUAIFENESIN ER 600 MG PO TB12
600.0000 mg | ORAL_TABLET | Freq: Two times a day (BID) | ORAL | Status: DC
  Administered 2013-08-18 – 2013-08-21 (×7): 600 mg via ORAL
  Filled 2013-08-18 (×8): qty 1

## 2013-08-18 MED ORDER — DEXTROSE 5 % IV SOLN
1.0000 g | Freq: Once | INTRAVENOUS | Status: AC
  Administered 2013-08-18: 1 g via INTRAVENOUS
  Filled 2013-08-18: qty 10

## 2013-08-18 MED ORDER — ATORVASTATIN CALCIUM 10 MG PO TABS
10.0000 mg | ORAL_TABLET | Freq: Every day | ORAL | Status: DC
  Administered 2013-08-18 – 2013-08-20 (×3): 10 mg via ORAL
  Filled 2013-08-18 (×4): qty 1

## 2013-08-18 MED ORDER — INSULIN GLARGINE 100 UNIT/ML ~~LOC~~ SOLN
10.0000 [IU] | Freq: Every day | SUBCUTANEOUS | Status: DC
  Administered 2013-08-18 – 2013-08-20 (×3): 10 [IU] via SUBCUTANEOUS
  Filled 2013-08-18 (×4): qty 0.1

## 2013-08-18 MED ORDER — GABAPENTIN 100 MG PO CAPS
100.0000 mg | ORAL_CAPSULE | Freq: Two times a day (BID) | ORAL | Status: DC
  Administered 2013-08-18 – 2013-08-21 (×6): 100 mg via ORAL
  Filled 2013-08-18 (×8): qty 1

## 2013-08-18 MED ORDER — VITAMIN B-6 100 MG PO TABS
100.0000 mg | ORAL_TABLET | Freq: Every morning | ORAL | Status: DC
  Administered 2013-08-18 – 2013-08-21 (×4): 100 mg via ORAL
  Filled 2013-08-18 (×4): qty 1

## 2013-08-18 MED ORDER — IPRATROPIUM-ALBUTEROL 0.5-2.5 (3) MG/3ML IN SOLN
3.0000 mL | Freq: Once | RESPIRATORY_TRACT | Status: AC
  Administered 2013-08-18: 3 mL via RESPIRATORY_TRACT
  Filled 2013-08-18: qty 3

## 2013-08-18 MED ORDER — IPRATROPIUM-ALBUTEROL 0.5-2.5 (3) MG/3ML IN SOLN
RESPIRATORY_TRACT | Status: AC
Start: 2013-08-18 — End: 2013-08-19
  Filled 2013-08-18: qty 3

## 2013-08-18 MED ORDER — SODIUM CHLORIDE 0.9 % IV SOLN: 1000.0000 mL | INTRAVENOUS | Status: DC

## 2013-08-18 MED ORDER — LEVOTHYROXINE SODIUM 125 MCG PO TABS
125.0000 ug | ORAL_TABLET | Freq: Every day | ORAL | Status: DC
  Administered 2013-08-19 – 2013-08-20 (×2): 125 ug via ORAL
  Filled 2013-08-18 (×6): qty 1

## 2013-08-18 MED ORDER — ALBUTEROL SULFATE (2.5 MG/3ML) 0.083% IN NEBU: 2.5000 mg | INHALATION_SOLUTION | Freq: Four times a day (QID) | RESPIRATORY_TRACT | Status: DC

## 2013-08-18 MED ORDER — ONDANSETRON HCL 4 MG PO TABS
4.0000 mg | ORAL_TABLET | Freq: Four times a day (QID) | ORAL | Status: DC | PRN
  Administered 2013-08-21: 4 mg via ORAL
  Filled 2013-08-18: qty 1

## 2013-08-18 MED ORDER — IRBESARTAN 300 MG PO TABS
300.0000 mg | ORAL_TABLET | Freq: Every day | ORAL | Status: DC
  Administered 2013-08-18 – 2013-08-20 (×3): 300 mg via ORAL
  Filled 2013-08-18 (×4): qty 1

## 2013-08-18 MED ORDER — FUROSEMIDE 20 MG PO TABS
20.0000 mg | ORAL_TABLET | Freq: Every day | ORAL | Status: DC
  Administered 2013-08-18 – 2013-08-21 (×4): 20 mg via ORAL
  Filled 2013-08-18 (×4): qty 1

## 2013-08-18 MED ORDER — PREDNISONE 5 MG PO TABS: 5.0000 mg | ORAL_TABLET | Freq: Every day | ORAL | Status: DC

## 2013-08-18 MED ORDER — ALBUTEROL SULFATE (2.5 MG/3ML) 0.083% IN NEBU: 2.5000 mg | INHALATION_SOLUTION | RESPIRATORY_TRACT | Status: DC | PRN

## 2013-08-18 MED ORDER — ACETAMINOPHEN 650 MG RE SUPP: 650.0000 mg | Freq: Four times a day (QID) | RECTAL | Status: DC | PRN

## 2013-08-18 MED ORDER — DIAZEPAM 5 MG PO TABS: 5.0000 mg | ORAL_TABLET | Freq: Every day | ORAL | Status: DC | PRN

## 2013-08-18 MED ORDER — SODIUM CHLORIDE 0.9 % IJ SOLN
3.0000 mL | Freq: Two times a day (BID) | INTRAMUSCULAR | Status: DC
  Administered 2013-08-19 – 2013-08-20 (×3): 3 mL via INTRAVENOUS

## 2013-08-18 MED ORDER — SALINE SPRAY 0.65 % NA SOLN
1.0000 | NASAL | Status: DC | PRN
  Filled 2013-08-18: qty 44

## 2013-08-18 MED ORDER — INSULIN ASPART 100 UNIT/ML ~~LOC~~ SOLN
0.0000 [IU] | Freq: Three times a day (TID) | SUBCUTANEOUS | Status: DC
  Administered 2013-08-19: 2 [IU] via SUBCUTANEOUS
  Administered 2013-08-19: 1 [IU] via SUBCUTANEOUS
  Administered 2013-08-20: 2 [IU] via SUBCUTANEOUS
  Administered 2013-08-21: 1 [IU] via SUBCUTANEOUS

## 2013-08-18 MED ORDER — IPRATROPIUM-ALBUTEROL 0.5-2.5 (3) MG/3ML IN SOLN
3.0000 mL | Freq: Four times a day (QID) | RESPIRATORY_TRACT | Status: DC
  Administered 2013-08-18 (×2): 3 mL via RESPIRATORY_TRACT
  Filled 2013-08-18: qty 3

## 2013-08-18 MED ORDER — DEXTROSE 5 % IV SOLN
1.0000 g | INTRAVENOUS | Status: DC
  Administered 2013-08-19 – 2013-08-20 (×2): 1 g via INTRAVENOUS
  Filled 2013-08-18 (×3): qty 10

## 2013-08-18 MED ORDER — GUAIFENESIN-DM 100-10 MG/5ML PO SYRP: 5.0000 mL | ORAL_SOLUTION | ORAL | Status: DC | PRN

## 2013-08-18 MED ORDER — APIXABAN 5 MG PO TABS
5.0000 mg | ORAL_TABLET | Freq: Two times a day (BID) | ORAL | Status: DC
  Administered 2013-08-18 – 2013-08-21 (×7): 5 mg via ORAL
  Filled 2013-08-18 (×8): qty 1

## 2013-08-18 MED ORDER — SODIUM CHLORIDE 0.9 % IV SOLN
1000.0000 mL | Freq: Once | INTRAVENOUS | Status: AC
  Administered 2013-08-18: 1000 mL via INTRAVENOUS

## 2013-08-18 MED ORDER — AMLODIPINE BESYLATE 5 MG PO TABS
5.0000 mg | ORAL_TABLET | Freq: Every day | ORAL | Status: DC
  Administered 2013-08-18 – 2013-08-20 (×3): 5 mg via ORAL
  Filled 2013-08-18 (×5): qty 1

## 2013-08-18 MED ORDER — VITAMIN D3 25 MCG (1000 UNIT) PO TABS
3000.0000 [IU] | ORAL_TABLET | Freq: Every day | ORAL | Status: DC
  Administered 2013-08-18 – 2013-08-21 (×4): 3000 [IU] via ORAL
  Filled 2013-08-18 (×4): qty 3

## 2013-08-18 MED ORDER — ONDANSETRON HCL 4 MG/2ML IJ SOLN
4.0000 mg | Freq: Four times a day (QID) | INTRAMUSCULAR | Status: DC | PRN
  Administered 2013-08-20: 4 mg via INTRAVENOUS
  Filled 2013-08-18: qty 2

## 2013-08-18 MED ORDER — AZITHROMYCIN 500 MG IV SOLR
500.0000 mg | Freq: Once | INTRAVENOUS | Status: AC
  Administered 2013-08-18: 500 mg via INTRAVENOUS

## 2013-08-18 MED ORDER — DEXTROSE 5 % IV SOLN
500.0000 mg | INTRAVENOUS | Status: DC
  Administered 2013-08-19 – 2013-08-20 (×2): 500 mg via INTRAVENOUS
  Filled 2013-08-18 (×3): qty 500

## 2013-08-18 MED ORDER — MOMETASONE FURO-FORMOTEROL FUM 100-5 MCG/ACT IN AERO
2.0000 | INHALATION_SPRAY | Freq: Two times a day (BID) | RESPIRATORY_TRACT | Status: DC
  Administered 2013-08-18 – 2013-08-21 (×6): 2 via RESPIRATORY_TRACT
  Filled 2013-08-18: qty 8.8

## 2013-08-18 MED ORDER — ACETAMINOPHEN 325 MG PO TABS
650.0000 mg | ORAL_TABLET | Freq: Four times a day (QID) | ORAL | Status: DC | PRN
  Administered 2013-08-18: 650 mg via ORAL
  Filled 2013-08-18: qty 2

## 2013-08-18 MED ORDER — NEBIVOLOL HCL 10 MG PO TABS
10.0000 mg | ORAL_TABLET | Freq: Every day | ORAL | Status: DC
  Administered 2013-08-18 – 2013-08-21 (×4): 10 mg via ORAL
  Filled 2013-08-18 (×5): qty 1

## 2013-08-18 MED ORDER — AMLODIPINE-OLMESARTAN 5-40 MG PO TABS: 1.0000 | ORAL_TABLET | Freq: Every day | ORAL | Status: DC

## 2013-08-18 MED ORDER — PREDNISONE 5 MG PO TABS
5.0000 mg | ORAL_TABLET | ORAL | Status: DC
  Administered 2013-08-18 – 2013-08-20 (×2): 5 mg via ORAL
  Filled 2013-08-18 (×2): qty 1

## 2013-08-18 NOTE — Progress Notes (Signed)
ANTIBIOTIC CONSULT NOTE - INITIAL  Pharmacy Consult for azithromycin and ceftriaxone Indication: pneumonia  Allergies  Allergen Reactions  . Codeine Sulfate Other (See Comments)    REACTION: GI upset  . Sulfonamide Derivatives Other (See Comments)    REACTION: GI upset  . Atarax [Hydroxyzine] Nausea Only and Rash    Patient Measurements: Weight: 92.1kg on 08/09/13 Adjusted Body Weight: 59.3kg  Vital Signs: Temp: 101.2 F (38.4 C) (05/14 0803) Temp src: Rectal (05/14 0803) BP: 127/68 mmHg (05/14 0740) Pulse Rate: 92 (05/14 0740)  Microbiology: No results found for this or any previous visit (from the past 720 hour(s)).  Medical History: Past Medical History  Diagnosis Date  . HYPOTHYROIDISM 07/24/2008  . HYPERLIPIDEMIA 07/24/2008  . DEPRESSION 05/16/2009  . HYPERTENSION 07/24/2008  . Vertigo   . Diabetes mellitus type II   . Arthritis     r tkr  . CVA (cerebral vascular accident) 2008    mini stroke.no residual  . Heart murmur     stress test 2009.dr peter Martinique  . Intermittent vertigo   . Skin abnormality     facial lesions .pt applying mupiracin to areas  . Atrial fibrillation   . Breast cancer   . Pneumonia   . Wegener's disease, pulmonary 10/2012    Medications:  Scheduled:  . ipratropium-albuterol  3 mL Nebulization Once   Infusions:  . sodium chloride     Followed by  . sodium chloride    . azithromycin    . cefTRIAXone (ROCEPHIN)  IV     Assessment: 56 yoF admitted 5/14 w 3 days of malaise, anorexia, and productive cough w green sputum. Pt noted to be febrile, hypoxia, and tachycardic in the ED. PMHx significant for pulmonary Wegener's granulomatosis, COPD, CKD4 and is on daily prednisone PTA. Pharmacy has been consulted to dose azithromycin and ceftriaxone for suspected pneumonia.  Antiinfectives 5/14 >> azithromycin >> 5/14 >> ceftriaxone >>    Labs / vitals Tmax: 101.2 WBCs: ordered Renal: ordered (baseline SCr ~1.5)  Microbiology 5/14  blood x2: sent 5/14 urine: ordered  5/14 sputum: ordered   Goal of Therapy:  ceftriaxone and azithromycin per indication  Plan:  - ceftriaxone 1g IV q24h - azithromycin 560m IV q24h - follow up clinical course, culture results - follow-up appropriate change to PO antibiotics - pharmacy will sign-off from formal note writing but will continue to follow peripherally  Thank you for the consult.  JJohny Drilling PharmD, BCPS Pager: [phone]: [phone]/14/2015 9:01 AM

## 2013-08-18 NOTE — H&P (Signed)
History and Physical  [name] [name] OZH:[id number] DOB: 10/12/1932 DOA: 08/18/2013  Referring physician: EDP PCP: [name], MD  Outpatient Specialists:  1. Pulmonology: Dr. Baird Lyons. 2. Cardiology: Dr. Peter Martinique. 3. Medical Oncology: Dr. Lurline Del.  Chief Complaint: Feeling unwell, poor appetite, nausea and left hand pain  HPI: [name] [name] is a 80 y.o. female with extensive PMH-DM 2, HTN, CVA, A. 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 She states that she was in her usual state of health until 3 days ago when she started experiencing poor appetite and has not been able to eat anything, nausea but no vomiting, abdominal pain, constipation or diarrhea. She actually states that she does not have dyspnea and that her breathing is at baseline. She however complains of cough productive of yellow/green sputum without associated chest pain, wheezing or chest tightness. She denies sore throat, headache or earache. No dysuria or urinary frequency reported. States that her weight is stable. She also started noticing left hand pain which is intermittent but got worse last night. No history of trauma and cannot recollect if she slept in an abnormal position. Worse with movements and better with keeping it still. No associated tingling, numbness or weakness. Denies any other strokelike symptoms. In the ED, noted to be febrile 101.32F, tachypneic, hypoxic at 87% on room air, sodium 126, chloride 87, BUN 24, creatinine 1.61, WBC 10.8, hemoglobin 8.3 pro BNP 14 417 and chest x-ray reported  as asymmetric airspace disease superimposed on chronic lung changes. Hospitalist admission requested.   Review of Systems: All systems reviewed and apart from history of presenting illness, are negative.  Past Medical History  Diagnosis Date  . HYPOTHYROIDISM 07/24/2008  . HYPERLIPIDEMIA 07/24/2008  . DEPRESSION 05/16/2009  . HYPERTENSION 07/24/2008  . Vertigo   . Diabetes mellitus type II   . Arthritis     r tkr  . CVA (cerebral vascular accident) 2008    mini stroke.no residual  . Heart murmur     stress test 2009.dr peter Martinique  . Intermittent vertigo   . Skin abnormality     facial lesions .pt applying mupiracin to areas  . Atrial fibrillation   . Breast cancer   . Pneumonia   . 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 Knee surgery  2009    TKR  . Cholecystectomy  1980  . Joint replacement      r knee  . Breast surgery  2007    Lumpectomy, XRT 2006.l breast  . Video assisted thoracoscopy  04/22/2011    Procedure: VIDEO ASSISTED THORACOSCOPY;  Surgeon: Melrose Nakayama, MD;  Location: [place];  Service: Thoracic;  Laterality: Left;  WITH BIOPSY  . Heel spur surgery Right   . Exploratory laparotomy     Social History:  reports that she quit smoking about 31 years ago. Her smoking use included Cigarettes. She has a 6 pack-year smoking history. She has never used smokeless tobacco. She reports that she does not drink alcohol or use illicit drugs. Married. Lives with spouse. Ambulates with the help of a walker. Not on home oxygen.  Allergies  Allergen Reactions  . Codeine Sulfate Other (See Comments)    REACTION: GI upset  . Sulfonamide Derivatives Other (See Comments)    REACTION: GI upset  . Atarax [Hydroxyzine] Nausea Only and Rash    Family History  Problem Relation Age of Onset  . Arthritis Mother   . Rheum arthritis Mother   . Heart disease Father   . Coronary artery disease Father   . Cancer Sister      breast CA, both sisters  . Breast cancer Sister   . Coronary artery disease Brother   . Lung cancer Brother     Prior to Admission medications   Medication Sig Start Date End Date Taking? Authorizing Provider  acetaminophen (TYLENOL) 500 MG tablet Take 500 mg by mouth every 6 (six) hours as needed.   Yes Historical Provider, MD  albuterol (PROVENTIL) (2.5 MG/3ML) 0.083% nebulizer solution Take 2.5 mg by nebulization every 6 (six) hours as needed for wheezing or shortness of breath.   Yes Historical Provider, MD  amLODipine-olmesartan (AZOR) 5-40 MG per tablet Take 1 tablet by mouth at bedtime.    Yes Historical Provider, MD  apixaban (ELIQUIS) 5 MG TABS tablet Take 5 mg by mouth 2 (two) times daily.   Yes Historical Provider, MD  CALCIUM CITRATE PO Take 1,200 mg by mouth daily.     Yes Historical Provider, MD  cholecalciferol (VITAMIN D) 1000 UNITS tablet Take 3,000 Units by mouth daily.     Yes Historical Provider, MD  diazepam (VALIUM) 5 MG tablet Take 5 mg by mouth daily as needed for anxiety.   Yes Historical Provider, MD  furosemide (LASIX) 20 MG tablet Take 1 tablet (20 mg total) by mouth daily. 04/28/13  Yes [name], MD  gabapentin (NEURONTIN) 100 MG capsule Take 1 capsule (100 mg total) by mouth 2 (two) times daily before a meal. 11/08/12  Yes Erick Colace, NP  glimepiride (AMARYL) 2 MG tablet Take 1 tablet (2 mg total) by mouth daily before breakfast. 07/26/13  Yes [name], MD  insulin glargine (LANTUS) 100 UNIT/ML injection Inject 10 Units into the skin at bedtime.   Yes Historical Provider, MD  levothyroxine (SYNTHROID, LEVOTHROID) 125 MCG tablet Take 125 mcg by mouth daily before breakfast.   Yes Historical Provider, MD  loratadine (CLARITIN) 10 MG tablet Take 10 mg by mouth daily as needed for allergies.   Yes Historical Provider, MD  mometasone-formoterol (DULERA) 100-5 MCG/ACT AERO Inhale 2 puffs into the lungs 2 (two) times daily. Inhale 2 puffs then rinse  mouth 01/17/13  Yes Deneise Lever, MD  nebivolol (BYSTOLIC) 10 MG tablet Take 10 mg by mouth daily after breakfast.    Yes Historical Provider, MD  predniSONE (DELTASONE) 10 MG tablet Take 5-10 mg by mouth daily with breakfast. Takes 40m every other day, then 2.567mthe other days   Yes Historical Provider, MD  pyridOXINE (VITAMIN B-6) 100 MG tablet Take 100 mg by mouth every morning.   Yes Historical Provider, MD  rosuvastatin (CRESTOR) 5 MG tablet Take 1 tablet (5 mg total) by mouth daily. 04/20/12  Yes Peter M JoMartiniqueMD  sodium chloride (OCEAN) 0.65 % SOLN nasal spray Place 1 spray into both nostrils as needed for congestion. 05/10/13  Yes MaJanece CanterburyMD  HYDROcodone-homatropine ([facility].5 MG/5ML syrup Take 5 mLs by mouth every 6 (six) hours as needed for cough. 08/11/13   BrEulas PostMD   Physical Exam: FiDanley Dankeritals:  08/18/13 0803 08/18/13 0816 08/18/13 1038 08/18/13 1223  BP:   102/57 125/68  Pulse:   85 61  Temp: 101.2 F (38.4 C)  99.4 F (37.4 C) 97.9 F (36.6 C)  TempSrc: Rectal  Oral Oral  Resp:   29 15  SpO2:  96% 97% 95%     General exam: Moderately built and nourished pleasant elderly patient, lying comfortably propped up on the gurney and very mildly tachypneic but appears comfortable.  Head, eyes and ENT: Nontraumatic and normocephalic. Pupils equally reacting to light and accommodation. Oral mucosa with borderline hydration.  Neck: Supple. No JVD, carotid bruit or thyromegaly.  Lymphatics: No lymphadenopathy.  Respiratory system: Harsh and reduced breath sounds bilaterally especially in the bases with bibasal crackles right greater than left but lot of these crackles sound Velcro-like/chronic appearing. Occasional bilateral wheezing. Minimal tachypnea but able to speak in full sentences without difficulty.  Cardiovascular system: S1 and S2 heard, irregularly irregular. No JVD, murmurs, gallops, clicks or pedal edema.  Gastrointestinal system: Abdomen  is nondistended, soft and nontender. Normal bowel sounds heard. No organomegaly or masses appreciated.  Central nervous system: Alert and oriented. No focal neurological deficits.  Extremities: Symmetric 5 x 5 power. Peripheral pulses symmetrically felt. Left hand and fingers below the wrist with reproducible tenderness and painful range of movements without any other acute findings. Color normal.  Skin: No rashes or acute findings.  Musculoskeletal system: Negative exam.  Psychiatry: Pleasant and cooperative.   Labs on Admission:  Basic Metabolic Panel:  Recent Labs Lab 08/18/13 0913  NA 126*  K 4.2  CL 87*  CO2 23  GLUCOSE 90  BUN 24*  CREATININE 1.61*  CALCIUM 9.3   Liver Function Tests:  Recent Labs Lab 08/18/13 0913  AST 19  ALT 10  ALKPHOS 72  BILITOT 0.6  PROT 7.1  ALBUMIN 3.4*   No results found for this basename: LIPASE, AMYLASE,  in the last 168 hours No results found for this basename: AMMONIA,  in the last 168 hours CBC:  Recent Labs Lab 08/18/13 0913  WBC 10.8*  NEUTROABS 7.8*  HGB 8.3*  HCT 24.6*  MCV 85.7  PLT 310   Cardiac Enzymes: No results found for this basename: CKTOTAL, CKMB, CKMBINDEX, TROPONINI,  in the last 168 hours  BNP (last 3 results)  Recent Labs  04/13/13 1645 05/07/13 1525 08/18/13 0913  PROBNP 13238.0* 6589.0* 14417.0*   CBG: No results found for this basename: GLUCAP,  in the last 168 hours  Radiological Exams on Admission: Dg Chest Port 1 View  08/18/2013   CLINICAL DATA:  Cough, hypoxia and fever. History of high blood pressure and breast cancer.  EXAM: PORTABLE CHEST - 1 VIEW  COMPARISON:  07/19/2013 and 04/13/2013.  FINDINGS: Asymmetric airspace disease superimposed upon chronic lung changes may represent mild pulmonary edema. No segmental consolidation. Difficult to exclude interstitial infectious process given the patient's history of fever and chronic lung changes.  No obvious pulmonary metastatic disease.   Cardiomegaly.  Calcified tortuous aorta.  IMPRESSION: Asymmetric airspace disease superimposed upon chronic lung changes may represent mild pulmonary edema. Please see above.  Cardiomegaly.  Calcified mildly tortuous aorta.   Electronically Signed   By: Chauncey Cruel M.D.   On: 08/18/2013 08:34    EKG: Independently reviewed. Baseline artifact. Atrial fibrillation with ventricular rate 102 beats per minute. No obvious acute findings.   Assessment/Plan Principal Problem:   CAP (community acquired pneumonia) Active Problems:   HYPOTHYROIDISM   HYPERLIPIDEMIA  HYPONATREMIA   HYPERTENSION   Type 2 diabetes mellitus   Atrial fibrillation   Anemia   Acute respiratory failure   Chronic kidney disease (CKD), stage IV (severe)   Diastolic CHF, chronic   COPD (chronic obstructive pulmonary disease)   Sepsis   Hand pain, left   1. Community acquired pneumonia: Admit to telemetry. Followup on blood, urine and sputum culture results. Check urine Legionella and streptococcal antigen. Patient received first dose of IV Rocephin and azithromycin in the ED-continue same. 2. Sepsis: Secondary to pneumonia. Management as indicated above. Hemodynamically stable. 3. Acute hypoxic respiratory failure: Secondary to pneumonia complicating underlying COPD, pulmonary Wegener's disease, chronic diastolic CHF. Treat underlying pneumonia, oxygen supplementation, bronchodilator nebulizations and continue chronic prednisone. 4. Hyponatremia: Appears to be chronic.? SIADH. Asymptomatic. Patient is status post 1 L of IV normal saline bolus. DC IV fluids due to history of CHF and does not look overly dehydrated. Followup BMP in a couple of hours and daily. Continue home dose of Lasix. 5. Left hand pain:? Secondary to osteoarthritis. Clinically not suspicious for vascular or neurological etiology. No reported history of gout. Pain management and monitor. 6. Hypertension: Controlled. Continue home medications 7. Type II  DM with nephropathy: Continue Lantus. Hold oral medications and place on SSI. 8. Anemia: Possibly secondary to chronic kidney disease. Seems stable. Follow CBC. 9. Stage IV chronic kidney disease: Creatinine appears at baseline. Follow BMP. 10. Atrial fibrillation: With controlled ventricular rate. Continue beta blockers and AC 11. History of hypothyroid: Continue Synthroid 12. Chronic diastolic CHF: Compensated. Continue Lasix 13. History of COPD/pulmonary Wegener's disease: Management as above      Code Status:  full   Family Communication:  discussed with spouse extensively at bedside.   Disposition Plan: home when medically stable, possibly in 2-3 days.  Time spent:  65 minutes   Modena Jansky, MD, FACP, [facility]. [facility] Pager [phone]  If 7PM-7AM, please contact night-coverage [email or web address] Password [email or web address] 08/18/2013, 12:28 PM

## 2013-08-18 NOTE — ED Notes (Signed)
Report was given to RN in ICU. The admitting MD is here in the room with the pt and her husband ad has decided to change the location of where pt will be admitted. Will go to tele.

## 2013-08-18 NOTE — ED Notes (Signed)
Pt called EMS c/o generally not feeling well over th last 3 days.  Also c/o hand pain greater in her left than the right.Per EMS pt has wheezing in her right lobes and rhonchi. 02 sats in the low 90's placed on 2L/[place], was given breathing treatment at home (pt already takes this). %mg Albuterol. VSS over EMS 134/74 hr 76, CBG 119, tympanic of 99.3.  Allergies to sulfa and hydroxyzine. NO IV or BP in left arm.   22g in RAC placed by EMS.

## 2013-08-18 NOTE — Progress Notes (Signed)
Ur completed

## 2013-08-18 NOTE — ED Notes (Signed)
Pt O2 at 87% Room Air, put on 2 L/min, resolved to 95%, RN notified

## 2013-08-18 NOTE — Care Management Note (Signed)
Page 1 of 1   08/18/2013     3:44:28 PM CARE MANAGEMENT NOTE 08/18/2013  Patient:  [name] [name], [name] [name]   Account Number:  [id number]  Date Initiated:  08/18/2013  Documentation initiated by:  Dessa Phi  Subjective/Objective Assessment:   80 Y/O F ADMITTED W/PNA.HX:CVA,COPD.     Action/Plan:   FROM HOME W/SPOUSE.HAS PCP,PHARMACY.   Anticipated DC Date:  08/22/2013   Anticipated DC Plan:  [place]  CM consult      Choice offered to / List presented to:             Status of service:  In process, will continue to follow Medicare Important Message given?   (If response is "NO", the following Medicare IM given date fields will be blank) Date Medicare IM given:   Date Additional Medicare IM given:    Discharge Disposition:    Per UR Regulation:  Reviewed for med. necessity/level of care/duration of stay  If discussed at [place] of Stay Meetings, dates discussed:    Comments:  08/18/13 [facility] RN,BSN NCM [phone]

## 2013-08-18 NOTE — ED Notes (Signed)
Report given to 4th floor RN. Will tx to unit

## 2013-08-18 NOTE — ED Provider Notes (Signed)
CSN: [id number]     Arrival date & time 08/18/13  0740 History   First MD Initiated Contact with Patient 08/18/13 [phone]     Chief Complaint  Patient presents with  . Hand Pain  . Cough     (Consider location/radiation/quality/duration/timing/severity/associated sxs/prior Treatment) Patient is a 80 y.o. female presenting with cough. The history is provided by the patient and the spouse. No language interpreter was used.  Cough Cough characteristics:  Productive Sputum characteristics:  Green Severity:  Moderate Onset quality:  Gradual Duration:  3 days Timing:  Constant Progression:  Worsening Chronicity:  New Smoker: no   Context: not sick contacts   Relieved by:  Nothing Worsened by:  Nothing tried Ineffective treatments:  Beta-agonist inhaler Associated symptoms: shortness of breath and wheezing   Associated symptoms: no chest pain, no chills, no diaphoresis, no eye discharge, no fever, no headaches, no rhinorrhea, no sinus congestion and no sore throat   Associated symptoms comment:  Malaise, anorexia Shortness of breath:    Severity:  Moderate   Onset quality:  Gradual   Duration:  3 days   Timing:  Constant   Progression:  Worsening Risk factors: no recent infection and no recent travel     Past Medical History  Diagnosis Date  . HYPOTHYROIDISM 07/24/2008  . HYPERLIPIDEMIA 07/24/2008  . DEPRESSION 05/16/2009  . HYPERTENSION 07/24/2008  . Vertigo   . Diabetes mellitus type II   . Arthritis     r tkr  . CVA (cerebral vascular accident) 2008    mini stroke.no residual  . Heart murmur     stress test 2009.dr peter Martinique  . Intermittent vertigo   . Skin abnormality     facial lesions .pt applying mupiracin to areas  . Atrial fibrillation   . Breast cancer   . Pneumonia   . 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 Knee surgery  2009    TKR  . Cholecystectomy  1980  . Joint replacement      r knee  . Breast  surgery  2007    Lumpectomy, XRT 2006.l breast  . Video assisted thoracoscopy  04/22/2011    Procedure: VIDEO ASSISTED THORACOSCOPY;  Surgeon: Melrose Nakayama, MD;  Location: [place];  Service: Thoracic;  Laterality: Left;  WITH BIOPSY  . Heel spur surgery Right   . Exploratory laparotomy     Family History  Problem Relation Age of Onset  . Arthritis Mother   . Rheum arthritis Mother   . Heart disease Father   . Coronary artery disease Father   . Cancer Sister     breast CA, both sisters  . Breast cancer Sister   . Coronary artery disease Brother   . Lung cancer Brother    History  Substance Use Topics  . Smoking status: Former Smoker -- 0.50 packs/day for 12 years    Types: Cigarettes    Quit date: 06/04/1982  . Smokeless tobacco: Never Used  . Alcohol Use: No   OB History   Grav Para Term Preterm Abortions TAB SAB Ect Mult Living                 Review of Systems  Constitutional: Positive for appetite change and fatigue. Negative for fever, chills, diaphoresis and activity change.  HENT: Negative for congestion, facial swelling, rhinorrhea and sore throat.   Eyes: Negative for photophobia and discharge.  Respiratory: Positive for cough, shortness of breath  and wheezing. Negative for chest tightness.   Cardiovascular: Negative for chest pain, palpitations and leg swelling.  Gastrointestinal: Negative for nausea, vomiting, abdominal pain and diarrhea.  Endocrine: Negative for polydipsia and polyuria.  Genitourinary: Negative for dysuria, frequency, difficulty urinating and pelvic pain.  Musculoskeletal: Negative for arthralgias, back pain, neck pain and neck stiffness.  Skin: Negative for color change and wound.  Allergic/Immunologic: Negative for immunocompromised state.  Neurological: Negative for facial asymmetry, weakness, numbness and headaches.  Hematological: Does not bruise/bleed easily.  Psychiatric/Behavioral: Negative for confusion and agitation.       Allergies  Codeine sulfate; Sulfonamide derivatives; and Atarax  Home Medications   Prior to Admission medications   Medication Sig Start Date End Date Taking? Authorizing Provider  albuterol (PROVENTIL) (2.5 MG/3ML) 0.083% nebulizer solution USE 3 MLS (1 VIAL) IN NEBULIZER EVERY 6 HOURS AS NEEDED FOR SHORTNESS OF BREATH OR WHEEZING 06/16/13   [name], MD  amLODipine-olmesartan (AZOR) 5-40 MG per tablet Take 1 tablet by mouth daily.    Historical Provider, MD  CALCIUM CITRATE PO Take 1,200 mg by mouth daily.      Historical Provider, MD  cholecalciferol (VITAMIN D) 1000 UNITS tablet Take 3,000 Units by mouth daily.      Historical Provider, MD  diazepam (VALIUM) 5 MG tablet TAKE ONE TABLET BY MOUTH ONCE DAILY AS NEEDED 05/23/13   [name], MD  ELIQUIS 5 MG TABS tablet TAKE ONE TABLET BY MOUTH TWICE DAILY 05/23/13   Peter M Martinique, MD  furosemide (LASIX) 20 MG tablet Take 1 tablet (20 mg total) by mouth daily. 04/28/13   [name], MD  gabapentin (NEURONTIN) 100 MG capsule Take 1 capsule (100 mg total) by mouth 2 (two) times daily before a meal. 11/08/12   Erick Colace, NP  glimepiride (AMARYL) 2 MG tablet Take 1 tablet (2 mg total) by mouth daily before breakfast. 07/26/13   [name], MD  HYDROcodone-homatropine (HYCODAN) 5-1.5 MG/5ML syrup Take 5 mLs by mouth every 6 (six) hours as needed for cough. 08/11/13   [name], MD  Insulin Detemir (LEVEMIR FLEXTOUCH) 100 UNIT/ML Pen Inject 10 Units into the skin at bedtime.    Historical Provider, MD  Insulin Pen Needle 31G X 6 MM MISC Relion Pen Needles 06/16/13   [name], MD  levothyroxine (SYNTHROID, LEVOTHROID) 125 MCG tablet Take 125 mcg by mouth daily before breakfast.    Historical Provider, MD  mometasone-formoterol (DULERA) 100-5 MCG/ACT AERO 2 puffs then rinse mouth, twice daily 01/17/13   Deneise Lever, MD  nebivolol (BYSTOLIC) 10 MG tablet Take 10 mg by mouth daily.    Historical  Provider, MD  predniSONE (DELTASONE) 20 MG tablet Take 3 tabs daily for 3 days, then 2 tabs daily for 3 days, then 1 tab daily for 3 days, then resume half tab daily 05/10/13   Janece Canterbury, MD  pyridOXINE (VITAMIN B-6) 100 MG tablet Take 100 mg by mouth every morning.    Historical Provider, MD  rosuvastatin (CRESTOR) 5 MG tablet Take 1 tablet (5 mg total) by mouth daily. 04/20/12   Peter M Martinique, MD  sodium chloride (OCEAN) 0.65 % SOLN nasal spray Place 1 spray into both nostrils as needed for congestion. 05/10/13   Janece Canterbury, MD   BP 102/57  Pulse 85  Temp(Src) 99.4 F (37.4 C) (Oral)  Resp 29  SpO2 97% Physical Exam  Constitutional: She is oriented to person, place, and time. She  appears well-developed and well-nourished. She appears distressed.  HENT:  Head: Normocephalic and atraumatic.  Mouth/Throat: No oropharyngeal exudate.  Eyes: Pupils are equal, round, and reactive to light.  Neck: Normal range of motion. Neck supple.  Cardiovascular: Normal rate and normal heart sounds.  An irregularly irregular rhythm present. Exam reveals no gallop and no friction rub.   No murmur heard. Pulmonary/Chest: Accessory muscle usage present. Tachypnea noted. No respiratory distress. She has wheezes in the right upper field, the right middle field, the left upper field and the left middle field. She has rales in the right middle field and the right lower field.  Abdominal: Soft. Bowel sounds are normal. She exhibits no distension and no mass. There is no tenderness. There is no rebound and no guarding.  Musculoskeletal: Normal range of motion. She exhibits no edema and no tenderness.  Neurological: She is alert and oriented to person, place, and time.  Skin: Skin is warm and dry.  Psychiatric: She has a normal mood and affect.    ED Course  Procedures (including critical care time) Labs Review Labs Reviewed  CBC WITH DIFFERENTIAL - Abnormal; Notable for the following:    WBC 10.8 (*)     RBC 2.87 (*)    Hemoglobin 8.3 (*)    HCT 24.6 (*)    Neutro Abs 7.8 (*)    Monocytes Relative 14 (*)    Monocytes Absolute 1.5 (*)    All other components within normal limits  COMPREHENSIVE METABOLIC PANEL - Abnormal; Notable for the following:    Sodium 126 (*)    Chloride 87 (*)    BUN 24 (*)    Creatinine, Ser 1.61 (*)    Albumin 3.4 (*)    GFR calc non Af Amer 30 (*)    GFR calc Af Amer 34 (*)    All other components within normal limits  BLOOD GAS, ARTERIAL - Abnormal; Notable for the following:    pH, Arterial 7.457 (*)    pCO2 arterial 33.5 (*)    All other components within normal limits  PRO B NATRIURETIC PEPTIDE - Abnormal; Notable for the following:    Pro B Natriuretic peptide (BNP) 14417.0 (*)    All other components within normal limits  CULTURE, BLOOD (ROUTINE X 2)  CULTURE, BLOOD (ROUTINE X 2)  URINE CULTURE  CULTURE, EXPECTORATED SPUTUM-ASSESSMENT  URINALYSIS, ROUTINE W REFLEX MICROSCOPIC  PROTIME-INR  I-STAT CG4 LACTIC ACID, ED  Randolm Idol, ED    Imaging Review Dg Chest Port 1 View  08/18/2013   CLINICAL DATA:  Cough, hypoxia and fever. History of high blood pressure and breast cancer.  EXAM: PORTABLE CHEST - 1 VIEW  COMPARISON:  07/19/2013 and 04/13/2013.  FINDINGS: Asymmetric airspace disease superimposed upon chronic lung changes may represent mild pulmonary edema. No segmental consolidation. Difficult to exclude interstitial infectious process given the patient's history of fever and chronic lung changes.  No obvious pulmonary metastatic disease.  Cardiomegaly.  Calcified tortuous aorta.  IMPRESSION: Asymmetric airspace disease superimposed upon chronic lung changes may represent mild pulmonary edema. Please see above.  Cardiomegaly.  Calcified mildly tortuous aorta.   Electronically Signed   By: Chauncey Cruel M.D.   On: 08/18/2013 08:34     EKG Interpretation   Date/Time:  Thursday Aug 18 2013 07:51:50 EDT Ventricular Rate:  102 PR Interval:     QRS Duration: 113 QT Interval:  370 QTC Calculation: 482 R Axis:   20 Text Interpretation:  Atrial fibrillation Borderline intraventricular  conduction delay Low voltage, precordial leads Repol abnrm suggests  ischemia, inferior leads Confirmed by Yarelly Kuba  MD, Jawuan Robb [phone]) on  08/18/2013 8:15:01 AM      MDM   Final diagnoses:  CAP (community acquired pneumonia)    Pt is a 80 y.o. female with Pmhx as above who presents with about 3 days of malaise, anorexia, productive cough w/ green sputum.  On PE, Pt febrile, tachypneic, HR in afib 90-100. She has accessory respiratory muscle use, but able to speak in full sentences. She has scattereded wheezing throughout with rales at right middle and lower lobes. Minimal LE edema. Level II code sepsis initiated and rocephin/azithro will be started for presumed CAP (last hospitalization 1/'15).  CXR shows asymmetric airspace disease superimposed upon chronic lung changes.  Given her clinical picture, i suspect interstitial pna.Trop not elevated. BNP 14K. WBC mildly elevated, Na chronically low. Cr at baseline.  After 1L NS bolus, and duoneb, pt feels only mildly improved, remains tachypneic.  [facility] consulted, will admit to stepdown.         Neta Ehlers, MD 08/18/13 1539

## 2013-08-19 DIAGNOSIS — M79609 Pain in unspecified limb: Secondary | ICD-10-CM

## 2013-08-19 LAB — BASIC METABOLIC PANEL
BUN: 21 mg/dL (ref 6–23)
CO2: 26 meq/L (ref 19–32)
CREATININE: 1.54 mg/dL — AB (ref 0.50–1.10)
Calcium: 8.7 mg/dL (ref 8.4–10.5)
Chloride: 89 mEq/L — ABNORMAL LOW (ref 96–112)
GFR calc non Af Amer: 31 mL/min — ABNORMAL LOW (ref >90)
GFR, EST AFRICAN AMERICAN: 36 mL/min — AB (ref >90)
Glucose, Bld: 141 mg/dL — ABNORMAL HIGH (ref 70–99)
Potassium: 4.3 mEq/L (ref 3.7–5.3)
Sodium: 126 mEq/L — ABNORMAL LOW (ref 137–147)

## 2013-08-19 LAB — LEGIONELLA ANTIGEN, URINE: Legionella Antigen, Urine: NEGATIVE

## 2013-08-19 LAB — EXPECTORATED SPUTUM ASSESSMENT W GRAM STAIN, RFLX TO RESP C

## 2013-08-19 LAB — EXPECTORATED SPUTUM ASSESSMENT W REFEX TO RESP CULTURE

## 2013-08-19 LAB — CBC
HCT: 23.7 % — ABNORMAL LOW (ref 36.0–46.0)
Hemoglobin: 7.8 g/dL — ABNORMAL LOW (ref 12.0–15.0)
MCH: 28.6 pg (ref 26.0–34.0)
MCHC: 32.9 g/dL (ref 30.0–36.0)
MCV: 86.8 fL (ref 78.0–100.0)
Platelets: 253 10*3/uL (ref 150–400)
RBC: 2.73 MIL/uL — ABNORMAL LOW (ref 3.87–5.11)
RDW: 14 % (ref 11.5–15.5)
WBC: 9.7 10*3/uL (ref 4.0–10.5)

## 2013-08-19 LAB — GLUCOSE, CAPILLARY
GLUCOSE-CAPILLARY: 141 mg/dL — AB (ref 70–99)
GLUCOSE-CAPILLARY: 175 mg/dL — AB (ref 70–99)
Glucose-Capillary: 111 mg/dL — ABNORMAL HIGH (ref 70–99)
Glucose-Capillary: 172 mg/dL — ABNORMAL HIGH (ref 70–99)

## 2013-08-19 LAB — URINE CULTURE: Colony Count: 25000

## 2013-08-19 LAB — STREP PNEUMONIAE URINARY ANTIGEN: Strep Pneumo Urinary Antigen: NEGATIVE

## 2013-08-19 MED ORDER — IPRATROPIUM-ALBUTEROL 0.5-2.5 (3) MG/3ML IN SOLN
3.0000 mL | Freq: Four times a day (QID) | RESPIRATORY_TRACT | Status: DC
  Administered 2013-08-19 – 2013-08-20 (×8): 3 mL via RESPIRATORY_TRACT
  Filled 2013-08-19 (×8): qty 3

## 2013-08-19 NOTE — Evaluation (Signed)
Physical Therapy Evaluation Patient Details Name: [name] [name] MRN: [id number] DOB: 06/29/1932 Today's Date: 08/19/2013   History of Present Illness   80 y.o. female adm with CAP, sepsis; PMH-DM 2, HTN, CVA, A. fib on Apixaban, pulmonary Wegener's,  COPD, hypothyroid, HLD, anxiety & depression, arthritis, breast cancer in remission, hyponatremia, lung Ca  Clinical Impression  Pt will benefit for address deficits below; Plan is for HHPT, pt husband can assist with meals, etc    Follow Up Recommendations Home health PT    Equipment Recommendations  None recommended by PT    Recommendations for Other Services       Precautions / Restrictions Precautions Precautions: Fall      Mobility  Bed Mobility Overal bed mobility: Needs Assistance Bed Mobility: Supine to Sit     Supine to sit: Supervision     General bed mobility comments: incr time, HOB elevated  Transfers Overall transfer level: Needs assistance Equipment used: Rolling walker (2 wheeled) Transfers: Sit to/from Stand Sit to Stand: Min assist         General transfer comment: cues for hand placement  Ambulation/Gait Ambulation/Gait assistance: Min assist Ambulation Distance (Feet): 50 Feet Assistive device: Rolling walker (2 wheeled) Gait Pattern/deviations: Step-through pattern     General Gait Details: cues for breathing, RW position; fatigues quickly  Stairs            Wheelchair Mobility    Modified Rankin (Stroke Patients Only)       Balance Overall balance assessment: Needs assistance Sitting-balance support: No upper extremity supported Sitting balance-Leahy Scale: Good       Standing balance-Leahy Scale: Fair                               Pertinent Vitals/Pain sats 88% on RA HR 66    Home Living Family/patient expects to be discharged to:: Private residence Living Arrangements: Spouse/significant other Available Help at Discharge: Family Type of Home:  House Home Access: Stairs to enter   Technical brewer of Steps: 1 Home Layout: One level Home Equipment: Environmental consultant - 2 wheels;Cane - single point;Grab bars - toilet;Grab bars - tub/shower;Shower seat      Prior Function Level of Independence: Independent with assistive device(s)         Comments: pt reports RW in household and w/c for community     Hand Dominance        Extremity/Trunk Assessment   Upper Extremity Assessment: Defer to OT evaluation           Lower Extremity Assessment: Generalized weakness         Communication   Communication: No difficulties  Cognition Arousal/Alertness: Awake/alert Behavior During Therapy: WFL for tasks assessed/performed Overall Cognitive Status: Within Functional Limits for tasks assessed                      General Comments      Exercises        Assessment/Plan    PT Assessment Patient needs continued PT services  PT Diagnosis Difficulty walking   PT Problem List Decreased strength;Decreased activity tolerance;Decreased mobility;Decreased balance  PT Treatment Interventions DME instruction;Gait training;Functional mobility training;Therapeutic activities;Therapeutic exercise;Patient/family education   PT Goals (Current goals can be found in the Care Plan section) Acute Rehab PT Goals Patient Stated Goal: get stronger PT Goal Formulation: With patient Time For Goal Achievement: 08/26/13 Potential to Achieve Goals: Good  Frequency Min 3X/week   Barriers to discharge        Co-evaluation               End of Session Equipment Utilized During Treatment: Gait belt Activity Tolerance: Patient limited by fatigue Patient left: in chair;with call bell/phone within reach;with nursing/sitter in room           Time: 1009-1023 PT Time Calculation (min): 14 min   Charges:   PT Evaluation $Initial PT Evaluation Tier I: 1 Procedure PT Treatments $Gait Training: 8-22 mins   PT G Codes:           Neil Crouch 08/19/2013, 10:44 AM

## 2013-08-19 NOTE — Progress Notes (Signed)
PROGRESS NOTE    [name] [name] WUX:[id number] DOB: [birth year] DOA: 08/18/2013 PCP: [name], MD Outpatient Specialists:   Pulmonology: Dr. Baird Lyons.  Cardiology: Dr. Peter Martinique.  Medical Oncology: Dr. Lurline Del.   HPI/Brief narrative 80 y.o. female with extensive PMH-DM 2, HTN, CVA, A. 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 She complained of productive cough. In the ED, noted to be febrile 101.11F, tachypneic, hypoxic at 87% on room air, sodium 126, chloride 87, BUN 24, creatinine 1.61, WBC 10.8, hemoglobin 8.3 pro BNP 14 417 and chest x-ray reported as asymmetric airspace disease superimposed on chronic lung changes.     Assessment/Plan:  1. Community acquired pneumonia: Blood cultures x2: Negative to date. Urine culture suggests contamination. Urinary Legionella and streptococcal antigen negative. Patient being empirically treated with IV Rocephin and azithromycin. Improving. Possible DC on 5/16 on oral levofloxacin.  2. Sepsis: Secondary to pneumonia. Management as indicated above. Hemodynamically stable. Sepsis resolved. 3. Acute hypoxic respiratory failure: Secondary to pneumonia complicating underlying COPD, pulmonary Wegener's disease, chronic diastolic CHF. Treat underlying pneumonia, oxygen supplementation, bronchodilator nebulizations and continue chronic prednisone. Improved. Titrate off oxygen as tolerated. 4. Hyponatremia: Appears to be chronic.? SIADH. Asymptomatic. Patient is status post 1 L of IV normal saline bolus. DC IV fluids due to history of  CHF and does not look overly dehydrated. Sodium stable. Outpatient followup.  5. Left hand pain:? Secondary to osteoarthritis. Clinically not suspicious for vascular or neurological etiology. No reported history of gout. Patient has no further joint or hand pain. She has a small area of faint redness over the dorsum of left hand without other acute findings. Unclear etiology. Denies insect bites. Monitor 6. Hypertension: Controlled. Continue home medications 7. Type II DM with nephropathy: Continue Lantus. Hold oral medications and place on SSI. 8. Anemia: Possibly secondary to chronic kidney disease. Seems stable. Transfuse for hemoglobin less than 7 g per DL. 9. Stage IV chronic kidney disease: Creatinine appears at baseline. Follow BMP. 10. Atrial fibrillation: With controlled ventricular rate. Continue beta blockers and AC 11. History of hypothyroid: Continue Synthroid 12. Chronic diastolic CHF: Compensated. Continue Lasix 13. History of COPD/pulmonary Wegener's disease: Management as above     Code Status: Full Family Communication: Discussed with spouse at bedside. Disposition Plan: Possible DC home on 5/16   Consultants:  None  Procedures:  None  Antibiotics:  IV Rocephin 5/15 >  IV azithromycin 5/15 >   Subjective: Feels much better. Denies dyspnea. Cough has improved. No pain in the left hand. Minimal redness over dorsum of left hand.  Objective: Filed Vitals:   08/19/13 0854 08/19/13 1159 08/19/13 1358 08/19/13 1617  BP:   99/51   Pulse:   85   Temp:   98.7 F (37.1 C)   TempSrc:   Oral   Resp:   34   Height:      Weight:      SpO2: 93% 94% 100% 96%    Intake/Output Summary (Last 24 hours) at 08/19/13 1640 Last data filed at 08/19/13 1300  Gross per 24 hour  Intake    660 ml  Output   3250 ml  Net  -2590  ml   Filed Weights   08/18/13 1351 08/19/13 0500  Weight: 91.7 kg (202 lb 2.6 oz) 91.581 kg (201 lb 14.4 oz)     Exam:  General exam:  Pleasant elderly female sitting up comfortably on the chair. Respiratory system: Reduced breath sounds in the bases with chronic sounding Velcro-like basal crackles. Rest of lung fields with slightly harsh breath sounds. No increased work of breathing. Cardiovascular system: S1 & S2 heard, RRR. No JVD, murmurs, gallops, clicks or pedal edema. Telemetry: Atrial fibrillation with controlled ventricular rate. Gastrointestinal system: Abdomen is nondistended, soft and nontender. Normal bowel sounds heard. Central nervous system: Alert and oriented. No focal neurological deficits. Extremities: Symmetric 5 x 5 power. Faint mild redness over dorsum of left hand but no tenderness or other acute findings.   Data Reviewed: Basic Metabolic Panel:  Recent Labs Lab 08/18/13 0913 08/18/13 1704 08/19/13 0352  NA 126* 125* 126*  K 4.2 4.2 4.3  CL 87* 88* 89*  CO2 _0 GLUCOSE 90 112* 141*  BUN 24* 21 21  CREATININE 1.61* 1.51* 1.54*  CALCIUM 9.3 8.7 8.7   Liver Function Tests:  Recent Labs Lab 08/18/13 0913  AST 19  ALT 10  ALKPHOS 72  BILITOT 0.6  PROT 7.1  ALBUMIN 3.4*   No results found for this basename: LIPASE, AMYLASE,  in the last 168 hours No results found for this basename: AMMONIA,  in the last 168 hours CBC:  Recent Labs Lab 08/18/13 0913 08/19/13 0352  WBC 10.8* 9.7  NEUTROABS 7.8*  --   HGB 8.3* 7.8*  HCT 24.6* 23.7*  MCV 85.7 86.8  PLT 310 253   Cardiac Enzymes: No results found for this basename: CKTOTAL, CKMB, CKMBINDEX, TROPONINI,  in the last 168 hours BNP (last 3 results)  Recent Labs  04/13/13 1645 05/07/13 1525 08/18/13 0913  PROBNP 13238.0* 6589.0* 14417.0*   CBG:  Recent Labs Lab 08/18/13 1733 08/18/13 2224 08/19/13 0736 08/19/13 1131  GLUCAP 106* 150* 111* 141*    Recent Results (from the past 240 hour(s))  CULTURE, BLOOD (ROUTINE X 2)     Status: None   Collection Time    08/18/13  8:35 AM      Result Value Ref Range Status     Specimen Description BLOOD RIGHT ANTECUBITAL   Final   Special Requests BOTTLES DRAWN AEROBIC AND ANAEROBIC 1.5CC   Final   Culture  Setup Time     Final   Value: 08/18/2013 11:49     Performed at [facility]   Culture     Final   Value:        BLOOD CULTURE RECEIVED NO GROWTH TO DATE CULTURE WILL BE HELD FOR 5 DAYS BEFORE ISSUING A FINAL NEGATIVE REPORT     Performed at [facility]   Report Status PENDING   Incomplete  CULTURE, BLOOD (ROUTINE X 2)     Status: None   Collection Time    08/18/13  8:55 AM      Result Value Ref Range Status   Specimen Description BLOOD RIGHT THUMB   Final   Special Requests BOTTLES DRAWN AEROBIC ONLY 2CC   Final   Culture  Setup Time     Final   Value: 08/18/2013 11:49     Performed at [facility]   Culture     Final   Value:        BLOOD CULTURE RECEIVED NO GROWTH TO DATE CULTURE  WILL BE HELD FOR 5 DAYS BEFORE ISSUING A FINAL NEGATIVE REPORT     Performed at [facility]   Report Status PENDING   Incomplete  URINE CULTURE     Status: None   Collection Time    08/18/13 12:24 PM      Result Value Ref Range Status   Specimen Description URINE, CLEAN CATCH   Final   Special Requests Immunocompromised   Final   Culture  Setup Time     Final   Value: 08/18/2013 14:53     Performed at [place]     Final   Value: 25,000 COLONIES/ML     Performed at [facility]   Culture     Final   Value: Multiple bacterial morphotypes present, none predominant. Suggest appropriate recollection if clinically indicated.     Performed at [facility]   Report Status 08/19/2013 FINAL   Final        Studies: Dg Chest Port 1 View  08/18/2013   CLINICAL DATA:  Cough, hypoxia and fever. History of high blood pressure and breast cancer.  EXAM: PORTABLE CHEST - 1 VIEW  COMPARISON:  07/19/2013 and 04/13/2013.  FINDINGS: Asymmetric airspace disease superimposed upon chronic lung changes may  represent mild pulmonary edema. No segmental consolidation. Difficult to exclude interstitial infectious process given the patient's history of fever and chronic lung changes.  No obvious pulmonary metastatic disease.  Cardiomegaly.  Calcified tortuous aorta.  IMPRESSION: Asymmetric airspace disease superimposed upon chronic lung changes may represent mild pulmonary edema. Please see above.  Cardiomegaly.  Calcified mildly tortuous aorta.   Electronically Signed   By: Chauncey Cruel M.D.   On: 08/18/2013 08:34        Scheduled Meds: . amLODipine  5 mg Oral QHS   And  . irbesartan  300 mg Oral QHS  . apixaban  5 mg Oral BID  . atorvastatin  10 mg Oral q1800  . azithromycin  500 mg Intravenous Q24H  . cefTRIAXone (ROCEPHIN)  IV  1 g Intravenous Q24H  . cholecalciferol  3,000 Units Oral Daily  . furosemide  20 mg Oral Daily  . gabapentin  100 mg Oral BID AC  . guaiFENesin  600 mg Oral BID  . insulin aspart  0-9 Units Subcutaneous TID WC  . insulin glargine  10 Units Subcutaneous QHS  . ipratropium-albuterol  3 mL Nebulization QID  . levothyroxine  125 mcg Oral QAC breakfast  . mometasone-formoterol  2 puff Inhalation BID  . nebivolol  10 mg Oral QPC breakfast  . predniSONE  2.5 mg Oral Q48H  . predniSONE  5 mg Oral Q48H  . pyridOXINE  100 mg Oral q morning - 10a  . sodium chloride  3 mL Intravenous Q12H   Continuous Infusions:   Principal Problem:   CAP (community acquired pneumonia) Active Problems:   HYPOTHYROIDISM   HYPERLIPIDEMIA   HYPONATREMIA   HYPERTENSION   Type 2 diabetes mellitus   Atrial fibrillation   Anemia   Acute respiratory failure   Chronic kidney disease (CKD), stage IV (severe)   Diastolic CHF, chronic   COPD (chronic obstructive pulmonary disease)   Sepsis   Hand pain, left    Time spent: 25 minutes    Modena Jansky, MD, FACP, [facility]. [facility] Pager [phone]  If 7PM-7AM, please contact night-coverage [email or web address] Password  [email or web address] 08/19/2013, 4:40 PM    LOS: 1 day

## 2013-08-20 DIAGNOSIS — I4891 Unspecified atrial fibrillation: Secondary | ICD-10-CM

## 2013-08-20 LAB — GLUCOSE, CAPILLARY
GLUCOSE-CAPILLARY: 114 mg/dL — AB (ref 70–99)
Glucose-Capillary: 186 mg/dL — ABNORMAL HIGH (ref 70–99)
Glucose-Capillary: 175 mg/dL — ABNORMAL HIGH (ref 70–99)
Glucose-Capillary: 168 mg/dL — ABNORMAL HIGH (ref 70–99)

## 2013-08-20 LAB — CBC
HEMATOCRIT: 23.1 % — AB (ref 36.0–46.0)
HEMOGLOBIN: 7.8 g/dL — AB (ref 12.0–15.0)
MCH: 29.8 pg (ref 26.0–34.0)
MCHC: 33.8 g/dL (ref 30.0–36.0)
MCV: 88.2 fL (ref 78.0–100.0)
Platelets: 276 10*3/uL (ref 150–400)
RBC: 2.62 MIL/uL — ABNORMAL LOW (ref 3.87–5.11)
RDW: 14.3 % (ref 11.5–15.5)
WBC: 9.3 10*3/uL (ref 4.0–10.5)

## 2013-08-20 LAB — BASIC METABOLIC PANEL
BUN: 25 mg/dL — ABNORMAL HIGH (ref 6–23)
CHLORIDE: 92 meq/L — AB (ref 96–112)
CO2: 24 mEq/L (ref 19–32)
Calcium: 8.7 mg/dL (ref 8.4–10.5)
Creatinine, Ser: 1.81 mg/dL — ABNORMAL HIGH (ref 0.50–1.10)
GFR calc non Af Amer: 26 mL/min — ABNORMAL LOW (ref >90)
GFR, EST AFRICAN AMERICAN: 30 mL/min — AB (ref >90)
GLUCOSE: 124 mg/dL — AB (ref 70–99)
POTASSIUM: 4.6 meq/L (ref 3.7–5.3)
Sodium: 127 mEq/L — ABNORMAL LOW (ref 137–147)

## 2013-08-20 MED ORDER — LEVOFLOXACIN 250 MG PO TABS: 250.0000 mg | ORAL_TABLET | Freq: Every day | ORAL | Status: DC

## 2013-08-20 MED ORDER — GUAIFENESIN ER 600 MG PO TB12: 600.0000 mg | ORAL_TABLET | Freq: Two times a day (BID) | ORAL | Status: DC

## 2013-08-20 NOTE — Care Management Note (Signed)
MD order for HHPT.Per pt choice [facility] to provide [facility] services at discharge. [facility] weekend rep notified at [phone].   Venita Lick Talissa Apple,MSN,RN [phone]

## 2013-08-20 NOTE — Progress Notes (Signed)
SATURATION QUALIFICATIONS: (This note is used to comply with regulatory documentation for home oxygen)  Patient Saturations on Room Air at Rest = 93%  Patient Saturations on Room Air while Ambulating = 88%  Patient Saturations on 2 Liters of oxygen while Ambulating= 95%

## 2013-08-20 NOTE — Assessment & Plan Note (Signed)
Mild bronchitis exacerbation she attributes to pollen exposure from sitting on her porch Plan-try reducing prednisone to 10 mg alternating with 5 mg every other day. Steroid talk

## 2013-08-20 NOTE — Progress Notes (Signed)
Addendum  Patient was discharged this morning. Subsequently she started having nausea, dry heaves and ? Vomiting. She was observed and improved. She tolerated lunch but was apprehensive of going home tonight, incase symptoms were to recur. DC held until 08/21/13  Modena Jansky, MD, FACP, [facility]. [facility] Pager [phone]  If 7PM-7AM, please contact night-coverage [email or web address] Password [facility] 08/20/2013, 6:11 PM

## 2013-08-20 NOTE — Discharge Summary (Addendum)
Physician Discharge Summary  [name] [name] MVE:[id number] DOB: 12-26-1932 DOA: 08/18/2013  PCP: [name], MD Outpatient Specialists:   Pulmonology: Dr. Baird Lyons.  Cardiology: Dr. Peter Martinique.  Medical Oncology: Dr. Lurline Del.   Admit date: 08/18/2013 Discharge date: 08/21/2013  Time spent: Less than 30 minutes  Recommendations for Outpatient Follow-up:  1. Dr. Carolann Littler, PCP in 3 days with repeat labs (CBC & BMP). Please followup final blood culture and sputum culture results that were sent from the hospital. 2. Dr. Baird Lyons, Pulmonology in one week 3. Home health PT 4. Oxygen via nasal cannula at 2 L per minute, continuosly. 5. Recommend repeating chest x-ray in 4-6 weeks.  Discharge Diagnoses:  Principal Problem:   CAP (community acquired pneumonia) Active Problems:   HYPOTHYROIDISM   HYPERLIPIDEMIA   HYPONATREMIA   HYPERTENSION   Type 2 diabetes mellitus   Atrial fibrillation   Anemia   Acute respiratory failure   Chronic kidney disease (CKD), stage IV (severe)   Diastolic CHF, chronic   COPD (chronic obstructive pulmonary disease)   Sepsis   Hand pain, left   Discharge Condition: Improved & Stable  Diet recommendation: Heart healthy and diabetic diet.   Filed Weights   08/19/13 0500 08/20/13 0520 08/21/13 0520  Weight: 91.581 kg (201 lb 14.4 oz) 91.6 kg (201 lb 15.1 oz) 91.4 kg (201 lb 8 oz)    History of present illness:  80 y.o. female with extensive PMH-DM 2, HTN, CVA, A. 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 She complained  of productive cough. In the ED, noted to be febrile 101.96F, tachypneic, hypoxic at 87% on room air, sodium 126, chloride 87, BUN 24, creatinine 1.61, WBC 10.8, hemoglobin 8.3 pro BNP 14 417 and chest x-ray reported as asymmetric airspace disease superimposed on chronic lung changes.   Hospital Course:  1. Community acquired pneumonia versus ? acute bronchitis: Blood cultures x2: Negative to date. Urine culture suggests contamination. Urinary Legionella and streptococcal antigen negative. Patient was empirically treated with IV Rocephin and azithromycin. She has clinically improved. She will be discharged on oral levofloxacin to complete total 7 days treatment.  2. Sepsis: Secondary to pneumonia. Management as indicated above. Hemodynamically stable. Sepsis resolved. 3. Acute hypoxic respiratory failure: Secondary to pneumonia complicating underlying COPD, pulmonary Wegener's disease, chronic diastolic CHF. Treat underlying pneumonia, oxygen supplementation, bronchodilator nebulizations and continue chronic prednisone. Improved. Patient continues to be hypoxemic on room air with activity & at rest. She will be discharged on home oxygen. She has been on Home O2 in the past and was able to come off it. This can be further evaluated and oxygen titrated down as deemed appropriate. 4. Hyponatremia: Appears to be chronic.? SIADH. Asymptomatic. Patient is status post 1 L of IV normal saline bolus in the ED. DC'd IV fluids due to history of CHF and does not look overly dehydrated. Sodium stable. Outpatient followup.  5. Left hand pain:? Secondary to osteoarthritis. Clinically not suspicious for vascular or neurological etiology. No reported history of gout. Patient has no further joint or hand pain. On 5/15, she had a small area of faint redness over the dorsum of left hand without other  acute findings-has resolved. Unclear etiology. Denies insect bites.  6. Hypertension: Controlled. Continue home  medications 7. Type II DM with nephropathy: Continue Lantus and home by mouth medications. 8. Anemia: Possibly secondary to chronic kidney disease. Seems stable. Transfuse for hemoglobin less than 7 g per DL. Follow CBCs as outpatient in a couple of days. 9. Stage IV chronic kidney disease: Creatinine slightly higher today at 1.8 compared to 1.5-1.6 over the last couple of days. Followup BMP in a couple of days as outpatient.  10. Atrial fibrillation: With controlled ventricular rate. Continue beta blockers and AC with Apixaban 11. History of hypothyroid: Continue Synthroid 12. Chronic diastolic CHF: Compensated. Continue Lasix 13. History of COPD/pulmonary Wegener's disease: Management as above  14. Nausea: she had some nausea and dry heaves yesterday but no abdominal pain or vomiting. She had BM on day of admission and passing flatus. She give previous h/o intermittent nausea at home which is relieved by drinking sodas. She was apprehensive going home yesterday and hence was observed overnight. She has improved and will be DC'ed on PO Zofran PRN and pepcid- she is on chronic Prednisone and may have some gastritis. If symptoms persists, consider further OP evaluation. Discussed extensively with spouse at bedside.   Consultations:  None  Procedures:  None    Discharge Exam:  Complaints:  Feels much better. Decreased cough with intermittent mild green sputum but much improved. Denies dyspnea. Anxious to go home. Denies pain in the left hand. No further nausea. Tolerated diet.  Filed Vitals:   08/20/13 1610 08/20/13 2200 08/21/13 0520 08/21/13 0833  BP: 107/45 119/62 121/65   Pulse: 30 65 59   Temp: 98.1 F (36.7 C) 98.7 F (37.1 C) 98.3 F (36.8 C)   TempSrc: Oral Oral Oral   Resp: _0 Height:      Weight:   91.4 kg (201 lb 8 oz)   SpO2: 100% 100% 100% 94%    General exam: Pleasant elderly female sitting up comfortably in bed.  Respiratory system: Reduced breath sounds  in the bases with chronic sounding Velcro-like basal crackles. Rest of lung fields with improved BS's. No increased work of breathing.  Cardiovascular system: S1 & S2 heard, RRR. No JVD, murmurs, gallops, clicks or pedal edema.  Gastrointestinal system: Abdomen is nondistended, soft and nontender. Normal bowel sounds heard.  Central nervous system: Alert and oriented. No focal neurological deficits.  Extremities: Symmetric 5 x 5 power. Faint mild redness over dorsum of left hand but no tenderness or other acute findings- resolved.   Discharge Instructions      Discharge Instructions   Call MD for:  difficulty breathing, headache or visual disturbances    Complete by:  As directed      Call MD for:  temperature >100.4    Complete by:  As directed      Diet - low sodium heart healthy    Complete by:  As directed      Diet Carb Modified    Complete by:  As directed      Discharge instructions    Complete by:  As directed   Use Oxygen via nasal cannula at 2 L per minute continuosly.     Increase activity slowly    Complete by:  As directed             Medication List    STOP taking these medications       HYDROcodone-homatropine 5-1.5 MG/5ML syrup  Commonly known  as:  HYCODAN      TAKE these medications       acetaminophen 500 MG tablet  Commonly known as:  TYLENOL  Take 500 mg by mouth every 6 (six) hours as needed.     albuterol (2.5 MG/3ML) 0.083% nebulizer solution  Commonly known as:  PROVENTIL  Take 2.5 mg by nebulization every 6 (six) hours as needed for wheezing or shortness of breath.     AZOR 5-40 MG per tablet  Generic drug:  amLODipine-olmesartan  Take 1 tablet by mouth at bedtime.     CALCIUM CITRATE PO  Take 1,200 mg by mouth daily.     cholecalciferol 1000 UNITS tablet  Commonly known as:  VITAMIN D  Take 3,000 Units by mouth daily.     diazepam 5 MG tablet  Commonly known as:  VALIUM  Take 5 mg by mouth daily as needed for anxiety.     ELIQUIS  5 MG Tabs tablet  Generic drug:  apixaban  Take 5 mg by mouth 2 (two) times daily.     famotidine 20 MG tablet  Commonly known as:  PEPCID  Take 1 tablet (20 mg total) by mouth daily.     furosemide 20 MG tablet  Commonly known as:  LASIX  Take 1 tablet (20 mg total) by mouth daily.     gabapentin 100 MG capsule  Commonly known as:  NEURONTIN  Take 1 capsule (100 mg total) by mouth 2 (two) times daily before a meal.     glimepiride 2 MG tablet  Commonly known as:  AMARYL  Take 1 tablet (2 mg total) by mouth daily before breakfast.     guaiFENesin 600 MG 12 hr tablet  Commonly known as:  MUCINEX  Take 1 tablet (600 mg total) by mouth 2 (two) times daily.     insulin glargine 100 UNIT/ML injection  Commonly known as:  LANTUS  Inject 10 Units into the skin at bedtime.     levofloxacin 250 MG tablet  Commonly known as:  LEVAQUIN  Take 1 tablet (250 mg total) by mouth daily.     levothyroxine 125 MCG tablet  Commonly known as:  SYNTHROID, LEVOTHROID  Take 125 mcg by mouth daily before breakfast.     loratadine 10 MG tablet  Commonly known as:  CLARITIN  Take 10 mg by mouth daily as needed for allergies.     mometasone-formoterol 100-5 MCG/ACT Aero  Commonly known as:  DULERA  Inhale 2 puffs into the lungs 2 (two) times daily. Inhale 2 puffs then rinse mouth     nebivolol 10 MG tablet  Commonly known as:  BYSTOLIC  Take 10 mg by mouth daily after breakfast.     ondansetron 4 MG tablet  Commonly known as:  ZOFRAN  Take 1 tablet (4 mg total) by mouth every 6 (six) hours as needed for nausea.     predniSONE 10 MG tablet  Commonly known as:  DELTASONE  Take 5-10 mg by mouth daily with breakfast. Takes 86m every other day, then 2.571mthe other days     pyridOXINE 100 MG tablet  Commonly known as:  VITAMIN B-6  Take 100 mg by mouth every morning.     rosuvastatin 5 MG tablet  Commonly known as:  CRESTOR  Take 1 tablet (5 mg total) by mouth daily.     sodium  chloride 0.65 % Soln nasal spray  Commonly known as:  OCEAN  Place 1 spray into both  nostrils as needed for congestion.       Follow-up Information   Follow up with [name], MD. Schedule an appointment as soon as possible for a visit in 3 days. (To be seen with repeat labs (CBC & BMP).)    Specialty:  Family Medicine   Contact information:   [place] [address] [phone]       Follow up with Deneise Lever, MD. Schedule an appointment as soon as possible for a visit in 1 week.   Specialty:  Pulmonary Disease   Contact information:   [address]  [facility] [place] [address] [phone]        The results of significant diagnostics from this hospitalization (including imaging, microbiology, ancillary and laboratory) are listed below for reference.    Significant Diagnostic Studies: Dg Chest Port 1 View  08/18/2013   CLINICAL DATA:  Cough, hypoxia and fever. History of high blood pressure and breast cancer.  EXAM: PORTABLE CHEST - 1 VIEW  COMPARISON:  07/19/2013 and 04/13/2013.  FINDINGS: Asymmetric airspace disease superimposed upon chronic lung changes may represent mild pulmonary edema. No segmental consolidation. Difficult to exclude interstitial infectious process given the patient's history of fever and chronic lung changes.  No obvious pulmonary metastatic disease.  Cardiomegaly.  Calcified tortuous aorta.  IMPRESSION: Asymmetric airspace disease superimposed upon chronic lung changes may represent mild pulmonary edema. Please see above.  Cardiomegaly.  Calcified mildly tortuous aorta.   Electronically Signed   By: Chauncey Cruel M.D.   On: 08/18/2013 08:34    Microbiology: Recent Results (from the past 240 hour(s))  CULTURE, BLOOD (ROUTINE X 2)     Status: None   Collection Time    08/18/13  8:35 AM      Result Value Ref Range Status   Specimen Description BLOOD RIGHT ANTECUBITAL   Final   Special Requests  BOTTLES DRAWN AEROBIC AND ANAEROBIC 1.5CC   Final   Culture  Setup Time     Final   Value: 08/18/2013 11:49     Performed at [facility]   Culture     Final   Value:        BLOOD CULTURE RECEIVED NO GROWTH TO DATE CULTURE WILL BE HELD FOR 5 DAYS BEFORE ISSUING A FINAL NEGATIVE REPORT     Performed at [facility]   Report Status PENDING   Incomplete  CULTURE, BLOOD (ROUTINE X 2)     Status: None   Collection Time    08/18/13  8:55 AM      Result Value Ref Range Status   Specimen Description BLOOD RIGHT THUMB   Final   Special Requests BOTTLES DRAWN AEROBIC ONLY 2CC   Final   Culture  Setup Time     Final   Value: 08/18/2013 11:49     Performed at [facility]   Culture     Final   Value:        BLOOD CULTURE RECEIVED NO GROWTH TO DATE CULTURE WILL BE HELD FOR 5 DAYS BEFORE ISSUING A FINAL NEGATIVE REPORT     Performed at [facility]   Report Status PENDING   Incomplete  URINE CULTURE     Status: None   Collection Time    08/18/13 12:24 PM      Result Value Ref Range Status   Specimen Description URINE, CLEAN CATCH   Final   Special Requests Immunocompromised   Final  Culture  Setup Time     Final   Value: 08/18/2013 14:53     Performed at [facility]     Final   Value: 25,000 COLONIES/ML     Performed at [facility]   Culture     Final   Value: Multiple bacterial morphotypes present, none predominant. Suggest appropriate recollection if clinically indicated.     Performed at [facility]   Report Status 08/19/2013 FINAL   Final  CULTURE, EXPECTORATED SPUTUM-ASSESSMENT     Status: None   Collection Time    08/19/13  4:24 PM      Result Value Ref Range Status   Specimen Description SPUTUM   Final   Special Requests Immunocompromised   Final   Sputum evaluation     Final   Value: THIS SPECIMEN IS ACCEPTABLE. RESPIRATORY CULTURE REPORT TO FOLLOW.   Report Status 08/19/2013 FINAL   Final      Labs: Basic Metabolic Panel:  Recent Labs Lab 08/18/13 0913 08/18/13 1704 08/19/13 0352 08/20/13 0505  NA 126* 125* 126* 127*  K 4.2 4.2 4.3 4.6  CL 87* 88* 89* 92*  CO2 _0 GLUCOSE 90 112* 141* 124*  BUN 24* 21 21 25*  CREATININE 1.61* 1.51* 1.54* 1.81*  CALCIUM 9.3 8.7 8.7 8.7   Liver Function Tests:  Recent Labs Lab 08/18/13 0913  AST 19  ALT 10  ALKPHOS 72  BILITOT 0.6  PROT 7.1  ALBUMIN 3.4*   No results found for this basename: LIPASE, AMYLASE,  in the last 168 hours No results found for this basename: AMMONIA,  in the last 168 hours CBC:  Recent Labs Lab 08/18/13 0913 08/19/13 0352 08/20/13 0505  WBC 10.8* 9.7 9.3  NEUTROABS 7.8*  --   --   HGB 8.3* 7.8* 7.8*  HCT 24.6* 23.7* 23.1*  MCV 85.7 86.8 88.2  PLT 310 253 276   Cardiac Enzymes: No results found for this basename: CKTOTAL, CKMB, CKMBINDEX, TROPONINI,  in the last 168 hours BNP: BNP (last 3 results)  Recent Labs  04/13/13 1645 05/07/13 1525 08/18/13 0913  PROBNP 13238.0* 6589.0* 14417.0*   CBG:  Recent Labs Lab 08/20/13 0740 08/20/13 1204 08/20/13 1710 08/20/13 2108 08/21/13 0701  GLUCAP 114* 186* 175* 168* 121*   Additional labs  ABG on admission: PH 7.46, PCO2 34, PO2 81, bicarbonate 23 and oxygen saturation 96% on 2 L oxygen.   Signed:  Modena Jansky, MD, FACP, [facility]. [facility] Pager [phone]  If 7PM-7AM, please contact night-coverage [email or web address] Password [email or web address] 08/21/2013, 9:05 AM

## 2013-08-20 NOTE — Assessment & Plan Note (Signed)
Plan- CXR

## 2013-08-21 DIAGNOSIS — R110 Nausea: Secondary | ICD-10-CM

## 2013-08-21 LAB — GLUCOSE, CAPILLARY: GLUCOSE-CAPILLARY: 121 mg/dL — AB (ref 70–99)

## 2013-08-21 MED ORDER — IPRATROPIUM-ALBUTEROL 0.5-2.5 (3) MG/3ML IN SOLN
3.0000 mL | Freq: Two times a day (BID) | RESPIRATORY_TRACT | Status: DC
  Administered 2013-08-21: 3 mL via RESPIRATORY_TRACT
  Filled 2013-08-21: qty 3

## 2013-08-21 MED ORDER — ALBUTEROL SULFATE (2.5 MG/3ML) 0.083% IN NEBU: 2.5000 mg | INHALATION_SOLUTION | Freq: Four times a day (QID) | RESPIRATORY_TRACT | Status: DC | PRN

## 2013-08-21 MED ORDER — FAMOTIDINE 20 MG PO TABS: 20.0000 mg | ORAL_TABLET | Freq: Every day | ORAL | Status: DC

## 2013-08-21 MED ORDER — ONDANSETRON HCL 4 MG PO TABS: 4.0000 mg | ORAL_TABLET | Freq: Four times a day (QID) | ORAL | Status: DC | PRN

## 2013-08-21 NOTE — Progress Notes (Signed)
Patient had been discharged on 08/20/13 a.m. However subsequently she started having some nausea and dry heaves without vomiting. This subsequently improved and she was able to tolerate diet. Last BM was on the day of admission. Passing flatus. She however was apprehensive about going home yesterday and was monitored overnight. She has no further nausea or dry heaves. She is tolerating diet. She indicates that she has intermittent nausea even at home for which he drinks soda for relief. Unclear if she has some gastritis and is on chronic steroids. Have added oral Pepcid and when necessary Zofran to discharge medications. Patient denies dyspnea and states that her breathing is at baseline. As per nursing update, she did desaturate to 87% on room air yesterday even at rest and her oxygen for home will be changed to continuously at 2 L per minute. On exam, vital signs stable, patient in no obvious distress. She appears pleasant and comfortable. Able to speak in full sentences. Respiratory system-slightly harsh breath sounds bilaterally and chronic/Velcro-like coarse crackles in the bases right >left, occasional rhonchi. No increased work of breathing. CVS: S1 and S2 heard, irregularly irregular. No JVD, murmurs or pedal edema. Abdomen: Nondistended soft and nontender. Normal bowel sounds heard. CNS: Alert and oriented. No focal deficits. Extremities: Symmetric 5 x 5 power.  Assessment and plan: Intermittent nausea: Unclear etiology-? Gastritis. Since patient is on chronic prednisone, will add oral Pepcid and when necessary by mouth Zofran to discharge medications. However, if her symptoms persist or worsen, may consider further outpatient evaluation. Patient will be discharged home. Discussed extensively with patient and her spouse at bedside. They are comfortable and agreeable with this plan.   Modena Jansky, MD, FACP, [facility]. [facility] Pager [phone]  If 7PM-7AM, please contact  night-coverage [email or web address] Password [email or web address] 08/21/2013, 11:44 AM

## 2013-08-21 NOTE — Progress Notes (Signed)
Pt desat on RA at 87% while resting, applied O2 via 2L [place] with O2 sat 93%

## 2013-08-21 NOTE — Plan of Care (Signed)
Problem: Phase II Progression Outcomes Goal: Wean O2 if indicated Outcome: Not Applicable Date Met:  50/01/64 Will go home on O2

## 2013-08-23 LAB — CULTURE, RESPIRATORY: CULTURE: NORMAL

## 2013-08-23 LAB — CULTURE, RESPIRATORY W GRAM STAIN

## 2013-08-24 LAB — CULTURE, BLOOD (ROUTINE X 2)
CULTURE: NO GROWTH
Culture: NO GROWTH

## 2013-08-25 ENCOUNTER — Ambulatory Visit (INDEPENDENT_AMBULATORY_CARE_PROVIDER_SITE_OTHER)
Admission: RE | Admit: 2013-08-25 | Discharge: 2013-08-25 | Disposition: A | Discharge status: Home / Self Care | Payer: Medicare Other | Source: Ambulatory Visit | Attending: Adult Health | Admitting: Adult Health

## 2013-08-25 ENCOUNTER — Encounter: Payer: Self-pay | Admitting: Adult Health

## 2013-08-25 ENCOUNTER — Telehealth: Payer: Self-pay | Admitting: Family Medicine

## 2013-08-25 ENCOUNTER — Encounter: Payer: Self-pay | Admitting: Family Medicine

## 2013-08-25 ENCOUNTER — Ambulatory Visit (INDEPENDENT_AMBULATORY_CARE_PROVIDER_SITE_OTHER): Payer: Medicare Other | Admitting: Family Medicine

## 2013-08-25 ENCOUNTER — Ambulatory Visit: Payer: Medicare Other | Admitting: Cardiology

## 2013-08-25 ENCOUNTER — Ambulatory Visit: Payer: Medicare Other | Admitting: Adult Health

## 2013-08-25 VITALS — BP 128/80 | HR 86 | Temp 98.0°F | Wt 202.0 lb

## 2013-08-25 VITALS — BP 130/82 | HR 79 | Temp 97.9°F | Ht 66.0 in | Wt 202.0 lb

## 2013-08-25 DIAGNOSIS — J189 Pneumonia, unspecified organism: Secondary | ICD-10-CM

## 2013-08-25 DIAGNOSIS — D649 Anemia, unspecified: Secondary | ICD-10-CM

## 2013-08-25 DIAGNOSIS — E871 Hypo-osmolality and hyponatremia: Secondary | ICD-10-CM

## 2013-08-25 DIAGNOSIS — N184 Chronic kidney disease, stage 4 (severe): Secondary | ICD-10-CM

## 2013-08-25 DIAGNOSIS — J441 Chronic obstructive pulmonary disease with (acute) exacerbation: Secondary | ICD-10-CM

## 2013-08-25 DIAGNOSIS — E119 Type 2 diabetes mellitus without complications: Secondary | ICD-10-CM

## 2013-08-25 LAB — CBC WITH DIFFERENTIAL/PLATELET
BASOS ABS: 0 10*3/uL (ref 0.0–0.1)
BASOS PCT: 0.4 % (ref 0.0–3.0)
EOS ABS: 0.1 10*3/uL (ref 0.0–0.7)
Eosinophils Relative: 1.2 % (ref 0.0–5.0)
LYMPHS ABS: 1.4 10*3/uL (ref 0.7–4.0)
Lymphocytes Relative: 16.9 % (ref 12.0–46.0)
MCHC: 32.8 g/dL (ref 30.0–36.0)
MCV: 88.3 fl (ref 78.0–100.0)
Monocytes Absolute: 0.8 10*3/uL (ref 0.1–1.0)
Monocytes Relative: 9.5 % (ref 3.0–12.0)
Neutro Abs: 6.1 10*3/uL (ref 1.4–7.7)
Neutrophils Relative %: 72 % (ref 43.0–77.0)
Platelets: 389 10*3/uL (ref 150.0–400.0)
RBC: 2.83 Mil/uL — AB (ref 3.87–5.11)
RDW: 14.5 % (ref 11.5–15.5)
WBC: 8.5 10*3/uL (ref 4.0–10.5)

## 2013-08-25 LAB — BASIC METABOLIC PANEL
BUN: 25 mg/dL — ABNORMAL HIGH (ref 6–23)
CHLORIDE: 93 meq/L — AB (ref 96–112)
CO2: 29 meq/L (ref 19–32)
Calcium: 9.2 mg/dL (ref 8.4–10.5)
Creatinine, Ser: 1.7 mg/dL — ABNORMAL HIGH (ref 0.4–1.2)
GFR: 30.82 mL/min — ABNORMAL LOW (ref >60.00)
GLUCOSE: 123 mg/dL — AB (ref 70–99)
POTASSIUM: 5 meq/L (ref 3.5–5.1)
SODIUM: 129 meq/L — AB (ref 135–145)

## 2013-08-25 MED ORDER — DIAZEPAM 5 MG PO TABS: 5.0000 mg | ORAL_TABLET | Freq: Two times a day (BID) | ORAL | Status: DC | PRN

## 2013-08-25 NOTE — Progress Notes (Signed)
Subjective:    Patient ID: [name] [name], female    DOB: [birth year], 80 y.o.   MRN: [id number]  HPI Hospital followup regarding recent probable community-acquired pneumonia. She was admitted 08/18/2013 and discharged on 5/ 17/2015. She has a complex past medical history with multiple problems including obesity, hypothyroidism, chronic hyponatremia, hyperlipidemia, recurrent depression, hypertension, osteoarthritis, type 2 diabetes, chronic intermittent vertigo, atrial fibrillation, breast cancer, chronic anemia, chronic kidney disease, Wegener's granulomatosis, diastolic heart failure.   She had presented to emergency department with general fatigue, nausea, poor appetite, and reportedly fever 101.2. She was hypoxic with oxygen 87% room air. Chest x-ray showed asymmetric airspace disease superimposed on chronic lung disease. She was admitted with community acquired pneumonia versus acute bronchitis. Blood cultures were negative. Patient treated with IV Rocephin and Zithromax and transitioned to oral Levaquin which she completed today.  She was discharged home on home oxygen. She does think that this was helping some. Her hypertension has been controlled. Diabetes been relatively stable. Recent A1c is low 7 range. Chronic anemia possibly secondary to chronic kidney disease. Recent hemoglobin over 9. Hypothyroidism treated with Synthroid. She is maintained on low-dose prednisone for Wegener's disease/COPD.  Patient did have some malaise. No chest pains. No dizziness.  Past Medical History  Diagnosis Date  . HYPOTHYROIDISM 07/24/2008  . HYPERLIPIDEMIA 07/24/2008  . DEPRESSION 05/16/2009  . HYPERTENSION 07/24/2008  . Vertigo   . Diabetes mellitus type II   . Arthritis     r tkr  . CVA (cerebral vascular accident) 2008    mini stroke.no residual  . Heart murmur     stress test 2009.dr peter Martinique  . Intermittent vertigo   . Skin abnormality     facial lesions .pt applying mupiracin to areas    . Atrial fibrillation   . Breast cancer   . Pneumonia   . 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 Knee surgery  2009    TKR  . Cholecystectomy  1980  . Joint replacement      r knee  . Breast surgery  2007    Lumpectomy, XRT 2006.l breast  . Video assisted thoracoscopy  04/22/2011    Procedure: VIDEO ASSISTED THORACOSCOPY;  Surgeon: Melrose Nakayama, MD;  Location: [facility];  Service: Thoracic;  Laterality: Left;  WITH BIOPSY  . Heel spur surgery Right   . Exploratory laparotomy      reports that she quit smoking about 31 years ago. Her smoking use included Cigarettes. She has a 6 pack-year smoking history. She has never used smokeless tobacco. She reports that she does not drink alcohol or use illicit drugs. family history includes Arthritis in her mother; Breast cancer in her sister; Cancer in her sister; Coronary artery disease in her brother and father; Heart disease in her father; Lung cancer in her brother; Rheum arthritis in her mother. Allergies  Allergen Reactions  . Codeine Sulfate Other (See Comments)    REACTION: GI upset  . Sulfonamide Derivatives Other (See Comments)    REACTION: GI upset  . Atarax [Hydroxyzine] Nausea Only and Rash      Review of Systems  Constitutional: Positive for fatigue. Negative for fever, chills and appetite change.  Respiratory: Positive for shortness of breath. Negative for cough and wheezing.   Cardiovascular: Negative for chest pain, palpitations and leg swelling.  Gastrointestinal: Negative for abdominal pain and blood in stool.  Genitourinary: Negative for dysuria.  Neurological: Positive for  weakness (Generalized). Negative for dizziness, syncope and headaches.  Psychiatric/Behavioral: Negative for confusion.       Objective:   Physical Exam  Constitutional: She is oriented to person, place, and time. She appears well-developed and well-nourished.  HENT:  Right Ear:  External ear normal.  Left Ear: External ear normal.  Mouth/Throat: Oropharynx is clear and moist.  Neck: Neck supple. No thyromegaly present.  Cardiovascular: Normal rate and regular rhythm.   Pulmonary/Chest: Effort normal and breath sounds normal.  Patient has some rales in both bases  Musculoskeletal: She exhibits no edema.  Neurological: She is alert and oriented to person, place, and time.          Assessment & Plan:  #1 recent community-acquired pneumonia. Clinically stable. No recurrent fever. Finish out Levaquin. Repeat CBC #2 chronic anemia probably related to chronic kidney disease. Recheck hemoglobin today #3 chronic hyponatremia. She is no longer on hydrochlorothiazide. Question SIADH. Recheck basic metabolic panel #4 type 2 diabetes. History of good control. Continue close monitoring #5 Wegener's granulomatosis/COPD. Followed by pulmonary. Recommendation for followup chest x-ray in 4-6 weeks

## 2013-08-25 NOTE — Patient Instructions (Signed)
Continue on current regimen .  Mucinex DM Twice daily  As needed  Cough/congestion  Fluids and rest  Advance activity as tolerated.  Continue on Oxygen 2l/m continuously  follow up Dr. Annamaria Boots  In 2 weeks and As needed   Please contact office for sooner follow up if symptoms do not improve or worsen or seek emergency care  Follow up with PCP today as planned for labs

## 2013-08-25 NOTE — Patient Instructions (Signed)
Follow up immediately for any fever or increased shortness of breath.

## 2013-08-25 NOTE — Telephone Encounter (Signed)
Pt has office visit today 08/25/2013

## 2013-08-25 NOTE — Telephone Encounter (Signed)
[facility] [place], [place] - [email or web address]is requesting re-fill on diazepam (VALIUM) 5 MG tablet

## 2013-08-25 NOTE — Progress Notes (Signed)
Pre visit review using our clinic review tool, if applicable. No additional management support is needed unless otherwise documented below in the visit note.

## 2013-08-26 NOTE — Progress Notes (Signed)
Quick Note:  ATC pt at home, line rang numerous times with no answer and no option to LM. WCB. ______

## 2013-08-29 DIAGNOSIS — I509 Heart failure, unspecified: Secondary | ICD-10-CM | POA: Diagnosis not present

## 2013-08-29 DIAGNOSIS — J441 Chronic obstructive pulmonary disease with (acute) exacerbation: Secondary | ICD-10-CM | POA: Diagnosis not present

## 2013-08-29 DIAGNOSIS — Z794 Long term (current) use of insulin: Secondary | ICD-10-CM | POA: Diagnosis not present

## 2013-08-29 DIAGNOSIS — M3130 Wegener's granulomatosis without renal involvement: Secondary | ICD-10-CM | POA: Diagnosis not present

## 2013-08-29 DIAGNOSIS — J189 Pneumonia, unspecified organism: Secondary | ICD-10-CM | POA: Diagnosis not present

## 2013-08-29 DIAGNOSIS — I129 Hypertensive chronic kidney disease with stage 1 through stage 4 chronic kidney disease, or unspecified chronic kidney disease: Secondary | ICD-10-CM | POA: Diagnosis not present

## 2013-08-29 DIAGNOSIS — IMO0001 Reserved for inherently not codable concepts without codable children: Secondary | ICD-10-CM | POA: Diagnosis not present

## 2013-08-29 DIAGNOSIS — I5032 Chronic diastolic (congestive) heart failure: Secondary | ICD-10-CM | POA: Diagnosis not present

## 2013-08-29 DIAGNOSIS — N184 Chronic kidney disease, stage 4 (severe): Secondary | ICD-10-CM | POA: Diagnosis not present

## 2013-08-31 ENCOUNTER — Ambulatory Visit: Payer: Medicare Other | Admitting: Internal Medicine

## 2013-08-31 ENCOUNTER — Telehealth: Payer: Self-pay | Admitting: Family Medicine

## 2013-08-31 NOTE — Telephone Encounter (Signed)
Patient Information:  Caller Name: Dashana  Phone: [phone]  Patient: [name] [name], [name] [name]  Gender: Female  DOB: Sep 08, 1932  Age: 80 Years  PCP: Carolann Littler (Family Practice)  Office Follow Up:  Does the office need to follow up with this patient?: No  Instructions For The Office: N/A  RN Note:  Home care advice and call back parameters reviewed.  Understanding expressed. Encouraged to call back for questions,changes or concerns.  Symptoms  Reason For Call & Symptoms: Patient states "itching all over" . Onset yesterday 08/30/13.  Unknown cause, no new lotions, soaps or products.  No rash noted.  Reviewed Health History In EMR: Yes  Reviewed Medications In EMR: Yes  Reviewed Allergies In EMR: Yes  Reviewed Surgeries / Procedures: Yes  Date of Onset of Symptoms: 08/30/2013  Treatments Tried: washed in dove, cool bath, lotion.  Treatments Tried Worked: No  Guideline(s) Used:  Itching - Widespread  Disposition Per Guideline:   Home Care  Reason For Disposition Reached:   Itching of unknown cause and present < 48 hours  Advice Given:  Reassurance - Itching of Unknown Cause:  The most common cause of itching is dry skin. Itching can also be caused by soaps, chlorine, low humidity, and pollen or other irritants. Sometimes the cause is unknown.  With a few simple measures, the itching will usually get better in 1 to 2 days.  Here is some care advice that should help.  Reassurance - Itching from Pollen and Other Allergens:  Take a shower to remove pollens, animal dander or other allergic substances from the body and hair.  Reassurance - Itching from Dry Air:  For itching due to dry air, run a humidifier. This is mainly needed during the winter season when you are using central heat.  After you shower, use an unscented moisturizing lotion (e.g., Eucerin, Lubriderm, Vaseline Intensive Care). Eucerin Creme is especially helpful for dry/chapped hands.  Here is some more care advice that  should help.  Don  Try not to scratch.  Avoid Soaps:  Avoid all strong soaps (including bubble bath and scented soaps). (Reason: soaps remove natural oils from the skin.) Use gentler soaps like Dove, Olay, or Basis.  Avoid Triggers:  Avoid hot showers and baths. (Reason: heat increases itching.)  Avoid itchy or tight clothing (especially wool).  Avoid sleeping with too many blankets. (Reason: heat and sweating aggravates itching.)  Moisturize the Skin with Lotion:  The best time to apply lotion is right after a bath or shower when the skin is moist. The lotion or cream will help seal in the moisture.  [employer] For Flare-Up:  For flare-ups of itching, take a cool bath without soap for 15 minutes, 1-2 times per day. Pat dry using towel - do not rub.  Another option is an Database administrator [employer]er, television/film set) [employer]. Sprinkle contents of one packet under running faucet. (Caution: this can make bathtub slippery.)  Oral Antihistamine Medication for Itching:  Take an antihistamine by mouth to reduce the itching. Diphenhydramine (Benadryl) is available over-the-counter. Adult dose is 25-50 mg. Take it up to 4 times a day.  Antihistamines may cause sleepiness. Do not drink, drive, or operate dangerous machinery while taking antihistamines.  Read the package instructions thoroughly on all medications that you take.  Call Back If:  Rash occurs  Itching becomes worse or lasts over 48 hours  You become worse.  Call Back If:  Itching becomes worse or lasts over 48 hours  You become worse.  Patient  Will Follow Care Advice:  YES

## 2013-08-31 NOTE — Telephone Encounter (Signed)
Noted.

## 2013-09-01 DIAGNOSIS — J189 Pneumonia, unspecified organism: Secondary | ICD-10-CM | POA: Diagnosis not present

## 2013-09-01 DIAGNOSIS — IMO0001 Reserved for inherently not codable concepts without codable children: Secondary | ICD-10-CM | POA: Diagnosis not present

## 2013-09-01 DIAGNOSIS — M3130 Wegener's granulomatosis without renal involvement: Secondary | ICD-10-CM | POA: Diagnosis not present

## 2013-09-01 DIAGNOSIS — J441 Chronic obstructive pulmonary disease with (acute) exacerbation: Secondary | ICD-10-CM | POA: Diagnosis not present

## 2013-09-01 DIAGNOSIS — I5032 Chronic diastolic (congestive) heart failure: Secondary | ICD-10-CM | POA: Diagnosis not present

## 2013-09-01 NOTE — Progress Notes (Signed)
04/03/11- 80 year old female former smoking history referred courtesy of Dr. Elease Hashimoto with concern of abnormal chest CT. Husband is an allergy patient here. Dr. Erick Blinks recent office note reviewed "Followup from recent pneumonia. Patient had presented here with extreme myalgias and chronic pain along with bilateral shoulder hip pain. Very high sedimentation rate of 120.  She almost total resolution of myalgias with low dose prednisone strongly suggesting polymyalgia rheumatica. She subsequently presented with cough with trace of blood but no dyspnea, fever, reported a pain. Chest x-ray showed bilateral infiltrates suggesting probable bilateral pneumonia the recommendation for CT scan. CT scan revealed no adenopathy and no evidence for a lung mass. Compatible with bilateral pneumonia. Patient placed on antibiotic and much better at this time with no fever and essentially no cough. Her muscle and joint pains are almost 100% resolved with low-dose prednisone. She did present back in September with severe sinus disease suggesting acute sinusitis. CT revealed no acute findings. She denies any arthralgias at this time. No skin rash. No fever." She has been on prednisone 10 mg twice daily since December 13 and feels significantly better. Morning cough has cleared with residual trace phlegm and trace blood. No chest pain. CT chest 03/23/2011 -images reviewed by me-bilateral patchy airspace disease with air bronchograms. No cavitation or definite nodularity seen. She has had right total knee replacement but no generalized arthritis. Childhood exposure to an uncle with tuberculosis. Her PPD skin test was negative. She denies history of lung disease, GERD, DVT, kidney or liver disease. Lab- C-ANCA POS 1:320, P-ANCA NEG. RA 1:23, ANA NEG11 year old female former smoking history referred courtesy of Dr. Elease Hashimoto with concern of abnormal chest CT. Husband is an allergy patient here.  05/13/11- 52 yoF former smoker  followed for bilateral pulmonary infiltrates, incr C-ANCA, s/p VATS bx/ organizing pneumonia, Metastatic intestinal carcinoid. Marland Kitchen Hx breast Ca 2006/ Dr Truddie Coco Husband here. She returns now after left lung biopsy. She has expected left thoracotomy pain but is otherwise stable as she continues maintenance prednisone. Denies blood, fever, swollen glands, discolored sputum. Exertional dyspnea is not worse, allowing for the surgery. I shared with her and her husband the available initial pathology: Patchy organizing pneumonia with chronic interstitial pneumonia and focal atypical glands. I reviewed the discussion about her at thoracic oncology conference. The parenchymal lung process is not a vasculitis as I originally questioned. I told her there was an incidental finding of cancer cells with further identification pending but that this was a separate process. We agreed to refer her to Dr. Truddie Coco who managed her breast cancer. After she had left, personal communication from Dr. Jimmy Footman- neuroendocrine tumor staining consistent with metastatic intestinal carcinoid.  06/20/11  11 yoF former smoker followed for bilateral pulmonary infiltrates, incr C-ANCA, s/p VATS bx/ organizing pneumonia, Metastatic intestinal carcinoid. Marland Kitchen Hx breast Ca 2006/ Dr Truddie Coco   Husband here She says her breathing is now "fine" back to her normal. She denies fever, cough, sputum. I spoke with Mrs. Roehrs myself and had to explain the discovery of carcinoid after her lung biopsy. We also have the note from the [facility] indicating they discussed it with her. She has absolutely no recollection. Little cough or shortness of breath now. Still a little posterior thoracotomy incision pain. CT 05/19/11- images reviewed with them- IMPRESSION:  1. Marked improvement in bilateral air space disease seen on  comparison CT of 03/24/2011. Near complete resolution.  2. Postoperative change in the left hemithorax consistent with  recent VATS.   Original Report  Authenticated By: Suzy Bouchard, M.D.   11/07/11- 57 yoF former smoker followed for bilateral pulmonary infiltrates, incr C-ANCA, s/p VATS bx/ organizing pneumonia, Metastatic intestinal carcinoid. Marland Kitchen Hx breast Ca 2006/ Dr Truddie Coco    c/o  Chronic vertigo, difficulty breathing and gets tired easy. Also some wheezing, post nasal,  and cough.  Some days she feels much better than others, no real pattern. Occasional light cough to clear throat. We had called in prednisone, which did help. Ambulatory around home, but gets out little. CT chest 09/09/11- I reviewed w/ her- there is significant patchy bilateral lower zone fibrotic scarring and some bronchial thickening. IMPRESSION:  1. No evidence of thoracic metastatic disease or other active  process.  2. Small mild cardiomegaly and hiatal hernia.  Original Report Authenticated By: Marlaine Hind, M.D.    02/09/12-  27 yoF former smoker followed for bilateral pulmonary infiltrates, incr C-ANCA, s/p VATS bx/ cryptogenic organizing pneumonia/ BOOP, Metastatic intestinal carcinoid. Marland Kitchen Hx breast Ca 2006/ Dr Truddie Coco  January had biopsy to ck for other lung condition. SOB and wheezing at times. Left sided pain around the  Back. Continues Advair 250. Due for followup chest CT in December for Dr. Jacquiline Doe. Not on prednisone. She says breathing is unchanged. Denies pain, blood or swollen nodes.   05/11/12-  89 yoF former smoker followed for bilateral pulmonary infiltrates, incr C-ANCA, s/p VATS bx/ cryptogenic organizing pneumonia/ BOOP, Metastatic intestinal carcinoid. Complicated by Hx breast Ca 2006/ Dr Truddie Coco. AFib/ Dr Martinique, DM. She says her husband is developing dementia. Deep breath makes her left flank feel tight where she had thoracentesis. Complains of itching since starting Eliquis.Atarax 50 mg was too strong and Benadryl did not help. Itching keeps her awake all night. No visible rash. She is convinced really unpleasant itching began when  she started Eliquis. I explained this medication needs to be transitioned carefully to anything else and Dr. Martinique would need to be the one to manage that. CT chest 03/26/12 . IMPRESSION:  1. No findings to strongly suggest metastatic disease to the  thorax.  2. There is a new focus of nodular ground-glass attenuation  measuring 1 cm in the anterior aspect of the left upper lobe which  is highly nonspecific. Initial follow-up by chest CT without  contrast is recommended in 3 months to confirm persistence. This  recommendation follows the consensus statement: Recommendations for  the Management of Subsolid Pulmonary Nodules Detected at CT: A  Statement from the Fleischner Society as published in Radiology  2013; 266:304-317.  3. Atherosclerosis, including left main and three-vessel coronary  artery disease. Assessment for potential risk factor  modification, dietary therapy or pharmacologic therapy may be  warranted, if clinically indicated.  4. Slight increase in the small amount of pericardial fluid  compared to the prior examination. This is unlikely to be of  hemodynamic significance at this time.  5. Moderate cardiomegaly.  6. Additional incidental findings, similar to prior studies, as  discussed above.  Original Report Authenticated By: Vinnie Langton, M.D.   08/08/12- 15 yoF former smoker followed for bilateral pulmonary infiltrates/ nodules, incr C-ANCA, s/p VATS bx/ cryptogenic organizing pneumonia/ BOOP, Metastatic intestinal carcinoid. Complicated by Hx breast Ca 2006/ Dr Truddie Coco. AFib/ Dr Martinique, DM. Itching stopped, while continuing Eliquis. She had changed detergents and fabric softener, but cause unknown. Seasonal rhinitis symptoms now. Total IgE 05/11/2012 was less than 1.5. New burning itch, intermittent, right lateral ribs x2 weeks. Has had shingles shot. No visible rash. CT chest  08/02/12 IMPRESSION:  Interval persistence but decrease in size of the ground-glass   nodules seen in the anterior left upper lobe. Adenocarcinoma  cannot be excluded. Followup by CT is recommended in 12 months,  with continued annual surveillance for a minimum of 3 years.  These recommendations are taken from "Recommendations for the  Management of Subsolid Pulmonary Nodules Detected at CT: A  Statement from the [place]" Radiology 2013; 266:1, 304-  317.  Original Report Authenticated By: Misty Stanley, M.D.   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 Relapse since 2013 11/02/12 PR-3 880. MPO negative. - GBM neg Likely - capillaritis. 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 Admitted with hemoptysis  hemoptysis with CT scan chest findings of alveolar hemorrhage  As of June 2014 the oncologist did not feel there was any evidence of breast cancer recurrence.CT scan of the chest ruled out pulmonary embolism but showed severe bilateral groundglass opacities consistent with alveolar hemorrhage pattern. Pulmonary consultation was called. She was initially treated by stopping eliquis, providing empiric antibiotics and supported with diuresis.  Pulmonary  felt The clinical picture was felt to be consistent with diffuse alveolar hemorrhage resulting in hemoptysis. Most likely cause of alveolar hemorrhage was felt to be autoimmune based on past history of autoimmune antibody positivity and lung biopsy showing organizing pneumonia in December 2012/January 2013. Prior C.-ANCA positive though no evidence of vasculitis in Jan 2013 lung bx which is puzzling. Alternative is relapsing BOOP but the hemoptysis would be unsual unless precipitated by Eliquis. On 11/02/12 PR-3 880. Nephrology consult  On  7/29 immunosuppression therapy was initiated. She was given Pulse methylprednisolone, for three days, and her first dose of Rituxan was given on 7/30. The following days we saw significant clinical improvement in regards to hemoptysis, pulmonary infiltrates and dyspnea, She was started on  Rituxan over cyclophosphamide due to malignancy history, and also this is technically a "relapse" or A "flare". Dose starting 11/03/12 will be Induction therapy with rituximab (375 mg/m2 per week for four weeks). W/  PCP prophylaxis w/ - Bactrim 1 DS on M, W, F  Since discharge she is feeling better with resolution of hemoptysis, . Dyspnea is less and she is starting to get some of her energy back.  No fever , orthopnea or edema.  Had labs done last week. , hbg stable at 8.9 , renal fx tr down at 1.6 (max scr 2.4) . Ov with oncology last week. Rituxan  infusion given.   12/03/12- 85 yoF former smoker followed for bilateral pulmonary infiltrates/ nodules, incr C-ANCA, s/p VATS bx/ Granulomatous Polyangiitis (Wegener's) vs cryptogenic organizing pneumonia/ BOOP, Metastatic intestinal carcinoid. Complicated by Hx breast Ca 2006/ Dr Truddie Coco. AFib/ Dr Martinique, DM. Started Rituxan 375 mg/m2x 4 weeks 11/03/12. PCP prophy Bactrim 1DS on M,W,F. FOLLOWS FOR: reports doing well since ov w/ TP.  still doing well on Dulera, but needs a refill.  still taking prednisone 9m daily. 1 dose of Rituxan still pending. Had to have transfusion last week for hemoglobin 7.7. No hemoptysis since a little bit at first recognition of her granulomatous disease. She has continued prednisone 60 mg daily since hospital discharge, and we have discussed tapering the dose. No coughing, no chest pain or fever.  01/17/13- 50 yoF former smoker followed for bilateral pulmonary infiltrates/ nodules, incr C-ANCA, s/p VATS bx/ Granulomatous Polyangiitis (Wegener's) vs cryptogenic organizing pneumonia/ BOOP, Metastatic intestinal carcinoid. Complicated by Hx breast  Ca 2006/ Dr Truddie Coco. AFib/ Dr Martinique, DM. FOLLOWS FOR:  Breathing has improved since last OV needs refill on dulera Had flu vaccine Paces herself. Feels she is getting stronger. Has not needed Dulera in 2 or 3 weeks.  ROS-see HPI Constitutional:   No-   weight loss, night sweats, fevers, chills, fatigue, lassitude. HEENT:   No-  headaches, difficulty swallowing, tooth/dental problems, sore throat,       No-  sneezing, itching, ear ache, nasal congestion, post nasal drip,  CV: +chest pain, orthopnea, PND, swelling in lower extremities, anasarca,   + chronic dizziness,   No-palpitations Resp: +   shortness of breath with exertion or at rest.           No- productive cough,  No non-productive cough,               No-   change in color of mucus.  No- wheezing.   Skin: No-   rash or lesions. no- pruritus GI:  No-   heartburn, indigestion, abdominal pain, nausea, vomiting,  GU: MS:  No-   joint pain or swelling.  Neuro-     +Vertigo- uses wheelchair Psych:  No- change in mood or affect. No depression or anxiety.  No memory loss.  OBJ General- Alert, Oriented, Affect-appropriate, Distress- none acute, overweight, wheelchair Skin- rash-none, lesions- none, excoriation- none Lymphadenopathy- none Head- atraumatic            Eyes- Gross vision intact, PERRLA, conjunctivae -pale            Ears- Hearing, canals-normal            Nose- turbinate edema, no-Septal dev, mucus, polyps, erosion, perforation             Throat- Mallampati II-III , mucosa clear , drainage- none, tonsils- atrophic Neck- flexible , trachea midline, no stridor , thyroid nl, carotid no bruit Chest - symmetrical excursion , unlabored           Heart/CV-+ irregularly irregular , 2-3/6 precordial systolic murmur , no gallop  , no rub, nl s1 s2                           - JVD- none , edema+1, stasis changes- none, varices- none           Lung- clear, no wheeze , cough- none , dullness-none, rub- none           Chest wall-  healed VATS thoracotomy incision L Abd-  Br/ Gen/ Rectal- Not done, not indicated Extrem- cyanosis- none, clubbing, none, atrophy- none, strength- nl. Wheelchair for distance/ cane at home.  Neuro- grossly intact to observation     04/03/11- 80 year old female former smoking history referred courtesy of Dr. Elease Hashimoto with concern of abnormal chest CT. Husband is an allergy patient here. Dr. Erick Blinks recent office note reviewed "Followup from recent pneumonia. Patient had presented here with extreme myalgias and chronic pain along with bilateral shoulder hip pain. Very high sedimentation rate of 120.  She almost total resolution of myalgias with low dose prednisone strongly suggesting polymyalgia rheumatica. She subsequently presented with cough with trace of blood but no dyspnea, fever, reported a pain. Chest x-ray showed bilateral infiltrates suggesting probable bilateral pneumonia the recommendation for CT  scan. CT scan revealed no adenopathy and no evidence for a lung mass. Compatible with bilateral pneumonia. Patient placed on antibiotic and much better at this time with no fever and essentially no cough. Her muscle and joint pains are almost 100% resolved with low-dose prednisone. She did present back in September with severe sinus disease suggesting acute sinusitis. CT revealed no acute findings. She denies any arthralgias at this time. No skin rash. No fever." She has been on prednisone 10 mg twice daily since December 13 and feels significantly better. Morning cough has cleared with residual trace phlegm and trace blood. No chest pain. CT chest 03/23/2011 -images reviewed by me-bilateral patchy airspace disease with air bronchograms. No cavitation or definite nodularity seen. She has had right total knee replacement but no generalized arthritis. Childhood exposure to an uncle with tuberculosis. Her PPD skin test was negative. She denies history of lung disease, GERD, DVT, kidney or liver  disease. Lab- C-ANCA POS 1:320, P-ANCA NEG. RA 1:41, ANA NEG40 year old female former smoking history referred courtesy of Dr. Elease Hashimoto with concern of abnormal chest CT. Husband is an allergy patient here.  05/13/11- 19 yoF former smoker followed for bilateral pulmonary infiltrates, incr C-ANCA, s/p VATS bx/ organizing pneumonia, Metastatic intestinal carcinoid. Marland Kitchen Hx breast Ca 2006/ Dr Truddie Coco Husband here. She returns now after left lung biopsy. She has expected left thoracotomy pain but is otherwise stable as she continues maintenance prednisone. Denies blood, fever, swollen glands, discolored sputum. Exertional dyspnea is not worse, allowing for the surgery. I shared with her and her husband the available initial pathology: Patchy organizing pneumonia with chronic interstitial pneumonia and focal atypical glands. I reviewed the discussion about her at thoracic oncology conference. The parenchymal lung process is not a vasculitis as I originally questioned. I told her there was an incidental finding of cancer cells with further identification pending but that this was a separate process. We agreed to refer her to Dr. Truddie Coco who managed her breast cancer. After she had left, personal communication from Dr. Jimmy Footman- neuroendocrine tumor staining consistent with metastatic intestinal carcinoid.  06/20/11  46 yoF former smoker followed for bilateral pulmonary infiltrates, incr C-ANCA, s/p VATS bx/ organizing pneumonia, Metastatic intestinal carcinoid. Marland Kitchen Hx breast Ca 2006/ Dr Truddie Coco   Husband here She says her breathing is now "fine" back to her normal. She denies fever, cough, sputum. I spoke with Mrs. Lennartz myself and had to explain the discovery of carcinoid after her lung biopsy. We also have the note from the [facility] indicating they discussed it with her. She has absolutely no recollection. Little cough or shortness of breath now. Still a little posterior thoracotomy incision pain. CT 05/19/11-  images reviewed with them- IMPRESSION:  1. Marked improvement in bilateral air space disease seen on  comparison CT of 03/24/2011. Near complete resolution.  2. Postoperative change in the left hemithorax consistent with  recent VATS.  Original Report Authenticated By: Suzy Bouchard, M.D.   11/07/11- 66 yoF former smoker followed for bilateral pulmonary infiltrates, incr C-ANCA, s/p VATS bx/ organizing pneumonia, Metastatic intestinal carcinoid. Marland Kitchen Hx breast Ca 2006/ Dr Truddie Coco    c/o  Chronic vertigo, difficulty breathing and gets tired easy. Also some wheezing, post nasal,  and cough.  Some days she feels much better than others, no real pattern. Occasional light cough to clear throat. We had called in prednisone, which did help. Ambulatory around home, but gets out little. CT chest 09/09/11- I reviewed w/ her- there is significant patchy  bilateral lower zone fibrotic scarring and some bronchial thickening. IMPRESSION:  1. No evidence of thoracic metastatic disease or other active  process.  2. Small mild cardiomegaly and hiatal hernia.  Original Report Authenticated By: Marlaine Hind, M.D.    02/09/12-  7 yoF former smoker followed for bilateral pulmonary infiltrates, incr C-ANCA, s/p VATS bx/ cryptogenic organizing pneumonia/ BOOP, Metastatic intestinal carcinoid. Marland Kitchen Hx breast Ca 2006/ Dr Truddie Coco  January had biopsy to ck for other lung condition. SOB and wheezing at times. Left sided pain around the  Back. Continues Advair 250. Due for followup chest CT in December for Dr. Jacquiline Doe. Not on prednisone. She says breathing is unchanged. Denies pain, blood or swollen nodes.   05/11/12-  47 yoF former smoker followed for bilateral pulmonary infiltrates, incr C-ANCA, s/p VATS bx/ cryptogenic organizing pneumonia/ BOOP, Metastatic intestinal carcinoid. Complicated by Hx breast Ca 2006/ Dr Truddie Coco. AFib/ Dr Martinique, DM. She says her husband is developing dementia. Deep breath makes her left flank feel  tight where she had thoracentesis. Complains of itching since starting Eliquis.Atarax 50 mg was too strong and Benadryl did not help. Itching keeps her awake all night. No visible rash. She is convinced really unpleasant itching began when she started Eliquis. I explained this medication needs to be transitioned carefully to anything else and Dr. Martinique would need to be the one to manage that. CT chest 03/26/12 . IMPRESSION:  1. No findings to strongly suggest metastatic disease to the  thorax.  2. There is a new focus of nodular ground-glass attenuation  measuring 1 cm in the anterior aspect of the left upper lobe which  is highly nonspecific. Initial follow-up by chest CT without  contrast is recommended in 3 months to confirm persistence. This  recommendation follows the consensus statement: Recommendations for  the Management of Subsolid Pulmonary Nodules Detected at CT: A  Statement from the Fleischner Society as published in Radiology  2013; 266:304-317.  3. Atherosclerosis, including left main and three-vessel coronary  artery disease. Assessment for potential risk factor  modification, dietary therapy or pharmacologic therapy may be  warranted, if clinically indicated.  4. Slight increase in the small amount of pericardial fluid  compared to the prior examination. This is unlikely to be of  hemodynamic significance at this time.  5. Moderate cardiomegaly.  6. Additional incidental findings, similar to prior studies, as  discussed above.  Original Report Authenticated By: Vinnie Langton, M.D.   08/08/12- 15 yoF former smoker followed for bilateral pulmonary infiltrates/ nodules, incr C-ANCA, s/p VATS bx/ cryptogenic organizing pneumonia/ BOOP, Metastatic intestinal carcinoid. Complicated by Hx breast Ca 2006/ Dr Truddie Coco. AFib/ Dr Martinique, DM. Itching stopped, while continuing Eliquis. She had changed detergents and fabric softener, but cause unknown. Seasonal rhinitis symptoms now.  Total IgE 05/11/2012 was less than 1.5. New burning itch, intermittent, right lateral ribs x2 weeks. Has had shingles shot. No visible rash. CT chest 08/02/12 IMPRESSION:  Interval persistence but decrease in size of the ground-glass  nodules seen in the anterior left upper lobe. Adenocarcinoma  cannot be excluded. Followup by CT is recommended in 12 months,  with continued annual surveillance for a minimum of 3 years.  These recommendations are taken from "Recommendations for the  Management of Subsolid Pulmonary Nodules Detected at CT: A  Statement from the [place]" Radiology 2013; 266:1, 304-  317.  Original Report Authenticated By: Misty Stanley, M.D.   11/19/2012 [facility] follow up  Patient presents for a post hospital  followup Admitted 11/01/2012 - 11/08/2012 for  Granulomatous Polyangitis (former Wegener). with Alveolar Hemorrhage. Relapse since 2013 11/02/12 PR-3 880. MPO negative. - GBM neg Likely - capillaritis. 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 Admitted with hemoptysis  hemoptysis with CT scan chest findings of alveolar hemorrhage  As of June 2014 the oncologist did not feel there was any evidence of breast cancer recurrence.CT scan of the chest ruled out pulmonary embolism but showed severe bilateral groundglass opacities consistent with alveolar hemorrhage pattern. Pulmonary consultation was called. She was initially treated by stopping eliquis, providing empiric antibiotics and supported with diuresis.  Pulmonary  felt The clinical picture was felt to be consistent with diffuse alveolar hemorrhage resulting in hemoptysis. Most likely cause of alveolar hemorrhage was felt to be autoimmune based on past history of autoimmune antibody positivity and lung biopsy showing organizing pneumonia in December 2012/January 2013.  Prior C.-ANCA positive though no evidence of vasculitis in Jan 2013 lung bx which is puzzling. Alternative is relapsing BOOP but the hemoptysis would be unsual unless precipitated by Eliquis. On 11/02/12 PR-3 880. Nephrology consult  On 7/29 immunosuppression therapy was initiated. She was given Pulse methylprednisolone, for three days, and her first dose of Rituxan was given on 7/30. The following days we saw significant clinical improvement in regards to hemoptysis, pulmonary infiltrates and dyspnea, She was started on  Rituxan over cyclophosphamide due to malignancy history, and also this is technically a "relapse" or A "flare". Dose starting 11/03/12 will be Induction therapy with rituximab (375 mg/m2 per week for four weeks). W/  PCP prophylaxis w/ - Bactrim 1 DS on M, W, F  Since discharge she is feeling better with resolution of hemoptysis, . Dyspnea is less and she is starting to get some of her energy back.  No fever , orthopnea or edema.  Had labs done last week. , hbg stable at 8.9 , renal fx tr down at 1.6 (max scr 2.4) . Ov with oncology last week. Rituxan  infusion given.   12/03/12- 89 yoF former smoker followed for bilateral pulmonary infiltrates/ nodules, incr C-ANCA, s/p VATS bx/ Granulomatous Polyangiitis (Wegener's) vs cryptogenic organizing pneumonia/ BOOP, Metastatic intestinal carcinoid. Complicated by Hx breast Ca 2006/ Dr Truddie Coco. AFib/ Dr [place], DM. Started Rituxan 375 mg/m2x 4 weeks 11/03/12. PCP prophy Bactrim 1DS on M,W,F. FOLLOWS FOR: reports doing well since ov w/ TP.  still doing well on Dulera, but needs a refill.  still taking prednisone 25m daily. 1 dose of Rituxan still pending. Had to have transfusion last week for hemoglobin 7.7. No hemoptysis since a little bit at first recognition of her granulomatous disease. She has continued prednisone 60 mg daily since hospital discharge, and we have discussed tapering the dose. No coughing, no chest pain or fever.  01/17/13- 89 yoF former smoker followed for bilateral pulmonary infiltrates/ nodules, incr C-ANCA, s/p VATS bx/ Granulomatous Polyangiitis (Wegener's) vs cryptogenic organizing pneumonia/ BOOP, Metastatic intestinal carcinoid. Complicated by Hx breast Ca 2006/ Dr RTruddie Coco AFib/ Dr JMartinique DM. FOLLOWS FOR:  Breathing has improved since last OV needs refill on dulera Had flu vaccine Paces herself. Feels she is getting stronger. Has not needed Dulera in 2 or 3 weeks.  04/22/14- 75yoF former smoker followed for bilateral pulmonary infiltrates/ nodules, incr C-ANCA, s/p VATS bx/ Granulomatous Polyangiitis (Wegener's) vs cryptogenic organizing pneumonia/ BOOP, Metastatic intestinal  carcinoid. Complicated by Hx breast Ca 2006/ Dr Truddie Coco. AFib/ Dr [place], DM. FOLLOWS FOR: recently in hospital; bronchitis Hospital 1/7-1/12/15- acute bronchitis/respiratory failure/probably viral. There may have been fluid overload, she feels better now on daily Lasix. Prednisone now at 1/2x20 mg daily. CT chest 04/14/13 IMPRESSION:  1. Stable cardiac enlargement and coronary artery calcifications.  2. Patchy ground-glass opacity in both lungs with vague nodularity.  Findings suggest a partial airspace filling process such as edema,  hemorrhage or inflammatory or infectious alveolitis. No focal  airspace consolidation.  3. Possible new cystic lesion in the pancreatic body/tail junction  region. Please see above discussion.  Electronically Signed  By: Kalman Jewels M.D.  On: 04/14/2013 10:56  05/27/13- 73 yoF former smoker followed for bilateral pulmonary infiltrates/ nodules, incr C-ANCA, s/p VATS bx/ Granulomatous Polyangiitis (Wegener's) vs cryptogenic organizing pneumonia/ BOOP, Metastatic intestinal carcinoid. Complicated by Hx breast Ca 2006/ Dr Truddie Coco. AFib/ Dr [place], DM. FOLLOWS FOR:  Breathing doing well-- no concerns today Minimal dry cough. Not needing Dulera or O2. Neb BID helps. CXR 05/09/13 1V FINDINGS:  Marked  cardiopericardial enlargement. Unchanged tortuosity of the  aorta. The lungs are hyperinflated chronically. There is pulmonary  venous congestion without edema or effusion. No pneumothorax. No  asymmetric opacity.  IMPRESSION:  Pulmonary venous congestion without edema or effusion.  Electronically Signed  By: Jorje Guild M.D.  On: 05/09/2013 05:54 ECHO 04/14/13 Study Conclusions - Left ventricle: The cavity size was normal. Wall thickness was increased in a pattern of mild LVH. Systolic function was normal. The estimated ejection fraction was in the range of 60% to 65%. - Mitral valve: Mild to moderate regurgitation. - Left atrium: The atrium was moderately to severely dilated. - Right ventricle: The cavity size was mildly to moderately dilated. Wall thickness was normal. Systolic function was mildly reduced. - Right atrium: The atrium was severely dilated. - Tricuspid valve: Moderate regurgitation. - Pulmonary arteries: PA peak pressure: 85m Hg (S). Transthoracic echocardiography. M-mode, complete 2D, spectral Doppler, and color Doppler. Height: Height: 166.4cm. Height: 65.5in. Weight: Weight: 94.3kg. Weight: 207.5lb. Body mass index: BMI: 34.1kg/m^2. Body surface area: BSA: 2.12m. Blood pressure: 145/72. Patient status: Inpatient. Location: Bedside.  07/19/13- [age]oF former smoker followed for bilateral pulmonary infiltrates/ nodules, incr C-ANCA, s/p VATS bx/ Granulomatous Polyangiitis (Wegener's) vs cryptogenic organizing pneumonia/ BOOP, Metastatic intestinal carcinoid. Complicated by Hx breast Ca 2006/ Dr RuTruddie CocoAFib/ Dr JoMartiniqueDM. FOLLOWS FOR: had to use nebulizer treatment due to sitting outside with increased pollen; feels better now and no new complaints Pollen caused cough and watery nose. Nebulizer helps with occasional use. Continues maintenance prednisone 1/2 x 20 mg daily.  [facility]  [facility] .a post hospital follow up .  She was  admitted with community-acquired pneumonia versus acute bronchitis. Chest x-ray showed asymmetrical airspace disease superimposed on chronic lung changes. She was treated with aggressive IV antibiotics, and discharged on a course of Levaquin .she was discharged on oxygen therapy. Says still weak from hospital stay.  Has ov with PCP today for labs.  Finished abx earlier today. She denies any hemoptysis, orthopnea, PND, or leg Swelling .   ROS-see HPI Constitutional:   No-   weight loss, night sweats, fevers, chills, +fatigue, lassitude. HEENT:   No-  headaches, difficulty swallowing, tooth/dental problems, sore throat,       No-  sneezing, itching, ear ache, nasal congestion, post nasal drip,  CV: No -chest pain, orthopnea, PND, swelling in lower extremities, anasarca,  No-palpitations Resp: +   shortness of breath with exertion or at rest.           No- productive cough,  + non-productive cough,               No-   change in color of mucus.  No- wheezing.   Skin: No-   rash or lesions. no- pruritus GI:  No-   heartburn, indigestion, abdominal pain, nausea, vomiting,  GU: MS:  No-   joint pain or swelling.  Neuro-     +Vertigo- uses wheelchair Psych:  No- change in mood or affect. No depression or anxiety.  No memory loss.  OBJ General- Alert, Oriented, Affect-appropriate, Distress- none acute, overweight, wheelchair Skin- rash-none, lesions- none, excoriation- none Lymphadenopathy- none Head- atraumatic            Eyes- Gross vision intact, PERRLA, conjunctivae -pale            Ears- Hearing, canals-normal            Nose- turbinate edema, no-Septal dev, mucus, polyps, erosion, perforation             Throat- Mallampati II-III , mucosa clear , drainage- none, tonsils- atrophic Neck- flexible , trachea midline, no stridor , thyroid nl, carotid no bruit Chest - symmetrical excursion , unlabored           Heart/CV-+ irregularly irregular , 2-3/6 precordial systolic murmur , no gallop   , no rub, nl               s1 s2                           - JVD+full , edema+1, stasis changes- none, varices- none           Lung- +few crackles, no wheeze , cough+raspy , dullness-none, rub- none           Chest wall- healed VATS thoracotomy incision L Abd-  Br/ Gen/ Rectal- Not done, not indicated Extrem- cyanosis- none, clubbing, none, atrophy- none, strength- nl. Wheelchair for distance/ cane at home.  Neuro- grossly intact to observation

## 2013-09-01 NOTE — Assessment & Plan Note (Addendum)
Slow to resolve flare w/ +/- PNA  Check cxr today    Plan  Continue on current regimen .  Mucinex DM Twice daily  As needed  Cough/congestion  Fluids and rest  Advance activity as tolerated.  Continue on Oxygen 2l/m continuously  follow up Dr. Annamaria Boots  In 2 weeks and As needed   Please contact office for sooner follow up if symptoms do not improve or worsen or seek emergency care  Follow up with PCP today as planned for labs

## 2013-09-02 NOTE — Progress Notes (Signed)
Quick Note:  Called spoke with patient, advised of cxr results / recs as stated by TP. Pt verbalized her understanding and denied any questions. Pt to keep her 6.3.15 appt with CDY w/ repeat cxr. Pt reported symptoms are improved and will call the office if needed prior to appt. ______

## 2013-09-05 DIAGNOSIS — M3130 Wegener's granulomatosis without renal involvement: Secondary | ICD-10-CM | POA: Diagnosis not present

## 2013-09-05 DIAGNOSIS — J189 Pneumonia, unspecified organism: Secondary | ICD-10-CM | POA: Diagnosis not present

## 2013-09-05 DIAGNOSIS — IMO0001 Reserved for inherently not codable concepts without codable children: Secondary | ICD-10-CM | POA: Diagnosis not present

## 2013-09-05 DIAGNOSIS — J441 Chronic obstructive pulmonary disease with (acute) exacerbation: Secondary | ICD-10-CM | POA: Diagnosis not present

## 2013-09-05 DIAGNOSIS — I5032 Chronic diastolic (congestive) heart failure: Secondary | ICD-10-CM | POA: Diagnosis not present

## 2013-09-07 ENCOUNTER — Ambulatory Visit: Payer: Medicare Other | Admitting: Internal Medicine

## 2013-09-07 ENCOUNTER — Encounter: Payer: Self-pay | Admitting: Internal Medicine

## 2013-09-07 ENCOUNTER — Ambulatory Visit (INDEPENDENT_AMBULATORY_CARE_PROVIDER_SITE_OTHER)
Admission: RE | Admit: 2013-09-07 | Discharge: 2013-09-07 | Disposition: A | Discharge status: Home / Self Care | Payer: Medicare Other | Source: Ambulatory Visit | Attending: Internal Medicine | Admitting: Internal Medicine

## 2013-09-07 VITALS — BP 130/74 | HR 60 | Ht 66.0 in | Wt 197.0 lb

## 2013-09-07 DIAGNOSIS — J189 Pneumonia, unspecified organism: Secondary | ICD-10-CM

## 2013-09-07 DIAGNOSIS — J984 Other disorders of lung: Secondary | ICD-10-CM | POA: Diagnosis not present

## 2013-09-07 DIAGNOSIS — M3130 Wegener's granulomatosis without renal involvement: Secondary | ICD-10-CM

## 2013-09-07 NOTE — Progress Notes (Signed)
04/03/11- 80 year old female former smoking history referred courtesy of Dr. Elease Hashimoto with concern of abnormal chest CT. Husband is an allergy patient here. Dr. Erick Blinks recent office note reviewed "Followup from recent pneumonia. Patient had presented here with extreme myalgias and chronic pain along with bilateral shoulder hip pain. Very high sedimentation rate of 120.  She almost total resolution of myalgias with low dose prednisone strongly suggesting polymyalgia rheumatica. She subsequently presented with cough with trace of blood but no dyspnea, fever, reported a pain. Chest x-ray showed bilateral infiltrates suggesting probable bilateral pneumonia the recommendation for CT scan. CT scan revealed no adenopathy and no evidence for a lung mass. Compatible with bilateral pneumonia. Patient placed on antibiotic and much better at this time with no fever and essentially no cough. Her muscle and joint pains are almost 100% resolved with low-dose prednisone. She did present back in September with severe sinus disease suggesting acute sinusitis. CT revealed no acute findings. She denies any arthralgias at this time. No skin rash. No fever." She has been on prednisone 10 mg twice daily since December 13 and feels significantly better. Morning cough has cleared with residual trace phlegm and trace blood. No chest pain. CT chest 03/23/2011 -images reviewed by me-bilateral patchy airspace disease with air bronchograms. No cavitation or definite nodularity seen. She has had right total knee replacement but no generalized arthritis. Childhood exposure to an uncle with tuberculosis. Her PPD skin test was negative. She denies history of lung disease, GERD, DVT, kidney or liver disease. Lab- C-ANCA POS 1:320, P-ANCA NEG. RA 1:23, ANA NEG11 year old female former smoking history referred courtesy of Dr. Elease Hashimoto with concern of abnormal chest CT. Husband is an allergy patient here.  05/13/11- 52 yoF former smoker  followed for bilateral pulmonary infiltrates, incr C-ANCA, s/p VATS bx/ organizing pneumonia, Metastatic intestinal carcinoid. Marland Kitchen Hx breast Ca 2006/ Dr Truddie Coco Husband here. She returns now after left lung biopsy. She has expected left thoracotomy pain but is otherwise stable as she continues maintenance prednisone. Denies blood, fever, swollen glands, discolored sputum. Exertional dyspnea is not worse, allowing for the surgery. I shared with her and her husband the available initial pathology: Patchy organizing pneumonia with chronic interstitial pneumonia and focal atypical glands. I reviewed the discussion about her at thoracic oncology conference. The parenchymal lung process is not a vasculitis as I originally questioned. I told her there was an incidental finding of cancer cells with further identification pending but that this was a separate process. We agreed to refer her to Dr. Truddie Coco who managed her breast cancer. After she had left, personal communication from Dr. Jimmy Footman- neuroendocrine tumor staining consistent with metastatic intestinal carcinoid.  06/20/11  11 yoF former smoker followed for bilateral pulmonary infiltrates, incr C-ANCA, s/p VATS bx/ organizing pneumonia, Metastatic intestinal carcinoid. Marland Kitchen Hx breast Ca 2006/ Dr Truddie Coco   Husband here She says her breathing is now "fine" back to her normal. She denies fever, cough, sputum. I spoke with Mrs. Roehrs myself and had to explain the discovery of carcinoid after her lung biopsy. We also have the note from the [facility] indicating they discussed it with her. She has absolutely no recollection. Little cough or shortness of breath now. Still a little posterior thoracotomy incision pain. CT 05/19/11- images reviewed with them- IMPRESSION:  1. Marked improvement in bilateral air space disease seen on  comparison CT of 03/24/2011. Near complete resolution.  2. Postoperative change in the left hemithorax consistent with  recent VATS.   Original Report  Authenticated By: Suzy Bouchard, M.D.   11/07/11- 57 yoF former smoker followed for bilateral pulmonary infiltrates, incr C-ANCA, s/p VATS bx/ organizing pneumonia, Metastatic intestinal carcinoid. Marland Kitchen Hx breast Ca 2006/ Dr Truddie Coco    c/o  Chronic vertigo, difficulty breathing and gets tired easy. Also some wheezing, post nasal,  and cough.  Some days she feels much better than others, no real pattern. Occasional light cough to clear throat. We had called in prednisone, which did help. Ambulatory around home, but gets out little. CT chest 09/09/11- I reviewed w/ her- there is significant patchy bilateral lower zone fibrotic scarring and some bronchial thickening. IMPRESSION:  1. No evidence of thoracic metastatic disease or other active  process.  2. Small mild cardiomegaly and hiatal hernia.  Original Report Authenticated By: Marlaine Hind, M.D.    02/09/12-  27 yoF former smoker followed for bilateral pulmonary infiltrates, incr C-ANCA, s/p VATS bx/ cryptogenic organizing pneumonia/ BOOP, Metastatic intestinal carcinoid. Marland Kitchen Hx breast Ca 2006/ Dr Truddie Coco  January had biopsy to ck for other lung condition. SOB and wheezing at times. Left sided pain around the  Back. Continues Advair 250. Due for followup chest CT in December for Dr. Jacquiline Doe. Not on prednisone. She says breathing is unchanged. Denies pain, blood or swollen nodes.   05/11/12-  89 yoF former smoker followed for bilateral pulmonary infiltrates, incr C-ANCA, s/p VATS bx/ cryptogenic organizing pneumonia/ BOOP, Metastatic intestinal carcinoid. Complicated by Hx breast Ca 2006/ Dr Truddie Coco. AFib/ Dr Martinique, DM. She says her husband is developing dementia. Deep breath makes her left flank feel tight where she had thoracentesis. Complains of itching since starting Eliquis.Atarax 50 mg was too strong and Benadryl did not help. Itching keeps her awake all night. No visible rash. She is convinced really unpleasant itching began when  she started Eliquis. I explained this medication needs to be transitioned carefully to anything else and Dr. Martinique would need to be the one to manage that. CT chest 03/26/12 . IMPRESSION:  1. No findings to strongly suggest metastatic disease to the  thorax.  2. There is a new focus of nodular ground-glass attenuation  measuring 1 cm in the anterior aspect of the left upper lobe which  is highly nonspecific. Initial follow-up by chest CT without  contrast is recommended in 3 months to confirm persistence. This  recommendation follows the consensus statement: Recommendations for  the Management of Subsolid Pulmonary Nodules Detected at CT: A  Statement from the [employer] Society as published in Radiology  2013; 266:304-317.  3. Atherosclerosis, including left main and three-vessel coronary  artery disease. Assessment for potential risk factor  modification, dietary therapy or pharmacologic therapy may be  warranted, if clinically indicated.  4. Slight increase in the small amount of pericardial fluid  compared to the prior examination. This is unlikely to be of  hemodynamic significance at this time.  5. Moderate cardiomegaly.  6. Additional incidental findings, similar to prior studies, as  discussed above.  Original Report Authenticated By: Vinnie Langton, M.D.   08/08/12- 15 yoF former smoker followed for bilateral pulmonary infiltrates/ nodules, incr C-ANCA, s/p VATS bx/ cryptogenic organizing pneumonia/ BOOP, Metastatic intestinal carcinoid. Complicated by Hx breast Ca 2006/ Dr Truddie Coco. AFib/ Dr Martinique, DM. Itching stopped, while continuing Eliquis. She had changed detergents and fabric softener, but cause unknown. Seasonal rhinitis symptoms now. Total IgE 05/11/2012 was less than 1.5. New burning itch, intermittent, right lateral ribs x2 weeks. Has had shingles shot. No visible rash. CT chest  08/02/12 IMPRESSION:  Interval persistence but decrease in size of the ground-glass   nodules seen in the anterior left upper lobe. Adenocarcinoma  cannot be excluded. Followup by CT is recommended in 12 months,  with continued annual surveillance for a minimum of 3 years.  These recommendations are taken from "Recommendations for the  Management of Subsolid Pulmonary Nodules Detected at CT: A  Statement from the [employer]" Radiology 2013; 266:1, 304-  317.  Original Report Authenticated By: Misty Stanley, M.D.   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 Relapse since 2013 11/02/12 PR-3 880. MPO negative. - GBM neg Likely - capillaritis. 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 Admitted with hemoptysis  hemoptysis with CT scan chest findings of alveolar hemorrhage  As of June 2014 the oncologist did not feel there was any evidence of breast cancer recurrence.CT scan of the chest ruled out pulmonary embolism but showed severe bilateral groundglass opacities consistent with alveolar hemorrhage pattern. Pulmonary consultation was called. She was initially treated by stopping eliquis, providing empiric antibiotics and supported with diuresis.  Pulmonary  felt The clinical picture was felt to be consistent with diffuse alveolar hemorrhage resulting in hemoptysis. Most likely cause of alveolar hemorrhage was felt to be autoimmune based on past history of autoimmune antibody positivity and lung biopsy showing organizing pneumonia in December 2012/January 2013. Prior C.-ANCA positive though no evidence of vasculitis in Jan 2013 lung bx which is puzzling. Alternative is relapsing BOOP but the hemoptysis would be unsual unless precipitated by Eliquis. On 11/02/12 PR-3 880. Nephrology consult  On  7/29 immunosuppression therapy was initiated. She was given Pulse methylprednisolone, for three days, and her first dose of Rituxan was given on 7/30. The following days we saw significant clinical improvement in regards to hemoptysis, pulmonary infiltrates and dyspnea, She was started on  Rituxan over cyclophosphamide due to malignancy history, and also this is technically a "relapse" or A "flare". Dose starting 11/03/12 will be Induction therapy with rituximab (375 mg/m2 per week for four weeks). W/  PCP prophylaxis w/ - Bactrim 1 DS on M, W, F  Since discharge she is feeling better with resolution of hemoptysis, . Dyspnea is less and she is starting to get some of her energy back.  No fever , orthopnea or edema.  Had labs done last week. , hbg stable at 8.9 , renal fx tr down at 1.6 (max scr 2.4) . Ov with oncology last week. Rituxan  infusion given.   12/03/12- 85 yoF former smoker followed for bilateral pulmonary infiltrates/ nodules, incr C-ANCA, s/p VATS bx/ Granulomatous Polyangiitis (Wegener's) vs cryptogenic organizing pneumonia/ BOOP, Metastatic intestinal carcinoid. Complicated by Hx breast Ca 2006/ Dr Truddie Coco. AFib/ Dr Martinique, DM. Started Rituxan 375 mg/m2x 4 weeks 11/03/12. PCP prophy Bactrim 1DS on M,W,F. FOLLOWS FOR: reports doing well since ov w/ TP.  still doing well on Dulera, but needs a refill.  still taking prednisone 9m daily. 1 dose of Rituxan still pending. Had to have transfusion last week for hemoglobin 7.7. No hemoptysis since a little bit at first recognition of her granulomatous disease. She has continued prednisone 60 mg daily since hospital discharge, and we have discussed tapering the dose. No coughing, no chest pain or fever.  01/17/13- 84 yoF former smoker followed for bilateral pulmonary infiltrates/ nodules, incr C-ANCA, s/p VATS bx/ Granulomatous Polyangiitis (Wegener's) vs cryptogenic organizing pneumonia/ BOOP, Metastatic intestinal carcinoid. Complicated by Hx breast  Ca 2006/ Dr Truddie Coco. AFib/ Dr Martinique, DM. FOLLOWS FOR:  Breathing has improved since last OV needs refill on dulera Had flu vaccine Paces herself. Feels she is getting stronger. Has not needed Dulera in 2 or 3 weeks.  04/22/14- 42 yoF former smoker followed for bilateral pulmonary infiltrates/ nodules, incr C-ANCA, s/p VATS bx/ Granulomatous Polyangiitis (Wegener's) vs cryptogenic organizing pneumonia/ BOOP, Metastatic intestinal carcinoid. Complicated by Hx breast Ca 2006/ Dr Truddie Coco. AFib/ Dr Martinique, DM. FOLLOWS FOR: recently in hospital; bronchitis Hospital 1/7-1/12/15- acute bronchitis/respiratory failure/probably viral. There may have been fluid overload, she feels better now on daily Lasix. Prednisone now at 1/2x20 mg daily. CT chest 04/14/13 IMPRESSION:  1. Stable cardiac enlargement and coronary artery calcifications.  2. Patchy ground-glass opacity in both lungs with vague nodularity.  Findings suggest a partial airspace filling process such as edema,  hemorrhage or inflammatory or infectious alveolitis. No focal  airspace consolidation.  3. Possible new cystic lesion in the pancreatic body/tail junction  region. Please see above discussion.  Electronically Signed  By: Kalman Jewels M.D.  On: 04/14/2013 10:56  05/27/13- 72 yoF former smoker followed for bilateral pulmonary infiltrates/ nodules, incr C-ANCA, s/p VATS bx/ Granulomatous Polyangiitis (Wegener's) vs cryptogenic organizing pneumonia/ BOOP, Metastatic intestinal carcinoid. Complicated by Hx breast Ca 2006/ Dr Truddie Coco. AFib/ Dr Martinique, DM. FOLLOWS FOR:  Breathing doing well-- no concerns today Minimal dry cough. Not needing Dulera or O2. Neb BID helps. CXR 05/09/13 1V FINDINGS:  Marked cardiopericardial enlargement. Unchanged tortuosity of the  aorta. The lungs are hyperinflated chronically. There is pulmonary  venous congestion without edema or effusion. No pneumothorax. No  asymmetric opacity.  IMPRESSION:  Pulmonary venous  congestion without edema or effusion.  Electronically Signed  By: Jorje Guild M.D.  On: 05/09/2013 05:54 ECHO 04/14/13 Study Conclusions - Left ventricle: The cavity size was normal. Wall thickness was increased in a pattern of mild LVH. Systolic function was normal. The estimated ejection fraction was in the range of 60% to 65%. - Mitral valve: Mild to moderate regurgitation. - Left atrium: The atrium was moderately to severely dilated. - Right ventricle: The cavity size was mildly to moderately dilated. Wall thickness was normal. Systolic function was mildly reduced. - Right atrium: The atrium was severely dilated. - Tricuspid valve: Moderate regurgitation. - Pulmonary arteries: PA peak pressure: 49m Hg (S). Transthoracic echocardiography. M-mode, complete 2D, spectral Doppler, and color Doppler. Height: Height: 166.4cm. Height: 65.5in. Weight: Weight: 94.3kg. Weight: 207.5lb. Body mass index: BMI: 34.1kg/m^2. Body surface area: BSA: 2.160m. Blood pressure: 145/72. Patient status: Inpatient. Location: Bedside.  07/19/13- [age]oF former smoker followed for bilateral pulmonary infiltrates/ nodules, incr C-ANCA, s/p VATS bx/ Granulomatous Polyangiitis (Wegener's) vs cryptogenic organizing pneumonia/ BOOP, Metastatic intestinal carcinoid. Complicated by Hx breast Ca 2006/ Dr RuTruddie CocoAFib/ Dr JoMartiniqueDM. FOLLOWS FOR: had to use nebulizer treatment due to sitting outside with increased pollen; feels better now and no new complaints Pollen caused cough and watery nose. Nebulizer helps with occasional use. Continues maintenance prednisone 1/2 x 20 mg daily.  [facility]  [facility] for .a post [facility] follow up .  She was admitted with community-acquired pneumonia versus acute bronchitis. Chest x-ray showed asymmetrical airspace disease superimposed on chronic lung changes. She was treated with aggressive IV antibiotics, and discharged on a course  of Levaquin .she was  discharged on oxygen therapy. Says still weak from hospital stay.  Has ov with PCP today for labs.  Finished abx earlier today. She denies any hemoptysis, orthopnea, PND, or leg Swelling .   09/07/13- 81 yoF former smoker followed for bilateral pulmonary infiltrates/ nodules, incr C-ANCA, s/p VATS bx/ Granulomatous Polyangiitis (Wegener's) vs cryptogenic organizing pneumonia/ BOOP, Metastatic intestinal carcinoid. Complicated by Hx breast Ca 2006/ Dr Truddie Coco. AFib/ PHTN Dr Martinique, DM FOLLOWS FOR: pt would like to come off O2-was d/c'd home with it and does not like it. Pt states she feels much better from 2 weeks ago. CXR 09/07/13 FINDINGS:  Underlying chronic interstitial disease is stable. There is no frank  edema or airspace consolidation currently. There has been apparent  interval clearing of subtle opacities on the right, which were felt  to represent infiltrate superimposed on chronic disease.  Heart is enlarged with pulmonary vascularity within normal limits.  No adenopathy. There is evidence of an old fracture of the left  posterior sixth rib, stable.  IMPRESSION:  Stable chronic interstitial disease and cardiomegaly. No  superimposed edema or consolidation is appreciable currently. No new  opacities appreciable compared to most recent prior study.  Electronically Signed  By: Lowella Grip M.D.  On: 09/07/2013 09:24  ROS-see HPI Constitutional:   No-   weight loss, night sweats, fevers, chills, +fatigue, lassitude. HEENT:   No-  headaches, difficulty swallowing, tooth/dental problems, sore throat,       No-  sneezing, itching, ear ache, nasal congestion, post nasal drip,  CV: No -chest pain, orthopnea, PND, swelling in lower extremities, anasarca,    No-palpitations Resp: +   shortness of breath with exertion or at rest.           No- productive cough,  + non-productive cough,               No-   change in color of mucus.  No- wheezing.   Skin: No-   rash or lesions. no-  pruritus GI:  No-   heartburn, indigestion, abdominal pain, nausea, vomiting,  GU: MS:  No-   joint pain or swelling.  Neuro-     +Vertigo- uses wheelchair Psych:  No- change in mood or affect. No depression or anxiety.  No memory loss.  OBJ General- Alert, Oriented, Affect-appropriate, Distress- none acute, overweight, wheelchair Skin- rash-none, lesions- none, excoriation- none Lymphadenopathy- none Head- atraumatic            Eyes- Gross vision intact, PERRLA, conjunctivae -pale            Ears- Hearing, canals-normal            Nose- turbinate edema, no-Septal dev, mucus, polyps, erosion, perforation             Throat- Mallampati II-III , mucosa clear , drainage- none, tonsils- atrophic Neck- flexible , trachea midline, no stridor , thyroid nl, carotid no bruit Chest - symmetrical excursion , unlabored           Heart/CV-+ irregularly irregular/ AFib , 2-3/6 precordial systolic murmur , no gallop  , no rub,                             Nl s1 s2                           - JVD+full , edema-none, stasis changes-  none, varices- none           Lung- +few crackles, no wheeze , cough+raspy , dullness-none, rub- none           Chest wall- healed VATS thoracotomy incision L Abd-  Br/ Gen/ Rectal- Not done, not indicated Extrem- cyanosis- none, clubbing, none, atrophy- none, strength- nl. Wheelchair for distance/             cane at home.  Neuro- grossly intact to observation

## 2013-09-07 NOTE — Patient Instructions (Addendum)
Try using otc antihistamine like Claritin/ loratadine once daily for itching  Ok to use your oxygen 2L just for sleep and if needed during the day. Your resting room air saturation today was 96-98%, which is good.  Ok not to take Zofran/ odansetron, which would have been prescribed for nausea, since you don't need it.

## 2013-09-09 DIAGNOSIS — M3130 Wegener's granulomatosis without renal involvement: Secondary | ICD-10-CM | POA: Diagnosis not present

## 2013-09-09 DIAGNOSIS — J189 Pneumonia, unspecified organism: Secondary | ICD-10-CM | POA: Diagnosis not present

## 2013-09-09 DIAGNOSIS — IMO0001 Reserved for inherently not codable concepts without codable children: Secondary | ICD-10-CM | POA: Diagnosis not present

## 2013-09-09 DIAGNOSIS — J441 Chronic obstructive pulmonary disease with (acute) exacerbation: Secondary | ICD-10-CM | POA: Diagnosis not present

## 2013-09-09 DIAGNOSIS — I5032 Chronic diastolic (congestive) heart failure: Secondary | ICD-10-CM | POA: Diagnosis not present

## 2013-09-12 ENCOUNTER — Other Ambulatory Visit: Payer: Medicare Other

## 2013-09-12 ENCOUNTER — Ambulatory Visit (HOSPITAL_COMMUNITY): Payer: Medicare Other

## 2013-09-12 DIAGNOSIS — H43399 Other vitreous opacities, unspecified eye: Secondary | ICD-10-CM | POA: Diagnosis not present

## 2013-09-12 DIAGNOSIS — H2510 Age-related nuclear cataract, unspecified eye: Secondary | ICD-10-CM | POA: Diagnosis not present

## 2013-09-12 DIAGNOSIS — H1130 Conjunctival hemorrhage, unspecified eye: Secondary | ICD-10-CM | POA: Diagnosis not present

## 2013-09-13 DIAGNOSIS — I5032 Chronic diastolic (congestive) heart failure: Secondary | ICD-10-CM | POA: Diagnosis not present

## 2013-09-13 DIAGNOSIS — J189 Pneumonia, unspecified organism: Secondary | ICD-10-CM | POA: Diagnosis not present

## 2013-09-13 DIAGNOSIS — IMO0001 Reserved for inherently not codable concepts without codable children: Secondary | ICD-10-CM | POA: Diagnosis not present

## 2013-09-13 DIAGNOSIS — J441 Chronic obstructive pulmonary disease with (acute) exacerbation: Secondary | ICD-10-CM | POA: Diagnosis not present

## 2013-09-13 DIAGNOSIS — M3130 Wegener's granulomatosis without renal involvement: Secondary | ICD-10-CM | POA: Diagnosis not present

## 2013-09-14 ENCOUNTER — Ambulatory Visit: Payer: Medicare Other | Admitting: Internal Medicine

## 2013-09-15 ENCOUNTER — Other Ambulatory Visit: Payer: Medicare Other | Admitting: Lab

## 2013-09-15 ENCOUNTER — Ambulatory Visit: Payer: Medicare Other | Admitting: Oncology

## 2013-09-15 DIAGNOSIS — IMO0001 Reserved for inherently not codable concepts without codable children: Secondary | ICD-10-CM | POA: Diagnosis not present

## 2013-09-15 DIAGNOSIS — J441 Chronic obstructive pulmonary disease with (acute) exacerbation: Secondary | ICD-10-CM | POA: Diagnosis not present

## 2013-09-15 DIAGNOSIS — M3130 Wegener's granulomatosis without renal involvement: Secondary | ICD-10-CM | POA: Diagnosis not present

## 2013-09-15 DIAGNOSIS — I5032 Chronic diastolic (congestive) heart failure: Secondary | ICD-10-CM | POA: Diagnosis not present

## 2013-09-15 DIAGNOSIS — J189 Pneumonia, unspecified organism: Secondary | ICD-10-CM | POA: Diagnosis not present

## 2013-09-16 ENCOUNTER — Encounter: Payer: Self-pay | Admitting: Family Medicine

## 2013-09-16 ENCOUNTER — Ambulatory Visit (INDEPENDENT_AMBULATORY_CARE_PROVIDER_SITE_OTHER): Payer: Medicare Other | Admitting: Family Medicine

## 2013-09-16 VITALS — BP 124/70 | HR 73 | Temp 98.6°F

## 2013-09-16 DIAGNOSIS — I10 Essential (primary) hypertension: Secondary | ICD-10-CM

## 2013-09-16 DIAGNOSIS — E119 Type 2 diabetes mellitus without complications: Secondary | ICD-10-CM | POA: Diagnosis not present

## 2013-09-16 DIAGNOSIS — E871 Hypo-osmolality and hyponatremia: Secondary | ICD-10-CM | POA: Diagnosis not present

## 2013-09-16 LAB — BASIC METABOLIC PANEL
BUN: 22 mg/dL (ref 6–23)
CALCIUM: 9 mg/dL (ref 8.4–10.5)
CHLORIDE: 92 meq/L — AB (ref 96–112)
CO2: 29 mEq/L (ref 19–32)
Creatinine, Ser: 1.8 mg/dL — ABNORMAL HIGH (ref 0.4–1.2)
GFR: 29.8 mL/min — ABNORMAL LOW (ref >60.00)
Glucose, Bld: 92 mg/dL (ref 70–99)
Potassium: 4.6 mEq/L (ref 3.5–5.1)
Sodium: 127 mEq/L — ABNORMAL LOW (ref 135–145)

## 2013-09-16 LAB — HEMOGLOBIN A1C: HEMOGLOBIN A1C: 6.4 % (ref 4.6–6.5)

## 2013-09-16 NOTE — Progress Notes (Signed)
Pre visit review using our clinic review tool, if applicable. No additional management support is needed unless otherwise documented below in the visit note.

## 2013-09-16 NOTE — Progress Notes (Signed)
Subjective:    Patient ID: [name] [name], female    DOB: [birth year], 80 y.o.   MRN: [id number]  HPI Medical followup. She had recent pneumonia and overall feels much better today. She has recovered and continues to have her prednisone tapered down with history of Wegener's granulomatosis. She has recently discontinued daytime oxygen has questions whether she can stop her nighttime oxygen.  Last A1c 7.0%. No hypoglycemia. Blood sugars been very stable when she does check these at home.  Chronic hyponatremia. She's not taking thiazide diuretics. She has very restricted diet regarding sodium. No recent edema issues.  Past Medical History  Diagnosis Date  . HYPOTHYROIDISM 07/24/2008  . HYPERLIPIDEMIA 07/24/2008  . DEPRESSION 05/16/2009  . HYPERTENSION 07/24/2008  . Vertigo   . Diabetes mellitus type II   . Arthritis     r tkr  . CVA (cerebral vascular accident) 2008    mini stroke.no residual  . Heart murmur     stress test 2009.dr peter Martinique  . Intermittent vertigo   . Skin abnormality     facial lesions .pt applying mupiracin to areas  . Atrial fibrillation   . Breast cancer   . Pneumonia   . 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 Knee surgery  2009    TKR  . Cholecystectomy  1980  . Joint replacement      r knee  . Breast surgery  2007    Lumpectomy, XRT 2006.l breast  . Video assisted thoracoscopy  04/22/2011    Procedure: VIDEO ASSISTED THORACOSCOPY;  Surgeon: Melrose Nakayama, MD;  Location: [facility];  Service: Thoracic;  Laterality: Left;  WITH BIOPSY  . Heel spur surgery Right   . Exploratory laparotomy      reports that she quit smoking about 31 years ago. Her smoking use included Cigarettes. She has a 6 pack-year smoking history. She has never used smokeless tobacco. She reports that she does not drink alcohol or use illicit drugs. family history includes Arthritis in her mother; Breast cancer in her sister;  Cancer in her sister; Coronary artery disease in her brother and father; Heart disease in her father; Lung cancer in her brother; Rheum arthritis in her mother. Allergies  Allergen Reactions  . Codeine Sulfate Other (See Comments)    REACTION: GI upset  . Sulfonamide Derivatives Other (See Comments)    REACTION: GI upset  . Atarax [Hydroxyzine] Nausea Only and Rash      Review of Systems  Constitutional: Negative for fatigue and unexpected weight change.  Eyes: Negative for visual disturbance.  Respiratory: Negative for cough, chest tightness and wheezing.   Cardiovascular: Negative for chest pain, palpitations and leg swelling.  Endocrine: Negative for polydipsia and polyuria.  Neurological: Negative for dizziness, seizures, syncope, weakness, light-headedness and headaches.  All other systems reviewed and are negative.      Objective:   Physical Exam  Constitutional: She is oriented to person, place, and time. She appears well-developed and well-nourished.  HENT:  Mouth/Throat: Oropharynx is clear and moist.  Neck: Neck supple. No thyromegaly present.  Cardiovascular: Normal rate.   Pulmonary/Chest: Effort normal. No respiratory distress. She has no wheezes.  Musculoskeletal: She exhibits no edema.  Neurological: She is alert and oriented to person, place, and time.          Assessment & Plan:  #1 type 2 diabetes. History of good control. Recheck A1c. We expect this to continue to  improve as her prednisone is tapered. #2 hypertension which is stable at goal. Continue current medications #3 hyponatremia which is chronic. Recheck basic metabolic panel #4 history of Wegener's granulomatosis. Patient is currently using nighttime oxygen. She has questions regarding nighttime use. Check overnight pulse oximetry.

## 2013-09-19 DIAGNOSIS — M3130 Wegener's granulomatosis without renal involvement: Secondary | ICD-10-CM | POA: Diagnosis not present

## 2013-09-19 DIAGNOSIS — I5032 Chronic diastolic (congestive) heart failure: Secondary | ICD-10-CM | POA: Diagnosis not present

## 2013-09-19 DIAGNOSIS — J441 Chronic obstructive pulmonary disease with (acute) exacerbation: Secondary | ICD-10-CM | POA: Diagnosis not present

## 2013-09-19 DIAGNOSIS — IMO0001 Reserved for inherently not codable concepts without codable children: Secondary | ICD-10-CM | POA: Diagnosis not present

## 2013-09-19 DIAGNOSIS — J189 Pneumonia, unspecified organism: Secondary | ICD-10-CM | POA: Diagnosis not present

## 2013-09-21 DIAGNOSIS — J441 Chronic obstructive pulmonary disease with (acute) exacerbation: Secondary | ICD-10-CM | POA: Diagnosis not present

## 2013-09-21 DIAGNOSIS — IMO0001 Reserved for inherently not codable concepts without codable children: Secondary | ICD-10-CM | POA: Diagnosis not present

## 2013-09-21 DIAGNOSIS — I5032 Chronic diastolic (congestive) heart failure: Secondary | ICD-10-CM | POA: Diagnosis not present

## 2013-09-21 DIAGNOSIS — J189 Pneumonia, unspecified organism: Secondary | ICD-10-CM | POA: Diagnosis not present

## 2013-09-21 DIAGNOSIS — M3130 Wegener's granulomatosis without renal involvement: Secondary | ICD-10-CM | POA: Diagnosis not present

## 2013-09-22 ENCOUNTER — Other Ambulatory Visit: Payer: Self-pay | Admitting: Physician Assistant

## 2013-09-26 ENCOUNTER — Telehealth: Payer: Self-pay | Admitting: Family Medicine

## 2013-09-26 ENCOUNTER — Ambulatory Visit (INDEPENDENT_AMBULATORY_CARE_PROVIDER_SITE_OTHER): Payer: Medicare Other | Admitting: Family Medicine

## 2013-09-26 ENCOUNTER — Encounter: Payer: Self-pay | Admitting: Family Medicine

## 2013-09-26 VITALS — BP 130/70 | HR 62 | Temp 98.6°F

## 2013-09-26 DIAGNOSIS — E1121 Type 2 diabetes mellitus with diabetic nephropathy: Secondary | ICD-10-CM

## 2013-09-26 DIAGNOSIS — N058 Unspecified nephritic syndrome with other morphologic changes: Secondary | ICD-10-CM | POA: Diagnosis not present

## 2013-09-26 DIAGNOSIS — E1129 Type 2 diabetes mellitus with other diabetic kidney complication: Secondary | ICD-10-CM

## 2013-09-26 DIAGNOSIS — J441 Chronic obstructive pulmonary disease with (acute) exacerbation: Secondary | ICD-10-CM | POA: Diagnosis not present

## 2013-09-26 DIAGNOSIS — M3130 Wegener's granulomatosis without renal involvement: Secondary | ICD-10-CM

## 2013-09-26 MED ORDER — INSULIN GLARGINE 100 UNIT/ML SOLOSTAR PEN: 10.0000 [IU] | PEN_INJECTOR | Freq: Every day | SUBCUTANEOUS | Status: DC

## 2013-09-26 MED ORDER — LEVOFLOXACIN 500 MG PO TABS: 500.0000 mg | ORAL_TABLET | Freq: Every day | ORAL | Status: DC

## 2013-09-26 MED ORDER — PREDNISONE 20 MG PO TABS: ORAL_TABLET | ORAL | Status: DC

## 2013-09-26 NOTE — Telephone Encounter (Signed)
Faxed the Rx to [facility]

## 2013-09-26 NOTE — Progress Notes (Signed)
Subjective:    Patient ID: [name] [name], female    DOB: 12-Feb-1933, 80 y.o.   MRN: [id number]  Cough Associated symptoms include postnasal drip and wheezing. Pertinent negatives include no chest pain, chills or fever.   Patient has history of COPD and Wegener's granulomatosis. She is on chronic low-dose prednisone. She is seen today for acute issue of exacerbation. She's had about one to 2 days of increased cough and wheezing. Minimal dyspnea. She is wearing her oxygen relatively continuously. Currently takes prednisone 5 mg alternating with 10 mg. No fever. Sputum mostly thick white but occasionally green to yellow. Denies any nausea or vomiting. Does have some postnasal drip symptoms. She has home nebulizer which she is using intermittently. Wheezing exacerbated by activity  Type 2 diabetes which is been well controlled. Recent A1c 6.4%.  Past Medical History  Diagnosis Date  . HYPOTHYROIDISM 07/24/2008  . HYPERLIPIDEMIA 07/24/2008  . DEPRESSION 05/16/2009  . HYPERTENSION 07/24/2008  . Vertigo   . Diabetes mellitus type II   . Arthritis     r tkr  . CVA (cerebral vascular accident) 2008    mini stroke.no residual  . Heart murmur     stress test 2009.dr peter Martinique  . Intermittent vertigo   . Skin abnormality     facial lesions .pt applying mupiracin to areas  . Atrial fibrillation   . Breast cancer   . Pneumonia   . 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 Knee surgery  2009    TKR  . Cholecystectomy  1980  . Joint replacement      r knee  . Breast surgery  2007    Lumpectomy, XRT 2006.l breast  . Video assisted thoracoscopy  04/22/2011    Procedure: VIDEO ASSISTED THORACOSCOPY;  Surgeon: Melrose Nakayama, MD;  Location: [facility];  Service: Thoracic;  Laterality: Left;  WITH BIOPSY  . Heel spur surgery Right   . Exploratory laparotomy      reports that she quit smoking about 31 years ago. Her smoking use included  Cigarettes. She has a 6 pack-year smoking history. She has never used smokeless tobacco. She reports that she does not drink alcohol or use illicit drugs. family history includes Arthritis in her mother; Breast cancer in her sister; Cancer in her sister; Coronary artery disease in her brother and father; Heart disease in her father; Lung cancer in her brother; Rheum arthritis in her mother. Allergies  Allergen Reactions  . Codeine Sulfate Other (See Comments)    REACTION: GI upset  . Sulfonamide Derivatives Other (See Comments)    REACTION: GI upset  . Atarax [Hydroxyzine] Nausea Only and Rash      Review of Systems  Constitutional: Negative for fever and chills.  HENT: Positive for congestion and postnasal drip.   Respiratory: Positive for cough and wheezing.   Cardiovascular: Negative for chest pain, palpitations and leg swelling.  Endocrine: Negative for polydipsia and polyuria.  Genitourinary: Negative for dysuria.       Objective:   Physical Exam  HENT:  Right Ear: External ear normal.  Left Ear: External ear normal.  Mouth/Throat: Oropharynx is clear and moist.  Neck: Neck supple.  Cardiovascular: Normal rate and regular rhythm.   Pulmonary/Chest: Effort normal. No respiratory distress. She has wheezes. She has no rales.  Musculoskeletal: She exhibits no edema.  Lymphadenopathy:    She has no cervical adenopathy.  Assessment & Plan:  COPD with acute exacerbation. She also has underlying Wegener's granulomatosis. Will increase prednisone over the next week and then taper back to her usual dose. Start Levaquin 500 milligrams once daily for 7 days. Type 2 diabetes which has been stable. Monitor blood sugars closely.

## 2013-09-26 NOTE — Telephone Encounter (Signed)
[facility] states their fax number is [phone], per Crista. Please re-send fax or you can call the pharmacy at [phone] to give a verbal re-fill order.

## 2013-09-26 NOTE — Telephone Encounter (Signed)
Called in RX to pharmacy

## 2013-09-26 NOTE — Telephone Encounter (Signed)
Pt is needing a new rx for lantus sola star 100 units ml pens, send to [facility] telephone # [phone].

## 2013-09-26 NOTE — Progress Notes (Signed)
Pre visit review using our clinic review tool, if applicable. No additional management support is needed unless otherwise documented below in the visit note.

## 2013-09-26 NOTE — Telephone Encounter (Signed)
[facility] Triage Call Report Triage Record Num: [id number] Operator: Nancie Neas Patient Name: [name] [name] Call Date & Time: 09/26/2013 6:06:51AM Patient Phone: [phone] PCP: Carolann Littler Patient Gender: Female PCP Fax : [phone] Patient DOB: 1933-01-31 Practice Name: [facility] Reason for Call: Caller: [name]/Patient; PCP: Carolann Littler (Family Practice); CB#: [phone]; Today, 09/26/2013 pt calling with c/o of cough and wheezing . Pt stating she was hospitalized with pneumonia May 2015 and cough is still present. Pt took Albuterol last night at 2200 and then again this morning at 0400 and does help with symptoms and also causes her to cough up clear , white sticky mucous. Pt denies severe breathing problems. RN reached See in 24 hours for worsening cough and known respiratory condition per Cough - Adult protocol. Appt scheduled this morning at 0845 with Dr. Elease Hashimoto. Protocol(s) Used: Cough - Adult Recommended Outcome per Protocol: See Provider within 24 hours Reason for Outcome: New or worsening cough AND known cardiac or respiratory condition Care Advice: ~ Continue all prescription medication as recommended until evaluated by provider. ~ Call provider if symptoms worsen or new symptoms develop. If home oxygen has been prescribed, apply it at the recommended flow rate; DO NOT increase the flow rate before consulting provider. ~ ~ Avoid any activity that produces symptoms until evaluated by provider. Call EMS 911 if sudden worsening of breathing problem, dusky or blue/gray color of lips, fingernail beds or skin; chest pain, weakness, or confusion occurs. ~ ~ IMMEDIATE ACTION 06/

## 2013-09-26 NOTE — Patient Instructions (Signed)
Continue with home nebulizer Follow up promptly for any fever or increasing shortness of breath. Take prednisone from new prescription as directed then resume regular dose of prednisone.

## 2013-10-19 ENCOUNTER — Telehealth: Payer: Self-pay | Admitting: Family Medicine

## 2013-10-19 NOTE — Telephone Encounter (Signed)
I would defer to her pulmonologist.

## 2013-10-19 NOTE — Telephone Encounter (Signed)
Pt is on oxygen at night and would like to know how long she must continue  oxygen at night ?

## 2013-10-20 ENCOUNTER — Other Ambulatory Visit: Payer: Self-pay | Admitting: Family Medicine

## 2013-10-20 NOTE — Telephone Encounter (Signed)
Pt informed and will call Dr. Annamaria Boots office today

## 2013-10-27 ENCOUNTER — Other Ambulatory Visit: Payer: Self-pay | Admitting: *Deleted

## 2013-10-27 DIAGNOSIS — M3130 Wegener's granulomatosis without renal involvement: Secondary | ICD-10-CM

## 2013-10-27 DIAGNOSIS — C50919 Malignant neoplasm of unspecified site of unspecified female breast: Secondary | ICD-10-CM

## 2013-10-27 DIAGNOSIS — J441 Chronic obstructive pulmonary disease with (acute) exacerbation: Secondary | ICD-10-CM

## 2013-11-02 ENCOUNTER — Ambulatory Visit (INDEPENDENT_AMBULATORY_CARE_PROVIDER_SITE_OTHER): Payer: Medicare Other | Admitting: Cardiology

## 2013-11-02 ENCOUNTER — Encounter: Payer: Self-pay | Admitting: Cardiology

## 2013-11-02 VITALS — BP 118/63 | HR 53 | Ht 66.0 in | Wt 187.0 lb

## 2013-11-02 DIAGNOSIS — E871 Hypo-osmolality and hyponatremia: Secondary | ICD-10-CM

## 2013-11-02 DIAGNOSIS — M3130 Wegener's granulomatosis without renal involvement: Secondary | ICD-10-CM

## 2013-11-02 DIAGNOSIS — I4891 Unspecified atrial fibrillation: Secondary | ICD-10-CM | POA: Diagnosis not present

## 2013-11-02 DIAGNOSIS — I10 Essential (primary) hypertension: Secondary | ICD-10-CM | POA: Diagnosis not present

## 2013-11-02 DIAGNOSIS — I5032 Chronic diastolic (congestive) heart failure: Secondary | ICD-10-CM

## 2013-11-02 DIAGNOSIS — I509 Heart failure, unspecified: Secondary | ICD-10-CM

## 2013-11-02 DIAGNOSIS — I482 Chronic atrial fibrillation, unspecified: Secondary | ICD-10-CM

## 2013-11-02 NOTE — Progress Notes (Signed)
[facility]. [address], [address], [place]  [address] Phone: [phone] Fax:  [phone]  Date:  11/02/2013   ID:  HENRY DEMERITT, DOB [birth year], MRN [id number]  PCP:  [name], MD    History of Present Illness: [name] [name] is a 80 y.o. female with a hx of HTN, DM2, prior TIA, HL, breast CA, atrial fibrillation.  She is on Eliquis (CHADS2-VASc=6) for anticoagulation. She has a  dx of granulomatosis with polyangiitis (Wegener's).  Chest CT demonstrated no pulmonary embolism but did demonstrate alveolar hemorrhage pattern.  She was initially diuresed with Lasix but had worsening creatinine.  Infiltrates were felt to less likely be related to edema and Lasix was stopped.  Echo 11/02/12:  EF 55-60%, trivial AI, mod MR, mod to severe LAE, mild RVE, mod RAE, severe TR, severely increased PASP, oscillating density in RA (chiari network).   Eliquis was held until her hemoptysis cleared and was then resumed.  Since her last visit she was hospitalized with PNA in May associated with hypoxemia, cough, and fever. She has made a nice recovery. She denies any recurrent symptoms of cough or hemoptysis. She states her breathing is doing fairly well. She has lost 21 lbs since October. She is taking lasix 20 mg daily. No edema. She is still wearing oxygen at night. She avoids salt and enjoys eating fruits and vegetables.   Wt Readings from Last 3 Encounters:  11/02/13 187 lb (84.823 kg)  09/07/13 197 lb (89.359 kg)  08/25/13 202 lb (91.627 kg)     Past Medical History  Diagnosis Date  . HYPOTHYROIDISM 07/24/2008  . HYPERLIPIDEMIA 07/24/2008  . DEPRESSION 05/16/2009  . HYPERTENSION 07/24/2008  . Vertigo   . Diabetes mellitus type II   . Arthritis     r tkr  . CVA (cerebral vascular accident) 2008    mini stroke.no residual  . Heart murmur     stress test 2009.dr peter Martin[place]  . Intermittent vertigo   . Skin abnormality     facial lesions .pt applying mupiracin to areas  . Atrial  fibrillation   . Breast cancer   . Pneumonia   . Wegener's disease, pulmonary 10/2012    Current Outpatient Prescriptions  Medication Sig Dispense Refill  . acetaminophen (TYLENOL) 500 MG tablet Take 500 mg by mouth every 6 (six) hours as needed.      Marland Kitchen albuterol (PROVENTIL) (2.5 MG/3ML) 0.083% nebulizer solution Take 2.5 mg by nebulization every 6 (six) hours as needed for wheezing or shortness of breath.      Marland Kitchen amLODipine-olmesartan (AZOR) 5-40 MG per tablet Take 1 tablet by mouth at bedtime.       Marland Kitchen apixaban (ELIQUIS) 5 MG TABS tablet Take 5 mg by mouth 2 (two) times daily.      Marland Kitchen CALCIUM CITRATE PO Take 1,200 mg by mouth daily.        . cholecalciferol (VITAMIN D) 1000 UNITS tablet Take 3,000 Units by mouth daily.        . diazepam (VALIUM) 5 MG tablet Take 1 tablet (5 mg total) by mouth every 12 (twelve) hours as needed for anxiety.  30 tablet  1  . furosemide (LASIX) 20 MG tablet Take 1 tablet (20 mg total) by mouth daily.  90 tablet  3  . gabapentin (NEURONTIN) 100 MG capsule Take 1 capsule (100 mg total) by mouth 2 (two) times daily before a meal.  60 capsule  6  . glimepiride (AMARYL) 2 MG  tablet Take 1 tablet (2 mg total) by mouth daily before breakfast.  30 tablet  11  . guaiFENesin (MUCINEX) 600 MG 12 hr tablet Take 1 tablet (600 mg total) by mouth 2 (two) times daily.  10 tablet  0  . Insulin Glargine (LANTUS SOLOSTAR) 100 UNIT/ML Solostar Pen Inject 10 Units into the skin daily.  15 mL  5  . insulin glargine (LANTUS) 100 UNIT/ML injection Inject 10 Units into the skin at bedtime.      Marland Kitchen levofloxacin (LEVAQUIN) 500 MG tablet Take 1 tablet (500 mg total) by mouth daily.  7 tablet  0  . levothyroxine (SYNTHROID, LEVOTHROID) 125 MCG tablet Take 125 mcg by mouth daily before breakfast.      . loratadine (CLARITIN) 10 MG tablet Take 10 mg by mouth daily as needed for allergies.      Marland Kitchen nebivolol (BYSTOLIC) 10 MG tablet Take 10 mg by mouth daily after breakfast.       . predniSONE  (DELTASONE) 20 MG tablet Alternate between 1/2 tab and 1/4 tab every other day      . pyridOXINE (VITAMIN B-6) 100 MG tablet Take 100 mg by mouth every morning.      . rosuvastatin (CRESTOR) 5 MG tablet Take 1 tablet (5 mg total) by mouth daily.  30 tablet  11  . sodium chloride (OCEAN) 0.65 % SOLN nasal spray Place 1 spray into both nostrils as needed for congestion.  60 mL  0   No current facility-administered medications for this visit.    Allergies:    Allergies  Allergen Reactions  . Codeine Sulfate Other (See Comments)    REACTION: GI upset  . Sulfonamide Derivatives Other (See Comments)    REACTION: GI upset  . Atarax [Hydroxyzine] Nausea Only and Rash    Social History:  The patient  reports that she quit smoking about 31 years ago. Her smoking use included Cigarettes. She has a 6 pack-year smoking history. She has never used smokeless tobacco. She reports that she does not drink alcohol or use illicit drugs.   ROS:  Please see the history of present illness.   No hemoptysis.   All other systems reviewed and negative.   PHYSICAL EXAM: VS:  BP 118/63  Pulse 53  Ht _0  (1.676 m)  Wt 187 lb (84.823 kg)  BMI 30.20 kg/m2 Well nourished, well developed, in no acute distress. She is seen in a wheelchair. HEENT: normal Neck: no JVD or bruits Cardiac:  normal S1, S2; irregularly irregular rhythm; no murmur or gallop Lungs:  Decreased breath sounds bilaterally. Scant crackles in bases. Abd: soft, nontender Ext: no ankle edema Skin: warm and dry Neuro:  CNs 2-12 intact, no focal abnormalities noted  Laboratory data:  Lab Results  Component Value Date   WBC 8.5 08/25/2013   HGB 8.2 Repeated and verified X2.* 08/25/2013   HCT 24.9 Repeated and verified X2.* 08/25/2013   PLT 389.0 08/25/2013   GLUCOSE 92 09/16/2013   CHOL 140 10/27/2012   TRIG 117.0 10/27/2012   HDL 39.50 10/27/2012   LDLDIRECT 75.3 10/21/2011   LDLCALC 77 10/27/2012   ALT 10 08/18/2013   AST 19 08/18/2013   NA  127* 09/16/2013   K 4.6 09/16/2013   CL 92* 09/16/2013   CREATININE 1.8* 09/16/2013   BUN 22 09/16/2013   CO2 29 09/16/2013   TSH 1.824 05/07/2013   INR 1.70* 08/18/2013   HGBA1C 6.4 09/16/2013   MICROALBUR 1.7 08/15/2009  ASSESSMENT AND PLAN:  1. Atrial Fibrillation:  Rate controlled.  No  hemoptysis.  She is on the correct dose of Eliquis for her (age > 29 and weight > 60 kg).  CHADS2-VASc=6.  She would be at significant risk of stroke if she has to stop anticoagulation. 2. Pulmonary HTN:  Likely related to sequelae of Wegener's. 3. Hypertension:  BP well controlled.  Continue to monitor. 4. Hyperlipidemia:  Continue statin. 5. Granulomatosis with Polyangiitis (Wegener's):  Continue follow up with pulmonary and oncology as directed.   6. PNA-resolved. 7. Chronic diastolic dysfunction. Well compensated

## 2013-11-02 NOTE — Patient Instructions (Signed)
Continue your current therapy  I will see you in 6 months.

## 2013-11-07 ENCOUNTER — Telehealth: Payer: Self-pay | Admitting: Internal Medicine

## 2013-11-07 NOTE — Telephone Encounter (Signed)
Per OV 09/07/13; Patient Instructions     Try using otc antihistamine like Claritin/ loratadine once daily for itching  Ok to use your oxygen 2L just for sleep and if needed during the day. Your resting room air saturation today was 96-98%, which is good.  Ok not to take Zofran/ odansetron, which would have been prescribed for nausea, since you don't need it  ---   I called made pt aware of OV recs. She voiced her understanding and needed nothing further

## 2013-11-10 ENCOUNTER — Encounter: Payer: Self-pay | Admitting: Family Medicine

## 2013-11-10 NOTE — Assessment & Plan Note (Signed)
Chronic granulomatous lung disease/Wegener's Plan-his oxygen for sleep and as needed. We will track oxygen saturation but she is doing well now at rest on room air.

## 2013-11-10 NOTE — Assessment & Plan Note (Signed)
Most recent chest x-ray looks very clear, noting cardiac enlargement

## 2013-12-05 ENCOUNTER — Telehealth: Payer: Self-pay | Admitting: Family Medicine

## 2013-12-05 ENCOUNTER — Telehealth: Payer: Self-pay | Admitting: Cardiology

## 2013-12-05 NOTE — Telephone Encounter (Signed)
Returned call to patient samples of eliquis 5 mg left at front desk of [facility] office.

## 2013-12-05 NOTE — Telephone Encounter (Signed)
Pt need some samples of Eliquis please.Please call her either way to let her know.

## 2013-12-05 NOTE — Telephone Encounter (Signed)
Alex called from [facility]  to ask if an rx that was faxed over has been signed and sent back to them    Fax number; [phone]

## 2013-12-19 ENCOUNTER — Telehealth: Payer: Self-pay | Admitting: Family Medicine

## 2013-12-19 MED ORDER — VALSARTAN 160 MG PO TABS
160.0000 mg | ORAL_TABLET | Freq: Every day | ORAL | Status: DC
Start: 2013-12-19 — End: 2014-06-14

## 2013-12-19 MED ORDER — AMLODIPINE BESYLATE 5 MG PO TABS: 5.0000 mg | ORAL_TABLET | Freq: Every day | ORAL | Status: DC

## 2013-12-19 NOTE — Telephone Encounter (Signed)
Pt calling to request the following:  1. Samples of Eliquis  2. Samples of Azor, i advised pt we no longer carry this as a sample/  Pt asked if she could be switched to a cheaper medication as this med costs over $100 to fill.

## 2013-12-19 NOTE — Telephone Encounter (Signed)
D/C Azor Start Amlodipine 5 mg daily and Valsartan 160 mg once daily Office follow up within one month to reassess BP.

## 2013-12-19 NOTE — Telephone Encounter (Signed)
Pt informed Rx's sent to pharmacy. Samples are ready for pickup

## 2013-12-26 ENCOUNTER — Telehealth: Payer: Self-pay | Admitting: Family Medicine

## 2013-12-26 NOTE — Telephone Encounter (Signed)
Pt informed.

## 2013-12-26 NOTE — Telephone Encounter (Signed)
Pt called to say that amLODipine (NORVASC) 5 MG tablet and    valsartan (DIOVAN) 160 MG tablet made bp go down to low. Pt said bp 131/64 her sugar was down to 62 . She said when she gets up in the morning she all over the place. She said  this was the first time she has taken the above meds with sugar med. She said taking the bp medicine and her sugar meds all together made her sick .She said it mad her so sick she felt like calling 911.

## 2013-12-26 NOTE — Telephone Encounter (Signed)
BP reported is actually a good BP.   She could split up her BP meds and take one at night and one in AM.

## 2014-01-03 ENCOUNTER — Other Ambulatory Visit: Payer: Self-pay

## 2014-01-03 MED ORDER — APIXABAN 5 MG PO TABS: 5.0000 mg | ORAL_TABLET | Freq: Two times a day (BID) | ORAL | Status: DC

## 2014-01-03 MED ORDER — INSULIN GLARGINE 100 UNIT/ML SOLOSTAR PEN: 10.0000 [IU] | PEN_INJECTOR | Freq: Every day | SUBCUTANEOUS | Status: DC

## 2014-01-03 MED ORDER — ROSUVASTATIN CALCIUM 5 MG PO TABS: 5.0000 mg | ORAL_TABLET | Freq: Every day | ORAL | Status: DC

## 2014-01-09 ENCOUNTER — Encounter: Payer: Self-pay | Admitting: Internal Medicine

## 2014-01-09 ENCOUNTER — Ambulatory Visit (INDEPENDENT_AMBULATORY_CARE_PROVIDER_SITE_OTHER): Payer: Medicare Other | Admitting: Internal Medicine

## 2014-01-09 VITALS — BP 132/84 | HR 67 | Ht 66.0 in | Wt 199.4 lb

## 2014-01-09 DIAGNOSIS — K439 Ventral hernia without obstruction or gangrene: Secondary | ICD-10-CM | POA: Diagnosis not present

## 2014-01-09 DIAGNOSIS — M3130 Wegener's granulomatosis without renal involvement: Secondary | ICD-10-CM

## 2014-01-09 DIAGNOSIS — Z23 Encounter for immunization: Secondary | ICD-10-CM

## 2014-01-09 DIAGNOSIS — K469 Unspecified abdominal hernia without obstruction or gangrene: Secondary | ICD-10-CM | POA: Insufficient documentation

## 2014-01-09 NOTE — Assessment & Plan Note (Signed)
This process is either in remission, or controlled by low-dose prednisone Plan-Reassess home oxygen need- ONOX on room air.

## 2014-01-09 NOTE — Progress Notes (Signed)
04/03/11- 80 year old female former smoking history referred courtesy of Dr. Elease Hashimoto with concern of abnormal chest CT. Husband is an allergy patient here. Dr. Erick Blinks recent office note reviewed "Followup from recent pneumonia. Patient had presented here with extreme myalgias and chronic pain along with bilateral shoulder hip pain. Very high sedimentation rate of 120.  She almost total resolution of myalgias with low dose prednisone strongly suggesting polymyalgia rheumatica. She subsequently presented with cough with trace of blood but no dyspnea, fever, reported a pain. Chest x-ray showed bilateral infiltrates suggesting probable bilateral pneumonia the recommendation for CT scan. CT scan revealed no adenopathy and no evidence for a lung mass. Compatible with bilateral pneumonia. Patient placed on antibiotic and much better at this time with no fever and essentially no cough. Her muscle and joint pains are almost 100% resolved with low-dose prednisone. She did present back in September with severe sinus disease suggesting acute sinusitis. CT revealed no acute findings. She denies any arthralgias at this time. No skin rash. No fever." She has been on prednisone 10 mg twice daily since December 13 and feels significantly better. Morning cough has cleared with residual trace phlegm and trace blood. No chest pain. CT chest 03/23/2011 -images reviewed by me-bilateral patchy airspace disease with air bronchograms. No cavitation or definite nodularity seen. She has had right total knee replacement but no generalized arthritis. Childhood exposure to an uncle with tuberculosis. Her PPD skin test was negative. She denies history of lung disease, GERD, DVT, kidney or liver disease. Lab- C-ANCA POS 1:320, P-ANCA NEG. RA 1:103, ANA NEG66 year old female former smoking history referred courtesy of Dr. Elease Hashimoto with concern of abnormal chest CT. Husband is an allergy patient here.  05/13/11- 38 yoF former smoker  followed for bilateral pulmonary infiltrates, incr C-ANCA, s/p VATS bx/ organizing pneumonia, Metastatic intestinal carcinoid. Marland Kitchen Hx breast Ca 2006/ Dr Truddie Coco Husband here. She returns now after left lung biopsy. She has expected left thoracotomy pain but is otherwise stable as she continues maintenance prednisone. Denies blood, fever, swollen glands, discolored sputum. Exertional dyspnea is not worse, allowing for the surgery. I shared with her and her husband the available initial pathology: Patchy organizing pneumonia with chronic interstitial pneumonia and focal atypical glands. I reviewed the discussion about her at thoracic oncology conference. The parenchymal lung process is not a vasculitis as I originally questioned. I told her there was an incidental finding of cancer cells with further identification pending but that this was a separate process. We agreed to refer her to Dr. Truddie Coco who managed her breast cancer. After she had left, personal communication from Dr. Jimmy Footman- neuroendocrine tumor staining consistent with metastatic intestinal carcinoid.  06/20/11  64 yoF former smoker followed for bilateral pulmonary infiltrates, incr C-ANCA, s/p VATS bx/ organizing pneumonia, Metastatic intestinal carcinoid. Marland Kitchen Hx breast Ca 2006/ Dr Truddie Coco   Husband here She says her breathing is now "fine" back to her normal. She denies fever, cough, sputum. I spoke with Mrs. Everetts myself and had to explain the discovery of carcinoid after her lung biopsy. We also have the note from the [facility] indicating they discussed it with her. She has absolutely no recollection. Little cough or shortness of breath now. Still a little posterior thoracotomy incision pain. CT 05/19/11- images reviewed with them- IMPRESSION:  1. Marked improvement in bilateral air space disease seen on  comparison CT of 03/24/2011. Near complete resolution.  2. Postoperative change in the left hemithorax consistent with  recent VATS.   Original Report  Authenticated By: Suzy Bouchard, M.D.   11/07/11- 57 yoF former smoker followed for bilateral pulmonary infiltrates, incr C-ANCA, s/p VATS bx/ organizing pneumonia, Metastatic intestinal carcinoid. Marland Kitchen Hx breast Ca 2006/ Dr Truddie Coco    c/o  Chronic vertigo, difficulty breathing and gets tired easy. Also some wheezing, post nasal,  and cough.  Some days she feels much better than others, no real pattern. Occasional light cough to clear throat. We had called in prednisone, which did help. Ambulatory around home, but gets out little. CT chest 09/09/11- I reviewed w/ her- there is significant patchy bilateral lower zone fibrotic scarring and some bronchial thickening. IMPRESSION:  1. No evidence of thoracic metastatic disease or other active  process.  2. Small mild cardiomegaly and hiatal hernia.  Original Report Authenticated By: Marlaine Hind, M.D.    02/09/12-  27 yoF former smoker followed for bilateral pulmonary infiltrates, incr C-ANCA, s/p VATS bx/ cryptogenic organizing pneumonia/ BOOP, Metastatic intestinal carcinoid. Marland Kitchen Hx breast Ca 2006/ Dr Truddie Coco  January had biopsy to ck for other lung condition. SOB and wheezing at times. Left sided pain around the  Back. Continues Advair 250. Due for followup chest CT in December for Dr. Jacquiline Doe. Not on prednisone. She says breathing is unchanged. Denies pain, blood or swollen nodes.   05/11/12-  89 yoF former smoker followed for bilateral pulmonary infiltrates, incr C-ANCA, s/p VATS bx/ cryptogenic organizing pneumonia/ BOOP, Metastatic intestinal carcinoid. Complicated by Hx breast Ca 2006/ Dr Truddie Coco. AFib/ Dr Martinique, DM. She says her husband is developing dementia. Deep breath makes her left flank feel tight where she had thoracentesis. Complains of itching since starting Eliquis.Atarax 50 mg was too strong and Benadryl did not help. Itching keeps her awake all night. No visible rash. She is convinced really unpleasant itching began when  she started Eliquis. I explained this medication needs to be transitioned carefully to anything else and Dr. Martinique would need to be the one to manage that. CT chest 03/26/12 . IMPRESSION:  1. No findings to strongly suggest metastatic disease to the  thorax.  2. There is a new focus of nodular ground-glass attenuation  measuring 1 cm in the anterior aspect of the left upper lobe which  is highly nonspecific. Initial follow-up by chest CT without  contrast is recommended in 3 months to confirm persistence. This  recommendation follows the consensus statement: Recommendations for  the Management of Subsolid Pulmonary Nodules Detected at CT: A  Statement from the [employer] Society as published in Radiology  2013; 266:304-317.  3. Atherosclerosis, including left main and three-vessel coronary  artery disease. Assessment for potential risk factor  modification, dietary therapy or pharmacologic therapy may be  warranted, if clinically indicated.  4. Slight increase in the small amount of pericardial fluid  compared to the prior examination. This is unlikely to be of  hemodynamic significance at this time.  5. Moderate cardiomegaly.  6. Additional incidental findings, similar to prior studies, as  discussed above.  Original Report Authenticated By: Vinnie Langton, M.D.   08/08/12- 15 yoF former smoker followed for bilateral pulmonary infiltrates/ nodules, incr C-ANCA, s/p VATS bx/ cryptogenic organizing pneumonia/ BOOP, Metastatic intestinal carcinoid. Complicated by Hx breast Ca 2006/ Dr Truddie Coco. AFib/ Dr Martinique, DM. Itching stopped, while continuing Eliquis. She had changed detergents and fabric softener, but cause unknown. Seasonal rhinitis symptoms now. Total IgE 05/11/2012 was less than 1.5. New burning itch, intermittent, right lateral ribs x2 weeks. Has had shingles shot. No visible rash. CT chest  08/02/12 IMPRESSION:  Interval persistence but decrease in size of the ground-glass   nodules seen in the anterior left upper lobe. Adenocarcinoma  cannot be excluded. Followup by CT is recommended in 12 months,  with continued annual surveillance for a minimum of 3 years.  These recommendations are taken from "Recommendations for the  Management of Subsolid Pulmonary Nodules Detected at CT: A  Statement from the [employer]" Radiology 2013; 266:1, 304-  317.  Original Report Authenticated By: Misty Stanley, M.D.   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 Relapse since 2013 11/02/12 PR-3 880. MPO negative. - GBM neg Likely - capillaritis. 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 Admitted with hemoptysis  hemoptysis with CT scan chest findings of alveolar hemorrhage  As of June 2014 the oncologist did not feel there was any evidence of breast cancer recurrence.CT scan of the chest ruled out pulmonary embolism but showed severe bilateral groundglass opacities consistent with alveolar hemorrhage pattern. Pulmonary consultation was called. She was initially treated by stopping eliquis, providing empiric antibiotics and supported with diuresis.  Pulmonary  felt The clinical picture was felt to be consistent with diffuse alveolar hemorrhage resulting in hemoptysis. Most likely cause of alveolar hemorrhage was felt to be autoimmune based on past history of autoimmune antibody positivity and lung biopsy showing organizing pneumonia in December 2012/January 2013. Prior C.-ANCA positive though no evidence of vasculitis in Jan 2013 lung bx which is puzzling. Alternative is relapsing BOOP but the hemoptysis would be unsual unless precipitated by Eliquis. On 11/02/12 PR-3 880. Nephrology consult  On  7/29 immunosuppression therapy was initiated. She was given Pulse methylprednisolone, for three days, and her first dose of Rituxan was given on 7/30. The following days we saw significant clinical improvement in regards to hemoptysis, pulmonary infiltrates and dyspnea, She was started on  Rituxan over cyclophosphamide due to malignancy history, and also this is technically a "relapse" or A "flare". Dose starting 11/03/12 will be Induction therapy with rituximab (375 mg/m2 per week for four weeks). W/  PCP prophylaxis w/ - Bactrim 1 DS on M, W, F  Since discharge she is feeling better with resolution of hemoptysis, . Dyspnea is less and she is starting to get some of her energy back.  No fever , orthopnea or edema.  Had labs done last week. , hbg stable at 8.9 , renal fx tr down at 1.6 (max scr 2.4) . Ov with oncology last week. Rituxan  infusion given.   12/03/12- 85 yoF former smoker followed for bilateral pulmonary infiltrates/ nodules, incr C-ANCA, s/p VATS bx/ Granulomatous Polyangiitis (Wegener's) vs cryptogenic organizing pneumonia/ BOOP, Metastatic intestinal carcinoid. Complicated by Hx breast Ca 2006/ Dr Truddie Coco. AFib/ Dr Martinique, DM. Started Rituxan 375 mg/m2x 4 weeks 11/03/12. PCP prophy Bactrim 1DS on M,W,F. FOLLOWS FOR: reports doing well since ov w/ TP.  still doing well on Dulera, but needs a refill.  still taking prednisone 9m daily. 1 dose of Rituxan still pending. Had to have transfusion last week for hemoglobin 7.7. No hemoptysis since a little bit at first recognition of her granulomatous disease. She has continued prednisone 60 mg daily since hospital discharge, and we have discussed tapering the dose. No coughing, no chest pain or fever.  01/17/13- 57 yoF former smoker followed for bilateral pulmonary infiltrates/ nodules, incr C-ANCA, s/p VATS bx/ Granulomatous Polyangiitis (Wegener's) vs cryptogenic organizing pneumonia/ BOOP, Metastatic intestinal carcinoid. Complicated by Hx breast  Ca 2006/ Dr Truddie Coco. AFib/ Dr Martinique, DM. FOLLOWS FOR:  Breathing has improved since last OV needs refill on dulera Had flu vaccine Paces herself. Feels she is getting stronger. Has not needed Dulera in 2 or 3 weeks.  04/22/14- 50 yoF former smoker followed for bilateral pulmonary infiltrates/ nodules, incr C-ANCA, s/p VATS bx/ Granulomatous Polyangiitis (Wegener's) vs cryptogenic organizing pneumonia/ BOOP, Metastatic intestinal carcinoid. Complicated by Hx breast Ca 2006/ Dr Truddie Coco. AFib/ Dr Martinique, DM. FOLLOWS FOR: recently in hospital; bronchitis Hospital 1/7-1/12/15- acute bronchitis/respiratory failure/probably viral. There may have been fluid overload, she feels better now on daily Lasix. Prednisone now at 1/2x20 mg daily. CT chest 04/14/13 IMPRESSION:  1. Stable cardiac enlargement and coronary artery calcifications.  2. Patchy ground-glass opacity in both lungs with vague nodularity.  Findings suggest a partial airspace filling process such as edema,  hemorrhage or inflammatory or infectious alveolitis. No focal  airspace consolidation.  3. Possible new cystic lesion in the pancreatic body/tail junction  region. Please see above discussion.  Electronically Signed  By: Kalman Jewels M.D.  On: 04/14/2013 10:56  05/27/13- 75 yoF former smoker followed for bilateral pulmonary infiltrates/ nodules, incr C-ANCA, s/p VATS bx/ Granulomatous Polyangiitis (Wegener's) vs cryptogenic organizing pneumonia/ BOOP, Metastatic intestinal carcinoid. Complicated by Hx breast Ca 2006/ Dr Truddie Coco. AFib/ Dr Martinique, DM. FOLLOWS FOR:  Breathing doing well-- no concerns today Minimal dry cough. Not needing Dulera or O2. Neb BID helps. CXR 05/09/13 1V FINDINGS:  Marked cardiopericardial enlargement. Unchanged tortuosity of the  aorta. The lungs are hyperinflated chronically. There is pulmonary  venous congestion without edema or effusion. No pneumothorax. No  asymmetric opacity.  IMPRESSION:  Pulmonary venous  congestion without edema or effusion.  Electronically Signed  By: Jorje Guild M.D.  On: 05/09/2013 05:54 ECHO 04/14/13 Study Conclusions - Left ventricle: The cavity size was normal. Wall thickness was increased in a pattern of mild LVH. Systolic function was normal. The estimated ejection fraction was in the range of 60% to 65%. - Mitral valve: Mild to moderate regurgitation. - Left atrium: The atrium was moderately to severely dilated. - Right ventricle: The cavity size was mildly to moderately dilated. Wall thickness was normal. Systolic function was mildly reduced. - Right atrium: The atrium was severely dilated. - Tricuspid valve: Moderate regurgitation. - Pulmonary arteries: PA peak pressure: 3m Hg (S). Transthoracic echocardiography. M-mode, complete 2D, spectral Doppler, and color Doppler. Height: Height: 166.4cm. Height: 65.5in. Weight: Weight: 94.3kg. Weight: 207.5lb. Body mass index: BMI: 34.1kg/m^2. Body surface area: BSA: 2.137m. Blood pressure: 145/72. Patient status: Inpatient. Location: Bedside.  07/19/13- [age]oF former smoker followed for bilateral pulmonary infiltrates/ nodules, incr C-ANCA, s/p VATS bx/ Granulomatous Polyangiitis (Wegener's) vs cryptogenic organizing pneumonia/ BOOP, Metastatic intestinal carcinoid. Complicated by Hx breast Ca 2006/ Dr RuTruddie CocoAFib/ Dr JoMartiniqueDM. FOLLOWS FOR: had to use nebulizer treatment due to sitting outside with increased pollen; feels better now and no new complaints Pollen caused cough and watery nose. Nebulizer helps with occasional use. Continues maintenance prednisone 1/2 x 20 mg daily.  [facility]  [facility] for .a post [facility] follow up .  She was admitted with community-acquired pneumonia versus acute bronchitis. Chest x-ray showed asymmetrical airspace disease superimposed on chronic lung changes. She was treated with aggressive IV antibiotics, and discharged on a course  of Levaquin .she was  discharged on oxygen therapy. Says still weak from hospital stay.  Has ov with PCP today for labs.  Finished abx earlier today. She denies any hemoptysis, orthopnea, PND, or leg Swelling .   09/07/13- 81 yoF former smoker followed for bilateral pulmonary infiltrates/ nodules, incr C-ANCA, s/p VATS bx/ Granulomatous Polyangiitis (Wegener's) vs cryptogenic organizing pneumonia/ BOOP, Metastatic intestinal carcinoid. Complicated by Hx breast Ca 2006/ Dr Truddie Coco. AFib/ PHTN Dr Martinique, DM FOLLOWS FOR: pt would like to come off O2-was d/c'd home with it and does not like it. Pt states she feels much better from 2 weeks ago. CXR 09/07/13 FINDINGS:  Underlying chronic interstitial disease is stable. There is no frank  edema or airspace consolidation currently. There has been apparent  interval clearing of subtle opacities on the right, which were felt  to represent infiltrate superimposed on chronic disease.  Heart is enlarged with pulmonary vascularity within normal limits.  No adenopathy. There is evidence of an old fracture of the left  posterior sixth rib, stable.  IMPRESSION:  Stable chronic interstitial disease and cardiomegaly. No  superimposed edema or consolidation is appreciable currently. No new  opacities appreciable compared to most recent prior study.  Electronically Signed  By: Lowella Grip M.D.  On: 09/07/2013 09:24  01/09/14- 89 yoF former smoker followed for bilateral pulmonary infiltrates/ nodules, incr C-ANCA, s/p VATS bx/ Granulomatous Polyangiitis (Wegener's) vs cryptogenic organizing pneumonia/ BOOP, Metastatic intestinal carcinoid. Complicated by Hx breast Ca 2006. AFib/ PHTN Dr Martinique, DM FOLLOWS FOR: Pt states her breathing is doing good; wonders if she can come off her O2 QHS; has not had to use her neb tx since last visit. Has not been needing her nebulizer or inhalers at all. Daily prednisone 5-10 mg She asks me about an incisional hernia near her umbilicus. Not painful.  She would like to talk to a surgeon about it.  We talked about respiratory limits for surgery and anesthesia.  ROS-see HPI Constitutional:   No-   weight loss, night sweats, fevers, chills, +fatigue, lassitude. HEENT:   No-  headaches, difficulty swallowing, tooth/dental problems, sore throat,       No-  sneezing, itching, ear ache, nasal congestion, post nasal drip,  CV: No -chest pain, orthopnea, PND, swelling in lower extremities, anasarca,    No-palpitations Resp: +   shortness of breath with exertion or at rest.           No- productive cough,  No-non-productive cough,               No-   change in color of mucus.  No- wheezing.   Skin: No-   rash or lesions. no- pruritus GI:  No-   heartburn, indigestion, abdominal pain, nausea, vomiting,  GU: MS:  No-   joint pain or swelling.  Neuro-     +Vertigo- uses wheelchair Psych:  No- change in mood or affect. No depression or anxiety.  No memory loss.  OBJ General- Alert, Oriented, Affect-appropriate, Distress- none acute, overweight, +wheelchair Skin- rash-none, lesions- none, excoriation- none Lymphadenopathy- none Head- atraumatic            Eyes- Gross vision intact, PERRLA, conjunctivae -pale            Ears- Hearing, canals-normal            Nose- turbinate edema, no-Septal dev, mucus, polyps, erosion, perforation             Throat- Mallampati II-III , mucosa clear ,  drainage- none, tonsils- atrophic Neck- flexible , trachea midline, no stridor , thyroid nl, carotid no bruit Chest - symmetrical excursion , unlabored           Heart/CV-+ irregularly irregular/ AFib , 2-3/6 precordial systolic murmur , no gallop  ,                             no rub, Nl s1 s2                           - JVD+full , edema-none, stasis changes- none, varices- none           Lung- clear, no wheeze , cough+raspy , dullness-none, rub- none           Chest wall- healed VATS thoracotomy incision L Abd-  +R para-umbilical hernia, firm, non-tender. Br/  Gen/ Rectal- Not done, not indicated Extrem- cyanosis- none, clubbing, none, atrophy- none, strength- nl. Wheelchair for distance/             cane at home.  Neuro- grossly intact to observation

## 2014-01-09 NOTE — Assessment & Plan Note (Signed)
Hernia is firm but nontender. I did not try to force reduction. Did not hear bowel sounds Not an easy surgical candidate but I think she would tolerate anesthesia and surgery to repair her hernia. Plan-She would liketo work with Dr. Alphonsa Overall.

## 2014-01-09 NOTE — Patient Instructions (Signed)
Flu vax  Order- [facility]  ONOX on room air dx Wegener's granulomatosis  Order- Surgery referral to Dr Alphonsa Overall    Dx ventral hernia

## 2014-01-16 ENCOUNTER — Ambulatory Visit (INDEPENDENT_AMBULATORY_CARE_PROVIDER_SITE_OTHER): Payer: Medicare Other | Admitting: Family Medicine

## 2014-01-16 ENCOUNTER — Encounter: Payer: Self-pay | Admitting: Family Medicine

## 2014-01-16 VITALS — BP 120/80 | Temp 98.0°F | Wt 196.8 lb

## 2014-01-16 DIAGNOSIS — E871 Hypo-osmolality and hyponatremia: Secondary | ICD-10-CM | POA: Diagnosis not present

## 2014-01-16 DIAGNOSIS — E1121 Type 2 diabetes mellitus with diabetic nephropathy: Secondary | ICD-10-CM

## 2014-01-16 DIAGNOSIS — I10 Essential (primary) hypertension: Secondary | ICD-10-CM | POA: Diagnosis not present

## 2014-01-16 DIAGNOSIS — E039 Hypothyroidism, unspecified: Secondary | ICD-10-CM | POA: Diagnosis not present

## 2014-01-16 DIAGNOSIS — E162 Hypoglycemia, unspecified: Secondary | ICD-10-CM

## 2014-01-16 LAB — TSH: TSH: 0.17 u[IU]/mL — ABNORMAL LOW (ref 0.35–4.50)

## 2014-01-16 LAB — LIPID PANEL
CHOL/HDL RATIO: 3
Cholesterol: 135 mg/dL (ref 0–200)
HDL: 47.8 mg/dL (ref >39.00)
LDL Cholesterol: 64 mg/dL (ref 0–99)
NONHDL: 87.2
TRIGLYCERIDES: 114 mg/dL (ref 0.0–149.0)
VLDL: 22.8 mg/dL (ref 0.0–40.0)

## 2014-01-16 LAB — BASIC METABOLIC PANEL
BUN: 23 mg/dL (ref 6–23)
CO2: 22 meq/L (ref 19–32)
CREATININE: 1.8 mg/dL — AB (ref 0.4–1.2)
Calcium: 8.8 mg/dL (ref 8.4–10.5)
Chloride: 96 mEq/L (ref 96–112)
GFR: 29.19 mL/min — ABNORMAL LOW (ref >60.00)
GLUCOSE: 66 mg/dL — AB (ref 70–99)
Potassium: 4.5 mEq/L (ref 3.5–5.1)
SODIUM: 128 meq/L — AB (ref 135–145)

## 2014-01-16 LAB — HEPATIC FUNCTION PANEL
ALK PHOS: 58 U/L (ref 39–117)
ALT: 12 U/L (ref 0–35)
AST: 22 U/L (ref 0–37)
Albumin: 3.3 g/dL — ABNORMAL LOW (ref 3.5–5.2)
BILIRUBIN TOTAL: 0.8 mg/dL (ref 0.2–1.2)
Bilirubin, Direct: 0 mg/dL (ref 0.0–0.3)
Total Protein: 7.2 g/dL (ref 6.0–8.3)

## 2014-01-16 LAB — HEMOGLOBIN A1C: Hgb A1c MFr Bld: 6.5 % (ref 4.6–6.5)

## 2014-01-16 NOTE — Progress Notes (Signed)
Pre visit review using our clinic review tool, if applicable. No additional management support is needed unless otherwise documented below in the visit note.

## 2014-01-16 NOTE — Patient Instructions (Signed)

## 2014-01-16 NOTE — Progress Notes (Signed)
Subjective:    Patient ID: [name] [name], female    DOB: 21-Dec-1932, 80 y.o.   MRN: [id number]  HPI Multiple chronic medical problems obesity, type 2 diabetes, chronic diastolic heart failure, hypertension, history of atrial fibrillation, chronic kidney disease, Wegener's granulomatosis. She has done well recently. Has had some recent hypoglycemic symptoms and frequent blood sugars in the 60s and occasionally below.  Currently takes Lantus 10 units once daily and Amaryl 2 mg daily. She eats regular meals. Last A1c 6.4%. Hypothyroidism treated with levothyroxine 125 migrans daily. Compliant with all medications. No recent fevers or chills. No cough. Trace edema which is chronic and stable.  Past Medical History  Diagnosis Date  . HYPOTHYROIDISM 07/24/2008  . HYPERLIPIDEMIA 07/24/2008  . DEPRESSION 05/16/2009  . HYPERTENSION 07/24/2008  . Vertigo   . Diabetes mellitus type II   . Arthritis     r tkr  . CVA (cerebral vascular accident) 2008    mini stroke.no residual  . Heart murmur     stress test 2009.dr peter Martinique  . Intermittent vertigo   . Skin abnormality     facial lesions .pt applying mupiracin to areas  . Atrial fibrillation   . Breast cancer   . Pneumonia   . 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 Knee surgery  2009    TKR  . Cholecystectomy  1980  . Joint replacement      r knee  . Breast surgery  2007    Lumpectomy, XRT 2006.l breast  . Video assisted thoracoscopy  04/22/2011    Procedure: VIDEO ASSISTED THORACOSCOPY;  Surgeon: Melrose Nakayama, MD;  Location: [facility];  Service: Thoracic;  Laterality: Left;  WITH BIOPSY  . Heel spur surgery Right   . Exploratory laparotomy      reports that she quit smoking about 31 years ago. Her smoking use included Cigarettes. She has a 6 pack-year smoking history. She has never used smokeless tobacco. She reports that she does not drink alcohol or use illicit drugs. family  history includes Arthritis in her mother; Breast cancer in her sister; Cancer in her sister; Coronary artery disease in her brother and father; Heart disease in her father; Lung cancer in her brother; Rheum arthritis in her mother. Allergies  Allergen Reactions  . Codeine Sulfate Other (See Comments)    REACTION: GI upset  . Sulfonamide Derivatives Other (See Comments)    REACTION: GI upset  . Atarax [Hydroxyzine] Nausea Only and Rash      Review of Systems  Constitutional: Negative for fever, chills, appetite change and unexpected weight change.  Respiratory: Negative for cough and shortness of breath.   Cardiovascular: Negative for chest pain and palpitations.  Gastrointestinal: Negative for abdominal pain.  Genitourinary: Negative for dysuria.       Objective:   Physical Exam  Constitutional: She appears well-developed and well-nourished.  Neck: Neck supple. No thyromegaly present.  Cardiovascular: Normal rate and regular rhythm.   Pulmonary/Chest: Effort normal and breath sounds normal. No respiratory distress. She has no wheezes. She has no rales.  Musculoskeletal:  Trace Edema of legs and ankles bilaterally          Assessment & Plan:  #1 type 2 diabetes. History of recent good control. When she is off steroids, blood sugars have been very well controlled. She is not a candidate for metformin with chronic kidney disease. Recent hypoglycemia. Stop insulin. If symptoms continue also will  discontinue Amaryl #2 history of chronic hyponatremia. Only takes furosemide 20 mg daily and no other diuretics. Recheck basic metabolic panel  #3 chronic kidney disease. Recheck basic metabolic panel  #4 hypothyroidism. Check TSH  #5 hypertension which is well-controlled #6 dyslipidemia. Check lipid and hepatic panel. Continue Crestor. #7 history of chronic atrial fibrillation. She remains on Eliquis with no recent bleeding complications

## 2014-01-17 ENCOUNTER — Other Ambulatory Visit: Payer: Self-pay | Admitting: Family Medicine

## 2014-01-17 ENCOUNTER — Other Ambulatory Visit: Payer: Self-pay

## 2014-01-17 DIAGNOSIS — E039 Hypothyroidism, unspecified: Secondary | ICD-10-CM

## 2014-01-17 MED ORDER — LEVOTHYROXINE SODIUM 112 MCG PO TABS: 112.0000 ug | ORAL_TABLET | Freq: Every day | ORAL | Status: DC

## 2014-01-19 ENCOUNTER — Telehealth: Payer: Self-pay | Admitting: Family Medicine

## 2014-01-19 NOTE — Telephone Encounter (Signed)
Pt aware that samples are ready for pick up

## 2014-01-19 NOTE — Telephone Encounter (Signed)
Pt states she is in the donut hole and would like to know if she can have samples of Eliquis 5 mg to hold her over??

## 2014-02-06 ENCOUNTER — Telehealth: Payer: Self-pay | Admitting: Family Medicine

## 2014-02-06 ENCOUNTER — Other Ambulatory Visit: Payer: Self-pay

## 2014-02-06 MED ORDER — NEBIVOLOL HCL 10 MG PO TABS: 10.0000 mg | ORAL_TABLET | Freq: Every day | ORAL | Status: DC

## 2014-02-06 NOTE — Telephone Encounter (Signed)
Pt request refill of the following: nebivolol (BYSTOLIC) 10 MG tablet   Phamacy:  [facility]

## 2014-02-06 NOTE — Telephone Encounter (Signed)
Rx sent to pharmacy

## 2014-02-09 ENCOUNTER — Ambulatory Visit (HOSPITAL_BASED_OUTPATIENT_CLINIC_OR_DEPARTMENT_OTHER): Payer: Medicare Other

## 2014-02-09 ENCOUNTER — Ambulatory Visit (HOSPITAL_BASED_OUTPATIENT_CLINIC_OR_DEPARTMENT_OTHER): Payer: Medicare Other | Admitting: Nurse Practitioner

## 2014-02-09 ENCOUNTER — Encounter: Payer: Self-pay | Admitting: Nurse Practitioner

## 2014-02-09 VITALS — BP 139/69 | HR 55 | Temp 98.2°F | Resp 18 | Ht 66.0 in | Wt 189.4 lb

## 2014-02-09 DIAGNOSIS — D631 Anemia in chronic kidney disease: Secondary | ICD-10-CM

## 2014-02-09 DIAGNOSIS — R739 Hyperglycemia, unspecified: Secondary | ICD-10-CM | POA: Diagnosis not present

## 2014-02-09 DIAGNOSIS — N181 Chronic kidney disease, stage 1: Secondary | ICD-10-CM

## 2014-02-09 DIAGNOSIS — Z853 Personal history of malignant neoplasm of breast: Secondary | ICD-10-CM

## 2014-02-09 DIAGNOSIS — N189 Chronic kidney disease, unspecified: Secondary | ICD-10-CM

## 2014-02-09 DIAGNOSIS — D72829 Elevated white blood cell count, unspecified: Secondary | ICD-10-CM

## 2014-02-09 DIAGNOSIS — D649 Anemia, unspecified: Secondary | ICD-10-CM

## 2014-02-09 DIAGNOSIS — C50912 Malignant neoplasm of unspecified site of left female breast: Secondary | ICD-10-CM

## 2014-02-09 LAB — CBC & DIFF AND RETIC
BASO%: 0.3 % (ref 0.0–2.0)
BASOS ABS: 0 10*3/uL (ref 0.0–0.1)
EOS ABS: 0.3 10*3/uL (ref 0.0–0.5)
EOS%: 3.1 % (ref 0.0–7.0)
HEMATOCRIT: 28.8 % — AB (ref 34.8–46.6)
HGB: 9.4 g/dL — ABNORMAL LOW (ref 11.6–15.9)
IMMATURE RETIC FRACT: 5.4 % (ref 1.60–10.00)
LYMPH%: 18.4 % (ref 14.0–49.7)
MCH: 28.9 pg (ref 25.1–34.0)
MCHC: 32.6 g/dL (ref 31.5–36.0)
MCV: 88.6 fL (ref 79.5–101.0)
MONO#: 1.1 10*3/uL — AB (ref 0.1–0.9)
MONO%: 12.1 % (ref 0.0–14.0)
NEUT%: 66.1 % (ref 38.4–76.8)
NEUTROS ABS: 5.9 10*3/uL (ref 1.5–6.5)
NRBC: 0 % (ref 0–0)
Platelets: 231 10*3/uL (ref 145–400)
RBC: 3.25 10*6/uL — ABNORMAL LOW (ref 3.70–5.45)
RDW: 14 % (ref 11.2–14.5)
Retic %: 1.76 % (ref 0.70–2.10)
Retic Ct Abs: 57.2 10*3/uL (ref 33.70–90.70)
WBC: 8.9 10*3/uL (ref 3.9–10.3)
lymph#: 1.6 10*3/uL (ref 0.9–3.3)

## 2014-02-09 NOTE — Progress Notes (Signed)
[facility]  [phone] [phone]  OFFICE PROGRESS NOTE   ID: [name] [name]   DOB: 1932-12-17  MR#: [id number]  ONG#:[id number]   PCP: [name], MD GYN: Juanda Chance, WHNP-C SU: Alphonsa Overall, MD RAD ONC: Rexene Edison, MD CARDIO: Peter M.  Martinique, MD PULM: Tarri Fuller D. Annamaria Boots, MD NEPHRO:  Roney Jaffe, MD   HISTORY OF PRESENT ILLNESS: From Dr. Collier Salina Rubin's new patient evaluation note dated 10/01/2005: "This is a 80 year old woman referred by Dr. Lucia Gaskins for evaluation and treatment of breast cancer.  This woman undergoes annual screening mammography.  She underwent initial mammogram on 08/06/2005.  This showed a suspicious mass in the left breast.  A diagnostic mammogram performed on 08/19/2005 showed a 5 x 4 x 4 mm spiculated mass with microcalfication of the left anterior upper outer quadrant at 1 o'clock.  No palpable mass was identified.  Ultrasound showed hypoechoic mass with some shadowing and microcalcifications.  Biopsy was performed on 08/19/2005 and this showed an invasive mammary carcinoma intermediate grade, ER/PR positive at 94% and 86% respectively, proliferative index 9%, HER-2/neu was 2+ with no overexpression by FISH.  No polysomy was seen with adjuvant in her chromosome 17 of 3.  MRI scan of both breasts was performed showing a solitary abnormality in the left breast and right breast was normal.  The patient elected to undergo lumpectomy and sentinel lymph node evaluation on 09/09/2005.  The final pathology showed a 0.9 cm grade 3 of 3 invasive ductal cancer.  Lymphovascular invasion was not seen though lymphovascular invasion was identified in the original biopsy.  The solitary lymph node, which was identified, was negative for malignancy.  She has had an unremarkable postoperative course."  Her subsequent history is as detailed below.   INTERVAL HISTORY: [name] [name] returns today with her husband [name] [name] for follow up of her breast  cancer. The interval history is generally unremarkable. She will likely have surgery next month to repair an abdominal hernia. 80 year old woman continues and hits her at unpredictable moments sometimes. She has valium on hand but rarely has to use it. She is sitting in wheelchair today, but uses a cane or walker at home. She continues to cook and do house work.    REVIEW OF SYSTEMS: Aisia denies fevers, chills, or changes in bowel or bladder habits. She has some nausea associated with the vertigo. She has an irregular heartbeat, but denies shortness of breath, chest pain, cough, or worsening fatigue. She bruises easily. Her bilateral lower extremities swell occasionally. Her diabetes is well controlled. A detailed review of systems is otherwise noncontributory.   PAST MEDICAL HISTORY: Past Medical History  Diagnosis Date  . HYPOTHYROIDISM 07/24/2008  . HYPERLIPIDEMIA 07/24/2008  . DEPRESSION 05/16/2009  . HYPERTENSION 07/24/2008  . Vertigo   . Diabetes mellitus type II   . Arthritis     r tkr  . CVA (cerebral vascular accident) 2008    mini stroke.no residual  . Heart murmur     stress test 2009.dr peter Martinique  . Intermittent vertigo   . Skin abnormality     facial lesions .pt applying mupiracin to areas  . Atrial fibrillation   . Breast cancer   . Pneumonia   . Wegener's disease, pulmonary 10/2012    PAST SURGICAL HISTORY: Past Surgical History  Procedure Laterality Date  . Knee surgery  2009    TKR  . Cholecystectomy  1980  . Joint replacement      r knee  .  Breast surgery  2007    Lumpectomy, XRT 2006.l breast  . Video assisted thoracoscopy  04/22/2011    Procedure: VIDEO ASSISTED THORACOSCOPY;  Surgeon: Melrose Nakayama, MD;  Location: [facility];  Service: Thoracic;  Laterality: Left;  WITH BIOPSY  . Heel spur surgery Right   . Exploratory laparotomy       FAMILY HISTORY Family History  Problem Relation Age of Onset  . Arthritis Mother   . Rheum arthritis Mother   . Heart  disease Father   . Coronary artery disease Father   . Cancer Sister     breast CA, both sisters  . Breast cancer Sister   . Coronary artery disease Brother   . Lung cancer Brother   Both parents both deceased.  She had one brother who died of lung cancer and another brother who died from pancreatic cancer.   She has had two sisters with breast cancer, one dying at age 59.  One sister was diagnosed at age 39 and it recurred 59 years later.    GYNECOLOGIC HISTORY: Gravida 3, para 3.    Menarche age 56 and menopause in her 68s.  She was on hormone replacement therapy with Premarin, Provera for 15-20 years.  SOCIAL HISTORY: Mr. and Mrs. Gravois have been married since 62.  They have 3 adult sons, 7 grandchildren, and 6 great-grandchildren.   She has retired from [employer] as a Administrator.  Her husband is also retired from [employer] in 1996. In her spare time the patient enjoys cooking/baking and reading.  They have a toy poodle named "[employer]    ADVANCED DIRECTIVES:  in place   HEALTH MAINTENANCE: History  Substance Use Topics  . Smoking status: Former Smoker -- 0.50 packs/day for 12 years    Types: Cigarettes    Quit date: 06/04/1982  . Smokeless tobacco: Never Used  . Alcohol Use: No    Colonoscopy: 05/05/2003 PAP:  Bone density: The patient's last bone density scan on 10/16/2011 showed a T score of -1.8 (osteopenia). Lipid panel:    Allergies  Allergen Reactions  . Codeine Sulfate Other (See Comments)    REACTION: GI upset  . Sulfonamide Derivatives Other (See Comments)    REACTION: GI upset  . Atarax [Hydroxyzine] Nausea Only and Rash    Current Outpatient Prescriptions  Medication Sig Dispense Refill  . acetaminophen (TYLENOL) 500 MG tablet Take 500 mg by mouth every 6 (six) hours as needed.    Marland Kitchen amLODipine (NORVASC) 5 MG tablet Take 1 tablet (5 mg total) by mouth daily. 30 tablet 5  . apixaban (ELIQUIS) 5 MG TABS tablet Take 1  tablet (5 mg total) by mouth 2 (two) times daily. 180 tablet 3  . CALCIUM CITRATE PO Take 1,200 mg by mouth daily.      . cholecalciferol (VITAMIN D) 1000 UNITS tablet Take 3,000 Units by mouth daily.      . diazepam (VALIUM) 5 MG tablet Take 1 tablet (5 mg total) by mouth every 12 (twelve) hours as needed for anxiety. 30 tablet 1  . furosemide (LASIX) 20 MG tablet Take 1 tablet (20 mg total) by mouth daily. 90 tablet 3  . gabapentin (NEURONTIN) 100 MG capsule Take 1 capsule (100 mg total) by mouth 2 (two) times daily before a meal. 60 capsule 6  . glimepiride (AMARYL) 2 MG tablet Take 1 tablet (2 mg total) by mouth daily before breakfast. 30 tablet 11  . levothyroxine (SYNTHROID, LEVOTHROID)  112 MCG tablet Take 1 tablet (112 mcg total) by mouth daily before breakfast. 90 tablet 0  . loratadine (CLARITIN) 10 MG tablet Take 10 mg by mouth daily as needed for allergies.    Marland Kitchen nebivolol (BYSTOLIC) 10 MG tablet Take 1 tablet (10 mg total) by mouth daily after breakfast. 30 tablet 5  . predniSONE (DELTASONE) 20 MG tablet Alternate between 1/2 tab and 1/4 tab every other day    . pyridOXINE (VITAMIN B-6) 100 MG tablet Take 100 mg by mouth every morning.    . rosuvastatin (CRESTOR) 5 MG tablet Take 1 tablet (5 mg total) by mouth daily. 90 tablet 3  . valsartan (DIOVAN) 160 MG tablet Take 1 tablet (160 mg total) by mouth daily. 30 tablet 5  . albuterol (PROVENTIL) (2.5 MG/3ML) 0.083% nebulizer solution Take 2.5 mg by nebulization every 6 (six) hours as needed for wheezing or shortness of breath.    . Insulin Glargine (LANTUS SOLOSTAR) 100 UNIT/ML Solostar Pen Inject 10 Units into the skin daily. 15 mL 5   No current facility-administered medications for this visit.    OBJECTIVE: Older white woman examined in a wheelchair Filed Vitals:   02/09/14 1035  BP: 139/69  Pulse: 55  Temp: 98.2 F (36.8 C)  Resp: 18     Body mass index is 30.58 kg/(m^2).       ECOG FS: 2 - Symptomatic, <50% confined to  bed  Skin: warm, dry  HEENT: sclerae anicteric, conjunctivae pink, oropharynx clear. No thrush or mucositis.  Lymph Nodes: No cervical or supraclavicular lymphadenopathy  Lungs: clear to auscultation bilaterally, no rales, wheezes, or rhonci  Heart:irregular heart rate. No rubs or murmurs.  Abdomen: round, soft, non tender, positive bowel sounds  Musculoskeletal: No focal spinal tenderness, no peripheral edema  Neuro: non focal, well oriented, positive affect  Breasts: left breast status post lumpectomy and radiation. No skin or nipple changes. No evidence of local recurrence. Left axilla being. Right breast unremarkable.    LAB RESULTS: Lab Results  Component Value Date   WBC 8.9 02/09/2014   NEUTROABS 5.9 02/09/2014   HGB 9.4* 02/09/2014   HCT 28.8* 02/09/2014   MCV 88.6 02/09/2014   PLT 231 02/09/2014      Chemistry      Component Value Date/Time   NA 128* 01/16/2014 1121   NA 131* 02/08/2013 0945   NA 136 09/09/2011 0939   K 4.5 01/16/2014 1121   K 5.0 02/08/2013 0945   K 4.7 09/09/2011 0939   CL 96 01/16/2014 1121   CL 98 08/02/2012 0824   CL 95* 09/09/2011 0939   CO2 22 01/16/2014 1121   CO2 21* 02/08/2013 0945   CO2 29 09/09/2011 0939   BUN 23 01/16/2014 1121   BUN 29.7* 02/08/2013 0945   BUN 17 09/09/2011 0939   CREATININE 1.8* 01/16/2014 1121   CREATININE 1.8* 02/08/2013 0945   CREATININE 1.0 09/09/2011 0939      Component Value Date/Time   CALCIUM 8.8 01/16/2014 1121   CALCIUM 9.0 02/08/2013 0945   CALCIUM 9.0 09/09/2011 0939   ALKPHOS 58 01/16/2014 1121   ALKPHOS 35* 02/08/2013 0945   ALKPHOS 45 09/09/2011 0939   AST 22 01/16/2014 1121   AST 16 02/08/2013 0945   AST 26 09/09/2011 0939   ALT 12 01/16/2014 1121   ALT 15 02/08/2013 0945   ALT 19 09/09/2011 0939   BILITOT 0.8 01/16/2014 1121   BILITOT 0.68 02/08/2013 0945   BILITOT 0.60 09/09/2011  0939      Lab Results  Component Value Date   LABCA2 28 09/09/2011    Urinalysis     Component Value Date/Time   COLORURINE YELLOW 08/18/2013 1224   APPEARANCEUR CLEAR 08/18/2013 1224   LABSPEC 1.009 08/18/2013 1224   PHURINE 6.0 08/18/2013 1224   GLUCOSEU NEGATIVE 08/18/2013 1224   HGBUR MODERATE* 08/18/2013 1224   BILIRUBINUR NEGATIVE 08/18/2013 1224   BILIRUBINUR n 12/30/2010 1700   KETONESUR NEGATIVE 08/18/2013 1224   PROTEINUR NEGATIVE 08/18/2013 1224   PROTEINUR 2+ 12/30/2010 1700   UROBILINOGEN 0.2 08/18/2013 1224   UROBILINOGEN 0.2 12/30/2010 1700   NITRITE NEGATIVE 08/18/2013 1224   NITRITE n 12/30/2010 1700   LEUKOCYTESUR MODERATE* 08/18/2013 1224    STUDIES: Most recent mammogram on 06/20/13 was unremarkable.   ASSESSMENT: Mrs. Ireland is a 80 y.o. [place], [place] woman:  1.  Status post left breast lumpectomy with left axillary sentinel node biopsy on 09/09/2005 for a pT1b pN0, stagfe IA invasive ductal carcinoma, grade 3,  estrogen receptor 94% positive, progesterone receptor 86% positive, Ki-67 at 9%, HER-2/neu not amplified by FISH   2.  Oncotype DX report showed a recurrent score of 19 with average rate of distant recurrence of 12% at 10 years, if her only systemic treatment was tamoxifen for 5 years  3.  Status post radiation therapy from 11/11/2005 through 12/26/2005.  4.  The patient started antiestrogen therapy with Arimidex in 01/2006.  The patient also started Fosamax at that time.  She completed antiestrogen therapy in 01/2011.  5. left upper lobe lung biopsy 04/22/2011 showed a small area of atypical glands representing a well-differentiated/low grade neuroendocrine tumor.  Followup by CT  in July of 2014 showed nonspecific findings, with no obvious progression  6.  Hospitalization from 11/01/2012 through 11/07/2012 for pneumonia and granulomatous polyangiitis (Wegener's disease).  7.  Steroid-induced hyperglycemia and leukocytosis.  8. Chronic kidney disease  9. Anemia of chronic renal failure  PLAN: Dannae is doing well  as far as her breast cancer is concerned. She is now 8 years out from her definitive surgery with no evidence of recurrent disease. The labs were reviewed in detail and her anemia continues, but not as severe. Her hgb was 9.4 today. As discussed in Dr. Virgie Dad previous progress note, this is likely the result of her renal insufficiency and low erythropoietin level. The patient continues to be asymptomatic.   We discussed "graduating" her from observation visits at this clinic, and she agreed.  She will continue with yearly mammograms and clinical breast exams as advised by Dr. Elease Hashimoto, her PCP, who will resume responsibility for her care. Lamont understands and agrees with this plan. She has been encouraged to call with any issues that might arise.   Marcelino Duster, NP

## 2014-03-08 ENCOUNTER — Telehealth: Payer: Self-pay

## 2014-03-08 MED ORDER — PREDNISONE 20 MG PO TABS: ORAL_TABLET | ORAL | Status: DC

## 2014-03-08 NOTE — Telephone Encounter (Signed)
Is it okay to change the directions pt is already taking prednisone 25m.

## 2014-03-08 NOTE — Telephone Encounter (Signed)
Rx sent to pharmacy

## 2014-03-08 NOTE — Telephone Encounter (Signed)
Pulmonary is prescribing her prednisone

## 2014-03-08 NOTE — Telephone Encounter (Signed)
Rx request for Prednisone 20 mg- Take 3 tablets by mouth once daily for 2 days then 2 tablets once daily for 3 days then 1 tablet once daily for 3 days, then resume regular schedule. #15.  Pls advise.

## 2014-03-21 ENCOUNTER — Telehealth: Payer: Self-pay | Admitting: Cardiology

## 2014-03-21 NOTE — Telephone Encounter (Signed)
Received records from [facility] (Dr Alphonsa Overall) for appointment with Dr Mikle Bosworth on 05/26/14.  Records given to [facility] (Medical records) for Dr Doug Sou schedule on 05/26/14.  lp

## 2014-04-03 ENCOUNTER — Telehealth: Payer: Self-pay | Admitting: Family Medicine

## 2014-04-03 NOTE — Telephone Encounter (Signed)
With her chronic lung disease she really needs to be seen.  If she has any increased shortness of breath, nausea/vomiting, confusion, etc should not wait until tomorrow.

## 2014-04-03 NOTE — Telephone Encounter (Signed)
Pt has cough, upper resp, fever (102) and would like to know if dr burchette will send in antibiotic to [facility]

## 2014-04-03 NOTE — Telephone Encounter (Signed)
Pt informed. Per Dr. Jacinto Reap.

## 2014-04-04 ENCOUNTER — Ambulatory Visit: Payer: Medicare Other | Admitting: Family Medicine

## 2014-04-04 ENCOUNTER — Encounter (HOSPITAL_COMMUNITY): Payer: Self-pay | Admitting: *Deleted

## 2014-04-04 ENCOUNTER — Inpatient Hospital Stay (HOSPITAL_COMMUNITY)
Admission: EM | Admit: 2014-04-04 | Discharge: 2014-04-06 | DRG: 189 | Disposition: A | Discharge status: Home / Self Care | Payer: Medicare Other | Attending: Internal Medicine | Admitting: Internal Medicine

## 2014-04-04 ENCOUNTER — Emergency Department (HOSPITAL_COMMUNITY): Payer: Medicare Other

## 2014-04-04 DIAGNOSIS — J40 Bronchitis, not specified as acute or chronic: Secondary | ICD-10-CM | POA: Diagnosis present

## 2014-04-04 DIAGNOSIS — I129 Hypertensive chronic kidney disease with stage 1 through stage 4 chronic kidney disease, or unspecified chronic kidney disease: Secondary | ICD-10-CM | POA: Diagnosis present

## 2014-04-04 DIAGNOSIS — E785 Hyperlipidemia, unspecified: Secondary | ICD-10-CM | POA: Diagnosis present

## 2014-04-04 DIAGNOSIS — J189 Pneumonia, unspecified organism: Secondary | ICD-10-CM | POA: Diagnosis present

## 2014-04-04 DIAGNOSIS — Z6829 Body mass index (BMI) 29.0-29.9, adult: Secondary | ICD-10-CM | POA: Diagnosis not present

## 2014-04-04 DIAGNOSIS — E119 Type 2 diabetes mellitus without complications: Secondary | ICD-10-CM | POA: Diagnosis present

## 2014-04-04 DIAGNOSIS — Z8673 Personal history of transient ischemic attack (TIA), and cerebral infarction without residual deficits: Secondary | ICD-10-CM

## 2014-04-04 DIAGNOSIS — E871 Hypo-osmolality and hyponatremia: Secondary | ICD-10-CM | POA: Diagnosis present

## 2014-04-04 DIAGNOSIS — R634 Abnormal weight loss: Secondary | ICD-10-CM | POA: Diagnosis present

## 2014-04-04 DIAGNOSIS — M1990 Unspecified osteoarthritis, unspecified site: Secondary | ICD-10-CM | POA: Diagnosis present

## 2014-04-04 DIAGNOSIS — M3130 Wegener's granulomatosis without renal involvement: Secondary | ICD-10-CM | POA: Diagnosis present

## 2014-04-04 DIAGNOSIS — I5032 Chronic diastolic (congestive) heart failure: Secondary | ICD-10-CM | POA: Diagnosis present

## 2014-04-04 DIAGNOSIS — Z885 Allergy status to narcotic agent status: Secondary | ICD-10-CM | POA: Diagnosis not present

## 2014-04-04 DIAGNOSIS — J9621 Acute and chronic respiratory failure with hypoxia: Principal | ICD-10-CM | POA: Diagnosis present

## 2014-04-04 DIAGNOSIS — I482 Chronic atrial fibrillation, unspecified: Secondary | ICD-10-CM

## 2014-04-04 DIAGNOSIS — I517 Cardiomegaly: Secondary | ICD-10-CM | POA: Diagnosis not present

## 2014-04-04 DIAGNOSIS — N183 Chronic kidney disease, stage 3 unspecified: Secondary | ICD-10-CM

## 2014-04-04 DIAGNOSIS — M300 Polyarteritis nodosa: Secondary | ICD-10-CM | POA: Diagnosis present

## 2014-04-04 DIAGNOSIS — Z882 Allergy status to sulfonamides status: Secondary | ICD-10-CM | POA: Diagnosis not present

## 2014-04-04 DIAGNOSIS — F329 Major depressive disorder, single episode, unspecified: Secondary | ICD-10-CM | POA: Diagnosis present

## 2014-04-04 DIAGNOSIS — R0600 Dyspnea, unspecified: Secondary | ICD-10-CM | POA: Diagnosis not present

## 2014-04-04 DIAGNOSIS — R0602 Shortness of breath: Secondary | ICD-10-CM | POA: Diagnosis not present

## 2014-04-04 DIAGNOSIS — Z853 Personal history of malignant neoplasm of breast: Secondary | ICD-10-CM

## 2014-04-04 DIAGNOSIS — J9620 Acute and chronic respiratory failure, unspecified whether with hypoxia or hypercapnia: Secondary | ICD-10-CM | POA: Diagnosis not present

## 2014-04-04 DIAGNOSIS — R7881 Bacteremia: Secondary | ICD-10-CM | POA: Diagnosis present

## 2014-04-04 DIAGNOSIS — Z87891 Personal history of nicotine dependence: Secondary | ICD-10-CM | POA: Diagnosis not present

## 2014-04-04 DIAGNOSIS — E039 Hypothyroidism, unspecified: Secondary | ICD-10-CM | POA: Diagnosis present

## 2014-04-04 LAB — COMPREHENSIVE METABOLIC PANEL
ALBUMIN: 3.6 g/dL (ref 3.5–5.2)
ALT: 11 U/L (ref 0–35)
AST: 26 U/L (ref 0–37)
Alkaline Phosphatase: 66 U/L (ref 39–117)
Anion gap: 12 (ref 5–15)
BUN: 22 mg/dL (ref 6–23)
CHLORIDE: 93 meq/L — AB (ref 96–112)
CO2: 22 mmol/L (ref 19–32)
CREATININE: 1.6 mg/dL — AB (ref 0.50–1.10)
Calcium: 8.4 mg/dL (ref 8.4–10.5)
GFR calc Af Amer: 34 mL/min — ABNORMAL LOW (ref >90)
GFR, EST NON AFRICAN AMERICAN: 30 mL/min — AB (ref >90)
Glucose, Bld: 108 mg/dL — ABNORMAL HIGH (ref 70–99)
Potassium: 4 mmol/L (ref 3.5–5.1)
SODIUM: 127 mmol/L — AB (ref 135–145)
Total Bilirubin: 0.8 mg/dL (ref 0.3–1.2)
Total Protein: 6.6 g/dL (ref 6.0–8.3)

## 2014-04-04 LAB — I-STAT CG4 LACTIC ACID, ED: LACTIC ACID, VENOUS: 1.16 mmol/L (ref 0.5–2.2)

## 2014-04-04 LAB — URINE MICROSCOPIC-ADD ON

## 2014-04-04 LAB — URINALYSIS, ROUTINE W REFLEX MICROSCOPIC
BILIRUBIN URINE: NEGATIVE
GLUCOSE, UA: NEGATIVE mg/dL
KETONES UR: NEGATIVE mg/dL
NITRITE: NEGATIVE
PH: 6.5 (ref 5.0–8.0)
Protein, ur: 30 mg/dL — AB
SPECIFIC GRAVITY, URINE: 1.011 (ref 1.005–1.030)
Urobilinogen, UA: 0.2 mg/dL (ref 0.0–1.0)

## 2014-04-04 LAB — CBC WITH DIFFERENTIAL/PLATELET
BASOS ABS: 0 10*3/uL (ref 0.0–0.1)
BASOS PCT: 0 % (ref 0–1)
Eosinophils Absolute: 0.1 10*3/uL (ref 0.0–0.7)
Eosinophils Relative: 1 % (ref 0–5)
HCT: 28.3 % — ABNORMAL LOW (ref 36.0–46.0)
Hemoglobin: 9.3 g/dL — ABNORMAL LOW (ref 12.0–15.0)
Lymphocytes Relative: 26 % (ref 12–46)
Lymphs Abs: 2 10*3/uL (ref 0.7–4.0)
MCH: 29.5 pg (ref 26.0–34.0)
MCHC: 32.9 g/dL (ref 30.0–36.0)
MCV: 89.8 fL (ref 78.0–100.0)
MONO ABS: 1.3 10*3/uL — AB (ref 0.1–1.0)
Monocytes Relative: 17 % — ABNORMAL HIGH (ref 3–12)
NEUTROS ABS: 4.3 10*3/uL (ref 1.7–7.7)
Neutrophils Relative %: 56 % (ref 43–77)
PLATELETS: 225 10*3/uL (ref 150–400)
RBC: 3.15 MIL/uL — ABNORMAL LOW (ref 3.87–5.11)
RDW: 15 % (ref 11.5–15.5)
WBC: 7.7 10*3/uL (ref 4.0–10.5)

## 2014-04-04 LAB — BRAIN NATRIURETIC PEPTIDE: B Natriuretic Peptide: 661.3 pg/mL — ABNORMAL HIGH (ref 0.0–100.0)

## 2014-04-04 LAB — TROPONIN I: TROPONIN I: 0.03 ng/mL (ref <0.031)

## 2014-04-04 LAB — GLUCOSE, CAPILLARY
GLUCOSE-CAPILLARY: 103 mg/dL — AB (ref 70–99)
GLUCOSE-CAPILLARY: 231 mg/dL — AB (ref 70–99)

## 2014-04-04 MED ORDER — AMLODIPINE BESYLATE 5 MG PO TABS
5.0000 mg | ORAL_TABLET | Freq: Every day | ORAL | Status: DC
  Administered 2014-04-04 – 2014-04-06 (×3): 5 mg via ORAL
  Filled 2014-04-04 (×3): qty 1

## 2014-04-04 MED ORDER — ALBUTEROL SULFATE (2.5 MG/3ML) 0.083% IN NEBU
5.0000 mg | INHALATION_SOLUTION | Freq: Once | RESPIRATORY_TRACT | Status: AC
Start: 2014-04-04 — End: 2014-04-04
  Administered 2014-04-04: 5 mg via RESPIRATORY_TRACT
  Filled 2014-04-04: qty 6

## 2014-04-04 MED ORDER — ROSUVASTATIN CALCIUM 5 MG PO TABS
5.0000 mg | ORAL_TABLET | Freq: Every day | ORAL | Status: DC
  Administered 2014-04-04 – 2014-04-06 (×3): 5 mg via ORAL
  Filled 2014-04-04 (×3): qty 1

## 2014-04-04 MED ORDER — PREDNISONE 10 MG PO TABS
10.0000 mg | ORAL_TABLET | ORAL | Status: DC
  Administered 2014-04-05: 10 mg via ORAL
  Filled 2014-04-04: qty 1

## 2014-04-04 MED ORDER — DIAZEPAM 5 MG PO TABS: 5.0000 mg | ORAL_TABLET | Freq: Two times a day (BID) | ORAL | Status: DC | PRN

## 2014-04-04 MED ORDER — APIXABAN 5 MG PO TABS
5.0000 mg | ORAL_TABLET | Freq: Two times a day (BID) | ORAL | Status: DC
  Administered 2014-04-04 – 2014-04-06 (×4): 5 mg via ORAL
  Filled 2014-04-04 (×5): qty 1

## 2014-04-04 MED ORDER — VITAMIN B-6 100 MG PO TABS
100.0000 mg | ORAL_TABLET | Freq: Every morning | ORAL | Status: DC
  Administered 2014-04-05 – 2014-04-06 (×2): 100 mg via ORAL
  Filled 2014-04-04 (×2): qty 1

## 2014-04-04 MED ORDER — IPRATROPIUM-ALBUTEROL 0.5-2.5 (3) MG/3ML IN SOLN
3.0000 mL | RESPIRATORY_TRACT | Status: DC
  Administered 2014-04-04 – 2014-04-05 (×2): 3 mL via RESPIRATORY_TRACT
  Filled 2014-04-04 (×3): qty 3

## 2014-04-04 MED ORDER — SODIUM CHLORIDE 0.9 % IV SOLN
INTRAVENOUS | Status: DC
  Administered 2014-04-04 – 2014-04-05 (×2): via INTRAVENOUS

## 2014-04-04 MED ORDER — IPRATROPIUM-ALBUTEROL 0.5-2.5 (3) MG/3ML IN SOLN
3.0000 mL | Freq: Once | RESPIRATORY_TRACT | Status: AC
  Administered 2014-04-04: 3 mL via RESPIRATORY_TRACT
  Filled 2014-04-04: qty 3

## 2014-04-04 MED ORDER — SODIUM CHLORIDE 0.9 % IV BOLUS (SEPSIS)
1000.0000 mL | Freq: Once | INTRAVENOUS | Status: AC
  Administered 2014-04-04: 1000 mL via INTRAVENOUS

## 2014-04-04 MED ORDER — GABAPENTIN 100 MG PO CAPS
100.0000 mg | ORAL_CAPSULE | Freq: Two times a day (BID) | ORAL | Status: DC
  Administered 2014-04-04 – 2014-04-06 (×5): 100 mg via ORAL
  Filled 2014-04-04 (×5): qty 1

## 2014-04-04 MED ORDER — METHYLPREDNISOLONE SODIUM SUCC 40 MG IJ SOLR
40.0000 mg | Freq: Once | INTRAMUSCULAR | Status: AC
  Administered 2014-04-04: 40 mg via INTRAVENOUS
  Filled 2014-04-04: qty 1

## 2014-04-04 MED ORDER — DEXTROSE 5 % IV SOLN
1.0000 g | INTRAVENOUS | Status: DC
  Administered 2014-04-05 – 2014-04-06 (×2): 1 g via INTRAVENOUS
  Filled 2014-04-04 (×2): qty 10

## 2014-04-04 MED ORDER — SODIUM CHLORIDE 0.9 % IJ SOLN
3.0000 mL | Freq: Two times a day (BID) | INTRAMUSCULAR | Status: DC
  Administered 2014-04-05: 3 mL via INTRAVENOUS

## 2014-04-04 MED ORDER — CALCIUM CITRATE 950 (200 CA) MG PO TABS
2.0000 | ORAL_TABLET | Freq: Every day | ORAL | Status: DC
  Administered 2014-04-04 – 2014-04-06 (×3): 400 mg via ORAL
  Filled 2014-04-04 (×3): qty 2

## 2014-04-04 MED ORDER — AZITHROMYCIN 500 MG IV SOLR
500.0000 mg | Freq: Once | INTRAVENOUS | Status: AC
  Administered 2014-04-04: 500 mg via INTRAVENOUS
  Filled 2014-04-04: qty 500

## 2014-04-04 MED ORDER — ACETAMINOPHEN 650 MG RE SUPP: 650.0000 mg | Freq: Four times a day (QID) | RECTAL | Status: DC | PRN

## 2014-04-04 MED ORDER — INSULIN ASPART 100 UNIT/ML ~~LOC~~ SOLN
0.0000 [IU] | Freq: Three times a day (TID) | SUBCUTANEOUS | Status: DC
  Administered 2014-04-05 (×2): 3 [IU] via SUBCUTANEOUS
  Administered 2014-04-05 – 2014-04-06 (×2): 2 [IU] via SUBCUTANEOUS
  Administered 2014-04-06: 1 [IU] via SUBCUTANEOUS

## 2014-04-04 MED ORDER — LORATADINE 10 MG PO TABS
10.0000 mg | ORAL_TABLET | Freq: Every day | ORAL | Status: DC | PRN
  Filled 2014-04-04: qty 1

## 2014-04-04 MED ORDER — NEBIVOLOL HCL 10 MG PO TABS
10.0000 mg | ORAL_TABLET | Freq: Every day | ORAL | Status: DC
  Administered 2014-04-05 – 2014-04-06 (×2): 10 mg via ORAL
  Filled 2014-04-04 (×2): qty 1

## 2014-04-04 MED ORDER — ONDANSETRON HCL 4 MG/2ML IJ SOLN: 4.0000 mg | Freq: Four times a day (QID) | INTRAMUSCULAR | Status: DC | PRN

## 2014-04-04 MED ORDER — VITAMIN D3 25 MCG (1000 UNIT) PO TABS
3000.0000 [IU] | ORAL_TABLET | Freq: Every day | ORAL | Status: DC
  Administered 2014-04-04 – 2014-04-06 (×3): 3000 [IU] via ORAL
  Filled 2014-04-04 (×3): qty 3

## 2014-04-04 MED ORDER — CEFTRIAXONE SODIUM 1 G IJ SOLR
1.0000 g | Freq: Once | INTRAMUSCULAR | Status: AC
  Administered 2014-04-04: 1 g via INTRAVENOUS
  Filled 2014-04-04: qty 10

## 2014-04-04 MED ORDER — PREDNISONE 5 MG PO TABS
5.0000 mg | ORAL_TABLET | ORAL | Status: DC
  Administered 2014-04-06: 5 mg via ORAL
  Filled 2014-04-04: qty 1

## 2014-04-04 MED ORDER — DEXTROSE 5 % IV SOLN
500.0000 mg | INTRAVENOUS | Status: DC
  Administered 2014-04-05 – 2014-04-06 (×2): 500 mg via INTRAVENOUS
  Filled 2014-04-04 (×2): qty 500

## 2014-04-04 MED ORDER — LEVOTHYROXINE SODIUM 112 MCG PO TABS
112.0000 ug | ORAL_TABLET | Freq: Every day | ORAL | Status: DC
  Administered 2014-04-05 – 2014-04-06 (×2): 112 ug via ORAL
  Filled 2014-04-04 (×2): qty 1

## 2014-04-04 MED ORDER — ENSURE COMPLETE PO LIQD: 237.0000 mL | Freq: Two times a day (BID) | ORAL | Status: DC

## 2014-04-04 MED ORDER — INSULIN GLARGINE 100 UNIT/ML ~~LOC~~ SOLN
5.0000 [IU] | Freq: Every day | SUBCUTANEOUS | Status: DC
  Administered 2014-04-04 – 2014-04-05 (×2): 5 [IU] via SUBCUTANEOUS
  Filled 2014-04-04 (×3): qty 0.05

## 2014-04-04 MED ORDER — ONDANSETRON HCL 4 MG PO TABS: 4.0000 mg | ORAL_TABLET | Freq: Four times a day (QID) | ORAL | Status: DC | PRN

## 2014-04-04 MED ORDER — ACETAMINOPHEN 325 MG PO TABS: 650.0000 mg | ORAL_TABLET | Freq: Four times a day (QID) | ORAL | Status: DC | PRN

## 2014-04-04 NOTE — ED Notes (Signed)
Patient has had productive cough, fever and shortness of breath for the past 3 days. Patient has history of pneumonia.

## 2014-04-04 NOTE — ED Notes (Signed)
Oxygen saturation as low as 86 with ambulation on room air. Patient short of breath with activity.

## 2014-04-04 NOTE — ED Notes (Signed)
Patient transported to X-ray

## 2014-04-04 NOTE — ED Notes (Signed)
Family at bedside. 

## 2014-04-04 NOTE — ED Notes (Signed)
MD at bedside.

## 2014-04-04 NOTE — ED Provider Notes (Signed)
CSN: [id number]     Arrival date & time 04/04/14  1517 History   First MD Initiated Contact with Patient 04/04/14 [phone]     Chief Complaint  Patient presents with  . Cough  . Shortness of Breath  . Fever     (Consider location/radiation/quality/duration/timing/severity/associated sxs/prior Treatment) Patient is a 80 y.o. female presenting with cough.  Cough Cough characteristics:  Productive Sputum characteristics:  Green Severity:  Moderate Onset quality:  Gradual Duration:  3 days Timing:  Constant Progression:  Worsening Chronicity:  Recurrent (similar to episode of pneumonia earlier this year) Relieved by:  Nothing Worsened by:  Nothing tried Associated symptoms: fever (102), shortness of breath (worse with walking), sinus congestion and sore throat   Associated symptoms comment:  Generalized weakness   Past Medical History  Diagnosis Date  . HYPOTHYROIDISM 07/24/2008  . HYPERLIPIDEMIA 07/24/2008  . DEPRESSION 05/16/2009  . HYPERTENSION 07/24/2008  . Vertigo   . Diabetes mellitus type II   . Arthritis     r tkr  . CVA (cerebral vascular accident) 2008    mini stroke.no residual  . Heart murmur     stress test 2009.dr peter [place]  . Intermittent vertigo   . Skin abnormality     facial lesions .pt applying mupiracin to areas  . Atrial fibrillation   . Breast cancer   . Pneumonia   . 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 Knee surgery  2009    TKR  . Cholecystectomy  1980  . Joint replacement      r knee  . Breast surgery  2007    Lumpectomy, XRT 2006.l breast  . Video assisted thoracoscopy  04/22/2011    Procedure: VIDEO ASSISTED THORACOSCOPY;  Surgeon: Melrose Nakayama, MD;  Location: [facility];  Service: Thoracic;  Laterality: Left;  WITH BIOPSY  . Heel spur surgery Right   . Exploratory laparotomy     Family History  Problem Relation Age of Onset  . Arthritis Mother   . Rheum arthritis Mother   .  Heart disease Father   . Coronary artery disease Father   . Cancer Sister     breast CA, both sisters  . Breast cancer Sister   . Coronary artery disease Brother   . Lung cancer Brother    History  Substance Use Topics  . Smoking status: Former Smoker -- 0.50 packs/day for 12 years    Types: Cigarettes    Quit date: 06/04/1982  . Smokeless tobacco: Never Used  . Alcohol Use: No   OB History    No data available     Review of Systems  Constitutional: Positive for fever (102).  HENT: Positive for sore throat.   Respiratory: Positive for cough and shortness of breath (worse with walking).   All other systems reviewed and are negative.     Allergies  Codeine sulfate; Sulfonamide derivatives; and Atarax  Home Medications   Prior to Admission medications   Medication Sig Start Date End Date Taking? Authorizing Provider  acetaminophen (TYLENOL) 500 MG tablet Take 500 mg by mouth every 6 (six) hours as needed.    Historical Provider, MD  albuterol (PROVENTIL) (2.5 MG/3ML) 0.083% nebulizer solution Take 2.5 mg by nebulization every 6 (six) hours as needed for wheezing or shortness of breath.    Historical Provider, MD  amLODipine (NORVASC) 5 MG tablet Take 1 tablet (5 mg total) by mouth daily. 12/19/13   Alinda Sierras  Burchette, MD  apixaban (ELIQUIS) 5 MG TABS tablet Take 1 tablet (5 mg total) by mouth 2 (two) times daily. 01/03/14   [name], MD  CALCIUM CITRATE PO Take 1,200 mg by mouth daily.      Historical Provider, MD  cholecalciferol (VITAMIN D) 1000 UNITS tablet Take 3,000 Units by mouth daily.      Historical Provider, MD  diazepam (VALIUM) 5 MG tablet Take 1 tablet (5 mg total) by mouth every 12 (twelve) hours as needed for anxiety. 08/25/13   [name], MD  furosemide (LASIX) 20 MG tablet Take 1 tablet (20 mg total) by mouth daily. 04/28/13   [name], MD  gabapentin (NEURONTIN) 100 MG capsule Take 1 capsule (100 mg total) by mouth 2 (two) times daily  before a meal. 11/08/12   Erick Colace, NP  glimepiride (AMARYL) 2 MG tablet Take 1 tablet (2 mg total) by mouth daily before breakfast. 07/26/13   [name], MD  Insulin Glargine (LANTUS SOLOSTAR) 100 UNIT/ML Solostar Pen Inject 10 Units into the skin daily. 01/03/14   [name], MD  levothyroxine (SYNTHROID, LEVOTHROID) 112 MCG tablet Take 1 tablet (112 mcg total) by mouth daily before breakfast. 01/17/14   [name], MD  loratadine (CLARITIN) 10 MG tablet Take 10 mg by mouth daily as needed for allergies.    Historical Provider, MD  nebivolol (BYSTOLIC) 10 MG tablet Take 1 tablet (10 mg total) by mouth daily after breakfast. 02/06/14   [name], MD  predniSONE (DELTASONE) 20 MG tablet Alternate between 1/2 tab and 1/4 tab every other day 03/08/14   [name], MD  pyridOXINE (VITAMIN B-6) 100 MG tablet Take 100 mg by mouth every morning.    Historical Provider, MD  rosuvastatin (CRESTOR) 5 MG tablet Take 1 tablet (5 mg total) by mouth daily. 01/03/14   [name], MD  valsartan (DIOVAN) 160 MG tablet Take 1 tablet (160 mg total) by mouth daily. 12/19/13   [name], MD   BP 123/60 mmHg  Pulse 69  Temp(Src) 98.8 F (37.1 C) (Oral)  Resp 17  SpO2 94% Physical Exam  Constitutional: She is oriented to person, place, and time. She appears well-developed and well-nourished. No distress.  HENT:  Head: Normocephalic and atraumatic.  Mouth/Throat: Oropharynx is clear and moist.  Eyes: Conjunctivae are normal. Pupils are equal, round, and reactive to light. No scleral icterus.  Neck: Neck supple.  Cardiovascular: Normal rate, regular rhythm, normal heart sounds and intact distal pulses.   No murmur heard. Pulmonary/Chest: Effort normal. No stridor. No respiratory distress. She has no wheezes. She has rales (left midlung and right base).  Abdominal: Soft. Bowel sounds are normal. She exhibits no distension. There is no tenderness.   Musculoskeletal: Normal range of motion. She exhibits no edema.  Neurological: She is alert and oriented to person, place, and time.  Skin: Skin is warm and dry. No rash noted.  Psychiatric: She has a normal mood and affect. Her behavior is normal.  Nursing note and vitals reviewed.   ED Course  Procedures (including critical care time) Labs Review Labs Reviewed  CBC WITH DIFFERENTIAL - Abnormal; Notable for the following:    RBC 3.15 (*)    Hemoglobin 9.3 (*)    HCT 28.3 (*)    Monocytes Relative 17 (*)    Monocytes Absolute 1.3 (*)    All other components within normal limits  COMPREHENSIVE METABOLIC PANEL -  Abnormal; Notable for the following:    Sodium 127 (*)    Chloride 93 (*)    Glucose, Bld 108 (*)    Creatinine, Ser 1.60 (*)    GFR calc non Af Amer 30 (*)    GFR calc Af Amer 34 (*)    All other components within normal limits  URINALYSIS, ROUTINE W REFLEX MICROSCOPIC - Abnormal; Notable for the following:    Hgb urine dipstick MODERATE (*)    Protein, ur 30 (*)    Leukocytes, UA LARGE (*)    All other components within normal limits  BRAIN NATRIURETIC PEPTIDE - Abnormal; Notable for the following:    B Natriuretic Peptide 661.3 (*)    All other components within normal limits  URINE MICROSCOPIC-ADD ON - Abnormal; Notable for the following:    Squamous Epithelial / LPF FEW (*)    All other components within normal limits  GLUCOSE, CAPILLARY - Abnormal; Notable for the following:    Glucose-Capillary 103 (*)    All other components within normal limits  CULTURE, BLOOD (ROUTINE X 2)  CULTURE, BLOOD (ROUTINE X 2)  URINE CULTURE  TROPONIN I  INFLUENZA PANEL BY PCR (TYPE A & B, H1N1)  TROPONIN I  TROPONIN I  HEMOGLOBIN B3Z  BASIC METABOLIC PANEL  CBC  I-STAT CG4 LACTIC ACID, ED    Imaging Review Dg Chest 2 View (if Patient Has Fever And/or Copd)  04/04/2014   CLINICAL DATA:  Productive cough. Shortness of breath. Fever. Duration of symptoms several days.   EXAM: CHEST  2 VIEW  COMPARISON:  09/07/2013.  FINDINGS: Artifact overlies the chest. The cardiac silhouette is enlarged. There is central bronchial thickening. No consolidation or collapse. No effusion. Chronic degenerative changes affect the spine.  IMPRESSION: Cardiomegaly, chronic.  Bronchitis.  No consolidation or collapse.   Electronically Signed   By: Nelson Chimes M.D.   On: 04/04/2014 10:13  All radiology studies independently viewed by me.      EKG Interpretation None      MDM   Final diagnoses:  Acute on chronic respiratory failure with hypoxia  Bronchitis    80 yo female presenting with several days of cough, fever, shortness of breath, and malaise.  Treated empirically for pneumonia at time of arrival.  Labs and CXR were reassuring, but pt became hypoxic with ambulation.   Given her medical comorbidities, she requires admission for further treatment.    Houston Siren III, MD 04/04/14 [phone]

## 2014-04-04 NOTE — ED Notes (Signed)
Bed: XJ15 Expected date:  Expected time:  Means of arrival:  Comments: EMS- 80yo F, fever, SOB

## 2014-04-04 NOTE — H&P (Signed)
History and Physical  [name] [name]:[id number] DOB: 1932/12/07 DOA: 04/04/2014   PCP: Kristian Covey, MD   Chief Complaint: cough, dyspnea  HPI:  80 year old female with a history of Wegener's granulomatosis, chronic atrial fibrillation, BOOP,, metastatic intestinal carcinoid, diabetes mellitus, hypertension, and breast cancer in remission presents with 3 days of worsening shortness of breath. The patient complains of URI symptoms even before these past 3 days including increasing cough, watery eyes, runny nose. 2 days prior to admission, the patient developed fever up to 102.3F at home. She also has a one-day history of nausea and vomiting without any diarrhea or abdominal pain. She has not been able to take many of her medications because of the vomiting. However, she is feeling better today without any vomiting and actually feels hungry.  The patient was admitted in January and in May 2015 with similar presentations. In the emergency department, the patient was given albuterol and Atrovent as well as ceftriaxone and azithromycin. Unfortunately, the patient had oxygen desaturation down to 86% when ambulated. The patient normally wears 2 L nasal cannula at nighttime. In the emergency department, the patient was afebrile and hemodynamically stable. She was given 1 L normal saline.   Assessment/Plan: Acute on chronic respiratory failure  patient's clinical presentation is not clear-cut although given the patient's history and presentation I would lean more toward CAP/Bronchitis although exacerbation of the patient's granulomatous polyangiitis/exacerbation of BOOP may need considered if no improvement -Continue intravenous ceftriaxone and azithromycin that were started in the emergency department  -Influenza PCR  -Restart the patient's home steroids granulomatous polyangiitis -Restart home dose of prednisone -Will give one-time stress dose of IV solumedrol 40mg  x 1 today Chronic  diastolic CHF/elevated proBNP  -Although the patient has JVD on exam, she appears clinically volume depleted  -Echocardiogram on January 2015 had moderate TR and PAP 60 which may be contributing to her JVD  -Will give 1 additional liter of fluid  -Hold furosemide for today  Hypertension  -Continue amlodipine and Bystolic  -hold valsartan for now and monitor renal function CKD stage III  -Baseline creatinine 1.5-1.8  Diabetes mellitus type 2  -01/16/2014 hemoglobin A1c 6.5  -NovoLog sliding scale  -Hold Amaryl  -half home dose Lantus Chronic atrial fibrillation  -Continue apixiban -Rate controlled  -continue BB Breast cancer -In remission -follows Dr. Darnelle Catalan Hypothyroidism -Continue Synthroid Atypical chest pain  -Likely from coughing  -Cycle troponins  -EKG       Past Medical History  Diagnosis Date  . HYPOTHYROIDISM 07/24/2008  . HYPERLIPIDEMIA 07/24/2008  . DEPRESSION 05/16/2009  . HYPERTENSION 07/24/2008  . Vertigo   . Diabetes mellitus type II   . Arthritis     r tkr  . CVA (cerebral vascular accident) 2008    mini stroke.no residual  . Heart murmur     stress test 2009.dr peter [place]  . Intermittent vertigo   . Skin abnormality     facial lesions .pt applying mupiracin to areas  . Atrial fibrillation   . Breast cancer   . Pneumonia   . 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 Knee surgery  2009    TKR  . Cholecystectomy  1980  . Joint replacement      r knee  . Breast surgery  2007    Lumpectomy, XRT 2006.l breast  . Video assisted thoracoscopy  04/22/2011    Procedure: VIDEO ASSISTED THORACOSCOPY;  Surgeon: Salvatore Decent  Dorris Fetch, MD;  Location: [facility];  Service: Thoracic;  Laterality: Left;  WITH BIOPSY  . Heel spur surgery Right   . Exploratory laparotomy     Social History:  reports that she quit smoking about 31 years ago. Her smoking use included Cigarettes. She has a 6 pack-year smoking  history. She has never used smokeless tobacco. She reports that she does not drink alcohol or use illicit drugs.   Family History  Problem Relation Age of Onset  . Arthritis Mother   . Rheum arthritis Mother   . Heart disease Father   . Coronary artery disease Father   . Cancer Sister     breast CA, both sisters  . Breast cancer Sister   . Coronary artery disease Brother   . Lung cancer Brother      Allergies  Allergen Reactions  . Codeine Sulfate Other (See Comments)    REACTION: GI upset  . Sulfonamide Derivatives Other (See Comments)    REACTION: GI upset  . Atarax [Hydroxyzine] Nausea Only and Rash      Prior to Admission medications   Medication Sig Start Date End Date Taking? Authorizing Provider  amLODipine (NORVASC) 5 MG tablet Take 1 tablet (5 mg total) by mouth daily. 12/19/13  Yes Kristian Covey, MD  apixaban (ELIQUIS) 5 MG TABS tablet Take 1 tablet (5 mg total) by mouth 2 (two) times daily. 01/03/14  Yes Kristian Covey, MD  CALCIUM CITRATE PO Take 1,200 mg by mouth daily.     Yes Historical Provider, MD  cholecalciferol (VITAMIN D) 1000 UNITS tablet Take 3,000 Units by mouth daily.     Yes Historical Provider, MD  diazepam (VALIUM) 5 MG tablet Take 1 tablet (5 mg total) by mouth every 12 (twelve) hours as needed for anxiety. 08/25/13  Yes Kristian Covey, MD  furosemide (LASIX) 20 MG tablet Take 1 tablet (20 mg total) by mouth daily. 04/28/13  Yes Kristian Covey, MD  gabapentin (NEURONTIN) 100 MG capsule Take 1 capsule (100 mg total) by mouth 2 (two) times daily before a meal. 11/08/12  Yes Simonne Martinet, NP  glimepiride (AMARYL) 2 MG tablet Take 1 tablet (2 mg total) by mouth daily before breakfast. 07/26/13  Yes Kristian Covey, MD  levothyroxine (SYNTHROID, LEVOTHROID) 112 MCG tablet Take 1 tablet (112 mcg total) by mouth daily before breakfast. 01/17/14  Yes Kristian Covey, MD  loratadine (CLARITIN) 10 MG tablet Take 10 mg by mouth daily as needed for  allergies.   Yes Historical Provider, MD  nebivolol (BYSTOLIC) 10 MG tablet Take 1 tablet (10 mg total) by mouth daily after breakfast. 02/06/14  Yes Kristian Covey, MD  predniSONE (DELTASONE) 20 MG tablet Alternate between 1/2 tab and 1/4 tab every other day 03/08/14  Yes Kristian Covey, MD  pyridOXINE (VITAMIN B-6) 100 MG tablet Take 100 mg by mouth every morning.   Yes Historical Provider, MD  rosuvastatin (CRESTOR) 5 MG tablet Take 1 tablet (5 mg total) by mouth daily. 01/03/14  Yes Kristian Covey, MD  valsartan (DIOVAN) 160 MG tablet Take 1 tablet (160 mg total) by mouth daily. 12/19/13  Yes Kristian Covey, MD  Insulin Glargine (LANTUS SOLOSTAR) 100 UNIT/ML Solostar Pen Inject 10 Units into the skin daily. Patient not taking: Reported on 04/04/2014 01/03/14   Kristian Covey, MD    Review of Systems:  Constitutional:  No weight loss, night sweats Head&Eyes: No headache.  No vision loss.  No eye pain or scotoma ENT:  No Difficulty swallowing,Tooth/dental problems,Sore throat,   Cardio-vascular:  No chest pain, Orthopnea, PND, swelling in lower extremities,  dizziness,  GI:  No  abdominal pain, nausea, vomiting, diarrhea, loss of appetite, hematochezia, melena, heartburn Resp:   of blood .No wheezing.No chest wall deformity  Skin:  no rash or lesions.  GU:  no dysuria, change in color of urine, no urgency or frequency. No flank pain.  Musculoskeletal:  No joint pain or swelling. No decreased range of motion. No back pain.  Psych:  No change in mood or affect.  Neurologic: No headache, no dysesthesia, no focal weakness, no vision loss. No syncope  Physical Exam: Filed Vitals:   04/04/14 0953 04/04/14 1206 04/04/14 1256  BP: 123/60 141/55   Pulse: 69 65   Temp: 98.8 F (37.1 C) 98.4 F (36.9 C)   TempSrc: Oral Oral   Resp: 17 21   Weight:   83.263 kg (183 lb 9 oz)  SpO2: 94% 94%    General:  A&O x 3, NAD, nontoxic, pleasant/cooperative Head/Eye: No  conjunctival hemorrhage, no icterus, [place]/AT, No nystagmus ENT:  No icterus,  No thrush, good dentition, no pharyngeal exudate Neck:  No masses, no lymphadenpathy, no bruits CV:  IRRR, no rub, no gallop, no S3 Lung:  Bilateral scattered rhonchi. No wheezing. Good air movement  Abdomen: soft/NT, +BS, nondistended, no peritoneal signs Ext: No cyanosis, No rashes, No petechiae, No lymphangitis, No edema Neuro: CNII-XII intact, strength 4/5 in bilateral upper and lower extremities, no dysmetria  Labs on Admission:  Basic Metabolic Panel:  Recent Labs Lab 04/04/14 1039  NA 127*  K 4.0  CL 93*  CO2 22  GLUCOSE 108*  BUN 22  CREATININE 1.60*  CALCIUM 8.4   Liver Function Tests:  Recent Labs Lab 04/04/14 1039  AST 26  ALT 11  ALKPHOS 66  BILITOT 0.8  PROT 6.6  ALBUMIN 3.6   No results for input(s): LIPASE, AMYLASE in the last 168 hours. No results for input(s): AMMONIA in the last 168 hours. CBC:  Recent Labs Lab 04/04/14 1039  WBC 7.7  NEUTROABS 4.3  HGB 9.3*  HCT 28.3*  MCV 89.8  PLT 225   Cardiac Enzymes: No results for input(s): CKTOTAL, CKMB, CKMBINDEX, TROPONINI in the last 168 hours. BNP: Invalid input(s): POCBNP CBG: No results for input(s): GLUCAP in the last 168 hours.  Radiological Exams on Admission: Dg Chest 2 View (if Patient Has Fever And/or Copd)  04/04/2014   CLINICAL DATA:  Productive cough. Shortness of breath. Fever. Duration of symptoms several days.  EXAM: CHEST  2 VIEW  COMPARISON:  09/07/2013.  FINDINGS: Artifact overlies the chest. The cardiac silhouette is enlarged. There is central bronchial thickening. No consolidation or collapse. No effusion. Chronic degenerative changes affect the spine.  IMPRESSION: Cardiomegaly, chronic.  Bronchitis.  No consolidation or collapse.   Electronically Signed   By: Paulina Fusi M.D.   On: 04/04/2014 10:13    EKG: Independently reviewed. pending    Time spent:70 minutes Code Status:   FULL Family  Communication:   Son updated at bedside   Colisha Redler, DO  [facility] Pager [phone]  If 7PM-7AM, please contact night-coverage [email or web address] Password [email or web address] 04/04/2014, 2:13 PM

## 2014-04-05 LAB — INFLUENZA PANEL BY PCR (TYPE A & B)
H1N1FLUPCR: NOT DETECTED
Influenza A By PCR: NEGATIVE
Influenza B By PCR: NEGATIVE

## 2014-04-05 LAB — GLUCOSE, CAPILLARY
Glucose-Capillary: 220 mg/dL — ABNORMAL HIGH (ref 70–99)
Glucose-Capillary: 212 mg/dL — ABNORMAL HIGH (ref 70–99)
Glucose-Capillary: 200 mg/dL — ABNORMAL HIGH (ref 70–99)
Glucose-Capillary: 203 mg/dL — ABNORMAL HIGH (ref 70–99)

## 2014-04-05 LAB — BASIC METABOLIC PANEL
Anion gap: 8 (ref 5–15)
BUN: 20 mg/dL (ref 6–23)
CO2: 23 mmol/L (ref 19–32)
CREATININE: 1.45 mg/dL — AB (ref 0.50–1.10)
Calcium: 8.6 mg/dL (ref 8.4–10.5)
Chloride: 95 mEq/L — ABNORMAL LOW (ref 96–112)
GFR calc Af Amer: 39 mL/min — ABNORMAL LOW (ref >90)
GFR calc non Af Amer: 33 mL/min — ABNORMAL LOW (ref >90)
Glucose, Bld: 282 mg/dL — ABNORMAL HIGH (ref 70–99)
Potassium: 5 mmol/L (ref 3.5–5.1)
Sodium: 126 mmol/L — ABNORMAL LOW (ref 135–145)

## 2014-04-05 LAB — CBC
HCT: 27.1 % — ABNORMAL LOW (ref 36.0–46.0)
Hemoglobin: 8.9 g/dL — ABNORMAL LOW (ref 12.0–15.0)
MCH: 29.3 pg (ref 26.0–34.0)
MCHC: 32.8 g/dL (ref 30.0–36.0)
MCV: 89.1 fL (ref 78.0–100.0)
Platelets: 225 10*3/uL (ref 150–400)
RBC: 3.04 MIL/uL — ABNORMAL LOW (ref 3.87–5.11)
RDW: 14.6 % (ref 11.5–15.5)
WBC: 7 10*3/uL (ref 4.0–10.5)

## 2014-04-05 LAB — TROPONIN I
TROPONIN I: 0.03 ng/mL (ref <0.031)
Troponin I: <0.03 ng/mL (ref <0.031)

## 2014-04-05 LAB — URINE CULTURE: Colony Count: 4000

## 2014-04-05 LAB — HEMOGLOBIN A1C
HEMOGLOBIN A1C: 6.8 % — AB (ref <5.7)
Mean Plasma Glucose: 148 mg/dL — ABNORMAL HIGH (ref <117)

## 2014-04-05 MED ORDER — GLUCERNA SHAKE PO LIQD
237.0000 mL | ORAL | Status: DC
  Administered 2014-04-06: 237 mL via ORAL
  Filled 2014-04-05: qty 237

## 2014-04-05 MED ORDER — VANCOMYCIN HCL 10 G IV SOLR
1250.0000 mg | INTRAVENOUS | Status: DC
  Administered 2014-04-06: 1250 mg via INTRAVENOUS
  Filled 2014-04-05: qty 1250

## 2014-04-05 MED ORDER — GLUCERNA SHAKE PO LIQD
237.0000 mL | Freq: Two times a day (BID) | ORAL | Status: DC
  Administered 2014-04-05: 237 mL via ORAL
  Filled 2014-04-05 (×2): qty 237

## 2014-04-05 MED ORDER — IPRATROPIUM-ALBUTEROL 0.5-2.5 (3) MG/3ML IN SOLN
3.0000 mL | Freq: Three times a day (TID) | RESPIRATORY_TRACT | Status: DC
  Filled 2014-04-05 (×4): qty 3

## 2014-04-05 MED ORDER — VANCOMYCIN HCL 10 G IV SOLR
1250.0000 mg | Freq: Once | INTRAVENOUS | Status: AC
  Administered 2014-04-05: 1250 mg via INTRAVENOUS
  Filled 2014-04-05: qty 1250

## 2014-04-05 NOTE — Progress Notes (Signed)
Utilization Review completed.  Stephaine Breshears RN CM  

## 2014-04-05 NOTE — Progress Notes (Signed)
ANTIBIOTIC CONSULT NOTE - INITIAL  Pharmacy Consult for vancomcyin Indication: GPC in clusters  Allergies  Allergen Reactions  . Codeine Sulfate Other (See Comments)    REACTION: GI upset  . Sulfonamide Derivatives Other (See Comments)    REACTION: GI upset  . Atarax [Hydroxyzine] Nausea Only and Rash    Patient Measurements: Height: _0  (167.6 cm) Weight: 183 lb 9 oz (83.263 kg) IBW/kg (Calculated) : 59.3 Adjusted Body Weight:   Vital Signs: Temp: 97.7 F (36.5 C) (12/30 0411) Temp Source: Oral (12/30 0411) BP: 151/72 mmHg (12/30 0411) Pulse Rate: 76 (12/30 0411) Intake/Output from previous day: 12/29 0701 - 12/30 0700 In: 1172.5 [P.O.:240; I.V.:932.5] Out: -  Intake/Output from this shift: Total I/O In: 240 [P.O.:240] Out: -   Labs:  Recent Labs  04/04/14 1039 04/05/14 0420  WBC 7.7 7.0  HGB 9.3* 8.9*  PLT 225 225  CREATININE 1.60* 1.45*   Estimated Creatinine Clearance: 34.2 mL/min (by C-G formula based on Cr of 1.45). No results for input(s): VANCOTROUGH, VANCOPEAK, VANCORANDOM, GENTTROUGH, GENTPEAK, GENTRANDOM, TOBRATROUGH, TOBRAPEAK, TOBRARND, AMIKACINPEAK, AMIKACINTROU, AMIKACIN in the last 72 hours.   Microbiology: Recent Results (from the past 720 hour(s))  Blood Culture (routine x 2)     Status: None (Preliminary result)   Collection Time: 04/04/14 10:38 AM  Result Value Ref Range Status   Specimen Description BLOOD RIGHT HAND  Final   Special Requests BOTTLES DRAWN AEROBIC AND ANAEROBIC 5CC  Final   Culture   Final    GRAM POSITIVE COCCI IN CLUSTERS Note: Gram Stain Report Called to,Read Back By and Verified With: KATIE B. BY INGRAM A 04/05/14 1045AM Performed at [facility]    Report Status PENDING  Incomplete  Blood Culture (routine x 2)     Status: None (Preliminary result)   Collection Time: 04/04/14 11:06 AM  Result Value Ref Range Status   Specimen Description BLOOD RIGHT ANTECUBITAL  Final   Special Requests BOTTLES DRAWN  AEROBIC AND ANAEROBIC 5CC  Final   Culture   Final           BLOOD CULTURE RECEIVED NO GROWTH TO DATE CULTURE WILL BE HELD FOR 5 DAYS BEFORE ISSUING A FINAL NEGATIVE REPORT Performed at [facility]    Report Status PENDING  Incomplete   Assessment: 55 YOF presents with cough and shortness of breath.  Started on ceftriaxone and azithromycin for CAP/bronchitis. Blood cultures reveal GPC clusters currently 1/2 bottles. History of CKD documented  12/29 >> ceftriaxone  >> 12/29 >> azithromycin >>   12/30 >> vancomycin >>  Tmax: afebrile WBCs: WNL Renal: SCr = 1.45, normalized CrCl 48m/min  12/30 blood: GPC clusters 1/2 12/29 urine: sent  Goal of Therapy:  Vancomycin trough level 15-20 mcg/ml  Plan:   Vancomycin 12554mIV q24h  Await final blood cultures, if vancomycin to continue then check trough  Follow renal function  ZeClovis Riley2/30/2015,11:12 AM

## 2014-04-05 NOTE — Progress Notes (Addendum)
Around 30 minutes after starting vanc infusion pt called reporting itching and redness in her arm.  Vanc infusion was stopped and ice given per pharmacy recommendation.  Dr. Carles Collet made aware.  Per pharmacist and MD Vanc infusion was restarted at half the rate once redness and itching was gone.  Will continue to monitor pt closely for another reaction.

## 2014-04-05 NOTE — Progress Notes (Signed)
Lab called with results of a pos. Blood cx- gram pos. Cocci and cluster.  MD made aware.

## 2014-04-05 NOTE — Progress Notes (Signed)
INITIAL NUTRITION ASSESSMENT  DOCUMENTATION CODES Per approved criteria  -Not Applicable   INTERVENTION: -Recommend Glucerna shake once daily, each supplement provides 220 kcal, 9 gram protein -Encouraged consistent intake of supplement regimen at home for muscle maintenance -RD to continue to monitor  NUTRITION DIAGNOSIS: Inadequate oral intake related to decreased appetite as evidenced by diet recall, 20 lb weight loss in 6 months   Goal: Pt to meet >/= 90% of their estimated nutrition needs    Monitor:  Total protein/energy intake, labs, weights, glucose profile  Reason for Assessment: MST  80 y.o. female  Admitting Dx: <principal problem not specified>  ASSESSMENT: 80 year old female with a history of Wegener's granulomatosis, chronic atrial fibrillation, BOOP,, metastatic intestinal carcinoid, diabetes mellitus, hypertension, and breast cancer in remission presents with 3 days of worsening shortness of breath.  -Pt endorsed an unintentional wt loss of 20 lbs in past 6-7 months (10% body weight loss, significant for time frame) -Diet recall indicates pt consuming two meals daily, breakfast consisting of crackers and fruit, and a larger dinner meal of meat/starch/vegetable. Will drink Glucerna supplements; but only on rare occasion -Reported some nausea/vomiting pta; however this has since improved and is able to consume 75% of meal -Glucerna BID ordered, will modify to once daily as pt with improved appetite -CBG elevated; likely r/t to DM2 and prednisone -Low Na, consider fluid restriction as warranted  Height: Ht Readings from Last 1 Encounters:  04/04/14 5' 6" (1.676 m)    Weight: Wt Readings from Last 1 Encounters:  04/04/14 183 lb 9 oz (83.263 kg)    Ideal Body Weight: 130 lb  % Ideal Body Weight: 141%  Wt Readings from Last 10 Encounters:  04/04/14 183 lb 9 oz (83.263 kg)  02/09/14 189 lb 6.4 oz (85.911 kg)  01/16/14 196 lb 12.8 oz (89.268 kg)   01/09/14 199 lb 6.4 oz (90.447 kg)  11/02/13 187 lb (84.823 kg)  09/07/13 197 lb (89.359 kg)  08/25/13 202 lb (91.627 kg)  08/25/13 202 lb (91.627 kg)  08/21/13 201 lb 8 oz (91.4 kg)  08/09/13 203 lb (92.08 kg)    Usual Body Weight: 200  % Usual Body Weight: 90%  BMI:  Body mass index is 29.64 kg/(m^2).  Estimated Nutritional Needs: Kcal: 1650-1850 Protein: 70-85 gram Fluid: >/=1500 ml daily  Skin: WDL  Diet Order: Diet Carb Modified  EDUCATION NEEDS: -No education needs identified at this time   Intake/Output Summary (Last 24 hours) at 04/05/14 1629 Last data filed at 04/05/14 0853  Gross per 24 hour  Intake 1412.5 ml  Output      0 ml  Net 1412.5 ml    Last BM: 12/27   Labs:   Recent Labs Lab 04/04/14 1039 04/05/14 0420  NA 127* 126*  K 4.0 5.0  CL 93* 95*  CO2 22 23  BUN 22 20  CREATININE 1.60* 1.45*  CALCIUM 8.4 8.6  GLUCOSE 108* 282*    CBG (last 3)   Recent Labs  04/04/14 2132 04/05/14 0717 04/05/14 1158  GLUCAP 231* 220* 212*    Scheduled Meds: . amLODipine  5 mg Oral Daily  . apixaban  5 mg Oral BID  . azithromycin  500 mg Intravenous Q24H  . calcium citrate  2 tablet Oral Daily  . cefTRIAXone (ROCEPHIN)  IV  1 g Intravenous Q24H  . cholecalciferol  3,000 Units Oral Daily  . feeding supplement (GLUCERNA SHAKE)  237 mL Oral BID BM  . gabapentin  100 mg  Oral BID AC  . insulin aspart  0-9 Units Subcutaneous TID WC  . insulin glargine  5 Units Subcutaneous QHS  . ipratropium-albuterol  3 mL Nebulization TID  . levothyroxine  112 mcg Oral QAC breakfast  . nebivolol  10 mg Oral QPC breakfast  . predniSONE  10 mg Oral Q48H  . [START ON 04/06/2014] predniSONE  5 mg Oral Q48H  . pyridOXINE  100 mg Oral q morning - 10a  . rosuvastatin  5 mg Oral Daily  . sodium chloride  3 mL Intravenous Q12H  . [START ON 04/06/2014] vancomycin  1,250 mg Intravenous Q24H    Continuous Infusions: . sodium chloride 75 mL/hr at 04/05/14 0530     Past Medical History  Diagnosis Date  . HYPOTHYROIDISM 07/24/2008  . HYPERLIPIDEMIA 07/24/2008  . DEPRESSION 05/16/2009  . HYPERTENSION 07/24/2008  . Vertigo   . Diabetes mellitus type II   . Arthritis     r tkr  . CVA (cerebral vascular accident) 2008    mini stroke.no residual  . Heart murmur     stress test 2009.dr peter [place]  . Intermittent vertigo   . Skin abnormality     facial lesions .pt applying mupiracin to areas  . Atrial fibrillation   . Breast cancer   . Pneumonia   . Wegener's disease, pulmonary 10/2012    Past Surgical History  Procedure Laterality Date  . Knee surgery  2009    TKR  . Cholecystectomy  1980  . Joint replacement      r knee  . Breast surgery  2007    Lumpectomy, XRT 2006.l breast  . Video assisted thoracoscopy  04/22/2011    Procedure: VIDEO ASSISTED THORACOSCOPY;  Surgeon: Melrose Nakayama, MD;  Location: [facility];  Service: Thoracic;  Laterality: Left;  WITH BIOPSY  . Heel spur surgery Right   . Exploratory laparotomy      Atlee Abide MS RD LDN Clinical Dietitian [phone]

## 2014-04-05 NOTE — Progress Notes (Signed)
PROGRESS NOTE  [name] [name] FTD:[id number] DOB: [birth year] DOA: 04/04/2014 PCP: Kristian Covey, MD  Assessment/Plan: Acute on chronic respiratory failure  -patient's clinical presentation was not clear-cut although given the patient's history and presentation I would lean more toward CAP/Bronchitis although exacerbation of the patient's granulomatous polyangiitis/exacerbation of BOOP may need considered if no improvement -04/05/14--patient is showing improvement clinically -Continue intravenous ceftriaxone and azithromycin  -Influenza PCR--neg -Restart the patient's home steroids dose granulomatous polyangiitis -Restart home dose of prednisone -Will give one-time stress dose of IV solumedrol 40mg  x 1 today Chronic diastolic CHF/elevated proBNP  -Although the patient has JVD on exam, she appears clinically volume depleted  -Echocardiogram on January 2015 had moderate TR and PAP 60 which may be contributing to her JVD  -Patient has received 2 L normal saline total -Hold furosemide for today  Bacteremia -GPC clusters one of 2 sets -startempiric vanco IV pending culture data Pyuria -continue ceftriaxone pending cultures Hypertension  -Continue amlodipine and Bystolic  -hold valsartan for now and monitor renal function Hyponatremia -This is chronic -Baseline sodium 125-131 CKD stage III  -Baseline creatinine 1.5-1.8  Diabetes mellitus type 2  -04/04/2014  hemoglobin A1c 6.8  -NovoLog sliding scale  -Hold Amaryl  -half home dose Lantus Chronic atrial fibrillation  -Continue apixiban -Rate controlled  -continue BB Breast cancer -In remission -follows Dr. Darnelle Catalan Hypothyroidism -Continue Synthroid Atypical chest pain  -Likely from coughing  -Cycle troponins--neg x3  Family Communication:   Pt at beside Disposition Plan:   Home when medically stable       Procedures/Studies: Dg Chest 2 View (if Patient Has Fever And/or Copd)  04/04/2014    CLINICAL DATA:  Productive cough. Shortness of breath. Fever. Duration of symptoms several days.  EXAM: CHEST  2 VIEW  COMPARISON:  09/07/2013.  FINDINGS: Artifact overlies the chest. The cardiac silhouette is enlarged. There is central bronchial thickening. No consolidation or collapse. No effusion. Chronic degenerative changes affect the spine.  IMPRESSION: Cardiomegaly, chronic.  Bronchitis.  No consolidation or collapse.   Electronically Signed   By: Paulina Fusi M.D.   On: 04/04/2014 10:13         Subjective:  patient is breathing better today but still has some dyspnea on exertion. Denies any fevers, chills, chest pain, nausea, vomiting, diarrhea, abdominal pain.   Objective: Filed Vitals:   04/04/14 2130 04/05/14 0022 04/05/14 0411 04/05/14 1339  BP: 134/68  151/72 141/93  Pulse: 94  76 64  Temp: 98.1 F (36.7 C)  97.7 F (36.5 C) 98.2 F (36.8 C)  TempSrc: Oral  Oral Oral  Resp: 18  18 18   Height:      Weight:      SpO2: 93% 98% 99% 100%    Intake/Output Summary (Last 24 hours) at 04/05/14 1604 Last data filed at 04/05/14 0853  Gross per 24 hour  Intake 1412.5 ml  Output      0 ml  Net 1412.5 ml   Weight change:  Exam:   General:  Pt is alert, follows commands appropriately, not in acute distress  HEENT: No icterus, No thrush,  [place]/AT  Cardiovascular: IRRR, S1/S2, no rubs, no gallops  Respiratory: bilateral rales, left greater than right. No wheezing. Good air movement.   Abdomen: Soft/+BS, non tender, non distended, no guarding  Extremities: No edema, No lymphangitis, No petechiae, No rashes, no synovitis  Data Reviewed: Basic Metabolic Panel:  Recent Labs Lab 04/04/14 1039 04/05/14  0420  NA 127* 126*  K 4.0 5.0  CL 93* 95*  CO2 22 23  GLUCOSE 108* 282*  BUN 22 20  CREATININE 1.60* 1.45*  CALCIUM 8.4 8.6   Liver Function Tests:  Recent Labs Lab 04/04/14 1039  AST 26  ALT 11  ALKPHOS 66  BILITOT 0.8  PROT 6.6  ALBUMIN 3.6   No  results for input(s): LIPASE, AMYLASE in the last 168 hours. No results for input(s): AMMONIA in the last 168 hours. CBC:  Recent Labs Lab 04/04/14 1039 04/05/14 0420  WBC 7.7 7.0  NEUTROABS 4.3  --   HGB 9.3* 8.9*  HCT 28.3* 27.1*  MCV 89.8 89.1  PLT 225 225   Cardiac Enzymes:  Recent Labs Lab 04/04/14 1720 04/04/14 2320 04/05/14 0420  TROPONINI 0.03 0.03 <0.03   BNP: Invalid input(s): POCBNP CBG:  Recent Labs Lab 04/04/14 1708 04/04/14 2132 04/05/14 0717 04/05/14 1158  GLUCAP 103* 231* 220* 212*    Recent Results (from the past 240 hour(s))  Blood Culture (routine x 2)     Status: None (Preliminary result)   Collection Time: 04/04/14 10:38 AM  Result Value Ref Range Status   Specimen Description BLOOD RIGHT HAND  Final   Special Requests BOTTLES DRAWN AEROBIC AND ANAEROBIC 5CC  Final   Culture   Final    GRAM POSITIVE COCCI IN CLUSTERS Note: Gram Stain Report Called to,Read Back By and Verified With: KATIE B. BY INGRAM A 04/05/14 1045AM Performed at [facility]    Report Status PENDING  Incomplete  Urine culture     Status: None   Collection Time: 04/04/14 10:57 AM  Result Value Ref Range Status   Specimen Description URINE, CLEAN CATCH  Final   Special Requests NONE  Final   Colony Count   Final    4,000 COLONIES/ML Performed at [facility]    Culture   Final    INSIGNIFICANT GROWTH Performed at [facility]    Report Status 04/05/2014 FINAL  Final  Blood Culture (routine x 2)     Status: None (Preliminary result)   Collection Time: 04/04/14 11:06 AM  Result Value Ref Range Status   Specimen Description BLOOD RIGHT ANTECUBITAL  Final   Special Requests BOTTLES DRAWN AEROBIC AND ANAEROBIC 5CC  Final   Culture   Final           BLOOD CULTURE RECEIVED NO GROWTH TO DATE CULTURE WILL BE HELD FOR 5 DAYS BEFORE ISSUING A FINAL NEGATIVE REPORT Performed at [facility]    Report Status PENDING  Incomplete      Scheduled Meds: . amLODipine  5 mg Oral Daily  . apixaban  5 mg Oral BID  . azithromycin  500 mg Intravenous Q24H  . calcium citrate  2 tablet Oral Daily  . cefTRIAXone (ROCEPHIN)  IV  1 g Intravenous Q24H  . cholecalciferol  3,000 Units Oral Daily  . feeding supplement (GLUCERNA SHAKE)  237 mL Oral BID BM  . gabapentin  100 mg Oral BID AC  . insulin aspart  0-9 Units Subcutaneous TID WC  . insulin glargine  5 Units Subcutaneous QHS  . ipratropium-albuterol  3 mL Nebulization TID  . levothyroxine  112 mcg Oral QAC breakfast  . nebivolol  10 mg Oral QPC breakfast  . predniSONE  10 mg Oral Q48H  . [START ON 04/06/2014] predniSONE  5 mg Oral Q48H  . pyridOXINE  100 mg Oral q morning - 10a  .  rosuvastatin  5 mg Oral Daily  . sodium chloride  3 mL Intravenous Q12H  . [START ON 04/06/2014] vancomycin  1,250 mg Intravenous Q24H   Continuous Infusions: . sodium chloride 75 mL/hr at 04/05/14 0530     Raela Bohl, DO  [facility] Pager [phone]  If 7PM-7AM, please contact night-coverage [email or web address] Password [email or web address] 04/05/2014, 4:04 PM   LOS: 1 day

## 2014-04-05 NOTE — Clinical Documentation Improvement (Signed)
Abnormal Lab and/or Testing Results: Sodium: 12/30: 126 12/29: 127   Treatment provided: 04/05/14: 0.9% NaCl @ 75cc/hr   Possible Clinical Conditions: > Hyponatremia > Other > Unable to determine    Thank Sherian Maroon Documentation Specialist [phone] [email or web address] [email or web address]

## 2014-04-05 NOTE — Progress Notes (Signed)
Vanc infusion complete.  No redness or itching noted.  Will continue to monitor closely.

## 2014-04-06 LAB — CULTURE, BLOOD (ROUTINE X 2)

## 2014-04-06 LAB — BASIC METABOLIC PANEL
ANION GAP: 8 (ref 5–15)
BUN: 22 mg/dL (ref 6–23)
CO2: 25 mmol/L (ref 19–32)
CREATININE: 1.38 mg/dL — AB (ref 0.50–1.10)
Calcium: 8.7 mg/dL (ref 8.4–10.5)
Chloride: 95 mEq/L — ABNORMAL LOW (ref 96–112)
GFR calc Af Amer: 41 mL/min — ABNORMAL LOW (ref >90)
GFR, EST NON AFRICAN AMERICAN: 35 mL/min — AB (ref >90)
Glucose, Bld: 154 mg/dL — ABNORMAL HIGH (ref 70–99)
POTASSIUM: 4.4 mmol/L (ref 3.5–5.1)
Sodium: 128 mmol/L — ABNORMAL LOW (ref 135–145)

## 2014-04-06 LAB — GLUCOSE, CAPILLARY
Glucose-Capillary: 135 mg/dL — ABNORMAL HIGH (ref 70–99)
Glucose-Capillary: 157 mg/dL — ABNORMAL HIGH (ref 70–99)

## 2014-04-06 MED ORDER — LEVOFLOXACIN 750 MG PO TABS: 750.0000 mg | ORAL_TABLET | ORAL | Status: DC

## 2014-04-06 MED ORDER — PREDNISONE 20 MG PO TABS
40.0000 mg | ORAL_TABLET | Freq: Every day | ORAL | Status: DC
  Filled 2014-04-06: qty 2

## 2014-04-06 MED ORDER — PREDNISONE 20 MG PO TABS: 40.0000 mg | ORAL_TABLET | Freq: Every day | ORAL | Status: DC

## 2014-04-06 NOTE — Procedures (Signed)
Pt is refusing her neb meds.  She says that she feels like the meds covers her throat and make it difficult for her to breath.  Pt has diminshed rhonchi and no wheezes.

## 2014-04-06 NOTE — Progress Notes (Signed)
Pt ambulated in hall on room air. O2 sat 90% while ambulating. Stacey Drain

## 2014-04-06 NOTE — Discharge Summary (Signed)
Physician Discharge Summary  [name] [name] ZOX:[id number] DOB: 1933-01-13 DOA: 04/04/2014  PCP: Kristian Covey, MD  Admit date: 04/04/2014 Discharge date: 04/06/2014  Recommendations for Outpatient Follow-up:  1. Pt will need to follow up with PCP in 2 weeks post discharge 2. Please obtain BMP in one week   Discharge Diagnoses:  Acute on chronic respiratory failure  -patient's clinical presentation was not clear-cut although given the patient's history and presentation I would lean more toward CAP/Bronchitis although exacerbation of the patient's granulomatous polyangiitis/exacerbation of BOOP may need considered if no improvement -04/05/14--patient is showing improvement clinically -Continue intravenous ceftriaxone and azithromycin  -Influenza PCR--neg -The patient experienced significant clinical improvement. She stated her breathing was 80-90% improved on the day of discharge -The patient had a minimal amount of wheezing on the day of discharge--she will be given prednisone 40 mg daily 6 days with which she will be discharged -Patient will be discharged with 5 additional days of levofloxacin 750 mg every 48 hours to complete one week of therapy with antibiotics -The patient was ambulated prior to discharge without oxygen. She did not have any clinically significant oxygen desaturation. She will be discharged with her usual nighttime dose of oxygen at 2 L nasal cannula granulomatous polyangiitis -Prednisone 40 mg daily 6 days after which she will start back on her usual dose of prednisone prior to admission -Will give one-time stress dose of IV solumedrol 40mg  x 1 on day of admission -f/u pulmonary after d/c Chronic diastolic CHF/elevated proBNP  -Although the patient has JVD on exam, she appears clinically volume depleted  -Echocardiogram on January 2015 had moderate TR and PAP 60 which may be contributing to her JVD  -Patient has received 2 L normal saline total -Hold  furosemide for today  -Patient will restart her usual dose of furosemide after discharge Bacteremia -GPC clusters one of 2 sets--CoNS -start empiric vanco IV--ultimately discontinued -The patient experienced red man syndrome with vancomycin which resolved when the rate of infusion was decreased Pyuria -continue ceftriaxone pending cultures--> cultures only grew 4000 colonies which was clinically insignificant Hypertension  -Continue amlodipine and Bystolic  -hold valsartan for now and monitor renal function -Patient's renal function remained stable throughout the hospitalization -She will restart valsartan after discharge Hyponatremia -This is chronic -Baseline sodium 125-131 CKD stage III  -Baseline creatinine 1.5-1.8  Diabetes mellitus type 2  -04/04/2014 hemoglobin A1c 6.8  -NovoLog sliding scale  -Hold Amaryl  -half home dose Lantus -After discharge, the patient will resume her Amaryl and usual home dose of Lantus, 10 units at bedtime Chronic atrial fibrillation  -Continue apixiban -Rate controlled  -continue BB Breast cancer -In remission -follows Dr. Darnelle Catalan Hypothyroidism -Continue Synthroid Atypical chest pain  -Likely from coughing  -Cycle troponins--neg x3  Discharge Condition: stable  Disposition:  home Diet:carb modified Wt Readings from Last 3 Encounters:  04/06/14 83.7 kg (184 lb 8.4 oz)  02/09/14 85.911 kg (189 lb 6.4 oz)  01/16/14 89.268 kg (196 lb 12.8 oz)    History of present illness:  80 year old female with a history of Wegener's granulomatosis, chronic atrial fibrillation, BOOP,, metastatic intestinal carcinoid, diabetes mellitus, hypertension, and breast cancer in remission presents with 3 days of worsening shortness of breath. The patient complains of URI symptoms even before these past 3 days including increasing cough, watery eyes, runny nose. 2 days prior to admission, the patient developed fever up to 102.10F at home. She also has  a one-day history of nausea and vomiting without any diarrhea or  abdominal pain. She has not been able to take many of her medications because of the vomiting. However, she is feeling better today without any vomiting and actually feels hungry. The patient was admitted in January and in May 2015 with similar presentations. In the emergency department, the patient was given albuterol and Atrovent as well as ceftriaxone and azithromycin. Unfortunately, the patient had oxygen desaturation down to 86% when ambulated. The patient normally wears 2 L nasal cannula at nighttime. The patient was started on intravenous antibiotics. She was given a one-time stress dose of Solu-Medrol 40 mg IV. The patient experienced significant clinical improvement. The patient will be sent home with 5 additional days of antibiotics to complete one week of therapy. Since the patient had a minimal amount of wheezing on the day of discharge, she will be sent home with prednisone 40 mg daily 6 days.    Discharge Exam: Filed Vitals:   04/06/14 1414  BP: 128/63  Pulse: 67  Temp: 98.4 F (36.9 C)  Resp: 18   Filed Vitals:   04/05/14 2125 04/06/14 0453 04/06/14 1414 04/06/14 1521  BP: 140/80 115/80 128/63   Pulse: 67 65 67   Temp: 97.7 F (36.5 C) 98.5 F (36.9 C) 98.4 F (36.9 C)   TempSrc: Oral Oral Oral   Resp: 20 20 18    Height:      Weight:  83.7 kg (184 lb 8.4 oz)    SpO2: 99% 94% 99% 90%   General: A&O x 3, NAD, pleasant, cooperative Cardiovascular: RRR, no rub, no gallop, no S3 Respiratory: Minimal basilar wheezing. Good air movement. No rhonchi Abdomen:soft, nontender, nondistended, positive bowel sounds Extremities: No edema, No lymphangitis, no petechiae  Discharge Instructions      Discharge Instructions    Diet - low sodium heart healthy    Complete by:  As directed      Discharge instructions    Complete by:  As directed   Take prednisone 40mg  (2 tablets) once daily x 6 days.  On day #7,  restart your usual home dose of prednisone     Increase activity slowly    Complete by:  As directed             Medication List    TAKE these medications        amLODipine 5 MG tablet  Commonly known as:  NORVASC  Take 1 tablet (5 mg total) by mouth daily.     apixaban 5 MG Tabs tablet  Commonly known as:  ELIQUIS  Take 1 tablet (5 mg total) by mouth 2 (two) times daily.     CALCIUM CITRATE PO  Take 1,200 mg by mouth daily.     cholecalciferol 1000 UNITS tablet  Commonly known as:  VITAMIN D  Take 3,000 Units by mouth daily.     diazepam 5 MG tablet  Commonly known as:  VALIUM  Take 1 tablet (5 mg total) by mouth every 12 (twelve) hours as needed for anxiety.     furosemide 20 MG tablet  Commonly known as:  LASIX  Take 1 tablet (20 mg total) by mouth daily.     gabapentin 100 MG capsule  Commonly known as:  NEURONTIN  Take 1 capsule (100 mg total) by mouth 2 (two) times daily before a meal.     glimepiride 2 MG tablet  Commonly known as:  AMARYL  Take 1 tablet (2 mg total) by mouth daily before breakfast.     Insulin Glargine  100 UNIT/ML Solostar Pen  Commonly known as:  LANTUS SOLOSTAR  Inject 10 Units into the skin daily.     levofloxacin 750 MG tablet  Commonly known as:  LEVAQUIN  Take 1 tablet (750 mg total) by mouth every other day.     levothyroxine 112 MCG tablet  Commonly known as:  SYNTHROID, LEVOTHROID  Take 1 tablet (112 mcg total) by mouth daily before breakfast.     loratadine 10 MG tablet  Commonly known as:  CLARITIN  Take 10 mg by mouth daily as needed for allergies.     nebivolol 10 MG tablet  Commonly known as:  BYSTOLIC  Take 1 tablet (10 mg total) by mouth daily after breakfast.     predniSONE 20 MG tablet  Commonly known as:  DELTASONE  Alternate between 1/2 tab and 1/4 tab every other day     predniSONE 20 MG tablet  Commonly known as:  DELTASONE  Take 2 tablets (40 mg total) by mouth daily with breakfast.     pyridOXINE  100 MG tablet  Commonly known as:  VITAMIN B-6  Take 100 mg by mouth every morning.     rosuvastatin 5 MG tablet  Commonly known as:  CRESTOR  Take 1 tablet (5 mg total) by mouth daily.     valsartan 160 MG tablet  Commonly known as:  DIOVAN  Take 1 tablet (160 mg total) by mouth daily.         The results of significant diagnostics from this hospitalization (including imaging, microbiology, ancillary and laboratory) are listed below for reference.    Significant Diagnostic Studies: Dg Chest 2 View (if Patient Has Fever And/or Copd)  04/04/2014   CLINICAL DATA:  Productive cough. Shortness of breath. Fever. Duration of symptoms several days.  EXAM: CHEST  2 VIEW  COMPARISON:  09/07/2013.  FINDINGS: Artifact overlies the chest. The cardiac silhouette is enlarged. There is central bronchial thickening. No consolidation or collapse. No effusion. Chronic degenerative changes affect the spine.  IMPRESSION: Cardiomegaly, chronic.  Bronchitis.  No consolidation or collapse.   Electronically Signed   By: Paulina Fusi M.D.   On: 04/04/2014 10:13     Microbiology: Recent Results (from the past 240 hour(s))  Blood Culture (routine x 2)     Status: None   Collection Time: 04/04/14 10:38 AM  Result Value Ref Range Status   Specimen Description BLOOD RIGHT HAND  Final   Special Requests BOTTLES DRAWN AEROBIC AND ANAEROBIC 5CC  Final   Culture   Final    STAPHYLOCOCCUS SPECIES (COAGULASE NEGATIVE) Note: THE SIGNIFICANCE OF ISOLATING THIS ORGANISM FROM A SINGLE SET OF BLOOD CULTURES WHEN MULTIPLE SETS ARE DRAWN IS UNCERTAIN. PLEASE NOTIFY THE MICROBIOLOGY DEPARTMENT WITHIN ONE WEEK IF SPECIATION AND SENSITIVITIES ARE REQUIRED. Note: Gram Stain Report Called to,Read Back By and Verified With: KATIE B. BY INGRAM A 04/05/14 1045AM Performed at [facility]    Report Status 04/06/2014 FINAL  Final  Urine culture     Status: None   Collection Time: 04/04/14 10:57 AM  Result Value Ref  Range Status   Specimen Description URINE, CLEAN CATCH  Final   Special Requests NONE  Final   Colony Count   Final    4,000 COLONIES/ML Performed at [facility]    Culture   Final    INSIGNIFICANT GROWTH Performed at [facility]    Report Status 04/05/2014 FINAL  Final  Blood Culture (routine x 2)  Status: None (Preliminary result)   Collection Time: 04/04/14 11:06 AM  Result Value Ref Range Status   Specimen Description BLOOD RIGHT ANTECUBITAL  Final   Special Requests BOTTLES DRAWN AEROBIC AND ANAEROBIC 5CC  Final   Culture   Final           BLOOD CULTURE RECEIVED NO GROWTH TO DATE CULTURE WILL BE HELD FOR 5 DAYS BEFORE ISSUING A FINAL NEGATIVE REPORT Performed at [facility]    Report Status PENDING  Incomplete     Labs: Basic Metabolic Panel:  Recent Labs Lab 04/04/14 1039 04/05/14 0420 04/06/14 1230  NA 127* 126* 128*  K 4.0 5.0 4.4  CL 93* 95* 95*  CO2 22 23 25   GLUCOSE 108* 282* 154*  BUN 22 20 22   CREATININE 1.60* 1.45* 1.38*  CALCIUM 8.4 8.6 8.7   Liver Function Tests:  Recent Labs Lab 04/04/14 1039  AST 26  ALT 11  ALKPHOS 66  BILITOT 0.8  PROT 6.6  ALBUMIN 3.6   No results for input(s): LIPASE, AMYLASE in the last 168 hours. No results for input(s): AMMONIA in the last 168 hours. CBC:  Recent Labs Lab 04/04/14 1039 04/05/14 0420  WBC 7.7 7.0  NEUTROABS 4.3  --   HGB 9.3* 8.9*  HCT 28.3* 27.1*  MCV 89.8 89.1  PLT 225 225   Cardiac Enzymes:  Recent Labs Lab 04/04/14 1720 04/04/14 2320 04/05/14 0420  TROPONINI 0.03 0.03 <0.03   BNP: Invalid input(s): POCBNP CBG:  Recent Labs Lab 04/05/14 1158 04/05/14 1709 04/05/14 2123 04/06/14 0734 04/06/14 1209  GLUCAP 212* 200* 203* 135* 157*    Time coordinating discharge:  Greater than 30 minutes  Signed:  Atavia Poppe, DO [facility] Pager: [phone] 04/06/2014, 4:43 PM

## 2014-04-10 LAB — CULTURE, BLOOD (ROUTINE X 2): Culture: NO GROWTH

## 2014-04-14 ENCOUNTER — Telehealth: Payer: Self-pay | Admitting: Family Medicine

## 2014-04-14 NOTE — Telephone Encounter (Signed)
Appt scheduled for Monday 04/17/14

## 2014-04-14 NOTE — Telephone Encounter (Signed)
PLEASE NOTE: All timestamps contained within this report are represented as [place] Standard Time. CONFIDENTIALTY NOTICE: This fax transmission is intended only for the addressee. It contains information that is legally privileged, confidential or otherwise protected from use or disclosure. If you are not the intended recipient, you are strictly prohibited from reviewing, disclosing, copying using or disseminating any of this information or taking any action in reliance on or regarding this information. If you have received this fax in error, please notify us immediately by telephone so that we can arrange for its return to [place]. Phone: [phone], Toll-Free: [phone], Fax: [phone] Page: 1 of 2 Call Id: [id number] [facility] - Client [place] Patient Name: [name] [name] Gender: Female DOB: 1932-11-20 Age: 81 Y 97 M 17 D Return Phone Number: [phone] (Primary) Address: [place] City/State/Zip: [place] [address] Client [place] [facility] - Client Client Site [facility] - Day Physician Carolann Littler Contact Type Call Call Type Triage / Clinical Relationship To Patient Self Appointment Disposition EMR Appointment Attempted - Not Scheduled Return Phone Number [phone] (Primary) Chief Complaint Blood Pressure Low Initial Comment Caller was in hospital with bronchitis and today the BP is 136/56 pulse 46. PreDisposition Did not know what to do Nurse Assessment Nurse: Harlow Mares, RN, [name] Date/Time (Eastern Time): 04/14/2014 4:23:58 PM Confirm and document reason for call. If symptomatic, describe symptoms. ---Caller was in hospital with bronchitis and today the BP is 136/56 pulse 46. Hospitalized 04/02/14-04/04/14. Reports that she came home on abx: Levofloxacin 76m she took 1 tab every other day. She completed her abx yesterday. She also states that she  is taking prednisone. She reports that she went back to her regular doses of her meds. She reports that her blood sugar was 46 this am. She hasn't felt well all day since then. She also reports that her BS rose to 112. Reports that she takes an oral agent in the am for BS. She is now taking 10U of insulin in the evening. Has the patient traveled out of the country within the last 30 days? ---No Does the patient require triage? ---Yes Related visit to physician within the last 2 weeks? ---Yes Does the PT have any chronic conditions? (i.e. diabetes, asthma, etc.) ---Yes List chronic conditions. ---COPD; DIABETES; a-fib; Wagner's dx Guidelines Guideline Title Affirmed Question Affirmed Notes Nurse Date/Time (Eastern Time) Low Blood Pressure [[4]Fall in systolic BP > 20 mm Hg from normal AND [2] NOT dizzy, lightheaded, or weak (all triage questions negative) BP sitting during triage=167/? BP standing during triage=154/? BHarlow Mares RN, Rhonda 04/14/2014 4:32:48 PM Disp. Time (Eilene GhaziTime) Disposition Final User 04/14/2014 4:21:31 PM Send to Urgent Queue [place], [place] PLEASE NOTE: All timestamps contained within this report are represented as [place]. CONFIDENTIALTY NOTICE: This fax transmission is intended only for the addressee. It contains information that is legally privileged, confidential or otherwise protected from use or disclosure. If you are not the intended recipient, you are strictly prohibited from reviewing, disclosing, copying using or disseminating any of this information or taking any action in reliance on or regarding this information. If you have received this fax in error, please notify [place] by telephone so that we can arrange for its return to [place] Phone: [phone] Toll-Free: [phone] Fax: [phone]: 2 of 2 Call Id: [id number]/11/2014 4:40:29 PM See PCP When Office is Open (within 3 days) Yes BHarlow Mares RN, RReg[employer]  Understands: Yes Disagree/Comply: Comply Care Advice Given Per Guideline SEE PCP WITHIN 3 DAYS: You need to be examined within 2 or 3 days. Call your doctor during regular office hours and make an appointment. (Note: if office will be open tomorrow, tell caller to call then, not in 3 days). REASSURANCE: * Systolic is the first number; diastolic is the second. * Your systolic blood pressure is lower than usual, but you have told me that you are not dizzy, weak, or lightheaded. CALL BACK IF: * Lightheadedness, weakness, or dizziness occurs * Systolic BP under 90 * You feel sick * You become worse. CARE ADVICE given per Low Blood Pressure (Adult) guideline. * Generally, low blood pressure is only of medical concern if it causes signs or symptoms: dizziness, fainting, shock. WHERE TO GO TO GET BP MEASURED - NOTE TO TRIAGER: If the caller is uncertain if they are taking the blood pressure correctly, or if they do not have a BP cuff, they can go to a local emergency department, urgent care center, or (sometimes) pharmacy to get their BP measured. * You can go to a local emergency department or urgent care center After Care Instructions Given Call Event Type User Date / Time Description

## 2014-04-17 ENCOUNTER — Ambulatory Visit (INDEPENDENT_AMBULATORY_CARE_PROVIDER_SITE_OTHER): Payer: Medicare Other | Admitting: Family Medicine

## 2014-04-17 ENCOUNTER — Encounter: Payer: Self-pay | Admitting: Family Medicine

## 2014-04-17 VITALS — BP 120/68 | HR 56 | Temp 98.0°F

## 2014-04-17 DIAGNOSIS — E162 Hypoglycemia, unspecified: Secondary | ICD-10-CM

## 2014-04-17 DIAGNOSIS — I10 Essential (primary) hypertension: Secondary | ICD-10-CM | POA: Diagnosis not present

## 2014-04-17 DIAGNOSIS — E871 Hypo-osmolality and hyponatremia: Secondary | ICD-10-CM

## 2014-04-17 LAB — BASIC METABOLIC PANEL
BUN: 26 mg/dL — AB (ref 6–23)
CHLORIDE: 91 meq/L — AB (ref 96–112)
CO2: 28 mEq/L (ref 19–32)
Calcium: 8.7 mg/dL (ref 8.4–10.5)
Creatinine, Ser: 1.6 mg/dL — ABNORMAL HIGH (ref 0.4–1.2)
GFR: 33.72 mL/min — ABNORMAL LOW (ref >60.00)
GLUCOSE: 102 mg/dL — AB (ref 70–99)
Potassium: 4.6 mEq/L (ref 3.5–5.1)
Sodium: 127 mEq/L — ABNORMAL LOW (ref 135–145)

## 2014-04-17 NOTE — Progress Notes (Signed)
Subjective:    Patient ID: [name] [name], female    DOB: October 20, 1932, 81 y.o.   MRN: [id number]  HPI Patient seen for medical follow-up. She had recent admission 12/29 through 04/06/2014. She has history of granulomatous polyangiitis. She had presented with some increased shortness of breath and was diagnosed with bronchitis versus community acquired pneumonia as most likely diagnosis versus exacerbation of her granulomatous polyangiitis. Influenza screen was negative. She was discharged on Levaquin and also prednisone 40 mg a day for 6 days. She feels that she is near baseline respiratory status at this point.  Concern for possible low blood pressure. She states she had blood pressure reading at home yesterday of 64/51 which obviously sounds in error. She had her sister come over and she obtained reading of 112/59 with her machine. She does not have any dizziness today. She is currently on 3 drug regimen for hypertension. She is not having consistent orthostatic symptoms  Type 2 diabetes. Recent A1c 6.8%. She's had some recent low blood sugars below 60 and she achieved consistent insulin. She takes also low-dose Amaryl 2 mg.  Past Medical History  Diagnosis Date  . HYPOTHYROIDISM 07/24/2008  . HYPERLIPIDEMIA 07/24/2008  . DEPRESSION 05/16/2009  . HYPERTENSION 07/24/2008  . Vertigo   . Diabetes mellitus type II   . Arthritis     r tkr  . CVA (cerebral vascular accident) 2008    mini stroke.no residual  . Heart murmur     stress test 2009.dr peter [place]  . Intermittent vertigo   . Skin abnormality     facial lesions .pt applying mupiracin to areas  . Atrial fibrillation   . Breast cancer   . Pneumonia   . 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 Knee surgery  2009    TKR  . Cholecystectomy  1980  . Joint replacement      r knee  . Breast surgery  2007    Lumpectomy, XRT 2006.l breast  . Video assisted thoracoscopy  04/22/2011   Procedure: VIDEO ASSISTED THORACOSCOPY;  Surgeon: Melrose Nakayama, MD;  Location: [place];  Service: Thoracic;  Laterality: Left;  WITH BIOPSY  . Heel spur surgery Right   . Exploratory laparotomy      reports that she quit smoking about 31 years ago. Her smoking use included Cigarettes. She has a 6 pack-year smoking history. She has never used smokeless tobacco. She reports that she does not drink alcohol or use illicit drugs. family history includes Arthritis in her mother; Breast cancer in her sister; Cancer in her sister; Coronary artery disease in her brother and father; Heart disease in her father; Lung cancer in her brother; Rheum arthritis in her mother. Allergies  Allergen Reactions  . Codeine Sulfate Other (See Comments)    REACTION: GI upset  . Sulfonamide Derivatives Other (See Comments)    REACTION: GI upset  . Atarax [Hydroxyzine] Nausea Only and Rash      Review of Systems  Constitutional: Negative for chills, appetite change, fatigue and unexpected weight change.  Eyes: Negative for visual disturbance.  Respiratory: Negative for cough, chest tightness and wheezing.   Cardiovascular: Negative for chest pain, palpitations and leg swelling.  Endocrine: Negative for polydipsia and polyuria.  Neurological: Negative for dizziness, seizures, syncope, weakness, light-headedness and headaches.       Objective:   Physical Exam  Constitutional: She appears well-developed and well-nourished.  Neck: Neck supple. No JVD  present.  Cardiovascular: Normal rate.  Exam reveals no gallop.   Pulmonary/Chest: Effort normal. She has no rales.  She has a few scattered wheezes. Normal respiratory rate. No respiratory distress  Musculoskeletal: She exhibits no edema.  Psychiatric: She has a normal mood and affect. Her behavior is normal.          Assessment & Plan:  #1 type 2 diabetes. Recent hypoglycemia. We explained that both her Amaryl and insulin could be factors. We have  suggested stopping her Amaryl but she states that when she took only Amaryl her blood sugars were excellent. We'll have her drop her Lantus and monitor closely #2 hypertension. Currently stable. Suspect machine error for low blood pressure reading yesterday. Continue to monitor #3 history of chronic hyponatremia. She is no longer on thiazide diuretic. Recheck basic metabolic panel

## 2014-04-17 NOTE — Patient Instructions (Signed)
Stop Lantus and monitor blood sugars and be in touch if consistently > 140.

## 2014-04-17 NOTE — Progress Notes (Signed)
Pre visit review using our clinic review tool, if applicable. No additional management support is needed unless otherwise documented below in the visit note.

## 2014-04-24 ENCOUNTER — Other Ambulatory Visit: Payer: Self-pay | Admitting: Family Medicine

## 2014-04-24 NOTE — Telephone Encounter (Signed)
Refill Diazepam once.  Avoid regular use. She takes this prn vertigo.

## 2014-04-24 NOTE — Telephone Encounter (Signed)
Last visit 04/17/14 Last refill 08/25/13 #30 1 refill

## 2014-05-01 ENCOUNTER — Other Ambulatory Visit: Payer: Self-pay | Admitting: Family Medicine

## 2014-05-08 ENCOUNTER — Telehealth: Payer: Self-pay | Admitting: Cardiology

## 2014-05-08 NOTE — Telephone Encounter (Signed)
Pt still waiting for her Eliquis. Would you please call it in today to [phone].

## 2014-05-09 ENCOUNTER — Other Ambulatory Visit: Payer: Self-pay | Admitting: Family Medicine

## 2014-05-09 ENCOUNTER — Other Ambulatory Visit: Payer: Self-pay | Admitting: *Deleted

## 2014-05-09 MED ORDER — APIXABAN 5 MG PO TABS: 5.0000 mg | ORAL_TABLET | Freq: Two times a day (BID) | ORAL | Status: DC

## 2014-05-10 NOTE — Telephone Encounter (Signed)
Pt calling again says she still not received her Eliqius. Please call this in today to [phone].

## 2014-05-11 ENCOUNTER — Other Ambulatory Visit: Payer: Self-pay

## 2014-05-11 MED ORDER — APIXABAN 5 MG PO TABS: 5.0000 mg | ORAL_TABLET | Freq: Two times a day (BID) | ORAL | Status: DC

## 2014-05-16 ENCOUNTER — Ambulatory Visit: Payer: Medicare Other | Admitting: Family Medicine

## 2014-05-17 ENCOUNTER — Encounter: Payer: Self-pay | Admitting: Family Medicine

## 2014-05-17 ENCOUNTER — Ambulatory Visit: Payer: Medicare Other | Admitting: Family Medicine

## 2014-05-17 ENCOUNTER — Ambulatory Visit (INDEPENDENT_AMBULATORY_CARE_PROVIDER_SITE_OTHER): Payer: Medicare Other | Admitting: Family Medicine

## 2014-05-17 VITALS — BP 126/68 | HR 74 | Temp 97.8°F

## 2014-05-17 DIAGNOSIS — N183 Chronic kidney disease, stage 3 unspecified: Secondary | ICD-10-CM

## 2014-05-17 DIAGNOSIS — E038 Other specified hypothyroidism: Secondary | ICD-10-CM | POA: Diagnosis not present

## 2014-05-17 DIAGNOSIS — L299 Pruritus, unspecified: Secondary | ICD-10-CM | POA: Diagnosis not present

## 2014-05-17 LAB — BASIC METABOLIC PANEL
BUN: 22 mg/dL (ref 6–23)
CHLORIDE: 95 meq/L — AB (ref 96–112)
CO2: 28 mEq/L (ref 19–32)
Calcium: 9.4 mg/dL (ref 8.4–10.5)
Creatinine, Ser: 1.38 mg/dL — ABNORMAL HIGH (ref 0.40–1.20)
GFR: 39.13 mL/min — ABNORMAL LOW (ref >60.00)
Glucose, Bld: 124 mg/dL — ABNORMAL HIGH (ref 70–99)
Potassium: 4.8 mEq/L (ref 3.5–5.1)
Sodium: 130 mEq/L — ABNORMAL LOW (ref 135–145)

## 2014-05-17 LAB — CBC WITH DIFFERENTIAL/PLATELET
BASOS ABS: 0 10*3/uL (ref 0.0–0.1)
Basophils Relative: 0.4 % (ref 0.0–3.0)
EOS ABS: 0.1 10*3/uL (ref 0.0–0.7)
Eosinophils Relative: 1 % (ref 0.0–5.0)
HCT: 29.8 % — ABNORMAL LOW (ref 36.0–46.0)
Hemoglobin: 10.1 g/dL — ABNORMAL LOW (ref 12.0–15.0)
LYMPHS ABS: 1.3 10*3/uL (ref 0.7–4.0)
LYMPHS PCT: 20.5 % (ref 12.0–46.0)
MCHC: 33.9 g/dL (ref 30.0–36.0)
MCV: 91.6 fl (ref 78.0–100.0)
Monocytes Absolute: 0.4 10*3/uL (ref 0.1–1.0)
Monocytes Relative: 6.2 % (ref 3.0–12.0)
Neutro Abs: 4.7 10*3/uL (ref 1.4–7.7)
Neutrophils Relative %: 71.9 % (ref 43.0–77.0)
Platelets: 264 10*3/uL (ref 150.0–400.0)
RBC: 3.25 Mil/uL — ABNORMAL LOW (ref 3.87–5.11)
RDW: 16.4 % — AB (ref 11.5–15.5)
WBC: 6.6 10*3/uL (ref 4.0–10.5)

## 2014-05-17 LAB — HEPATIC FUNCTION PANEL
ALT: 12 U/L (ref 0–35)
AST: 20 U/L (ref 0–37)
Albumin: 4 g/dL (ref 3.5–5.2)
Alkaline Phosphatase: 69 U/L (ref 39–117)
BILIRUBIN TOTAL: 0.7 mg/dL (ref 0.2–1.2)
Bilirubin, Direct: 0.1 mg/dL (ref 0.0–0.3)
Total Protein: 7.2 g/dL (ref 6.0–8.3)

## 2014-05-17 LAB — TSH: TSH: 0.71 u[IU]/mL (ref 0.35–4.50)

## 2014-05-17 MED ORDER — LEVOCETIRIZINE DIHYDROCHLORIDE 5 MG PO TABS: 5.0000 mg | ORAL_TABLET | Freq: Every evening | ORAL | Status: DC

## 2014-05-17 NOTE — Progress Notes (Signed)
Pre visit review using our clinic review tool, if applicable. No additional management support is needed unless otherwise documented below in the visit note.

## 2014-05-17 NOTE — Progress Notes (Signed)
Subjective:    Patient ID: [name] [name], female    DOB: 03/21/1933, 81 y.o.   MRN: [id number]  HPI Patient seen with three-week history of diffuse pruritus basically from head to toe. She's had a few small bumps where she has scratched but has not seen any hives or distinct rash. She uses [employer] and has so for quite some time. She's tried [employer] anti-itch lotion with temporary relief. She uses moisturizer creams. She's not had any recent change of detergent or fabric softener. No recent change of medications. Her itching has been severe at times. She denies any nausea, vomiting, abdominal pain, cough, dyspnea. She has multiple chronic problems as outlined below including chronic kidney disease, hypothyroidism recently over placed, Wegener's granulomatosis, atrial fibrillation  Past Medical History  Diagnosis Date  . HYPOTHYROIDISM 07/24/2008  . HYPERLIPIDEMIA 07/24/2008  . DEPRESSION 05/16/2009  . HYPERTENSION 07/24/2008  . Vertigo   . Diabetes mellitus type II   . Arthritis     r tkr  . CVA (cerebral vascular accident) 2008    mini stroke.no residual  . Heart murmur     stress test 2009.dr peter [place]  . Intermittent vertigo   . Skin abnormality     facial lesions .pt applying mupiracin to areas  . Atrial fibrillation   . Breast cancer   . Pneumonia   . 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 Knee surgery  2009    TKR  . Cholecystectomy  1980  . Joint replacement      r knee  . Breast surgery  2007    Lumpectomy, XRT 2006.l breast  . Video assisted thoracoscopy  04/22/2011    Procedure: VIDEO ASSISTED THORACOSCOPY;  Surgeon: Melrose Nakayama, MD;  Location: [facility];  Service: Thoracic;  Laterality: Left;  WITH BIOPSY  . Heel spur surgery Right   . Exploratory laparotomy      reports that she quit smoking about 31 years ago. Her smoking use included Cigarettes. She has a 6 pack-year smoking history. She has never used  smokeless tobacco. She reports that she does not drink alcohol or use illicit drugs. family history includes Arthritis in her mother; Breast cancer in her sister; Cancer in her sister; Coronary artery disease in her brother and father; Heart disease in her father; Lung cancer in her brother; Rheum arthritis in her mother. Allergies  Allergen Reactions  . Codeine Sulfate Other (See Comments)    REACTION: GI upset  . Sulfonamide Derivatives Other (See Comments)    REACTION: GI upset  . Atarax [Hydroxyzine] Nausea Only and Rash      Review of Systems  Constitutional: Negative for fever and chills.  HENT: Negative for congestion.   Respiratory: Negative for cough and shortness of breath.   Gastrointestinal: Negative for nausea, vomiting and abdominal pain.  Genitourinary: Negative for dysuria.  Neurological: Negative for dizziness.       Objective:   Physical Exam  Constitutional: She appears well-developed and well-nourished.  Neck: Neck supple.  Cardiovascular: Normal rate and regular rhythm.   Pulmonary/Chest: Effort normal.  Musculoskeletal: She exhibits no edema.  Lymphadenopathy:    She has no cervical adenopathy.  Skin:  She has a few excoriations on her anterior knee bilaterally otherwise no rash. Skin is only minimally dry          Assessment & Plan:  #1 diffuse pruritus. Etiology uncertain. No visible rash.  No recent  change of soaps or detergents. She does have multiple chronic problems. Doubt uremia, cholestasis, over replaced thyroid as explanation but will check labs including basic panel, hepatic panel, CBC, TSH. She will try XYZAL 5 mg once daily #2 hypothyroidism recently over placed. We adjusted her dose and recheck TSH today

## 2014-05-17 NOTE — Patient Instructions (Signed)
If Xyzal not covered would try over the counter Allegra or Zyrtec for the itch.

## 2014-05-24 ENCOUNTER — Other Ambulatory Visit: Payer: Self-pay

## 2014-05-24 DIAGNOSIS — Z1231 Encounter for screening mammogram for malignant neoplasm of breast: Secondary | ICD-10-CM

## 2014-05-26 ENCOUNTER — Encounter: Payer: Self-pay | Admitting: Cardiology

## 2014-05-26 ENCOUNTER — Ambulatory Visit (INDEPENDENT_AMBULATORY_CARE_PROVIDER_SITE_OTHER): Payer: Medicare Other | Admitting: Cardiology

## 2014-05-26 VITALS — BP 130/64 | HR 62 | Ht 66.0 in | Wt 194.9 lb

## 2014-05-26 DIAGNOSIS — I482 Chronic atrial fibrillation, unspecified: Secondary | ICD-10-CM

## 2014-05-26 DIAGNOSIS — I5032 Chronic diastolic (congestive) heart failure: Secondary | ICD-10-CM

## 2014-05-26 DIAGNOSIS — I10 Essential (primary) hypertension: Secondary | ICD-10-CM | POA: Diagnosis not present

## 2014-05-26 DIAGNOSIS — I270 Primary pulmonary hypertension: Secondary | ICD-10-CM

## 2014-05-26 DIAGNOSIS — I272 Pulmonary hypertension, unspecified: Secondary | ICD-10-CM

## 2014-05-26 NOTE — Progress Notes (Signed)
[facility]. [address], [address], [place]  [address] Phone: [phone] Fax:  [phone]  Date:  05/26/2014   ID:  [name] [name], DOB [birth year], MRN [id number]  PCP:  [name], MD    History of Present Illness: [name] [name] is a 81 y.o. female with a hx of HTN, DM2, prior TIA, HL, breast CA, atrial fibrillation.  She is on Eliquis (CHADS2-VASc=6) for anticoagulation. She has a  dx of granulomatosis with polyangiitis (Wegener's).   Echo 04/14/13  EF 60-65%, trivial AI, mild-mod MR, mod to severe LAE, mild RVE, mod RAE, moderate TR,  PASP- 60 mm Hg.     Since her last visit she was hospitalized with bronchitis in Decemberr associated with hypoxemia, cough, and fever. She responded well to antibiotics and increased steroids. Breathing is back to baseline. She is still wearing oxygen at night.  She denies any recurrent symptoms of cough or hemoptysis. She is taking lasix 20 mg daily. No edema. She avoids salt and enjoys eating fruits and vegetables. She is being considered for surgery to repair a ventral hernia.   Wt Readings from Last 3 Encounters:  05/26/14 194 lb 14.4 oz (88.406 kg)  04/06/14 184 lb 8.4 oz (83.7 kg)  02/09/14 189 lb 6.4 oz (85.911 kg)     Past Medical History  Diagnosis Date  . HYPOTHYROIDISM 07/24/2008  . HYPERLIPIDEMIA 07/24/2008  . DEPRESSION 05/16/2009  . HYPERTENSION 07/24/2008  . Vertigo   . Diabetes mellitus type II   . Arthritis     r tkr  . CVA (cerebral vascular accident) 2008    mini stroke.no residual  . Heart murmur     stress test 2009.dr peter [place]  . Intermittent vertigo   . Skin abnormality     facial lesions .pt applying mupiracin to areas  . Atrial fibrillation   . Breast cancer   . Pneumonia   . Wegener's disease, pulmonary 10/2012    Current Outpatient Prescriptions  Medication Sig Dispense Refill  . amLODipine (NORVASC) 5 MG tablet Take 1 tablet (5 mg total) by mouth daily. 30 tablet 5  . apixaban (ELIQUIS) 5 MG TABS  tablet Take 1 tablet (5 mg total) by mouth 2 (two) times daily. 180 tablet 1  . CALCIUM CITRATE PO Take 1,200 mg by mouth daily.      . cholecalciferol (VITAMIN D) 1000 UNITS tablet Take 3,000 Units by mouth daily.      . diazepam (VALIUM) 5 MG tablet TAKE ONE TABLET BY MOUTH EVERY 12 HOURS AS NEEDED ANXIETY 30 tablet 0  . furosemide (LASIX) 20 MG tablet TAKE ONE TABLET BY MOUTH ONCE DAILY 90 tablet 1  . gabapentin (NEURONTIN) 100 MG capsule Take 1 capsule (100 mg total) by mouth 2 (two) times daily before a meal. 60 capsule 6  . glimepiride (AMARYL) 2 MG tablet Take 1 tablet (2 mg total) by mouth daily before breakfast. 30 tablet 11  . levocetirizine (XYZAL) 5 MG tablet Take 1 tablet (5 mg total) by mouth every evening. 30 tablet 3  . levothyroxine (SYNTHROID, LEVOTHROID) 112 MCG tablet TAKE ONE TABLET BY MOUTH ONCE DAILY BEFORE BREAKFAST 90 tablet 1  . loratadine (CLARITIN) 10 MG tablet Take 10 mg by mouth daily as needed for allergies.    Marland Kitchen nebivolol (BYSTOLIC) 10 MG tablet Take 1 tablet (10 mg total) by mouth daily after breakfast. 30 tablet 5  . predniSONE (DELTASONE) 20 MG tablet Alternate between 1/2 tab and 1/4 tab every  other day 30 tablet 0  . pyridOXINE (VITAMIN B-6) 100 MG tablet Take 100 mg by mouth every morning.    . rosuvastatin (CRESTOR) 5 MG tablet Take 1 tablet (5 mg total) by mouth daily. 90 tablet 3  . valsartan (DIOVAN) 160 MG tablet Take 1 tablet (160 mg total) by mouth daily. 30 tablet 5   No current facility-administered medications for this visit.    Allergies:    Allergies  Allergen Reactions  . Codeine Sulfate Other (See Comments)    REACTION: GI upset  . Sulfonamide Derivatives Other (See Comments)    REACTION: GI upset  . Vancomycin Other (See Comments)    Red man syndrome  . Atarax [Hydroxyzine] Nausea Only and Rash    Social History:  The patient  reports that she quit smoking about 31 years ago. Her smoking use included Cigarettes. She has a 6 pack-year  smoking history. She has never used smokeless tobacco. She reports that she does not drink alcohol or use illicit drugs.   ROS:  Please see the history of present illness.   No hemoptysis.   All other systems reviewed and negative.   PHYSICAL EXAM: VS:  BP 130/64 mmHg  Pulse 62  Ht _0  (1.676 m)  Wt 194 lb 14.4 oz (88.406 kg)  BMI 31.47 kg/m2 Well nourished, well developed, in no acute distress. She is seen in a wheelchair. HEENT: normal Neck: no JVD or bruits Cardiac:  normal S1, S2; irregularly irregular rhythm; no murmur or gallop Lungs:  Decreased breath sounds bilaterally. Scant crackles in bases. Abd: soft, nontender Ext: no ankle edema Skin: warm and dry Neuro:  CNs 2-12 intact, no focal abnormalities noted  Laboratory data:  Lab Results  Component Value Date   WBC 6.6 05/17/2014   HGB 10.1* 05/17/2014   HCT 29.8* 05/17/2014   PLT 264.0 05/17/2014   GLUCOSE 124* 05/17/2014   CHOL 135 01/16/2014   TRIG 114.0 01/16/2014   HDL 47.80 01/16/2014   LDLDIRECT 75.3 10/21/2011   LDLCALC 64 01/16/2014   ALT 12 05/17/2014   AST 20 05/17/2014   NA 130* 05/17/2014   K 4.8 05/17/2014   CL 95* 05/17/2014   CREATININE 1.38* 05/17/2014   BUN 22 05/17/2014   CO2 28 05/17/2014   TSH 0.71 05/17/2014   INR 1.70* 08/18/2013   HGBA1C 6.8* 04/04/2014   MICROALBUR 1.7 08/15/2009   Ecg 04/04/14 shows Afib rate 75. No acute changes.  ASSESSMENT AND PLAN:  1. Atrial Fibrillation:  Rate controlled.  No  hemoptysis.  She is on the correct dose of Eliquis for her (age > 99 and weight > 60 kg).  CHADS2-VASc=6.  She would be at significant risk of stroke if she has to stop anticoagulation. 2. Pulmonary HTN:  Likely related to sequelae of Wegener's. 3. Hypertension:  BP well controlled.  Continue to monitor. 4. Hyperlipidemia:  Continue statin. 5. Granulomatosis with Polyangiitis (Wegener's):  Continue follow up with pulmonary and oncology as directed.   6. PNA-resolved. 7. Chronic  diastolic dysfunction. Well compensated  If surgery is considered for her ventral hernia she is at low to moderate risk from a cardiac standpoint but her real risk would be from her pulmonary disease and Dr. Annamaria Boots would need to assess. She would need to stop Eliquis for 48 hours prior to surgery.

## 2014-05-26 NOTE — Patient Instructions (Signed)
Continue your current therapy  I will see you in 6 months.

## 2014-05-29 ENCOUNTER — Telehealth: Payer: Self-pay | Admitting: Family Medicine

## 2014-05-29 NOTE — Telephone Encounter (Signed)
Pt informed.

## 2014-05-29 NOTE — Telephone Encounter (Addendum)
Pt seen on 2/10 for itching.  The med pt prescribed has not helped at all. Pt still itching. pls advise Would like to know if she can the benadryl. She has tried everything else. [facility] [address]

## 2014-05-29 NOTE — Telephone Encounter (Signed)
May try benadryl with caution for possible sedation.

## 2014-06-01 ENCOUNTER — Other Ambulatory Visit: Payer: Self-pay | Admitting: Nurse Practitioner

## 2014-06-07 DIAGNOSIS — K432 Incisional hernia without obstruction or gangrene: Secondary | ICD-10-CM | POA: Diagnosis not present

## 2014-06-08 ENCOUNTER — Other Ambulatory Visit: Payer: Self-pay | Admitting: Family Medicine

## 2014-06-08 ENCOUNTER — Telehealth: Payer: Self-pay | Admitting: Family Medicine

## 2014-06-08 DIAGNOSIS — E669 Obesity, unspecified: Secondary | ICD-10-CM

## 2014-06-08 DIAGNOSIS — I482 Chronic atrial fibrillation, unspecified: Secondary | ICD-10-CM

## 2014-06-08 DIAGNOSIS — I272 Pulmonary hypertension, unspecified: Secondary | ICD-10-CM

## 2014-06-08 DIAGNOSIS — J441 Chronic obstructive pulmonary disease with (acute) exacerbation: Secondary | ICD-10-CM

## 2014-06-08 DIAGNOSIS — E1121 Type 2 diabetes mellitus with diabetic nephropathy: Secondary | ICD-10-CM

## 2014-06-08 NOTE — Telephone Encounter (Signed)
Pt states her wheelchairs worn out/seat is broken and cannot be fixed. Pt needs order for a new wheelchair  Pt spoke w/ [facility] and they states to fax order to them and they will take it from there. [facility]  [phone]  fax [phone]

## 2014-06-08 NOTE — Telephone Encounter (Signed)
Wheelchair ordered.

## 2014-06-08 NOTE — Addendum Note (Signed)
Addended by: Noe Gens E on: 06/08/2014 03:00 PM   Modules accepted: Orders

## 2014-06-08 NOTE — Telephone Encounter (Signed)
Is okay to order.

## 2014-06-08 NOTE — Telephone Encounter (Signed)
OK 

## 2014-06-09 ENCOUNTER — Other Ambulatory Visit: Payer: Self-pay | Admitting: Family Medicine

## 2014-06-12 ENCOUNTER — Other Ambulatory Visit: Payer: Self-pay | Admitting: Family Medicine

## 2014-06-12 NOTE — Telephone Encounter (Signed)
Refill once.   She has taken this in past for vertigo.  Stress to patient would only be taking for severe vertigo symptoms.  Do not take regularly.

## 2014-06-12 NOTE — Telephone Encounter (Signed)
Last visit 05/17/14 Last refill 04/24/14 #30 0 refill

## 2014-06-14 ENCOUNTER — Other Ambulatory Visit: Payer: Self-pay | Admitting: *Deleted

## 2014-06-14 ENCOUNTER — Ambulatory Visit: Payer: Medicare Other | Admitting: Family Medicine

## 2014-06-14 MED ORDER — VALSARTAN 160 MG PO TABS: 160.0000 mg | ORAL_TABLET | Freq: Every day | ORAL | Status: DC

## 2014-06-22 ENCOUNTER — Ambulatory Visit
Admission: RE | Admit: 2014-06-22 | Discharge: 2014-06-22 | Disposition: A | Discharge status: Home / Self Care | Payer: Medicare Other | Source: Ambulatory Visit

## 2014-06-22 DIAGNOSIS — Z1231 Encounter for screening mammogram for malignant neoplasm of breast: Secondary | ICD-10-CM

## 2014-06-29 ENCOUNTER — Other Ambulatory Visit: Payer: Self-pay | Admitting: Family Medicine

## 2014-07-11 ENCOUNTER — Ambulatory Visit (INDEPENDENT_AMBULATORY_CARE_PROVIDER_SITE_OTHER): Payer: Medicare Other | Admitting: Internal Medicine

## 2014-07-11 ENCOUNTER — Encounter: Payer: Self-pay | Admitting: Internal Medicine

## 2014-07-11 VITALS — BP 122/90 | HR 63 | Ht 66.0 in | Wt 197.0 lb

## 2014-07-11 DIAGNOSIS — J42 Unspecified chronic bronchitis: Secondary | ICD-10-CM

## 2014-07-11 DIAGNOSIS — I270 Primary pulmonary hypertension: Secondary | ICD-10-CM | POA: Diagnosis not present

## 2014-07-11 DIAGNOSIS — I272 Pulmonary hypertension, unspecified: Secondary | ICD-10-CM

## 2014-07-11 NOTE — Assessment & Plan Note (Signed)
Continues oxygen at night but may no longer needed. If she is borderline we will probably continue because of her history of pulmonary hypertension. Plan-overnight oximetry on room air then resume oxygen at 2 L for sleep

## 2014-07-11 NOTE — Patient Instructions (Addendum)
Order- [facility] room air   Dx pulmonary hypertension  After the overnight oximetry test is done on room air, then start back to wearing oxygen for sleep every night as you have been doing.

## 2014-07-11 NOTE — Progress Notes (Signed)
04/03/11- 81 year old female former smoking history referred courtesy of Dr. Elease Hashimoto with concern of abnormal chest CT. Husband is an allergy patient here. Dr. Erick Blinks recent office note reviewed "Followup from recent pneumonia. Patient had presented here with extreme myalgias and chronic pain along with bilateral shoulder hip pain. Very high sedimentation rate of 120.  She almost total resolution of myalgias with low dose prednisone strongly suggesting polymyalgia rheumatica. She subsequently presented with cough with trace of blood but no dyspnea, fever, reported a pain. Chest x-ray showed bilateral infiltrates suggesting probable bilateral pneumonia the recommendation for CT scan. CT scan revealed no adenopathy and no evidence for a lung mass. Compatible with bilateral pneumonia. Patient placed on antibiotic and much better at this time with no fever and essentially no cough. Her muscle and joint pains are almost 100% resolved with low-dose prednisone. She did present back in September with severe sinus disease suggesting acute sinusitis. CT revealed no acute findings. She denies any arthralgias at this time. No skin rash. No fever." She has been on prednisone 10 mg twice daily since December 13 and feels significantly better. Morning cough has cleared with residual trace phlegm and trace blood. No chest pain. CT chest 03/23/2011 -images reviewed by me-bilateral patchy airspace disease with air bronchograms. No cavitation or definite nodularity seen. She has had right total knee replacement but no generalized arthritis. Childhood exposure to an uncle with tuberculosis. Her PPD skin test was negative. She denies history of lung disease, GERD, DVT, kidney or liver disease. Lab- C-ANCA POS 1:320, P-ANCA NEG. RA 1:28, ANA NEG73 year old female former smoking history referred courtesy of Dr. Elease Hashimoto with concern of abnormal chest CT. Husband is an allergy patient here.  05/13/11- 56 yoF former smoker  followed for bilateral pulmonary infiltrates, incr C-ANCA, s/p VATS bx/ organizing pneumonia, Metastatic intestinal carcinoid. Marland Kitchen Hx breast Ca 2006/ Dr Truddie Coco Husband here. She returns now after left lung biopsy. She has expected left thoracotomy pain but is otherwise stable as she continues maintenance prednisone. Denies blood, fever, swollen glands, discolored sputum. Exertional dyspnea is not worse, allowing for the surgery. I shared with her and her husband the available initial pathology: Patchy organizing pneumonia with chronic interstitial pneumonia and focal atypical glands. I reviewed the discussion about her at thoracic oncology conference. The parenchymal lung process is not a vasculitis as I originally questioned. I told her there was an incidental finding of cancer cells with further identification pending but that this was a separate process. We agreed to refer her to Dr. Truddie Coco who managed her breast cancer. After she had left, personal communication from Dr. Jimmy Footman- neuroendocrine tumor staining consistent with metastatic intestinal carcinoid.  06/20/11  24 yoF former smoker followed for bilateral pulmonary infiltrates, incr C-ANCA, s/p VATS bx/ organizing pneumonia, Metastatic intestinal carcinoid. Marland Kitchen Hx breast Ca 2006/ Dr Truddie Coco   Husband here She says her breathing is now "fine" back to her normal. She denies fever, cough, sputum. I spoke with Mrs. Carrender myself and had to explain the discovery of carcinoid after her lung biopsy. We also have the note from the [facility] indicating they discussed it with her. She has absolutely no recollection. Little cough or shortness of breath now. Still a little posterior thoracotomy incision pain. CT 05/19/11- images reviewed with them- IMPRESSION:  1. Marked improvement in bilateral air space disease seen on  comparison CT of 03/24/2011. Near complete resolution.  2. Postoperative change in the left hemithorax consistent with  recent VATS.   Original Report  Authenticated By: Suzy Bouchard, M.D.   11/07/11- 57 yoF former smoker followed for bilateral pulmonary infiltrates, incr C-ANCA, s/p VATS bx/ organizing pneumonia, Metastatic intestinal carcinoid. Marland Kitchen Hx breast Ca 2006/ Dr Truddie Coco    c/o  Chronic vertigo, difficulty breathing and gets tired easy. Also some wheezing, post nasal,  and cough.  Some days she feels much better than others, no real pattern. Occasional light cough to clear throat. We had called in prednisone, which did help. Ambulatory around home, but gets out little. CT chest 09/09/11- I reviewed w/ her- there is significant patchy bilateral lower zone fibrotic scarring and some bronchial thickening. IMPRESSION:  1. No evidence of thoracic metastatic disease or other active  process.  2. Small mild cardiomegaly and hiatal hernia.  Original Report Authenticated By: Marlaine Hind, M.D.    02/09/12-  27 yoF former smoker followed for bilateral pulmonary infiltrates, incr C-ANCA, s/p VATS bx/ cryptogenic organizing pneumonia/ BOOP, Metastatic intestinal carcinoid. Marland Kitchen Hx breast Ca 2006/ Dr Truddie Coco  January had biopsy to ck for other lung condition. SOB and wheezing at times. Left sided pain around the  Back. Continues Advair 250. Due for followup chest CT in December for Dr. Jacquiline Doe. Not on prednisone. She says breathing is unchanged. Denies pain, blood or swollen nodes.   05/11/12-  89 yoF former smoker followed for bilateral pulmonary infiltrates, incr C-ANCA, s/p VATS bx/ cryptogenic organizing pneumonia/ BOOP, Metastatic intestinal carcinoid. Complicated by Hx breast Ca 2006/ Dr Truddie Coco. AFib/ Dr Martinique, DM. She says her husband is developing dementia. Deep breath makes her left flank feel tight where she had thoracentesis. Complains of itching since starting Eliquis.Atarax 50 mg was too strong and Benadryl did not help. Itching keeps her awake all night. No visible rash. She is convinced really unpleasant itching began when  she started Eliquis. I explained this medication needs to be transitioned carefully to anything else and Dr. Martinique would need to be the one to manage that. CT chest 03/26/12 . IMPRESSION:  1. No findings to strongly suggest metastatic disease to the  thorax.  2. There is a new focus of nodular ground-glass attenuation  measuring 1 cm in the anterior aspect of the left upper lobe which  is highly nonspecific. Initial follow-up by chest CT without  contrast is recommended in 3 months to confirm persistence. This  recommendation follows the consensus statement: Recommendations for  the Management of Subsolid Pulmonary Nodules Detected at CT: A  Statement from the [employer] Society as published in Radiology  2013; 266:304-317.  3. Atherosclerosis, including left main and three-vessel coronary  artery disease. Assessment for potential risk factor  modification, dietary therapy or pharmacologic therapy may be  warranted, if clinically indicated.  4. Slight increase in the small amount of pericardial fluid  compared to the prior examination. This is unlikely to be of  hemodynamic significance at this time.  5. Moderate cardiomegaly.  6. Additional incidental findings, similar to prior studies, as  discussed above.  Original Report Authenticated By: Vinnie Langton, M.D.   08/08/12- 15 yoF former smoker followed for bilateral pulmonary infiltrates/ nodules, incr C-ANCA, s/p VATS bx/ cryptogenic organizing pneumonia/ BOOP, Metastatic intestinal carcinoid. Complicated by Hx breast Ca 2006/ Dr Truddie Coco. AFib/ Dr Martinique, DM. Itching stopped, while continuing Eliquis. She had changed detergents and fabric softener, but cause unknown. Seasonal rhinitis symptoms now. Total IgE 05/11/2012 was less than 1.5. New burning itch, intermittent, right lateral ribs x2 weeks. Has had shingles shot. No visible rash. CT chest  08/02/12 IMPRESSION:  Interval persistence but decrease in size of the ground-glass   nodules seen in the anterior left upper lobe. Adenocarcinoma  cannot be excluded. Followup by CT is recommended in 12 months,  with continued annual surveillance for a minimum of 3 years.  These recommendations are taken from "Recommendations for the  Management of Subsolid Pulmonary Nodules Detected at CT: A  Statement from the [employer]" Radiology 2013; 266:1, 304-  317.  Original Report Authenticated By: Misty Stanley, M.D.   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 Relapse since 2013 11/02/12 PR-3 880. MPO negative. - GBM neg Likely - capillaritis. 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 Admitted with hemoptysis  hemoptysis with CT scan chest findings of alveolar hemorrhage  As of June 2014 the oncologist did not feel there was any evidence of breast cancer recurrence.CT scan of the chest ruled out pulmonary embolism but showed severe bilateral groundglass opacities consistent with alveolar hemorrhage pattern. Pulmonary consultation was called. She was initially treated by stopping eliquis, providing empiric antibiotics and supported with diuresis.  Pulmonary  felt The clinical picture was felt to be consistent with diffuse alveolar hemorrhage resulting in hemoptysis. Most likely cause of alveolar hemorrhage was felt to be autoimmune based on past history of autoimmune antibody positivity and lung biopsy showing organizing pneumonia in December 2012/January 2013. Prior C.-ANCA positive though no evidence of vasculitis in Jan 2013 lung bx which is puzzling. Alternative is relapsing BOOP but the hemoptysis would be unsual unless precipitated by Eliquis. On 11/02/12 PR-3 880. Nephrology consult  On  7/29 immunosuppression therapy was initiated. She was given Pulse methylprednisolone, for three days, and her first dose of Rituxan was given on 7/30. The following days we saw significant clinical improvement in regards to hemoptysis, pulmonary infiltrates and dyspnea, She was started on  Rituxan over cyclophosphamide due to malignancy history, and also this is technically a "relapse" or A "flare". Dose starting 11/03/12 will be Induction therapy with rituximab (375 mg/m2 per week for four weeks). W/  PCP prophylaxis w/ - Bactrim 1 DS on M, W, F  Since discharge she is feeling better with resolution of hemoptysis, . Dyspnea is less and she is starting to get some of her energy back.  No fever , orthopnea or edema.  Had labs done last week. , hbg stable at 8.9 , renal fx tr down at 1.6 (max scr 2.4) . Ov with oncology last week. Rituxan  infusion given.   12/03/12- 85 yoF former smoker followed for bilateral pulmonary infiltrates/ nodules, incr C-ANCA, s/p VATS bx/ Granulomatous Polyangiitis (Wegener's) vs cryptogenic organizing pneumonia/ BOOP, Metastatic intestinal carcinoid. Complicated by Hx breast Ca 2006/ Dr Truddie Coco. AFib/ Dr Martinique, DM. Started Rituxan 375 mg/m2x 4 weeks 11/03/12. PCP prophy Bactrim 1DS on M,W,F. FOLLOWS FOR: reports doing well since ov w/ TP.  still doing well on Dulera, but needs a refill.  still taking prednisone 9m daily. 1 dose of Rituxan still pending. Had to have transfusion last week for hemoglobin 7.7. No hemoptysis since a little bit at first recognition of her granulomatous disease. She has continued prednisone 60 mg daily since hospital discharge, and we have discussed tapering the dose. No coughing, no chest pain or fever.  01/17/13- 57 yoF former smoker followed for bilateral pulmonary infiltrates/ nodules, incr C-ANCA, s/p VATS bx/ Granulomatous Polyangiitis (Wegener's) vs cryptogenic organizing pneumonia/ BOOP, Metastatic intestinal carcinoid. Complicated by Hx breast  Ca 2006/ Dr Truddie Coco. AFib/ Dr Martinique, DM. FOLLOWS FOR:  Breathing has improved since last OV needs refill on dulera Had flu vaccine Paces herself. Feels she is getting stronger. Has not needed Dulera in 2 or 3 weeks.  04/22/14- 50 yoF former smoker followed for bilateral pulmonary infiltrates/ nodules, incr C-ANCA, s/p VATS bx/ Granulomatous Polyangiitis (Wegener's) vs cryptogenic organizing pneumonia/ BOOP, Metastatic intestinal carcinoid. Complicated by Hx breast Ca 2006/ Dr Truddie Coco. AFib/ Dr Martinique, DM. FOLLOWS FOR: recently in hospital; bronchitis Hospital 1/7-1/12/15- acute bronchitis/respiratory failure/probably viral. There may have been fluid overload, she feels better now on daily Lasix. Prednisone now at 1/2x20 mg daily. CT chest 04/14/13 IMPRESSION:  1. Stable cardiac enlargement and coronary artery calcifications.  2. Patchy ground-glass opacity in both lungs with vague nodularity.  Findings suggest a partial airspace filling process such as edema,  hemorrhage or inflammatory or infectious alveolitis. No focal  airspace consolidation.  3. Possible new cystic lesion in the pancreatic body/tail junction  region. Please see above discussion.  Electronically Signed  By: Kalman Jewels M.D.  On: 04/14/2013 10:56  05/27/13- 75 yoF former smoker followed for bilateral pulmonary infiltrates/ nodules, incr C-ANCA, s/p VATS bx/ Granulomatous Polyangiitis (Wegener's) vs cryptogenic organizing pneumonia/ BOOP, Metastatic intestinal carcinoid. Complicated by Hx breast Ca 2006/ Dr Truddie Coco. AFib/ Dr Martinique, DM. FOLLOWS FOR:  Breathing doing well-- no concerns today Minimal dry cough. Not needing Dulera or O2. Neb BID helps. CXR 05/09/13 1V FINDINGS:  Marked cardiopericardial enlargement. Unchanged tortuosity of the  aorta. The lungs are hyperinflated chronically. There is pulmonary  venous congestion without edema or effusion. No pneumothorax. No  asymmetric opacity.  IMPRESSION:  Pulmonary venous  congestion without edema or effusion.  Electronically Signed  By: Jorje Guild M.D.  On: 05/09/2013 05:54 ECHO 04/14/13 Study Conclusions - Left ventricle: The cavity size was normal. Wall thickness was increased in a pattern of mild LVH. Systolic function was normal. The estimated ejection fraction was in the range of 60% to 65%. - Mitral valve: Mild to moderate regurgitation. - Left atrium: The atrium was moderately to severely dilated. - Right ventricle: The cavity size was mildly to moderately dilated. Wall thickness was normal. Systolic function was mildly reduced. - Right atrium: The atrium was severely dilated. - Tricuspid valve: Moderate regurgitation. - Pulmonary arteries: PA peak pressure: 3m Hg (S). Transthoracic echocardiography. M-mode, complete 2D, spectral Doppler, and color Doppler. Height: Height: 166.4cm. Height: 65.5in. Weight: Weight: 94.3kg. Weight: 207.5lb. Body mass index: BMI: 34.1kg/m^2. Body surface area: BSA: 2.137m. Blood pressure: 145/72. Patient status: Inpatient. Location: Bedside.  07/19/13- [age]oF former smoker followed for bilateral pulmonary infiltrates/ nodules, incr C-ANCA, s/p VATS bx/ Granulomatous Polyangiitis (Wegener's) vs cryptogenic organizing pneumonia/ BOOP, Metastatic intestinal carcinoid. Complicated by Hx breast Ca 2006/ Dr RuTruddie CocoAFib/ Dr JoMartiniqueDM. FOLLOWS FOR: had to use nebulizer treatment due to sitting outside with increased pollen; feels better now and no new complaints Pollen caused cough and watery nose. Nebulizer helps with occasional use. Continues maintenance prednisone 1/2 x 20 mg daily.  [facility]  [facility] for .a post [facility] follow up .  She was admitted with community-acquired pneumonia versus acute bronchitis. Chest x-ray showed asymmetrical airspace disease superimposed on chronic lung changes. She was treated with aggressive IV antibiotics, and discharged on a course  of Levaquin .she was  discharged on oxygen therapy. Says still weak from hospital stay.  Has ov with PCP today for labs.  Finished abx earlier today. She denies any hemoptysis, orthopnea, PND, or leg Swelling .   09/07/13- 81 yoF former smoker followed for bilateral pulmonary infiltrates/ nodules, incr C-ANCA, s/p VATS bx/ Granulomatous Polyangiitis (Wegener's) vs cryptogenic organizing pneumonia/ BOOP, Metastatic intestinal carcinoid. Complicated by Hx breast Ca 2006/ Dr Truddie Coco. AFib/ PHTN Dr Martinique, DM FOLLOWS FOR: pt would like to come off O2-was d/c'd home with it and does not like it. Pt states she feels much better from 2 weeks ago. CXR 09/07/13 FINDINGS:  Underlying chronic interstitial disease is stable. There is no frank  edema or airspace consolidation currently. There has been apparent  interval clearing of subtle opacities on the right, which were felt  to represent infiltrate superimposed on chronic disease.  Heart is enlarged with pulmonary vascularity within normal limits.  No adenopathy. There is evidence of an old fracture of the left  posterior sixth rib, stable.  IMPRESSION:  Stable chronic interstitial disease and cardiomegaly. No  superimposed edema or consolidation is appreciable currently. No new  opacities appreciable compared to most recent prior study.  Electronically Signed  By: Lowella Grip M.D.  On: 09/07/2013 09:24  01/09/14- 58 yoF former smoker followed for bilateral pulmonary infiltrates/ nodules, incr C-ANCA, s/p VATS bx/ Granulomatous Polyangiitis (Wegener's) vs cryptogenic organizing pneumonia/ BOOP, Metastatic intestinal carcinoid. Complicated by Hx breast Ca 2006. AFib/ PHTN Dr Martinique, DM FOLLOWS FOR: Pt states her breathing is doing good; wonders if she can come off her O2 QHS; has not had to use her neb tx since last visit. Has not been needing her nebulizer or inhalers at all. Daily prednisone 5-10 mg She asks me about an incisional hernia near her umbilicus. Not painful.  She would like to talk to a surgeon about it.  We talked about respiratory limits for surgery and anesthesia.  07/11/14- 81 yoF former smoker followed for bilateral pulmonary infiltrates/ nodules, incr C-ANCA, s/p VATS bx/ Granulomatous Polyangiitis (Wegener's) vs cryptogenic organizing pneumonia/ BOOP, Metastatic intestinal carcinoid. Complicated by Hx breast Ca 2006. AFib/ PHTN Dr Martinique, DM     husband here FOLLOWS FOR: Pt states she is still using O2 2 sleep/ Advanced @ night. Pt states breathing has improved. Denies any SOB, cough, wheezing, and chest tightness/congestion. She would like not to need oxygen but is managing to do as instructed.- 2l w/ sleep CXR 04/04/24 IMPRESSION: Cardiomegaly, chronic. Bronchitis. No consolidation or collapse. Electronically Signed  By: Nelson Chimes M.D.  On: 04/04/2014 10:13  ROS-see HPI Constitutional:   No-   weight loss, night sweats, fevers, chills, +fatigue, lassitude. HEENT:   No-  headaches, difficulty swallowing, tooth/dental problems, sore throat,       No-  sneezing, itching, ear ache, nasal congestion, post nasal drip,  CV: No -chest pain, orthopnea, PND, swelling in lower extremities, anasarca,    No-palpitations Resp:   shortness of breath with exertion or at rest.           No- productive cough,  No-non-productive cough,               No-   change in color of mucus.  No- wheezing.   Skin: No-   rash or lesions. no- pruritus GI:  No-   heartburn, indigestion, abdominal pain, nausea, vomiting,  GU: MS:  No-   joint pain or swelling.  Neuro-     +Vertigo-  uses wheelchair Psych:  No- change in mood or affect. No depression or anxiety.  No memory loss.  OBJ General- Alert, Oriented, Affect-appropriate, Distress- none acute, overweight, +wheelchair    room air saturation 97% Skin- rash-none, lesions- none, excoriation- none Lymphadenopathy- none Head- atraumatic            Eyes- Gross vision intact, PERRLA, conjunctivae -pale             Ears- Hearing, canals-normal            Nose- turbinate edema, no-Septal dev, mucus, polyps, erosion, perforation             Throat- Mallampati II-III , mucosa clear , drainage- none, tonsils- atrophic Neck- flexible , trachea midline, no stridor , thyroid nl, carotid no bruit Chest - symmetrical excursion , unlabored           Heart/CV-+ slow irregularly irregular/ AFib , 2-3/6 precordial systolic murmur , no gallop  , no rub, Nl s1 s2                 - JVD+full , edema-none, stasis changes- none, varices- none           Lung- clear, no wheeze , cough+raspy , dullness-none, rub- none           Chest wall- healed VATS thoracotomy incision L Abd-  +R para-umbilical hernia, Br/ Gen/ Rectal- Not done, not indicated Extrem- cyanosis- none, clubbing, none, atrophy- none, strength- nl. Wheelchair for distance/ cane at home.  Neuro- grossly intact to observation

## 2014-07-11 NOTE — Assessment & Plan Note (Signed)
Clinically much improved. If she remains stable consider repeat PFT

## 2014-07-19 ENCOUNTER — Ambulatory Visit (INDEPENDENT_AMBULATORY_CARE_PROVIDER_SITE_OTHER): Payer: Medicare Other | Admitting: Family Medicine

## 2014-07-19 ENCOUNTER — Encounter: Payer: Self-pay | Admitting: Family Medicine

## 2014-07-19 VITALS — BP 130/70 | HR 56 | Temp 98.0°F | Wt 194.0 lb

## 2014-07-19 DIAGNOSIS — I10 Essential (primary) hypertension: Secondary | ICD-10-CM | POA: Diagnosis not present

## 2014-07-19 DIAGNOSIS — E1149 Type 2 diabetes mellitus with other diabetic neurological complication: Secondary | ICD-10-CM | POA: Insufficient documentation

## 2014-07-19 DIAGNOSIS — E785 Hyperlipidemia, unspecified: Secondary | ICD-10-CM | POA: Diagnosis not present

## 2014-07-19 DIAGNOSIS — E119 Type 2 diabetes mellitus without complications: Secondary | ICD-10-CM | POA: Diagnosis not present

## 2014-07-19 LAB — HEMOGLOBIN A1C: Hgb A1c MFr Bld: 6.8 % — ABNORMAL HIGH (ref 4.6–6.5)

## 2014-07-19 NOTE — Progress Notes (Signed)
Subjective:    Patient ID: [name] [name], female    DOB: 1933-03-02, 81 y.o.   MRN: [id number]  HPI Patient here for follow-up regarding type 2 diabetes. Last A1c 6.8%. Her fasting blood sugars been relatively stable. Recent poor compliance with diet. She remains on prednisone alternating 10 mg and 15 mg per pulmonary. Overall stable. Her main challenge has been helping take care of her husband who has advancing dementia.  Hypertension stable. No recent dizziness. Denies any chest pains. No fevers or chills. Hyperlipidemia treated with Crestor. Her lipids were at goal when checked last fall. Compliant with all medications.  Past Medical History  Diagnosis Date  . HYPOTHYROIDISM 07/24/2008  . HYPERLIPIDEMIA 07/24/2008  . DEPRESSION 05/16/2009  . HYPERTENSION 07/24/2008  . Vertigo   . Diabetes mellitus type II   . Arthritis     r tkr  . CVA (cerebral vascular accident) 2008    mini stroke.no residual  . Heart murmur     stress test 2009.dr peter Martinique  . Intermittent vertigo   . Skin abnormality     facial lesions .pt applying mupiracin to areas  . Atrial fibrillation   . Breast cancer   . Pneumonia   . 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 Knee surgery  2009    TKR  . Cholecystectomy  1980  . Joint replacement      r knee  . Breast surgery  2007    Lumpectomy, XRT 2006.l breast  . Video assisted thoracoscopy  04/22/2011    Procedure: VIDEO ASSISTED THORACOSCOPY;  Surgeon: Melrose Nakayama, MD;  Location: [facility];  Service: Thoracic;  Laterality: Left;  WITH BIOPSY  . Heel spur surgery Right   . Exploratory laparotomy      reports that she quit smoking about 32 years ago. Her smoking use included Cigarettes. She has a 6 pack-year smoking history. She has never used smokeless tobacco. She reports that she does not drink alcohol or use illicit drugs. family history includes Arthritis in her mother; Breast cancer in her  sister; Cancer in her sister; Coronary artery disease in her brother and father; Heart disease in her father; Lung cancer in her brother; Rheum arthritis in her mother. Allergies  Allergen Reactions  . Codeine Sulfate Other (See Comments)    REACTION: GI upset  . Sulfonamide Derivatives Other (See Comments)    REACTION: GI upset  . Vancomycin Other (See Comments)    Red man syndrome  . Atarax [Hydroxyzine] Nausea Only and Rash      Review of Systems  Constitutional: Positive for fatigue.  Eyes: Negative for visual disturbance.  Respiratory: Negative for cough, chest tightness, shortness of breath and wheezing.   Cardiovascular: Negative for chest pain, palpitations and leg swelling.  Neurological: Negative for dizziness, seizures, syncope, weakness, light-headedness and headaches.       Objective:   Physical Exam  Constitutional: She is oriented to person, place, and time. She appears well-developed and well-nourished. No distress.  Cardiovascular: Normal rate and regular rhythm.   Pulmonary/Chest: Effort normal and breath sounds normal. No respiratory distress. She has no wheezes. She has no rales.  Musculoskeletal: She exhibits no edema.  Neurological: She is alert and oriented to person, place, and time.          Assessment & Plan:  #1 type 2 diabetes. History of good control. Recheck A1c. #2 hypertension stable and at goal #3 hyperlipidemia on  Crestor. Recheck lipids at follow-up in 6 months

## 2014-07-19 NOTE — Progress Notes (Signed)
Pre visit review using our clinic review tool, if applicable. No additional management support is needed unless otherwise documented below in the visit note.

## 2014-07-20 ENCOUNTER — Other Ambulatory Visit: Payer: Self-pay | Admitting: Family Medicine

## 2014-08-02 ENCOUNTER — Other Ambulatory Visit: Payer: Self-pay | Admitting: Family Medicine

## 2014-08-04 ENCOUNTER — Ambulatory Visit (INDEPENDENT_AMBULATORY_CARE_PROVIDER_SITE_OTHER): Payer: Medicare Other | Admitting: Family Medicine

## 2014-08-04 VITALS — BP 120/72 | HR 60 | Temp 98.3°F | Wt 202.0 lb

## 2014-08-04 DIAGNOSIS — R110 Nausea: Secondary | ICD-10-CM

## 2014-08-04 DIAGNOSIS — R600 Localized edema: Secondary | ICD-10-CM | POA: Diagnosis not present

## 2014-08-04 NOTE — Progress Notes (Signed)
Pre visit review using our clinic review tool, if applicable. No additional management support is needed unless otherwise documented below in the visit note.

## 2014-08-04 NOTE — Patient Instructions (Signed)
Get back on Claritan for allergy symptoms Double up lasix to 40 mg daily for the next 4 days and then drop back to 20 mg daily Consider daily weights at home same time of day- if weight increases by more than 2 pounds in one day or 5 pounds in one week increase Lasix back to 40 mg daily for 3 days.

## 2014-08-04 NOTE — Progress Notes (Signed)
Subjective:    Patient ID: [name] [name], female    DOB: [birth year], 81 y.o.   MRN: [id number]  HPI Seen with complaints of bilateral leg edema past 4 days. She denies any dietary changes. No medical changes. She takes Lasix 20 mg once daily. She's not had any orthopnea or any increased dyspnea with exertion. She is very sedentary. Her weight is up 8 pounds from last visit. She generally is very cautious about sodium intake. She has not recently done daily weights.  She complains of some nausea without vomiting past few days. She's had some fairly heavy postnasal drainage. No purulent secretions. She has allergy tendencies and has not taken any antihistamines recently.  Past Medical History  Diagnosis Date  . HYPOTHYROIDISM 07/24/2008  . HYPERLIPIDEMIA 07/24/2008  . DEPRESSION 05/16/2009  . HYPERTENSION 07/24/2008  . Vertigo   . Diabetes mellitus type II   . Arthritis     r tkr  . CVA (cerebral vascular accident) 2008    mini stroke.no residual  . Heart murmur     stress test 2009.dr peter Martinique  . Intermittent vertigo   . Skin abnormality     facial lesions .pt applying mupiracin to areas  . Atrial fibrillation   . Breast cancer   . Pneumonia   . 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 Knee surgery  2009    TKR  . Cholecystectomy  1980  . Joint replacement      r knee  . Breast surgery  2007    Lumpectomy, XRT 2006.l breast  . Video assisted thoracoscopy  04/22/2011    Procedure: VIDEO ASSISTED THORACOSCOPY;  Surgeon: Melrose Nakayama, MD;  Location: [facility];  Service: Thoracic;  Laterality: Left;  WITH BIOPSY  . Heel spur surgery Right   . Exploratory laparotomy      reports that she quit smoking about 32 years ago. Her smoking use included Cigarettes. She has a 6 pack-year smoking history. She has never used smokeless tobacco. She reports that she does not drink alcohol or use illicit drugs. family history includes  Arthritis in her mother; Breast cancer in her sister; Cancer in her sister; Coronary artery disease in her brother and father; Heart disease in her father; Lung cancer in her brother; Rheum arthritis in her mother. Allergies  Allergen Reactions  . Codeine Sulfate Other (See Comments)    REACTION: GI upset  . Sulfonamide Derivatives Other (See Comments)    REACTION: GI upset  . Vancomycin Other (See Comments)    Red man syndrome  . Atarax [Hydroxyzine] Nausea Only and Rash  '    Review of Systems  Constitutional: Negative for fever and chills.  Respiratory: Negative for cough, shortness of breath and wheezing.   Cardiovascular: Positive for leg swelling. Negative for chest pain.  Gastrointestinal: Positive for nausea. Negative for vomiting, abdominal pain and diarrhea.  Genitourinary: Negative for dysuria.       Objective:   Physical Exam  Constitutional: She appears well-developed and well-nourished. No distress.  Neck: Neck supple. No thyromegaly present.  Question mild right JVD  Cardiovascular: Normal rate and regular rhythm.   Pulmonary/Chest: Effort normal and breath sounds normal. No respiratory distress. She has no wheezes. She has no rales.  Musculoskeletal: She exhibits edema.  1+ pitting edema feet ankles lower legs bilaterally.          Assessment & Plan:  #1 bilateral leg edema. Weight is  also up as above. Double up her Lasix to 40 mg daily for 4 days then drop back to 20 mg. We recommended daily weights. #2 nausea probably related to postnasal drip symptoms. Get back on Claritin 10 mg daily. Touch base if symptoms persist after treatment of PND symptoms.

## 2014-08-13 ENCOUNTER — Other Ambulatory Visit: Payer: Self-pay | Admitting: Family Medicine

## 2014-08-19 ENCOUNTER — Other Ambulatory Visit: Payer: Self-pay | Admitting: Family Medicine

## 2014-08-23 ENCOUNTER — Other Ambulatory Visit: Payer: Self-pay | Admitting: Family Medicine

## 2014-08-23 NOTE — Telephone Encounter (Signed)
This was prescribed per pulmonary and would like her to get through them, if possible, since they are managing her prednisone.

## 2014-08-24 MED ORDER — PREDNISONE 5 MG PO TABS: ORAL_TABLET | ORAL | Status: DC

## 2014-08-24 NOTE — Telephone Encounter (Signed)
Order- change prednisone 5 mg # 50 to alternate 1(5 mg) with  2(10 mg) every other day, ref x 5

## 2014-08-25 ENCOUNTER — Other Ambulatory Visit: Payer: Self-pay | Admitting: Family Medicine

## 2014-08-25 ENCOUNTER — Telehealth: Payer: Self-pay

## 2014-08-25 ENCOUNTER — Telehealth: Payer: Self-pay | Admitting: Family Medicine

## 2014-08-25 NOTE — Telephone Encounter (Signed)
No she does not need to hold Eliquis for these dental procedures.  Peter Martinique MD, [facility]

## 2014-08-25 NOTE — Telephone Encounter (Signed)
Asked DO Kim about patient question due to MD Burchette being out of office. DO Kim advised patient to call cardiologist on how to advise Eliquis. Called patient and made aware. Patient verbalized understanding and will call cardiology.

## 2014-08-25 NOTE — Telephone Encounter (Signed)
Returned call to patient Dr.Jordan advised does not need to hold eliquis before these dental procedures.

## 2014-08-25 NOTE — Telephone Encounter (Signed)
Pt is having dental work done next wed and wants to know if she should stop taking her apixaban (ELIQUIS) 5 MG TABS tablet ___days before or at all?

## 2014-08-25 NOTE — Telephone Encounter (Signed)
Received a call from patient she wants to know if she needs to hold Eliquis before she has dental cleaning and a filling Wed 08/30/14.Message sent to [place] for advice.

## 2014-09-18 ENCOUNTER — Other Ambulatory Visit: Payer: Self-pay | Admitting: Family Medicine

## 2014-09-19 ENCOUNTER — Ambulatory Visit (INDEPENDENT_AMBULATORY_CARE_PROVIDER_SITE_OTHER): Payer: Medicare Other | Admitting: Internal Medicine

## 2014-09-19 ENCOUNTER — Encounter: Payer: Self-pay | Admitting: Internal Medicine

## 2014-09-19 VITALS — BP 138/72 | HR 73 | Ht 66.0 in | Wt 201.0 lb

## 2014-09-19 DIAGNOSIS — J9621 Acute and chronic respiratory failure with hypoxia: Secondary | ICD-10-CM

## 2014-09-19 DIAGNOSIS — M3130 Wegener's granulomatosis without renal involvement: Secondary | ICD-10-CM

## 2014-09-19 NOTE — Patient Instructions (Signed)
Ok to continue prednisone alternating 10 mg with 5 mg every other day  We are contacting [facility] for the result of your overnight oxygen study.  Please call as needed

## 2014-09-19 NOTE — Progress Notes (Signed)
04/03/11- 81 year old female former smoking history referred courtesy of Dr. Elease Hashimoto with concern of abnormal chest CT. Husband is an allergy patient here. Dr. Erick Blinks recent office note reviewed "Followup from recent pneumonia. Patient had presented here with extreme myalgias and chronic pain along with bilateral shoulder hip pain. Very high sedimentation rate of 120.  She almost total resolution of myalgias with low dose prednisone strongly suggesting polymyalgia rheumatica. She subsequently presented with cough with trace of blood but no dyspnea, fever, reported a pain. Chest x-ray showed bilateral infiltrates suggesting probable bilateral pneumonia the recommendation for CT scan. CT scan revealed no adenopathy and no evidence for a lung mass. Compatible with bilateral pneumonia. Patient placed on antibiotic and much better at this time with no fever and essentially no cough. Her muscle and joint pains are almost 100% resolved with low-dose prednisone. She did present back in September with severe sinus disease suggesting acute sinusitis. CT revealed no acute findings. She denies any arthralgias at this time. No skin rash. No fever." She has been on prednisone 10 mg twice daily since December 13 and feels significantly better. Morning cough has cleared with residual trace phlegm and trace blood. No chest pain. CT chest 03/23/2011 -images reviewed by me-bilateral patchy airspace disease with air bronchograms. No cavitation or definite nodularity seen. She has had right total knee replacement but no generalized arthritis. Childhood exposure to an uncle with tuberculosis. Her PPD skin test was negative. She denies history of lung disease, GERD, DVT, kidney or liver disease. Lab- C-ANCA POS 1:320, P-ANCA NEG. RA 1:23, ANA NEG11 year old female former smoking history referred courtesy of Dr. Elease Hashimoto with concern of abnormal chest CT. Husband is an allergy patient here.  05/13/11- 52 yoF former smoker  followed for bilateral pulmonary infiltrates, incr C-ANCA, s/p VATS bx/ organizing pneumonia, Metastatic intestinal carcinoid. Marland Kitchen Hx breast Ca 2006/ Dr Truddie Coco Husband here. She returns now after left lung biopsy. She has expected left thoracotomy pain but is otherwise stable as she continues maintenance prednisone. Denies blood, fever, swollen glands, discolored sputum. Exertional dyspnea is not worse, allowing for the surgery. I shared with her and her husband the available initial pathology: Patchy organizing pneumonia with chronic interstitial pneumonia and focal atypical glands. I reviewed the discussion about her at thoracic oncology conference. The parenchymal lung process is not a vasculitis as I originally questioned. I told her there was an incidental finding of cancer cells with further identification pending but that this was a separate process. We agreed to refer her to Dr. Truddie Coco who managed her breast cancer. After she had left, personal communication from Dr. Jimmy Footman- neuroendocrine tumor staining consistent with metastatic intestinal carcinoid.  06/20/11  11 yoF former smoker followed for bilateral pulmonary infiltrates, incr C-ANCA, s/p VATS bx/ organizing pneumonia, Metastatic intestinal carcinoid. Marland Kitchen Hx breast Ca 2006/ Dr Truddie Coco   Husband here She says her breathing is now "fine" back to her normal. She denies fever, cough, sputum. I spoke with Mrs. Roehrs myself and had to explain the discovery of carcinoid after her lung biopsy. We also have the note from the [facility] indicating they discussed it with her. She has absolutely no recollection. Little cough or shortness of breath now. Still a little posterior thoracotomy incision pain. CT 05/19/11- images reviewed with them- IMPRESSION:  1. Marked improvement in bilateral air space disease seen on  comparison CT of 03/24/2011. Near complete resolution.  2. Postoperative change in the left hemithorax consistent with  recent VATS.   Original Report  Authenticated By: Suzy Bouchard, M.D.   11/07/11- 57 yoF former smoker followed for bilateral pulmonary infiltrates, incr C-ANCA, s/p VATS bx/ organizing pneumonia, Metastatic intestinal carcinoid. Marland Kitchen Hx breast Ca 2006/ Dr Truddie Coco    c/o  Chronic vertigo, difficulty breathing and gets tired easy. Also some wheezing, post nasal,  and cough.  Some days she feels much better than others, no real pattern. Occasional light cough to clear throat. We had called in prednisone, which did help. Ambulatory around home, but gets out little. CT chest 09/09/11- I reviewed w/ her- there is significant patchy bilateral lower zone fibrotic scarring and some bronchial thickening. IMPRESSION:  1. No evidence of thoracic metastatic disease or other active  process.  2. Small mild cardiomegaly and hiatal hernia.  Original Report Authenticated By: Marlaine Hind, M.D.    02/09/12-  27 yoF former smoker followed for bilateral pulmonary infiltrates, incr C-ANCA, s/p VATS bx/ cryptogenic organizing pneumonia/ BOOP, Metastatic intestinal carcinoid. Marland Kitchen Hx breast Ca 2006/ Dr Truddie Coco  January had biopsy to ck for other lung condition. SOB and wheezing at times. Left sided pain around the  Back. Continues Advair 250. Due for followup chest CT in December for Dr. Jacquiline Doe. Not on prednisone. She says breathing is unchanged. Denies pain, blood or swollen nodes.   05/11/12-  89 yoF former smoker followed for bilateral pulmonary infiltrates, incr C-ANCA, s/p VATS bx/ cryptogenic organizing pneumonia/ BOOP, Metastatic intestinal carcinoid. Complicated by Hx breast Ca 2006/ Dr Truddie Coco. AFib/ Dr Martinique, DM. She says her husband is developing dementia. Deep breath makes her left flank feel tight where she had thoracentesis. Complains of itching since starting Eliquis.Atarax 50 mg was too strong and Benadryl did not help. Itching keeps her awake all night. No visible rash. She is convinced really unpleasant itching began when  she started Eliquis. I explained this medication needs to be transitioned carefully to anything else and Dr. Martinique would need to be the one to manage that. CT chest 03/26/12 . IMPRESSION:  1. No findings to strongly suggest metastatic disease to the  thorax.  2. There is a new focus of nodular ground-glass attenuation  measuring 1 cm in the anterior aspect of the left upper lobe which  is highly nonspecific. Initial follow-up by chest CT without  contrast is recommended in 3 months to confirm persistence. This  recommendation follows the consensus statement: Recommendations for  the Management of Subsolid Pulmonary Nodules Detected at CT: A  Statement from the [employer] Society as published in Radiology  2013; 266:304-317.  3. Atherosclerosis, including left main and three-vessel coronary  artery disease. Assessment for potential risk factor  modification, dietary therapy or pharmacologic therapy may be  warranted, if clinically indicated.  4. Slight increase in the small amount of pericardial fluid  compared to the prior examination. This is unlikely to be of  hemodynamic significance at this time.  5. Moderate cardiomegaly.  6. Additional incidental findings, similar to prior studies, as  discussed above.  Original Report Authenticated By: Vinnie Langton, M.D.   08/08/12- 15 yoF former smoker followed for bilateral pulmonary infiltrates/ nodules, incr C-ANCA, s/p VATS bx/ cryptogenic organizing pneumonia/ BOOP, Metastatic intestinal carcinoid. Complicated by Hx breast Ca 2006/ Dr Truddie Coco. AFib/ Dr Martinique, DM. Itching stopped, while continuing Eliquis. She had changed detergents and fabric softener, but cause unknown. Seasonal rhinitis symptoms now. Total IgE 05/11/2012 was less than 1.5. New burning itch, intermittent, right lateral ribs x2 weeks. Has had shingles shot. No visible rash. CT chest  08/02/12 IMPRESSION:  Interval persistence but decrease in size of the ground-glass   nodules seen in the anterior left upper lobe. Adenocarcinoma  cannot be excluded. Followup by CT is recommended in 12 months,  with continued annual surveillance for a minimum of 3 years.  These recommendations are taken from "Recommendations for the  Management of Subsolid Pulmonary Nodules Detected at CT: A  Statement from the [employer]" Radiology 2013; 266:1, 304-  317.  Original Report Authenticated By: Misty Stanley, M.D.   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 Relapse since 2013 11/02/12 PR-3 880. MPO negative. - GBM neg Likely - capillaritis. 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 Admitted with hemoptysis  hemoptysis with CT scan chest findings of alveolar hemorrhage  As of June 2014 the oncologist did not feel there was any evidence of breast cancer recurrence.CT scan of the chest ruled out pulmonary embolism but showed severe bilateral groundglass opacities consistent with alveolar hemorrhage pattern. Pulmonary consultation was called. She was initially treated by stopping eliquis, providing empiric antibiotics and supported with diuresis.  Pulmonary  felt The clinical picture was felt to be consistent with diffuse alveolar hemorrhage resulting in hemoptysis. Most likely cause of alveolar hemorrhage was felt to be autoimmune based on past history of autoimmune antibody positivity and lung biopsy showing organizing pneumonia in December 2012/January 2013. Prior C.-ANCA positive though no evidence of vasculitis in Jan 2013 lung bx which is puzzling. Alternative is relapsing BOOP but the hemoptysis would be unsual unless precipitated by Eliquis. On 11/02/12 PR-3 880. Nephrology consult  On  7/29 immunosuppression therapy was initiated. She was given Pulse methylprednisolone, for three days, and her first dose of Rituxan was given on 7/30. The following days we saw significant clinical improvement in regards to hemoptysis, pulmonary infiltrates and dyspnea, She was started on  Rituxan over cyclophosphamide due to malignancy history, and also this is technically a "relapse" or A "flare". Dose starting 11/03/12 will be Induction therapy with rituximab (375 mg/m2 per week for four weeks). W/  PCP prophylaxis w/ - Bactrim 1 DS on M, W, F  Since discharge she is feeling better with resolution of hemoptysis, . Dyspnea is less and she is starting to get some of her energy back.  No fever , orthopnea or edema.  Had labs done last week. , hbg stable at 8.9 , renal fx tr down at 1.6 (max scr 2.4) . Ov with oncology last week. Rituxan  infusion given.   12/03/12- 85 yoF former smoker followed for bilateral pulmonary infiltrates/ nodules, incr C-ANCA, s/p VATS bx/ Granulomatous Polyangiitis (Wegener's) vs cryptogenic organizing pneumonia/ BOOP, Metastatic intestinal carcinoid. Complicated by Hx breast Ca 2006/ Dr Truddie Coco. AFib/ Dr Martinique, DM. Started Rituxan 375 mg/m2x 4 weeks 11/03/12. PCP prophy Bactrim 1DS on M,W,F. FOLLOWS FOR: reports doing well since ov w/ TP.  still doing well on Dulera, but needs a refill.  still taking prednisone 9m daily. 1 dose of Rituxan still pending. Had to have transfusion last week for hemoglobin 7.7. No hemoptysis since a little bit at first recognition of her granulomatous disease. She has continued prednisone 60 mg daily since hospital discharge, and we have discussed tapering the dose. No coughing, no chest pain or fever.  01/17/13- 57 yoF former smoker followed for bilateral pulmonary infiltrates/ nodules, incr C-ANCA, s/p VATS bx/ Granulomatous Polyangiitis (Wegener's) vs cryptogenic organizing pneumonia/ BOOP, Metastatic intestinal carcinoid. Complicated by Hx breast  Ca 2006/ Dr Truddie Coco. AFib/ Dr Martinique, DM. FOLLOWS FOR:  Breathing has improved since last OV needs refill on dulera Had flu vaccine [place]aces herself. Feels she is getting stronger. Has not needed Dulera in 2 or 3 weeks.  04/22/14- 50 yoF former smoker followed for bilateral pulmonary infiltrates/ nodules, incr C-ANCA, s/p VATS bx/ Granulomatous Polyangiitis (Wegener's) vs cryptogenic organizing pneumonia/ BOOP, Metastatic intestinal carcinoid. Complicated by Hx breast Ca 2006/ Dr Truddie Coco. AFib/ Dr Martinique, DM. FOLLOWS FOR: recently in hospital; bronchitis Hospital 1/7-1/12/15- acute bronchitis/respiratory failure/probably viral. There may have been fluid overload, she feels better now on daily Lasix. Prednisone now at 1/2x20 mg daily. CT chest 04/14/13 IMPRESSION:  1. Stable cardiac enlargement and coronary artery calcifications.  2. Patchy ground-glass opacity in both lungs with vague nodularity.  Findings suggest a partial airspace filling process such as edema,  hemorrhage or inflammatory or infectious alveolitis. No focal  airspace consolidation.  3. Possible new cystic lesion in the pancreatic body/tail junction  region. Please see above discussion.  Electronically Signed  By: Kalman Jewels M.D.  On: 04/14/2013 10:56  05/27/13- 75 yoF former smoker followed for bilateral pulmonary infiltrates/ nodules, incr C-ANCA, s/p VATS bx/ Granulomatous Polyangiitis (Wegener's) vs cryptogenic organizing pneumonia/ BOOP, Metastatic intestinal carcinoid. Complicated by Hx breast Ca 2006/ Dr Truddie Coco. AFib/ Dr Martinique, DM. FOLLOWS FOR:  Breathing doing well-- no concerns today Minimal dry cough. Not needing Dulera or O2. Neb BID helps. CXR 05/09/13 1V FINDINGS:  Marked cardiopericardial enlargement. Unchanged tortuosity of the  aorta. The lungs are hyperinflated chronically. There is pulmonary  venous congestion without edema or effusion. No pneumothorax. No  asymmetric opacity.  IMPRESSION:  Pulmonary venous  congestion without edema or effusion.  Electronically Signed  By: Jorje Guild M.D.  On: 05/09/2013 05:54 ECHO 04/14/13 Study Conclusions - Left ventricle: The cavity size was normal. Wall thickness was increased in a pattern of mild LVH. Systolic function was normal. The estimated ejection fraction was in the range of 60% to 65%. - Mitral valve: Mild to moderate regurgitation. - Left atrium: The atrium was moderately to severely dilated. - Right ventricle: The cavity size was mildly to moderately dilated. Wall thickness was normal. Systolic function was mildly reduced. - Right atrium: The atrium was severely dilated. - Tricuspid valve: Moderate regurgitation. - Pulmonary arteries: PA peak pressure: 3m Hg (S). Transthoracic echocardiography. M-mode, complete 2D, spectral Doppler, and color Doppler. Height: Height: 166.4cm. Height: 65.5in. Weight: Weight: 94.3kg. Weight: 207.5lb. Body mass index: BMI: 34.1kg/m^2. Body surface area: BSA: 2.137m. Blood pressure: 145/72. Patient status: Inpatient. Location: Bedside.  07/19/13- [age]oF former smoker followed for bilateral pulmonary infiltrates/ nodules, incr C-ANCA, s/p VATS bx/ Granulomatous Polyangiitis (Wegener's) vs cryptogenic organizing pneumonia/ BOOP, Metastatic intestinal carcinoid. Complicated by Hx breast Ca 2006/ Dr RuTruddie CocoAFib/ Dr JoMartiniqueDM. FOLLOWS FOR: had to use nebulizer treatment due to sitting outside with increased pollen; feels better now and no new complaints Pollen caused cough and watery nose. Nebulizer helps with occasional use. Continues maintenance prednisone 1/2 x 20 mg daily.  [facility]  [facility] for .a post [facility] follow up .  She was admitted with community-acquired pneumonia versus acute bronchitis. Chest x-ray showed asymmetrical airspace disease superimposed on chronic lung changes. She was treated with aggressive IV antibiotics, and discharged on a course  of Levaquin .she was  discharged on oxygen therapy. Says still weak from hospital stay.  Has ov with PCP today for labs.  Finished abx earlier today. She denies any hemoptysis, orthopnea, PND, or leg Swelling .   09/07/13- 81 yoF former smoker followed for bilateral pulmonary infiltrates/ nodules, incr C-ANCA, s/p VATS bx/ Granulomatous Polyangiitis (Wegener's) vs cryptogenic organizing pneumonia/ BOOP, Metastatic intestinal carcinoid. Complicated by Hx breast Ca 2006/ Dr Truddie Coco. AFib/ PHTN Dr Martinique, DM FOLLOWS FOR: pt would like to come off O2-was d/c'd home with it and does not like it. Pt states she feels much better from 2 weeks ago. CXR 09/07/13 FINDINGS:  Underlying chronic interstitial disease is stable. There is no frank  edema or airspace consolidation currently. There has been apparent  interval clearing of subtle opacities on the right, which were felt  to represent infiltrate superimposed on chronic disease.  Heart is enlarged with pulmonary vascularity within normal limits.  No adenopathy. There is evidence of an old fracture of the left  posterior sixth rib, stable.  IMPRESSION:  Stable chronic interstitial disease and cardiomegaly. No  superimposed edema or consolidation is appreciable currently. No new  opacities appreciable compared to most recent prior study.  Electronically Signed  By: Lowella Grip M.D.  On: 09/07/2013 09:24  01/09/14- 34 yoF former smoker followed for bilateral pulmonary infiltrates/ nodules, incr C-ANCA, s/p VATS bx/ Granulomatous Polyangiitis (Wegener's) vs cryptogenic organizing pneumonia/ BOOP, Metastatic intestinal carcinoid. Complicated by Hx breast Ca 2006. AFib/ PHTN Dr Martinique, DM FOLLOWS FOR: Pt states her breathing is doing good; wonders if she can come off her O2 QHS; has not had to use her neb tx since last visit. Has not been needing her nebulizer or inhalers at all. Daily prednisone 5-10 mg She asks me about an incisional hernia near her umbilicus. Not painful.  She would like to talk to a surgeon about it.  We talked about respiratory limits for surgery and anesthesia.  07/11/14- 81 yoF former smoker followed for bilateral pulmonary infiltrates/ nodules, incr C-ANCA, s/p VATS bx/ Granulomatous Polyangiitis (Wegener's) vs cryptogenic organizing pneumonia/ BOOP, Metastatic intestinal carcinoid. Complicated by Hx breast Ca 2006. AFib/ PHTN Dr Martinique, DM     husband here FOLLOWS FOR: Pt states she is still using O2 2 sleep/ Advanced @ night. Pt states breathing has improved. Denies any SOB, cough, wheezing, and chest tightness/congestion. She would like not to need oxygen but is managing to do as instructed.- 2l w/ sleep CXR 04/04/24 IMPRESSION: Cardiomegaly, chronic. Bronchitis. No consolidation or collapse. Electronically Signed  By: Nelson Chimes M.D.  On: 04/04/2014 10:13  09/19/14- 63 yoF former smoker followed for bilateral pulmonary infiltrates/ nodules, incr C-ANCA, s/p VATS bx/ Granulomatous Polyangiitis (Wegener's) vs cryptogenic organizing pneumonia/ BOOP, Metastatic intestinal carcinoid. Complicated by Hx breast Ca 2006. AFib/ PHTN Dr Martinique, DM      FOLLOWS FOR:Had ONO done in April 2016- never rec'd call about this.Denies sob,wheezing. Occass. cough-dry.c/o had ha's and runny nose-clear. Takes Clartin.No nasal congestion,occass. Pnd. She doesn't feel she needs oxygen anymore and has tried without her 2 L/Advanced for sleep. We are calling to request report. Still are tolerating prednisone 5 mg with 10 mg every other day for Wegener's.  ROS-see HPI Constitutional:   No-   weight loss, night sweats, fevers, chills, +fatigue, lassitude. HEENT:   No-  headaches, difficulty swallowing, tooth/dental problems, sore throat,       No-  sneezing, itching, ear ache, nasal congestion, post nasal drip,  CV: No -  chest pain, orthopnea, PND, swelling in lower extremities, anasarca,    No-palpitations Resp:   shortness of breath with exertion or at rest.            No- productive cough,  No-non-productive cough,               No-   change in color of mucus.  No- wheezing.   Skin: No-   rash or lesions. no- pruritus GI:  No-   heartburn, indigestion, abdominal pain, nausea, vomiting,  GU: MS:  No-   joint pain or swelling.  Neuro-     +Vertigo- uses wheelchair Psych:  No- change in mood or affect. No depression or anxiety.  No memory loss.  OBJ General- Alert, Oriented, Affect-appropriate, Distress- none acute, overweight, +wheelchair     Skin- rash-none, lesions- none, excoriation- none Lymphadenopathy- none Head- atraumatic            Eyes- Gross vision intact, PERRLA, conjunctivae -pale            Ears- Hearing, canals-normal            Nose- turbinate edema, no-Septal dev, mucus, polyps, erosion, perforation             Throat- Mallampati II-III , mucosa clear , drainage- none, tonsils- atrophic Neck- flexible , trachea midline, no stridor , thyroid nl, carotid no bruit Chest - symmetrical excursion , unlabored           Heart/CV-+ slow irregularly irregular/ AFib , 2-3/6 precordial systolic murmur , no gallop  , no rub, Nl s1 s2                 - JVD+full , edema-none, stasis changes- none, varices- none           Lung- + bilateral squeaks, no wheeze , cough+raspy , dullness-none, rub- none           Chest wall- healed VATS thoracotomy incision L Abd-  +R para-umbilical hernia, Br/ Gen/ Rectal- Not done, not indicated Extrem- cyanosis- none, clubbing, none, atrophy- none, strength- nl. Wheelchair for distance/ cane at home.  Neuro- grossly intact to observation

## 2014-09-24 ENCOUNTER — Other Ambulatory Visit: Payer: Self-pay | Admitting: Family Medicine

## 2014-09-24 NOTE — Assessment & Plan Note (Signed)
Her need for sleep oxygen may have resolved with improvement in her heart failure and Wegener's. Plan-verify normal oxygenation during sleep before discontinuing home oxygen. She wants to get rid of it. She understands that we will get it back again in the future if needed.

## 2014-09-24 NOTE — Assessment & Plan Note (Signed)
Symptomatically stable and comfortable alternating prednisone 10 mg with 5 mg every other day

## 2014-09-25 ENCOUNTER — Inpatient Hospital Stay (HOSPITAL_COMMUNITY)
Admission: EM | Admit: 2014-09-25 | Discharge: 2014-09-27 | DRG: 193 | Disposition: A | Discharge status: Home / Self Care | Payer: Medicare Other | Attending: Internal Medicine | Admitting: Internal Medicine

## 2014-09-25 ENCOUNTER — Emergency Department (HOSPITAL_COMMUNITY): Payer: Medicare Other

## 2014-09-25 ENCOUNTER — Ambulatory Visit (INDEPENDENT_AMBULATORY_CARE_PROVIDER_SITE_OTHER): Payer: Medicare Other | Admitting: Family Medicine

## 2014-09-25 ENCOUNTER — Encounter: Payer: Self-pay | Admitting: Family Medicine

## 2014-09-25 ENCOUNTER — Telehealth: Payer: Self-pay

## 2014-09-25 ENCOUNTER — Encounter (HOSPITAL_COMMUNITY): Payer: Self-pay

## 2014-09-25 VITALS — BP 120/80 | HR 70 | Temp 100.9°F | Wt 199.0 lb

## 2014-09-25 DIAGNOSIS — Z7901 Long term (current) use of anticoagulants: Secondary | ICD-10-CM | POA: Diagnosis not present

## 2014-09-25 DIAGNOSIS — E860 Dehydration: Secondary | ICD-10-CM | POA: Diagnosis not present

## 2014-09-25 DIAGNOSIS — N183 Chronic kidney disease, stage 3 unspecified: Secondary | ICD-10-CM | POA: Diagnosis present

## 2014-09-25 DIAGNOSIS — D649 Anemia, unspecified: Secondary | ICD-10-CM | POA: Diagnosis present

## 2014-09-25 DIAGNOSIS — E119 Type 2 diabetes mellitus without complications: Secondary | ICD-10-CM | POA: Diagnosis not present

## 2014-09-25 DIAGNOSIS — I480 Paroxysmal atrial fibrillation: Secondary | ICD-10-CM | POA: Diagnosis present

## 2014-09-25 DIAGNOSIS — E871 Hypo-osmolality and hyponatremia: Secondary | ICD-10-CM | POA: Diagnosis present

## 2014-09-25 DIAGNOSIS — N184 Chronic kidney disease, stage 4 (severe): Secondary | ICD-10-CM | POA: Diagnosis present

## 2014-09-25 DIAGNOSIS — J9601 Acute respiratory failure with hypoxia: Secondary | ICD-10-CM | POA: Diagnosis present

## 2014-09-25 DIAGNOSIS — Z853 Personal history of malignant neoplasm of breast: Secondary | ICD-10-CM

## 2014-09-25 DIAGNOSIS — R609 Edema, unspecified: Secondary | ICD-10-CM | POA: Diagnosis not present

## 2014-09-25 DIAGNOSIS — Z803 Family history of malignant neoplasm of breast: Secondary | ICD-10-CM

## 2014-09-25 DIAGNOSIS — Z993 Dependence on wheelchair: Secondary | ICD-10-CM | POA: Diagnosis not present

## 2014-09-25 DIAGNOSIS — G609 Hereditary and idiopathic neuropathy, unspecified: Secondary | ICD-10-CM | POA: Diagnosis present

## 2014-09-25 DIAGNOSIS — Z9981 Dependence on supplemental oxygen: Secondary | ICD-10-CM

## 2014-09-25 DIAGNOSIS — Z801 Family history of malignant neoplasm of trachea, bronchus and lung: Secondary | ICD-10-CM | POA: Diagnosis not present

## 2014-09-25 DIAGNOSIS — Z8673 Personal history of transient ischemic attack (TIA), and cerebral infarction without residual deficits: Secondary | ICD-10-CM | POA: Diagnosis not present

## 2014-09-25 DIAGNOSIS — E785 Hyperlipidemia, unspecified: Secondary | ICD-10-CM | POA: Diagnosis present

## 2014-09-25 DIAGNOSIS — R509 Fever, unspecified: Secondary | ICD-10-CM

## 2014-09-25 DIAGNOSIS — F419 Anxiety disorder, unspecified: Secondary | ICD-10-CM | POA: Diagnosis present

## 2014-09-25 DIAGNOSIS — Z7952 Long term (current) use of systemic steroids: Secondary | ICD-10-CM | POA: Diagnosis not present

## 2014-09-25 DIAGNOSIS — R531 Weakness: Secondary | ICD-10-CM | POA: Diagnosis not present

## 2014-09-25 DIAGNOSIS — J189 Pneumonia, unspecified organism: Secondary | ICD-10-CM | POA: Diagnosis not present

## 2014-09-25 DIAGNOSIS — Z8249 Family history of ischemic heart disease and other diseases of the circulatory system: Secondary | ICD-10-CM

## 2014-09-25 DIAGNOSIS — M3130 Wegener's granulomatosis without renal involvement: Secondary | ICD-10-CM | POA: Diagnosis present

## 2014-09-25 DIAGNOSIS — I129 Hypertensive chronic kidney disease with stage 1 through stage 4 chronic kidney disease, or unspecified chronic kidney disease: Secondary | ICD-10-CM | POA: Diagnosis present

## 2014-09-25 DIAGNOSIS — R0602 Shortness of breath: Secondary | ICD-10-CM | POA: Diagnosis not present

## 2014-09-25 DIAGNOSIS — E1140 Type 2 diabetes mellitus with diabetic neuropathy, unspecified: Secondary | ICD-10-CM | POA: Diagnosis present

## 2014-09-25 DIAGNOSIS — N390 Urinary tract infection, site not specified: Secondary | ICD-10-CM | POA: Diagnosis present

## 2014-09-25 DIAGNOSIS — Z923 Personal history of irradiation: Secondary | ICD-10-CM | POA: Diagnosis not present

## 2014-09-25 DIAGNOSIS — E039 Hypothyroidism, unspecified: Secondary | ICD-10-CM | POA: Diagnosis present

## 2014-09-25 DIAGNOSIS — N179 Acute kidney failure, unspecified: Secondary | ICD-10-CM

## 2014-09-25 DIAGNOSIS — I5032 Chronic diastolic (congestive) heart failure: Secondary | ICD-10-CM | POA: Diagnosis present

## 2014-09-25 DIAGNOSIS — Z87891 Personal history of nicotine dependence: Secondary | ICD-10-CM | POA: Diagnosis not present

## 2014-09-25 DIAGNOSIS — E1149 Type 2 diabetes mellitus with other diabetic neurological complication: Secondary | ICD-10-CM

## 2014-09-25 LAB — URINALYSIS, ROUTINE W REFLEX MICROSCOPIC
Bilirubin Urine: NEGATIVE
GLUCOSE, UA: NEGATIVE mg/dL
Ketones, ur: NEGATIVE mg/dL
Nitrite: NEGATIVE
PROTEIN: 100 mg/dL — AB
Specific Gravity, Urine: 1.015 (ref 1.005–1.030)
Urobilinogen, UA: 1 mg/dL (ref 0.0–1.0)
pH: 6.5 (ref 5.0–8.0)

## 2014-09-25 LAB — COMPREHENSIVE METABOLIC PANEL
ALK PHOS: 81 U/L (ref 38–126)
ALT: 15 U/L (ref 14–54)
ANION GAP: 9 (ref 5–15)
AST: 23 U/L (ref 15–41)
Albumin: 3.4 g/dL — ABNORMAL LOW (ref 3.5–5.0)
BUN: 30 mg/dL — AB (ref 6–20)
CHLORIDE: 96 mmol/L — AB (ref 101–111)
CO2: 22 mmol/L (ref 22–32)
Calcium: 8.4 mg/dL — ABNORMAL LOW (ref 8.9–10.3)
Creatinine, Ser: 1.94 mg/dL — ABNORMAL HIGH (ref 0.44–1.00)
GFR calc Af Amer: 27 mL/min — ABNORMAL LOW (ref >60)
GFR, EST NON AFRICAN AMERICAN: 23 mL/min — AB (ref >60)
Glucose, Bld: 114 mg/dL — ABNORMAL HIGH (ref 65–99)
POTASSIUM: 4.5 mmol/L (ref 3.5–5.1)
Sodium: 127 mmol/L — ABNORMAL LOW (ref 135–145)
Total Bilirubin: 2 mg/dL — ABNORMAL HIGH (ref 0.3–1.2)
Total Protein: 7.3 g/dL (ref 6.5–8.1)

## 2014-09-25 LAB — I-STAT CG4 LACTIC ACID, ED
Lactic Acid, Venous: 0.62 mmol/L (ref 0.5–2.0)
Lactic Acid, Venous: 0.74 mmol/L (ref 0.5–2.0)

## 2014-09-25 LAB — URINE MICROSCOPIC-ADD ON

## 2014-09-25 LAB — CBC WITH DIFFERENTIAL/PLATELET
BASOS ABS: 0 10*3/uL (ref 0.0–0.1)
Basophils Relative: 0 % (ref 0–1)
EOS PCT: 0 % (ref 0–5)
Eosinophils Absolute: 0 10*3/uL (ref 0.0–0.7)
HCT: 25.8 % — ABNORMAL LOW (ref 36.0–46.0)
HEMOGLOBIN: 8.5 g/dL — AB (ref 12.0–15.0)
LYMPHS ABS: 1.5 10*3/uL (ref 0.7–4.0)
LYMPHS PCT: 12 % (ref 12–46)
MCH: 30.1 pg (ref 26.0–34.0)
MCHC: 32.9 g/dL (ref 30.0–36.0)
MCV: 91.5 fL (ref 78.0–100.0)
Monocytes Absolute: 1.4 10*3/uL — ABNORMAL HIGH (ref 0.1–1.0)
Monocytes Relative: 11 % (ref 3–12)
NEUTROS PCT: 77 % (ref 43–77)
Neutro Abs: 9.8 10*3/uL — ABNORMAL HIGH (ref 1.7–7.7)
PLATELETS: 214 10*3/uL (ref 150–400)
RBC: 2.82 MIL/uL — AB (ref 3.87–5.11)
RDW: 13.3 % (ref 11.5–15.5)
WBC: 12.8 10*3/uL — ABNORMAL HIGH (ref 4.0–10.5)

## 2014-09-25 LAB — I-STAT TROPONIN, ED: TROPONIN I, POC: 0.03 ng/mL (ref 0.00–0.08)

## 2014-09-25 LAB — D-DIMER, QUANTITATIVE: D-Dimer, Quant: 1.33 ug/mL-FEU — ABNORMAL HIGH (ref 0.00–0.48)

## 2014-09-25 LAB — LIPASE, BLOOD: LIPASE: 27 U/L (ref 22–51)

## 2014-09-25 LAB — GLUCOSE, CAPILLARY: Glucose-Capillary: 154 mg/dL — ABNORMAL HIGH (ref 65–99)

## 2014-09-25 MED ORDER — GABAPENTIN 100 MG PO CAPS
100.0000 mg | ORAL_CAPSULE | Freq: Two times a day (BID) | ORAL | Status: DC
  Administered 2014-09-26 – 2014-09-27 (×3): 100 mg via ORAL
  Filled 2014-09-25 (×3): qty 1

## 2014-09-25 MED ORDER — DIAZEPAM 5 MG PO TABS
5.0000 mg | ORAL_TABLET | Freq: Two times a day (BID) | ORAL | Status: DC | PRN
  Administered 2014-09-26 (×2): 5 mg via ORAL
  Filled 2014-09-25 (×2): qty 1

## 2014-09-25 MED ORDER — SODIUM CHLORIDE 0.9 % IV SOLN
INTRAVENOUS | Status: DC
  Administered 2014-09-26: 100 mL/h via INTRAVENOUS
  Administered 2014-09-26 (×2): via INTRAVENOUS

## 2014-09-25 MED ORDER — IPRATROPIUM-ALBUTEROL 0.5-2.5 (3) MG/3ML IN SOLN
3.0000 mL | Freq: Two times a day (BID) | RESPIRATORY_TRACT | Status: DC
  Filled 2014-09-25: qty 3

## 2014-09-25 MED ORDER — CEFTRIAXONE SODIUM 1 G IJ SOLR
1.0000 g | Freq: Once | INTRAMUSCULAR | Status: AC
  Administered 2014-09-25: 1 g via INTRAVENOUS
  Filled 2014-09-25: qty 10

## 2014-09-25 MED ORDER — ACETAMINOPHEN 650 MG RE SUPP: 650.0000 mg | Freq: Four times a day (QID) | RECTAL | Status: DC | PRN

## 2014-09-25 MED ORDER — IRBESARTAN 150 MG PO TABS
150.0000 mg | ORAL_TABLET | Freq: Every day | ORAL | Status: DC
  Administered 2014-09-25 – 2014-09-27 (×3): 150 mg via ORAL
  Filled 2014-09-25 (×3): qty 1

## 2014-09-25 MED ORDER — SODIUM CHLORIDE 0.9 % IV SOLN
8.0000 mg | Freq: Four times a day (QID) | INTRAVENOUS | Status: DC | PRN
  Filled 2014-09-25: qty 4

## 2014-09-25 MED ORDER — LORATADINE 10 MG PO TABS
10.0000 mg | ORAL_TABLET | ORAL | Status: DC
  Administered 2014-09-25: 10 mg via ORAL
  Filled 2014-09-25: qty 1

## 2014-09-25 MED ORDER — CEFTRIAXONE SODIUM IN DEXTROSE 20 MG/ML IV SOLN
1.0000 g | INTRAVENOUS | Status: DC
  Administered 2014-09-26: 1 g via INTRAVENOUS
  Filled 2014-09-25 (×2): qty 50

## 2014-09-25 MED ORDER — INSULIN ASPART 100 UNIT/ML ~~LOC~~ SOLN: 0.0000 [IU] | Freq: Every day | SUBCUTANEOUS | Status: DC

## 2014-09-25 MED ORDER — DEXTROSE 5 % IV SOLN: 500.0000 mg | Freq: Once | INTRAVENOUS | Status: DC

## 2014-09-25 MED ORDER — ROSUVASTATIN CALCIUM 5 MG PO TABS
5.0000 mg | ORAL_TABLET | Freq: Every day | ORAL | Status: DC
  Administered 2014-09-25 – 2014-09-27 (×3): 5 mg via ORAL
  Filled 2014-09-25 (×3): qty 1

## 2014-09-25 MED ORDER — SODIUM CHLORIDE 0.9 % IJ SOLN: 3.0000 mL | Freq: Two times a day (BID) | INTRAMUSCULAR | Status: DC

## 2014-09-25 MED ORDER — ACETAMINOPHEN 325 MG PO TABS: 650.0000 mg | ORAL_TABLET | Freq: Four times a day (QID) | ORAL | Status: DC | PRN

## 2014-09-25 MED ORDER — LEVOCETIRIZINE DIHYDROCHLORIDE 5 MG PO TABS: 5.0000 mg | ORAL_TABLET | Freq: Every evening | ORAL | Status: DC

## 2014-09-25 MED ORDER — FUROSEMIDE 20 MG PO TABS
20.0000 mg | ORAL_TABLET | Freq: Every day | ORAL | Status: DC
  Administered 2014-09-25 – 2014-09-27 (×3): 20 mg via ORAL
  Filled 2014-09-25 (×3): qty 1

## 2014-09-25 MED ORDER — LEVOTHYROXINE SODIUM 112 MCG PO TABS
112.0000 ug | ORAL_TABLET | Freq: Every day | ORAL | Status: DC
  Administered 2014-09-26 – 2014-09-27 (×2): 112 ug via ORAL
  Filled 2014-09-25 (×2): qty 1

## 2014-09-25 MED ORDER — ONDANSETRON 8 MG PO TBDP
8.0000 mg | ORAL_TABLET | Freq: Once | ORAL | Status: AC
  Administered 2014-09-25: 8 mg via ORAL
  Filled 2014-09-25: qty 1

## 2014-09-25 MED ORDER — NEBIVOLOL HCL 10 MG PO TABS
10.0000 mg | ORAL_TABLET | Freq: Every day | ORAL | Status: DC
  Administered 2014-09-26 – 2014-09-27 (×2): 10 mg via ORAL
  Filled 2014-09-25 (×2): qty 1

## 2014-09-25 MED ORDER — SODIUM CHLORIDE 0.9 % IV SOLN: INTRAVENOUS | Status: DC

## 2014-09-25 MED ORDER — APIXABAN 2.5 MG PO TABS
2.5000 mg | ORAL_TABLET | Freq: Two times a day (BID) | ORAL | Status: DC
Start: 2014-09-25 — End: 2014-09-27
  Administered 2014-09-25 – 2014-09-27 (×4): 2.5 mg via ORAL
  Filled 2014-09-25 (×4): qty 1

## 2014-09-25 MED ORDER — AMLODIPINE BESYLATE 5 MG PO TABS
5.0000 mg | ORAL_TABLET | Freq: Every day | ORAL | Status: DC
  Administered 2014-09-25 – 2014-09-27 (×3): 5 mg via ORAL
  Filled 2014-09-25 (×3): qty 1

## 2014-09-25 MED ORDER — IPRATROPIUM-ALBUTEROL 0.5-2.5 (3) MG/3ML IN SOLN
3.0000 mL | RESPIRATORY_TRACT | Status: DC
  Filled 2014-09-25: qty 3

## 2014-09-25 MED ORDER — AZITHROMYCIN 250 MG PO TABS
500.0000 mg | ORAL_TABLET | ORAL | Status: DC
  Administered 2014-09-26: 500 mg via ORAL
  Filled 2014-09-25: qty 2

## 2014-09-25 MED ORDER — INSULIN ASPART 100 UNIT/ML ~~LOC~~ SOLN
0.0000 [IU] | Freq: Three times a day (TID) | SUBCUTANEOUS | Status: DC
  Administered 2014-09-26: 8 [IU] via SUBCUTANEOUS
  Administered 2014-09-26 (×2): 2 [IU] via SUBCUTANEOUS
  Administered 2014-09-27 (×2): 5 [IU] via SUBCUTANEOUS

## 2014-09-25 MED ORDER — SODIUM CHLORIDE 0.9 % IV SOLN
INTRAVENOUS | Status: DC
  Administered 2014-09-25: 19:00:00 via INTRAVENOUS

## 2014-09-25 MED ORDER — PREDNISONE 20 MG PO TABS
60.0000 mg | ORAL_TABLET | Freq: Every day | ORAL | Status: DC
  Administered 2014-09-26 – 2014-09-27 (×2): 60 mg via ORAL
  Filled 2014-09-25 (×3): qty 3

## 2014-09-25 MED ORDER — ONDANSETRON HCL 8 MG PO TABS: 8.0000 mg | ORAL_TABLET | Freq: Four times a day (QID) | ORAL | Status: DC | PRN

## 2014-09-25 NOTE — Telephone Encounter (Signed)
Per Dr. B patient needs to go to ER. Called ER to inform them that patient is on the way. Also called patient sons Louie Casa and Audry Pili no answer but i left a VM on both sons phones. I did get to talk to son Marden Noble informed Marden Noble. Marden Noble stated that it will be fine for the patient to go to the ER with her husband who is the driver. Marden Noble said that he would call them as soon as he gets closer to the hospital. Spoke with the patient and she was fine with that. Pt and husband left the office to head to the ER.

## 2014-09-25 NOTE — Progress Notes (Signed)
RT consult done per MD order.  RT protocol assessment performed.  Pt stated she is breathing so much better from earlier, has npc, denies sob or respiratory distress at this time.  No wheezes auscultated at this time.  Crackles noted on left upper/lower lobes.  Clear on right upper/middle lobe, crackles on right lower lobe.  Pt found on 2lnc, HR80, rr16, spo2 98%.  Pt stated that she only wears 2l [place] o2 at night and does not take any nebulizers or inhalers at home.  This RT advised pt of reason for my visit (assessment/nebulizer/abg).  Pt was very reluctant on taking nebulizer treatments. Pt expressed that nebulizer treatments "messed her teeth up."  This RTeducated pt on what breathing treatments are and why they are given, as well as tried to reassure her that the nebulizer treatments should not damage her teeth.  Pt said she is breathing fine right now and wants to skip it.  This RT informed pt that based on assessment, scheduled treatments will be ordered for her bid.  Pt stated she might be willing to try it again tomorrow.  Pt also refused abg, stating that she has already been stuck four times today.  Pt stated she might let someone try tomorrow.  Pt was advised that RT is available all night should she change her mind.  RN aware.

## 2014-09-25 NOTE — Progress Notes (Signed)
Subjective:    Patient ID: [name] [name], female    DOB: Sep 03, 1932, 81 y.o.   MRN: [id number]  HPI Patient seen today as a work in with 4 to five-day history of fever and overall weakness. She states 2 days ago she had fever up to 102.6 but she thinks her fever may have started as early as last Thursday. She's had shaking chills intermittently. She's had over the past couple days some nausea and vomiting. Occasional loose stools. Very weak. Poor appetite. Not drinking a lot of fluids. She's had some mild body aches. Rare cough. Denies any burning with urination or abdominal pain.   she has multiple chronic process including hypothyroidism, hyperlipidemia, depression, hypertension , type 2 diabetes, breast cancer , dissect heart failure, COPD , chronic kidney disease  Past Medical History  Diagnosis Date  . HYPOTHYROIDISM 07/24/2008  . HYPERLIPIDEMIA 07/24/2008  . DEPRESSION 05/16/2009  . HYPERTENSION 07/24/2008  . Vertigo   . Diabetes mellitus type II   . Arthritis     r tkr  . CVA (cerebral vascular accident) 2008    mini stroke.no residual  . Heart murmur     stress test 2009.dr peter [place]  . Intermittent vertigo   . Skin abnormality     facial lesions .pt applying mupiracin to areas  . Atrial fibrillation   . Breast cancer   . Pneumonia   . 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 Knee surgery  2009    TKR  . Cholecystectomy  1980  . Joint replacement      r knee  . Breast surgery  2007    Lumpectomy, XRT 2006.l breast  . Video assisted thoracoscopy  04/22/2011    Procedure: VIDEO ASSISTED THORACOSCOPY;  Surgeon: Melrose Nakayama, MD;  Location: [facility];  Service: Thoracic;  Laterality: Left;  WITH BIOPSY  . Heel spur surgery Right   . Exploratory laparotomy      reports that she quit smoking about 32 years ago. Her smoking use included Cigarettes. She has a 6 pack-year smoking history. She has never used smokeless  tobacco. She reports that she does not drink alcohol or use illicit drugs. family history includes Arthritis in her mother; Breast cancer in her sister; Cancer in her sister; Coronary artery disease in her brother and father; Heart disease in her father; Lung cancer in her brother; Rheum arthritis in her mother. Allergies  Allergen Reactions  . Codeine Sulfate Other (See Comments)    REACTION: GI upset  . Sulfonamide Derivatives Other (See Comments)    REACTION: GI upset  . Vancomycin Other (See Comments)    Red man syndrome  . Atarax [Hydroxyzine] Nausea Only and Rash        Review of Systems  Constitutional: Positive for fever, chills, appetite change and fatigue.  HENT: Negative for sinus pressure.   Respiratory: Negative for wheezing.   Cardiovascular: Negative for chest pain and palpitations.  Gastrointestinal: Positive for nausea, vomiting and diarrhea. Negative for abdominal pain.  Genitourinary: Negative for dysuria.  Skin: Negative for rash.  Neurological: Positive for weakness.  Hematological: Negative for adenopathy.       Objective:   Physical Exam  Constitutional: She appears well-developed and well-nourished.  Alert but somewhat ill-appearing  HENT:  Right Ear: External ear normal.  Left Ear: External ear normal.  Oropharynx slightly dry otherwise clear  Neck: Neck supple. No thyromegaly present.  Cardiovascular: Normal rate.  Irregular rhythm  Pulmonary/Chest:  Slightly diminished breath sounds throughout. No focal rales  Abdominal: Soft. There is no tenderness.  Musculoskeletal: She exhibits no edema.  Lymphadenopathy:    She has no cervical adenopathy.  Neurological: She is alert.          Assessment & Plan:  Several day history of fever and progressive weakness. Clinically, suspect she has some dehydration. Etiology of fever this point unclear-?UTI vs Pneumonia vs other. She needs further evaluation. We've recommended ER evaluation to further  assess and patient agrees. May need hospitalization.  She appears to be at least moderately dehydrated

## 2014-09-25 NOTE — ED Notes (Signed)
Pt to floor at 18:50

## 2014-09-25 NOTE — Progress Notes (Signed)
Utilization Review completed.  Nevia Henkin RN CM  

## 2014-09-25 NOTE — Progress Notes (Signed)
Pre visit review using our clinic review tool, if applicable. No additional management support is needed unless otherwise documented below in the visit note.

## 2014-09-25 NOTE — ED Notes (Signed)
Pt c/o fever, emesis, and generalized body aches x 4 days.  Pain score 3/10.  Pt reports "I can't keep anything down, so I just quit trying.  I just don't feel good."  Pt sts that she has not taken any of her medications today.

## 2014-09-25 NOTE — ED Notes (Signed)
No In and Out needed, patient urinated on her own

## 2014-09-25 NOTE — ED Notes (Signed)
Patient transported to X-ray

## 2014-09-25 NOTE — H&P (Addendum)
[facility] History and Physical  [name] [name] RCB:[id number] DOB: Aug 31, 1932 DOA: 09/25/2014  Referring physician: Dr Ronalee Red PCP: [name], MD   Chief Complaint: SOB and fevers  HPI: [name] [name] is a 81 y.o. female  4-5 days ago pt developed nausea and emesis and SOB. Associated w/ fevers to 102.6, chills, loss of appetite. Tylenol w/ some relief. Patient has been unable to take her home medications for the last 3 days due to nausea and vomiting. Symptoms improved with rest. Patient reports becoming very easily fatigued with minimal exertion. Denies dysuria, frequency, back pain, chest pain. Symptoms are intermittent and getting worse.  Review of Systems:  Constitutional:  No weight loss, night sweats, Fevers, chills, fatigue.  HEENT:  No headaches, Difficulty swallowing,Tooth/dental problems,Sore throat,  No sneezing, itching, ear ache, nasal congestion, post nasal drip,  Cardio-vascular:  No chest pain, Orthopnea, PND, swelling in lower extremities, anasarca, dizziness, palpitations  GI:  No heartburn, indigestion, abdominal pain, nausea, vomiting, diarrhea, change in bowel habits, loss of appetite  Resp: Per HPI Skin:  no rash or lesions.  GU:  no dysuria, change in color of urine, no urgency or frequency. No flank pain.  Musculoskeletal:   No joint pain or swelling. No decreased range of motion. No back pain.  Psych:  No change in mood or affect. No depression or anxiety. No memory loss.   Past Medical History  Diagnosis Date  . HYPOTHYROIDISM 07/24/2008  . HYPERLIPIDEMIA 07/24/2008  . DEPRESSION 05/16/2009  . HYPERTENSION 07/24/2008  . Vertigo   . Diabetes mellitus type II   . Arthritis     r tkr  . CVA (cerebral vascular accident) 2008    mini stroke.no residual  . Heart murmur     stress test 2009.dr peter [place]  . Intermittent vertigo   . Skin abnormality     facial lesions .pt applying mupiracin to areas  . Atrial fibrillation   .  Breast cancer   . Pneumonia   . 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 Knee surgery  2009    TKR  . Cholecystectomy  1980  . Joint replacement      r knee  . Breast surgery  2007    Lumpectomy, XRT 2006.l breast  . Video assisted thoracoscopy  04/22/2011    Procedure: VIDEO ASSISTED THORACOSCOPY;  Surgeon: Melrose Nakayama, MD;  Location: [facility];  Service: Thoracic;  Laterality: Left;  WITH BIOPSY  . Heel spur surgery Right   . Exploratory laparotomy     Social History:  reports that she quit smoking about 32 years ago. Her smoking use included Cigarettes. She has a 6 pack-year smoking history. She has never used smokeless tobacco. She reports that she does not drink alcohol or use illicit drugs.  Allergies  Allergen Reactions  . Codeine Sulfate Other (See Comments)    REACTION: GI upset  . Sulfonamide Derivatives Other (See Comments)    REACTION: GI upset  . Vancomycin Other (See Comments)    Red man syndrome  . Atarax [Hydroxyzine] Nausea Only and Rash    Family History  Problem Relation Age of Onset  . Arthritis Mother   . Rheum arthritis Mother   . Heart disease Father   . Coronary artery disease Father   . Cancer Sister     breast CA, both sisters  . Breast cancer Sister   . Coronary artery disease Brother   .  Lung cancer Brother      Prior to Admission medications   Medication Sig Start Date End Date Taking? Authorizing Provider  amLODipine (NORVASC) 5 MG tablet TAKE ONE TABLET BY MOUTH ONCE DAILY 08/03/14  Yes [name], MD  apixaban (ELIQUIS) 5 MG TABS tablet Take 1 tablet (5 mg total) by mouth 2 (two) times daily. 05/11/14  Yes Peter M [place], MD  BYSTOLIC 10 MG tablet TAKE ONE TABLET BY MOUTH ONCE DAILY AFTER BREAKFAST 08/14/14  Yes [name], MD  CALCIUM CITRATE PO Take 1,200 mg by mouth daily.     Yes Historical Provider, MD  cholecalciferol (VITAMIN D) 1000 UNITS tablet Take 3,000 Units by  mouth daily.     Yes Historical Provider, MD  diazepam (VALIUM) 5 MG tablet TAKE ONE TABLET BY MOUTH EVERY 12 HOURS AS NEEDED FOR ANXIETY 06/12/14  Yes [name], MD  furosemide (LASIX) 20 MG tablet TAKE ONE TABLET BY MOUTH ONCE DAILY 05/01/14  Yes [name], MD  gabapentin (NEURONTIN) 100 MG capsule Take 1 capsule (100 mg total) by mouth 2 (two) times daily before a meal. 11/08/12  Yes Erick Colace, NP  glimepiride (AMARYL) 2 MG tablet TAKE ONE TABLET BY MOUTH ONCE DAILY BEFORE  BREAKFAST. 09/25/14  Yes [name], MD  levocetirizine (XYZAL) 5 MG tablet Take 1 tablet (5 mg total) by mouth every evening. 05/17/14  Yes [name], MD  levothyroxine (SYNTHROID, LEVOTHROID) 112 MCG tablet TAKE ONE TABLET BY MOUTH ONCE DAILY BEFORE BREAKFAST 04/24/14  Yes [name], MD  loratadine (CLARITIN) 10 MG tablet Take 10 mg by mouth daily as needed for allergies.   Yes Historical Provider, MD  predniSONE (DELTASONE) 5 MG tablet Alternate 1 tablet (2m) with 2 tablets (19m every other day 08/24/14  Yes ClDeneise LeverMD  rosuvastatin (CRESTOR) 5 MG tablet Take 1 tablet (5 mg total) by mouth daily. 01/03/14  Yes BrEulas PostMD  valsartan (DIOVAN) 160 MG tablet TAKE ONE TABLET BY MOUTH ONCE DAILY 07/03/14  Yes BrEulas PostMD   Physical Exam: Filed Vitals:   09/25/14 1607 09/25/14 1700 09/25/14 1827 09/25/14 1927  BP: 118/60 102/52 112/49 127/58  Pulse: 76  92 92  Temp:    99 F (37.2 C)  TempSrc:    Oral  Resp: 34 35 32 20  SpO2: 95%  95% 97%    Wt Readings from Last 3 Encounters:  09/25/14 90.266 kg (199 lb)  09/19/14 91.173 kg (201 lb)  08/04/14 91.627 kg (202 lb)    General:  Appears calm and comfortable Eyes:  PERRL, normal lids, irises & conjunctiva ENT: Dry mucous membranes  Neck:  no LAD, masses or thyromegaly Cardiovascular:  RRR, IV/VI systolic murmur. Trace Lotrimin edema, no carotid bruits Telemetry: A. fib Respiratory: Increased  respiratory effort, crackles and wheezing throughout, decreased breath sounds throughout, on nasal cannula Abdomen:  soft, ntnd Skin:  no rash or induration seen on limited exam Musculoskeletal:  grossly normal tone BUE/BLE Psychiatric:  grossly normal mood and affect, speech fluent and appropriate Neurologic:  grossly non-focal.          Labs on Admission:  Basic Metabolic Panel:  Recent Labs Lab 09/25/14 1246  NA 127*  K 4.5  CL 96*  CO2 22  GLUCOSE 114*  BUN 30*  CREATININE 1.94*  CALCIUM 8.4*   Liver Function Tests:  Recent Labs Lab 09/25/14 1246  AST 23  ALT 15  ALKPHOS 81  BILITOT 2.0*  PROT 7.3  ALBUMIN 3.4*    Recent Labs Lab 09/25/14 1246  LIPASE 27   No results for input(s): AMMONIA in the last 168 hours. CBC:  Recent Labs Lab 09/25/14 1246  WBC 12.8*  NEUTROABS 9.8*  HGB 8.5*  HCT 25.8*  MCV 91.5  PLT 214   Cardiac Enzymes: No results for input(s): CKTOTAL, CKMB, CKMBINDEX, TROPONINI in the last 168 hours.  BNP (last 3 results)  Recent Labs  04/04/14 1039  BNP 661.3*    ProBNP (last 3 results) No results for input(s): PROBNP in the last 8760 hours.  CBG: No results for input(s): GLUCAP in the last 168 hours.  Radiological Exams on Admission: Dg Chest 2 View  09/25/2014   CLINICAL DATA:  Fever, generalized body aches, emesis, loss of appetite for 1 week, history CHF, diabetes mellitus, former smoker, hypertension, breast cancer, Wegner's granulomatosis  EXAM: CHEST  2 VIEW  COMPARISON:  04/04/2014  FINDINGS: Enlargement of cardiac silhouette.  Calcified tortuous thoracic aorta.  Pulmonary vascular congestion.  New LEFT upper lobe opacity likely representing pneumonia.  Remaining lungs grossly clear.  No pleural effusion or pneumothorax.  Bones demineralized.  IMPRESSION: Enlargement of cardiac silhouette with pulmonary vascular congestion.  New LEFT upper lobe opacity likely representing pneumonia; followup PA and lateral chest X-ray  is recommended in 3-4 weeks following trial of antibiotic therapy to ensure resolution and exclude underlying malignancy.   Electronically Signed   By: Lavonia Dana M.D.   On: 09/25/2014 16:41    Assessment/Plan Principal Problem:   CAP (community acquired pneumonia) Active Problems:   Hypothyroidism   Wegener's granulomatosis (granulomatosis with polyangiitis)   CKD (chronic kidney disease) stage 3, GFR 30-59 ml/min   Type 2 diabetes mellitus, controlled   AKI (acute kidney injury)   Hereditary and idiopathic peripheral neuropathy   Dependent edema   SOB/CAP: Likely secondary to pneumonia but cannot rule out PE at this time. History of underlying Wegener's granulomatosis. On prednisone. Chest x-ray consistent with left upper lobe pneumonia. D-dimer elevated at 1.33. Patient has been off her Eliquis for 3 days. Lactic acid 0.74. WBC 12.8. - Tele - Continue Azithro/CTX - BCX, sputum Cx, Legionella and Strep Ag - Consider CTA to r/o PE in am once renal function improves. Pt already on treatment as back on Eliquis - Prednisone 60 daily (on baseline alternating 5 and 10 mg doses of prednisone for Wegener's.) - DuoNeb nebs every 4hrs - ABG  DM: Last A1c 6.8, 07/2014 - SSI   Possible UTI: UA w/ possible UTI but asymptomatic. On CTX for CAP. - UCX  AKI/CKD: Codeine 1.94. Baseline 1.4. Likely secondary to poor by mouth and dehydration. - IVF - BMET in am  HTN: normotensive - continue Norvasc, Valsartan, Bystolic  Hypothyroid: -  continue Synthroid  HLD: - Continue Crestor   Neuropathy: - Continue Neurontin  Dependent edema. At baseline. Echo without evidence of CHF. - Continue Lasix  Anxiety: - cont Valium    Code Status: FULL DVT Prophylaxis: Eliquis Family Communication: Sons Disposition Plan: Pending improvement  MERRELL, DAVID Lenna Sciara, MD Family Medicine [facility] [email or web address] Password [email or web address]

## 2014-09-25 NOTE — ED Provider Notes (Signed)
CSN: [id number]     Arrival date & time 09/25/14  1220 History   First MD Initiated Contact with Patient 09/25/14 1535     Chief Complaint  Patient presents with  . Fever  . Generalized Body Aches  . Emesis     (Consider location/radiation/quality/duration/timing/severity/associated sxs/prior Treatment) HPI   [name] [name] is a 81 y.o. female who presents for evaluation of fever, myalgias and vomiting for 4 days. She saw her PCP today who sent her here for evaluation of congestive heart failure, pneumonia. She denies cough. She is short of breath and that gets worse with walking. She did not take any of her medications today. No known sick contacts. She is chronic vertigo which makes walking very difficult and is almost completely confined to wheelchair because of it. She has daily episodes of vertigo. She uses Valium for vertigo. No known sick contacts. She denies chest pain, paresthesias, headache or new weakness. She feels fatigued. There are no other known modifying factors.     Past Medical History  Diagnosis Date  . HYPOTHYROIDISM 07/24/2008  . HYPERLIPIDEMIA 07/24/2008  . DEPRESSION 05/16/2009  . HYPERTENSION 07/24/2008  . Vertigo   . Diabetes mellitus type II   . Arthritis     r tkr  . CVA (cerebral vascular accident) 2008    mini stroke.no residual  . Heart murmur     stress test 2009.dr peter Martinique  . Intermittent vertigo   . Skin abnormality     facial lesions .pt applying mupiracin to areas  . Atrial fibrillation   . Breast cancer   . Pneumonia   . 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 Knee surgery  2009    TKR  . Cholecystectomy  1980  . Joint replacement      r knee  . Breast surgery  2007    Lumpectomy, XRT 2006.l breast  . Video assisted thoracoscopy  04/22/2011    Procedure: VIDEO ASSISTED THORACOSCOPY;  Surgeon: Melrose Nakayama, MD;  Location: [facility];  Service: Thoracic;  Laterality: Left;  WITH  BIOPSY  . Heel spur surgery Right   . Exploratory laparotomy     Family History  Problem Relation Age of Onset  . Arthritis Mother   . Rheum arthritis Mother   . Heart disease Father   . Coronary artery disease Father   . Cancer Sister     breast CA, both sisters  . Breast cancer Sister   . Coronary artery disease Brother   . Lung cancer Brother    History  Substance Use Topics  . Smoking status: Former Smoker -- 0.50 packs/day for 12 years    Types: Cigarettes    Quit date: 06/04/1982  . Smokeless tobacco: Never Used  . Alcohol Use: No   OB History    No data available     Review of Systems  All other systems reviewed and are negative.     Allergies  Codeine sulfate; Sulfonamide derivatives; Vancomycin; and Atarax  Home Medications   Prior to Admission medications   Medication Sig Start Date End Date Taking? Authorizing Provider  amLODipine (NORVASC) 5 MG tablet TAKE ONE TABLET BY MOUTH ONCE DAILY 08/03/14  Yes [name], MD  apixaban (ELIQUIS) 5 MG TABS tablet Take 1 tablet (5 mg total) by mouth 2 (two) times daily. 05/11/14  Yes Peter M Martinique, MD  BYSTOLIC 10 MG tablet TAKE ONE TABLET BY MOUTH ONCE  DAILY AFTER BREAKFAST 08/14/14  Yes [name], MD  CALCIUM CITRATE PO Take 1,200 mg by mouth daily.     Yes Historical Provider, MD  cholecalciferol (VITAMIN D) 1000 UNITS tablet Take 3,000 Units by mouth daily.     Yes Historical Provider, MD  diazepam (VALIUM) 5 MG tablet TAKE ONE TABLET BY MOUTH EVERY 12 HOURS AS NEEDED FOR ANXIETY 06/12/14  Yes [name], MD  furosemide (LASIX) 20 MG tablet TAKE ONE TABLET BY MOUTH ONCE DAILY 05/01/14  Yes [name], MD  gabapentin (NEURONTIN) 100 MG capsule Take 1 capsule (100 mg total) by mouth 2 (two) times daily before a meal. 11/08/12  Yes Erick Colace, NP  glimepiride (AMARYL) 2 MG tablet TAKE ONE TABLET BY MOUTH ONCE DAILY BEFORE  BREAKFAST. 09/25/14  Yes [name], MD  levocetirizine (XYZAL)  5 MG tablet Take 1 tablet (5 mg total) by mouth every evening. 05/17/14  Yes [name], MD  levothyroxine (SYNTHROID, LEVOTHROID) 112 MCG tablet TAKE ONE TABLET BY MOUTH ONCE DAILY BEFORE BREAKFAST 04/24/14  Yes [name], MD  loratadine (CLARITIN) 10 MG tablet Take 10 mg by mouth daily as needed for allergies.   Yes Historical Provider, MD  predniSONE (DELTASONE) 5 MG tablet Alternate 1 tablet (48m) with 2 tablets (173m every other day 08/24/14  Yes ClDeneise LeverMD  rosuvastatin (CRESTOR) 5 MG tablet Take 1 tablet (5 mg total) by mouth daily. 01/03/14  Yes BrEulas PostMD  valsartan (DIOVAN) 160 MG tablet TAKE ONE TABLET BY MOUTH ONCE DAILY 07/03/14  Yes BrEulas PostMD   BP 102/52 mmHg  Pulse 76  Temp(Src) 99.7 F (37.6 C) (Oral)  Resp 35  SpO2 95% Physical Exam  Constitutional: She appears well-developed.  Elderly, frail  HENT:  Head: Normocephalic and atraumatic.  Right Ear: External ear normal.  Left Ear: External ear normal.  Eyes: Conjunctivae and EOM are normal. Pupils are equal, round, and reactive to light.  Neck: Normal range of motion and phonation normal. Neck supple.  Cardiovascular: Normal rate and regular rhythm.   2/6 systolic murmur at the base. No JVD.  Pulmonary/Chest: Effort normal. No respiratory distress. She exhibits no bony tenderness.  Scattered rhonchi and rales. No significant wheezing. Air movement is fair.  Abdominal: Soft. There is no tenderness.  Musculoskeletal: Normal range of motion.  Neurological: She is alert. No cranial nerve deficit or sensory deficit. She exhibits normal muscle tone. Coordination normal.  Oriented to person and place. Poor historian.  Skin: Skin is warm, dry and intact. No rash noted. No erythema.  Psychiatric: She has a normal mood and affect. Her behavior is normal.  Nursing note and vitals reviewed.   ED Course  Procedures (including critical care time) Nonspecific symptoms. No clinical  evidence for CHF. No fever or cough consistent with pneumonia. Respiratory rate is elevated, so will check d-dimer to evaluate for PE.  Medications  cefTRIAXone (ROCEPHIN) 1 g in dextrose 5 % 50 mL IVPB (not administered)  azithromycin (ZITHROMAX) 500 mg in dextrose 5 % 250 mL IVPB (not administered)  0.9 %  sodium chloride infusion (not administered)  ondansetron (ZOFRAN-ODT) disintegrating tablet 8 mg (8 mg Oral Given 09/25/14 1551)    Patient Vitals for the past 24 hrs:  BP Temp Temp src Pulse Resp SpO2  09/25/14 1700 (!) 102/52 mmHg - - - (!) 35 -  09/25/14 1607 118/60 mmHg - - 76 (!) 34 95 %  09/25/14 1553 101/55 mmHg - - 87 16 92 %  09/25/14 1228 131/80 mmHg 99.7 F (37.6 C) Oral (!) 50 19 94 %    6:01 PM Reevaluation with update and discussion. After initial assessment and treatment, an updated evaluation reveals clinical status unchanged.Richarda Blade   6:17 PM-Consult complete with hospitalist. Patient case explained and discussed. He agrees to admit patient for further evaluation and treatment. Call ended at 18:25   CRITICAL CARE Performed by: Richarda Blade Total critical care time: 35 minutes Critical care time was exclusive of separately billable procedures and treating other patients. Critical care was necessary to treat or prevent imminent or life-threatening deterioration. Critical care was time spent personally by me on the following activities: development of treatment plan with patient and/or surrogate as well as nursing, discussions with consultants, evaluation of patient's response to treatment, examination of patient, obtaining history from patient or surrogate, ordering and performing treatments and interventions, ordering and review of laboratory studies, ordering and review of radiographic studies, pulse oximetry and re-evaluation of patient's condition.   Labs Review Labs Reviewed  CBC WITH DIFFERENTIAL/PLATELET - Abnormal; Notable for the following:     WBC 12.8 (*)    RBC 2.82 (*)    Hemoglobin 8.5 (*)    HCT 25.8 (*)    Neutro Abs 9.8 (*)    Monocytes Absolute 1.4 (*)    All other components within normal limits  COMPREHENSIVE METABOLIC PANEL - Abnormal; Notable for the following:    Sodium 127 (*)    Chloride 96 (*)    Glucose, Bld 114 (*)    BUN 30 (*)    Creatinine, Ser 1.94 (*)    Calcium 8.4 (*)    Albumin 3.4 (*)    Total Bilirubin 2.0 (*)    GFR calc non Af Amer 23 (*)    GFR calc Af Amer 27 (*)    All other components within normal limits  URINALYSIS, ROUTINE W REFLEX MICROSCOPIC (NOT AT [facility]) - Abnormal; Notable for the following:    Color, Urine AMBER (*)    APPearance CLOUDY (*)    Hgb urine dipstick MODERATE (*)    Protein, ur 100 (*)    Leukocytes, UA MODERATE (*)    All other components within normal limits  D-DIMER, QUANTITATIVE (NOT AT [facility]) - Abnormal; Notable for the following:    D-Dimer, Quant 1.33 (*)    All other components within normal limits  URINE MICROSCOPIC-ADD ON - Abnormal; Notable for the following:    Squamous Epithelial / LPF FEW (*)    Bacteria, UA FEW (*)    All other components within normal limits  URINE CULTURE  LIPASE, BLOOD  I-STAT TROPOININ, ED  I-STAT CG4 LACTIC ACID, ED  I-STAT CG4 LACTIC ACID, ED    Imaging Review Dg Chest 2 View  09/25/2014   CLINICAL DATA:  Fever, generalized body aches, emesis, loss of appetite for 1 week, history CHF, diabetes mellitus, former smoker, hypertension, breast cancer, Wegner's granulomatosis  EXAM: CHEST  2 VIEW  COMPARISON:  04/04/2014  FINDINGS: Enlargement of cardiac silhouette.  Calcified tortuous thoracic aorta.  Pulmonary vascular congestion.  New LEFT upper lobe opacity likely representing pneumonia.  Remaining lungs grossly clear.  No pleural effusion or pneumothorax.  Bones demineralized.  IMPRESSION: Enlargement of cardiac silhouette with pulmonary vascular congestion.  New LEFT upper lobe opacity likely representing pneumonia; followup  PA and lateral chest X-ray is recommended in 3-4 weeks following trial of antibiotic therapy  to ensure resolution and exclude underlying malignancy.   Electronically Signed   By: Lavonia Dana M.D.   On: 09/25/2014 16:41   Radiologic imaging report reviewed and images byradiography viewed, by me.   EKG Interpretation   Date/Time:  Monday September 25 2014 16:05:38 EDT Ventricular Rate:  101 PR Interval:    QRS Duration: 103 QT Interval:  343 QTC Calculation: 445 R Axis:   65 Text Interpretation:  Atrial fibrillation since last tracing no  significant change Confirmed by Eulis Foster  MD, Vira Agar ([id number]) on 09/25/2014  4:52:08 PM      MDM   Final diagnoses:  Community acquired pneumonia  UTI (lower urinary tract infection)  Acute kidney injury    Weakness, multifactorial, with apparent UTI, community-acquired pneumonia and renal insufficiency, higher than baseline. She will require admission for stabilization. Screening d-dimer is elevated, however, this nonspecific, especially in the finding of acute infectious process. I think that she is a low risk for PE. Therefore, this can be evaluated, later, when the reassess is improved after IV hydration. She will require admission.  Nursing Notes Reviewed/ Care Coordinated, and agree without changes. Applicable Imaging Reviewed.  Interpretation of Laboratory Data incorporated into ED treatment  Plan: Admit    Daleen Bo, MD 09/25/14 [phone]

## 2014-09-26 LAB — BASIC METABOLIC PANEL
Anion gap: 9 (ref 5–15)
BUN: 28 mg/dL — ABNORMAL HIGH (ref 6–20)
CO2: 23 mmol/L (ref 22–32)
Calcium: 8 mg/dL — ABNORMAL LOW (ref 8.9–10.3)
Chloride: 98 mmol/L — ABNORMAL LOW (ref 101–111)
Creatinine, Ser: 1.89 mg/dL — ABNORMAL HIGH (ref 0.44–1.00)
GFR calc Af Amer: 28 mL/min — ABNORMAL LOW (ref >60)
GFR, EST NON AFRICAN AMERICAN: 24 mL/min — AB (ref >60)
Glucose, Bld: 149 mg/dL — ABNORMAL HIGH (ref 65–99)
Potassium: 4.5 mmol/L (ref 3.5–5.1)
SODIUM: 130 mmol/L — AB (ref 135–145)

## 2014-09-26 LAB — CBC
HCT: 22.9 % — ABNORMAL LOW (ref 36.0–46.0)
HEMOGLOBIN: 7.5 g/dL — AB (ref 12.0–15.0)
MCH: 30.4 pg (ref 26.0–34.0)
MCHC: 32.8 g/dL (ref 30.0–36.0)
MCV: 92.7 fL (ref 78.0–100.0)
Platelets: 218 10*3/uL (ref 150–400)
RBC: 2.47 MIL/uL — AB (ref 3.87–5.11)
RDW: 13.6 % (ref 11.5–15.5)
WBC: 10.2 10*3/uL (ref 4.0–10.5)

## 2014-09-26 LAB — GLUCOSE, CAPILLARY
Glucose-Capillary: 127 mg/dL — ABNORMAL HIGH (ref 65–99)
Glucose-Capillary: 131 mg/dL — ABNORMAL HIGH (ref 65–99)
Glucose-Capillary: 296 mg/dL — ABNORMAL HIGH (ref 65–99)
Glucose-Capillary: 422 mg/dL — ABNORMAL HIGH (ref 65–99)

## 2014-09-26 LAB — LEGIONELLA ANTIGEN, URINE

## 2014-09-26 LAB — HIV ANTIBODY (ROUTINE TESTING W REFLEX): HIV Screen 4th Generation wRfx: NONREACTIVE

## 2014-09-26 LAB — STREP PNEUMONIAE URINARY ANTIGEN: Strep Pneumo Urinary Antigen: NEGATIVE

## 2014-09-26 LAB — INFLUENZA PANEL BY PCR (TYPE A & B)
H1N1 flu by pcr: NOT DETECTED
Influenza A By PCR: NEGATIVE
Influenza B By PCR: NEGATIVE

## 2014-09-26 MED ORDER — INSULIN ASPART 100 UNIT/ML ~~LOC~~ SOLN
10.0000 [IU] | Freq: Once | SUBCUTANEOUS | Status: AC
  Administered 2014-09-26: 10 [IU] via SUBCUTANEOUS

## 2014-09-26 MED ORDER — IPRATROPIUM-ALBUTEROL 0.5-2.5 (3) MG/3ML IN SOLN: 3.0000 mL | RESPIRATORY_TRACT | Status: DC | PRN

## 2014-09-26 NOTE — Evaluation (Signed)
Physical Therapy Evaluation Patient Details Name: [name] [name] MRN: [id number] DOB: 24-Feb-1933 Today's Date: 09/26/2014   History of Present Illness  81 yo female admitted with pna. Hx of A fib, CVA, HTN, DM, Wegener's, COPD, vertigo  Clinical Impression  On eval, pt required Min guard assist for mobility-able to ambulate ~125 feet with RW. Pt tolerated session well. Dyspnea 2/4 with activity.     Follow Up Recommendations Supervision for mobility/OOB    Equipment Recommendations  None recommended by PT    Recommendations for Other Services OT consult     Precautions / Restrictions Precautions Precautions: Fall Restrictions Weight Bearing Restrictions: No      Mobility  Bed Mobility Overal bed mobility: Needs Assistance Bed Mobility: Supine to Sit     Supine to sit: Min guard        Transfers Overall transfer level: Needs assistance Equipment used: Rolling walker (2 wheeled) Transfers: Sit to/from Stand Sit to Stand: Min guard            Ambulation/Gait Ambulation/Gait assistance: Min guard Ambulation Distance (Feet): 125 Feet Assistive device: Rolling walker (2 wheeled) Gait Pattern/deviations: Step-through pattern;Decreased stride length     General Gait Details: slow gait speed.   Stairs            Wheelchair Mobility    Modified Rankin (Stroke Patients Only)       Balance Overall balance assessment: Needs assistance         Standing balance support: Bilateral upper extremity supported;During functional activity Standing balance-Leahy Scale: Poor Standing balance comment: needs RW                             Pertinent Vitals/Pain Pain Assessment: No/denies pain    Home Living Family/patient expects to be discharged to:: Private residence Living Arrangements: Spouse/significant other Available Help at Discharge: Family Type of Home: House Home Access: Stairs to enter   Technical brewer of Steps: 1 Home  Layout: One level Home Equipment: Environmental consultant - 2 wheels;Cane - single point;Grab bars - toilet;Grab bars - tub/shower      Prior Function Level of Independence: Independent with assistive device(s)         Comments: RW for ambulation     Hand Dominance        Extremity/Trunk Assessment   Upper Extremity Assessment: Overall WFL for tasks assessed           Lower Extremity Assessment: Generalized weakness      Cervical / Trunk Assessment: Normal  Communication   Communication: No difficulties  Cognition Arousal/Alertness: Awake/alert Behavior During Therapy: WFL for tasks assessed/performed Overall Cognitive Status: Within Functional Limits for tasks assessed                      General Comments      Exercises        Assessment/Plan    PT Assessment Patient needs continued PT services  PT Diagnosis Difficulty walking;Generalized weakness   PT Problem List Decreased strength;Decreased activity tolerance;Decreased balance;Decreased mobility  PT Treatment Interventions DME instruction;Gait training;Functional mobility training;Therapeutic activities;Therapeutic exercise;Patient/family education;Balance training   PT Goals (Current goals can be found in the Care Plan section) Acute Rehab PT Goals Patient Stated Goal: home soon PT Goal Formulation: With patient Time For Goal Achievement: 10/10/14 Potential to Achieve Goals: Good    Frequency Min 3X/week   Barriers to discharge        Co-evaluation  End of Session Equipment Utilized During Treatment: Gait belt Activity Tolerance: Patient tolerated treatment well Patient left: in chair;with call bell/phone within reach           Time: 5170-0174 PT Time Calculation (min) (ACUTE ONLY): 22 min   Charges:   PT Evaluation $Initial PT Evaluation Tier I: 1 Procedure     PT G Codes:        Weston Anna, MPT Pager: [phone]

## 2014-09-26 NOTE — Progress Notes (Addendum)
[facility] PROGRESS NOTE  [name] [name] ZOX:[id number] DOB: [birth year] DOA: 09/25/2014 PCP: Kristian Covey, MD  Brief Summary  The patient is an 81 year old female with history of hypertension, hyperlipidemia, atrial fibrillation, type 2 diabetes mellitus, Wegener's disease with recurrent pneumonia, hypothyroidism, depression who presented with a 4-5 day history of nausea, vomiting, shortness of breath, fevers to 102.6 Fahrenheit, chills. She also had progressive fatigue. She denied dysuria.   Assessment/Plan  Acute hypoxic respiratory failure secondary to community-acquired pneumonia versus Wegener's granulomatosis. Her chest x-ray demonstrated left upper lobe pneumonia. She had an elevated d-dimer which was likely secondary to her pneumonia. She is not a candidate for CT anterior chest secondary to her acute on chronic kidney disease. VQ scan would be unhelpful because of her underlying lung disease with superimposed pneumonia. She is already on chronic anticoagulation. -  Continue ceftriaxone and azithromycin day 2 -  Blood cultures no growth to date -  Unable to obtain sputum culture yet -  Flu negative -  Strep pneumo negative -  Legionella pending -  HIV pending -  Continue prednisone increased to 60 mg daily -  Continue duonebs prn  Probable UTI with TNTC WBC -  F/u urine culture -  continue ceftriaxone  Diabetes mellitus type 2 with peripheral neuropathy hemoglobin A1c of 6.8 on 07/2014 -  Anticipate rise in CBG on prednisone  -  Continue sliding scale insulin for now   Probable chronic diastolic heart failure suggested by LVH on echocardiogram, moderate mitral valve regurgitation with dilated left atrium. He also has a moderately dilated right ventricle with reduced systolic function and a severely dilated right atrium associated with moderate tricuspid valve regurgitation.  -  Continue lasix  Chronic Paroxysmal atrial fibrillation, rate controlled -  CHADs2vasc = 5   -  HASBLED = 3  -  Continue apixaban  Essential hypertension/hyperlipidemia, blood pressure stable, continue Norvasc, valsartan, and by systolic and rosuvastatin  Acute on chronic kidney disease stage IV, baseline creatinine 1.4 due to diabetes and high blood pressure -  Minimize nephrotoxins and renally dose medications  Hypothyroidism, stable, continue Synthroid  Diabetic neuropathy, stable, continue Neurontin   Anxiety, stable, continue Valium   Normocytic anemia -  Iron studies, B12, folate -  TSH -  Occult stool -  Repeat hgb in AM  Hyponatremia, chronic and stable, due to Wegener's and heart failure  Diet:   diabetic/healthy heart  Access: PIV  IVF:   off  Proph:  Apixaban  Code Status: full Family Communication: patient and her son Disposition Plan: likely home tomorrow if hemoglobin stable, tolerating diet.  She already has home oxygen.  D/c on oral antibiotics and prednisone burst.     Consultants:  none  Procedures:  CXR  Antibiotics:  Ceftriaxone 6/20 >  azithro 6/20 >   HPI/Subjective:  SOB has improved.  Not coughing.  Nausea and vomiting have improved and she is starting to eat some.    Objective: Filed Vitals:   09/25/14 1700 09/25/14 1827 09/25/14 1927 09/26/14 0542  BP: 102/52 112/49 127/58 114/61  Pulse:  92 92 78  Temp:   99 F (37.2 C) 98.3 F (36.8 C)  TempSrc:   Oral Oral  Resp: 35 32 20 20  Height:    5\' 6"  (1.676 m)  Weight:    89.812 kg (198 lb)  SpO2:  95% 97% 98%    Intake/Output Summary (Last 24 hours) at 09/26/14 1226 Last data filed at 09/26/14 0912  Gross per  24 hour  Intake 2817.5 ml  Output    450 ml  Net 2367.5 ml   Filed Weights   09/26/14 0542  Weight: 89.812 kg (198 lb)    Exam:   General:  Adult female, nasal canula in place, No acute distress  HEENT:  NCAT, MMM  Cardiovascular:  IRRR, nl S1, S2 no mrg, 2+ pulses, warm extremities  Respiratory:  Rales at the bilateral bases and louder rales  at the left upper back with some pops and squeaks/wheezes, no rhonchi, no increased WOB  Abdomen:   NABS, soft, NT/ND  MSK:   Normal tone and bulk, no LEE  Neuro:  Grossly intact  Data Reviewed: Basic Metabolic Panel:  Recent Labs Lab 09/25/14 1246 09/26/14 0510  NA 127* 130*  K 4.5 4.5  CL 96* 98*  CO2 22 23  GLUCOSE 114* 149*  BUN 30* 28*  CREATININE 1.94* 1.89*  CALCIUM 8.4* 8.0*   Liver Function Tests:  Recent Labs Lab 09/25/14 1246  AST 23  ALT 15  ALKPHOS 81  BILITOT 2.0*  PROT 7.3  ALBUMIN 3.4*    Recent Labs Lab 09/25/14 1246  LIPASE 27   No results for input(s): AMMONIA in the last 168 hours. CBC:  Recent Labs Lab 09/25/14 1246 09/26/14 0510  WBC 12.8* 10.2  NEUTROABS 9.8*  --   HGB 8.5* 7.5*  HCT 25.8* 22.9*  MCV 91.5 92.7  PLT 214 218   Cardiac Enzymes: No results for input(s): CKTOTAL, CKMB, CKMBINDEX, TROPONINI in the last 168 hours. BNP (last 3 results)  Recent Labs  04/04/14 1039  BNP 661.3*    ProBNP (last 3 results) No results for input(s): PROBNP in the last 8760 hours.  CBG:  Recent Labs Lab 09/25/14 2151 09/26/14 0742 09/26/14 1127  GLUCAP 154* 127* 131*    Recent Results (from the past 240 hour(s))  Urine culture     Status: None (Preliminary result)   Collection Time: 09/25/14  4:00 PM  Result Value Ref Range Status   Specimen Description URINE, CLEAN CATCH  Final   Special Requests NONE  Final   Culture   Final    CULTURE REINCUBATED FOR BETTER GROWTH Performed at [facility]    Report Status PENDING  Incomplete     Studies: Dg Chest 2 View  09/25/2014   CLINICAL DATA:  Fever, generalized body aches, emesis, loss of appetite for 1 week, history CHF, diabetes mellitus, former smoker, hypertension, breast cancer, Wegner's granulomatosis  EXAM: CHEST  2 VIEW  COMPARISON:  04/04/2014  FINDINGS: Enlargement of cardiac silhouette.  Calcified tortuous thoracic aorta.  Pulmonary vascular congestion.   New LEFT upper lobe opacity likely representing pneumonia.  Remaining lungs grossly clear.  No pleural effusion or pneumothorax.  Bones demineralized.  IMPRESSION: Enlargement of cardiac silhouette with pulmonary vascular congestion.  New LEFT upper lobe opacity likely representing pneumonia; followup PA and lateral chest X-ray is recommended in 3-4 weeks following trial of antibiotic therapy to ensure resolution and exclude underlying malignancy.   Electronically Signed   By: Ulyses Southward M.D.   On: 09/25/2014 16:41    Scheduled Meds: . amLODipine  5 mg Oral Daily  . apixaban  2.5 mg Oral BID  . azithromycin (ZITHROMAX) 500 MG IVPB  500 mg Intravenous Once  . azithromycin  500 mg Oral Q24H  . cefTRIAXone (ROCEPHIN)  IV  1 g Intravenous Q24H  . furosemide  20 mg Oral Daily  . gabapentin  100 mg  Oral BID AC  . insulin aspart  0-15 Units Subcutaneous TID WC  . insulin aspart  0-5 Units Subcutaneous QHS  . ipratropium-albuterol  3 mL Nebulization BID  . irbesartan  150 mg Oral Daily  . levothyroxine  112 mcg Oral QAC breakfast  . loratadine  10 mg Oral Q48H  . nebivolol  10 mg Oral Daily  . predniSONE  60 mg Oral Q breakfast  . rosuvastatin  5 mg Oral Daily  . sodium chloride  3 mL Intravenous Q12H   Continuous Infusions: . sodium chloride 100 mL/hr at 09/26/14 0307    Principal Problem:   CAP (community acquired pneumonia) Active Problems:   Hypothyroidism   Dehydration   Wegener's granulomatosis (granulomatosis with polyangiitis)   CKD (chronic kidney disease) stage 3, GFR 30-59 ml/min   Type 2 diabetes mellitus, controlled   AKI (acute kidney injury)   Hereditary and idiopathic peripheral neuropathy   Dependent edema   Acute kidney injury   UTI (lower urinary tract infection)    Time spent: 30 min    Yolanda Huffstetler  [facility] Pager [phone]. If 7PM-7AM, please contact night-coverage at [email or web address], password [facility] 09/26/2014, 12:26 PM  LOS: 1 day

## 2014-09-27 DIAGNOSIS — J189 Pneumonia, unspecified organism: Principal | ICD-10-CM

## 2014-09-27 DIAGNOSIS — E119 Type 2 diabetes mellitus without complications: Secondary | ICD-10-CM

## 2014-09-27 DIAGNOSIS — E860 Dehydration: Secondary | ICD-10-CM

## 2014-09-27 DIAGNOSIS — M3130 Wegener's granulomatosis without renal involvement: Secondary | ICD-10-CM

## 2014-09-27 DIAGNOSIS — N183 Chronic kidney disease, stage 3 (moderate): Secondary | ICD-10-CM

## 2014-09-27 LAB — URINE CULTURE

## 2014-09-27 LAB — CBC
HEMATOCRIT: 23.4 % — AB (ref 36.0–46.0)
HEMOGLOBIN: 7.7 g/dL — AB (ref 12.0–15.0)
MCH: 30.3 pg (ref 26.0–34.0)
MCHC: 32.9 g/dL (ref 30.0–36.0)
MCV: 92.1 fL (ref 78.0–100.0)
Platelets: 221 10*3/uL (ref 150–400)
RBC: 2.54 MIL/uL — ABNORMAL LOW (ref 3.87–5.11)
RDW: 13.1 % (ref 11.5–15.5)
WBC: 7 10*3/uL (ref 4.0–10.5)

## 2014-09-27 LAB — BASIC METABOLIC PANEL
Anion gap: 8 (ref 5–15)
BUN: 34 mg/dL — ABNORMAL HIGH (ref 6–20)
CO2: 22 mmol/L (ref 22–32)
Calcium: 8.3 mg/dL — ABNORMAL LOW (ref 8.9–10.3)
Chloride: 99 mmol/L — ABNORMAL LOW (ref 101–111)
Creatinine, Ser: 1.76 mg/dL — ABNORMAL HIGH (ref 0.44–1.00)
GFR, EST AFRICAN AMERICAN: 30 mL/min — AB (ref >60)
GFR, EST NON AFRICAN AMERICAN: 26 mL/min — AB (ref >60)
Glucose, Bld: 244 mg/dL — ABNORMAL HIGH (ref 65–99)
Potassium: 5.1 mmol/L (ref 3.5–5.1)
SODIUM: 129 mmol/L — AB (ref 135–145)

## 2014-09-27 LAB — IRON AND TIBC
Iron: 27 ug/dL — ABNORMAL LOW (ref 28–170)
Saturation Ratios: 11 % (ref 10.4–31.8)
TIBC: 242 ug/dL — ABNORMAL LOW (ref 250–450)
UIBC: 215 ug/dL

## 2014-09-27 LAB — FERRITIN: FERRITIN: 291 ng/mL (ref 11–307)

## 2014-09-27 LAB — VITAMIN B12: Vitamin B-12: 508 pg/mL (ref 180–914)

## 2014-09-27 LAB — GLUCOSE, CAPILLARY
GLUCOSE-CAPILLARY: 204 mg/dL — AB (ref 65–99)
Glucose-Capillary: 228 mg/dL — ABNORMAL HIGH (ref 65–99)

## 2014-09-27 LAB — FOLATE: FOLATE: 17.1 ng/mL (ref >5.9)

## 2014-09-27 MED ORDER — PREDNISONE 5 MG PO TABS: 5.0000 mg | ORAL_TABLET | Freq: Every day | ORAL | Status: DC

## 2014-09-27 MED ORDER — CEFUROXIME AXETIL 250 MG PO TABS: 250.0000 mg | ORAL_TABLET | Freq: Two times a day (BID) | ORAL | Status: DC

## 2014-09-27 MED ORDER — AZITHROMYCIN 250 MG PO TABS: ORAL_TABLET | ORAL | Status: DC

## 2014-09-27 NOTE — Care Management Note (Signed)
Case Management Note  Patient Details  Name: NANDITA MATHENIA MRN: [id number] Date of Birth: [birth year]  Subjective/Objective:                    Action/Plan:   Expected Discharge Date:   (unknown)               Expected Discharge Plan:  Home/Self Care  In-House Referral:     Discharge planning Services  CM Consult  Post Acute Care Choice:    Choice offered to:     DME Arranged:    DME Agency:     HH Arranged:    [place]:     Status of Service:     Medicare Important Message Given:  Yes Date Medicare IM Given:  09/27/14 Medicare IM give by:  Freddi Starr, RN Date Additional Medicare IM Given:    Additional Medicare Important Message give by:     If discussed at [place] of Stay Meetings, dates discussed:    Additional CommentsPurcell Mouton, RN 09/27/2014, 4:00 PM

## 2014-09-27 NOTE — Discharge Summary (Signed)
Physician Discharge Summary  [name] [name]:[id number] DOB: [birth year] DOA: 09/25/2014  PCP: Kristian Covey, MD  Admit date: 09/25/2014 Discharge date: 09/27/2014  Time spent: 20 minutes  Recommendations for Outpatient Follow-up:  Follow up with PCP in 1 week  Discharge Diagnoses:  Principal Problem:   CAP (community acquired pneumonia) Active Problems:   Hypothyroidism   Dehydration   Wegener's granulomatosis (granulomatosis with polyangiitis)   CKD (chronic kidney disease) stage 3, GFR 30-59 ml/min   Type 2 diabetes mellitus, controlled   AKI (acute kidney injury)   Hereditary and idiopathic peripheral neuropathy   Dependent edema   Acute kidney injury   UTI (lower urinary tract infection)   Discharge Condition: Improved  Diet recommendation: Diabetic  Filed Weights   09/26/14 0542  Weight: 89.812 kg (198 lb)    History of present illness:  Please see admit h and p from 6/20 for details. Briefly, pt presented with sob, found to have CAP. Pt was admitted for further work up.  Hospital Course:  Acute hypoxic respiratory failure secondary to community-acquired pneumonia versus Wegener's granulomatosis. Her chest x-ray demonstrated left upper lobe pneumonia. She had an elevated d-dimer which was likely secondary to her pneumonia. She is not a candidate for CT anterior chest secondary to her acute on chronic kidney disease. VQ scan would be unhelpful because of her underlying lung disease with superimposed pneumonia. She is already on chronic anticoagulation. - Continued ceftriaxone and azithromycin, to complete outpatient course with ceftin and azithromycin - Blood cultures no growth to date - Unable to obtain sputum culture yet - Flu negative - Strep pneumo negative - Legionella pending - HIV pending - Continue prednisone increased to 60 mg daily to be tapered as an outpatient - Continue duonebs prn  Probable UTI with TNTC WBC - F/u urine culture -  continue abx per above  Diabetes mellitus type 2 with peripheral neuropathy hemoglobin A1c of 6.8 on 07/2014 - Anticipate rise in CBG on prednisone  - On SSI coverage while inpatient  Probable chronic diastolic heart failure suggested by LVH on echocardiogram, moderate mitral valve regurgitation with dilated left atrium. He also has a moderately dilated right ventricle with reduced systolic function and a severely dilated right atrium associated with moderate tricuspid valve regurgitation.  - Continued lasix  Chronic Paroxysmal atrial fibrillation, rate controlled - CHADs2vasc = 5  - HASBLED = 3  - Continued apixaban  Essential hypertension/hyperlipidemia, blood pressure stable, continue Norvasc, valsartan, and by systolic and rosuvastatin  Acute on chronic kidney disease stage IV, baseline creatinine 1.4 due to diabetes and high blood pressure - Minimize nephrotoxins and renally dose medications  Hypothyroidism, stable, continued Synthroid  Diabetic neuropathy, stable, continued Neurontin   Anxiety, stable, continued Valium   Normocytic anemia - Iron studies, B12, folate - TSH - Occult stool - Repeat hgb in AM  Hyponatremia, chronic and stable, due to Wegener's and heart failure   Consultations:  none  Discharge Exam: Filed Vitals:   09/26/14 0542 09/26/14 1356 09/26/14 2124 09/27/14 0630  BP: 114/61 116/66 137/77 128/60  Pulse: 78 71 77 67  Temp: 98.3 F (36.8 C) 97.8 F (36.6 C) 97.8 F (36.6 C) 97.8 F (36.6 C)  TempSrc: Oral Oral Oral Oral  Resp: 20 18 18 18   Height: 5\' 6"  (1.676 m)     Weight: 89.812 kg (198 lb)     SpO2: 98% 98% 97% 98%    General: awake, in nad Cardiovascular: regular, s1, s2 Respiratory: normal resp effort,  no wheezing  Discharge Instructions     Medication List    TAKE these medications        amLODipine 5 MG tablet  Commonly known as:  NORVASC  TAKE ONE TABLET BY MOUTH ONCE DAILY     apixaban 5 MG Tabs  tablet  Commonly known as:  ELIQUIS  Take 1 tablet (5 mg total) by mouth 2 (two) times daily.     azithromycin 250 MG tablet  Commonly known as:  ZITHROMAX  500mg  po qday x 6 days, zero refills     BYSTOLIC 10 MG tablet  Generic drug:  nebivolol  TAKE ONE TABLET BY MOUTH ONCE DAILY AFTER BREAKFAST     CALCIUM CITRATE PO  Take 1,200 mg by mouth daily.     cefUROXime 250 MG tablet  Commonly known as:  CEFTIN  Take 1 tablet (250 mg total) by mouth 2 (two) times daily with a meal.     cholecalciferol 1000 UNITS tablet  Commonly known as:  VITAMIN D  Take 3,000 Units by mouth daily.     diazepam 5 MG tablet  Commonly known as:  VALIUM  TAKE ONE TABLET BY MOUTH EVERY 12 HOURS AS NEEDED FOR ANXIETY     furosemide 20 MG tablet  Commonly known as:  LASIX  TAKE ONE TABLET BY MOUTH ONCE DAILY     gabapentin 100 MG capsule  Commonly known as:  NEURONTIN  Take 1 capsule (100 mg total) by mouth 2 (two) times daily before a meal.     glimepiride 2 MG tablet  Commonly known as:  AMARYL  TAKE ONE TABLET BY MOUTH ONCE DAILY BEFORE  BREAKFAST.     levocetirizine 5 MG tablet  Commonly known as:  XYZAL  Take 1 tablet (5 mg total) by mouth every evening.     levothyroxine 112 MCG tablet  Commonly known as:  SYNTHROID, LEVOTHROID  TAKE ONE TABLET BY MOUTH ONCE DAILY BEFORE BREAKFAST     loratadine 10 MG tablet  Commonly known as:  CLARITIN  Take 10 mg by mouth daily as needed for allergies.     predniSONE 5 MG tablet  Commonly known as:  DELTASONE  Alternate 1 tablet (5mg ) with 2 tablets (10mg ) every other day     predniSONE 5 MG tablet  Commonly known as:  DELTASONE  Take 1 tablet (5 mg total) by mouth daily with breakfast.     rosuvastatin 5 MG tablet  Commonly known as:  CRESTOR  Take 1 tablet (5 mg total) by mouth daily.     valsartan 160 MG tablet  Commonly known as:  DIOVAN  TAKE ONE TABLET BY MOUTH ONCE DAILY       Allergies  Allergen Reactions  . Codeine  Sulfate Other (See Comments)    REACTION: GI upset  . Sulfonamide Derivatives Other (See Comments)    REACTION: GI upset  . Vancomycin Other (See Comments)    Red man syndrome  . Atarax [Hydroxyzine] Nausea Only and Rash   Follow-up Information    Follow up with Kristian Covey, MD. Go on 10/13/2014.   Specialty:  Family Medicine   Why:  at 10:30am   For Post Hospitalization Follow Up   Contact information:   [address] [facility] [place] [address] [phone]        The results of significant diagnostics from this hospitalization (including imaging, microbiology, ancillary and laboratory) are listed below for reference.    Significant Diagnostic Studies: Dg Chest  2 View  09/25/2014   CLINICAL DATA:  Fever, generalized body aches, emesis, loss of appetite for 1 week, history CHF, diabetes mellitus, former smoker, hypertension, breast cancer, Wegner's granulomatosis  EXAM: CHEST  2 VIEW  COMPARISON:  04/04/2014  FINDINGS: Enlargement of cardiac silhouette.  Calcified tortuous thoracic aorta.  Pulmonary vascular congestion.  New LEFT upper lobe opacity likely representing pneumonia.  Remaining lungs grossly clear.  No pleural effusion or pneumothorax.  Bones demineralized.  IMPRESSION: Enlargement of cardiac silhouette with pulmonary vascular congestion.  New LEFT upper lobe opacity likely representing pneumonia; followup PA and lateral chest X-ray is recommended in 3-4 weeks following trial of antibiotic therapy to ensure resolution and exclude underlying malignancy.   Electronically Signed   By: Ulyses Southward M.D.   On: 09/25/2014 16:41    Microbiology: Recent Results (from the past 240 hour(s))  Urine culture     Status: None   Collection Time: 09/25/14  4:00 PM  Result Value Ref Range Status   Specimen Description URINE, CLEAN CATCH  Final   Special Requests NONE  Final   Culture   Final    MULTIPLE SPECIES PRESENT, SUGGEST RECOLLECTION IF CLINICALLY INDICATED Performed at  [facility]    Report Status 09/27/2014 FINAL  Final  Culture, blood (routine x 2) Call MD if unable to obtain prior to antibiotics being given     Status: None (Preliminary result)   Collection Time: 09/25/14  8:30 PM  Result Value Ref Range Status   Specimen Description BLOOD RIGHT HAND  Final   Special Requests BOTTLES DRAWN AEROBIC ONLY 10CC  Final   Culture   Final    NO GROWTH 2 DAYS Performed at [facility]    Report Status PENDING  Incomplete  Culture, blood (routine x 2) Call MD if unable to obtain prior to antibiotics being given     Status: None (Preliminary result)   Collection Time: 09/25/14  8:40 PM  Result Value Ref Range Status   Specimen Description BLOOD RIGHT ARM  Final   Special Requests BOTTLES DRAWN AEROBIC AND ANAEROBIC 10CC  Final   Culture   Final    NO GROWTH 2 DAYS Performed at [facility]    Report Status PENDING  Incomplete     Labs: Basic Metabolic Panel:  Recent Labs Lab 09/25/14 1246 09/26/14 0510 09/27/14 0449  NA 127* 130* 129*  K 4.5 4.5 5.1  CL 96* 98* 99*  CO2 22 23 22   GLUCOSE 114* 149* 244*  BUN 30* 28* 34*  CREATININE 1.94* 1.89* 1.76*  CALCIUM 8.4* 8.0* 8.3*   Liver Function Tests:  Recent Labs Lab 09/25/14 1246  AST 23  ALT 15  ALKPHOS 81  BILITOT 2.0*  PROT 7.3  ALBUMIN 3.4*    Recent Labs Lab 09/25/14 1246  LIPASE 27   No results for input(s): AMMONIA in the last 168 hours. CBC:  Recent Labs Lab 09/25/14 1246 09/26/14 0510 09/27/14 0449  WBC 12.8* 10.2 7.0  NEUTROABS 9.8*  --   --   HGB 8.5* 7.5* 7.7*  HCT 25.8* 22.9* 23.4*  MCV 91.5 92.7 92.1  PLT 214 218 221   Cardiac Enzymes: No results for input(s): CKTOTAL, CKMB, CKMBINDEX, TROPONINI in the last 168 hours. BNP: BNP (last 3 results)  Recent Labs  04/04/14 1039  BNP 661.3*    ProBNP (last 3 results) No results for input(s): PROBNP in the last 8760 hours.  CBG:  Recent Labs Lab 09/26/14  1127 09/26/14 1718  09/26/14 2127 09/27/14 0722 09/27/14 1121  GLUCAP 131* 296* 422* 204* 228*   Signed:  Eliu Batch K  [facility] 09/27/2014, 8:06 PM

## 2014-09-30 LAB — CULTURE, BLOOD (ROUTINE X 2)
Culture: NO GROWTH
Culture: NO GROWTH

## 2014-10-10 DIAGNOSIS — H25813 Combined forms of age-related cataract, bilateral: Secondary | ICD-10-CM | POA: Diagnosis not present

## 2014-10-10 DIAGNOSIS — E119 Type 2 diabetes mellitus without complications: Secondary | ICD-10-CM | POA: Diagnosis not present

## 2014-10-12 ENCOUNTER — Encounter: Payer: Self-pay | Admitting: Internal Medicine

## 2014-10-13 ENCOUNTER — Ambulatory Visit (INDEPENDENT_AMBULATORY_CARE_PROVIDER_SITE_OTHER): Payer: Medicare Other | Admitting: Family Medicine

## 2014-10-13 ENCOUNTER — Encounter: Payer: Self-pay | Admitting: Family Medicine

## 2014-10-13 VITALS — BP 140/80 | HR 73 | Temp 98.0°F | Wt 194.0 lb

## 2014-10-13 DIAGNOSIS — I10 Essential (primary) hypertension: Secondary | ICD-10-CM | POA: Diagnosis not present

## 2014-10-13 DIAGNOSIS — J189 Pneumonia, unspecified organism: Secondary | ICD-10-CM

## 2014-10-13 DIAGNOSIS — E1121 Type 2 diabetes mellitus with diabetic nephropathy: Secondary | ICD-10-CM

## 2014-10-13 NOTE — Progress Notes (Signed)
Pre visit review using our clinic review tool, if applicable. No additional management support is needed unless otherwise documented below in the visit note.

## 2014-10-13 NOTE — Progress Notes (Signed)
Subjective:    Patient ID: [name] [name], female    DOB: 10-Jul-1932, 81 y.o.   MRN: [id number]  HPI  Patient seen for hospital follow-up. She was seen here on 09/25/2014 with fever, cough and progressive dyspnea. She has underlying Wegener's granulomatosis. She was dehydrated and we recommended hospital evaluation. She was admitted with chest x-ray showing left upper lobe pneumonia. Urine culture and blood culture negative. Elevated d-dimer probably secondary to pneumonia. She already takes chronic anticoagulant and no VQ scan was done. Patient had multiple other tests including strep pneumonia screen negative, Legionella screen negative, HIV negative. Her prednisone was increased and she has now been tapered back to her usual dosage. She was treated outpatient with Ceftin and Zithromax after initial IV medications.  She feels well and back to baseline at this time. Blood sugars have been relatively stable. Denies any cough or dyspnea at rest.  Past Medical History  Diagnosis Date  . HYPOTHYROIDISM 07/24/2008  . HYPERLIPIDEMIA 07/24/2008  . DEPRESSION 05/16/2009  . HYPERTENSION 07/24/2008  . Vertigo   . Diabetes mellitus type II   . Arthritis     r tkr  . CVA (cerebral vascular accident) 2008    mini stroke.no residual  . Heart murmur     stress test 2009.dr peter [place]  . Intermittent vertigo   . Skin abnormality     facial lesions .pt applying mupiracin to areas  . Atrial fibrillation   . Breast cancer   . Pneumonia   . 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 Knee surgery  2009    TKR  . Cholecystectomy  1980  . Joint replacement      r knee  . Breast surgery  2007    Lumpectomy, XRT 2006.l breast  . Video assisted thoracoscopy  04/22/2011    Procedure: VIDEO ASSISTED THORACOSCOPY;  Surgeon: Melrose Nakayama, MD;  Location: [place];  Service: Thoracic;  Laterality: Left;  WITH BIOPSY  . Heel spur surgery Right   .  Exploratory laparotomy      reports that she quit smoking about 32 years ago. Her smoking use included Cigarettes. She has a 6 pack-year smoking history. She has never used smokeless tobacco. She reports that she does not drink alcohol or use illicit drugs. family history includes Arthritis in her mother; Breast cancer in her sister; Cancer in her sister; Coronary artery disease in her brother and father; Heart disease in her father; Lung cancer in her brother; Rheum arthritis in her mother. Allergies  Allergen Reactions  . Codeine Sulfate Other (See Comments)    REACTION: GI upset  . Sulfonamide Derivatives Other (See Comments)    REACTION: GI upset  . Vancomycin Other (See Comments)    Red man syndrome  . Atarax [Hydroxyzine] Nausea Only and Rash     Review of Systems  Constitutional: Negative for fever and chills.  HENT: Negative for congestion and trouble swallowing.   Respiratory: Negative for cough, shortness of breath and wheezing.   Cardiovascular: Negative for chest pain, palpitations and leg swelling.  Gastrointestinal: Negative for abdominal pain.  Genitourinary: Negative for dysuria.  Neurological: Negative for dizziness.  Psychiatric/Behavioral: Negative for confusion.       Objective:   Physical Exam  Constitutional: She appears well-developed and well-nourished.  HENT:  Mouth/Throat: Oropharynx is clear and moist.  Neck: Neck supple. No JVD present. No thyromegaly present.  Cardiovascular: Normal rate and regular rhythm.  Pulmonary/Chest: Effort normal and breath sounds normal. No respiratory distress. She has no wheezes. She has no rales.  Musculoskeletal: She exhibits no edema.  Neurological: She is alert.          Assessment & Plan:  #1 recent community-acquired pneumonia left upper lobe- clinically improved. Recommendation per radiology for follow-up chest x-ray in 3-4 weeks from initial and we will defer to pulmonary whom she sees for follow-up  soon. #2 type 2 diabetes. History of good control. A1c 6.8% in April. See back in 3 months repeat A1c then. Continue close monitoring #3 history of atrial fibrillation. Patient on chronic Eliquis. #4 hypertension stable

## 2014-10-13 NOTE — Patient Instructions (Signed)
Follow up promptly for any fever or increasing shortness of breath.

## 2014-10-18 ENCOUNTER — Other Ambulatory Visit: Payer: Self-pay | Admitting: Family Medicine

## 2014-10-19 ENCOUNTER — Telehealth: Payer: Self-pay | Admitting: Family Medicine

## 2014-10-19 NOTE — Telephone Encounter (Signed)
Dr Helen Hashimoto you approve Rx for Gabapentin as this was not prescribed by you?  Refills on Glimepiride and Furosemide were sent to pts pharmacy.

## 2014-10-19 NOTE — Telephone Encounter (Signed)
Pt says she need refills on her Glimipride and gabapentin please. [facility] states she has no rx and no refills. Also wants to know if she is to continue taking her lasix, if so she needs a refill.

## 2014-10-19 NOTE — Telephone Encounter (Signed)
May refill Gabapentin for 6 months.

## 2014-10-20 MED ORDER — GABAPENTIN 100 MG PO CAPS
100.0000 mg | ORAL_CAPSULE | Freq: Two times a day (BID) | ORAL | Status: DC
Start: 2014-10-20 — End: 2015-05-29

## 2014-10-20 NOTE — Telephone Encounter (Signed)
I called the pt and informed her the Rx for Gabapentin was sent to her pharmacy today and the Rx for Furosemide was sent yesterday, refill for Glimepiride was sent on 6/20 with 5 refills.  She stated she will contact the pharmacy and call back if needed.

## 2014-10-30 ENCOUNTER — Other Ambulatory Visit: Payer: Self-pay | Admitting: Family Medicine

## 2014-11-06 ENCOUNTER — Other Ambulatory Visit: Payer: Self-pay | Admitting: Family Medicine

## 2014-11-08 DIAGNOSIS — H25811 Combined forms of age-related cataract, right eye: Secondary | ICD-10-CM | POA: Diagnosis not present

## 2014-11-13 ENCOUNTER — Other Ambulatory Visit: Payer: Self-pay | Admitting: Cardiology

## 2014-11-13 ENCOUNTER — Other Ambulatory Visit: Payer: Self-pay | Admitting: *Deleted

## 2014-11-14 ENCOUNTER — Other Ambulatory Visit: Payer: Self-pay | Admitting: Pharmacist Clinician (PhC)/ Clinical Pharmacy Specialist

## 2014-11-14 ENCOUNTER — Other Ambulatory Visit: Payer: Self-pay | Admitting: *Deleted

## 2014-11-14 MED ORDER — APIXABAN 2.5 MG PO TABS: 2.5000 mg | ORAL_TABLET | Freq: Two times a day (BID) | ORAL | Status: DC

## 2014-11-20 ENCOUNTER — Ambulatory Visit: Payer: Medicare Other | Admitting: Family Medicine

## 2014-11-20 DIAGNOSIS — H2511 Age-related nuclear cataract, right eye: Secondary | ICD-10-CM | POA: Diagnosis not present

## 2014-11-20 DIAGNOSIS — H25811 Combined forms of age-related cataract, right eye: Secondary | ICD-10-CM | POA: Diagnosis not present

## 2014-11-22 ENCOUNTER — Ambulatory Visit: Payer: Medicare Other | Admitting: Cardiology

## 2014-11-27 ENCOUNTER — Encounter: Payer: Self-pay | Admitting: Cardiology

## 2014-11-27 ENCOUNTER — Ambulatory Visit (INDEPENDENT_AMBULATORY_CARE_PROVIDER_SITE_OTHER): Payer: Medicare Other | Admitting: Cardiology

## 2014-11-27 VITALS — BP 120/68 | HR 58 | Ht 66.0 in | Wt 201.0 lb

## 2014-11-27 DIAGNOSIS — I5032 Chronic diastolic (congestive) heart failure: Secondary | ICD-10-CM | POA: Diagnosis not present

## 2014-11-27 DIAGNOSIS — I482 Chronic atrial fibrillation, unspecified: Secondary | ICD-10-CM

## 2014-11-27 DIAGNOSIS — I10 Essential (primary) hypertension: Secondary | ICD-10-CM

## 2014-11-27 DIAGNOSIS — E785 Hyperlipidemia, unspecified: Secondary | ICD-10-CM

## 2014-11-27 NOTE — Patient Instructions (Signed)
Continue your current therapy  I will see you in 6 months.   

## 2014-11-27 NOTE — Progress Notes (Signed)
[facility]. [address], [address], [place]  [address] Phone: [phone] Fax:  [phone]  Date:  11/27/2014   ID:  [name] [name], DOB [birth year], MRN [id number]  PCP:  [name], MD    History of Present Illness: [name] [name] is a 81 y.o. female with a hx of HTN, DM2, prior TIA, HL, breast CA, atrial fibrillation.  She is on Eliquis (CHADS2-VASc=6) for anticoagulation. She has a  dx of granulomatosis with polyangiitis (Wegener's).   Echo 04/14/13  EF 60-65%, trivial AI, mild-mod MR, mod to severe LAE, mild RVE, mod RAE, moderate TR,  PASP- 60 mm Hg.     Since her last visit she was hospitalized in June with LUL PNA.  Breathing is back to baseline. She is still wearing oxygen at night.   She is taking lasix 20 mg daily. She notes some edema when her legs are dependent for long periods of time but the swelling resolves at night.  She avoids salt. She was [facility] by [facility] for her ventral hernia and conservative therapy recommended.   Wt Readings from Last 3 Encounters:  11/27/14 91.173 kg (201 lb)  10/13/14 87.998 kg (194 lb)  09/26/14 89.812 kg (198 lb)     Past Medical History  Diagnosis Date  . HYPOTHYROIDISM 07/24/2008  . HYPERLIPIDEMIA 07/24/2008  . DEPRESSION 05/16/2009  . HYPERTENSION 07/24/2008  . Vertigo   . Diabetes mellitus type II   . Arthritis     r tkr  . CVA (cerebral vascular accident) 2008    mini stroke.no residual  . Heart murmur     stress test 2009.dr peter [place]  . Intermittent vertigo   . Skin abnormality     facial lesions .pt applying mupiracin to areas  . Atrial fibrillation   . Breast cancer   . Pneumonia   . Wegener's disease, pulmonary 10/2012    Current Outpatient Prescriptions  Medication Sig Dispense Refill  . amLODipine (NORVASC) 5 MG tablet TAKE ONE TABLET BY MOUTH ONCE DAILY 30 tablet 5  . apixaban (ELIQUIS) 2.5 MG TABS tablet Take 1 tablet (2.5 mg total) by mouth 2 (two) times daily. 180 tablet 0  . BYSTOLIC 10 MG tablet TAKE  ONE TABLET BY MOUTH ONCE DAILY AFTER BREAKFAST 30 tablet 5  . CALCIUM CITRATE PO Take 1,200 mg by mouth daily.      . cholecalciferol (VITAMIN D) 1000 UNITS tablet Take 3,000 Units by mouth daily.      . diazepam (VALIUM) 5 MG tablet TAKE ONE TABLET BY MOUTH EVERY 12 HOURS AS NEEDED FOR ANXIETY 30 tablet 0  . furosemide (LASIX) 20 MG tablet TAKE ONE TABLET BY MOUTH ONCE DAILY 90 tablet 0  . gabapentin (NEURONTIN) 100 MG capsule Take 1 capsule (100 mg total) by mouth 2 (two) times daily before a meal. 60 capsule 5  . glimepiride (AMARYL) 2 MG tablet TAKE ONE TABLET BY MOUTH ONCE DAILY BEFORE  BREAKFAST. 30 tablet 5  . levothyroxine (SYNTHROID, LEVOTHROID) 112 MCG tablet TAKE ONE TABLET BY MOUTH ONCE DAILY BEFORE  BREAKFAST 90 tablet 1  . loratadine (CLARITIN) 10 MG tablet Take 10 mg by mouth daily as needed for allergies.    . predniSONE (DELTASONE) 5 MG tablet Alternate 1 tablet (68m) with 2 tablets (135m every other day 50 tablet 5  . rosuvastatin (CRESTOR) 5 MG tablet Take 1 tablet (5 mg total) by mouth daily. 90 tablet 3  . valsartan (DIOVAN) 160 MG tablet TAKE ONE TABLET  BY MOUTH ONCE DAILY. 30 tablet 5   No current facility-administered medications for this visit.    Allergies:    Allergies  Allergen Reactions  . Codeine Sulfate Other (See Comments)    REACTION: GI upset  . Sulfonamide Derivatives Other (See Comments)    REACTION: GI upset  . Vancomycin Other (See Comments)    Red man syndrome  . Atarax [Hydroxyzine] Nausea Only and Rash    Social History:  The patient  reports that she quit smoking about 32 years ago. Her smoking use included Cigarettes. She has a 6 pack-year smoking history. She has never used smokeless tobacco. She reports that she does not drink alcohol or use illicit drugs.   ROS:  Please see the history of present illness.   No hemoptysis.   All other systems reviewed and negative.   PHYSICAL EXAM: VS:  BP 120/68 mmHg  Pulse 58  Ht _0  (1.676 m)  Wt  91.173 kg (201 lb)  BMI 32.46 kg/m2 Well nourished, well developed, in no acute distress. She is seen in a wheelchair. HEENT: normal Neck: no JVD or bruits Cardiac:  normal S1, S2; irregularly irregular rhythm; no murmur or gallop Lungs:  Decreased breath sounds bilaterally. Scant crackles in bases. Abd: soft, nontender Ext: 1+ ankle edema Skin: warm and dry Neuro:  CNs 2-12 intact, no focal abnormalities noted  Laboratory data:  Lab Results  Component Value Date   WBC 7.0 09/27/2014   HGB 7.7* 09/27/2014   HCT 23.4* 09/27/2014   PLT 221 09/27/2014   GLUCOSE 244* 09/27/2014   CHOL 135 01/16/2014   TRIG 114.0 01/16/2014   HDL 47.80 01/16/2014   LDLDIRECT 75.3 10/21/2011   LDLCALC 64 01/16/2014   ALT 15 09/25/2014   AST 23 09/25/2014   NA 129* 09/27/2014   K 5.1 09/27/2014   CL 99* 09/27/2014   CREATININE 1.76* 09/27/2014   BUN 34* 09/27/2014   CO2 22 09/27/2014   TSH 0.71 05/17/2014   INR 1.70* 08/18/2013   HGBA1C 6.8* 07/19/2014   MICROALBUR 1.7 08/15/2009     ASSESSMENT AND PLAN:  1. Atrial Fibrillation:  Rate controlled.  No  hemoptysis.  She is on the correct dose of Eliquis for her (age > 23 and weight > 60 kg).  CHADS2-VASc=6.   2. Pulmonary HTN:  Likely related to sequelae of Wegener's granulomatosis. 3. Hypertension:  BP well controlled.  Continue to monitor. 4. Hyperlipidemia:  Continue statin. 5. Granulomatosis with Polyangiitis (Wegener's):  Continue follow up with pulmonary and oncology as planned  6. LUL PNA-resolved. 7. Chronic diastolic dysfunction. Well compensated

## 2014-11-28 ENCOUNTER — Ambulatory Visit: Payer: Medicare Other | Admitting: Family Medicine

## 2014-11-29 ENCOUNTER — Ambulatory Visit: Payer: Medicare Other | Admitting: Family Medicine

## 2014-12-11 ENCOUNTER — Other Ambulatory Visit: Payer: Self-pay | Admitting: Family Medicine

## 2014-12-12 NOTE — Telephone Encounter (Signed)
Refill once but would only use sparingly for severe vertigo.

## 2014-12-12 NOTE — Telephone Encounter (Signed)
Last visit 10/13/14 Last refill 06/12/14 #30 0 refill

## 2014-12-15 ENCOUNTER — Other Ambulatory Visit: Payer: Self-pay | Admitting: Family Medicine

## 2014-12-25 ENCOUNTER — Other Ambulatory Visit: Payer: Self-pay | Admitting: Family Medicine

## 2015-01-18 ENCOUNTER — Ambulatory Visit: Payer: Medicare Other | Admitting: Family Medicine

## 2015-01-22 ENCOUNTER — Telehealth: Payer: Self-pay | Admitting: Cardiology

## 2015-01-22 NOTE — Telephone Encounter (Signed)
Returned call to patient Eliquis 2.5 mg samples left at front desk for pick up.

## 2015-01-22 NOTE — Telephone Encounter (Signed)
Pt says she needs some samples of Eliquis please.

## 2015-01-24 ENCOUNTER — Encounter: Payer: Self-pay | Admitting: Family Medicine

## 2015-01-24 ENCOUNTER — Ambulatory Visit (INDEPENDENT_AMBULATORY_CARE_PROVIDER_SITE_OTHER): Payer: Medicare Other | Admitting: Family Medicine

## 2015-01-24 VITALS — BP 138/80 | HR 70 | Temp 97.8°F | Wt 205.7 lb

## 2015-01-24 DIAGNOSIS — N183 Chronic kidney disease, stage 3 unspecified: Secondary | ICD-10-CM

## 2015-01-24 DIAGNOSIS — Z23 Encounter for immunization: Secondary | ICD-10-CM

## 2015-01-24 DIAGNOSIS — E119 Type 2 diabetes mellitus without complications: Secondary | ICD-10-CM

## 2015-01-24 DIAGNOSIS — E785 Hyperlipidemia, unspecified: Secondary | ICD-10-CM

## 2015-01-24 DIAGNOSIS — I482 Chronic atrial fibrillation, unspecified: Secondary | ICD-10-CM

## 2015-01-24 LAB — HEPATIC FUNCTION PANEL
ALK PHOS: 51 U/L (ref 39–117)
ALT: 9 U/L (ref 0–35)
AST: 20 U/L (ref 0–37)
Albumin: 4 g/dL (ref 3.5–5.2)
Bilirubin, Direct: 0.1 mg/dL (ref 0.0–0.3)
Total Bilirubin: 0.7 mg/dL (ref 0.2–1.2)
Total Protein: 6.9 g/dL (ref 6.0–8.3)

## 2015-01-24 LAB — BASIC METABOLIC PANEL
BUN: 22 mg/dL (ref 6–23)
CALCIUM: 9.2 mg/dL (ref 8.4–10.5)
CO2: 28 mEq/L (ref 19–32)
Chloride: 92 mEq/L — ABNORMAL LOW (ref 96–112)
Creatinine, Ser: 1.55 mg/dL — ABNORMAL HIGH (ref 0.40–1.20)
GFR: 34.16 mL/min — AB (ref >60.00)
GLUCOSE: 108 mg/dL — AB (ref 70–99)
POTASSIUM: 4.4 meq/L (ref 3.5–5.1)
SODIUM: 129 meq/L — AB (ref 135–145)

## 2015-01-24 LAB — LIPID PANEL
CHOLESTEROL: 187 mg/dL (ref 0–200)
HDL: 73.8 mg/dL (ref >39.00)
LDL Cholesterol: 87 mg/dL (ref 0–99)
NonHDL: 112.91
Total CHOL/HDL Ratio: 3
Triglycerides: 132 mg/dL (ref 0.0–149.0)
VLDL: 26.4 mg/dL (ref 0.0–40.0)

## 2015-01-24 LAB — HEMOGLOBIN A1C: Hgb A1c MFr Bld: 6.8 % — ABNORMAL HIGH (ref 4.6–6.5)

## 2015-01-24 NOTE — Progress Notes (Signed)
Subjective:    Patient ID: [name] [name], female    DOB: Jan 06, 1933, 81 y.o.   MRN: [id number]  HPI Medical follow-up. Multiple chronic problems including history of obesity, hypothyroidism, type 2 diabetes, Wegener's granulomatosis, chronic kidney disease, atrial fibrillation, COPD, chronic diastolic heart failure, hypertension.  Type 2 diabetes. History of good control. Last A1c 6.8%. Recent fasting blood sugars ranging usually 80-90.  Hypertension treated with multiple drug regimen. Recently stable. She has some chronic leg edema which is relatively stable. Her weights have been stable. She remains on low-dose furosemide 20 mg once daily  Hypothyroidism. Remains on levothyroxin. Labs were stable last February. Hyperlipidemia treated with Crestor. Due for repeat lipid panel.  Still needs flu vaccine.  No history of Prevnar 13 on record.  Past Medical History  Diagnosis Date  . HYPOTHYROIDISM 07/24/2008  . HYPERLIPIDEMIA 07/24/2008  . DEPRESSION 05/16/2009  . HYPERTENSION 07/24/2008  . Vertigo   . Diabetes mellitus type II   . Arthritis     r tkr  . CVA (cerebral vascular accident) ([place]) 2008    mini stroke.no residual  . Heart murmur     stress test 2009.dr peter Martin[place]  . Intermittent vertigo   . Skin abnormality     facial lesions .pt applying mupiracin to areas  . Atrial fibrillation ([place])   . Breast cancer ([place])   . Pneumonia   . Wegener's disease, pulmonary ([place]) 10/2012   Past Surgical History  Procedure Laterality Date  . Knee surgery  2009    TKR  . Cholecystectomy  1980  . Joint replacement      r knee  . Breast surgery  2007    Lumpectomy, XRT 2006.l breast  . Video assisted thoracoscopy  04/22/2011    Procedure: VIDEO ASSISTED THORACOSCOPY;  Surgeon: Melrose Nakayama, MD;  Location: [facility];  Service: Thoracic;  Laterality: Left;  WITH BIOPSY  . Heel spur surgery Right   . Exploratory laparotomy      reports that she quit smoking about 32 years ago. Her  smoking use included Cigarettes. She has a 6 pack-year smoking history. She has never used smokeless tobacco. She reports that she does not drink alcohol or use illicit drugs. family history includes Arthritis in her mother; Breast cancer in her sister; Cancer in her sister; Coronary artery disease in her brother and father; Heart disease in her father; Lung cancer in her brother; Rheum arthritis in her mother. Allergies  Allergen Reactions  . Codeine Sulfate Other (See Comments)    REACTION: GI upset  . Sulfonamide Derivatives Other (See Comments)    REACTION: GI upset  . Vancomycin Other (See Comments)    Red man syndrome  . Atarax [Hydroxyzine] Nausea Only and Rash      Review of Systems  Constitutional: Positive for fatigue.  Eyes: Negative for visual disturbance.  Respiratory: Negative for cough, chest tightness, shortness of breath and wheezing.   Cardiovascular: Positive for leg swelling. Negative for chest pain and palpitations.  Endocrine: Negative for polydipsia and polyuria.  Genitourinary: Negative for dysuria.  Neurological: Negative for dizziness, seizures, syncope, weakness, light-headedness and headaches.       Objective:   Physical Exam  Constitutional: She appears well-developed and well-nourished.  Neck: Neck supple. No JVD present.  Cardiovascular: Normal rate.   Irregular rhythm  Pulmonary/Chest: Effort normal.  She has some faint crackles in both bases which are chronic  Musculoskeletal: She exhibits edema.  Trace to 1+ pitting edema both  legs          Assessment & Plan:  #1 type 2 diabetes. History of good control. No recent hypoglycemia. Recheck A1c #2 hypertension stable #3 chronic kidney disease. Recheck basic metabolic panel #4 hyperlipidemia. Check lipid and hepatic panel #5 hypothyroidism. Recheck TSH in about 6 months #6 chronic atrial fibrillation. Continue Eliquis 2.5 mg twice daily #7 health maintenance. Flu vaccine given. Prevnar 13  given.

## 2015-01-24 NOTE — Progress Notes (Signed)
Pre visit review using our clinic review tool, if applicable. No additional management support is needed unless otherwise documented below in the visit note.

## 2015-01-24 NOTE — Addendum Note (Signed)
Addended by: Ailene Rud E on: 01/24/2015 10:36 AM   Modules accepted: Orders

## 2015-01-25 ENCOUNTER — Telehealth: Payer: Self-pay | Admitting: Family Medicine

## 2015-01-25 NOTE — Telephone Encounter (Signed)
Patient Name: [name] [name] DOB: 25-Apr-1932 Initial Comment caller states she recv'd a flu shot and a pneumonia shot - her arm and shoulder are hurting her Nurse Assessment Nurse: Vallery Sa, RN, [place] Date/Time (Eastern Time): 01/25/2015 8:58:06 AM Confirm and document reason for call. If symptomatic, describe symptoms. ---Caller states she developed pain in her right upper arm yesterday after having her Flu and Pneumonia shots. No fever. No severe breathing or swallowing difficulty. Has the patient traveled out of the country within the last 30 days? ---No Does the patient have any new or worsening symptoms? ---Yes Will a triage be completed? ---Yes Related visit to physician within the last 2 weeks? ---Yes Does the PT have any chronic conditions? (i.e. diabetes, asthma, etc.) ---Yes List chronic conditions. ---Wagner's, Diabetes, A-Fib Guidelines Guideline Title Affirmed Question Affirmed Notes Immunization Reactions Influenza (TIV; Injection) injected vaccine reactions (all triage questions negative) Final Disposition User [place], RN, T[place] Disagree/Comply: Comply

## 2015-01-25 NOTE — Telephone Encounter (Signed)
Spoke with patient and "it is a little better".

## 2015-02-06 ENCOUNTER — Other Ambulatory Visit: Payer: Self-pay | Admitting: Family Medicine

## 2015-02-07 ENCOUNTER — Encounter (HOSPITAL_COMMUNITY): Payer: Self-pay | Admitting: Emergency Medicine

## 2015-02-07 ENCOUNTER — Emergency Department (HOSPITAL_COMMUNITY)
Admission: EM | Admit: 2015-02-07 | Discharge: 2015-02-07 | Disposition: A | Discharge status: Home / Self Care | Payer: Medicare Other | Attending: Emergency Medicine | Admitting: Emergency Medicine

## 2015-02-07 DIAGNOSIS — Z7902 Long term (current) use of antithrombotics/antiplatelets: Secondary | ICD-10-CM | POA: Diagnosis not present

## 2015-02-07 DIAGNOSIS — Z87891 Personal history of nicotine dependence: Secondary | ICD-10-CM | POA: Insufficient documentation

## 2015-02-07 DIAGNOSIS — Z79899 Other long term (current) drug therapy: Secondary | ICD-10-CM | POA: Insufficient documentation

## 2015-02-07 DIAGNOSIS — I4891 Unspecified atrial fibrillation: Secondary | ICD-10-CM | POA: Insufficient documentation

## 2015-02-07 DIAGNOSIS — E039 Hypothyroidism, unspecified: Secondary | ICD-10-CM | POA: Insufficient documentation

## 2015-02-07 DIAGNOSIS — R030 Elevated blood-pressure reading, without diagnosis of hypertension: Secondary | ICD-10-CM | POA: Diagnosis not present

## 2015-02-07 DIAGNOSIS — Z96651 Presence of right artificial knee joint: Secondary | ICD-10-CM | POA: Diagnosis not present

## 2015-02-07 DIAGNOSIS — R011 Cardiac murmur, unspecified: Secondary | ICD-10-CM | POA: Diagnosis not present

## 2015-02-07 DIAGNOSIS — E785 Hyperlipidemia, unspecified: Secondary | ICD-10-CM | POA: Insufficient documentation

## 2015-02-07 DIAGNOSIS — I10 Essential (primary) hypertension: Secondary | ICD-10-CM | POA: Insufficient documentation

## 2015-02-07 DIAGNOSIS — Z8673 Personal history of transient ischemic attack (TIA), and cerebral infarction without residual deficits: Secondary | ICD-10-CM | POA: Insufficient documentation

## 2015-02-07 DIAGNOSIS — Z8701 Personal history of pneumonia (recurrent): Secondary | ICD-10-CM | POA: Insufficient documentation

## 2015-02-07 DIAGNOSIS — F419 Anxiety disorder, unspecified: Secondary | ICD-10-CM | POA: Diagnosis not present

## 2015-02-07 DIAGNOSIS — E119 Type 2 diabetes mellitus without complications: Secondary | ICD-10-CM | POA: Insufficient documentation

## 2015-02-07 DIAGNOSIS — M1711 Unilateral primary osteoarthritis, right knee: Secondary | ICD-10-CM | POA: Insufficient documentation

## 2015-02-07 DIAGNOSIS — Z853 Personal history of malignant neoplasm of breast: Secondary | ICD-10-CM | POA: Insufficient documentation

## 2015-02-07 MED ORDER — AMLODIPINE BESYLATE 5 MG PO TABS
5.0000 mg | ORAL_TABLET | Freq: Once | ORAL | Status: AC
  Administered 2015-02-07: 5 mg via ORAL
  Filled 2015-02-07: qty 1

## 2015-02-07 NOTE — ED Notes (Signed)
Pt states "I think I got away with myself and forgot to take my amlopdipine, I normally take this med at night with my valsartan for my heart. Was told by triage nurse on phone to not take medication just in case she had already.

## 2015-02-07 NOTE — ED Provider Notes (Signed)
CSN: [id number]     Arrival date & time 02/07/15  0050 History   First MD Initiated Contact with Patient 02/07/15 0058     Chief Complaint  Patient presents with  . Hypertension     (Consider location/radiation/quality/duration/timing/severity/associated sxs/prior Treatment) HPI Comments: Patient is an 81 year old female who presents from home via EMS with a complaint of hypertension that started prior to arrival. Patient was watching TV and became unsure if she took her amlodipine. She became worried and started checking her BP and called the on call doctor line. Patient's BP was noted to be 111'B systolic at that time. Nurse on the line instructed her to call EMS and go to the ED. On EMS arrival, patient's BP noted to be 520 systolic. She is anxious but has no other complaints at this time.    Past Medical History  Diagnosis Date  . HYPOTHYROIDISM 07/24/2008  . HYPERLIPIDEMIA 07/24/2008  . DEPRESSION 05/16/2009  . HYPERTENSION 07/24/2008  . Vertigo   . Diabetes mellitus type II   . Arthritis     r tkr  . CVA (cerebral vascular accident) ([place]) 2008    mini stroke.no residual  . Heart murmur     stress test 2009.dr peter Martin[place]  . Intermittent vertigo   . Skin abnormality     facial lesions .pt applying mupiracin to areas  . Atrial fibrillation ([place])   . Breast cancer ([place])   . Pneumonia   . Wegener's disease, pulmonary ([place]) 10/2012   Past Surgical History  Procedure Laterality Date  . Knee surgery  2009    TKR  . Cholecystectomy  1980  . Joint replacement      r knee  . Breast surgery  2007    Lumpectomy, XRT 2006.l breast  . Video assisted thoracoscopy  04/22/2011    Procedure: VIDEO ASSISTED THORACOSCOPY;  Surgeon: Melrose Nakayama, MD;  Location: [facility];  Service: Thoracic;  Laterality: Left;  WITH BIOPSY  . Heel spur surgery Right   . Exploratory laparotomy     Family History  Problem Relation Age of Onset  . Arthritis Mother   . Rheum arthritis Mother   . Heart  disease Father   . Coronary artery disease Father   . Cancer Sister     breast CA, both sisters  . Breast cancer Sister   . Coronary artery disease Brother   . Lung cancer Brother    Social History  Substance Use Topics  . Smoking status: Former Smoker -- 0.50 packs/day for 12 years    Types: Cigarettes    Quit date: 06/04/1982  . Smokeless tobacco: Never Used  . Alcohol Use: No   OB History    No data available     Review of Systems  All other systems reviewed and are negative.     Allergies  Codeine sulfate; Sulfonamide derivatives; Vancomycin; and Atarax  Home Medications   Prior to Admission medications   Medication Sig Start Date End Date Taking? Authorizing Provider  amLODipine (NORVASC) 5 MG tablet TAKE ONE TABLET BY MOUTH ONCE DAILY 08/03/14   [name], MD  apixaban (ELIQUIS) 2.5 MG TABS tablet Take 1 tablet (2.5 mg total) by mouth 2 (two) times daily. 11/14/14   Peter M Martinique, MD  BYSTOLIC 10 MG tablet TAKE ONE TABLET BY MOUTH ONCE DAILY AFTER BREAKFAST 08/14/14   [name], MD  CALCIUM CITRATE PO Take 1,200 mg by mouth daily.  Historical Provider, MD  cholecalciferol (VITAMIN D) 1000 UNITS tablet Take 3,000 Units by mouth daily.      Historical Provider, MD  diazepam (VALIUM) 5 MG tablet TAKE ONE TABLET BY MOUTH EVERY 12 HOURS AS NEEDED FOR ANXIETY 12/12/14   [name], MD  furosemide (LASIX) 20 MG tablet TAKE ONE TABLET BY MOUTH ONCE DAILY 12/25/14   [name], MD  gabapentin (NEURONTIN) 100 MG capsule Take 1 capsule (100 mg total) by mouth 2 (two) times daily before a meal. 10/20/14   [name], MD  glimepiride (AMARYL) 2 MG tablet TAKE ONE TABLET BY MOUTH ONCE DAILY BEFORE  BREAKFAST. 09/25/14   [name], MD  levothyroxine (SYNTHROID, LEVOTHROID) 112 MCG tablet TAKE ONE TABLET BY MOUTH ONCE DAILY BEFORE  BREAKFAST 10/30/14   [name], MD  loratadine (CLARITIN) 10 MG tablet Take 10 mg by mouth daily as needed  for allergies.    Historical Provider, MD  predniSONE (DELTASONE) 5 MG tablet Alternate 1 tablet (57m) with 2 tablets (130m every other day 08/24/14   ClDeneise LeverMD  rosuvastatin (CRESTOR) 5 MG tablet Take 1 tablet (5 mg total) by mouth daily. 01/03/14   BrEulas PostMD  valsartan (DIOVAN) 160 MG tablet TAKE ONE TABLET BY MOUTH ONCE DAILY. 11/07/14   BrEulas PostMD   BP 199/92 mmHg  Pulse 69  Temp(Src) 98 F (36.7 C)  Resp 24  Ht _0  (1.676 m)  Wt 200 lb (90.719 kg)  BMI 32.30 kg/m2  SpO2 94% Physical Exam  Constitutional: She is oriented to person, place, and time. She appears well-developed and well-nourished. No distress.  HENT:  Head: Normocephalic and atraumatic.  Eyes: Conjunctivae and EOM are normal.  Neck: Normal range of motion.  Cardiovascular: Normal rate and regular rhythm.  Exam reveals no gallop and no friction rub.   No murmur heard. Pulmonary/Chest: Effort normal and breath sounds normal. She has no wheezes. She has no rales. She exhibits no tenderness.  Abdominal: Soft. She exhibits no distension. There is no tenderness. There is no rebound.  Musculoskeletal: Normal range of motion.  Neurological: She is alert and oriented to person, place, and time. Coordination normal.  Speech is goal-oriented. Moves limbs without ataxia.   Skin: Skin is warm and dry.  Psychiatric: She has a normal mood and affect. Her behavior is normal.  Nursing note and vitals reviewed.   ED Course  Procedures (including critical care time) Labs Review Labs Reviewed - No data to display  Imaging Review No results found. I have personally reviewed and evaluated these images and lab results as part of my medical decision-making.   EKG Interpretation None      MDM   Final diagnoses:  Essential hypertension    1:15 AM Patient will have her medication here and be observed. Patient has no complaints at this time. Vitals stable and patient afebrile.   Patient's  BP trending down to 166/80. Patient will be discharged.   KaAlvina ChouPA-C 02/07/15 [id number] 02/07/15 [id number]

## 2015-02-07 NOTE — ED Notes (Signed)
Pt is from home and transported via [facility]. Pt is uncertain if she took her BP medication while she watching television. Pt got worried, took her BP medication (160/80ish). Called the on-call doctor line, informed to wait a while. Pt waited, called back the nurse to report her BP was in 967'S systolic. When EMS arrived, she was anxious, crying, upset, BP was 897 systolic. In route, the BP was 156/73, has a hx of A-fib and currently in A-fib, rate in the 80's.

## 2015-02-07 NOTE — ED Notes (Signed)
Bed: RV20 Expected date:  Expected time:  Means of arrival:  Comments: EMS 81 yo female from home, hypertension, could not remember if she took her BP meds tonight, hx atrial fib

## 2015-02-07 NOTE — ED Notes (Signed)
Restricted extremity on left side (hx of stroke) and fall risk bracelet placed on pt.

## 2015-02-08 ENCOUNTER — Telehealth: Payer: Self-pay | Admitting: *Deleted

## 2015-02-08 NOTE — Telephone Encounter (Signed)
Pt went to ED.   PLEASE NOTE: All timestamps contained within this report are represented as [place] Standard Time. CONFIDENTIALTY NOTICE: This fax transmission is intended only for the addressee. It contains information that is legally privileged, confidential or otherwise protected from use or disclosure. If you are not the intended recipient, you are strictly prohibited from reviewing, disclosing, copying using or disseminating any of this information or taking any action in reliance on or regarding this information. If you have received this fax in error, please notify us immediately by telephone so that we can arrange for its return to [place]. Phone: [phone], Toll-Free: [phone], Fax: [phone] Page: 1 of 1 Call Id: [id number] [facility] - Client [place] Patient Name: [name] [name] Gender: Female DOB: 08-23-1932 Age: 81 Y 80 M 11 D Return Phone Number: [phone] (Primary) Address: City/State/Zip: [place] Client [facility] - Client Client Site [facility] - Night Physician Carolann Littler Contact Type Call Call Type Triage / Clinical Relationship To Patient Self Return Phone Number [phone] (Primary) Chief Complaint Blood Pressure High Initial Comment Caller states she is calling nurse back because her blood pressure rose to 167/92 Nurse Assessment Guidelines Guideline Title Affirmed Question Affirmed Notes Nurse Date/Time ([place] Time) Disp. Time Eilene Ghazi Time) Disposition Final User 02/06/2015 11:50:48 PM Send To RN Personal Tamala Julian, Nancie 02/07/2015 12:01:16 AM Clinical Call Bishop, RN, April 02/07/2015 12:04:48 AM Clinical Call Yes Bishop, RN, April After Care Instructions Given Call Event Type User Date / Time Description Comments User: April, Bishop, RN Date/Time Eilene Ghazi Time): 02/07/2015 12:08:22 AM after multiple attempts to try to reach the  patient back, finally reached her and she reported that her BP is now at 175/ 100. and she states EMS is there.

## 2015-02-12 ENCOUNTER — Other Ambulatory Visit: Payer: Self-pay | Admitting: Family Medicine

## 2015-03-08 ENCOUNTER — Other Ambulatory Visit: Payer: Self-pay | Admitting: Family Medicine

## 2015-03-19 ENCOUNTER — Other Ambulatory Visit: Payer: Self-pay | Admitting: Family Medicine

## 2015-03-21 ENCOUNTER — Ambulatory Visit (INDEPENDENT_AMBULATORY_CARE_PROVIDER_SITE_OTHER): Payer: Medicare Other | Admitting: Internal Medicine

## 2015-03-21 ENCOUNTER — Encounter: Payer: Self-pay | Admitting: Internal Medicine

## 2015-03-21 VITALS — BP 122/72 | HR 75 | Ht 66.0 in | Wt 205.0 lb

## 2015-03-21 DIAGNOSIS — M3130 Wegener's granulomatosis without renal involvement: Secondary | ICD-10-CM

## 2015-03-21 DIAGNOSIS — J410 Simple chronic bronchitis: Secondary | ICD-10-CM

## 2015-03-21 DIAGNOSIS — J9621 Acute and chronic respiratory failure with hypoxia: Secondary | ICD-10-CM | POA: Diagnosis not present

## 2015-03-21 DIAGNOSIS — I272 Other secondary pulmonary hypertension: Secondary | ICD-10-CM

## 2015-03-21 DIAGNOSIS — J9611 Chronic respiratory failure with hypoxia: Secondary | ICD-10-CM | POA: Diagnosis not present

## 2015-03-21 DIAGNOSIS — C7A029 Malignant carcinoid tumor of the large intestine, unspecified portion: Secondary | ICD-10-CM

## 2015-03-21 NOTE — Patient Instructions (Signed)
Order- schedule ONOX on room air      Chronic respiratory failure with hypoxia  Ok to reduce prednisone to 5 mg daily  Please call if we can help

## 2015-03-21 NOTE — Progress Notes (Signed)
04/03/11- 81 year old female former smoking history referred courtesy of Dr. Elease Hashimoto with concern of abnormal chest CT. Husband is an allergy patient here. Dr. Erick Blinks recent office note reviewed "Followup from recent pneumonia. Patient had presented here with extreme myalgias and chronic pain along with bilateral shoulder hip pain. Very high sedimentation rate of 120.  She almost total resolution of myalgias with low dose prednisone strongly suggesting polymyalgia rheumatica. She subsequently presented with cough with trace of blood but no dyspnea, fever, reported a pain. Chest x-ray showed bilateral infiltrates suggesting probable bilateral pneumonia the recommendation for CT scan. CT scan revealed no adenopathy and no evidence for a lung mass. Compatible with bilateral pneumonia. Patient placed on antibiotic and much better at this time with no fever and essentially no cough. Her muscle and joint pains are almost 100% resolved with low-dose prednisone. She did present back in September with severe sinus disease suggesting acute sinusitis. CT revealed no acute findings. She denies any arthralgias at this time. No skin rash. No fever." She has been on prednisone 10 mg twice daily since December 13 and feels significantly better. Morning cough has cleared with residual trace phlegm and trace blood. No chest pain. CT chest 03/23/2011 -images reviewed by me-bilateral patchy airspace disease with air bronchograms. No cavitation or definite nodularity seen. She has had right total knee replacement but no generalized arthritis. Childhood exposure to an uncle with tuberculosis. Her PPD skin test was negative. She denies history of lung disease, GERD, DVT, kidney or liver disease. Lab- C-ANCA POS 1:320, P-ANCA NEG. RA 1:36, ANA NEG12 year old female former smoking history referred courtesy of Dr. Elease Hashimoto with concern of abnormal chest CT. Husband is an allergy patient here.  05/13/11- 89 yoF former smoker  followed for bilateral pulmonary infiltrates, incr C-ANCA, s/p VATS bx/ organizing pneumonia, Metastatic intestinal carcinoid. Marland Kitchen Hx breast Ca 2006/ Dr Truddie Coco Husband here. She returns now after left lung biopsy. She has expected left thoracotomy pain but is otherwise stable as she continues maintenance prednisone. Denies blood, fever, swollen glands, discolored sputum. Exertional dyspnea is not worse, allowing for the surgery. I shared with her and her husband the available initial pathology: Patchy organizing pneumonia with chronic interstitial pneumonia and focal atypical glands. I reviewed the discussion about her at thoracic oncology conference. The parenchymal lung process is not a vasculitis as I originally questioned. I told her there was an incidental finding of cancer cells with further identification pending but that this was a separate process. We agreed to refer her to Dr. Truddie Coco who managed her breast cancer. After she had left, personal communication from Dr. Jimmy Footman- neuroendocrine tumor staining consistent with metastatic intestinal carcinoid.  06/20/11  13 yoF former smoker followed for bilateral pulmonary infiltrates, incr C-ANCA, s/p VATS bx/ organizing pneumonia, Metastatic intestinal carcinoid. Marland Kitchen Hx breast Ca 2006/ Dr Truddie Coco   Husband here She says her breathing is now "fine" back to her normal. She denies fever, cough, sputum. I spoke with Mrs. Herald myself and had to explain the discovery of carcinoid after her lung biopsy. We also have the note from the [facility] indicating they discussed it with her. She has absolutely no recollection. Little cough or shortness of breath now. Still a little posterior thoracotomy incision pain. CT 05/19/11- images reviewed with them- IMPRESSION:  1. Marked improvement in bilateral air space disease seen on  comparison CT of 03/24/2011. Near complete resolution.  2. Postoperative change in the left hemithorax consistent with  recent VATS.   Original Report  Authenticated By: Suzy Bouchard, M.D.   11/07/11- 57 yoF former smoker followed for bilateral pulmonary infiltrates, incr C-ANCA, s/p VATS bx/ organizing pneumonia, Metastatic intestinal carcinoid. Marland Kitchen Hx breast Ca 2006/ Dr Truddie Coco    c/o  Chronic vertigo, difficulty breathing and gets tired easy. Also some wheezing, post nasal,  and cough.  Some days she feels much better than others, no real pattern. Occasional light cough to clear throat. We had called in prednisone, which did help. Ambulatory around home, but gets out little. CT chest 09/09/11- I reviewed w/ her- there is significant patchy bilateral lower zone fibrotic scarring and some bronchial thickening. IMPRESSION:  1. No evidence of thoracic metastatic disease or other active  process.  2. Small mild cardiomegaly and hiatal hernia.  Original Report Authenticated By: Marlaine Hind, M.D.    02/09/12-  27 yoF former smoker followed for bilateral pulmonary infiltrates, incr C-ANCA, s/p VATS bx/ cryptogenic organizing pneumonia/ BOOP, Metastatic intestinal carcinoid. Marland Kitchen Hx breast Ca 2006/ Dr Truddie Coco  January had biopsy to ck for other lung condition. SOB and wheezing at times. Left sided pain around the  Back. Continues Advair 250. Due for followup chest CT in December for Dr. Jacquiline Doe. Not on prednisone. She says breathing is unchanged. Denies pain, blood or swollen nodes.   05/11/12-  89 yoF former smoker followed for bilateral pulmonary infiltrates, incr C-ANCA, s/p VATS bx/ cryptogenic organizing pneumonia/ BOOP, Metastatic intestinal carcinoid. Complicated by Hx breast Ca 2006/ Dr Truddie Coco. AFib/ Dr Martinique, DM. She says her husband is developing dementia. Deep breath makes her left flank feel tight where she had thoracentesis. Complains of itching since starting Eliquis.Atarax 50 mg was too strong and Benadryl did not help. Itching keeps her awake all night. No visible rash. She is convinced really unpleasant itching began when  she started Eliquis. I explained this medication needs to be transitioned carefully to anything else and Dr. Martinique would need to be the one to manage that. CT chest 03/26/12 . IMPRESSION:  1. No findings to strongly suggest metastatic disease to the  thorax.  2. There is a new focus of nodular ground-glass attenuation  measuring 1 cm in the anterior aspect of the left upper lobe which  is highly nonspecific. Initial follow-up by chest CT without  contrast is recommended in 3 months to confirm persistence. This  recommendation follows the consensus statement: Recommendations for  the Management of Subsolid Pulmonary Nodules Detected at CT: A  Statement from the [employer] Society as published in Radiology  2013; 266:304-317.  3. Atherosclerosis, including left main and three-vessel coronary  artery disease. Assessment for potential risk factor  modification, dietary therapy or pharmacologic therapy may be  warranted, if clinically indicated.  4. Slight increase in the small amount of pericardial fluid  compared to the prior examination. This is unlikely to be of  hemodynamic significance at this time.  5. Moderate cardiomegaly.  6. Additional incidental findings, similar to prior studies, as  discussed above.  Original Report Authenticated By: Vinnie Langton, M.D.   08/08/12- 15 yoF former smoker followed for bilateral pulmonary infiltrates/ nodules, incr C-ANCA, s/p VATS bx/ cryptogenic organizing pneumonia/ BOOP, Metastatic intestinal carcinoid. Complicated by Hx breast Ca 2006/ Dr Truddie Coco. AFib/ Dr Martinique, DM. Itching stopped, while continuing Eliquis. She had changed detergents and fabric softener, but cause unknown. Seasonal rhinitis symptoms now. Total IgE 05/11/2012 was less than 1.5. New burning itch, intermittent, right lateral ribs x2 weeks. Has had shingles shot. No visible rash. CT chest  08/02/12 IMPRESSION:  Interval persistence but decrease in size of the ground-glass   nodules seen in the anterior left upper lobe. Adenocarcinoma  cannot be excluded. Followup by CT is recommended in 12 months,  with continued annual surveillance for a minimum of 3 years.  These recommendations are taken from "Recommendations for the  Management of Subsolid Pulmonary Nodules Detected at CT: A  Statement from the [employer]" Radiology 2013; 266:1, 304-  317.  Original Report Authenticated By: Misty Stanley, M.D.   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 Relapse since 2013 11/02/12 PR-3 880. MPO negative. - GBM neg Likely - capillaritis. 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 Admitted with hemoptysis  hemoptysis with CT scan chest findings of alveolar hemorrhage  As of June 2014 the oncologist did not feel there was any evidence of breast cancer recurrence.CT scan of the chest ruled out pulmonary embolism but showed severe bilateral groundglass opacities consistent with alveolar hemorrhage pattern. Pulmonary consultation was called. She was initially treated by stopping eliquis, providing empiric antibiotics and supported with diuresis.  Pulmonary  felt The clinical picture was felt to be consistent with diffuse alveolar hemorrhage resulting in hemoptysis. Most likely cause of alveolar hemorrhage was felt to be autoimmune based on past history of autoimmune antibody positivity and lung biopsy showing organizing pneumonia in December 2012/January 2013. Prior C.-ANCA positive though no evidence of vasculitis in Jan 2013 lung bx which is puzzling. Alternative is relapsing BOOP but the hemoptysis would be unsual unless precipitated by Eliquis. On 11/02/12 PR-3 880. Nephrology consult  On  7/29 immunosuppression therapy was initiated. She was given Pulse methylprednisolone, for three days, and her first dose of Rituxan was given on 7/30. The following days we saw significant clinical improvement in regards to hemoptysis, pulmonary infiltrates and dyspnea, She was started on  Rituxan over cyclophosphamide due to malignancy history, and also this is technically a "relapse" or A "flare". Dose starting 11/03/12 will be Induction therapy with rituximab (375 mg/m2 per week for four weeks). W/  PCP prophylaxis w/ - Bactrim 1 DS on M, W, F  Since discharge she is feeling better with resolution of hemoptysis, . Dyspnea is less and she is starting to get some of her energy back.  No fever , orthopnea or edema.  Had labs done last week. , hbg stable at 8.9 , renal fx tr down at 1.6 (max scr 2.4) . Ov with oncology last week. Rituxan  infusion given.   12/03/12- 85 yoF former smoker followed for bilateral pulmonary infiltrates/ nodules, incr C-ANCA, s/p VATS bx/ Granulomatous Polyangiitis (Wegener's) vs cryptogenic organizing pneumonia/ BOOP, Metastatic intestinal carcinoid. Complicated by Hx breast Ca 2006/ Dr Truddie Coco. AFib/ Dr Martinique, DM. Started Rituxan 375 mg/m2x 4 weeks 11/03/12. PCP prophy Bactrim 1DS on M,W,F. FOLLOWS FOR: reports doing well since ov w/ TP.  still doing well on Dulera, but needs a refill.  still taking prednisone 9m daily. 1 dose of Rituxan still pending. Had to have transfusion last week for hemoglobin 7.7. No hemoptysis since a little bit at first recognition of her granulomatous disease. She has continued prednisone 60 mg daily since hospital discharge, and we have discussed tapering the dose. No coughing, no chest pain or fever.  01/17/13- 57 yoF former smoker followed for bilateral pulmonary infiltrates/ nodules, incr C-ANCA, s/p VATS bx/ Granulomatous Polyangiitis (Wegener's) vs cryptogenic organizing pneumonia/ BOOP, Metastatic intestinal carcinoid. Complicated by Hx breast  Ca 2006/ Dr Truddie Coco. AFib/ Dr Martinique, DM. FOLLOWS FOR:  Breathing has improved since last OV needs refill on dulera Had flu vaccine Paces herself. Feels she is getting stronger. Has not needed Dulera in 2 or 3 weeks.  04/22/14- 50 yoF former smoker followed for bilateral pulmonary infiltrates/ nodules, incr C-ANCA, s/p VATS bx/ Granulomatous Polyangiitis (Wegener's) vs cryptogenic organizing pneumonia/ BOOP, Metastatic intestinal carcinoid. Complicated by Hx breast Ca 2006/ Dr Truddie Coco. AFib/ Dr Martinique, DM. FOLLOWS FOR: recently in hospital; bronchitis Hospital 1/7-1/12/15- acute bronchitis/respiratory failure/probably viral. There may have been fluid overload, she feels better now on daily Lasix. Prednisone now at 1/2x20 mg daily. CT chest 04/14/13 IMPRESSION:  1. Stable cardiac enlargement and coronary artery calcifications.  2. Patchy ground-glass opacity in both lungs with vague nodularity.  Findings suggest a partial airspace filling process such as edema,  hemorrhage or inflammatory or infectious alveolitis. No focal  airspace consolidation.  3. Possible new cystic lesion in the pancreatic body/tail junction  region. Please see above discussion.  Electronically Signed  By: Kalman Jewels M.D.  On: 04/14/2013 10:56  05/27/13- 75 yoF former smoker followed for bilateral pulmonary infiltrates/ nodules, incr C-ANCA, s/p VATS bx/ Granulomatous Polyangiitis (Wegener's) vs cryptogenic organizing pneumonia/ BOOP, Metastatic intestinal carcinoid. Complicated by Hx breast Ca 2006/ Dr Truddie Coco. AFib/ Dr Martinique, DM. FOLLOWS FOR:  Breathing doing well-- no concerns today Minimal dry cough. Not needing Dulera or O2. Neb BID helps. CXR 05/09/13 1V FINDINGS:  Marked cardiopericardial enlargement. Unchanged tortuosity of the  aorta. The lungs are hyperinflated chronically. There is pulmonary  venous congestion without edema or effusion. No pneumothorax. No  asymmetric opacity.  IMPRESSION:  Pulmonary venous  congestion without edema or effusion.  Electronically Signed  By: Jorje Guild M.D.  On: 05/09/2013 05:54 ECHO 04/14/13 Study Conclusions - Left ventricle: The cavity size was normal. Wall thickness was increased in a pattern of mild LVH. Systolic function was normal. The estimated ejection fraction was in the range of 60% to 65%. - Mitral valve: Mild to moderate regurgitation. - Left atrium: The atrium was moderately to severely dilated. - Right ventricle: The cavity size was mildly to moderately dilated. Wall thickness was normal. Systolic function was mildly reduced. - Right atrium: The atrium was severely dilated. - Tricuspid valve: Moderate regurgitation. - Pulmonary arteries: PA peak pressure: 3m Hg (S). Transthoracic echocardiography. M-mode, complete 2D, spectral Doppler, and color Doppler. Height: Height: 166.4cm. Height: 65.5in. Weight: Weight: 94.3kg. Weight: 207.5lb. Body mass index: BMI: 34.1kg/m^2. Body surface area: BSA: 2.137m. Blood pressure: 145/72. Patient status: Inpatient. Location: Bedside.  07/19/13- [age]oF former smoker followed for bilateral pulmonary infiltrates/ nodules, incr C-ANCA, s/p VATS bx/ Granulomatous Polyangiitis (Wegener's) vs cryptogenic organizing pneumonia/ BOOP, Metastatic intestinal carcinoid. Complicated by Hx breast Ca 2006/ Dr RuTruddie CocoAFib/ Dr JoMartiniqueDM. FOLLOWS FOR: had to use nebulizer treatment due to sitting outside with increased pollen; feels better now and no new complaints Pollen caused cough and watery nose. Nebulizer helps with occasional use. Continues maintenance prednisone 1/2 x 20 mg daily.  [facility]  [facility] for .a post [facility] follow up .  She was admitted with community-acquired pneumonia versus acute bronchitis. Chest x-ray showed asymmetrical airspace disease superimposed on chronic lung changes. She was treated with aggressive IV antibiotics, and discharged on a course  of Levaquin .she was  discharged on oxygen therapy. Says still weak from hospital stay.  Has ov with PCP today for labs.  Finished abx earlier today. She denies any hemoptysis, orthopnea, PND, or leg Swelling .   09/07/13- 81 yoF former smoker followed for bilateral pulmonary infiltrates/ nodules, incr C-ANCA, s/p VATS bx/ Granulomatous Polyangiitis (Wegener's) vs cryptogenic organizing pneumonia/ BOOP, Metastatic intestinal carcinoid. Complicated by Hx breast Ca 2006/ Dr Truddie Coco. AFib/ PHTN Dr Martinique, DM FOLLOWS FOR: pt would like to come off O2-was d/c'd home with it and does not like it. Pt states she feels much better from 2 weeks ago. CXR 09/07/13 FINDINGS:  Underlying chronic interstitial disease is stable. There is no frank  edema or airspace consolidation currently. There has been apparent  interval clearing of subtle opacities on the right, which were felt  to represent infiltrate superimposed on chronic disease.  Heart is enlarged with pulmonary vascularity within normal limits.  No adenopathy. There is evidence of an old fracture of the left  posterior sixth rib, stable.  IMPRESSION:  Stable chronic interstitial disease and cardiomegaly. No  superimposed edema or consolidation is appreciable currently. No new  opacities appreciable compared to most recent prior study.  Electronically Signed  By: Lowella Grip M.D.  On: 09/07/2013 09:24  01/09/14- 74 yoF former smoker followed for bilateral pulmonary infiltrates/ nodules, incr C-ANCA, s/p VATS bx/ Granulomatous Polyangiitis (Wegener's) vs cryptogenic organizing pneumonia/ BOOP, Metastatic intestinal carcinoid. Complicated by Hx breast Ca 2006. AFib/ PHTN Dr Martinique, DM FOLLOWS FOR: Pt states her breathing is doing good; wonders if she can come off her O2 QHS; has not had to use her neb tx since last visit. Has not been needing her nebulizer or inhalers at all. Daily prednisone 5-10 mg She asks me about an incisional hernia near her umbilicus. Not painful.  She would like to talk to a surgeon about it.  We talked about respiratory limits for surgery and anesthesia.  07/11/14- 81 yoF former smoker followed for bilateral pulmonary infiltrates/ nodules, incr C-ANCA, s/p VATS bx/ Granulomatous Polyangiitis (Wegener's) vs cryptogenic organizing pneumonia/ BOOP, Metastatic intestinal carcinoid. Complicated by Hx breast Ca 2006. AFib/ PHTN Dr Martinique, DM     husband here FOLLOWS FOR: Pt states she is still using O2 2 sleep/ Advanced @ night. Pt states breathing has improved. Denies any SOB, cough, wheezing, and chest tightness/congestion. She would like not to need oxygen but is managing to do as instructed.- 2l w/ sleep CXR 04/04/24 IMPRESSION: Cardiomegaly, chronic. Bronchitis. No consolidation or collapse. Electronically Signed  By: Nelson Chimes M.D.  On: 04/04/2014 10:13  09/19/14- 13 yoF former smoker followed for bilateral pulmonary infiltrates/ nodules, incr C-ANCA, s/p VATS bx/ Granulomatous Polyangiitis (Wegener's) vs cryptogenic organizing pneumonia/ BOOP, Metastatic intestinal carcinoid. Complicated by Hx breast Ca 2006. AFib/ PHTN Dr Martinique, DM      FOLLOWS FOR:Had ONO done in April 2016- never rec'd call about this.Denies sob,wheezing. Occass. cough-dry.c/o had ha's and runny nose-clear. Takes Clartin.No nasal congestion,occass. Pnd. She doesn't feel she needs oxygen anymore and has tried without her 2 L/Advanced for sleep. We are calling to request report. Still are tolerating prednisone 5 mg with 10 mg every other day for Wegener's.  03/21/2015-81 year old female former smoker followed for bilateral pulmonary infiltrates/nodules, increased C-ANCA, status post VATS biopsy/granulomatous polyangiitis (Wegener's) versus cryptogenic organizing pneumonia/, metastatic intestinal carcinoid, complicated by history breast CA 2006, A. fib/PE HTN/Dr. Martinique, DM FOLLOWS FOR: Pt would like to discuss O2 usage at night; Last ONO  was 07-2014.  Says she is  not smoking. Pneumonia and flu vaccines UTD No acute changes and she says she feels fine. Continues feeling more comfortable sleeping with oxygen 2 L/Advanced. Continues prednisone 5 mg alternating with 10 mg every other day for Wegener's granulomatosis.  ROS-see HPI Constitutional:   No-   weight loss, night sweats, fevers, chills, +fatigue, lassitude. HEENT:   No-  headaches, difficulty swallowing, tooth/dental problems, sore throat,       No-  sneezing, itching, ear ache, nasal congestion, post nasal drip,  CV: No -chest pain, orthopnea, PND, swelling in lower extremities, anasarca,    No-palpitations Resp:   shortness of breath with exertion or at rest.           No- productive cough,  No-non-productive cough,               No-   change in color of mucus.  No- wheezing.   Skin: No-   rash or lesions. no- pruritus GI:  No-   heartburn, indigestion, abdominal pain, nausea, vomiting,  GU: MS:  No-   joint pain or swelling.  Neuro-     +Vertigo- uses wheelchair Psych:  No- change in mood or affect. No depression or anxiety.  No memory loss.  OBJ General- Alert, Oriented, Affect-appropriate, Distress- none acute, overweight, +wheelchair     Skin- rash-none, lesions- none, excoriation- none Lymphadenopathy- none Head- atraumatic            Eyes- Gross vision intact, PERRLA, conjunctivae -pale            Ears- Hearing, canals-normal            Nose- turbinate edema, no-Septal dev, mucus, polyps, erosion, perforation             Throat- Mallampati II-III , mucosa clear , drainage- none, tonsils- atrophic Neck- flexible , trachea midline, no stridor , thyroid nl, carotid no bruit Chest - symmetrical excursion , unlabored           Heart/CV-+ slow irregularly irregular/ AFib , 2-3/6 precordial systolic murmur , no gallop  , no rub, Nl s1 s2                 - JVD+full , edema-none, stasis changes- none, varices- none           Lung- + bilateral squeaks, no wheeze , cough+raspy ,  dullness-none, rub- none           Chest wall- healed VATS thoracotomy incision L Abd-  +R para-umbilical hernia, Br/ Gen/ Rectal- Not done, not indicated Extrem- cyanosis- none, clubbing, none, atrophy- none, strength- nl. + Wheelchair for distance/ cane at home.  Neuro- grossly intact to observation

## 2015-03-25 NOTE — Assessment & Plan Note (Signed)
Continues home oxygen for sleep

## 2015-03-25 NOTE — Assessment & Plan Note (Signed)
We will try reducing prednisone maintenance dose

## 2015-03-25 NOTE — Assessment & Plan Note (Signed)
Continues oxygen for sleep. We will update overnight oximetry on room air for qualification and reassessment. Plan-overnight oximetry on room air

## 2015-03-25 NOTE — Assessment & Plan Note (Signed)
No recurrence so far

## 2015-03-25 NOTE — Assessment & Plan Note (Signed)
Sleeps better using oxygen and we feel she continues to needed for sleep.

## 2015-03-25 NOTE — Assessment & Plan Note (Signed)
Chronic granulomatous inflammation. We would like to reduce steroids if possible. Plan-reduce prednisone to 5 mg daily

## 2015-04-05 DIAGNOSIS — R0602 Shortness of breath: Secondary | ICD-10-CM | POA: Diagnosis not present

## 2015-04-05 DIAGNOSIS — J449 Chronic obstructive pulmonary disease, unspecified: Secondary | ICD-10-CM | POA: Diagnosis not present

## 2015-04-10 ENCOUNTER — Other Ambulatory Visit: Payer: Self-pay | Admitting: Family Medicine

## 2015-04-13 ENCOUNTER — Other Ambulatory Visit: Payer: Self-pay

## 2015-04-13 DIAGNOSIS — Z1231 Encounter for screening mammogram for malignant neoplasm of breast: Secondary | ICD-10-CM

## 2015-04-17 ENCOUNTER — Telehealth: Payer: Self-pay | Admitting: Cardiology

## 2015-04-17 NOTE — Telephone Encounter (Signed)
Patient aware samples are at the front desk for pick up  

## 2015-04-17 NOTE — Telephone Encounter (Signed)
Patient calling the office for samples of medication:   1.  What medication and dosage are you requesting samples for? Eliquis 2.32m  2.  Are you currently out of this medication? Yes

## 2015-04-20 ENCOUNTER — Telehealth: Payer: Self-pay | Admitting: Internal Medicine

## 2015-04-20 NOTE — Telephone Encounter (Signed)
Pt aware of results and will continue her O2

## 2015-04-23 ENCOUNTER — Other Ambulatory Visit: Payer: Self-pay | Admitting: Family Medicine

## 2015-04-25 ENCOUNTER — Other Ambulatory Visit: Payer: Self-pay | Admitting: Internal Medicine

## 2015-04-30 ENCOUNTER — Other Ambulatory Visit: Payer: Self-pay | Admitting: Family Medicine

## 2015-05-10 ENCOUNTER — Other Ambulatory Visit: Payer: Self-pay | Admitting: Family Medicine

## 2015-05-10 ENCOUNTER — Telehealth: Payer: Self-pay | Admitting: Internal Medicine

## 2015-05-10 NOTE — Telephone Encounter (Signed)
ATC pt, line busy [facility]

## 2015-05-11 MED ORDER — PREDNISONE 5 MG PO TABS: 5.0000 mg | ORAL_TABLET | Freq: Every day | ORAL | Status: DC

## 2015-05-11 NOTE — Telephone Encounter (Signed)
Patient Instructions     Order- schedule ONOX on room air Chronic respiratory failure with hypoxia  Ok to reduce prednisone to 5 mg daily  Please call if we can help  ---   Called spoke with pt. She needs new RX for pred 5 mg tablet sent in reflecting change from December. I have done so. Nothing further needed

## 2015-05-14 ENCOUNTER — Other Ambulatory Visit: Payer: Self-pay | Admitting: Family Medicine

## 2015-05-15 ENCOUNTER — Encounter: Payer: Self-pay | Admitting: Family Medicine

## 2015-05-15 ENCOUNTER — Ambulatory Visit (INDEPENDENT_AMBULATORY_CARE_PROVIDER_SITE_OTHER): Payer: Medicare Other | Admitting: Family Medicine

## 2015-05-15 VITALS — BP 100/80 | HR 61 | Temp 97.9°F | Ht 66.0 in | Wt 207.5 lb

## 2015-05-15 DIAGNOSIS — L299 Pruritus, unspecified: Secondary | ICD-10-CM

## 2015-05-15 MED ORDER — TRIAMCINOLONE ACETONIDE 0.1 % EX CREA: 1.0000 "application " | TOPICAL_CREAM | Freq: Two times a day (BID) | CUTANEOUS | Status: DC | PRN

## 2015-05-15 NOTE — Patient Instructions (Signed)
Avoid prolonged bathing Consider anti-itch soap such as Aveeno Try to avoid scratching as much as possible. Use the Triamcinolone once or twice daily as needed. Continue with the moisturizing lotion as needed for dryness.

## 2015-05-15 NOTE — Progress Notes (Signed)
Subjective:    Patient ID: [name] [name], female    DOB: 12-04-1932, 82 y.o.   MRN: [id number]  HPI Patient seen with about one month history of itching basically from head to toe. She has not seen much rash. Most her itching is upper extremities and trunk. She has not noted any hives. She is dealing with a lot of stress in caring for her husband who has dementia. No recent change of soap or detergent. She uses [employer]. No history of food allergy. She has taken Zyrtec which does not help. She's been sleeping okay. Using moisturizing lotion without improvement. She does think stress may exacerbate her itching. No recent change of medication.  Past Medical History  Diagnosis Date  . HYPOTHYROIDISM 07/24/2008  . HYPERLIPIDEMIA 07/24/2008  . DEPRESSION 05/16/2009  . HYPERTENSION 07/24/2008  . Vertigo   . Diabetes mellitus type II   . Arthritis     r tkr  . CVA (cerebral vascular accident) ([place]) 2008    mini stroke.no residual  . Heart murmur     stress test 2009.dr peter [place]  . Intermittent vertigo   . Skin abnormality     facial lesions .pt applying mupiracin to areas  . Atrial fibrillation ([place])   . Breast cancer ([place])   . Pneumonia   . Wegener's disease, pulmonary ([place]) 10/2012   Past Surgical History  Procedure Laterality Date  . Knee surgery  2009    TKR  . Cholecystectomy  1980  . Joint replacement      r knee  . Breast surgery  2007    Lumpectomy, XRT 2006.l breast  . Video assisted thoracoscopy  04/22/2011    Procedure: VIDEO ASSISTED THORACOSCOPY;  Surgeon: Melrose Nakayama, MD;  Location: [facility];  Service: Thoracic;  Laterality: Left;  WITH BIOPSY  . Heel spur surgery Right   . Exploratory laparotomy      reports that she quit smoking about 32 years ago. Her smoking use included Cigarettes. She has a 6 pack-year smoking history. She has never used smokeless tobacco. She reports that she does not drink alcohol or use illicit drugs. family history includes Arthritis  in her mother; Breast cancer in her sister; Cancer in her sister; Coronary artery disease in her brother and father; Heart disease in her father; Lung cancer in her brother; Rheum arthritis in her mother. Allergies  Allergen Reactions  . Codeine Sulfate Other (See Comments)    REACTION: GI upset  . Sulfonamide Derivatives Other (See Comments)    REACTION: GI upset  . Vancomycin Other (See Comments)    Red man syndrome  . Atarax [Hydroxyzine] Nausea Only and Rash      Review of Systems  Constitutional: Negative for fatigue.  Eyes: Negative for visual disturbance.  Respiratory: Negative for cough, chest tightness, shortness of breath and wheezing.   Cardiovascular: Negative for chest pain, palpitations and leg swelling.  Gastrointestinal: Negative for nausea, vomiting and abdominal pain.  Skin: Positive for rash.  Neurological: Negative for dizziness, seizures, syncope, weakness, light-headedness and headaches.       Objective:   Physical Exam  Constitutional: She appears well-developed and well-nourished.  Cardiovascular: Normal rate and regular rhythm.   Pulmonary/Chest: Effort normal and breath sounds normal. No respiratory distress. She has no wheezes. She has no rales.  Skin:  Slightly dry skin upper extremities. She has a few scattered excoriations on her upper back and arms otherwise devoid of rash. No hives.  Assessment & Plan:  Pruritus. Possibly related to dry skin. She has some excoriations and slightly dry skin. We've recommended avoid scratching as much as possible. Avoid prolonged bathing. Continue moisturizers after bathing. Triamcinolone 0.1% cream to use twice daily as needed.

## 2015-05-15 NOTE — Progress Notes (Signed)
Pre visit review using our clinic review tool, if applicable. No additional management support is needed unless otherwise documented below in the visit note.

## 2015-05-29 ENCOUNTER — Other Ambulatory Visit: Payer: Self-pay | Admitting: Family Medicine

## 2015-06-12 ENCOUNTER — Ambulatory Visit (INDEPENDENT_AMBULATORY_CARE_PROVIDER_SITE_OTHER): Payer: Medicare Other | Admitting: Cardiology

## 2015-06-12 ENCOUNTER — Encounter: Payer: Self-pay | Admitting: Cardiology

## 2015-06-12 VITALS — BP 120/80 | HR 60 | Ht 66.0 in | Wt 206.0 lb

## 2015-06-12 DIAGNOSIS — I482 Chronic atrial fibrillation, unspecified: Secondary | ICD-10-CM

## 2015-06-12 DIAGNOSIS — I10 Essential (primary) hypertension: Secondary | ICD-10-CM | POA: Diagnosis not present

## 2015-06-12 NOTE — Patient Instructions (Signed)
Continue your current therapy  I will see you in 6 months.

## 2015-06-13 NOTE — Progress Notes (Signed)
[address] [place], [place]  [address] Phone: [phone] Fax:  [phone]  Date:  06/13/2015   ID:  [name] [name], DOB 10/04/1932, MRN [id number]  PCP:  Kristian Covey, MD    History of Present Illness: [name] [name] is a 82 y.o. female with a hx of HTN, DM2, prior TIA, HL, breast CA, atrial fibrillation.  She is on Eliquis (CHADS2-VASc=6) for anticoagulation. She has a  dx of granulomatosis with polyangiitis (Wegener's).   Echo 04/14/13  EF 60-65%, trivial AI, mild-mod MR, mod to severe LAE, mild RVE, mod RAE, moderate TR,  PASP- 60 mm Hg.     On follow up today she is doing well from a cardiac standpoint. She denies any chest pain, SOB, or palpitations. She is bothered by diffuse itching. She has some mild bruising. She is concerned about her husband's declining health.   Wt Readings from Last 3 Encounters:  06/12/15 93.441 kg (206 lb)  05/15/15 94.121 kg (207 lb 8 oz)  03/21/15 92.987 kg (205 lb)     Past Medical History  Diagnosis Date  . HYPOTHYROIDISM 07/24/2008  . HYPERLIPIDEMIA 07/24/2008  . DEPRESSION 05/16/2009  . HYPERTENSION 07/24/2008  . Vertigo   . Diabetes mellitus type II   . Arthritis     r tkr  . CVA (cerebral vascular accident) ([facility]) 2008    mini stroke.no residual  . Heart murmur     stress test 2009.dr Sally Reimers [place]  . Intermittent vertigo   . Skin abnormality     facial lesions .pt applying mupiracin to areas  . Atrial fibrillation ([facility])   . Breast cancer ([facility])   . Pneumonia   . Wegener's disease, pulmonary ([facility]) 10/2012    Current Outpatient Prescriptions  Medication Sig Dispense Refill  . amLODipine (NORVASC) 5 MG tablet TAKE ONE TABLET BY MOUTH ONCE DAILY 30 tablet 11  . apixaban (ELIQUIS) 2.5 MG TABS tablet Take 1 tablet (2.5 mg total) by mouth 2 (two) times daily. 180 tablet 0  . BYSTOLIC 10 MG tablet TAKE ONE TABLET BY MOUTH ONCE DAILY AFTER  BREAKFAST. 30 tablet 5  . CALCIUM CITRATE PO Take 1,200 mg by mouth daily.      .  cholecalciferol (VITAMIN D) 1000 UNITS tablet Take 3,000 Units by mouth daily.      . diazepam (VALIUM) 5 MG tablet TAKE ONE TABLET BY MOUTH EVERY 12 HOURS AS NEEDED FOR ANXIETY 30 tablet 0  . furosemide (LASIX) 20 MG tablet TAKE ONE TABLET BY MOUTH ONCE DAILY 90 tablet 2  . gabapentin (NEURONTIN) 100 MG capsule TAKE ONE CAPSULE BY MOUTH TWICE DAILY BEFORE A MEAL 60 capsule 0  . glimepiride (AMARYL) 2 MG tablet TAKE ONE TABLET BY MOUTH ONCE DAILY BEFORE BREAKFAST 30 tablet 5  . levothyroxine (SYNTHROID, LEVOTHROID) 112 MCG tablet TAKE ONE TABLET BY MOUTH ONCE DAILY BEFORE  BREAKFAST 90 tablet 1  . loratadine (CLARITIN) 10 MG tablet Take 10 mg by mouth daily as needed for allergies.    . predniSONE (DELTASONE) 5 MG tablet Take 1 tablet (5 mg total) by mouth daily with breakfast. 30 tablet 2  . rosuvastatin (CRESTOR) 5 MG tablet Take 1 tablet (5 mg total) by mouth daily. 90 tablet 3  . triamcinolone cream (KENALOG) 0.1 % Apply 1 application topically 2 (two) times daily as needed. 453 g 1  . valsartan (DIOVAN) 160 MG tablet TAKE ONE TABLET BY MOUTH ONCE DAILY 30 tablet 0   No current  facility-administered medications for this visit.    Allergies:    Allergies  Allergen Reactions  . Codeine Sulfate Other (See Comments)    REACTION: GI upset  . Sulfonamide Derivatives Other (See Comments)    REACTION: GI upset  . Vancomycin Other (See Comments)    Red man syndrome  . Atarax [Hydroxyzine] Nausea Only and Rash    Social History:  The patient  reports that she quit smoking about 33 years ago. Her smoking use included Cigarettes. She has a 6 pack-year smoking history. She has never used smokeless tobacco. She reports that she does not drink alcohol or use illicit drugs.   ROS:  Please see the history of present illness.   No hemoptysis.   All other systems reviewed and negative.   PHYSICAL EXAM: VS:  BP 120/80 mmHg  Pulse 60  Ht 5\' 6"  (1.676 m)  Wt 93.441 kg (206 lb)  BMI 33.27  kg/m2 Well nourished, well developed, in no acute distress. She is seen in a wheelchair. HEENT: normal Neck: no JVD or bruits Cardiac:  normal S1, S2; irregularly irregular rhythm; no murmur or gallop Lungs:  Decreased breath sounds bilaterally. Scant crackles in bases. Abd: soft, nontender Ext: tr ankle edema Skin: warm and dry Neuro:  CNs 2-12 intact, no focal abnormalities noted  Laboratory data:  Lab Results  Component Value Date   WBC 7.0 09/27/2014   HGB 7.7* 09/27/2014   HCT 23.4* 09/27/2014   PLT 221 09/27/2014   GLUCOSE 108* 01/24/2015   CHOL 187 01/24/2015   TRIG 132.0 01/24/2015   HDL 73.80 01/24/2015   LDLDIRECT 75.3 10/21/2011   LDLCALC 87 01/24/2015   ALT 9 01/24/2015   AST 20 01/24/2015   NA 129* 01/24/2015   K 4.4 01/24/2015   CL 92* 01/24/2015   CREATININE 1.55* 01/24/2015   BUN 22 01/24/2015   CO2 28 01/24/2015   TSH 0.71 05/17/2014   INR 1.70* 08/18/2013   HGBA1C 6.8* 01/24/2015   MICROALBUR 1.7 08/15/2009     ASSESSMENT AND PLAN:  1. Atrial Fibrillation:  Rate controlled.    She is on the correct dose of Eliquis for her (age > 80 and weight > 60 kg).  CHADS2-VASc=6.  She is asymptomatic. Continue current therapy.  2. Pulmonary HTN:  Likely related to sequelae of Wegener's granulomatosis. 3. Hypertension:  BP well controlled.  Continue to monitor. 4. Hyperlipidemia:  Continue statin. 5. Granulomatosis with Polyangiitis (Wegener's):  Continue follow up with pulmonary and oncology as planned  6. Cronic diastolic dysfunction. Well compensated

## 2015-06-25 ENCOUNTER — Ambulatory Visit: Payer: Medicare Other

## 2015-07-02 ENCOUNTER — Other Ambulatory Visit: Payer: Self-pay | Admitting: Family Medicine

## 2015-07-02 ENCOUNTER — Telehealth: Payer: Self-pay | Admitting: Cardiology

## 2015-07-02 ENCOUNTER — Other Ambulatory Visit: Payer: Self-pay | Admitting: Internal Medicine

## 2015-07-02 NOTE — Telephone Encounter (Signed)
New message      Calling to let the nurse know if she needs any help completing paperwork to lower cost of eliquis, please call.  This company assist in completing eliquis tier exception and prior authorization forms

## 2015-07-02 NOTE — Telephone Encounter (Signed)
Spoke with patient. Advised her I would request an authorization for her Eliquis.

## 2015-07-03 ENCOUNTER — Telehealth: Payer: Self-pay

## 2015-07-03 NOTE — Telephone Encounter (Signed)
Tier exception request for Eliquis 2.59m sent to [employer].

## 2015-07-03 NOTE — Telephone Encounter (Signed)
Tier reduction for Eliquis granted through [employer]. Patient notified.

## 2015-07-09 ENCOUNTER — Ambulatory Visit: Payer: Medicare Other

## 2015-07-10 ENCOUNTER — Ambulatory Visit (INDEPENDENT_AMBULATORY_CARE_PROVIDER_SITE_OTHER): Payer: Medicare Other | Admitting: Adult Health

## 2015-07-10 ENCOUNTER — Encounter: Payer: Self-pay | Admitting: Adult Health

## 2015-07-10 VITALS — BP 122/64 | Temp 97.7°F | Wt 199.0 lb

## 2015-07-10 DIAGNOSIS — L299 Pruritus, unspecified: Secondary | ICD-10-CM | POA: Diagnosis not present

## 2015-07-10 MED ORDER — METHYLPREDNISOLONE ACETATE 80 MG/ML IJ SUSP
80.0000 mg | Freq: Once | INTRAMUSCULAR | Status: AC
  Administered 2015-07-10: 80 mg via INTRAMUSCULAR

## 2015-07-10 NOTE — Progress Notes (Signed)
Pre visit review using our clinic review tool, if applicable. No additional management support is needed unless otherwise documented below in the visit note.

## 2015-07-10 NOTE — Progress Notes (Signed)
Subjective:    Patient ID: [name] [name], female    DOB: [birth year], 82 y.o.   MRN: [id number]  HPI 82 year old female, patient of Dr. Elease Hashimoto, who I am seeing for a follow up regarding itching. She last saw Dr Elease Hashimoto on 05/15/2015 for a month prior of itching. She has not had any recent detergent changes/. Uses [employer] soap, and has no history of food allergies. She has been using lotion and has applied prescribed Kenalog cream. She reports that nothing has helped with the itching.   She does not have any hives or rash. Most of the itching is located on trunk and upper extremities.   Review of Systems  Constitutional: Negative.   Respiratory: Negative.   Cardiovascular: Negative.   Skin: Positive for rash. Negative for color change, pallor and wound.  All other systems reviewed and are negative.  Past Medical History  Diagnosis Date  . HYPOTHYROIDISM 07/24/2008  . HYPERLIPIDEMIA 07/24/2008  . DEPRESSION 05/16/2009  . HYPERTENSION 07/24/2008  . Vertigo   . Diabetes mellitus type II   . Arthritis     r tkr  . CVA (cerebral vascular accident) ([facility]) 2008    mini stroke.no residual  . Heart murmur     stress test 2009.dr peter [place]  . Intermittent vertigo   . Skin abnormality     facial lesions .pt applying mupiracin to areas  . Atrial fibrillation ([place])   . Breast cancer ([place] [facility])   . Pneumonia   . Wegener's disease, pulmonary ([facility]) 10/2012    Social History   Social History  . Marital Status: Married    Spouse Name: N/A  . Number of Children: N/A  . Years of Education: N/A   Occupational History  . Not on file.   Social History Main Topics  . Smoking status: Former Smoker -- 0.50 packs/day for 12 years    Types: Cigarettes    Quit date: 06/04/1982  . Smokeless tobacco: Never Used  . Alcohol Use: No  . Drug Use: No  . Sexual Activity: Yes    Birth Control/ Protection: Post-menopausal   Other Topics Concern  . Not on file   Social History Narrative    Past  Surgical History  Procedure Laterality Date  . Knee surgery  2009    TKR  . Cholecystectomy  1980  . Joint replacement      r knee  . Breast surgery  2007    Lumpectomy, XRT 2006.l breast  . Video assisted thoracoscopy  04/22/2011    Procedure: VIDEO ASSISTED THORACOSCOPY;  Surgeon: Melrose Nakayama, MD;  Location: [facility];  Service: Thoracic;  Laterality: Left;  WITH BIOPSY  . Heel spur surgery Right   . Exploratory laparotomy      Family History  Problem Relation Age of Onset  . Arthritis Mother   . Rheum arthritis Mother   . Heart disease Father   . Coronary artery disease Father   . Cancer Sister     breast CA, both sisters  . Breast cancer Sister   . Coronary artery disease Brother   . Lung cancer Brother     Allergies  Allergen Reactions  . Codeine Sulfate Other (See Comments)    REACTION: GI upset  . Sulfonamide Derivatives Other (See Comments)    REACTION: GI upset  . Vancomycin Other (See Comments)    Red man syndrome  . Atarax [Hydroxyzine] Nausea Only and Rash    Current Outpatient Prescriptions on File  Prior to Visit  Medication Sig Dispense Refill  . amLODipine (NORVASC) 5 MG tablet TAKE ONE TABLET BY MOUTH ONCE DAILY 30 tablet 11  . apixaban (ELIQUIS) 2.5 MG TABS tablet Take 1 tablet (2.5 mg total) by mouth 2 (two) times daily. 180 tablet 0  . BYSTOLIC 10 MG tablet TAKE ONE TABLET BY MOUTH ONCE DAILY AFTER  BREAKFAST. 30 tablet 5  . CALCIUM CITRATE PO Take 1,200 mg by mouth daily.      . cholecalciferol (VITAMIN D) 1000 UNITS tablet Take 3,000 Units by mouth daily.      . diazepam (VALIUM) 5 MG tablet TAKE ONE TABLET BY MOUTH EVERY 12 HOURS AS NEEDED FOR ANXIETY 30 tablet 0  . furosemide (LASIX) 20 MG tablet TAKE ONE TABLET BY MOUTH ONCE DAILY 90 tablet 2  . gabapentin (NEURONTIN) 100 MG capsule TAKE ONE CAPSULE BY MOUTH TWICE DAILY BEFORE A MEAL. 60 capsule 5  . glimepiride (AMARYL) 2 MG tablet TAKE ONE TABLET BY MOUTH ONCE DAILY BEFORE BREAKFAST 30  tablet 5  . levothyroxine (SYNTHROID, LEVOTHROID) 112 MCG tablet TAKE ONE TABLET BY MOUTH ONCE DAILY BEFORE  BREAKFAST 90 tablet 1  . loratadine (CLARITIN) 10 MG tablet Take 10 mg by mouth daily as needed for allergies.    . predniSONE (DELTASONE) 5 MG tablet Take 1 tablet (5 mg total) by mouth daily with breakfast. 30 tablet 2  . rosuvastatin (CRESTOR) 5 MG tablet Take 1 tablet (5 mg total) by mouth daily. 90 tablet 3  . triamcinolone cream (KENALOG) 0.1 % Apply 1 application topically 2 (two) times daily as needed. 453 g 1  . valsartan (DIOVAN) 160 MG tablet TAKE ONE TABLET BY MOUTH ONCE DAILY 30 tablet 0   No current facility-administered medications on file prior to visit.    BP 122/64 mmHg  Temp(Src) 97.7 F (36.5 C) (Oral)  Wt 199 lb (90.266 kg)       Objective:   Physical Exam  Constitutional: She is oriented to person, place, and time. She appears well-developed and well-nourished. No distress.  Cardiovascular: Normal rate, regular rhythm, normal heart sounds and intact distal pulses.  Exam reveals no gallop and no friction rub.   No murmur heard. Pulmonary/Chest: Effort normal and breath sounds normal. No respiratory distress. She has no wheezes. She has no rales. She exhibits no tenderness.  Neurological: She is alert and oriented to person, place, and time.  Skin: Skin is warm and dry. Rash (She has a few scattered excoriations on her upper back and arms) noted. She is not diaphoretic. No erythema. No pallor.  Psychiatric: She has a normal mood and affect. Her behavior is normal. Judgment and thought content normal.  Nursing note and vitals reviewed.     Assessment & Plan:  1. Itching - Unsure of the cause of her rash/itching. She is on a few medications that can cause itching but she has been on these medications for some time now.  - methylPREDNISolone acetate (DEPO-MEDROL) injection 80 mg; Inject 1 mL (80 mg total) into the muscle once. - Benadryl cream - Advised  dermatology follow up if Depo Medrol shot does not work.   Dorothyann Peng, NP

## 2015-07-10 NOTE — Patient Instructions (Signed)
It was great meeting you today!  We have given you a shot of prednisone in the office. This may make your blood sugars go up.   Pick up a benadryl cream at the drug store.   If this does not work, then please follow up with dermatology.

## 2015-07-11 ENCOUNTER — Telehealth: Payer: Self-pay | Admitting: Internal Medicine

## 2015-07-11 ENCOUNTER — Other Ambulatory Visit: Payer: Self-pay | Admitting: Family Medicine

## 2015-07-11 NOTE — Telephone Encounter (Signed)
Last ov with CY on 03/20/16 Patient Instructions       Order- schedule ONOX on room air      Chronic respiratory failure with hypoxia  Ok to reduce prednisone to 5 mg daily  Please call if we can help   Called and spoke with pt. She c/o itching that has lasted x 3 weeks. She states that nothing helps. She has tried benadryl cream, Claritin, and a benadryl shot that was given by Dr. Elease Hashimoto. She states the only thing that helps her is a lukewarm shower. She is requesting recs and a allergy test from CY. I explained to her that CY was out of the office today but I would send the message to the doc of the day for his recs and would send a message to CY to address the allergy testing when he comes back into the office on 07/12/15. She voiced understanding and had no further questions.   PM please advise

## 2015-07-11 NOTE — Telephone Encounter (Signed)
Per previous notes with [facility] (PCP) they recommended that she contact her Dermatologist if Depo shot and Benadryl cream does not work.

## 2015-07-11 NOTE — Telephone Encounter (Signed)
Called and spoke with pt. Reviewed results and recs. Pt voiced understanding and had no further questions.

## 2015-07-11 NOTE — Telephone Encounter (Signed)
Looks like she does not have a rash and is under considerable stress at home. Recommend stress reduction, skin moisturizers, calamine lotion. She can also try DermaSarra Anti-Itch OTC. Follow up with Dr. Elease Hashimoto and Dr. Annamaria Boots

## 2015-07-13 ENCOUNTER — Ambulatory Visit: Payer: Medicare Other

## 2015-07-13 DIAGNOSIS — L298 Other pruritus: Secondary | ICD-10-CM | POA: Diagnosis not present

## 2015-07-13 DIAGNOSIS — D692 Other nonthrombocytopenic purpura: Secondary | ICD-10-CM | POA: Diagnosis not present

## 2015-07-17 ENCOUNTER — Other Ambulatory Visit: Payer: Self-pay | Admitting: Internal Medicine

## 2015-07-31 ENCOUNTER — Ambulatory Visit (INDEPENDENT_AMBULATORY_CARE_PROVIDER_SITE_OTHER): Payer: Medicare Other | Admitting: Family Medicine

## 2015-07-31 VITALS — BP 110/70 | HR 58 | Temp 97.8°F | Ht 66.0 in | Wt 202.0 lb

## 2015-07-31 DIAGNOSIS — E871 Hypo-osmolality and hyponatremia: Secondary | ICD-10-CM

## 2015-07-31 DIAGNOSIS — E039 Hypothyroidism, unspecified: Secondary | ICD-10-CM | POA: Diagnosis not present

## 2015-07-31 DIAGNOSIS — E119 Type 2 diabetes mellitus without complications: Secondary | ICD-10-CM

## 2015-07-31 DIAGNOSIS — N184 Chronic kidney disease, stage 4 (severe): Secondary | ICD-10-CM | POA: Diagnosis not present

## 2015-07-31 NOTE — Progress Notes (Signed)
Subjective:    Patient ID: [name] [name], female    DOB: 04/07/1933, 82 y.o.   MRN: [id number]  HPI Follow-up multiple medical problems.  History of obesity, Wegener's granulomatosis, type 2 diabetes, pulmonary hypertension, osteoarthritis, normocytic anemia, hypothyroidism, dyslipidemia, history depression, COPD, chronic kidney disease, chronic atrial fibrillation, remote history of breast cancer She also has chronic intermittent vertigo. History of what sounds like acute vestibular neuritis several years ago. She continues have challenges taking care of her husband who has dementia  Has not been taking her Crestor for hyperlipidemia for several months. She denied any side effects. Chronic hyponatremia which did not improve much with discontinuation of thiazides.  Type 2 diabetes. Recent fasting blood sugars usually around 70. No hypoglycemic symptoms. Last A1c 6.8%. Low-dose glimepiride.  Hypothyroidism on replacement. Compliant with therapy. Overdue for recheck. Chronic kidney disease which has between stage III and stage IV.  Past Medical History  Diagnosis Date  . HYPOTHYROIDISM 07/24/2008  . HYPERLIPIDEMIA 07/24/2008  . DEPRESSION 05/16/2009  . HYPERTENSION 07/24/2008  . Vertigo   . Diabetes mellitus type II   . Arthritis     r tkr  . CVA (cerebral vascular accident) ([facility]) 2008    mini stroke.no residual  . Heart murmur     stress test 2009.dr peter [place]  . Intermittent vertigo   . Skin abnormality     facial lesions .pt applying mupiracin to areas  . Atrial fibrillation ([place])   . Breast cancer ([facility])   . Pneumonia   . Wegener's disease, pulmonary ([facility]) 10/2012   Past Surgical History  Procedure Laterality Date  . Knee surgery  2009    TKR  . Cholecystectomy  1980  . Joint replacement      r knee  . Breast surgery  2007    Lumpectomy, XRT 2006.l breast  . Video assisted thoracoscopy  04/22/2011    Procedure: VIDEO ASSISTED THORACOSCOPY;  Surgeon: Melrose Nakayama,  MD;  Location: [facility];  Service: Thoracic;  Laterality: Left;  WITH BIOPSY  . Heel spur surgery Right   . Exploratory laparotomy      reports that she quit smoking about 33 years ago. Her smoking use included Cigarettes. She has a 6 pack-year smoking history. She has never used smokeless tobacco. She reports that she does not drink alcohol or use illicit drugs. family history includes Arthritis in her mother; Breast cancer in her sister; Cancer in her sister; Coronary artery disease in her brother and father; Heart disease in her father; Lung cancer in her brother; Rheum arthritis in her mother. Allergies  Allergen Reactions  . Codeine Sulfate Other (See Comments)    REACTION: GI upset  . Sulfonamide Derivatives Other (See Comments)    REACTION: GI upset  . Vancomycin Other (See Comments)    Red man syndrome  . Atarax [Hydroxyzine] Nausea Only and Rash      Review of Systems  Constitutional: Positive for fatigue. Negative for unexpected weight change.  Eyes: Negative for visual disturbance.  Respiratory: Negative for cough, chest tightness, shortness of breath and wheezing.   Cardiovascular: Negative for chest pain, palpitations and leg swelling.  Gastrointestinal: Negative for abdominal pain.  Endocrine: Negative for polydipsia and polyuria.  Genitourinary: Negative for dysuria.  Neurological: Negative for dizziness, seizures, syncope, weakness, light-headedness and headaches.       Objective:   Physical Exam  Constitutional: She appears well-developed and well-nourished.  Eyes: Pupils are equal, round, and reactive to light.  Neck:  Neck supple. No JVD present. No thyromegaly present.  Cardiovascular: Normal rate and regular rhythm.  Exam reveals no gallop.   Pulmonary/Chest: Effort normal and breath sounds normal. No respiratory distress. She has no wheezes. She has no rales.  Musculoskeletal: She exhibits no edema.  Neurological: She is alert.          Assessment &  Plan:  #1 type 2 diabetes. History of good control. Recheck A1c. If improved consider discontinue glimepiride  #2 hypertension stable and at goal  #3 hypothyroidism. Recheck TSH  #4 history of Wegener's granulomatosis and pulmonary hypertension. Followed by pulmonary. Maintained on low-dose prednisone.  Reminder for daily calcium and Vit D.    #5 history of chronic hyponatremia. Recheck basic metabolic panel  #6 history of dyslipidemia. She states on lipids were checked last fall she was not taking her Crestor then and lipids were favorable. Hold Crestor at this time  #7 history of chronic atrial fibrillation on Eliquis. Rate controlled  [name] MD [facility]

## 2015-07-31 NOTE — Progress Notes (Signed)
Pre visit review using our clinic review tool, if applicable. No additional management support is needed unless otherwise documented below in the visit note.

## 2015-08-14 ENCOUNTER — Ambulatory Visit
Admission: RE | Admit: 2015-08-14 | Discharge: 2015-08-14 | Disposition: A | Discharge status: Home / Self Care | Payer: Medicare Other | Source: Ambulatory Visit

## 2015-08-14 DIAGNOSIS — Z1231 Encounter for screening mammogram for malignant neoplasm of breast: Secondary | ICD-10-CM | POA: Diagnosis not present

## 2015-09-19 ENCOUNTER — Ambulatory Visit: Payer: Medicare Other | Admitting: Internal Medicine

## 2015-09-27 ENCOUNTER — Other Ambulatory Visit: Payer: Self-pay | Admitting: *Deleted

## 2015-09-27 MED ORDER — GLIMEPIRIDE 2 MG PO TABS: ORAL_TABLET | ORAL | Status: DC

## 2015-10-01 ENCOUNTER — Other Ambulatory Visit: Payer: Self-pay | Admitting: Family Medicine

## 2015-10-02 NOTE — Telephone Encounter (Signed)
Refill once.  Avoid regular use.

## 2015-10-19 ENCOUNTER — Other Ambulatory Visit: Payer: Self-pay | Admitting: Family Medicine

## 2015-10-19 MED ORDER — GLIMEPIRIDE 2 MG PO TABS: ORAL_TABLET | ORAL | Status: DC

## 2015-10-19 MED ORDER — VALSARTAN 160 MG PO TABS: 160.0000 mg | ORAL_TABLET | Freq: Every day | ORAL | Status: DC

## 2015-10-29 ENCOUNTER — Other Ambulatory Visit: Payer: Self-pay | Admitting: Family Medicine

## 2015-11-13 ENCOUNTER — Other Ambulatory Visit: Payer: Self-pay | Admitting: Family Medicine

## 2015-11-14 NOTE — Telephone Encounter (Signed)
Last OV 07/31/2015 Last refill 10/02/2015 #30, 9rf Please advise

## 2015-11-15 NOTE — Telephone Encounter (Signed)
She was prescribed this years ago, apparently for chronic intermittent vertigo.  She should only be taking for severe vertigo flares. Would refill only #20 and avoid regular use.

## 2015-11-20 ENCOUNTER — Other Ambulatory Visit: Payer: Self-pay | Admitting: Family Medicine

## 2015-11-26 ENCOUNTER — Other Ambulatory Visit: Payer: Self-pay | Admitting: Family Medicine

## 2015-11-30 ENCOUNTER — Encounter: Payer: Self-pay | Admitting: Family Medicine

## 2015-11-30 ENCOUNTER — Ambulatory Visit (INDEPENDENT_AMBULATORY_CARE_PROVIDER_SITE_OTHER): Payer: Medicare Other | Admitting: Family Medicine

## 2015-11-30 VITALS — BP 130/70 | HR 65 | Temp 98.0°F | Ht 66.0 in | Wt 186.0 lb

## 2015-11-30 DIAGNOSIS — E119 Type 2 diabetes mellitus without complications: Secondary | ICD-10-CM

## 2015-11-30 DIAGNOSIS — F329 Major depressive disorder, single episode, unspecified: Secondary | ICD-10-CM | POA: Diagnosis not present

## 2015-11-30 DIAGNOSIS — E785 Hyperlipidemia, unspecified: Secondary | ICD-10-CM

## 2015-11-30 DIAGNOSIS — I10 Essential (primary) hypertension: Secondary | ICD-10-CM | POA: Diagnosis not present

## 2015-11-30 DIAGNOSIS — E039 Hypothyroidism, unspecified: Secondary | ICD-10-CM | POA: Diagnosis not present

## 2015-11-30 DIAGNOSIS — F32A Depression, unspecified: Secondary | ICD-10-CM

## 2015-11-30 DIAGNOSIS — N184 Chronic kidney disease, stage 4 (severe): Secondary | ICD-10-CM

## 2015-11-30 LAB — BASIC METABOLIC PANEL
BUN: 18 mg/dL (ref 6–23)
CHLORIDE: 92 meq/L — AB (ref 96–112)
CO2: 28 meq/L (ref 19–32)
CREATININE: 1.48 mg/dL — AB (ref 0.40–1.20)
Calcium: 8.9 mg/dL (ref 8.4–10.5)
GFR: 35.95 mL/min — ABNORMAL LOW (ref >60.00)
GLUCOSE: 80 mg/dL (ref 70–99)
Potassium: 4.4 mEq/L (ref 3.5–5.1)
Sodium: 126 mEq/L — ABNORMAL LOW (ref 135–145)

## 2015-11-30 LAB — HEPATIC FUNCTION PANEL
ALT: 7 U/L (ref 0–35)
AST: 16 U/L (ref 0–37)
Albumin: 4 g/dL (ref 3.5–5.2)
Alkaline Phosphatase: 56 U/L (ref 39–117)
BILIRUBIN DIRECT: 0.3 mg/dL (ref 0.0–0.3)
TOTAL PROTEIN: 7.2 g/dL (ref 6.0–8.3)
Total Bilirubin: 1 mg/dL (ref 0.2–1.2)

## 2015-11-30 LAB — LIPID PANEL
CHOL/HDL RATIO: 2
Cholesterol: 132 mg/dL (ref 0–200)
HDL: 60 mg/dL (ref >39.00)
LDL CALC: 52 mg/dL (ref 0–99)
NONHDL: 72.23
Triglycerides: 100 mg/dL (ref 0.0–149.0)
VLDL: 20 mg/dL (ref 0.0–40.0)

## 2015-11-30 LAB — TSH: TSH: 0.19 u[IU]/mL — ABNORMAL LOW (ref 0.35–4.50)

## 2015-11-30 LAB — HEMOGLOBIN A1C: HEMOGLOBIN A1C: 6.2 % (ref 4.6–6.5)

## 2015-11-30 MED ORDER — ESCITALOPRAM OXALATE 10 MG PO TABS: 10.0000 mg | ORAL_TABLET | Freq: Every day | ORAL | 6 refills | Status: DC

## 2015-11-30 NOTE — Progress Notes (Signed)
Pre visit review using our clinic review tool, if applicable. No additional management support is needed unless otherwise documented below in the visit note.

## 2015-11-30 NOTE — Patient Instructions (Signed)
Start the Lexapro one daily

## 2015-11-30 NOTE — Progress Notes (Signed)
Subjective:     Patient ID: [name] [name], female   DOB: March 30, 1933, 82 y.o.   MRN: [id number]  HPI Patient seen for follow-up multiple medical problems She has history of obesity, hypertension, diastolic heart failure, atrial fibrillation, Wegener's granulomatosis, COPD, type 2 diabetes, chronic intermittent vertigo, chronic kidney disease, hyperlipidemia, remote history of breast cancer.  Her husband has frontal lobe dementia and she is tremendously stressed regarding taking care of him 24/7. She has very little support. She feels very depressed and hopeless at times. No suicidal ideation. Frequent crying spells. Daily anxiety symptoms. Loss of interest in hobbies.  Type 2 diabetes. Not monitoring blood sugars. Generally well-controlled in past. No recent hypoglycemia. Hypertension has been stable. Compliant with medications. She remains on Eliquis for atrial fibrillation She's had some loss of appetite which she attributes to increased stress issues  Past Medical History:  Diagnosis Date  . Arthritis    r tkr  . Atrial fibrillation ([place])   . Breast cancer ([place])   . CVA (cerebral vascular accident) ([place]) 2008   mini stroke.no residual  . DEPRESSION 05/16/2009  . Diabetes mellitus type II   . Heart murmur    stress test 2009.dr peter Martinique  . HYPERLIPIDEMIA 07/24/2008  . HYPERTENSION 07/24/2008  . HYPOTHYROIDISM 07/24/2008  . Intermittent vertigo   . Pneumonia   . Skin abnormality    facial lesions .pt applying mupiracin to areas  . Vertigo   . Wegener's disease, pulmonary ([place]) 10/2012   Past Surgical History:  Procedure Laterality Date  . BREAST SURGERY  2007   Lumpectomy, XRT 2006.l breast  . CHOLECYSTECTOMY  1980  . EXPLORATORY LAPAROTOMY    . HEEL SPUR SURGERY Right   . JOINT REPLACEMENT     r knee  . KNEE SURGERY  2009   TKR  . VIDEO ASSISTED THORACOSCOPY  04/22/2011   Procedure: VIDEO ASSISTED THORACOSCOPY;  Surgeon: Melrose Nakayama, MD;  Location: [facility];   Service: Thoracic;  Laterality: Left;  WITH BIOPSY    reports that she quit smoking about 33 years ago. Her smoking use included Cigarettes. She has a 6.00 pack-year smoking history. She has never used smokeless tobacco. She reports that she does not drink alcohol or use drugs. family history includes Arthritis in her mother; Breast cancer in her sister; Cancer in her sister; Coronary artery disease in her brother and father; Heart disease in her father; Lung cancer in her brother; Rheum arthritis in her mother. Allergies  Allergen Reactions  . Codeine Sulfate Other (See Comments)    REACTION: GI upset  . Sulfonamide Derivatives Other (See Comments)    REACTION: GI upset  . Vancomycin Other (See Comments)    Red man syndrome  . Atarax [Hydroxyzine] Nausea Only and Rash     Review of Systems  Constitutional: Positive for fatigue. Negative for chills and fever.  Respiratory: Negative for cough and shortness of breath.   Cardiovascular: Negative for chest pain and palpitations.  Gastrointestinal: Negative for abdominal pain.  Genitourinary: Negative for dysuria.  Neurological: Negative for headaches.  Psychiatric/Behavioral: Positive for dysphoric mood. Negative for agitation and suicidal ideas. The patient is nervous/anxious.        Objective:   Physical Exam  Constitutional: She appears well-developed and well-nourished. No distress.  Cardiovascular: Normal rate.   Pulmonary/Chest: Effort normal and breath sounds normal. No respiratory distress. She has no wheezes. She has no rales.  Musculoskeletal:  Trace edema ankles bilaterally  Assessment:     #1 situational stress with probable major depression  #2 type 2 diabetes with history of good control  #3 hyperlipidemia  #4 hypothyroidism  #5 hypertension stable and at goal  #6 hyperlipidemia  #7 atrial fibrillation on chronic Eliquis    Plan:     -Check labs with A1c, TSH, lipid panel, hepatic panel, basic  metabolic panel -Start Lexapro 10 mg once daily. Reassess in one month. Reviewed possible side effects -We discussed possible additional support for her home situation. She is reluctant to get additional care at this time. She is especially reluctant to consider placement for husband at this time  [name] MD [facility]

## 2015-12-03 ENCOUNTER — Telehealth: Payer: Self-pay | Admitting: Emergency Medicine

## 2015-12-03 NOTE — Telephone Encounter (Signed)
Called and spoke to pt. Pt states " that she drank some ginger ale and is feeling better. Pt states that she ate a sandwich this morning and has taken the medication and is feeling fine. Pt states that if she gets to feeling bad she will stop the medication. Pt declines an appointment at this time and states she will follow up with Dr. Elease Hashimoto 12/31/15 at her follow up appointment.     PLEASE NOTE: All timestamps contained within this report are represented as [place] Standard Time. CONFIDENTIALTY NOTICE: This fax transmission is intended only for the addressee. It contains information that is legally privileged, confidential or otherwise protected from use or disclosure. If you are not the intended recipient, you are strictly prohibited from reviewing, disclosing, copying using or disseminating any of this information or taking any action in reliance on or regarding this information. If you have received this fax in error, please notify us immediately by telephone so that we can arrange for its return to [place]. Phone: [phone], Toll-Free: [phone], Fax: [phone] Page: 1 of 2 Call Id: [id number] [facility] Night - Client [place] Patient Name: [name] [name] Gender: Female DOB: 1932/11/03 Age: 82 Y 3 M 4 D Return Phone Number: [phone] (Primary) Address: City/State/Zip: [place] Client [place] Night - Client Client Site [facility] - Night Physician Carolann Littler - MD Contact Type Call Who Is Calling Patient / Member / Family / Caregiver Call Type Triage / Clinical Relationship To Patient Self Return Phone Number [phone] (Primary) Chief Complaint Medication reaction Reason for Call Symptomatic / Request for [place] says, was seen yesterday, prescribed a new Rx, after taking it once, she vomited several times. She is still feeling  nauseated today. PreDisposition Call Doctor Translation No Nurse Assessment Nurse: Lillia Corporal, RN, Lenell Antu Date/Time Eilene Ghazi Time): 12/01/2015 9:41:15 AM Confirm and document reason for call. If symptomatic, describe symptoms. You must click the next button to save text entered. ---Caller says, was seen yesterday, prescribed a new Rx, after taking it once, she vomited several times. She is still feeling nauseated today. States the prescription was for anxiety. Escitalopram 10 mg. States began taking medication yesterday, and within a couple of hours felt sick and vomited. States she vomited anything she ate. States she was unable to hold fluids last night, has not tried today. Reports she has had diarrhea within the past 2-3 days. Reports has felt hot, had chills, and has been shaking. States the fever- like symptoms have improved today, but had those symptoms for 2 days. Denies any diarrhea today. States urinated this am, also last night before going to bed. Denies any pain or burning with urination. Has the patient traveled out of the country within the last 30 days? ---Not Applicable Does the patient have any new or worsening symptoms? ---Yes Will a triage be completed? ---Yes Related visit to physician within the last 2 weeks? ---Yes Does the PT have any chronic conditions? (i.e. diabetes, asthma, etc.) ---Yes List chronic conditions. ---CHF, diabetes (on a daily pill- glimiperide 2 mg daily). Is this a behavioral health or substance abuse call? ---No PLEASE NOTE: All timestamps contained within this report are represented as [place] Standard Time. CONFIDENTIALTY NOTICE: This fax transmission is intended only for the addressee. It contains information that is legally privileged, confidential or otherwise protected from use or disclosure. If you are not the intended recipient, you are strictly prohibited from reviewing,  disclosing, copying using or disseminating any of this information  or taking any action in reliance on or regarding this information. If you have received this fax in error, please notify us immediately by telephone so that we can arrange for its return to [place]. Phone: [phone], Toll-Free: [phone], Fax: [phone] Page: 2 of 2 Call Id: [id number] Guidelines Guideline Title Affirmed Question Affirmed Notes Nurse Date/Time Eilene Ghazi Time) Vomiting [1] MODERATE vomiting (e.g., 3 - 5 times/day) AND [2] age [place], [place], Lenell Antu 12/01/2015 9:47:52 AM Disp. Time Eilene Ghazi Time) Disposition Final User 12/01/2015 9:54:09 AM Go to ED Now (or PCP triage) Yes Lillia Corporal, RN, [place] Found Understands: Yes Disagree/Comply: Comply Care Advice Given Per Guideline GO TO ED NOW (OR PCP TRIAGE): BRING MEDICINES: CARE ADVICE per Vomiting (Adult) guideline. * IF PCP TRIAGE REQUIRED: You may need to be seen. Your doctor will want to talk with you to decide what's best. I'll page him now. If you haven't heard from the on-call doctor within 30 minutes, go directly to the [facility] at _____________ [place]: [place]a Jansky, RN Date/Time Eilene Ghazi Time): 12/01/2015 9:49:33 AM States she thinks she might have vomited her medications up. Denies any blood on emesis. Denies any flank blood on stool, Referrals [facility] - [facility]

## 2015-12-03 NOTE — Telephone Encounter (Signed)
I would have her take one half tablet of the new medication for the next 4-5 days and then progress up to one tablet daily SSRI medications can sometimes cause nausea.

## 2015-12-04 ENCOUNTER — Other Ambulatory Visit: Payer: Self-pay

## 2015-12-04 MED ORDER — LEVOTHYROXINE SODIUM 100 MCG PO TABS: 100.0000 ug | ORAL_TABLET | Freq: Every day | ORAL | 2 refills | Status: DC

## 2015-12-04 NOTE — Addendum Note (Signed)
Addended by: Elio Forget on: 12/04/2015 02:50 PM   Modules accepted: Orders

## 2015-12-04 NOTE — Telephone Encounter (Signed)
Pt is aware of annotations.

## 2015-12-10 ENCOUNTER — Emergency Department (HOSPITAL_COMMUNITY): Payer: Medicare Other

## 2015-12-10 ENCOUNTER — Encounter (HOSPITAL_COMMUNITY): Payer: Self-pay

## 2015-12-10 ENCOUNTER — Inpatient Hospital Stay (HOSPITAL_COMMUNITY)
Admission: EM | Admit: 2015-12-10 | Discharge: 2015-12-13 | DRG: 988 | Disposition: A | Discharge status: Home / Self Care | Payer: Medicare Other | Attending: Internal Medicine | Admitting: Internal Medicine

## 2015-12-10 DIAGNOSIS — L0390 Cellulitis, unspecified: Secondary | ICD-10-CM

## 2015-12-10 DIAGNOSIS — Z7901 Long term (current) use of anticoagulants: Secondary | ICD-10-CM | POA: Diagnosis not present

## 2015-12-10 DIAGNOSIS — E871 Hypo-osmolality and hyponatremia: Secondary | ICD-10-CM | POA: Diagnosis present

## 2015-12-10 DIAGNOSIS — L0291 Cutaneous abscess, unspecified: Secondary | ICD-10-CM | POA: Diagnosis not present

## 2015-12-10 DIAGNOSIS — L02611 Cutaneous abscess of right foot: Secondary | ICD-10-CM | POA: Diagnosis present

## 2015-12-10 DIAGNOSIS — E785 Hyperlipidemia, unspecified: Secondary | ICD-10-CM | POA: Diagnosis present

## 2015-12-10 DIAGNOSIS — E11621 Type 2 diabetes mellitus with foot ulcer: Secondary | ICD-10-CM

## 2015-12-10 DIAGNOSIS — Z923 Personal history of irradiation: Secondary | ICD-10-CM

## 2015-12-10 DIAGNOSIS — Z853 Personal history of malignant neoplasm of breast: Secondary | ICD-10-CM | POA: Diagnosis not present

## 2015-12-10 DIAGNOSIS — Z79899 Other long term (current) drug therapy: Secondary | ICD-10-CM | POA: Diagnosis not present

## 2015-12-10 DIAGNOSIS — E039 Hypothyroidism, unspecified: Secondary | ICD-10-CM

## 2015-12-10 DIAGNOSIS — I129 Hypertensive chronic kidney disease with stage 1 through stage 4 chronic kidney disease, or unspecified chronic kidney disease: Secondary | ICD-10-CM | POA: Diagnosis present

## 2015-12-10 DIAGNOSIS — Z9049 Acquired absence of other specified parts of digestive tract: Secondary | ICD-10-CM

## 2015-12-10 DIAGNOSIS — E1121 Type 2 diabetes mellitus with diabetic nephropathy: Secondary | ICD-10-CM | POA: Diagnosis present

## 2015-12-10 DIAGNOSIS — Z87891 Personal history of nicotine dependence: Secondary | ICD-10-CM | POA: Diagnosis not present

## 2015-12-10 DIAGNOSIS — L97509 Non-pressure chronic ulcer of other part of unspecified foot with unspecified severity: Secondary | ICD-10-CM | POA: Diagnosis present

## 2015-12-10 DIAGNOSIS — Z803 Family history of malignant neoplasm of breast: Secondary | ICD-10-CM

## 2015-12-10 DIAGNOSIS — I10 Essential (primary) hypertension: Secondary | ICD-10-CM

## 2015-12-10 DIAGNOSIS — L03115 Cellulitis of right lower limb: Secondary | ICD-10-CM | POA: Diagnosis present

## 2015-12-10 DIAGNOSIS — E11628 Type 2 diabetes mellitus with other skin complications: Secondary | ICD-10-CM | POA: Diagnosis not present

## 2015-12-10 DIAGNOSIS — Z96651 Presence of right artificial knee joint: Secondary | ICD-10-CM | POA: Diagnosis present

## 2015-12-10 DIAGNOSIS — L02619 Cutaneous abscess of unspecified foot: Secondary | ICD-10-CM | POA: Diagnosis present

## 2015-12-10 DIAGNOSIS — Z7984 Long term (current) use of oral hypoglycemic drugs: Secondary | ICD-10-CM

## 2015-12-10 DIAGNOSIS — I482 Chronic atrial fibrillation: Secondary | ICD-10-CM | POA: Diagnosis present

## 2015-12-10 DIAGNOSIS — Z8673 Personal history of transient ischemic attack (TIA), and cerebral infarction without residual deficits: Secondary | ICD-10-CM | POA: Diagnosis not present

## 2015-12-10 DIAGNOSIS — L97519 Non-pressure chronic ulcer of other part of right foot with unspecified severity: Secondary | ICD-10-CM

## 2015-12-10 DIAGNOSIS — F329 Major depressive disorder, single episode, unspecified: Secondary | ICD-10-CM | POA: Diagnosis present

## 2015-12-10 DIAGNOSIS — L02415 Cutaneous abscess of right lower limb: Secondary | ICD-10-CM | POA: Diagnosis not present

## 2015-12-10 DIAGNOSIS — E1142 Type 2 diabetes mellitus with diabetic polyneuropathy: Secondary | ICD-10-CM | POA: Diagnosis present

## 2015-12-10 DIAGNOSIS — I119 Hypertensive heart disease without heart failure: Secondary | ICD-10-CM | POA: Diagnosis present

## 2015-12-10 DIAGNOSIS — N183 Chronic kidney disease, stage 3 (moderate): Secondary | ICD-10-CM | POA: Diagnosis present

## 2015-12-10 DIAGNOSIS — L03119 Cellulitis of unspecified part of limb: Secondary | ICD-10-CM

## 2015-12-10 DIAGNOSIS — Z8249 Family history of ischemic heart disease and other diseases of the circulatory system: Secondary | ICD-10-CM | POA: Diagnosis not present

## 2015-12-10 DIAGNOSIS — E1122 Type 2 diabetes mellitus with diabetic chronic kidney disease: Secondary | ICD-10-CM | POA: Diagnosis present

## 2015-12-10 DIAGNOSIS — L089 Local infection of the skin and subcutaneous tissue, unspecified: Secondary | ICD-10-CM

## 2015-12-10 DIAGNOSIS — L0889 Other specified local infections of the skin and subcutaneous tissue: Secondary | ICD-10-CM | POA: Diagnosis not present

## 2015-12-10 LAB — CBC WITH DIFFERENTIAL/PLATELET
BASOS ABS: 0 10*3/uL (ref 0.0–0.1)
BASOS PCT: 0 %
EOS PCT: 0 %
Eosinophils Absolute: 0.1 10*3/uL (ref 0.0–0.7)
HCT: 31.2 % — ABNORMAL LOW (ref 36.0–46.0)
Hemoglobin: 10.3 g/dL — ABNORMAL LOW (ref 12.0–15.0)
Lymphocytes Relative: 8 %
Lymphs Abs: 1 10*3/uL (ref 0.7–4.0)
MCH: 30.3 pg (ref 26.0–34.0)
MCHC: 33 g/dL (ref 30.0–36.0)
MCV: 91.8 fL (ref 78.0–100.0)
MONO ABS: 1.5 10*3/uL — AB (ref 0.1–1.0)
Monocytes Relative: 11 %
Neutro Abs: 10.9 10*3/uL — ABNORMAL HIGH (ref 1.7–7.7)
Neutrophils Relative %: 81 %
PLATELETS: 253 10*3/uL (ref 150–400)
RBC: 3.4 MIL/uL — AB (ref 3.87–5.11)
RDW: 13.8 % (ref 11.5–15.5)
WBC: 13.6 10*3/uL — AB (ref 4.0–10.5)

## 2015-12-10 LAB — URINALYSIS, ROUTINE W REFLEX MICROSCOPIC
Glucose, UA: NEGATIVE mg/dL
KETONES UR: NEGATIVE mg/dL
NITRITE: NEGATIVE
PROTEIN: 30 mg/dL — AB
Specific Gravity, Urine: 1.016 (ref 1.005–1.030)
pH: 6 (ref 5.0–8.0)

## 2015-12-10 LAB — URINE MICROSCOPIC-ADD ON

## 2015-12-10 LAB — COMPREHENSIVE METABOLIC PANEL
ALT: 10 U/L — AB (ref 14–54)
AST: 21 U/L (ref 15–41)
Albumin: 3.7 g/dL (ref 3.5–5.0)
Alkaline Phosphatase: 61 U/L (ref 38–126)
Anion gap: 9 (ref 5–15)
BILIRUBIN TOTAL: 1.7 mg/dL — AB (ref 0.3–1.2)
BUN: 20 mg/dL (ref 6–20)
CHLORIDE: 97 mmol/L — AB (ref 101–111)
CO2: 24 mmol/L (ref 22–32)
CREATININE: 1.58 mg/dL — AB (ref 0.44–1.00)
Calcium: 8.8 mg/dL — ABNORMAL LOW (ref 8.9–10.3)
GFR, EST AFRICAN AMERICAN: 34 mL/min — AB (ref >60)
GFR, EST NON AFRICAN AMERICAN: 30 mL/min — AB (ref >60)
Glucose, Bld: 108 mg/dL — ABNORMAL HIGH (ref 65–99)
POTASSIUM: 4.4 mmol/L (ref 3.5–5.1)
Sodium: 130 mmol/L — ABNORMAL LOW (ref 135–145)
Total Protein: 7.7 g/dL (ref 6.5–8.1)

## 2015-12-10 LAB — I-STAT CG4 LACTIC ACID, ED: LACTIC ACID, VENOUS: 1.55 mmol/L (ref 0.5–1.9)

## 2015-12-10 LAB — CBG MONITORING, ED: Glucose-Capillary: 105 mg/dL — ABNORMAL HIGH (ref 65–99)

## 2015-12-10 LAB — GLUCOSE, CAPILLARY
GLUCOSE-CAPILLARY: 118 mg/dL — AB (ref 65–99)
GLUCOSE-CAPILLARY: 167 mg/dL — AB (ref 65–99)

## 2015-12-10 MED ORDER — ENOXAPARIN SODIUM 40 MG/0.4ML ~~LOC~~ SOLN: 40.0000 mg | SUBCUTANEOUS | Status: DC

## 2015-12-10 MED ORDER — SODIUM CHLORIDE 0.9% FLUSH
3.0000 mL | Freq: Two times a day (BID) | INTRAVENOUS | Status: DC
  Administered 2015-12-12: 3 mL via INTRAVENOUS

## 2015-12-10 MED ORDER — ONDANSETRON HCL 4 MG PO TABS: 4.0000 mg | ORAL_TABLET | Freq: Four times a day (QID) | ORAL | Status: DC | PRN

## 2015-12-10 MED ORDER — INSULIN ASPART 100 UNIT/ML ~~LOC~~ SOLN
0.0000 [IU] | Freq: Three times a day (TID) | SUBCUTANEOUS | Status: DC
Start: 2015-12-10 — End: 2015-12-13
  Administered 2015-12-12: 1 [IU] via SUBCUTANEOUS

## 2015-12-10 MED ORDER — VITAMIN D3 25 MCG (1000 UNIT) PO TABS
3000.0000 [IU] | ORAL_TABLET | Freq: Every day | ORAL | Status: DC
  Administered 2015-12-11 – 2015-12-12 (×2): 3000 [IU] via ORAL
  Filled 2015-12-10 (×2): qty 3

## 2015-12-10 MED ORDER — LORATADINE 10 MG PO TABS: 10.0000 mg | ORAL_TABLET | Freq: Every day | ORAL | Status: DC | PRN

## 2015-12-10 MED ORDER — GABAPENTIN 100 MG PO CAPS
100.0000 mg | ORAL_CAPSULE | Freq: Two times a day (BID) | ORAL | Status: DC
  Administered 2015-12-10 – 2015-12-13 (×6): 100 mg via ORAL
  Filled 2015-12-10 (×6): qty 1

## 2015-12-10 MED ORDER — LIDOCAINE-EPINEPHRINE 1 %-1:100000 IJ SOLN: 20.0000 mL | Freq: Once | INTRAMUSCULAR | Status: DC

## 2015-12-10 MED ORDER — LEVOTHYROXINE SODIUM 100 MCG PO TABS
100.0000 ug | ORAL_TABLET | Freq: Every day | ORAL | Status: DC
  Administered 2015-12-11 – 2015-12-13 (×3): 100 ug via ORAL
  Filled 2015-12-10 (×3): qty 1

## 2015-12-10 MED ORDER — LEVOFLOXACIN IN D5W 500 MG/100ML IV SOLN
500.0000 mg | INTRAVENOUS | Status: DC
  Administered 2015-12-10: 500 mg via INTRAVENOUS
  Filled 2015-12-10: qty 100

## 2015-12-10 MED ORDER — ONDANSETRON HCL 4 MG/2ML IJ SOLN
4.0000 mg | Freq: Four times a day (QID) | INTRAMUSCULAR | Status: DC | PRN
  Administered 2015-12-11 (×2): 4 mg via INTRAVENOUS
  Filled 2015-12-10 (×2): qty 2

## 2015-12-10 MED ORDER — SODIUM CHLORIDE 0.9 % IV SOLN
300.0000 mg | Freq: Two times a day (BID) | INTRAVENOUS | Status: DC
  Administered 2015-12-10 – 2015-12-11 (×2): 300 mg via INTRAVENOUS
  Filled 2015-12-10 (×2): qty 300

## 2015-12-10 MED ORDER — ACETAMINOPHEN 650 MG RE SUPP
650.0000 mg | Freq: Four times a day (QID) | RECTAL | Status: DC | PRN
Start: 2015-12-10 — End: 2015-12-13

## 2015-12-10 MED ORDER — DIAZEPAM 5 MG PO TABS: 5.0000 mg | ORAL_TABLET | Freq: Every day | ORAL | Status: DC | PRN

## 2015-12-10 MED ORDER — ACETAMINOPHEN 325 MG PO TABS
650.0000 mg | ORAL_TABLET | Freq: Four times a day (QID) | ORAL | Status: DC | PRN
Start: 2015-12-10 — End: 2015-12-13

## 2015-12-10 MED ORDER — APIXABAN 2.5 MG PO TABS
2.5000 mg | ORAL_TABLET | Freq: Two times a day (BID) | ORAL | Status: DC
  Administered 2015-12-10 – 2015-12-13 (×6): 2.5 mg via ORAL
  Filled 2015-12-10 (×6): qty 1

## 2015-12-10 MED ORDER — PREDNISONE 5 MG PO TABS
5.0000 mg | ORAL_TABLET | Freq: Every day | ORAL | Status: DC
  Administered 2015-12-11 – 2015-12-13 (×3): 5 mg via ORAL
  Filled 2015-12-10 (×3): qty 1

## 2015-12-10 MED ORDER — CLINDAMYCIN PHOSPHATE 900 MG/50ML IV SOLN
900.0000 mg | Freq: Once | INTRAVENOUS | Status: AC
  Administered 2015-12-10: 900 mg via INTRAVENOUS
  Filled 2015-12-10: qty 50

## 2015-12-10 MED ORDER — SODIUM CHLORIDE 0.9 % IV SOLN
INTRAVENOUS | Status: DC
  Administered 2015-12-10 – 2015-12-12 (×4): via INTRAVENOUS

## 2015-12-10 MED ORDER — LIDOCAINE-EPINEPHRINE (PF) 2 %-1:200000 IJ SOLN
30.0000 mL | Freq: Once | INTRAMUSCULAR | Status: AC
  Administered 2015-12-10: 10 mL via INTRADERMAL
  Filled 2015-12-10: qty 30

## 2015-12-10 NOTE — H&P (Signed)
History and Physical    [name] [name] IWL:[id number] DOB: 04-Jan-1933 DOA: 12/10/2015  PCP: [name], MD   Patient coming from: Home  Chief Complaint: Right foot swelling and redness.  HPI: [name] [name] is a 82 y.o. female with medical history significant for diabetes mellitus type 2, who presents to hospital with the chief complaint of right foot edema and erythema. For last 4 days she has been developing worsening erythema at the right foot, started about 4 days ago at the site where she had a callus at the base of the first metatarsal joint, progressively developed worsening edema and erythema, not affecting the dorsum of her foot and expanding into her distal leg, with no improving or worsening factors, associated with intermittent sharp local pain. Over last 24 hours she has been developing nausea and postprandial vomiting.  Her serum a.m. fasting glucose tends to be below 100, she has lost about 14 pounds over last 4 months.   ED Course: IV antibiotics, lancet procedure at the base of the front foot, base of the first metatarsal joint.  Review of Systems:  1. General. No fevers or chills positive for weight loss 14 pounds unintentional 2. Cardiovascular, no anginal claudication no palpitations 3. Pulmonary no shortness of breath cough or hemoptysis 4. Gastrointestinal positive for nausea and vomiting no abdominal pain or diarrhea 5. Musculoskeletal. Positive pain at the right foot as mentioned in history present illness 6. Dermatology positive of rash right foot as mentioned present illness 7. Endocrine no tremors heat or cold intolerance 8. Urology no dysuria or frequency 9. Neurology no seizures or paresthesias 10. ENT no runny nose or sore throat  Past Medical History:  Diagnosis Date  . Arthritis    r tkr  . Atrial fibrillation ([place])   . Breast cancer ([place])   . CVA (cerebral vascular accident) ([place]) 2008   mini stroke.no residual  . DEPRESSION 05/16/2009  .  Diabetes mellitus type II   . Heart murmur    stress test 2009.dr peter Martinique  . HYPERLIPIDEMIA 07/24/2008  . HYPERTENSION 07/24/2008  . HYPOTHYROIDISM 07/24/2008  . Intermittent vertigo   . Pneumonia   . Skin abnormality    facial lesions .pt applying mupiracin to areas  . Vertigo   . Wegener's disease, pulmonary ([place]) 10/2012    Past Surgical History:  Procedure Laterality Date  . BREAST SURGERY  2007   Lumpectomy, XRT 2006.l breast  . CHOLECYSTECTOMY  1980  . EXPLORATORY LAPAROTOMY    . HEEL SPUR SURGERY Right   . JOINT REPLACEMENT     r knee  . KNEE SURGERY  2009   TKR  . VIDEO ASSISTED THORACOSCOPY  04/22/2011   Procedure: VIDEO ASSISTED THORACOSCOPY;  Surgeon: Melrose Nakayama, MD;  Location: [facility];  Service: Thoracic;  Laterality: Left;  WITH BIOPSY     reports that she quit smoking about 33 years ago. Her smoking use included Cigarettes. She has a 6.00 pack-year smoking history. She has never used smokeless tobacco. She reports that she does not drink alcohol or use drugs.  Allergies  Allergen Reactions  . Codeine Sulfate Other (See Comments)    REACTION: GI upset  . Sulfonamide Derivatives Other (See Comments)    REACTION: GI upset  . Vancomycin Other (See Comments)    Red man syndrome  . Atarax [Hydroxyzine] Nausea Only and Rash    Family History  Problem Relation Age of Onset  . Arthritis Mother   . Rheum arthritis Mother   .  Heart disease Father   . Coronary artery disease Father   . Cancer Sister     breast CA, both sisters  . Breast cancer Sister   . Coronary artery disease Brother   . Lung cancer Brother      Prior to Admission medications   Medication Sig Start Date End Date Taking? Authorizing Provider  amLODipine (NORVASC) 5 MG tablet TAKE ONE TABLET BY MOUTH ONCE DAILY 04/11/15   [name], MD  apixaban (ELIQUIS) 2.5 MG TABS tablet Take 1 tablet (2.5 mg total) by mouth 2 (two) times daily. 11/14/14   Peter M Martinique, MD  BYSTOLIC 10 MG  tablet TAKE ONE TABLET BY MOUTH ONCE DAILY AFTER  BREAKFAST. 11/26/15   [name], MD  CALCIUM CITRATE PO Take 1,200 mg by mouth daily.      Historical Provider, MD  cholecalciferol (VITAMIN D) 1000 UNITS tablet Take 3,000 Units by mouth daily.      Historical Provider, MD  diazepam (VALIUM) 5 MG tablet Take one tablet by mouth as needed for severe vertigo flares 11/15/15   [name], MD  escitalopram (LEXAPRO) 10 MG tablet Take 1 tablet (10 mg total) by mouth daily. 11/30/15   [name], MD  furosemide (LASIX) 20 MG tablet TAKE ONE TABLET BY MOUTH ONCE DAILY 03/19/15   [name], MD  gabapentin (NEURONTIN) 100 MG capsule TAKE ONE CAPSULE BY MOUTH TWICE DAILY BEFORE A MEAL. 07/03/15   [name], MD  glimepiride (AMARYL) 2 MG tablet TAKE ONE TABLET BY MOUTH ONCE DAILY BEFORE BREAKFAST 10/19/15   [name], MD  levothyroxine (SYNTHROID, LEVOTHROID) 100 MCG tablet Take 1 tablet (100 mcg total) by mouth daily before breakfast. 12/04/15   [name], MD  loratadine (CLARITIN) 10 MG tablet Take 10 mg by mouth daily as needed for allergies.    Historical Provider, MD  predniSONE (DELTASONE) 5 MG tablet Take 1 tablet (5 mg total) by mouth daily with breakfast. 07/18/15   Deneise Lever, MD  triamcinolone cream (KENALOG) 0.1 % Apply 1 application topically 2 (two) times daily as needed. 05/15/15   [name], MD  valsartan (DIOVAN) 160 MG tablet Take 1 tablet (160 mg total) by mouth daily. 10/19/15   [name], MD    Physical Exam: Vitals:   12/10/15 1149 12/10/15 1237  BP: 145/72 130/63  Pulse: 85 79  Resp: 18 20  Temp: 99.6 F (37.6 C)   TempSrc: Oral   SpO2: 91% 94%      Constitutional: Deconditioned and ill-looking appearing Vitals:   12/10/15 1149 12/10/15 1237  BP: 145/72 130/63  Pulse: 85 79  Resp: 18 20  Temp: 99.6 F (37.6 C)   TempSrc: Oral   SpO2: 91% 94%   Eyes: PERRL, lids and conjunctivae Mild pallor Nose and  ears no deformities ENMT: Mucous membranes are dry. Posterior pharynx clear of any exudate or lesions.Normal dentition.  Neck: normal, supple, no masses, no thyromegaly Respiratory: Mild decreased breath sounds at bases, no wheezing, no crackles. Normal respiratory effort. No accessory muscle use.  Cardiovascular: Regular rate and rhythm, positive systolic murmur 3/6 at the apex, with radiation to the axilla, no / rubs / gallops. No extremity edema. 2+ pedal pulses. No carotid bruits.  Abdomen: no tenderness, no masses palpated. No hepatosplenomegaly. Bowel sounds positive.  Musculoskeletal: no clubbing / cyanosis. No joint deformity upper and lower extremities. Good ROM, no contractures. Normal muscle tone.  Skin:  Positive erythema on the dorsum of the right foot with infant did express into the distal right lower extremity. At the base of the foot, base of the first metatarsal, open wound with thick sanguinous drainage. No significant tenderness to palpation Neurologic: CN 2-12 grossly intact. Sensation intact, DTR normal. Strength 5/5 in all 4.      Labs on Admission: I have personally reviewed following labs and imaging studies  CBC:  Recent Labs Lab 12/10/15 1206  WBC 13.6*  NEUTROABS 10.9*  HGB 10.3*  HCT 31.2*  MCV 91.8  PLT 419   Basic Metabolic Panel:  Recent Labs Lab 12/10/15 1206  NA 130*  K 4.4  CL 97*  CO2 24  GLUCOSE 108*  BUN 20  CREATININE 1.58*  CALCIUM 8.8*   GFR: Estimated Creatinine Clearance: 30.6 mL/min (by C-G formula based on SCr of 1.58 mg/dL). Liver Function Tests:  Recent Labs Lab 12/10/15 1206  AST 21  ALT 10*  ALKPHOS 61  BILITOT 1.7*  PROT 7.7  ALBUMIN 3.7   No results for input(s): LIPASE, AMYLASE in the last 168 hours. No results for input(s): AMMONIA in the last 168 hours. Coagulation Profile: No results for input(s): INR, PROTIME in the last 168 hours. Cardiac Enzymes: No results for input(s): CKTOTAL, CKMB, CKMBINDEX,  TROPONINI in the last 168 hours. BNP (last 3 results) No results for input(s): PROBNP in the last 8760 hours. HbA1C: No results for input(s): HGBA1C in the last 72 hours. CBG: No results for input(s): GLUCAP in the last 168 hours. Lipid Profile: No results for input(s): CHOL, HDL, LDLCALC, TRIG, CHOLHDL, LDLDIRECT in the last 72 hours. Thyroid Function Tests: No results for input(s): TSH, T4TOTAL, FREET4, T3FREE, THYROIDAB in the last 72 hours. Anemia Panel: No results for input(s): VITAMINB12, FOLATE, FERRITIN, TIBC, IRON, RETICCTPCT in the last 72 hours. Urine analysis:    Component Value Date/Time   COLORURINE AMBER (A) 09/25/2014 1550   APPEARANCEUR CLOUDY (A) 09/25/2014 1550   LABSPEC 1.015 09/25/2014 1550   PHURINE 6.5 09/25/2014 1550   GLUCOSEU NEGATIVE 09/25/2014 1550   HGBUR MODERATE (A) 09/25/2014 1550   BILIRUBINUR NEGATIVE 09/25/2014 1550   BILIRUBINUR n 12/30/2010 1700   KETONESUR NEGATIVE 09/25/2014 1550   PROTEINUR 100 (A) 09/25/2014 1550   UROBILINOGEN 1.0 09/25/2014 1550   NITRITE NEGATIVE 09/25/2014 1550   LEUKOCYTESUR MODERATE (A) 09/25/2014 1550   Sepsis Labs: !!!!!!!!!!!!!!!!!!!!!!!!!!!!!!!!!!!!!!!!!!!! _0 (procalcitonin:4,lacticidven:4) )No results found for this or any previous visit (from the past 240 hour(s)).   Radiological Exams on Admission: Dg Foot Complete Right  Result Date: 12/10/2015 CLINICAL DATA:  Cellulitis question infection, RIGHT foot ulcer, redness, pain and inflammation for 4 days EXAM: RIGHT FOOT COMPLETE - 3+ VIEW COMPARISON:  None FINDINGS: Marked osseous demineralization. Scattered small vessel vascular calcification. Scattered soft tissue swelling. Joint spaces preserved. Tiny plantar calcaneal spur. No acute fracture, dislocation, or bone destruction. IMPRESSION: Osseous demineralization and soft tissue swelling without definite acute bony abnormalities. If there is persistent clinical concern for occult osteomyelitis,  recommend MR imaging followup. Electronically Signed   By: Lavonia Dana M.D.   On: 12/10/2015 12:49    EKG: Independently reviewed.  Assessment/Plan Active Problems:   Diabetic foot ulcer (HCC)   Cellulitis and abscess of foot   This is a 82 year old female with history of diabetes and atrial fibrillation, anticoagulated with apixaban who presents with right foot edema, erythema and a small abscess at the base of the first metatarsal joint. Her symptoms have been ongoing and worsening  for last 4 days. The erythema is expanding into the right distal leg. On physical examination she is ill looking appearing, her blood pressure is 130/63, heart rate is 79, respiratory rate is 20 with an oxygen saturation 94% on room air. Her right foot has edema and erythema, lymphangitic spread into the distal right lower extremity, small abscess at the base of the first metatarsal has been open, with thick sanguinous drainage. Her sodium is 130, potassium is 4.4, creatinine 1.58, BUN of 20, serum bicarbonate 24, white count 13.6, hemoglobin 10.3 with hematocrit 31.2, platelet count 253. Lactic acid 1.5 X-rays showed soft tissue swelling without definite acute bony abnormalities.   Working diagnosis. Right foot worsening cellulitis.  1. Right foot cellulitis. Will start patient with a broad spectrum antibiotics including IV vancomycin and Zosyn. Patient is diabetic and elderly. Will start patient on supportive IV fluids, analgesics and antiemetics. Will follow-up on blood cultures, temperature curve and cell count. Will reassess wound in 24 hours, if no improvement may need further imaging and possibly surgical consultation. Will order wound care.   2. Hypertension. Will hold on amlodipine, bysystolic, valsartan and furosemide to avoid hypotension. Patient will be gently hydrated with normal saline at 75 ml per hour.  3. Atrial fibrillation. Will continue anticoagulation with apixaban, not on any rate controlling  agents. Will place patient on a remote telemetry monitoring for the first 24 hours due to risk of uncontrolled atrial fibrillation.  4. Diabetes mellitus. Will hold outpatient oral hypoglycemic agents. Will place patient on insulin sliding scale and will calculate insulin requirements before starting basal long-acting insulin. Carbohydrate restricted diet. Continue home dose of prednisone. Continue gabapentin.  5. Hypothyroidism. Continue levothyroxine at her her home regimen of 100 mcg daily.  6. Depression. Continue diazepam  DVT prophylaxis: xarelto  Code Status: full  Family Communication: I spoke with patient's family at the bedside and all questions were addressed, key information for patient's care was obtained.   Disposition Plan: home  Consults called: none  Admission status: Inpatient   Mauricio Gerome Apley MD [facility] Pager [phone]  If 7PM-7AM, please contact night-coverage [email or web address] Password [email or web address]  12/10/2015, 2:00 PM

## 2015-12-10 NOTE — ED Notes (Signed)
RN on phone with pharmacy as no Lido/Epi available through any ED [facility].  Lido 1:200000 Epi.  Rn will notify EDP

## 2015-12-10 NOTE — ED Triage Notes (Signed)
Pt presents with right foot ulcer, redness, pain, and inflammation. Pt has hx of Type II Diabetes. Pt denies fevers at home. Pt denies injury to right foot. Pt A+OX4, speaking in complete sentences.

## 2015-12-10 NOTE — ED Provider Notes (Signed)
[facility] DEPT Provider Note   CSN: [id number] Arrival date & time: 12/10/15  1134     History   Chief Complaint Chief Complaint  Patient presents with  . Foot Ulcer    HPI [name] [name] is a 82 y.o. female.  82 yo F with a chief complaint of redness and swelling to the right lower extremity. Going on for the past week or so and worsening. Patient having some nausea denies fevers chills. Denies shortness of breath. Patient was seen by her family physician prior to this and was told that she had a corn that would likely get better on its own. Has a history of poorly controlled diabetes.   The history is provided by the patient.  Leg Pain   This is a new problem. The current episode started less than 1 hour ago. The problem occurs constantly. The problem has not changed since onset.The pain is present in the right foot. The quality of the pain is described as dull. The pain is at a severity of 7/10. The pain is moderate. She has tried nothing for the symptoms. The treatment provided no relief. There has been no history of extremity trauma.    Past Medical History:  Diagnosis Date  . Arthritis    r tkr  . Atrial fibrillation ([place])   . Breast cancer ([place])   . CVA (cerebral vascular accident) ([place]) 2008   mini stroke.no residual  . DEPRESSION 05/16/2009  . Diabetes mellitus type II   . Heart murmur    stress test 2009.dr peter Martinique  . HYPERLIPIDEMIA 07/24/2008  . HYPERTENSION 07/24/2008  . HYPOTHYROIDISM 07/24/2008  . Intermittent vertigo   . Pneumonia   . Skin abnormality    facial lesions .pt applying mupiracin to areas  . Vertigo   . Wegener's disease, pulmonary ([place]) 10/2012    Patient Active Problem List   Diagnosis Date Noted  . Diabetic foot ulcer ([place]) 12/10/2015  . Cellulitis and abscess of foot 12/10/2015  . AKI (acute kidney injury) ([place]) 09/25/2014  . Hereditary and idiopathic peripheral neuropathy 09/25/2014  . Dependent edema 09/25/2014  . Acute kidney  injury ([place])   . UTI (lower urinary tract infection)   . Type 2 diabetes mellitus, controlled ([place]) 07/19/2014  . Acute on chronic respiratory failure ([place]) 04/04/2014  . Chronic atrial fibrillation ([place]) 04/04/2014  . CKD (chronic kidney disease) stage 3, GFR 30-59 ml/min 04/04/2014  . Abdominal hernia without obstruction or gangrene 01/09/2014  . Nausea alone 08/21/2013  . CAP (community acquired pneumonia) 08/18/2013  . Diastolic CHF, chronic ([place]) 08/18/2013  . COPD (chronic obstructive pulmonary disease) ([place]) 08/18/2013  . Sepsis ([place]) 08/18/2013  . Hand pain, left 08/18/2013  . COPD with acute exacerbation ([place]) 05/10/2013  . Chronic kidney disease (CKD), stage IV (severe) ([place]) 05/08/2013  . Normocytic anemia 05/08/2013  . Acute on chronic diastolic heart failure ([place]) 05/07/2013  . Pulmonary hypertension ([place]) 05/07/2013  . Right-sided heart failure ([place]) 05/07/2013  . Pancreatic cyst 04/17/2013  . Acute respiratory failure ([place]) 04/13/2013  . Obesity (BMI 30-39.9) 12/28/2012  . Wegener's granulomatosis (granulomatosis with polyangiitis) ([place]) 11/02/2012  . Abnormal echocardiogram 11/02/2012  . Cough with hemoptysis 11/01/2012  . Anemia 11/01/2012  . Dehydration 11/01/2012  . Acute renal failure ([place]) 11/01/2012  . Diffuse pulmonary alveolar hemorrhage 11/01/2012  . Breast cancer ([place]) 09/14/2012  . Pruritus of skin 05/20/2012  . 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 Cryptogenic organizing pneumonia 04/05/2011  .  Vertigo, intermittent 10/21/2010  . Type 2 diabetes mellitus ([place]) 06/04/2010  . ABDOMINAL PAIN, LEFT LOWER QUADRANT 04/18/2010  . Edema 03/25/2010  . Hyponatremia 02/13/2010  . DEPRESSION 05/16/2009  . ABDOMINAL PAIN 03/08/2009  . Hypothyroidism 07/24/2008  . Hyperlipidemia 07/24/2008  . Essential hypertension 07/24/2008  . OSTEOARTHRITIS 07/24/2008    Past Surgical History:  Procedure Laterality Date  . BREAST SURGERY  2007    Lumpectomy, XRT 2006.l breast  . CHOLECYSTECTOMY  1980  . EXPLORATORY LAPAROTOMY    . HEEL SPUR SURGERY Right   . JOINT REPLACEMENT     r knee  . KNEE SURGERY  2009   TKR  . VIDEO ASSISTED THORACOSCOPY  04/22/2011   Procedure: VIDEO ASSISTED THORACOSCOPY;  Surgeon: Melrose Nakayama, MD;  Location: [facility];  Service: Thoracic;  Laterality: Left;  WITH BIOPSY    OB History    No data available       Home Medications    Prior to Admission medications   Medication Sig Start Date End Date Taking? Authorizing Provider  amLODipine (NORVASC) 5 MG tablet TAKE ONE TABLET BY MOUTH ONCE DAILY Patient taking differently: TAKE 26m TABLET BY MOUTH ONCE DAILY 04/11/15  Yes BEulas Post MD  apixaban (ELIQUIS) 2.5 MG TABS tablet Take 1 tablet (2.5 mg total) by mouth 2 (two) times daily. 11/14/14  Yes Peter M JMartinique MD  BYSTOLIC 10 MG tablet TAKE ONE TABLET BY MOUTH ONCE DAILY AFTER  BREAKFAST. Patient taking differently: TAKE 120mTABLET BY MOUTH ONCE DAILY AFTER  BREAKFAST. 11/26/15  Yes BrEulas PostMD  CALCIUM CITRATE PO Take 1,200 mg by mouth daily with lunch.    Yes Historical Provider, MD  cholecalciferol (VITAMIN D) 1000 UNITS tablet Take 3,000 Units by mouth daily with lunch.    Yes Historical Provider, MD  diazepam (VALIUM) 5 MG tablet Take one tablet by mouth as needed for severe vertigo flares Patient taking differently: Take 5 mg by mouth daily as needed (severe vertigo..)Marland Kitchen  11/15/15  Yes BrEulas PostMD  furosemide (LASIX) 20 MG tablet TAKE ONE TABLET BY MOUTH ONCE DAILY Patient taking differently: TAKE 2074mABLET BY MOUTH ONCE DAILY 03/19/15  Yes BruEulas PostD  gabapentin (NEURONTIN) 100 MG capsule TAKE ONE CAPSULE BY MOUTH TWICE DAILY BEFORE A MEAL. Patient taking differently: TAKE 100m15mPSULE BY MOUTH TWICE DAILY BEFORE A MEAL. 07/03/15  Yes BrucEulas Post  glimepiride (AMARYL) 2 MG tablet TAKE ONE TABLET BY MOUTH ONCE DAILY BEFORE BREAKFAST Patient  taking differently: Take 2 mg by mouth daily with breakfast. TAKE ONE TABLET BY MOUTH ONCE DAILY BEFORE BREAKFAST 10/19/15  Yes BrucEulas Post  levothyroxine (SYNTHROID, LEVOTHROID) 100 MCG tablet Take 1 tablet (100 mcg total) by mouth daily before breakfast. 12/04/15  Yes BrucEulas Post  loratadine (CLARITIN) 10 MG tablet Take 10 mg by mouth daily as needed for allergies.   Yes Historical Provider, MD  predniSONE (DELTASONE) 5 MG tablet Take 1 tablet (5 mg total) by mouth daily with breakfast. 07/18/15  Yes ClinDeneise Lever  triamcinolone cream (KENALOG) 0.1 % Apply 1 application topically 2 (two) times daily as needed. Patient taking differently: Apply 1 application topically 2 (two) times daily as needed (rash/irritation).  05/15/15  Yes BrucEulas Post  valsartan (DIOVAN) 160 MG tablet Take 1 tablet (160 mg total) by mouth daily. Patient taking differently: Take 160 mg by mouth every evening.  10/19/15  Yes Bruce   Europe, MD  escitalopram (LEXAPRO) 10 MG tablet Take 1 tablet (10 mg total) by mouth daily. Patient not taking: Reported on 12/10/2015 11/30/15   [name], MD    Family History Family History  Problem Relation Age of Onset  . Arthritis Mother   . Rheum arthritis Mother   . Heart disease Father   . Coronary artery disease Father   . Cancer Sister     breast CA, both sisters  . Breast cancer Sister   . Coronary artery disease Brother   . Lung cancer Brother     Social History Social History  Substance Use Topics  . Smoking status: Former Smoker    Packs/day: 0.50    Years: 12.00    Types: Cigarettes    Quit date: 06/04/1982  . Smokeless tobacco: Never Used  . Alcohol use No     Allergies   Codeine sulfate; Sulfonamide derivatives; Vancomycin; and Atarax [hydroxyzine]   Review of Systems Review of Systems  Constitutional: Negative for chills and fever.  HENT: Negative for congestion and rhinorrhea.   Eyes: Negative for redness and  visual disturbance.  Respiratory: Negative for shortness of breath and wheezing.   Cardiovascular: Positive for leg swelling. Negative for chest pain and palpitations.  Gastrointestinal: Negative for nausea and vomiting.  Genitourinary: Negative for dysuria and urgency.  Musculoskeletal: Negative for arthralgias and myalgias.  Skin: Negative for pallor and wound.  Neurological: Negative for dizziness and headaches.     Physical Exam Updated Vital Signs BP 119/69 (BP Location: Right Arm)   Pulse 76   Temp 99.6 F (37.6 C) (Oral)   Resp 20   SpO2 100%   Physical Exam  Constitutional: She is oriented to person, place, and time. She appears well-developed and well-nourished. No distress.  HENT:  Head: Normocephalic and atraumatic.  Eyes: EOM are normal. Pupils are equal, round, and reactive to light.  Neck: Normal range of motion. Neck supple.  Cardiovascular: Normal rate and regular rhythm.  Exam reveals no gallop and no friction rub.   No murmur heard. Pulmonary/Chest: Effort normal. She has no wheezes. She has no rales.  Abdominal: Soft. She exhibits no distension. There is no tenderness.  Musculoskeletal: She exhibits no edema or tenderness.  Neurological: She is alert and oriented to person, place, and time.  Skin: Skin is warm and dry. Rash noted. She is not diaphoretic. There is erythema.  Patient has what appears to be a abscess to the plantar aspect of the first MTP. Erythema that extends up the foot and up to the calf. Patient has red streaking up to her knee.  Psychiatric: She has a normal mood and affect. Her behavior is normal.  Nursing note and vitals reviewed.    ED Treatments / Results  Labs (all labs ordered are listed, but only abnormal results are displayed) Labs Reviewed  COMPREHENSIVE METABOLIC PANEL - Abnormal; Notable for the following:       Result Value   Sodium 130 (*)    Chloride 97 (*)    Glucose, Bld 108 (*)    Creatinine, Ser 1.58 (*)     Calcium 8.8 (*)    ALT 10 (*)    Total Bilirubin 1.7 (*)    GFR calc non Af Amer 30 (*)    GFR calc Af Amer 34 (*)    All other components within normal limits  CBC WITH DIFFERENTIAL/PLATELET - Abnormal; Notable for the following:    WBC 13.6 (*)  RBC 3.40 (*)    Hemoglobin 10.3 (*)    HCT 31.2 (*)    Neutro Abs 10.9 (*)    Monocytes Absolute 1.5 (*)    All other components within normal limits  URINALYSIS, ROUTINE W REFLEX MICROSCOPIC (NOT AT [facility]) - Abnormal; Notable for the following:    Color, Urine AMBER (*)    APPearance CLOUDY (*)    Hgb urine dipstick SMALL (*)    Bilirubin Urine SMALL (*)    Protein, ur 30 (*)    Leukocytes, UA LARGE (*)    All other components within normal limits  URINE MICROSCOPIC-ADD ON - Abnormal; Notable for the following:    Squamous Epithelial / LPF TOO NUMEROUS TO COUNT (*)    Bacteria, UA FEW (*)    All other components within normal limits  CBG MONITORING, ED - Abnormal; Notable for the following:    Glucose-Capillary 105 (*)    All other components within normal limits  CULTURE, BLOOD (ROUTINE X 2)  CULTURE, BLOOD (ROUTINE X 2)  URINE CULTURE  I-STAT CG4 LACTIC ACID, ED  I-STAT CG4 LACTIC ACID, ED    EKG  EKG Interpretation None       Radiology Dg Foot Complete Right  Result Date: 12/10/2015 CLINICAL DATA:  Cellulitis question infection, RIGHT foot ulcer, redness, pain and inflammation for 4 days EXAM: RIGHT FOOT COMPLETE - 3+ VIEW COMPARISON:  None FINDINGS: Marked osseous demineralization. Scattered small vessel vascular calcification. Scattered soft tissue swelling. Joint spaces preserved. Tiny plantar calcaneal spur. No acute fracture, dislocation, or bone destruction. IMPRESSION: Osseous demineralization and soft tissue swelling without definite acute bony abnormalities. If there is persistent clinical concern for occult osteomyelitis, recommend MR imaging followup. Electronically Signed   By: Lavonia Dana M.D.   On: 12/10/2015  12:49    Procedures .Marland KitchenIncision and Drainage Date/Time: 12/10/2015 3:05 PM Performed by: Tyrone Nine Elyce Zollinger Authorized by: Deno Etienne   Consent:    Consent obtained:  Verbal   Consent given by:  Patient   Risks discussed:  Bleeding, incomplete drainage and infection   Alternatives discussed:  No treatment Location:    Type:  Abscess   Size:  2cm   Location:  Lower extremity   Lower extremity location:  Foot   Foot location:  R foot Pre-procedure details:    Skin preparation:  Chloraprep Anesthesia (see MAR for exact dosages):    Anesthesia method:  Local infiltration   Local anesthetic:  Lidocaine 1% WITH epi Procedure type:    Complexity:  Complex Procedure details:    Needle aspiration: no     Incision types:  Single straight   Incision depth:  Subcutaneous   Scalpel blade:  11   Wound management:  Probed and deloculated   Drainage:  Bloody and purulent   Drainage amount:  Copious   Wound treatment:  Wound left open   Packing materials:  None Post-procedure details:    Patient tolerance of procedure:  Tolerated well, no immediate complications   (including critical care time)  Medications Ordered in ED Medications  clindamycin (CLEOCIN) IVPB 900 mg (0 mg Intravenous Stopped 12/10/15 1339)  lidocaine-EPINEPHrine (XYLOCAINE W/EPI) 2 %-1:200000 (PF) injection 30 mL (10 mLs Intradermal Given 12/10/15 1322)     Initial Impression / Assessment and Plan / ED Course  I have reviewed the triage vital signs and the nursing notes.  Pertinent labs & imaging results that were available during my care of the patient were reviewed by me and considered  in my medical decision making (see chart for details).  Clinical Course    82 yo F With a chief complaints of right foot pain and swelling. Likely cellulitis with extension. Due to extent in patient having nausea likely admission. Will give IV clindamycin.  I&D at bedside.  Admit.  The patients results and plan were reviewed and  discussed.   Any x-rays performed were independently reviewed by myself.   Differential diagnosis were considered with the presenting HPI.  Medications  clindamycin (CLEOCIN) IVPB 900 mg (0 mg Intravenous Stopped 12/10/15 1339)  lidocaine-EPINEPHrine (XYLOCAINE W/EPI) 2 %-1:200000 (PF) injection 30 mL (10 mLs Intradermal Given 12/10/15 1322)    Vitals:   12/10/15 1149 12/10/15 1237 12/10/15 1445  BP: 145/72 130/63 119/69  Pulse: 85 79 76  Resp: _0 Temp: 99.6 F (37.6 C)    TempSrc: Oral    SpO2: 91% 94% 100%    Final diagnoses:  Diabetic foot infection (HCC)  Cellulitis and abscess    Admission/ observation were discussed with the admitting physician, patient and/or family and they are comfortable with the plan.    Final Clinical Impressions(s) / ED Diagnoses   Final diagnoses:  Diabetic foot infection ([place])  Cellulitis and abscess    New Prescriptions New Prescriptions   No medications on file     Deno Etienne, DO 12/10/15 1507

## 2015-12-10 NOTE — Progress Notes (Signed)
Pharmacy Antibiotic Note  [name] [name] is a 82 y.o. female with PMHx diabetes, Afib on chronic apixaban, CVA, hypothyroidism, and Wegener's disease admitted on 12/10/2015 with worsening right foot swelling and redness.  MD initially ordered Vancomycin and Zosyn but pt has allergy to Vancomycin (red man syndrome).  After discussion with MD and ID, Pharmacy has been consulted for Ceftaroline dosing for purulent, severe cellulitis.  Pt received clindamycin x 1 in ED.  CrCl ~30 ml/min   Plan: Ceftaroline 355m IV q12h.  Monitor renal function closely to adjust dose.     Height: 5' 6" (167.6 cm) Weight: 178 lb 3.2 oz (80.8 kg) IBW/kg (Calculated) : 59.3  Temp (24hrs), Avg:99.3 F (37.4 C), Min:98.9 F (37.2 C), Max:99.6 F (37.6 C)   Recent Labs Lab 12/10/15 1206 12/10/15 1237  WBC 13.6*  --   CREATININE 1.58*  --   LATICACIDVEN  --  1.55    Estimated Creatinine Clearance: 29.9 mL/min (by C-G formula based on SCr of 1.58 mg/dL).    Allergies  Allergen Reactions  . Codeine Sulfate Other (See Comments)    REACTION: GI upset  . Sulfonamide Derivatives Other (See Comments)    REACTION: GI upset  . Vancomycin Other (See Comments)    Red man syndrome  . Atarax [Hydroxyzine] Nausea Only and Rash    Antimicrobials this admission: 9/4 Clindamycin x1 9/4 Ceftaroline >>   Dose adjustments this admission:   Microbiology results: 9/4 BCx: Collected 9/4 UCx: Collected   Thank you for allowing pharmacy to be a part of this patient's care.  CRalene Bathe PharmD, BCPS 12/10/2015, 4:41 PM  Pager: [phone]

## 2015-12-10 NOTE — ED Notes (Signed)
Pt unable to provide urine sample at this time 

## 2015-12-11 ENCOUNTER — Telehealth: Payer: Self-pay | Admitting: *Deleted

## 2015-12-11 DIAGNOSIS — L02619 Cutaneous abscess of unspecified foot: Secondary | ICD-10-CM

## 2015-12-11 DIAGNOSIS — L03119 Cellulitis of unspecified part of limb: Secondary | ICD-10-CM

## 2015-12-11 LAB — COMPREHENSIVE METABOLIC PANEL
ALK PHOS: 56 U/L (ref 38–126)
ALT: 10 U/L — AB (ref 14–54)
ANION GAP: 8 (ref 5–15)
AST: 19 U/L (ref 15–41)
Albumin: 3.2 g/dL — ABNORMAL LOW (ref 3.5–5.0)
BILIRUBIN TOTAL: 1.6 mg/dL — AB (ref 0.3–1.2)
BUN: 19 mg/dL (ref 6–20)
CALCIUM: 8.4 mg/dL — AB (ref 8.9–10.3)
CO2: 22 mmol/L (ref 22–32)
CREATININE: 1.26 mg/dL — AB (ref 0.44–1.00)
Chloride: 100 mmol/L — ABNORMAL LOW (ref 101–111)
GFR, EST AFRICAN AMERICAN: 45 mL/min — AB (ref >60)
GFR, EST NON AFRICAN AMERICAN: 39 mL/min — AB (ref >60)
Glucose, Bld: 61 mg/dL — ABNORMAL LOW (ref 65–99)
Potassium: 3.8 mmol/L (ref 3.5–5.1)
SODIUM: 130 mmol/L — AB (ref 135–145)
TOTAL PROTEIN: 7 g/dL (ref 6.5–8.1)

## 2015-12-11 LAB — CBC
HCT: 29.6 % — ABNORMAL LOW (ref 36.0–46.0)
HEMOGLOBIN: 9.8 g/dL — AB (ref 12.0–15.0)
MCH: 30.2 pg (ref 26.0–34.0)
MCHC: 33.1 g/dL (ref 30.0–36.0)
MCV: 91.1 fL (ref 78.0–100.0)
PLATELETS: 245 10*3/uL (ref 150–400)
RBC: 3.25 MIL/uL — AB (ref 3.87–5.11)
RDW: 13.6 % (ref 11.5–15.5)
WBC: 10.1 10*3/uL (ref 4.0–10.5)

## 2015-12-11 LAB — GLUCOSE, CAPILLARY
GLUCOSE-CAPILLARY: 84 mg/dL (ref 65–99)
GLUCOSE-CAPILLARY: 91 mg/dL (ref 65–99)
GLUCOSE-CAPILLARY: 131 mg/dL — AB (ref 65–99)
GLUCOSE-CAPILLARY: 112 mg/dL — AB (ref 65–99)

## 2015-12-11 LAB — URINE CULTURE: CULTURE: NO GROWTH

## 2015-12-11 MED ORDER — SODIUM CHLORIDE 0.9 % IV SOLN
400.0000 mg | Freq: Two times a day (BID) | INTRAVENOUS | Status: DC
  Administered 2015-12-11 – 2015-12-13 (×4): 400 mg via INTRAVENOUS
  Filled 2015-12-11 (×5): qty 400

## 2015-12-11 NOTE — Plan of Care (Signed)
Problem: Education: Goal: Knowledge of [facility] Education information/materials will improve Outcome: Completed/Met Date Met: 12/11/15 PLAN OF CARE DISCUSSED AT BEDSIDE WITH PT/FAMILY MBR. UNDERSTANDING VERBALIZED

## 2015-12-11 NOTE — Progress Notes (Signed)
Pharmacy Antibiotic Note  [name] [name] is a 82 y.o. female with PMHx diabetes, Afib on chronic apixaban, CVA, hypothyroidism, and Wegener's disease admitted on 12/10/2015 with worsening right foot swelling and redness.  MD initially ordered Vancomycin and Zosyn but pt has allergy to Vancomycin (red man syndrome).  After discussion with MD and ID, Pharmacy has been consulted for Ceftaroline dosing for purulent, severe cellulitis.  Pt received clindamycin x 1 in ED.  CrCl ~40 ml/min (normalized) with SCr improved to 1.26 this am - Xray R foot: cannot rule out osteomyelitis  Plan: Ceftaroline 325m IV q12h begun 9/4.  Monitor renal function closely to adjust dose.   Increase Ceftaroline to 4075mIV q12 with improved renal function Levaquin 50087mV q24 added last pm, rounding MD has no additional information why begun Discontinue Levaquin   Height: 5' 6" (167.6 cm) Weight: 178 lb 3.2 oz (80.8 kg) IBW/kg (Calculated) : 59.3  Temp (24hrs), Avg:99 F (37.2 C), Min:98.2 F (36.8 C), Max:99.6 F (37.6 C)   Recent Labs Lab 12/10/15 1206 12/10/15 1237 12/11/15 0517  WBC 13.6*  --  10.1  CREATININE 1.58*  --  1.26*  LATICACIDVEN  --  1.55  --     Estimated Creatinine Clearance: 37.5 mL/min (by C-G formula based on SCr of 1.26 mg/dL).    Allergies  Allergen Reactions  . Codeine Sulfate Other (See Comments)    REACTION: GI upset  . Sulfonamide Derivatives Other (See Comments)    REACTION: GI upset  . Vancomycin Other (See Comments)    Red man syndrome  . Atarax [Hydroxyzine] Nausea Only and Rash    Antimicrobials this admission: 9/4 Clindamycin x1 9/4 Ceftaroline >>   Dose adjustments this admission: Ceftaroline incr to 400m62m q12  Microbiology results: 9/4 BCx: ngtd 9/4 UCx: ngtd   Thank you for allowing pharmacy to be a part of this patient's care.  GreeMinda DittormD Pager [phone]/2017, 10:43 AM

## 2015-12-11 NOTE — Telephone Encounter (Signed)
Patient did check into the ED and was admitted.   --------------------------------------------------- PLEASE NOTE: All timestamps contained within this report are represented as [place] Standard Time. CONFIDENTIALTY NOTICE: This fax transmission is intended only for the addressee. It contains information that is legally privileged, confidential or otherwise protected from use or disclosure. If you are not the intended recipient, you are strictly prohibited from reviewing, disclosing, copying using or disseminating any of this information or taking any action in reliance on or regarding this information. If you have received this fax in error, please notify us immediately by telephone so that we can arrange for its return to [place]. Phone: [phone], Toll-Free: [phone], Fax: [phone] Page: 1 of 2 Call Id: [id number] [facility] Night - Client [place] Patient Name: SALLI BODIN Gender: Female DOB: 1933/01/20 Age: 4 Y 28 M 13 D Return Phone Number: [phone] (Primary) Address: City/State/Zip: [place] Client [place] Night - Client Client Site [facility] - Night Physician Carolann Littler - MD Contact Type Call Who Is Calling Patient / Member / Family / Caregiver Call Type Triage / Clinical Relationship To Patient Self Return Phone Number [phone] (Primary) Chief Complaint Vomiting Reason for Call Symptomatic / Request for Health Information Initial Comment Caller states c/o red sore area on ball of foot, nausea, and vomiting. She is diabetic. PreDisposition Go to ED Translation No Nurse Assessment Nurse: Thad Ranger, RN, Langley Gauss Date/Time (Eastern Time): 12/10/2015 9:42:33 AM Confirm and document reason for call. If symptomatic, describe symptoms. You must click the next button to save text entered. ---Caller states c/o red sore area on ball of foot, big toe, and second  toe. She also has had nausea, and diarrhea x 1 wk.. She is diabetic. Has the patient traveled out of the country within the last 30 days? ---Not Applicable Does the patient have any new or worsening symptoms? ---Yes Will a triage be completed? ---Yes Related visit to physician within the last 2 weeks? ---Yes Does the PT have any chronic conditions? (i.e. diabetes, asthma, etc.) ---Yes List chronic conditions. ---Diabetes, CHF, Waggoner's Syn Is this a behavioral health or substance abuse call? ---No Guidelines Guideline Title Affirmed Question Affirmed Notes Nurse Date/Time (Eastern Time) Toe Pain [1] SEVERE pain (e.g., excruciating, unable to do any normal activities) AND [2] not improved after 2 hours of pain medicine Carmon, RN, Langley Gauss 12/10/2015 9:44:27 AM Disp. Time Eilene Ghazi Time) Disposition Final User 12/10/2015 9:24:12 AM Send To Clinical Follow Up Rich Brave, Amy PLEASE NOTE: All timestamps contained within this report are represented as [place] Standard Time. CONFIDENTIALTY NOTICE: This fax transmission is intended only for the addressee. It contains information that is legally privileged, confidential or otherwise protected from use or disclosure. If you are not the intended recipient, you are strictly prohibited from reviewing, disclosing, copying using or disseminating any of this information or taking any action in reliance on or regarding this information. If you have received this fax in error, please notify us immediately by telephone so that we can arrange for its return to [place]. Phone: [phone], Toll-Free: [phone], Fax: [phone] Page: 2 of 2 Call Id: [id number] 12/10/2015 9:47:08 AM See Physician within 4 Hours (or PCP triage) Yes Carmon, RN, Yevette Edwards Understands: Yes Disagree/Comply: Comply Care Advice Given Per Guideline SEE PHYSICIAN WITHIN 4 HOURS (or PCP triage): PAIN MEDICINES: ACETAMINOPHEN (E.G., TYLENOL): * Another choice is to take 1,000 mg  (two 500 mg pills) every 8 hours as needed. Each  Extra Strength Tylenol pill has 500 mg of acetaminophen. The most you should take each day is 3,000 mg (6 Extra Strength pills a day). CALL BACK IF: * You become worse. CARE ADVICE given per Toe Pain (Adult) guideline. Comments User: Romeo Apple, RN Date/Time Eilene Ghazi Time): 12/10/2015 9:47:06 AM Advised to keep her foot elevated above heart Referrals [facility] - ED

## 2015-12-11 NOTE — Progress Notes (Signed)
Patient ID: [name] [name], female   DOB: [birth year], 82 y.o.   MRN: [id number]    PROGRESS NOTE    [name] [name]  [id number]:[id number] DOB: January 11, 1933 DOA: 12/10/2015  PCP: [name], MD   Brief Narrative:  82 y.o. female with medical history significant for diabetes mellitus type 2, who presented to hospital with the chief complaint of right foot edema and erythema,. Four days in duration.   Assessment & Plan:   1. Right foot cellulitis. Underwent I&D in ED, foot look better today, continue ABX ceftareline day #2, reassess wound in AM. Keep extremity elevated.   2. Hypertension, essential. amlodipine, bysystolic, valsartan and furosemide held on admission, BP stable today, will likely be able to resume meds in AM.   3. Atrial fibrillation. Continue apixaban, not on any rate controlling agents. Keep on tele for now.   4. 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 Hypothyroidism. Continue levothyroxine at her her home regimen of 100 mcg daily.  6. Depression. Continue diazepam  7. Chronic kidney diease stage III - baseline Cr in the past year 1.5 - 1.8. Cr currently at baseline.   8. Hyponatremia - mild, monitor    DVT prophylaxis: Apixaban Code Status: Full  Family Communication: Patient at bedside  Disposition Plan: not ready yet   Consultants:   None   Procedures:   I&D 9/4 right foot abscess in ED -->  Antimicrobials:   Ceftareline 9/4 -->   Subjective: No events overnight   Objective: Vitals:   12/10/15 1549 12/10/15 2104 12/11/15 0455 12/11/15 1500  BP: (!) 126/53 140/68 (!) 141/71 (!) 142/81  Pulse: 76 71 (!) 57 60  Resp: _0 Temp: 98.9 F (37.2 C) 99.4 F (37.4 C) 98.2 F (36.8 C) (P) 98.7 F (37.1 C)  TempSrc: Oral Oral Oral Oral  SpO2: 100% 97% 95% (P) 96%  Weight: 80.8 kg (178 lb 3.2 oz)     Height: _1  (1.676 m)       Intake/Output Summary  (Last 24 hours) at 12/11/15 1845 Last data filed at 12/11/15 1815  Gross per 24 hour  Intake           3207.5 ml  Output             1450 ml  Net           1757.5 ml   Filed Weights   12/10/15 1549  Weight: 80.8 kg (178 lb 3.2 oz)    Examination:  General exam: Appears calm and comfortable  Respiratory system: Clear to auscultation. Respiratory effort normal. Cardiovascular system: S1 & S2 heard, RRR. No JVD, rubs, gallops or clicks. No pedal edema. Gastrointestinal system: Abdomen is nondistended, soft and nontender. No organomegaly or masses felt. Normal bowel sounds heard. Central nervous system: Alert and oriented. No focal neurological deficits. Extremities: Symmetric 5 x 5 power.Right foot with erythema and TTP, swelling extends from toes to mid foot, mild drainage noted    Data Reviewed: I have personally reviewed following labs and imaging studies  CBC:  Recent Labs Lab 12/10/15 1206 12/11/15 0517  WBC 13.6* 10.1  NEUTROABS 10.9*  --   HGB 10.3* 9.8*  HCT 31.2* 29.6*  MCV 91.8 91.1  PLT 253 919   Basic Metabolic Panel:  Recent Labs Lab 12/10/15 1206 12/11/15 0517  NA 130* 130*  K 4.4 3.8  CL 97* 100*  CO2 24  22  GLUCOSE 108* 61*  BUN 20 19  CREATININE 1.58* 1.26*  CALCIUM 8.8* 8.4*   Liver Function Tests:  Recent Labs Lab 12/10/15 1206 12/11/15 0517  AST 21 19  ALT 10* 10*  ALKPHOS 61 56  BILITOT 1.7* 1.6*  PROT 7.7 7.0  ALBUMIN 3.7 3.2*   CBG:  Recent Labs Lab 12/10/15 1646 12/10/15 2102 12/11/15 0749 12/11/15 1147 12/11/15 1633  GLUCAP 118* 167* 84 91 131*   Urine analysis:    Component Value Date/Time   COLORURINE AMBER (A) 12/10/2015 1430   APPEARANCEUR CLOUDY (A) 12/10/2015 1430   LABSPEC 1.016 12/10/2015 1430   PHURINE 6.0 12/10/2015 1430   GLUCOSEU NEGATIVE 12/10/2015 1430   HGBUR SMALL (A) 12/10/2015 1430   BILIRUBINUR SMALL (A) 12/10/2015 1430   BILIRUBINUR n 12/30/2010 1700   KETONESUR NEGATIVE 12/10/2015 1430    PROTEINUR 30 (A) 12/10/2015 1430   UROBILINOGEN 1.0 09/25/2014 1550   NITRITE NEGATIVE 12/10/2015 1430   LEUKOCYTESUR LARGE (A) 12/10/2015 1430    Recent Results (from the past 240 hour(s))  Culture, blood (Routine x 2)     Status: None (Preliminary result)   Collection Time: 12/10/15 11:59 AM  Result Value Ref Range Status   Specimen Description BLOOD RIGHT ANTECUBITAL  Final   Special Requests BOTTLES DRAWN AEROBIC AND ANAEROBIC 5CC  Final   Culture   Final    NO GROWTH 1 DAY Performed at [facility]    Report Status PENDING  Incomplete  Culture, blood (Routine x 2)     Status: None (Preliminary result)   Collection Time: 12/10/15 12:27 PM  Result Value Ref Range Status   Specimen Description BLOOD RIGHT ANTECUBITAL  Final   Special Requests BOTTLES DRAWN AEROBIC AND ANAEROBIC 5CC  Final   Culture   Final    NO GROWTH 1 DAY Performed at [facility]    Report Status PENDING  Incomplete  Urine culture     Status: None   Collection Time: 12/10/15  2:30 PM  Result Value Ref Range Status   Specimen Description URINE, CLEAN CATCH  Final   Special Requests NONE  Final   Culture NO GROWTH Performed at [facility]   Final   Report Status 12/11/2015 FINAL  Final    Radiology Studies: Dg Foot Complete Right  Result Date: 12/10/2015 CLINICAL DATA:  Cellulitis question infection, RIGHT foot ulcer, redness, pain and inflammation for 4 days EXAM: RIGHT FOOT COMPLETE - 3+ VIEW COMPARISON:  None FINDINGS: Marked osseous demineralization. Scattered small vessel vascular calcification. Scattered soft tissue swelling. Joint spaces preserved. Tiny plantar calcaneal spur. No acute fracture, dislocation, or bone destruction. IMPRESSION: Osseous demineralization and soft tissue swelling without definite acute bony abnormalities. If there is persistent clinical concern for occult osteomyelitis, recommend MR imaging followup. Electronically Signed   By: Lavonia Dana M.D.   On:  12/10/2015 12:49   Scheduled Meds: . apixaban  2.5 mg Oral BID  . ceFTAROline (TEFLARO) IV  400 mg Intravenous Q12H  . cholecalciferol  3,000 Units Oral Q lunch  . gabapentin  100 mg Oral BID  . insulin aspart  0-9 Units Subcutaneous TID WC  . levothyroxine  100 mcg Oral QAC breakfast  . predniSONE  5 mg Oral Q breakfast  . sodium chloride flush  3 mL Intravenous Q12H   Continuous Infusions: . sodium chloride 75 mL/hr at 12/11/15 0530     LOS: 1 day    Time spent: 20 minutes  Faye Ramsay, MD [facility] Pager [phone]  If 7PM-7AM, please contact night-coverage [email or web address] Password [email or web address] 12/11/2015, 6:45 PM

## 2015-12-12 LAB — BASIC METABOLIC PANEL
ANION GAP: 7 (ref 5–15)
BUN: 16 mg/dL (ref 6–20)
CALCIUM: 8.3 mg/dL — AB (ref 8.9–10.3)
CO2: 21 mmol/L — AB (ref 22–32)
CREATININE: 1.27 mg/dL — AB (ref 0.44–1.00)
Chloride: 103 mmol/L (ref 101–111)
GFR calc Af Amer: 45 mL/min — ABNORMAL LOW (ref >60)
GFR calc non Af Amer: 38 mL/min — ABNORMAL LOW (ref >60)
GLUCOSE: 66 mg/dL (ref 65–99)
Potassium: 3.9 mmol/L (ref 3.5–5.1)
Sodium: 131 mmol/L — ABNORMAL LOW (ref 135–145)

## 2015-12-12 LAB — GLUCOSE, CAPILLARY
Glucose-Capillary: 72 mg/dL (ref 65–99)
Glucose-Capillary: 84 mg/dL (ref 65–99)
Glucose-Capillary: 157 mg/dL — ABNORMAL HIGH (ref 65–99)
Glucose-Capillary: 145 mg/dL — ABNORMAL HIGH (ref 65–99)

## 2015-12-12 LAB — CBC
HEMATOCRIT: 26.5 % — AB (ref 36.0–46.0)
Hemoglobin: 8.4 g/dL — ABNORMAL LOW (ref 12.0–15.0)
MCH: 28.6 pg (ref 26.0–34.0)
MCHC: 31.7 g/dL (ref 30.0–36.0)
MCV: 90.1 fL (ref 78.0–100.0)
PLATELETS: 213 10*3/uL (ref 150–400)
RBC: 2.94 MIL/uL — ABNORMAL LOW (ref 3.87–5.11)
RDW: 13.5 % (ref 11.5–15.5)
WBC: 7.3 10*3/uL (ref 4.0–10.5)

## 2015-12-12 MED ORDER — AMLODIPINE BESYLATE 5 MG PO TABS
5.0000 mg | ORAL_TABLET | Freq: Every day | ORAL | Status: DC
  Administered 2015-12-12 – 2015-12-13 (×2): 5 mg via ORAL
  Filled 2015-12-12 (×2): qty 1

## 2015-12-12 MED ORDER — IRBESARTAN 150 MG PO TABS
75.0000 mg | ORAL_TABLET | Freq: Every day | ORAL | Status: DC
  Administered 2015-12-12 – 2015-12-13 (×2): 75 mg via ORAL
  Filled 2015-12-12 (×2): qty 1

## 2015-12-12 NOTE — Progress Notes (Signed)
Patient ID: [name] [name], female   DOB: 1932/08/06, 82 y.o.   MRN: [id number]    PROGRESS NOTE    [name] [name]  [id number]:[id number] DOB: 1933/01/03 DOA: 12/10/2015  PCP: Kristian Covey, MD   Brief Narrative:  82 y.o. female with medical history significant for diabetes mellitus type 2, who presented to hospital with the chief complaint of right foot edema and erythema,. Four days in duration.   Assessment & Plan:   1. Right foot cellulitis. Underwent I&D in ED, foot look better today and with less swelling but toes especially right great toes still rather erythematous and with some drainage on the plantar aspect of the foot. I am worried that switching to PO ABX is too premature so will continue ABX ceftareline day #3, reassess wound in AM. Keep extremity elevated. If continued improved documented, will change to PO ABX in AM.   2. Hypertension, essential. amlodipine, bysystolic, valsartan and furosemide held on admission, BP stable today, resume norvasc and valsartan today, if BP stable, plan to resume lasix in AM  3. Atrial fibrillation, chronic, rate controlled. Continue apixaban, not on any rate controlling agents. D/C tele as pt with no chest pain   4. 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 Hypothyroidism. Continue levothyroxine at her her home regimen of 100 mcg daily.  6. Depression. Continue diazepam  7. Chronic kidney diease stage III - baseline Cr in the past year 1.5 - 1.8. Cr currently at baseline.   8. Hyponatremia - mild, monitor    DVT prophylaxis: Apixaban Code Status: Full  Family Communication: Patient at bedside  Disposition Plan: home in AM  Consultants:   None   Procedures:   I&D 9/4 right foot abscess in ED -->  Antimicrobials:   Ceftareline 9/4 -->   Subjective: No events overnight    Objective: Vitals:   12/11/15 1500 12/11/15 2032 12/12/15 0529 12/12/15 1330  BP: (!) 142/81 (!) 146/77 140/63 (!) 148/88  Pulse: 60 74 68 69  Resp: 20 18 18 18   Temp: 98.7 F (37.1 C) 98.5 F (36.9 C) 98.7 F (37.1 C) 98.3 F (36.8 C)  TempSrc: Oral Oral Oral Oral  SpO2: 96% 100% 99% 100%  Weight:      Height:        Intake/Output Summary (Last 24 hours) at 12/12/15 1554 Last data filed at 12/12/15 1330  Gross per 24 hour  Intake          2476.25 ml  Output             1425 ml  Net          1051.25 ml   Filed Weights   12/10/15 1549  Weight: 80.8 kg (178 lb 3.2 oz)    Examination:  General exam: Appears calm and comfortable  Respiratory system: Clear to auscultation. Respiratory effort normal. Cardiovascular system: S1 & S2 heard, RRR. No JVD, rubs, gallops or clicks. No pedal edema. Gastrointestinal system: Abdomen is nondistended, soft and nontender. No organomegaly or masses felt. Normal bowel sounds heard. Central nervous system: Alert and oriented. No focal neurological deficits. Extremities: Symmetric 5 x 5 power.Right foot with less erythema and TTP, swelling extends from toes to mid foot but is has subsided a lot at lead 50% since yesterday, mild drainage still noted, right great toe still rather erythematous and TTP  Data Reviewed: I have  personally reviewed following labs and imaging studies  CBC:  Recent Labs Lab 12/10/15 1206 12/11/15 0517 12/12/15 0505  WBC 13.6* 10.1 7.3  NEUTROABS 10.9*  --   --   HGB 10.3* 9.8* 8.4*  HCT 31.2* 29.6* 26.5*  MCV 91.8 91.1 90.1  PLT 253 245 213   Basic Metabolic Panel:  Recent Labs Lab 12/10/15 1206 12/11/15 0517 12/12/15 0505  NA 130* 130* 131*  K 4.4 3.8 3.9  CL 97* 100* 103  CO2 24 22 21*  GLUCOSE 108* 61* 66  BUN 20 19 16   CREATININE 1.58* 1.26* 1.27*  CALCIUM 8.8* 8.4* 8.3*   Liver Function Tests:  Recent Labs Lab 12/10/15 1206 12/11/15 0517  AST 21 19  ALT 10* 10*  ALKPHOS 61 56   BILITOT 1.7* 1.6*  PROT 7.7 7.0  ALBUMIN 3.7 3.2*   CBG:  Recent Labs Lab 12/11/15 1147 12/11/15 1633 12/11/15 2030 12/12/15 0730 12/12/15 1200  GLUCAP 91 131* 112* 72 84   Urine analysis:    Component Value Date/Time   COLORURINE AMBER (A) 12/10/2015 1430   APPEARANCEUR CLOUDY (A) 12/10/2015 1430   LABSPEC 1.016 12/10/2015 1430   PHURINE 6.0 12/10/2015 1430   GLUCOSEU NEGATIVE 12/10/2015 1430   HGBUR SMALL (A) 12/10/2015 1430   BILIRUBINUR SMALL (A) 12/10/2015 1430   BILIRUBINUR n 12/30/2010 1700   KETONESUR NEGATIVE 12/10/2015 1430   PROTEINUR 30 (A) 12/10/2015 1430   UROBILINOGEN 1.0 09/25/2014 1550   NITRITE NEGATIVE 12/10/2015 1430   LEUKOCYTESUR LARGE (A) 12/10/2015 1430    Recent Results (from the past 240 hour(s))  Culture, blood (Routine x 2)     Status: None (Preliminary result)   Collection Time: 12/10/15 11:59 AM  Result Value Ref Range Status   Specimen Description BLOOD RIGHT ANTECUBITAL  Final   Special Requests BOTTLES DRAWN AEROBIC AND ANAEROBIC 5CC  Final   Culture   Final    NO GROWTH 2 DAYS Performed at [facility]    Report Status PENDING  Incomplete  Culture, blood (Routine x 2)     Status: None (Preliminary result)   Collection Time: 12/10/15 12:27 PM  Result Value Ref Range Status   Specimen Description BLOOD RIGHT ANTECUBITAL  Final   Special Requests BOTTLES DRAWN AEROBIC AND ANAEROBIC 5CC  Final   Culture   Final    NO GROWTH 2 DAYS Performed at [facility]    Report Status PENDING  Incomplete  Urine culture     Status: None   Collection Time: 12/10/15  2:30 PM  Result Value Ref Range Status   Specimen Description URINE, CLEAN CATCH  Final   Special Requests NONE  Final   Culture NO GROWTH Performed at [facility]   Final   Report Status 12/11/2015 FINAL  Final    Radiology Studies: No results found. Scheduled Meds: . apixaban  2.5 mg Oral BID  . ceFTAROline (TEFLARO) IV  400 mg Intravenous Q12H   . cholecalciferol  3,000 Units Oral Q lunch  . gabapentin  100 mg Oral BID  . insulin aspart  0-9 Units Subcutaneous TID WC  . levothyroxine  100 mcg Oral QAC breakfast  . predniSONE  5 mg Oral Q breakfast  . sodium chloride flush  3 mL Intravenous Q12H   Continuous Infusions: . sodium chloride 75 mL/hr at 12/12/15 0934     LOS: 2 days    Time spent: 20 minutes    MAGICK-[name] [name], [name] Handing, MD [facility] Pager  [phone]  If 7PM-7AM, please contact night-coverage [email or web address] Password [facility] 12/12/2015, 3:54 PM

## 2015-12-12 NOTE — Progress Notes (Signed)
Inpatient Diabetes Program Recommendations  AACE/ADA: New Consensus Statement on Inpatient Glycemic Control (2015)  Target Ranges:  Prepandial:   less than 140 mg/dL      Peak postprandial:   less than 180 mg/dL (1-2 hours)      Critically ill patients:  140 - 180 mg/dL   Results for [name] [name], [name] [name] (MRN [id number]) as of 12/12/2015 10:07  Ref. Range 12/11/2015 07:49 12/11/2015 11:47 12/11/2015 16:33 12/11/2015 20:30  Glucose-Capillary Latest Ref Range: 65 - 99 mg/dL 84 91 131 (H) 112 (H)   Results for [name], [name] (MRN [id number]) as of 12/12/2015 10:07  Ref. Range 12/12/2015 07:30  Glucose-Capillary Latest Ref Range: 65 - 99 mg/dL 72   Results for [name], [name] (MRN [id number]) as of 12/12/2015 10:07  Ref. Range 11/30/2015 11:04  Hemoglobin A1C Latest Ref Range: 4.6 - 6.5 % 6.2    Admit with: Foot Ulcer/ Cellulitis  History: DM, CVA  Home DM Meds: Amaryl 2 mg daily  Current Insulin Orders: Novolog Sensitive Correction Scale/ SSI (0-9 units) TID AC     -CBGs well controlled at present.  Has not required any Novolog SSI yet since admission.  -Last A1c drawn by patient's PCP was 6.2% back on 11/30/15.  Well controlled at home with Amaryl alone.     --Will follow patient during hospitalization--  Wyn Quaker RN, MSN, CDE Diabetes Coordinator Inpatient Glycemic Control Team Team Pager: [phone] (8a-5p)

## 2015-12-12 NOTE — Care Management Note (Addendum)
Case Management Note  Patient Details  Name: [name] [name] MRN: [id number] Date of Birth: 22-May-1932  Subjective/Objective:                  Right foot swelling and redness Action/Plan: Discharge planning Expected Discharge Date:   (UNKNOWN)               Expected Discharge Plan:  [place]  In-House Referral:     Discharge planning Services  CM Consult  Post Acute Care Choice:  Home Health, Durable Medical Equipment Choice offered to:  Patient  DME Arranged:  Walker rolling with seat DME Agency:  [place]:  RN [facility] Agency:  [place]  Status of Service:  In process, will continue to follow  If discussed at Long Length of Stay Meetings, dates discussed:    Additional Comments: 12/13/15 CM notified [place] orders and face to face are in and pt discharging today.  No other CM needs were communicated. CM met with pt, pt's spouse, and pt's son Marden Noble in room to offer choice of home health agency.  Pt chooses [facility] to render [facility] for wound care.  CM has requested face to face and [facility] with specific dressing change orders from MD.  Referral called to [facility] rep, Collie Siad.  CM notified [facility] DME rep, Jermaine to please deliver the rollator to room prior to discharge.  CM will monitor for orders and face to face.   Dellie Catholic, RN 12/12/2015, 11:43 AM

## 2015-12-13 DIAGNOSIS — L0390 Cellulitis, unspecified: Secondary | ICD-10-CM

## 2015-12-13 DIAGNOSIS — L0291 Cutaneous abscess, unspecified: Secondary | ICD-10-CM

## 2015-12-13 LAB — BASIC METABOLIC PANEL
ANION GAP: 6 (ref 5–15)
BUN: 15 mg/dL (ref 6–20)
CALCIUM: 8.7 mg/dL — AB (ref 8.9–10.3)
CO2: 23 mmol/L (ref 22–32)
Chloride: 102 mmol/L (ref 101–111)
Creatinine, Ser: 1.14 mg/dL — ABNORMAL HIGH (ref 0.44–1.00)
GFR calc Af Amer: 51 mL/min — ABNORMAL LOW (ref >60)
GFR calc non Af Amer: 44 mL/min — ABNORMAL LOW (ref >60)
GLUCOSE: 82 mg/dL (ref 65–99)
Potassium: 4 mmol/L (ref 3.5–5.1)
Sodium: 131 mmol/L — ABNORMAL LOW (ref 135–145)

## 2015-12-13 LAB — GLUCOSE, CAPILLARY
GLUCOSE-CAPILLARY: 129 mg/dL — AB (ref 65–99)
Glucose-Capillary: 81 mg/dL (ref 65–99)

## 2015-12-13 LAB — CBC
HEMATOCRIT: 28.2 % — AB (ref 36.0–46.0)
Hemoglobin: 9.2 g/dL — ABNORMAL LOW (ref 12.0–15.0)
MCH: 30 pg (ref 26.0–34.0)
MCHC: 32.6 g/dL (ref 30.0–36.0)
MCV: 91.9 fL (ref 78.0–100.0)
Platelets: 245 10*3/uL (ref 150–400)
RBC: 3.07 MIL/uL — ABNORMAL LOW (ref 3.87–5.11)
RDW: 13.6 % (ref 11.5–15.5)
WBC: 7.3 10*3/uL (ref 4.0–10.5)

## 2015-12-13 MED ORDER — MUPIROCIN CALCIUM 2 % EX CREA
TOPICAL_CREAM | Freq: Every day | CUTANEOUS | Status: DC
  Filled 2015-12-13: qty 15

## 2015-12-13 MED ORDER — AMOXICILLIN-POT CLAVULANATE 500-125 MG PO TABS: 1.0000 | ORAL_TABLET | Freq: Three times a day (TID) | ORAL | 0 refills | Status: DC

## 2015-12-13 MED ORDER — DOXYCYCLINE HYCLATE 100 MG PO TABS: 100.0000 mg | ORAL_TABLET | Freq: Two times a day (BID) | ORAL | 0 refills | Status: DC

## 2015-12-13 MED ORDER — MUPIROCIN CALCIUM 2 % EX CREA: TOPICAL_CREAM | Freq: Every day | CUTANEOUS | 1 refills | Status: DC

## 2015-12-13 NOTE — Discharge Instructions (Signed)
Cellulitis Cellulitis is an infection of the skin and the tissue beneath it. The infected area is usually red and tender. Cellulitis occurs most often in the arms and lower legs.  CAUSES  Cellulitis is caused by bacteria that enter the skin through cracks or cuts in the skin. The most common types of bacteria that cause cellulitis are staphylococci and streptococci. SIGNS AND SYMPTOMS   Redness and warmth.  Swelling.  Tenderness or pain.  Fever. DIAGNOSIS  Your health care provider can usually determine what is wrong based on a physical exam. Blood tests may also be done. TREATMENT  Treatment usually involves taking an antibiotic medicine. HOME CARE INSTRUCTIONS   Take your antibiotic medicine as directed by your health care provider. Finish the antibiotic even if you start to feel better.  Keep the infected arm or leg elevated to reduce swelling.  Apply a warm cloth to the affected area up to 4 times per day to relieve pain.  Take medicines only as directed by your health care provider.  Keep all follow-up visits as directed by your health care provider. SEEK MEDICAL CARE IF:   You notice red streaks coming from the infected area.  Your red area gets larger or turns dark in color.  Your bone or joint underneath the infected area becomes painful after the skin has healed.  Your infection returns in the same area or another area.  You notice a swollen bump in the infected area.  You develop new symptoms.  You have a fever. SEEK IMMEDIATE MEDICAL CARE IF:   You feel very sleepy.  You develop vomiting or diarrhea.  You have a general ill feeling (malaise) with muscle aches and pains.   This information is not intended to replace advice given to you by your health care provider. Make sure you discuss any questions you have with your health care provider.   Document Released: 01/01/2005 Document Revised: 12/13/2014 Document Reviewed: 06/09/2011 Elsevier Interactive  Patient Education [employer].

## 2015-12-13 NOTE — Evaluation (Signed)
Physical Therapy Evaluation Patient Details Name: [name] [name] MRN: [id number] DOB: 07-14-1932 Today's Date: 12/13/2015   History of Present Illness  82 yo female admitted with diabetic foot ulcer, R foot cellulitis. S/P bedside debridement 9/4. Hx of COPD, vertigo, DM, CVA, HTN, A fib  Clinical Impression  On eval, pt was Min guard assist for mobility. She walked ~15'x2 with RW. Pt tolerated activity well. She declined hallway ambulation during time of eval. Pt reports she has a history of vertigo and that it limits her ambulation. Family present during session. Do not feel pt has any follow up PT needs at this time-pt in agreement. Instructed pt/family to make MD/PCP physician aware if needs change.     Follow Up Recommendations No PT follow up;Supervision for mobility/OOB    Equipment Recommendations  None recommended by PT    Recommendations for Other Services       Precautions / Restrictions Precautions Precautions: Fall Restrictions Weight Bearing Restrictions: No      Mobility  Bed Mobility               General bed mobility comments: oob in recliner  Transfers Overall transfer level: Needs assistance Equipment used: Rolling walker (2 wheeled) Transfers: Sit to/from Stand Sit to Stand: Min guard         General transfer comment: close guard for safety.   Ambulation/Gait Ambulation/Gait assistance: Min guard Ambulation Distance (Feet): 15 Feet (x2) Assistive device: Rolling walker (2 wheeled) Gait Pattern/deviations: Step-through pattern;Decreased stride length     General Gait Details: slow but steady with RW. Pt agreeable to walk to and from bathroom. She declined hallway ambulation on today.   Stairs            Wheelchair Mobility    Modified Rankin (Stroke Patients Only)       Balance Overall balance assessment: Needs assistance         Standing balance support: Bilateral upper extremity supported Standing balance-Leahy Scale:  Poor                               Pertinent Vitals/Pain Pain Assessment: Faces Faces Pain Scale: Hurts a little bit Pain Location: R foot Pain Descriptors / Indicators: Sore Pain Intervention(s): Monitored during session    Home Living Family/patient expects to be discharged to:: Private residence Living Arrangements: Spouse/significant other Available Help at Discharge: Family Type of Home: House Home Access: Stairs to enter Entrance Stairs-Rails: Psychiatric nurse of Steps: "a few" Home Layout: One level Home Equipment: Walker - 2 wheels;Walker - 4 wheels;Wheelchair - manual      Prior Function Level of Independence: Independent with assistive device(s)         Comments: RW for ambulation     Hand Dominance        Extremity/Trunk Assessment   Upper Extremity Assessment: Overall WFL for tasks assessed           Lower Extremity Assessment: Generalized weakness;RLE deficits/detail RLE Deficits / Details: R foot dressing    Cervical / Trunk Assessment: Normal  Communication   Communication: No difficulties  Cognition Arousal/Alertness: Awake/alert Behavior During Therapy: WFL for tasks assessed/performed Overall Cognitive Status: Within Functional Limits for tasks assessed                      General Comments      Exercises        Assessment/Plan  PT Assessment Patent does not need any further PT services  PT Diagnosis Difficulty walking;Acute pain   PT Problem List    PT Treatment Interventions     PT Goals (Current goals can be found in the Care Plan section) Acute Rehab PT Goals Patient Stated Goal: home today PT Goal Formulation: With patient/family Time For Goal Achievement: 12/27/15 Potential to Achieve Goals: Good    Frequency     Barriers to discharge        Co-evaluation               End of Session   Activity Tolerance: Patient tolerated treatment well Patient left: in  chair;with call bell/phone within reach;with chair alarm set;with family/visitor present           Time: 4492-0100 PT Time Calculation (min) (ACUTE ONLY): 21 min   Charges:   PT Evaluation $PT Eval Low Complexity: 1 Procedure     PT G Codes:        Weston Anna, MPT Pager: [phone]

## 2015-12-13 NOTE — Discharge Summary (Signed)
Physician Discharge Summary  [name] [name] GNP:[id number] DOB: 1933/02/13 DOA: 12/10/2015  PCP: [name], MD  Admit date: 12/10/2015 Discharge date: 12/13/2015  Recommendations for Outpatient Follow-up:  1. Pt will need to follow up with PCP 1-2 to llok at the right foot wound and cellulitis  2. Please obtain BMP to evaluate electrolytes and kidney function 3. Please note that pt was discharged on Augmentin and Doxy to complete therapy for cellulitis   Discharge Diagnoses:  Active Problems:   Diabetic foot ulcer ([place])   Cellulitis and abscess of foot  Discharge Condition: Stable  Diet recommendation: Heart healthy diet discussed in details   Brief Narrative:  82 y.o.femalewith medical history significant for diabetes mellitus type 2, who presented to hospital with the chief complaint of right foot edema and erythema,. Four days in duration.   Assessment & Plan:   1. Right foot cellulitis, right plantar foot with full thickness wound. Underwent I&D in ED, foot looks better today and with less swelling, less erythema but toes especially right great toes still swollen and erythematous. [place] team saw pt in consultation and recommended Bactroban to provide antimicrobial benefits and promote moist healing. Pt wants to go home today as she has husband with dementia and does not want to leave him alone any longer. I have discussed with pt that her cellulitis is still not resolved and needs close monitoring. We have changed Rocephin to doxy and augmentin to cover more broadly as pt has underlying DM. Pt made aware to follow up with PCP to have a second look at the wound within 1-2 weeks. Pt also educated on what are some of the signs and symptoms of worsening infection. Pt has verbalized understanding. HH PT and RN set up to help pt at home.   2. Hypertension, essential. amlodipine,bysystolic, valsartan and furosemide held on admission, BP stable today  3. Atrial fibrillation,  chronic, rate controlled. Continue apixaban, not on any rate controlling agents, rate has been controlled  4. 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 Hypothyroidism.Continue levothyroxine per home regimen of 100 mcg daily.  6. Depression.Continue diazepam  7. Chronic kidney diease stage III - baseline Cr in the past year 1.5 - 1.8. Cr currently at baseline.   8. Hyponatremia - mild but stable overall    DVT prophylaxis: Apixaban Code Status: Full  Family Communication: Patient at bedside  Disposition Plan: home   Consultants:   None   Procedures:   I&D 9/4 right foot abscess in ED -->  Antimicrobials:   Ceftareline 9/4 --> changed to doxy and Augmentin upon discharge to complete therapy   Discharge Exam: Vitals:   12/12/15 2105 12/13/15 0432  BP: (!) 146/80 109/66  Pulse: 85 (!) 56  Resp: 18 20  Temp: 97.8 F (36.6 C) 98.3 F (36.8 C)   Vitals:   12/12/15 0529 12/12/15 1330 12/12/15 2105 12/13/15 0432  BP: 140/63 (!) 148/88 (!) 146/80 109/66  Pulse: 68 69 85 (!) 56  Resp: _0 Temp: 98.7 F (37.1 C) 98.3 F (36.8 C) 97.8 F (36.6 C) 98.3 F (36.8 C)  TempSrc: Oral Oral Oral Oral  SpO2: 99% 100% 100% 97%  Weight:      Height:        General: Pt is alert, follows commands appropriately, not in acute distress Cardiovascular: Regular rhythm, mild bradycardia, S1/S2 +, no rubs, no gallops Respiratory: Clear to  auscultation bilaterally, no wheezing, no crackles, no rhonchi Abdominal: Soft, non tender, non distended, bowel sounds +, no guarding Extremities: right foot edema much more improved, pt reported less TTP   Discharge Instructions  Discharge Instructions    Diet - low sodium heart healthy    Complete by:  As directed   Increase activity slowly    Complete by:  As directed       Medication List    TAKE these medications   amLODipine  5 MG tablet Commonly known as:  NORVASC TAKE ONE TABLET BY MOUTH ONCE DAILY What changed:  See the new instructions.   amoxicillin-clavulanate 500-125 MG tablet Commonly known as:  AUGMENTIN Take 1 tablet (500 mg total) by mouth 3 (three) times daily.   apixaban 2.5 MG Tabs tablet Commonly known as:  ELIQUIS Take 1 tablet (2.5 mg total) by mouth 2 (two) times daily.   BYSTOLIC 10 MG tablet Generic drug:  nebivolol TAKE ONE TABLET BY MOUTH ONCE DAILY AFTER  BREAKFAST. What changed:  See the new instructions.   CALCIUM CITRATE PO Take 1,200 mg by mouth daily with lunch.   cholecalciferol 1000 units tablet Commonly known as:  VITAMIN D Take 3,000 Units by mouth daily with lunch.   diazepam 5 MG tablet Commonly known as:  VALIUM Take one tablet by mouth as needed for severe vertigo flares What changed:  how much to take  how to take this  when to take this  reasons to take this  additional instructions   doxycycline 100 MG tablet Commonly known as:  VIBRA-TABS Take 1 tablet (100 mg total) by mouth 2 (two) times daily.   escitalopram 10 MG tablet Commonly known as:  LEXAPRO Take 1 tablet (10 mg total) by mouth daily.   furosemide 20 MG tablet Commonly known as:  LASIX TAKE ONE TABLET BY MOUTH ONCE DAILY What changed:  See the new instructions.   gabapentin 100 MG capsule Commonly known as:  NEURONTIN TAKE ONE CAPSULE BY MOUTH TWICE DAILY BEFORE A MEAL. What changed:  See the new instructions.   glimepiride 2 MG tablet Commonly known as:  AMARYL TAKE ONE TABLET BY MOUTH ONCE DAILY BEFORE BREAKFAST What changed:  how much to take  how to take this  when to take this  additional instructions   levothyroxine 100 MCG tablet Commonly known as:  SYNTHROID, LEVOTHROID Take 1 tablet (100 mcg total) by mouth daily before breakfast.   loratadine 10 MG tablet Commonly known as:  CLARITIN Take 10 mg by mouth daily as needed for allergies.   mupirocin cream  2 % Commonly known as:  BACTROBAN Apply topically daily.   predniSONE 5 MG tablet Commonly known as:  DELTASONE Take 1 tablet (5 mg total) by mouth daily with breakfast.   triamcinolone cream 0.1 % Commonly known as:  KENALOG Apply 1 application topically 2 (two) times daily as needed. What changed:  reasons to take this   valsartan 160 MG tablet Commonly known as:  DIOVAN Take 1 tablet (160 mg total) by mouth daily. What changed:  when to take this      [place] .   Why:  home health nurse for dressing changes and rollator Contact information: [address] [place] [address] [phone]        [name], MD .   Specialty:  Family Medicine Contact information: [address] [place] [address] [phone]  Faye Ramsay, MD .   Specialty:  Internal Medicine Contact information: [address] [place] [address] [phone]            The results of significant diagnostics from this hospitalization (including imaging, microbiology, ancillary and laboratory) are listed below for reference.     Microbiology: Recent Results (from the past 240 hour(s))  Culture, blood (Routine x 2)     Status: None (Preliminary result)   Collection Time: 12/10/15 11:59 AM  Result Value Ref Range Status   Specimen Description BLOOD RIGHT ANTECUBITAL  Final   Special Requests BOTTLES DRAWN AEROBIC AND ANAEROBIC 5CC  Final   Culture   Final    NO GROWTH 2 DAYS Performed at [facility]    Report Status PENDING  Incomplete  Culture, blood (Routine x 2)     Status: None (Preliminary result)   Collection Time: 12/10/15 12:27 PM  Result Value Ref Range Status   Specimen Description BLOOD RIGHT ANTECUBITAL  Final   Special Requests BOTTLES DRAWN AEROBIC AND ANAEROBIC 5CC  Final   Culture   Final    NO GROWTH 2 DAYS Performed at [facility]     Report Status PENDING  Incomplete  Urine culture     Status: None   Collection Time: 12/10/15  2:30 PM  Result Value Ref Range Status   Specimen Description URINE, CLEAN CATCH  Final   Special Requests NONE  Final   Culture NO GROWTH Performed at [facility]   Final   Report Status 12/11/2015 FINAL  Final     Labs: Basic Metabolic Panel:  Recent Labs Lab 12/10/15 1206 12/11/15 0517 12/12/15 0505 12/13/15 0450  NA 130* 130* 131* 131*  K 4.4 3.8 3.9 4.0  CL 97* 100* 103 102  CO2 24 22 21* 23  GLUCOSE 108* 61* 66 82  BUN _0 CREATININE 1.58* 1.26* 1.27* 1.14*  CALCIUM 8.8* 8.4* 8.3* 8.7*   Liver Function Tests:  Recent Labs Lab 12/10/15 1206 12/11/15 0517  AST 21 19  ALT 10* 10*  ALKPHOS 61 56  BILITOT 1.7* 1.6*  PROT 7.7 7.0  ALBUMIN 3.7 3.2*   No results for input(s): LIPASE, AMYLASE in the last 168 hours. No results for input(s): AMMONIA in the last 168 hours. CBC:  Recent Labs Lab 12/10/15 1206 12/11/15 0517 12/12/15 0505 12/13/15 0450  WBC 13.6* 10.1 7.3 7.3  NEUTROABS 10.9*  --   --   --   HGB 10.3* 9.8* 8.4* 9.2*  HCT 31.2* 29.6* 26.5* 28.2*  MCV 91.8 91.1 90.1 91.9  PLT 253 245 213 245   CBG:  Recent Labs Lab 12/12/15 0730 12/12/15 1200 12/12/15 1644 12/12/15 2102 12/13/15 0752  GLUCAP 72 84 157* 145* 81   SIGNED: Time coordinating discharge: 30 minutes  MAGICK-MYERS, ISKRA, MD  [facility] 12/13/2015, 10:56 AM Pager [phone]  If 7PM-7AM, please contact night-coverage [email or web address] Password [email or web address]

## 2015-12-13 NOTE — Consult Note (Signed)
[place] Nurse wound consult note Reason for Consult: Consult requested for right foot wounds.  Pt reports it is greatly improved from previous cellulitis which extended up half of her leg. Wound type: Right plantar foot with full thickness wound; .8X.8X.2cm, dark red dry wound bed, small amt dried brown scabbed edges.  No odor, small amt yellow drainage. Right great toe with generalized edema and erythremia which has not resolved at this time, some patchy areas are beginning to blister and peel. Full thickness wound located between right great toe and 2nd toe; .4X.1X.8cm, no odor, mod amt yellow drainage. Dressing procedure/placement/frequency: Bactroban to provide antimicrobial benefits and promote moist healing.  Discussed plan of care with patient and family members at the bedside and they verbalize understanding. Please re-consult if further assistance is needed.  Thank-you,  Julien Girt MSN, [place], [place], [place], [place]

## 2015-12-14 DIAGNOSIS — Z792 Long term (current) use of antibiotics: Secondary | ICD-10-CM | POA: Diagnosis not present

## 2015-12-14 DIAGNOSIS — M1991 Primary osteoarthritis, unspecified site: Secondary | ICD-10-CM | POA: Diagnosis not present

## 2015-12-14 DIAGNOSIS — I130 Hypertensive heart and chronic kidney disease with heart failure and stage 1 through stage 4 chronic kidney disease, or unspecified chronic kidney disease: Secondary | ICD-10-CM | POA: Diagnosis not present

## 2015-12-14 DIAGNOSIS — L02611 Cutaneous abscess of right foot: Secondary | ICD-10-CM | POA: Diagnosis not present

## 2015-12-14 DIAGNOSIS — Z7984 Long term (current) use of oral hypoglycemic drugs: Secondary | ICD-10-CM | POA: Diagnosis not present

## 2015-12-14 DIAGNOSIS — J449 Chronic obstructive pulmonary disease, unspecified: Secondary | ICD-10-CM | POA: Diagnosis not present

## 2015-12-14 DIAGNOSIS — L03115 Cellulitis of right lower limb: Secondary | ICD-10-CM | POA: Diagnosis not present

## 2015-12-14 DIAGNOSIS — E039 Hypothyroidism, unspecified: Secondary | ICD-10-CM | POA: Diagnosis not present

## 2015-12-14 DIAGNOSIS — Z794 Long term (current) use of insulin: Secondary | ICD-10-CM | POA: Diagnosis not present

## 2015-12-14 DIAGNOSIS — I509 Heart failure, unspecified: Secondary | ICD-10-CM | POA: Diagnosis not present

## 2015-12-14 DIAGNOSIS — E11621 Type 2 diabetes mellitus with foot ulcer: Secondary | ICD-10-CM | POA: Diagnosis not present

## 2015-12-14 DIAGNOSIS — L97411 Non-pressure chronic ulcer of right heel and midfoot limited to breakdown of skin: Secondary | ICD-10-CM | POA: Diagnosis not present

## 2015-12-14 DIAGNOSIS — E1140 Type 2 diabetes mellitus with diabetic neuropathy, unspecified: Secondary | ICD-10-CM | POA: Diagnosis not present

## 2015-12-14 DIAGNOSIS — E1122 Type 2 diabetes mellitus with diabetic chronic kidney disease: Secondary | ICD-10-CM | POA: Diagnosis not present

## 2015-12-14 DIAGNOSIS — I4891 Unspecified atrial fibrillation: Secondary | ICD-10-CM | POA: Diagnosis not present

## 2015-12-14 DIAGNOSIS — Z7901 Long term (current) use of anticoagulants: Secondary | ICD-10-CM | POA: Diagnosis not present

## 2015-12-14 DIAGNOSIS — F329 Major depressive disorder, single episode, unspecified: Secondary | ICD-10-CM | POA: Diagnosis not present

## 2015-12-14 DIAGNOSIS — Z7952 Long term (current) use of systemic steroids: Secondary | ICD-10-CM | POA: Diagnosis not present

## 2015-12-14 DIAGNOSIS — Z96652 Presence of left artificial knee joint: Secondary | ICD-10-CM | POA: Diagnosis not present

## 2015-12-14 DIAGNOSIS — N183 Chronic kidney disease, stage 3 (moderate): Secondary | ICD-10-CM | POA: Diagnosis not present

## 2015-12-14 DIAGNOSIS — Z8673 Personal history of transient ischemic attack (TIA), and cerebral infarction without residual deficits: Secondary | ICD-10-CM | POA: Diagnosis not present

## 2015-12-14 DIAGNOSIS — Z9981 Dependence on supplemental oxygen: Secondary | ICD-10-CM | POA: Diagnosis not present

## 2015-12-14 DIAGNOSIS — Z9181 History of falling: Secondary | ICD-10-CM | POA: Diagnosis not present

## 2015-12-15 LAB — CULTURE, BLOOD (ROUTINE X 2)
CULTURE: NO GROWTH
Culture: NO GROWTH

## 2015-12-17 DIAGNOSIS — L02611 Cutaneous abscess of right foot: Secondary | ICD-10-CM | POA: Diagnosis not present

## 2015-12-17 DIAGNOSIS — L97411 Non-pressure chronic ulcer of right heel and midfoot limited to breakdown of skin: Secondary | ICD-10-CM | POA: Diagnosis not present

## 2015-12-17 DIAGNOSIS — L03115 Cellulitis of right lower limb: Secondary | ICD-10-CM | POA: Diagnosis not present

## 2015-12-17 DIAGNOSIS — E11621 Type 2 diabetes mellitus with foot ulcer: Secondary | ICD-10-CM | POA: Diagnosis not present

## 2015-12-17 DIAGNOSIS — E1122 Type 2 diabetes mellitus with diabetic chronic kidney disease: Secondary | ICD-10-CM | POA: Diagnosis not present

## 2015-12-17 DIAGNOSIS — E1140 Type 2 diabetes mellitus with diabetic neuropathy, unspecified: Secondary | ICD-10-CM | POA: Diagnosis not present

## 2015-12-18 DIAGNOSIS — E1140 Type 2 diabetes mellitus with diabetic neuropathy, unspecified: Secondary | ICD-10-CM | POA: Diagnosis not present

## 2015-12-18 DIAGNOSIS — L97411 Non-pressure chronic ulcer of right heel and midfoot limited to breakdown of skin: Secondary | ICD-10-CM | POA: Diagnosis not present

## 2015-12-18 DIAGNOSIS — E11621 Type 2 diabetes mellitus with foot ulcer: Secondary | ICD-10-CM | POA: Diagnosis not present

## 2015-12-18 DIAGNOSIS — L03115 Cellulitis of right lower limb: Secondary | ICD-10-CM | POA: Diagnosis not present

## 2015-12-18 DIAGNOSIS — E1122 Type 2 diabetes mellitus with diabetic chronic kidney disease: Secondary | ICD-10-CM | POA: Diagnosis not present

## 2015-12-18 DIAGNOSIS — L02611 Cutaneous abscess of right foot: Secondary | ICD-10-CM | POA: Diagnosis not present

## 2015-12-22 DIAGNOSIS — E1122 Type 2 diabetes mellitus with diabetic chronic kidney disease: Secondary | ICD-10-CM | POA: Diagnosis not present

## 2015-12-22 DIAGNOSIS — E1140 Type 2 diabetes mellitus with diabetic neuropathy, unspecified: Secondary | ICD-10-CM | POA: Diagnosis not present

## 2015-12-22 DIAGNOSIS — L97411 Non-pressure chronic ulcer of right heel and midfoot limited to breakdown of skin: Secondary | ICD-10-CM | POA: Diagnosis not present

## 2015-12-22 DIAGNOSIS — L02611 Cutaneous abscess of right foot: Secondary | ICD-10-CM | POA: Diagnosis not present

## 2015-12-22 DIAGNOSIS — L03115 Cellulitis of right lower limb: Secondary | ICD-10-CM | POA: Diagnosis not present

## 2015-12-22 DIAGNOSIS — E11621 Type 2 diabetes mellitus with foot ulcer: Secondary | ICD-10-CM | POA: Diagnosis not present

## 2015-12-24 DIAGNOSIS — L97411 Non-pressure chronic ulcer of right heel and midfoot limited to breakdown of skin: Secondary | ICD-10-CM | POA: Diagnosis not present

## 2015-12-24 DIAGNOSIS — E11621 Type 2 diabetes mellitus with foot ulcer: Secondary | ICD-10-CM | POA: Diagnosis not present

## 2015-12-24 DIAGNOSIS — E1122 Type 2 diabetes mellitus with diabetic chronic kidney disease: Secondary | ICD-10-CM | POA: Diagnosis not present

## 2015-12-24 DIAGNOSIS — L03115 Cellulitis of right lower limb: Secondary | ICD-10-CM | POA: Diagnosis not present

## 2015-12-24 DIAGNOSIS — L02611 Cutaneous abscess of right foot: Secondary | ICD-10-CM | POA: Diagnosis not present

## 2015-12-24 DIAGNOSIS — E1140 Type 2 diabetes mellitus with diabetic neuropathy, unspecified: Secondary | ICD-10-CM | POA: Diagnosis not present

## 2015-12-27 DIAGNOSIS — L97411 Non-pressure chronic ulcer of right heel and midfoot limited to breakdown of skin: Secondary | ICD-10-CM | POA: Diagnosis not present

## 2015-12-27 DIAGNOSIS — L03115 Cellulitis of right lower limb: Secondary | ICD-10-CM | POA: Diagnosis not present

## 2015-12-27 DIAGNOSIS — E1122 Type 2 diabetes mellitus with diabetic chronic kidney disease: Secondary | ICD-10-CM | POA: Diagnosis not present

## 2015-12-27 DIAGNOSIS — E1140 Type 2 diabetes mellitus with diabetic neuropathy, unspecified: Secondary | ICD-10-CM | POA: Diagnosis not present

## 2015-12-27 DIAGNOSIS — L02611 Cutaneous abscess of right foot: Secondary | ICD-10-CM | POA: Diagnosis not present

## 2015-12-27 DIAGNOSIS — E11621 Type 2 diabetes mellitus with foot ulcer: Secondary | ICD-10-CM | POA: Diagnosis not present

## 2015-12-31 ENCOUNTER — Ambulatory Visit (INDEPENDENT_AMBULATORY_CARE_PROVIDER_SITE_OTHER): Payer: Medicare Other | Admitting: Family Medicine

## 2015-12-31 ENCOUNTER — Ambulatory Visit: Payer: Medicare Other | Admitting: Family Medicine

## 2015-12-31 ENCOUNTER — Other Ambulatory Visit: Payer: Self-pay | Admitting: Family Medicine

## 2015-12-31 VITALS — BP 110/80 | HR 61 | Temp 98.0°F | Ht 66.0 in | Wt 164.7 lb

## 2015-12-31 DIAGNOSIS — B373 Candidiasis of vulva and vagina: Secondary | ICD-10-CM | POA: Diagnosis not present

## 2015-12-31 DIAGNOSIS — Z23 Encounter for immunization: Secondary | ICD-10-CM | POA: Diagnosis not present

## 2015-12-31 DIAGNOSIS — B3731 Acute candidiasis of vulva and vagina: Secondary | ICD-10-CM

## 2015-12-31 DIAGNOSIS — L03115 Cellulitis of right lower limb: Secondary | ICD-10-CM

## 2015-12-31 DIAGNOSIS — E871 Hypo-osmolality and hyponatremia: Secondary | ICD-10-CM | POA: Diagnosis not present

## 2015-12-31 DIAGNOSIS — E119 Type 2 diabetes mellitus without complications: Secondary | ICD-10-CM

## 2015-12-31 DIAGNOSIS — R634 Abnormal weight loss: Secondary | ICD-10-CM

## 2015-12-31 LAB — BASIC METABOLIC PANEL
BUN: 24 mg/dL — AB (ref 6–23)
CHLORIDE: 95 meq/L — AB (ref 96–112)
CO2: 28 mEq/L (ref 19–32)
CREATININE: 1.92 mg/dL — AB (ref 0.40–1.20)
Calcium: 9 mg/dL (ref 8.4–10.5)
GFR: 26.62 mL/min — ABNORMAL LOW (ref >60.00)
Glucose, Bld: 118 mg/dL — ABNORMAL HIGH (ref 70–99)
Potassium: 4.6 mEq/L (ref 3.5–5.1)
Sodium: 132 mEq/L — ABNORMAL LOW (ref 135–145)

## 2015-12-31 MED ORDER — FLUCONAZOLE 150 MG PO TABS: 150.0000 mg | ORAL_TABLET | Freq: Once | ORAL | 0 refills | Status: AC

## 2015-12-31 NOTE — Progress Notes (Signed)
Subjective:     Patient ID: [name] [name], female   DOB: 1933-03-04, 82 y.o.   MRN: [id number]  HPI Patient seen for several issues as follows  She has type 2 diabetes and developed recent cellulitis of right foot which required admission September 4. She was treated with doxycycline and Augmentin is now off antibiotics and foot is doing much better.  She is concerned she may have yeast vaginitis. She has some itching but no discharge. She is requesting medication for that.  Type 2 diabetes which has been well controlled. Last A1c 6.2%.  Patient is dealing with situational stress with her husband has advanced dementia. She has complained of some depression symptoms and frequent anxiety symptoms and we recently started Lexapro 10 mg daily. She's not sure she has seen much benefit yet.  Patient also had substantial weight loss since last visit. She states her appetite is poor. She has almost 20 pound weight loss. She's not had any abdominal pain. Denies any cough.  Past Medical History:  Diagnosis Date  . Arthritis    r tkr  . Atrial fibrillation ([place])   . Breast cancer ([place])   . CVA (cerebral vascular accident) ([place]) 2008   mini stroke.no residual  . DEPRESSION 05/16/2009  . Diabetes mellitus type II   . Heart murmur    stress test 2009.dr peter [place]  . HYPERLIPIDEMIA 07/24/2008  . HYPERTENSION 07/24/2008  . HYPOTHYROIDISM 07/24/2008  . Intermittent vertigo   . Pneumonia   . Skin abnormality    facial lesions .pt applying mupiracin to areas  . Vertigo   . Wegener's disease, pulmonary ([place]) 10/2012   Past Surgical History:  Procedure Laterality Date  . BREAST SURGERY  2007   Lumpectomy, XRT 2006.l breast  . CHOLECYSTECTOMY  1980  . EXPLORATORY LAPAROTOMY    . HEEL SPUR SURGERY Right   . JOINT REPLACEMENT     r knee  . KNEE SURGERY  2009   TKR  . VIDEO ASSISTED THORACOSCOPY  04/22/2011   Procedure: VIDEO ASSISTED THORACOSCOPY;  Surgeon: Melrose Nakayama, MD;  Location: [facility];  Service: Thoracic;  Laterality: Left;  WITH BIOPSY    reports that she quit smoking about 33 years ago. Her smoking use included Cigarettes. She has a 6.00 pack-year smoking history. She has never used smokeless tobacco. She reports that she does not drink alcohol or use drugs. family history includes Arthritis in her mother; Breast cancer in her sister; Cancer in her sister; Coronary artery disease in her brother and father; Heart disease in her father; Lung cancer in her brother; Rheum arthritis in her mother. Allergies  Allergen Reactions  . Codeine Sulfate Other (See Comments)    REACTION: GI upset  . Sulfonamide Derivatives Other (See Comments)    REACTION: GI upset  . Vancomycin Other (See Comments)    Red man syndrome  . Atarax [Hydroxyzine] Nausea Only and Rash     Review of Systems  Constitutional: Negative for fatigue.  Eyes: Negative for visual disturbance.  Respiratory: Negative for cough, chest tightness, shortness of breath and wheezing.   Cardiovascular: Negative for chest pain, palpitations and leg swelling.  Endocrine: Negative for polydipsia and polyuria.  Neurological: Negative for dizziness, seizures, syncope, weakness, light-headedness and headaches.       Objective:   Physical Exam  Constitutional: She appears well-developed and well-nourished.  Neck: Neck supple. No thyromegaly present.  Cardiovascular: Normal rate and regular rhythm.   Pulmonary/Chest: Effort normal and breath  sounds normal. No respiratory distress. She has no wheezes. She has no rales.  Musculoskeletal: She exhibits no edema.  Lymphadenopathy:    She has no cervical adenopathy.       Assessment:     -#1 recent cellulitis right foot improved  #2 vaginal itching. Increased risk for yeast vaginitis with type 2 diabetes, chronic low-dose prednisone use and recent antibiotics  #3 type 2 diabetes which has been well controlled  #4 situational stress and anxiety  #5 history of  recurrent depression  #6 hx of chronic hyponatremia.    Plan:     -Flu vaccine given -Repeat basic metabolic panel -Fluconazole 150 mg 1 dose -Office follow-up in one month. If still losing weight at that point consider further evaluation possibly with CT abdomen and pelvis  [name] MD [facility] at [facility]

## 2016-01-01 DIAGNOSIS — E1122 Type 2 diabetes mellitus with diabetic chronic kidney disease: Secondary | ICD-10-CM | POA: Diagnosis not present

## 2016-01-01 DIAGNOSIS — L02611 Cutaneous abscess of right foot: Secondary | ICD-10-CM | POA: Diagnosis not present

## 2016-01-01 DIAGNOSIS — E1140 Type 2 diabetes mellitus with diabetic neuropathy, unspecified: Secondary | ICD-10-CM | POA: Diagnosis not present

## 2016-01-01 DIAGNOSIS — L03115 Cellulitis of right lower limb: Secondary | ICD-10-CM | POA: Diagnosis not present

## 2016-01-01 DIAGNOSIS — E11621 Type 2 diabetes mellitus with foot ulcer: Secondary | ICD-10-CM | POA: Diagnosis not present

## 2016-01-01 DIAGNOSIS — L97411 Non-pressure chronic ulcer of right heel and midfoot limited to breakdown of skin: Secondary | ICD-10-CM | POA: Diagnosis not present

## 2016-01-03 ENCOUNTER — Telehealth: Payer: Self-pay | Admitting: Family Medicine

## 2016-01-03 NOTE — Telephone Encounter (Signed)
Please see result note 

## 2016-01-03 NOTE — Telephone Encounter (Signed)
[name] [name] pt returned your call.

## 2016-01-04 ENCOUNTER — Other Ambulatory Visit: Payer: Self-pay

## 2016-01-04 DIAGNOSIS — E871 Hypo-osmolality and hyponatremia: Secondary | ICD-10-CM

## 2016-01-07 ENCOUNTER — Other Ambulatory Visit (INDEPENDENT_AMBULATORY_CARE_PROVIDER_SITE_OTHER): Payer: Medicare Other

## 2016-01-07 DIAGNOSIS — E871 Hypo-osmolality and hyponatremia: Secondary | ICD-10-CM

## 2016-01-08 DIAGNOSIS — E11621 Type 2 diabetes mellitus with foot ulcer: Secondary | ICD-10-CM | POA: Diagnosis not present

## 2016-01-08 DIAGNOSIS — L02611 Cutaneous abscess of right foot: Secondary | ICD-10-CM | POA: Diagnosis not present

## 2016-01-08 DIAGNOSIS — E1140 Type 2 diabetes mellitus with diabetic neuropathy, unspecified: Secondary | ICD-10-CM | POA: Diagnosis not present

## 2016-01-08 DIAGNOSIS — E1122 Type 2 diabetes mellitus with diabetic chronic kidney disease: Secondary | ICD-10-CM | POA: Diagnosis not present

## 2016-01-08 DIAGNOSIS — L97411 Non-pressure chronic ulcer of right heel and midfoot limited to breakdown of skin: Secondary | ICD-10-CM | POA: Diagnosis not present

## 2016-01-08 DIAGNOSIS — L03115 Cellulitis of right lower limb: Secondary | ICD-10-CM | POA: Diagnosis not present

## 2016-01-08 LAB — BASIC METABOLIC PANEL
BUN: 26 mg/dL — ABNORMAL HIGH (ref 6–23)
CHLORIDE: 95 meq/L — AB (ref 96–112)
CO2: 27 meq/L (ref 19–32)
CREATININE: 2.2 mg/dL — AB (ref 0.40–1.20)
Calcium: 8.5 mg/dL (ref 8.4–10.5)
GFR: 22.75 mL/min — ABNORMAL LOW (ref >60.00)
Glucose, Bld: 126 mg/dL — ABNORMAL HIGH (ref 70–99)
Potassium: 4.7 mEq/L (ref 3.5–5.1)
Sodium: 132 mEq/L — ABNORMAL LOW (ref 135–145)

## 2016-01-14 ENCOUNTER — Other Ambulatory Visit: Payer: Self-pay | Admitting: Family Medicine

## 2016-01-14 DIAGNOSIS — E1122 Type 2 diabetes mellitus with diabetic chronic kidney disease: Secondary | ICD-10-CM | POA: Diagnosis not present

## 2016-01-14 DIAGNOSIS — E11621 Type 2 diabetes mellitus with foot ulcer: Secondary | ICD-10-CM | POA: Diagnosis not present

## 2016-01-14 DIAGNOSIS — L02611 Cutaneous abscess of right foot: Secondary | ICD-10-CM | POA: Diagnosis not present

## 2016-01-14 DIAGNOSIS — L03115 Cellulitis of right lower limb: Secondary | ICD-10-CM | POA: Diagnosis not present

## 2016-01-14 DIAGNOSIS — L97411 Non-pressure chronic ulcer of right heel and midfoot limited to breakdown of skin: Secondary | ICD-10-CM | POA: Diagnosis not present

## 2016-01-14 DIAGNOSIS — E1140 Type 2 diabetes mellitus with diabetic neuropathy, unspecified: Secondary | ICD-10-CM | POA: Diagnosis not present

## 2016-01-15 ENCOUNTER — Ambulatory Visit (INDEPENDENT_AMBULATORY_CARE_PROVIDER_SITE_OTHER): Payer: Medicare Other | Admitting: Family Medicine

## 2016-01-15 VITALS — BP 130/70 | HR 61 | Temp 97.6°F | Ht 66.0 in | Wt 164.9 lb

## 2016-01-15 DIAGNOSIS — I10 Essential (primary) hypertension: Secondary | ICD-10-CM | POA: Diagnosis not present

## 2016-01-15 DIAGNOSIS — N179 Acute kidney failure, unspecified: Secondary | ICD-10-CM

## 2016-01-15 DIAGNOSIS — N183 Chronic kidney disease, stage 3 unspecified: Secondary | ICD-10-CM

## 2016-01-15 DIAGNOSIS — R634 Abnormal weight loss: Secondary | ICD-10-CM

## 2016-01-15 NOTE — Progress Notes (Signed)
Pre visit review using our clinic review tool, if applicable. No additional management support is needed unless otherwise documented below in the visit note.

## 2016-01-15 NOTE — Progress Notes (Signed)
Subjective:     Patient ID: [name] [name], female   DOB: [birth year], 82 y.o.   MRN: [id number]  HPI Patient seen for follow-up regarding acute on chronic renal failure. Her normal creatinine usually around 1.3. She had creatinine of 1.27 on 12/12/15. Repeat creatinine on 12/31/15 1.92 and repeat on 01/07/16 2.20.  She has multiple chronic problems including history of acute on chronic diastolic heart failure, atrial fibrillation, hypertension, pulmonary hypertension, Wegener's granulomatosis, COPD, type 2 diabetes, osteoarthritis.  She states she might not be drinking as much as usual recently. She's not taking any nonsteroidal anti-inflammatory medications. We instructed her week ago to discontinue Valsartan.   She's not noted any recent peripheral edema. She remains on furosemide 20 mgs daily and also takes Bystolic 10 mg daily for hypertension and amlodipine 5 mg daily. Denies any recent dyspnea.  Home blood pressures been stable around 120/60.  She has had fairly substantial weight loss in last couple months. She thinks this maybe stress related. Her husband has advancing Alzheimer's disease and has been difficult to deal with at times. We started Lexapro recently for depression. She thinks this might have helped her anxiety somewhat but not so much for depression. She does seem somewhat less depressed today and more interactive than she was a month ago.  Past Medical History:  Diagnosis Date  . Arthritis    r tkr  . Atrial fibrillation ([place])   . Breast cancer ([place])   . CVA (cerebral vascular accident) ([place]) 2008   mini stroke.no residual  . DEPRESSION 05/16/2009  . Diabetes mellitus type II   . Heart murmur    stress test 2009.dr peter [place]  . HYPERLIPIDEMIA 07/24/2008  . HYPERTENSION 07/24/2008  . HYPOTHYROIDISM 07/24/2008  . Intermittent vertigo   . Pneumonia   . Skin abnormality    facial lesions .pt applying mupiracin to areas  . Vertigo   . Wegener's disease, pulmonary ([place]) 10/2012    Past Surgical History:  Procedure Laterality Date  . BREAST SURGERY  2007   Lumpectomy, XRT 2006.l breast  . CHOLECYSTECTOMY  1980  . EXPLORATORY LAPAROTOMY    . HEEL SPUR SURGERY Right   . JOINT REPLACEMENT     r knee  . KNEE SURGERY  2009   TKR  . VIDEO ASSISTED THORACOSCOPY  04/22/2011   Procedure: VIDEO ASSISTED THORACOSCOPY;  Surgeon: Melrose Nakayama, MD;  Location: [facility];  Service: Thoracic;  Laterality: Left;  WITH BIOPSY    reports that she quit smoking about 33 years ago. Her smoking use included Cigarettes. She has a 6.00 pack-year smoking history. She has never used smokeless tobacco. She reports that she does not drink alcohol or use drugs. family history includes Arthritis in her mother; Breast cancer in her sister; Cancer in her sister; Coronary artery disease in her brother and father; Heart disease in her father; Lung cancer in her brother; Rheum arthritis in her mother. Allergies  Allergen Reactions  . Codeine Sulfate Other (See Comments)    REACTION: GI upset  . Sulfonamide Derivatives Other (See Comments)    REACTION: GI upset  . Vancomycin Other (See Comments)    Red man syndrome  . Atarax [Hydroxyzine] Nausea Only and Rash     Review of Systems  Constitutional: Positive for fatigue and unexpected weight change.  Eyes: Negative for visual disturbance.  Respiratory: Negative for cough, chest tightness, shortness of breath and wheezing.   Cardiovascular: Negative for chest pain, palpitations and leg swelling.  Gastrointestinal:  Negative for abdominal pain, blood in stool, diarrhea, nausea and vomiting.  Genitourinary: Negative for dysuria and hematuria.  Neurological: Negative for dizziness, seizures, syncope, weakness, light-headedness and headaches.       Objective:   Physical Exam  Constitutional: She is oriented to person, place, and time. She appears well-developed and well-nourished.  Neck: Neck supple. No thyromegaly present.   Cardiovascular: Normal rate and regular rhythm.   Pulmonary/Chest: Effort normal and breath sounds normal. No respiratory distress. She has no wheezes. She has no rales.  Musculoskeletal: She exhibits no edema.  Neurological: She is alert and oriented to person, place, and time.  Psychiatric: She has a normal mood and affect. Her behavior is normal.       Assessment:     #1 acute on chronic renal failure. Etiology unclear. She may have had some volume contraction with concomitant use of ARB which may have exacerbated. She has not had any recent nonsteroidal use or other clear provoking factors.  #2 hypertension stable since discontinuation of Valsartan.  #3 weight loss. Suspect possibly related to increased stress and depression issues. She does feel somewhat less anxious since starting Lexapro.      Plan:     -Repeat basic metabolic panel -Continue to hold valsartan. -May need to discontinue furosemide as well -Avoid non-steroidals -Consider renal ultrasound if creatinine not improving and consider nephrology consult  [name] MD [facility] at [facility]

## 2016-01-16 LAB — BASIC METABOLIC PANEL
BUN: 21 mg/dL (ref 6–23)
CHLORIDE: 96 meq/L (ref 96–112)
CO2: 29 mEq/L (ref 19–32)
Calcium: 9.4 mg/dL (ref 8.4–10.5)
Creatinine, Ser: 2.03 mg/dL — ABNORMAL HIGH (ref 0.40–1.20)
GFR: 24.96 mL/min — AB (ref >60.00)
Glucose, Bld: 82 mg/dL (ref 70–99)
POTASSIUM: 4.4 meq/L (ref 3.5–5.1)
SODIUM: 134 meq/L — AB (ref 135–145)

## 2016-01-24 ENCOUNTER — Other Ambulatory Visit: Payer: Medicare Other

## 2016-01-24 ENCOUNTER — Encounter: Payer: Self-pay | Admitting: Internal Medicine

## 2016-01-24 ENCOUNTER — Ambulatory Visit (INDEPENDENT_AMBULATORY_CARE_PROVIDER_SITE_OTHER): Payer: Medicare Other | Admitting: Internal Medicine

## 2016-01-24 ENCOUNTER — Ambulatory Visit (INDEPENDENT_AMBULATORY_CARE_PROVIDER_SITE_OTHER)
Admission: RE | Admit: 2016-01-24 | Discharge: 2016-01-24 | Disposition: A | Discharge status: Home / Self Care | Payer: Medicare Other | Source: Ambulatory Visit | Attending: Internal Medicine | Admitting: Internal Medicine

## 2016-01-24 VITALS — BP 126/88 | HR 67 | Ht 66.0 in | Wt 166.8 lb

## 2016-01-24 DIAGNOSIS — C7A029 Malignant carcinoid tumor of the large intestine, unspecified portion: Secondary | ICD-10-CM | POA: Diagnosis not present

## 2016-01-24 DIAGNOSIS — M3130 Wegener's granulomatosis without renal involvement: Secondary | ICD-10-CM

## 2016-01-24 NOTE — Assessment & Plan Note (Signed)
Incidental finding on lung biopsy with no recurrence.

## 2016-01-24 NOTE — Progress Notes (Signed)
F former smoker followed for bilateral pulmonary infiltrates/nodules, increased see-Anka, status post VATS biopsy/granulomatous polyangiitis (Wegener's) versus cryptogenic organizing pneumonia, complicated by metastatic intestinal carcinoid, history breast CVA 2006, A. fib/PE, HTN  07/11/14- 81 yoF former smoker followed for bilateral pulmonary infiltrates/ nodules, incr C-ANCA, s/p VATS bx/ Granulomatous Polyangiitis (Wegener's) vs cryptogenic organizing pneumonia/ BOOP, Metastatic intestinal carcinoid. Complicated by Hx breast Ca 2006. AFib/ PHTN Dr [place], DM     husband here FOLLOWS FOR: Pt states she is still using O2 2 sleep/ Advanced @ night. Pt states breathing has improved. Denies any SOB, cough, wheezing, and chest tightness/congestion. She would like not to need oxygen but is managing to do as instructed.- 2l w/ sleep CXR 04/04/24 IMPRESSION: Cardiomegaly, chronic. Bronchitis. No consolidation or collapse. Electronically Signed  By: Nelson Chimes M.D.  On: 04/04/2014 10:13  09/19/14- 53 yoF former smoker followed for bilateral pulmonary infiltrates/ nodules, incr C-ANCA, s/p VATS bx/ Granulomatous Polyangiitis (Wegener's) vs cryptogenic organizing pneumonia/ BOOP, Metastatic intestinal carcinoid. Complicated by Hx breast Ca 2006. AFib/ PHTN Dr [place], DM      FOLLOWS FOR:Had ONO done in April 2016- never rec'd call about this.Denies sob,wheezing. Occass. cough-dry.c/o had ha's and runny nose-clear. Takes Clartin.No nasal congestion,occass. Pnd. She doesn't feel she needs oxygen anymore and has tried without her 2 L/Advanced for sleep. We are calling to request report. Still are tolerating prednisone 5 mg with 10 mg every other day for Wegener's.  03/21/2015-82 year old female former smoker followed for bilateral pulmonary infiltrates/nodules, increased C-ANCA, status post VATS biopsy/granulomatous polyangiitis (Wegener's) versus cryptogenic organizing pneumonia/, metastatic intestinal  carcinoid, complicated by history breast CA 2006, A. fib/PE HTN/Dr. [place], DM FOLLOWS FOR: Pt would like to discuss O2 usage at night; Last ONO was 07-2014.  Says she is not smoking. Pneumonia and flu vaccines UTD No acute changes and she says she feels fine. Continues feeling more comfortable sleeping with oxygen 2 L/Advanced. Continues prednisone 5 mg alternating with 10 mg every other day for Wegener's granulomatosis.  01/24/2016- 82 year old female former smoker followed for bilateral pulmonary infiltrates/nodules, increased C-ANCA, status post VATS biopsy/granulomatous polyangiitis (Wegener's) versus cryptogenic organizing pneumonia/, metastatic intestinal carcinoid, complicated by history breast CA 2006, A. fib/PE HTN/,, DM O2 2 L Advanced FOLLOWS FOR: Pt. had her ONO, Pt. has a slight cough, Pt. deneis wheezing, chest pain  Overnight oximetry in December confirmed ongoing significant desaturation justifying continued use of oxygen for sleep. She has continued prednisone maintenance 5 mg daily for diagnosis of Wegener's. Status of this condition is unclear. She is coughing a little just with recent cool weather change but otherwise no routine cough, wheeze or respiratory discomfort. Want to relook at the status of her Wegener's diagnosis. No known recurrence of carcinoid.  ROS-see HPI Constitutional:   No-   weight loss, night sweats, fevers, chills, +fatigue, lassitude. HEENT:   No-  headaches, difficulty swallowing, tooth/dental problems, sore throat,       No-  sneezing, itching, ear ache, nasal congestion, post nasal drip,  CV: No -chest pain, orthopnea, PND, swelling in lower extremities, anasarca,    No-palpitations Resp:   shortness of breath with exertion or at rest.           No- productive cough,  No-non-productive cough,               No-   change in color of mucus.  No- wheezing.   Skin: No-   rash or lesions. no- pruritus GI:  No-   heartburn, indigestion, abdominal  pain,  nausea, vomiting,  GU: MS:  No-   joint pain or swelling.  Neuro-     +Vertigo- uses wheelchair Psych:  No- change in mood or affect. No depression or anxiety.  No memory loss.  OBJ General- Alert, Oriented, Affect-appropriate, Distress- none acute, overweight, +wheelchair     Skin- rash-none, lesions- none, excoriation- none Lymphadenopathy- none Head- atraumatic            Eyes- Gross vision intact, PERRLA, conjunctivae -pale            Ears- Hearing, canals-normal            Nose- turbinate edema, no-Septal dev, mucus, polyps, erosion, perforation             Throat- Mallampati II-III , mucosa clear , drainage- none, tonsils- atrophic Neck- flexible , trachea midline, no stridor , thyroid nl, carotid no bruit Chest - symmetrical excursion , unlabored           Heart/CV-+ slow irregularly irregular/ AFib , 2-3/6 precordial systolic murmur , no gallop  , no rub, Nl s1 s2                 - JVD+full , edema-none, stasis changes- none, varices- none           Lung- + bilateral squeaks, no wheeze , cough-none , dullness-none, rub- none           Chest wall- healed VATS thoracotomy incision L Abd-  +R para-umbilical hernia, Br/ Gen/ Rectal- Not done, not indicated Extrem- cyanosis- none, clubbing, none, atrophy- none, strength- nl. + Wheelchair for distance/ cane at home.  Neuro- grossly intact to observation

## 2016-01-24 NOTE — Patient Instructions (Signed)
Order- lab- ANCA    Dx Wegener's              CXR       Change prednisone to 5 mg every other day for 1 month. If you feel ok at the end of that time, then stop prednisone.  Please call as needed

## 2016-01-24 NOTE — Assessment & Plan Note (Signed)
Status of this diagnosis unclear. Originally elevated c-ANCA. Plan-repeat ANCA, CXR. Change prednisone to 5 mg every other day for one month then stop.

## 2016-01-25 LAB — C-ANCA TITER

## 2016-01-25 LAB — ANCA SCREEN W REFLEX TITER: ANCA SCREEN: POSITIVE — AB

## 2016-01-29 ENCOUNTER — Ambulatory Visit: Payer: Medicare Other | Admitting: Family Medicine

## 2016-01-29 ENCOUNTER — Telehealth: Payer: Self-pay | Admitting: Family Medicine

## 2016-01-29 MED ORDER — GLIMEPIRIDE 2 MG PO TABS: 2.0000 mg | ORAL_TABLET | Freq: Every day | ORAL | 0 refills | Status: DC

## 2016-01-29 NOTE — Telephone Encounter (Signed)
Medication sent in for patient.

## 2016-01-29 NOTE — Telephone Encounter (Signed)
Pt states her rx   glimepiride (AMARYL) 2 MG tablet (60 days)  was accidentally thrown away . 60 days because pt just picked up a 90 day supply and had put the 30 day in the cabinet, but the 60 day was left in a bag that got throun away.  Pt has enough for the next 30 days, but will run out since she just picked up the 90 day.  Would like to know if you would call in the 60 day to  [facility] [address]  Pt aware Dr Elease Hashimoto is out this week, and that she will have to pay out of pocket.

## 2016-01-30 ENCOUNTER — Other Ambulatory Visit: Payer: Medicare Other

## 2016-02-06 ENCOUNTER — Ambulatory Visit (INDEPENDENT_AMBULATORY_CARE_PROVIDER_SITE_OTHER): Payer: Medicare Other | Admitting: Family Medicine

## 2016-02-06 VITALS — BP 158/80 | HR 57 | Temp 97.7°F | Ht 66.0 in | Wt 166.0 lb

## 2016-02-06 DIAGNOSIS — N189 Chronic kidney disease, unspecified: Principal | ICD-10-CM

## 2016-02-06 DIAGNOSIS — I129 Hypertensive chronic kidney disease with stage 1 through stage 4 chronic kidney disease, or unspecified chronic kidney disease: Secondary | ICD-10-CM

## 2016-02-06 DIAGNOSIS — N184 Chronic kidney disease, stage 4 (severe): Secondary | ICD-10-CM

## 2016-02-06 DIAGNOSIS — N179 Acute kidney failure, unspecified: Secondary | ICD-10-CM | POA: Diagnosis not present

## 2016-02-06 NOTE — Patient Instructions (Signed)
Monitor blood pressure at home and be in touch if consistently > 140/90.

## 2016-02-06 NOTE — Progress Notes (Addendum)
Subjective:     Patient ID: [name] [name], female   DOB: 1932/07/02, 82 y.o.   MRN: [id number]  HPI Patient seen for follow-up regarding acute on chronic kidney disease. Recent creatinine over 2. We discontinued her furosemide and valsartan. She is not currently monitoring blood pressure at home but on recent visit here blood pressures very stable. She has not had any recurrent lower extremity edema. She has been conscious to stay well-hydrated. No recent nonsteroidal use. Currently on amlodipine and Byystolic for hypertension.  Patient is under increased stress regarding her husband has dementia.  Past Medical History:  Diagnosis Date  . Arthritis    r tkr  . Atrial fibrillation ([place])   . Breast cancer ([facility])   . CVA (cerebral vascular accident) ([place]) 2008   mini stroke.no residual  . DEPRESSION 05/16/2009  . Diabetes mellitus type II   . Heart murmur    stress test 2009.dr peter [place]  . HYPERLIPIDEMIA 07/24/2008  . HYPERTENSION 07/24/2008  . HYPOTHYROIDISM 07/24/2008  . Intermittent vertigo   . Pneumonia   . Skin abnormality    facial lesions .pt applying mupiracin to areas  . Vertigo   . Wegener's disease, pulmonary ([place]) 10/2012   Past Surgical History:  Procedure Laterality Date  . BREAST SURGERY  2007   Lumpectomy, XRT 2006.l breast  . CHOLECYSTECTOMY  1980  . EXPLORATORY LAPAROTOMY    . HEEL SPUR SURGERY Right   . JOINT REPLACEMENT     r knee  . KNEE SURGERY  2009   TKR  . VIDEO ASSISTED THORACOSCOPY  04/22/2011   Procedure: VIDEO ASSISTED THORACOSCOPY;  Surgeon: Melrose Nakayama, MD;  Location: [facility];  Service: Thoracic;  Laterality: Left;  WITH BIOPSY    reports that she quit smoking about 33 years ago. Her smoking use included Cigarettes. She has a 6.00 pack-year smoking history. She has never used smokeless tobacco. She reports that she does not drink alcohol or use drugs. family history includes Arthritis in her mother; Breast cancer in her sister; Cancer in her  sister; Coronary artery disease in her brother and father; Heart disease in her father; Lung cancer in her brother; Rheum arthritis in her mother. Allergies  Allergen Reactions  . Codeine Sulfate Other (See Comments)    REACTION: GI upset  . Sulfonamide Derivatives Other (See Comments)    REACTION: GI upset  . Vancomycin Other (See Comments)    Red man syndrome  . Atarax [Hydroxyzine] Nausea Only and Rash     Review of Systems  Constitutional: Negative for fatigue.  Eyes: Negative for visual disturbance.  Respiratory: Negative for cough, chest tightness, shortness of breath and wheezing.   Cardiovascular: Negative for chest pain, palpitations and leg swelling.  Neurological: Negative for dizziness, seizures, syncope, weakness, light-headedness and headaches.       Objective:   Physical Exam  Constitutional: She appears well-developed and well-nourished.  Cardiovascular: Normal rate and regular rhythm.   Pulmonary/Chest: Effort normal and breath sounds normal. No respiratory distress. She has no wheezes. She has no rales.  Musculoskeletal: She exhibits no edema.       Assessment:     #1 recent acute on chronic kidney injury.  She has CKD stage 3 (at baseline) and stage 4 by most recent lab prior to today.    #2 hypertension with elevated reading today but recently well controlled    Plan:     -Recheck basic metabolic panel -Monitor blood pressure closely at home and  be in touch if consistently greater than 140/90 -Watch sodium intake closely -Routine follow-up in 2 months  [name] MD [facility]

## 2016-02-06 NOTE — Progress Notes (Signed)
Pre visit review using our clinic review tool, if applicable. No additional management support is needed unless otherwise documented below in the visit note.

## 2016-02-07 LAB — BASIC METABOLIC PANEL
BUN: 19 mg/dL (ref 6–23)
CHLORIDE: 99 meq/L (ref 96–112)
CO2: 26 meq/L (ref 19–32)
Calcium: 9.5 mg/dL (ref 8.4–10.5)
Creatinine, Ser: 1.65 mg/dL — ABNORMAL HIGH (ref 0.40–1.20)
GFR: 31.7 mL/min — ABNORMAL LOW (ref >60.00)
Glucose, Bld: 52 mg/dL — ABNORMAL LOW (ref 70–99)
POTASSIUM: 4.2 meq/L (ref 3.5–5.1)
SODIUM: 132 meq/L — AB (ref 135–145)

## 2016-02-23 ENCOUNTER — Other Ambulatory Visit: Payer: Self-pay | Admitting: Cardiology

## 2016-02-25 ENCOUNTER — Telehealth: Payer: Self-pay | Admitting: Cardiology

## 2016-02-25 NOTE — Telephone Encounter (Signed)
Pt is aware Eliquis samples at front desk for pick up

## 2016-02-25 NOTE — Telephone Encounter (Signed)
New message     Patient calling the office for samples of medication:   1.  What medication and dosage are you requesting samples for? ELIQUIS 2.5 MG TABS tablet  2.  Are you currently out of this medication? yes

## 2016-02-27 ENCOUNTER — Telehealth: Payer: Self-pay | Admitting: Family Medicine

## 2016-02-27 NOTE — Telephone Encounter (Signed)
Spoke with patient in regards to scheduling awv. Patient stated that she will give the office a call back to schedule awv appt.

## 2016-03-07 HISTORY — PX: TRANSTHORACIC ECHOCARDIOGRAM: SHX275

## 2016-03-12 NOTE — Progress Notes (Deleted)
[address] [place], [place]  [address] Phone: [phone] Fax:  [phone]  Date:  03/12/2016   ID:  [name] [name], DOB [birth year], MRN [id number]  PCP:  Kristian Covey, MD    History of Present Illness: [name] [name] is a 82 y.o. female with a hx of HTN, DM2, prior TIA, HL, breast CA, atrial fibrillation.  She is on Eliquis (CHADS2-VASc=6) for anticoagulation. She has a  dx of granulomatosis with polyangiitis (Wegener's).   Echo 04/14/13  EF 60-65%, trivial AI, mild-mod MR, mod to severe LAE, mild RVE, mod RAE, moderate TR,  PASP- 60 mm Hg.  She was admitted in September with a diabetic foot ulcer and cellulitis. Noted to be in Afib with controlled rate.   On follow up today she is doing well from a cardiac standpoint. She denies any chest pain, SOB, or palpitations. She is bothered by diffuse itching. She has some mild bruising. She is concerned about her husband's declining health.   Wt Readings from Last 3 Encounters:  02/06/16 166 lb (75.3 kg)  01/24/16 166 lb 12.8 oz (75.7 kg)  01/15/16 164 lb 14.4 oz (74.8 kg)     Past Medical History:  Diagnosis Date  . Arthritis    r tkr  . Atrial fibrillation ([facility])   . Breast cancer ([facility])   . CVA (cerebral vascular accident) ([facility]) 2008   mini stroke.no residual  . DEPRESSION 05/16/2009  . Diabetes mellitus type II   . Heart murmur    stress test 2009.dr Aishah Teffeteller [place]  . HYPERLIPIDEMIA 07/24/2008  . HYPERTENSION 07/24/2008  . HYPOTHYROIDISM 07/24/2008  . Intermittent vertigo   . Pneumonia   . Skin abnormality    facial lesions .pt applying mupiracin to areas  . Vertigo   . Wegener's disease, pulmonary ([facility]) 10/2012    Current Outpatient Prescriptions  Medication Sig Dispense Refill  . amLODipine (NORVASC) 5 MG tablet TAKE ONE TABLET BY MOUTH ONCE DAILY (Patient taking differently: TAKE 5mg  TABLET BY MOUTH ONCE DAILY) 30 tablet 11  . BYSTOLIC 10 MG tablet TAKE ONE TABLET BY MOUTH ONCE DAILY AFTER  BREAKFAST.  (Patient taking differently: TAKE 10mg  TABLET BY MOUTH ONCE DAILY AFTER  BREAKFAST.) 30 tablet 11  . CALCIUM CITRATE PO Take 1,200 mg by mouth daily with lunch.     . cholecalciferol (VITAMIN D) 1000 UNITS tablet Take 3,000 Units by mouth daily with lunch.     . diazepam (VALIUM) 5 MG tablet Take one tablet by mouth as needed for severe vertigo flares (Patient taking differently: Take 5 mg by mouth daily as needed (severe vertigo.Marland Kitchen). ) 20 tablet 0  . doxycycline (VIBRA-TABS) 100 MG tablet Take 1 tablet (100 mg total) by mouth 2 (two) times daily. 14 tablet 0  . ELIQUIS 2.5 MG TABS tablet TAKE ONE TABLET BY MOUTH TWICE DAILY 180 tablet 1  . escitalopram (LEXAPRO) 10 MG tablet Take 1 tablet (10 mg total) by mouth daily. 30 tablet 6  . gabapentin (NEURONTIN) 100 MG capsule TAKE ONE CAPSULE BY MOUTH TWICE DAILY BEFORE A  MEAL 60 capsule 5  . glimepiride (AMARYL) 2 MG tablet Take 1 tablet (2 mg total) by mouth daily with breakfast. TAKE ONE TABLET BY MOUTH ONCE DAILY BEFORE BREAKFAST 60 tablet 0  . levothyroxine (SYNTHROID, LEVOTHROID) 100 MCG tablet Take 1 tablet (100 mcg total) by mouth daily before breakfast. 90 tablet 2  . loratadine (CLARITIN) 10 MG tablet Take 10 mg by mouth daily as  needed for allergies.    . predniSONE (DELTASONE) 5 MG tablet Take 1 tablet (5 mg total) by mouth daily with breakfast. 30 tablet 5  . triamcinolone cream (KENALOG) 0.1 % Apply 1 application topically 2 (two) times daily as needed. (Patient taking differently: Apply 1 application topically 2 (two) times daily as needed (rash/irritation). ) 453 g 1   No current facility-administered medications for this visit.     Allergies:    Allergies  Allergen Reactions  . Codeine Sulfate Other (See Comments)    REACTION: GI upset  . Sulfonamide Derivatives Other (See Comments)    REACTION: GI upset  . Vancomycin Other (See Comments)    Red man syndrome  . Atarax [Hydroxyzine] Nausea Only and Rash    Social History:  The  patient  reports that she quit smoking about 33 years ago. Her smoking use included Cigarettes. She has a 6.00 pack-year smoking history. She has never used smokeless tobacco. She reports that she does not drink alcohol or use drugs.   ROS:  Please see the history of present illness.   No hemoptysis.   All other systems reviewed and negative.   PHYSICAL EXAM: VS:  There were no vitals taken for this visit. Well nourished, well developed, in no acute distress. She is seen in a wheelchair. HEENT: normal  Neck: no JVD or bruits Cardiac:  normal S1, S2; irregularly irregular rhythm; no murmur or gallop Lungs:  Decreased breath sounds bilaterally. Scant crackles in bases. Abd: soft, nontender  Ext: tr ankle edema  Skin: warm and dry  Neuro:  CNs 2-12 intact, no focal abnormalities noted  Laboratory data:  Lab Results  Component Value Date   WBC 7.3 12/13/2015   HGB 9.2 (L) 12/13/2015   HCT 28.2 (L) 12/13/2015   PLT 245 12/13/2015   GLUCOSE 52 (L) 02/06/2016   CHOL 132 11/30/2015   TRIG 100.0 11/30/2015   HDL 60.00 11/30/2015   LDLDIRECT 75.3 10/21/2011   LDLCALC 52 11/30/2015   ALT 10 (L) 12/11/2015   AST 19 12/11/2015   NA 132 (L) 02/06/2016   K 4.2 02/06/2016   CL 99 02/06/2016   CREATININE 1.65 (H) 02/06/2016   BUN 19 02/06/2016   CO2 26 02/06/2016   TSH 0.19 (L) 11/30/2015   INR 1.70 (H) 08/18/2013   HGBA1C 6.2 11/30/2015   MICROALBUR 1.7 08/15/2009     ASSESSMENT AND PLAN:  1. Atrial Fibrillation:  Rate controlled.    She is on the correct dose of Eliquis for her (age > 80 and weight > 60 kg).  CHADS2-VASc=6.  She is asymptomatic. Continue current therapy.  2. Pulmonary HTN:  Likely related to sequelae of Wegener's granulomatosis. 3. Hypertension:  BP well controlled.  Continue to monitor. 4. Hyperlipidemia:  Continue statin. 5. Granulomatosis with Polyangiitis (Wegener's):  Continue follow up with pulmonary and oncology as planned  6. Cronic diastolic dysfunction.  Well compensated

## 2016-03-14 ENCOUNTER — Ambulatory Visit: Payer: Medicare Other | Admitting: Cardiology

## 2016-04-01 ENCOUNTER — Telehealth: Payer: Self-pay | Admitting: Family Medicine

## 2016-04-01 NOTE — Telephone Encounter (Signed)
Noted.

## 2016-04-01 NOTE — Telephone Encounter (Signed)
Patient Name: [name] [name] DOB: July 15, 1932 Initial Comment Caller states the back of her neck has been hurting for about a week and her body is sore, and wants to know if she can get an appointment. Throat is sore as well. Caller is on oxygen at night. Nurse Assessment Nurse: Ronnald Ramp, RN, Miranda Date/Time (Eastern Time): 04/01/2016 10:03:31 AM Confirm and document reason for call. If symptomatic, describe symptoms. ---Caller states she has had neck pain and sinus congestion for about 1 week. The last few days she has had sore throat and body aches. Denies fever. Does the patient have any new or worsening symptoms? ---Yes Will a triage be completed? ---Yes Related visit to physician within the last 2 weeks? ---No Does the PT have any chronic conditions? (i.e. diabetes, asthma, etc.) ---Yes List chronic conditions. ---O2, Diabetes, COPD, HTN, Thyroid, Depression, CHF Is this a behavioral health or substance abuse call? ---No Guidelines Guideline Title Affirmed Question Affirmed Notes Sore Throat Earache also present Neck Pain or Stiffness [1] MODERATE neck pain (e.g., interferes with normal activities AND [2] present > 3 days Final Disposition User See Physician within 24 Hours Ronnald Ramp, RN, Miranda Comments Appt scheduled for tomorrow at 11:45am with Dr. Elease Hashimoto Referrals REFERRED TO PCP OFFICE Disagree/Comply: Comply Disagree/Comply: Comply

## 2016-04-02 ENCOUNTER — Inpatient Hospital Stay (HOSPITAL_COMMUNITY)
Admission: EM | Admit: 2016-04-02 | Discharge: 2016-04-05 | DRG: 193 | Disposition: A | Discharge status: Home Health | Payer: Medicare Other | Attending: Family Medicine | Admitting: Family Medicine

## 2016-04-02 ENCOUNTER — Emergency Department (HOSPITAL_COMMUNITY): Payer: Medicare Other

## 2016-04-02 ENCOUNTER — Encounter (HOSPITAL_COMMUNITY): Payer: Self-pay | Admitting: *Deleted

## 2016-04-02 ENCOUNTER — Ambulatory Visit: Payer: Self-pay | Admitting: Family Medicine

## 2016-04-02 DIAGNOSIS — J189 Pneumonia, unspecified organism: Secondary | ICD-10-CM

## 2016-04-02 DIAGNOSIS — R05 Cough: Secondary | ICD-10-CM | POA: Diagnosis not present

## 2016-04-02 DIAGNOSIS — I129 Hypertensive chronic kidney disease with stage 1 through stage 4 chronic kidney disease, or unspecified chronic kidney disease: Secondary | ICD-10-CM | POA: Diagnosis present

## 2016-04-02 DIAGNOSIS — Z87891 Personal history of nicotine dependence: Secondary | ICD-10-CM

## 2016-04-02 DIAGNOSIS — Z8249 Family history of ischemic heart disease and other diseases of the circulatory system: Secondary | ICD-10-CM

## 2016-04-02 DIAGNOSIS — J9621 Acute and chronic respiratory failure with hypoxia: Secondary | ICD-10-CM | POA: Diagnosis present

## 2016-04-02 DIAGNOSIS — I482 Chronic atrial fibrillation, unspecified: Secondary | ICD-10-CM | POA: Diagnosis present

## 2016-04-02 DIAGNOSIS — I5032 Chronic diastolic (congestive) heart failure: Secondary | ICD-10-CM | POA: Diagnosis present

## 2016-04-02 DIAGNOSIS — Z888 Allergy status to other drugs, medicaments and biological substances status: Secondary | ICD-10-CM

## 2016-04-02 DIAGNOSIS — Z881 Allergy status to other antibiotic agents status: Secondary | ICD-10-CM

## 2016-04-02 DIAGNOSIS — J440 Chronic obstructive pulmonary disease with acute lower respiratory infection: Secondary | ICD-10-CM | POA: Diagnosis present

## 2016-04-02 DIAGNOSIS — E785 Hyperlipidemia, unspecified: Secondary | ICD-10-CM | POA: Diagnosis present

## 2016-04-02 DIAGNOSIS — Z801 Family history of malignant neoplasm of trachea, bronchus and lung: Secondary | ICD-10-CM

## 2016-04-02 DIAGNOSIS — I4891 Unspecified atrial fibrillation: Secondary | ICD-10-CM | POA: Diagnosis present

## 2016-04-02 DIAGNOSIS — E039 Hypothyroidism, unspecified: Secondary | ICD-10-CM | POA: Diagnosis present

## 2016-04-02 DIAGNOSIS — Z885 Allergy status to narcotic agent status: Secondary | ICD-10-CM

## 2016-04-02 DIAGNOSIS — E872 Acidosis: Secondary | ICD-10-CM | POA: Diagnosis not present

## 2016-04-02 DIAGNOSIS — J9620 Acute and chronic respiratory failure, unspecified whether with hypoxia or hypercapnia: Secondary | ICD-10-CM | POA: Diagnosis present

## 2016-04-02 DIAGNOSIS — Z794 Long term (current) use of insulin: Secondary | ICD-10-CM

## 2016-04-02 DIAGNOSIS — Z882 Allergy status to sulfonamides status: Secondary | ICD-10-CM

## 2016-04-02 DIAGNOSIS — J181 Lobar pneumonia, unspecified organism: Secondary | ICD-10-CM | POA: Diagnosis not present

## 2016-04-02 DIAGNOSIS — M3130 Wegener's granulomatosis without renal involvement: Secondary | ICD-10-CM | POA: Diagnosis present

## 2016-04-02 DIAGNOSIS — Z8673 Personal history of transient ischemic attack (TIA), and cerebral infarction without residual deficits: Secondary | ICD-10-CM

## 2016-04-02 DIAGNOSIS — Z7901 Long term (current) use of anticoagulants: Secondary | ICD-10-CM

## 2016-04-02 DIAGNOSIS — Z853 Personal history of malignant neoplasm of breast: Secondary | ICD-10-CM

## 2016-04-02 DIAGNOSIS — N183 Chronic kidney disease, stage 3 (moderate): Secondary | ICD-10-CM | POA: Diagnosis present

## 2016-04-02 DIAGNOSIS — Z7952 Long term (current) use of systemic steroids: Secondary | ICD-10-CM

## 2016-04-02 DIAGNOSIS — I700 Atherosclerosis of aorta: Secondary | ICD-10-CM | POA: Diagnosis present

## 2016-04-02 DIAGNOSIS — E1122 Type 2 diabetes mellitus with diabetic chronic kidney disease: Secondary | ICD-10-CM | POA: Diagnosis present

## 2016-04-02 DIAGNOSIS — R110 Nausea: Secondary | ICD-10-CM | POA: Diagnosis not present

## 2016-04-02 DIAGNOSIS — F329 Major depressive disorder, single episode, unspecified: Secondary | ICD-10-CM | POA: Diagnosis present

## 2016-04-02 DIAGNOSIS — R0902 Hypoxemia: Secondary | ICD-10-CM | POA: Diagnosis not present

## 2016-04-02 DIAGNOSIS — E860 Dehydration: Secondary | ICD-10-CM | POA: Diagnosis present

## 2016-04-02 DIAGNOSIS — Z8261 Family history of arthritis: Secondary | ICD-10-CM

## 2016-04-02 DIAGNOSIS — Z96659 Presence of unspecified artificial knee joint: Secondary | ICD-10-CM | POA: Diagnosis present

## 2016-04-02 DIAGNOSIS — Z803 Family history of malignant neoplasm of breast: Secondary | ICD-10-CM

## 2016-04-02 LAB — URINALYSIS, ROUTINE W REFLEX MICROSCOPIC
BILIRUBIN URINE: NEGATIVE
GLUCOSE, UA: NEGATIVE mg/dL
Ketones, ur: NEGATIVE mg/dL
NITRITE: NEGATIVE
PROTEIN: 30 mg/dL — AB
SPECIFIC GRAVITY, URINE: 1.014 (ref 1.005–1.030)
pH: 6 (ref 5.0–8.0)

## 2016-04-02 LAB — INFLUENZA PANEL BY PCR (TYPE A & B)
INFLAPCR: NEGATIVE
INFLBPCR: NEGATIVE
Influenza A By PCR: NEGATIVE
Influenza B By PCR: NEGATIVE

## 2016-04-02 LAB — CBC WITH DIFFERENTIAL/PLATELET
BASOS ABS: 0 10*3/uL (ref 0.0–0.1)
BASOS PCT: 0 %
Eosinophils Absolute: 0 10*3/uL (ref 0.0–0.7)
Eosinophils Relative: 0 %
HEMATOCRIT: 24.7 % — AB (ref 36.0–46.0)
Hemoglobin: 8.3 g/dL — ABNORMAL LOW (ref 12.0–15.0)
LYMPHS PCT: 4 %
Lymphs Abs: 0.4 10*3/uL — ABNORMAL LOW (ref 0.7–4.0)
MCH: 32.3 pg (ref 26.0–34.0)
MCHC: 33.6 g/dL (ref 30.0–36.0)
MCV: 96.1 fL (ref 78.0–100.0)
MONO ABS: 1.5 10*3/uL — AB (ref 0.1–1.0)
Monocytes Relative: 13 %
NEUTROS ABS: 9.7 10*3/uL — AB (ref 1.7–7.7)
NEUTROS PCT: 83 %
Platelets: 190 10*3/uL (ref 150–400)
RBC: 2.57 MIL/uL — AB (ref 3.87–5.11)
RDW: 14 % (ref 11.5–15.5)
WBC: 11.7 10*3/uL — AB (ref 4.0–10.5)

## 2016-04-02 LAB — COMPREHENSIVE METABOLIC PANEL
ALBUMIN: 3.2 g/dL — AB (ref 3.5–5.0)
ALK PHOS: 54 U/L (ref 38–126)
ALT: 9 U/L — ABNORMAL LOW (ref 14–54)
ANION GAP: 7 (ref 5–15)
AST: 19 U/L (ref 15–41)
BILIRUBIN TOTAL: 1.7 mg/dL — AB (ref 0.3–1.2)
BUN: 21 mg/dL — ABNORMAL HIGH (ref 6–20)
CALCIUM: 8.3 mg/dL — AB (ref 8.9–10.3)
CO2: 25 mmol/L (ref 22–32)
Chloride: 100 mmol/L — ABNORMAL LOW (ref 101–111)
Creatinine, Ser: 1.47 mg/dL — ABNORMAL HIGH (ref 0.44–1.00)
GFR, EST AFRICAN AMERICAN: 37 mL/min — AB (ref >60)
GFR, EST NON AFRICAN AMERICAN: 32 mL/min — AB (ref >60)
Glucose, Bld: 150 mg/dL — ABNORMAL HIGH (ref 65–99)
POTASSIUM: 4.1 mmol/L (ref 3.5–5.1)
Sodium: 132 mmol/L — ABNORMAL LOW (ref 135–145)
TOTAL PROTEIN: 6.5 g/dL (ref 6.5–8.1)

## 2016-04-02 LAB — I-STAT CG4 LACTIC ACID, ED
LACTIC ACID, VENOUS: 2 mmol/L — AB (ref 0.5–1.9)
Lactic Acid, Venous: 1.94 mmol/L (ref 0.5–1.9)

## 2016-04-02 LAB — PROTIME-INR
INR: 1.75
Prothrombin Time: 20.7 seconds — ABNORMAL HIGH (ref 11.4–15.2)

## 2016-04-02 LAB — I-STAT TROPONIN, ED: Troponin i, poc: 0.02 ng/mL (ref 0.00–0.08)

## 2016-04-02 LAB — GLUCOSE, CAPILLARY
GLUCOSE-CAPILLARY: 424 mg/dL — AB (ref 65–99)
GLUCOSE-CAPILLARY: 363 mg/dL — AB (ref 65–99)

## 2016-04-02 LAB — BRAIN NATRIURETIC PEPTIDE: B Natriuretic Peptide: 1332.1 pg/mL — ABNORMAL HIGH (ref 0.0–100.0)

## 2016-04-02 LAB — LACTIC ACID, PLASMA: Lactic Acid, Venous: 2 mmol/L (ref 0.5–1.9)

## 2016-04-02 LAB — PROCALCITONIN: PROCALCITONIN: 3.22 ng/mL

## 2016-04-02 LAB — LIPASE, BLOOD: Lipase: 26 U/L (ref 11–51)

## 2016-04-02 LAB — APTT: aPTT: 39 seconds — ABNORMAL HIGH (ref 24–36)

## 2016-04-02 MED ORDER — ALBUTEROL SULFATE (2.5 MG/3ML) 0.083% IN NEBU
5.0000 mg | INHALATION_SOLUTION | Freq: Once | RESPIRATORY_TRACT | Status: AC
  Administered 2016-04-02: 5 mg via RESPIRATORY_TRACT
  Filled 2016-04-02: qty 6

## 2016-04-02 MED ORDER — SODIUM CHLORIDE 0.9 % IV BOLUS (SEPSIS)
500.0000 mL | Freq: Once | INTRAVENOUS | Status: AC
  Administered 2016-04-02: 500 mL via INTRAVENOUS

## 2016-04-02 MED ORDER — INSULIN ASPART 100 UNIT/ML ~~LOC~~ SOLN
0.0000 [IU] | Freq: Every day | SUBCUTANEOUS | Status: DC
  Administered 2016-04-02: 5 [IU] via SUBCUTANEOUS

## 2016-04-02 MED ORDER — SODIUM CHLORIDE 0.9% FLUSH: 3.0000 mL | INTRAVENOUS | Status: DC | PRN

## 2016-04-02 MED ORDER — INSULIN ASPART 100 UNIT/ML ~~LOC~~ SOLN
0.0000 [IU] | Freq: Three times a day (TID) | SUBCUTANEOUS | Status: DC
  Administered 2016-04-03: 1 [IU] via SUBCUTANEOUS
  Administered 2016-04-03 – 2016-04-04 (×3): 2 [IU] via SUBCUTANEOUS

## 2016-04-02 MED ORDER — ACETAMINOPHEN 325 MG PO TABS: 650.0000 mg | ORAL_TABLET | Freq: Four times a day (QID) | ORAL | Status: DC | PRN

## 2016-04-02 MED ORDER — INSULIN ASPART 100 UNIT/ML ~~LOC~~ SOLN
7.0000 [IU] | Freq: Once | SUBCUTANEOUS | Status: AC
  Administered 2016-04-02: 7 [IU] via SUBCUTANEOUS

## 2016-04-02 MED ORDER — APIXABAN 2.5 MG PO TABS
2.5000 mg | ORAL_TABLET | Freq: Two times a day (BID) | ORAL | Status: DC
  Administered 2016-04-02 – 2016-04-05 (×6): 2.5 mg via ORAL
  Filled 2016-04-02 (×7): qty 1

## 2016-04-02 MED ORDER — FUROSEMIDE 10 MG/ML IJ SOLN
20.0000 mg | Freq: Once | INTRAMUSCULAR | Status: AC
  Administered 2016-04-02: 20 mg via INTRAVENOUS
  Filled 2016-04-02: qty 4

## 2016-04-02 MED ORDER — LEVOTHYROXINE SODIUM 100 MCG PO TABS
100.0000 ug | ORAL_TABLET | Freq: Every day | ORAL | Status: DC
  Administered 2016-04-03 – 2016-04-05 (×3): 100 ug via ORAL
  Filled 2016-04-02 (×3): qty 1

## 2016-04-02 MED ORDER — SODIUM CHLORIDE 0.9% FLUSH
3.0000 mL | Freq: Two times a day (BID) | INTRAVENOUS | Status: DC
  Administered 2016-04-02 – 2016-04-04 (×4): 3 mL via INTRAVENOUS

## 2016-04-02 MED ORDER — FUROSEMIDE 10 MG/ML IJ SOLN: 20.0000 mg | Freq: Once | INTRAMUSCULAR | Status: DC

## 2016-04-02 MED ORDER — GABAPENTIN 100 MG PO CAPS
100.0000 mg | ORAL_CAPSULE | Freq: Two times a day (BID) | ORAL | Status: DC
  Administered 2016-04-02 – 2016-04-05 (×6): 100 mg via ORAL
  Filled 2016-04-02 (×6): qty 1

## 2016-04-02 MED ORDER — AMLODIPINE BESYLATE 5 MG PO TABS
5.0000 mg | ORAL_TABLET | Freq: Every day | ORAL | Status: DC
  Administered 2016-04-02 – 2016-04-05 (×4): 5 mg via ORAL
  Filled 2016-04-02 (×4): qty 1

## 2016-04-02 MED ORDER — ONDANSETRON HCL 4 MG/2ML IJ SOLN
4.0000 mg | Freq: Four times a day (QID) | INTRAMUSCULAR | Status: DC | PRN
  Administered 2016-04-03: 4 mg via INTRAVENOUS
  Filled 2016-04-02: qty 2

## 2016-04-02 MED ORDER — NEBIVOLOL HCL 10 MG PO TABS
10.0000 mg | ORAL_TABLET | Freq: Every day | ORAL | Status: DC
  Administered 2016-04-03 – 2016-04-05 (×3): 10 mg via ORAL
  Filled 2016-04-02 (×3): qty 1

## 2016-04-02 MED ORDER — DEXTROSE 5 % IV SOLN
500.0000 mg | Freq: Once | INTRAVENOUS | Status: AC
  Administered 2016-04-02: 500 mg via INTRAVENOUS
  Filled 2016-04-02: qty 500

## 2016-04-02 MED ORDER — ONDANSETRON HCL 4 MG PO TABS: 4.0000 mg | ORAL_TABLET | Freq: Four times a day (QID) | ORAL | Status: DC | PRN

## 2016-04-02 MED ORDER — ACETAMINOPHEN 650 MG RE SUPP: 650.0000 mg | Freq: Four times a day (QID) | RECTAL | Status: DC | PRN

## 2016-04-02 MED ORDER — ESCITALOPRAM OXALATE 10 MG PO TABS
10.0000 mg | ORAL_TABLET | Freq: Every day | ORAL | Status: DC
  Administered 2016-04-02 – 2016-04-05 (×4): 10 mg via ORAL
  Filled 2016-04-02 (×5): qty 1

## 2016-04-02 MED ORDER — AZITHROMYCIN 250 MG PO TABS
500.0000 mg | ORAL_TABLET | ORAL | Status: DC
  Administered 2016-04-03 – 2016-04-04 (×2): 500 mg via ORAL
  Filled 2016-04-02 (×2): qty 2

## 2016-04-02 MED ORDER — DEXTROSE 5 % IV SOLN
1.0000 g | INTRAVENOUS | Status: DC
  Administered 2016-04-03: 1 g via INTRAVENOUS
  Filled 2016-04-02 (×2): qty 10

## 2016-04-02 MED ORDER — METHYLPREDNISOLONE SODIUM SUCC 125 MG IJ SOLR
125.0000 mg | Freq: Once | INTRAMUSCULAR | Status: AC
  Administered 2016-04-02: 125 mg via INTRAVENOUS
  Filled 2016-04-02: qty 2

## 2016-04-02 MED ORDER — SODIUM CHLORIDE 0.9 % IV SOLN
250.0000 mL | INTRAVENOUS | Status: DC | PRN
Start: 2016-04-02 — End: 2016-04-05

## 2016-04-02 MED ORDER — PREDNISONE 5 MG PO TABS
5.0000 mg | ORAL_TABLET | ORAL | Status: DC
  Administered 2016-04-02 – 2016-04-04 (×2): 5 mg via ORAL
  Filled 2016-04-02 (×2): qty 1

## 2016-04-02 MED ORDER — DEXTROSE 5 % IV SOLN
1.0000 g | Freq: Once | INTRAVENOUS | Status: AC
  Administered 2016-04-02: 1 g via INTRAVENOUS
  Filled 2016-04-02: qty 10

## 2016-04-02 NOTE — ED Notes (Signed)
Patient informed for the need of Urine

## 2016-04-02 NOTE — H&P (Signed)
History and Physical  [name] [name] WNU:[id number] DOB: 1932-05-13 DOA: 04/02/2016  PCP: [name], MD  Patient coming from: home  Chief Complaint: vomiting  HPI:  82 year old woman presented with several day history of fatigue, poor oral intake, vomiting, intermittent shortness of breath and cough. Imaging revealed left upper lobe pneumonia, lactic acid was modestly elevated.  Patient reports illness began approximately 3 days ago with fatigue, intermittent shortness of breath, chills without definite fever, cough. December 25 very tired and did very little. 12/26 felt poorly had intermittent vomiting and other symptoms as above. Vomited today, fatigue, generalized weakness worsening. Took Mucinex which did not help. No other specific aggravating or alleviating factors noted. According to the chart EMS reported "fever 105".   ED Course: Low-grade fever in the emergency department. Hypertensive. SPO2 98% on 3 L. Respirations 18-22. Pulse 74-78. Treated with steroids, albuterol, Zithromax, ceftriaxone, IV fluids. Pertinent labs: Complete metabolic panel unremarkable. Lactic acid modestly elevated, 2.00. Troponin negative. Hemoglobin stable, 8.3. WBC elevated, 0.7. Platelets normal. Influenza PCR negative.urinalysis equivocal. EKG: Independently reviewed. Atrial fibrillation, normal rate and axis. Cannot exclude anterior MI, age unknown. Imaging: Chest x-ray independently reviewed: Left upper lobe pneumonia. Cannot exclude a component of CHF. Agree with radiology interpretation, including Aortic atherosclerosis.  Review of Systems:  Negative for new visual changes, sore throat, rash, new muscle aches, chest pain,  dysuria, bleeding, abdominal pain.  Positive for fever, chills. Reports bowels moving.  Past Medical History:  Diagnosis Date  . Arthritis    r tkr  . Atrial fibrillation ([place])   . Breast cancer ([place])   . CVA (cerebral vascular accident) ([place]) 2008   mini stroke.no  residual  . DEPRESSION 05/16/2009  . Diabetes mellitus type II   . Heart murmur    stress test 2009.dr peter [place]  . HYPERLIPIDEMIA 07/24/2008  . HYPERTENSION 07/24/2008  . HYPOTHYROIDISM 07/24/2008  . Intermittent vertigo   . Pneumonia   . Skin abnormality    facial lesions .pt applying mupiracin to areas  . Vertigo   . Wegener's disease, pulmonary ([place]) 10/2012    Past Surgical History:  Procedure Laterality Date  . BREAST SURGERY  2007   Lumpectomy, XRT 2006.l breast  . CHOLECYSTECTOMY  1980  . EXPLORATORY LAPAROTOMY    . HEEL SPUR SURGERY Right   . JOINT REPLACEMENT     r knee  . KNEE SURGERY  2009   TKR  . VIDEO ASSISTED THORACOSCOPY  04/22/2011   Procedure: VIDEO ASSISTED THORACOSCOPY;  Surgeon: Melrose Nakayama, MD;  Location: [facility];  Service: Thoracic;  Laterality: Left;  WITH BIOPSY     reports that she quit smoking about 33 years ago. Her smoking use included Cigarettes. She has a 6.00 pack-year smoking history. She has never used smokeless tobacco. She reports that she does not drink alcohol or use drugs.   Allergies  Allergen Reactions  . Codeine Sulfate Other (See Comments)    REACTION: GI upset  . Sulfonamide Derivatives Other (See Comments)    REACTION: GI upset  . Vancomycin Other (See Comments)    Red man syndrome  . Atarax [Hydroxyzine] Nausea Only and Rash    Family History  Problem Relation Age of Onset  . Arthritis Mother   . Rheum arthritis Mother   . Heart disease Father   . Coronary artery disease Father   . Cancer Sister     breast CA, both sisters  . Breast cancer Sister   . Coronary artery disease  Brother   . Lung cancer Brother      Prior to Admission medications   Medication Sig Start Date End Date Taking? Authorizing Provider  amLODipine (NORVASC) 5 MG tablet TAKE ONE TABLET BY MOUTH ONCE DAILY Patient taking differently: TAKE 72m TABLET BY MOUTH ONCE DAILY in the evening 04/11/15  Yes Bruce WDan Europe MD  BYSTOLIC 10 MG  tablet TAKE ONE TABLET BY MOUTH ONCE DAILY AFTER  BREAKFAST. Patient taking differently: TAKE 116mTABLET BY MOUTH ONCE DAILY AFTER  BREAKFAST. 11/26/15  Yes BrEulas PostMD  CALCIUM CITRATE PO Take 1,200 mg by mouth daily with lunch.    Yes Historical Provider, MD  cholecalciferol (VITAMIN D) 1000 UNITS tablet Take 3,000 Units by mouth daily with lunch.    Yes Historical Provider, MD  diazepam (VALIUM) 5 MG tablet Take one tablet by mouth as needed for severe vertigo flares Patient taking differently: Take 5 mg by mouth daily as needed (severe vertigo..)Marland Kitchen  11/15/15  Yes BrEulas PostMD  ELIQUIS 2.5 MG TABS tablet TAKE ONE TABLET BY MOUTH TWICE DAILY Patient taking differently: TAKE 2.68m51mABLET BY MOUTH TWICE DAILY 02/25/16  Yes Peter M JorMartiniqueD  escitalopram (LEXAPRO) 10 MG tablet Take 1 tablet (10 mg total) by mouth daily. 11/30/15  Yes BruEulas PostD  gabapentin (NEURONTIN) 100 MG capsule TAKE ONE CAPSULE BY MOUTH TWICE DAILY BEFORE A  MEAL Patient taking differently: TAKE 100m51mPSULE BY MOUTH TWICE DAILY BEFORE A  MEAL 01/14/16  Yes BrucEulas Post  glimepiride (AMARYL) 2 MG tablet Take 1 tablet (2 mg total) by mouth daily with breakfast. TAKE ONE TABLET BY MOUTH ONCE DAILY BEFORE BREAKFAST 01/29/16  Yes BrucEulas Post  guaiFENesin (MUCINEX) 600 MG 12 hr tablet Take 600 mg by mouth 2 (two) times daily as needed for cough or to loosen phlegm.   Yes Historical Provider, MD  levothyroxine (SYNTHROID, LEVOTHROID) 100 MCG tablet Take 1 tablet (100 mcg total) by mouth daily before breakfast. 12/04/15  Yes BrucEulas Post  loratadine (CLARITIN) 10 MG tablet Take 10 mg by mouth daily as needed for allergies.   Yes Historical Provider, MD  predniSONE (DELTASONE) 5 MG tablet Take 1 tablet (5 mg total) by mouth daily with breakfast. Patient taking differently: Take 5 mg by mouth every other day.  07/18/15  Yes ClinDeneise Lever  doxycycline (VIBRA-TABS) 100 MG tablet  Take 1 tablet (100 mg total) by mouth 2 (two) times daily. Patient not taking: Reported on 04/02/2016 12/13/15   IskrTheodis Blaze  triamcinolone cream (KENALOG) 0.1 % Apply 1 application topically 2 (two) times daily as needed. Patient not taking: Reported on 04/02/2016 05/15/15   BrucEulas Post    Physical Exam: Vitals:   04/02/16 0938 04/02/16 1028 04/02/16 1101 04/02/16 1159  BP: 125/57   159/67  Pulse: 74   88  Resp: 18   22  Temp: 99.9 F (37.7 C)   99.3 F (37.4 C)  TempSrc: Oral     SpO2: 90%  92% 98%  Weight:  73.5 kg (162 lb)    Height:  _0  (1.676 m)      Constitutional:  . Appears calm and comfortableCalm and nontoxic  . Eyes: Pupils and irises appear normal . Normal lids ENMT:  . external ears, nose appear normal . grossly normal hearing Neck:  . neck appears normal, no masses . no thyromegaly Respiratory: Bilateral posterior crackles, fair  air movement. No frank wheezes, rales or rhonchi.  Marland Kitchen Respiratory effort normal.  Cardiovascular:  . RRR, no m/r/g . No LE extremity edema   Abdomen: Appearance unremarkable;o tenderness or masses . Periumbilical hernia noted, soft, nontender Musculoskeletal:  . Digits/nails of bilateral hands appear unremarkable  . RUE, LUE, RLE, LLE   o strength and tone normal, no atrophy, no abnormal movements o No tenderness, masses Skin:  . No rashes, lesions, ulcers . palpation of skin: no induration or nodules Psychiatric:  . judgement and insight appear normal . Mental status o Mood, affect appropriate  Wt Readings from Last 3 Encounters:  04/02/16 73.5 kg (162 lb)  02/06/16 75.3 kg (166 lb)  01/24/16 75.7 kg (166 lb 12.8 oz)    I have personally reviewed following labs and imaging studies  Labs on Admission:  CBC:  Recent Labs Lab 04/02/16 1022  WBC 11.7*  NEUTROABS 9.7*  HGB 8.3*  HCT 24.7*  MCV 96.1  PLT 253   Basic Metabolic Panel:  Recent Labs Lab 04/02/16 1022  NA 132*  K 4.1  CL 100*    CO2 25  GLUCOSE 150*  BUN 21*  CREATININE 1.47*  CALCIUM 8.3*   Liver Function Tests:  Recent Labs Lab 04/02/16 1022  AST 19  ALT 9*  ALKPHOS 54  BILITOT 1.7*  PROT 6.5  ALBUMIN 3.2*    Recent Labs Lab 04/02/16 1022  LIPASE 26   Urine analysis:    Component Value Date/Time   COLORURINE YELLOW 04/02/2016 1150   APPEARANCEUR CLEAR 04/02/2016 1150   LABSPEC 1.014 04/02/2016 1150   PHURINE 6.0 04/02/2016 1150   GLUCOSEU NEGATIVE 04/02/2016 1150   HGBUR SMALL (A) 04/02/2016 1150   BILIRUBINUR NEGATIVE 04/02/2016 1150   BILIRUBINUR n 12/30/2010 1700   KETONESUR NEGATIVE 04/02/2016 1150   PROTEINUR 30 (A) 04/02/2016 1150   UROBILINOGEN 1.0 09/25/2014 1550   NITRITE NEGATIVE 04/02/2016 1150   LEUKOCYTESUR LARGE (A) 04/02/2016 1150    Radiological Exams on Admission: Dg Chest Port 1 View  Result Date: 04/02/2016 CLINICAL DATA:  82 year old female with history of nausea, vomiting, cough and fever worsening over the past 3 days. Unable to keep food or liquids down. EXAM: PORTABLE CHEST 1 VIEW COMPARISON:  Chest x-ray 11/24/2015. FINDINGS: New area of airspace consolidation in the periphery of the left lung, likely in the left upper lobe. Diffuse peribronchial cuffing. Cephalization of the pulmonary vasculature with indistinct interstitial markings, suggesting a background of mild interstitial pulmonary edema. No definite pleural effusions. Mild cardiomegaly. The patient is rotated to the right on today's exam, resulting in distortion of the mediastinal contours and reduced diagnostic sensitivity and specificity for mediastinal pathology. Atherosclerosis in the thoracic aorta. IMPRESSION: 1. New airspace consolidation in the periphery of the left lung, likely in the left upper lobe, concerning for pneumonia. 2. There are is also evidence suggestive of a background of mild congestive heart failure, as above. 3. Aortic atherosclerosis. Electronically Signed   By: Vinnie Langton  M.D.   On: 04/02/2016 10:18    Active Problems:   CAP (community acquired pneumonia)   Assessment/Plan 1. Lobar pneumonia .Lactic acid modestly elevated but now trending down. Not hypotensive or tachycardic. She has had poor oral intake and appears to be dehydrated. Most likely elevated lactic acidosis secondary to this rather than sepsis. Only one definite SIRS criterion (RR). If had true fever, then would technically qualify for sepsis with modest elevation of lactic acid. However does not appear toxic at  this point. Appears stable for admission to medical floor.  Lactic acid trending down with IV fluids. Likely related to dehydration and poor oral intake rather than sepsis.  Check sputum culture. Empiric antibiotics.   Check influenza 2. Acute hypoxic respiratory failure secondary to lobar pneumonia. She has chronic nighttime hypoxia.   Continue supplemental oxygen  3. Possible CHF. Type unknown. Previous echocardiogram unremarkable. Lung exam does correlate with chest x-ray findings. She does not have evidence of obvious volume overload. Troponin negative and no evidence of ACS. EKG nonacute.  Check BNP  Lasix 1  Check echocardiogram 4. Diabetes mellitus type 2. Random glucose 150. Anion gap 7.Appears stable.   Sliding scale insulin.  5. PMH Wegener's disease, pulmonary, atrial fibrillation, chronic kidney disease stage III, COPD, Chronic steroid use  Continue prednisone.  Continue Eliquis  6. Aortic atherosclerosis.   DVT prophylaxis: Enoxaparin  Code Status: full  Family Communication: husband and son at bedside Consults called: none    It is my clinical opinion that admission to INPATIENT is reasonable and necessary in this patient . presenting with signs/symptoms of hypoxia, fatigue, fever, hypoxia, concerning for sepsis, pneumonia  . in the context of PMH including: Chronic hypoxic respiratory failure , diabetes mellitus type 2, Wegener's disease (pulmonary),  COPD, chronic steroid use . with pertinent positives on physical exam including: Bilateral inspiratory crackles posteriorly . and pertinent positives on radiographic and laboratory data including: Left upper lobe infiltrate  Given the aforementioned, the predictability of an adverse outcome is felt to be significant. I expect that the patient will require at least 2 midnights in the hospital to treat this condition.   Time spent: 55 minutes  Murray Hodgkins, MD  [facility] Direct contact: [phone] --[place] [place]  --[email or web address]; password [email or web address]  7PM-7AM contact night coverage as above  04/02/2016, 1:23 PM

## 2016-04-02 NOTE — ED Notes (Signed)
Pt made aware of need for urine specimen

## 2016-04-02 NOTE — ED Provider Notes (Signed)
[facility] DEPT Provider Note   CSN: [id number] Arrival date & time: 04/02/16  2482     History   Chief Complaint Chief Complaint  Patient presents with  . Influenza    HPI [name] [name] is a 82 y.o. female.  HPI Patient presents with 3 days of fever, nausea and vomiting. States she's been unable to tolerate liquids or solids. No bowel movement for several days. Fever was up to 105 per EMS. Given 1000 mg of Tylenol before arrival. Patient states she's had neck pain and headache of this is currently resolved. No neck stiffness. She's had nasal congestion and chest congestion with difficulty taking full breaths. Minimal cough. No new lower extremity swelling or pain. Denies abdominal pain or urinary symptoms. No known sick contacts. No new rashes. Past Medical History:  Diagnosis Date  . Arthritis    r tkr  . Atrial fibrillation ([place])   . Breast cancer ([place])   . CVA (cerebral vascular accident) ([place]) 2008   mini stroke.no residual  . DEPRESSION 05/16/2009  . Diabetes mellitus type II   . Heart murmur    stress test 2009.dr peter [place]  . HYPERLIPIDEMIA 07/24/2008  . HYPERTENSION 07/24/2008  . HYPOTHYROIDISM 07/24/2008  . Intermittent vertigo   . Pneumonia   . Skin abnormality    facial lesions .pt applying mupiracin to areas  . Vertigo   . Wegener's disease, pulmonary ([place]) 10/2012    Patient Active Problem List   Diagnosis Date Noted  . Cellulitis and abscess   . Diabetic foot ulcer ([place]) 12/10/2015  . Cellulitis and abscess of foot 12/10/2015  . AKI (acute kidney injury) ([place]) 09/25/2014  . Hereditary and idiopathic peripheral neuropathy 09/25/2014  . Dependent edema 09/25/2014  . Acute kidney injury ([place])   . UTI (lower urinary tract infection)   . Type 2 diabetes mellitus, controlled ([place]) 07/19/2014  . Acute on chronic respiratory failure ([place]) 04/04/2014  . Chronic atrial fibrillation ([place]) 04/04/2014  . CKD (chronic kidney disease) stage 3, GFR 30-59  ml/min 04/04/2014  . Abdominal hernia without obstruction or gangrene 01/09/2014  . Nausea alone 08/21/2013  . CAP (community acquired pneumonia) 08/18/2013  . Diastolic CHF, chronic ([place]) 08/18/2013  . COPD (chronic obstructive pulmonary disease) ([place]) 08/18/2013  . Sepsis ([place]) 08/18/2013  . Hand pain, left 08/18/2013  . COPD with acute exacerbation ([place]) 05/10/2013  . Chronic kidney disease (CKD), stage IV (severe) ([place]) 05/08/2013  . Normocytic anemia 05/08/2013  . Acute on chronic diastolic heart failure ([place]) 05/07/2013  . Pulmonary hypertension 05/07/2013  . Right-sided heart failure 05/07/2013  . Pancreatic cyst 04/17/2013  . Acute respiratory failure ([place]) 04/13/2013  . Obesity (BMI 30-39.9) 12/28/2012  . Wegener's granulomatosis (granulomatosis with polyangiitis) ([place]) 11/02/2012  . Abnormal echocardiogram 11/02/2012  . Cough with hemoptysis 11/01/2012  . Anemia 11/01/2012  . Dehydration 11/01/2012  . Acute renal failure ([place]) 11/01/2012  . Diffuse pulmonary alveolar hemorrhage 11/01/2012  . Breast cancer ([place]) 09/14/2012  . Pruritus of skin 05/20/2012  . Carcinoid tumor of colon, malignant ([place]) 05/16/2011  . Cryptogenic organizing pneumonia 04/05/2011  . Vertigo, intermittent 10/21/2010  . Type 2 diabetes mellitus ([place]) 06/04/2010  . ABDOMINAL PAIN, LEFT LOWER QUADRANT 04/18/2010  . Edema 03/25/2010  . Hyponatremia 02/13/2010  . DEPRESSION 05/16/2009  . ABDOMINAL PAIN 03/08/2009  . Hypothyroidism 07/24/2008  . Hyperlipidemia 07/24/2008  . Essential hypertension 07/24/2008  . OSTEOARTHRITIS 07/24/2008    Past Surgical History:  Procedure Laterality Date  . BREAST SURGERY  2007   Lumpectomy, XRT 2006.l breast  . CHOLECYSTECTOMY  1980  . EXPLORATORY LAPAROTOMY    . HEEL SPUR SURGERY Right   . JOINT REPLACEMENT     r knee  . KNEE SURGERY  2009   TKR  . VIDEO ASSISTED THORACOSCOPY  04/22/2011   Procedure: VIDEO ASSISTED THORACOSCOPY;  Surgeon: Melrose Nakayama, MD;  Location: [facility];  Service: Thoracic;  Laterality: Left;  WITH BIOPSY    OB History    No data available       Home Medications    Prior to Admission medications   Medication Sig Start Date End Date Taking? Authorizing Provider  amLODipine (NORVASC) 5 MG tablet TAKE ONE TABLET BY MOUTH ONCE DAILY Patient taking differently: TAKE 4m TABLET BY MOUTH ONCE DAILY in the evening 04/11/15  Yes Bruce WDan Europe MD  BYSTOLIC 10 MG tablet TAKE ONE TABLET BY MOUTH ONCE DAILY AFTER  BREAKFAST. Patient taking differently: TAKE 160mTABLET BY MOUTH ONCE DAILY AFTER  BREAKFAST. 11/26/15  Yes BrEulas PostMD  CALCIUM CITRATE PO Take 1,200 mg by mouth daily with lunch.    Yes Historical Provider, MD  cholecalciferol (VITAMIN D) 1000 UNITS tablet Take 3,000 Units by mouth daily with lunch.    Yes Historical Provider, MD  diazepam (VALIUM) 5 MG tablet Take one tablet by mouth as needed for severe vertigo flares Patient taking differently: Take 5 mg by mouth daily as needed (severe vertigo..)Marland Kitchen  11/15/15  Yes BrEulas PostMD  ELIQUIS 2.5 MG TABS tablet TAKE ONE TABLET BY MOUTH TWICE DAILY Patient taking differently: TAKE 2.62m862mABLET BY MOUTH TWICE DAILY 02/25/16  Yes Peter M JorMartiniqueD  escitalopram (LEXAPRO) 10 MG tablet Take 1 tablet (10 mg total) by mouth daily. 11/30/15  Yes BruEulas PostD  gabapentin (NEURONTIN) 100 MG capsule TAKE ONE CAPSULE BY MOUTH TWICE DAILY BEFORE A  MEAL Patient taking differently: TAKE 100m53mPSULE BY MOUTH TWICE DAILY BEFORE A  MEAL 01/14/16  Yes BrucEulas Post  glimepiride (AMARYL) 2 MG tablet Take 1 tablet (2 mg total) by mouth daily with breakfast. TAKE ONE TABLET BY MOUTH ONCE DAILY BEFORE BREAKFAST 01/29/16  Yes BrucEulas Post  guaiFENesin (MUCINEX) 600 MG 12 hr tablet Take 600 mg by mouth 2 (two) times daily as needed for cough or to loosen phlegm.   Yes Historical Provider, MD  levothyroxine (SYNTHROID, LEVOTHROID) 100 MCG  tablet Take 1 tablet (100 mcg total) by mouth daily before breakfast. 12/04/15  Yes BrucEulas Post  loratadine (CLARITIN) 10 MG tablet Take 10 mg by mouth daily as needed for allergies.   Yes Historical Provider, MD  predniSONE (DELTASONE) 5 MG tablet Take 1 tablet (5 mg total) by mouth daily with breakfast. Patient taking differently: Take 5 mg by mouth every other day.  07/18/15  Yes ClinDeneise Lever  doxycycline (VIBRA-TABS) 100 MG tablet Take 1 tablet (100 mg total) by mouth 2 (two) times daily. Patient not taking: Reported on 04/02/2016 12/13/15   IskrTheodis Blaze  triamcinolone cream (KENALOG) 0.1 % Apply 1 application topically 2 (two) times daily as needed. Patient not taking: Reported on 04/02/2016 05/15/15   BrucEulas Post    Family History Family History  Problem Relation Age of Onset  . Arthritis Mother   . Rheum arthritis Mother   . Heart disease Father   . Coronary artery disease Father   . Cancer Sister  breast CA, both sisters  . Breast cancer Sister   . Coronary artery disease Brother   . Lung cancer Brother     Social History Social History  Substance Use Topics  . Smoking status: Former Smoker    Packs/day: 0.50    Years: 12.00    Types: Cigarettes    Quit date: 06/04/1982  . Smokeless tobacco: Never Used  . Alcohol use No     Allergies   Codeine sulfate; Sulfonamide derivatives; Vancomycin; and Atarax [hydroxyzine]   Review of Systems Review of Systems  Constitutional: Positive for activity change, appetite change, chills, fatigue and fever.  HENT: Positive for congestion. Negative for sinus pain and sore throat.   Respiratory: Positive for cough and shortness of breath.   Cardiovascular: Negative for chest pain, palpitations and leg swelling.  Gastrointestinal: Positive for nausea and vomiting. Negative for abdominal pain and diarrhea.  Genitourinary: Negative for dysuria, flank pain and hematuria.  Musculoskeletal: Positive for  myalgias and neck pain. Negative for arthralgias and neck stiffness.  Skin: Negative for rash and wound.  Neurological: Positive for headaches. Negative for dizziness, weakness, light-headedness and numbness.  All other systems reviewed and are negative.    Physical Exam Updated Vital Signs BP 125/57   Pulse 74   Temp 99.9 F (37.7 C) (Oral)   Resp 18   Ht _0  (1.676 m)   Wt 162 lb (73.5 kg)   SpO2 92%   BMI 26.15 kg/m   Physical Exam  Constitutional: She is oriented to person, place, and time. She appears well-developed and well-nourished. No distress.  HENT:  Head: Normocephalic and atraumatic.  Mouth/Throat: Oropharynx is clear and moist.  Eyes: EOM are normal. Pupils are equal, round, and reactive to light.  Neck: Normal range of motion. Neck supple.  No meningismus. No anterior cervical lymphadenopathy.  Cardiovascular: Normal rate and intact distal pulses.   Irregularly irregular  Pulmonary/Chest: Effort normal and breath sounds normal.  Expiratory wheezes scattered throughout.  Abdominal: Soft. Bowel sounds are normal. There is no tenderness. There is no rebound and no guarding.  Musculoskeletal: Normal range of motion. She exhibits no edema or tenderness.  No lower extremity swelling, asymmetry or tenderness.  Neurological: She is alert and oriented to person, place, and time.  5/5 motor in all extremities. Sensation is grossly intact.  Skin: Skin is warm and dry. Capillary refill takes less than 2 seconds. No rash noted. She is not diaphoretic. No erythema.  Psychiatric: She has a normal mood and affect. Her behavior is normal.  Nursing note and vitals reviewed.    ED Treatments / Results  Labs (all labs ordered are listed, but only abnormal results are displayed) Labs Reviewed  COMPREHENSIVE METABOLIC PANEL - Abnormal; Notable for the following:       Result Value   Sodium 132 (*)    Chloride 100 (*)    Glucose, Bld 150 (*)    BUN 21 (*)    Creatinine,  Ser 1.47 (*)    Calcium 8.3 (*)    Albumin 3.2 (*)    ALT 9 (*)    Total Bilirubin 1.7 (*)    GFR calc non Af Amer 32 (*)    GFR calc Af Amer 37 (*)    All other components within normal limits  CBC WITH DIFFERENTIAL/PLATELET - Abnormal; Notable for the following:    WBC 11.7 (*)    RBC 2.57 (*)    Hemoglobin 8.3 (*)    HCT  24.7 (*)    Neutro Abs 9.7 (*)    Lymphs Abs 0.4 (*)    Monocytes Absolute 1.5 (*)    All other components within normal limits  I-STAT CG4 LACTIC ACID, ED - Abnormal; Notable for the following:    Lactic Acid, Venous 2.00 (*)    All other components within normal limits  CULTURE, BLOOD (ROUTINE X 2)  CULTURE, BLOOD (ROUTINE X 2)  URINE CULTURE  LIPASE, BLOOD  INFLUENZA PANEL BY PCR (TYPE A & B, H1N1)  URINALYSIS, ROUTINE W REFLEX MICROSCOPIC  I-STAT TROPOININ, ED    EKG  EKG Interpretation  Date/Time:  Wednesday April 02 2016 09:57:30 EST Ventricular Rate:  71 PR Interval:    QRS Duration: 108 QT Interval:  403 QTC Calculation: 438 R Axis:   5 Text Interpretation:  Atrial fibrillation Borderline T wave abnormalities Confirmed by Lita Mains  MD, Alease Fait ([id number]) on 04/02/2016 11:58:58 AM       Radiology Dg Chest Port 1 View  Result Date: 04/02/2016 CLINICAL DATA:  82 year old female with history of nausea, vomiting, cough and fever worsening over the past 3 days. Unable to keep food or liquids down. EXAM: PORTABLE CHEST 1 VIEW COMPARISON:  Chest x-ray 11/24/2015. FINDINGS: New area of airspace consolidation in the periphery of the left lung, likely in the left upper lobe. Diffuse peribronchial cuffing. Cephalization of the pulmonary vasculature with indistinct interstitial markings, suggesting a background of mild interstitial pulmonary edema. No definite pleural effusions. Mild cardiomegaly. The patient is rotated to the right on today's exam, resulting in distortion of the mediastinal contours and reduced diagnostic sensitivity and specificity for  mediastinal pathology. Atherosclerosis in the thoracic aorta. IMPRESSION: 1. New airspace consolidation in the periphery of the left lung, likely in the left upper lobe, concerning for pneumonia. 2. There are is also evidence suggestive of a background of mild congestive heart failure, as above. 3. Aortic atherosclerosis. Electronically Signed   By: Vinnie Langton M.D.   On: 04/02/2016 10:18    Procedures Procedures (including critical care time)  Medications Ordered in ED Medications  cefTRIAXone (ROCEPHIN) 1 g in dextrose 5 % 50 mL IVPB (not administered)  azithromycin (ZITHROMAX) 500 mg in dextrose 5 % 250 mL IVPB (500 mg Intravenous New Bag/Given 04/02/16 1132)  sodium chloride 0.9 % bolus 500 mL (500 mLs Intravenous New Bag/Given 04/02/16 1104)  albuterol (PROVENTIL) (2.5 MG/3ML) 0.083% nebulizer solution 5 mg (5 mg Nebulization Given 04/02/16 1059)  methylPREDNISolone sodium succinate (SOLU-MEDROL) 125 mg/2 mL injection 125 mg (125 mg Intravenous Given 04/02/16 1044)     Initial Impression / Assessment and Plan / ED Course  I have reviewed the triage vital signs and the nursing notes.  Pertinent labs & imaging results that were available during my care of the patient were reviewed by me and considered in my medical decision making (see chart for details).  Clinical Course   Injured on chest x-ray with mild elevation in white blood cell count. We'll start on antibiotics and discussed with hospitalist regarding admission.    Final Clinical Impressions(s) / ED Diagnoses   Final diagnoses:  Community acquired pneumonia, unspecified laterality    New Prescriptions New Prescriptions   No medications on file     Julianne Rice, MD 04/02/16 1159

## 2016-04-02 NOTE — ED Triage Notes (Signed)
Patient is from home with complaints of nausea, vomiting fever and cough x3 days.  Patient has received 1055m of tylenol for fever.  461mof zofran.  20g in R AC.  Patient has Hx of breast cancer on the left side.

## 2016-04-02 NOTE — Progress Notes (Signed)
RN received report from ED, Pt arrived unit. Alert and oriented, able to communicate needs, will continue with current plan of care.

## 2016-04-02 NOTE — ED Notes (Addendum)
Lactic acid duplicate order.  At 13:36

## 2016-04-02 NOTE — ED Notes (Signed)
Bed: BS96 Expected date:  Expected time:  Means of arrival:  Comments: EMS-SOB

## 2016-04-03 ENCOUNTER — Observation Stay (HOSPITAL_COMMUNITY): Payer: Medicare Other

## 2016-04-03 DIAGNOSIS — E860 Dehydration: Secondary | ICD-10-CM | POA: Diagnosis present

## 2016-04-03 DIAGNOSIS — J9621 Acute and chronic respiratory failure with hypoxia: Secondary | ICD-10-CM

## 2016-04-03 DIAGNOSIS — J181 Lobar pneumonia, unspecified organism: Principal | ICD-10-CM

## 2016-04-03 DIAGNOSIS — Z7952 Long term (current) use of systemic steroids: Secondary | ICD-10-CM | POA: Diagnosis not present

## 2016-04-03 DIAGNOSIS — M3130 Wegener's granulomatosis without renal involvement: Secondary | ICD-10-CM | POA: Diagnosis present

## 2016-04-03 DIAGNOSIS — I700 Atherosclerosis of aorta: Secondary | ICD-10-CM | POA: Diagnosis present

## 2016-04-03 DIAGNOSIS — I4891 Unspecified atrial fibrillation: Secondary | ICD-10-CM | POA: Diagnosis present

## 2016-04-03 DIAGNOSIS — E872 Acidosis: Secondary | ICD-10-CM | POA: Diagnosis present

## 2016-04-03 DIAGNOSIS — N183 Chronic kidney disease, stage 3 (moderate): Secondary | ICD-10-CM | POA: Diagnosis present

## 2016-04-03 DIAGNOSIS — I129 Hypertensive chronic kidney disease with stage 1 through stage 4 chronic kidney disease, or unspecified chronic kidney disease: Secondary | ICD-10-CM | POA: Diagnosis present

## 2016-04-03 DIAGNOSIS — I482 Chronic atrial fibrillation: Secondary | ICD-10-CM | POA: Diagnosis not present

## 2016-04-03 DIAGNOSIS — I5032 Chronic diastolic (congestive) heart failure: Secondary | ICD-10-CM | POA: Diagnosis present

## 2016-04-03 DIAGNOSIS — E785 Hyperlipidemia, unspecified: Secondary | ICD-10-CM | POA: Diagnosis present

## 2016-04-03 DIAGNOSIS — Z7901 Long term (current) use of anticoagulants: Secondary | ICD-10-CM | POA: Diagnosis not present

## 2016-04-03 DIAGNOSIS — J989 Respiratory disorder, unspecified: Secondary | ICD-10-CM | POA: Diagnosis not present

## 2016-04-03 DIAGNOSIS — F329 Major depressive disorder, single episode, unspecified: Secondary | ICD-10-CM | POA: Diagnosis present

## 2016-04-03 DIAGNOSIS — J440 Chronic obstructive pulmonary disease with acute lower respiratory infection: Secondary | ICD-10-CM | POA: Diagnosis present

## 2016-04-03 DIAGNOSIS — E039 Hypothyroidism, unspecified: Secondary | ICD-10-CM | POA: Diagnosis present

## 2016-04-03 DIAGNOSIS — Z96659 Presence of unspecified artificial knee joint: Secondary | ICD-10-CM | POA: Diagnosis present

## 2016-04-03 DIAGNOSIS — Z853 Personal history of malignant neoplasm of breast: Secondary | ICD-10-CM | POA: Diagnosis not present

## 2016-04-03 DIAGNOSIS — Z87891 Personal history of nicotine dependence: Secondary | ICD-10-CM | POA: Diagnosis not present

## 2016-04-03 DIAGNOSIS — Z8673 Personal history of transient ischemic attack (TIA), and cerebral infarction without residual deficits: Secondary | ICD-10-CM | POA: Diagnosis not present

## 2016-04-03 DIAGNOSIS — E1122 Type 2 diabetes mellitus with diabetic chronic kidney disease: Secondary | ICD-10-CM | POA: Diagnosis present

## 2016-04-03 DIAGNOSIS — J189 Pneumonia, unspecified organism: Secondary | ICD-10-CM | POA: Diagnosis present

## 2016-04-03 DIAGNOSIS — Z803 Family history of malignant neoplasm of breast: Secondary | ICD-10-CM | POA: Diagnosis not present

## 2016-04-03 DIAGNOSIS — Z794 Long term (current) use of insulin: Secondary | ICD-10-CM | POA: Diagnosis not present

## 2016-04-03 LAB — COMPREHENSIVE METABOLIC PANEL
ALBUMIN: 3 g/dL — AB (ref 3.5–5.0)
ALT: 8 U/L — ABNORMAL LOW (ref 14–54)
ANION GAP: 8 (ref 5–15)
AST: 14 U/L — ABNORMAL LOW (ref 15–41)
Alkaline Phosphatase: 52 U/L (ref 38–126)
BUN: 26 mg/dL — ABNORMAL HIGH (ref 6–20)
CO2: 24 mmol/L (ref 22–32)
Calcium: 8.4 mg/dL — ABNORMAL LOW (ref 8.9–10.3)
Chloride: 99 mmol/L — ABNORMAL LOW (ref 101–111)
Creatinine, Ser: 1.41 mg/dL — ABNORMAL HIGH (ref 0.44–1.00)
GFR calc Af Amer: 39 mL/min — ABNORMAL LOW (ref >60)
GFR calc non Af Amer: 34 mL/min — ABNORMAL LOW (ref >60)
GLUCOSE: 175 mg/dL — AB (ref 65–99)
POTASSIUM: 4.2 mmol/L (ref 3.5–5.1)
SODIUM: 131 mmol/L — AB (ref 135–145)
TOTAL PROTEIN: 6.5 g/dL (ref 6.5–8.1)
Total Bilirubin: 1.1 mg/dL (ref 0.3–1.2)

## 2016-04-03 LAB — BLOOD CULTURE ID PANEL (REFLEXED)
ACINETOBACTER BAUMANNII: NOT DETECTED
CANDIDA ALBICANS: NOT DETECTED
CANDIDA KRUSEI: NOT DETECTED
CANDIDA TROPICALIS: NOT DETECTED
Candida glabrata: NOT DETECTED
Candida parapsilosis: NOT DETECTED
ENTEROCOCCUS SPECIES: NOT DETECTED
Enterobacter cloacae complex: NOT DETECTED
Enterobacteriaceae species: NOT DETECTED
Escherichia coli: NOT DETECTED
Haemophilus influenzae: NOT DETECTED
KLEBSIELLA OXYTOCA: NOT DETECTED
Klebsiella pneumoniae: NOT DETECTED
LISTERIA MONOCYTOGENES: NOT DETECTED
Methicillin resistance: NOT DETECTED
NEISSERIA MENINGITIDIS: NOT DETECTED
PROTEUS SPECIES: NOT DETECTED
Pseudomonas aeruginosa: NOT DETECTED
STREPTOCOCCUS AGALACTIAE: NOT DETECTED
STREPTOCOCCUS PYOGENES: NOT DETECTED
STREPTOCOCCUS SPECIES: NOT DETECTED
Serratia marcescens: NOT DETECTED
Staphylococcus aureus (BCID): NOT DETECTED
Staphylococcus species: DETECTED — AB
Streptococcus pneumoniae: NOT DETECTED

## 2016-04-03 LAB — EXPECTORATED SPUTUM ASSESSMENT W REFEX TO RESP CULTURE

## 2016-04-03 LAB — GLUCOSE, CAPILLARY
GLUCOSE-CAPILLARY: 159 mg/dL — AB (ref 65–99)
Glucose-Capillary: 160 mg/dL — ABNORMAL HIGH (ref 65–99)
Glucose-Capillary: 136 mg/dL — ABNORMAL HIGH (ref 65–99)
Glucose-Capillary: 152 mg/dL — ABNORMAL HIGH (ref 65–99)

## 2016-04-03 LAB — URINE CULTURE

## 2016-04-03 LAB — STREP PNEUMONIAE URINARY ANTIGEN: STREP PNEUMO URINARY ANTIGEN: NEGATIVE

## 2016-04-03 LAB — EXPECTORATED SPUTUM ASSESSMENT W GRAM STAIN, RFLX TO RESP C

## 2016-04-03 MED ORDER — PREMIER PROTEIN SHAKE
11.0000 [oz_av] | ORAL | Status: DC
  Administered 2016-04-03: 11 [oz_av] via ORAL
  Filled 2016-04-03: qty 325.31

## 2016-04-03 MED ORDER — FUROSEMIDE 10 MG/ML IJ SOLN
20.0000 mg | Freq: Two times a day (BID) | INTRAMUSCULAR | Status: DC
  Administered 2016-04-03 – 2016-04-04 (×4): 20 mg via INTRAVENOUS
  Filled 2016-04-03 (×4): qty 2

## 2016-04-03 MED ORDER — ADULT MULTIVITAMIN W/MINERALS CH
1.0000 | ORAL_TABLET | Freq: Every day | ORAL | Status: DC
  Administered 2016-04-03 – 2016-04-05 (×3): 1 via ORAL
  Filled 2016-04-03 (×3): qty 1

## 2016-04-03 MED ORDER — GLUCERNA SHAKE PO LIQD
237.0000 mL | Freq: Two times a day (BID) | ORAL | Status: DC
  Administered 2016-04-03 – 2016-04-05 (×2): 237 mL via ORAL
  Filled 2016-04-03 (×6): qty 237

## 2016-04-03 NOTE — Progress Notes (Signed)
[facility]  PROGRESS NOTE  [name] [name] GYJ:[id number] DOB: Dec 10, 1932 DOA: 04/02/2016 PCP: [name], MD   Brief HPI:   82 year old woman presented with several day history of fatigue, poor oral intake, vomiting, intermittent shortness of breath and cough. Imaging revealed left upper lobe pneumonia, lactic acid was modestly elevated.  Patient reports illness began approximately 3 days ago with fatigue, intermittent shortness of breath, chills without definite fever, cough. December 25 very tired and did very little. 12/26 felt poorly had intermittent vomiting and other symptoms as above. Vomited today, fatigue, generalized weakness worsening. Took Mucinex which did not help. No other specific aggravating or alleviating factors noted.    Subjective   Patient seen and examined, breathing is improved. Patient says that she was taking Lasix which was recently discontinued by cardiologist. Echocardiogram from 2015 showed pulmonary hypertension with PA pressure 60 mmHg.   Assessment/Plan:     1. Lobar pneumonia- patient started on ceftriaxone and Zithromax, improving. Lactic acid 2.0, she is afebrile. Blood cultures 1 out of 2 bottles growing gram-positive cocci in clusters. We'll start vancomycin per pharmacy consultation. 2. ? Diastolic CHF- patient has significantly elevated BNP 1332, she was taking Lasix at home which was discontinued by her cardiologist recently. We'll start Lasix 20 mg IV every 12 hours. Echocardiogram has been ordered. 3. Diabetes mellitus- continue sliding scale insulin with NovoLog, blood glucose is well controlled. 4. Atrial fibrillation- heart rate is controlled, continue apixaban for anti-coagulation, Bystolic 10 mg daily for rate control 5. Hypothyroidism- continue Synthroid 6. Hypertension- continue Bystolic, amlodipine    DVT prophylaxis: Apixaban  Code Status: Full code  Family Communication: No family at bedside  Disposition Plan: Home  when medically stable   Consultants:  None  Procedures:  None  Continuous infusions     Antibiotics:   Anti-infectives    Start     Dose/Rate Route Frequency Ordered Stop   04/03/16 1000  cefTRIAXone (ROCEPHIN) 1 g in dextrose 5 % 50 mL IVPB     1 g 100 mL/hr over 30 Minutes Intravenous Every 24 hours 04/02/16 1818 04/10/16 0959   04/03/16 1000  azithromycin (ZITHROMAX) tablet 500 mg     500 mg Oral Every 24 hours 04/02/16 1818 04/10/16 0959   04/02/16 1030  cefTRIAXone (ROCEPHIN) 1 g in dextrose 5 % 50 mL IVPB     1 g 100 mL/hr over 30 Minutes Intravenous  Once 04/02/16 1024 04/02/16 1737   04/02/16 1030  azithromycin (ZITHROMAX) 500 mg in dextrose 5 % 250 mL IVPB     500 mg 250 mL/hr over 60 Minutes Intravenous  Once 04/02/16 1024 04/02/16 1648       Objective   Vitals:   04/02/16 1552 04/02/16 1803 04/02/16 2014 04/03/16 0500  BP: 144/67 (!) 151/91 140/70 118/78  Pulse: 83 83 74 78  Resp: _0 (!) 22  Temp:  97.5 F (36.4 C) 97.7 F (36.5 C) 97.8 F (36.6 C)  TempSrc:  Oral Oral Oral  SpO2: 100% 100% 100% 100%  Weight:      Height:        Intake/Output Summary (Last 24 hours) at 04/03/16 1406 Last data filed at 04/03/16 1000  Gross per 24 hour  Intake             1043 ml  Output                0 ml  Net  1043 ml   Filed Weights   04/02/16 1028  Weight: 73.5 kg (162 lb)     Physical Examination:  General exam: Appears calm and comfortable. Respiratory system: Bibasilar crackles Cardiovascular system:  RRR. No  murmurs, rubs, gallops. No pedal edema. GI system: Abdomen is nondistended, soft and nontender. No organomegaly.  Central nervous system. No focal neurological deficits. 5 x 5 power in all extremities. Skin: No rashes, lesions or ulcers. Psychiatry: Alert, oriented x 3.Judgement and insight appear normal. Affect normal.    Data Reviewed: I have personally reviewed following labs and imaging studies  CBG:  Recent  Labs Lab 04/02/16 2158 04/02/16 2312 04/03/16 0737 04/03/16 1133  GLUCAP 424* 363* 160* 159*    CBC:  Recent Labs Lab 04/02/16 1022  WBC 11.7*  NEUTROABS 9.7*  HGB 8.3*  HCT 24.7*  MCV 96.1  PLT 580    Basic Metabolic Panel:  Recent Labs Lab 04/02/16 1022 04/03/16 0519  NA 132* 131*  K 4.1 4.2  CL 100* 99*  CO2 25 24  GLUCOSE 150* 175*  BUN 21* 26*  CREATININE 1.47* 1.41*  CALCIUM 8.3* 8.4*    Recent Results (from the past 240 hour(s))  Blood Culture (routine x 2)     Status: None (Preliminary result)   Collection Time: 04/02/16 10:23 AM  Result Value Ref Range Status   Specimen Description BLOOD BLOOD RIGHT FOREARM  Final   Special Requests BOTTLES DRAWN AEROBIC AND ANAEROBIC 5 CC EA  Final   Culture   Final    NO GROWTH < 24 HOURS Performed at [facility]    Report Status PENDING  Incomplete  Blood Culture (routine x 2)     Status: None (Preliminary result)   Collection Time: 04/02/16 10:23 AM  Result Value Ref Range Status   Specimen Description BLOOD RIGHT ANTECUBITAL  Final   Special Requests BOTTLES DRAWN AEROBIC ONLY 5 CC  Final   Culture  Setup Time   Final    GRAM POSITIVE COCCI IN CLUSTERS AEROBIC BOTTLE ONLY Organism ID to follow CRITICAL RESULT CALLED TO, READ BACK BY AND VERIFIED WITH: E JACKSON 04/03/16 @ 32 M VESTAL Performed at [place]  Final   Report Status PENDING  Incomplete  Blood Culture ID Panel (Reflexed)     Status: Abnormal   Collection Time: 04/02/16 10:23 AM  Result Value Ref Range Status   Enterococcus species NOT DETECTED NOT DETECTED Final   Listeria monocytogenes NOT DETECTED NOT DETECTED Final   Staphylococcus species DETECTED (A) NOT DETECTED Final    Comment: CRITICAL RESULT CALLED TO, READ BACK BY AND VERIFIED WITH: E JACKSON 04/03/16 @ 17 M VESTAL    Staphylococcus aureus NOT DETECTED NOT DETECTED Final   Methicillin resistance NOT DETECTED NOT DETECTED  Final   Streptococcus species NOT DETECTED NOT DETECTED Final   Streptococcus agalactiae NOT DETECTED NOT DETECTED Final   Streptococcus pneumoniae NOT DETECTED NOT DETECTED Final   Streptococcus pyogenes NOT DETECTED NOT DETECTED Final   Acinetobacter baumannii NOT DETECTED NOT DETECTED Final   Enterobacteriaceae species NOT DETECTED NOT DETECTED Final   Enterobacter cloacae complex NOT DETECTED NOT DETECTED Final   Escherichia coli NOT DETECTED NOT DETECTED Final   Klebsiella oxytoca NOT DETECTED NOT DETECTED Final   Klebsiella pneumoniae NOT DETECTED NOT DETECTED Final   Proteus species NOT DETECTED NOT DETECTED Final   Serratia marcescens NOT DETECTED NOT DETECTED Final   Haemophilus influenzae  NOT DETECTED NOT DETECTED Final   Neisseria meningitidis NOT DETECTED NOT DETECTED Final   Pseudomonas aeruginosa NOT DETECTED NOT DETECTED Final   Candida albicans NOT DETECTED NOT DETECTED Final   Candida glabrata NOT DETECTED NOT DETECTED Final   Candida krusei NOT DETECTED NOT DETECTED Final   Candida parapsilosis NOT DETECTED NOT DETECTED Final   Candida tropicalis NOT DETECTED NOT DETECTED Final    Comment: Performed at [facility]  Urine culture     Status: Abnormal   Collection Time: 04/02/16 11:50 AM  Result Value Ref Range Status   Specimen Description URINE, CLEAN CATCH  Final   Special Requests NONE  Final   Culture MULTIPLE SPECIES PRESENT, SUGGEST RECOLLECTION (A)  Final   Report Status 04/03/2016 FINAL  Final  Culture, sputum-assessment     Status: None   Collection Time: 04/03/16  1:13 AM  Result Value Ref Range Status   Specimen Description SPUTUM  Final   Special Requests NONE  Final   Sputum evaluation THIS SPECIMEN IS ACCEPTABLE FOR SPUTUM CULTURE  Final   Report Status 04/03/2016 FINAL  Final  Culture, respiratory (NON-Expectorated)     Status: None (Preliminary result)   Collection Time: 04/03/16  1:13 AM  Result Value Ref Range Status   Specimen  Description SPUTUM  Final   Special Requests NONE Reflexed from [id number]  Final   Gram Stain   Final    FEW WBC PRESENT,BOTH PMN AND MONONUCLEAR MODERATE GRAM POSITIVE COCCI FEW GRAM NEGATIVE RODS Performed at [facility]    Culture PENDING  Incomplete   Report Status PENDING  Incomplete     Liver Function Tests:  Recent Labs Lab 04/02/16 1022 04/03/16 0519  AST 19 14*  ALT 9* 8*  ALKPHOS 54 52  BILITOT 1.7* 1.1  PROT 6.5 6.5  ALBUMIN 3.2* 3.0*    Recent Labs Lab 04/02/16 1022  LIPASE 26   No results for input(s): AMMONIA in the last 168 hours.  Cardiac Enzymes: No results for input(s): CKTOTAL, CKMB, CKMBINDEX, TROPONINI in the last 168 hours. BNP (last 3 results)  Recent Labs  04/02/16 1848  BNP 1,332.1*    ProBNP (last 3 results) No results for input(s): PROBNP in the last 8760 hours.    Studies: Dg Chest Port 1 View  Result Date: 04/02/2016 CLINICAL DATA:  82 year old female with history of nausea, vomiting, cough and fever worsening over the past 3 days. Unable to keep food or liquids down. EXAM: PORTABLE CHEST 1 VIEW COMPARISON:  Chest x-ray 11/24/2015. FINDINGS: New area of airspace consolidation in the periphery of the left lung, likely in the left upper lobe. Diffuse peribronchial cuffing. Cephalization of the pulmonary vasculature with indistinct interstitial markings, suggesting a background of mild interstitial pulmonary edema. No definite pleural effusions. Mild cardiomegaly. The patient is rotated to the right on today's exam, resulting in distortion of the mediastinal contours and reduced diagnostic sensitivity and specificity for mediastinal pathology. Atherosclerosis in the thoracic aorta. IMPRESSION: 1. New airspace consolidation in the periphery of the left lung, likely in the left upper lobe, concerning for pneumonia. 2. There are is also evidence suggestive of a background of mild congestive heart failure, as above. 3. Aortic  atherosclerosis. Electronically Signed   By: Vinnie Langton M.D.   On: 04/02/2016 10:18    Scheduled Meds: . amLODipine  5 mg Oral Daily  . apixaban  2.5 mg Oral BID  . azithromycin  500 mg Oral Q24H  . cefTRIAXone (ROCEPHIN)  IV  1 g Intravenous Q24H  . escitalopram  10 mg Oral Daily  . feeding supplement (GLUCERNA SHAKE)  237 mL Oral BID BM  . furosemide  20 mg Intravenous Q12H  . gabapentin  100 mg Oral BID  . insulin aspart  0-5 Units Subcutaneous QHS  . insulin aspart  0-9 Units Subcutaneous TID WC  . levothyroxine  100 mcg Oral QAC breakfast  . multivitamin with minerals  1 tablet Oral Daily  . nebivolol  10 mg Oral Daily  . predniSONE  5 mg Oral QODAY  . protein supplement shake  11 oz Oral Q24H  . sodium chloride flush  3 mL Intravenous Q12H      Time spent: 25 min  [facility] Pager [phone]. If 7PM-7AM, please contact night-coverage at [email or web address], Office  [phone]  password [email or web address] 04/03/2016, 2:06 PM  LOS: 0 days

## 2016-04-03 NOTE — Care Management Obs Status (Signed)
[facility] NOTIFICATION   Patient Details  Name: LITTIE CHIEM MRN: [id number] Date of Birth: October 08, 1932   Medicare Observation Status Notification Given:  Yes    CrutchfieldAntony Haste, RN 04/03/2016, 3:34 PM

## 2016-04-03 NOTE — Progress Notes (Signed)
PHARMACY - PHYSICIAN COMMUNICATION CRITICAL VALUE ALERT - BLOOD CULTURE IDENTIFICATION (BCID)  Results for orders placed or performed during the hospital encounter of 04/02/16  Blood Culture ID Panel (Reflexed) (Collected: 04/02/2016 10:23 AM)  Result Value Ref Range   Enterococcus species NOT DETECTED NOT DETECTED   Listeria monocytogenes NOT DETECTED NOT DETECTED   Staphylococcus species DETECTED (A) NOT DETECTED   Staphylococcus aureus NOT DETECTED NOT DETECTED   Methicillin resistance NOT DETECTED NOT DETECTED   Streptococcus species NOT DETECTED NOT DETECTED   Streptococcus agalactiae NOT DETECTED NOT DETECTED   Streptococcus pneumoniae NOT DETECTED NOT DETECTED   Streptococcus pyogenes NOT DETECTED NOT DETECTED   Acinetobacter baumannii NOT DETECTED NOT DETECTED   Enterobacteriaceae species NOT DETECTED NOT DETECTED   Enterobacter cloacae complex NOT DETECTED NOT DETECTED   Escherichia coli NOT DETECTED NOT DETECTED   Klebsiella oxytoca NOT DETECTED NOT DETECTED   Klebsiella pneumoniae NOT DETECTED NOT DETECTED   Proteus species NOT DETECTED NOT DETECTED   Serratia marcescens NOT DETECTED NOT DETECTED   Haemophilus influenzae NOT DETECTED NOT DETECTED   Neisseria meningitidis NOT DETECTED NOT DETECTED   Pseudomonas aeruginosa NOT DETECTED NOT DETECTED   Candida albicans NOT DETECTED NOT DETECTED   Candida glabrata NOT DETECTED NOT DETECTED   Candida krusei NOT DETECTED NOT DETECTED   Candida parapsilosis NOT DETECTED NOT DETECTED   Candida tropicalis NOT DETECTED NOT DETECTED    Name of physician (or Provider) Contacted: Lama  Changes to prescribed antibiotics required: Likely contaminant with 1 of 2 cultures with staph species. No changes Abx's  Kara Mead 04/03/2016  9:33 AM

## 2016-04-03 NOTE — Care Management Note (Signed)
Case Management Note  Patient Details  Name: [name] [name] MRN: [id number] Date of Birth: [birth year]  Subjective/Objective:  82 y.o. F who lives with and cares for spouse who has Dementia. Three adult sons are available to assist if pt can 1) ask for the help she needs and 2) allow her children to help her. No CM needs at this time.                   Action/Plan:  Anticipate discharge home today. No further CM needs but will be available should additional discharge needs arise.   Expected Discharge Date:   (unknown)               Expected Discharge Plan:  Home/Self Care  In-House Referral:  NA  Discharge planning Services  CM Consult  Post Acute Care Choice:  NA Choice offered to:  Patient  DME Arranged:  N/A DME Agency:  NA  HH Arranged:  NA HH Agency:  NA  Status of Service:  Completed, signed off  If discussed at [place] of Stay Meetings, dates discussed:    Additional Comments:  Delrae Sawyers, RN 04/03/2016, 3:35 PM

## 2016-04-03 NOTE — Progress Notes (Signed)
Initial Nutrition Assessment  DOCUMENTATION CODES:   Non-severe (moderate) malnutrition in context of chronic illness  INTERVENTION:  Provide Glucerna Shake po BID, each supplement provides 220 kcal and 10 grams of protein.  Provide Premier Protein po once daily, each supplement provides 100 kcal and 30 grams of protein.  Provide multivitamin with minerals daily.  Encouraged adequate intake of protein and calories at home. Discussed protein options patient can eat at home (milk, legumes, nuts/nut butters).  NUTRITION DIAGNOSIS:   Malnutrition (Moderate) related to chronic illness as evidenced by 20 percent weight loss over 8 months, moderate depletion of body fat, moderate depletions of muscle mass.  GOAL:   Patient will meet greater than or equal to 90% of their needs  MONITOR:   PO intake, Supplement acceptance, Labs, Weight trends, I & O's  REASON FOR ASSESSMENT:   Malnutrition Screening Tool    ASSESSMENT:   82 year old woman presented with several day history of fatigue, poor oral intake, vomiting, intermittent shortness of breath and cough. Found to have lobar PNA, acute hypoxic respiratory failure, possible CHF.   Spoke with patient at bedside. She reports her appetite is very good this morning for the first time in a few days and she ate 100% of breakfast. Prior to today she had gone 3 days without tolerating any food in setting of N/V. Denies abdominal pain, constipation/diarrhea, or difficulty chewing/swallowing. Patient typically has three meals daily. She has a light breakfast, soup for lunch, and baked meat or casserole for dinner. She also snacks on popsicles and crackers with cheese throughout the day.  Patient reports UBW around 200 lbs earlier this year. Per chart patient has lost 40 lbs (20% body weight) over 8 months, which is significant for time frame. She reports she may have unintentionally been eating less than usual as she was taking care of her husband  who has Alzheimer's disease.   Medications reviewed and include: azithromycin, ceftriaxone, Lasix 20 mg Q12hrs, Novolog sliding scale TID with meals and daily at bedtime, levothyroxine, prednisone 5 mg every other day.  Labs reviewed: CBG 160-424 past 24 hrs, Sodium 131, Chloride 99, BUN 26, Creatinine 1.41, HgbA1c 6.2 on 11/30/2015.  Nutrition-Focused physical exam completed. Findings are mild-moderate fat depletion, mild-moderate muscle depletion, and no edema.   Diet Order:  Diet Carb Modified Fluid consistency: Thin; Room service appropriate? Yes  Skin:  Reviewed, no issues  Last BM:  04/02/2016  Height:   Ht Readings from Last 1 Encounters:  04/02/16 _0  (1.676 m)    Weight:   Wt Readings from Last 1 Encounters:  04/02/16 162 lb (73.5 kg)    Ideal Body Weight:  59.1 kg  BMI:  Body mass index is 26.15 kg/m.  Estimated Nutritional Needs:   Kcal:  1470-1710 (MSJ x 1.2-1.4)  Protein:  75-90 grams (1-1.2 grams/kg)  Fluid:  Per MD in setting of possible CHF  EDUCATION NEEDS:   No education needs identified at this time  Willey Blade, MS, RD, LDN Pager: [phone] After Hours Pager: [phone]

## 2016-04-04 ENCOUNTER — Inpatient Hospital Stay (HOSPITAL_COMMUNITY): Payer: Medicare Other

## 2016-04-04 DIAGNOSIS — J989 Respiratory disorder, unspecified: Secondary | ICD-10-CM

## 2016-04-04 LAB — ECHOCARDIOGRAM COMPLETE
CHL CUP DOP CALC LVOT VTI: 23.2 cm
E decel time: 218 msec
EERAT: 13.17
FS: 30 % (ref 28–44)
Height: 66 in
IVS/LV PW RATIO, ED: 1.1
LA ID, A-P, ES: 50 mm
LA diam end sys: 50 mm
LA diam index: 2.73 cm/m2
LAVOL: 110 mL
LAVOLA4C: 98.6 mL
LAVOLIN: 60.1 mL/m2
LV E/e'average: 13.17
LV TDI E'LATERAL: 10.1
LV TDI E'MEDIAL: 8.38
LVEEMED: 13.17
LVELAT: 10.1 cm/s
LVOT area: 3.14 cm2
LVOT diameter: 20 mm
LVOTPV: 102 cm/s
LVOTSV: 73 mL
MV Dec: 218
MV pk E vel: 133 m/s
MVPG: 7 mmHg
PW: 12.4 mm — AB (ref 0.6–1.1)
Reg peak vel: 369 cm/s
TR max vel: 369 cm/s
Weight: 2592 oz

## 2016-04-04 LAB — BASIC METABOLIC PANEL
ANION GAP: 10 (ref 5–15)
BUN: 38 mg/dL — AB (ref 6–20)
CHLORIDE: 97 mmol/L — AB (ref 101–111)
CO2: 28 mmol/L (ref 22–32)
Calcium: 8.7 mg/dL — ABNORMAL LOW (ref 8.9–10.3)
Creatinine, Ser: 1.54 mg/dL — ABNORMAL HIGH (ref 0.44–1.00)
GFR calc Af Amer: 35 mL/min — ABNORMAL LOW (ref >60)
GFR calc non Af Amer: 30 mL/min — ABNORMAL LOW (ref >60)
GLUCOSE: 83 mg/dL (ref 65–99)
POTASSIUM: 3.5 mmol/L (ref 3.5–5.1)
Sodium: 135 mmol/L (ref 135–145)

## 2016-04-04 LAB — GLUCOSE, CAPILLARY
GLUCOSE-CAPILLARY: 147 mg/dL — AB (ref 65–99)
Glucose-Capillary: 67 mg/dL (ref 65–99)
Glucose-Capillary: 94 mg/dL (ref 65–99)
Glucose-Capillary: 200 mg/dL — ABNORMAL HIGH (ref 65–99)

## 2016-04-04 MED ORDER — FUROSEMIDE 20 MG PO TABS: 20.0000 mg | ORAL_TABLET | Freq: Every day | ORAL | 2 refills | Status: DC

## 2016-04-04 MED ORDER — LEVOFLOXACIN 500 MG PO TABS
500.0000 mg | ORAL_TABLET | ORAL | Status: AC
  Administered 2016-04-04: 500 mg via ORAL
  Filled 2016-04-04: qty 1

## 2016-04-04 MED ORDER — LEVOFLOXACIN 500 MG PO TABS: 500.0000 mg | ORAL_TABLET | Freq: Every day | ORAL | 0 refills | Status: DC

## 2016-04-04 MED ORDER — FUROSEMIDE 20 MG PO TABS: 20.0000 mg | ORAL_TABLET | Freq: Two times a day (BID) | ORAL | Status: DC

## 2016-04-04 MED ORDER — LEVOFLOXACIN 250 MG PO TABS
250.0000 mg | ORAL_TABLET | Freq: Every day | ORAL | Status: DC
  Filled 2016-04-04: qty 1

## 2016-04-04 MED ORDER — POTASSIUM CHLORIDE CRYS ER 20 MEQ PO TBCR
40.0000 meq | EXTENDED_RELEASE_TABLET | Freq: Once | ORAL | Status: AC
  Administered 2016-04-04: 40 meq via ORAL
  Filled 2016-04-04: qty 2

## 2016-04-04 MED ORDER — LEVOFLOXACIN 500 MG PO TABS: 500.0000 mg | ORAL_TABLET | Freq: Once | ORAL | Status: DC

## 2016-04-04 MED ORDER — FUROSEMIDE 40 MG PO TABS
40.0000 mg | ORAL_TABLET | Freq: Two times a day (BID) | ORAL | Status: DC
  Administered 2016-04-04 – 2016-04-05 (×2): 40 mg via ORAL
  Filled 2016-04-04 (×2): qty 1

## 2016-04-04 NOTE — Progress Notes (Signed)
  Echocardiogram 2D Echocardiogram has been performed.  [name] M 04/04/2016, 8:51 AM

## 2016-04-04 NOTE — Progress Notes (Signed)
PHARMACY NOTE:  ANTIMICROBIAL RENAL DOSAGE ADJUSTMENT  Current antimicrobial regimen includes a mismatch between antimicrobial dosage and estimated renal function.  As per policy approved by the Pharmacy & Therapeutics and Medical Executive Committees, the antimicrobial dosage will be adjusted accordingly.  Current antimicrobial dosage:  Levaquin 56m daily  Indication: CAP  Renal Function:  Estimated Creatinine Clearance: 29.7 mL/min (by C-G formula based on SCr of 1.54 mg/dL (H)). _0      On intermittent HD, scheduled: _1      On CRRT    Antimicrobial dosage has been changed to:  5079mx 1 then 25070maily thereafter  Additional comments:   Thank you for allowing pharmacy to be a part of this patient's care.  LegKara MeadPHMarietta Eye Surgery/29/2017 12:05 PM

## 2016-04-04 NOTE — Progress Notes (Signed)
[facility]  PROGRESS NOTE  [name] [name] YTK:[id number] DOB: [birth year] DOA: 04/02/2016 PCP: [name], MD   Brief HPI:   82 year old woman presented with several day history of fatigue, poor oral intake, vomiting, intermittent shortness of breath and cough. Imaging revealed left upper lobe pneumonia, lactic acid was modestly elevated.  Patient reports illness began approximately 3 days ago with fatigue, intermittent shortness of breath, chills without definite fever, cough. December 25 very tired and did very little. 12/26 felt poorly had intermittent vomiting and other symptoms as above. Vomited today, fatigue, generalized weakness worsening. Took Mucinex which did not help. No other specific aggravating or alleviating factors noted.    Subjective    Patient says that she was taking Lasix which was recently discontinued by cardiologist. Echocardiogram from 2015 showed pulmonary hypertension with PA pressure 60 mmHg.  This morning patient continues to improve. No dyspnea or chest pain.   Assessment/Plan:     1. Lobar pneumonia- patient started on ceftriaxone and Zithromax, improving. Lactic acid 2.0, she is afebrile. Blood cultures 1 out of 2 bottles growing gram-positive cocci in clusters. We'll start vancomycin per pharmacy consultation. Final blood culture report is pending.Will change antibiotics to by mouth Levaquin. 2. ? Diastolic CHF- patient has significantly elevated BNP 1332, she was taking Lasix at home which was discontinued by her cardiologist recently. We'll start Lasix 20 mg IV every 12 hours. Echocardiogram Showed EF 55-60%, indeterminate diastolic function due to underlying atrial fibrillation. Continue with Lasix 3. Diabetes mellitus- continue sliding scale insulin with NovoLog, blood glucose is well controlled. 4. Atrial fibrillation- heart rate is controlled, continue apixaban for anti-coagulation, Bystolic 10 mg daily for rate  control 5. Hypothyroidism- continue Synthroid 6. Hypertension- continue Bystolic, amlodipine    DVT prophylaxis: Apixaban  Code Status: Full code  Family Communication: No family at bedside  Disposition Plan: Home when medically stable   Consultants:  None  Procedures:  None  Continuous infusions     Antibiotics:   Anti-infectives    Start     Dose/Rate Route Frequency Ordered Stop   04/05/16 1800  levofloxacin (LEVAQUIN) tablet 250 mg     250 mg Oral Daily-1800 04/04/16 1205     04/04/16 1800  levofloxacin (LEVAQUIN) tablet 500 mg  Status:  Discontinued     500 mg Oral  Once 04/04/16 1202 04/04/16 1210   04/04/16 1215  levofloxacin (LEVAQUIN) tablet 500 mg     500 mg Oral NOW 04/04/16 1210 04/04/16 1408   04/04/16 0000  levofloxacin (LEVAQUIN) 500 MG tablet     500 mg Oral Daily 04/04/16 1326     04/03/16 1000  cefTRIAXone (ROCEPHIN) 1 g in dextrose 5 % 50 mL IVPB  Status:  Discontinued     1 g 100 mL/hr over 30 Minutes Intravenous Every 24 hours 04/02/16 1818 04/04/16 1202   04/03/16 1000  azithromycin (ZITHROMAX) tablet 500 mg  Status:  Discontinued     500 mg Oral Every 24 hours 04/02/16 1818 04/04/16 1202   04/02/16 1030  cefTRIAXone (ROCEPHIN) 1 g in dextrose 5 % 50 mL IVPB     1 g 100 mL/hr over 30 Minutes Intravenous  Once 04/02/16 1024 04/02/16 1737   04/02/16 1030  azithromycin (ZITHROMAX) 500 mg in dextrose 5 % 250 mL IVPB     500 mg 250 mL/hr over 60 Minutes Intravenous  Once 04/02/16 1024 04/02/16 1648       Objective   Vitals:   04/03/16 2038 04/04/16 0411  04/04/16 1000 04/04/16 1440  BP: 133/76 119/61  126/69  Pulse: 79 69  86  Resp: _0 Temp: 98.4 F (36.9 C) 97.7 F (36.5 C)  98.2 F (36.8 C)  TempSrc: Oral Oral  Oral  SpO2: 100% 99%  100%  Weight:   75 kg (165 lb 5.5 oz)   Height:        Intake/Output Summary (Last 24 hours) at 04/04/16 1626 Last data filed at 04/04/16 1319  Gross per 24 hour  Intake              483  ml  Output              900 ml  Net             -417 ml   Filed Weights   04/02/16 1028 04/04/16 1000  Weight: 73.5 kg (162 lb) 75 kg (165 lb 5.5 oz)     Physical Examination:  General exam: Appears calm and comfortable. Respiratory system: Bibasilar crackles Cardiovascular system:  RRR. No  murmurs, rubs, gallops. No pedal edema. GI system: Abdomen is nondistended, soft and nontender. No organomegaly.  Central nervous system. No focal neurological deficits. 5 x 5 power in all extremities. Skin: No rashes, lesions or ulcers. Psychiatry: Alert, oriented x 3.Judgement and insight appear normal. Affect normal.    Data Reviewed: I have personally reviewed following labs and imaging studies  CBG:  Recent Labs Lab 04/03/16 1133 04/03/16 1641 04/03/16 2037 04/04/16 0730 04/04/16 1146  GLUCAP 159* 136* 152* 67 94    CBC:  Recent Labs Lab 04/02/16 1022  WBC 11.7*  NEUTROABS 9.7*  HGB 8.3*  HCT 24.7*  MCV 96.1  PLT 638    Basic Metabolic Panel:  Recent Labs Lab 04/02/16 1022 04/03/16 0519 04/04/16 0534  NA 132* 131* 135  K 4.1 4.2 3.5  CL 100* 99* 97*  CO2 _1 GLUCOSE 150* 175* 83  BUN 21* 26* 38*  CREATININE 1.47* 1.41* 1.54*  CALCIUM 8.3* 8.4* 8.7*    Recent Results (from the past 240 hour(s))  Blood Culture (routine x 2)     Status: None (Preliminary result)   Collection Time: 04/02/16 10:23 AM  Result Value Ref Range Status   Specimen Description BLOOD BLOOD RIGHT FOREARM  Final   Special Requests BOTTLES DRAWN AEROBIC AND ANAEROBIC 5 CC EA  Final   Culture   Final    NO GROWTH 2 DAYS Performed at [facility]    Report Status PENDING  Incomplete  Blood Culture (routine x 2)     Status: Abnormal (Preliminary result)   Collection Time: 04/02/16 10:23 AM  Result Value Ref Range Status   Specimen Description BLOOD RIGHT ANTECUBITAL  Final   Special Requests BOTTLES DRAWN AEROBIC ONLY 5 CC  Final   Culture  Setup Time   Final     GRAM POSITIVE COCCI IN CLUSTERS AEROBIC BOTTLE ONLY CRITICAL RESULT CALLED TO, READ BACK BY AND VERIFIED WITH: E JACKSON 04/03/16 @ 69 M VESTAL Performed at [place] (COAGULASE NEGATIVE) (A)  Final   Report Status PENDING  Incomplete  Blood Culture ID Panel (Reflexed)     Status: Abnormal   Collection Time: 04/02/16 10:23 AM  Result Value Ref Range Status   Enterococcus species NOT DETECTED NOT DETECTED Final   Listeria monocytogenes NOT DETECTED NOT DETECTED Final   Staphylococcus species DETECTED (A) NOT DETECTED  Final    Comment: CRITICAL RESULT CALLED TO, READ BACK BY AND VERIFIED WITH: E JACKSON 04/03/16 @ 31 M VESTAL    Staphylococcus aureus NOT DETECTED NOT DETECTED Final   Methicillin resistance NOT DETECTED NOT DETECTED Final   Streptococcus species NOT DETECTED NOT DETECTED Final   Streptococcus agalactiae NOT DETECTED NOT DETECTED Final   Streptococcus pneumoniae NOT DETECTED NOT DETECTED Final   Streptococcus pyogenes NOT DETECTED NOT DETECTED Final   Acinetobacter baumannii NOT DETECTED NOT DETECTED Final   Enterobacteriaceae species NOT DETECTED NOT DETECTED Final   Enterobacter cloacae complex NOT DETECTED NOT DETECTED Final   Escherichia coli NOT DETECTED NOT DETECTED Final   Klebsiella oxytoca NOT DETECTED NOT DETECTED Final   Klebsiella pneumoniae NOT DETECTED NOT DETECTED Final   Proteus species NOT DETECTED NOT DETECTED Final   Serratia marcescens NOT DETECTED NOT DETECTED Final   Haemophilus influenzae NOT DETECTED NOT DETECTED Final   Neisseria meningitidis NOT DETECTED NOT DETECTED Final   Pseudomonas aeruginosa NOT DETECTED NOT DETECTED Final   Candida albicans NOT DETECTED NOT DETECTED Final   Candida glabrata NOT DETECTED NOT DETECTED Final   Candida krusei NOT DETECTED NOT DETECTED Final   Candida parapsilosis NOT DETECTED NOT DETECTED Final   Candida tropicalis NOT DETECTED NOT DETECTED Final     Comment: Performed at [facility]  Urine culture     Status: Abnormal   Collection Time: 04/02/16 11:50 AM  Result Value Ref Range Status   Specimen Description URINE, CLEAN CATCH  Final   Special Requests NONE  Final   Culture MULTIPLE SPECIES PRESENT, SUGGEST RECOLLECTION (A)  Final   Report Status 04/03/2016 FINAL  Final  Culture, sputum-assessment     Status: None   Collection Time: 04/03/16  1:13 AM  Result Value Ref Range Status   Specimen Description SPUTUM  Final   Special Requests NONE  Final   Sputum evaluation THIS SPECIMEN IS ACCEPTABLE FOR SPUTUM CULTURE  Final   Report Status 04/03/2016 FINAL  Final  Culture, respiratory (NON-Expectorated)     Status: None (Preliminary result)   Collection Time: 04/03/16  1:13 AM  Result Value Ref Range Status   Specimen Description SPUTUM  Final   Special Requests NONE Reflexed from [id number]  Final   Gram Stain   Final    FEW WBC PRESENT,BOTH PMN AND MONONUCLEAR MODERATE GRAM POSITIVE COCCI FEW GRAM NEGATIVE RODS    Culture   Final    CULTURE REINCUBATED FOR BETTER GROWTH Performed at [facility]    Report Status PENDING  Incomplete     Liver Function Tests:  Recent Labs Lab 04/02/16 1022 04/03/16 0519  AST 19 14*  ALT 9* 8*  ALKPHOS 54 52  BILITOT 1.7* 1.1  PROT 6.5 6.5  ALBUMIN 3.2* 3.0*    Recent Labs Lab 04/02/16 1022  LIPASE 26   No results for input(s): AMMONIA in the last 168 hours.  Cardiac Enzymes: No results for input(s): CKTOTAL, CKMB, CKMBINDEX, TROPONINI in the last 168 hours. BNP (last 3 results)  Recent Labs  04/02/16 1848  BNP 1,332.1*    ProBNP (last 3 results) No results for input(s): PROBNP in the last 8760 hours.    Studies: No results found.  Scheduled Meds: . amLODipine  5 mg Oral Daily  . apixaban  2.5 mg Oral BID  . escitalopram  10 mg Oral Daily  . feeding supplement (GLUCERNA SHAKE)  237 mL Oral BID BM  . furosemide  20  mg Intravenous Q12H  . gabapentin   100 mg Oral BID  . insulin aspart  0-5 Units Subcutaneous QHS  . insulin aspart  0-9 Units Subcutaneous TID WC  . [START ON 04/05/2016] levofloxacin  250 mg Oral q1800  . levothyroxine  100 mcg Oral QAC breakfast  . multivitamin with minerals  1 tablet Oral Daily  . nebivolol  10 mg Oral Daily  . predniSONE  5 mg Oral QODAY  . protein supplement shake  11 oz Oral Q24H  . sodium chloride flush  3 mL Intravenous Q12H      Time spent: 25 min  [facility] Pager [phone]. If 7PM-7AM, please contact night-coverage at [email or web address], Office  [phone]  password [email or web address] 04/04/2016, 4:26 PM  LOS: 1 day

## 2016-04-04 NOTE — Evaluation (Signed)
Physical Therapy Evaluation Patient Details Name: [name] [name] MRN: [id number] DOB: 1932/07/13 Today's Date: 04/04/2016    SATURATION QUALIFICATIONS: (This note is used to comply with regulatory documentation for home oxygen)  Patient Saturations on Room Air at Rest = 96%  Patient Saturations on Room Air while Ambulating = 87%   History of Present Illness  82 yo female admitted with Pna. Hx of COPD, vertigo, DM, CVA, HTN, A fib, R foot cellulitis  Clinical Impression  On eval, pt was Min guard assist for ambulation. She walked ~100'x 2 with a RW. Pt tolerated activity well. Dyspnea 2/4 with activity. Recommend HHPT follow up, if pt is agreeable. Made RN aware of O2 sat levels. Pt reports she wears O2 at night at baseline.     Follow Up Recommendations Supervision - Intermittent;Home health PT    Equipment Recommendations  None recommended by PT    Recommendations for Other Services       Precautions / Restrictions Precautions Precautions: Fall Precaution Comments: monitor sats; hx of vertigo Restrictions Weight Bearing Restrictions: No      Mobility  Bed Mobility Overal bed mobility: Modified Independent                Transfers Overall transfer level: Modified independent                  Ambulation/Gait Ambulation/Gait assistance: Min guard Ambulation Distance (Feet): 100 Feet (x2) Assistive device: Rolling walker (2 wheeled) Gait Pattern/deviations: Step-through pattern;Decreased stride length;Decreased step length - right;Decreased step length - left     General Gait Details: close guard for safety. O2 sats 87% on RA, dyspnea 2/4.   Stairs            Wheelchair Mobility    Modified Rankin (Stroke Patients Only)       Balance                                             Pertinent Vitals/Pain Pain Assessment: No/denies pain    Home Living Family/patient expects to be discharged to:: Private residence Living  Arrangements: Spouse/significant other (spouse has dementia) Available Help at Discharge: Family Type of Home: House Home Access: Stairs to enter Entrance Stairs-Rails: Psychiatric nurse of Steps: "a few" Home Layout: One level Home Equipment: [place] - 2 wheels;Walker - 4 wheels;Wheelchair - manual      Prior Function Level of Independence: Independent with assistive device(s)         Comments: RW for ambulation     Hand Dominance        Extremity/Trunk Assessment   Upper Extremity Assessment Upper Extremity Assessment: Overall WFL for tasks assessed    Lower Extremity Assessment Lower Extremity Assessment: Generalized weakness    Cervical / Trunk Assessment Cervical / Trunk Assessment: Normal  Communication   Communication: No difficulties  Cognition Arousal/Alertness: Awake/alert Behavior During Therapy: WFL for tasks assessed/performed Overall Cognitive Status: Within Functional Limits for tasks assessed                      General Comments      Exercises     Assessment/Plan    PT Assessment Patient needs continued PT services  PT Problem List Decreased mobility;Decreased activity tolerance;Decreased balance;Decreased strength          PT Treatment Interventions DME instruction;Therapeutic exercise;Gait training;Therapeutic activities;Patient/family education;Functional  mobility training    PT Goals (Current goals can be found in the Care Plan section)  Acute Rehab PT Goals Patient Stated Goal: home soon PT Goal Formulation: With patient Time For Goal Achievement: 04/18/16 Potential to Achieve Goals: Good    Frequency Min 3X/week   Barriers to discharge        Co-evaluation               End of Session Equipment Utilized During Treatment: Gait belt Activity Tolerance: Patient tolerated treatment well Patient left: in chair;with call bell/phone within reach;with chair alarm set           Time:  5003-7048 PT Time Calculation (min) (ACUTE ONLY): 27 min   Charges:   PT Evaluation $PT Eval Low Complexity: 1 Procedure PT Treatments $Gait Training: 8-22 mins   PT G Codes:       Weston Anna, MPT Pager: [phone]

## 2016-04-04 NOTE — Progress Notes (Signed)
Discharge planning, spoke with patient and spouse at beside. Chose [facility] for [facility] services, contacted [facility] for referral. Has RW and 3-n-1 at home. Has O2 at home but only wears at night, notified [facility] that now O2 is continuous and patient will need a travel tank to get home. [phone]

## 2016-04-05 LAB — BASIC METABOLIC PANEL
ANION GAP: 8 (ref 5–15)
BUN: 36 mg/dL — AB (ref 6–20)
CHLORIDE: 93 mmol/L — AB (ref 101–111)
CO2: 31 mmol/L (ref 22–32)
Calcium: 8.6 mg/dL — ABNORMAL LOW (ref 8.9–10.3)
Creatinine, Ser: 1.67 mg/dL — ABNORMAL HIGH (ref 0.44–1.00)
GFR calc Af Amer: 32 mL/min — ABNORMAL LOW (ref >60)
GFR calc non Af Amer: 28 mL/min — ABNORMAL LOW (ref >60)
GLUCOSE: 101 mg/dL — AB (ref 65–99)
POTASSIUM: 3.7 mmol/L (ref 3.5–5.1)
Sodium: 132 mmol/L — ABNORMAL LOW (ref 135–145)

## 2016-04-05 LAB — CULTURE, RESPIRATORY W GRAM STAIN: Culture: NORMAL

## 2016-04-05 LAB — CULTURE, BLOOD (ROUTINE X 2)

## 2016-04-05 LAB — GLUCOSE, CAPILLARY
Glucose-Capillary: 83 mg/dL (ref 65–99)
Glucose-Capillary: 118 mg/dL — ABNORMAL HIGH (ref 65–99)

## 2016-04-05 NOTE — Care Management Note (Signed)
Case Management Note  Patient Details  Name: NEIDA ELLEGOOD MRN: [id number] Date of Birth: [birth year]  Subjective/Objective:      Lobar Pneumonia, acute resp failure              Action/Plan: Discharge Planning: AVS reviewed: Please see previous NCM notes.   Contacted [facility] to make of dc home today with oxygen and HH. Made rep aware pt will be oxygen continuously. Spoke to attending and orders modified to reflect continuous oxygen at home.    PCP [name] MD   Expected Discharge Date:  04/05/2016           Expected Discharge Plan:  [place]  In-House Referral:  NA  Discharge planning Services  CM Consult  Post Acute Care Choice:  Home Health Choice offered to:  Patient  DME Arranged:  Oxygen DME Agency:  [place]:  PT [facility] Agency:  [place]  Status of Service:  Completed, signed off  If discussed at [place] of Stay Meetings, dates discussed:    Additional Comments:  Erenest Rasher, RN 04/05/2016, 12:10 PM

## 2016-04-05 NOTE — Progress Notes (Signed)
Patient discharged home.  Leaving with personal belongings, prescriptions to be picked up at [place], and [place] for oxygen needs and physical therapy.  Reports understanding of discharge instructions.  2L [place], denies pain, no s/s of distress.  Accompanied by husband and son.  No complaints.

## 2016-04-05 NOTE — Discharge Summary (Signed)
Physician Discharge Summary  [name] [name]:[id number] DOB: May 17, 1932 DOA: 04/02/2016  PCP: Kristian Covey, MD  Admit date: 04/02/2016 Discharge date: 04/05/2016  Time spent: 25* minutes  Recommendations for Outpatient Follow-up:  1. Follow up PCP in 2 weeks   Discharge Diagnoses:  Principal Problem:   Lobar pneumonia (HCC) Active Problems:   Acute on chronic respiratory failure (HCC)   Chronic atrial fibrillation (HCC)   CAP (community acquired pneumonia)   Discharge Condition: Stable  Diet recommendation: Low salt diet  Filed Weights   04/02/16 1028 04/04/16 1000  Weight: 73.5 kg (162 lb) 75 kg (165 lb 5.5 oz)    History of present illness:  82 year old woman presented with several day history of fatigue, poor oral intake, vomiting, intermittent shortness of breath and cough. Imaging revealed left upper lobe pneumonia, lactic acid was modestly elevated.  Patient reports illness began approximately 3 days ago with fatigue, intermittent shortness of breath, chills without definite fever, cough. December 25 very tired and did very little. 12/26 felt poorly had intermittent vomiting and other symptoms as above. Vomited today, fatigue, generalized weakness worsening. Took Mucinex which did not help. No other specific aggravating or alleviating factors noted.  Hospital Course:  1. Lobar pneumonia- patient started on ceftriaxone and Zithromax, improving. Lactic acid 2.0, she is afebrile. Blood cultures 1 out of 2 bottles growing gram-positive cocci in clusters, which turned out to be contamination with Coagulase negative Staph. .Will continue with  antibiotics Levaquin 500 mg po daily for 5 days. 2. ? Diastolic CHF- patient has significantly elevated BNP 1332, she was taking Lasix at home which was discontinued by her cardiologist recently. We'll start Lasix 20 mg IV every 12 hours. Echocardiogram Showed EF 55-60%, indeterminate diastolic function due to underlying atrial  fibrillation. Continue with Lasix 20 mg po daily. 3. Pulmonary hypertension- patient has moderate pulmonary hypertension, with PA pressure 62 mm hg. Will discharge home on oxygen 2 l/min. 4. Diabetes mellitus- continue Glimepride, blood glucose is well controlled. 5. Atrial fibrillation- heart rate is controlled, continue apixaban for anti-coagulation, Bystolic 10 mg daily for rate control 6. Hypothyroidism- continue Synthroid 7. Hypertension- continue Bystolic, amlodipine  Procedures:  Echocardiogram  Consultations:  None   Discharge Exam: Vitals:   04/04/16 2116 04/05/16 0600  BP: 116/75 130/72  Pulse: 91 76  Resp: 20 20  Temp: 98.4 F (36.9 C) 97.7 F (36.5 C)    General: Appears in no acute distress Cardiovascular: RRR, S1S2 Respiratory: Clear bilaterally  Discharge Instructions    Current Discharge Medication List    START taking these medications   Details  furosemide (LASIX) 20 MG tablet Take 1 tablet (20 mg total) by mouth daily. Qty: 30 tablet, Refills: 2    levofloxacin (LEVAQUIN) 500 MG tablet Take 1 tablet (500 mg total) by mouth daily. Qty: 5 tablet, Refills: 0      CONTINUE these medications which have NOT CHANGED   Details  amLODipine (NORVASC) 5 MG tablet TAKE ONE TABLET BY MOUTH ONCE DAILY Qty: 30 tablet, Refills: 11    BYSTOLIC 10 MG tablet TAKE ONE TABLET BY MOUTH ONCE DAILY AFTER  BREAKFAST. Qty: 30 tablet, Refills: 11    CALCIUM CITRATE PO Take 1,200 mg by mouth daily with lunch.     cholecalciferol (VITAMIN D) 1000 UNITS tablet Take 3,000 Units by mouth daily with lunch.     diazepam (VALIUM) 5 MG tablet Take one tablet by mouth as needed for severe vertigo flares Qty: 20 tablet, Refills: 0  ELIQUIS 2.5 MG TABS tablet TAKE ONE TABLET BY MOUTH TWICE DAILY Qty: 180 tablet, Refills: 1    escitalopram (LEXAPRO) 10 MG tablet Take 1 tablet (10 mg total) by mouth daily. Qty: 30 tablet, Refills: 6    gabapentin (NEURONTIN) 100 MG  capsule TAKE ONE CAPSULE BY MOUTH TWICE DAILY BEFORE A  MEAL Qty: 60 capsule, Refills: 5    glimepiride (AMARYL) 2 MG tablet Take 1 tablet (2 mg total) by mouth daily with breakfast. TAKE ONE TABLET BY MOUTH ONCE DAILY BEFORE BREAKFAST Qty: 60 tablet, Refills: 0    guaiFENesin (MUCINEX) 600 MG 12 hr tablet Take 600 mg by mouth 2 (two) times daily as needed for cough or to loosen phlegm.    levothyroxine (SYNTHROID, LEVOTHROID) 100 MCG tablet Take 1 tablet (100 mcg total) by mouth daily before breakfast. Qty: 90 tablet, Refills: 2    loratadine (CLARITIN) 10 MG tablet Take 10 mg by mouth daily as needed for allergies.    predniSONE (DELTASONE) 5 MG tablet Take 1 tablet (5 mg total) by mouth daily with breakfast. Qty: 30 tablet, Refills: 5    triamcinolone cream (KENALOG) 0.1 % Apply 1 application topically 2 (two) times daily as needed. Qty: 453 g, Refills: 1      STOP taking these medications     doxycycline (VIBRA-TABS) 100 MG tablet        Allergies  Allergen Reactions  . Codeine Sulfate Other (See Comments)    REACTION: GI upset  . Sulfonamide Derivatives Other (See Comments)    REACTION: GI upset  . Vancomycin Other (See Comments)    Red man syndrome  . Atarax [Hydroxyzine] Nausea Only and Rash   Follow-up Information    [facility] Follow up.   Why:  Continous oxygen and travel tank Contact information: [address] [place] [address] [phone]        [facility] Follow up.   Why:  physical therapy Contact information: [address] [place] [address] [phone]            The results of significant diagnostics from this hospitalization (including imaging, microbiology, ancillary and laboratory) are listed below for reference.    Significant Diagnostic Studies: Dg Chest Port 1 View  Result Date: 04/02/2016 CLINICAL DATA:  82 year old female with history of nausea, vomiting, cough and  fever worsening over the past 3 days. Unable to keep food or liquids down. EXAM: PORTABLE CHEST 1 VIEW COMPARISON:  Chest x-ray 11/24/2015. FINDINGS: New area of airspace consolidation in the periphery of the left lung, likely in the left upper lobe. Diffuse peribronchial cuffing. Cephalization of the pulmonary vasculature with indistinct interstitial markings, suggesting a background of mild interstitial pulmonary edema. No definite pleural effusions. Mild cardiomegaly. The patient is rotated to the right on today's exam, resulting in distortion of the mediastinal contours and reduced diagnostic sensitivity and specificity for mediastinal pathology. Atherosclerosis in the thoracic aorta. IMPRESSION: 1. New airspace consolidation in the periphery of the left lung, likely in the left upper lobe, concerning for pneumonia. 2. There are is also evidence suggestive of a background of mild congestive heart failure, as above. 3. Aortic atherosclerosis. Electronically Signed   By: Trudie Reed M.D.   On: 04/02/2016 10:18    Microbiology: Recent Results (from the past 240 hour(s))  Blood Culture (routine x 2)     Status: None (Preliminary result)   Collection Time: 04/02/16 10:23 AM  Result Value  Ref Range Status   Specimen Description BLOOD BLOOD RIGHT FOREARM  Final   Special Requests BOTTLES DRAWN AEROBIC AND ANAEROBIC 5 CC EA  Final   Culture   Final    NO GROWTH 3 DAYS Performed at [facility]    Report Status PENDING  Incomplete  Blood Culture (routine x 2)     Status: Abnormal   Collection Time: 04/02/16 10:23 AM  Result Value Ref Range Status   Specimen Description BLOOD RIGHT ANTECUBITAL  Final   Special Requests BOTTLES DRAWN AEROBIC ONLY 5 CC  Final   Culture  Setup Time   Final    GRAM POSITIVE COCCI IN CLUSTERS AEROBIC BOTTLE ONLY CRITICAL RESULT CALLED TO, READ BACK BY AND VERIFIED WITH: E JACKSON 04/03/16 @ 0911 M VESTAL    Culture (A)  Final    STAPHYLOCOCCUS SPECIES  (COAGULASE NEGATIVE) THE SIGNIFICANCE OF ISOLATING THIS ORGANISM FROM A SINGLE SET OF BLOOD CULTURES WHEN MULTIPLE SETS ARE DRAWN IS UNCERTAIN. PLEASE NOTIFY THE MICROBIOLOGY DEPARTMENT WITHIN ONE WEEK IF SPECIATION AND SENSITIVITIES ARE REQUIRED. Performed at [facility]    Report Status 04/05/2016 FINAL  Final  Blood Culture ID Panel (Reflexed)     Status: Abnormal   Collection Time: 04/02/16 10:23 AM  Result Value Ref Range Status   Enterococcus species NOT DETECTED NOT DETECTED Final   Listeria monocytogenes NOT DETECTED NOT DETECTED Final   Staphylococcus species DETECTED (A) NOT DETECTED Final    Comment: CRITICAL RESULT CALLED TO, READ BACK BY AND VERIFIED WITH: E JACKSON 04/03/16 @ 0911 M VESTAL    Staphylococcus aureus NOT DETECTED NOT DETECTED Final   Methicillin resistance NOT DETECTED NOT DETECTED Final   Streptococcus species NOT DETECTED NOT DETECTED Final   Streptococcus agalactiae NOT DETECTED NOT DETECTED Final   Streptococcus pneumoniae NOT DETECTED NOT DETECTED Final   Streptococcus pyogenes NOT DETECTED NOT DETECTED Final   Acinetobacter baumannii NOT DETECTED NOT DETECTED Final   Enterobacteriaceae species NOT DETECTED NOT DETECTED Final   Enterobacter cloacae complex NOT DETECTED NOT DETECTED Final   Escherichia coli NOT DETECTED NOT DETECTED Final   Klebsiella oxytoca NOT DETECTED NOT DETECTED Final   Klebsiella pneumoniae NOT DETECTED NOT DETECTED Final   Proteus species NOT DETECTED NOT DETECTED Final   Serratia marcescens NOT DETECTED NOT DETECTED Final   Haemophilus influenzae NOT DETECTED NOT DETECTED Final   Neisseria meningitidis NOT DETECTED NOT DETECTED Final   Pseudomonas aeruginosa NOT DETECTED NOT DETECTED Final   Candida albicans NOT DETECTED NOT DETECTED Final   Candida glabrata NOT DETECTED NOT DETECTED Final   Candida krusei NOT DETECTED NOT DETECTED Final   Candida parapsilosis NOT DETECTED NOT DETECTED Final   Candida tropicalis NOT  DETECTED NOT DETECTED Final    Comment: Performed at [facility]  Urine culture     Status: Abnormal   Collection Time: 04/02/16 11:50 AM  Result Value Ref Range Status   Specimen Description URINE, CLEAN CATCH  Final   Special Requests NONE  Final   Culture MULTIPLE SPECIES PRESENT, SUGGEST RECOLLECTION (A)  Final   Report Status 04/03/2016 FINAL  Final  Culture, sputum-assessment     Status: None   Collection Time: 04/03/16  1:13 AM  Result Value Ref Range Status   Specimen Description SPUTUM  Final   Special Requests NONE  Final   Sputum evaluation THIS SPECIMEN IS ACCEPTABLE FOR SPUTUM CULTURE  Final   Report Status 04/03/2016 FINAL  Final  Culture, respiratory (  NON-Expectorated)     Status: None (Preliminary result)   Collection Time: 04/03/16  1:13 AM  Result Value Ref Range Status   Specimen Description SPUTUM  Final   Special Requests NONE Reflexed from [id number]  Final   Gram Stain   Final    FEW WBC PRESENT,BOTH PMN AND MONONUCLEAR MODERATE GRAM POSITIVE COCCI FEW GRAM NEGATIVE RODS    Culture   Final    CULTURE REINCUBATED FOR BETTER GROWTH Performed at [facility]    Report Status PENDING  Incomplete     Labs: Basic Metabolic Panel:  Recent Labs Lab 04/02/16 1022 04/03/16 0519 04/04/16 0534 04/05/16 0512  NA 132* 131* 135 132*  K 4.1 4.2 3.5 3.7  CL 100* 99* 97* 93*  CO2 25 24 28 31   GLUCOSE 150* 175* 83 101*  BUN 21* 26* 38* 36*  CREATININE 1.47* 1.41* 1.54* 1.67*  CALCIUM 8.3* 8.4* 8.7* 8.6*   Liver Function Tests:  Recent Labs Lab 04/02/16 1022 04/03/16 0519  AST 19 14*  ALT 9* 8*  ALKPHOS 54 52  BILITOT 1.7* 1.1  PROT 6.5 6.5  ALBUMIN 3.2* 3.0*    Recent Labs Lab 04/02/16 1022  LIPASE 26   No results for input(s): AMMONIA in the last 168 hours. CBC:  Recent Labs Lab 04/02/16 1022  WBC 11.7*  NEUTROABS 9.7*  HGB 8.3*  HCT 24.7*  MCV 96.1  PLT 190   Cardiac Enzymes: No results for input(s): CKTOTAL, CKMB,  CKMBINDEX, TROPONINI in the last 168 hours. BNP: BNP (last 3 results)  Recent Labs  04/02/16 1848  BNP 1,332.1*    ProBNP (last 3 results) No results for input(s): PROBNP in the last 8760 hours.  CBG:  Recent Labs Lab 04/04/16 0730 04/04/16 1146 04/04/16 1714 04/04/16 2107 04/05/16 0744  GLUCAP 67 94 200* 147* 83     Signed:  Eufemia Prindle S MD.  [facility] 04/05/2016, 11:35 AM

## 2016-04-05 NOTE — Care Management (Signed)
SATURATION QUALIFICATIONS: (This note is used to comply with regulatory documentation for home oxygen)  Patient Saturations on Room Air at Rest = 96 %  Patient Saturations on Room Air while Ambulating = 87%  Patient Saturations on 2 Liters of oxygen while Ambulating = 93 %  Please briefly explain why patient needs home oxygen:

## 2016-04-05 NOTE — Progress Notes (Addendum)
Contacted [facility] to make of dc home today with oxygen and HH. Made rep aware pt will be oxygen continuously. Spoke to attending and orders modified to reflect continuous oxygen at home. Loreli Slot RN CCM Case Mgmt phone [phone]

## 2016-04-07 LAB — CULTURE, BLOOD (ROUTINE X 2): CULTURE: NO GROWTH

## 2016-04-08 ENCOUNTER — Ambulatory Visit: Payer: Medicare Other | Admitting: Cardiology

## 2016-04-09 ENCOUNTER — Ambulatory Visit: Payer: Medicare Other | Admitting: Family Medicine

## 2016-04-09 ENCOUNTER — Telehealth: Payer: Self-pay

## 2016-04-09 DIAGNOSIS — Z792 Long term (current) use of antibiotics: Secondary | ICD-10-CM | POA: Diagnosis not present

## 2016-04-09 DIAGNOSIS — Z7952 Long term (current) use of systemic steroids: Secondary | ICD-10-CM | POA: Diagnosis not present

## 2016-04-09 DIAGNOSIS — F419 Anxiety disorder, unspecified: Secondary | ICD-10-CM | POA: Diagnosis not present

## 2016-04-09 DIAGNOSIS — M1991 Primary osteoarthritis, unspecified site: Secondary | ICD-10-CM | POA: Diagnosis not present

## 2016-04-09 DIAGNOSIS — I482 Chronic atrial fibrillation: Secondary | ICD-10-CM | POA: Diagnosis not present

## 2016-04-09 DIAGNOSIS — Z9981 Dependence on supplemental oxygen: Secondary | ICD-10-CM | POA: Diagnosis not present

## 2016-04-09 DIAGNOSIS — E039 Hypothyroidism, unspecified: Secondary | ICD-10-CM | POA: Diagnosis not present

## 2016-04-09 DIAGNOSIS — J440 Chronic obstructive pulmonary disease with acute lower respiratory infection: Secondary | ICD-10-CM | POA: Diagnosis not present

## 2016-04-09 DIAGNOSIS — I130 Hypertensive heart and chronic kidney disease with heart failure and stage 1 through stage 4 chronic kidney disease, or unspecified chronic kidney disease: Secondary | ICD-10-CM | POA: Diagnosis not present

## 2016-04-09 DIAGNOSIS — I5032 Chronic diastolic (congestive) heart failure: Secondary | ICD-10-CM | POA: Diagnosis not present

## 2016-04-09 DIAGNOSIS — Z7902 Long term (current) use of antithrombotics/antiplatelets: Secondary | ICD-10-CM | POA: Diagnosis not present

## 2016-04-09 DIAGNOSIS — Z8673 Personal history of transient ischemic attack (TIA), and cerebral infarction without residual deficits: Secondary | ICD-10-CM | POA: Diagnosis not present

## 2016-04-09 DIAGNOSIS — I2510 Atherosclerotic heart disease of native coronary artery without angina pectoris: Secondary | ICD-10-CM | POA: Diagnosis not present

## 2016-04-09 DIAGNOSIS — D631 Anemia in chronic kidney disease: Secondary | ICD-10-CM | POA: Diagnosis not present

## 2016-04-09 DIAGNOSIS — Z96651 Presence of right artificial knee joint: Secondary | ICD-10-CM | POA: Diagnosis not present

## 2016-04-09 DIAGNOSIS — Z7984 Long term (current) use of oral hypoglycemic drugs: Secondary | ICD-10-CM | POA: Diagnosis not present

## 2016-04-09 DIAGNOSIS — F329 Major depressive disorder, single episode, unspecified: Secondary | ICD-10-CM | POA: Diagnosis not present

## 2016-04-09 DIAGNOSIS — E785 Hyperlipidemia, unspecified: Secondary | ICD-10-CM | POA: Diagnosis not present

## 2016-04-09 DIAGNOSIS — Z853 Personal history of malignant neoplasm of breast: Secondary | ICD-10-CM | POA: Diagnosis not present

## 2016-04-09 DIAGNOSIS — E1122 Type 2 diabetes mellitus with diabetic chronic kidney disease: Secondary | ICD-10-CM | POA: Diagnosis not present

## 2016-04-09 DIAGNOSIS — J9611 Chronic respiratory failure with hypoxia: Secondary | ICD-10-CM | POA: Diagnosis not present

## 2016-04-09 DIAGNOSIS — N184 Chronic kidney disease, stage 4 (severe): Secondary | ICD-10-CM | POA: Diagnosis not present

## 2016-04-09 DIAGNOSIS — M3130 Wegener's granulomatosis without renal involvement: Secondary | ICD-10-CM | POA: Diagnosis not present

## 2016-04-09 DIAGNOSIS — J181 Lobar pneumonia, unspecified organism: Secondary | ICD-10-CM | POA: Diagnosis not present

## 2016-04-09 NOTE — Telephone Encounter (Signed)
Spoke with pt and she states that she is much improved. She is slowly increasing her daily activities as tolerated.   Appt made with Dr Elease Hashimoto 04/16/16 @ 4:45pm. Pt also has AWV with Manuela Schwartz same afternoon.    Transition Care Management Follow-up Telephone Call  How have you been since you were released from the hospital? improving   Do you understand why you were in the hospital? yes   Do you understand the discharge instrcutions? yes  Items Reviewed:  Medications reviewed: yes  Allergies reviewed: yes  Dietary changes reviewed: yes  Referrals reviewed: yes   Functional Questionnaire:   Activities of Daily Living (ADLs):   She states they are independent in the following: bathing and hygiene, feeding, continence, grooming, toileting and dressing States they require assistance with the following: pt is in wheelchair   Any transportation issues/concerns?: no   Any patient concerns? no   Confirmed importance and date/time of follow-up visits scheduled: yes   Confirmed with patient if condition begins to worsen call PCP or go to the ER.  Patient was given the Call-a-Nurse line [phone]: yes

## 2016-04-09 NOTE — Telephone Encounter (Signed)
D/C 04/05/16 To: home

## 2016-04-11 DIAGNOSIS — J181 Lobar pneumonia, unspecified organism: Secondary | ICD-10-CM | POA: Diagnosis not present

## 2016-04-11 DIAGNOSIS — J440 Chronic obstructive pulmonary disease with acute lower respiratory infection: Secondary | ICD-10-CM | POA: Diagnosis not present

## 2016-04-11 DIAGNOSIS — E1122 Type 2 diabetes mellitus with diabetic chronic kidney disease: Secondary | ICD-10-CM | POA: Diagnosis not present

## 2016-04-11 DIAGNOSIS — I482 Chronic atrial fibrillation: Secondary | ICD-10-CM | POA: Diagnosis not present

## 2016-04-11 DIAGNOSIS — I130 Hypertensive heart and chronic kidney disease with heart failure and stage 1 through stage 4 chronic kidney disease, or unspecified chronic kidney disease: Secondary | ICD-10-CM | POA: Diagnosis not present

## 2016-04-11 DIAGNOSIS — J9611 Chronic respiratory failure with hypoxia: Secondary | ICD-10-CM | POA: Diagnosis not present

## 2016-04-14 DIAGNOSIS — I482 Chronic atrial fibrillation: Secondary | ICD-10-CM | POA: Diagnosis not present

## 2016-04-14 DIAGNOSIS — J440 Chronic obstructive pulmonary disease with acute lower respiratory infection: Secondary | ICD-10-CM | POA: Diagnosis not present

## 2016-04-14 DIAGNOSIS — E1122 Type 2 diabetes mellitus with diabetic chronic kidney disease: Secondary | ICD-10-CM | POA: Diagnosis not present

## 2016-04-14 DIAGNOSIS — J181 Lobar pneumonia, unspecified organism: Secondary | ICD-10-CM | POA: Diagnosis not present

## 2016-04-14 DIAGNOSIS — I130 Hypertensive heart and chronic kidney disease with heart failure and stage 1 through stage 4 chronic kidney disease, or unspecified chronic kidney disease: Secondary | ICD-10-CM | POA: Diagnosis not present

## 2016-04-14 DIAGNOSIS — J9611 Chronic respiratory failure with hypoxia: Secondary | ICD-10-CM | POA: Diagnosis not present

## 2016-04-15 DIAGNOSIS — I130 Hypertensive heart and chronic kidney disease with heart failure and stage 1 through stage 4 chronic kidney disease, or unspecified chronic kidney disease: Secondary | ICD-10-CM | POA: Diagnosis not present

## 2016-04-15 DIAGNOSIS — J181 Lobar pneumonia, unspecified organism: Secondary | ICD-10-CM | POA: Diagnosis not present

## 2016-04-15 DIAGNOSIS — E1122 Type 2 diabetes mellitus with diabetic chronic kidney disease: Secondary | ICD-10-CM | POA: Diagnosis not present

## 2016-04-15 DIAGNOSIS — J440 Chronic obstructive pulmonary disease with acute lower respiratory infection: Secondary | ICD-10-CM | POA: Diagnosis not present

## 2016-04-15 DIAGNOSIS — J9611 Chronic respiratory failure with hypoxia: Secondary | ICD-10-CM | POA: Diagnosis not present

## 2016-04-15 DIAGNOSIS — I482 Chronic atrial fibrillation: Secondary | ICD-10-CM | POA: Diagnosis not present

## 2016-04-16 ENCOUNTER — Ambulatory Visit (INDEPENDENT_AMBULATORY_CARE_PROVIDER_SITE_OTHER): Payer: Medicare Other | Admitting: Family Medicine

## 2016-04-16 ENCOUNTER — Ambulatory Visit (INDEPENDENT_AMBULATORY_CARE_PROVIDER_SITE_OTHER): Payer: Medicare Other

## 2016-04-16 VITALS — BP 148/78 | HR 61 | Ht 66.0 in | Wt 169.0 lb

## 2016-04-16 VITALS — BP 148/78 | Ht 66.0 in | Wt 169.0 lb

## 2016-04-16 DIAGNOSIS — E1122 Type 2 diabetes mellitus with diabetic chronic kidney disease: Secondary | ICD-10-CM

## 2016-04-16 DIAGNOSIS — I5032 Chronic diastolic (congestive) heart failure: Secondary | ICD-10-CM

## 2016-04-16 DIAGNOSIS — Z0000 Encounter for general adult medical examination without abnormal findings: Secondary | ICD-10-CM

## 2016-04-16 DIAGNOSIS — N183 Chronic kidney disease, stage 3 unspecified: Secondary | ICD-10-CM

## 2016-04-16 DIAGNOSIS — I10 Essential (primary) hypertension: Secondary | ICD-10-CM | POA: Diagnosis not present

## 2016-04-16 DIAGNOSIS — J440 Chronic obstructive pulmonary disease with acute lower respiratory infection: Secondary | ICD-10-CM | POA: Diagnosis not present

## 2016-04-16 DIAGNOSIS — E039 Hypothyroidism, unspecified: Secondary | ICD-10-CM

## 2016-04-16 DIAGNOSIS — I482 Chronic atrial fibrillation, unspecified: Secondary | ICD-10-CM

## 2016-04-16 DIAGNOSIS — J9611 Chronic respiratory failure with hypoxia: Secondary | ICD-10-CM | POA: Diagnosis not present

## 2016-04-16 DIAGNOSIS — J181 Lobar pneumonia, unspecified organism: Secondary | ICD-10-CM | POA: Diagnosis not present

## 2016-04-16 DIAGNOSIS — I130 Hypertensive heart and chronic kidney disease with heart failure and stage 1 through stage 4 chronic kidney disease, or unspecified chronic kidney disease: Secondary | ICD-10-CM | POA: Diagnosis not present

## 2016-04-16 NOTE — Patient Instructions (Addendum)
[name] [name] , Thank you for taking time to come for your Medicare Wellness Visit. I appreciate your ongoing commitment to your health goals. Please review the following plan we discussed and let me know if I can assist you in the future.   A Tetanus is recommended every 10 years. Medicare covers a tetanus if you have a cut or wound; otherwise, there may be a charge. If you had not had a tetanus with pertusses, known as the Tdap, you can take this anytime.   Will have an eye exam around March of this year;   Will try to get report from the the Eye center for our records The next exam is in march 2018      These are the goals we discussed: Goals    . patient          Caregiver relief  Sons can look up Derrel Nip   Alzheimer's Association / Family information and training  https://www.clark-whitaker.org/.asp Loss adjuster, chartered for information  Driving resource center; Can send out driving contract  Get help & support Media planner  Support groups  Education programs  SAFETY; TeleconferenceOnDemand.fr.asp#howdementiaaffects  [name]  (please mail donations to this address) [address] [place] [address]  [place], [place] [address] P: [phone] F: [place], [place] [address] P: [phone] F: [place], [address] [place], [place] [address] P: [phone] F: [phone]  Families to call the 800 number to get information on all the resources in the area 24 hour 1 [phone]  Can provide resources; Questions about Dementia; Support groups; Give the caregiver information regarding respite ([facility]) Call the [employer] Aging;as they oversee who gets the grant fund for certain areas ([phone]) Also have Tools to help navigate the needs of the patient  Opportunities for free  educational programs online 100 support groups [place];  Just starting Early stage program and care partners for the patient and the family 12 weeks of a support group;  12 weeks of social engagement component End with Educational wrap up [place] the 800 number given for more information  All's connected  Navigator for those more comfortable with navigating the internet and allows one to develop a plan based on resources;  Website and review the educational calander for programs that are available.  They do care consultations with appointments  Packets for physician offices/  Packets physicians can give to newly dx patients Early stage flyers  Also have examples of Safety services and jewelry   [place] [employer], [place] [address] P: [phone] F: [place]; [phone] [address] [place]; [phone] Get resource to get information on any and all community programs for Seniors  [place]: [phone] Community Health Response Program -[phone] Public Health Dept; Need to be a skilled visit but can assist with bathing as well; [phone]  Adult center for Enrichment;  Call Senior Line; [phone]  Adult day services include Adult Day Care, Adult Day Healthcare, Group Respite, Care Partners, Volunteer In Home Respite, Education and [place] group and information regarding [place] is at the; [employer] Address: [address] [place], [place] [address]  Phone: [phone]               This is a list of the screening recommended for you and due dates:  Health Maintenance  Topic Date Due  .  Tetanus Vaccine  08/26/1951  . Eye exam for diabetics  11/26/2012  . Complete foot exam   09/17/2014  . Hemoglobin A1C  06/01/2016  . Flu Shot  Completed  . DEXA scan (bone density measurement)  Completed  . Shingles Vaccine  Completed  . Pneumonia vaccines  Completed    Fall Prevention in the Home Falls can cause injuries and can affect people from all age groups. There are many simple things that you can do to make your home safe and to help prevent falls. What can I do on the outside of my home?  Regularly repair the edges of walkways and driveways and fix any cracks.  Remove high doorway thresholds.  Trim any shrubbery on the main path into your home.  Use bright outdoor lighting.  Clear walkways of debris and clutter, including tools and rocks.  Regularly check that handrails are securely fastened and in good repair. Both sides of any steps should have handrails.  Install guardrails along the edges of any raised decks or porches.  Have leaves, snow, and ice cleared regularly.  Use sand or salt on walkways during winter months.  In the garage, clean up any spills right away, including grease or oil spills. What can I do in the bathroom?  Use night lights.  Install grab bars by the toilet and in the tub and shower. Do not use towel bars as grab bars.  Use non-skid mats or decals on the floor of the tub or shower.  If you need to sit down while you are in the shower, use a plastic, non-slip stool.  Keep the floor dry. Immediately clean up any water that spills on the floor.  Remove soap buildup in the tub or shower on a regular basis.  Attach bath mats securely with double-sided non-slip rug tape.  Remove throw rugs and other tripping hazards from the floor. What can I do in the bedroom?  Use night lights.  Make sure that a bedside light is easy to reach.  Do not use oversized bedding that drapes onto the floor.  Have a firm chair that has side arms to use for getting dressed.  Remove throw rugs and other tripping hazards from the floor. What can I do in the kitchen?  Clean up any spills right away.  Avoid walking on wet floors.  Place frequently used items in easy-to-reach places.  If you need to reach for something  above you, use a sturdy step stool that has a grab bar.  Keep electrical cables out of the way.  Do not use floor polish or wax that makes floors slippery. If you have to use wax, make sure that it is non-skid floor wax.  Remove throw rugs and other tripping hazards from the floor. What can I do in the stairways?  Do not leave any items on the stairs.  Make sure that there are handrails on both sides of the stairs. Fix handrails that are broken or loose. Make sure that handrails are as long as the stairways.  Check any carpeting to make sure that it is firmly attached to the stairs. Fix any carpet that is loose or worn.  Avoid having throw rugs at the top or bottom of stairways, or secure the rugs with carpet tape to prevent them from moving.  Make sure that you have a light switch at the top of the stairs and the bottom of the stairs. If you do not have them, have them installed.  What are some other fall prevention tips?  Wear closed-toe shoes that fit well and support your feet. Wear shoes that have rubber soles or low heels.  When you use a stepladder, make sure that it is completely opened and that the sides are firmly locked. Have someone hold the ladder while you are using it. Do not climb a closed stepladder.  Add color or contrast paint or tape to grab bars and handrails in your home. Place contrasting color strips on the first and last steps.  Use mobility aids as needed, such as canes, walkers, scooters, and crutches.  Turn on lights if it is dark. Replace any light bulbs that burn out.  Set up furniture so that there are clear paths. Keep the furniture in the same spot.  Fix any uneven floor surfaces.  Choose a carpet design that does not hide the edge of steps of a stairway.  Be aware of any and all pets.  Review your medicines with your healthcare provider. Some medicines can cause dizziness or changes in blood pressure, which increase your risk of falling. Talk  with your health care provider about other ways that you can decrease your risk of falls. This may include working with a physical therapist or trainer to improve your strength, balance, and endurance. This information is not intended to replace advice given to you by your health care provider. Make sure you discuss any questions you have with your health care provider. Document Released: 03/14/2002 Document Revised: 08/21/2015 Document Reviewed: 04/28/2014 Elsevier Interactive Patient Education  2017 [place] Maintenance for Postmenopausal Women Introduction Menopause is a normal process in which your reproductive ability comes to an end. This process happens gradually over a span of months to years, usually between the ages of 61 and 62. Menopause is complete when you have missed 12 consecutive menstrual periods. It is important to talk with your health care provider about some of the most common conditions that affect postmenopausal women, such as heart disease, cancer, and bone loss (osteoporosis). Adopting a healthy lifestyle and getting preventive care can help to promote your health and wellness. Those actions can also lower your chances of developing some of these common conditions. What should I know about menopause? During menopause, you may experience a number of symptoms, such as:  Moderate-to-severe hot flashes.  Night sweats.  Decrease in sex drive.  Mood swings.  Headaches.  Tiredness.  Irritability.  Memory problems.  Insomnia. Choosing to treat or not to treat menopausal changes is an individual decision that you make with your health care provider. What should I know about hormone replacement therapy and supplements? Hormone therapy products are effective for treating symptoms that are associated with menopause, such as hot flashes and night sweats. Hormone replacement carries certain risks, especially as you become older. If you are thinking about  using estrogen or estrogen with progestin treatments, discuss the benefits and risks with your health care provider. What should I know about heart disease and stroke? Heart disease, heart attack, and stroke become more likely as you age. This may be due, in part, to the hormonal changes that your body experiences during menopause. These can affect how your body processes dietary fats, triglycerides, and cholesterol. Heart attack and stroke are both medical emergencies. There are many things that you can do to help prevent heart disease and stroke:  Have your blood pressure checked at least every 1-2 years. High blood pressure causes heart disease and increases the  risk of stroke.  If you are 15-37 years old, ask your health care provider if you should take aspirin to prevent a heart attack or a stroke.  Do not use any tobacco products, including cigarettes, chewing tobacco, or electronic cigarettes. If you need help quitting, ask your health care provider.  It is important to eat a healthy diet and maintain a healthy weight.  Be sure to include plenty of vegetables, fruits, low-fat dairy products, and lean protein.  Avoid eating foods that are high in solid fats, added sugars, or salt (sodium).  Get regular exercise. This is one of the most important things that you can do for your health.  Try to exercise for at least 150 minutes each week. The type of exercise that you do should increase your heart rate and make you sweat. This is known as moderate-intensity exercise.  Try to do strengthening exercises at least twice each week. Do these in addition to the moderate-intensity exercise.  Know your numbers.Ask your health care provider to check your cholesterol and your blood glucose. Continue to have your blood tested as directed by your health care provider. What should I know about cancer screening? There are several types of cancer. Take the following steps to reduce your risk and to  catch any cancer development as early as possible. Breast Cancer  Practice breast self-awareness.  This means understanding how your breasts normally appear and feel.  It also means doing regular breast self-exams. Let your health care provider know about any changes, no matter how small.  If you are 12 or older, have a clinician do a breast exam (clinical breast exam or CBE) every year. Depending on your age, family history, and medical history, it may be recommended that you also have a yearly breast X-ray (mammogram).  If you have a family history of breast cancer, talk with your health care provider about genetic screening.  If you are at high risk for breast cancer, talk with your health care provider about having an MRI and a mammogram every year.  Breast cancer (BRCA) gene test is recommended for women who have family members with BRCA-related cancers. Results of the assessment will determine the need for genetic counseling and BRCA1 and for BRCA2 testing. BRCA-related cancers include these types:  Breast. This occurs in males or females.  Ovarian.  Tubal. This may also be called fallopian tube cancer.  Cancer of the abdominal or pelvic lining (peritoneal cancer).  Prostate.  Pancreatic. Cervical, Uterine, and Ovarian Cancer  Your health care provider may recommend that you be screened regularly for cancer of the pelvic organs. These include your ovaries, uterus, and vagina. This screening involves a pelvic exam, which includes checking for microscopic changes to the surface of your cervix (Pap test).  For women ages 21-65, health care providers may recommend a pelvic exam and a Pap test every three years. For women ages 89-65, they may recommend the Pap test and pelvic exam, combined with testing for human papilloma virus (HPV), every five years. Some types of HPV increase your risk of cervical cancer. Testing for HPV may also be done on women of any age who have unclear Pap  test results.  Other health care providers may not recommend any screening for nonpregnant women who are considered low risk for pelvic cancer and have no symptoms. Ask your health care provider if a screening pelvic exam is right for you.  If you have had past treatment for cervical cancer or a  condition that could lead to cancer, you need Pap tests and screening for cancer for at least 20 years after your treatment. If Pap tests have been discontinued for you, your risk factors (such as having a new sexual partner) need to be reassessed to determine if you should start having screenings again. Some women have medical problems that increase the chance of getting cervical cancer. In these cases, your health care provider may recommend that you have screening and Pap tests more often.  If you have a family history of uterine cancer or ovarian cancer, talk with your health care provider about genetic screening.  If you have vaginal bleeding after reaching menopause, tell your health care provider.  There are currently no reliable tests available to screen for ovarian cancer. Lung Cancer  Lung cancer screening is recommended for adults 37-1 years old who are at high risk for lung cancer because of a history of smoking. A yearly low-dose CT scan of the lungs is recommended if you:  Currently smoke.  Have a history of at least 30 pack-years of smoking and you currently smoke or have quit within the past 15 years. A pack-year is smoking an average of one pack of cigarettes per day for one year. Yearly screening should:  Continue until it has been 15 years since you quit.  Stop if you develop a health problem that would prevent you from having lung cancer treatment. Colorectal Cancer  This type of cancer can be detected and can often be prevented.  Routine colorectal cancer screening usually begins at age 25 and continues through age 53.  If you have risk factors for colon cancer, your health  care provider may recommend that you be screened at an earlier age.  If you have a family history of colorectal cancer, talk with your health care provider about genetic screening.  Your health care provider may also recommend using home test kits to check for hidden blood in your stool.  A small camera at the end of a tube can be used to examine your colon directly (sigmoidoscopy or colonoscopy). This is done to check for the earliest forms of colorectal cancer.  Direct examination of the colon should be repeated every 5-10 years until age 30. However, if early forms of precancerous polyps or small growths are found or if you have a family history or genetic risk for colorectal cancer, you may need to be screened more often. Skin Cancer  Check your skin from head to toe regularly.  Monitor any moles. Be sure to tell your health care provider:  About any new moles or changes in moles, especially if there is a change in a mole's shape or color.  If you have a mole that is larger than the size of a pencil eraser.  If any of your family members has a history of skin cancer, especially at a young age, talk with your health care provider about genetic screening.  Always use sunscreen. Apply sunscreen liberally and repeatedly throughout the day.  Whenever you are outside, protect yourself by wearing long sleeves, pants, a wide-brimmed hat, and sunglasses. What should I know about osteoporosis? Osteoporosis is a condition in which bone destruction happens more quickly than new bone creation. After menopause, you may be at an increased risk for osteoporosis. To help prevent osteoporosis or the bone fractures that can happen because of osteoporosis, the following is recommended:  If you are 40-10 years old, get at least 1,000 mg of calcium and  at least 600 mg of vitamin D per day.  If you are older than age 63 but younger than age 61, get at least 1,200 mg of calcium and at least 600 mg of  vitamin D per day.  If you are older than age 57, get at least 1,200 mg of calcium and at least 800 mg of vitamin D per day. Smoking and excessive alcohol intake increase the risk of osteoporosis. Eat foods that are rich in calcium and vitamin D, and do weight-bearing exercises several times each week as directed by your health care provider. What should I know about how menopause affects my mental health? Depression may occur at any age, but it is more common as you become older. Common symptoms of depression include:  Low or sad mood.  Changes in sleep patterns.  Changes in appetite or eating patterns.  Feeling an overall lack of motivation or enjoyment of activities that you previously enjoyed.  Frequent crying spells. Talk with your health care provider if you think that you are experiencing depression. What should I know about immunizations? It is important that you get and maintain your immunizations. These include:  Tetanus, diphtheria, and pertussis (Tdap) booster vaccine.  Influenza every year before the flu season begins.  Pneumonia vaccine.  Shingles vaccine. Your health care provider may also recommend other immunizations. This information is not intended to replace advice given to you by your health care provider. Make sure you discuss any questions you have with your health care provider. Document Released: 05/16/2005 Document Revised: 10/12/2015 Document Reviewed: 12/26/2014  2017 Elsevier

## 2016-04-16 NOTE — Patient Instructions (Signed)
Follow up for any fever or increasing shortness of breath Continue with night time use of oxygen.

## 2016-04-16 NOTE — Progress Notes (Addendum)
Subjective:   [name] [name] is a 83 y.o. female who presents for Medicare Annual (Subsequent) preventive examination.  The Patient was informed that the wellness visit is to identify future health risk and educate and initiate measures that can reduce risk for increased disease through the lifespan.    NO ROS; Medicare Wellness Visit  Describes health as good, fair or great?  Recent weight loss over the last 4 months; 47 lbs;  spouse has alzheimer's;  3 sons; one stays with them at hs  On oxygen 2 l at hs; only    The following written screening schedule of preventive measures were reviewed with assessment and plan made per below and patient instructions:  Smoking history reviewed / remote  No smoking at home Oxygen; has to be on it at hs;  2 liters at hs  Dental fup yes  RISK FACTORS Regular exercise- PT is working with her now  Diet; cooks as kitchen is arranged for her to get around [employer]; rails in bathroom Elevated toilet;  Gets in tub but has rails; has shower spray   Fall risk: no  Mobility of Functional changes this year? No  Safety at home and  Community reviewed; wears sunscreen if in the sun; not out in sun Keeps firearms in a safe place if they exist / has one but son took others;  Safe driving recommendations for older adults / son drives her now    Depression Screen ok for now  PhQ 2: negative  Activities of Daily Living - See functional screen   Cognitive testing; no issues  Ad8 score; 0 or less than 2  MMSE deferred or completed if AD8 + 2 issues  Advanced Directives reviewed for completion; discussion with MD as well as supportive resources as needed  Patient Care Team: [name], MD as PCP - General Deneise Lever, MD (Pulmonary Disease)   Preventives screens reviewed Colonoscopy; aged out Mammogram every year; hx of breast cancer; Jan or March every year  Dexa / no bone loss 10/2011 (-2.1)  Pap/ good     Immunization  History  Administered Date(s) Administered  . Influenza Split 01/23/2011, 01/09/2012  . Influenza Whole 01/16/2009, 02/06/2010  . Influenza, High Dose Seasonal PF 01/24/2015, 12/31/2015  . Influenza,inj,Quad PF,36+ Mos 12/28/2012, 01/09/2014  . Pneumococcal Conjugate-13 01/24/2015  . Pneumococcal Polysaccharide-23 08/28/2009  . Zoster 03/04/2011   Required Immunizations needed today  Screening test up to date or reviewed for plan of completion Health Maintenance Due  Topic Date Due  . TETANUS/TDAP  08/26/1953  . OPHTHALMOLOGY EXAM  11/26/2012   Had a tetanus;  Did have area on her foot that got red and she was hospitalized;   Will call the eye center to try and get report   Foot exam completed        Objective:     Vitals: BP (!) 148/78   Pulse 61   Ht 5' 6" (1.676 m)   Wt 169 lb (76.7 kg)   SpO2 97%   BMI 27.28 kg/m   Body mass index is 27.28 kg/m.   Tobacco History  Smoking Status  . Former Smoker  . Packs/day: 0.50  . Years: 12.00  . Types: Cigarettes  . Quit date: 06/04/1982  Smokeless Tobacco  . Never Used     Counseling given: Yes   Past Medical History:  Diagnosis Date  . Arthritis    r tkr  . Atrial fibrillation ([place])   . Breast  cancer ([place])   . CVA (cerebral vascular accident) ([place]) 2008   mini stroke.no residual  . DEPRESSION 05/16/2009  . Diabetes mellitus type II   . Heart murmur    stress test 2009.dr peter Martinique  . HYPERLIPIDEMIA 07/24/2008  . HYPERTENSION 07/24/2008  . HYPOTHYROIDISM 07/24/2008  . Intermittent vertigo   . Pneumonia   . Skin abnormality    facial lesions .pt applying mupiracin to areas  . Vertigo   . Wegener's disease, pulmonary ([place]) 10/2012   Past Surgical History:  Procedure Laterality Date  . BREAST SURGERY  2007   Lumpectomy, XRT 2006.l breast  . CHOLECYSTECTOMY  1980  . EXPLORATORY LAPAROTOMY    . HEEL SPUR SURGERY Right   . JOINT REPLACEMENT     r knee  . KNEE SURGERY  2009   TKR  . VIDEO ASSISTED  THORACOSCOPY  04/22/2011   Procedure: VIDEO ASSISTED THORACOSCOPY;  Surgeon: Melrose Nakayama, MD;  Location: [facility] OR;  Service: Thoracic;  Laterality: Left;  WITH BIOPSY   Family History  Problem Relation Age of Onset  . Arthritis Mother   . Rheum arthritis Mother   . Heart disease Father   . Coronary artery disease Father   . Cancer Sister     breast CA, both sisters  . Breast cancer Sister   . Coronary artery disease Brother   . Lung cancer Brother    History  Sexual Activity  . Sexual activity: Yes  . Birth control/ protection: Post-menopausal    Outpatient Encounter Prescriptions as of 04/16/2016  Medication Sig  . amLODipine (NORVASC) 5 MG tablet TAKE ONE TABLET BY MOUTH ONCE DAILY (Patient taking differently: TAKE 3m TABLET BY MOUTH ONCE DAILY in the evening)  . BYSTOLIC 10 MG tablet TAKE ONE TABLET BY MOUTH ONCE DAILY AFTER  BREAKFAST. (Patient taking differently: TAKE 160mTABLET BY MOUTH ONCE DAILY AFTER  BREAKFAST.)  . CALCIUM CITRATE PO Take 1,200 mg by mouth daily with lunch.   . cholecalciferol (VITAMIN D) 1000 UNITS tablet Take 3,000 Units by mouth daily with lunch.   . diazepam (VALIUM) 5 MG tablet Take one tablet by mouth as needed for severe vertigo flares (Patient taking differently: Take 5 mg by mouth daily as needed (severe vertigo..)Marland Kitchen )  . ELIQUIS 2.5 MG TABS tablet TAKE ONE TABLET BY MOUTH TWICE DAILY (Patient taking differently: TAKE 2.42m77mABLET BY MOUTH TWICE DAILY)  . escitalopram (LEXAPRO) 10 MG tablet Take 1 tablet (10 mg total) by mouth daily.  . furosemide (LASIX) 20 MG tablet Take 1 tablet (20 mg total) by mouth daily.  . gMarland Kitchenbapentin (NEURONTIN) 100 MG capsule TAKE ONE CAPSULE BY MOUTH TWICE DAILY BEFORE A  MEAL (Patient taking differently: TAKE 100m74mPSULE BY MOUTH TWICE DAILY BEFORE A  MEAL)  . glimepiride (AMARYL) 2 MG tablet Take 1 tablet (2 mg total) by mouth daily with breakfast. TAKE ONE TABLET BY MOUTH ONCE DAILY BEFORE BREAKFAST  .  guaiFENesin (MUCINEX) 600 MG 12 hr tablet Take 600 mg by mouth 2 (two) times daily as needed for cough or to loosen phlegm.  . leMarland Kitchenothyroxine (SYNTHROID, LEVOTHROID) 100 MCG tablet Take 1 tablet (100 mcg total) by mouth daily before breakfast.  . loratadine (CLARITIN) 10 MG tablet Take 10 mg by mouth daily as needed for allergies.  . predniSONE (DELTASONE) 5 MG tablet Take 1 tablet (5 mg total) by mouth daily with breakfast. (Patient taking differently: Take 5 mg by mouth every other day. )  . triamcinolone  cream (KENALOG) 0.1 % Apply 1 application topically 2 (two) times daily as needed.  . [DISCONTINUED] levofloxacin (LEVAQUIN) 500 MG tablet Take 1 tablet (500 mg total) by mouth daily.   No facility-administered encounter medications on file as of 04/16/2016.     Activities of Daily Living In your present state of health, do you have any difficulty performing the following activities: 04/16/2016 04/02/2016  Hearing? N N  Vision? N N  Difficulty concentrating or making decisions? N N  Walking or climbing stairs? Y Y  Dressing or bathing? N N  Doing errands, shopping? N Y  Conservation officer, nature and eating ? N -  Using the Toilet? N -  In the past six months, have you accidently leaked urine? N -  Do you have problems with loss of bowel control? N -  Managing your Medications? N -  Managing your Finances? N -  Housekeeping or managing your Housekeeping? N -  Some recent data might be hidden    Patient Care Team: [name], MD as PCP - General Deneise Lever, MD (Pulmonary Disease)    Assessment:     Exercise Activities and Dietary recommendations    Goals    . patient          Caregiver relief  Sons can look up Derrel Nip   Alzheimer's Association / Family information and training  https://www.clark-whitaker.org/.asp Loss adjuster, chartered for information  Driving resource center; Can send out driving contract  Get help & support Location manager  Support groups  Education programs  SAFETY; TeleconferenceOnDemand.fr.asp#howdementiaaffects  [name]  (please mail donations to this address) [address], [address]  [place], [place] [address] P: [phone] F: [place], [place] [address] P: [phone] F: [address], [address] [place], [place] [address] P: [phone] F: [phone]  Families to call the 800 number to get information on all the resources in the area 24 hour 1 [phone]  Can provide resources; Questions about Dementia; Support groups; Give the caregiver information regarding respite ([facility]) Call the [place] on Aging;as they oversee who gets the grant fund for certain areas ([phone]) Also have Tools to help navigate the needs of the patient  Opportunities for free educational programs online 100 support groups [place];  Just starting Early stage program and care partners for the patient and the family 12 weeks of a support group;  12 weeks of social engagement component End with Educational wrap up Cal the 800 number given for more information  All's connected  Navigator for those more comfortable with navigating the internet and allows one to develop a plan based on resources;  Website and review the educational calander for programs that are available.  They do care consultations with appointments  Packets for physician offices/  Packets physicians can give to newly dx patients Early stage flyers  Also have examples of Safety services and jewelry   [place],  [address] P: [phone] F: [place]; [phone] Sr. [place]; [phone] Get resource to get information on any and all community programs for Seniors  [place]: [phone] Community Health Response Program  -[phone] Public Health Dept; Need to be a skilled visit but can assist with bathing as well; [phone]  Adult center for Enrichment;  Call Senior Line; 2721505506  Adult day services include Adult Day Care, [place], Volunteer In [place]  Respite, Education and Support Program   Caregiver support group and information regarding [place] is at the; [facility] Address: [address] [place], [place] [address]  Phone: [phone]              Fall Risk Fall Risk  04/16/2016 07/31/2015 04/17/2014 02/09/2014 10/27/2012  Falls in the past year? _0    Depression Screen PHQ 2/9 Scores 04/16/2016 07/31/2015 04/17/2014 10/27/2012  PHQ - 2 Score 0 0 0 1     Cognitive Function/ no memory issues Spouse has Alz        Immunization History  Administered Date(s) Administered  . Influenza Split 01/23/2011, 01/09/2012  . Influenza Whole 01/16/2009, 02/06/2010  . Influenza, High Dose Seasonal PF 01/24/2015, 12/31/2015  . Influenza,inj,Quad PF,36+ Mos 12/28/2012, 01/09/2014  . Pneumococcal Conjugate-13 01/24/2015  . Pneumococcal Polysaccharide-23 08/28/2009  . Zoster 03/04/2011   Screening Tests Health Maintenance  Topic Date Due  . TETANUS/TDAP  08[birth year]26/1953  . OPHTHALMOLOGY EXAM  11/26/2012  . HEMOGLOBIN A1C  06/01/2016  . FOOT EXAM  04/16/2017  . INFLUENZA VACCINE  Completed  . DEXA SCAN  Completed  . ZOSTAVAX  Completed  . PNA vac Low Risk Adult  Completed      Plan:   A Tetanus is recommended every 10 years. Medicare covers a tetanus if you have a cut or wound; otherwise, there may be a charge. If you had not had a tetanus with pertusses, known as the Tdap, you can take this anytime.   Will have an eye exam around March of this year;   Will try to get report from the the Eye center for our records The next exam is in march 2018   During the course of the visit the patient  was educated and counseled about the following appropriate screening and preventive services:   Vaccines to include Pneumoccal, Influenza, Hepatitis B, Td, Zostavax, HCV  Electrocardiogram  Cardiovascular Disease  Colorectal cancer screening  Bone density screening  Diabetes screening  Glaucoma screening  Mammography/PAP  Nutrition counseling   Patient Instructions (the written plan) was given to the patient.   NBVAP,OLIDC, RN  04/16/2016  Above notes/ assessment reviewed and agree  [name] MD [facility]

## 2016-04-16 NOTE — Progress Notes (Signed)
Subjective:     Patient ID: [name] [name], female   DOB: [birth year], 83 y.o.   MRN: [id number]  HPI Patient was seen today by our health coach for Medicare subsequent annual wellness visit. She is also seen by me today for transitional care management follow-up regarding recent admission 12/27 through 04/05/2016  She has multiple chronic problems including obesity, Wegener's granulomatosis, pulmonary hypertension, essential hypertension, diastolic heart failure, atrial fibrillation, type 2 diabetes, hypothyroidism, idiopathic peripheral neuropathy, chronic kidney disease. She was admitted on 04/02/2016 with fatigue, decreased appetite, vomiting, shortness of breath, cough, fever. Was diagnosed with left upper lobe pneumonia. Her blood cultures remained negative. She was treated with ceftriaxone and Zithromax. She was transitioned to Levaquin 500 milligrams once daily for 5 days at discharge.  Elevated BNP level of 1332 on admission. She was treated with Lasix. Echocardiogram ejection fraction 55-60% with indeterminate diastolic function due to underlying atrial fibrillation. Was continued on Lasix 20 mg daily at discharge.  Pulmonary hypertension with pulmonary artery pressure 62 mm and recommendation to continue home oxygen 2 L/m. She has been using her oxygen consistently at night with good success. She's had previous overnight pulse oximetries which showed desaturations at night with sleep. She does not use oxygen during the day. No dyspnea at rest.  History of atrial fibrillation which has been rate controlled. She remains on anticoagulation. She has type 2 diabetes which has been well controlled with glimepiride. No hypoglycemia. Hypertension stable on Bystolic and amlodipine  She's had some trace bilateral leg and ankle edema today but generally stable since discharge. She is not weighing herself daily.  Past Medical History:  Diagnosis Date  . Arthritis    r tkr  . Atrial fibrillation ([place])    . Breast cancer ([place])   . CVA (cerebral vascular accident) ([place]) 2008   mini stroke.no residual  . DEPRESSION 05/16/2009  . Diabetes mellitus type II   . Heart murmur    stress test 2009.dr peter Martinique  . HYPERLIPIDEMIA 07/24/2008  . HYPERTENSION 07/24/2008  . HYPOTHYROIDISM 07/24/2008  . Intermittent vertigo   . Pneumonia   . Skin abnormality    facial lesions .pt applying mupiracin to areas  . Vertigo   . Wegener's disease, pulmonary ([place]) 10/2012   Past Surgical History:  Procedure Laterality Date  . BREAST SURGERY  2007   Lumpectomy, XRT 2006.l breast  . CHOLECYSTECTOMY  1980  . EXPLORATORY LAPAROTOMY    . HEEL SPUR SURGERY Right   . JOINT REPLACEMENT     r knee  . KNEE SURGERY  2009   TKR  . VIDEO ASSISTED THORACOSCOPY  04/22/2011   Procedure: VIDEO ASSISTED THORACOSCOPY;  Surgeon: Melrose Nakayama, MD;  Location: [facility];  Service: Thoracic;  Laterality: Left;  WITH BIOPSY    reports that she quit smoking about 33 years ago. Her smoking use included Cigarettes. She has a 6.00 pack-year smoking history. She has never used smokeless tobacco. She reports that she does not drink alcohol or use drugs. family history includes Arthritis in her mother; Breast cancer in her sister; Cancer in her sister; Coronary artery disease in her brother and father; Heart disease in her father; Lung cancer in her brother; Rheum arthritis in her mother. Allergies  Allergen Reactions  . Codeine Sulfate Other (See Comments)    REACTION: GI upset  . Sulfonamide Derivatives Other (See Comments)    REACTION: GI upset  . Vancomycin Other (See Comments)    Red man syndrome  .  Atarax [Hydroxyzine] Nausea Only and Rash      Review of Systems  Constitutional: Positive for fatigue. Negative for chills and fever.  Respiratory: Negative for cough, shortness of breath and wheezing.   Cardiovascular: Positive for leg swelling. Negative for chest pain and palpitations.  Gastrointestinal: Negative for  abdominal pain, diarrhea, nausea and vomiting.  Endocrine: Negative for polydipsia and polyuria.  Genitourinary: Negative for dysuria.  Psychiatric/Behavioral: Negative for confusion.       Objective:   Physical Exam  Constitutional: She is oriented to person, place, and time. She appears well-developed and well-nourished.  HENT:  Mouth/Throat: Oropharynx is clear and moist.  Neck: Neck supple. No thyromegaly present.  Cardiovascular: Normal rate and regular rhythm.   Pulmonary/Chest: Effort normal and breath sounds normal. No respiratory distress. She has no wheezes. She has no rales.  Musculoskeletal: She exhibits edema.  She has trace ankle and lower leg edema bilaterally  Neurological: She is alert and oriented to person, place, and time. No cranial nerve deficit.       Assessment:     #1 recent community-acquired pneumonia left upper lobe clinically improved  #2 question of diastolic heart failure improved. She has trace leg edema today but overall stable on Lasix 20 mg daily  #3 hypertension stable  #4 history of pulmonary hypertension on nighttime oxygen and getting benefit at 2 L/m nasal cannula  #5 type 2 diabetes which has been well controlled  #6 atrial fibrillation on anticoagulation  #7 hypothyroidism      Plan:     -Continue with home O2 at 2 L nasal cannula at night. We had to fill out  forms today to get this continued -Continue with current medication regimen -Follow-up immediately for any fever or increasing shortness of breath  [name] MD [facility]

## 2016-04-18 DIAGNOSIS — E1122 Type 2 diabetes mellitus with diabetic chronic kidney disease: Secondary | ICD-10-CM | POA: Diagnosis not present

## 2016-04-18 DIAGNOSIS — I130 Hypertensive heart and chronic kidney disease with heart failure and stage 1 through stage 4 chronic kidney disease, or unspecified chronic kidney disease: Secondary | ICD-10-CM | POA: Diagnosis not present

## 2016-04-18 DIAGNOSIS — J440 Chronic obstructive pulmonary disease with acute lower respiratory infection: Secondary | ICD-10-CM | POA: Diagnosis not present

## 2016-04-18 DIAGNOSIS — I482 Chronic atrial fibrillation: Secondary | ICD-10-CM | POA: Diagnosis not present

## 2016-04-18 DIAGNOSIS — J9611 Chronic respiratory failure with hypoxia: Secondary | ICD-10-CM | POA: Diagnosis not present

## 2016-04-18 DIAGNOSIS — J181 Lobar pneumonia, unspecified organism: Secondary | ICD-10-CM | POA: Diagnosis not present

## 2016-04-22 DIAGNOSIS — J181 Lobar pneumonia, unspecified organism: Secondary | ICD-10-CM | POA: Diagnosis not present

## 2016-04-22 DIAGNOSIS — J440 Chronic obstructive pulmonary disease with acute lower respiratory infection: Secondary | ICD-10-CM | POA: Diagnosis not present

## 2016-04-22 DIAGNOSIS — E1122 Type 2 diabetes mellitus with diabetic chronic kidney disease: Secondary | ICD-10-CM | POA: Diagnosis not present

## 2016-04-22 DIAGNOSIS — I482 Chronic atrial fibrillation: Secondary | ICD-10-CM | POA: Diagnosis not present

## 2016-04-22 DIAGNOSIS — I130 Hypertensive heart and chronic kidney disease with heart failure and stage 1 through stage 4 chronic kidney disease, or unspecified chronic kidney disease: Secondary | ICD-10-CM | POA: Diagnosis not present

## 2016-04-22 DIAGNOSIS — J9611 Chronic respiratory failure with hypoxia: Secondary | ICD-10-CM | POA: Diagnosis not present

## 2016-04-29 ENCOUNTER — Telehealth: Payer: Self-pay | Admitting: Family Medicine

## 2016-04-29 DIAGNOSIS — I130 Hypertensive heart and chronic kidney disease with heart failure and stage 1 through stage 4 chronic kidney disease, or unspecified chronic kidney disease: Secondary | ICD-10-CM | POA: Diagnosis not present

## 2016-04-29 DIAGNOSIS — J440 Chronic obstructive pulmonary disease with acute lower respiratory infection: Secondary | ICD-10-CM | POA: Diagnosis not present

## 2016-04-29 DIAGNOSIS — J181 Lobar pneumonia, unspecified organism: Secondary | ICD-10-CM | POA: Diagnosis not present

## 2016-04-29 DIAGNOSIS — E1122 Type 2 diabetes mellitus with diabetic chronic kidney disease: Secondary | ICD-10-CM | POA: Diagnosis not present

## 2016-04-29 DIAGNOSIS — I482 Chronic atrial fibrillation: Secondary | ICD-10-CM | POA: Diagnosis not present

## 2016-04-29 DIAGNOSIS — J9611 Chronic respiratory failure with hypoxia: Secondary | ICD-10-CM | POA: Diagnosis not present

## 2016-04-29 NOTE — Telephone Encounter (Signed)
FYI--please advise.

## 2016-04-29 NOTE — Telephone Encounter (Signed)
Agree with advice given

## 2016-04-29 NOTE — Telephone Encounter (Signed)
If she has left sided numbness that persists (ie > few minutes) should consider ER assessment- especially if upper and lower extremity.

## 2016-04-29 NOTE — Telephone Encounter (Signed)
Spoke with pt, she denies any numbness at this time. She feels that she slept wrong. I advised if any further sx arise that she needs to make someone aware and possibly go to ER.

## 2016-04-29 NOTE — Telephone Encounter (Signed)
[name] [name] is calling to report pt had left side numbness when she woke up today . Pt does not have any weakness and coordination is fine bp 138/72. Please advise

## 2016-04-30 DIAGNOSIS — J181 Lobar pneumonia, unspecified organism: Secondary | ICD-10-CM | POA: Diagnosis not present

## 2016-04-30 DIAGNOSIS — I130 Hypertensive heart and chronic kidney disease with heart failure and stage 1 through stage 4 chronic kidney disease, or unspecified chronic kidney disease: Secondary | ICD-10-CM | POA: Diagnosis not present

## 2016-04-30 DIAGNOSIS — J9611 Chronic respiratory failure with hypoxia: Secondary | ICD-10-CM | POA: Diagnosis not present

## 2016-04-30 DIAGNOSIS — J440 Chronic obstructive pulmonary disease with acute lower respiratory infection: Secondary | ICD-10-CM | POA: Diagnosis not present

## 2016-04-30 DIAGNOSIS — I482 Chronic atrial fibrillation: Secondary | ICD-10-CM | POA: Diagnosis not present

## 2016-04-30 DIAGNOSIS — E1122 Type 2 diabetes mellitus with diabetic chronic kidney disease: Secondary | ICD-10-CM | POA: Diagnosis not present

## 2016-05-05 DIAGNOSIS — I482 Chronic atrial fibrillation: Secondary | ICD-10-CM | POA: Diagnosis not present

## 2016-05-05 DIAGNOSIS — J440 Chronic obstructive pulmonary disease with acute lower respiratory infection: Secondary | ICD-10-CM | POA: Diagnosis not present

## 2016-05-05 DIAGNOSIS — J9611 Chronic respiratory failure with hypoxia: Secondary | ICD-10-CM | POA: Diagnosis not present

## 2016-05-05 DIAGNOSIS — I130 Hypertensive heart and chronic kidney disease with heart failure and stage 1 through stage 4 chronic kidney disease, or unspecified chronic kidney disease: Secondary | ICD-10-CM | POA: Diagnosis not present

## 2016-05-05 DIAGNOSIS — E1122 Type 2 diabetes mellitus with diabetic chronic kidney disease: Secondary | ICD-10-CM | POA: Diagnosis not present

## 2016-05-05 DIAGNOSIS — J181 Lobar pneumonia, unspecified organism: Secondary | ICD-10-CM | POA: Diagnosis not present

## 2016-05-06 ENCOUNTER — Telehealth: Payer: Self-pay | Admitting: Family Medicine

## 2016-05-06 DIAGNOSIS — J181 Lobar pneumonia, unspecified organism: Secondary | ICD-10-CM | POA: Diagnosis not present

## 2016-05-06 DIAGNOSIS — I482 Chronic atrial fibrillation: Secondary | ICD-10-CM | POA: Diagnosis not present

## 2016-05-06 DIAGNOSIS — E1122 Type 2 diabetes mellitus with diabetic chronic kidney disease: Secondary | ICD-10-CM | POA: Diagnosis not present

## 2016-05-06 DIAGNOSIS — I130 Hypertensive heart and chronic kidney disease with heart failure and stage 1 through stage 4 chronic kidney disease, or unspecified chronic kidney disease: Secondary | ICD-10-CM | POA: Diagnosis not present

## 2016-05-06 DIAGNOSIS — J9611 Chronic respiratory failure with hypoxia: Secondary | ICD-10-CM | POA: Diagnosis not present

## 2016-05-06 DIAGNOSIS — J440 Chronic obstructive pulmonary disease with acute lower respiratory infection: Secondary | ICD-10-CM | POA: Diagnosis not present

## 2016-05-06 NOTE — Telephone Encounter (Signed)
Beth with [facility] would like the dr to know pt has new wheezing in right upper lobe. No increased shortness of breath.  Pt states she had some issues with her O2 concentrator, may have had something to do with it

## 2016-05-07 DIAGNOSIS — I130 Hypertensive heart and chronic kidney disease with heart failure and stage 1 through stage 4 chronic kidney disease, or unspecified chronic kidney disease: Secondary | ICD-10-CM | POA: Diagnosis not present

## 2016-05-07 DIAGNOSIS — J181 Lobar pneumonia, unspecified organism: Secondary | ICD-10-CM | POA: Diagnosis not present

## 2016-05-07 DIAGNOSIS — J440 Chronic obstructive pulmonary disease with acute lower respiratory infection: Secondary | ICD-10-CM | POA: Diagnosis not present

## 2016-05-07 DIAGNOSIS — E1122 Type 2 diabetes mellitus with diabetic chronic kidney disease: Secondary | ICD-10-CM | POA: Diagnosis not present

## 2016-05-07 DIAGNOSIS — I482 Chronic atrial fibrillation: Secondary | ICD-10-CM | POA: Diagnosis not present

## 2016-05-07 DIAGNOSIS — J9611 Chronic respiratory failure with hypoxia: Secondary | ICD-10-CM | POA: Diagnosis not present

## 2016-05-07 NOTE — Telephone Encounter (Signed)
Spoke with pt and her O2 tank is fixed and she is feeling much better. She will call if SX are recurrent.

## 2016-05-12 DIAGNOSIS — E1122 Type 2 diabetes mellitus with diabetic chronic kidney disease: Secondary | ICD-10-CM | POA: Diagnosis not present

## 2016-05-12 DIAGNOSIS — J440 Chronic obstructive pulmonary disease with acute lower respiratory infection: Secondary | ICD-10-CM | POA: Diagnosis not present

## 2016-05-12 DIAGNOSIS — I482 Chronic atrial fibrillation: Secondary | ICD-10-CM | POA: Diagnosis not present

## 2016-05-12 DIAGNOSIS — I130 Hypertensive heart and chronic kidney disease with heart failure and stage 1 through stage 4 chronic kidney disease, or unspecified chronic kidney disease: Secondary | ICD-10-CM | POA: Diagnosis not present

## 2016-05-12 DIAGNOSIS — J181 Lobar pneumonia, unspecified organism: Secondary | ICD-10-CM | POA: Diagnosis not present

## 2016-05-12 DIAGNOSIS — J9611 Chronic respiratory failure with hypoxia: Secondary | ICD-10-CM | POA: Diagnosis not present

## 2016-05-13 ENCOUNTER — Other Ambulatory Visit: Payer: Self-pay | Admitting: Family Medicine

## 2016-05-19 ENCOUNTER — Ambulatory Visit: Payer: Medicare Other | Admitting: Internal Medicine

## 2016-05-24 DIAGNOSIS — J181 Lobar pneumonia, unspecified organism: Secondary | ICD-10-CM | POA: Diagnosis not present

## 2016-05-24 DIAGNOSIS — J440 Chronic obstructive pulmonary disease with acute lower respiratory infection: Secondary | ICD-10-CM | POA: Diagnosis not present

## 2016-05-24 DIAGNOSIS — J9611 Chronic respiratory failure with hypoxia: Secondary | ICD-10-CM | POA: Diagnosis not present

## 2016-05-24 DIAGNOSIS — I130 Hypertensive heart and chronic kidney disease with heart failure and stage 1 through stage 4 chronic kidney disease, or unspecified chronic kidney disease: Secondary | ICD-10-CM | POA: Diagnosis not present

## 2016-05-24 DIAGNOSIS — E1122 Type 2 diabetes mellitus with diabetic chronic kidney disease: Secondary | ICD-10-CM | POA: Diagnosis not present

## 2016-05-24 DIAGNOSIS — I482 Chronic atrial fibrillation: Secondary | ICD-10-CM | POA: Diagnosis not present

## 2016-05-27 DIAGNOSIS — E1122 Type 2 diabetes mellitus with diabetic chronic kidney disease: Secondary | ICD-10-CM | POA: Diagnosis not present

## 2016-05-27 DIAGNOSIS — I130 Hypertensive heart and chronic kidney disease with heart failure and stage 1 through stage 4 chronic kidney disease, or unspecified chronic kidney disease: Secondary | ICD-10-CM | POA: Diagnosis not present

## 2016-05-27 DIAGNOSIS — I482 Chronic atrial fibrillation: Secondary | ICD-10-CM | POA: Diagnosis not present

## 2016-05-27 DIAGNOSIS — J440 Chronic obstructive pulmonary disease with acute lower respiratory infection: Secondary | ICD-10-CM | POA: Diagnosis not present

## 2016-05-27 DIAGNOSIS — J181 Lobar pneumonia, unspecified organism: Secondary | ICD-10-CM | POA: Diagnosis not present

## 2016-05-27 DIAGNOSIS — J9611 Chronic respiratory failure with hypoxia: Secondary | ICD-10-CM | POA: Diagnosis not present

## 2016-06-04 DIAGNOSIS — J181 Lobar pneumonia, unspecified organism: Secondary | ICD-10-CM | POA: Diagnosis not present

## 2016-06-04 DIAGNOSIS — E1122 Type 2 diabetes mellitus with diabetic chronic kidney disease: Secondary | ICD-10-CM | POA: Diagnosis not present

## 2016-06-04 DIAGNOSIS — J9611 Chronic respiratory failure with hypoxia: Secondary | ICD-10-CM | POA: Diagnosis not present

## 2016-06-04 DIAGNOSIS — I130 Hypertensive heart and chronic kidney disease with heart failure and stage 1 through stage 4 chronic kidney disease, or unspecified chronic kidney disease: Secondary | ICD-10-CM | POA: Diagnosis not present

## 2016-06-04 DIAGNOSIS — J440 Chronic obstructive pulmonary disease with acute lower respiratory infection: Secondary | ICD-10-CM | POA: Diagnosis not present

## 2016-06-04 DIAGNOSIS — I482 Chronic atrial fibrillation: Secondary | ICD-10-CM | POA: Diagnosis not present

## 2016-06-30 ENCOUNTER — Encounter (HOSPITAL_COMMUNITY): Payer: Self-pay | Admitting: Emergency Medicine

## 2016-06-30 ENCOUNTER — Emergency Department (HOSPITAL_COMMUNITY): Payer: Medicare Other

## 2016-06-30 ENCOUNTER — Inpatient Hospital Stay (HOSPITAL_COMMUNITY): Payer: Medicare Other

## 2016-06-30 ENCOUNTER — Telehealth: Payer: Self-pay

## 2016-06-30 ENCOUNTER — Inpatient Hospital Stay (HOSPITAL_COMMUNITY)
Admission: EM | Admit: 2016-06-30 | Discharge: 2016-07-05 | DRG: 354 | Disposition: A | Discharge status: Home / Self Care | Payer: Medicare Other | Attending: Internal Medicine | Admitting: Internal Medicine

## 2016-06-30 DIAGNOSIS — Z9049 Acquired absence of other specified parts of digestive tract: Secondary | ICD-10-CM

## 2016-06-30 DIAGNOSIS — Z881 Allergy status to other antibiotic agents status: Secondary | ICD-10-CM

## 2016-06-30 DIAGNOSIS — Z803 Family history of malignant neoplasm of breast: Secondary | ICD-10-CM

## 2016-06-30 DIAGNOSIS — E440 Moderate protein-calorie malnutrition: Secondary | ICD-10-CM | POA: Diagnosis not present

## 2016-06-30 DIAGNOSIS — I482 Chronic atrial fibrillation, unspecified: Secondary | ICD-10-CM | POA: Diagnosis present

## 2016-06-30 DIAGNOSIS — Z8673 Personal history of transient ischemic attack (TIA), and cerebral infarction without residual deficits: Secondary | ICD-10-CM

## 2016-06-30 DIAGNOSIS — E039 Hypothyroidism, unspecified: Secondary | ICD-10-CM | POA: Diagnosis present

## 2016-06-30 DIAGNOSIS — K460 Unspecified abdominal hernia with obstruction, without gangrene: Secondary | ICD-10-CM | POA: Diagnosis present

## 2016-06-30 DIAGNOSIS — D649 Anemia, unspecified: Secondary | ICD-10-CM | POA: Diagnosis present

## 2016-06-30 DIAGNOSIS — R1110 Vomiting, unspecified: Secondary | ICD-10-CM | POA: Diagnosis not present

## 2016-06-30 DIAGNOSIS — G609 Hereditary and idiopathic neuropathy, unspecified: Secondary | ICD-10-CM | POA: Diagnosis present

## 2016-06-30 DIAGNOSIS — F329 Major depressive disorder, single episode, unspecified: Secondary | ICD-10-CM | POA: Diagnosis present

## 2016-06-30 DIAGNOSIS — N184 Chronic kidney disease, stage 4 (severe): Secondary | ICD-10-CM | POA: Diagnosis not present

## 2016-06-30 DIAGNOSIS — Z4659 Encounter for fitting and adjustment of other gastrointestinal appliance and device: Secondary | ICD-10-CM

## 2016-06-30 DIAGNOSIS — E119 Type 2 diabetes mellitus without complications: Secondary | ICD-10-CM

## 2016-06-30 DIAGNOSIS — I10 Essential (primary) hypertension: Secondary | ICD-10-CM | POA: Diagnosis not present

## 2016-06-30 DIAGNOSIS — Z01810 Encounter for preprocedural cardiovascular examination: Secondary | ICD-10-CM | POA: Diagnosis not present

## 2016-06-30 DIAGNOSIS — M3130 Wegener's granulomatosis without renal involvement: Secondary | ICD-10-CM | POA: Diagnosis present

## 2016-06-30 DIAGNOSIS — K420 Umbilical hernia with obstruction, without gangrene: Secondary | ICD-10-CM | POA: Diagnosis not present

## 2016-06-30 DIAGNOSIS — Z853 Personal history of malignant neoplasm of breast: Secondary | ICD-10-CM

## 2016-06-30 DIAGNOSIS — Z0189 Encounter for other specified special examinations: Secondary | ICD-10-CM

## 2016-06-30 DIAGNOSIS — Z6825 Body mass index (BMI) 25.0-25.9, adult: Secondary | ICD-10-CM

## 2016-06-30 DIAGNOSIS — I272 Pulmonary hypertension, unspecified: Secondary | ICD-10-CM | POA: Diagnosis present

## 2016-06-30 DIAGNOSIS — I5032 Chronic diastolic (congestive) heart failure: Secondary | ICD-10-CM | POA: Diagnosis present

## 2016-06-30 DIAGNOSIS — N183 Chronic kidney disease, stage 3 (moderate): Secondary | ICD-10-CM

## 2016-06-30 DIAGNOSIS — Z794 Long term (current) use of insulin: Secondary | ICD-10-CM

## 2016-06-30 DIAGNOSIS — K56609 Unspecified intestinal obstruction, unspecified as to partial versus complete obstruction: Secondary | ICD-10-CM

## 2016-06-30 DIAGNOSIS — K6389 Other specified diseases of intestine: Secondary | ICD-10-CM | POA: Diagnosis not present

## 2016-06-30 DIAGNOSIS — Z7901 Long term (current) use of anticoagulants: Secondary | ICD-10-CM

## 2016-06-30 DIAGNOSIS — E785 Hyperlipidemia, unspecified: Secondary | ICD-10-CM | POA: Diagnosis present

## 2016-06-30 DIAGNOSIS — Z79899 Other long term (current) drug therapy: Secondary | ICD-10-CM

## 2016-06-30 DIAGNOSIS — Z87891 Personal history of nicotine dependence: Secondary | ICD-10-CM | POA: Diagnosis not present

## 2016-06-30 DIAGNOSIS — Z96651 Presence of right artificial knee joint: Secondary | ICD-10-CM | POA: Diagnosis present

## 2016-06-30 DIAGNOSIS — K430 Incisional hernia with obstruction, without gangrene: Principal | ICD-10-CM | POA: Diagnosis present

## 2016-06-30 DIAGNOSIS — J449 Chronic obstructive pulmonary disease, unspecified: Secondary | ICD-10-CM | POA: Diagnosis present

## 2016-06-30 DIAGNOSIS — K439 Ventral hernia without obstruction or gangrene: Secondary | ICD-10-CM | POA: Diagnosis not present

## 2016-06-30 DIAGNOSIS — Z7952 Long term (current) use of systemic steroids: Secondary | ICD-10-CM

## 2016-06-30 DIAGNOSIS — Z8249 Family history of ischemic heart disease and other diseases of the circulatory system: Secondary | ICD-10-CM

## 2016-06-30 DIAGNOSIS — E1122 Type 2 diabetes mellitus with diabetic chronic kidney disease: Secondary | ICD-10-CM | POA: Diagnosis not present

## 2016-06-30 DIAGNOSIS — Z4682 Encounter for fitting and adjustment of non-vascular catheter: Secondary | ICD-10-CM | POA: Diagnosis not present

## 2016-06-30 DIAGNOSIS — I130 Hypertensive heart and chronic kidney disease with heart failure and stage 1 through stage 4 chronic kidney disease, or unspecified chronic kidney disease: Secondary | ICD-10-CM | POA: Diagnosis present

## 2016-06-30 DIAGNOSIS — L7622 Postprocedural hemorrhage and hematoma of skin and subcutaneous tissue following other procedure: Secondary | ICD-10-CM | POA: Diagnosis not present

## 2016-06-30 DIAGNOSIS — N179 Acute kidney failure, unspecified: Secondary | ICD-10-CM | POA: Diagnosis present

## 2016-06-30 DIAGNOSIS — R109 Unspecified abdominal pain: Secondary | ICD-10-CM | POA: Diagnosis not present

## 2016-06-30 DIAGNOSIS — Z85030 Personal history of malignant carcinoid tumor of large intestine: Secondary | ICD-10-CM

## 2016-06-30 DIAGNOSIS — Z923 Personal history of irradiation: Secondary | ICD-10-CM

## 2016-06-30 DIAGNOSIS — Z882 Allergy status to sulfonamides status: Secondary | ICD-10-CM

## 2016-06-30 DIAGNOSIS — K56691 Other complete intestinal obstruction: Secondary | ICD-10-CM | POA: Diagnosis not present

## 2016-06-30 DIAGNOSIS — Z801 Family history of malignant neoplasm of trachea, bronchus and lung: Secondary | ICD-10-CM

## 2016-06-30 DIAGNOSIS — R42 Dizziness and giddiness: Secondary | ICD-10-CM | POA: Diagnosis present

## 2016-06-30 DIAGNOSIS — E1142 Type 2 diabetes mellitus with diabetic polyneuropathy: Secondary | ICD-10-CM | POA: Diagnosis present

## 2016-06-30 DIAGNOSIS — F419 Anxiety disorder, unspecified: Secondary | ICD-10-CM | POA: Diagnosis present

## 2016-06-30 DIAGNOSIS — Z885 Allergy status to narcotic agent status: Secondary | ICD-10-CM

## 2016-06-30 DIAGNOSIS — Z7984 Long term (current) use of oral hypoglycemic drugs: Secondary | ICD-10-CM

## 2016-06-30 DIAGNOSIS — E1121 Type 2 diabetes mellitus with diabetic nephropathy: Secondary | ICD-10-CM | POA: Diagnosis not present

## 2016-06-30 DIAGNOSIS — Z888 Allergy status to other drugs, medicaments and biological substances status: Secondary | ICD-10-CM

## 2016-06-30 DIAGNOSIS — Z8261 Family history of arthritis: Secondary | ICD-10-CM

## 2016-06-30 DIAGNOSIS — M1990 Unspecified osteoarthritis, unspecified site: Secondary | ICD-10-CM | POA: Diagnosis present

## 2016-06-30 HISTORY — DX: Pulmonary hypertension, unspecified: I27.20

## 2016-06-30 HISTORY — DX: Chronic atrial fibrillation, unspecified: I48.20

## 2016-06-30 LAB — URINALYSIS, ROUTINE W REFLEX MICROSCOPIC
Bilirubin Urine: NEGATIVE
Glucose, UA: NEGATIVE mg/dL
Ketones, ur: 5 mg/dL — AB
Nitrite: NEGATIVE
PH: 6 (ref 5.0–8.0)
Protein, ur: NEGATIVE mg/dL
SPECIFIC GRAVITY, URINE: 1.009 (ref 1.005–1.030)

## 2016-06-30 LAB — CBC WITH DIFFERENTIAL/PLATELET
BASOS ABS: 0 10*3/uL (ref 0.0–0.1)
Basophils Relative: 0 %
EOS ABS: 0.1 10*3/uL (ref 0.0–0.7)
EOS PCT: 1 %
HCT: 30.6 % — ABNORMAL LOW (ref 36.0–46.0)
Hemoglobin: 10.2 g/dL — ABNORMAL LOW (ref 12.0–15.0)
LYMPHS ABS: 1.6 10*3/uL (ref 0.7–4.0)
LYMPHS PCT: 22 %
MCH: 30.5 pg (ref 26.0–34.0)
MCHC: 33.3 g/dL (ref 30.0–36.0)
MCV: 91.6 fL (ref 78.0–100.0)
MONO ABS: 1 10*3/uL (ref 0.1–1.0)
Monocytes Relative: 13 %
Neutro Abs: 4.7 10*3/uL (ref 1.7–7.7)
Neutrophils Relative %: 64 %
PLATELETS: 211 10*3/uL (ref 150–400)
RBC: 3.34 MIL/uL — ABNORMAL LOW (ref 3.87–5.11)
RDW: 13.5 % (ref 11.5–15.5)
WBC: 7.4 10*3/uL (ref 4.0–10.5)

## 2016-06-30 LAB — SURGICAL PCR SCREEN
MRSA, PCR: NEGATIVE
Staphylococcus aureus: NEGATIVE

## 2016-06-30 LAB — COMPREHENSIVE METABOLIC PANEL
ALBUMIN: 3.8 g/dL (ref 3.5–5.0)
ALT: 9 U/L — ABNORMAL LOW (ref 14–54)
ANION GAP: 8 (ref 5–15)
AST: 16 U/L (ref 15–41)
Alkaline Phosphatase: 61 U/L (ref 38–126)
BILIRUBIN TOTAL: 1.5 mg/dL — AB (ref 0.3–1.2)
BUN: 23 mg/dL — ABNORMAL HIGH (ref 6–20)
CO2: 24 mmol/L (ref 22–32)
Calcium: 9.1 mg/dL (ref 8.9–10.3)
Chloride: 104 mmol/L (ref 101–111)
Creatinine, Ser: 1.5 mg/dL — ABNORMAL HIGH (ref 0.44–1.00)
GFR calc Af Amer: 36 mL/min — ABNORMAL LOW (ref >60)
GFR, EST NON AFRICAN AMERICAN: 31 mL/min — AB (ref >60)
GLUCOSE: 116 mg/dL — AB (ref 65–99)
Potassium: 4 mmol/L (ref 3.5–5.1)
Sodium: 136 mmol/L (ref 135–145)
TOTAL PROTEIN: 7.8 g/dL (ref 6.5–8.1)

## 2016-06-30 LAB — HEPARIN LEVEL (UNFRACTIONATED): HEPARIN UNFRACTIONATED: 1.01 [IU]/mL — AB (ref 0.30–0.70)

## 2016-06-30 LAB — LIPASE, BLOOD: LIPASE: 30 U/L (ref 11–51)

## 2016-06-30 LAB — GLUCOSE, CAPILLARY
GLUCOSE-CAPILLARY: 97 mg/dL (ref 65–99)
Glucose-Capillary: 123 mg/dL — ABNORMAL HIGH (ref 65–99)

## 2016-06-30 LAB — I-STAT BETA HCG BLOOD, ED (MC, WL, AP ONLY): I-stat hCG, quantitative: 5 m[IU]/mL — ABNORMAL HIGH (ref <5)

## 2016-06-30 LAB — APTT: APTT: 36 s (ref 24–36)

## 2016-06-30 MED ORDER — CHLORHEXIDINE GLUCONATE 0.12 % MT SOLN
15.0000 mL | Freq: Two times a day (BID) | OROMUCOSAL | Status: DC
  Administered 2016-06-30 – 2016-07-04 (×4): 15 mL via OROMUCOSAL
  Filled 2016-06-30 (×5): qty 15

## 2016-06-30 MED ORDER — INSULIN ASPART 100 UNIT/ML ~~LOC~~ SOLN: 0.0000 [IU] | Freq: Three times a day (TID) | SUBCUTANEOUS | Status: DC

## 2016-06-30 MED ORDER — ONDANSETRON HCL 4 MG PO TABS: 4.0000 mg | ORAL_TABLET | Freq: Four times a day (QID) | ORAL | Status: DC | PRN

## 2016-06-30 MED ORDER — MORPHINE SULFATE (PF) 4 MG/ML IV SOLN: 2.0000 mg | INTRAVENOUS | Status: DC | PRN

## 2016-06-30 MED ORDER — DIAZEPAM 5 MG/ML IJ SOLN
2.5000 mg | Freq: Three times a day (TID) | INTRAMUSCULAR | Status: DC | PRN
  Administered 2016-06-30 – 2016-07-02 (×2): 2.5 mg via INTRAVENOUS
  Filled 2016-06-30 (×3): qty 2

## 2016-06-30 MED ORDER — LEVOTHYROXINE SODIUM 100 MCG IV SOLR
50.0000 ug | Freq: Every day | INTRAVENOUS | Status: DC
  Administered 2016-06-30 – 2016-07-04 (×5): 50 ug via INTRAVENOUS
  Filled 2016-06-30 (×5): qty 5

## 2016-06-30 MED ORDER — IOPAMIDOL (ISOVUE-300) INJECTION 61%
30.0000 mL | Freq: Once | INTRAVENOUS | Status: AC | PRN
  Administered 2016-06-30: 30 mL via ORAL

## 2016-06-30 MED ORDER — HEPARIN (PORCINE) IN NACL 100-0.45 UNIT/ML-% IJ SOLN
1000.0000 [IU]/h | INTRAMUSCULAR | Status: DC
  Administered 2016-06-30: 1000 [IU]/h via INTRAVENOUS
  Filled 2016-06-30: qty 250

## 2016-06-30 MED ORDER — INSULIN ASPART 100 UNIT/ML ~~LOC~~ SOLN
0.0000 [IU] | SUBCUTANEOUS | Status: DC
  Administered 2016-06-30 – 2016-07-02 (×4): 1 [IU] via SUBCUTANEOUS
  Administered 2016-07-02 (×2): 2 [IU] via SUBCUTANEOUS
  Administered 2016-07-02: 1 [IU] via SUBCUTANEOUS
  Administered 2016-07-02: 2 [IU] via SUBCUTANEOUS
  Administered 2016-07-03 (×2): 1 [IU] via SUBCUTANEOUS

## 2016-06-30 MED ORDER — HYDRALAZINE HCL 20 MG/ML IJ SOLN
10.0000 mg | INTRAMUSCULAR | Status: DC | PRN
  Administered 2016-06-30 – 2016-07-04 (×4): 10 mg via INTRAVENOUS
  Filled 2016-06-30 (×4): qty 1

## 2016-06-30 MED ORDER — METOPROLOL TARTRATE 5 MG/5ML IV SOLN
5.0000 mg | Freq: Four times a day (QID) | INTRAVENOUS | Status: DC
  Administered 2016-06-30 – 2016-07-02 (×8): 5 mg via INTRAVENOUS
  Filled 2016-06-30 (×8): qty 5

## 2016-06-30 MED ORDER — POTASSIUM CHLORIDE IN NACL 20-0.9 MEQ/L-% IV SOLN
INTRAVENOUS | Status: DC
  Administered 2016-06-30 – 2016-07-03 (×6): via INTRAVENOUS
  Filled 2016-06-30 (×7): qty 1000

## 2016-06-30 MED ORDER — PROCHLORPERAZINE EDISYLATE 5 MG/ML IJ SOLN
5.0000 mg | Freq: Four times a day (QID) | INTRAMUSCULAR | Status: DC | PRN
  Administered 2016-06-30: 5 mg via INTRAVENOUS
  Filled 2016-06-30: qty 2

## 2016-06-30 MED ORDER — ACETAMINOPHEN 650 MG RE SUPP: 650.0000 mg | Freq: Four times a day (QID) | RECTAL | Status: DC | PRN

## 2016-06-30 MED ORDER — ACETAMINOPHEN 325 MG PO TABS: 650.0000 mg | ORAL_TABLET | Freq: Four times a day (QID) | ORAL | Status: DC | PRN

## 2016-06-30 MED ORDER — ONDANSETRON HCL 4 MG/2ML IJ SOLN
4.0000 mg | Freq: Once | INTRAMUSCULAR | Status: AC
  Administered 2016-06-30: 4 mg via INTRAVENOUS
  Filled 2016-06-30: qty 2

## 2016-06-30 MED ORDER — ONDANSETRON HCL 4 MG/2ML IJ SOLN
4.0000 mg | Freq: Four times a day (QID) | INTRAMUSCULAR | Status: DC | PRN
  Administered 2016-06-30: 4 mg via INTRAVENOUS
  Filled 2016-06-30: qty 2

## 2016-06-30 MED ORDER — SODIUM CHLORIDE 0.9 % IV SOLN
INTRAVENOUS | Status: DC
  Administered 2016-06-30: 12:00:00 via INTRAVENOUS

## 2016-06-30 MED ORDER — IOPAMIDOL (ISOVUE-300) INJECTION 61%
INTRAVENOUS | Status: AC
  Filled 2016-06-30: qty 30

## 2016-06-30 MED ORDER — SODIUM CHLORIDE 0.9 % IV BOLUS (SEPSIS)
1000.0000 mL | Freq: Once | INTRAVENOUS | Status: AC
  Administered 2016-06-30: 1000 mL via INTRAVENOUS

## 2016-06-30 MED ORDER — METOPROLOL TARTRATE 5 MG/5ML IV SOLN: 5.0000 mg | Freq: Four times a day (QID) | INTRAVENOUS | Status: DC

## 2016-06-30 NOTE — Progress Notes (Signed)
ANTICOAGULATION CONSULT NOTE - Initial Consult  Pharmacy Consult for Heparin Indication: atrial fibrillation  Allergies  Allergen Reactions  . Codeine Sulfate Other (See Comments)    REACTION: GI upset  . Sulfonamide Derivatives Other (See Comments)    REACTION: GI upset  . Vancomycin Other (See Comments)    Red man syndrome  . Atarax [Hydroxyzine] Nausea Only and Rash    Patient Measurements: Height: 5' 6" (167.6 cm) Weight: 160 lb (72.6 kg) IBW/kg (Calculated) : 59.3 Heparin Dosing Weight: 63 kg  Vital Signs: Temp: 97.6 F (36.4 C) (03/26 1352) Temp Source: Oral (03/26 1352) BP: 193/85 (03/26 1650) Pulse Rate: 73 (03/26 1650)  Labs:  Recent Labs  06/30/16 1129  HGB 10.2*  HCT 30.6*  PLT 211  CREATININE 1.50*    Estimated Creatinine Clearance: 30 mL/min (A) (by C-G formula based on SCr of 1.5 mg/dL (H)).   Medical History: Past Medical History:  Diagnosis Date  . Arthritis    r tkr  . Breast cancer ([place])   . Chronic atrial fibrillation ([facility])    CHADS2-VASc=6.  on low dose Eliquis (corrected for age & renal fxn)  . CVA (cerebral vascular accident) ([place]) 2008   mini stroke.no residual  . DEPRESSION 05/16/2009  . Diabetes mellitus type II   . Heart murmur    stress test 2009.dr peter Martinique  . HYPERLIPIDEMIA 07/24/2008  . HYPERTENSION 07/24/2008  . HYPOTHYROIDISM 07/24/2008  . Intermittent vertigo   . Pneumonia   . Pulmonary hypertension    Mod PHTN on Echo 03/2016: 2/2 combination of Wegener's, HFPF & Afib.  . Skin abnormality    facial lesions .pt applying mupiracin to areas  . Vertigo   . Wegener's disease, pulmonary ([place]) 10/2012    Medications:  Scheduled:  . insulin aspart  0-9 Units Subcutaneous Q4H  . iopamidol      . levothyroxine  50 mcg Intravenous Daily  . metoprolol  5 mg Intravenous Q6H   Infusions:  . 0.9 % NaCl with KCl 20 mEq / L    . heparin      Assessment:  83 yr female presents with c/o nausea/vomiting and abdominal pain  from her hernia.  PMH significant for AFib (on Eliquis PTA), DM, HTN, hypothyroidism, CKD Stage 3  Pharmacy consulted to dose IV heparin for AFib in anticipation of upcoming surgical repair of hernia  PTA Eliquis 2.37m po BID with last dose 3/25   Goal of Therapy:  Heparin level 0.3-0.7 units/ml  APTT goal: 66-102 sec Monitor platelets by anticoagulation protocol: Yes   Plan:   Obtain baseline aPTT and Heparin level  Begin IV heparin @ 1000 units/hr (no bolus)  Will check aPTT and HL 8 hr after heparin started (will continue to monitor heparin with aPTT until aPTT & HL correlate as effects of Eliquis wear off, as Eliquis can falsely elevate HL).  Follow CBC and heparin level daily  Okechukwu Regnier, LToribio Harbour PharmD 06/30/2016,5:26 PM

## 2016-06-30 NOTE — ED Provider Notes (Signed)
[name] DEPT Provider Note   CSN: [id number] Arrival date & time: 06/30/16  1039     History   Chief Complaint Chief Complaint  Patient presents with  . Hernia    HPI [name] [name] is a 83 y.o. female.  HPI Pt has been having trouble with nausea for the last couple of days whenever she tries to eat anything or drink anything.  Yesterday she was able to keep down some crackers and gingerale.  She has not vomited since but she has not tried to eat or drink anything.  She developed a ventral hernia years ago.   Normally it is small in size but when this started with the vomiting the hernia increased.  It is tender , hard to the touch and when she tried to eat over the last couple of days the pain would be sharp.  Last BM was on Friday. She called her doctor and was told to come to the ED. Past Medical History:  Diagnosis Date  . Arthritis    r tkr  . Atrial fibrillation ([place])   . Breast cancer ([place])   . CVA (cerebral vascular accident) ([place]) 2008   mini stroke.no residual  . DEPRESSION 05/16/2009  . Diabetes mellitus type II   . Heart murmur    stress test 2009.dr peter Martinique  . HYPERLIPIDEMIA 07/24/2008  . HYPERTENSION 07/24/2008  . HYPOTHYROIDISM 07/24/2008  . Intermittent vertigo   . Pneumonia   . Skin abnormality    facial lesions .pt applying mupiracin to areas  . Vertigo   . Wegener's disease, pulmonary ([place]) 10/2012    Patient Active Problem List   Diagnosis Date Noted  . CAP (community acquired pneumonia) 04/03/2016  . Lobar pneumonia ([place]) 04/02/2016  . Cellulitis and abscess   . Diabetic foot ulcer ([place]) 12/10/2015  . Hereditary and idiopathic peripheral neuropathy 09/25/2014  . Dependent edema 09/25/2014  . UTI (lower urinary tract infection)   . Type 2 diabetes mellitus, controlled ([place]) 07/19/2014  . Acute on chronic respiratory failure ([place]) 04/04/2014  . Chronic atrial fibrillation ([place]) 04/04/2014  . CKD (chronic kidney disease) stage 3, GFR 30-59  ml/min 04/04/2014  . Abdominal hernia without obstruction or gangrene 01/09/2014  . Nausea alone 08/21/2013  . Diastolic CHF, chronic ([place]) 08/18/2013  . COPD (chronic obstructive pulmonary disease) ([place]) 08/18/2013  . Sepsis ([place]) 08/18/2013  . Hand pain, left 08/18/2013  . COPD with acute exacerbation ([place]) 05/10/2013  . Chronic kidney disease (CKD), stage IV (severe) ([place]) 05/08/2013  . Normocytic anemia 05/08/2013  . Pulmonary hypertension 05/07/2013  . Right-sided heart failure 05/07/2013  . Pancreatic cyst 04/17/2013  . Obesity (BMI 30-39.9) 12/28/2012  . Wegener's granulomatosis (granulomatosis with polyangiitis) ([place]) 11/02/2012  . Abnormal echocardiogram 11/02/2012  . Cough with hemoptysis 11/01/2012  . Anemia 11/01/2012  . Dehydration 11/01/2012  . Diffuse pulmonary alveolar hemorrhage 11/01/2012  . Breast cancer ([place]) 09/14/2012  . Pruritus of skin 05/20/2012  . Carcinoid tumor of colon, malignant ([place]) 05/16/2011  . Cryptogenic organizing pneumonia 04/05/2011  . Vertigo, intermittent 10/21/2010  . Type 2 diabetes mellitus ([place]) 06/04/2010  . Edema 03/25/2010  . Hyponatremia 02/13/2010  . DEPRESSION 05/16/2009  . Hypothyroidism 07/24/2008  . Hyperlipidemia 07/24/2008  . Essential hypertension 07/24/2008  . OSTEOARTHRITIS 07/24/2008    Past Surgical History:  Procedure Laterality Date  . BREAST SURGERY  2007   Lumpectomy, XRT 2006.l breast  . CHOLECYSTECTOMY  1980  . EXPLORATORY LAPAROTOMY    . HEEL SPUR  SURGERY Right   . JOINT REPLACEMENT     r knee  . KNEE SURGERY  2009   TKR  . VIDEO ASSISTED THORACOSCOPY  04/22/2011   Procedure: VIDEO ASSISTED THORACOSCOPY;  Surgeon: Melrose Nakayama, MD;  Location: [facility];  Service: Thoracic;  Laterality: Left;  WITH BIOPSY    OB History    No data available       Home Medications    Prior to Admission medications   Medication Sig Start Date End Date Taking? Authorizing Provider  amLODipine (NORVASC) 5 MG  tablet TAKE ONE TABLET BY MOUTH ONCE DAILY. 05/14/16   [name], MD  BYSTOLIC 10 MG tablet TAKE ONE TABLET BY MOUTH ONCE DAILY AFTER  BREAKFAST. Patient taking differently: TAKE 69m TABLET BY MOUTH ONCE DAILY AFTER  BREAKFAST. 11/26/15   BEulas Post MD  CALCIUM CITRATE PO Take 1,200 mg by mouth daily with lunch.     Historical Provider, MD  cholecalciferol (VITAMIN D) 1000 UNITS tablet Take 3,000 Units by mouth daily with lunch.     Historical Provider, MD  diazepam (VALIUM) 5 MG tablet Take one tablet by mouth as needed for severe vertigo flares Patient taking differently: Take 5 mg by mouth daily as needed (severe vertigo..Marland Kitchen.  11/15/15   BEulas Post MD  ELIQUIS 2.5 MG TABS tablet TAKE ONE TABLET BY MOUTH TWICE DAILY Patient taking differently: TAKE 2.572mTABLET BY MOUTH TWICE DAILY 02/25/16   Peter M JoMartiniqueMD  escitalopram (LEXAPRO) 10 MG tablet Take 1 tablet (10 mg total) by mouth daily. 11/30/15   BrEulas PostMD  furosemide (LASIX) 20 MG tablet Take 1 tablet (20 mg total) by mouth daily. 04/04/16   GaOswald HillockMD  gabapentin (NEURONTIN) 100 MG capsule TAKE ONE CAPSULE BY MOUTH TWICE DAILY BEFORE A  MEAL Patient taking differently: TAKE 10027mAPSULE BY MOUTH TWICE DAILY BEFORE A  MEAL 01/14/16   BruEulas PostD  glimepiride (AMARYL) 2 MG tablet Take 1 tablet (2 mg total) by mouth daily with breakfast. TAKE ONE TABLET BY MOUTH ONCE DAILY BEFORE BREAKFAST 01/29/16   BruEulas PostD  guaiFENesin (MUCINEX) 600 MG 12 hr tablet Take 600 mg by mouth 2 (two) times daily as needed for cough or to loosen phlegm.    Historical Provider, MD  levothyroxine (SYNTHROID, LEVOTHROID) 100 MCG tablet Take 1 tablet (100 mcg total) by mouth daily before breakfast. 12/04/15   BruEulas PostD  loratadine (CLARITIN) 10 MG tablet Take 10 mg by mouth daily as needed for allergies.    Historical Provider, MD  predniSONE (DELTASONE) 5 MG tablet Take 1 tablet (5 mg total) by  mouth daily with breakfast. Patient taking differently: Take 5 mg by mouth every other day.  07/18/15   CliDeneise LeverD  triamcinolone cream (KENALOG) 0.1 % Apply 1 application topically 2 (two) times daily as needed. 05/15/15   BruEulas PostD    Family History Family History  Problem Relation Age of Onset  . Arthritis Mother   . Rheum arthritis Mother   . Heart disease Father   . Coronary artery disease Father   . Cancer Sister     breast CA, both sisters  . Breast cancer Sister   . Coronary artery disease Brother   . Lung cancer Brother     Social History Social History  Substance Use Topics  . Smoking status: Former Smoker    Packs/day: 0.50  Years: 12.00    Types: Cigarettes    Quit date: 06/04/1982  . Smokeless tobacco: Never Used  . Alcohol use No     Allergies   Codeine sulfate; Sulfonamide derivatives; Vancomycin; and Atarax [hydroxyzine]   Review of Systems Review of Systems  All other systems reviewed and are negative.    Physical Exam Updated Vital Signs BP (!) 170/68 (BP Location: Right Arm)   Pulse 75   Temp 97.6 F (36.4 C) (Oral)   Resp 18   Ht 5' 6" (1.676 m)   Wt 72.6 kg   SpO2 96%   BMI 25.82 kg/m   Physical Exam  Constitutional: No distress.  HENT:  Head: Normocephalic and atraumatic.  Right Ear: External ear normal.  Left Ear: External ear normal.  Eyes: Conjunctivae are normal. Right eye exhibits no discharge. Left eye exhibits no discharge. No scleral icterus.  Neck: Neck supple. No tracheal deviation present.  Cardiovascular: Normal rate, regular rhythm and intact distal pulses.   Pulmonary/Chest: Effort normal and breath sounds normal. No stridor. No respiratory distress. She has no wheezes. She has no rales.  Abdominal: Soft. Bowel sounds are normal. She exhibits mass. She exhibits no distension. There is tenderness in the periumbilical area. There is no rebound and no guarding. A hernia is present. Hernia confirmed  positive in the ventral area.  Incarcerated ventral hernia, ttp, surrounding erythema  Musculoskeletal: She exhibits no edema or tenderness.  Neurological: She is alert. She has normal strength. No cranial nerve deficit (no facial droop, extraocular movements intact, no slurred speech) or sensory deficit. She exhibits normal muscle tone. She displays no seizure activity. Coordination normal.  Skin: Skin is warm and dry. No rash noted. She is not diaphoretic.  Psychiatric: She has a normal mood and affect.  Nursing note and vitals reviewed.    ED Treatments / Results  Labs (all labs ordered are listed, but only abnormal results are displayed) Labs Reviewed - No data to display  EKG  EKG Interpretation None       Radiology No results found.  Procedures Procedures (including critical care time)  Medications Ordered in ED Medications - No data to display   Initial Impression / Assessment and Plan / ED Course  I have reviewed the triage vital signs and the nursing notes.  Pertinent labs & imaging results that were available during my care of the patient were reviewed by me and considered in my medical decision making (see chart for details).  Clinical Course as of Jul 02 1302  Mon Jun 30, 2016  1227 HCG accidentally ordered.  [XB]  8478 Discussed with general surgery.  Will consult on patient.  Will likely need medical admission  [JK]  1227 RDW: 13.5 [JK]  1533 CT scans resulted.  Pt does have signs of obstruction.  Will need surgery but will need medical eval and clearance first.  Cardiology consulted by Gen surg per Will Creig Hines.  I will consult with medical service for admission.  [JK]    Clinical Course User Index [JK] Dorie Rank, MD   Pt admitted to the medical service in stable condition for an incarcerated hernia.  Gen surg consulted and will plan on surgical intervention.  Medical admission requested based on her comorbidities.   Final Clinical Impressions(s) / ED  Diagnoses   Final diagnoses:  Incarcerated hernia      Dorie Rank, MD 07/01/16 [phone]

## 2016-06-30 NOTE — Consult Note (Signed)
Reason for Consult: Ventral Hernia Referring Physician:  Hillard Danker PCP:  [name], MD CARDIOLOGY:  Driscilla Moats Pulmonary:  Dr. Baird Lyons   CC: Patient reports her ventral hernia starting getting bigger about 3 weeks ago. Over the last week has become hard, she's had issues with nausea and vomiting with by mouth intake, has become progressively more painful. Last BM 06/27/16.  [name] [name] is an 83 y.o. female.   HPI: Followed by Dr. Lucia Gaskins in the past, about 6 months ago a with Ventral hernia. He recommended no surgery if possible.  She called Dr. Elease Hashimoto today with abdominal pain, unable to eat and sent to the ED.  On presentation to the ED she reports ongoing pain, Ventral hernia is tender,  no nausea or vomiting since yesterday, but has not tried to eat or drink since some crackers yesterday.  Last BM was on Friday.  Work up currently shows Creatinine is 1.5, T bil is up some at 1.5, WBC is normal, H/H 10.2/30.6, platelets are normal. Acute abdominal with chest shows lungs are clear all bilateral rib fractures on the left. Scattered large and small bowel gas is noted. Some air-fluid levels in the mid abdomen no colon dilatation general changes of the spine. Urinalysis shows 6-30 WBC but negative on the nitrates. A CT scan has been ordered. The reading is not back yet, but she does appear to have bowel within the hernia sac. On exam I was not able to reduce it. She had her last doses Xarelto last evening 06/29/16 at 10 PM.  We are asked to see.  Past Medical History:  Diagnosis Date  . Arthritis    r tkr  . Atrial fibrillation ([place])   . Breast cancer ([place])   . CVA (cerebral vascular accident) ([place]) 2008   mini stroke.no residual  . DEPRESSION 05/16/2009  . Diabetes mellitus type II   . Heart murmur    stress test 2009.dr peter [place]  . HYPERLIPIDEMIA 07/24/2008  . HYPERTENSION 07/24/2008  . HYPOTHYROIDISM 07/24/2008  . Intermittent vertigo   . Pneumonia   . Skin  abnormality    facial lesions .pt applying mupiracin to areas  . Vertigo   . Wegener's disease, pulmonary ([place]) 10/2012    Past Surgical History:  Procedure Laterality Date  . BREAST SURGERY  2007   Lumpectomy, XRT 2006.l breast  . CHOLECYSTECTOMY  1980  . EXPLORATORY LAPAROTOMY    . HEEL SPUR SURGERY Right   . JOINT REPLACEMENT     r knee  . KNEE SURGERY  2009   TKR  . VIDEO ASSISTED THORACOSCOPY  04/22/2011   Procedure: VIDEO ASSISTED THORACOSCOPY;  Surgeon: Melrose Nakayama, MD;  Location: [facility] OR;  Service: Thoracic;  Laterality: Left;  WITH BIOPSY    Family History  Problem Relation Age of Onset  . Arthritis Mother   . Rheum arthritis Mother   . Heart disease Father   . Coronary artery disease Father   . Cancer Sister     breast CA, both sisters  . Breast cancer Sister   . Coronary artery disease Brother   . Lung cancer Brother     Social History:  reports that she quit smoking about 34 years ago. Her smoking use included Cigarettes. She has a 6.00 pack-year smoking history. She has never used smokeless tobacco. She reports that she does not drink alcohol or use drugs.  Allergies:  Allergies  Allergen Reactions  . Codeine Sulfate Other (See Comments)  REACTION: GI upset  . Sulfonamide Derivatives Other (See Comments)    REACTION: GI upset  . Vancomycin Other (See Comments)    Red man syndrome  . Atarax [Hydroxyzine] Nausea Only and Rash    Prior to Admission medications   Medication  HOME O@ at 2l/[place] 04/16/16 Sig Start Date End Date Taking? Authorizing Provider  amLODipine (NORVASC) 5 MG tablet TAKE ONE TABLET BY MOUTH ONCE DAILY. 05/14/16   [name], MD  BYSTOLIC 10 MG tablet TAKE ONE TABLET BY MOUTH ONCE DAILY AFTER  BREAKFAST. Patient taking differently: TAKE 39m TABLET BY MOUTH ONCE DAILY AFTER  BREAKFAST. 11/26/15   BEulas Post MD  CALCIUM CITRATE PO Take 1,200 mg by mouth daily with lunch.     Historical Provider, MD  cholecalciferol  (VITAMIN D) 1000 UNITS tablet Take 3,000 Units by mouth daily with lunch.     Historical Provider, MD  diazepam (VALIUM) 5 MG tablet Take one tablet by mouth as needed for severe vertigo flares Patient taking differently: Take 5 mg by mouth daily as needed (severe vertigo..Marland Kitchen.  11/15/15   BEulas Post MD  ELIQUIS 2.5 MG TABS tablet TAKE ONE TABLET BY MOUTH TWICE DAILY Patient taking differently: TAKE 2.579mTABLET BY MOUTH TWICE DAILY 02/25/16   Peter M JoMartiniqueMD  escitalopram (LEXAPRO) 10 MG tablet Take 1 tablet (10 mg total) by mouth daily. 11/30/15   BrEulas PostMD  furosemide (LASIX) 20 MG tablet Take 1 tablet (20 mg total) by mouth daily. 04/04/16   GaOswald HillockMD  gabapentin (NEURONTIN) 100 MG capsule TAKE ONE CAPSULE BY MOUTH TWICE DAILY BEFORE A  MEAL Patient taking differently: TAKE 10036mAPSULE BY MOUTH TWICE DAILY BEFORE A  MEAL 01/14/16   BruEulas PostD  glimepiride (AMARYL) 2 MG tablet Take 1 tablet (2 mg total) by mouth daily with breakfast. TAKE ONE TABLET BY MOUTH ONCE DAILY BEFORE BREAKFAST 01/29/16   BruEulas PostD  guaiFENesin (MUCINEX) 600 MG 12 hr tablet Take 600 mg by mouth 2 (two) times daily as needed for cough or to loosen phlegm.    Historical Provider, MD  levothyroxine (SYNTHROID, LEVOTHROID) 100 MCG tablet Take 1 tablet (100 mcg total) by mouth daily before breakfast. 12/04/15   BruEulas PostD  loratadine (CLARITIN) 10 MG tablet Take 10 mg by mouth daily as needed for allergies.    Historical Provider, MD  predniSONE (DELTASONE) 5 MG tablet Take 1 tablet (5 mg total) by mouth daily with breakfast. Patient taking differently: Take 5 mg by mouth every other day.  07/18/15   CliDeneise LeverD  triamcinolone cream (KENALOG) 0.1 % Apply 1 application topically 2 (two) times daily as needed. 05/15/15   BruEulas PostD     Results for orders placed or performed during the hospital encounter of 06/30/16 (from the past 48 hour(s))  CBC WITH  DIFFERENTIAL     Status: Abnormal   Collection Time: 06/30/16 11:29 AM  Result Value Ref Range   WBC 7.4 4.0 - 10.5 K/uL   RBC 3.34 (L) 3.87 - 5.11 MIL/uL   Hemoglobin 10.2 (L) 12.0 - 15.0 g/dL   HCT 30.6 (L) 36.0 - 46.0 %   MCV 91.6 78.0 - 100.0 fL   MCH 30.5 26.0 - 34.0 pg   MCHC 33.3 30.0 - 36.0 g/dL   RDW 13.5 11.5 - 15.5 %   Platelets 211 150 - 400 K/uL   Neutrophils Relative %  64 %   Neutro Abs 4.7 1.7 - 7.7 K/uL   Lymphocytes Relative 22 %   Lymphs Abs 1.6 0.7 - 4.0 K/uL   Monocytes Relative 13 %   Monocytes Absolute 1.0 0.1 - 1.0 K/uL   Eosinophils Relative 1 %   Eosinophils Absolute 0.1 0.0 - 0.7 K/uL   Basophils Relative 0 %   Basophils Absolute 0.0 0.0 - 0.1 K/uL  I-Stat beta hCG blood, ED ([facility], [facility], [facility] only)     Status: Abnormal   Collection Time: 06/30/16 11:40 AM  Result Value Ref Range   I-stat hCG, quantitative 5.0 (H) <5 mIU/mL   Comment 3            Comment:   GEST. AGE      CONC.  (mIU/mL)   <=1 WEEK        5 - 50     2 WEEKS       50 - 500     3 WEEKS       100 - 10,000     4 WEEKS     1,000 - 30,000        FEMALE AND NON-PREGNANT FEMALE:     LESS THAN 5 mIU/mL     Dg Abd Acute W/chest  Result Date: 06/30/2016 CLINICAL DATA:  Increase in hernia size over the last few days with increasing abdominal pain, initial encounter EXAM: DG ABDOMEN ACUTE W/ 1V CHEST COMPARISON:  06/04/2015 FINDINGS: Cardiac shadow remains enlarged. Aortic calcifications are again seen. The lungs are clear bilaterally. Old rib fractures are noted on the left. Scattered large and small bowel gas is noted. Some air-fluid levels are noted in the right mid abdomen. Given the patient's clinical history this may represent some very early partial small bowel obstruction. No colonic dilatation is seen. Degenerative changes of the lumbar spine are noted. IMPRESSION: Few dilated loops of small bowel with air-fluid levels. This may represent an early partial small bowel obstruction given the clinical  history. CT imaging may be helpful for further evaluation. Electronically Signed   By: Inez Catalina M.D.   On: 06/30/2016 11:55    Review of Systems  Constitutional: Positive for weight loss (40 pounds with change in her diet over the last 4 months.). Negative for chills, diaphoresis, fever and malaise/fatigue.  HENT: Negative.   Eyes: Negative.   Respiratory: Positive for shortness of breath. Negative for cough, hemoptysis, sputum production and wheezing.        She wears oxygen at night chronically for her COPD. She is confined to wheelchair for shopping she says she can't walk now secondary to shortness of breath. She reports she is on chronic prednisone for her COPD.  Cardiovascular: Negative.   Gastrointestinal: Positive for abdominal pain, constipation (No BM since 06/27/16), nausea and vomiting. Negative for blood in stool, diarrhea, heartburn and melena.       Last colonoscopy was about 10 years ago.  Genitourinary: Negative.   Musculoskeletal: Negative.   Skin: Negative.   Neurological: Negative.  Negative for weakness.  Endo/Heme/Allergies: Positive for environmental allergies. Negative for polydipsia. Bruises/bleeds easily (She is on Eliquis, last dose was yesterday around 10 PM).  Psychiatric/Behavioral: Positive for depression.   Blood pressure (!) 170/68, pulse 75, temperature 97.6 F (36.4 C), temperature source Oral, resp. rate 18, height _0  (1.676 m), weight 72.6 kg (160 lb), SpO2 96 %. Physical Exam  Constitutional: She is oriented to person, place, and time. She appears well-developed and well-nourished.  No distress.  HENT:  Head: Normocephalic and atraumatic.  Mouth/Throat: No oropharyngeal exudate.  Eyes: Right eye exhibits no discharge. Left eye exhibits no discharge. No scleral icterus.  Neck: Normal range of motion. Neck supple. No JVD present. No tracheal deviation present. No thyromegaly present.  Cardiovascular: Normal rate, regular rhythm and intact distal  pulses.   Murmur heard. Irregular rhythm Mild diastolic murmurs.  Respiratory: Effort normal. No respiratory distress. She has no wheezes. She has no rales. She exhibits no tenderness.  GI: Soft. She exhibits mass. She exhibits no distension. There is tenderness. There is no rebound and no guarding.  Decreased bowel sounds. She had a umbilical hernia that was a by 5 cm. It was slightly erythematous. It was hard and I could not reduce it. It was painful to palpation and with my attempts to reduce it.  Musculoskeletal: She exhibits no edema or tenderness.  Lymphadenopathy:    She has no cervical adenopathy.  Neurological: She is alert and oriented to person, place, and time. No cranial nerve deficit.  Skin: Skin is warm and dry. No rash noted. She is not diaphoretic. No erythema. No pallor.  Psychiatric: She has a normal mood and affect. Her behavior is normal. Judgment and thought content normal.    Assessment/Plan: Umbilical hernia with incarceration. History exploratory laparotomy and cholecystectomy, 1980s Dr. Lindon Romp History Wegener's disease COPD/on steroids and home O2 Atrial fibrillation on chronic anticoagulation  -  Eliquis last dose 06/29/16  10 PM History CVA Type II Diabetes Hx of left breast cancer with lobectomy and radiation therapy Hypertension Hypothyroidism History of depression  Plan:  Medicine to admit. I would keep her NPO for now. Hold the Eliquis and start heparin as needed. NG tube if she has further nausea or vomiting. I have called cardiology to see. I think she will go the OR in the next 24-48 hours. If it were not for the Eliquis she would most likely go this evening. Dr. Barry Dienes is aware and will see her shortly.     Jamieka Royle 06/30/2016, 12:00 PM

## 2016-06-30 NOTE — ED Triage Notes (Signed)
Patient reports hernia for years. Patient has been seen a Psychologist, sport and exercise and was told her would not operate unless it got worse. Patient reports increase in hernia size over the past week. Reports no bowel movement in 2 days; also unable to keep any food or fluids down in 2 days.

## 2016-06-30 NOTE — Consult Note (Signed)
Cardiology Consult    Patient ID: [name] [name] MRN: [id number], DOB/AGE: 1933-02-09   Admit date: 06/30/2016 Date of Consult: 06/30/2016  Primary Physician: [name], MD Primary Cardiologist: Dr. Martinique Requesting Provider: [name], MD  Patient Profile    [name] [name] is a 83 y.o. female with a hx of HTN, DM2, prior TIA, HL, breast CA, atrial fibrillation.  She is on 2.5 mg bid Eliquis (CHADS2-VASc=6) for anticoagulation. She has a  dx of granulomatosis with polyangiitis (Wegener's).   Echo 04/14/13  EF 60-65%, trivial AI, mild-mod MR, mod to severe LAE, mild RVE, mod RAE, moderate TR,  PASP- 60 mm Hg.    She last saw Dr. Martinique in March 2017 - was stable.  She is being admitted for recurrent umbilical hernia with incarceration.  Cardiology is consulted for Pre-op eval.  Past Medical History   Past Medical History:  Diagnosis Date  . Arthritis    r tkr  . Breast cancer ([place])   . Chronic atrial fibrillation (HCC)    CHADS2-VASc=6.  on low dose Eliquis (corrected for age & renal fxn)  . CVA (cerebral vascular accident) ([place]) 2008   mini stroke.no residual  . DEPRESSION 05/16/2009  . Diabetes mellitus type II   . Heart murmur    stress test 2009.dr peter Martinique  . HYPERLIPIDEMIA 07/24/2008  . HYPERTENSION 07/24/2008  . HYPOTHYROIDISM 07/24/2008  . Intermittent vertigo   . Pneumonia   . Pulmonary hypertension    Mod PHTN on Echo 03/2016: 2/2 combination of Wegener's, HFPF & Afib.  . Skin abnormality    facial lesions .pt applying mupiracin to areas  . Vertigo   . 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 BREAST SURGERY  2007   Lumpectomy, XRT 2006.l breast  . CHOLECYSTECTOMY  1980  . EXPLORATORY LAPAROTOMY    . HEEL SPUR SURGERY Right   . JOINT REPLACEMENT     r knee  . KNEE SURGERY  2009   TKR  . TRANSTHORACIC ECHOCARDIOGRAM  03/2016   Afib (no Diastolic Fxn). EF 55-60%. No RWMA. Mild Aortic  dilation Mild MR. Severe LA dilation. Mod RA dilation.  Mod TR with Mod-Severe Pulmonary HTN - but normal RV function..  . VIDEO ASSISTED THORACOSCOPY  04/22/2011   Procedure: VIDEO ASSISTED THORACOSCOPY;  Surgeon: Melrose Nakayama, MD;  Location: [facility];  Service: Thoracic;  Laterality: Left;  WITH BIOPSY     Allergies  Allergies  Allergen Reactions  . Codeine Sulfate Other (See Comments)    REACTION: GI upset  . Sulfonamide Derivatives Other (See Comments)    REACTION: GI upset  . Vancomycin Other (See Comments)    Red man syndrome  . Atarax [Hydroxyzine] Nausea Only and Rash    History of Present Illness    [name] [name] has a long-standing h/o ventral hernia that she notes has been getting larger over the last few weeks.  It has now become hard & is associated N/V & increased Pain.  Last BM 3/23.  She has been seen by Surgery team with concern for incarcerated hernia -- no PO in ~3 days due to N/V & pain.This includes medications.  She has been completely asymptomatic from a cardiac standpoint since her last cardiology visit. She has not had any suggestion of untoward symptoms from her atrial fibrillation - no rapid irregular sensations. She denies any PND, orthopnea or edema. No chest tightness pressure with rest or  exertion. No syncope, near syncope or TIA symptoms /amaurosis fugax.  No bleeding issues on ELIQUIS.   Inpatient Medications    . iopamidol        Family History    Family History  Problem Relation Age of Onset  . Arthritis Mother   . Rheum arthritis Mother   . Heart disease Father   . Coronary artery disease Father   . Cancer Sister     breast CA, both sisters  . Breast cancer Sister   . Coronary artery disease Brother   . Lung cancer Brother     Social History    Social History   Social History  . Marital status: Married    Spouse name: N/A  . Number of children: N/A  . Years of education: N/A   Occupational History  . Not on file.    Social History Main Topics  . Smoking status: Former Smoker    Packs/day: 0.50    Years: 12.00    Types: Cigarettes    Quit date: 06/04/1982  . Smokeless tobacco: Never Used  . Alcohol use No  . Drug use: No  . Sexual activity: Yes    Birth control/ protection: Post-menopausal   Other Topics Concern  . Not on file   Social History Narrative   Lives with spouse who now has alz x 49 years   3 sons      Grand children; 7; 4 grandson and 3 granddtr   [employer]; 5 great grandson and 3 great granddtr   The youngest is 69 months      Review of Systems    ROS  All other systems reviewed and are otherwise negative except as noted above.  Physical Exam    Blood pressure (!) 181/83, pulse 76, temperature 97.6 F (36.4 C), temperature source Oral, resp. rate 17, height _0  (1.676 m), weight 72.6 kg (160 lb), SpO2 99 %.  General: Pleasant,Mildly uncomfortable because of abdominal pain Psych: Normal affect. Neuro: Alert and oriented X 3. Moves all extremities spontaneously. HEENT: [name] [name]/AT, EOMI, MMM, anicteric sclerae  Neck: Supple without bruits or JVD. Lungs:  Resp regular and unlabored, CTAB. Heart: Irreg -Irreg.  Normal S1 & S2.  + 3/6 SEM @ RUSB. Abdomen: Nondistended but tender to palpation. Decreased bowel sounds throughout. Umbilical hernia is in place. I did not press hard because she just had significant pain from the surgeons evaluation. Extremities: No clubbing, cyanosis or edema. DP/PT/Radials 2+ and equal bilaterally.  Labs    Troponin (Point of Care Test) No results for input(s): TROPIPOC in the last 72 hours. No results for input(s): CKTOTAL, CKMB, TROPONINI in the last 72 hours. Lab Results  Component Value Date   WBC 7.4 06/30/2016   HGB 10.2 (L) 06/30/2016   HCT 30.6 (L) 06/30/2016   MCV 91.6 06/30/2016   PLT 211 06/30/2016    Recent Labs Lab 06/30/16 1129  NA 136  K 4.0  CL 104  CO2 24  BUN 23*  CREATININE 1.50*  CALCIUM 9.1  PROT 7.8   BILITOT 1.5*  ALKPHOS 61  ALT 9*  AST 16  GLUCOSE 116*   Lab Results  Component Value Date   CHOL 132 11/30/2015   HDL 60.00 11/30/2015   LDLCALC 52 11/30/2015   TRIG 100.0 11/30/2015   Lab Results  Component Value Date   DDIMER 1.33 (H) 09/25/2014     Radiology Studies    Ct Abdomen Pelvis Wo Contrast  Result Date: 06/30/2016  CLINICAL DATA:  Increasing size of abdominal wall hernia.  Vomiting. EXAM: CT ABDOMEN AND PELVIS WITHOUT CONTRAST TECHNIQUE: Multidetector CT imaging of the abdomen and pelvis was performed following the standard protocol without IV contrast. COMPARISON:  MRI abdomen 04/18/2013.  PET-CT 03/19/2012 FINDINGS: Lower chest: Basilar scarring changes without pleural effusion or focal pulmonary lesions. Cardiac enlargement with extensive coronary artery calcifications. Small hiatal hernia. Hepatobiliary: No focal hepatic lesions or intrahepatic biliary dilatation. The gallbladder is surgically absent. Mild stable common bile duct dilatation. Pancreas: Stable sequela of chronic pancreatitis with marked atrophy and cystic change in the pancreatic tail. No findings for acute pancreatitis. No pancreatic ductal dilatation. Spleen: Normal size.  No focal lesions. Adrenals/Urinary Tract: The adrenal glands and kidneys are grossly normal. No renal, ureteral or bladder calculi or mass. Stomach/Bowel: The stomach, duodenum and small bowel are unremarkable. No findings for obstruction or perforation. The terminal ileum is normal. The cecum and ascending colon are dilated and fluid-filled with air-fluid levels. There is also some surrounding inflammation. This is due to a right-sided abdominal wall hernia at a surgical incision site which contains part of the transverse colon. The proximal/entering loop is obstructed. The transverse colon exiting the hernia is not obstructed. Vascular/Lymphatic: Advanced atherosclerotic calcifications involving the aorta and branch vessels. No aneurysm.  No mesenteric or retroperitoneal mass or adenopathy. Reproductive: The uterus and ovaries are unremarkable. Other: No pelvic mass or adenopathy. No free pelvic fluid collections. No inguinal mass or adenopathy. No subcutaneous lesions. Musculoskeletal: No significant bony findings. IMPRESSION: 1. Right-sided lower abdominal wall hernia contains part of the transverse colon. There is evidence of proximal obstruction with dilated cecum and ascending colon with air-fluid level and surrounding inflammation in the omental fat. There is also some inflammation and fluid in the hernia sac. 2. Normal CT appearance of the small bowel. 3. Cardiac enlargement and extensive coronary artery calcifications. 4. Chronic postinflammatory changes involving the pancreas without significant change since prior MRI abdomen from 2015. Electronically Signed   By: Marijo Sanes M.D.   On: 06/30/2016 15:12   Dg Abd Acute W/chest  Result Date: 06/30/2016 CLINICAL DATA:  Increase in hernia size over the last few days with increasing abdominal pain, initial encounter EXAM: DG ABDOMEN ACUTE W/ 1V CHEST COMPARISON:  06/04/2015 FINDINGS: Cardiac shadow remains enlarged. Aortic calcifications are again seen. The lungs are clear bilaterally. Old rib fractures are noted on the left. Scattered large and small bowel gas is noted. Some air-fluid levels are noted in the right mid abdomen. Given the patient's clinical history this may represent some very early partial small bowel obstruction. No colonic dilatation is seen. Degenerative changes of the lumbar spine are noted. IMPRESSION: Few dilated loops of small bowel with air-fluid levels. This may represent an early partial small bowel obstruction given the clinical history. CT imaging may be helpful for further evaluation. Electronically Signed   By: Inez Catalina M.D.   On: 06/30/2016 11:55    ECG & Cardiac Imaging    Afib - CVR ~70, inc RBBB. Non-specific ST-T changes -- stable  Last Echo  03/2016 - see [facility]  Assessment & Plan    Reason for Consult Pre-operative Cardiovascular Examination:  PREOPERATIVE CARDIAC RISK ASSESSMENT   Revised Cardiac Risk Index:  High Risk Surgery: yes; Intraperitoneal  Defined as Intraperitoneal, intrathoracic or suprainguinal vascular  Active CAD: no; no Dx of CAD  CHF: no; has chronic HFPEF - but stable (does had Pulm HTN from Wegener's)  Cerebrovascular Disease: yes; h/o  CVA  Diabetes: yes; On Insulin: yes  CKD (Cr >~ 2): No - Cr < 2  Total: Sgx, CVA & DM = 3 Estimated Risk of Adverse Outcome: HIGH RISK - based upon pre-existing conditions  Estimated Risk of MI, PE, VF/VT (Cardiac Arrest), Complete Heart Block: ~11 % - reduced to <6% with long standing BB dose.   ACC/AHA Guidelines for "Clearance":  Step 1 - Need for Emergency Surgery: Yes - essentially  If Yes - go straight to OR with perioperative surveillance  Step 2 - Active Cardiac Conditions (Unstable Angina, Decompensated HF, Significant  Arrhytmias - Complete HB, Mobitz II, Symptomatic VT or SVT, Severe Aortic Stenosis - mean gradient > 40 mmHg, Valve area < 1.0 cm2):   No: NO  If Yes - Evaluate & Treat per ACC/AHA Guidelines  Step 3 -  Low Risk Surgery: No: HIGH  If Yes --> proceed to OR  If No --> Step 4  Step 4 - Functional Capacity >= 4 METS without symptoms: Yes  If Yes --> proceed to OR  If No --> Step 5  Step 5 --  Clinical Risk Factors (CRF)   3 or more: Yes  If Yes -- assess Surgical Risk, --   (High Risk Non-cardiac), Intraabdominal or thoracic vascular surgery consider testing if it will change management.  Given the essentially Urgent/Emergent need for surgery, I would not delay surgery for any additional cardiac evaluation.  High risk is due to DM-2, CVA & Ex Lap surgery -- not due to active Cardiac Conditions.  Recommend: proceed to OR without additional cardiology workup (recent echo should suffice).  Avoid excessive volumes which  could worsen CHF - may need intermittent diuretics (seems euvolemic at present)   Principal Problem:   Recurrent umbilical hernia with incarceration Active Problems:   Chronic atrial fibrillation (HCC) CHADS2-VASc=6.  On Corrected "low" dose of Eliquis   Essential hypertension   Wegener's granulomatosis (granulomatosis with polyangiitis) (HCC)   Chronic kidney disease (CKD), stage IV (severe) (HCC)   Diastolic CHF, chronic (HCC) - on low dose Lasix. ARB & BB along with Norvasc   Hypothyroidism   Type 2 diabetes mellitus ([place])   Incarcerated hernia  She will need IV BB while not taking PO - would start with IV Metoprolol q6 hr  BP still high - would recommend using IV Hydralazine 10 mg q 6 hr (can increase) while NPO Hold Eliquis pending surgery - can bridge with IV Heparin & hold pre-op)  We will follow along.   Signed, Glenetta Hew, MD, MS 06/30/2016, 4:39 PM

## 2016-06-30 NOTE — Progress Notes (Addendum)
History and Physical    [name] [name] WFU:[id number] DOB: Feb 27, 1933 DOA: 06/30/2016    PCP: [name], MD  Patient coming from: home  Chief Complaint: nausea with eating  HPI: [name] [name] is a 83 y.o. female with medical history of A-fib on Eliquis, DM 2, HTN, Hypothyroid, mod pulm HTN, Wegner's granulomatosis, anxiety, idiopathic peripheral neuropathy & CKD 3.  She has been having nausea and vomiting when trying to eat and drink for a few days now. No hematemesis, no BMs. She does admit to abdominal bloating for about the same time. No abdominal pain. However, a note from a CMA from a telephone encounter at [facility] states that patient called for abdominal pain from her hernia which has been severe. Asked to come to the ER.   ED Course: found to have incarcerated ventral hernia, BP high 181/83 but other wise vitals stable.  Labs appear to be at baseline except Hb is higher than usual (10.2 instead of 8-9). Rate controlled A-fib.  Review of Systems:  All other systems reviewed and apart from HPI, are negative.  Past Medical History:  Diagnosis Date  . Arthritis    r tkr  . Breast cancer ([place])   . Chronic atrial fibrillation (HCC)    CHADS2-VASc=6.  on low dose Eliquis (corrected for age & renal fxn)  . CVA (cerebral vascular accident) ([place]) 2008   mini stroke.no residual  . DEPRESSION 05/16/2009  . Diabetes mellitus type II   . Heart murmur    stress test 2009.dr peter Martinique  . HYPERLIPIDEMIA 07/24/2008  . HYPERTENSION 07/24/2008  . HYPOTHYROIDISM 07/24/2008  . Intermittent vertigo   . Pneumonia   . Pulmonary hypertension   . Skin abnormality    facial lesions .pt applying mupiracin to areas  . Vertigo   . Wegener's disease, pulmonary ([place]) 10/2012    Past Surgical History:  Procedure Laterality Date  . BREAST SURGERY  2007   Lumpectomy, XRT 2006.l breast  . CHOLECYSTECTOMY  1980  . EXPLORATORY LAPAROTOMY    . HEEL SPUR SURGERY Right   . JOINT  REPLACEMENT     r knee  . KNEE SURGERY  2009   TKR  . TRANSTHORACIC ECHOCARDIOGRAM  03/2016   Afib (no Diastolic Fxn). EF 55-60%. No RWMA. Mild Aortic dilation Mild MR. Severe LA dilation. Mod RA dilation.  Mod TR with Mod-Severe Pulmonary HTN - but normal RV function..  . VIDEO ASSISTED THORACOSCOPY  04/22/2011   Procedure: VIDEO ASSISTED THORACOSCOPY;  Surgeon: Melrose Nakayama, MD;  Location: [facility];  Service: Thoracic;  Laterality: Left;  WITH BIOPSY    Social History:   reports that she quit smoking about 34 years ago. Her smoking use included Cigarettes. She has a 6.00 pack-year smoking history. She has never used smokeless tobacco. She reports that she does not drink alcohol or use drugs. Lives with husband.   Allergies  Allergen Reactions  . Codeine Sulfate Other (See Comments)    REACTION: GI upset  . Sulfonamide Derivatives Other (See Comments)    REACTION: GI upset  . Vancomycin Other (See Comments)    Red man syndrome  . Atarax [Hydroxyzine] Nausea Only and Rash    Family History  Problem Relation Age of Onset  . Arthritis Mother   . Rheum arthritis Mother   . Heart disease Father   . Coronary artery disease Father   . Cancer Sister     breast CA, both sisters  . Breast cancer Sister   .  Coronary artery disease Brother   . Lung cancer Brother      Prior to Admission medications   Medication Sig Start Date End Date Taking? Authorizing Provider  amLODipine (NORVASC) 5 MG tablet TAKE ONE TABLET BY MOUTH ONCE DAILY. 05/14/16  Yes [name], MD  BYSTOLIC 10 MG tablet TAKE ONE TABLET BY MOUTH ONCE DAILY AFTER  BREAKFAST. Patient taking differently: TAKE 55m TABLET BY MOUTH ONCE DAILY AFTER  BREAKFAST. 11/26/15  Yes BEulas Post MD  diazepam (VALIUM) 5 MG tablet Take one tablet by mouth as needed for severe vertigo flares Patient taking differently: Take 5 mg by mouth daily as needed (severe vertigo..Marland Kitchen.  11/15/15  Yes BEulas Post MD  ELIQUIS 2.5  MG TABS tablet TAKE ONE TABLET BY MOUTH TWICE DAILY Patient taking differently: TAKE 2.526mTABLET BY MOUTH TWICE DAILY 02/25/16  Yes Peter M JoMartiniqueMD  escitalopram (LEXAPRO) 10 MG tablet Take 1 tablet (10 mg total) by mouth daily. 11/30/15  Yes BrEulas PostMD  furosemide (LASIX) 20 MG tablet Take 1 tablet (20 mg total) by mouth daily. 04/04/16  Yes GaOswald HillockMD  gabapentin (NEURONTIN) 100 MG capsule TAKE ONE CAPSULE BY MOUTH TWICE DAILY BEFORE A  MEAL Patient taking differently: TAKE 10067mAPSULE BY MOUTH TWICE DAILY BEFORE A  MEAL 01/14/16  Yes BruEulas PostD  glimepiride (AMARYL) 2 MG tablet Take 1 tablet (2 mg total) by mouth daily with breakfast. TAKE ONE TABLET BY MOUTH ONCE DAILY BEFORE BREAKFAST 01/29/16  Yes BruEulas PostD  levothyroxine (SYNTHROID, LEVOTHROID) 100 MCG tablet Take 1 tablet (100 mcg total) by mouth daily before breakfast. 12/04/15  Yes BruEulas PostD  predniSONE (DELTASONE) 5 MG tablet Take 1 tablet (5 mg total) by mouth daily with breakfast. Patient taking differently: Take 5 mg by mouth daily.  07/18/15  Yes CliDeneise LeverD  triamcinolone cream (KENALOG) 0.1 % Apply 1 application topically 2 (two) times daily as needed. Patient not taking: Reported on 06/30/2016 05/15/15   BruEulas PostD    Physical Exam: Vitals:   06/30/16 1055 06/30/16 1352 06/30/16 1400 06/30/16 1523  BP:  (!) 186/92 (!) 163/116 (!) 181/83  Pulse:  73 85 76  Resp:  _0 Temp:  97.6 F (36.4 C)    TempSrc:  Oral    SpO2:  96% 98% 99%  Weight: 72.6 kg (160 lb)     Height: _1  (1.676 m)         Constitutional: NAD, calm, comfortable Eyes: PERTLA,  conjunctivae pale ENMT: Mucous membranes are dry. Posterior pharynx clear of any exudate or lesions. Normal dentition.  Neck: normal, supple, no masses, no thyromegaly Respiratory: clear to auscultation bilaterally, no wheezing, no crackles. Normal respiratory effort. No accessory muscle use.    Cardiovascular: S1 & S2 heard, regular rate and rhythm, no murmurs / rubs / gallops. No extremity edema. 2+ pedal pulses. No carotid bruits.  Abdomen:  Ventral hernia, non-reducible, she states it was not tender at all until the ER doctor examined her. Bowel sounds normal.  Musculoskeletal: no clubbing / cyanosis. No joint deformity upper and lower extremities. Good ROM, no contractures. Normal muscle tone.  Skin: no rashes, lesions, ulcers. No induration Neurologic: CN 2-12 grossly intact. Sensation intact, DTR normal. Strength 5/5 in all 4 limbs.  Psychiatric: Normal judgment and insight. Alert and oriented x 3. Normal mood.     Labs on Admission: I  have personally reviewed following labs and imaging studies  CBC:  Recent Labs Lab 06/30/16 1129  WBC 7.4  NEUTROABS 4.7  HGB 10.2*  HCT 30.6*  MCV 91.6  PLT 892   Basic Metabolic Panel:  Recent Labs Lab 06/30/16 1129  NA 136  K 4.0  CL 104  CO2 24  GLUCOSE 116*  BUN 23*  CREATININE 1.50*  CALCIUM 9.1   GFR: Estimated Creatinine Clearance: 30 mL/min (A) (by C-G formula based on SCr of 1.5 mg/dL (H)). Liver Function Tests:  Recent Labs Lab 06/30/16 1129  AST 16  ALT 9*  ALKPHOS 61  BILITOT 1.5*  PROT 7.8  ALBUMIN 3.8    Recent Labs Lab 06/30/16 1129  LIPASE 30   No results for input(s): AMMONIA in the last 168 hours. Coagulation Profile: No results for input(s): INR, PROTIME in the last 168 hours. Cardiac Enzymes: No results for input(s): CKTOTAL, CKMB, CKMBINDEX, TROPONINI in the last 168 hours. BNP (last 3 results) No results for input(s): PROBNP in the last 8760 hours. HbA1C: No results for input(s): HGBA1C in the last 72 hours. CBG: No results for input(s): GLUCAP in the last 168 hours. Lipid Profile: No results for input(s): CHOL, HDL, LDLCALC, TRIG, CHOLHDL, LDLDIRECT in the last 72 hours. Thyroid Function Tests: No results for input(s): TSH, T4TOTAL, FREET4, T3FREE, THYROIDAB in the last 72  hours. Anemia Panel: No results for input(s): VITAMINB12, FOLATE, FERRITIN, TIBC, IRON, RETICCTPCT in the last 72 hours. Urine analysis:    Component Value Date/Time   COLORURINE STRAW (A) 06/30/2016 1126   APPEARANCEUR CLEAR 06/30/2016 1126   LABSPEC 1.009 06/30/2016 1126   PHURINE 6.0 06/30/2016 1126   GLUCOSEU NEGATIVE 06/30/2016 1126   HGBUR SMALL (A) 06/30/2016 1126   BILIRUBINUR NEGATIVE 06/30/2016 1126   BILIRUBINUR n 12/30/2010 1700   KETONESUR 5 (A) 06/30/2016 1126   PROTEINUR NEGATIVE 06/30/2016 1126   UROBILINOGEN 1.0 09/25/2014 1550   NITRITE NEGATIVE 06/30/2016 1126   LEUKOCYTESUR MODERATE (A) 06/30/2016 1126   Sepsis Labs: _0 (procalcitonin:4,lacticidven:4) )No results found for this or any previous visit (from the past 240 hour(s)).   Radiological Exams on Admission: Ct Abdomen Pelvis Wo Contrast  Result Date: 06/30/2016 CLINICAL DATA:  Increasing size of abdominal wall hernia.  Vomiting. EXAM: CT ABDOMEN AND PELVIS WITHOUT CONTRAST TECHNIQUE: Multidetector CT imaging of the abdomen and pelvis was performed following the standard protocol without IV contrast. COMPARISON:  MRI abdomen 04/18/2013.  PET-CT 03/19/2012 FINDINGS: Lower chest: Basilar scarring changes without pleural effusion or focal pulmonary lesions. Cardiac enlargement with extensive coronary artery calcifications. Small hiatal hernia. Hepatobiliary: No focal hepatic lesions or intrahepatic biliary dilatation. The gallbladder is surgically absent. Mild stable common bile duct dilatation. Pancreas: Stable sequela of chronic pancreatitis with marked atrophy and cystic change in the pancreatic tail. No findings for acute pancreatitis. No pancreatic ductal dilatation. Spleen: Normal size.  No focal lesions. Adrenals/Urinary Tract: The adrenal glands and kidneys are grossly normal. No renal, ureteral or bladder calculi or mass. Stomach/Bowel: The stomach, duodenum and small bowel are unremarkable. No findings  for obstruction or perforation. The terminal ileum is normal. The cecum and ascending colon are dilated and fluid-filled with air-fluid levels. There is also some surrounding inflammation. This is due to a right-sided abdominal wall hernia at a surgical incision site which contains part of the transverse colon. The proximal/entering loop is obstructed. The transverse colon exiting the hernia is not obstructed. Vascular/Lymphatic: Advanced atherosclerotic calcifications involving the aorta and branch vessels. No  aneurysm. No mesenteric or retroperitoneal mass or adenopathy. Reproductive: The uterus and ovaries are unremarkable. Other: No pelvic mass or adenopathy. No free pelvic fluid collections. No inguinal mass or adenopathy. No subcutaneous lesions. Musculoskeletal: No significant bony findings. IMPRESSION: 1. Right-sided lower abdominal wall hernia contains part of the transverse colon. There is evidence of proximal obstruction with dilated cecum and ascending colon with air-fluid level and surrounding inflammation in the omental fat. There is also some inflammation and fluid in the hernia sac. 2. Normal CT appearance of the small bowel. 3. Cardiac enlargement and extensive coronary artery calcifications. 4. Chronic postinflammatory changes involving the pancreas without significant change since prior MRI abdomen from 2015. Electronically Signed   By: Marijo Sanes M.D.   On: 06/30/2016 15:12   Dg Abd Acute W/chest  Result Date: 06/30/2016 CLINICAL DATA:  Increase in hernia size over the last few days with increasing abdominal pain, initial encounter EXAM: DG ABDOMEN ACUTE W/ 1V CHEST COMPARISON:  06/04/2015 FINDINGS: Cardiac shadow remains enlarged. Aortic calcifications are again seen. The lungs are clear bilaterally. Old rib fractures are noted on the left. Scattered large and small bowel gas is noted. Some air-fluid levels are noted in the right mid abdomen. Given the patient's clinical history this may  represent some very early partial small bowel obstruction. No colonic dilatation is seen. Degenerative changes of the lumbar spine are noted. IMPRESSION: Few dilated loops of small bowel with air-fluid levels. This may represent an early partial small bowel obstruction given the clinical history. CT imaging may be helpful for further evaluation. Electronically Signed   By: Inez Catalina M.D.   On: 06/30/2016 11:55    EKG: Independently reviewed. A-fib, RAD HR in 70s.   Assessment/Plan  Active Problems:   Incarcerated hernia - surgery to manage - PA note states they are considering taking her to OR in 24-48 hrs - last dose of Eliquis last night - will hold all oral meds  Chronic atrial fibrillation (HCC) CHADS2-VASc=6 - last dose of  Eliquis last night - start Heparin infusion-  - rate controlled- change Bystolic to IV Lopressor 25m Q 6hr    Hypothyroidism - synthroid IV    Essential hypertension - IV Metoprolol    Type 2 diabetes mellitus  - last A1c 6.2 in 8.17 - hold Amaryl- low dose ISS Q 4 hrs    Wegener's granulomatosis (granulomatosis with polyangiitis)  - the patient states that was told by Dr YAnnamaria Bootsto take 526mPrednisone QOD after her pneumonia in 12/17- she does not states she has a diagnosis of Wegner's but it is mentions on her PCP's notes - hold Prednisone for now  Idiopathic neuropathy (per notes) - in feet and hands- she states it is from her DM - hold Neurontin    Chronic kidney disease (CKD), stage 3 - stable Cr    Diastolic CHF, chronic    Pulm HTN  - hold Lasix - follow I and O and daily weights- fluids only at 75 cc/hr for now   Anxiety - started on Lexapro 4 mo ago- does not take regularly because it gives her dreams of people who have died - takes Valium rarely when anxious- took it a few days ago - will order a small dose of Ativan in case her anxiety becomes severe      DVT prophylaxis: SCDs Code Status: Full code  Family Communication: son    Disposition Plan: admit to tele  Consults called: surgery called by ER  Admission status: inpatient    [facility] MD [facility] Pager: [email or web address] Password [email or web address] 7PM-7AM, please contact night-coverage   06/30/2016, 4:38 PM

## 2016-06-30 NOTE — ED Notes (Signed)
Patient transported to X-ray

## 2016-06-30 NOTE — Progress Notes (Signed)
CT abd/pelvis resulted. NP reviewed results and paged surgeon to make aware since they are on the case for this lady's incarcerated hernia and bowel obstruction. Dr. Excell Seltzer returned call, had reviewed CT, and stated pt would need surgery, but not urgently tonight. KJKG, NP [facility]

## 2016-06-30 NOTE — Telephone Encounter (Signed)
Pt has checked in to [facility] ED for evaluation.

## 2016-06-30 NOTE — ED Notes (Signed)
Patient states that she can not give urine sample at this time. Patient will call out when ready.

## 2016-06-30 NOTE — Telephone Encounter (Signed)
Patient called with c/o abdominal pain from hernia. She reports that she cannot eat and keep anything down and has not had a bowel movement in several days. She states that the pain has been significant for several days. Pt is in tears on the phone. Advised pt that she needs to be seen and evaluated immediately, as hernia could possibly be causing a blockage. She will have her son take her to [facility] ED for evaluation. Will monitor chart.

## 2016-07-01 ENCOUNTER — Inpatient Hospital Stay (HOSPITAL_COMMUNITY): Payer: Medicare Other | Admitting: Anesthesiology

## 2016-07-01 ENCOUNTER — Encounter (HOSPITAL_COMMUNITY)
Admission: EM | Disposition: A | Discharge status: Home / Self Care | Payer: Self-pay | Source: Home / Self Care | Attending: Internal Medicine

## 2016-07-01 DIAGNOSIS — N184 Chronic kidney disease, stage 4 (severe): Secondary | ICD-10-CM

## 2016-07-01 DIAGNOSIS — E440 Moderate protein-calorie malnutrition: Secondary | ICD-10-CM | POA: Insufficient documentation

## 2016-07-01 HISTORY — PX: VENTRAL HERNIA REPAIR: SHX424

## 2016-07-01 LAB — BASIC METABOLIC PANEL
Anion gap: 9 (ref 5–15)
BUN: 19 mg/dL (ref 6–20)
CO2: 21 mmol/L — ABNORMAL LOW (ref 22–32)
CREATININE: 1.19 mg/dL — AB (ref 0.44–1.00)
Calcium: 8.6 mg/dL — ABNORMAL LOW (ref 8.9–10.3)
Chloride: 105 mmol/L (ref 101–111)
GFR calc Af Amer: 48 mL/min — ABNORMAL LOW (ref >60)
GFR, EST NON AFRICAN AMERICAN: 42 mL/min — AB (ref >60)
GLUCOSE: 111 mg/dL — AB (ref 65–99)
Potassium: 3.7 mmol/L (ref 3.5–5.1)
SODIUM: 135 mmol/L (ref 135–145)

## 2016-07-01 LAB — TYPE AND SCREEN
ABO/RH(D): A POS
ANTIBODY SCREEN: NEGATIVE

## 2016-07-01 LAB — GLUCOSE, CAPILLARY
GLUCOSE-CAPILLARY: 107 mg/dL — AB (ref 65–99)
GLUCOSE-CAPILLARY: 80 mg/dL (ref 65–99)
Glucose-Capillary: 92 mg/dL (ref 65–99)
Glucose-Capillary: 132 mg/dL — ABNORMAL HIGH (ref 65–99)
Glucose-Capillary: 67 mg/dL (ref 65–99)
Glucose-Capillary: 125 mg/dL — ABNORMAL HIGH (ref 65–99)
Glucose-Capillary: 119 mg/dL — ABNORMAL HIGH (ref 65–99)
Glucose-Capillary: 136 mg/dL — ABNORMAL HIGH (ref 65–99)

## 2016-07-01 LAB — APTT: APTT: 38 s — AB (ref 24–36)

## 2016-07-01 LAB — CBC
HCT: 30 % — ABNORMAL LOW (ref 36.0–46.0)
Hemoglobin: 10 g/dL — ABNORMAL LOW (ref 12.0–15.0)
MCH: 31 pg (ref 26.0–34.0)
MCHC: 33.3 g/dL (ref 30.0–36.0)
MCV: 92.9 fL (ref 78.0–100.0)
Platelets: 223 10*3/uL (ref 150–400)
RBC: 3.23 MIL/uL — ABNORMAL LOW (ref 3.87–5.11)
RDW: 13.6 % (ref 11.5–15.5)
WBC: 8.3 10*3/uL (ref 4.0–10.5)

## 2016-07-01 LAB — HEPARIN LEVEL (UNFRACTIONATED): Heparin Unfractionated: 0.85 IU/mL — ABNORMAL HIGH (ref 0.30–0.70)

## 2016-07-01 SURGERY — REPAIR, HERNIA, VENTRAL
Anesthesia: General

## 2016-07-01 MED ORDER — 0.9 % SODIUM CHLORIDE (POUR BTL) OPTIME
TOPICAL | Status: DC | PRN
  Administered 2016-07-01: 1000 mL

## 2016-07-01 MED ORDER — LACTATED RINGERS IV SOLN
INTRAVENOUS | Status: DC
  Administered 2016-07-01: 14:00:00 via INTRAVENOUS

## 2016-07-01 MED ORDER — HYDROMORPHONE HCL 1 MG/ML IJ SOLN
INTRAMUSCULAR | Status: AC
  Administered 2016-07-01: 0.25 mg via INTRAVENOUS
  Filled 2016-07-01: qty 0.5

## 2016-07-01 MED ORDER — MEPERIDINE HCL 50 MG/ML IJ SOLN: 6.2500 mg | INTRAMUSCULAR | Status: DC | PRN

## 2016-07-01 MED ORDER — HEPARIN (PORCINE) IN NACL 100-0.45 UNIT/ML-% IJ SOLN
1200.0000 [IU]/h | INTRAMUSCULAR | Status: DC
  Administered 2016-07-01 – 2016-07-02 (×2): 1200 [IU]/h via INTRAVENOUS
  Filled 2016-07-01 (×4): qty 250

## 2016-07-01 MED ORDER — LABETALOL HCL 5 MG/ML IV SOLN
5.0000 mg | INTRAVENOUS | Status: AC | PRN
  Administered 2016-07-01 (×4): 5 mg via INTRAVENOUS

## 2016-07-01 MED ORDER — PROPOFOL 10 MG/ML IV BOLUS
INTRAVENOUS | Status: DC | PRN
  Administered 2016-07-01: 120 mg via INTRAVENOUS

## 2016-07-01 MED ORDER — MORPHINE SULFATE (PF) 4 MG/ML IV SOLN
1.0000 mg | INTRAVENOUS | Status: DC | PRN
  Administered 2016-07-01: 2 mg via INTRAVENOUS
  Filled 2016-07-01: qty 1

## 2016-07-01 MED ORDER — PHENYLEPHRINE 40 MCG/ML (10ML) SYRINGE FOR IV PUSH (FOR BLOOD PRESSURE SUPPORT)
PREFILLED_SYRINGE | INTRAVENOUS | Status: AC
  Filled 2016-07-01: qty 10

## 2016-07-01 MED ORDER — PROPOFOL 10 MG/ML IV BOLUS
INTRAVENOUS | Status: AC
  Filled 2016-07-01: qty 20

## 2016-07-01 MED ORDER — ROCURONIUM BROMIDE 50 MG/5ML IV SOSY
PREFILLED_SYRINGE | INTRAVENOUS | Status: AC
  Filled 2016-07-01: qty 5

## 2016-07-01 MED ORDER — DEXAMETHASONE SODIUM PHOSPHATE 10 MG/ML IJ SOLN
INTRAMUSCULAR | Status: DC | PRN
  Administered 2016-07-01: 10 mg via INTRAVENOUS

## 2016-07-01 MED ORDER — HEPARIN (PORCINE) IN NACL 100-0.45 UNIT/ML-% IJ SOLN: 1200.0000 [IU]/h | INTRAMUSCULAR | Status: DC

## 2016-07-01 MED ORDER — PHENYLEPHRINE HCL 10 MG/ML IJ SOLN
INTRAMUSCULAR | Status: DC | PRN
  Administered 2016-07-01: 40 ug via INTRAVENOUS

## 2016-07-01 MED ORDER — SUCCINYLCHOLINE CHLORIDE 200 MG/10ML IV SOSY
PREFILLED_SYRINGE | INTRAVENOUS | Status: AC
Start: 2016-07-01 — End: 2016-07-01
  Filled 2016-07-01: qty 10

## 2016-07-01 MED ORDER — SUGAMMADEX SODIUM 200 MG/2ML IV SOLN
INTRAVENOUS | Status: AC
  Filled 2016-07-01: qty 2

## 2016-07-01 MED ORDER — DEXTROSE 5 % IV SOLN
1.0000 g | Freq: Two times a day (BID) | INTRAVENOUS | Status: DC
  Administered 2016-07-01 – 2016-07-02 (×4): 1 g via INTRAVENOUS
  Filled 2016-07-01 (×3): qty 1

## 2016-07-01 MED ORDER — LABETALOL HCL 5 MG/ML IV SOLN
INTRAVENOUS | Status: AC
  Administered 2016-07-01: 5 mg via INTRAVENOUS
  Filled 2016-07-01: qty 4

## 2016-07-01 MED ORDER — SUCCINYLCHOLINE CHLORIDE 200 MG/10ML IV SOSY
PREFILLED_SYRINGE | INTRAVENOUS | Status: DC | PRN
  Administered 2016-07-01: 100 mg via INTRAVENOUS

## 2016-07-01 MED ORDER — ONDANSETRON HCL 4 MG/2ML IJ SOLN
4.0000 mg | Freq: Once | INTRAMUSCULAR | Status: AC | PRN
  Administered 2016-07-01: 4 mg via INTRAVENOUS

## 2016-07-01 MED ORDER — SUGAMMADEX SODIUM 200 MG/2ML IV SOLN
INTRAVENOUS | Status: DC | PRN
  Administered 2016-07-01: 200 mg via INTRAVENOUS

## 2016-07-01 MED ORDER — LIDOCAINE 2% (20 MG/ML) 5 ML SYRINGE
INTRAMUSCULAR | Status: DC | PRN
  Administered 2016-07-01: 60 mg via INTRAVENOUS

## 2016-07-01 MED ORDER — HYDROMORPHONE HCL 1 MG/ML IJ SOLN
INTRAMUSCULAR | Status: AC
  Filled 2016-07-01: qty 1

## 2016-07-01 MED ORDER — PROPOFOL 10 MG/ML IV BOLUS: INTRAVENOUS | Status: DC | PRN

## 2016-07-01 MED ORDER — ONDANSETRON HCL 4 MG/2ML IJ SOLN
INTRAMUSCULAR | Status: DC | PRN
  Administered 2016-07-01: 4 mg via INTRAVENOUS

## 2016-07-01 MED ORDER — ROCURONIUM BROMIDE 50 MG/5ML IV SOSY
PREFILLED_SYRINGE | INTRAVENOUS | Status: AC
  Filled 2016-07-01: qty 10

## 2016-07-01 MED ORDER — ROCURONIUM BROMIDE 50 MG/5ML IV SOSY
PREFILLED_SYRINGE | INTRAVENOUS | Status: DC | PRN
Start: 2016-07-01 — End: 2016-07-01
  Administered 2016-07-01: 10 mg via INTRAVENOUS
  Administered 2016-07-01: 20 mg via INTRAVENOUS
  Administered 2016-07-01: 10 mg via INTRAVENOUS

## 2016-07-01 MED ORDER — CHLORHEXIDINE GLUCONATE 0.12 % MT SOLN
15.0000 mL | Freq: Two times a day (BID) | OROMUCOSAL | Status: DC
  Filled 2016-07-01 (×6): qty 15

## 2016-07-01 MED ORDER — ORAL CARE MOUTH RINSE: 15.0000 mL | Freq: Two times a day (BID) | OROMUCOSAL | Status: DC

## 2016-07-01 MED ORDER — DEXAMETHASONE SODIUM PHOSPHATE 10 MG/ML IJ SOLN
INTRAMUSCULAR | Status: AC
  Filled 2016-07-01: qty 1

## 2016-07-01 MED ORDER — FENTANYL CITRATE (PF) 250 MCG/5ML IJ SOLN
INTRAMUSCULAR | Status: DC | PRN
  Administered 2016-07-01 (×4): 25 ug via INTRAVENOUS

## 2016-07-01 MED ORDER — DEXTROSE 50 % IV SOLN
25.0000 mL | Freq: Once | INTRAVENOUS | Status: AC
  Administered 2016-07-01: 25 mL via INTRAVENOUS

## 2016-07-01 MED ORDER — ONDANSETRON HCL 4 MG/2ML IJ SOLN
INTRAMUSCULAR | Status: AC
  Filled 2016-07-01: qty 2

## 2016-07-01 MED ORDER — METHYLPREDNISOLONE SODIUM SUCC 40 MG IJ SOLR
4.0000 mg | INTRAMUSCULAR | Status: DC
  Administered 2016-07-01 – 2016-07-03 (×3): 4 mg via INTRAVENOUS
  Filled 2016-07-01 (×2): qty 1
  Filled 2016-07-01 (×2): qty 0.1
  Filled 2016-07-01: qty 1

## 2016-07-01 MED ORDER — FENTANYL CITRATE (PF) 100 MCG/2ML IJ SOLN
INTRAMUSCULAR | Status: AC
  Filled 2016-07-01: qty 2

## 2016-07-01 MED ORDER — HYDROMORPHONE HCL 1 MG/ML IJ SOLN
0.2500 mg | INTRAMUSCULAR | Status: DC | PRN
Start: 2016-07-01 — End: 2016-07-01
  Administered 2016-07-01 (×2): 0.25 mg via INTRAVENOUS
  Filled 2016-07-01: qty 0.5

## 2016-07-01 MED ORDER — LIDOCAINE 2% (20 MG/ML) 5 ML SYRINGE
INTRAMUSCULAR | Status: AC
Start: 2016-07-01 — End: 2016-07-01
  Filled 2016-07-01: qty 5

## 2016-07-01 MED ORDER — DEXTROSE 50 % IV SOLN
INTRAVENOUS | Status: AC
  Filled 2016-07-01: qty 50

## 2016-07-01 SURGICAL SUPPLY — 50 items
APL SKNCLS STERI-STRIP NONHPOA (GAUZE/BANDAGES/DRESSINGS)
BENZOIN TINCTURE PRP APPL 2/3 (GAUZE/BANDAGES/DRESSINGS) IMPLANT
BINDER ABDOMINAL 12 ML 46-62 (SOFTGOODS) IMPLANT
CATH FOLEY 3WAY  5CC 16FR (CATHETERS)
CATH FOLEY 3WAY 5CC 16FR (CATHETERS) IMPLANT
CHLORAPREP W/TINT 26ML (MISCELLANEOUS) ×3 IMPLANT
CLOSURE WOUND 1/2 X4 (GAUZE/BANDAGES/DRESSINGS)
COVER SURGICAL LIGHT HANDLE (MISCELLANEOUS) ×3 IMPLANT
DECANTER SPIKE VIAL GLASS SM (MISCELLANEOUS) ×3 IMPLANT
DRAPE LAPAROSCOPIC ABDOMINAL (DRAPES) ×3 IMPLANT
DRSG TEGADERM 2-3/8X2-3/4 SM (GAUZE/BANDAGES/DRESSINGS) ×9 IMPLANT
ELECT PENCIL ROCKER SW 15FT (MISCELLANEOUS) IMPLANT
ELECT REM PT RETURN 15FT ADLT (MISCELLANEOUS) ×3 IMPLANT
GAUZE SPONGE 2X2 8PLY STRL LF (GAUZE/BANDAGES/DRESSINGS) IMPLANT
GLOVE BIO SURGEON STRL SZ 6 (GLOVE) ×3 IMPLANT
GLOVE BIOGEL PI IND STRL 6.5 (GLOVE) ×1 IMPLANT
GLOVE BIOGEL PI IND STRL 7.0 (GLOVE) ×1 IMPLANT
GLOVE BIOGEL PI INDICATOR 6.5 (GLOVE) ×2
GLOVE BIOGEL PI INDICATOR 7.0 (GLOVE) ×2
GLOVE ECLIPSE 8.0 STRL XLNG CF (GLOVE) ×3 IMPLANT
GLOVE INDICATOR 6.5 STRL GRN (GLOVE) ×3 IMPLANT
GOWN STRL REUS W/ TWL XL LVL3 (GOWN DISPOSABLE) ×3 IMPLANT
GOWN STRL REUS W/TWL 2XL LVL3 (GOWN DISPOSABLE) ×3 IMPLANT
GOWN STRL REUS W/TWL XL LVL3 (GOWN DISPOSABLE) ×9
HANDLE SUCTION POOLE (INSTRUMENTS) IMPLANT
KIT BASIN OR (CUSTOM PROCEDURE TRAY) ×3 IMPLANT
MARKER SKIN DUAL TIP RULER LAB (MISCELLANEOUS) ×3 IMPLANT
NDL SPNL 22GX3.5 QUINCKE BK (NEEDLE) IMPLANT
NEEDLE SPNL 22GX3.5 QUINCKE BK (NEEDLE) IMPLANT
NS IRRIG 1000ML POUR BTL (IV SOLUTION) ×3 IMPLANT
PACK GENERAL/GYN (CUSTOM PROCEDURE TRAY) ×3 IMPLANT
SHEARS HARMONIC ACE PLUS 36CM (ENDOMECHANICALS) IMPLANT
SOLUTION ANTI FOG 6CC (MISCELLANEOUS) ×3 IMPLANT
SPONGE GAUZE 2X2 STER 10/PKG (GAUZE/BANDAGES/DRESSINGS)
SPONGE LAP 18X18 X RAY DECT (DISPOSABLE) ×2 IMPLANT
STAPLER VISISTAT (STAPLE) ×2 IMPLANT
STRIP CLOSURE SKIN 1/2X4 (GAUZE/BANDAGES/DRESSINGS) IMPLANT
SUCTION POOLE HANDLE (INSTRUMENTS) ×3
SUT ETHIBOND NAB CT1 #1 30IN (SUTURE) IMPLANT
SUT MNCRL AB 4-0 PS2 18 (SUTURE) ×5 IMPLANT
SUT NOVA NAB DX-16 0-1 5-0 T12 (SUTURE) ×6 IMPLANT
SUT PDS AB 1 TP1 96 (SUTURE) ×4 IMPLANT
SUT PROLENE 1 CT 1 30 (SUTURE) IMPLANT
SUT VIC AB 2-0 SH 18 (SUTURE) ×2 IMPLANT
SUT VIC AB 2-0 UR6 27 (SUTURE) IMPLANT
SUT VIC AB 3-0 SH 18 (SUTURE) ×2 IMPLANT
SYR 10ML LL (SYRINGE) IMPLANT
TACKER 5MM HERNIA 3.5CML NAB (ENDOMECHANICALS) ×6 IMPLANT
TOWEL OR 17X26 10 PK STRL BLUE (TOWEL DISPOSABLE) ×3 IMPLANT
TRAY FOLEY W/METER SILVER 16FR (SET/KITS/TRAYS/PACK) IMPLANT

## 2016-07-01 NOTE — Anesthesia Preprocedure Evaluation (Signed)
Anesthesia Evaluation  Patient identified by MRN, date of birth, ID band Patient awake    Reviewed: Allergy & Precautions, H&P , Patient's Chart, lab work & pertinent test results, reviewed documented beta blocker date and time   Airway Mallampati: II  TM Distance: >3 FB Neck ROM: full    Dental no notable dental hx.    Pulmonary former smoker,    Pulmonary exam normal breath sounds clear to auscultation       Cardiovascular hypertension,  Rhythm:regular Rate:Normal     Neuro/Psych    GI/Hepatic   Endo/Other  diabetes  Renal/GU      Musculoskeletal   Abdominal   Peds  Hematology   Anesthesia Other Findings  HTN  DM2 anemia  prior TIA   atrial fibrillation; rate controlled with BBlockers. She is on  Eliquis; off since 3/25. Normal platelet count Echo 04/14/13 EF 60-65%,  mild-mod MR, mod to severe LAE, mild RVE, mod RAE, moderate TR with pulm HTN....PASP- 60 mm Hg.    Reproductive/Obstetrics                             Anesthesia Physical Anesthesia Plan  ASA: II and emergent  Anesthesia Plan: General   Post-op Pain Management:    Induction: Intravenous, Rapid sequence and Cricoid pressure planned  Airway Management Planned: Oral ETT  Additional Equipment:   Intra-op Plan:   Post-operative Plan: Extubation in OR  Informed Consent: I have reviewed the patients History and Physical, chart, labs and discussed the procedure including the risks, benefits and alternatives for the proposed anesthesia with the patient or authorized representative who has indicated his/her understanding and acceptance.   Dental Advisory Given  Plan Discussed with: CRNA and Surgeon  Anesthesia Plan Comments: (  )        Anesthesia Quick Evaluation

## 2016-07-01 NOTE — Progress Notes (Signed)
ANTICOAGULATION CONSULT NOTE - Follow Up Consult  Pharmacy Consult for heparin Indication: atrial fibrillation  Allergies  Allergen Reactions  . Codeine Sulfate Other (See Comments)    REACTION: GI upset  . Sulfonamide Derivatives Other (See Comments)    REACTION: GI upset  . Vancomycin Other (See Comments)    Red man syndrome  . Atarax [Hydroxyzine] Nausea Only and Rash    Patient Measurements: Height: 5' 6" (167.6 cm) Weight: 157 lb 6.5 oz (71.4 kg) IBW/kg (Calculated) : 59.3 Heparin Dosing Weight:   Vital Signs: Temp: 98.1 F (36.7 C) (03/26 2106) Temp Source: Oral (03/26 2106) BP: 141/69 (03/27 0201) Pulse Rate: 79 (03/26 2106)  Labs:  Recent Labs  06/30/16 1129 06/30/16 2020 07/01/16 0256  HGB 10.2*  --  10.0*  HCT 30.6*  --  30.0*  PLT 211  --  223  APTT  --  36 38*  HEPARINUNFRC  --  1.01* 0.85*  CREATININE 1.50*  --  1.19*    Estimated Creatinine Clearance: 37.5 mL/min (A) (by C-G formula based on SCr of 1.19 mg/dL (H)).   Medications:  Infusions:  . 0.9 % NaCl with KCl 20 mEq / L 75 mL/hr at 06/30/16 1744  . heparin 1,000 Units/hr (06/30/16 1901)    Assessment: Patient with low PTT but high heparin level.  PTT ordered with Heparin level until both correlate due to possible drug-lab interaction between oral anticoagulant (rivaroxaban, edoxaban, or apixaban) and anti-Xa level (aka heparin level) .   No heparin issues noted.  Goal of Therapy:  Heparin level 0.3-0.7 units/ml aPTT 66-102 seconds Monitor platelets by anticoagulation protocol: Yes   Plan:  Will increase heparin to 1200 units/hr Recheck level at [place] 07/01/2016,4:42 AM

## 2016-07-01 NOTE — Care Management Note (Signed)
Case Management Note  Patient Details  Name: [name] [name] MRN: [id number] Date of Birth: [birth year]  Subjective/Objective:  83 y/o f admitted w/Incarcerated hernia. From home. Surgery today. Continue to follow post surgery.                 Action/Plan:d/c plan home.   Expected Discharge Date:                  Expected Discharge Plan:  Home/Self Care  In-House Referral:     Discharge planning Services  CM Consult  Post Acute Care Choice:    Choice offered to:     DME Arranged:    DME Agency:     HH Arranged:    HH Agency:     Status of Service:  In process, will continue to follow  If discussed at Long Length of Stay Meetings, dates discussed:    Additional Comments:  Dessa Phi, RN 07/01/2016, 11:12 AM

## 2016-07-01 NOTE — Progress Notes (Signed)
Progress Note  Patient Name: [name] [name] Date of Encounter: 07/01/2016  Primary Cardiologist: [place]  Subjective   Feeling better with NG tube in place. Planned for surgery today.   Inpatient Medications    Scheduled Meds: . cefoTEtan (CEFOTAN) IV  1 g Intravenous Q12H  . chlorhexidine  15 mL Mouth/Throat BID  . chlorhexidine  15 mL Mouth Rinse BID  . insulin aspart  0-9 Units Subcutaneous Q4H  . levothyroxine  50 mcg Intravenous Daily  . mouth rinse  15 mL Mouth Rinse q12n4p  . metoprolol  5 mg Intravenous Q6H   Continuous Infusions: . 0.9 % NaCl with KCl 20 mEq / L 75 mL/hr at 06/30/16 1744   PRN Meds: acetaminophen **OR** acetaminophen, diazepam, hydrALAZINE, morphine injection, ondansetron **OR** ondansetron (ZOFRAN) IV, prochlorperazine   Vital Signs    Vitals:   06/30/16 1848 06/30/16 2106 07/01/16 0201 07/01/16 0523  BP: (!) 185/88 (!) 156/77 (!) 141/69 (!) 107/95  Pulse:  79  66  Resp:  20  18  Temp:  98.1 F (36.7 C)  98 F (36.7 C)  TempSrc:  Oral  Oral  SpO2:  97%  100%  Weight:      Height:        Intake/Output Summary (Last 24 hours) at 07/01/16 0240 Last data filed at 07/01/16 0700  Gross per 24 hour  Intake          2537.83 ml  Output              850 ml  Net          1687.83 ml   Filed Weights   06/30/16 1055 06/30/16 1725  Weight: 160 lb (72.6 kg) 157 lb 6.5 oz (71.4 kg)    Telemetry    AFib (rate controlled) - Personally Reviewed  ECG    Afib - Personally Reviewed  Physical Exam   General: Well developed, well nourished, female appearing in no acute distress. Head: Normocephalic, atraumatic.  Neck: Supple without bruits, JVD. Lungs:  Resp regular and unlabored, CTA. Heart: Irreg Irreg, S1, S2, no S3, S4, soft systolic murmur; no rub. Abdomen: Soft, non-tender, non-distended with normoactive bowel sounds. No hepatomegaly. No rebound/guarding. No obvious abdominal masses. Extremities: No clubbing, cyanosis, edema. Distal  pedal pulses are 2+ bilaterally. Neuro: Alert and oriented X 3. Moves all extremities spontaneously. Psych: Normal affect.  Labs    Chemistry Recent Labs Lab 06/30/16 1129 07/01/16 0256  NA 136 135  K 4.0 3.7  CL 104 105  CO2 24 21*  GLUCOSE 116* 111*  BUN 23* 19  CREATININE 1.50* 1.19*  CALCIUM 9.1 8.6*  PROT 7.8  --   ALBUMIN 3.8  --   AST 16  --   ALT 9*  --   ALKPHOS 61  --   BILITOT 1.5*  --   GFRNONAA 31* 42*  GFRAA 36* 48*  ANIONGAP 8 9     Hematology Recent Labs Lab 06/30/16 1129 07/01/16 0256  WBC 7.4 8.3  RBC 3.34* 3.23*  HGB 10.2* 10.0*  HCT 30.6* 30.0*  MCV 91.6 92.9  MCH 30.5 31.0  MCHC 33.3 33.3  RDW 13.5 13.6  PLT 211 223    Cardiac EnzymesNo results for input(s): TROPONINI in the last 168 hours. No results for input(s): TROPIPOC in the last 168 hours.   BNPNo results for input(s): BNP, PROBNP in the last 168 hours.   DDimer No results for input(s): DDIMER in the last 168 hours.  Radiology    Ct Abdomen Pelvis Wo Contrast  Result Date: 06/30/2016 CLINICAL DATA:  Increasing size of abdominal wall hernia.  Vomiting. EXAM: CT ABDOMEN AND PELVIS WITHOUT CONTRAST TECHNIQUE: Multidetector CT imaging of the abdomen and pelvis was performed following the standard protocol without IV contrast. COMPARISON:  MRI abdomen 04/18/2013.  PET-CT 03/19/2012 FINDINGS: Lower chest: Basilar scarring changes without pleural effusion or focal pulmonary lesions. Cardiac enlargement with extensive coronary artery calcifications. Small hiatal hernia. Hepatobiliary: No focal hepatic lesions or intrahepatic biliary dilatation. The gallbladder is surgically absent. Mild stable common bile duct dilatation. Pancreas: Stable sequela of chronic pancreatitis with marked atrophy and cystic change in the pancreatic tail. No findings for acute pancreatitis. No pancreatic ductal dilatation. Spleen: Normal size.  No focal lesions. Adrenals/Urinary Tract: The adrenal glands and  kidneys are grossly normal. No renal, ureteral or bladder calculi or mass. Stomach/Bowel: The stomach, duodenum and small bowel are unremarkable. No findings for obstruction or perforation. The terminal ileum is normal. The cecum and ascending colon are dilated and fluid-filled with air-fluid levels. There is also some surrounding inflammation. This is due to a right-sided abdominal wall hernia at a surgical incision site which contains part of the transverse colon. The proximal/entering loop is obstructed. The transverse colon exiting the hernia is not obstructed. Vascular/Lymphatic: Advanced atherosclerotic calcifications involving the aorta and branch vessels. No aneurysm. No mesenteric or retroperitoneal mass or adenopathy. Reproductive: The uterus and ovaries are unremarkable. Other: No pelvic mass or adenopathy. No free pelvic fluid collections. No inguinal mass or adenopathy. No subcutaneous lesions. Musculoskeletal: No significant bony findings. IMPRESSION: 1. Right-sided lower abdominal wall hernia contains part of the transverse colon. There is evidence of proximal obstruction with dilated cecum and ascending colon with air-fluid level and surrounding inflammation in the omental fat. There is also some inflammation and fluid in the hernia sac. 2. Normal CT appearance of the small bowel. 3. Cardiac enlargement and extensive coronary artery calcifications. 4. Chronic postinflammatory changes involving the pancreas without significant change since prior MRI abdomen from 2015. Electronically Signed   By: Marijo Sanes M.D.   On: 06/30/2016 15:12   Dg Abd 1 View  Result Date: 06/30/2016 CLINICAL DATA:  Gastric tube placement EXAM: ABDOMEN - 1 VIEW COMPARISON:  Same day CT of the abdomen and pelvis FINDINGS: Further advancement of a gastric tube is recommended as the side-port of the tube is seen at the gastroesophageal junction. Mildly distended small bowel loops are seen predominantly left hemiabdomen with  moderate gaseous distention of large bowel in the right hemiabdomen. Suture material noted in sagittal plane overlying the mid abdomen. Cholecystectomy clips are seen in right upper quadrant. IMPRESSION: Further advancement of the gastric tube of at least 4-5 cm is recommended as the side-port is noted at the GE junction. Mild to moderate gaseous distention of small bowel loops in the left hemiabdomen. Moderate gaseous distention large bowel in the right hemiabdomen. Findings are likely related to the noted ventral hernia seen on same day CT. These results will be called to the ordering clinician or representative by the Radiologist Assistant, and communication documented in the PACS or zVision Dashboard. Electronically Signed   By: Ashley Royalty M.D.   On: 06/30/2016 19:10   Dg Abd Acute W/chest  Result Date: 06/30/2016 CLINICAL DATA:  Increase in hernia size over the last few days with increasing abdominal pain, initial encounter EXAM: DG ABDOMEN ACUTE W/ 1V CHEST COMPARISON:  06/04/2015 FINDINGS: Cardiac shadow remains enlarged. Aortic calcifications  are again seen. The lungs are clear bilaterally. Old rib fractures are noted on the left. Scattered large and small bowel gas is noted. Some air-fluid levels are noted in the right mid abdomen. Given the patient's clinical history this may represent some very early partial small bowel obstruction. No colonic dilatation is seen. Degenerative changes of the lumbar spine are noted. IMPRESSION: Few dilated loops of small bowel with air-fluid levels. This may represent an early partial small bowel obstruction given the clinical history. CT imaging may be helpful for further evaluation. Electronically Signed   By: Inez Catalina M.D.   On: 06/30/2016 11:55   Dg Abd Portable 1v  Result Date: 06/30/2016 CLINICAL DATA:  Nasogastric tube placement.  Initial encounter. EXAM: PORTABLE ABDOMEN - 1 VIEW COMPARISON:  Abdominal radiograph performed earlier today at 6:32 p.m.  FINDINGS: The patient's enteric tube is noted ending overlying the body of the stomach. Distention of small and large bowel loops is relatively stable, reflecting the patient's ventral hernia as previously noted. No free intra-abdominal air is seen, though evaluation for free air is limited on a single supine view. No acute osseous abnormalities are identified. IMPRESSION: Enteric tube noted ending overlying the body of the stomach. Electronically Signed   By: Garald Balding M.D.   On: 06/30/2016 23:45    Cardiac Studies   None  Patient Profile     83 y.o. female with PMH of HTN, DM, TIA, HL, breast Ca, and permanent Afib who presented with umbilical hernia with incarceration. Cardiology asked to see for preop evaluation.   Assessment & Plan    1. Permanent Afib: Rate is currently controlled on IV BB while NPO. Low dose Eliquis currently held, can bridge with IV heparin.   2. HTN: Elevated on admission, last reading was low normal. PRN IV hydralazine for elevated pressures  3. Chronic HF: Does not appear volume overloaded on exam. Need to monitor IVF with procedure. Home lasix currently held. Would resume post op.   4. Incarcerated Hernia: Planned for surgery today.  Signed, Reino Bellis, NP  07/01/2016, 9:23 AM

## 2016-07-01 NOTE — Op Note (Signed)
Repair of incarcerated incisional hernia with obstruction  Indications: incarcerated inguinal hernia with obstruction  Pre-operative Diagnosis: Ventral hernia  Post-operative Diagnosis: Ventral hernia  Surgeon: NZDKEU,VHAWU   Assistants: n/a  Anesthesia: General endotracheal anesthesia  ASA Class: 2, E  Procedure Details  The patient was seen in the Holding Room. The risks, benefits, complications, treatment options, and expected outcomes were discussed with the patient. The possibilities of reaction to medication, pulmonary aspiration, perforation of viscus, bleeding, recurrent infection, the need for additional procedures, failure to diagnose a condition, and creating a complication requiring transfusion or operation were discussed with the patient. The patient concurred with the proposed plan, giving informed consent.  The site of surgery properly noted/marked. The patient was taken to the operating room, identified as [name] [name] and the procedure verified as ventral hernia repair with possible bowel resection. A Time Out was held and the above information confirmed.  The patient was placed supine.  After establishing general anesthesia, the abdomen was prepped with Chloraprep and draped in sterile fashion.  We made a vertical incision over the palpable hernia. Dissection was carried down to the hernia sac located above the fascia and mobilized from surrounding structures.   There was small bowel incarcerated in the hernia sac along with omentum. Intact fascia was identified circumferentially around the defect. The hernia sac was entered.  Over 1/2 of the hernia sac had become adherent to the bowel and omentum in a sliding hernia fashion.  This portion was left adherent to the bowel wall.  Adhesions were divided in a way to minimize bowel obstruction.    The fascial defect was closed primarily with running #1 looped PDS sutures x 2.  The subcutaneous tissues were irrigated.  The skin  incision was closed with a skin staples.  Instrument, sponge, and needle counts were correct prior to closure and at the conclusion of the case.   Findings: Incarcerated small bowel and omentum with obstruction.  No evidence of bowel wall compromise.  No other site of obstruction seen.  Pt was relatively oozy considering heparin gtt and eliquis.    Estimated Blood Loss:  less than 100 mL                      Complications:  None; patient tolerated the procedure well.         Disposition: PACU - hemodynamically stable.         Condition: stable

## 2016-07-01 NOTE — Telephone Encounter (Signed)
Pt admitted to hospital and having hernia repair.  Dr. Elease Hashimoto - [facility]ier

## 2016-07-01 NOTE — Progress Notes (Signed)
PROGRESS NOTE  [name] [name] PNN:[id number] DOB: 06/29/1932 DOA: 06/30/2016 PCP: [name], MD  HPI/Recap of past 53 hours: 83 year old female past mental history of atrial fibrillation on eliquis, diabetes mellitus with stage III chronic kidney disease and peripheral neuropathy plus hypothyroidism who had nausea and vomiting times several days and presented to the emergency room on 3/26. The patient did not endorse pain, a telephone encounter from her PCP in the medical record stated that patient called for abdominal pain from her hernia. Emergency room, she was scanned and found to have an incarcerated ventral hernia with markedly high blood pressures. General surgery was consulted and asked for hospitalist to admit patient given multiple medical issues. NG tube placed and patient made nothing by mouth.  Patient seen this morning before surgery. Other than some mild nausea and a little uncomfortable due to NG tube, she is otherwise doing well with no other complaints. No abdominal pain.  Assessment/Plan: Principal Problem:   Recurrent umbilical hernia with incarceration: Continue NG tube. For surgery later today. Active Problems:   Hypothyroidism: On IV Synthroid   Essential hypertension: On IV metoprolol.   Type 2 diabetes mellitus (HCC) uncontrolled with stage III chronic kidney disease and peripheral neuropathy: Last A1c in August was 6.2. On nothing by mouth on low-dose sliding scale every 4 hours. Amaryl held.    Wegener's granulomatosis (granulomatosis with polyangiitis) [facility]): Patient takes 5 mg of prednisone every other day. Go ahead and put her on stress dose steroids (IV Solu-Medrol 4 daily)    Diastolic CHF, chronic (HCC) - normally on low-dose Lasix, ARB & BB along with Norvasc: Monitor daily weights and strict input/output.    Chronic atrial fibrillation (HCC) CHADS2-VASc=6.  : Rate controlled. On IV heparin prior to surgery.    Malnutrition of moderate degree: Patient  meets criteria in the context of acute illness/injury. Nutrition following. Nothing by mouth for now and diet advanced when able to   Code Status: Full code   Family Communication: Discussed with daughter and son-in-law at the bedside   Disposition Plan: Anticipate will be here for the next few days post surgery if uneventful until able to take by mouth    Consultants:  General surgery   Procedures:  Planned incarcerated hernia repair 3/27   Antimicrobials:  IV Ancef for surgery 1 dose   DVT prophylaxis:  IV heparin prior to surgery. Restart once cleared. The meantime SCDs   Objective: Vitals:   06/30/16 2106 07/01/16 0201 07/01/16 0523 07/01/16 1224  BP: (!) 156/77 (!) 141/69 (!) 107/95 (!) 173/81  Pulse: 79  66 70  Resp: _0 Temp: 98.1 F (36.7 C)  98 F (36.7 C) 98.1 F (36.7 C)  TempSrc: Oral  Oral Oral  SpO2: 97%  100% 100%  Weight:      Height:        Intake/Output Summary (Last 24 hours) at 07/01/16 1355 Last data filed at 07/01/16 1200  Gross per 24 hour  Intake          3052.43 ml  Output              850 ml  Net          2202.43 ml   Filed Weights   06/30/16 1055 06/30/16 1725  Weight: 72.6 kg (160 lb) 71.4 kg (157 lb 6.5 oz)    Exam:   General:  Alert and oriented 3, no acute distress   Cardiovascular: Irregular rhythm, rate controlled  Respiratory: Clear to auscultation bilaterally   Abdomen: Soft, minimal tenderness umbilical area, prominent ventral hernia, few bowel sounds   Musculoskeletal: No clubbing or cyanosis or edema   Skin: No skin breaks, tears or lesions  Psychiatry: Patient is appropriate, no evidence of psychoses    Data Reviewed: CBC:  Recent Labs Lab 06/30/16 1129 07/01/16 0256  WBC 7.4 8.3  NEUTROABS 4.7  --   HGB 10.2* 10.0*  HCT 30.6* 30.0*  MCV 91.6 92.9  PLT 211 683   Basic Metabolic Panel:  Recent Labs Lab 06/30/16 1129 07/01/16 0256  NA 136 135  K 4.0 3.7  CL 104 105  CO2 24 21*   GLUCOSE 116* 111*  BUN 23* 19  CREATININE 1.50* 1.19*  CALCIUM 9.1 8.6*   GFR: Estimated Creatinine Clearance: 37.5 mL/min (A) (by C-G formula based on SCr of 1.19 mg/dL (H)). Liver Function Tests:  Recent Labs Lab 06/30/16 1129  AST 16  ALT 9*  ALKPHOS 61  BILITOT 1.5*  PROT 7.8  ALBUMIN 3.8    Recent Labs Lab 06/30/16 1129  LIPASE 30   No results for input(s): AMMONIA in the last 168 hours. Coagulation Profile: No results for input(s): INR, PROTIME in the last 168 hours. Cardiac Enzymes: No results for input(s): CKTOTAL, CKMB, CKMBINDEX, TROPONINI in the last 168 hours. BNP (last 3 results) No results for input(s): PROBNP in the last 8760 hours. HbA1C: No results for input(s): HGBA1C in the last 72 hours. CBG:  Recent Labs Lab 07/01/16 0141 07/01/16 0516 07/01/16 0748 07/01/16 1135 07/01/16 1159  GLUCAP 107* 92 80 67 132*   Lipid Profile: No results for input(s): CHOL, HDL, LDLCALC, TRIG, CHOLHDL, LDLDIRECT in the last 72 hours. Thyroid Function Tests: No results for input(s): TSH, T4TOTAL, FREET4, T3FREE, THYROIDAB in the last 72 hours. Anemia Panel: No results for input(s): VITAMINB12, FOLATE, FERRITIN, TIBC, IRON, RETICCTPCT in the last 72 hours. Urine analysis:    Component Value Date/Time   COLORURINE STRAW (A) 06/30/2016 1126   APPEARANCEUR CLEAR 06/30/2016 1126   LABSPEC 1.009 06/30/2016 1126   PHURINE 6.0 06/30/2016 1126   GLUCOSEU NEGATIVE 06/30/2016 1126   HGBUR SMALL (A) 06/30/2016 1126   BILIRUBINUR NEGATIVE 06/30/2016 1126   BILIRUBINUR n 12/30/2010 1700   KETONESUR 5 (A) 06/30/2016 1126   PROTEINUR NEGATIVE 06/30/2016 1126   UROBILINOGEN 1.0 09/25/2014 1550   NITRITE NEGATIVE 06/30/2016 1126   LEUKOCYTESUR MODERATE (A) 06/30/2016 1126   Sepsis Labs: _0 (procalcitonin:4,lacticidven:4)  ) Recent Results (from the past 240 hour(s))  Surgical pcr screen     Status: None   Collection Time: 06/30/16  6:24 PM  Result Value  Ref Range Status   MRSA, PCR NEGATIVE NEGATIVE Final   Staphylococcus aureus NEGATIVE NEGATIVE Final    Comment:        The Xpert SA Assay (FDA approved for NASAL specimens in patients over 63 years of age), is one component of a comprehensive surveillance program.  Test performance has been validated by [facility] for patients greater than or equal to 7 year old. It is not intended to diagnose infection nor to guide or monitor treatment.       Studies: Ct Abdomen Pelvis Wo Contrast  Result Date: 06/30/2016 CLINICAL DATA:  Increasing size of abdominal wall hernia.  Vomiting. EXAM: CT ABDOMEN AND PELVIS WITHOUT CONTRAST TECHNIQUE: Multidetector CT imaging of the abdomen and pelvis was performed following the standard protocol without IV contrast. COMPARISON:  MRI abdomen 04/18/2013.  PET-CT 03/19/2012 FINDINGS:  Lower chest: Basilar scarring changes without pleural effusion or focal pulmonary lesions. Cardiac enlargement with extensive coronary artery calcifications. Small hiatal hernia. Hepatobiliary: No focal hepatic lesions or intrahepatic biliary dilatation. The gallbladder is surgically absent. Mild stable common bile duct dilatation. Pancreas: Stable sequela of chronic pancreatitis with marked atrophy and cystic change in the pancreatic tail. No findings for acute pancreatitis. No pancreatic ductal dilatation. Spleen: Normal size.  No focal lesions. Adrenals/Urinary Tract: The adrenal glands and kidneys are grossly normal. No renal, ureteral or bladder calculi or mass. Stomach/Bowel: The stomach, duodenum and small bowel are unremarkable. No findings for obstruction or perforation. The terminal ileum is normal. The cecum and ascending colon are dilated and fluid-filled with air-fluid levels. There is also some surrounding inflammation. This is due to a right-sided abdominal wall hernia at a surgical incision site which contains part of the transverse colon. The proximal/entering loop is  obstructed. The transverse colon exiting the hernia is not obstructed. Vascular/Lymphatic: Advanced atherosclerotic calcifications involving the aorta and branch vessels. No aneurysm. No mesenteric or retroperitoneal mass or adenopathy. Reproductive: The uterus and ovaries are unremarkable. Other: No pelvic mass or adenopathy. No free pelvic fluid collections. No inguinal mass or adenopathy. No subcutaneous lesions. Musculoskeletal: No significant bony findings. IMPRESSION: 1. Right-sided lower abdominal wall hernia contains part of the transverse colon. There is evidence of proximal obstruction with dilated cecum and ascending colon with air-fluid level and surrounding inflammation in the omental fat. There is also some inflammation and fluid in the hernia sac. 2. Normal CT appearance of the small bowel. 3. Cardiac enlargement and extensive coronary artery calcifications. 4. Chronic postinflammatory changes involving the pancreas without significant change since prior MRI abdomen from 2015. Electronically Signed   By: Marijo Sanes M.D.   On: 06/30/2016 15:12   Dg Abd 1 View  Result Date: 06/30/2016 CLINICAL DATA:  Gastric tube placement EXAM: ABDOMEN - 1 VIEW COMPARISON:  Same day CT of the abdomen and pelvis FINDINGS: Further advancement of a gastric tube is recommended as the side-port of the tube is seen at the gastroesophageal junction. Mildly distended small bowel loops are seen predominantly left hemiabdomen with moderate gaseous distention of large bowel in the right hemiabdomen. Suture material noted in sagittal plane overlying the mid abdomen. Cholecystectomy clips are seen in right upper quadrant. IMPRESSION: Further advancement of the gastric tube of at least 4-5 cm is recommended as the side-port is noted at the GE junction. Mild to moderate gaseous distention of small bowel loops in the left hemiabdomen. Moderate gaseous distention large bowel in the right hemiabdomen. Findings are likely related  to the noted ventral hernia seen on same day CT. These results will be called to the ordering clinician or representative by the Radiologist Assistant, and communication documented in the PACS or zVision Dashboard. Electronically Signed   By: Ashley Royalty M.D.   On: 06/30/2016 19:10   Dg Abd Portable 1v  Result Date: 06/30/2016 CLINICAL DATA:  Nasogastric tube placement.  Initial encounter. EXAM: PORTABLE ABDOMEN - 1 VIEW COMPARISON:  Abdominal radiograph performed earlier today at 6:32 p.m. FINDINGS: The patient's enteric tube is noted ending overlying the body of the stomach. Distention of small and large bowel loops is relatively stable, reflecting the patient's ventral hernia as previously noted. No free intra-abdominal air is seen, though evaluation for free air is limited on a single supine view. No acute osseous abnormalities are identified. IMPRESSION: Enteric tube noted ending overlying the body of the stomach. Electronically  Signed   By: Garald Balding M.D.   On: 06/30/2016 23:45    Scheduled Meds: . [MAR Hold] cefoTEtan (CEFOTAN) IV  1 g Intravenous Q12H  . [MAR Hold] chlorhexidine  15 mL Mouth/Throat BID  . [MAR Hold] chlorhexidine  15 mL Mouth Rinse BID  . [MAR Hold] insulin aspart  0-9 Units Subcutaneous Q4H  . [MAR Hold] levothyroxine  50 mcg Intravenous Daily  . [MAR Hold] mouth rinse  15 mL Mouth Rinse q12n4p  . [MAR Hold] metoprolol  5 mg Intravenous Q6H    Continuous Infusions: . 0.9 % NaCl with KCl 20 mEq / L 75 mL/hr at 07/01/16 0929  . lactated ringers       LOS: 1 day     Annita Brod, MD [facility] Pager [phone]  If 7PM-7AM, please contact night-coverage [email or web address] Password [email or web address] 07/01/2016, 1:55 PM

## 2016-07-01 NOTE — H&P (View-Only) (Signed)
Reason for Consult: Ventral Hernia Referring Physician:  J Knapp PCP:  BURCHETTE,BRUCE W, MD CARDIOLOGY:  Peter Jordon Pulmonary:  Dr. Clinton Young   CC: Patient reports her ventral hernia starting getting bigger about 3 weeks ago. Over the last week has become hard, she's had issues with nausea and vomiting with by mouth intake, has become progressively more painful. Last BM 06/27/16.  [name] [name] is an 83 y.o. female.   HPI: Followed by Dr. Newman in the past, about 6 months ago a with Ventral hernia. He recommended no surgery if possible.  She called Dr. Burchette today with abdominal pain, unable to eat and sent to the ED.  On presentation to the ED she reports ongoing pain, Ventral hernia is tender,  no nausea or vomiting since yesterday, but has not tried to eat or drink since some crackers yesterday.  Last BM was on Friday.  Work up currently shows Creatinine is 1.5, T bil is up some at 1.5, WBC is normal, H/H 10.2/30.6, platelets are normal. Acute abdominal with chest shows lungs are clear all bilateral rib fractures on the left. Scattered large and small bowel gas is noted. Some air-fluid levels in the mid abdomen no colon dilatation general changes of the spine. Urinalysis shows 6-30 WBC but negative on the nitrates. A CT scan has been ordered. The reading is not back yet, but she does appear to have bowel within the hernia sac. On exam I was not able to reduce it. She had her last doses Xarelto last evening 06/29/16 at 10 PM.  We are asked to see.  Past Medical History:  Diagnosis Date  . Arthritis    r tkr  . Atrial fibrillation (HCC)   . Breast cancer (HCC)   . CVA (cerebral vascular accident) (HCC) 2008   mini stroke.no residual  . DEPRESSION 05/16/2009  . Diabetes mellitus type II   . Heart murmur    stress test 2009.dr peter jordan  . HYPERLIPIDEMIA 07/24/2008  . HYPERTENSION 07/24/2008  . HYPOTHYROIDISM 07/24/2008  . Intermittent vertigo   . Pneumonia   . Skin  abnormality    facial lesions .pt applying mupiracin to areas  . Vertigo   . 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 BREAST SURGERY  2007   Lumpectomy, XRT 2006.l breast  . CHOLECYSTECTOMY  1980  . EXPLORATORY LAPAROTOMY    . HEEL SPUR SURGERY Right   . JOINT REPLACEMENT     r knee  . KNEE SURGERY  2009   TKR  . VIDEO ASSISTED THORACOSCOPY  04/22/2011   Procedure: VIDEO ASSISTED THORACOSCOPY;  Surgeon: Steven C Hendrickson, MD;  Location: [facility] OR;  Service: Thoracic;  Laterality: Left;  WITH BIOPSY    Family History  Problem Relation Age of Onset  . Arthritis Mother   . Rheum arthritis Mother   . Heart disease Father   . Coronary artery disease Father   . Cancer Sister     breast CA, both sisters  . Breast cancer Sister   . Coronary artery disease Brother   . Lung cancer Brother     Social History:  reports that she quit smoking about 34 years ago. Her smoking use included Cigarettes. She has a 6.00 pack-year smoking history. She has never used smokeless tobacco. She reports that she does not drink alcohol or use drugs.  Allergies:  Allergies  Allergen Reactions  . Codeine Sulfate Other (See Comments)      REACTION: GI upset  . Sulfonamide Derivatives Other (See Comments)    REACTION: GI upset  . Vancomycin Other (See Comments)    Red man syndrome  . Atarax [Hydroxyzine] Nausea Only and Rash    Prior to Admission medications   Medication  HOME O@ at [facility]l/[place] 04/16/16 Sig Start Date End Date Taking? Authorizing Provider  amLODipine (NORVASC) 5 MG tablet TAKE ONE TABLET BY MOUTH ONCE DAILY. 05/14/16   Bruce W Burchette, MD  BYSTOLIC 10 MG tablet TAKE ONE TABLET BY MOUTH ONCE DAILY AFTER  BREAKFAST. Patient taking differently: TAKE 10mg TABLET BY MOUTH ONCE DAILY AFTER  BREAKFAST. 11/26/15   Bruce W Burchette, MD  CALCIUM CITRATE PO Take 1,200 mg by mouth daily with lunch.     Historical Provider, MD  cholecalciferol  (VITAMIN D) 1000 UNITS tablet Take 3,000 Units by mouth daily with lunch.     Historical Provider, MD  diazepam (VALIUM) 5 MG tablet Take one tablet by mouth as needed for severe vertigo flares Patient taking differently: Take 5 mg by mouth daily as needed (severe vertigo..).  11/15/15   Bruce W Burchette, MD  ELIQUIS 2.5 MG TABS tablet TAKE ONE TABLET BY MOUTH TWICE DAILY Patient taking differently: TAKE 2.5mg TABLET BY MOUTH TWICE DAILY 02/25/16   Peter M Jordan, MD  escitalopram (LEXAPRO) 10 MG tablet Take 1 tablet (10 mg total) by mouth daily. 11/30/15   Bruce W Burchette, MD  furosemide (LASIX) 20 MG tablet Take 1 tablet (20 mg total) by mouth daily. 04/04/16   Gagan S Lama, MD  gabapentin (NEURONTIN) 100 MG capsule TAKE ONE CAPSULE BY MOUTH TWICE DAILY BEFORE A  MEAL Patient taking differently: TAKE 100mg CAPSULE BY MOUTH TWICE DAILY BEFORE A  MEAL 01/14/16   Bruce W Burchette, MD  glimepiride (AMARYL) 2 MG tablet Take 1 tablet (2 mg total) by mouth daily with breakfast. TAKE ONE TABLET BY MOUTH ONCE DAILY BEFORE BREAKFAST 01/29/16   Bruce W Burchette, MD  guaiFENesin (MUCINEX) 600 MG 12 hr tablet Take 600 mg by mouth 2 (two) times daily as needed for cough or to loosen phlegm.    Historical Provider, MD  levothyroxine (SYNTHROID, LEVOTHROID) 100 MCG tablet Take 1 tablet (100 mcg total) by mouth daily before breakfast. 12/04/15   Bruce W Burchette, MD  loratadine (CLARITIN) 10 MG tablet Take 10 mg by mouth daily as needed for allergies.    Historical Provider, MD  predniSONE (DELTASONE) 5 MG tablet Take 1 tablet (5 mg total) by mouth daily with breakfast. Patient taking differently: Take 5 mg by mouth every other day.  07/18/15   Clinton D Young, MD  triamcinolone cream (KENALOG) 0.1 % Apply 1 application topically 2 (two) times daily as needed. 05/15/15   Bruce W Burchette, MD     Results for orders placed or performed during the hospital encounter of 06/30/16 (from the past 48 hour(s))  CBC WITH  DIFFERENTIAL     Status: Abnormal   Collection Time: 06/30/16 11:29 AM  Result Value Ref Range   WBC 7.4 4.0 - 10.5 K/uL   RBC 3.34 (L) 3.87 - 5.11 MIL/uL   Hemoglobin 10.2 (L) 12.0 - 15.0 g/dL   HCT 30.6 (L) 36.0 - 46.0 %   MCV 91.6 78.0 - 100.0 fL   MCH 30.5 26.0 - 34.0 pg   MCHC 33.3 30.0 - 36.0 g/dL   RDW 13.5 11.5 - 15.5 %   Platelets 211 150 - 400 K/uL   Neutrophils Relative %   64 %   Neutro Abs 4.7 1.7 - 7.7 K/uL   Lymphocytes Relative 22 %   Lymphs Abs 1.6 0.7 - 4.0 K/uL   Monocytes Relative 13 %   Monocytes Absolute 1.0 0.1 - 1.0 K/uL   Eosinophils Relative 1 %   Eosinophils Absolute 0.1 0.0 - 0.7 K/uL   Basophils Relative 0 %   Basophils Absolute 0.0 0.0 - 0.1 K/uL  I-Stat beta hCG blood, ED ([facility], [facility], [facility] only)     Status: Abnormal   Collection Time: 06/30/16 11:40 AM  Result Value Ref Range   I-stat hCG, quantitative 5.0 (H) <5 mIU/mL   Comment 3            Comment:   GEST. AGE      CONC.  (mIU/mL)   <=1 WEEK        5 - 50     2 WEEKS       50 - 500     3 WEEKS       100 - 10,000     4 WEEKS     1,000 - 30,000        FEMALE AND NON-PREGNANT FEMALE:     LESS THAN 5 mIU/mL     Dg Abd Acute W/chest  Result Date: 06/30/2016 CLINICAL DATA:  Increase in hernia size over the last few days with increasing abdominal pain, initial encounter EXAM: DG ABDOMEN ACUTE W/ 1V CHEST COMPARISON:  06/04/2015 FINDINGS: Cardiac shadow remains enlarged. Aortic calcifications are again seen. The lungs are clear bilaterally. Old rib fractures are noted on the left. Scattered large and small bowel gas is noted. Some air-fluid levels are noted in the right mid abdomen. Given the patient's clinical history this may represent some very early partial small bowel obstruction. No colonic dilatation is seen. Degenerative changes of the lumbar spine are noted. IMPRESSION: Few dilated loops of small bowel with air-fluid levels. This may represent an early partial small bowel obstruction given the clinical  history. CT imaging may be helpful for further evaluation. Electronically Signed   By: Mark  Lukens M.D.   On: 06/30/2016 11:55    Review of Systems  Constitutional: Positive for weight loss (40 pounds with change in her diet over the last 4 months.). Negative for chills, diaphoresis, fever and malaise/fatigue.  HENT: Negative.   Eyes: Negative.   Respiratory: Positive for shortness of breath. Negative for cough, hemoptysis, sputum production and wheezing.        She wears oxygen at night chronically for her COPD. She is confined to wheelchair for shopping she says she can't walk now secondary to shortness of breath. She reports she is on chronic prednisone for her COPD.  Cardiovascular: Negative.   Gastrointestinal: Positive for abdominal pain, constipation (No BM since 06/27/16), nausea and vomiting. Negative for blood in stool, diarrhea, heartburn and melena.       Last colonoscopy was about 10 years ago.  Genitourinary: Negative.   Musculoskeletal: Negative.   Skin: Negative.   Neurological: Negative.  Negative for weakness.  Endo/Heme/Allergies: Positive for environmental allergies. Negative for polydipsia. Bruises/bleeds easily (She is on Eliquis, last dose was yesterday around 10 PM).  Psychiatric/Behavioral: Positive for depression.   Blood pressure (!) 170/68, pulse 75, temperature 97.6 F (36.4 C), temperature source Oral, resp. rate 18, height 5' 6" (1.676 m), weight 72.6 kg (160 lb), SpO2 96 %. Physical Exam  Constitutional: She is oriented to person, place, and time. She appears well-developed and well-nourished.   No distress.  HENT:  Head: Normocephalic and atraumatic.  Mouth/Throat: No oropharyngeal exudate.  Eyes: Right eye exhibits no discharge. Left eye exhibits no discharge. No scleral icterus.  Neck: Normal range of motion. Neck supple. No JVD present. No tracheal deviation present. No thyromegaly present.  Cardiovascular: Normal rate, regular rhythm and intact distal  pulses.   Murmur heard. Irregular rhythm Mild diastolic murmurs.  Respiratory: Effort normal. No respiratory distress. She has no wheezes. She has no rales. She exhibits no tenderness.  GI: Soft. She exhibits mass. She exhibits no distension. There is tenderness. There is no rebound and no guarding.  Decreased bowel sounds. She had a umbilical hernia that was a by 5 cm. It was slightly erythematous. It was hard and I could not reduce it. It was painful to palpation and with my attempts to reduce it.  Musculoskeletal: She exhibits no edema or tenderness.  Lymphadenopathy:    She has no cervical adenopathy.  Neurological: She is alert and oriented to person, place, and time. No cranial nerve deficit.  Skin: Skin is warm and dry. No rash noted. She is not diaphoretic. No erythema. No pallor.  Psychiatric: She has a normal mood and affect. Her behavior is normal. Judgment and thought content normal.    Assessment/Plan: Umbilical hernia with incarceration. History exploratory laparotomy and cholecystectomy, 1980s Dr. Lindon Romp History Wegener's disease COPD/on steroids and home O2 Atrial fibrillation on chronic anticoagulation  -  Eliquis last dose 06/29/16  10 PM History CVA Type II Diabetes Hx of left breast cancer with lobectomy and radiation therapy Hypertension Hypothyroidism History of depression  Plan:  Medicine to admit. I would keep her NPO for now. Hold the Eliquis and start heparin as needed. NG tube if she has further nausea or vomiting. I have called cardiology to see. I think she will go the OR in the next 24-48 hours. If it were not for the Eliquis she would most likely go this evening. Dr. Barry Dienes is aware and will see her shortly.     [name] [name] 06/30/2016, 12:00 PM

## 2016-07-01 NOTE — Anesthesia Procedure Notes (Addendum)
Procedure Name: Intubation Date/Time: 07/01/2016 3:03 PM Performed by: Dione Booze Pre-anesthesia Checklist: Emergency Drugs available, Suction available, Patient being monitored and Patient identified Patient Re-evaluated:Patient Re-evaluated prior to inductionOxygen Delivery Method: Circle system utilized Preoxygenation: Pre-oxygenation with 100% oxygen Intubation Type: IV induction, Rapid sequence and Cricoid Pressure applied Laryngoscope Size: Mac and 4 Grade View: Grade II Tube type: Oral Tube size: 7.5 mm Number of attempts: 1 Placement Confirmation: ETT inserted through vocal cords under direct vision,  positive ETCO2 and breath sounds checked- equal and bilateral Secured at: 21 cm Tube secured with: Tape Dental Injury: Teeth and Oropharynx as per pre-operative assessment  Comments: Swollen area proximal to cords. Resembles fluid filled "blister". Existing NGT to suction prior to induction.

## 2016-07-01 NOTE — Progress Notes (Signed)
Initial Nutrition Assessment  DOCUMENTATION CODES:   Non-severe (moderate) malnutrition in context of acute illness/injury  INTERVENTION:  - Diet advancement as medically feasible following surgery.  - RD will continue to monitor for needs.  NUTRITION DIAGNOSIS:   Malnutrition related to acute illness, chronic illness as evidenced by percent weight loss, mild depletion of muscle mass.  GOAL:   Patient will meet greater than or equal to 90% of their needs  MONITOR:   Diet advancement, Weight trends, Labs, I & O's  REASON FOR ASSESSMENT:   Rounds, Diagnosis  ASSESSMENT:   83 y.o. female with medical history of A-fib on Eliquis, DM 2, HTN, Hypothyroid, mod pulm HTN, Wegner's granulomatosis, anxiety, idiopathic peripheral neuropathy & CKD 3. She has been having nausea and vomiting when trying to eat and drink for a few days now. No hematemesis, no BMs. She does admit to abdominal bloating for about the same time. No abdominal pain. However, a note from a CMA from a telephone encounter at [facility] states that patient called for abdominal pain from her hernia which has been severe.  BMI indicates overweight status, appropriate for age. NGT was placed in the ED and has ~400cc drainage at this time. Pt reports last BM was last Friday. She denies N/V PTA although notes in the chart indicate pt was having vomiting with PO intakes PTA. She states last item eaten was 2 slices of pizza for dinner 3/25 and that she kept this down without issue. She denies abdominal pain or nausea at this time but states nausea in the ED following NGT placement and subsequently had 3-4 episodes of emesis. Plan for surgery today.  She states that she was dx with DM "a long time ago" but that about 4 months ago she began eating small meals 3-4 times/day to better control DM. Due to this, she experienced weight loss. She reports that she weighed 195 lbs six months ago and has been losing weight since that  time. Per chart review, pt has lost 12 lbs (7% body weight) in the past 2.5 months; significant for time frame.   Physical assessment shows no fat wasting, mild/moderate muscle wasting to upper and lower body.   Medications reviewed; sliding scale Novolog, 50 mcg IV Synthroid/day. Labs reviewed; CBGs: 80-107 mg/dL this AM, creatinine: 1.19 mg/dL, Ca: 8.6 mg/dL, GFR: 42 mL/min.    IVF: NS-20 mEq @ 75 mL/hr.    Diet Order:  Diet NPO time specified Except for: Ice Chips  Skin:  Reviewed, no issues  Last BM:  PTA (pt reports 3/22)  Height:   Ht Readings from Last 1 Encounters:  06/30/16 5' 6" (1.676 m)    Weight:   Wt Readings from Last 1 Encounters:  06/30/16 157 lb 6.5 oz (71.4 kg)    Ideal Body Weight:  59.09 kg  BMI:  Body mass index is 25.41 kg/m.  Estimated Nutritional Needs:   Kcal:  1640-1860 (23-26 kcal/kg)  Protein:  70-85 grams (1-1.2 grams/kg)  Fluid:  1.8-2 L/day  EDUCATION NEEDS:   No education needs identified at this time    Jarome Matin, MS, RD, LDN, CNSC Inpatient Clinical Dietitian Pager # [phone] After hours/weekend pager # [phone]

## 2016-07-01 NOTE — Anesthesia Postprocedure Evaluation (Signed)
Anesthesia Post Note  Patient: [name] [name]  Procedure(s) Performed: Procedure(s) (LRB): REPAIR OF INCARCERATED INCISIONAL HERNIA  (N/A)  Patient location during evaluation: PACU Anesthesia Type: General Level of consciousness: awake and alert Pain management: pain level controlled Vital Signs Assessment: post-procedure vital signs reviewed and stable Respiratory status: spontaneous breathing, nonlabored ventilation, respiratory function stable and patient connected to nasal cannula oxygen Cardiovascular status: blood pressure returned to baseline and stable Postop Assessment: no signs of nausea or vomiting Anesthetic complications: no       Last Vitals:  Vitals:   07/01/16 0523 07/01/16 1224  BP: (!) 107/95 (!) 173/81  Pulse: 66 70  Resp: 18 19  Temp: 36.7 C 36.7 C    Last Pain:  Vitals:   07/01/16 1224  TempSrc: Oral  PainSc:                  [name] DAVID

## 2016-07-01 NOTE — Progress Notes (Signed)
ANTICOAGULATION CONSULT NOTE - Follow Up Consult  Pharmacy Consult for Heparin Indication: atrial fibrillation  Allergies  Allergen Reactions  . Codeine Sulfate Other (See Comments)    REACTION: GI upset  . Sulfonamide Derivatives Other (See Comments)    REACTION: GI upset  . Vancomycin Other (See Comments)    Red man syndrome  . Atarax [Hydroxyzine] Nausea Only and Rash    Patient Measurements: Height: _0  (167.6 cm) Weight: 157 lb 6.5 oz (71.4 kg) IBW/kg (Calculated) : 59.3 Heparin Dosing Weight: 63 kg  Vital Signs: Temp: 98 F (36.7 C) (03/27 0523) Temp Source: Oral (03/27 0523) BP: 107/95 (03/27 0523) Pulse Rate: 66 (03/27 0523)  Labs:  Recent Labs  06/30/16 1129 06/30/16 2020 07/01/16 0256  HGB 10.2*  --  10.0*  HCT 30.6*  --  30.0*  PLT 211  --  223  APTT  --  36 38*  HEPARINUNFRC  --  1.01* 0.85*  CREATININE 1.50*  --  1.19*    Estimated Creatinine Clearance: 37.5 mL/min (A) (by C-G formula based on SCr of 1.19 mg/dL (H)).   Assessment: 83 yr female presents with c/o nausea/vomiting and abdominal pain from her hernia.  PMH significant for AFib (on Eliquis PTA), DM, HTN, hypothyroidism, CKD Stage 3.  Pharmacy consulted to dose IV heparin for AFib in anticipation of upcoming surgical repair of hernia.  PTA Eliquis 2.51m po BID with last dose 3/25. Heparin started 3/26 PM.  Surgery scheduled for this afternoon. Heparin stopped this morning per surgeon request.  Goal of Therapy:  Heparin level 0.3-0.7 units/ml  APTT goal: 66-102 sec Monitor platelets by anticoagulation protocol: Yes   Plan:  F/u for resumption of anticoagulation following surgery this afternoon.  RHershal Coria PharmD 07/01/2016,8:18 AM

## 2016-07-01 NOTE — Progress Notes (Signed)
Pharmacy - IV heparin  Assessment:    Please see note from Hershal Coria, PharmD earlier today for full details.  Briefly, 83 y.o. female on IV heparin for Afib; currently on hold while patient in surgery for ventral hernia repair. Awaiting instructions for resumption of heparin after procedure   Per Surgery, resume heparin at 10p tonight  Plan:   Resume heparin at 1200 units/hr starting at 10p tonight.  Check heparin level/aPTT in 8 hrs  Reuel Boom, PharmD, BCPS Pager: [phone] 07/01/2016, 2:45 PM

## 2016-07-01 NOTE — Transfer of Care (Signed)
Immediate Anesthesia Transfer of Care Note  Patient: [name] [name]  Procedure(s) Performed: Procedure(s): REPAIR OF INCARCERATED INCISIONAL HERNIA  (N/A)  Patient Location: PACU  Anesthesia Type:General  Level of Consciousness: awake, alert , oriented and patient cooperative  Airway & Oxygen Therapy: Patient Spontanous Breathing and Patient connected to face mask oxygen  Post-op Assessment: Report given to RN and Post -op Vital signs reviewed and stable  Post vital signs: Reviewed and stable  Last Vitals:  Vitals:   07/01/16 0523 07/01/16 1224  BP: (!) 107/95 (!) 173/81  Pulse: 66 70  Resp: 18 19  Temp: 36.7 C 36.7 C    Last Pain:  Vitals:   07/01/16 1224  TempSrc: Oral  PainSc:       Patients Stated Pain Goal: 3 (00/52/59 1028)  Complications: No apparent anesthesia complications

## 2016-07-01 NOTE — Progress Notes (Signed)
Hypoglycemic Event  CBG:67  Treatment: D50 IV 25 mL  Symptoms: None  Follow-up CBG: Time:1159 CBG Result:132  Possible Reasons for Event: Other: NPO  Comments/MD notified:    Stacey Drain

## 2016-07-01 NOTE — Interval H&P Note (Signed)
History and Physical Interval Note:  07/01/2016 2:04 PM  [name] [name]  has presented today for surgery, with the diagnosis of incarcerated ventral hernia  The various methods of treatment have been discussed with the patient and family. After consideration of risks, benefits and other options for treatment, the patient has consented to  Procedure(s): HERNIA REPAIR VENTRAL ADULT (N/A) as a surgical intervention .  The patient's history has been reviewed, patient examined, no change in status, stable for surgery.  I have reviewed the patient's chart and labs.  Questions were answered to the patient's satisfaction.     Anvita Hirata

## 2016-07-02 ENCOUNTER — Encounter (HOSPITAL_COMMUNITY): Payer: Self-pay | Admitting: General Surgery

## 2016-07-02 DIAGNOSIS — I10 Essential (primary) hypertension: Secondary | ICD-10-CM

## 2016-07-02 LAB — GLUCOSE, CAPILLARY
GLUCOSE-CAPILLARY: 158 mg/dL — AB (ref 65–99)
Glucose-Capillary: 139 mg/dL — ABNORMAL HIGH (ref 65–99)
Glucose-Capillary: 146 mg/dL — ABNORMAL HIGH (ref 65–99)
Glucose-Capillary: 166 mg/dL — ABNORMAL HIGH (ref 65–99)
Glucose-Capillary: 176 mg/dL — ABNORMAL HIGH (ref 65–99)

## 2016-07-02 LAB — CBC
HEMATOCRIT: 31.1 % — AB (ref 36.0–46.0)
HEMOGLOBIN: 10.6 g/dL — AB (ref 12.0–15.0)
MCH: 31.9 pg (ref 26.0–34.0)
MCHC: 34.1 g/dL (ref 30.0–36.0)
MCV: 93.7 fL (ref 78.0–100.0)
Platelets: 214 10*3/uL (ref 150–400)
RBC: 3.32 MIL/uL — ABNORMAL LOW (ref 3.87–5.11)
RDW: 13.7 % (ref 11.5–15.5)
WBC: 9.3 10*3/uL (ref 4.0–10.5)

## 2016-07-02 LAB — APTT: APTT: 73 s — AB (ref 24–36)

## 2016-07-02 LAB — HEPARIN LEVEL (UNFRACTIONATED): Heparin Unfractionated: 1.07 IU/mL — ABNORMAL HIGH (ref 0.30–0.70)

## 2016-07-02 LAB — HEMOGLOBIN A1C
Hgb A1c MFr Bld: 6 % — ABNORMAL HIGH (ref 4.8–5.6)
Mean Plasma Glucose: 126 mg/dL

## 2016-07-02 MED ORDER — ACETAMINOPHEN 10 MG/ML IV SOLN
1000.0000 mg | Freq: Four times a day (QID) | INTRAVENOUS | Status: AC
  Administered 2016-07-02 – 2016-07-03 (×4): 1000 mg via INTRAVENOUS
  Filled 2016-07-02 (×4): qty 100

## 2016-07-02 MED ORDER — METOPROLOL TARTRATE 5 MG/5ML IV SOLN
5.0000 mg | INTRAVENOUS | Status: DC
Start: 2016-07-02 — End: 2016-07-04
  Administered 2016-07-02 – 2016-07-04 (×11): 5 mg via INTRAVENOUS
  Filled 2016-07-02 (×11): qty 5

## 2016-07-02 NOTE — Progress Notes (Signed)
PROGRESS NOTE  [name] [name] TDV:[id number] DOB: 11/18/1932 DOA: 06/30/2016 PCP: [name], MD  HPI/Recap of past 36 hours: 83 year old female past mental history of atrial fibrillation on eliquis, diabetes mellitus with stage III chronic kidney disease and peripheral neuropathy plus hypothyroidism who had nausea and vomiting times several days and presented to the emergency room on 3/26. The patient did not endorse pain, a telephone encounter from her PCP in the medical record stated that patient called for abdominal pain from her hernia. Emergency room, she was scanned and found to have an incarcerated ventral hernia with markedly high blood pressures. General surgery was consulted and asked for hospitalist to admit patient given multiple medical issues. NG tube placed and patient made nothing by mouth, taken for surgery 3/27 and has had uncomplicated postop course thus far.   Abdominal pain is controlled, not passing flatus yet. No other complaints. Denies chest pain, dyspnea, palpitations.   Assessment/Plan: Recurrent umbilical hernia with incarceration: s/p repair 3/27.  - Per general surgery, still with NGT but decreased output.  - NPO - Will DC foley and encourage mobilization today  Hypothyroidism:  - On IV Synthroid    Essential hypertension:  - On IV metoprolol, increasing frequency today.   Type 2 diabetes mellitus (HCC) uncontrolled with stage III chronic kidney disease and peripheral neuropathy: Last A1c in August was 6.2.  - On low-dose SSI q4h while NPO. Amaryl held.  Wegener's granulomatosis (granulomatosis with polyangiitis) ([place]):  - Giving stress IV steroids (for home prednisone 20m daily)  Diastolic CHF, chronic (HCC) - normally on low-dose Lasix, ARB & BB along with Norvasc:  - Monitor daily weights and strict input/output. - Appreciate cardiology input.   Chronic atrial fibrillation (HCC) CHADS2-VASc=6. Rate controlled. - Continue heparin gtt while  NPO - Cardiology increasing beta blocker frequency due to escalating BP.  Malnutrition of moderate degree: Patient meets criteria in the context of acute illness/injury.  - Nutrition following.  - Nothing by mouth for now and diet advanced when able to  Code Status: Full code   Family Communication: Discussed with daughter and son-in-law at the bedside   Disposition Plan: Anticipate will be here for the next few days post surgery if uneventful until able to take by mouth   Consultants:  General surgery   Cardiology  Procedures:  Incarcerated hernia repair 3/27   Antimicrobials:  IV Ancef for surgery 1 dose   DVT prophylaxis: Heparin gtt   Objective: Vitals:   07/01/16 2102 07/02/16 0041 07/02/16 0500 07/02/16 1455  BP: (!) 154/90 (!) 161/77 (!) 158/81 (!) 174/66  Pulse: 81 70 77 77  Resp: _0 Temp: 97.7 F (36.5 C) 98.2 F (36.8 C) 98 F (36.7 C) 97.9 F (36.6 C)  TempSrc: Oral Axillary Axillary Oral  SpO2: 100% 100% 100% 96%  Weight:   71.3 kg (157 lb 3 oz)   Height:        Intake/Output Summary (Last 24 hours) at 07/02/16 1558 Last data filed at 07/02/16 1457  Gross per 24 hour  Intake           2440.4 ml  Output              875 ml  Net           1565.4 ml   Filed Weights   06/30/16 1055 06/30/16 1725 07/02/16 0500  Weight: 72.6 kg (160 lb) 71.4 kg (157 lb 6.5 oz) 71.3 kg (157 lb 3 oz)  Exam:   General:  83yo F in no distress  Cardiovascular: Irregular rhythm, rate controlled. I/VI early systolic murmur. No JVD.    Respiratory: Nonlabored, clear to auscultation bilaterally   Abdomen: Soft, mild appropriate diffuse abdominal tenderness hypoactive BS, some transmitted sounds from NGT. Umbilical incision site c/d/i.   Musculoskeletal: No clubbing or cyanosis or edema   Skin: No other rashes noted.  Psychiatry: Patient is appropriate, no evidence of psychoses    Data Reviewed: CBC:  Recent Labs Lab 06/30/16 1129  07/01/16 0256 07/02/16 0450  WBC 7.4 8.3 9.3  NEUTROABS 4.7  --   --   HGB 10.2* 10.0* 10.6*  HCT 30.6* 30.0* 31.1*  MCV 91.6 92.9 93.7  PLT 211 223 790   Basic Metabolic Panel:  Recent Labs Lab 06/30/16 1129 07/01/16 0256  NA 136 135  K 4.0 3.7  CL 104 105  CO2 24 21*  GLUCOSE 116* 111*  BUN 23* 19  CREATININE 1.50* 1.19*  CALCIUM 9.1 8.6*   GFR: Estimated Creatinine Clearance: 37.5 mL/min (A) (by C-G formula based on SCr of 1.19 mg/dL (H)). Liver Function Tests:  Recent Labs Lab 06/30/16 1129  AST 16  ALT 9*  ALKPHOS 61  BILITOT 1.5*  PROT 7.8  ALBUMIN 3.8    Recent Labs Lab 06/30/16 1129  LIPASE 30   No results for input(s): AMMONIA in the last 168 hours. Coagulation Profile: No results for input(s): INR, PROTIME in the last 168 hours. Cardiac Enzymes: No results for input(s): CKTOTAL, CKMB, CKMBINDEX, TROPONINI in the last 168 hours. BNP (last 3 results) No results for input(s): PROBNP in the last 8760 hours. HbA1C:  Recent Labs  06/30/16 1951  HGBA1C 6.0*   CBG:  Recent Labs Lab 07/01/16 2059 07/02/16 0040 07/02/16 0505 07/02/16 0804 07/02/16 1128  GLUCAP 136* 176* 158* 166* 146*   Lipid Profile: No results for input(s): CHOL, HDL, LDLCALC, TRIG, CHOLHDL, LDLDIRECT in the last 72 hours. Thyroid Function Tests: No results for input(s): TSH, T4TOTAL, FREET4, T3FREE, THYROIDAB in the last 72 hours. Anemia Panel: No results for input(s): VITAMINB12, FOLATE, FERRITIN, TIBC, IRON, RETICCTPCT in the last 72 hours. Urine analysis:    Component Value Date/Time   COLORURINE STRAW (A) 06/30/2016 1126   APPEARANCEUR CLEAR 06/30/2016 1126   LABSPEC 1.009 06/30/2016 1126   PHURINE 6.0 06/30/2016 1126   GLUCOSEU NEGATIVE 06/30/2016 1126   HGBUR SMALL (A) 06/30/2016 1126   BILIRUBINUR NEGATIVE 06/30/2016 1126   BILIRUBINUR n 12/30/2010 1700   KETONESUR 5 (A) 06/30/2016 1126   PROTEINUR NEGATIVE 06/30/2016 1126   UROBILINOGEN 1.0 09/25/2014  1550   NITRITE NEGATIVE 06/30/2016 1126   LEUKOCYTESUR MODERATE (A) 06/30/2016 1126   Sepsis Labs: _0 (procalcitonin:4,lacticidven:4)  ) Recent Results (from the past 240 hour(s))  Surgical pcr screen     Status: None   Collection Time: 06/30/16  6:24 PM  Result Value Ref Range Status   MRSA, PCR NEGATIVE NEGATIVE Final   Staphylococcus aureus NEGATIVE NEGATIVE Final    Comment:        The Xpert SA Assay (FDA approved for NASAL specimens in patients over 76 years of age), is one component of a comprehensive surveillance program.  Test performance has been validated by [facility] for patients greater than or equal to 56 year old. It is not intended to diagnose infection nor to guide or monitor treatment.       Studies: No results found.  Scheduled Meds: . acetaminophen  1,000 mg Intravenous Q6H  .  chlorhexidine  15 mL Mouth/Throat BID  . chlorhexidine  15 mL Mouth Rinse BID  . insulin aspart  0-9 Units Subcutaneous Q4H  . levothyroxine  50 mcg Intravenous Daily  . mouth rinse  15 mL Mouth Rinse q12n4p  . methylPREDNISolone (SOLU-MEDROL) injection  4 mg Intravenous Q24H  . metoprolol  5 mg Intravenous Q4H    Continuous Infusions: . 0.9 % NaCl with KCl 20 mEq / L 75 mL/hr at 07/02/16 0214  . heparin 1,200 Units/hr (07/01/16 2228)    LOS: 2 days   Vance Gather, MD [facility] Pager [phone]   If 7PM-7AM, please contact night-coverage [email or web address] Password [facility] 07/02/2016, 3:58 PM

## 2016-07-02 NOTE — Progress Notes (Signed)
ANTICOAGULATION CONSULT NOTE - Follow Up Consult  Pharmacy Consult for Heparin Indication: atrial fibrillation  Allergies  Allergen Reactions  . Codeine Sulfate Other (See Comments)    REACTION: GI upset  . Sulfonamide Derivatives Other (See Comments)    REACTION: GI upset  . Vancomycin Other (See Comments)    Red man syndrome  . Atarax [Hydroxyzine] Nausea Only and Rash    Patient Measurements: Height: _0  (167.6 cm) Weight: 157 lb 3 oz (71.3 kg) IBW/kg (Calculated) : 59.3 Heparin Dosing Weight: 63 kg  Vital Signs: Temp: 98 F (36.7 C) (03/28 0500) Temp Source: Axillary (03/28 0500) BP: 158/81 (03/28 0500) Pulse Rate: 77 (03/28 0500)  Labs:  Recent Labs  06/30/16 1129 06/30/16 2020 07/01/16 0256 07/02/16 0450 07/02/16 0812  HGB 10.2*  --  10.0* 10.6*  --   HCT 30.6*  --  30.0* 31.1*  --   PLT 211  --  223 214  --   APTT  --  36 38*  --  73*  HEPARINUNFRC  --  1.01* 0.85*  --  1.07*  CREATININE 1.50*  --  1.19*  --   --     Estimated Creatinine Clearance: 37.5 mL/min (A) (by C-G formula based on SCr of 1.19 mg/dL (H)).   Assessment: 83 yr female presents with c/o nausea/vomiting and abdominal pain from her hernia.  PMH significant for AFib (on Eliquis PTA), DM, HTN, hypothyroidism, CKD Stage 3.  Pharmacy consulted to dose IV heparin for AFib in anticipation of upcoming surgical repair of hernia.  PTA Eliquis 2.77m po BID with last dose 3/25. Heparin started 3/26 PM. Heparin off 3/27 for hernia repair - resumed3/27 at 2230 at rate of 1200 units/hr.  Today, 07/02/2016: Heparin resumed post-op last night at 2230 at rate of 1200 units/hr. Heparin level elevated at 1.07 due to prior apixaban.  APTT therapeutic at 73 seconds.  CBC stable, no bleeding reported. Still NPO with NG tube in.   Goal of Therapy:  Heparin level 0.3-0.7 units/ml  APTT goal: 66-102 sec Monitor platelets by anticoagulation protocol: Yes   Plan:  Continue heparin at 1200 units/hr Daily  aPTT/HL and CBC f/u GI function for ability to transition back to home Eliquis  MEudelia Bunch Pharm.D. [phone]/28/2018 12:40 PM

## 2016-07-02 NOTE — Progress Notes (Signed)
1 Day Post-Op  Subjective: She looks pretty good this a.m. NG is in, I changed the filter and it's working. They're going to remove her Foley shortly and I'll start mobilizing her at that time.  Objective: Vital signs in last 24 hours: Temp:  [97.4 F (36.3 C)-98.2 F (36.8 C)] 98 F (36.7 C) (03/28 0500) Pulse Rate:  [70-85] 77 (03/28 0500) Resp:  [13-27] 16 (03/28 0500) BP: (154-194)/(66-94) 158/81 (03/28 0500) SpO2:  [100 %] 100 % (03/28 0500) Weight:  [71.3 kg (157 lb 3 oz)] 71.3 kg (157 lb 3 oz) (03/28 0500) Last BM Date: 06/28/16 Npo 2950 IV 725 urine 350 MG 200 blood Afebrile vital signs are stable. CBCs was normal this a.m. Glucoses running from 119 to 166.   Intake/Output from previous day: 03/27 0701 - 03/28 0700 In: 2955 [I.V.:2935; NG/GT:20] Out: 1275 [Urine:725; Emesis/NG output:350; Blood:200] Intake/Output this shift: No intake/output data recorded.  General appearance: alert, cooperative and no distress GI: Soft, sore, not having a lot of pain. Waffle dressing in place, site looks good.  Lab Results:   Recent Labs  07/01/16 0256 07/02/16 0450  WBC 8.3 9.3  HGB 10.0* 10.6*  HCT 30.0* 31.1*  PLT 223 214    BMET  Recent Labs  06/30/16 1129 07/01/16 0256  NA 136 135  K 4.0 3.7  CL 104 105  CO2 24 21*  GLUCOSE 116* 111*  BUN 23* 19  CREATININE 1.50* 1.19*  CALCIUM 9.1 8.6*   PT/INR No results for input(s): LABPROT, INR in the last 72 hours.   Recent Labs Lab 06/30/16 1129  AST 16  ALT 9*  ALKPHOS 61  BILITOT 1.5*  PROT 7.8  ALBUMIN 3.8     Lipase     Component Value Date/Time   LIPASE 30 06/30/2016 1129     Studies/Results: Ct Abdomen Pelvis Wo Contrast  Result Date: 06/30/2016 CLINICAL DATA:  Increasing size of abdominal wall hernia.  Vomiting. EXAM: CT ABDOMEN AND PELVIS WITHOUT CONTRAST TECHNIQUE: Multidetector CT imaging of the abdomen and pelvis was performed following the standard protocol without IV contrast.  COMPARISON:  MRI abdomen 04/18/2013.  PET-CT 03/19/2012 FINDINGS: Lower chest: Basilar scarring changes without pleural effusion or focal pulmonary lesions. Cardiac enlargement with extensive coronary artery calcifications. Small hiatal hernia. Hepatobiliary: No focal hepatic lesions or intrahepatic biliary dilatation. The gallbladder is surgically absent. Mild stable common bile duct dilatation. Pancreas: Stable sequela of chronic pancreatitis with marked atrophy and cystic change in the pancreatic tail. No findings for acute pancreatitis. No pancreatic ductal dilatation. Spleen: Normal size.  No focal lesions. Adrenals/Urinary Tract: The adrenal glands and kidneys are grossly normal. No renal, ureteral or bladder calculi or mass. Stomach/Bowel: The stomach, duodenum and small bowel are unremarkable. No findings for obstruction or perforation. The terminal ileum is normal. The cecum and ascending colon are dilated and fluid-filled with air-fluid levels. There is also some surrounding inflammation. This is due to a right-sided abdominal wall hernia at a surgical incision site which contains part of the transverse colon. The proximal/entering loop is obstructed. The transverse colon exiting the hernia is not obstructed. Vascular/Lymphatic: Advanced atherosclerotic calcifications involving the aorta and branch vessels. No aneurysm. No mesenteric or retroperitoneal mass or adenopathy. Reproductive: The uterus and ovaries are unremarkable. Other: No pelvic mass or adenopathy. No free pelvic fluid collections. No inguinal mass or adenopathy. No subcutaneous lesions. Musculoskeletal: No significant bony findings. IMPRESSION: 1. Right-sided lower abdominal wall hernia contains part of the transverse colon. There  is evidence of proximal obstruction with dilated cecum and ascending colon with air-fluid level and surrounding inflammation in the omental fat. There is also some inflammation and fluid in the hernia sac. 2.  Normal CT appearance of the small bowel. 3. Cardiac enlargement and extensive coronary artery calcifications. 4. Chronic postinflammatory changes involving the pancreas without significant change since prior MRI abdomen from 2015. Electronically Signed   By: Marijo Sanes M.D.   On: 06/30/2016 15:12   Dg Abd 1 View  Result Date: 06/30/2016 CLINICAL DATA:  Gastric tube placement EXAM: ABDOMEN - 1 VIEW COMPARISON:  Same day CT of the abdomen and pelvis FINDINGS: Further advancement of a gastric tube is recommended as the side-port of the tube is seen at the gastroesophageal junction. Mildly distended small bowel loops are seen predominantly left hemiabdomen with moderate gaseous distention of large bowel in the right hemiabdomen. Suture material noted in sagittal plane overlying the mid abdomen. Cholecystectomy clips are seen in right upper quadrant. IMPRESSION: Further advancement of the gastric tube of at least 4-5 cm is recommended as the side-port is noted at the GE junction. Mild to moderate gaseous distention of small bowel loops in the left hemiabdomen. Moderate gaseous distention large bowel in the right hemiabdomen. Findings are likely related to the noted ventral hernia seen on same day CT. These results will be called to the ordering clinician or representative by the Radiologist Assistant, and communication documented in the PACS or zVision Dashboard. Electronically Signed   By: Ashley Royalty M.D.   On: 06/30/2016 19:10   Dg Abd Acute W/chest  Result Date: 06/30/2016 CLINICAL DATA:  Increase in hernia size over the last few days with increasing abdominal pain, initial encounter EXAM: DG ABDOMEN ACUTE W/ 1V CHEST COMPARISON:  06/04/2015 FINDINGS: Cardiac shadow remains enlarged. Aortic calcifications are again seen. The lungs are clear bilaterally. Old rib fractures are noted on the left. Scattered large and small bowel gas is noted. Some air-fluid levels are noted in the right mid abdomen. Given the  patient's clinical history this may represent some very early partial small bowel obstruction. No colonic dilatation is seen. Degenerative changes of the lumbar spine are noted. IMPRESSION: Few dilated loops of small bowel with air-fluid levels. This may represent an early partial small bowel obstruction given the clinical history. CT imaging may be helpful for further evaluation. Electronically Signed   By: Inez Catalina M.D.   On: 06/30/2016 11:55   Dg Abd Portable 1v  Result Date: 06/30/2016 CLINICAL DATA:  Nasogastric tube placement.  Initial encounter. EXAM: PORTABLE ABDOMEN - 1 VIEW COMPARISON:  Abdominal radiograph performed earlier today at 6:32 p.m. FINDINGS: The patient's enteric tube is noted ending overlying the body of the stomach. Distention of small and large bowel loops is relatively stable, reflecting the patient's ventral hernia as previously noted. No free intra-abdominal air is seen, though evaluation for free air is limited on a single supine view. No acute osseous abnormalities are identified. IMPRESSION: Enteric tube noted ending overlying the body of the stomach. Electronically Signed   By: Garald Balding M.D.   On: 06/30/2016 23:45    Medications: . cefoTEtan (CEFOTAN) IV  1 g Intravenous Q12H  . chlorhexidine  15 mL Mouth/Throat BID  . chlorhexidine  15 mL Mouth Rinse BID  . insulin aspart  0-9 Units Subcutaneous Q4H  . levothyroxine  50 mcg Intravenous Daily  . mouth rinse  15 mL Mouth Rinse q12n4p  . methylPREDNISolone (SOLU-MEDROL) injection  4  mg Intravenous Q24H  . metoprolol  5 mg Intravenous Q6H   . 0.9 % NaCl with KCl 20 mEq / L 75 mL/hr at 07/02/16 0214  . heparin 1,200 Units/hr (07/01/16 2228)   Assessment/Plan Incarcerated incisional hernia with obstruction S/P repair of incarcerated incisional hernia with obstruction 07/01/16, Dr. Stark Klein History exploratory laparotomy and cholecystectomy, 1980s Dr. Lindon Romp History Wegener's disease COPD/on steroids and  home O2 Atrial fibrillation on chronic anticoagulation  -  Eliquis last dose 06/29/16  10 PM History CVA Type II Diabetes Hx of left breast cancer with lobectomy and radiation therapy Hypertension Hypothyroidism History of depression  FEN: IV fluids/npo ID: Cefotetan day 2 DVT: Heparin drip  Plan: We'll start mobilizing her today. Foley speed removed this a.m. Continue NG suction, and IV hydration for now. I've added IV Tylenol for pain control.   LOS: 2 days    Dunia Pringle 07/02/2016 [phone]

## 2016-07-02 NOTE — Progress Notes (Signed)
Noted  bright red blood oozing from the surgical site was noted after movement, Dr Johney Maine notified and informed that patient is also on heparin drip. MD stated to reinforce dressing; will continue to monitor patient.

## 2016-07-02 NOTE — Evaluation (Signed)
Physical Therapy Evaluation Patient Details Name: [name] [name] MRN: [id number] DOB: June 24, 1932 Today's Date: 07/02/2016   History of Present Illness  83 yo female s/p hernia repair 3/27. Hx of COPD, vertigo, DM, CVA, HTN, Afib, cellulitis  Clinical Impression  On eval, pt required Min assist for mobility. She was able to pivot x 2 with a RW. Pt reports mod pain with activity. Will follow and progress activity as tolerated. Recommend HHPT and 24 hour supervision/assist depending on progress.     Follow Up Recommendations Home health PT;Supervision/Assistance - 24 hour    Equipment Recommendations  None recommended by PT    Recommendations for Other Services OT consult     Precautions / Restrictions Precautions Precautions: Fall Precaution Comments: NG tube Restrictions Weight Bearing Restrictions: No      Mobility  Bed Mobility Overal bed mobility: Needs Assistance Bed Mobility: Sit to Supine       Sit to supine: HOB elevated;Min assist   General bed mobility comments: Assist for LEs. Increased time.   Transfers Overall transfer level: Needs assistance Equipment used: Rolling walker (2 wheeled) Transfers: Sit to/from [facility] Sit to Stand: Min assist Stand pivot transfers: Min assist       General transfer comment: Assist to rise, stabilize, control descent. Increased time. VCs safety, hand placement. Stand pivot x2, recliner>bsc, then bsc>bed with rW.   Ambulation/Gait Ambulation/Gait assistance: Min assist           General Gait Details: NT on today  Stairs            Wheelchair Mobility    Modified Rankin (Stroke Patients Only)       Balance                                             Pertinent Vitals/Pain Pain Assessment: Faces Faces Pain Scale: Hurts even more Pain Location: abdomen Pain Descriptors / Indicators: Sore;Sharp Pain Intervention(s): Limited activity within patient's  tolerance;Repositioned    Home Living Family/patient expects to be discharged to:: Private residence Living Arrangements: Spouse/significant other (husband has dementia) Available Help at Discharge: Family Type of Home: House Home Access: Stairs to enter Entrance Stairs-Rails: Psychiatric nurse of Steps: "a few" Home Layout: One level Home Equipment: Walker - 2 wheels;Walker - 4 wheels;Wheelchair - manual      Prior Function Level of Independence: Independent with assistive device(s)         Comments: RW for ambulation     Hand Dominance        Extremity/Trunk Assessment   Upper Extremity Assessment Upper Extremity Assessment: Generalized weakness    Lower Extremity Assessment Lower Extremity Assessment: Generalized weakness    Cervical / Trunk Assessment Cervical / Trunk Assessment: Normal  Communication   Communication: No difficulties  Cognition Arousal/Alertness: Awake/alert Behavior During Therapy: WFL for tasks assessed/performed Overall Cognitive Status: Within Functional Limits for tasks assessed                                        General Comments      Exercises     Assessment/Plan    PT Assessment Patient needs continued PT services  PT Problem List Decreased strength;Decreased mobility;Decreased balance;Decreased activity tolerance;Pain;Decreased knowledge of use of DME  PT Treatment Interventions DME instruction;Gait training;Therapeutic activities;Therapeutic exercise;Patient/family education;Balance training;Functional mobility training    PT Goals (Current goals can be found in the Care Plan section)  Acute Rehab PT Goals Patient Stated Goal: regain PLOF PT Goal Formulation: With patient Time For Goal Achievement: 07/16/16 Potential to Achieve Goals: Good    Frequency Min 3X/week   Barriers to discharge Decreased caregiver support husband has dementia.     Co-evaluation                End of Session   Activity Tolerance: Patient limited by fatigue;Patient limited by pain Patient left: in bed;with call bell/phone within reach;with bed alarm set   PT Visit Diagnosis: Muscle weakness (generalized) (M62.81);Difficulty in walking, not elsewhere classified (R26.2)    Time: 1419-1440 PT Time Calculation (min) (ACUTE ONLY): 21 min   Charges:   PT Evaluation $PT Eval Low Complexity: 1 Procedure     PT G Codes:         Weston Anna, MPT Pager: [phone]

## 2016-07-02 NOTE — Progress Notes (Signed)
Patient continue to bleed from surgical site, surgical dressing all covered with blood, blood over bed/gown and blanket. Dr Johney Maine notified again and stated to hold heparin drip until re-assessed by cardiology/surgery team in AM. Pharmacy, Kirby-NP and cardiology-Dr. Koleen Nimrod on call also informed, will continue to assess patient.

## 2016-07-02 NOTE — Progress Notes (Signed)
Progress Note  Patient Name: [name] [name] Date of Encounter: 07/02/2016  Primary Cardiologist: [place]  Subjective   Feeling well this morning. Minimal abd pain post op.  Just a bit groggy  Inpatient Medications    Scheduled Meds: . cefoTEtan (CEFOTAN) IV  1 g Intravenous Q12H  . chlorhexidine  15 mL Mouth/Throat BID  . chlorhexidine  15 mL Mouth Rinse BID  . insulin aspart  0-9 Units Subcutaneous Q4H  . levothyroxine  50 mcg Intravenous Daily  . mouth rinse  15 mL Mouth Rinse q12n4p  . methylPREDNISolone (SOLU-MEDROL) injection  4 mg Intravenous Q24H  . metoprolol  5 mg Intravenous Q6H   Continuous Infusions: . 0.9 % NaCl with KCl 20 mEq / L 75 mL/hr at 07/02/16 0214  . heparin 1,200 Units/hr (07/01/16 2228)   PRN Meds: acetaminophen **OR** acetaminophen, diazepam, hydrALAZINE, morphine injection, ondansetron **OR** ondansetron (ZOFRAN) IV, prochlorperazine   Vital Signs    Vitals:   07/01/16 1730 07/01/16 2102 07/02/16 0041 07/02/16 0500  BP: (!) 176/94 (!) 154/90 (!) 161/77 (!) 158/81  Pulse: 85 81 70 77  Resp: (!) _0 Temp: 97.8 F (36.6 C) 97.7 F (36.5 C) 98.2 F (36.8 C) 98 F (36.7 C)  TempSrc:  Oral Axillary Axillary  SpO2: 100% 100% 100% 100%  Weight:    157 lb 3 oz (71.3 kg)  Height:        Intake/Output Summary (Last 24 hours) at 07/02/16 0829 Last data filed at 07/02/16 0600  Gross per 24 hour  Intake             2955 ml  Output             1275 ml  Net             1680 ml   Filed Weights   06/30/16 1055 06/30/16 1725 07/02/16 0500  Weight: 160 lb (72.6 kg) 157 lb 6.5 oz (71.4 kg) 157 lb 3 oz (71.3 kg)    Telemetry    Afib (controlled) - Personally Reviewed  ECG    N/A - Personally Reviewed  Physical Exam   General: Well developed, well nourished, female appearing in no acute distress. Head: Normocephalic, atraumatic.  Neck: Supple without bruits, JVD. Lungs:  Resp regular and unlabored, CTA. Heart: Irreg Irreg, S1,  S2, no S3, S4, soft systolic murmur; no rub. Abdomen: Soft, non-tender, non-distended with normoactive bowel sounds. No hepatomegaly. No rebound/guarding. No obvious abdominal masses. Dressing in place. Extremities: No clubbing, cyanosis, edema. Distal pedal pulses are 2+ bilaterally. Neuro: Alert and oriented X 3. Moves all extremities spontaneously. Psych: Normal affect.  Labs    Chemistry Recent Labs Lab 06/30/16 1129 07/01/16 0256  NA 136 135  K 4.0 3.7  CL 104 105  CO2 24 21*  GLUCOSE 116* 111*  BUN 23* 19  CREATININE 1.50* 1.19*  CALCIUM 9.1 8.6*  PROT 7.8  --   ALBUMIN 3.8  --   AST 16  --   ALT 9*  --   ALKPHOS 61  --   BILITOT 1.5*  --   GFRNONAA 31* 42*  GFRAA 36* 48*  ANIONGAP 8 9     Hematology Recent Labs Lab 06/30/16 1129 07/01/16 0256 07/02/16 0450  WBC 7.4 8.3 9.3  RBC 3.34* 3.23* 3.32*  HGB 10.2* 10.0* 10.6*  HCT 30.6* 30.0* 31.1*  MCV 91.6 92.9 93.7  MCH 30.5 31.0 31.9  MCHC 33.3 33.3 34.1  RDW 13.5  13.6 13.7  PLT 211 223 214    Cardiac EnzymesNo results for input(s): TROPONINI in the last 168 hours. No results for input(s): TROPIPOC in the last 168 hours.   BNPNo results for input(s): BNP, PROBNP in the last 168 hours.   DDimer No results for input(s): DDIMER in the last 168 hours.    Radiology    Ct Abdomen Pelvis Wo Contrast  Result Date: 06/30/2016 CLINICAL DATA:  Increasing size of abdominal wall hernia.  Vomiting. EXAM: CT ABDOMEN AND PELVIS WITHOUT CONTRAST TECHNIQUE: Multidetector CT imaging of the abdomen and pelvis was performed following the standard protocol without IV contrast. COMPARISON:  MRI abdomen 04/18/2013.  PET-CT 03/19/2012 FINDINGS: Lower chest: Basilar scarring changes without pleural effusion or focal pulmonary lesions. Cardiac enlargement with extensive coronary artery calcifications. Small hiatal hernia. Hepatobiliary: No focal hepatic lesions or intrahepatic biliary dilatation. The gallbladder is surgically  absent. Mild stable common bile duct dilatation. Pancreas: Stable sequela of chronic pancreatitis with marked atrophy and cystic change in the pancreatic tail. No findings for acute pancreatitis. No pancreatic ductal dilatation. Spleen: Normal size.  No focal lesions. Adrenals/Urinary Tract: The adrenal glands and kidneys are grossly normal. No renal, ureteral or bladder calculi or mass. Stomach/Bowel: The stomach, duodenum and small bowel are unremarkable. No findings for obstruction or perforation. The terminal ileum is normal. The cecum and ascending colon are dilated and fluid-filled with air-fluid levels. There is also some surrounding inflammation. This is due to a right-sided abdominal wall hernia at a surgical incision site which contains part of the transverse colon. The proximal/entering loop is obstructed. The transverse colon exiting the hernia is not obstructed. Vascular/Lymphatic: Advanced atherosclerotic calcifications involving the aorta and branch vessels. No aneurysm. No mesenteric or retroperitoneal mass or adenopathy. Reproductive: The uterus and ovaries are unremarkable. Other: No pelvic mass or adenopathy. No free pelvic fluid collections. No inguinal mass or adenopathy. No subcutaneous lesions. Musculoskeletal: No significant bony findings. IMPRESSION: 1. Right-sided lower abdominal wall hernia contains part of the transverse colon. There is evidence of proximal obstruction with dilated cecum and ascending colon with air-fluid level and surrounding inflammation in the omental fat. There is also some inflammation and fluid in the hernia sac. 2. Normal CT appearance of the small bowel. 3. Cardiac enlargement and extensive coronary artery calcifications. 4. Chronic postinflammatory changes involving the pancreas without significant change since prior MRI abdomen from 2015. Electronically Signed   By: Marijo Sanes M.D.   On: 06/30/2016 15:12   Dg Abd 1 View  Result Date: 06/30/2016 CLINICAL  DATA:  Gastric tube placement EXAM: ABDOMEN - 1 VIEW COMPARISON:  Same day CT of the abdomen and pelvis FINDINGS: Further advancement of a gastric tube is recommended as the side-port of the tube is seen at the gastroesophageal junction. Mildly distended small bowel loops are seen predominantly left hemiabdomen with moderate gaseous distention of large bowel in the right hemiabdomen. Suture material noted in sagittal plane overlying the mid abdomen. Cholecystectomy clips are seen in right upper quadrant. IMPRESSION: Further advancement of the gastric tube of at least 4-5 cm is recommended as the side-port is noted at the GE junction. Mild to moderate gaseous distention of small bowel loops in the left hemiabdomen. Moderate gaseous distention large bowel in the right hemiabdomen. Findings are likely related to the noted ventral hernia seen on same day CT. These results will be called to the ordering clinician or representative by the Radiologist Assistant, and communication documented in the PACS or zVision  Dashboard. Electronically Signed   By: Ashley Royalty M.D.   On: 06/30/2016 19:10   Dg Abd Acute W/chest  Result Date: 06/30/2016 CLINICAL DATA:  Increase in hernia size over the last few days with increasing abdominal pain, initial encounter EXAM: DG ABDOMEN ACUTE W/ 1V CHEST COMPARISON:  06/04/2015 FINDINGS: Cardiac shadow remains enlarged. Aortic calcifications are again seen. The lungs are clear bilaterally. Old rib fractures are noted on the left. Scattered large and small bowel gas is noted. Some air-fluid levels are noted in the right mid abdomen. Given the patient's clinical history this may represent some very early partial small bowel obstruction. No colonic dilatation is seen. Degenerative changes of the lumbar spine are noted. IMPRESSION: Few dilated loops of small bowel with air-fluid levels. This may represent an early partial small bowel obstruction given the clinical history. CT imaging may be  helpful for further evaluation. Electronically Signed   By: Inez Catalina M.D.   On: 06/30/2016 11:55   Dg Abd Portable 1v  Result Date: 06/30/2016 CLINICAL DATA:  Nasogastric tube placement.  Initial encounter. EXAM: PORTABLE ABDOMEN - 1 VIEW COMPARISON:  Abdominal radiograph performed earlier today at 6:32 p.m. FINDINGS: The patient's enteric tube is noted ending overlying the body of the stomach. Distention of small and large bowel loops is relatively stable, reflecting the patient's ventral hernia as previously noted. No free intra-abdominal air is seen, though evaluation for free air is limited on a single supine view. No acute osseous abnormalities are identified. IMPRESSION: Enteric tube noted ending overlying the body of the stomach. Electronically Signed   By: Garald Balding M.D.   On: 06/30/2016 23:45    Cardiac Studies   None  Patient Profile     83 y.o. female with PMH of HTN, DM, TIA, HL, breast Ca, and permanent Afib who presented with umbilical hernia with incarceration. Cardiology asked to see for preop evaluation   Assessment & Plan    1. Permanent Afib: Rate remains controlled on IV BB while NPO. Eliquis held prior to surgery, and was on IV heparin preop. Would resume anticoagulation when cleared from a surgical standpoint.   2. HTN: Remain elevated. Scheduled IV lopressor. PRN IV hydralazine.  3. Chronic HF: No signs of volume overload on exam. Weight stable. Able to lay flat in bed.  4. Incarcerated Hernia: Underwent successful hernia surgery yesterday afternoon. Post op Hgb stable. Feel wells post op. NGT in place still.   Signed, Reino Bellis, NP  07/02/2016, 8:29 AM     I have seen, examined and evaluated the patient this PM along with Ms. Mancel Bale, NP.  After reviewing all the available data and chart, we discussed the patients laboratory, study & physical findings as well as symptoms in detail. I agree with her findings, examination as well as impression  recommendations as per our discussion.    Afib rate stable - BP up a bit -- will increase freq. Of IV BB to q 4 hr NO SSx of CHF.  Progressing well post-op incarcerated hernia repair - NGT still in place ->  Transition to PO per surgery -- will help adjust PO meds @ that time.    Glenetta Hew, M.D., M.S. Interventional Cardiologist   Pager # [phone] Phone # [phone] [address] [place], [place] [address]

## 2016-07-03 ENCOUNTER — Ambulatory Visit: Payer: Medicare Other | Admitting: Internal Medicine

## 2016-07-03 LAB — CBC
HCT: 27.6 % — ABNORMAL LOW (ref 36.0–46.0)
Hemoglobin: 9.2 g/dL — ABNORMAL LOW (ref 12.0–15.0)
MCH: 30.2 pg (ref 26.0–34.0)
MCHC: 33.3 g/dL (ref 30.0–36.0)
MCV: 90.5 fL (ref 78.0–100.0)
Platelets: 223 10*3/uL (ref 150–400)
RBC: 3.05 MIL/uL — ABNORMAL LOW (ref 3.87–5.11)
RDW: 14 % (ref 11.5–15.5)
WBC: 13.5 10*3/uL — AB (ref 4.0–10.5)

## 2016-07-03 LAB — GLUCOSE, CAPILLARY
GLUCOSE-CAPILLARY: 115 mg/dL — AB (ref 65–99)
GLUCOSE-CAPILLARY: 88 mg/dL (ref 65–99)
Glucose-Capillary: 137 mg/dL — ABNORMAL HIGH (ref 65–99)
Glucose-Capillary: 130 mg/dL — ABNORMAL HIGH (ref 65–99)
Glucose-Capillary: 97 mg/dL (ref 65–99)
Glucose-Capillary: 85 mg/dL (ref 65–99)
Glucose-Capillary: 134 mg/dL — ABNORMAL HIGH (ref 65–99)

## 2016-07-03 LAB — BASIC METABOLIC PANEL
ANION GAP: 7 (ref 5–15)
BUN: 30 mg/dL — ABNORMAL HIGH (ref 6–20)
CALCIUM: 8.6 mg/dL — AB (ref 8.9–10.3)
CO2: 22 mmol/L (ref 22–32)
Chloride: 107 mmol/L (ref 101–111)
Creatinine, Ser: 1.28 mg/dL — ABNORMAL HIGH (ref 0.44–1.00)
GFR, EST AFRICAN AMERICAN: 44 mL/min — AB (ref >60)
GFR, EST NON AFRICAN AMERICAN: 38 mL/min — AB (ref >60)
GLUCOSE: 107 mg/dL — AB (ref 65–99)
Potassium: 4.5 mmol/L (ref 3.5–5.1)
Sodium: 136 mmol/L (ref 135–145)

## 2016-07-03 NOTE — Progress Notes (Addendum)
ANTICOAGULATION CONSULT NOTE - Follow Up Consult  Pharmacy Consult for Heparin Indication: atrial fibrillation  Allergies  Allergen Reactions  . Codeine Sulfate Other (See Comments)    REACTION: GI upset  . Sulfonamide Derivatives Other (See Comments)    REACTION: GI upset  . Vancomycin Other (See Comments)    Red man syndrome  . Atarax [Hydroxyzine] Nausea Only and Rash    Patient Measurements: Height: 5' 6" (167.6 cm) Weight: 156 lb 1.4 oz (70.8 kg) IBW/kg (Calculated) : 59.3 Heparin Dosing Weight: 63 kg  Vital Signs: Temp: 98.1 F (36.7 C) (03/29 0448) Temp Source: Oral (03/29 0448) BP: 148/71 (03/29 0448) Pulse Rate: 80 (03/29 0448)  Labs:  Recent Labs  06/30/16 1129 06/30/16 2020 07/01/16 0256 07/02/16 0450 07/02/16 0812 07/03/16 0459  HGB 10.2*  --  10.0* 10.6*  --  9.2*  HCT 30.6*  --  30.0* 31.1*  --  27.6*  PLT 211  --  223 214  --  223  APTT  --  36 38*  --  73*  --   HEPARINUNFRC  --  1.01* 0.85*  --  1.07*  --   CREATININE 1.50*  --  1.19*  --   --  1.28*    Estimated Creatinine Clearance: 32.3 mL/min (A) (by C-G formula based on SCr of 1.28 mg/dL (H)).   Assessment: 83 yr female presents with c/o nausea/vomiting and abdominal pain from her hernia.  PMH significant for AFib (on Eliquis PTA), DM, HTN, hypothyroidism, CKD Stage 3.  Pharmacy consulted to dose IV heparin for AFib in anticipation of upcoming surgical repair of hernia.  PTA Eliquis 2.2m po BID with last dose 3/25. Heparin started 3/26 PM. Heparin off 3/27 for hernia repair - resumed3/27 at 2230 at rate of 1200 units/hr.  Today, 07/03/2016: Heparin resumed post-op 3/27 at 2230 at rate of 1200 units/hr. Heparin stopped last night with bleeding at surgical site. Still NPO with NG tube in.   Goal of Therapy:  Heparin level 0.3-0.7 units/ml  APTT goal: 66-102 sec Monitor platelets by anticoagulation protocol: Yes   Plan: heparin off since last night due to bleeding at surgical site,   Hg 10.6>9.2. Continue to hold heparin until resumed by surgery or until able to resume home Eliquis when able to take po  MEudelia Bunch Pharm.D. [phone]/29/2018 9:30 AM

## 2016-07-03 NOTE — Progress Notes (Signed)
qPhysical Therapy Treatment Patient Details Name: [name] [name] MRN: [id number] DOB: 1932/12/31 Today's Date: 07/03/2016    History of Present Illness 83 yo female s/p hernia repair 3/27. Hx of COPD, vertigo, DM, CVA, HTN, Afib, cellulitis    PT Comments    Progressing slowly with mobility. Pt is fearful of progression of activity since she is so weak and hasn't begun to eat just yet. Encouraged pt to work with therapy and to progress activity each session. Will continue to follow. Pt stated she plans to return home at discharge.    Follow Up Recommendations  Home health PT;Supervision/Assistance - 24 hour     Equipment Recommendations  None recommended by PT    Recommendations for Other Services OT consult     Precautions / Restrictions Precautions Precautions: Fall Precaution Comments: abdominal wound Restrictions Weight Bearing Restrictions: No    Mobility  Bed Mobility Overal bed mobility: Needs Assistance Bed Mobility: Supine to Sit     Supine to sit: Min assist;HOB elevated     General bed mobility comments: Increased time. Pt relied on bedrail. Cues for technique. Assist for trunk.   Transfers Overall transfer level: Needs assistance Equipment used: Rolling walker (2 wheeled) Transfers: Sit to/from Stand Sit to Stand: Min assist Stand pivot transfers: Min assist       General transfer comment: Assist to rise, stabilize, control descent. Increased time. VCs safety, hand placement. Stand pivot, bed>bsc with RW  Ambulation/Gait Ambulation/Gait assistance: Min assist Ambulation Distance (Feet): 25 Feet Assistive device: Rolling walker (2 wheeled) Gait Pattern/deviations: Step-through pattern;Decreased stride length     General Gait Details: Assist to stabilize pt and maneuver safely with walker. Pt fearful due to being so weak. She also tends to "hug" wall/railing due to hx of vertigo.   Stairs            Wheelchair Mobility    Modified  Rankin (Stroke Patients Only)       Balance Overall balance assessment: Needs assistance           Standing balance-Leahy Scale: Poor Standing balance comment: requiring RW                            Cognition Arousal/Alertness: Awake/alert Behavior During Therapy: WFL for tasks assessed/performed Overall Cognitive Status: Within Functional Limits for tasks assessed                                        Exercises      General Comments        Pertinent Vitals/Pain Pain Assessment: Faces Faces Pain Scale: Hurts even more Pain Location: abdomen Pain Descriptors / Indicators: Sore;Sharp Pain Intervention(s): Limited activity within patient's tolerance;Repositioned    Home Living                      Prior Function            PT Goals (current goals can now be found in the care plan section) Progress towards PT goals: Progressing toward goals (slowly)    Frequency    Min 3X/week      PT Plan Current plan remains appropriate    Co-evaluation             End of Session   Activity Tolerance: Patient limited by fatigue;Patient limited by pain Patient left: in  chair;with call bell/phone within reach;with chair alarm set   PT Visit Diagnosis: Muscle weakness (generalized) (M62.81);Difficulty in walking, not elsewhere classified (R26.2)     Time: 7737-3668 PT Time Calculation (min) (ACUTE ONLY): 27 min  Charges:  $Gait Training: 8-22 mins $Therapeutic Activity: 8-22 mins                    G Codes:          Weston Anna, MPT Pager: [phone]

## 2016-07-03 NOTE — Care Management Note (Signed)
Case Management Note  Patient Details  Name: KEILEY LEVEY MRN: [id number] Date of Birth: [birth year]  Subjective/Objective: PT-recc HHPT. Provided patient w/HHC agency list-patient chose [facility]-rep Kim aware of recc, await HHPT, f35forder. Patient already has rw,3n1,02. Await final HHPT order.                   Action/Plan:d/c home w/HHC.   Expected Discharge Date:                  Expected Discharge Plan:  Home/Self Care  In-House Referral:     Discharge planning Services  CM Consult  Post Acute Care Choice:  Durable Medical Equipment (Has home 02,rw,3n1-[facility]) Choice offered to:  Patient  DME Arranged:    DME Agency:     HH Arranged:    HCharcoAgency:     Status of Service:  In process, will continue to follow  If discussed at Long Length of Stay Meetings, dates discussed:    Additional Comments:  MDessa Phi RN 07/03/2016, 1:11 PM

## 2016-07-03 NOTE — Progress Notes (Signed)
Progress Note  Patient Name: [name] [name] Date of Encounter: 07/03/2016  Primary Cardiologist: [place]  Subjective   Feeling well this morning.  Had a rough night 2/2 incisional bleeding - IV Heparin d/c'd.  Now stable NGT still in & on suction +++ flatus this AM  Inpatient Medications    Scheduled Meds: . chlorhexidine  15 mL Mouth/Throat BID  . chlorhexidine  15 mL Mouth Rinse BID  . insulin aspart  0-9 Units Subcutaneous Q4H  . levothyroxine  50 mcg Intravenous Daily  . mouth rinse  15 mL Mouth Rinse q12n4p  . methylPREDNISolone (SOLU-MEDROL) injection  4 mg Intravenous Q24H  . metoprolol  5 mg Intravenous Q4H   Continuous Infusions: . 0.9 % NaCl with KCl 20 mEq / L 75 mL/hr at 07/03/16 0448   PRN Meds: diazepam, hydrALAZINE, morphine injection, ondansetron **OR** ondansetron (ZOFRAN) IV, prochlorperazine   Vital Signs    Vitals:   07/02/16 1744 07/02/16 2043 07/03/16 0058 07/03/16 0448  BP: (!) 131/58 (!) 151/80 (!) 168/62 (!) 148/71  Pulse:  84  80  Resp:  18  18  Temp:  98 F (36.7 C)  98.1 F (36.7 C)  TempSrc:  Oral  Oral  SpO2:  96%  96%  Weight:    156 lb 1.4 oz (70.8 kg)  Height:        Intake/Output Summary (Last 24 hours) at 07/03/16 0926 Last data filed at 07/03/16 3329  Gross per 24 hour  Intake          1926.25 ml  Output             1575 ml  Net           351.25 ml   Filed Weights   06/30/16 1725 07/02/16 0500 07/03/16 0448  Weight: 157 lb 6.5 oz (71.4 kg) 157 lb 3 oz (71.3 kg) 156 lb 1.4 oz (70.8 kg)    Telemetry    Afib (controlled) - Personally Reviewed  ECG    N/A - Personally Reviewed  Physical Exam   General: Well developed, well nourished, female - just looks a bit worn out Head: Normocephalic, atraumatic.  Neck: Supple without bruits, JVD. Lungs:  Resp regular and unlabored, CTA. Heart: Irreg Irreg -controlled rate , S1 &  S2 normal, no S3, S4, soft systolic murmur; no rub. Abdomen: Soft, non-tender, non-distended  with normoactive bowel sounds. No rebound - appropriately tender.. Dressing in place - no blood soaking. Extremities: No clubbing, cyanosis, edema. Distal pedal pulses are 2+ bilaterally. Neuro: Alert and oriented X 3. Moves all extremities spontaneously. Psych: Normal affect.  Labs    Chemistry  Recent Labs Lab 06/30/16 1129 07/01/16 0256 07/03/16 0459  NA 136 135 136  K 4.0 3.7 4.5  CL 104 105 107  CO2 24 21* 22  GLUCOSE 116* 111* 107*  BUN 23* 19 30*  CREATININE 1.50* 1.19* 1.28*  CALCIUM 9.1 8.6* 8.6*  PROT 7.8  --   --   ALBUMIN 3.8  --   --   AST 16  --   --   ALT 9*  --   --   ALKPHOS 61  --   --   BILITOT 1.5*  --   --   GFRNONAA 31* 42* 38*  GFRAA 36* 48* 44*  ANIONGAP _0 Hematology  Recent Labs Lab 07/01/16 0256 07/02/16 0450 07/03/16 0459  WBC 8.3 9.3 13.5*  RBC 3.23* 3.32* 3.05*  HGB 10.0* 10.6* 9.2*  HCT 30.0* 31.1* 27.6*  MCV 92.9 93.7 90.5  MCH 31.0 31.9 30.2  MCHC 33.3 34.1 33.3  RDW 13.6 13.7 14.0  PLT 223 214 223    Cardiac EnzymesNo results for input(s): TROPONINI in the last 168 hours. No results for input(s): TROPIPOC in the last 168 hours.   BNPNo results for input(s): BNP, PROBNP in the last 168 hours.   DDimer No results for input(s): DDIMER in the last 168 hours.    Radiology    No results found.  Cardiac Studies   None  Patient Profile     83 y.o. female with PMH of HTN, DM, TIA, HL, breast Ca, and permanent Afib who presented with umbilical hernia with incarceration. Cardiology asked to see for preop evaluation   Assessment & Plan   NO SSx of CHF. Progressing well post-op incarcerated hernia repair - NGT still in place ->  Transition to PO per surgery -- will help adjust PO meds @ that time.  1. Permanent Afib: Rate remains controlled on IV BB while NPO.  Eliquis held prior to surgery, and was on IV heparin preop - stopped b/c drop in Hgb.with drop in Hgb, continue to hold (no need to bridge if bleeding is a  concern. Would resume Home PO anticoagulation when cleared from a surgical standpoint. -- ok to hold for a few days to avoid further bleeding.  2. HTN: Remain elevated. Scheduled IV lopressor increased to q4hr. PRN IV hydralazine.  3. Chronic HF: No signs of volume overload on exam. Weight stable. Able to lay flat in bed.  4. Incarcerated Hernia: Underwent successful hernia surgery yesterday afternoon. Post op Hgb stable. Feel wells post op. NGT in place still.   Signed, Glenetta Hew, MD  07/03/2016, 9:26 AM

## 2016-07-03 NOTE — Progress Notes (Signed)
PROGRESS NOTE    [name] [name]  SHF:[id number] DOB: 06-May-1932 DOA: 06/30/2016 PCP: [name], MD    Brief Narrative:  83 yo female presented with nausea and vomiting. Persistent symptoms for few days prior to hospitalization. On the initial evaluation, hemodynamically stable, abdomen with non-reducible, positive bowel sounds. CT with right sided lower abdominal wall hernia with evidence of proximal obstruction. Surgically intervened to correct obstruction. Postoperative bleeding due to heparin drip. Patient has been npo with NG to suction.    Assessment & Plan:   Principal Problem:   Recurrent umbilical hernia with incarceration Active Problems:   Hypothyroidism   Essential hypertension   Type 2 diabetes mellitus (HCC)   Wegener's granulomatosis (granulomatosis with polyangiitis) (HCC)   Chronic kidney disease (CKD), stage IV (severe) (HCC)   Diastolic CHF, chronic (HCC) - on low dose Lasix. ARB & BB along with Norvasc   Chronic atrial fibrillation (HCC) CHADS2-VASc=6.  On Corrected "low" dose of Eliquis   Incarcerated hernia   Malnutrition of moderate degree   1. Right inguinal hernia incarceration. Patient with ng tube in place, low output over last 24 hours, will follow on surgery recommendations to remove ng and advance diet. Continue pain control.   2. Hypothyroid. Will continue levothyroxine IV, will change to po when patient tolerating po intake.   3. HTN. Blood pressure systolic 502 to 774, continue hydralazine as needed.   4. T2DM. Will continue glucose cover and monitoring with iss, capillary glucose   5. Wegner's granulomatosis. Will continue steroids IV, will change to oral when possible.   6. Diastolic heart failure. Patient euvolemic, will continue to follow a restrictive IV strategy. Will decrease IV fluids to avoid volume overload.   7. Chronic atrial fibrillation. Rate controlled, will continue to hold on anticoagulation,    8. Protein calorie  malnutrition. Nutrition supplementation      DVT prophylaxis: scd  Code Status: Full  Family Communication: No family at the bedside  Disposition Plan: home.   Consultants:   General surgery   Procedures:   Repair of incarcerated incisional hernia with obstruction (07/01/16)   Antimicrobials:       Subjective: Patient with abdominal pain, moderate in intensity, no nausea or vomiting, NG tube in place. Patient had bleeding at the surgical site last night. Heparin was held.   Objective: Vitals:   07/03/16 0058 07/03/16 0448 07/03/16 1223 07/03/16 1315  BP: (!) 168/62 (!) 148/71 (!) 179/92 (!) 174/88  Pulse:  80 89   Resp:  18 18   Temp:  98.1 F (36.7 C) 98 F (36.7 C)   TempSrc:  Oral Oral   SpO2:  96% 95%   Weight:  70.8 kg (156 lb 1.4 oz)    Height:        Intake/Output Summary (Last 24 hours) at 07/03/16 1408 Last data filed at 07/03/16 1057  Gross per 24 hour  Intake          1926.25 ml  Output             2075 ml  Net          -148.75 ml   Filed Weights   06/30/16 1725 07/02/16 0500 07/03/16 0448  Weight: 71.4 kg (157 lb 6.5 oz) 71.3 kg (157 lb 3 oz) 70.8 kg (156 lb 1.4 oz)    Examination:  General exam: deconditioned E ENT: no pallor or icterus, oral mucosa moist.  Respiratory system: Decreased breath sounds at bases. Respiratory effort normal. Cardiovascular  system: S1 & S2 heard, RRR. No JVD, murmurs, rubs, gallops or clicks. No pedal edema. Gastrointestinal system: Abdomen is tender to palpation, surgical wound with dressing in place, moderate saturation with blood.  Mild tender to palpation. No organomegaly or masses felt. Normal bowel sounds heard. Central nervous system: Alert and oriented. No focal neurological deficits. Extremities: Symmetric 5 x 5 power. Skin: No rashes, lesions or ulcers     Data Reviewed: I have personally reviewed following labs and imaging studies  CBC:  Recent Labs Lab 06/30/16 1129 07/01/16 0256  07/02/16 0450 07/03/16 0459  WBC 7.4 8.3 9.3 13.5*  NEUTROABS 4.7  --   --   --   HGB 10.2* 10.0* 10.6* 9.2*  HCT 30.6* 30.0* 31.1* 27.6*  MCV 91.6 92.9 93.7 90.5  PLT 211 223 214 177   Basic Metabolic Panel:  Recent Labs Lab 06/30/16 1129 07/01/16 0256 07/03/16 0459  NA 136 135 136  K 4.0 3.7 4.5  CL 104 105 107  CO2 24 21* 22  GLUCOSE 116* 111* 107*  BUN 23* 19 30*  CREATININE 1.50* 1.19* 1.28*  CALCIUM 9.1 8.6* 8.6*   GFR: Estimated Creatinine Clearance: 32.3 mL/min (A) (by C-G formula based on SCr of 1.28 mg/dL (H)). Liver Function Tests:  Recent Labs Lab 06/30/16 1129  AST 16  ALT 9*  ALKPHOS 61  BILITOT 1.5*  PROT 7.8  ALBUMIN 3.8    Recent Labs Lab 06/30/16 1129  LIPASE 30   No results for input(s): AMMONIA in the last 168 hours. Coagulation Profile: No results for input(s): INR, PROTIME in the last 168 hours. Cardiac Enzymes: No results for input(s): CKTOTAL, CKMB, CKMBINDEX, TROPONINI in the last 168 hours. BNP (last 3 results) No results for input(s): PROBNP in the last 8760 hours. HbA1C:  Recent Labs  06/30/16 1951  HGBA1C 6.0*   CBG:  Recent Labs Lab 07/02/16 2019 07/03/16 0104 07/03/16 0441 07/03/16 0753 07/03/16 1132  GLUCAP 137* 130* 115* 97 85   Lipid Profile: No results for input(s): CHOL, HDL, LDLCALC, TRIG, CHOLHDL, LDLDIRECT in the last 72 hours. Thyroid Function Tests: No results for input(s): TSH, T4TOTAL, FREET4, T3FREE, THYROIDAB in the last 72 hours. Anemia Panel: No results for input(s): VITAMINB12, FOLATE, FERRITIN, TIBC, IRON, RETICCTPCT in the last 72 hours. Sepsis Labs: No results for input(s): PROCALCITON, LATICACIDVEN in the last 168 hours.  Recent Results (from the past 240 hour(s))  Surgical pcr screen     Status: None   Collection Time: 06/30/16  6:24 PM  Result Value Ref Range Status   MRSA, PCR NEGATIVE NEGATIVE Final   Staphylococcus aureus NEGATIVE NEGATIVE Final    Comment:        The Xpert  SA Assay (FDA approved for NASAL specimens in patients over 9 years of age), is one component of a comprehensive surveillance program.  Test performance has been validated by [facility] for patients greater than or equal to 54 year old. It is not intended to diagnose infection nor to guide or monitor treatment.          Radiology Studies: No results found.      Scheduled Meds: . chlorhexidine  15 mL Mouth/Throat BID  . chlorhexidine  15 mL Mouth Rinse BID  . insulin aspart  0-9 Units Subcutaneous Q4H  . levothyroxine  50 mcg Intravenous Daily  . mouth rinse  15 mL Mouth Rinse q12n4p  . methylPREDNISolone (SOLU-MEDROL) injection  4 mg Intravenous Q24H  . metoprolol  5  mg Intravenous Q4H   Continuous Infusions: . 0.9 % NaCl with KCl 20 mEq / L 75 mL/hr at 07/03/16 0448     LOS: 3 days      Mauricio Gerome Apley, MD [facility] Pager [phone]  If 7PM-7AM, please contact night-coverage [email or web address] Password [email or web address] 07/03/2016, 2:08 PM

## 2016-07-03 NOTE — Progress Notes (Signed)
2 Days Post-Op  Subjective: Wants to know if NG can come out, passing flatus, Bled from midline last night or  This Am.  Heparin on hold.  I took dressing down and it looks fine.  Lots of clotted blood, especially at the base where the big clot was.    Objective: Vital signs in last 24 hours: Temp:  [97.9 F (36.6 C)-98.1 F (36.7 C)] 98 F (36.7 C) (03/29 1223) Pulse Rate:  [77-89] 89 (03/29 1223) Resp:  [18] 18 (03/29 1223) BP: (131-179)/(58-92) 174/88 (03/29 1315) SpO2:  [95 %-96 %] 95 % (03/29 1223) Weight:  [70.8 kg (156 lb 1.4 oz)] 70.8 kg (156 lb 1.4 oz) (03/29 0448) Last BM Date: 06/28/16 NPO NG about 100 each shift, she is anxious to have it out 1826 1775 urine Emesis  - 100 Afebrile, BP is elevated Creatinine is up H/H is stable.   WBC  Up some  Intake/Output from previous day: 03/28 0701 - 03/29 0700 In: 1926.3 [I.V.:1826.3; IV Piggyback:100] Out: 1875 [Urine:1775; Emesis/NG output:100] Intake/Output this shift: Total I/O In: -  Out: 550 [Urine:500; Emesis/NG output:50]  General appearance: alert, cooperative and no distress GI: soft, not really all that sore, few BS, midline incision had bled, not sure where. clot at the base of the wound, covered with ABD pads. I took everything down and redressed.    Lab Results:   Recent Labs  07/02/16 0450 07/03/16 0459  WBC 9.3 13.5*  HGB 10.6* 9.2*  HCT 31.1* 27.6*  PLT 214 223    BMET  Recent Labs  07/01/16 0256 07/03/16 0459  NA 135 136  K 3.7 4.5  CL 105 107  CO2 21* 22  GLUCOSE 111* 107*  BUN 19 30*  CREATININE 1.19* 1.28*  CALCIUM 8.6* 8.6*   PT/INR No results for input(s): LABPROT, INR in the last 72 hours.   Recent Labs Lab 06/30/16 1129  AST 16  ALT 9*  ALKPHOS 61  BILITOT 1.5*  PROT 7.8  ALBUMIN 3.8     Lipase     Component Value Date/Time   LIPASE 30 06/30/2016 1129     Studies/Results: No results found.  Medications: . chlorhexidine  15 mL Mouth/Throat BID  .  chlorhexidine  15 mL Mouth Rinse BID  . insulin aspart  0-9 Units Subcutaneous Q4H  . levothyroxine  50 mcg Intravenous Daily  . mouth rinse  15 mL Mouth Rinse q12n4p  . methylPREDNISolone (SOLU-MEDROL) injection  4 mg Intravenous Q24H  . metoprolol  5 mg Intravenous Q4H   . 0.9 % NaCl with KCl 20 mEq / L 75 mL/hr at 07/03/16 0448    Assessment/Plan Incarcerated incisional hernia with obstruction S/P repair of incarcerated incisional hernia with obstruction 07/01/16, [name] [name] History exploratory laparotomy and cholecystectomy, 1980s [name] [name] History Wegener's disease COPD/on steroids and home O2 Atrial fibrillation on chronic anticoagulation - Eliquis last dose 06/29/16 10 PM History CVA CKD Type II Diabetes Hx of left breast cancer with lobectomy and radiation therapy Hypertension Hypothyroidism  History of depression  FEN: IV fluids/npo =>> NG out >> clears later if no nausea ID: Cefotetan day 2  -  completed 07/02/16 DVT: Heparin drip  - off right now for midline bleeding   Plan:  Pull the NG and see how she does, if OK may try clears later this evening.  Getting Rx for BP.      LOS: 3 days    [name] [name] 07/03/2016 [phone]

## 2016-07-04 LAB — BASIC METABOLIC PANEL
Anion gap: 8 (ref 5–15)
BUN: 25 mg/dL — AB (ref 6–20)
CALCIUM: 8.7 mg/dL — AB (ref 8.9–10.3)
CO2: 23 mmol/L (ref 22–32)
Chloride: 104 mmol/L (ref 101–111)
Creatinine, Ser: 1.12 mg/dL — ABNORMAL HIGH (ref 0.44–1.00)
GFR calc Af Amer: 52 mL/min — ABNORMAL LOW (ref >60)
GFR, EST NON AFRICAN AMERICAN: 45 mL/min — AB (ref >60)
GLUCOSE: 91 mg/dL (ref 65–99)
POTASSIUM: 4.4 mmol/L (ref 3.5–5.1)
Sodium: 135 mmol/L (ref 135–145)

## 2016-07-04 LAB — GLUCOSE, CAPILLARY
GLUCOSE-CAPILLARY: 80 mg/dL (ref 65–99)
GLUCOSE-CAPILLARY: 79 mg/dL (ref 65–99)
Glucose-Capillary: 116 mg/dL — ABNORMAL HIGH (ref 65–99)
Glucose-Capillary: 84 mg/dL (ref 65–99)
Glucose-Capillary: 99 mg/dL (ref 65–99)

## 2016-07-04 LAB — CBC
HEMATOCRIT: 27.5 % — AB (ref 36.0–46.0)
Hemoglobin: 9.4 g/dL — ABNORMAL LOW (ref 12.0–15.0)
MCH: 32 pg (ref 26.0–34.0)
MCHC: 34.2 g/dL (ref 30.0–36.0)
MCV: 93.5 fL (ref 78.0–100.0)
PLATELETS: 219 10*3/uL (ref 150–400)
RBC: 2.94 MIL/uL — ABNORMAL LOW (ref 3.87–5.11)
RDW: 14.4 % (ref 11.5–15.5)
WBC: 11.1 10*3/uL — AB (ref 4.0–10.5)

## 2016-07-04 MED ORDER — AMLODIPINE BESYLATE 5 MG PO TABS
5.0000 mg | ORAL_TABLET | Freq: Every day | ORAL | Status: DC
  Administered 2016-07-04 – 2016-07-05 (×2): 5 mg via ORAL
  Filled 2016-07-04 (×2): qty 1

## 2016-07-04 MED ORDER — PREDNISONE 5 MG PO TABS
5.0000 mg | ORAL_TABLET | Freq: Every day | ORAL | Status: DC
  Administered 2016-07-05: 5 mg via ORAL
  Filled 2016-07-04: qty 1

## 2016-07-04 MED ORDER — NEBIVOLOL HCL 10 MG PO TABS
10.0000 mg | ORAL_TABLET | Freq: Every day | ORAL | Status: DC
  Administered 2016-07-04 – 2016-07-05 (×2): 10 mg via ORAL
  Filled 2016-07-04 (×2): qty 1

## 2016-07-04 MED ORDER — SODIUM CHLORIDE 0.9 % IV SOLN
INTRAVENOUS | Status: DC
  Administered 2016-07-04: 16:00:00 via INTRAVENOUS

## 2016-07-04 MED ORDER — ESCITALOPRAM OXALATE 10 MG PO TABS
10.0000 mg | ORAL_TABLET | Freq: Every day | ORAL | Status: DC
  Administered 2016-07-04 – 2016-07-05 (×2): 10 mg via ORAL
  Filled 2016-07-04 (×2): qty 1

## 2016-07-04 MED ORDER — LEVOTHYROXINE SODIUM 100 MCG PO TABS
100.0000 ug | ORAL_TABLET | Freq: Every day | ORAL | Status: DC
  Administered 2016-07-05: 100 ug via ORAL
  Filled 2016-07-04: qty 1

## 2016-07-04 MED ORDER — GABAPENTIN 100 MG PO CAPS
100.0000 mg | ORAL_CAPSULE | Freq: Two times a day (BID) | ORAL | Status: DC
  Administered 2016-07-04 – 2016-07-05 (×2): 100 mg via ORAL
  Filled 2016-07-04 (×2): qty 1

## 2016-07-04 NOTE — Progress Notes (Signed)
PROGRESS NOTE    [name] [name]  OCA:[id number] DOB: [birth year] DOA: 06/30/2016 PCP: [name], MD    Brief Narrative:  83 yo female presented with nausea and vomiting. Persistent symptoms for few days prior to hospitalization. On the initial evaluation, hemodynamically stable, abdomen with non-reducible, positive bowel sounds. CT with right sided lower abdominal wall hernia with evidence of proximal obstruction. Surgically intervened to correct obstruction. Postoperative bleeding due to heparin drip. Patient has been npo with NG to suction. Patient tolerating po well, ng tube has been removed.    Assessment & Plan:   Principal Problem:   Recurrent umbilical hernia with incarceration Active Problems:   Hypothyroidism   Essential hypertension   Type 2 diabetes mellitus (HCC)   Wegener's granulomatosis (granulomatosis with polyangiitis) (HCC)   Chronic kidney disease (CKD), stage IV (severe) (HCC)   Diastolic CHF, chronic (HCC) - on low dose Lasix. ARB & BB along with Norvasc   Chronic atrial fibrillation (HCC) CHADS2-VASc=6.  On Corrected "low" dose of Eliquis   Incarcerated hernia   Malnutrition of moderate degree   1. Right inguinal hernia incarceration. Patient had ng tube removed, will continue to follow surgery recommendations. Continue pain control. Continue to advance diet per surgery recommendations.   2. Hypothyroid. Continue levothyroxine PO.   3. HTN. Blood pressure systolic 354 to 301, continue hydralazine as needed, resume amlodipne  4. T2DM. Diet has been advanced, serum glucose 91. Will continue to check serum glucose in am. Advance diet as tolerated.   5. Wegner's granulomatosis. Resume po prednisone per her home dose.   6. Diastolic heart failure. Continue to follow a restrictive IV strategy, saline at 50 ml per hour. Will stop potassium supplementation.   7. Chronic atrial fibrillation. Rate controlled with nebivolol, will continue to hold on  anticoagulation, will plan to resume oral anticoagulation in am.    8. Protein calorie malnutrition. Continue nutrition supplementation.    9. Depression. Resume escitalopram.   DVT prophylaxis: scd  Code Status: Full  Family Communication: No family at the bedside  Disposition Plan: home.   Consultants:   General surgery   Procedures:   Repair of incarcerated incisional hernia with obstruction (07/01/16)    Antimicrobials:   Subjective: Patient feeling better without the NG tube. Nausea has improved and patient is tolerating po. No significant abdominal pain, no chest pain or dyspnea.   Objective: Vitals:   07/04/16 0033 07/04/16 0229 07/04/16 0443 07/04/16 0854  BP: (!) 182/81 (!) 172/84 (!) 162/81 (!) 168/92  Pulse: 82 72 99   Resp: _0 Temp: 98.1 F (36.7 C)  98.2 F (36.8 C)   TempSrc: Oral  Oral   SpO2: 96%  100% 100%  Weight:   70.7 kg (155 lb 12.8 oz)   Height:   5' 6" (1.676 m)     Intake/Output Summary (Last 24 hours) at 07/04/16 1224 Last data filed at 07/04/16 0937  Gross per 24 hour  Intake          1566.67 ml  Output             2000 ml  Net          -433.33 ml   Filed Weights   07/02/16 0500 07/03/16 0448 07/04/16 0443  Weight: 71.3 kg (157 lb 3 oz) 70.8 kg (156 lb 1.4 oz) 70.7 kg (155 lb 12.8 oz)    Examination:  General exam: deconditioned E ENT: mild pallor, oral mucosa moist. NG tube  has been removed.   Respiratory system: Clear to auscultation. Respiratory effort normal. Mild decreased breath sounds at bases.  Cardiovascular system: S1 & S2 heard, RRR. No JVD, murmurs, rubs, gallops or clicks. No pedal edema. Gastrointestinal system: Abdomen is nondistended, soft and nontender. No organomegaly or masses felt. Normal bowel sounds heard. Central nervous system: Alert and oriented. No focal neurological deficits. Extremities: Symmetric 5 x 5 power. Skin: No rashes, lesions or ulcers .     Data Reviewed: I have personally  reviewed following labs and imaging studies  CBC:  Recent Labs Lab 06/30/16 1129 07/01/16 0256 07/02/16 0450 07/03/16 0459 07/04/16 0441  WBC 7.4 8.3 9.3 13.5* 11.1*  NEUTROABS 4.7  --   --   --   --   HGB 10.2* 10.0* 10.6* 9.2* 9.4*  HCT 30.6* 30.0* 31.1* 27.6* 27.5*  MCV 91.6 92.9 93.7 90.5 93.5  PLT 211 223 214 223 570   Basic Metabolic Panel:  Recent Labs Lab 06/30/16 1129 07/01/16 0256 07/03/16 0459 07/04/16 0441  NA 136 135 136 135  K 4.0 3.7 4.5 4.4  CL 104 105 107 104  CO2 24 21* 22 23  GLUCOSE 116* 111* 107* 91  BUN 23* 19 30* 25*  CREATININE 1.50* 1.19* 1.28* 1.12*  CALCIUM 9.1 8.6* 8.6* 8.7*   GFR: Estimated Creatinine Clearance: 36.9 mL/min (A) (by C-G formula based on SCr of 1.12 mg/dL (H)). Liver Function Tests:  Recent Labs Lab 06/30/16 1129  AST 16  ALT 9*  ALKPHOS 61  BILITOT 1.5*  PROT 7.8  ALBUMIN 3.8    Recent Labs Lab 06/30/16 1129  LIPASE 30   No results for input(s): AMMONIA in the last 168 hours. Coagulation Profile: No results for input(s): INR, PROTIME in the last 168 hours. Cardiac Enzymes: No results for input(s): CKTOTAL, CKMB, CKMBINDEX, TROPONINI in the last 168 hours. BNP (last 3 results) No results for input(s): PROBNP in the last 8760 hours. HbA1C: No results for input(s): HGBA1C in the last 72 hours. CBG:  Recent Labs Lab 07/03/16 2146 07/04/16 0032 07/04/16 0444 07/04/16 0722 07/04/16 1134  GLUCAP 134* 99 84 80 79   Lipid Profile: No results for input(s): CHOL, HDL, LDLCALC, TRIG, CHOLHDL, LDLDIRECT in the last 72 hours. Thyroid Function Tests: No results for input(s): TSH, T4TOTAL, FREET4, T3FREE, THYROIDAB in the last 72 hours. Anemia Panel: No results for input(s): VITAMINB12, FOLATE, FERRITIN, TIBC, IRON, RETICCTPCT in the last 72 hours. Sepsis Labs: No results for input(s): PROCALCITON, LATICACIDVEN in the last 168 hours.  Recent Results (from the past 240 hour(s))  Surgical pcr screen      Status: None   Collection Time: 06/30/16  6:24 PM  Result Value Ref Range Status   MRSA, PCR NEGATIVE NEGATIVE Final   Staphylococcus aureus NEGATIVE NEGATIVE Final    Comment:        The Xpert SA Assay (FDA approved for NASAL specimens in patients over 81 years of age), is one component of a comprehensive surveillance program.  Test performance has been validated by [facility] for patients greater than or equal to 87 year old. It is not intended to diagnose infection nor to guide or monitor treatment.          Radiology Studies: No results found.      Scheduled Meds: . amLODipine  5 mg Oral Daily  . chlorhexidine  15 mL Mouth/Throat BID  . chlorhexidine  15 mL Mouth Rinse BID  . escitalopram  10 mg Oral  Daily  . gabapentin  100 mg Oral BID PC  . insulin aspart  0-9 Units Subcutaneous Q4H  . [START ON 07/05/2016] levothyroxine  100 mcg Oral QAC breakfast  . nebivolol  10 mg Oral Daily  . [START ON 07/05/2016] predniSONE  5 mg Oral Q breakfast   Continuous Infusions: . 0.9 % NaCl with KCl 20 mEq / L 50 mL/hr at 07/03/16 2132     LOS: 4 days       Tawni Millers, MD [facility] Pager [phone]  If 7PM-7AM, please contact night-coverage [email or web address] Password [email or web address] 07/04/2016, 12:24 PM

## 2016-07-04 NOTE — Progress Notes (Signed)
Patient ID: [name] [name], female   DOB: 10/14/1932, 83 y.o.   MRN: [id number] [facility] Progress Note:   3 Days Post-Op  Subjective: Mental status is clear;  Sitting on bedside Objective: Vital signs in last 24 hours: Temp:  [98 F (36.7 C)-98.2 F (36.8 C)] 98.2 F (36.8 C) (03/30 0443) Pulse Rate:  [72-99] 99 (03/30 0443) Resp:  [16-20] 18 (03/30 0854) BP: (153-182)/(73-92) 168/92 (03/30 0854) SpO2:  [95 %-100 %] 100 % (03/30 0854) Weight:  [70.7 kg (155 lb 12.8 oz)] 70.7 kg (155 lb 12.8 oz) (03/30 0443)  Intake/Output from previous day: 03/29 0701 - 03/30 0700 In: 1566.7 [P.O.:340; I.V.:1226.7] Out: 2150 [Urine:2000; Emesis/NG output:150] Intake/Output this shift: Total I/O In: -  Out: 400 [Urine:400]  Physical Exam: Work of breathing appears OK.  Not much abdominal pain  Lab Results:  Results for orders placed or performed during the hospital encounter of 06/30/16 (from the past 48 hour(s))  Glucose, capillary     Status: Abnormal   Collection Time: 07/02/16 11:28 AM  Result Value Ref Range   Glucose-Capillary 146 (H) 65 - 99 mg/dL  Glucose, capillary     Status: Abnormal   Collection Time: 07/02/16  4:24 PM  Result Value Ref Range   Glucose-Capillary 139 (H) 65 - 99 mg/dL  Glucose, capillary     Status: Abnormal   Collection Time: 07/02/16  8:19 PM  Result Value Ref Range   Glucose-Capillary 137 (H) 65 - 99 mg/dL  Glucose, capillary     Status: Abnormal   Collection Time: 07/03/16  1:04 AM  Result Value Ref Range   Glucose-Capillary 130 (H) 65 - 99 mg/dL  Glucose, capillary     Status: Abnormal   Collection Time: 07/03/16  4:41 AM  Result Value Ref Range   Glucose-Capillary 115 (H) 65 - 99 mg/dL  CBC     Status: Abnormal   Collection Time: 07/03/16  4:59 AM  Result Value Ref Range   WBC 13.5 (H) 4.0 - 10.5 K/uL   RBC 3.05 (L) 3.87 - 5.11 MIL/uL   Hemoglobin 9.2 (L) 12.0 - 15.0 g/dL   HCT 27.6 (L) 36.0 - 46.0 %   MCV 90.5 78.0 - 100.0 fL   MCH 30.2 26.0 - 34.0 pg   MCHC 33.3 30.0 - 36.0 g/dL   RDW 14.0 11.5 - 15.5 %   Platelets 223 150 - 400 K/uL  Basic metabolic panel     Status: Abnormal   Collection Time: 07/03/16  4:59 AM  Result Value Ref Range   Sodium 136 135 - 145 mmol/L   Potassium 4.5 3.5 - 5.1 mmol/L   Chloride 107 101 - 111 mmol/L   CO2 22 22 - 32 mmol/L   Glucose, Bld 107 (H) 65 - 99 mg/dL   BUN 30 (H) 6 - 20 mg/dL   Creatinine, Ser 1.28 (H) 0.44 - 1.00 mg/dL   Calcium 8.6 (L) 8.9 - 10.3 mg/dL   GFR calc non Af Amer 38 (L) >60 mL/min   GFR calc Af Amer 44 (L) >60 mL/min    Comment: (NOTE) The eGFR has been calculated using the CKD EPI equation. This calculation has not been validated in all clinical situations. eGFR's persistently <60 mL/min signify possible Chronic Kidney Disease.    Anion gap 7 5 - 15  Glucose, capillary     Status: None   Collection Time: 07/03/16  7:53 AM  Result Value Ref Range   Glucose-Capillary 97 65 -  99 mg/dL  Glucose, capillary     Status: None   Collection Time: 07/03/16 11:32 AM  Result Value Ref Range   Glucose-Capillary 85 65 - 99 mg/dL  Glucose, capillary     Status: None   Collection Time: 07/03/16  4:32 PM  Result Value Ref Range   Glucose-Capillary 88 65 - 99 mg/dL  Glucose, capillary     Status: Abnormal   Collection Time: 07/03/16  9:46 PM  Result Value Ref Range   Glucose-Capillary 134 (H) 65 - 99 mg/dL  Glucose, capillary     Status: None   Collection Time: 07/04/16 12:32 AM  Result Value Ref Range   Glucose-Capillary 99 65 - 99 mg/dL  CBC     Status: Abnormal   Collection Time: 07/04/16  4:41 AM  Result Value Ref Range   WBC 11.1 (H) 4.0 - 10.5 K/uL   RBC 2.94 (L) 3.87 - 5.11 MIL/uL   Hemoglobin 9.4 (L) 12.0 - 15.0 g/dL   HCT 27.5 (L) 36.0 - 46.0 %   MCV 93.5 78.0 - 100.0 fL   MCH 32.0 26.0 - 34.0 pg   MCHC 34.2 30.0 - 36.0 g/dL   RDW 14.4 11.5 - 15.5 %   Platelets 219 150 - 400 K/uL  Basic metabolic panel     Status: Abnormal   Collection  Time: 07/04/16  4:41 AM  Result Value Ref Range   Sodium 135 135 - 145 mmol/L   Potassium 4.4 3.5 - 5.1 mmol/L   Chloride 104 101 - 111 mmol/L   CO2 23 22 - 32 mmol/L   Glucose, Bld 91 65 - 99 mg/dL   BUN 25 (H) 6 - 20 mg/dL   Creatinine, Ser 1.12 (H) 0.44 - 1.00 mg/dL   Calcium 8.7 (L) 8.9 - 10.3 mg/dL   GFR calc non Af Amer 45 (L) >60 mL/min   GFR calc Af Amer 52 (L) >60 mL/min    Comment: (NOTE) The eGFR has been calculated using the CKD EPI equation. This calculation has not been validated in all clinical situations. eGFR's persistently <60 mL/min signify possible Chronic Kidney Disease.    Anion gap 8 5 - 15  Glucose, capillary     Status: None   Collection Time: 07/04/16  4:44 AM  Result Value Ref Range   Glucose-Capillary 84 65 - 99 mg/dL  Glucose, capillary     Status: None   Collection Time: 07/04/16  7:22 AM  Result Value Ref Range   Glucose-Capillary 80 65 - 99 mg/dL    Radiology/Results: No results found.  Anti-infectives: Anti-infectives    Start     Dose/Rate Route Frequency Ordered Stop   07/01/16 0900  cefoTEtan (CEFOTAN) 1 g in dextrose 5 % 50 mL IVPB  Status:  Discontinued     1 g 100 mL/hr over 30 Minutes Intravenous Every 12 hours 07/01/16 0721 07/02/16 1200      Assessment/Plan: Problem List: Patient Active Problem List   Diagnosis Date Noted  . Malnutrition of moderate degree 07/01/2016  . Incarcerated hernia 06/30/2016  . Recurrent umbilical hernia with incarceration 06/30/2016  . CAP (community acquired pneumonia) 04/03/2016  . Lobar pneumonia ([place]) 04/02/2016  . Cellulitis and abscess   . Diabetic foot ulcer ([place]) 12/10/2015  . Hereditary and idiopathic peripheral neuropathy 09/25/2014  . Dependent edema 09/25/2014  . UTI (lower urinary tract infection)   . Type 2 diabetes mellitus, controlled ([place]) 07/19/2014  . Chronic atrial fibrillation (HCC) CHADS2-VASc=6.  On Corrected "  low" dose of Eliquis 04/04/2014  . CKD (chronic kidney  disease) stage 3, GFR 30-59 ml/min 04/04/2014  . Abdominal hernia without obstruction or gangrene 01/09/2014  . Nausea alone 08/21/2013  . Diastolic CHF, chronic (HCC) - on low dose Lasix. ARB & BB along with Norvasc 08/18/2013  . COPD (chronic obstructive pulmonary disease) ([place]) 08/18/2013  . Sepsis ([place]) 08/18/2013  . Hand pain, left 08/18/2013  . COPD with acute exacerbation ([place]) 05/10/2013  . Chronic kidney disease (CKD), stage IV (severe) ([place]) 05/08/2013  . Normocytic anemia 05/08/2013  . Pulmonary hypertension 05/07/2013  . Right-sided heart failure 05/07/2013  . Pancreatic cyst 04/17/2013  . Obesity (BMI 30-39.9) 12/28/2012  . Wegener's granulomatosis (granulomatosis with polyangiitis) ([place]) 11/02/2012  . Cough with hemoptysis 11/01/2012  . Anemia 11/01/2012  . Dehydration 11/01/2012  . Diffuse pulmonary alveolar hemorrhage 11/01/2012  . Breast cancer ([place]) 09/14/2012  . Pruritus of skin 05/20/2012  . Carcinoid tumor of colon, malignant ([place]) 05/16/2011  . Cryptogenic organizing pneumonia 04/05/2011  . Vertigo, intermittent 10/21/2010  . Type 2 diabetes mellitus ([place]) 06/04/2010  . Edema 03/25/2010  . Hyponatremia 02/13/2010  . DEPRESSION 05/16/2009  . Hypothyroidism 07/24/2008  . Hyperlipidemia 07/24/2008  . Essential hypertension 07/24/2008  . OSTEOARTHRITIS 07/24/2008    Hungry for more than clear.  Advance to full liquids 3 Days Post-Op    LOS: 4 days   Matt B. Hassell Done, MD, [facility] [phone] beeper [phone]  07/04/2016 10:23 AM

## 2016-07-04 NOTE — Progress Notes (Signed)
qPhysical Therapy Treatment Patient Details Name: LISANDRA MATHISEN MRN: [id number] DOB: 1932-11-06 Today's Date: 07/04/2016    History of Present Illness 83 yo female s/p hernia repair 3/27. Hx of COPD, vertigo, DM, CVA, HTN, Afib, cellulitis    PT Comments    Progressing with mobility. Pt less fearful on today. She tolerated increased distance fairly well although she still fatigues fairly easily.    Follow Up Recommendations  Home health PT;Supervision/Assistance - 24 hour     Equipment Recommendations  None recommended by PT    Recommendations for Other Services OT consult     Precautions / Restrictions Precautions Precautions: Fall Precaution Comments: abdominal wound Restrictions Weight Bearing Restrictions: No    Mobility  Bed Mobility Overal bed mobility: Needs Assistance Bed Mobility: Supine to Sit;Sit to Supine     Supine to sit: Supervision;HOB elevated Sit to supine: Supervision;HOB elevated   General bed mobility comments: Increased time. Pt relied on bedrail.   Transfers Overall transfer level: Needs assistance Equipment used: Rolling walker (2 wheeled) Transfers: Sit to/from Stand Sit to Stand: Min guard         General transfer comment: close guard for safety. VCs safety, hand placement  Ambulation/Gait Ambulation/Gait assistance: Min guard Ambulation Distance (Feet): 75 Feet Assistive device: Rolling walker (2 wheeled) Gait Pattern/deviations: Step-through pattern;Decreased stride length     General Gait Details: close guard for safety. She also tends to "hug" wall/railing due to hx of vertigo. Fatigues fairly easily.    Stairs            Wheelchair Mobility    Modified Rankin (Stroke Patients Only)       Balance                                            Cognition Arousal/Alertness: Awake/alert Behavior During Therapy: WFL for tasks assessed/performed Overall Cognitive Status: Within Functional Limits  for tasks assessed                                        Exercises      General Comments        Pertinent Vitals/Pain Pain Assessment: Faces Faces Pain Scale: Hurts a little bit Pain Location: abdomen Pain Descriptors / Indicators: Sore Pain Intervention(s): Monitored during session;Repositioned    Home Living                      Prior Function            PT Goals (current goals can now be found in the care plan section) Progress towards PT goals: Progressing toward goals    Frequency    Min 3X/week      PT Plan Current plan remains appropriate    Co-evaluation             End of Session Equipment Utilized During Treatment: Gait belt Activity Tolerance: Patient limited by fatigue Patient left: in chair;with call bell/phone within reach   PT Visit Diagnosis: Muscle weakness (generalized) (M62.81);Difficulty in walking, not elsewhere classified (R26.2)     Time: 1593-0123 PT Time Calculation (min) (ACUTE ONLY): 18 min  Charges:  $Gait Training: 8-22 mins  G Codes:          Weston Anna, MPT Pager: [phone]

## 2016-07-04 NOTE — Progress Notes (Signed)
Progress Note  Patient Name: [name] [name] Date of Encounter: 07/04/2016  Primary Cardiologist: [place]  Subjective   Feeling well this morning. NGT out, tolerating clear liquids. Hoping for more solid food today.   Inpatient Medications    Scheduled Meds: . chlorhexidine  15 mL Mouth/Throat BID  . chlorhexidine  15 mL Mouth Rinse BID  . insulin aspart  0-9 Units Subcutaneous Q4H  . levothyroxine  50 mcg Intravenous Daily  . mouth rinse  15 mL Mouth Rinse q12n4p  . methylPREDNISolone (SOLU-MEDROL) injection  4 mg Intravenous Q24H  . metoprolol  5 mg Intravenous Q4H   Continuous Infusions: . 0.9 % NaCl with KCl 20 mEq / L 50 mL/hr at 07/03/16 2132   PRN Meds: diazepam, hydrALAZINE, morphine injection, ondansetron **OR** ondansetron (ZOFRAN) IV, prochlorperazine   Vital Signs    Vitals:   07/03/16 2131 07/04/16 0033 07/04/16 0229 07/04/16 0443  BP: (!) 153/73 (!) 182/81 (!) 172/84 (!) 162/81  Pulse: 73 82 72 99  Resp: _0 Temp: 98 F (36.7 C) 98.1 F (36.7 C)  98.2 F (36.8 C)  TempSrc: Oral Oral  Oral  SpO2: 98% 96%  100%  Weight:    155 lb 12.8 oz (70.7 kg)  Height:    5' 6" (1.676 m)    Intake/Output Summary (Last 24 hours) at 07/04/16 0820 Last data filed at 07/04/16 0456  Gross per 24 hour  Intake          1566.67 ml  Output             2100 ml  Net          -533.33 ml   Filed Weights   07/02/16 0500 07/03/16 0448 07/04/16 0443  Weight: 157 lb 3 oz (71.3 kg) 156 lb 1.4 oz (70.8 kg) 155 lb 12.8 oz (70.7 kg)    Telemetry    Afib (controlled) - Personally Reviewed  ECG    N/A - Personally Reviewed  Physical Exam   General: Well developed, well nourished, female  Head: Normocephalic, atraumatic.  Neck: Supple without bruits, JVD. Lungs:  Resp regular and unlabored, CTA. Heart: Irreg Irreg -controlled rate , S1 &  S2 normal, no S3, S4, soft systolic murmur; no rub. Abdomen: Soft, non-tender, non-distended with normoactive bowel sounds. No  rebound - appropriately tender. Dry dressing in place. Extremities: No clubbing, cyanosis, edema. Distal pedal pulses are 2+ bilaterally. Neuro: Alert and oriented X 3. Moves all extremities spontaneously. Psych: Normal affect.  Labs    Chemistry  Recent Labs Lab 06/30/16 1129 07/01/16 0256 07/03/16 0459 07/04/16 0441  NA 136 135 136 135  K 4.0 3.7 4.5 4.4  CL 104 105 107 104  CO2 24 21* 22 23  GLUCOSE 116* 111* 107* 91  BUN 23* 19 30* 25*  CREATININE 1.50* 1.19* 1.28* 1.12*  CALCIUM 9.1 8.6* 8.6* 8.7*  PROT 7.8  --   --   --   ALBUMIN 3.8  --   --   --   AST 16  --   --   --   ALT 9*  --   --   --   ALKPHOS 61  --   --   --   BILITOT 1.5*  --   --   --   GFRNONAA 31* 42* 38* 45*  GFRAA 36* 48* 44* 52*  ANIONGAP _1 Hematology  Recent Labs Lab 07/02/16 0450 07/03/16  0459 07/04/16 0441  WBC 9.3 13.5* 11.1*  RBC 3.32* 3.05* 2.94*  HGB 10.6* 9.2* 9.4*  HCT 31.1* 27.6* 27.5*  MCV 93.7 90.5 93.5  MCH 31.9 30.2 32.0  MCHC 34.1 33.3 34.2  RDW 13.7 14.0 14.4  PLT 214 223 219    Cardiac EnzymesNo results for input(s): TROPONINI in the last 168 hours. No results for input(s): TROPIPOC in the last 168 hours.   BNPNo results for input(s): BNP, PROBNP in the last 168 hours.   DDimer No results for input(s): DDIMER in the last 168 hours.    Radiology    No results found.  Cardiac Studies   None  Patient Profile     83 y.o. female with PMH of HTN, DM, TIA, HL, breast Ca, and permanent Afib who presented with umbilical hernia with incarceration. Cardiology asked to see for preop evaluation   Assessment & Plan   1. Permanent Afib: Rate remains controlled on IV BB, transition back to orals when ok with surgery. -- heparin stopped due to bleeding from surgical site yesterday. No bleeding today. -- ok to resume home PO anticoagulation when cleared from a surgical standpoint. -- ok to hold for a few days if needed to avoid further bleeding.  2. HTN:  Remain elevated. Scheduled IV lopressor q4hr. PRN IV hydralazine.  -- Ok to resume oral regimen when cleared by surgery. Keep PRN IV lopressor.   3. Chronic HF: No signs of volume overload on exam. Weight stable. Able to lay flat in bed. Breathing ok.   4. Incarcerated Hernia: Underwent successful hernia surgery. NGT out now, tolerating clear liquids. Dressing is dry, no further bleeding complications.    Signed, Reino Bellis, NP  07/04/2016, 8:20 AM    I have seen, examined and evaluated the patient this AM along with Ms. Mancel Bale, NP.  After reviewing all the available data and chart, we discussed the patients laboratory, study & physical findings as well as symptoms in detail. I agree with her findings, examination as well as impression recommendations as per our discussion.    Progressive slowly - NGT out & on liquids.  Afib stable rate -- can convert to her home PO regimen for d/c Restart Eliquis on d/c.  Will follow    Glenetta Hew, M.D., M.S. Interventional Cardiologist   Pager # [phone] Phone # [phone] [address] [place], [place] [address]

## 2016-07-04 NOTE — Progress Notes (Signed)
Chaplain responded to consult for patient wanting prayer.  Patient's spouse was able to visit today.  Patient said she was happy that she gets to go home tomorrow and very happy to have her husband here today.  Patient expresses her appreciation for having tube out of her nose after having surgery.  She speaks of healing and recovery.  Patient also talks about this being Easter Weekend and her being happy to go home since it's Easter weekend.  Chaplain asked the patient if she still desired prayer noting that her request was made several days ago.  Patient expresses that she always loves prayer.  Chaplain asked about their faith and was told they are [name] [name] asked patient what they should pray together for and the patient said health and the health of their family.  Chaplain, patient and the patient's spouse prayed together.  Patient and her spouse expressed appreciation for the prayer and for the care they are receiving at [facility].    07/04/16 1201  Clinical Encounter Type  Visited With Patient;Family  Visit Type Initial;Spiritual support  Spiritual Encounters  Spiritual Needs Prayer   Berneice Heinrich.

## 2016-07-05 DIAGNOSIS — K56691 Other complete intestinal obstruction: Secondary | ICD-10-CM

## 2016-07-05 LAB — BASIC METABOLIC PANEL
Anion gap: 8 (ref 5–15)
BUN: 21 mg/dL — AB (ref 6–20)
CHLORIDE: 103 mmol/L (ref 101–111)
CO2: 22 mmol/L (ref 22–32)
Calcium: 8.5 mg/dL — ABNORMAL LOW (ref 8.9–10.3)
Creatinine, Ser: 1.15 mg/dL — ABNORMAL HIGH (ref 0.44–1.00)
GFR calc Af Amer: 50 mL/min — ABNORMAL LOW (ref >60)
GFR calc non Af Amer: 43 mL/min — ABNORMAL LOW (ref >60)
GLUCOSE: 87 mg/dL (ref 65–99)
Potassium: 4.1 mmol/L (ref 3.5–5.1)
SODIUM: 133 mmol/L — AB (ref 135–145)

## 2016-07-05 LAB — CBC
HCT: 28.7 % — ABNORMAL LOW (ref 36.0–46.0)
HEMOGLOBIN: 9.7 g/dL — AB (ref 12.0–15.0)
MCH: 31.6 pg (ref 26.0–34.0)
MCHC: 33.8 g/dL (ref 30.0–36.0)
MCV: 93.5 fL (ref 78.0–100.0)
Platelets: 234 10*3/uL (ref 150–400)
RBC: 3.07 MIL/uL — AB (ref 3.87–5.11)
RDW: 14.4 % (ref 11.5–15.5)
WBC: 9.6 10*3/uL (ref 4.0–10.5)

## 2016-07-05 LAB — GLUCOSE, CAPILLARY
GLUCOSE-CAPILLARY: 82 mg/dL (ref 65–99)
GLUCOSE-CAPILLARY: 186 mg/dL — AB (ref 65–99)
Glucose-Capillary: 89 mg/dL (ref 65–99)

## 2016-07-05 MED ORDER — ACETAMINOPHEN 325 MG PO TABS
650.0000 mg | ORAL_TABLET | Freq: Once | ORAL | Status: AC
  Administered 2016-07-05: 650 mg via ORAL
  Filled 2016-07-05: qty 2

## 2016-07-05 MED ORDER — APIXABAN 2.5 MG PO TABS
2.5000 mg | ORAL_TABLET | Freq: Two times a day (BID) | ORAL | Status: DC
  Administered 2016-07-05: 2.5 mg via ORAL
  Filled 2016-07-05: qty 1

## 2016-07-05 NOTE — Progress Notes (Signed)
Patient ID: [name] [name], female   DOB: 12/08/1932, 82 y.o.   MRN: [id number] [facility] Progress Note:   4 Days Post-Op  Subjective: Mental status is clear;  hungry Objective: Vital signs in last 24 hours: Temp:  [97.8 F (36.6 C)-99.2 F (37.3 C)] 99.2 F (37.3 C) (03/31 0634) Pulse Rate:  [81-96] 96 (03/31 0634) Resp:  [16-18] 16 (03/31 0634) BP: (134-169)/(84-96) 169/87 (03/31 0634) SpO2:  [96 %-100 %] 96 % (03/31 0634)  Intake/Output from previous day: 03/30 0701 - 03/31 0700 In: 1400 [I.V.:1400] Out: 1750 [Urine:1750] Intake/Output this shift: No intake/output data recorded.  Physical Exam: Work of breathing is normal.  Abdomen is nontender and she is tolerating diet  Lab Results:  Results for orders placed or performed during the hospital encounter of 06/30/16 (from the past 48 hour(s))  Glucose, capillary     Status: None   Collection Time: 07/03/16 11:32 AM  Result Value Ref Range   Glucose-Capillary 85 65 - 99 mg/dL  Glucose, capillary     Status: None   Collection Time: 07/03/16  4:32 PM  Result Value Ref Range   Glucose-Capillary 88 65 - 99 mg/dL  Glucose, capillary     Status: Abnormal   Collection Time: 07/03/16  9:46 PM  Result Value Ref Range   Glucose-Capillary 134 (H) 65 - 99 mg/dL  Glucose, capillary     Status: None   Collection Time: 07/04/16 12:32 AM  Result Value Ref Range   Glucose-Capillary 99 65 - 99 mg/dL  CBC     Status: Abnormal   Collection Time: 07/04/16  4:41 AM  Result Value Ref Range   WBC 11.1 (H) 4.0 - 10.5 K/uL   RBC 2.94 (L) 3.87 - 5.11 MIL/uL   Hemoglobin 9.4 (L) 12.0 - 15.0 g/dL   HCT 27.5 (L) 36.0 - 46.0 %   MCV 93.5 78.0 - 100.0 fL   MCH 32.0 26.0 - 34.0 pg   MCHC 34.2 30.0 - 36.0 g/dL   RDW 14.4 11.5 - 15.5 %   Platelets 219 150 - 400 K/uL  Basic metabolic panel     Status: Abnormal   Collection Time: 07/04/16  4:41 AM  Result Value Ref Range   Sodium 135 135 - 145 mmol/L   Potassium 4.4 3.5 - 5.1 mmol/L    Chloride 104 101 - 111 mmol/L   CO2 23 22 - 32 mmol/L   Glucose, Bld 91 65 - 99 mg/dL   BUN 25 (H) 6 - 20 mg/dL   Creatinine, Ser 1.12 (H) 0.44 - 1.00 mg/dL   Calcium 8.7 (L) 8.9 - 10.3 mg/dL   GFR calc non Af Amer 45 (L) >60 mL/min   GFR calc Af Amer 52 (L) >60 mL/min    Comment: (NOTE) The eGFR has been calculated using the CKD EPI equation. This calculation has not been validated in all clinical situations. eGFR's persistently <60 mL/min signify possible Chronic Kidney Disease.    Anion gap 8 5 - 15  Glucose, capillary     Status: None   Collection Time: 07/04/16  4:44 AM  Result Value Ref Range   Glucose-Capillary 84 65 - 99 mg/dL  Glucose, capillary     Status: None   Collection Time: 07/04/16  7:22 AM  Result Value Ref Range   Glucose-Capillary 80 65 - 99 mg/dL  Glucose, capillary     Status: None   Collection Time: 07/04/16 11:34 AM  Result Value Ref Range  Glucose-Capillary 79 65 - 99 mg/dL  Glucose, capillary     Status: Abnormal   Collection Time: 07/04/16  8:35 PM  Result Value Ref Range   Glucose-Capillary 116 (H) 65 - 99 mg/dL  CBC     Status: Abnormal   Collection Time: 07/05/16  5:22 AM  Result Value Ref Range   WBC 9.6 4.0 - 10.5 K/uL   RBC 3.07 (L) 3.87 - 5.11 MIL/uL   Hemoglobin 9.7 (L) 12.0 - 15.0 g/dL   HCT 28.7 (L) 36.0 - 46.0 %   MCV 93.5 78.0 - 100.0 fL   MCH 31.6 26.0 - 34.0 pg   MCHC 33.8 30.0 - 36.0 g/dL   RDW 14.4 11.5 - 15.5 %   Platelets 234 150 - 400 K/uL  Basic metabolic panel     Status: Abnormal   Collection Time: 07/05/16  5:22 AM  Result Value Ref Range   Sodium 133 (L) 135 - 145 mmol/L   Potassium 4.1 3.5 - 5.1 mmol/L   Chloride 103 101 - 111 mmol/L   CO2 22 22 - 32 mmol/L   Glucose, Bld 87 65 - 99 mg/dL   BUN 21 (H) 6 - 20 mg/dL   Creatinine, Ser 1.15 (H) 0.44 - 1.00 mg/dL   Calcium 8.5 (L) 8.9 - 10.3 mg/dL   GFR calc non Af Amer 43 (L) >60 mL/min   GFR calc Af Amer 50 (L) >60 mL/min    Comment: (NOTE) The eGFR has been  calculated using the CKD EPI equation. This calculation has not been validated in all clinical situations. eGFR's persistently <60 mL/min signify possible Chronic Kidney Disease.    Anion gap 8 5 - 15  Glucose, capillary     Status: None   Collection Time: 07/05/16  6:26 AM  Result Value Ref Range   Glucose-Capillary 82 65 - 99 mg/dL  Glucose, capillary     Status: None   Collection Time: 07/05/16  7:19 AM  Result Value Ref Range   Glucose-Capillary 89 65 - 99 mg/dL    Radiology/Results: No results found.  Anti-infectives: Anti-infectives    Start     Dose/Rate Route Frequency Ordered Stop   07/01/16 0900  cefoTEtan (CEFOTAN) 1 g in dextrose 5 % 50 mL IVPB  Status:  Discontinued     1 g 100 mL/hr over 30 Minutes Intravenous Every 12 hours 07/01/16 0721 07/02/16 1200      Assessment/Plan: Problem List: Patient Active Problem List   Diagnosis Date Noted  . Malnutrition of moderate degree 07/01/2016  . Incarcerated hernia 06/30/2016  . Recurrent umbilical hernia with incarceration 06/30/2016  . CAP (community acquired pneumonia) 04/03/2016  . Lobar pneumonia ([place]) 04/02/2016  . Cellulitis and abscess   . Diabetic foot ulcer ([place]) 12/10/2015  . Hereditary and idiopathic peripheral neuropathy 09/25/2014  . Dependent edema 09/25/2014  . UTI (lower urinary tract infection)   . Type 2 diabetes mellitus, controlled ([place]) 07/19/2014  . Chronic atrial fibrillation (HCC) CHADS2-VASc=6.  On Corrected "low" dose of Eliquis 04/04/2014  . CKD (chronic kidney disease) stage 3, GFR 30-59 ml/min 04/04/2014  . Abdominal hernia without obstruction or gangrene 01/09/2014  . Nausea alone 08/21/2013  . Diastolic CHF, chronic (HCC) - on low dose Lasix. ARB & BB along with Norvasc 08/18/2013  . COPD (chronic obstructive pulmonary disease) ([place]) 08/18/2013  . Sepsis ([place]) 08/18/2013  . Hand pain, left 08/18/2013  . COPD with acute exacerbation ([place]) 05/10/2013  . Chronic kidney disease  (CKD),  stage IV (severe) ([place]) 05/08/2013  . Normocytic anemia 05/08/2013  . Pulmonary hypertension 05/07/2013  . Right-sided heart failure 05/07/2013  . Pancreatic cyst 04/17/2013  . Obesity (BMI 30-39.9) 12/28/2012  . Wegener's granulomatosis (granulomatosis with polyangiitis) ([place]) 11/02/2012  . Cough with hemoptysis 11/01/2012  . Anemia 11/01/2012  . Dehydration 11/01/2012  . Diffuse pulmonary alveolar hemorrhage 11/01/2012  . Breast cancer ([place]) 09/14/2012  . Pruritus of skin 05/20/2012  . Carcinoid tumor of colon, malignant ([place]) 05/16/2011  . Cryptogenic organizing pneumonia 04/05/2011  . Vertigo, intermittent 10/21/2010  . Type 2 diabetes mellitus ([place]) 06/04/2010  . Edema 03/25/2010  . Hyponatremia 02/13/2010  . DEPRESSION 05/16/2009  . Hypothyroidism 07/24/2008  . Hyperlipidemia 07/24/2008  . Essential hypertension 07/24/2008  . OSTEOARTHRITIS 07/24/2008    Advance to regular diet;  OK for discharge from our standpoint 4 Days Post-Op    LOS: 5 days   Matt B. Hassell Done, MD, [facility] [phone] beeper [phone]  07/05/2016 8:23 AM

## 2016-07-05 NOTE — Discharge Summary (Signed)
Physician Discharge Summary  [name] [name] TDV:[id number] DOB: Apr 03, 1933 DOA: 06/30/2016  PCP: [name], MD  Admit date: 06/30/2016 Discharge date: 07/05/2016  Admitted From: Home  Disposition:  Home   Recommendations for Outpatient Follow-up:  1. Follow up with PCP in 1-weeks   Home Health: No  Equipment/Devices: No   Discharge Condition: stable  CODE STATUS: full  Diet recommendation: Heart Healthy / Carb Modified    Brief/Interim Summary: This is an 83 year old female who presented to the hospital with the chief complaint of nausea. For last few days prior to hospitalization she has been experiencing nausea and vomiting. It has been associated with abdominal distention and pain. On initial physical examination her blood pressure was 181/83, heart rate 73, respiratory rate 18, temperature 97.6, oxygen saturation 96%. Her oral mucosa was dry, her lungs were clear to auscultation bilaterally, heart S1-S2 present irregularly irregular, his abdomen had a positive ventral hernia which was nonreducible, tender to palpation. No lower extremity edema. Sodium 136, potassium 4.0, chloride 104, bicarbonate 24, glucose 116, BUN 23, creatinine 1.50, calcium 9.1, AST 16, ALT 9, lipase 30, white count 7.4, hemoglobin 10.2, hematocrit 30.6, platelets 211. Urinalysis with 6-30 white cells, negative nitrates. Abdominal film with air-fluid levels suggested bowel obstruction. CT of the abdomen show right-sided lower abdominal wall hernia containing part of the transverse colon. Positive evidence of proximal obstruction with dilated cecum and ascending colon with air-fluid level and surrounding inflammation in the omental fat. EKG showed atrial fibrillation with a rate of 70 bpm.  The patient was admitted to the medical floor with the working diagnosis of incarcerated abdominal wall hernia.  1. Incarcerated abdominal wall hernia. Patient was admitted to the medical floor on the remote telemetry  monitor, NG tube was placed, surgery was consulted. Patient underwent repair of incarcerated incisional hernia with obstruction under general anesthesia. Patient responded well to the surgical intervention, the NG tube was removed, diet was advanced with good toleration and she was deemed stable to be discharge on March 31  Patient developed a bleeding of the surgical site that improved after holding heparin.  2. Atrial fibrillation. Chronic. Heart rate remained well-controlled, patient anticoagulation was continued initially with heparin then held due to bleeding at the surgical site. By the time of discharge no further bleeding, anticoagulation with apixaban has been resumed. Rate controlled with nebivolol.   3. Hypothyroid. Patient  was continued on levothyroxine  4. Hypertension. Patient was placed on as needed hydralazine, amlodipine has been resumed.  5. Type 2 diabetes mellitus. Patient was placed on insulin sliding-scale for glucose coverage and monitoring, by the time of discharge she will resume her oral hypoglycemic agent. Capillary glucose remained well-controlled.  6. Wegener's granulomatosis. Patient was continued on steroid therapy.  7. Protein calorie malnutrition patient was placed on nutritional supplements.  8. Depression. Resumed escitalopram  9. Acute kidney injury and chronic kidney disease, 3. Peak creatinine 1.28, patient received IV fluids, with improvement of renal function, creatinine returned to its baseline. Continue furosemide.  10. Diastolic heart failure, chronic and stable. Patient tolerated well IV fluids, continue rate control and anticoagulation for atrial fibrillation. Blood pressure control with amlodipine. Continue home dose of furosemide.    Discharge Diagnoses:  Principal Problem:   Recurrent umbilical hernia with incarceration Active Problems:   Hypothyroidism   Essential hypertension   Type 2 diabetes mellitus (HCC)   Wegener's granulomatosis  (granulomatosis with polyangiitis) (HCC)   Chronic kidney disease (CKD), stage IV (severe) (HCC)   Diastolic  CHF, chronic (HCC) - on low dose Lasix. ARB & BB along with Norvasc   Chronic atrial fibrillation (HCC) CHADS2-VASc=6.  On Corrected "low" dose of Eliquis   Incarcerated hernia   Malnutrition of moderate degree    Discharge Instructions   Allergies as of 07/05/2016      Reactions   Codeine Sulfate Other (See Comments)   REACTION: GI upset   Sulfonamide Derivatives Other (See Comments)   REACTION: GI upset   Vancomycin Other (See Comments)   Red man syndrome   Atarax [hydroxyzine] Nausea Only, Rash      Medication List    TAKE these medications   amLODipine 5 MG tablet Commonly known as:  NORVASC TAKE ONE TABLET BY MOUTH ONCE DAILY.   BYSTOLIC 10 MG tablet Generic drug:  nebivolol TAKE ONE TABLET BY MOUTH ONCE DAILY AFTER  BREAKFAST. What changed:  See the new instructions.   diazepam 5 MG tablet Commonly known as:  VALIUM Take one tablet by mouth as needed for severe vertigo flares What changed:  how much to take  how to take this  when to take this  reasons to take this  additional instructions   ELIQUIS 2.5 MG Tabs tablet Generic drug:  apixaban TAKE ONE TABLET BY MOUTH TWICE DAILY What changed:  See the new instructions.   escitalopram 10 MG tablet Commonly known as:  LEXAPRO Take 1 tablet (10 mg total) by mouth daily.   furosemide 20 MG tablet Commonly known as:  LASIX Take 1 tablet (20 mg total) by mouth daily.   gabapentin 100 MG capsule Commonly known as:  NEURONTIN TAKE ONE CAPSULE BY MOUTH TWICE DAILY BEFORE A  MEAL What changed:  See the new instructions.   glimepiride 2 MG tablet Commonly known as:  AMARYL Take 1 tablet (2 mg total) by mouth daily with breakfast. TAKE ONE TABLET BY MOUTH ONCE DAILY BEFORE BREAKFAST   levothyroxine 100 MCG tablet Commonly known as:  SYNTHROID, LEVOTHROID Take 1 tablet (100 mcg total) by mouth  daily before breakfast.   predniSONE 5 MG tablet Commonly known as:  DELTASONE Take 1 tablet (5 mg total) by mouth daily with breakfast. What changed:  when to take this   triamcinolone cream 0.1 % Commonly known as:  KENALOG Apply 1 application topically 2 (two) times daily as needed.      Follow-up Information    [facility] Follow up.   Why:  [facility] Contact information: [address] [place] [address] [phone]        [name], MD Follow up in 1 week(s).   Specialty:  Family Medicine Contact information: [address] [place] [address] [phone]          Allergies  Allergen Reactions  . Codeine Sulfate Other (See Comments)    REACTION: GI upset  . Sulfonamide Derivatives Other (See Comments)    REACTION: GI upset  . Vancomycin Other (See Comments)    Red man syndrome  . Atarax [Hydroxyzine] Nausea Only and Rash     Consultations:  Surgery  Cardiology    Procedures/Studies: Ct Abdomen Pelvis Wo Contrast  Result Date: 06/30/2016 CLINICAL DATA:  Increasing size of abdominal wall hernia.  Vomiting. EXAM: CT ABDOMEN AND PELVIS WITHOUT CONTRAST TECHNIQUE: Multidetector CT imaging of the abdomen and pelvis was performed following the standard protocol without IV contrast. COMPARISON:  MRI abdomen 04/18/2013.  PET-CT 03/19/2012 FINDINGS: Lower chest: Basilar scarring changes without pleural effusion or  focal pulmonary lesions. Cardiac enlargement with extensive coronary artery calcifications. Small hiatal hernia. Hepatobiliary: No focal hepatic lesions or intrahepatic biliary dilatation. The gallbladder is surgically absent. Mild stable common bile duct dilatation. Pancreas: Stable sequela of chronic pancreatitis with marked atrophy and cystic change in the pancreatic tail. No findings for acute pancreatitis. No pancreatic ductal dilatation. Spleen: Normal size.  No focal lesions.  Adrenals/Urinary Tract: The adrenal glands and kidneys are grossly normal. No renal, ureteral or bladder calculi or mass. Stomach/Bowel: The stomach, duodenum and small bowel are unremarkable. No findings for obstruction or perforation. The terminal ileum is normal. The cecum and ascending colon are dilated and fluid-filled with air-fluid levels. There is also some surrounding inflammation. This is due to a right-sided abdominal wall hernia at a surgical incision site which contains part of the transverse colon. The proximal/entering loop is obstructed. The transverse colon exiting the hernia is not obstructed. Vascular/Lymphatic: Advanced atherosclerotic calcifications involving the aorta and branch vessels. No aneurysm. No mesenteric or retroperitoneal mass or adenopathy. Reproductive: The uterus and ovaries are unremarkable. Other: No pelvic mass or adenopathy. No free pelvic fluid collections. No inguinal mass or adenopathy. No subcutaneous lesions. Musculoskeletal: No significant bony findings. IMPRESSION: 1. Right-sided lower abdominal wall hernia contains part of the transverse colon. There is evidence of proximal obstruction with dilated cecum and ascending colon with air-fluid level and surrounding inflammation in the omental fat. There is also some inflammation and fluid in the hernia sac. 2. Normal CT appearance of the small bowel. 3. Cardiac enlargement and extensive coronary artery calcifications. 4. Chronic postinflammatory changes involving the pancreas without significant change since prior MRI abdomen from 2015. Electronically Signed   By: Marijo Sanes M.D.   On: 06/30/2016 15:12   Dg Abd 1 View  Result Date: 06/30/2016 CLINICAL DATA:  Gastric tube placement EXAM: ABDOMEN - 1 VIEW COMPARISON:  Same day CT of the abdomen and pelvis FINDINGS: Further advancement of a gastric tube is recommended as the side-port of the tube is seen at the gastroesophageal junction. Mildly distended small bowel  loops are seen predominantly left hemiabdomen with moderate gaseous distention of large bowel in the right hemiabdomen. Suture material noted in sagittal plane overlying the mid abdomen. Cholecystectomy clips are seen in right upper quadrant. IMPRESSION: Further advancement of the gastric tube of at least 4-5 cm is recommended as the side-port is noted at the GE junction. Mild to moderate gaseous distention of small bowel loops in the left hemiabdomen. Moderate gaseous distention large bowel in the right hemiabdomen. Findings are likely related to the noted ventral hernia seen on same day CT. These results will be called to the ordering clinician or representative by the Radiologist Assistant, and communication documented in the PACS or zVision Dashboard. Electronically Signed   By: Ashley Royalty M.D.   On: 06/30/2016 19:10   Dg Abd Acute W/chest  Result Date: 06/30/2016 CLINICAL DATA:  Increase in hernia size over the last few days with increasing abdominal pain, initial encounter EXAM: DG ABDOMEN ACUTE W/ 1V CHEST COMPARISON:  06/04/2015 FINDINGS: Cardiac shadow remains enlarged. Aortic calcifications are again seen. The lungs are clear bilaterally. Old rib fractures are noted on the left. Scattered large and small bowel gas is noted. Some air-fluid levels are noted in the right mid abdomen. Given the patient's clinical history this may represent some very early partial small bowel obstruction. No colonic dilatation is seen. Degenerative changes of the lumbar spine are noted. IMPRESSION: Few dilated loops of  small bowel with air-fluid levels. This may represent an early partial small bowel obstruction given the clinical history. CT imaging may be helpful for further evaluation. Electronically Signed   By: Inez Catalina M.D.   On: 06/30/2016 11:55   Dg Abd Portable 1v  Result Date: 06/30/2016 CLINICAL DATA:  Nasogastric tube placement.  Initial encounter. EXAM: PORTABLE ABDOMEN - 1 VIEW COMPARISON:  Abdominal  radiograph performed earlier today at 6:32 p.m. FINDINGS: The patient's enteric tube is noted ending overlying the body of the stomach. Distention of small and large bowel loops is relatively stable, reflecting the patient's ventral hernia as previously noted. No free intra-abdominal air is seen, though evaluation for free air is limited on a single supine view. No acute osseous abnormalities are identified. IMPRESSION: Enteric tube noted ending overlying the body of the stomach. Electronically Signed   By: Garald Balding M.D.   On: 06/30/2016 23:45     Subjective: Patient feeling better, no nausea or vomiting, no palpitations or chest pain. No dyspnea.   Discharge Exam: Vitals:   07/04/16 2031 07/05/16 0634  BP: (!) 164/84 (!) 169/87  Pulse: 81 96  Resp: 16 16  Temp: 98.2 F (36.8 C) 99.2 F (37.3 C)   Vitals:   07/04/16 0854 07/04/16 1411 07/04/16 2031 07/05/16 0634  BP: (!) 168/92 (!) 134/96 (!) 164/84 (!) 169/87  Pulse:  91 81 96  Resp: _0 Temp:  97.8 F (36.6 C) 98.2 F (36.8 C) 99.2 F (37.3 C)  TempSrc:  Oral Oral Oral  SpO2: 100% 97% 98% 96%  Weight:      Height:        General: Pt is alert, awake, not in acute distress Cardiovascular: S1/S2 +, no rubs, no gallops Respiratory: CTA bilaterally, no wheezing, no rhonchi Abdominal: Soft, NT, ND, bowel sounds + Extremities: no edema, no cyanosis    The results of significant diagnostics from this hospitalization (including imaging, microbiology, ancillary and laboratory) are listed below for reference.     Microbiology: Recent Results (from the past 240 hour(s))  Surgical pcr screen     Status: None   Collection Time: 06/30/16  6:24 PM  Result Value Ref Range Status   MRSA, PCR NEGATIVE NEGATIVE Final   Staphylococcus aureus NEGATIVE NEGATIVE Final    Comment:        The Xpert SA Assay (FDA approved for NASAL specimens in patients over 75 years of age), is one component of a comprehensive  surveillance program.  Test performance has been validated by [facility] for patients greater than or equal to 48 year old. It is not intended to diagnose infection nor to guide or monitor treatment.      Labs: BNP (last 3 results)  Recent Labs  04/02/16 1848  BNP 8,937.3*   Basic Metabolic Panel:  Recent Labs Lab 06/30/16 1129 07/01/16 0256 07/03/16 0459 07/04/16 0441 07/05/16 0522  NA 136 135 136 135 133*  K 4.0 3.7 4.5 4.4 4.1  CL 104 105 107 104 103  CO2 24 21* _1 GLUCOSE 116* 111* 107* 91 87  BUN 23* 19 30* 25* 21*  CREATININE 1.50* 1.19* 1.28* 1.12* 1.15*  CALCIUM 9.1 8.6* 8.6* 8.7* 8.5*   Liver Function Tests:  Recent Labs Lab 06/30/16 1129  AST 16  ALT 9*  ALKPHOS 61  BILITOT 1.5*  PROT 7.8  ALBUMIN 3.8    Recent Labs Lab 06/30/16 1129  LIPASE 30   No  results for input(s): AMMONIA in the last 168 hours. CBC:  Recent Labs Lab 06/30/16 1129 07/01/16 0256 07/02/16 0450 07/03/16 0459 07/04/16 0441 07/05/16 0522  WBC 7.4 8.3 9.3 13.5* 11.1* 9.6  NEUTROABS 4.7  --   --   --   --   --   HGB 10.2* 10.0* 10.6* 9.2* 9.4* 9.7*  HCT 30.6* 30.0* 31.1* 27.6* 27.5* 28.7*  MCV 91.6 92.9 93.7 90.5 93.5 93.5  PLT 211 223 214 223 219 234   Cardiac Enzymes: No results for input(s): CKTOTAL, CKMB, CKMBINDEX, TROPONINI in the last 168 hours. BNP: Invalid input(s): POCBNP CBG:  Recent Labs Lab 07/04/16 1134 07/04/16 2035 07/05/16 0626 07/05/16 0719 07/05/16 1129  GLUCAP 79 116* 82 89 186*   D-Dimer No results for input(s): DDIMER in the last 72 hours. Hgb A1c No results for input(s): HGBA1C in the last 72 hours. Lipid Profile No results for input(s): CHOL, HDL, LDLCALC, TRIG, CHOLHDL, LDLDIRECT in the last 72 hours. Thyroid function studies No results for input(s): TSH, T4TOTAL, T3FREE, THYROIDAB in the last 72 hours.  Invalid input(s): FREET3 Anemia work up No results for input(s): VITAMINB12, FOLATE, FERRITIN, TIBC, IRON,  RETICCTPCT in the last 72 hours. Urinalysis    Component Value Date/Time   COLORURINE STRAW (A) 06/30/2016 1126   APPEARANCEUR CLEAR 06/30/2016 1126   LABSPEC 1.009 06/30/2016 1126   PHURINE 6.0 06/30/2016 1126   GLUCOSEU NEGATIVE 06/30/2016 1126   HGBUR SMALL (A) 06/30/2016 1126   BILIRUBINUR NEGATIVE 06/30/2016 1126   BILIRUBINUR n 12/30/2010 1700   KETONESUR 5 (A) 06/30/2016 1126   PROTEINUR NEGATIVE 06/30/2016 1126   UROBILINOGEN 1.0 09/25/2014 1550   NITRITE NEGATIVE 06/30/2016 1126   LEUKOCYTESUR MODERATE (A) 06/30/2016 1126   Sepsis Labs Invalid input(s): PROCALCITONIN,  WBC,  LACTICIDVEN Microbiology Recent Results (from the past 240 hour(s))  Surgical pcr screen     Status: None   Collection Time: 06/30/16  6:24 PM  Result Value Ref Range Status   MRSA, PCR NEGATIVE NEGATIVE Final   Staphylococcus aureus NEGATIVE NEGATIVE Final    Comment:        The Xpert SA Assay (FDA approved for NASAL specimens in patients over 87 years of age), is one component of a comprehensive surveillance program.  Test performance has been validated by [facility] for patients greater than or equal to 25 year old. It is not intended to diagnose infection nor to guide or monitor treatment.      Time coordinating discharge: 45 minutes  SIGNED:   Tawni Millers, MD  [facility] 07/05/2016, 12:03 PM Pager   If 7PM-7AM, please contact night-coverage [email or web address] Password [email or web address]

## 2016-07-05 NOTE — Progress Notes (Signed)
Progress Note  Patient Name: Dionicio Stall Date of Encounter: 07/05/2016  Primary Cardiologist: [place]  Subjective   Smiling broadly. She was told to expect DC home today. Tolerating full liquids, to have a regular breakfast today. Back on PO meds for rate control/BP.  Eliquis has not been restarted yet.   Inpatient Medications    Scheduled Meds: . amLODipine  5 mg Oral Daily  . chlorhexidine  15 mL Mouth/Throat BID  . chlorhexidine  15 mL Mouth Rinse BID  . escitalopram  10 mg Oral Daily  . gabapentin  100 mg Oral BID PC  . levothyroxine  100 mcg Oral QAC breakfast  . nebivolol  10 mg Oral Daily  . predniSONE  5 mg Oral Q breakfast   Continuous Infusions: . sodium chloride Stopped (07/05/16 0643)   PRN Meds: hydrALAZINE, morphine injection, ondansetron **OR** ondansetron (ZOFRAN) IV, prochlorperazine   Vital Signs    Vitals:   07/04/16 0854 07/04/16 1411 07/04/16 2031 07/05/16 0634  BP: (!) 168/92 (!) 134/96 (!) 164/84 (!) 169/87  Pulse:  91 81 96  Resp: _0 Temp:  97.8 F (36.6 C) 98.2 F (36.8 C) 99.2 F (37.3 C)  TempSrc:  Oral Oral Oral  SpO2: 100% 97% 98% 96%  Weight:      Height:        Intake/Output Summary (Last 24 hours) at 07/05/16 0845 Last data filed at 07/05/16 0645  Gross per 24 hour  Intake             1400 ml  Output             1750 ml  Net             -350 ml   Filed Weights   07/02/16 0500 07/03/16 0448 07/04/16 0443  Weight: 71.3 kg (157 lb 3 oz) 70.8 kg (156 lb 1.4 oz) 70.7 kg (155 lb 12.8 oz)    Physical Exam  Comfortable, smiling GEN: No acute distress.   Neck: No JVD Cardiac: irregular, no murmurs, rubs, or gallops.  Respiratory: Clear to auscultation bilaterally. GI: Mildly tender postop, Soft, nontender, non-distended  MS: No edema; No deformity. Neuro:  Nonfocal  Psych: Normal affect   Labs    Chemistry Recent Labs Lab 06/30/16 1129  07/03/16 0459 07/04/16 0441 07/05/16 0522  NA 136  < > 136 135  133*  K 4.0  < > 4.5 4.4 4.1  CL 104  < > 107 104 103  CO2 24  < > _1 GLUCOSE 116*  < > 107* 91 87  BUN 23*  < > 30* 25* 21*  CREATININE 1.50*  < > 1.28* 1.12* 1.15*  CALCIUM 9.1  < > 8.6* 8.7* 8.5*  PROT 7.8  --   --   --   --   ALBUMIN 3.8  --   --   --   --   AST 16  --   --   --   --   ALT 9*  --   --   --   --   ALKPHOS 61  --   --   --   --   BILITOT 1.5*  --   --   --   --   GFRNONAA 31*  < > 38* 45* 43*  GFRAA 36*  < > 44* 52* 50*  ANIONGAP 8  < > _2 < > = values in this  interval not displayed.   Hematology Recent Labs Lab 07/03/16 0459 07/04/16 0441 07/05/16 0522  WBC 13.5* 11.1* 9.6  RBC 3.05* 2.94* 3.07*  HGB 9.2* 9.4* 9.7*  HCT 27.6* 27.5* 28.7*  MCV 90.5 93.5 93.5  MCH 30.2 32.0 31.6  MCHC 33.3 34.2 33.8  RDW 14.0 14.4 14.4  PLT 223 219 234     Patient Profile     83 y.o. female with permanent Afib (history of TIA), HFpEF, HTN, DM s/p surgery for incarcerated umbilical hernia.  Assessment & Plan    1. AFib:  Needs to restart her anticoagulant, ideally at DC. If there is a need to hold the Eliquis linger, please let me know so I can make sure we arrange prompt follow up to restart this medication.  2. HTN: BP a little erratic, expect will settle down in normal range now, after resuming PO meds.  3. CHF: clinically euvolemic  Signed, Sanda Klein, MD  07/05/2016, 8:45 AM

## 2016-07-07 DIAGNOSIS — Z48812 Encounter for surgical aftercare following surgery on the circulatory system: Secondary | ICD-10-CM | POA: Diagnosis not present

## 2016-07-07 DIAGNOSIS — N183 Chronic kidney disease, stage 3 (moderate): Secondary | ICD-10-CM | POA: Diagnosis not present

## 2016-07-07 DIAGNOSIS — E1122 Type 2 diabetes mellitus with diabetic chronic kidney disease: Secondary | ICD-10-CM | POA: Diagnosis not present

## 2016-07-07 DIAGNOSIS — E039 Hypothyroidism, unspecified: Secondary | ICD-10-CM | POA: Diagnosis not present

## 2016-07-07 DIAGNOSIS — N179 Acute kidney failure, unspecified: Secondary | ICD-10-CM | POA: Diagnosis not present

## 2016-07-07 DIAGNOSIS — I272 Pulmonary hypertension, unspecified: Secondary | ICD-10-CM | POA: Diagnosis not present

## 2016-07-07 DIAGNOSIS — I509 Heart failure, unspecified: Secondary | ICD-10-CM | POA: Diagnosis not present

## 2016-07-07 DIAGNOSIS — I482 Chronic atrial fibrillation: Secondary | ICD-10-CM | POA: Diagnosis not present

## 2016-07-07 DIAGNOSIS — Z7984 Long term (current) use of oral hypoglycemic drugs: Secondary | ICD-10-CM | POA: Diagnosis not present

## 2016-07-07 DIAGNOSIS — E43 Unspecified severe protein-calorie malnutrition: Secondary | ICD-10-CM | POA: Diagnosis not present

## 2016-07-07 DIAGNOSIS — M3130 Wegener's granulomatosis without renal involvement: Secondary | ICD-10-CM | POA: Diagnosis not present

## 2016-07-07 DIAGNOSIS — Z9981 Dependence on supplemental oxygen: Secondary | ICD-10-CM | POA: Diagnosis not present

## 2016-07-08 ENCOUNTER — Telehealth: Payer: Self-pay

## 2016-07-08 NOTE — Telephone Encounter (Signed)
D/C 07/05/16 To: home  Spoke with pt and she states that she is doing very well. Her incision site is healing well. She is changing bandages on a regular basis. She is able to complete basic ADL's with minimal assistance. She has no questions or concerns at this time.   Appt scheduled with Dr. Elease Hashimoto 4/6.18, pt aware.   Transition Care Management Follow-up Telephone Call  How have you been since you were released from the hospital? Doing very well   Do you understand why you were in the hospital? yes   Do you understand the discharge instrcutions? yes  Items Reviewed:  Medications reviewed: yes  Allergies reviewed: yes  Dietary changes reviewed: yes  Referrals reviewed: yes   Functional Questionnaire:   Activities of Daily Living (ADLs):   She states they are independent in the following: ambulation, bathing and hygiene, feeding, continence, grooming, toileting and dressing States they require assistance with the following: none   Any transportation issues/concerns?: no   Any patient concerns? no   Confirmed importance and date/time of follow-up visits scheduled: yes   Confirmed with patient if condition begins to worsen call PCP or go to the ER.  Patient was given the Call-a-Nurse line [phone]: yes

## 2016-07-09 DIAGNOSIS — N183 Chronic kidney disease, stage 3 (moderate): Secondary | ICD-10-CM | POA: Diagnosis not present

## 2016-07-09 DIAGNOSIS — N179 Acute kidney failure, unspecified: Secondary | ICD-10-CM | POA: Diagnosis not present

## 2016-07-09 DIAGNOSIS — E1122 Type 2 diabetes mellitus with diabetic chronic kidney disease: Secondary | ICD-10-CM | POA: Diagnosis not present

## 2016-07-09 DIAGNOSIS — Z48812 Encounter for surgical aftercare following surgery on the circulatory system: Secondary | ICD-10-CM | POA: Diagnosis not present

## 2016-07-09 DIAGNOSIS — E43 Unspecified severe protein-calorie malnutrition: Secondary | ICD-10-CM | POA: Diagnosis not present

## 2016-07-09 DIAGNOSIS — I482 Chronic atrial fibrillation: Secondary | ICD-10-CM | POA: Diagnosis not present

## 2016-07-11 ENCOUNTER — Ambulatory Visit (INDEPENDENT_AMBULATORY_CARE_PROVIDER_SITE_OTHER): Payer: Medicare Other | Admitting: Family Medicine

## 2016-07-11 ENCOUNTER — Encounter: Payer: Self-pay | Admitting: Family Medicine

## 2016-07-11 VITALS — BP 120/70 | HR 60 | Temp 97.7°F | Wt 159.5 lb

## 2016-07-11 DIAGNOSIS — M3130 Wegener's granulomatosis without renal involvement: Secondary | ICD-10-CM

## 2016-07-11 DIAGNOSIS — I5032 Chronic diastolic (congestive) heart failure: Secondary | ICD-10-CM | POA: Diagnosis not present

## 2016-07-11 DIAGNOSIS — K460 Unspecified abdominal hernia with obstruction, without gangrene: Secondary | ICD-10-CM | POA: Diagnosis not present

## 2016-07-11 DIAGNOSIS — E1121 Type 2 diabetes mellitus with diabetic nephropathy: Secondary | ICD-10-CM | POA: Diagnosis not present

## 2016-07-11 DIAGNOSIS — E038 Other specified hypothyroidism: Secondary | ICD-10-CM

## 2016-07-11 DIAGNOSIS — N184 Chronic kidney disease, stage 4 (severe): Secondary | ICD-10-CM

## 2016-07-11 DIAGNOSIS — N189 Chronic kidney disease, unspecified: Secondary | ICD-10-CM

## 2016-07-11 DIAGNOSIS — I10 Essential (primary) hypertension: Secondary | ICD-10-CM | POA: Diagnosis not present

## 2016-07-11 LAB — TSH: TSH: 3.16 mIU/L

## 2016-07-11 NOTE — Patient Instructions (Signed)
Call Dr Marlowe Aschoff office to set up follow up [phone] Let's plan on 3 month follow up.

## 2016-07-11 NOTE — Progress Notes (Signed)
Pre visit review using our clinic review tool, if applicable. No additional management support is needed unless otherwise documented below in the visit note.

## 2016-07-11 NOTE — Progress Notes (Signed)
Subjective:     Patient ID: [name] [name], female   DOB: 11-10-1932, 83 y.o.   MRN: [id number]  HPI Patient seen for hospital follow-up-TCM follow up.  Her chronic problems include history of hypothyroidism, type 2 diabetes, obesity, hypertension, atrial fibrillation, diastolic heart failure, Wegener's granulomatosis, chronic kidney disease. She was admitted on March 26 after she presented with nausea and vomiting and abdominal distention and pain. She had known history of ventral hernia which had been previously evaluated by surgeon. On exam in the ER she had ventral hernia which was nonreducible and tender to palpation. Abdominal films revealed air-fluid levels suggesting some bowel obstruction. CT abdomen showed right sided lower abdominal wall hernia containing part of the transverse colon.  Patient was taken to surgery for incarcerated abdominal wall hernia. Surgery was without complication. She had in G-tube removed fairly promptly and was eating prior to discharge. She has done well since then. Her appetite is still poor but she is eating fairly well. No abdominal pain other than mild abdominal wall tenderness. Staples in place. She does not have follow-up with surgeon yet.  She's back on her usual medications. Diabetes has been well controlled with A1c 6.0%.  Hypothyroidism. Last TSH was low at 0.19 last fall. We reduced her levothyroxine to 100 g daily.  Past Medical History:  Diagnosis Date  . Arthritis    r tkr  . Breast cancer ([place])   . Chronic atrial fibrillation (HCC)    CHADS2-VASc=6.  on low dose Eliquis (corrected for age & renal fxn)  . CVA (cerebral vascular accident) ([place]) 2008   mini stroke.no residual  . DEPRESSION 05/16/2009  . Diabetes mellitus type II   . Heart murmur    stress test 2009.dr peter [place]  . HYPERLIPIDEMIA 07/24/2008  . HYPERTENSION 07/24/2008  . HYPOTHYROIDISM 07/24/2008  . Intermittent vertigo   . Pneumonia   . Pulmonary hypertension    Mod PHTN on  Echo 03/2016: 2/2 combination of Wegener's, HFPF & Afib.  . Skin abnormality    facial lesions .pt applying mupiracin to areas  . Vertigo   . Wegener's disease, pulmonary ([place]) 10/2012   Past Surgical History:  Procedure Laterality Date  . BREAST SURGERY  2007   Lumpectomy, XRT 2006.l breast  . CHOLECYSTECTOMY  1980  . EXPLORATORY LAPAROTOMY    . HEEL SPUR SURGERY Right   . JOINT REPLACEMENT     r knee  . KNEE SURGERY  2009   TKR  . TRANSTHORACIC ECHOCARDIOGRAM  03/2016   Afib (no Diastolic Fxn). EF 55-60%. No RWMA. Mild Aortic dilation Mild MR. Severe LA dilation. Mod RA dilation.  Mod TR with Mod-Severe Pulmonary HTN - but normal RV function..  . VENTRAL HERNIA REPAIR N/A 07/01/2016   Procedure: REPAIR OF INCARCERATED INCISIONAL HERNIA ;  Surgeon: Stark Klein, MD;  Location: [facility];  Service: General;  Laterality: N/A;  . VIDEO ASSISTED THORACOSCOPY  04/22/2011   Procedure: VIDEO ASSISTED THORACOSCOPY;  Surgeon: Melrose Nakayama, MD;  Location: [facility];  Service: Thoracic;  Laterality: Left;  WITH BIOPSY    reports that she quit smoking about 34 years ago. Her smoking use included Cigarettes. She has a 6.00 pack-year smoking history. She has never used smokeless tobacco. She reports that she does not drink alcohol or use drugs. family history includes Arthritis in her mother; Breast cancer in her sister; Cancer in her sister; Coronary artery disease in her brother and father; Heart disease in her father; Lung cancer in  her brother; Rheum arthritis in her mother. Allergies  Allergen Reactions  . Codeine Sulfate Other (See Comments)    REACTION: GI upset  . Sulfonamide Derivatives Other (See Comments)    REACTION: GI upset  . Vancomycin Other (See Comments)    Red man syndrome  . Atarax [Hydroxyzine] Nausea Only and Rash     Review of Systems  Constitutional: Positive for appetite change. Negative for fatigue and unexpected weight change.  Eyes: Negative for visual  disturbance.  Respiratory: Negative for cough, chest tightness, shortness of breath and wheezing.   Cardiovascular: Negative for chest pain, palpitations and leg swelling.  Gastrointestinal: Negative for abdominal pain, blood in stool, constipation, diarrhea, nausea and vomiting.  Endocrine: Negative for polydipsia and polyuria.  Neurological: Negative for dizziness, seizures, syncope, weakness, light-headedness and headaches.       Objective:   Physical Exam  Constitutional: She appears well-developed and well-nourished.  Neck: Neck supple. No thyromegaly present.  Cardiovascular:  Irregular rhythm, rate controlled  Abdominal: Soft. Bowel sounds are normal. She exhibits no distension. There is no tenderness.  Incision looks good. There is no erythema. No warmth. No drainage. Staples intact.  Musculoskeletal: She exhibits no edema.  Lymphadenopathy:    She has no cervical adenopathy.       Assessment:     #1 recent incarcerated abdominal wall hernia status post repair and patient stable and doing well  #2 type 2 diabetes which has been well controlled  #3 hypothyroidism slightly over replaced by most recent lab  #4 chronic atrial fibrillation- rate controlled on chronic anticoagulation  #5 hypertension stable and at goal  #6 history of Wegener's granulomatosis  #7 history of recurrent depression stable  #8 chronic kidney disease  #History of diastolic heart failure    Plan:     -Recheck TSH -Patient is instructed to schedule follow-up with surgeon as she has not been back yet -Continue daily dressing changes and follow-up promptly for signs of secondary infection of wound -Routine follow-up in 3 months and sooner as needed  [name] MD [facility]

## 2016-07-14 DIAGNOSIS — E1122 Type 2 diabetes mellitus with diabetic chronic kidney disease: Secondary | ICD-10-CM | POA: Diagnosis not present

## 2016-07-14 DIAGNOSIS — N183 Chronic kidney disease, stage 3 (moderate): Secondary | ICD-10-CM | POA: Diagnosis not present

## 2016-07-14 DIAGNOSIS — Z48812 Encounter for surgical aftercare following surgery on the circulatory system: Secondary | ICD-10-CM | POA: Diagnosis not present

## 2016-07-14 DIAGNOSIS — I482 Chronic atrial fibrillation: Secondary | ICD-10-CM | POA: Diagnosis not present

## 2016-07-14 DIAGNOSIS — N179 Acute kidney failure, unspecified: Secondary | ICD-10-CM | POA: Diagnosis not present

## 2016-07-14 DIAGNOSIS — E43 Unspecified severe protein-calorie malnutrition: Secondary | ICD-10-CM | POA: Diagnosis not present

## 2016-07-16 DIAGNOSIS — E1122 Type 2 diabetes mellitus with diabetic chronic kidney disease: Secondary | ICD-10-CM | POA: Diagnosis not present

## 2016-07-16 DIAGNOSIS — N179 Acute kidney failure, unspecified: Secondary | ICD-10-CM | POA: Diagnosis not present

## 2016-07-16 DIAGNOSIS — I482 Chronic atrial fibrillation: Secondary | ICD-10-CM | POA: Diagnosis not present

## 2016-07-16 DIAGNOSIS — Z48812 Encounter for surgical aftercare following surgery on the circulatory system: Secondary | ICD-10-CM | POA: Diagnosis not present

## 2016-07-16 DIAGNOSIS — N183 Chronic kidney disease, stage 3 (moderate): Secondary | ICD-10-CM | POA: Diagnosis not present

## 2016-07-16 DIAGNOSIS — E43 Unspecified severe protein-calorie malnutrition: Secondary | ICD-10-CM | POA: Diagnosis not present

## 2016-07-21 ENCOUNTER — Other Ambulatory Visit: Payer: Self-pay | Admitting: Internal Medicine

## 2016-07-21 DIAGNOSIS — E1122 Type 2 diabetes mellitus with diabetic chronic kidney disease: Secondary | ICD-10-CM | POA: Diagnosis not present

## 2016-07-21 DIAGNOSIS — Z48812 Encounter for surgical aftercare following surgery on the circulatory system: Secondary | ICD-10-CM | POA: Diagnosis not present

## 2016-07-21 DIAGNOSIS — N183 Chronic kidney disease, stage 3 (moderate): Secondary | ICD-10-CM | POA: Diagnosis not present

## 2016-07-21 DIAGNOSIS — N179 Acute kidney failure, unspecified: Secondary | ICD-10-CM | POA: Diagnosis not present

## 2016-07-21 DIAGNOSIS — I482 Chronic atrial fibrillation: Secondary | ICD-10-CM | POA: Diagnosis not present

## 2016-07-21 DIAGNOSIS — E43 Unspecified severe protein-calorie malnutrition: Secondary | ICD-10-CM | POA: Diagnosis not present

## 2016-07-24 ENCOUNTER — Ambulatory Visit: Payer: Medicare Other | Admitting: Internal Medicine

## 2016-07-24 DIAGNOSIS — N179 Acute kidney failure, unspecified: Secondary | ICD-10-CM | POA: Diagnosis not present

## 2016-07-24 DIAGNOSIS — E1122 Type 2 diabetes mellitus with diabetic chronic kidney disease: Secondary | ICD-10-CM | POA: Diagnosis not present

## 2016-07-24 DIAGNOSIS — Z48812 Encounter for surgical aftercare following surgery on the circulatory system: Secondary | ICD-10-CM | POA: Diagnosis not present

## 2016-07-24 DIAGNOSIS — I482 Chronic atrial fibrillation: Secondary | ICD-10-CM | POA: Diagnosis not present

## 2016-07-24 DIAGNOSIS — E43 Unspecified severe protein-calorie malnutrition: Secondary | ICD-10-CM | POA: Diagnosis not present

## 2016-07-24 DIAGNOSIS — N183 Chronic kidney disease, stage 3 (moderate): Secondary | ICD-10-CM | POA: Diagnosis not present

## 2016-07-30 DIAGNOSIS — N179 Acute kidney failure, unspecified: Secondary | ICD-10-CM | POA: Diagnosis not present

## 2016-07-30 DIAGNOSIS — E43 Unspecified severe protein-calorie malnutrition: Secondary | ICD-10-CM | POA: Diagnosis not present

## 2016-07-30 DIAGNOSIS — Z48812 Encounter for surgical aftercare following surgery on the circulatory system: Secondary | ICD-10-CM | POA: Diagnosis not present

## 2016-07-30 DIAGNOSIS — N183 Chronic kidney disease, stage 3 (moderate): Secondary | ICD-10-CM | POA: Diagnosis not present

## 2016-07-30 DIAGNOSIS — I482 Chronic atrial fibrillation: Secondary | ICD-10-CM | POA: Diagnosis not present

## 2016-07-30 DIAGNOSIS — E1122 Type 2 diabetes mellitus with diabetic chronic kidney disease: Secondary | ICD-10-CM | POA: Diagnosis not present

## 2016-08-04 ENCOUNTER — Ambulatory Visit (INDEPENDENT_AMBULATORY_CARE_PROVIDER_SITE_OTHER): Payer: Medicare Other | Admitting: Internal Medicine

## 2016-08-04 ENCOUNTER — Encounter: Payer: Self-pay | Admitting: Internal Medicine

## 2016-08-04 VITALS — BP 138/72 | HR 74 | Resp 16 | Ht 66.0 in | Wt 168.2 lb

## 2016-08-04 DIAGNOSIS — J410 Simple chronic bronchitis: Secondary | ICD-10-CM

## 2016-08-04 DIAGNOSIS — C7A029 Malignant carcinoid tumor of the large intestine, unspecified portion: Secondary | ICD-10-CM | POA: Diagnosis not present

## 2016-08-04 DIAGNOSIS — I482 Chronic atrial fibrillation, unspecified: Secondary | ICD-10-CM

## 2016-08-04 DIAGNOSIS — M3130 Wegener's granulomatosis without renal involvement: Secondary | ICD-10-CM | POA: Diagnosis not present

## 2016-08-04 NOTE — Progress Notes (Signed)
HPI F former smoker followed for bilateral pulmonary infiltrates/nodules, increased see-Anka, status post VATS biopsy/granulomatous polyangiitis (Wegener's) versus cryptogenic organizing pneumonia, complicated by metastatic intestinal carcinoid, history breast CVA 2006, A. fib/PE, HTN  --------------------------------------------------------------------------------------------  01/24/2016- 83 year old female former smoker followed for bilateral pulmonary infiltrates/nodules, increased C-ANCA, status post VATS biopsy/granulomatous polyangiitis (Wegener's) versus cryptogenic organizing pneumonia/, metastatic intestinal carcinoid, complicated by history breast CA 2006, A. fib/PE HTN/,, DM O2 2 L [facility] FOLLOWS FOR: Pt. had her ONO, Pt. has a slight cough, Pt. deneis wheezing, chest pain  Overnight oximetry in December confirmed ongoing significant desaturation justifying continued use of oxygen for sleep. She has continued prednisone maintenance 5 mg daily for diagnosis of Wegener's. Status of this condition is unclear. She is coughing a little just with recent cool weather change but otherwise no routine cough, wheeze or respiratory discomfort. Want to relook at the status of her Wegener's diagnosis. No known recurrence of carcinoid.  08/04/16- 83 year old female former smoker followed for bilateral pulmonary infiltrates/nodules, increased C-ANCA, status post VATS biopsy/granulomatous polyangiitis (Wegener's) versus cryptogenic organizing pneumonia/, metastatic intestinal carcinoid, complicated by history breast CA 2006, A. fib/PE HTN/,, DM O2 2 L [facility] 3/26-3/31 for incarcerated abdominal wall hernia Continues maintenance prednisone 5 mg daily with little respiratory problem since starting that program. FOLLOWS UEA:[id number] stating that she is feeling very well. patient is SOB only when moving around to much.  Abdomen is still tender after abdominal hernia repair without mesh, but  it is not disturbing her sleep much. She came with her son today. Husband progressively more impaired by Alzheimer's. Breathing has been very comfortable with little cough. She is not active enough to notice exertional dyspnea. No acute chest symptoms. Continues oxygen all night every night for sleep. CXR 01/24/16-IMPRESSION: No active cardiopulmonary disease.  ROS-see HPI    + = pos Constitutional:   No-   weight loss, night sweats, fevers, chills, +fatigue, lassitude. HEENT:   No-  headaches, difficulty swallowing, tooth/dental problems, sore throat,       No-  sneezing, itching, ear ache, nasal congestion, post nasal drip,  CV: No -chest pain, orthopnea, PND, swelling in lower extremities, anasarca,    No-palpitations Resp:   shortness of breath with exertion or at rest.           No- productive cough,  No-non-productive cough,               No-   change in color of mucus.  No- wheezing.   Skin: No-   rash or lesions. no- pruritus GI:  No-   heartburn, indigestion, abdominal pain, nausea, vomiting,  GU: MS:  No-   joint pain or swelling.  Neuro-     +Vertigo- uses wheelchair Psych:  No- change in mood or affect. No depression or anxiety.  No memory loss.  OBJ General- Alert, Oriented, Affect-appropriate, Distress- none acute, overweight, +wheelchair     Skin- rash-none, lesions- none, excoriation- none Lymphadenopathy- none Head- atraumatic            Eyes- Gross vision intact, PERRLA, conjunctivae -pale            Ears- Hearing, canals-normal            Nose- turbinate edema, no-Septal dev, mucus, polyps, erosion, perforation             Throat- Mallampati II-III , mucosa clear , drainage- none, tonsils- atrophic Neck- flexible , trachea midline, no stridor , thyroid nl, carotid no bruit Chest -  symmetrical excursion , unlabored           Heart/CV-+ slow irregularly irregular/ AFib , 2-3/6 precordial systolic murmur , no gallop  , no rub, Nl s1 s2                 - JVD+full ,  edema-none, stasis changes- none, varices- none           Lung- + bilateral squeaks and a few crackles, no wheeze , cough-none , dullness-none, rub- none           Chest wall- healed VATS thoracotomy incision L Abd-  abdominal binder+ Br/ Gen/ Rectal- Not done, not indicated Extrem- cyanosis- none, clubbing, none, atrophy- none, strength- nl. + Wheelchair for distance/ cane at home.  Neuro- grossly intact to observation

## 2016-08-04 NOTE — Patient Instructions (Signed)
Suggest you try taking the prednisone 1 every other day now.  Please call as needed

## 2016-08-05 DIAGNOSIS — Z48812 Encounter for surgical aftercare following surgery on the circulatory system: Secondary | ICD-10-CM | POA: Diagnosis not present

## 2016-08-05 DIAGNOSIS — N179 Acute kidney failure, unspecified: Secondary | ICD-10-CM | POA: Diagnosis not present

## 2016-08-05 DIAGNOSIS — E1122 Type 2 diabetes mellitus with diabetic chronic kidney disease: Secondary | ICD-10-CM | POA: Diagnosis not present

## 2016-08-05 DIAGNOSIS — E43 Unspecified severe protein-calorie malnutrition: Secondary | ICD-10-CM | POA: Diagnosis not present

## 2016-08-05 DIAGNOSIS — N183 Chronic kidney disease, stage 3 (moderate): Secondary | ICD-10-CM | POA: Diagnosis not present

## 2016-08-05 DIAGNOSIS — I482 Chronic atrial fibrillation: Secondary | ICD-10-CM | POA: Diagnosis not present

## 2016-08-05 NOTE — Assessment & Plan Note (Signed)
Symptomatically very stable. Plan-reduce prednisone to 5 mg every other day

## 2016-08-05 NOTE — Assessment & Plan Note (Signed)
Pulse is regular or very nearly so at this exam

## 2016-08-05 NOTE — Assessment & Plan Note (Signed)
Is hard to tell if low-dose maintenance prednisone is still necessary or helpful. I explained why we would like to reduce it if possible. Plan-reduced maintenance prednisone to 5 mg every other day if tolerated.

## 2016-08-20 ENCOUNTER — Other Ambulatory Visit: Payer: Self-pay | Admitting: Family Medicine

## 2016-08-20 ENCOUNTER — Other Ambulatory Visit: Payer: Self-pay | Admitting: Internal Medicine

## 2016-09-04 ENCOUNTER — Telehealth: Payer: Self-pay | Admitting: Family Medicine

## 2016-09-04 MED ORDER — FUROSEMIDE 20 MG PO TABS: 20.0000 mg | ORAL_TABLET | Freq: Every day | ORAL | 2 refills | Status: DC

## 2016-09-04 NOTE — Telephone Encounter (Signed)
Pt needs refill on furosemide 20 mg #90 w/refills [facility] [address]

## 2016-09-26 ENCOUNTER — Ambulatory Visit (INDEPENDENT_AMBULATORY_CARE_PROVIDER_SITE_OTHER): Payer: Medicare Other | Admitting: Family Medicine

## 2016-09-26 ENCOUNTER — Encounter: Payer: Self-pay | Admitting: Family Medicine

## 2016-09-26 VITALS — BP 110/80 | HR 74 | Temp 99.0°F | Wt 167.1 lb

## 2016-09-26 DIAGNOSIS — J988 Other specified respiratory disorders: Secondary | ICD-10-CM

## 2016-09-26 MED ORDER — BENZONATATE 100 MG PO CAPS: 100.0000 mg | ORAL_CAPSULE | Freq: Three times a day (TID) | ORAL | 0 refills | Status: DC | PRN

## 2016-09-26 NOTE — Patient Instructions (Signed)
INSTRUCTIONS FOR UPPER RESPIRATORY INFECTION:  -plenty of rest and fluids  -nasal saline wash 2-3 times daily (use prepackaged nasal saline or bottled/distilled water if making your own)   -can use tylenol (in no history of liver disease)  as directed for aches and sorethroat  -if you are taking a cough medication - use only as directed, may also try a teaspoon of honey to coat the throat and throat lozenges. I sent tessalon to the pharmacy to use per instructions as needed as well.  -for sore throat, salt water gargles can help  -follow up if you have fevers, facial pain, tooth pain, difficulty breathing or are worsening or symptoms persist over the next 3-5 days  Upper Respiratory Infection, Adult An upper respiratory infection (URI) is also known as the common cold. It is often caused by a type of germ (virus). Colds are easily spread (contagious). You can pass it to others by kissing, coughing, sneezing, or drinking out of the same glass. Usually, you get better in 1 to 3  weeks.  However, the cough can last for even longer. HOME CARE   Only take medicine as told by your doctor. Follow instructions provided above.  Drink enough water and fluids to keep your pee (urine) clear or pale yellow.  Get plenty of rest.  Return to work when your temperature is < 100 for 24 hours or as told by your doctor. You may use a face mask and wash your hands to stop your cold from spreading. GET HELP RIGHT AWAY IF:   After the first few days, you feel you are getting worse.  You have questions about your medicine.  You have chills, shortness of breath, or red spit (mucus).  You have pain in the face for more then 1-2 days, especially when you bend forward.  You have a fever, puffy (swollen) neck, pain when you swallow, or white spots in the back of your throat.  You have a bad headache, ear pain, sinus pain, or chest pain.  You have a high-pitched whistling sound when you breathe in and out  (wheezing).  You cough up blood.  You have sore muscles or a stiff neck. MAKE SURE YOU:   Understand these instructions.  Will watch your condition.  Will get help right away if you are not doing well or get worse. Document Released: 09/10/2007 Document Revised: 06/16/2011 Document Reviewed: 06/29/2013 [facility] Patient Information 2015 [place], [place]. This information is not intended to replace advice given to you by your health care provider. Make sure you discuss any questions you have with your health care provider.

## 2016-09-26 NOTE — Progress Notes (Signed)
HPI:  URI: -started: 3 days ago -symptoms:nasal congestion, sore throat, scratchy throat, R ear pain, chills last night, cough -denies:fever, SOB, NVD, tooth pain -has tried: nothing -sick contacts/travel/risks: no reported flu, strep or tick exposure -Hx of: Wagener's  ROS: See pertinent positives and negatives per HPI.  Past Medical History:  Diagnosis Date  . Arthritis    r tkr  . Breast cancer ([place])   . Chronic atrial fibrillation (HCC)    CHADS2-VASc=6.  on low dose Eliquis (corrected for age & renal fxn)  . CVA (cerebral vascular accident) ([place]) 2008   mini stroke.no residual  . DEPRESSION 05/16/2009  . Diabetes mellitus type II   . Heart murmur    stress test 2009.dr peter [place]  . HYPERLIPIDEMIA 07/24/2008  . HYPERTENSION 07/24/2008  . HYPOTHYROIDISM 07/24/2008  . Intermittent vertigo   . Pneumonia   . Pulmonary hypertension ([place])    Mod PHTN on Echo 03/2016: 2/2 combination of Wegener's, HFPF & Afib.  . Skin abnormality    facial lesions .pt applying mupiracin to areas  . Vertigo   . Wegener's disease, pulmonary ([place]) 10/2012    Past Surgical History:  Procedure Laterality Date  . BREAST SURGERY  2007   Lumpectomy, XRT 2006.l breast  . CHOLECYSTECTOMY  1980  . EXPLORATORY LAPAROTOMY    . HEEL SPUR SURGERY Right   . JOINT REPLACEMENT     r knee  . KNEE SURGERY  2009   TKR  . TRANSTHORACIC ECHOCARDIOGRAM  03/2016   Afib (no Diastolic Fxn). EF 55-60%. No RWMA. Mild Aortic dilation Mild MR. Severe LA dilation. Mod RA dilation.  Mod TR with Mod-Severe Pulmonary HTN - but normal RV function..  . VENTRAL HERNIA REPAIR N/A 07/01/2016   Procedure: REPAIR OF INCARCERATED INCISIONAL HERNIA ;  Surgeon: Stark Klein, MD;  Location: [facility];  Service: General;  Laterality: N/A;  . VIDEO ASSISTED THORACOSCOPY  04/22/2011   Procedure: VIDEO ASSISTED THORACOSCOPY;  Surgeon: Melrose Nakayama, MD;  Location: [facility];  Service: Thoracic;  Laterality: Left;  WITH BIOPSY     Family History  Problem Relation Age of Onset  . Arthritis Mother   . Rheum arthritis Mother   . Heart disease Father   . Coronary artery disease Father   . Cancer Sister        breast CA, both sisters  . Breast cancer Sister   . Coronary artery disease Brother   . Lung cancer Brother     Social History   Social History  . Marital status: Married    Spouse name: N/A  . Number of children: N/A  . Years of education: N/A   Social History Main Topics  . Smoking status: Former Smoker    Packs/day: 0.50    Years: 12.00    Types: Cigarettes    Quit date: 06/04/1982  . Smokeless tobacco: Never Used  . Alcohol use No  . Drug use: No  . Sexual activity: Yes    Birth control/ protection: Post-menopausal   Other Topics Concern  . None   Social History Narrative   Lives with spouse who now has alz x 62 years   3 sons      Grand children; 77; 4 grandson and 3 granddtr   [employer]; 5 great grandson and 3 great granddtr   The youngest is 6 months      Current Outpatient Prescriptions:  .  amLODipine (NORVASC) 5 MG tablet, TAKE ONE TABLET BY MOUTH ONCE  DAILY., Disp: 90 tablet, Rfl: 2 .  BYSTOLIC 10 MG tablet, TAKE ONE TABLET BY MOUTH ONCE DAILY AFTER  BREAKFAST. (Patient taking differently: TAKE 48m TABLET BY MOUTH ONCE DAILY AFTER  BREAKFAST.), Disp: 30 tablet, Rfl: 11 .  diazepam (VALIUM) 5 MG tablet, Take one tablet by mouth as needed for severe vertigo flares (Patient taking differently: Take 5 mg by mouth daily as needed (severe vertigo..Marland Kitchen. ), Disp: 20 tablet, Rfl: 0 .  ELIQUIS 2.5 MG TABS tablet, TAKE ONE TABLET BY MOUTH TWICE DAILY (Patient taking differently: TAKE 2.555mTABLET BY MOUTH TWICE DAILY), Disp: 180 tablet, Rfl: 1 .  escitalopram (LEXAPRO) 10 MG tablet, Take 1 tablet (10 mg total) by mouth daily., Disp: 30 tablet, Rfl: 6 .  furosemide (LASIX) 20 MG tablet, Take 1 tablet (20 mg total) by mouth daily., Disp: 90 tablet, Rfl: 2 .  gabapentin (NEURONTIN) 100  MG capsule, TAKE ONE CAPSULE BY MOUTH TWICE DAILY BEFORE  A  MEAL, Disp: 60 capsule, Rfl: 5 .  glimepiride (AMARYL) 2 MG tablet, Take 1 tablet (2 mg total) by mouth daily with breakfast. TAKE ONE TABLET BY MOUTH ONCE DAILY BEFORE BREAKFAST, Disp: 60 tablet, Rfl: 0 .  levothyroxine (SYNTHROID, LEVOTHROID) 100 MCG tablet, Take 1 tablet (100 mcg total) by mouth daily before breakfast., Disp: 90 tablet, Rfl: 2 .  predniSONE (DELTASONE) 5 MG tablet, TAKE 1 TABLET BY MOUTH ONCE DAILY WITH  BREAKFAST, Disp: 30 tablet, Rfl: 0 .  triamcinolone cream (KENALOG) 0.1 %, Apply 1 application topically 2 (two) times daily as needed., Disp: 453 g, Rfl: 1 .  benzonatate (TESSALON PERLES) 100 MG capsule, Take 1 capsule (100 mg total) by mouth 3 (three) times daily as needed., Disp: 20 capsule, Rfl: 0  EXAM:  Vitals:   09/26/16 1600  BP: 110/80  Pulse: 74  Temp: 99 F (37.2 C)    Body mass index is 26.97 kg/m.  GENERAL: vitals reviewed and listed above, alert, oriented, appears well hydrated and in no acute distress  HEENT: atraumatic, conjunttiva clear, no obvious abnormalities on inspection of external nose and ears, normal appearance of ear canals and TMs except for clear effusion R, clear nasal congestion, mild post oropharyngeal erythema with PND, no tonsillar edema or exudate, no sinus TTP  NECK: no obvious masses on inspection  LUNGS: clear to auscultation bilaterally, no  rales or rhonchi, good air movement   CV: HRRR, no peripheral edema  MS: moves all extremities without noticeable abnormality  PSYCH: pleasant and cooperative, no obvious depression or anxiety  ASSESSMENT AND PLAN:  Discussed the following assessment and plan:  Respiratory infection  -given HPI and exam findings today, a serious infection or illness is unlikely. We discussed potential etiologies, with VURI being most likely, and advised supportive care and monitoring. Offered CXR given hx and cough though findings on  exam support an upper resp illness. They opted to try tessalon and symptomatic care and call if any worsening or not improving as expected.We discussed treatment side effects, likely course, transmission, and signs of developing a serious illness. -of course, we advised to return or notify a doctor immediately if symptoms worsen or persist or new concerns arise.    Patient Instructions  INSTRUCTIONS FOR UPPER RESPIRATORY INFECTION:  -plenty of rest and fluids  -nasal saline wash 2-3 times daily (use prepackaged nasal saline or bottled/distilled water if making your own)   -can use tylenol (in no history of liver disease)  as directed for aches and sorethroat  -  if you are taking a cough medication - use only as directed, may also try a teaspoon of honey to coat the throat and throat lozenges. I sent tessalon to the pharmacy to use per instructions as needed as well.  -for sore throat, salt water gargles can help  -follow up if you have fevers, facial pain, tooth pain, difficulty breathing or are worsening or symptoms persist over the next 3-5 days  Upper Respiratory Infection, Adult An upper respiratory infection (URI) is also known as the common cold. It is often caused by a type of germ (virus). Colds are easily spread (contagious). You can pass it to others by kissing, coughing, sneezing, or drinking out of the same glass. Usually, you get better in 1 to 3  weeks.  However, the cough can last for even longer. HOME CARE   Only take medicine as told by your doctor. Follow instructions provided above.  Drink enough water and fluids to keep your pee (urine) clear or pale yellow.  Get plenty of rest.  Return to work when your temperature is < 100 for 24 hours or as told by your doctor. You may use a face mask and wash your hands to stop your cold from spreading. GET HELP RIGHT AWAY IF:   After the first few days, you feel you are getting worse.  You have questions about your  medicine.  You have chills, shortness of breath, or red spit (mucus).  You have pain in the face for more then 1-2 days, especially when you bend forward.  You have a fever, puffy (swollen) neck, pain when you swallow, or white spots in the back of your throat.  You have a bad headache, ear pain, sinus pain, or chest pain.  You have a high-pitched whistling sound when you breathe in and out (wheezing).  You cough up blood.  You have sore muscles or a stiff neck. MAKE SURE YOU:   Understand these instructions.  Will watch your condition.  Will get help right away if you are not doing well or get worse. Document Released: 09/10/2007 Document Revised: 06/16/2011 Document Reviewed: 06/29/2013 [facility] Patient Information 2015 [place], [place]. This information is not intended to replace advice given to you by your health care provider. Make sure you discuss any questions you have with your health care provider.    Colin Benton R., DO

## 2016-09-29 ENCOUNTER — Emergency Department (HOSPITAL_COMMUNITY): Payer: Medicare Other

## 2016-09-29 ENCOUNTER — Encounter (HOSPITAL_COMMUNITY): Payer: Self-pay | Admitting: Emergency Medicine

## 2016-09-29 ENCOUNTER — Inpatient Hospital Stay (HOSPITAL_COMMUNITY)
Admission: EM | Admit: 2016-09-29 | Discharge: 2016-10-01 | DRG: 193 | Disposition: A | Discharge status: Home Health | Payer: Medicare Other | Attending: Nephrology | Admitting: Nephrology

## 2016-09-29 DIAGNOSIS — Z853 Personal history of malignant neoplasm of breast: Secondary | ICD-10-CM

## 2016-09-29 DIAGNOSIS — N183 Chronic kidney disease, stage 3 unspecified: Secondary | ICD-10-CM | POA: Diagnosis present

## 2016-09-29 DIAGNOSIS — Z7952 Long term (current) use of systemic steroids: Secondary | ICD-10-CM

## 2016-09-29 DIAGNOSIS — Z96651 Presence of right artificial knee joint: Secondary | ICD-10-CM | POA: Diagnosis present

## 2016-09-29 DIAGNOSIS — J410 Simple chronic bronchitis: Secondary | ICD-10-CM

## 2016-09-29 DIAGNOSIS — Z79890 Hormone replacement therapy (postmenopausal): Secondary | ICD-10-CM

## 2016-09-29 DIAGNOSIS — I482 Chronic atrial fibrillation, unspecified: Secondary | ICD-10-CM | POA: Diagnosis present

## 2016-09-29 DIAGNOSIS — J440 Chronic obstructive pulmonary disease with acute lower respiratory infection: Secondary | ICD-10-CM | POA: Diagnosis present

## 2016-09-29 DIAGNOSIS — J9621 Acute and chronic respiratory failure with hypoxia: Secondary | ICD-10-CM | POA: Diagnosis present

## 2016-09-29 DIAGNOSIS — R110 Nausea: Secondary | ICD-10-CM | POA: Diagnosis present

## 2016-09-29 DIAGNOSIS — Z885 Allergy status to narcotic agent status: Secondary | ICD-10-CM

## 2016-09-29 DIAGNOSIS — J189 Pneumonia, unspecified organism: Principal | ICD-10-CM | POA: Diagnosis present

## 2016-09-29 DIAGNOSIS — Z881 Allergy status to other antibiotic agents status: Secondary | ICD-10-CM | POA: Diagnosis not present

## 2016-09-29 DIAGNOSIS — E038 Other specified hypothyroidism: Secondary | ICD-10-CM | POA: Diagnosis not present

## 2016-09-29 DIAGNOSIS — E1122 Type 2 diabetes mellitus with diabetic chronic kidney disease: Secondary | ICD-10-CM | POA: Diagnosis present

## 2016-09-29 DIAGNOSIS — Z888 Allergy status to other drugs, medicaments and biological substances status: Secondary | ICD-10-CM | POA: Diagnosis not present

## 2016-09-29 DIAGNOSIS — E785 Hyperlipidemia, unspecified: Secondary | ICD-10-CM | POA: Diagnosis present

## 2016-09-29 DIAGNOSIS — I130 Hypertensive heart and chronic kidney disease with heart failure and stage 1 through stage 4 chronic kidney disease, or unspecified chronic kidney disease: Secondary | ICD-10-CM | POA: Diagnosis present

## 2016-09-29 DIAGNOSIS — Z87891 Personal history of nicotine dependence: Secondary | ICD-10-CM | POA: Diagnosis not present

## 2016-09-29 DIAGNOSIS — Z79899 Other long term (current) drug therapy: Secondary | ICD-10-CM | POA: Diagnosis not present

## 2016-09-29 DIAGNOSIS — J449 Chronic obstructive pulmonary disease, unspecified: Secondary | ICD-10-CM | POA: Diagnosis present

## 2016-09-29 DIAGNOSIS — I5032 Chronic diastolic (congestive) heart failure: Secondary | ICD-10-CM

## 2016-09-29 DIAGNOSIS — Z7901 Long term (current) use of anticoagulants: Secondary | ICD-10-CM | POA: Diagnosis not present

## 2016-09-29 DIAGNOSIS — E039 Hypothyroidism, unspecified: Secondary | ICD-10-CM | POA: Diagnosis present

## 2016-09-29 DIAGNOSIS — R0602 Shortness of breath: Secondary | ICD-10-CM | POA: Diagnosis not present

## 2016-09-29 DIAGNOSIS — R509 Fever, unspecified: Secondary | ICD-10-CM | POA: Diagnosis not present

## 2016-09-29 DIAGNOSIS — Z8673 Personal history of transient ischemic attack (TIA), and cerebral infarction without residual deficits: Secondary | ICD-10-CM

## 2016-09-29 DIAGNOSIS — M3130 Wegener's granulomatosis without renal involvement: Secondary | ICD-10-CM | POA: Diagnosis not present

## 2016-09-29 DIAGNOSIS — E1121 Type 2 diabetes mellitus with diabetic nephropathy: Secondary | ICD-10-CM

## 2016-09-29 DIAGNOSIS — J181 Lobar pneumonia, unspecified organism: Secondary | ICD-10-CM | POA: Diagnosis not present

## 2016-09-29 LAB — URINALYSIS, ROUTINE W REFLEX MICROSCOPIC
Bilirubin Urine: NEGATIVE
Glucose, UA: NEGATIVE mg/dL
Ketones, ur: 5 mg/dL — AB
Nitrite: NEGATIVE
Protein, ur: 100 mg/dL — AB
Specific Gravity, Urine: 1.014 (ref 1.005–1.030)
pH: 6 (ref 5.0–8.0)

## 2016-09-29 LAB — CBC WITH DIFFERENTIAL/PLATELET
Basophils Absolute: 0 10*3/uL (ref 0.0–0.1)
Basophils Relative: 0 %
Eosinophils Absolute: 0 10*3/uL (ref 0.0–0.7)
Eosinophils Relative: 0 %
HCT: 24.7 % — ABNORMAL LOW (ref 36.0–46.0)
Hemoglobin: 8.2 g/dL — ABNORMAL LOW (ref 12.0–15.0)
Lymphocytes Relative: 4 %
Lymphs Abs: 0.7 10*3/uL (ref 0.7–4.0)
MCH: 31.2 pg (ref 26.0–34.0)
MCHC: 33.2 g/dL (ref 30.0–36.0)
MCV: 93.9 fL (ref 78.0–100.0)
Monocytes Absolute: 1.5 10*3/uL — ABNORMAL HIGH (ref 0.1–1.0)
Monocytes Relative: 8 %
Neutro Abs: 16.6 10*3/uL — ABNORMAL HIGH (ref 1.7–7.7)
Neutrophils Relative %: 88 %
Platelets: 247 10*3/uL (ref 150–400)
RBC: 2.63 MIL/uL — ABNORMAL LOW (ref 3.87–5.11)
RDW: 13.7 % (ref 11.5–15.5)
WBC: 18.8 10*3/uL — ABNORMAL HIGH (ref 4.0–10.5)

## 2016-09-29 LAB — PROTIME-INR
INR: 2.14
INR: 2.12
Prothrombin Time: 24.3 seconds — ABNORMAL HIGH (ref 11.4–15.2)
Prothrombin Time: 24.1 seconds — ABNORMAL HIGH (ref 11.4–15.2)

## 2016-09-29 LAB — COMPREHENSIVE METABOLIC PANEL
ALT: 9 U/L — ABNORMAL LOW (ref 14–54)
AST: 20 U/L (ref 15–41)
Albumin: 3.4 g/dL — ABNORMAL LOW (ref 3.5–5.0)
Alkaline Phosphatase: 79 U/L (ref 38–126)
Anion gap: 11 (ref 5–15)
BUN: 30 mg/dL — ABNORMAL HIGH (ref 6–20)
CO2: 20 mmol/L — ABNORMAL LOW (ref 22–32)
Calcium: 8.2 mg/dL — ABNORMAL LOW (ref 8.9–10.3)
Chloride: 100 mmol/L — ABNORMAL LOW (ref 101–111)
Creatinine, Ser: 1.73 mg/dL — ABNORMAL HIGH (ref 0.44–1.00)
GFR calc Af Amer: 30 mL/min — ABNORMAL LOW (ref >60)
GFR calc non Af Amer: 26 mL/min — ABNORMAL LOW (ref >60)
Glucose, Bld: 169 mg/dL — ABNORMAL HIGH (ref 65–99)
Potassium: 4.1 mmol/L (ref 3.5–5.1)
Sodium: 131 mmol/L — ABNORMAL LOW (ref 135–145)
Total Bilirubin: 1.8 mg/dL — ABNORMAL HIGH (ref 0.3–1.2)
Total Protein: 7.7 g/dL (ref 6.5–8.1)

## 2016-09-29 LAB — GLUCOSE, CAPILLARY
GLUCOSE-CAPILLARY: 158 mg/dL — AB (ref 65–99)
GLUCOSE-CAPILLARY: 159 mg/dL — AB (ref 65–99)

## 2016-09-29 LAB — I-STAT CG4 LACTIC ACID, ED: Lactic Acid, Venous: 1.86 mmol/L (ref 0.5–1.9)

## 2016-09-29 LAB — TSH: TSH: 3.286 u[IU]/mL (ref 0.350–4.500)

## 2016-09-29 MED ORDER — DIAZEPAM 5 MG PO TABS
5.0000 mg | ORAL_TABLET | Freq: Every day | ORAL | Status: DC | PRN
  Administered 2016-09-29: 5 mg via ORAL
  Filled 2016-09-29: qty 1

## 2016-09-29 MED ORDER — PIPERACILLIN-TAZOBACTAM 3.375 G IVPB 30 MIN
3.3750 g | Freq: Once | INTRAVENOUS | Status: AC
  Administered 2016-09-29: 3.375 g via INTRAVENOUS
  Filled 2016-09-29: qty 50

## 2016-09-29 MED ORDER — SODIUM CHLORIDE 0.9% FLUSH
3.0000 mL | Freq: Two times a day (BID) | INTRAVENOUS | Status: DC
  Administered 2016-09-29 – 2016-10-01 (×4): 3 mL via INTRAVENOUS

## 2016-09-29 MED ORDER — ESCITALOPRAM OXALATE 10 MG PO TABS
10.0000 mg | ORAL_TABLET | Freq: Every day | ORAL | Status: DC
  Administered 2016-09-29 – 2016-10-01 (×2): 10 mg via ORAL
  Filled 2016-09-29 (×3): qty 1

## 2016-09-29 MED ORDER — ONDANSETRON HCL 4 MG PO TABS
4.0000 mg | ORAL_TABLET | Freq: Four times a day (QID) | ORAL | Status: DC | PRN
  Administered 2016-09-29: 4 mg via ORAL
  Filled 2016-09-29: qty 1

## 2016-09-29 MED ORDER — PREDNISONE 5 MG PO TABS
5.0000 mg | ORAL_TABLET | ORAL | Status: DC
  Administered 2016-09-30: 5 mg via ORAL
  Filled 2016-09-29 (×3): qty 1

## 2016-09-29 MED ORDER — VANCOMYCIN HCL 10 G IV SOLR
1500.0000 mg | Freq: Once | INTRAVENOUS | Status: AC
  Administered 2016-09-29: 1500 mg via INTRAVENOUS
  Filled 2016-09-29: qty 1500

## 2016-09-29 MED ORDER — VANCOMYCIN HCL IN DEXTROSE 750-5 MG/150ML-% IV SOLN
750.0000 mg | INTRAVENOUS | Status: DC
  Administered 2016-09-30: 750 mg via INTRAVENOUS
  Filled 2016-09-29 (×2): qty 150

## 2016-09-29 MED ORDER — ACETAMINOPHEN 500 MG PO TABS: 500.0000 mg | ORAL_TABLET | Freq: Four times a day (QID) | ORAL | Status: DC | PRN

## 2016-09-29 MED ORDER — SODIUM CHLORIDE 0.9 % IV SOLN
INTRAVENOUS | Status: DC
  Administered 2016-09-29: 13:00:00 via INTRAVENOUS

## 2016-09-29 MED ORDER — FUROSEMIDE 10 MG/ML IJ SOLN
20.0000 mg | Freq: Every day | INTRAMUSCULAR | Status: DC
  Administered 2016-09-29 – 2016-09-30 (×2): 20 mg via INTRAVENOUS
  Filled 2016-09-29 (×2): qty 2

## 2016-09-29 MED ORDER — ACETAMINOPHEN 650 MG RE SUPP
650.0000 mg | Freq: Four times a day (QID) | RECTAL | Status: DC | PRN
Start: 2016-09-29 — End: 2016-10-01

## 2016-09-29 MED ORDER — LEVOTHYROXINE SODIUM 100 MCG PO TABS
100.0000 ug | ORAL_TABLET | Freq: Every day | ORAL | Status: DC
  Administered 2016-09-29 – 2016-10-01 (×3): 100 ug via ORAL
  Filled 2016-09-29 (×3): qty 1

## 2016-09-29 MED ORDER — NEBIVOLOL HCL 10 MG PO TABS
10.0000 mg | ORAL_TABLET | Freq: Every day | ORAL | Status: DC
  Administered 2016-09-29 – 2016-10-01 (×3): 10 mg via ORAL
  Filled 2016-09-29 (×4): qty 1

## 2016-09-29 MED ORDER — GABAPENTIN 100 MG PO CAPS
100.0000 mg | ORAL_CAPSULE | Freq: Two times a day (BID) | ORAL | Status: DC
  Administered 2016-09-29 – 2016-10-01 (×5): 100 mg via ORAL
  Filled 2016-09-29 (×5): qty 1

## 2016-09-29 MED ORDER — INSULIN ASPART 100 UNIT/ML ~~LOC~~ SOLN
0.0000 [IU] | Freq: Three times a day (TID) | SUBCUTANEOUS | Status: DC
  Administered 2016-09-29 – 2016-09-30 (×3): 2 [IU] via SUBCUTANEOUS

## 2016-09-29 MED ORDER — ACETAMINOPHEN 325 MG PO TABS: 650.0000 mg | ORAL_TABLET | Freq: Four times a day (QID) | ORAL | Status: DC | PRN

## 2016-09-29 MED ORDER — DEXTROSE 5 % IV SOLN
1.0000 g | INTRAVENOUS | Status: DC
  Administered 2016-09-29 – 2016-09-30 (×2): 1 g via INTRAVENOUS
  Filled 2016-09-29 (×3): qty 1

## 2016-09-29 MED ORDER — ONDANSETRON HCL 4 MG/2ML IJ SOLN
4.0000 mg | Freq: Four times a day (QID) | INTRAMUSCULAR | Status: DC | PRN
  Administered 2016-09-29: 4 mg via INTRAVENOUS
  Filled 2016-09-29: qty 2

## 2016-09-29 MED ORDER — ACETAMINOPHEN 325 MG PO TABS
650.0000 mg | ORAL_TABLET | Freq: Once | ORAL | Status: AC
  Administered 2016-09-29: 650 mg via ORAL
  Filled 2016-09-29: qty 2

## 2016-09-29 MED ORDER — SODIUM CHLORIDE 0.9 % IV SOLN
INTRAVENOUS | Status: DC
  Administered 2016-09-29: 09:00:00 via INTRAVENOUS

## 2016-09-29 MED ORDER — APIXABAN 2.5 MG PO TABS
2.5000 mg | ORAL_TABLET | Freq: Two times a day (BID) | ORAL | Status: DC
  Administered 2016-09-29 – 2016-10-01 (×5): 2.5 mg via ORAL
  Filled 2016-09-29 (×5): qty 1

## 2016-09-29 MED ORDER — HYDROCODONE-ACETAMINOPHEN 5-325 MG PO TABS: 1.0000 | ORAL_TABLET | ORAL | Status: DC | PRN

## 2016-09-29 MED ORDER — AMLODIPINE BESYLATE 5 MG PO TABS
5.0000 mg | ORAL_TABLET | Freq: Every day | ORAL | Status: DC
  Administered 2016-09-29 – 2016-10-01 (×3): 5 mg via ORAL
  Filled 2016-09-29 (×3): qty 1

## 2016-09-29 NOTE — ED Triage Notes (Signed)
Per EMS patient from home.  Experiences nausea, vomiting, weakness since Thursday of last week.  Unable to take home medications.

## 2016-09-29 NOTE — ED Notes (Signed)
Patient taken off NRB and placed on 3L via [place].  Oxygen saturation maintained at 96%

## 2016-09-29 NOTE — ED Notes (Signed)
Bed: WA20 Expected date:  Expected time:  Means of arrival:  Comments: EMS 83y/o emesis, fever

## 2016-09-29 NOTE — Progress Notes (Signed)
Pharmacy Antibiotic Note  [name] [name] is a 83 y.o. female admitted on 09/29/2016 with HCAP.  Pharmacy has been consulted for vancomycin and cefepime dosing. 6/25 CXR: PNA, temp 100.9, wt 75.9, WBC elevated at 18.8, creat 1.73. Creat cl ~ 28 ml/min. She was given zosyn 3.375 gm and vanc 1500 mg in the ED.   Plan: cefepime 1 gm IV q24 Vancomycin 750 mg IV every 24 hours.  Goal trough 15-20 mcg/mL. F/u renal fxn, wbc, temp, culture data, CXR vanc levels as needed Height: _0  (167.6 cm) Weight: 167 lb 5.3 oz (75.9 kg) IBW/kg (Calculated) : 59.3  Temp (24hrs), Avg:99.6 F (37.6 C), Min:98.3 F (36.8 C), Max:100.9 F (38.3 C)   Recent Labs Lab 09/29/16 0847 09/29/16 0853  WBC 18.8*  --   CREATININE 1.73*  --   LATICACIDVEN  --  1.86    Estimated Creatinine Clearance: 26.1 mL/min (A) (by C-G formula based on SCr of 1.73 mg/dL (H)).    Allergies  Allergen Reactions  . Codeine Sulfate Other (See Comments)    REACTION: GI upset  . Sulfonamide Derivatives Other (See Comments)    REACTION: GI upset  . Vancomycin Other (See Comments)    Red man syndrome  . Atarax [Hydroxyzine] Nausea Only and Rash    Antimicrobials this admission: Zosyn x 1 dose 6/25 Vanc 6/25>> Cefepime 6/25>> Dose adjustments this admission:  Microbiology results: 6/25 BCx2>> 6/25 HIV>>  Thank you for allowing pharmacy to be a part of this patient's care.  Eudelia Bunch, Pharm.D. [phone] 09/29/2016 11:27 AM

## 2016-09-29 NOTE — ED Notes (Signed)
ED Provider at bedside. 

## 2016-09-29 NOTE — Care Management Note (Signed)
Case Management Note  Patient Details  Name: [name] [name] MRN: [id number] Date of Birth: 31-Dec-1932  Subjective/Objective:            Patient presents to [facility] ED with c/o SOB and productive cough, fever and weakness x 1 week .  Patient was recently seen by her PCP but has not felt better. ED evaluation revealed pneumonia.  Patient has had 3 hospitalizations in the past 6 months. Patient may benefit from services with [facility].    Action/Plan: CM met with patient at bedside to discuss care transitional planning. Patient reports living at home with spouse. She states, she has had [place] services in the past with [facility] and would want to continue with the [facility] if services are recommended. Patient has r/w and 3n1 and  is on home oxygen at night which is also supplied by [facility]. Patient is aware that she will be brought into the hospital for treatment, and she is agreeable. CM explained that Unit CM will continue to follow up with care transition plan patient verbalized understanding teach back done.  Expected Discharge Date:   (unknown)               Expected Discharge Plan:  [place]  In-House Referral:     Discharge planning Services  CM Consult  Post Acute Care Choice:    Choice offered to:  Patient, Spouse  DME Arranged:    DME Agency:     HH Arranged:  RN, PT  [place] Agency:  [place]  Status of Service:  In process, will continue to follow  If discussed at Long Length of Stay Meetings, dates discussed:    Additional CommentsLaurena Slimmer, RN 09/29/2016, 12:37 PM

## 2016-09-29 NOTE — ED Provider Notes (Signed)
[place] DEPT Provider Note   CSN: [id number] Arrival date & time: 09/29/16  0808     History   Chief Complaint Chief Complaint  Patient presents with  . Weakness    HPI [name] [name] is a 83 y.o. female.  HPI   83 year old female with nausea and generalized fatigue. Symptom onset 5 days ago. Progressively worsening since then. She watches her PCP on Friday and was diagnosed with a viral illness and supportive treatment recommended. Unfortunately, she has continued to get worse. Productive cough. Mild shortness of breath. Past history includes Wegener's. She does use oxygen at night. Nausea. No appetite. No diarrhea. No urinary complaints.  Past Medical History:  Diagnosis Date  . Arthritis    r tkr  . Breast cancer ([place])   . Chronic atrial fibrillation (HCC)    CHADS2-VASc=6.  on low dose Eliquis (corrected for age & renal fxn)  . CVA (cerebral vascular accident) ([place]) 2008   mini stroke.no residual  . DEPRESSION 05/16/2009  . Diabetes mellitus type II   . Heart murmur    stress test 2009.dr peter [place]  . HYPERLIPIDEMIA 07/24/2008  . HYPERTENSION 07/24/2008  . HYPOTHYROIDISM 07/24/2008  . Intermittent vertigo   . Pneumonia   . Pulmonary hypertension ([place])    Mod PHTN on Echo 03/2016: 2/2 combination of Wegener's, HFPF & Afib.  . Skin abnormality    facial lesions .pt applying mupiracin to areas  . Vertigo   . Wegener's disease, pulmonary ([place]) 10/2012    Patient Active Problem List   Diagnosis Date Noted  . Malnutrition of moderate degree 07/01/2016  . Incarcerated hernia 06/30/2016  . Recurrent umbilical hernia with incarceration 06/30/2016  . CAP (community acquired pneumonia) 04/03/2016  . Cellulitis and abscess   . Diabetic foot ulcer ([place]) 12/10/2015  . Hereditary and idiopathic peripheral neuropathy 09/25/2014  . Dependent edema 09/25/2014  . UTI (lower urinary tract infection)   . Type 2 diabetes mellitus, controlled ([place]) 07/19/2014  . Chronic  atrial fibrillation (HCC) CHADS2-VASc=6.  On Corrected "low" dose of Eliquis 04/04/2014  . CKD (chronic kidney disease) stage 3, GFR 30-59 ml/min 04/04/2014  . Abdominal hernia without obstruction or gangrene 01/09/2014  . Nausea alone 08/21/2013  . Diastolic CHF, chronic (HCC) - on low dose Lasix. ARB & BB along with Norvasc 08/18/2013  . COPD (chronic obstructive pulmonary disease) ([place]) 08/18/2013  . Sepsis ([place]) 08/18/2013  . Hand pain, left 08/18/2013  . COPD with acute exacerbation ([place]) 05/10/2013  . Chronic kidney disease (CKD), stage IV (severe) ([place]) 05/08/2013  . Normocytic anemia 05/08/2013  . Pulmonary hypertension ([place]) 05/07/2013  . Right-sided heart failure ([place]) 05/07/2013  . Pancreatic cyst 04/17/2013  . Obesity (BMI 30-39.9) 12/28/2012  . Wegener's granulomatosis (granulomatosis with polyangiitis) ([place]) 11/02/2012  . Cough with hemoptysis 11/01/2012  . Anemia 11/01/2012  . Dehydration 11/01/2012  . Diffuse pulmonary alveolar hemorrhage 11/01/2012  . Breast cancer ([place]) 09/14/2012  . Pruritus of skin 05/20/2012  . Carcinoid tumor of colon, malignant ([place]) 05/16/2011  . Cryptogenic organizing pneumonia 04/05/2011  . Vertigo, intermittent 10/21/2010  . Type 2 diabetes mellitus ([place]) 06/04/2010  . Edema 03/25/2010  . Hyponatremia 02/13/2010  . DEPRESSION 05/16/2009  . Hypothyroidism 07/24/2008  . Hyperlipidemia 07/24/2008  . Essential hypertension 07/24/2008  . OSTEOARTHRITIS 07/24/2008    Past Surgical History:  Procedure Laterality Date  . BREAST SURGERY  2007   Lumpectomy, XRT 2006.l breast  . CHOLECYSTECTOMY  1980  . EXPLORATORY LAPAROTOMY    .  HEEL SPUR SURGERY Right   . JOINT REPLACEMENT     r knee  . KNEE SURGERY  2009   TKR  . TRANSTHORACIC ECHOCARDIOGRAM  03/2016   Afib (no Diastolic Fxn). EF 55-60%. No RWMA. Mild Aortic dilation Mild MR. Severe LA dilation. Mod RA dilation.  Mod TR with Mod-Severe Pulmonary HTN - but normal RV function..  .  VENTRAL HERNIA REPAIR N/A 07/01/2016   Procedure: REPAIR OF INCARCERATED INCISIONAL HERNIA ;  Surgeon: Stark Klein, MD;  Location: [facility];  Service: General;  Laterality: N/A;  . VIDEO ASSISTED THORACOSCOPY  04/22/2011   Procedure: VIDEO ASSISTED THORACOSCOPY;  Surgeon: Melrose Nakayama, MD;  Location: [place];  Service: Thoracic;  Laterality: Left;  WITH BIOPSY    OB History    No data available       Home Medications    Prior to Admission medications   Medication Sig Start Date End Date Taking? Authorizing Provider  amLODipine (NORVASC) 5 MG tablet TAKE ONE TABLET BY MOUTH ONCE DAILY. 05/14/16   Burchette, Alinda Sierras, MD  benzonatate (TESSALON PERLES) 100 MG capsule Take 1 capsule (100 mg total) by mouth 3 (three) times daily as needed. 09/26/16   Colin Benton R, DO  BYSTOLIC 10 MG tablet TAKE ONE TABLET BY MOUTH ONCE DAILY AFTER  BREAKFAST. Patient taking differently: TAKE 40m TABLET BY MOUTH ONCE DAILY AFTER  BREAKFAST. 11/26/15   Burchette, BAlinda Sierras MD  diazepam (VALIUM) 5 MG tablet Take one tablet by mouth as needed for severe vertigo flares Patient taking differently: Take 5 mg by mouth daily as needed (severe vertigo..Marland Kitchen.  11/15/15   Burchette, BAlinda Sierras MD  ELIQUIS 2.5 MG TABS tablet TAKE ONE TABLET BY MOUTH TWICE DAILY Patient taking differently: TAKE 2.582mTABLET BY MOUTH TWICE DAILY 02/25/16   JoMartiniquePeter M, MD  escitalopram (LEXAPRO) 10 MG tablet Take 1 tablet (10 mg total) by mouth daily. 11/30/15   Burchette, BrAlinda SierrasMD  furosemide (LASIX) 20 MG tablet Take 1 tablet (20 mg total) by mouth daily. 09/04/16   Burchette, BrAlinda SierrasMD  gabapentin (NEURONTIN) 100 MG capsule TAKE ONE CAPSULE BY MOUTH TWICE DAILY BEFORE  A  MEAL 08/20/16   Burchette, BrAlinda SierrasMD  glimepiride (AMARYL) 2 MG tablet Take 1 tablet (2 mg total) by mouth daily with breakfast. TAKE ONE TABLET BY MOUTH ONCE DAILY BEFORE BREAKFAST 01/29/16   Burchette, BrAlinda SierrasMD  levothyroxine (SYNTHROID, LEVOTHROID) 100 MCG tablet  Take 1 tablet (100 mcg total) by mouth daily before breakfast. 12/04/15   Burchette, BrAlinda SierrasMD  predniSONE (DELTASONE) 5 MG tablet TAKE 1 TABLET BY MOUTH ONCE DAILY WITH  BREAKFAST 08/20/16   Young, ClTarri Fuller, MD  triamcinolone cream (KENALOG) 0.1 % Apply 1 application topically 2 (two) times daily as needed. 05/15/15   Burchette, BrAlinda SierrasMD    Family History Family History  Problem Relation Age of Onset  . Arthritis Mother   . Rheum arthritis Mother   . Heart disease Father   . Coronary artery disease Father   . Cancer Sister        breast CA, both sisters  . Breast cancer Sister   . Coronary artery disease Brother   . Lung cancer Brother     Social History Social History  Substance Use Topics  . Smoking status: Former Smoker    Packs/day: 0.50    Years: 12.00    Types: Cigarettes    Quit date: 06/04/1982  .  Smokeless tobacco: Never Used  . Alcohol use No     Allergies   Codeine sulfate; Sulfonamide derivatives; Vancomycin; and Atarax [hydroxyzine]   Review of Systems Review of Systems  All systems reviewed and negative, other than as noted in HPI.  Physical Exam Updated Vital Signs BP 137/85   Pulse (!) 110   Temp (!) 100.9 F (38.3 C) (Oral)   Resp (!) 31   Ht _0  (1.676 m)   Wt 75.8 kg (167 lb)   SpO2 100%   BMI 26.95 kg/m   Physical Exam  Constitutional:  Laying in bed. Awake. Appears tired.   HENT:  Head: Normocephalic.  Small amount of blood R naris.   Eyes: Conjunctivae are normal. Right eye exhibits no discharge. Left eye exhibits no discharge.  Neck: Neck supple.  Cardiovascular: Normal heart sounds.  Exam reveals no gallop and no friction rub.   No murmur heard. Tachycardic. Irregularly irregular.   Pulmonary/Chest:  Tachypnea. Decreased breath sounds b/l bases.   Abdominal: Soft. She exhibits no distension. There is no tenderness.  Musculoskeletal: She exhibits no edema or tenderness.  Neurological: She is alert.  Skin: Skin is warm  and dry.  Psychiatric: She has a normal mood and affect. Her behavior is normal. Thought content normal.  Nursing note and vitals reviewed.    ED Treatments / Results  Labs (all labs ordered are listed, but only abnormal results are displayed) Labs Reviewed  COMPREHENSIVE METABOLIC PANEL - Abnormal; Notable for the following:       Result Value   Sodium 131 (*)    Chloride 100 (*)    CO2 20 (*)    Glucose, Bld 169 (*)    BUN 30 (*)    Creatinine, Ser 1.73 (*)    Calcium 8.2 (*)    Albumin 3.4 (*)    ALT 9 (*)    Total Bilirubin 1.8 (*)    GFR calc non Af Amer 26 (*)    GFR calc Af Amer 30 (*)    All other components within normal limits  CBC WITH DIFFERENTIAL/PLATELET - Abnormal; Notable for the following:    WBC 18.8 (*)    RBC 2.63 (*)    Hemoglobin 8.2 (*)    HCT 24.7 (*)    Neutro Abs 16.6 (*)    Monocytes Absolute 1.5 (*)    All other components within normal limits  PROTIME-INR - Abnormal; Notable for the following:    Prothrombin Time 24.3 (*)    All other components within normal limits  URINALYSIS, ROUTINE W REFLEX MICROSCOPIC - Abnormal; Notable for the following:    Hgb urine dipstick MODERATE (*)    Ketones, ur 5 (*)    Protein, ur 100 (*)    Leukocytes, UA LARGE (*)    Bacteria, UA RARE (*)    Squamous Epithelial / LPF 0-5 (*)    All other components within normal limits  PROTIME-INR - Abnormal; Notable for the following:    Prothrombin Time 24.1 (*)    All other components within normal limits  HEMOGLOBIN A1C - Abnormal; Notable for the following:    Hgb A1c MFr Bld 5.7 (*)    All other components within normal limits  GLUCOSE, CAPILLARY - Abnormal; Notable for the following:    Glucose-Capillary 158 (*)    All other components within normal limits  BASIC METABOLIC PANEL - Abnormal; Notable for the following:    Glucose, Bld 124 (*)    BUN  32 (*)    Creatinine, Ser 1.66 (*)    Calcium 8.4 (*)    GFR calc non Af Amer 28 (*)    GFR calc Af Amer  32 (*)    All other components within normal limits  CBC - Abnormal; Notable for the following:    WBC 16.1 (*)    RBC 2.57 (*)    Hemoglobin 7.8 (*)    HCT 23.9 (*)    All other components within normal limits  GLUCOSE, CAPILLARY - Abnormal; Notable for the following:    Glucose-Capillary 159 (*)    All other components within normal limits  GLUCOSE, CAPILLARY - Abnormal; Notable for the following:    Glucose-Capillary 161 (*)    All other components within normal limits  BRAIN NATRIURETIC PEPTIDE - Abnormal; Notable for the following:    B Natriuretic Peptide 1,294.9 (*)    All other components within normal limits  BASIC METABOLIC PANEL - Abnormal; Notable for the following:    Glucose, Bld 106 (*)    BUN 35 (*)    Creatinine, Ser 1.74 (*)    Calcium 8.6 (*)    GFR calc non Af Amer 26 (*)    GFR calc Af Amer 30 (*)    All other components within normal limits  CBC - Abnormal; Notable for the following:    RBC 2.61 (*)    Hemoglobin 8.0 (*)    HCT 24.3 (*)    All other components within normal limits  GLUCOSE, CAPILLARY - Abnormal; Notable for the following:    Glucose-Capillary 181 (*)    All other components within normal limits  GLUCOSE, CAPILLARY - Abnormal; Notable for the following:    Glucose-Capillary 125 (*)    All other components within normal limits  GLUCOSE, CAPILLARY - Abnormal; Notable for the following:    Glucose-Capillary 183 (*)    All other components within normal limits  GLUCOSE, CAPILLARY - Abnormal; Notable for the following:    Glucose-Capillary 193 (*)    All other components within normal limits  GLUCOSE, CAPILLARY - Abnormal; Notable for the following:    Glucose-Capillary 103 (*)    All other components within normal limits  GLUCOSE, CAPILLARY - Abnormal; Notable for the following:    Glucose-Capillary 154 (*)    All other components within normal limits  CULTURE, BLOOD (ROUTINE X 2)  CULTURE, BLOOD (ROUTINE X 2)  MRSA PCR SCREENING    RESPIRATORY PANEL BY PCR  HIV ANTIBODY (ROUTINE TESTING)  STREP PNEUMONIAE URINARY ANTIGEN  TSH  PROCALCITONIN  MAGNESIUM  I-STAT CG4 LACTIC ACID, ED    EKG  EKG Interpretation  Date/Time:  Monday September 29 2016 08:17:37 EDT Ventricular Rate:  106 PR Interval:    QRS Duration: 104 QT Interval:  359 QTC Calculation: 456 R Axis:   85 Text Interpretation:  Atrial fibrillation Ventricular premature complex Borderline right axis deviation Borderline repolarization abnormality Confirmed by Pryor Curia [phone]) on 09/30/2016 1:47:54 PM       Radiology Dg Chest 2 View  Result Date: 09/29/2016 CLINICAL DATA:  Worsening shortness of breath and weakness over the last 4 days. EXAM: CHEST  2 VIEW COMPARISON:  06/30/2016.  04/02/2016. FINDINGS: Chronic cardiomegaly. Chronic aortic atherosclerosis. Abnormal lung density probably representing bilateral pneumonia, worse in the left lower lobe. There could be an element of fluid overload/ edema as well. Postsurgical changes of left lung as noted previously. Small amount of pleural fluid probably present. IMPRESSION: Bilateral density most  severe in the left lower lobe most consistent with bronchopneumonia. There could also be an element of fluid overload/ edema. Electronically Signed   By: Nelson Chimes M.D.   On: 09/29/2016 09:18    Procedures Procedures (including critical care time)  Medications Ordered in ED Medications - No data to display   Initial Impression / Assessment and Plan / ED Course  I have reviewed the triage vital signs and the nursing notes.  Pertinent labs & imaging results that were available during my care of the patient were reviewed by me and considered in my medical decision making (see chart for details).     84yF with febrile illness. May be pulmonary process. Reports increased productive cough. Only PRN oxygen use, but requiring supplemental o2 to keep sats up 80%s currently. Tachypneic.   She is in afib but has  a hx of it. Rate around 100-110. Will likely improve with antipyretics.   CXR does show likely pneumonia. Clinically this fits. Admit a couple months ago. Vanc/zosyn. Allergy to vanc listed as red man's syndrome. Willl start at half usual rate.   Increasingly anemic. Epistaxis incidentally noted. May be from Wegener's. Also uses o2 via [place] at night. Regardless, it is currently pretty minimal. No overt bleeding otherwise. Anemia may be contributing to dyspnea. Needs to be trended.  She is on eliquis for afib.  With hypoxemia, will admit.   Final Clinical Impressions(s) / ED Diagnoses   Final diagnoses:  Community acquired pneumonia, unspecified laterality    New Prescriptions New Prescriptions   No medications on file     Virgel Manifold, MD 10/11/16 [phone]

## 2016-09-29 NOTE — ED Notes (Signed)
Oxygen saturation 79% on 3L via [place].  Placed on NRB. Oxygen saturation noted to be 100%

## 2016-09-29 NOTE — H&P (Signed)
History and Physical    [name] [name] ZOX:[id number] DOB: 27-Mar-1933 DOA: 09/29/2016  PCP: [name], MD  Patient coming from: Home  Chief Complaint: SOB and fever  HPI: [name] [name] is a 83 y.o. female with medical history significant of A. fib on Eliquis, Wegener's granulomatosis, she is on 2 L of oxygen at night recent history of incarcerated hernia required exploratory laparotomy (on 3/26). She came to the hospital complaining about SOB and fever. Patient reported about a week long history of cough, minimal sputum production, sore throat and runny nose. She was seen by her primary care physician on 6/22 given cough medicines, she did not feel better over the weekend and started to develop fevers so she came into the hospital for further evaluation. Initial evaluation in the ED with CXR showed pneumonia, started on cefepime/vancomycin to be treated as HS she was in the hospital at the end of March. She required nonrebreather mask briefly.  ED Course:  Vitals: Temp 100.9, heart rate 90 Labs: Sodium 131 glucose 169 creatinine 1.7 WBC 18.8 Imaging: Bilateral density most severe in the LLL consistent with bronchopneumonia Interventions: Started on cefepime and vancomycin  Review of Systems:  12 point review of systems negative except for the symptoms mentioned in the history of present illness.  Past Medical History:  Diagnosis Date  . Arthritis    r tkr  . Breast cancer ([place])   . Chronic atrial fibrillation (HCC)    CHADS2-VASc=6.  on low dose Eliquis (corrected for age & renal fxn)  . CVA (cerebral vascular accident) ([place]) 2008   mini stroke.no residual  . DEPRESSION 05/16/2009  . Diabetes mellitus type II   . Heart murmur    stress test 2009.dr peter [place]  . HYPERLIPIDEMIA 07/24/2008  . HYPERTENSION 07/24/2008  . HYPOTHYROIDISM 07/24/2008  . Intermittent vertigo   . Pneumonia   . Pulmonary hypertension ([place])    Mod PHTN on Echo 03/2016: 2/2 combination of Wegener's,  HFPF & Afib.  . Skin abnormality    facial lesions .pt applying mupiracin to areas  . Vertigo   . Wegener's disease, pulmonary ([place]) 10/2012    Past Surgical History:  Procedure Laterality Date  . BREAST SURGERY  2007   Lumpectomy, XRT 2006.l breast  . CHOLECYSTECTOMY  1980  . EXPLORATORY LAPAROTOMY    . HEEL SPUR SURGERY Right   . JOINT REPLACEMENT     r knee  . KNEE SURGERY  2009   TKR  . TRANSTHORACIC ECHOCARDIOGRAM  03/2016   Afib (no Diastolic Fxn). EF 55-60%. No RWMA. Mild Aortic dilation Mild MR. Severe LA dilation. Mod RA dilation.  Mod TR with Mod-Severe Pulmonary HTN - but normal RV function..  . VENTRAL HERNIA REPAIR N/A 07/01/2016   Procedure: REPAIR OF INCARCERATED INCISIONAL HERNIA ;  Surgeon: Stark Klein, MD;  Location: [facility];  Service: General;  Laterality: N/A;  . VIDEO ASSISTED THORACOSCOPY  04/22/2011   Procedure: VIDEO ASSISTED THORACOSCOPY;  Surgeon: Melrose Nakayama, MD;  Location: [facility];  Service: Thoracic;  Laterality: Left;  WITH BIOPSY     reports that she quit smoking about 34 years ago. Her smoking use included Cigarettes. She has a 6.00 pack-year smoking history. She has never used smokeless tobacco. She reports that she does not drink alcohol or use drugs.  Allergies  Allergen Reactions  . Codeine Sulfate Other (See Comments)    REACTION: GI upset  . Sulfonamide Derivatives Other (See Comments)    REACTION:  GI upset  . Vancomycin Other (See Comments)    Red man syndrome  . Atarax [Hydroxyzine] Nausea Only and Rash    Family History  Problem Relation Age of Onset  . Arthritis Mother   . Rheum arthritis Mother   . Heart disease Father   . Coronary artery disease Father   . Cancer Sister        breast CA, both sisters  . Breast cancer Sister   . Coronary artery disease Brother   . Lung cancer Brother     Prior to Admission medications   Medication Sig Start Date End Date Taking? Authorizing Provider  acetaminophen (TYLENOL) 500 MG  tablet Take 500 mg by mouth every 6 (six) hours as needed for headache.   Yes [provider]  amLODipine (NORVASC) 5 MG tablet TAKE ONE TABLET BY MOUTH ONCE DAILY. 05/14/16  Yes Burchette, Alinda Sierras, MD  benzonatate (TESSALON PERLES) 100 MG capsule Take 1 capsule (100 mg total) by mouth 3 (three) times daily as needed. 09/26/16  Yes Kim, Hannah R, DO  BYSTOLIC 10 MG tablet TAKE ONE TABLET BY MOUTH ONCE DAILY AFTER  BREAKFAST. Patient taking differently: TAKE 71m TABLET BY MOUTH ONCE DAILY AFTER  BREAKFAST. 11/26/15  Yes Burchette, BAlinda Sierras MD  diazepam (VALIUM) 5 MG tablet Take one tablet by mouth as needed for severe vertigo flares Patient taking differently: Take 5 mg by mouth daily as needed (severe vertigo..Marland Kitchen.  11/15/15  Yes Burchette, BAlinda Sierras MD  ELIQUIS 2.5 MG TABS tablet TAKE ONE TABLET BY MOUTH TWICE DAILY Patient taking differently: TAKE 2.511mTABLET BY MOUTH TWICE DAILY 02/25/16  Yes JoMartiniquePeter M, MD  escitalopram (LEXAPRO) 10 MG tablet Take 1 tablet (10 mg total) by mouth daily. 11/30/15  Yes Burchette, BrAlinda SierrasMD  furosemide (LASIX) 20 MG tablet Take 1 tablet (20 mg total) by mouth daily. 09/04/16  Yes Burchette, BrAlinda SierrasMD  gabapentin (NEURONTIN) 100 MG capsule TAKE ONE CAPSULE BY MOUTH TWICE DAILY BEFORE  A  MEAL 08/20/16  Yes Burchette, BrAlinda SierrasMD  glimepiride (AMARYL) 2 MG tablet Take 1 tablet (2 mg total) by mouth daily with breakfast. TAKE ONE TABLET BY MOUTH ONCE DAILY BEFORE BREAKFAST 01/29/16  Yes Burchette, BrAlinda SierrasMD  guaiFENesin (MUCINEX) 600 MG 12 hr tablet Take 600 mg by mouth 2 (two) times daily as needed for cough.   Yes [provider]  levothyroxine (SYNTHROID, LEVOTHROID) 100 MCG tablet Take 1 tablet (100 mcg total) by mouth daily before breakfast. 12/04/15  Yes Burchette, BrAlinda SierrasMD  predniSONE (DELTASONE) 5 MG tablet TAKE 1 TABLET BY MOUTH ONCE DAILY WITH  BREAKFAST Patient taking differently: TAKE 1 TABLET BY MOUTH EVERY OTHER DAY WITH BREAKFAST  08/20/16  Yes Young, Clinton D, MD  triamcinolone cream (KENALOG) 0.1 % Apply 1 application topically 2 (two) times daily as needed. Patient not taking: Reported on 09/29/2016 05/15/15   BuEulas PostMD    Physical Exam:  Vitals:   09/29/16 0900 09/29/16 0915 09/29/16 0930 09/29/16 1000  BP: (!) 147/77 (!) 144/80 128/73 127/73  Pulse: 100 93 76 78  Resp: (!) 37 (!) 37 (!) 29 (!) 33  Temp:      TempSrc:      SpO2: 93% 96% 95% 95%  Weight:      Height:        Constitutional: NAD, calm, comfortable Eyes: PERRL, lids and conjunctivae normal ENMT: Mucous membranes are moist. Posterior pharynx clear of any  exudate or lesions.Normal dentition.  Neck: normal, supple, no masses, no thyromegaly Respiratory: Scattered rhonchi especially on the left side Cardiovascular: Regular rate and rhythm, no murmurs / rubs / gallops. No extremity edema. 2+ pedal pulses. No carotid bruits.  Abdomen: no tenderness, no masses palpated. No hepatosplenomegaly. Bowel sounds positive.  Musculoskeletal: no clubbing / cyanosis. No joint deformity upper and lower extremities. Good ROM, no contractures. Normal muscle tone.  Skin: no rashes, lesions, ulcers. No induration Neurologic: CN 2-12 grossly intact. Sensation intact, DTR normal. Strength 5/5 in all 4.  Psychiatric: Normal judgment and insight. Alert and oriented x 3. Normal mood.   Labs on Admission: I have personally reviewed following labs and imaging studies  CBC:  Recent Labs Lab 09/29/16 0847  WBC 18.8*  NEUTROABS 16.6*  HGB 8.2*  HCT 24.7*  MCV 93.9  PLT 161   Basic Metabolic Panel:  Recent Labs Lab 09/29/16 0847  NA 131*  K 4.1  CL 100*  CO2 20*  GLUCOSE 169*  BUN 30*  CREATININE 1.73*  CALCIUM 8.2*   GFR: Estimated Creatinine Clearance: 26.1 mL/min (A) (by C-G formula based on SCr of 1.73 mg/dL (H)). Liver Function Tests:  Recent Labs Lab 09/29/16 0847  AST 20  ALT 9*  ALKPHOS 79  BILITOT 1.8*  PROT 7.7  ALBUMIN  3.4*   No results for input(s): LIPASE, AMYLASE in the last 168 hours. No results for input(s): AMMONIA in the last 168 hours. Coagulation Profile:  Recent Labs Lab 09/29/16 0847  INR 2.14   Cardiac Enzymes: No results for input(s): CKTOTAL, CKMB, CKMBINDEX, TROPONINI in the last 168 hours. BNP (last 3 results) No results for input(s): PROBNP in the last 8760 hours. HbA1C: No results for input(s): HGBA1C in the last 72 hours. CBG: No results for input(s): GLUCAP in the last 168 hours. Lipid Profile: No results for input(s): CHOL, HDL, LDLCALC, TRIG, CHOLHDL, LDLDIRECT in the last 72 hours. Thyroid Function Tests: No results for input(s): TSH, T4TOTAL, FREET4, T3FREE, THYROIDAB in the last 72 hours. Anemia Panel: No results for input(s): VITAMINB12, FOLATE, FERRITIN, TIBC, IRON, RETICCTPCT in the last 72 hours. Urine analysis:    Component Value Date/Time   COLORURINE YELLOW 09/29/2016 0930   APPEARANCEUR CLEAR 09/29/2016 0930   LABSPEC 1.014 09/29/2016 0930   PHURINE 6.0 09/29/2016 0930   GLUCOSEU NEGATIVE 09/29/2016 0930   HGBUR MODERATE (A) 09/29/2016 0930   BILIRUBINUR NEGATIVE 09/29/2016 0930   BILIRUBINUR n 12/30/2010 1700   KETONESUR 5 (A) 09/29/2016 0930   PROTEINUR 100 (A) 09/29/2016 0930   UROBILINOGEN 1.0 09/25/2014 1550   NITRITE NEGATIVE 09/29/2016 0930   LEUKOCYTESUR LARGE (A) 09/29/2016 0930   Sepsis Labs: !!!!!!!!!!!!!!!!!!!!!!!!!!!!!!!!!!!!!!!!!!!! Invalid input(s): PROCALCITONIN, LACTICIDVEN No results found for this or any previous visit (from the past 240 hour(s)).   Radiological Exams on Admission: Dg Chest 2 View  Result Date: 09/29/2016 CLINICAL DATA:  Worsening shortness of breath and weakness over the last 4 days. EXAM: CHEST  2 VIEW COMPARISON:  06/30/2016.  04/02/2016. FINDINGS: Chronic cardiomegaly. Chronic aortic atherosclerosis. Abnormal lung density probably representing bilateral pneumonia, worse in the left lower lobe. There could be an  element of fluid overload/ edema as well. Postsurgical changes of left lung as noted previously. Small amount of pleural fluid probably present. IMPRESSION: Bilateral density most severe in the left lower lobe most consistent with bronchopneumonia. There could also be an element of fluid overload/ edema. Electronically Signed   By: Nelson Chimes M.D.   On:  09/29/2016 09:18    EKG: Independently reviewed.   Assessment/Plan Principal Problem:   Acute on chronic respiratory failure with hypoxia (HCC) Active Problems:   Hypothyroidism   Wegener's granulomatosis (granulomatosis with polyangiitis) (HCC)   Diastolic CHF, chronic (HCC) - on low dose Lasix. ARB & BB along with Norvasc   COPD (chronic obstructive pulmonary disease) (HCC)   Chronic atrial fibrillation (HCC) CHADS2-VASc=6.  On Corrected "low" dose of Eliquis   CKD (chronic kidney disease) stage 3, GFR 30-59 ml/min    Acute on chronic respiratory failure with hypoxia -Patient is on 2 L of oxygen at night secondary to after she had overnight oximetry confirmed 2 saturation. -She had significant desaturations required nonrebreather mask briefly. -This is likely secondary to pneumonia, started on antibiotics reviewed  Pneumonia -Fever, CXR findings and cough. Treated as HCAP, started on cefepime and vancomycin. -She had chronic infiltrates on the x-ray based on Dr. Janee Morn note. -Supportive management with bronchodilators, mucolytics, antitussives and oxygen as needed.  CKD stage III -Creatinine baseline is 1.1 from March 2018, presented with creatinine of 1.73 -I am very reluctant to give her fluids as she probably has very limited respiratory capacity. -She is on 20 mg of Lasix orally daily switched to IV. Follow renal function closely.  Chronic atrial fibrillation -CHA2DS2-VASc is 6, she is on corrected dose of Eliquis. -Rate is controlled with bisoprolol was discontinued.  Wegener's granulomatosis -She is on 5 mg of  prednisone every other day, this is continued. -She is not wheezing or having low blood pressure to increase the dose of steroids.  Hypothyroidism -Continue Synthroid, check TSH.  Diabetes mellitus type 2 -Carbohydrate modified diet, hold oral hypoglycemic agents. -Check hemoglobin A1c and start SSI.   DVT prophylaxis: On Eliquis Code Status: Full code Family Communication: Plan D/W patient in the presence of husband and son at bedside. Disposition Plan: Home Consults called:  Admission status: Inpatient, telemetry   [facility] A MD [facility] Pager [phone]  If 7PM-7AM, please contact night-coverage [email or web address] Password [facility]  09/29/2016, 10:29 AM

## 2016-09-30 ENCOUNTER — Encounter (HOSPITAL_COMMUNITY): Payer: Self-pay

## 2016-09-30 DIAGNOSIS — N183 Chronic kidney disease, stage 3 (moderate): Secondary | ICD-10-CM

## 2016-09-30 DIAGNOSIS — J9621 Acute and chronic respiratory failure with hypoxia: Secondary | ICD-10-CM

## 2016-09-30 DIAGNOSIS — I482 Chronic atrial fibrillation: Secondary | ICD-10-CM

## 2016-09-30 DIAGNOSIS — J181 Lobar pneumonia, unspecified organism: Secondary | ICD-10-CM

## 2016-09-30 LAB — GLUCOSE, CAPILLARY
GLUCOSE-CAPILLARY: 161 mg/dL — AB (ref 65–99)
GLUCOSE-CAPILLARY: 183 mg/dL — AB (ref 65–99)
GLUCOSE-CAPILLARY: 193 mg/dL — AB (ref 65–99)
Glucose-Capillary: 125 mg/dL — ABNORMAL HIGH (ref 65–99)
Glucose-Capillary: 181 mg/dL — ABNORMAL HIGH (ref 65–99)

## 2016-09-30 LAB — HIV ANTIBODY (ROUTINE TESTING W REFLEX): HIV Screen 4th Generation wRfx: NONREACTIVE

## 2016-09-30 LAB — CBC
HEMATOCRIT: 23.9 % — AB (ref 36.0–46.0)
HEMOGLOBIN: 7.8 g/dL — AB (ref 12.0–15.0)
MCH: 30.4 pg (ref 26.0–34.0)
MCHC: 32.6 g/dL (ref 30.0–36.0)
MCV: 93 fL (ref 78.0–100.0)
Platelets: 252 10*3/uL (ref 150–400)
RBC: 2.57 MIL/uL — AB (ref 3.87–5.11)
RDW: 13.7 % (ref 11.5–15.5)
WBC: 16.1 10*3/uL — AB (ref 4.0–10.5)

## 2016-09-30 LAB — BASIC METABOLIC PANEL
ANION GAP: 8 (ref 5–15)
BUN: 32 mg/dL — ABNORMAL HIGH (ref 6–20)
CALCIUM: 8.4 mg/dL — AB (ref 8.9–10.3)
CO2: 22 mmol/L (ref 22–32)
Chloride: 105 mmol/L (ref 101–111)
Creatinine, Ser: 1.66 mg/dL — ABNORMAL HIGH (ref 0.44–1.00)
GFR, EST AFRICAN AMERICAN: 32 mL/min — AB (ref >60)
GFR, EST NON AFRICAN AMERICAN: 28 mL/min — AB (ref >60)
GLUCOSE: 124 mg/dL — AB (ref 65–99)
POTASSIUM: 4 mmol/L (ref 3.5–5.1)
SODIUM: 135 mmol/L (ref 135–145)

## 2016-09-30 LAB — HEMOGLOBIN A1C
HEMOGLOBIN A1C: 5.7 % — AB (ref 4.8–5.6)
MEAN PLASMA GLUCOSE: 117 mg/dL

## 2016-09-30 LAB — MRSA PCR SCREENING: MRSA BY PCR: NEGATIVE

## 2016-09-30 LAB — STREP PNEUMONIAE URINARY ANTIGEN: Strep Pneumo Urinary Antigen: NEGATIVE

## 2016-09-30 LAB — BRAIN NATRIURETIC PEPTIDE: B NATRIURETIC PEPTIDE 5: 1294.9 pg/mL — AB (ref 0.0–100.0)

## 2016-09-30 LAB — PROCALCITONIN: Procalcitonin: 5 ng/mL

## 2016-09-30 MED ORDER — FUROSEMIDE 10 MG/ML IJ SOLN
20.0000 mg | Freq: Once | INTRAMUSCULAR | Status: AC
  Administered 2016-09-30: 20 mg via INTRAVENOUS
  Filled 2016-09-30: qty 2

## 2016-09-30 MED ORDER — FUROSEMIDE 10 MG/ML IJ SOLN
40.0000 mg | Freq: Every day | INTRAMUSCULAR | Status: DC
  Administered 2016-10-01: 40 mg via INTRAVENOUS
  Filled 2016-09-30: qty 4

## 2016-09-30 MED ORDER — DM-GUAIFENESIN ER 30-600 MG PO TB12
1.0000 | ORAL_TABLET | Freq: Two times a day (BID) | ORAL | Status: DC
  Administered 2016-09-30 – 2016-10-01 (×3): 1 via ORAL
  Filled 2016-09-30 (×2): qty 1

## 2016-09-30 MED ORDER — IPRATROPIUM-ALBUTEROL 0.5-2.5 (3) MG/3ML IN SOLN: 3.0000 mL | Freq: Four times a day (QID) | RESPIRATORY_TRACT | Status: DC | PRN

## 2016-09-30 MED ORDER — IPRATROPIUM-ALBUTEROL 0.5-2.5 (3) MG/3ML IN SOLN
3.0000 mL | Freq: Four times a day (QID) | RESPIRATORY_TRACT | Status: DC
  Filled 2016-09-30: qty 3

## 2016-09-30 NOTE — Care Management Note (Signed)
Case Management Note  Patient Details  Name: HALEN MOSSBARGER MRN: [id number] Date of Birth: 11-25-1932  Subjective/Objective: 83 y/o f admitted w/Acute on chronic resp failure. From home w/spouse. Uses home 02 @ HS, may need it more often-will monitor. Used [facility] in past-Recc HHRN-meds,disease mgmnt. PT/OT cons-await recc.  Patient declines [facility].                 Action/Plan:d/c plan home w/HHC.   Expected Discharge Date:   (unknown)               Expected Discharge Plan:  [place]  In-House Referral:     Discharge planning Services  CM Consult  Post Acute Care Choice:  [facility] (Used [facility] in past) Choice offered to:  Patient, Spouse  DME Arranged:    DME Agency:     HH Arranged:  RN, PT [place] Agency:  [place]  Status of Service:  In process, will continue to follow  If discussed at Long Length of Stay Meetings, dates discussed:    Additional Comments:  Dessa Phi, RN 09/30/2016, 12:43 PM

## 2016-09-30 NOTE — Consult Note (Signed)
[facility] CM Inpatient Consult   09/30/2016  [name] [name] Nov 09, 1932 [id number]    [facility] Care Management referral received. Discussed referral with inpatient RNCM prior going to patient's bedside.   Spoke with [name] [name]. Explained and offered [facility] Management program. She pleasantly declines [facility] by stating " I have a home health nurse that goes over my medications and disease management". Also reports her Primary Care MD office, Dr. Elease Hashimoto, follows her closely. Denies any current [facility] Care Management needs at this time.   Provided [facility] Care Management brochure with contact information to call should she change her mind in the future.   Made inpatient RNCM aware.    [name], MSN-Ed, RN,BSN [facility] Liaison [phone]

## 2016-09-30 NOTE — Progress Notes (Signed)
PROGRESS NOTE [address]   [name] [name]   WUJ:[id number] DOB: 05/17/1932  DOA: 09/29/2016 PCP: Kristian Covey, MD   Brief Narrative:  83 y.o. Female with significant hx of A.Fib on eliquis, Wegeners granulomatosis, on 2L O2 at home at night presented to the ED with SOB, cough, nausea, and fever. Has had Sob and caugh for about 1 week. Saw PCP on 6/22 and was treated with cough medication, when she developed fever she came to ED. ED CXR showed pneumonia and she was started on cefepime and vanco. Treated as hospital acquired as she was in the hospital at the end of march. She breafily required nonrebreather mask. Currently on 3L O2 via [place]. Patient is feeling better but is still nauseous.    Assessment & Plan:   Principal Problem:   Acute on chronic respiratory failure with hypoxia (HCC) Active Problems:   Hypothyroidism   Wegener's granulomatosis (granulomatosis with polyangiitis) (HCC)   Diastolic CHF, chronic (HCC) - on low dose Lasix. ARB & BB along with Norvasc   COPD (chronic obstructive pulmonary disease) (HCC)   Chronic atrial fibrillation (HCC) CHADS2-VASc=6.  On Corrected "low" dose of Eliquis   CKD (chronic kidney disease) stage 3, GFR 30-59 ml/min   Acute on chronic respiratory failure with hypoxia  -Secondary to pneumonia -started on antibiotics -Patient on 3L O2 via [place] -BNP 1,294.9  Pneumonia -HCAP due to recent hospital stay -Continue cefepime and vanco -MRSA PCR pending -Viral panel pending -3L O2 -Supportive care: bronchodilators, mucolytics, antitussives  CKD stage III -creatinine baseline is 1.1, creatinine is 1.66 today -switched to IV lasix on admission, monitor renal closely  Chronic A. Fib -CHADS2-VASc is 6 -Continue eliquis  Wegeners granulomatosis -continue 5mg  prednisone every other day  Hypothyroidism -Continue syntrhoid -TSH normal  Type 2 Diabetes Mellitus -Carbohydrate modified diet, hold oral hypoglycemic  agents -continue SSI -A1c 5.7 improved from 6.0 3 months ago    DVT prophylaxis: on eliquis Code Status: Full Family Communication: None at bedside Disposition Plan: 24-48 hrs pending response to antibiotics.   Consultants:   none  Procedures:  none  Antimicrobials:  Cefepime  vancomycin  Subjective: Patient seen and examined. Patient states she is feeling better since yesterday, but she is still having some Sob, cough, and nausea. Patient denies chest pain, vomiting, or headaches. She states she hasnt been able to eat in 5 days due to nausea  Objective: Vitals:   09/29/16 1117 09/29/16 1323 09/29/16 2023 09/30/16 0416  BP: 129/73 114/75 137/75 131/67  Pulse: 83 84 84 78  Resp: (!) 26 (!) 26 20 20   Temp: 98.3 F (36.8 C) 97.6 F (36.4 C) 98.9 F (37.2 C) 98.3 F (36.8 C)  TempSrc: Oral Oral Oral Oral  SpO2: 97% 97% 97% 98%  Weight: 167 lb 5.3 oz (75.9 kg)     Height: 5\' 6"  (1.676 m)       Intake/Output Summary (Last 24 hours) at 09/30/16 1038 Last data filed at 09/30/16 1014  Gross per 24 hour  Intake           221.33 ml  Output              900 ml  Net          -678.67 ml   Filed Weights   09/29/16 0818 09/29/16 1117  Weight: 167 lb (75.8 kg) 167 lb 5.3 oz (75.9 kg)    Examination:  General exam: Appears fatigued and deconditioned, in no acute distress Respiratory  system: scattered rhonchi mainly in left lower lobe. Cardiovascular system: S1 & S2 heard. No JVD, rubs or gallops. Currently in A. Fib Gastrointestinal system: Abdomen is nondistended, soft and nontender. No organomegaly or masses felt. Normal bowel sounds heard. Central nervous system: Alert and oriented. No focal neurological deficits. Extremities: No pedal edema. Skin: No rashes, lesions or ulcers Psychiatry: Judgement and insight appear normal. Mood & affect appropriate.    Data Reviewed: I have personally reviewed following labs and imaging studies  CBC:  Recent Labs Lab  09/29/16 0847 09/30/16 0546  WBC 18.8* 16.1*  NEUTROABS 16.6*  --   HGB 8.2* 7.8*  HCT 24.7* 23.9*  MCV 93.9 93.0  PLT 247 252   Basic Metabolic Panel:  Recent Labs Lab 09/29/16 0847 09/30/16 0546  NA 131* 135  K 4.1 4.0  CL 100* 105  CO2 20* 22  GLUCOSE 169* 124*  BUN 30* 32*  CREATININE 1.73* 1.66*  CALCIUM 8.2* 8.4*   GFR: Estimated Creatinine Clearance: 27.2 mL/min (A) (by C-G formula based on SCr of 1.66 mg/dL (H)). Liver Function Tests:  Recent Labs Lab 09/29/16 0847  AST 20  ALT 9*  ALKPHOS 79  BILITOT 1.8*  PROT 7.7  ALBUMIN 3.4*   No results for input(s): LIPASE, AMYLASE in the last 168 hours. No results for input(s): AMMONIA in the last 168 hours. Coagulation Profile:  Recent Labs Lab 09/29/16 0847 09/29/16 1330  INR 2.14 2.12   Cardiac Enzymes: No results for input(s): CKTOTAL, CKMB, CKMBINDEX, TROPONINI in the last 168 hours. BNP (last 3 results) No results for input(s): PROBNP in the last 8760 hours. HbA1C:  Recent Labs  09/29/16 1330  HGBA1C 5.7*   CBG:  Recent Labs Lab 09/29/16 1204 09/29/16 1721 09/29/16 2036  GLUCAP 158* 159* 161*   Lipid Profile: No results for input(s): CHOL, HDL, LDLCALC, TRIG, CHOLHDL, LDLDIRECT in the last 72 hours. Thyroid Function Tests:  Recent Labs  09/29/16 1330  TSH 3.286   Anemia Panel: No results for input(s): VITAMINB12, FOLATE, FERRITIN, TIBC, IRON, RETICCTPCT in the last 72 hours. Sepsis Labs:  Recent Labs Lab 09/29/16 0853  LATICACIDVEN 1.86    No results found for this or any previous visit (from the past 240 hour(s)).       Radiology Studies: Dg Chest 2 View  Result Date: 09/29/2016 CLINICAL DATA:  Worsening shortness of breath and weakness over the last 4 days. EXAM: CHEST  2 VIEW COMPARISON:  06/30/2016.  04/02/2016. FINDINGS: Chronic cardiomegaly. Chronic aortic atherosclerosis. Abnormal lung density probably representing bilateral pneumonia, worse in the left lower  lobe. There could be an element of fluid overload/ edema as well. Postsurgical changes of left lung as noted previously. Small amount of pleural fluid probably present. IMPRESSION: Bilateral density most severe in the left lower lobe most consistent with bronchopneumonia. There could also be an element of fluid overload/ edema. Electronically Signed   By: Paulina Fusi M.D.   On: 09/29/2016 09:18      Scheduled Meds: . amLODipine  5 mg Oral Daily  . apixaban  2.5 mg Oral BID  . escitalopram  10 mg Oral Daily  . furosemide  20 mg Intravenous Daily  . gabapentin  100 mg Oral BID  . insulin aspart  0-9 Units Subcutaneous TID WC  . levothyroxine  100 mcg Oral QAC breakfast  . nebivolol  10 mg Oral Daily  . predniSONE  5 mg Oral Q48H  . sodium chloride flush  3 mL Intravenous  Q12H   Continuous Infusions: . sodium chloride 10 mL/hr at 09/29/16 1700  . ceFEPime (MAXIPIME) IV 1 g (09/29/16 1740)  . vancomycin 750 mg (09/30/16 0944)     LOS: 1 day     Dajai Wahlert, PA-s  If 7PM-7AM, please contact night-coverage [email or web address] Password [facility] 09/30/2016, 10:38 AM

## 2016-10-01 DIAGNOSIS — E1121 Type 2 diabetes mellitus with diabetic nephropathy: Secondary | ICD-10-CM

## 2016-10-01 LAB — RESPIRATORY PANEL BY PCR
Adenovirus: NOT DETECTED
BORDETELLA PERTUSSIS-RVPCR: NOT DETECTED
CORONAVIRUS HKU1-RVPPCR: NOT DETECTED
Chlamydophila pneumoniae: NOT DETECTED
Coronavirus 229E: NOT DETECTED
Coronavirus NL63: NOT DETECTED
Coronavirus OC43: NOT DETECTED
Influenza A: NOT DETECTED
Influenza B: NOT DETECTED
METAPNEUMOVIRUS-RVPPCR: NOT DETECTED
Mycoplasma pneumoniae: NOT DETECTED
PARAINFLUENZA VIRUS 3-RVPPCR: NOT DETECTED
PARAINFLUENZA VIRUS 4-RVPPCR: NOT DETECTED
Parainfluenza Virus 1: NOT DETECTED
Parainfluenza Virus 2: NOT DETECTED
RHINOVIRUS / ENTEROVIRUS - RVPPCR: NOT DETECTED
Respiratory Syncytial Virus: NOT DETECTED

## 2016-10-01 LAB — GLUCOSE, CAPILLARY
Glucose-Capillary: 103 mg/dL — ABNORMAL HIGH (ref 65–99)
Glucose-Capillary: 154 mg/dL — ABNORMAL HIGH (ref 65–99)

## 2016-10-01 LAB — BASIC METABOLIC PANEL
Anion gap: 7 (ref 5–15)
BUN: 35 mg/dL — AB (ref 6–20)
CALCIUM: 8.6 mg/dL — AB (ref 8.9–10.3)
CHLORIDE: 102 mmol/L (ref 101–111)
CO2: 27 mmol/L (ref 22–32)
CREATININE: 1.74 mg/dL — AB (ref 0.44–1.00)
GFR calc Af Amer: 30 mL/min — ABNORMAL LOW (ref >60)
GFR calc non Af Amer: 26 mL/min — ABNORMAL LOW (ref >60)
Glucose, Bld: 106 mg/dL — ABNORMAL HIGH (ref 65–99)
Potassium: 3.7 mmol/L (ref 3.5–5.1)
SODIUM: 136 mmol/L (ref 135–145)

## 2016-10-01 LAB — MAGNESIUM: Magnesium: 2.2 mg/dL (ref 1.7–2.4)

## 2016-10-01 LAB — CBC
HCT: 24.3 % — ABNORMAL LOW (ref 36.0–46.0)
Hemoglobin: 8 g/dL — ABNORMAL LOW (ref 12.0–15.0)
MCH: 30.7 pg (ref 26.0–34.0)
MCHC: 32.9 g/dL (ref 30.0–36.0)
MCV: 93.1 fL (ref 78.0–100.0)
PLATELETS: 285 10*3/uL (ref 150–400)
RBC: 2.61 MIL/uL — ABNORMAL LOW (ref 3.87–5.11)
RDW: 13.4 % (ref 11.5–15.5)
WBC: 9.2 10*3/uL (ref 4.0–10.5)

## 2016-10-01 MED ORDER — AMOXICILLIN-POT CLAVULANATE 500-125 MG PO TABS: 1.0000 | ORAL_TABLET | Freq: Two times a day (BID) | ORAL | 0 refills | Status: AC

## 2016-10-01 NOTE — Progress Notes (Signed)
PT Cancellation Note  Patient Details Name: [name] [name] MRN: [id number] DOB: 1933/01/30   Cancelled Treatment:    Reason Eval/Treat Not Completed: Other (comment) Pt eating breakfast.  Will check back later this AM to complete PT eval.   Galen Manila 10/01/2016, 9:01 AM

## 2016-10-01 NOTE — Evaluation (Signed)
Physical Therapy Evaluation & discharge Patient Details Name: [name] [name] MRN: [id number] DOB: 01-05-1933 Today's Date: 10/01/2016   History of Present Illness  83 y/o admitted with PNA with PMH of hernia repair 07/01/16, COPD, vertigo, DM, CVA, HTN, A fib, cellulitis  Clinical Impression  Pt ambulating near baseline level, but fatigues quickly and would benefit form HHPT. Her o2 was 93% on 2L/min via nasal canula.  Pt scheduled to d/c today from hospital.  Will sign off for acute care, but if d/c is cancelled, please re-order.  Recommend HHPT and no equipment needed.     Follow Up Recommendations Home health PT    Equipment Recommendations  None recommended by PT    Recommendations for Other Services       Precautions / Restrictions Precautions Precautions: Fall Restrictions Weight Bearing Restrictions: No      Mobility  Bed Mobility Overal bed mobility: Independent                Transfers Overall transfer level: Modified independent Equipment used: Rolling walker (2 wheeled)             General transfer comment: MOD I with increased time  Ambulation/Gait Ambulation/Gait assistance: Min guard Ambulation Distance (Feet): 93 Feet Assistive device: Rolling walker (2 wheeled) Gait Pattern/deviations: Decreased step length - left;Decreased step length - right     General Gait Details: Pt with decreased step length and hugs wall due to hx of vertigo and pt's fear of falling.  1 standing rest break with o2 93% on 2 L/min  Stairs            Wheelchair Mobility    Modified Rankin (Stroke Patients Only)       Balance Overall balance assessment: Needs assistance         Standing balance support: During functional activity Standing balance-Leahy Scale: Fair Standing balance comment: Pt able to stand briefly at sink without UE support to attempt to tie gown, she preferred to have at least 1 UE supported                              Pertinent Vitals/Pain Pain Assessment: No/denies pain    Home Living Family/patient expects to be discharged to:: Private residence Living Arrangements: Spouse/significant other;Children (husband has Alzheimer's) Available Help at Discharge: Family Type of Home: House Home Access: Stairs to enter     Home Layout: One level Home Equipment: Environmental consultant - 2 wheels;Walker - 4 wheels;Wheelchair - manual;Grab bars - toilet;Grab bars - tub/shower;Bedside commode      Prior Function Level of Independence: Independent with assistive device(s)         Comments: RW for ambulation     Hand Dominance        Extremity/Trunk Assessment   Upper Extremity Assessment Upper Extremity Assessment: Overall WFL for tasks assessed    Lower Extremity Assessment Lower Extremity Assessment: Overall WFL for tasks assessed       Communication   Communication: No difficulties  Cognition Arousal/Alertness: Awake/alert Behavior During Therapy: WFL for tasks assessed/performed Overall Cognitive Status: Within Functional Limits for tasks assessed                                        General Comments      Exercises     Assessment/Plan    PT Assessment All  further PT needs can be met in the next venue of care  PT Problem List Decreased activity tolerance;Decreased balance;Decreased mobility       PT Treatment Interventions      PT Goals (Current goals can be found in the Care Plan section)  Acute Rehab PT Goals Patient Stated Goal: go home PT Goal Formulation: All assessment and education complete, DC therapy    Frequency     Barriers to discharge        Co-evaluation               AM-PAC PT "6 Clicks" Daily Activity  Outcome Measure Difficulty turning over in bed (including adjusting bedclothes, sheets and blankets)?: None Difficulty moving from lying on back to sitting on the side of the bed? : A Little Difficulty sitting down on and standing up  from a chair with arms (e.g., wheelchair, bedside commode, etc,.)?: A Little Help needed moving to and from a bed to chair (including a wheelchair)?: A Little Help needed walking in hospital room?: A Little Help needed climbing 3-5 steps with a railing? : A Little 6 Click Score: 19    End of Session Equipment Utilized During Treatment: Gait belt Activity Tolerance: Patient tolerated treatment well;Patient limited by fatigue Patient left: in chair;with call bell/phone within reach Nurse Communication: Mobility status PT Visit Diagnosis: Difficulty in walking, not elsewhere classified (R26.2)    Time: 3664-4034 PT Time Calculation (min) (ACUTE ONLY): 28 min   Charges:   PT Evaluation $PT Eval Low Complexity: 1 Procedure PT Treatments $Gait Training: 8-22 mins   PT G Codes:        [name] [name], [place] Pager [phone] 10/01/2016   [name] [name] 10/01/2016, 10:29 AM

## 2016-10-01 NOTE — Discharge Summary (Signed)
Physician Discharge Summary  [name] [name] JQB:[id number] DOB: Nov 18, 1932 DOA: 09/29/2016  PCP: [name], MD  Admit date: 09/29/2016 Discharge date: 10/01/2016  Admitted From:home Disposition:home with home care services  Recommendations for Outpatient Follow-up:  1. Follow up with PCP in 1-2 weeks 2. Please obtain BMP/CBC in One week.   Home Health:yes Equipment/Devices:resume nocturnal oxygen  Discharge Condition:stable CODE STATUS:full code Diet recommendation:carb modified heart healthy diet  Brief/Interim Summary: 83 year old female with history of persistent atrial fibrillation on liquids, Wegener's granulomatosis on chronic prednisone, chronic hypoxic respiratory failure on 2 L of oxygen at night, incarcerated hernia required exploratory laparotomy (on 3/26), seen by PCP few days ago for shortness of breath and fever thought to be viral presented with worsening shortness of breath, cough and fever to the ER On admission, patient was found to have possible pneumonia. Treated with antibiotics and admitted for further evaluation. The patient required nonrebreather briefly in ER.Chest x-ray consistent with left lower lobe pneumonia.  # Left lower lobe pneumonia: Patient with multiple hospital visits recently. The last one was more than a month before. On admission treated as healthcare associated pneumonia. MRSA PCR negative. Continued on cefepime. Percussed and mildly elevated. Patient clinically improved. She was in room air today. Able to ambulate in room air with acceptable oxygen saturation. Cultures are negative so far. Plan to discharge with 5 more days of Augmentin and recommended to continue nighttime oxygen that she takes at home.  # Acute on chronic hypoxic respiratory failure likely in the setting of pneumonia : -Patient uses about 2 L of oxygen at night. Clinically improved. On room air today and ambulate with with acceptable oxygen saturation. She received IV Lasix  because of possible pulmonary congestion.  Echo from December 2017 consistent with EF of 55-60%. Recommended to continue oral Lasix and low side diet on discharge.  # Chronic kidney disease is stage III: On reviewing patient's medical record patient has fluctuation in serum creatinine level. Mostly creatinine around 1.5. She is at baseline currently. Continue to monitor BMP. Avoid nephrotoxins.  # Chronic atrial fibrillation: Continue eliquis. Rate is controlled.continue bystolic.  # Wegener's granulomatosis: On chronic prednisone. Continue.  # Hypothyroidism: Continue Synthroid  # Type 2 diabetes: Continue insulin sliding scale. Patient takes oral hypoglycemic agent at home.  Patient is clinically stable and improved significantly. Denied headache, dizziness, chest pain, shortness of breath or coughing. Feels much better. Discharge with oral antibiotics. Recommended to follow-up with PCP in a week. She verbalized understanding.  Discharge Diagnoses:  Principal Problem:   Acute on chronic respiratory failure with hypoxia (HCC) Active Problems:   Hypothyroidism   Wegener's granulomatosis (granulomatosis with polyangiitis) (HCC)   Diastolic CHF, chronic (HCC) - on low dose Lasix. ARB & BB along with Norvasc   COPD (chronic obstructive pulmonary disease) (HCC)   Chronic atrial fibrillation (HCC) CHADS2-VASc=6.  On Corrected "low" dose of Eliquis   CKD (chronic kidney disease) stage 3, GFR 30-59 ml/min    Discharge Instructions  Discharge Instructions    Call MD for:  difficulty breathing, headache or visual disturbances    Complete by:  As directed    Call MD for:  extreme fatigue    Complete by:  As directed    Call MD for:  hives    Complete by:  As directed    Call MD for:  persistant dizziness or light-headedness    Complete by:  As directed    Call MD for:  persistant nausea and vomiting  Complete by:  As directed    Call MD for:  severe uncontrolled pain    Complete  by:  As directed    Call MD for:  temperature >100.4    Complete by:  As directed    Diet - low sodium heart healthy    Complete by:  As directed    Diet Carb Modified    Complete by:  As directed    Discharge instructions    Complete by:  As directed    Resume oxygen at night   Increase activity slowly    Complete by:  As directed      Allergies as of 10/01/2016      Reactions   Codeine Sulfate Other (See Comments)   REACTION: GI upset   Sulfonamide Derivatives Other (See Comments)   REACTION: GI upset   Vancomycin Other (See Comments)   Red man syndrome   Atarax [hydroxyzine] Nausea Only, Rash      Medication List    TAKE these medications   acetaminophen 500 MG tablet Commonly known as:  TYLENOL Take 500 mg by mouth every 6 (six) hours as needed for headache.   amLODipine 5 MG tablet Commonly known as:  NORVASC TAKE ONE TABLET BY MOUTH ONCE DAILY.   amoxicillin-clavulanate 500-125 MG tablet Commonly known as:  AUGMENTIN Take 1 tablet (500 mg total) by mouth 2 (two) times daily.   benzonatate 100 MG capsule Commonly known as:  TESSALON PERLES Take 1 capsule (100 mg total) by mouth 3 (three) times daily as needed.   BYSTOLIC 10 MG tablet Generic drug:  nebivolol TAKE ONE TABLET BY MOUTH ONCE DAILY AFTER  BREAKFAST. What changed:  See the new instructions.   diazepam 5 MG tablet Commonly known as:  VALIUM Take one tablet by mouth as needed for severe vertigo flares What changed:  how much to take  how to take this  when to take this  reasons to take this  additional instructions   ELIQUIS 2.5 MG Tabs tablet Generic drug:  apixaban TAKE ONE TABLET BY MOUTH TWICE DAILY What changed:  See the new instructions.   escitalopram 10 MG tablet Commonly known as:  LEXAPRO Take 1 tablet (10 mg total) by mouth daily.   furosemide 20 MG tablet Commonly known as:  LASIX Take 1 tablet (20 mg total) by mouth daily.   gabapentin 100 MG capsule Commonly  known as:  NEURONTIN TAKE ONE CAPSULE BY MOUTH TWICE DAILY BEFORE  A  MEAL   glimepiride 2 MG tablet Commonly known as:  AMARYL Take 1 tablet (2 mg total) by mouth daily with breakfast. TAKE ONE TABLET BY MOUTH ONCE DAILY BEFORE BREAKFAST   guaiFENesin 600 MG 12 hr tablet Commonly known as:  MUCINEX Take 600 mg by mouth 2 (two) times daily as needed for cough.   levothyroxine 100 MCG tablet Commonly known as:  SYNTHROID, LEVOTHROID Take 1 tablet (100 mcg total) by mouth daily before breakfast.   predniSONE 5 MG tablet Commonly known as:  DELTASONE TAKE 1 TABLET BY MOUTH ONCE DAILY WITH  BREAKFAST What changed:  See the new instructions.   triamcinolone cream 0.1 % Commonly known as:  KENALOG Apply 1 application topically 2 (two) times daily as needed.      Follow-up Information    [facility] Follow [facility].   Why:  HH nursing/physical therapy Contact information: [place] [address] [phone]        Carolann Littler  W, MD. Schedule an appointment as soon as possible for a visit in 1 week(s).   Specialty:  Family Medicine Contact information: [address] [place] [address] [phone]          Allergies  Allergen Reactions  . Codeine Sulfate Other (See Comments)    REACTION: GI upset  . Sulfonamide Derivatives Other (See Comments)    REACTION: GI upset  . Vancomycin Other (See Comments)    Red man syndrome  . Atarax [Hydroxyzine] Nausea Only and Rash    Consultations: None  Procedures/Studies: None Subjective: Seen and examined at bedside. He reported doing well. Feels much better today. Denied headache, dizziness, nausea, vomiting, chest pain, shortness of breath. No abdominal pain.  Discharge Exam: Vitals:   09/30/16 2054 10/01/16 0538  BP: 135/73 116/66  Pulse: 89 70  Resp: 20 20  Temp: 98.6 F (37 C) 98.6 F (37 C)   Vitals:   09/30/16 1859 09/30/16 2054 10/01/16 0538 10/01/16 1022   BP: (!) 101/54 135/73 116/66   Pulse: 79 89 70   Resp:  20 20   Temp: 98 F (36.7 C) 98.6 F (37 C) 98.6 F (37 C)   TempSrc: Oral Oral Oral   SpO2: 100% 100% 99% 93%  Weight:      Height:        General: Pt is alert, awake, not in acute distress Cardiovascular: RRR, S1/S2 +, no rubs, no gallops Respiratory: CTA bilaterally, no wheezing, no rhonchi Abdominal: Soft, NT, ND, bowel sounds + Extremities: no edema, no cyanosis    The results of significant diagnostics from this hospitalization (including imaging, microbiology, ancillary and laboratory) are listed below for reference.     Microbiology: Recent Results (from the past 240 hour(s))  Culture, blood (Routine x 2)     Status: None (Preliminary result)   Collection Time: 09/29/16  8:40 AM  Result Value Ref Range Status   Specimen Description BLOOD RIGHT FOREARM  Final   Special Requests   Final    BOTTLES DRAWN AEROBIC AND ANAEROBIC Blood Culture adequate volume   Culture   Final    NO GROWTH 1 DAY Performed at [facility], [address] [place], [place] [address]    Report Status PENDING  Incomplete  Culture, blood (Routine x 2)     Status: None (Preliminary result)   Collection Time: 09/29/16  8:46 AM  Result Value Ref Range Status   Specimen Description BLOOD RIGHT ARM  Final   Special Requests IN PEDIATRIC BOTTLE Blood Culture adequate volume  Final   Culture   Final    NO GROWTH 1 DAY Performed at [facility], [place] [address], [place], [place] [address]    Report Status PENDING  Incomplete  MRSA PCR Screening     Status: None   Collection Time: 09/30/16  1:20 PM  Result Value Ref Range Status   MRSA by PCR NEGATIVE NEGATIVE Final    Comment:        The GeneXpert MRSA Assay (FDA approved for NASAL specimens only), is one component of a comprehensive MRSA colonization surveillance program. It is not intended to diagnose MRSA infection nor to guide or monitor treatment for MRSA  infections.      Labs: BNP (last 3 results)  Recent Labs  04/02/16 1848 09/30/16 0546  BNP 1,332.1* 9,470.9*   Basic Metabolic Panel:  Recent Labs Lab 09/29/16 0847 09/30/16 0546 10/01/16 0704  NA 131* 135 136  K 4.1 4.0 3.7  CL 100* 105 102  CO2 20* 22 27  GLUCOSE 169* 124* 106*  BUN 30* 32* 35*  CREATININE 1.73* 1.66* 1.74*  CALCIUM 8.2* 8.4* 8.6*  MG  --   --  2.2   Liver Function Tests:  Recent Labs Lab 09/29/16 0847  AST 20  ALT 9*  ALKPHOS 79  BILITOT 1.8*  PROT 7.7  ALBUMIN 3.4*   No results for input(s): LIPASE, AMYLASE in the last 168 hours. No results for input(s): AMMONIA in the last 168 hours. CBC:  Recent Labs Lab 09/29/16 0847 09/30/16 0546 10/01/16 0704  WBC 18.8* 16.1* 9.2  NEUTROABS 16.6*  --   --   HGB 8.2* 7.8* 8.0*  HCT 24.7* 23.9* 24.3*  MCV 93.9 93.0 93.1  PLT 247 252 285   Cardiac Enzymes: No results for input(s): CKTOTAL, CKMB, CKMBINDEX, TROPONINI in the last 168 hours. BNP: Invalid input(s): POCBNP CBG:  Recent Labs Lab 09/30/16 1138 09/30/16 1756 09/30/16 2100 10/01/16 0741 10/01/16 1154  GLUCAP 183* 181* 193* 103* 154*   D-Dimer No results for input(s): DDIMER in the last 72 hours. Hgb A1c  Recent Labs  09/29/16 1330  HGBA1C 5.7*   Lipid Profile No results for input(s): CHOL, HDL, LDLCALC, TRIG, CHOLHDL, LDLDIRECT in the last 72 hours. Thyroid function studies  Recent Labs  09/29/16 1330  TSH 3.286   Anemia work up No results for input(s): VITAMINB12, FOLATE, FERRITIN, TIBC, IRON, RETICCTPCT in the last 72 hours. Urinalysis    Component Value Date/Time   COLORURINE YELLOW 09/29/2016 0930   APPEARANCEUR CLEAR 09/29/2016 0930   LABSPEC 1.014 09/29/2016 0930   PHURINE 6.0 09/29/2016 0930   GLUCOSEU NEGATIVE 09/29/2016 0930   HGBUR MODERATE (A) 09/29/2016 0930   BILIRUBINUR NEGATIVE 09/29/2016 0930   BILIRUBINUR n 12/30/2010 1700   KETONESUR 5 (A) 09/29/2016 0930   PROTEINUR 100 (A)  09/29/2016 0930   UROBILINOGEN 1.0 09/25/2014 1550   NITRITE NEGATIVE 09/29/2016 0930   LEUKOCYTESUR LARGE (A) 09/29/2016 0930   Sepsis Labs Invalid input(s): PROCALCITONIN,  WBC,  LACTICIDVEN Microbiology Recent Results (from the past 240 hour(s))  Culture, blood (Routine x 2)     Status: None (Preliminary result)   Collection Time: 09/29/16  8:40 AM  Result Value Ref Range Status   Specimen Description BLOOD RIGHT FOREARM  Final   Special Requests   Final    BOTTLES DRAWN AEROBIC AND ANAEROBIC Blood Culture adequate volume   Culture   Final    NO GROWTH 1 DAY Performed at [facility], [place] [address] [place], [place] [address]    Report Status PENDING  Incomplete  Culture, blood (Routine x 2)     Status: None (Preliminary result)   Collection Time: 09/29/16  8:46 AM  Result Value Ref Range Status   Specimen Description BLOOD RIGHT ARM  Final   Special Requests IN PEDIATRIC BOTTLE Blood Culture adequate volume  Final   Culture   Final    NO GROWTH 1 DAY Performed at [facility], [place] [address], [place], [place] [address]    Report Status PENDING  Incomplete  MRSA PCR Screening     Status: None   Collection Time: 09/30/16  1:20 PM  Result Value Ref Range Status   MRSA by PCR NEGATIVE NEGATIVE Final    Comment:        The GeneXpert MRSA Assay (FDA approved for NASAL specimens only), is one component of a comprehensive  MRSA colonization surveillance program. It is not intended to diagnose MRSA infection nor to guide or monitor treatment for MRSA infections.      Time coordinating discharge: 33 minutes  SIGNED:   Rosita Fire, MD  [facility] 10/01/2016, 1:02 PM  If 7PM-7AM, please contact night-coverage [email or web address] Password [email or web address]

## 2016-10-01 NOTE — Care Management Note (Signed)
Case Management Note  Patient Details  Name: [name] [name] MRN: [id number] Date of Birth: 05/01/1932  Subjective/Objective: [facility] chosen for HHC-HHRN/HHPT-rep Kim aware. Noted patient declined [facility], advertising resources. Has home 02 @ HS prior to admission. No further CM needs.                    Action/Plan:d/c home w/HHC.   Expected Discharge Date:   (unknown)               Expected Discharge Plan:  [place]  In-House Referral:     Discharge planning Services  CM Consult  Post Acute Care Choice:  [facility] (Used [facility] in past) Choice offered to:  Patient, Spouse  DME Arranged:    DME Agency:     HH Arranged:  RN, PT [place] Agency:  [facility]  Status of Service:  Completed, signed off  If discussed at [place] of Stay Meetings, dates discussed:    Additional Comments:  Dessa Phi, RN 10/01/2016, 12:01 PM

## 2016-10-01 NOTE — Progress Notes (Signed)
Patient oxygen saturation is 96% on room air at rest.  Patient oxygen saturation while ambulating on room air is 91%.  Dr. Carolin Sicks aware.

## 2016-10-02 ENCOUNTER — Telehealth: Payer: Self-pay | Admitting: *Deleted

## 2016-10-02 NOTE — Telephone Encounter (Signed)
D/C 10/01/16  Hosp DX: PNA   Transition Care Management Follow-up Telephone Call  How have you been since you were released from the hospital?Patient reports that she has been feeling great since she was discharged and glad to be home   Do you understand why you were in the hospital? "Yes, I had pneumonia"   Do you understand the discharge instrcutions? "Yes, I am taking my medication and resting. I am following a healthy diet" Asked patient about respiratory hygiene/exercies, patient states she has been doing some. Reviewed deep breathing exercises and instructed on purpose of deep breathing exercises. Patient voiced understanding.   Items Reviewed:  Medications reviewed: Yes, patient started on Augmentin in the hospital. Educated patient on purpose, actions, and side effects. Instructed patient on ensure to take medication as prescribed and to complete all doses of medication.   Allergies reviewed: Yes, no changes  Dietary changes reviewed: Patient educated on cho mod/heart healthy diet choices.   Referrals reviewed: No   Functional Questionnaire:   Activities of Daily Living (ADLs):   She states they are independent in the following: ambulation, bathing and hygiene, feeding, grooming, toileting and dressing States they require assistance with the following: None   Any transportation issues/concerns?: No, patient has 3 sons who are willing and able to assist in patient care including transportation to appointments.   Any patient concerns?  Patient reports that she has had loose stool x2 when she urinates. Patient denies any fever, abdomen pain, or uncontrollable diarrhea. Educated patient on s/s of CDIFF and when to report symptoms.   Confirmed importance and date/time of follow-up visits scheduled: yes, patient scheduled for follow up with Dr. Elease Hashimoto on 10/07/16 at 3pm Confirmed with patient if condition begins to worsen call PCP or go to the ER.  Patient was given the  Call-a-Nurse line [phone]: Yes

## 2016-10-03 DIAGNOSIS — M3130 Wegener's granulomatosis without renal involvement: Secondary | ICD-10-CM | POA: Diagnosis not present

## 2016-10-03 DIAGNOSIS — F329 Major depressive disorder, single episode, unspecified: Secondary | ICD-10-CM | POA: Diagnosis not present

## 2016-10-03 DIAGNOSIS — E1122 Type 2 diabetes mellitus with diabetic chronic kidney disease: Secondary | ICD-10-CM | POA: Diagnosis not present

## 2016-10-03 DIAGNOSIS — I5032 Chronic diastolic (congestive) heart failure: Secondary | ICD-10-CM | POA: Diagnosis not present

## 2016-10-03 DIAGNOSIS — I272 Pulmonary hypertension, unspecified: Secondary | ICD-10-CM | POA: Diagnosis not present

## 2016-10-03 DIAGNOSIS — Z96651 Presence of right artificial knee joint: Secondary | ICD-10-CM | POA: Diagnosis not present

## 2016-10-03 DIAGNOSIS — Z7952 Long term (current) use of systemic steroids: Secondary | ICD-10-CM | POA: Diagnosis not present

## 2016-10-03 DIAGNOSIS — J440 Chronic obstructive pulmonary disease with acute lower respiratory infection: Secondary | ICD-10-CM | POA: Diagnosis not present

## 2016-10-03 DIAGNOSIS — Z853 Personal history of malignant neoplasm of breast: Secondary | ICD-10-CM | POA: Diagnosis not present

## 2016-10-03 DIAGNOSIS — N183 Chronic kidney disease, stage 3 (moderate): Secondary | ICD-10-CM | POA: Diagnosis not present

## 2016-10-03 DIAGNOSIS — Z8673 Personal history of transient ischemic attack (TIA), and cerebral infarction without residual deficits: Secondary | ICD-10-CM | POA: Diagnosis not present

## 2016-10-03 DIAGNOSIS — Z7984 Long term (current) use of oral hypoglycemic drugs: Secondary | ICD-10-CM | POA: Diagnosis not present

## 2016-10-03 DIAGNOSIS — Z7901 Long term (current) use of anticoagulants: Secondary | ICD-10-CM | POA: Diagnosis not present

## 2016-10-03 DIAGNOSIS — J9621 Acute and chronic respiratory failure with hypoxia: Secondary | ICD-10-CM | POA: Diagnosis not present

## 2016-10-03 DIAGNOSIS — E039 Hypothyroidism, unspecified: Secondary | ICD-10-CM | POA: Diagnosis not present

## 2016-10-03 DIAGNOSIS — J189 Pneumonia, unspecified organism: Secondary | ICD-10-CM | POA: Diagnosis not present

## 2016-10-03 DIAGNOSIS — Z9981 Dependence on supplemental oxygen: Secondary | ICD-10-CM | POA: Diagnosis not present

## 2016-10-03 DIAGNOSIS — I482 Chronic atrial fibrillation: Secondary | ICD-10-CM | POA: Diagnosis not present

## 2016-10-04 LAB — CULTURE, BLOOD (ROUTINE X 2)
Culture: NO GROWTH
Culture: NO GROWTH
Special Requests: ADEQUATE
Special Requests: ADEQUATE

## 2016-10-07 ENCOUNTER — Ambulatory Visit (INDEPENDENT_AMBULATORY_CARE_PROVIDER_SITE_OTHER): Payer: Medicare Other | Admitting: Family Medicine

## 2016-10-07 ENCOUNTER — Encounter: Payer: Self-pay | Admitting: Family Medicine

## 2016-10-07 ENCOUNTER — Other Ambulatory Visit: Payer: Self-pay | Admitting: Family Medicine

## 2016-10-07 VITALS — BP 120/70 | HR 58 | Temp 98.3°F | Wt 162.0 lb

## 2016-10-07 DIAGNOSIS — M3130 Wegener's granulomatosis without renal involvement: Secondary | ICD-10-CM

## 2016-10-07 DIAGNOSIS — N183 Chronic kidney disease, stage 3 unspecified: Secondary | ICD-10-CM

## 2016-10-07 DIAGNOSIS — J181 Lobar pneumonia, unspecified organism: Secondary | ICD-10-CM

## 2016-10-07 DIAGNOSIS — I482 Chronic atrial fibrillation, unspecified: Secondary | ICD-10-CM

## 2016-10-07 DIAGNOSIS — D638 Anemia in other chronic diseases classified elsewhere: Secondary | ICD-10-CM

## 2016-10-07 DIAGNOSIS — J189 Pneumonia, unspecified organism: Secondary | ICD-10-CM

## 2016-10-07 DIAGNOSIS — E1121 Type 2 diabetes mellitus with diabetic nephropathy: Secondary | ICD-10-CM | POA: Diagnosis not present

## 2016-10-07 DIAGNOSIS — I10 Essential (primary) hypertension: Secondary | ICD-10-CM

## 2016-10-07 LAB — CBC WITH DIFFERENTIAL/PLATELET
BASOS PCT: 1 %
Basophils Absolute: 92 cells/uL (ref 0–200)
EOS ABS: 368 {cells}/uL (ref 15–500)
Eosinophils Relative: 4 %
HEMATOCRIT: 27 % — AB (ref 35.0–45.0)
Hemoglobin: 8.7 g/dL — ABNORMAL LOW (ref 11.7–15.5)
LYMPHS PCT: 26 %
Lymphs Abs: 2392 cells/uL (ref 850–3900)
MCH: 30.2 pg (ref 27.0–33.0)
MCHC: 32.2 g/dL (ref 32.0–36.0)
MCV: 93.8 fL (ref 80.0–100.0)
MONO ABS: 1012 {cells}/uL — AB (ref 200–950)
MONOS PCT: 11 %
MPV: 8.3 fL (ref 7.5–12.5)
NEUTROS PCT: 58 %
Neutro Abs: 5336 cells/uL (ref 1500–7800)
PLATELETS: 407 10*3/uL — AB (ref 140–400)
RBC: 2.88 MIL/uL — ABNORMAL LOW (ref 3.80–5.10)
RDW: 14 % (ref 11.0–15.0)
WBC: 9.2 10*3/uL (ref 3.8–10.8)

## 2016-10-07 LAB — BASIC METABOLIC PANEL
BUN: 20 mg/dL (ref 7–25)
CALCIUM: 8.4 mg/dL — AB (ref 8.6–10.4)
CHLORIDE: 99 mmol/L (ref 98–110)
CO2: 25 mmol/L (ref 20–31)
CREATININE: 1.61 mg/dL — AB (ref 0.60–0.88)
Glucose, Bld: 96 mg/dL (ref 65–99)
Potassium: 4.6 mmol/L (ref 3.5–5.3)
Sodium: 133 mmol/L — ABNORMAL LOW (ref 135–146)

## 2016-10-07 NOTE — Progress Notes (Signed)
Subjective:     Patient ID: [name] [name], female   DOB: 04/26/1932, 83 y.o.   MRN: [id number]  HPI Hospital follow-up. Patient has multiple chronic problems including obesity, type 2 diabetes, hypothyroidism, atrial fibrillation, diastolic heart failure, COPD, awake and Wegeners granulomatosis, chronic kidney disease, hypertension. She was seen here few days prior to admission with cough and was felt to have viral illness. She subsequently developed some fever and increased shortness breath  and vomiting. She was felt to have left lower lobe pneumonia by x-ray. Treated with broad-spectrum antibiotics and discharged on Augmentin.  Patient finished up Augmentin yesterday. She feels back to baseline at this time. She has some chronic dyspnea. She is on nighttime oxygen. No recurrent fever. No cough.  Increased fatigue.    Chronic kidney disease. Creatinine was up slightly around 1.7 range during hospitalization. She did receive some Lasix with some question initially of some mild pulmonary edema.  She has chronic anemia which is normocytic. Hemoglobin at discharge 8.0. Initial white count 18.8 and this had come down to normal prior to discharge.  Type 2 diabetes. A1c 5.7%. This has been very stable and well controlled  Past Medical History:  Diagnosis Date  . Arthritis    r tkr  . Breast cancer ([place])   . Chronic atrial fibrillation (HCC)    CHADS2-VASc=6.  on low dose Eliquis (corrected for age & renal fxn)  . CVA (cerebral vascular accident) ([place]) 2008   mini stroke.no residual  . DEPRESSION 05/16/2009  . Diabetes mellitus type II   . Heart murmur    stress test 2009.dr peter Martinique  . HYPERLIPIDEMIA 07/24/2008  . HYPERTENSION 07/24/2008  . HYPOTHYROIDISM 07/24/2008  . Intermittent vertigo   . Pneumonia   . Pulmonary hypertension ([place])    Mod PHTN on Echo 03/2016: 2/2 combination of Wegener's, HFPF & Afib.  . Skin abnormality    facial lesions .pt applying mupiracin to areas  . Vertigo    . Wegener's disease, pulmonary ([place]) 10/2012   Past Surgical History:  Procedure Laterality Date  . BREAST SURGERY  2007   Lumpectomy, XRT 2006.l breast  . CHOLECYSTECTOMY  1980  . EXPLORATORY LAPAROTOMY    . HEEL SPUR SURGERY Right   . JOINT REPLACEMENT     r knee  . KNEE SURGERY  2009   TKR  . TRANSTHORACIC ECHOCARDIOGRAM  03/2016   Afib (no Diastolic Fxn). EF 55-60%. No RWMA. Mild Aortic dilation Mild MR. Severe LA dilation. Mod RA dilation.  Mod TR with Mod-Severe Pulmonary HTN - but normal RV function..  . VENTRAL HERNIA REPAIR N/A 07/01/2016   Procedure: REPAIR OF INCARCERATED INCISIONAL HERNIA ;  Surgeon: Stark Klein, MD;  Location: [facility];  Service: General;  Laterality: N/A;  . VIDEO ASSISTED THORACOSCOPY  04/22/2011   Procedure: VIDEO ASSISTED THORACOSCOPY;  Surgeon: Melrose Nakayama, MD;  Location: [facility];  Service: Thoracic;  Laterality: Left;  WITH BIOPSY    reports that she quit smoking about 34 years ago. Her smoking use included Cigarettes. She has a 6.00 pack-year smoking history. She has never used smokeless tobacco. She reports that she does not drink alcohol or use drugs. family history includes Arthritis in her mother; Breast cancer in her sister; Cancer in her sister; Coronary artery disease in her brother and father; Heart disease in her father; Lung cancer in her brother; Rheum arthritis in her mother. Allergies  Allergen Reactions  . Codeine Sulfate Other (See Comments)    REACTION:  GI upset  . Sulfonamide Derivatives Other (See Comments)    REACTION: GI upset  . Vancomycin Other (See Comments)    Red man syndrome  . Atarax [Hydroxyzine] Nausea Only and Rash     Review of Systems  Constitutional: Positive for fatigue.  Respiratory: Positive for shortness of breath. Negative for cough and wheezing.   Cardiovascular: Negative for chest pain, palpitations and leg swelling.  Gastrointestinal: Negative for abdominal pain, nausea and vomiting.   Genitourinary: Negative for dysuria.       Objective:   Physical Exam  Constitutional: She appears well-developed and well-nourished.  HENT:  Mouth/Throat: Oropharynx is clear and moist.  Neck: Neck supple.  Cardiovascular: Normal rate and regular rhythm.   Pulmonary/Chest:  No wheezes. Patient has some crackles in both bases which may be chronic. No respiratory distress. Pulse oximetry 98%  Musculoskeletal: She exhibits no edema.  Lymphadenopathy:    She has no cervical adenopathy.       Assessment:     #1 recent left lower lobe pneumonia. Clinically improved  #2 chronic kidney disease  #3 chronic atrial fibrillation on anticoagulation and rate controlled with beta blocker  #4 history of Wegener's granulomatosis on chronic low-dose prednisone  #5 hypothyroidism on Synthroid  #6 type 2 diabetes well controlled    Plan:     -Follow-up promptly for any fever or increasing shortness of breath -Repeat CBC and basic metabolic panel -continue with night time use of O2. -Routine follow-up in 3 months and sooner as needed  [name] MD [facility]

## 2016-10-10 DIAGNOSIS — I5032 Chronic diastolic (congestive) heart failure: Secondary | ICD-10-CM | POA: Diagnosis not present

## 2016-10-10 DIAGNOSIS — J440 Chronic obstructive pulmonary disease with acute lower respiratory infection: Secondary | ICD-10-CM | POA: Diagnosis not present

## 2016-10-10 DIAGNOSIS — J9621 Acute and chronic respiratory failure with hypoxia: Secondary | ICD-10-CM | POA: Diagnosis not present

## 2016-10-10 DIAGNOSIS — Z0289 Encounter for other administrative examinations: Secondary | ICD-10-CM | POA: Diagnosis not present

## 2016-10-10 DIAGNOSIS — E1122 Type 2 diabetes mellitus with diabetic chronic kidney disease: Secondary | ICD-10-CM | POA: Diagnosis not present

## 2016-10-10 DIAGNOSIS — J189 Pneumonia, unspecified organism: Secondary | ICD-10-CM | POA: Diagnosis not present

## 2016-10-10 DIAGNOSIS — N183 Chronic kidney disease, stage 3 (moderate): Secondary | ICD-10-CM | POA: Diagnosis not present

## 2016-10-13 DIAGNOSIS — J9621 Acute and chronic respiratory failure with hypoxia: Secondary | ICD-10-CM | POA: Diagnosis not present

## 2016-10-13 DIAGNOSIS — J440 Chronic obstructive pulmonary disease with acute lower respiratory infection: Secondary | ICD-10-CM | POA: Diagnosis not present

## 2016-10-13 DIAGNOSIS — J189 Pneumonia, unspecified organism: Secondary | ICD-10-CM | POA: Diagnosis not present

## 2016-10-13 DIAGNOSIS — E1122 Type 2 diabetes mellitus with diabetic chronic kidney disease: Secondary | ICD-10-CM | POA: Diagnosis not present

## 2016-10-13 DIAGNOSIS — I5032 Chronic diastolic (congestive) heart failure: Secondary | ICD-10-CM | POA: Diagnosis not present

## 2016-10-13 DIAGNOSIS — N183 Chronic kidney disease, stage 3 (moderate): Secondary | ICD-10-CM | POA: Diagnosis not present

## 2016-10-16 ENCOUNTER — Encounter: Payer: Self-pay | Admitting: Family Medicine

## 2016-10-16 ENCOUNTER — Ambulatory Visit (INDEPENDENT_AMBULATORY_CARE_PROVIDER_SITE_OTHER): Payer: Medicare Other | Admitting: Family Medicine

## 2016-10-16 VITALS — BP 120/80 | HR 60 | Temp 99.0°F | Wt 157.6 lb

## 2016-10-16 DIAGNOSIS — J029 Acute pharyngitis, unspecified: Secondary | ICD-10-CM | POA: Diagnosis not present

## 2016-10-16 DIAGNOSIS — R0982 Postnasal drip: Secondary | ICD-10-CM

## 2016-10-16 LAB — POCT RAPID STREP A (OFFICE): Rapid Strep A Screen: NEGATIVE

## 2016-10-16 MED ORDER — AZELASTINE HCL 0.1 % NA SOLN: 1.0000 | Freq: Two times a day (BID) | NASAL | 12 refills | Status: DC

## 2016-10-16 NOTE — Patient Instructions (Addendum)
You can take 10 mg prednisone daily for 5 days and then follow up with your pulmonologist if symptoms have not improved.  Also, please use Astelin nasal spray for post nasal drip which is irritating your throat.  Consider also using warm salt water gargle for your throat.  If symptoms do not improve, worsen, or you develop new symptoms such as fever >100, please follow up for further evaluation and treatment.

## 2016-10-16 NOTE — Progress Notes (Signed)
Patient ID: [name] [name], female   DOB: [birth year], 83 y.o.   MRN: [id number]  PCP: [name], MD  Subjective:  [name] [name] is a 83 y.o. year old very pleasant female patient who presents with sore throat, post nasal drip, occasional cough that is nonproductive -started: 9 to 10 days, symptoms are not improving -previous treatments: Mucinex has not provided benefit -sick contacts/travel/risks: denies flu exposure; no strep or tick exposure -Hx of: COPD No alleviating or aggravating factors noted Recent history of Community acquired pneumonia 09/29/16; completed augmentin 10/06/16; she reports that pneumonia improved but sore throat has not  ROS-denies fever, SOB, NVD, nasal congestion, sinus pressure/pain,  tooth pain  Pertinent Past Medical History- obesity, Wegener's granulomatosis, pulmonary hypertension, essential HTN, diastolic heart failure, atrial fibrillation, T2DM, hypothyroidism, and chronic kidney disease  Medications- reviewed  Current Outpatient Prescriptions  Medication Sig Dispense Refill  . acetaminophen (TYLENOL) 500 MG tablet Take 500 mg by mouth every 6 (six) hours as needed for headache.    Marland Kitchen amLODipine (NORVASC) 5 MG tablet TAKE ONE TABLET BY MOUTH ONCE DAILY. 90 tablet 2  . benzonatate (TESSALON PERLES) 100 MG capsule Take 1 capsule (100 mg total) by mouth 3 (three) times daily as needed. 20 capsule 0  . BYSTOLIC 10 MG tablet TAKE ONE TABLET BY MOUTH ONCE DAILY AFTER  BREAKFAST. (Patient taking differently: TAKE 26m TABLET BY MOUTH ONCE DAILY AFTER  BREAKFAST.) 30 tablet 11  . diazepam (VALIUM) 5 MG tablet Take one tablet by mouth as needed for severe vertigo flares (Patient taking differently: Take 5 mg by mouth daily as needed (severe vertigo..Marland Kitchen. ) 20 tablet 0  . ELIQUIS 2.5 MG TABS tablet TAKE ONE TABLET BY MOUTH TWICE DAILY (Patient taking differently: TAKE 2.549mTABLET BY MOUTH TWICE DAILY) 180 tablet 1  . escitalopram (LEXAPRO) 10 MG tablet Take 1 tablet  (10 mg total) by mouth daily. 30 tablet 6  . furosemide (LASIX) 20 MG tablet Take 1 tablet (20 mg total) by mouth daily. 90 tablet 2  . gabapentin (NEURONTIN) 100 MG capsule TAKE ONE CAPSULE BY MOUTH TWICE DAILY BEFORE  A  MEAL 60 capsule 5  . glimepiride (AMARYL) 2 MG tablet Take 1 tablet (2 mg total) by mouth daily with breakfast. TAKE ONE TABLET BY MOUTH ONCE DAILY BEFORE BREAKFAST 60 tablet 0  . glimepiride (AMARYL) 2 MG tablet TAKE ONE TABLET BY MOUTH ONCE DAILY BEFORE BREAKFAST 90 tablet 1  . guaiFENesin (MUCINEX) 600 MG 12 hr tablet Take 600 mg by mouth 2 (two) times daily as needed for cough.    . levothyroxine (SYNTHROID, LEVOTHROID) 100 MCG tablet TAKE ONE TABLET BY MOUTH ONCE DAILY BEFORE BREAKFAST 90 tablet 2  . predniSONE (DELTASONE) 5 MG tablet TAKE 1 TABLET BY MOUTH ONCE DAILY WITH  BREAKFAST (Patient taking differently: TAKE 1 TABLET BY MOUTH EVERY OTHER DAY WITH BREAKFAST) 30 tablet 0  . triamcinolone cream (KENALOG) 0.1 % Apply 1 application topically 2 (two) times daily as needed. 453 g 1   No current facility-administered medications for this visit.     Objective: BP 120/80 (BP Location: Left Arm, Patient Position: Sitting, Cuff Size: Normal)   Pulse 60   Temp 99 F (37.2 C) (Oral)   Wt 157 lb 9.6 oz (71.5 kg)   SpO2 96%   BMI 25.44 kg/m  Gen: NAD, resting comfortably HEENT: Turbinates erythematous, TM normal, pharynx mildly erythematous with no tonsilar exudate or edema, no sinus tenderness, post nasal drip present  CV: RRR no murmurs rubs or gallops Lungs: CTAB no crackles, wheeze, rhonchi Abdomen: soft/nontender/nondistended/normal bowel sounds. No rebound or guarding.  Ext: no edema Skin: warm, dry, no rash Neuro: grossly normal, moves all extremities  Assessment/Plan: 1. Sore throat Rapid Strep negative; post nasal drip present; suspect chronic irritation from post nasal drip.  - azelastine (ASTELIN) 0.1 % nasal spray; Place 1 spray into both nostrils 2 (two)  times daily. Use in each nostril as directed  Dispense: 30 mL; Refill: 12 - POCT rapid strep A  2. Post-nasal drip   We reviewed the reasons for antibiotic therapy and risks/benefits and misuse of antibiotics.  Exam is reassuring today; suspect that chronic post nasal drip has irritated throat; advised patient to take 10 mg of prednisone for 5 days and then return to routine dose of 5 mg that has been prescribed by pulmonology.  Further advised her to follow up with pulmonologist as recommended.  Also advised conservative measures of warm salt water gargle and close follow up if symptoms do not improve with treatment, worsen, or she develops a fever.   We discussed that we did not find any infection that had higher probability of being bacterial such as pneumonia or strep throat. We discussed signs that bacterial infection may have developed particularly fever or shortness of breath. Likely course of 1-2 weeks.  Finally, we reviewed reasons to return to care including if symptoms worsen or persist or new concerns arise- once again particularly shortness of breath or fever.    Laurita Quint, FNP

## 2016-10-20 ENCOUNTER — Telehealth: Payer: Self-pay | Admitting: *Deleted

## 2016-10-20 ENCOUNTER — Other Ambulatory Visit: Payer: Self-pay | Admitting: Cardiology

## 2016-10-20 NOTE — Telephone Encounter (Signed)
Patient reports that she is still having nasal congestion, sinus pressure, headaches, slight cough with white sputum; patient denies shortness of breath or chest congestion; patient has been having symptoms since 10/15/16; patient saw Delano Metz, NP on 10/16/16; patient reports treatment at that visit was ineffective. Please advise.

## 2016-10-21 DIAGNOSIS — J440 Chronic obstructive pulmonary disease with acute lower respiratory infection: Secondary | ICD-10-CM | POA: Diagnosis not present

## 2016-10-21 DIAGNOSIS — I5032 Chronic diastolic (congestive) heart failure: Secondary | ICD-10-CM | POA: Diagnosis not present

## 2016-10-21 DIAGNOSIS — E1122 Type 2 diabetes mellitus with diabetic chronic kidney disease: Secondary | ICD-10-CM | POA: Diagnosis not present

## 2016-10-21 DIAGNOSIS — N183 Chronic kidney disease, stage 3 (moderate): Secondary | ICD-10-CM | POA: Diagnosis not present

## 2016-10-21 DIAGNOSIS — J9621 Acute and chronic respiratory failure with hypoxia: Secondary | ICD-10-CM | POA: Diagnosis not present

## 2016-10-21 DIAGNOSIS — J189 Pneumonia, unspecified organism: Secondary | ICD-10-CM | POA: Diagnosis not present

## 2016-10-21 NOTE — Telephone Encounter (Signed)
Notified patient of advice per PCP; patient voiced understanding to instructions.

## 2016-10-21 NOTE — Telephone Encounter (Signed)
If no fever would observe.  May add OTC Flonase or Nasacort for nasal congestion symptoms.

## 2016-10-22 ENCOUNTER — Telehealth: Payer: Self-pay | Admitting: Family Medicine

## 2016-10-22 NOTE — Telephone Encounter (Signed)
Ok to order 

## 2016-10-22 NOTE — Telephone Encounter (Signed)
[name] [name] is calling to let md know the patient has not been feeling well and elizabeth would like to have verbal  order for nursing consultation

## 2016-10-22 NOTE — Telephone Encounter (Signed)
OK

## 2016-10-22 NOTE — Telephone Encounter (Signed)
Notified of approval for home health nursing eval.

## 2016-10-23 DIAGNOSIS — N183 Chronic kidney disease, stage 3 (moderate): Secondary | ICD-10-CM | POA: Diagnosis not present

## 2016-10-23 DIAGNOSIS — E1122 Type 2 diabetes mellitus with diabetic chronic kidney disease: Secondary | ICD-10-CM | POA: Diagnosis not present

## 2016-10-23 DIAGNOSIS — I5032 Chronic diastolic (congestive) heart failure: Secondary | ICD-10-CM | POA: Diagnosis not present

## 2016-10-23 DIAGNOSIS — J440 Chronic obstructive pulmonary disease with acute lower respiratory infection: Secondary | ICD-10-CM | POA: Diagnosis not present

## 2016-10-23 DIAGNOSIS — J9621 Acute and chronic respiratory failure with hypoxia: Secondary | ICD-10-CM | POA: Diagnosis not present

## 2016-10-23 DIAGNOSIS — J189 Pneumonia, unspecified organism: Secondary | ICD-10-CM | POA: Diagnosis not present

## 2016-10-28 ENCOUNTER — Telehealth: Payer: Self-pay | Admitting: Family Medicine

## 2016-10-28 DIAGNOSIS — N183 Chronic kidney disease, stage 3 (moderate): Secondary | ICD-10-CM | POA: Diagnosis not present

## 2016-10-28 DIAGNOSIS — I5032 Chronic diastolic (congestive) heart failure: Secondary | ICD-10-CM | POA: Diagnosis not present

## 2016-10-28 DIAGNOSIS — J9621 Acute and chronic respiratory failure with hypoxia: Secondary | ICD-10-CM | POA: Diagnosis not present

## 2016-10-28 DIAGNOSIS — E1122 Type 2 diabetes mellitus with diabetic chronic kidney disease: Secondary | ICD-10-CM | POA: Diagnosis not present

## 2016-10-28 DIAGNOSIS — J189 Pneumonia, unspecified organism: Secondary | ICD-10-CM | POA: Diagnosis not present

## 2016-10-28 DIAGNOSIS — J440 Chronic obstructive pulmonary disease with acute lower respiratory infection: Secondary | ICD-10-CM | POA: Diagnosis not present

## 2016-10-28 NOTE — Telephone Encounter (Signed)
Patient is aware and will call back as needed.

## 2016-10-28 NOTE — Telephone Encounter (Signed)
Pt state that she has stuff head and still have a sore throat low oxygen this morning when the nurse came in and would like to see if she could get something to knock this congestion that is in her head.  Pt refuse appointment and state that she has tried OTC stuff and nothing has helped.  Pharm:  [facility] on [address].

## 2016-10-28 NOTE — Telephone Encounter (Signed)
We cannot use decongestants with her medical issues. She can safely take OTC plain Mucinex.

## 2016-10-31 DIAGNOSIS — I5032 Chronic diastolic (congestive) heart failure: Secondary | ICD-10-CM | POA: Diagnosis not present

## 2016-10-31 DIAGNOSIS — J440 Chronic obstructive pulmonary disease with acute lower respiratory infection: Secondary | ICD-10-CM | POA: Diagnosis not present

## 2016-10-31 DIAGNOSIS — E1122 Type 2 diabetes mellitus with diabetic chronic kidney disease: Secondary | ICD-10-CM | POA: Diagnosis not present

## 2016-10-31 DIAGNOSIS — N183 Chronic kidney disease, stage 3 (moderate): Secondary | ICD-10-CM | POA: Diagnosis not present

## 2016-10-31 DIAGNOSIS — J189 Pneumonia, unspecified organism: Secondary | ICD-10-CM | POA: Diagnosis not present

## 2016-10-31 DIAGNOSIS — J9621 Acute and chronic respiratory failure with hypoxia: Secondary | ICD-10-CM | POA: Diagnosis not present

## 2016-11-04 DIAGNOSIS — J189 Pneumonia, unspecified organism: Secondary | ICD-10-CM | POA: Diagnosis not present

## 2016-11-04 DIAGNOSIS — N183 Chronic kidney disease, stage 3 (moderate): Secondary | ICD-10-CM | POA: Diagnosis not present

## 2016-11-04 DIAGNOSIS — I5032 Chronic diastolic (congestive) heart failure: Secondary | ICD-10-CM | POA: Diagnosis not present

## 2016-11-04 DIAGNOSIS — J9621 Acute and chronic respiratory failure with hypoxia: Secondary | ICD-10-CM | POA: Diagnosis not present

## 2016-11-04 DIAGNOSIS — E1122 Type 2 diabetes mellitus with diabetic chronic kidney disease: Secondary | ICD-10-CM | POA: Diagnosis not present

## 2016-11-04 DIAGNOSIS — J440 Chronic obstructive pulmonary disease with acute lower respiratory infection: Secondary | ICD-10-CM | POA: Diagnosis not present

## 2016-11-11 DIAGNOSIS — J189 Pneumonia, unspecified organism: Secondary | ICD-10-CM | POA: Diagnosis not present

## 2016-11-11 DIAGNOSIS — J440 Chronic obstructive pulmonary disease with acute lower respiratory infection: Secondary | ICD-10-CM | POA: Diagnosis not present

## 2016-11-11 DIAGNOSIS — N183 Chronic kidney disease, stage 3 (moderate): Secondary | ICD-10-CM | POA: Diagnosis not present

## 2016-11-11 DIAGNOSIS — J9621 Acute and chronic respiratory failure with hypoxia: Secondary | ICD-10-CM | POA: Diagnosis not present

## 2016-11-11 DIAGNOSIS — E1122 Type 2 diabetes mellitus with diabetic chronic kidney disease: Secondary | ICD-10-CM | POA: Diagnosis not present

## 2016-11-11 DIAGNOSIS — I5032 Chronic diastolic (congestive) heart failure: Secondary | ICD-10-CM | POA: Diagnosis not present

## 2016-11-17 ENCOUNTER — Other Ambulatory Visit: Payer: Self-pay | Admitting: Cardiology

## 2016-11-18 DIAGNOSIS — I5032 Chronic diastolic (congestive) heart failure: Secondary | ICD-10-CM | POA: Diagnosis not present

## 2016-11-18 DIAGNOSIS — J440 Chronic obstructive pulmonary disease with acute lower respiratory infection: Secondary | ICD-10-CM | POA: Diagnosis not present

## 2016-11-18 DIAGNOSIS — J9621 Acute and chronic respiratory failure with hypoxia: Secondary | ICD-10-CM | POA: Diagnosis not present

## 2016-11-18 DIAGNOSIS — J189 Pneumonia, unspecified organism: Secondary | ICD-10-CM | POA: Diagnosis not present

## 2016-11-18 DIAGNOSIS — E1122 Type 2 diabetes mellitus with diabetic chronic kidney disease: Secondary | ICD-10-CM | POA: Diagnosis not present

## 2016-11-18 DIAGNOSIS — N183 Chronic kidney disease, stage 3 (moderate): Secondary | ICD-10-CM | POA: Diagnosis not present

## 2016-12-15 ENCOUNTER — Other Ambulatory Visit: Payer: Self-pay | Admitting: Family Medicine

## 2016-12-23 ENCOUNTER — Other Ambulatory Visit: Payer: Self-pay | Admitting: Cardiology

## 2016-12-24 NOTE — Telephone Encounter (Signed)
Refill for 6 months.

## 2017-01-07 ENCOUNTER — Encounter: Payer: Self-pay | Admitting: Family Medicine

## 2017-01-07 ENCOUNTER — Ambulatory Visit (INDEPENDENT_AMBULATORY_CARE_PROVIDER_SITE_OTHER): Payer: Medicare Other | Admitting: Family Medicine

## 2017-01-07 VITALS — BP 130/70 | HR 65 | Temp 98.0°F

## 2017-01-07 DIAGNOSIS — E1121 Type 2 diabetes mellitus with diabetic nephropathy: Secondary | ICD-10-CM

## 2017-01-07 DIAGNOSIS — R600 Localized edema: Secondary | ICD-10-CM | POA: Diagnosis not present

## 2017-01-07 DIAGNOSIS — N183 Chronic kidney disease, stage 3 unspecified: Secondary | ICD-10-CM

## 2017-01-07 DIAGNOSIS — Z23 Encounter for immunization: Secondary | ICD-10-CM | POA: Diagnosis not present

## 2017-01-07 DIAGNOSIS — I10 Essential (primary) hypertension: Secondary | ICD-10-CM

## 2017-01-07 LAB — POCT GLYCOSYLATED HEMOGLOBIN (HGB A1C): HEMOGLOBIN A1C: 5.7

## 2017-01-07 NOTE — Progress Notes (Signed)
Subjective:     Patient ID: [name] [name], female   DOB: [birth year], 83 y.o.   MRN: [id number]  HPI Patient has multiple chronic problems including history of obesity, type 2 diabetes, chronic kidney disease, hypothyroidism, atrial fibrillation, diastolic heart failure, COPD, Wegener's granulomatosis and seen today for routine follow-up. She continues to deal with stress of her husband who has advanced dementia. He gets up frequently at night and patient is not getting good uninterrupted sleep. She's not monitoring sugars regularly. Diabetes has been fairly well controlled.  She was placed on Lexapro for depression symptoms but had increased bad dreams and took her self off after couple weeks. PH Q-9 score today 5  She has history of intermittent peripheral edema. Did not take her furosemide this morning. Generally takes 20 mg once daily. Denies any increased orthopnea. She is sleeping with oxygen at night.  Past Medical History:  Diagnosis Date  . Arthritis    r tkr  . Breast cancer ([place])   . Chronic atrial fibrillation (HCC)    CHADS2-VASc=6.  on low dose Eliquis (corrected for age & renal fxn)  . CVA (cerebral vascular accident) ([place]) 2008   mini stroke.no residual  . DEPRESSION 05/16/2009  . Diabetes mellitus type II   . Heart murmur    stress test 2009.dr peter Martinique  . HYPERLIPIDEMIA 07/24/2008  . HYPERTENSION 07/24/2008  . HYPOTHYROIDISM 07/24/2008  . Intermittent vertigo   . Pneumonia   . Pulmonary hypertension ([place])    Mod PHTN on Echo 03/2016: 2/2 combination of Wegener's, HFPF & Afib.  . Skin abnormality    facial lesions .pt applying mupiracin to areas  . Vertigo   . Wegener's disease, pulmonary ([place]) 10/2012   Past Surgical History:  Procedure Laterality Date  . BREAST SURGERY  2007   Lumpectomy, XRT 2006.l breast  . CHOLECYSTECTOMY  1980  . EXPLORATORY LAPAROTOMY    . HEEL SPUR SURGERY Right   . JOINT REPLACEMENT     r knee  . KNEE SURGERY  2009   TKR  .  TRANSTHORACIC ECHOCARDIOGRAM  03/2016   Afib (no Diastolic Fxn). EF 55-60%. No RWMA. Mild Aortic dilation Mild MR. Severe LA dilation. Mod RA dilation.  Mod TR with Mod-Severe Pulmonary HTN - but normal RV function..  . VENTRAL HERNIA REPAIR N/A 07/01/2016   Procedure: REPAIR OF INCARCERATED INCISIONAL HERNIA ;  Surgeon: Stark Klein, MD;  Location: [facility];  Service: General;  Laterality: N/A;  . VIDEO ASSISTED THORACOSCOPY  04/22/2011   Procedure: VIDEO ASSISTED THORACOSCOPY;  Surgeon: Melrose Nakayama, MD;  Location: [facility];  Service: Thoracic;  Laterality: Left;  WITH BIOPSY    reports that she quit smoking about 34 years ago. Her smoking use included Cigarettes. She has a 6.00 pack-year smoking history. She has never used smokeless tobacco. She reports that she does not drink alcohol or use drugs. family history includes Arthritis in her mother; Breast cancer in her sister; Cancer in her sister; Coronary artery disease in her brother and father; Heart disease in her father; Lung cancer in her brother; Rheum arthritis in her mother. Allergies  Allergen Reactions  . Codeine Sulfate Other (See Comments)    REACTION: GI upset  . Sulfonamide Derivatives Other (See Comments)    REACTION: GI upset  . Vancomycin Other (See Comments)    Red man syndrome  . Atarax [Hydroxyzine] Nausea Only and Rash     Review of Systems  Constitutional: Positive for fatigue. Negative for chills  and fever.  Eyes: Negative for visual disturbance.  Respiratory: Negative for cough, chest tightness, shortness of breath and wheezing.   Cardiovascular: Positive for leg swelling. Negative for chest pain and palpitations.  Neurological: Negative for dizziness, seizures, syncope, weakness, light-headedness and headaches.       Objective:   Physical Exam  Constitutional: She appears well-developed and well-nourished.  Eyes: Pupils are equal, round, and reactive to light.  Neck: Neck supple. No JVD present. No  thyromegaly present.  Cardiovascular: Normal rate.  Exam reveals no gallop.   Pulmonary/Chest: Effort normal and breath sounds normal. No respiratory distress. She has no wheezes. She has no rales.  Musculoskeletal: She exhibits edema.  Neurological: She is alert.       Assessment:     #1 type 2 diabetes. A1c stable today 5.7%  #2 atrial fibrillation on chronic anticoagulation  #3 increased situational stress with husband's advanced dementia  #4 increased bilateral leg edema    Plan:     -Increased furosemide to 40 mg daily for 4-5 days and drop back to 20 mg daily -Flu vaccine already given. -Discussed additional resources for caring for husband -Continue current medications and we'll plan routine follow-up in 3 months  [name] MD [facility] at [facility]

## 2017-01-07 NOTE — Patient Instructions (Addendum)
Increase Lasix to two a day for 4-5 days and then decrease back to one daily Elevate legs frequently.

## 2017-01-12 ENCOUNTER — Other Ambulatory Visit: Payer: Self-pay | Admitting: Internal Medicine

## 2017-01-12 ENCOUNTER — Other Ambulatory Visit: Payer: Self-pay | Admitting: Family Medicine

## 2017-01-13 NOTE — Telephone Encounter (Signed)
Rx done.

## 2017-01-13 NOTE — Telephone Encounter (Signed)
Refill once.  Should only be taking prn for severe vertigo symptoms.

## 2017-01-13 NOTE — Telephone Encounter (Signed)
Last rx given on 11/15/2015 for #20 with no refills

## 2017-01-26 ENCOUNTER — Encounter: Payer: Self-pay | Admitting: Family Medicine

## 2017-01-26 ENCOUNTER — Ambulatory Visit (INDEPENDENT_AMBULATORY_CARE_PROVIDER_SITE_OTHER): Payer: Medicare Other | Admitting: Family Medicine

## 2017-01-26 VITALS — BP 150/90 | HR 63 | Temp 98.7°F | Wt 152.6 lb

## 2017-01-26 DIAGNOSIS — J441 Chronic obstructive pulmonary disease with (acute) exacerbation: Secondary | ICD-10-CM

## 2017-01-26 DIAGNOSIS — E1121 Type 2 diabetes mellitus with diabetic nephropathy: Secondary | ICD-10-CM

## 2017-01-26 MED ORDER — PREDNISONE 10 MG PO TABS: ORAL_TABLET | ORAL | 0 refills | Status: DC

## 2017-01-26 MED ORDER — LEVOFLOXACIN 500 MG PO TABS: 500.0000 mg | ORAL_TABLET | Freq: Every day | ORAL | 0 refills | Status: DC

## 2017-01-26 NOTE — Patient Instructions (Addendum)
Follow up for fever or increased shortness of breath. Increase furosemide to two daily for 3-4 days and then drop back to one daily.

## 2017-01-26 NOTE — Progress Notes (Signed)
Subjective:     Patient ID: [name] [name], female   DOB: 10-30-1932, 83 y.o.   MRN: [id number]  HPI Patient seen with cough, sore throat, sinus congestion and increased malaise over the past several days. Over the past couple days she started coughing up thick yellow sputum tinged with blood. She's not aware of any fever. She has home oxygen which she uses intermittently.  She feels that she's gotten very run down with trying to carry out several duties at home -especially taking care of her husband who has dementia and doing several household chores.  Denies any dyspnea at rest and no pleuritic pain. No chest pain. She is currently taking low-dose prednisone 5 mg every other day.  Past Medical History:  Diagnosis Date  . Arthritis    r tkr  . Breast cancer ([place])   . Chronic atrial fibrillation (HCC)    CHADS2-VASc=6.  on low dose Eliquis (corrected for age & renal fxn)  . CVA (cerebral vascular accident) ([place]) 2008   mini stroke.no residual  . DEPRESSION 05/16/2009  . Diabetes mellitus type II   . Heart murmur    stress test 2009.dr peter Martinique  . HYPERLIPIDEMIA 07/24/2008  . HYPERTENSION 07/24/2008  . HYPOTHYROIDISM 07/24/2008  . Intermittent vertigo   . Pneumonia   . Pulmonary hypertension ([place])    Mod PHTN on Echo 03/2016: 2/2 combination of Wegener's, HFPF & Afib.  . Skin abnormality    facial lesions .pt applying mupiracin to areas  . Vertigo   . Wegener's disease, pulmonary ([place]) 10/2012   Past Surgical History:  Procedure Laterality Date  . BREAST SURGERY  2007   Lumpectomy, XRT 2006.l breast  . CHOLECYSTECTOMY  1980  . EXPLORATORY LAPAROTOMY    . HEEL SPUR SURGERY Right   . JOINT REPLACEMENT     r knee  . KNEE SURGERY  2009   TKR  . TRANSTHORACIC ECHOCARDIOGRAM  03/2016   Afib (no Diastolic Fxn). EF 55-60%. No RWMA. Mild Aortic dilation Mild MR. Severe LA dilation. Mod RA dilation.  Mod TR with Mod-Severe Pulmonary HTN - but normal RV function..  . VENTRAL HERNIA  REPAIR N/A 07/01/2016   Procedure: REPAIR OF INCARCERATED INCISIONAL HERNIA ;  Surgeon: Stark Klein, MD;  Location: [facility];  Service: General;  Laterality: N/A;  . VIDEO ASSISTED THORACOSCOPY  04/22/2011   Procedure: VIDEO ASSISTED THORACOSCOPY;  Surgeon: Melrose Nakayama, MD;  Location: [facility];  Service: Thoracic;  Laterality: Left;  WITH BIOPSY    reports that she quit smoking about 34 years ago. Her smoking use included Cigarettes. She has a 6.00 pack-year smoking history. She has never used smokeless tobacco. She reports that she does not drink alcohol or use drugs. family history includes Arthritis in her mother; Breast cancer in her sister; Cancer in her sister; Coronary artery disease in her brother and father; Heart disease in her father; Lung cancer in her brother; Rheum arthritis in her mother. Allergies  Allergen Reactions  . Codeine Sulfate Other (See Comments)    REACTION: GI upset  . Sulfonamide Derivatives Other (See Comments)    REACTION: GI upset  . Vancomycin Other (See Comments)    Red man syndrome  . Atarax [Hydroxyzine] Nausea Only and Rash     Review of Systems  Constitutional: Positive for fatigue. Negative for chills and fever.  HENT: Positive for congestion and sinus pressure.   Respiratory: Positive for cough. Negative for shortness of breath.   Cardiovascular: Positive for  leg swelling. Negative for chest pain and palpitations.  Genitourinary: Negative for dysuria.  Neurological: Negative for dizziness.  Psychiatric/Behavioral: Negative for confusion.       Objective:   Physical Exam  Constitutional: She appears well-developed and well-nourished.  HENT:  Right Ear: External ear normal.  Left Ear: External ear normal.  Mouth/Throat: Oropharynx is clear and moist.  Neck: Neck supple.  Cardiovascular: Normal rate and regular rhythm.   Pulmonary/Chest:  Patient has some crackles in her right anterior upper airway and diffuse wheezes. Pulse oximetry 93%  room air. Normal respiratory rate.  Musculoskeletal: She exhibits edema.       Assessment:     #1 Probable acute exacerbation of COPD. She has complex history with history of Wegener's granulomatosis, pulmonary hypertension, COPD. High risk for complication. Currently no respiratory distress. She is afebrile but increased productive cough past few days  #2 type 2 diabetes which has been well controlled     Plan:     -Continue home oxygen as needed -Start Levaquin 500 milligrams once daily for 7 days -Increase prednisone to 40 mg daily and then taper -Follow-up immediately for any fever or increased shortness of breath or other concern -Monitor blood sugars closely for exacerbation while on prednisone -CXR if no improvement next couple of days  [name] MD [facility] at [facility]

## 2017-02-02 ENCOUNTER — Ambulatory Visit (INDEPENDENT_AMBULATORY_CARE_PROVIDER_SITE_OTHER)
Admission: RE | Admit: 2017-02-02 | Discharge: 2017-02-02 | Disposition: A | Discharge status: Home / Self Care | Payer: Medicare Other | Source: Ambulatory Visit | Attending: Internal Medicine | Admitting: Internal Medicine

## 2017-02-02 ENCOUNTER — Encounter: Payer: Self-pay | Admitting: Internal Medicine

## 2017-02-02 ENCOUNTER — Ambulatory Visit (INDEPENDENT_AMBULATORY_CARE_PROVIDER_SITE_OTHER): Payer: Medicare Other | Admitting: Internal Medicine

## 2017-02-02 VITALS — BP 122/76 | HR 55 | Ht 66.0 in | Wt 150.4 lb

## 2017-02-02 DIAGNOSIS — J9621 Acute and chronic respiratory failure with hypoxia: Secondary | ICD-10-CM | POA: Diagnosis not present

## 2017-02-02 DIAGNOSIS — I482 Chronic atrial fibrillation, unspecified: Secondary | ICD-10-CM

## 2017-02-02 DIAGNOSIS — I509 Heart failure, unspecified: Secondary | ICD-10-CM | POA: Diagnosis not present

## 2017-02-02 DIAGNOSIS — M3130 Wegener's granulomatosis without renal involvement: Secondary | ICD-10-CM | POA: Diagnosis not present

## 2017-02-02 DIAGNOSIS — J449 Chronic obstructive pulmonary disease, unspecified: Secondary | ICD-10-CM

## 2017-02-02 NOTE — Assessment & Plan Note (Signed)
She remains now on maintenance prednisone 5 mg daily with presumed adrenal insufficiency now.

## 2017-02-02 NOTE — Progress Notes (Signed)
HPI F former smoker followed for bilateral pulmonary infiltrates/nodules, increased see-Anka, status post VATS biopsy/granulomatous polyangiitis (Wegener's) versus cryptogenic organizing pneumonia, complicated by metastatic intestinal carcinoid, history breast CVA 2006, A. fib/PE, HTN  -------------------------------------------------------------------------------------------  08/04/16- 83 year old female former smoker followed for bilateral pulmonary infiltrates/nodules, increased C-ANCA, status post VATS biopsy/granulomatous polyangiitis (Wegener's) versus cryptogenic organizing pneumonia/, metastatic intestinal carcinoid, complicated by history breast CA 2006, A. fib/PE HTN/,, DM O2 2 L [facility] 3[facility] for incarcerated abdominal wall hernia Continues maintenance prednisone 5 mg daily with little respiratory problem since starting that program. FOLLOWS FEO:[id number] stating that she is feeling very well. patient is SOB only when moving around to much.  Abdomen is still tender after abdominal hernia repair without mesh, but it is not disturbing her sleep much. She came with her son today. Husband progressively more impaired by Alzheimer's. Breathing has been very comfortable with little cough. She is not active enough to notice exertional dyspnea. No acute chest symptoms. Continues oxygen all night every night for sleep. CXR 01/24/16-IMPRESSION: No active cardiopulmonary disease.  02/02/17- 83 year old female former smoker followed for bilateral pulmonary infiltrates/nodules, increased C-ANCA, status post VATS biopsy/granulomatous polyangiitis (Wegener's) versus cryptogenic organizing pneumonia/, metastatic intestinal carcinoid, nocturnal hypoxemia, complicated by history breast CA 2006, A. fib/PE HTN/,, DM O2 2 L [facility] Resp Failure with Hypoxia; DME [facility]. Pt wears O2 QHS and paces herself during the day. Prednisone 5 mg daily She has lost some weight, saying  appetite not as good. Admits stress and anxiety which she has discussed with her PCP. She takes half a Valium tablet occasionally if really needed. She tries to do her own cooking and housekeeping and care for her husband who has Alzheimer's. Says she sleeps better with oxygen. Hospitalized with pneumonia in June but says that has resolved. CXR 09/29/16 IMPRESSION: Bilateral density most severe in the left lower lobe most consistent with bronchopneumonia. There could also be an element of fluid overload/ edema.  ROS-see HPI    + = pos Constitutional:   +weight loss, night sweats, fevers, chills, +fatigue, lassitude. HEENT:   No-  headaches, difficulty swallowing, tooth/dental problems, sore throat,       No-  sneezing, itching, ear ache, nasal congestion, post nasal drip,  CV: No -chest pain, orthopnea, PND, swelling in lower extremities, anasarca,    No-palpitations Resp:   shortness of breath with exertion or at rest.           No- productive cough,  No-non-productive cough,               No-   change in color of mucus.  No- wheezing.   Skin: No-   rash or lesions. no- pruritus GI:  No-   heartburn, indigestion, abdominal pain, nausea, vomiting,  GU: MS:  No-   joint pain or swelling.  Neuro-     +Vertigo- uses wheelchair Psych:  No- change in mood or affect. No depression or anxiety.  No memory loss.  OBJ General- Alert, Oriented, Affect-appropriate, Distress- none acute,  Lost weight, +wheelchair     Skin- rash-none, lesions- none, excoriation- none Lymphadenopathy- none Head- atraumatic            Eyes- Gross vision intact, PERRLA, conjunctivae -pale            Ears- Hearing, canals-normal            Nose- turbinate edema, no-Septal dev, mucus, polyps, erosion, perforation  Throat- Mallampati II-III , mucosa clear , drainage- none, tonsils- atrophic Neck- flexible , trachea midline, no stridor , thyroid nl, carotid no bruit Chest - symmetrical excursion , unlabored, +  crackles to mid-back           Heart/CV-+ slow irregularly irregular/ AFib , 2-3/6 precordial systolic murmur , no gallop  , no rub, Nl s1 s2                 - JVD+full , edema-none, stasis changes- none, varices- none           Lung- + bilateral squeaks and a few crackles, no wheeze , cough-none , dullness-none, rub- none           Chest wall- healed VATS thoracotomy incision L Abd-   Br/ Gen/ Rectal- Not done, not indicated Extrem- cyanosis- none, clubbing, none, atrophy- none, strength- nl. + Wheelchair for distance/ cane at home.  Neuro- grossly intact to observation

## 2017-02-02 NOTE — Assessment & Plan Note (Signed)
VRR today 55. Maybe a little slow, given her sense of weakness ? Managed by cardiology

## 2017-02-02 NOTE — Patient Instructions (Signed)
Order CXR  Dx hx Wegener's Granulomatosis  Ok to continue prednisone 5 mg daily  Ok to continue O2 2L for sleep  Please call if you need [place]

## 2017-02-02 NOTE — Assessment & Plan Note (Signed)
Appropriate to continue oxygen for sleep

## 2017-02-02 NOTE — Assessment & Plan Note (Signed)
I hear a few crackles today which are nonspecific with uncertain relationship to the pneumonia she had in June. Plan-CXR

## 2017-02-16 ENCOUNTER — Encounter: Payer: Self-pay | Admitting: Cardiology

## 2017-02-26 NOTE — Progress Notes (Signed)
[address] [place], [place]  [address] Phone: [phone] Fax:  [phone]  Date:  03/02/2017   ID:  [name] [name], DOB 1932/04/17, MRN [id number]  PCP:  Kristian Covey, MD    History of Present Illness: [name] [name] is a 83 y.o. female with a hx of HTN, DM2, prior TIA, HL, breast CA, atrial fibrillation.  She is on Eliquis (CHADS2-VASc=6) for anticoagulation. She has a  dx of granulomatosis with polyangiitis (Wegener's).   Echo 04/14/13  EF 60-65%, trivial AI, mild-mod MR, mod to severe LAE, mild RVE, mod RAE, moderate TR,  PASP- 60 mm Hg.     In September 2017 she was admitted with diabetic foot ulcer with cellulitis.  In December 2017 she was admitted with CAP. She was also felt to have diastolic CHF with BNP elevated to 1332. Was started on lasix 20 mg daily. EF was normal by Echo. Moderate pulmonary HTN.  In March 2018 she was admitted with an incarcerated umbilical hernia. She underwent surgical repair without cardiac complications.   Readmitted in June 2018 with LLL PNA and respiratory failure- improved with antibiotics.   On follow up today she is doing well from a cardiac standpoint but is under a lot of stress at home. Her husband has Alzheimer's dementia and she is caring for him with help from her son. Her sister has recurrent pancreatic CA. She reports her breathing is doing well. She wears oxygen at night. Only takes lasix as needed for swelling. Ends up taking it about once a week. Reports sugars are good and she is no longer taking amaryl. No bleeding on Eliquis. She has some chronic vertigo and uses a wheelchair. No chest pain. No syncope.   Wt Readings from Last 3 Encounters:  03/02/17 151 lb 6.4 oz (68.7 kg)  02/02/17 150 lb 6.4 oz (68.2 kg)  01/26/17 152 lb 9.6 oz (69.2 kg)     Past Medical History:  Diagnosis Date  . Arthritis    r tkr  . Breast cancer (HCC)   . Chronic atrial fibrillation (HCC)    CHADS2-VASc=6.  on low dose Eliquis  (corrected for age & renal fxn)  . CVA (cerebral vascular accident) (HCC) 2008   mini stroke.no residual  . DEPRESSION 05/16/2009  . Diabetes mellitus type II   . Heart murmur    stress test 2009.dr Anmol Paschen [place]  . HYPERLIPIDEMIA 07/24/2008  . HYPERTENSION 07/24/2008  . HYPOTHYROIDISM 07/24/2008  . Intermittent vertigo   . Pneumonia   . Pulmonary hypertension (HCC)    Mod PHTN on Echo 03/2016: 2/2 combination of Wegener's, HFPF & Afib.  . Skin abnormality    facial lesions .pt applying mupiracin to areas  . Vertigo   . Wegener's disease, pulmonary (HCC) 10/2012    Current Outpatient Medications  Medication Sig Dispense Refill  . acetaminophen (TYLENOL) 500 MG tablet Take 500 mg by mouth every 6 (six) hours as needed for headache.    Marland Kitchen amLODipine (NORVASC) 5 MG tablet TAKE ONE TABLET BY MOUTH ONCE DAILY. 90 tablet 2  . BYSTOLIC 10 MG tablet TAKE ONE TABLET BY MOUTH ONCE DAILY  AFTER BREAKFAST 90 tablet 1  . diazepam (VALIUM) 5 MG tablet TAKE ONE TABLET BY MOUTH EVERY 12 HOURS AS NEEDED FOR SEVERE  VERTIGO  FLARES.  AVOID  REGULAR  USE. 20 tablet 0  . ELIQUIS 2.5 MG TABS tablet TAKE 1 TABLET BY MOUTH TWICE DAILY 60 tablet 5  . furosemide (  LASIX) 20 MG tablet Take 1 tablet (20 mg total) by mouth daily. 90 tablet 2  . gabapentin (NEURONTIN) 100 MG capsule TAKE ONE CAPSULE BY MOUTH TWICE DAILY BEFORE  A  MEAL 60 capsule 5  . glimepiride (AMARYL) 2 MG tablet TAKE ONE TABLET BY MOUTH ONCE DAILY BEFORE BREAKFAST 90 tablet 1  . guaiFENesin (MUCINEX) 600 MG 12 hr tablet Take 600 mg by mouth 2 (two) times daily as needed for cough.    . levothyroxine (SYNTHROID, LEVOTHROID) 100 MCG tablet TAKE ONE TABLET BY MOUTH ONCE DAILY BEFORE BREAKFAST 90 tablet 2  . predniSONE (DELTASONE) 5 MG tablet TAKE 1 TABLET BY MOUTH ONCE DAILY WITH BREAKFAST 30 tablet 0  . triamcinolone cream (KENALOG) 0.1 % Apply 1 application topically 2 (two) times daily as needed. 453 g 1   No current facility-administered  medications for this visit.     Allergies:    Allergies  Allergen Reactions  . Codeine Sulfate Other (See Comments)    REACTION: GI upset  . Sulfonamide Derivatives Other (See Comments)    REACTION: GI upset  . Vancomycin Other (See Comments)    Red man syndrome  . Atarax [Hydroxyzine] Nausea Only and Rash    Social History:  The patient  reports that she quit smoking about 34 years ago. Her smoking use included cigarettes. She has a 6.00 pack-year smoking history. she has never used smokeless tobacco. She reports that she does not drink alcohol or use drugs.   ROS:  Please see the history of present illness.   No hemoptysis.   All other systems reviewed and negative.   PHYSICAL EXAM: VS:  BP (!) 118/53   Pulse 68   Ht 5\' 6"  (1.676 m)   Wt 151 lb 6.4 oz (68.7 kg)   SpO2 97%   BMI 24.44 kg/m  GENERAL:  Well appearing, elderly WF seen in a wheelchair.  HEENT:  PERRL, EOMI, sclera are clear. Oropharynx is clear. NECK:  No jugular venous distention, carotid upstroke brisk and symmetric, no bruits, no thyromegaly or adenopathy LUNGS:  Few scattered inspiratory squeeks.  CHEST:  Unremarkable HEART:  IRRR,  PMI not displaced or sustained,S1 and S2 within normal limits, no S3, no S4: no clicks, no rubs, gr 1-2/6 apical systolic murmur. ABD:  Soft, nontender. BS +, no masses or bruits. No hepatomegaly, no splenomegaly EXT:  2 + pulses throughout, no edema, no cyanosis no clubbing SKIN:  Warm and dry.  No rashes NEURO:  Alert and oriented x 3. Cranial nerves II through XII intact. PSYCH:  Cognitively intact    Laboratory data:  Lab Results  Component Value Date   WBC 9.2 10/07/2016   HGB 8.7 (L) 10/07/2016   HCT 27.0 (L) 10/07/2016   PLT 407 (H) 10/07/2016   GLUCOSE 96 10/07/2016   CHOL 132 11/30/2015   TRIG 100.0 11/30/2015   HDL 60.00 11/30/2015   LDLDIRECT 75.3 10/21/2011   LDLCALC 52 11/30/2015   ALT 9 (L) 09/29/2016   AST 20 09/29/2016   NA 133 (L) 10/07/2016   K  4.6 10/07/2016   CL 99 10/07/2016   CREATININE 1.61 (H) 10/07/2016   BUN 20 10/07/2016   CO2 25 10/07/2016   TSH 3.286 09/29/2016   INR 2.12 09/29/2016   HGBA1C 5.7 01/07/2017   MICROALBUR 1.7 08/15/2009   Echo: 04/04/16: Study Conclusions  - Left ventricle: The cavity size was normal. Wall thickness was   increased in a pattern of mild LVH. Systolic  function was normal.   The estimated ejection fraction was in the range of 55% to 60%.   Indeterminant diastolic function (atrial fibrillation). Wall   motion was normal; there were no regional wall motion   abnormalities. - Aortic valve: There was no stenosis. There was trivial   regurgitation. - Aorta: Mildly dilated ascending aorta at 3.7 cm. - Mitral valve: Mildly calcified annulus. There was mild   regurgitation. - Left atrium: The atrium was severely dilated. - Right ventricle: The cavity size was normal. Systolic function   was normal. - Right atrium: Prominent Chiari network. The atrium was mildly to   moderately dilated. - Tricuspid valve: There was moderate regurgitation. Peak RV-RA   gradient (S): 54 mm Hg. - Pulmonary arteries: PA peak pressure: 62 mm Hg (S). - Systemic veins: IVC measured 2.3 cm with > 50% respirophasic   variation, suggesting RA pressure 8 mmHg. - Pericardium, extracardiac: A trivial pericardial effusion was   identified.  Impressions:  - The patient was in atrial fibrillation. Normal LV size with mild   LV hypertrophy. EF 55-60%. Normal RV size and systolic function.   Biatrial enlargement. Moderate tricuspid regurgitation, mild   mitral regurgitation. Moderate pulmonary hypertension.   ASSESSMENT AND PLAN:  1. Atrial Fibrillation:  Rate is well controlled on bystolic.    She is on the correct dose of Eliquis for her (age > 80 and weight > 60 kg).  CHADS2-VASc=6.  She is asymptomatic. Continue current therapy.  2. Pulmonary HTN:  Likely related to sequelae of Wegener's granulomatosis +/-  CHF. 3. Hypertension:  BP well controlled.   4. Granulomatosis with Polyangiitis (Wegener's):  Continue follow up with pulmonary and oncology as planned. On chronic steroids. 5. Chronic diastolic dysfunction. Well compensated and euvolemic. Continue lasix as needed.  I will follow up in 6 months.

## 2017-03-02 ENCOUNTER — Ambulatory Visit (INDEPENDENT_AMBULATORY_CARE_PROVIDER_SITE_OTHER): Payer: Medicare Other | Admitting: Cardiology

## 2017-03-02 ENCOUNTER — Encounter: Payer: Self-pay | Admitting: Cardiology

## 2017-03-02 VITALS — BP 118/53 | HR 68 | Ht 66.0 in | Wt 151.4 lb

## 2017-03-02 DIAGNOSIS — I10 Essential (primary) hypertension: Secondary | ICD-10-CM | POA: Diagnosis not present

## 2017-03-02 DIAGNOSIS — I482 Chronic atrial fibrillation, unspecified: Secondary | ICD-10-CM

## 2017-03-02 DIAGNOSIS — I5032 Chronic diastolic (congestive) heart failure: Secondary | ICD-10-CM

## 2017-03-02 DIAGNOSIS — I272 Pulmonary hypertension, unspecified: Secondary | ICD-10-CM | POA: Diagnosis not present

## 2017-03-02 NOTE — Patient Instructions (Signed)
Continue your current therapy  I will see you in 6 months.

## 2017-03-04 ENCOUNTER — Telehealth: Payer: Self-pay | Admitting: Cardiology

## 2017-03-04 NOTE — Telephone Encounter (Signed)
Returned the call to the patient. She stated that she would like to speak with Malachy Mood, Dr. Doug Sou nurse, about her Eliquis and assistance. Will route to her for her knowledge.

## 2017-03-04 NOTE — Telephone Encounter (Signed)
New message  Pt verbalized that she is calling for the rn   She would not give a reason

## 2017-03-06 NOTE — Telephone Encounter (Signed)
Returned call to patient 03/05/17.She stated insurance will no longer pay for Eliquis.Stated she will need a letter stating Eliquis is medically necessary to fax to insurance.

## 2017-03-09 ENCOUNTER — Other Ambulatory Visit: Payer: Self-pay | Admitting: Family Medicine

## 2017-03-09 ENCOUNTER — Other Ambulatory Visit: Payer: Self-pay | Admitting: Internal Medicine

## 2017-03-18 ENCOUNTER — Other Ambulatory Visit: Payer: Self-pay | Admitting: Family Medicine

## 2017-03-20 NOTE — Telephone Encounter (Signed)
Tier exception for Eliquis sent to cover my meds.

## 2017-04-27 ENCOUNTER — Other Ambulatory Visit: Payer: Self-pay | Admitting: Internal Medicine

## 2017-06-09 ENCOUNTER — Other Ambulatory Visit: Payer: Self-pay | Admitting: Internal Medicine

## 2017-07-10 ENCOUNTER — Other Ambulatory Visit: Payer: Self-pay | Admitting: Family Medicine

## 2017-07-13 NOTE — Telephone Encounter (Signed)
Pt called to check status of Bystolic refill. She called pharmacy 07/05/17 and they advised it had been sent multiple times and calls made to office. Pt is now out of medication.  [place], [place] - [address] [phone] (Phone) [phone] (Fax)

## 2017-07-27 ENCOUNTER — Other Ambulatory Visit: Payer: Self-pay | Admitting: Family Medicine

## 2017-08-03 ENCOUNTER — Ambulatory Visit: Payer: Medicare Other | Admitting: Internal Medicine

## 2017-08-03 ENCOUNTER — Telehealth: Payer: Self-pay | Admitting: Cardiology

## 2017-08-03 NOTE — Telephone Encounter (Signed)
Returned call to patient Eliquis 2.5 mg samples left at [employer] office front desk.

## 2017-08-03 NOTE — Telephone Encounter (Signed)
Patient calling the office for samples of medication:   1.  What medication and dosage are you requesting samples for?  ELIQUIS 2.5 MG TABS tablet TAKE 1 TABLET BY MOUTH TWICE DAILY   2.  Are you currently out of this medication? 2 days left

## 2017-08-08 ENCOUNTER — Other Ambulatory Visit: Payer: Self-pay | Admitting: Internal Medicine

## 2017-08-26 ENCOUNTER — Other Ambulatory Visit: Payer: Self-pay | Admitting: Family Medicine

## 2017-08-31 NOTE — Progress Notes (Signed)
[address] [place], [place]  [address] Phone: [phone] Fax:  [phone]  Date:  09/04/2017   ID:  [name] [name], DOB 08-17-1932, MRN [id number]  PCP:  Kristian Covey, MD    History of Present Illness: [name] [name] is a 84 y.o. female with a hx of HTN, DM2, prior TIA, HL, breast CA, atrial fibrillation.  She is on Eliquis (CHADS2-VASc=6) for anticoagulation. She has a  dx of granulomatosis with polyangiitis (Wegener's).   Echo 04/14/13  EF 60-65%, trivial AI, mild-mod MR, mod to severe LAE, mild RVE, mod RAE, moderate TR,  PASP- 60 mm Hg.     In September 2017 she was admitted with diabetic foot ulcer with cellulitis.  In December 2017 she was admitted with CAP. She was also felt to have diastolic CHF with BNP elevated to 1332. Was started on lasix 20 mg daily. EF was normal by Echo. Moderate pulmonary HTN.  In March 2018 she was admitted with an incarcerated umbilical hernia. She underwent surgical repair without cardiac complications.   Readmitted in June 2018 with LLL PNA and respiratory failure- improved with antibiotics.   On follow up today she is doing well. She has chronic imbalance so she doesn't get out of the house much. Denies any dyspnea, palpitations, chest pain or edema. No bleeding other than minor bruising.   Wt Readings from Last 3 Encounters:  09/04/17 149 lb 12.8 oz (67.9 kg)  09/03/17 149 lb (67.6 kg)  03/02/17 151 lb 6.4 oz (68.7 kg)     Past Medical History:  Diagnosis Date  . Arthritis    r tkr  . Breast cancer (HCC)   . Chronic atrial fibrillation (HCC)    CHADS2-VASc=6.  on low dose Eliquis (corrected for age & renal fxn)  . CVA (cerebral vascular accident) (HCC) 2008   mini stroke.no residual  . DEPRESSION 05/16/2009  . Diabetes mellitus type II   . Heart murmur    stress test 2009.dr Jaidin Ugarte [place]  . HYPERLIPIDEMIA 07/24/2008  . HYPERTENSION 07/24/2008  . HYPOTHYROIDISM 07/24/2008  . Intermittent vertigo   . Pneumonia   .  Pulmonary hypertension (HCC)    Mod PHTN on Echo 03/2016: 2/2 combination of Wegener's, HFPF & Afib.  . Skin abnormality    facial lesions .pt applying mupiracin to areas  . Vertigo   . Wegener's disease, pulmonary (HCC) 10/2012    Current Outpatient Medications  Medication Sig Dispense Refill  . acetaminophen (TYLENOL) 500 MG tablet Take 500 mg by mouth every 6 (six) hours as needed for headache.    Marland Kitchen amLODipine (NORVASC) 5 MG tablet TAKE ONE TABLET BY MOUTH ONCE DAILY 90 tablet 2  . BYSTOLIC 10 MG tablet TAKE 1 TABLET BY MOUTH ONCE DAILY AFTER BREAKFAST 90 tablet 1  . diazepam (VALIUM) 5 MG tablet TAKE ONE TABLET BY MOUTH EVERY 12 HOURS AS NEEDED FOR SEVERE  VERTIGO  FLARES.  AVOID  REGULAR  USE. 20 tablet 0  . ELIQUIS 2.5 MG TABS tablet TAKE 1 TABLET BY MOUTH TWICE DAILY 60 tablet 5  . furosemide (LASIX) 20 MG tablet Take 1 tablet (20 mg total) by mouth daily. 90 tablet 2  . gabapentin (NEURONTIN) 100 MG capsule TAKE 1 CAPSULE BY MOUTH TWICE DAILY BEFORE MEAL(S) 60 capsule 5  . glimepiride (AMARYL) 2 MG tablet TAKE ONE TABLET BY MOUTH ONCE DAILY BEFORE BREAKFAST 90 tablet 1  . guaiFENesin (MUCINEX) 600 MG 12 hr tablet Take 600 mg by  mouth 2 (two) times daily as needed for cough.    . levothyroxine (SYNTHROID, LEVOTHROID) 100 MCG tablet TAKE 1 TABLET BY MOUTH ONCE DAILY BEFORE BREAKFAST. 90 tablet 2  . predniSONE (DELTASONE) 5 MG tablet TAKE 1 TABLET BY MOUTH ONCE DAILY WITH  BREAKFAST 30 tablet 0   No current facility-administered medications for this visit.     Allergies:    Allergies  Allergen Reactions  . Codeine Sulfate Other (See Comments)    REACTION: GI upset  . Sulfonamide Derivatives Other (See Comments)    REACTION: GI upset  . Vancomycin Other (See Comments)    Red man syndrome  . Atarax [Hydroxyzine] Nausea Only and Rash    Social History:  The patient  reports that she quit smoking about 35 years ago. Her smoking use included cigarettes. She has a 6.00 pack-year  smoking history. She has never used smokeless tobacco. She reports that she does not drink alcohol or use drugs.   ROS:  Please see the history of present illness.   No hemoptysis.   All other systems reviewed and negative.   PHYSICAL EXAM: VS:  BP 132/74   Pulse 65   Ht 5\' 6"  (1.676 m)   Wt 149 lb 12.8 oz (67.9 kg)   BMI 24.18 kg/m  GENERAL:  Well appearing, elderly WF seen in wheelchair.  HEENT:  PERRL, EOMI, sclera are clear. Oropharynx is clear. NECK:  No jugular venous distention, carotid upstroke brisk and symmetric, no bruits, no thyromegaly or adenopathy LUNGS:  Few squeeks, otherwise clear. CHEST:  Unremarkable HEART:  IRRR,  PMI not displaced or sustained,S1 and S2 within normal limits, no S3, no S4: no clicks, no rubs, 2/6 apical systolic murmur. ABD:  Soft, nontender. BS +, no masses or bruits. No hepatomegaly, no splenomegaly EXT:  2 + pulses throughout, no edema, no cyanosis no clubbing SKIN:  Warm and dry.  No rashes NEURO:  Alert and oriented x 3. Cranial nerves II through XII intact. PSYCH:  Cognitively intact      Laboratory data:  Lab Results  Component Value Date   WBC 9.2 10/07/2016   HGB 8.7 (L) 10/07/2016   HCT 27.0 (L) 10/07/2016   PLT 407 (H) 10/07/2016   GLUCOSE 96 10/07/2016   CHOL 132 11/30/2015   TRIG 100.0 11/30/2015   HDL 60.00 11/30/2015   LDLDIRECT 75.3 10/21/2011   LDLCALC 52 11/30/2015   ALT 9 (L) 09/29/2016   AST 20 09/29/2016   NA 133 (L) 10/07/2016   K 4.6 10/07/2016   CL 99 10/07/2016   CREATININE 1.61 (H) 10/07/2016   BUN 20 10/07/2016   CO2 25 10/07/2016   TSH 3.286 09/29/2016   INR 2.12 09/29/2016   HGBA1C 5.7 01/07/2017   MICROALBUR 1.7 08/15/2009    Ecg today shows Afib with incomplete RBBB. Rate 65. I have personally reviewed and interpreted this study.  Echo: 04/04/16: Study Conclusions  - Left ventricle: The cavity size was normal. Wall thickness was   increased in a pattern of mild LVH. Systolic function was  normal.   The estimated ejection fraction was in the range of 55% to 60%.   Indeterminant diastolic function (atrial fibrillation). Wall   motion was normal; there were no regional wall motion   abnormalities. - Aortic valve: There was no stenosis. There was trivial   regurgitation. - Aorta: Mildly dilated ascending aorta at 3.7 cm. - Mitral valve: Mildly calcified annulus. There was mild   regurgitation. - Left atrium:  The atrium was severely dilated. - Right ventricle: The cavity size was normal. Systolic function   was normal. - Right atrium: Prominent Chiari network. The atrium was mildly to   moderately dilated. - Tricuspid valve: There was moderate regurgitation. Peak RV-RA   gradient (S): 54 mm Hg. - Pulmonary arteries: PA peak pressure: 62 mm Hg (S). - Systemic veins: IVC measured 2.3 cm with > 50% respirophasic   variation, suggesting RA pressure 8 mmHg. - Pericardium, extracardiac: A trivial pericardial effusion was   identified.  Impressions:  - The patient was in atrial fibrillation. Normal LV size with mild   LV hypertrophy. EF 55-60%. Normal RV size and systolic function.   Biatrial enlargement. Moderate tricuspid regurgitation, mild   mitral regurgitation. Moderate pulmonary hypertension.   ASSESSMENT AND PLAN:  1. Atrial Fibrillation:  Rate is well controlled on bystolic.    She is on the correct dose of Eliquis for her (age > 80 and weight > 60 kg).  CHADS2-VASc=6.  She is asymptomatic. Continue current therapy.  2. Pulmonary HTN:  Likely related to sequelae of Wegener's granulomatosis +/- CHF. No significant dyspnea 3. Hypertension:  BP well controlled.   4. Granulomatosis with Polyangiitis (Wegener's):  Continue follow up with pulmonary and oncology as planned. On chronic steroids. 5. Chronic diastolic dysfunction. Well compensated and euvolemic. Continue lasix as needed.  I will follow up in 6 months.

## 2017-09-03 ENCOUNTER — Encounter: Payer: Self-pay | Admitting: Internal Medicine

## 2017-09-03 ENCOUNTER — Ambulatory Visit (INDEPENDENT_AMBULATORY_CARE_PROVIDER_SITE_OTHER): Payer: Medicare Other | Admitting: Internal Medicine

## 2017-09-03 DIAGNOSIS — J9621 Acute and chronic respiratory failure with hypoxia: Secondary | ICD-10-CM | POA: Diagnosis not present

## 2017-09-03 DIAGNOSIS — I50812 Chronic right heart failure: Secondary | ICD-10-CM | POA: Diagnosis not present

## 2017-09-03 DIAGNOSIS — M3130 Wegener's granulomatosis without renal involvement: Secondary | ICD-10-CM | POA: Diagnosis not present

## 2017-09-03 NOTE — Patient Instructions (Signed)
  We will continue oxygen at night, and prednisone 5 mg daily  Please call if we can help

## 2017-09-03 NOTE — Progress Notes (Signed)
HPI F former smoker followed for bilateral pulmonary infiltrates/nodules, increased see-Anka, status post VATS biopsy/granulomatous polyangiitis (Wegener's) versus cryptogenic organizing pneumonia, complicated by metastatic intestinal carcinoid, history breast CVA 2006, A. fib/PE, HTN  -------------------------------------------------------------------------------------------  02/02/17- 84 year old female former smoker followed for bilateral pulmonary infiltrates/nodules, increased C-ANCA, status post VATS biopsy/granulomatous polyangiitis (Wegener's) versus cryptogenic organizing pneumonia/, metastatic intestinal carcinoid, nocturnal hypoxemia, complicated by history breast CA 2006, A. fib/PE HTN/,, DM O2 2 L Advanced- sleep Chronic Resp Failure with Hypoxia; DME [facility]. Pt wears O2 QHS and paces herself during the day. Prednisone 5 mg daily She has lost some weight, saying appetite not as good. Admits stress and anxiety which she has discussed with her PCP. She takes half a Valium tablet occasionally if really needed. She tries to do her own cooking and housekeeping and care for her husband who has Alzheimer's. Says she sleeps better with oxygen. Hospitalized with pneumonia in June but says that has resolved. CXR 09/29/16 IMPRESSION: Bilateral density most severe in the left lower lobe most consistent with bronchopneumonia. There could also be an element of fluid overload/ edema.  09/03/2017- 84 year old female former smoker followed for bilateral pulmonary infiltrates/nodules, increased C-ANCA, status post VATS biopsy/granulomatous polyangiitis (Wegener's) versus cryptogenic organizing pneumonia/, metastatic intestinal carcinoid, nocturnal hypoxemia, complicated by history breast CA 2006, A. fib/PE HTN/,, DM O2 2 L Advanced- sleep Continues prednisone 5 mg daily maintenance Much family stress- husband Alzheimers, sister pancreatic Ca. Feels her health is stable and breathing ok. Diuretic controls  edema. CXR 02/03/2017- IMPRESSION: CHF with mild pulmonary edema.  ROS-see HPI    + = positive Constitutional:   +weight loss, night sweats, fevers, chills, +fatigue, lassitude. HEENT:   No-  headaches, difficulty swallowing, tooth/dental problems, sore throat,       No-  sneezing, itching, ear ache, nasal congestion, post nasal drip,  CV: No -chest pain, orthopnea, PND, swelling in lower extremities, anasarca,    No-palpitations Resp:   shortness of breath with exertion or at rest.           No- productive cough,  No-non-productive cough,               No-   change in color of mucus.  No- wheezing.   Skin: No-   rash or lesions. no- pruritus GI:  No-   heartburn, indigestion, abdominal pain, nausea, vomiting,  GU: MS:  No-   joint pain or swelling.  Neuro-     +Vertigo- uses wheelchair Psych:  No- change in mood or affect. No depression or anxiety.  No memory loss.  OBJ General- Alert, Oriented, Affect-appropriate, Distress- none acute,  +Lost weight, +wheelchair     Skin- rash-none, lesions- none, excoriation- none Lymphadenopathy- none Head- atraumatic            Eyes- Gross vision intact, PERRLA, conjunctivae -pale            Ears- Hearing, canals-normal            Nose- turbinate edema, no-Septal dev, mucus, polyps, erosion, perforation             Throat- Mallampati II-III , mucosa clear , drainage- none, tonsils- atrophic Neck- flexible , trachea midline, no stridor , thyroid nl, carotid no bruit Chest - symmetrical excursion , unlabored, + crackles to mid-back           Heart/CV-+ slow irregularly irregular/ AFib , 2-3/6 precordial systolic murmur , no gallop  , no rub, Nl s1 s2                 -  JVD+full , edema +1, stasis changes- none, varices- none           Lung- + bilateral squeaks and a few crackles, no wheeze , cough-none , dullness-none, rub- none           Chest wall- healed VATS thoracotomy incision L Abd-   Br/ Gen/ Rectal- Not done, not indicated Extrem-  cyanosis- none, clubbing, none, atrophy- none, strength- nl. + Wheelchair for distance/ cane at home.  Neuro- grossly intact to observation

## 2017-09-04 ENCOUNTER — Ambulatory Visit (INDEPENDENT_AMBULATORY_CARE_PROVIDER_SITE_OTHER): Payer: Medicare Other | Admitting: Cardiology

## 2017-09-04 ENCOUNTER — Encounter: Payer: Self-pay | Admitting: Cardiology

## 2017-09-04 VITALS — BP 132/74 | HR 65 | Ht 66.0 in | Wt 149.8 lb

## 2017-09-04 DIAGNOSIS — I482 Chronic atrial fibrillation, unspecified: Secondary | ICD-10-CM

## 2017-09-04 DIAGNOSIS — I10 Essential (primary) hypertension: Secondary | ICD-10-CM | POA: Diagnosis not present

## 2017-09-04 DIAGNOSIS — I5032 Chronic diastolic (congestive) heart failure: Secondary | ICD-10-CM | POA: Diagnosis not present

## 2017-09-04 NOTE — Assessment & Plan Note (Signed)
Stable without exacerbation or evident progression as she continues prednisone 5 mg daily maintenance. Plan-no change

## 2017-09-04 NOTE — Assessment & Plan Note (Signed)
Managed by cardiology.  Rhythm and fluid retention seem well controlled.

## 2017-09-04 NOTE — Patient Instructions (Addendum)
Continue your current therapy  I will see you in 6 months

## 2017-09-04 NOTE — Assessment & Plan Note (Signed)
She continues to depend on oxygen every night for sleep.  Activity level is limited during the day and room air seems sufficient.

## 2017-09-13 ENCOUNTER — Emergency Department (HOSPITAL_COMMUNITY): Payer: Medicare Other

## 2017-09-13 ENCOUNTER — Inpatient Hospital Stay (HOSPITAL_COMMUNITY)
Admission: EM | Admit: 2017-09-13 | Discharge: 2017-09-17 | DRG: 871 | Disposition: A | Discharge status: Home Health | Payer: Medicare Other | Attending: Internal Medicine | Admitting: Internal Medicine

## 2017-09-13 ENCOUNTER — Encounter (HOSPITAL_COMMUNITY): Payer: Self-pay | Admitting: *Deleted

## 2017-09-13 ENCOUNTER — Other Ambulatory Visit: Payer: Self-pay

## 2017-09-13 DIAGNOSIS — Z8701 Personal history of pneumonia (recurrent): Secondary | ICD-10-CM

## 2017-09-13 DIAGNOSIS — R000 Tachycardia, unspecified: Secondary | ICD-10-CM | POA: Diagnosis not present

## 2017-09-13 DIAGNOSIS — Z7952 Long term (current) use of systemic steroids: Secondary | ICD-10-CM

## 2017-09-13 DIAGNOSIS — N179 Acute kidney failure, unspecified: Secondary | ICD-10-CM | POA: Diagnosis not present

## 2017-09-13 DIAGNOSIS — J159 Unspecified bacterial pneumonia: Secondary | ICD-10-CM | POA: Diagnosis present

## 2017-09-13 DIAGNOSIS — R918 Other nonspecific abnormal finding of lung field: Secondary | ICD-10-CM | POA: Diagnosis not present

## 2017-09-13 DIAGNOSIS — I110 Hypertensive heart disease with heart failure: Secondary | ICD-10-CM | POA: Diagnosis not present

## 2017-09-13 DIAGNOSIS — Z923 Personal history of irradiation: Secondary | ICD-10-CM | POA: Diagnosis not present

## 2017-09-13 DIAGNOSIS — M3130 Wegener's granulomatosis without renal involvement: Secondary | ICD-10-CM | POA: Diagnosis present

## 2017-09-13 DIAGNOSIS — E785 Hyperlipidemia, unspecified: Secondary | ICD-10-CM | POA: Diagnosis present

## 2017-09-13 DIAGNOSIS — Z8673 Personal history of transient ischemic attack (TIA), and cerebral infarction without residual deficits: Secondary | ICD-10-CM | POA: Diagnosis not present

## 2017-09-13 DIAGNOSIS — J181 Lobar pneumonia, unspecified organism: Secondary | ICD-10-CM

## 2017-09-13 DIAGNOSIS — R062 Wheezing: Secondary | ICD-10-CM | POA: Diagnosis not present

## 2017-09-13 DIAGNOSIS — Z7901 Long term (current) use of anticoagulants: Secondary | ICD-10-CM

## 2017-09-13 DIAGNOSIS — F0390 Unspecified dementia without behavioral disturbance: Secondary | ICD-10-CM | POA: Diagnosis present

## 2017-09-13 DIAGNOSIS — Q248 Other specified congenital malformations of heart: Secondary | ICD-10-CM

## 2017-09-13 DIAGNOSIS — Q249 Congenital malformation of heart, unspecified: Secondary | ICD-10-CM | POA: Diagnosis not present

## 2017-09-13 DIAGNOSIS — I313 Pericardial effusion (noninflammatory): Secondary | ICD-10-CM | POA: Diagnosis not present

## 2017-09-13 DIAGNOSIS — E1122 Type 2 diabetes mellitus with diabetic chronic kidney disease: Secondary | ICD-10-CM | POA: Diagnosis present

## 2017-09-13 DIAGNOSIS — Z853 Personal history of malignant neoplasm of breast: Secondary | ICD-10-CM

## 2017-09-13 DIAGNOSIS — I340 Nonrheumatic mitral (valve) insufficiency: Secondary | ICD-10-CM | POA: Diagnosis not present

## 2017-09-13 DIAGNOSIS — I5023 Acute on chronic systolic (congestive) heart failure: Secondary | ICD-10-CM | POA: Diagnosis not present

## 2017-09-13 DIAGNOSIS — E1121 Type 2 diabetes mellitus with diabetic nephropathy: Secondary | ICD-10-CM | POA: Diagnosis not present

## 2017-09-13 DIAGNOSIS — Z87891 Personal history of nicotine dependence: Secondary | ICD-10-CM

## 2017-09-13 DIAGNOSIS — Z9049 Acquired absence of other specified parts of digestive tract: Secondary | ICD-10-CM | POA: Diagnosis not present

## 2017-09-13 DIAGNOSIS — Z79890 Hormone replacement therapy (postmenopausal): Secondary | ICD-10-CM | POA: Diagnosis not present

## 2017-09-13 DIAGNOSIS — Z7984 Long term (current) use of oral hypoglycemic drugs: Secondary | ICD-10-CM

## 2017-09-13 DIAGNOSIS — Z803 Family history of malignant neoplasm of breast: Secondary | ICD-10-CM

## 2017-09-13 DIAGNOSIS — N184 Chronic kidney disease, stage 4 (severe): Secondary | ICD-10-CM | POA: Diagnosis not present

## 2017-09-13 DIAGNOSIS — I482 Chronic atrial fibrillation, unspecified: Secondary | ICD-10-CM | POA: Diagnosis present

## 2017-09-13 DIAGNOSIS — I130 Hypertensive heart and chronic kidney disease with heart failure and stage 1 through stage 4 chronic kidney disease, or unspecified chronic kidney disease: Secondary | ICD-10-CM | POA: Diagnosis present

## 2017-09-13 DIAGNOSIS — Z9981 Dependence on supplemental oxygen: Secondary | ICD-10-CM

## 2017-09-13 DIAGNOSIS — R931 Abnormal findings on diagnostic imaging of heart and coronary circulation: Secondary | ICD-10-CM | POA: Diagnosis not present

## 2017-09-13 DIAGNOSIS — E039 Hypothyroidism, unspecified: Secondary | ICD-10-CM | POA: Diagnosis present

## 2017-09-13 DIAGNOSIS — A419 Sepsis, unspecified organism: Principal | ICD-10-CM | POA: Diagnosis present

## 2017-09-13 DIAGNOSIS — Z801 Family history of malignant neoplasm of trachea, bronchus and lung: Secondary | ICD-10-CM | POA: Diagnosis not present

## 2017-09-13 DIAGNOSIS — Z79899 Other long term (current) drug therapy: Secondary | ICD-10-CM

## 2017-09-13 DIAGNOSIS — I10 Essential (primary) hypertension: Secondary | ICD-10-CM | POA: Diagnosis not present

## 2017-09-13 DIAGNOSIS — J440 Chronic obstructive pulmonary disease with acute lower respiratory infection: Secondary | ICD-10-CM | POA: Diagnosis present

## 2017-09-13 DIAGNOSIS — R05 Cough: Secondary | ICD-10-CM | POA: Diagnosis not present

## 2017-09-13 DIAGNOSIS — R0989 Other specified symptoms and signs involving the circulatory and respiratory systems: Secondary | ICD-10-CM | POA: Diagnosis not present

## 2017-09-13 DIAGNOSIS — J9611 Chronic respiratory failure with hypoxia: Secondary | ICD-10-CM | POA: Diagnosis present

## 2017-09-13 DIAGNOSIS — I272 Pulmonary hypertension, unspecified: Secondary | ICD-10-CM | POA: Diagnosis present

## 2017-09-13 DIAGNOSIS — J441 Chronic obstructive pulmonary disease with (acute) exacerbation: Secondary | ICD-10-CM | POA: Diagnosis present

## 2017-09-13 DIAGNOSIS — R0602 Shortness of breath: Secondary | ICD-10-CM | POA: Diagnosis not present

## 2017-09-13 DIAGNOSIS — R0600 Dyspnea, unspecified: Secondary | ICD-10-CM

## 2017-09-13 DIAGNOSIS — E119 Type 2 diabetes mellitus without complications: Secondary | ICD-10-CM

## 2017-09-13 DIAGNOSIS — R0902 Hypoxemia: Secondary | ICD-10-CM | POA: Diagnosis not present

## 2017-09-13 DIAGNOSIS — J189 Pneumonia, unspecified organism: Secondary | ICD-10-CM | POA: Diagnosis present

## 2017-09-13 HISTORY — DX: Secondary carcinoid tumors of other sites: C7B.09

## 2017-09-13 LAB — I-STAT CHEM 8, ED
BUN: 29 mg/dL — ABNORMAL HIGH (ref 6–20)
CREATININE: 1.9 mg/dL — AB (ref 0.44–1.00)
Calcium, Ion: 1.12 mmol/L — ABNORMAL LOW (ref 1.15–1.40)
Chloride: 100 mmol/L — ABNORMAL LOW (ref 101–111)
Glucose, Bld: 131 mg/dL — ABNORMAL HIGH (ref 65–99)
HCT: 24 % — ABNORMAL LOW (ref 36.0–46.0)
HEMOGLOBIN: 8.2 g/dL — AB (ref 12.0–15.0)
Potassium: 4.2 mmol/L (ref 3.5–5.1)
Sodium: 133 mmol/L — ABNORMAL LOW (ref 135–145)
TCO2: 23 mmol/L (ref 22–32)

## 2017-09-13 LAB — CBC WITH DIFFERENTIAL/PLATELET
BASOS PCT: 0 %
Basophils Absolute: 0 10*3/uL (ref 0.0–0.1)
EOS PCT: 0 %
Eosinophils Absolute: 0 10*3/uL (ref 0.0–0.7)
HEMATOCRIT: 27.4 % — AB (ref 36.0–46.0)
Hemoglobin: 9 g/dL — ABNORMAL LOW (ref 12.0–15.0)
LYMPHS PCT: 21 %
Lymphs Abs: 1.7 10*3/uL (ref 0.7–4.0)
MCH: 31.4 pg (ref 26.0–34.0)
MCHC: 32.8 g/dL (ref 30.0–36.0)
MCV: 95.5 fL (ref 78.0–100.0)
MONO ABS: 0.7 10*3/uL (ref 0.1–1.0)
Monocytes Relative: 8 %
NEUTROS ABS: 5.6 10*3/uL (ref 1.7–7.7)
Neutrophils Relative %: 71 %
Platelets: 165 10*3/uL (ref 150–400)
RBC: 2.87 MIL/uL — ABNORMAL LOW (ref 3.87–5.11)
RDW: 13.7 % (ref 11.5–15.5)
WBC: 8 10*3/uL (ref 4.0–10.5)

## 2017-09-13 LAB — I-STAT TROPONIN, ED: Troponin i, poc: 0.01 ng/mL (ref 0.00–0.08)

## 2017-09-13 LAB — I-STAT CG4 LACTIC ACID, ED: Lactic Acid, Venous: 1.62 mmol/L (ref 0.5–1.9)

## 2017-09-13 LAB — BRAIN NATRIURETIC PEPTIDE: B NATRIURETIC PEPTIDE 5: 1527.2 pg/mL — AB (ref 0.0–100.0)

## 2017-09-13 LAB — GLUCOSE, CAPILLARY: Glucose-Capillary: 142 mg/dL — ABNORMAL HIGH (ref 65–99)

## 2017-09-13 MED ORDER — SODIUM CHLORIDE 0.9 % IV SOLN
1.0000 g | Freq: Once | INTRAVENOUS | Status: AC
  Administered 2017-09-13: 1 g via INTRAVENOUS
  Filled 2017-09-13: qty 10

## 2017-09-13 MED ORDER — SODIUM CHLORIDE 0.9 % IV BOLUS
1000.0000 mL | Freq: Once | INTRAVENOUS | Status: AC
  Administered 2017-09-13: 1000 mL via INTRAVENOUS

## 2017-09-13 MED ORDER — ALBUTEROL SULFATE (2.5 MG/3ML) 0.083% IN NEBU
5.0000 mg | INHALATION_SOLUTION | Freq: Once | RESPIRATORY_TRACT | Status: AC
  Administered 2017-09-13: 5 mg via RESPIRATORY_TRACT
  Filled 2017-09-13: qty 6

## 2017-09-13 MED ORDER — SODIUM CHLORIDE 0.9 % IV SOLN
INTRAVENOUS | Status: DC
  Administered 2017-09-13 – 2017-09-15 (×2): via INTRAVENOUS

## 2017-09-13 MED ORDER — BENZONATATE 100 MG PO CAPS
100.0000 mg | ORAL_CAPSULE | Freq: Once | ORAL | Status: AC
  Administered 2017-09-13: 100 mg via ORAL
  Filled 2017-09-13: qty 1

## 2017-09-13 MED ORDER — AZITHROMYCIN 250 MG PO TABS
500.0000 mg | ORAL_TABLET | Freq: Once | ORAL | Status: AC
  Administered 2017-09-13: 500 mg via ORAL
  Filled 2017-09-13: qty 2

## 2017-09-13 MED ORDER — SODIUM CHLORIDE 0.9 % IV SOLN
500.0000 mg | INTRAVENOUS | Status: DC
  Administered 2017-09-14 – 2017-09-16 (×3): 500 mg via INTRAVENOUS
  Filled 2017-09-13 (×3): qty 500

## 2017-09-13 MED ORDER — IPRATROPIUM BROMIDE 0.02 % IN SOLN
0.5000 mg | Freq: Once | RESPIRATORY_TRACT | Status: AC
  Administered 2017-09-13: 0.5 mg via RESPIRATORY_TRACT
  Filled 2017-09-13: qty 2.5

## 2017-09-13 MED ORDER — INSULIN ASPART 100 UNIT/ML ~~LOC~~ SOLN
0.0000 [IU] | Freq: Three times a day (TID) | SUBCUTANEOUS | Status: DC
  Administered 2017-09-14: 3 [IU] via SUBCUTANEOUS
  Administered 2017-09-14: 2 [IU] via SUBCUTANEOUS
  Administered 2017-09-15: 1 [IU] via SUBCUTANEOUS
  Administered 2017-09-15: 2 [IU] via SUBCUTANEOUS

## 2017-09-13 MED ORDER — ACETAMINOPHEN 325 MG PO TABS
650.0000 mg | ORAL_TABLET | Freq: Once | ORAL | Status: AC
  Administered 2017-09-13: 650 mg via ORAL
  Filled 2017-09-13: qty 2

## 2017-09-13 MED ORDER — SODIUM CHLORIDE 0.9 % IV SOLN
1.0000 g | INTRAVENOUS | Status: DC
  Administered 2017-09-14 – 2017-09-16 (×3): 1 g via INTRAVENOUS
  Filled 2017-09-13: qty 1
  Filled 2017-09-13: qty 10
  Filled 2017-09-13 (×2): qty 1

## 2017-09-13 NOTE — ED Provider Notes (Signed)
[name] [name]   CSN: [id number] Arrival date & time: 09/13/17  1927     History   Chief Complaint No chief complaint on file.   HPI [name] [name] is a 84 y.o. female.  HPI   84 year old female with history of chronic atrial fibrillation currently on Eliquis, breast cancer, CHF, CVA, diabetes, hypertension brought here via EMS from home for evaluation of shortness of breath.  Patient report 4 days ago she took her dog to a grooming plays.  She endorsed smelling some strong fumes at the grooming place which aggravate her breathing.  The next day, she felt weak, having sinus congestion, cough productive with yellow sputum, with fever and chills.  She does endorse some increased shortness of breath with cough.  Symptoms persistent and now is she has having a fever prompting her to come here for further evaluation.  Patient does use oxygen at home only when sleeping.  She did endorse some nausea and 1-2 episodes of vomiting after coughing this morning.  She denies any significant pain.  Denies headache, exertional chest pain, abdominal pain, constipation, diarrhea, dysuria or rash.  She lives at home with her husband.  She denies any recent travel, recent surgery, or prolonged bedrest.  She denies any fluid gain in her legs.  She does take fluid pills.  When EMS arrived, patient was found to have some slight wheezing and she was given 5 mg of albuterol.  Patient report improvement of his symptoms.  She also receive 4 mg of Zofran IV for nausea.  She was found to be mildly hypoxic with an O2 of 91% on room air.  O2 improved to 97% on 3 L of oxygen.  Past Medical History:  Diagnosis Date  . Arthritis    r tkr  . Breast cancer ([place])   . Chronic atrial fibrillation (HCC)    CHADS2-VASc=6.  on low dose Eliquis (corrected for age & renal fxn)  . CVA (cerebral vascular accident) ([place]) 2008   mini stroke.no residual  . DEPRESSION 05/16/2009  . Diabetes  mellitus type II   . Heart murmur    stress test 2009.dr peter [place]  . HYPERLIPIDEMIA 07/24/2008  . HYPERTENSION 07/24/2008  . HYPOTHYROIDISM 07/24/2008  . Intermittent vertigo   . Pneumonia   . Pulmonary hypertension ([place])    Mod PHTN on Echo 03/2016: 2/2 combination of Wegener's, HFPF & Afib.  . Skin abnormality    facial lesions .pt applying mupiracin to areas  . Vertigo   . Wegener's disease, pulmonary ([place]) 10/2012    Patient Active Problem List   Diagnosis Date Noted  . Acute on chronic respiratory failure with hypoxia ([place]) 09/29/2016  . Malnutrition of moderate degree 07/01/2016  . Incarcerated hernia 06/30/2016  . Recurrent umbilical hernia with incarceration 06/30/2016  . CAP (community acquired pneumonia) 04/03/2016  . Cellulitis and abscess   . Diabetic foot ulcer ([place]) 12/10/2015  . Hereditary and idiopathic peripheral neuropathy 09/25/2014  . Dependent edema 09/25/2014  . UTI (lower urinary tract infection)   . Type 2 diabetes mellitus, controlled ([place]) 07/19/2014  . Chronic atrial fibrillation (HCC) CHADS2-VASc=6.  On Corrected "low" dose of Eliquis 04/04/2014  . CKD (chronic kidney disease) stage 3, GFR 30-59 ml/min (HCC) 04/04/2014  . Abdominal hernia without obstruction or gangrene 01/09/2014  . Nausea alone 08/21/2013  . Pneumonia of left lower lobe due to infectious organism ([place]) 08/18/2013  . Diastolic CHF, chronic (HCC) - on low dose Lasix.  ARB & BB along with Norvasc 08/18/2013  . COPD mixed type ([place]) 08/18/2013  . Sepsis ([place]) 08/18/2013  . Hand pain, left 08/18/2013  . COPD with acute exacerbation ([place]) 05/10/2013  . Chronic kidney disease (CKD), stage IV (severe) ([place]) 05/08/2013  . Normocytic anemia 05/08/2013  . Pulmonary hypertension ([place]) 05/07/2013  . Right-sided heart failure ([place]) 05/07/2013  . Pancreatic cyst 04/17/2013  . Obesity (BMI 30-39.9) 12/28/2012  . Wegener's granulomatosis (granulomatosis with polyangiitis) ([place]) 11/02/2012  .  Cough with hemoptysis 11/01/2012  . Anemia 11/01/2012  . Dehydration 11/01/2012  . Diffuse pulmonary alveolar hemorrhage 11/01/2012  . Breast cancer ([place]) 09/14/2012  . Pruritus of skin 05/20/2012  . Carcinoid tumor of colon, malignant ([place]) 05/16/2011  . Cryptogenic organizing pneumonia 04/05/2011  . Vertigo, intermittent 10/21/2010  . Type 2 diabetes mellitus ([place]) 06/04/2010  . Edema 03/25/2010  . Hyponatremia 02/13/2010  . DEPRESSION 05/16/2009  . Hypothyroidism 07/24/2008  . Hyperlipidemia 07/24/2008  . Essential hypertension 07/24/2008  . OSTEOARTHRITIS 07/24/2008    Past Surgical History:  Procedure Laterality Date  . BREAST SURGERY  2007   Lumpectomy, XRT 2006.l breast  . CHOLECYSTECTOMY  1980  . EXPLORATORY LAPAROTOMY    . HEEL SPUR SURGERY Right   . JOINT REPLACEMENT     r knee  . KNEE SURGERY  2009   TKR  . TRANSTHORACIC ECHOCARDIOGRAM  03/2016   Afib (no Diastolic Fxn). EF 55-60%. No RWMA. Mild Aortic dilation Mild MR. Severe LA dilation. Mod RA dilation.  Mod TR with Mod-Severe Pulmonary HTN - but normal RV function..  . VENTRAL HERNIA REPAIR N/A 07/01/2016   Procedure: REPAIR OF INCARCERATED INCISIONAL HERNIA ;  Surgeon: Stark Klein, MD;  Location: [facility];  Service: General;  Laterality: N/A;  . VIDEO ASSISTED THORACOSCOPY  04/22/2011   Procedure: VIDEO ASSISTED THORACOSCOPY;  Surgeon: Melrose Nakayama, MD;  Location: [facility];  Service: Thoracic;  Laterality: Left;  WITH BIOPSY     OB History   None      Home Medications    Prior to Admission medications   Medication Sig Start Date End Date Taking? Authorizing Provider  acetaminophen (TYLENOL) 500 MG tablet Take 500 mg by mouth every 6 (six) hours as needed for headache.    [provider]  amLODipine (NORVASC) 5 MG tablet TAKE ONE TABLET BY MOUTH ONCE DAILY 03/19/17   Burchette, Alinda Sierras, MD  BYSTOLIC 10 MG tablet TAKE 1 TABLET BY MOUTH ONCE DAILY AFTER BREAKFAST 07/13/17   Burchette, Alinda Sierras,  MD  diazepam (VALIUM) 5 MG tablet TAKE ONE TABLET BY MOUTH EVERY 12 HOURS AS NEEDED FOR SEVERE  VERTIGO  FLARES.  AVOID  REGULAR  USE. 01/13/17   Burchette, Alinda Sierras, MD  ELIQUIS 2.5 MG TABS tablet TAKE 1 TABLET BY MOUTH TWICE DAILY 08/26/17   Burchette, Alinda Sierras, MD  furosemide (LASIX) 20 MG tablet Take 1 tablet (20 mg total) by mouth daily. 09/04/16   Burchette, Alinda Sierras, MD  gabapentin (NEURONTIN) 100 MG capsule TAKE 1 CAPSULE BY MOUTH TWICE DAILY BEFORE MEAL(S) 03/11/17   Burchette, Alinda Sierras, MD  glimepiride (AMARYL) 2 MG tablet TAKE ONE TABLET BY MOUTH ONCE DAILY BEFORE BREAKFAST 10/07/16   Burchette, Alinda Sierras, MD  guaiFENesin (MUCINEX) 600 MG 12 hr tablet Take 600 mg by mouth 2 (two) times daily as needed for cough.    [provider]  levothyroxine (SYNTHROID, LEVOTHROID) 100 MCG tablet TAKE 1 TABLET BY MOUTH ONCE DAILY BEFORE BREAKFAST. 07/27/17  Burchette, Alinda Sierras, MD  predniSONE (DELTASONE) 5 MG tablet TAKE 1 TABLET BY MOUTH ONCE DAILY WITH  BREAKFAST 08/10/17   Deneise Lever, MD    Family History Family History  Problem Relation Age of Onset  . Arthritis Mother   . Rheum arthritis Mother   . Heart disease Father   . Coronary artery disease Father   . Cancer Sister        breast CA, both sisters  . Breast cancer Sister   . Coronary artery disease Brother   . Lung cancer Brother     Social History Social History   Tobacco Use  . Smoking status: Former Smoker    Packs/day: 0.50    Years: 12.00    Pack years: 6.00    Types: Cigarettes    Last attempt to quit: 06/04/1982    Years since quitting: 35.3  . Smokeless tobacco: Never Used  Substance Use Topics  . Alcohol use: No  . Drug use: No     Allergies   Codeine sulfate; Sulfonamide derivatives; Vancomycin; and Atarax [hydroxyzine]   Review of Systems Review of Systems  All other systems reviewed and are negative.    Physical Exam Updated Vital Signs BP (!) 115/58 (BP Location: Right Arm)   Pulse (!) 123    Temp (!) 102.5 F (39.2 C) (Rectal)   Resp 18   Ht _0  (1.676 m)   Wt 67.6 kg (149 lb)   SpO2 91%   BMI 24.05 kg/m   Physical Exam  Constitutional: She is oriented to person, place, and time. She appears well-developed and well-nourished. No distress.  HENT:  Head: Atraumatic.  Eyes: Conjunctivae are normal.  Neck: Neck supple. No JVD present.  Cardiovascular:  Irregularly irregular heart rhythm with systolic heart murmur  Pulmonary/Chest: Effort normal.  Decreased breath sounds with scattered wheezes, and crackles heard at the lung bases.  Abdominal: Soft. She exhibits no distension. There is no tenderness.  Musculoskeletal: Normal range of motion.       Right lower leg: She exhibits no edema.       Left lower leg: She exhibits no edema.  Neurological: She is alert and oriented to person, place, and time.  Skin: Skin is warm. Capillary refill takes less than 2 seconds. No rash noted.  Psychiatric: She has a normal mood and affect.  Nursing [name] and vitals reviewed.    ED Treatments / Results  Labs (all labs ordered are listed, but only abnormal results are displayed) Labs Reviewed  CBC WITH DIFFERENTIAL/PLATELET - Abnormal; Notable for the following components:      Result Value   RBC 2.87 (*)    Hemoglobin 9.0 (*)    HCT 27.4 (*)    All other components within normal limits  BRAIN NATRIURETIC PEPTIDE - Abnormal; Notable for the following components:   B Natriuretic Peptide 1,527.2 (*)    All other components within normal limits  I-STAT CHEM 8, ED - Abnormal; Notable for the following components:   Sodium 133 (*)    Chloride 100 (*)    BUN 29 (*)    Creatinine, Ser 1.90 (*)    Glucose, Bld 131 (*)    Calcium, Ion 1.12 (*)    Hemoglobin 8.2 (*)    HCT 24.0 (*)    All other components within normal limits  I-STAT TROPONIN, ED  I-STAT CG4 LACTIC ACID, ED    EKG EKG Interpretation  Date/Time:  Sunday September 13 2017 19:53:16 EDT  Ventricular Rate:  95 PR  Interval:    QRS Duration: 100 QT Interval:  325 QTC Calculation: 409 R Axis:   77 Text Interpretation:  Atrial fibrillation Borderline repolarization abnormality No STEMI. Similar to 2018 tracing.  Confirmed by Nanda Quinton [phone]) on 09/13/2017 8:06:15 PM   Radiology Dg Chest 2 View  Result Date: 09/13/2017 CLINICAL DATA:  Cough and fever for 24 hours EXAM: CHEST - 2 VIEW COMPARISON:  February 02, 2017 FINDINGS: The heart size and mediastinal contours are stable. Heart size is enlarged. Possible patchy consolidation of the left mid lung and left lung base is noted. The right lung is clear. There is cephalization of flow. The visualized skeletal structures are stable. IMPRESSION: Cephalization of flow without frank pulmonary edema. Possible consolidation of the left mid lung and left lung base. Electronically Signed   By: Abelardo Diesel M.D.   On: 09/13/2017 21:09    Procedures Procedures (including critical care time)  Medications Ordered in ED Medications  cefTRIAXone (ROCEPHIN) 1 g in sodium chloride 0.9 % 100 mL IVPB (1 g Intravenous New Bag/Given 09/13/17 2151)  azithromycin (ZITHROMAX) tablet 500 mg (has no administration in time range)  acetaminophen (TYLENOL) tablet 650 mg (650 mg Oral Given 09/13/17 2008)  albuterol (PROVENTIL) (2.5 MG/3ML) 0.083% nebulizer solution 5 mg (5 mg Nebulization Given 09/13/17 2010)  ipratropium (ATROVENT) nebulizer solution 0.5 mg (0.5 mg Nebulization Given 09/13/17 2010)  sodium chloride 0.9 % bolus 1,000 mL (0 mLs Intravenous Stopped 09/13/17 2145)  benzonatate (TESSALON) capsule 100 mg (100 mg Oral Given 09/13/17 2059)     Initial Impression / Assessment and Plan / ED Course  I have reviewed the triage vital signs and the nursing notes.  Pertinent labs & imaging results that were available during my care of the patient were reviewed by me and considered in my medical decision making (see chart for details).     BP (!) 115/58 (BP Location: Right Arm)    Pulse (!) 123   Temp (!) 102.5 F (39.2 C) (Rectal)   Resp 18   Ht _0  (1.676 m)   Wt 67.6 kg (149 lb)   SpO2 91%   BMI 24.05 kg/m    Final Clinical Impressions(s) / ED Diagnoses   Final diagnoses:  Community acquired pneumonia of left lower lobe of lung ([place])  Acute on chronic systolic congestive heart failure [facility])    ED Discharge Orders    None     8:08 PM Patient here with productive cough, fever, and shortness of breath suggestive of suspect pneumonia.  History of CHF but does not appear to be fluid overload.  She has a temperature of 102.3, Tylenol given.  IV fluid administered, chest x-ray ordered, labs obtained. Will also give duonebs.  Care discussed with Dr. Laverta Baltimore.   9:38 PM Chest x-ray demonstrate possible consolidation of the left midlung and left lung base.  Elevated BNP of 1527.  Normal WBC, normal lactic acid.  EKG shows atrial fibrillation.  Normal troponin.  Patient has a port score of 123 which put her at a risk class IV and therefore, pt will be given treatment for CAP with Rocephin/zithromax. Will consult for admission.    9:56 PM Appreciate consultation from [facility] Dr. Jonelle Sidle who agrees to see pt in the ER and will admit for further care.    Domenic Moras, PA-C 09/13/17 2158    Margette Fast, MD 09/14/17 1051

## 2017-09-13 NOTE — ED Notes (Signed)
ED TO INPATIENT HANDOFF REPORT  Name/Age/Gender [name] [name] 84 y.o. female  Code Status Code Status History    Date Active Date Inactive Code Status Order ID Comments User Context   09/29/2016 1148 10/01/2016 1742 Full Code [id number]  Verlee Monte, MD Inpatient   06/30/2016 1648 07/05/2016 1612 Full Code [id number]  Debbe Odea, MD ED   04/02/2016 1818 04/05/2016 1622 Full Code [id number]  Samuella Cota, MD Inpatient   12/10/2015 1549 12/13/2015 1847 Full Code [id number]  Tawni Millers, MD Inpatient   09/25/2014 2003 09/27/2014 1552 Full Code [id number]  Waldemar Dickens, MD Inpatient   04/04/2014 1612 04/06/2014 2013 Full Code [id number]  Orson Eva, MD ED   08/18/2013 1356 08/21/2013 1319 Full Code [id number]  Modena Jansky, MD Inpatient   05/07/2013 1908 05/10/2013 1956 Full Code [id number]  Kelvin Cellar, MD Inpatient   04/13/2013 2048 04/18/2013 1921 Full Code [id number]  Orson Eva, MD Inpatient   11/01/2012 1128 11/08/2012 1836 Full Code [id number]  Modena Jansky, MD Inpatient   04/22/2011 1255 04/26/2011 1607 Full Code [id number]  Love, Kaylyn Layer, RN Inpatient    Advance Directive Documentation     Most Recent Value  Type of Advance Directive  Living will, Healthcare Power of Attorney  Pre-existing out of facility DNR order (yellow form or pink MOST form)  -  "MOST" Form in Place?  -      Home/SNF/Other Home  Chief Complaint Shortness of breath; fever  Level of Care/Admitting Diagnosis ED Disposition    ED Disposition Condition [place]: [place] [100102]  Level of Care: Telemetry [5]  Admit to tele based on following criteria: Complex arrhythmia (Bradycardia/Tachycardia)  Diagnosis: Pneumonia [488891]  Admitting Physician: Elwyn Reach [[id number]]  Attending Physician: Elwyn Reach [[id number]]  Estimated length of stay: past midnight tomorrow  Certification:: I certify this patient will need inpatient services for at  least 2 midnights  PT Class (Do Not Modify): Inpatient [101]  PT Acc Code (Do Not Modify): Private [1]       Medical History Past Medical History:  Diagnosis Date  . Arthritis    r tkr  . Breast cancer ([place])   . Chronic atrial fibrillation (HCC)    CHADS2-VASc=6.  on low dose Eliquis (corrected for age & renal fxn)  . CVA (cerebral vascular accident) ([place]) 2008   mini stroke.no residual  . DEPRESSION 05/16/2009  . Diabetes mellitus type II   . Heart murmur    stress test 2009.dr peter [place]  . HYPERLIPIDEMIA 07/24/2008  . HYPERTENSION 07/24/2008  . HYPOTHYROIDISM 07/24/2008  . Intermittent vertigo   . Pneumonia   . Pulmonary hypertension ([place])    Mod PHTN on Echo 03/2016: 2/2 combination of Wegener's, HFPF & Afib.  . Skin abnormality    facial lesions .pt applying mupiracin to areas  . Vertigo   . 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 Codeine Sulfate Other (See Comments)    REACTION: GI upset  . Sulfonamide Derivatives Other (See Comments)    REACTION: GI upset  . Vancomycin Other (See Comments)    Red man syndrome  . Atarax [Hydroxyzine] Nausea Only and Rash    IV Location/Drains/Wounds Patient Lines/Drains/Airways Status   Active Line/Drains/Airways    Name:   Placement date:   Placement time:   Site:   Days:   Peripheral IV 09/13/17 Right Hand   09/13/17  1935    Hand   less than 1   Incision (Closed) 07/01/16 Abdomen Other (Comment)   07/01/16    1551     439   Wound / Incision (Open or Dehisced) 12/10/15 Diabetic ulcer Foot Right open blister with erythemia   12/10/15    1715    Foot   643          Labs/Imaging Results for orders placed or performed during the hospital encounter of 09/13/17 (from the past 48 hour(s))  CBC with Differential/Platelet     Status: Abnormal   Collection Time: 09/13/17  8:20 PM  Result Value Ref Range   WBC 8.0 4.0 - 10.5 K/uL   RBC 2.87 (L) 3.87 - 5.11 MIL/uL   Hemoglobin  9.0 (L) 12.0 - 15.0 g/dL   HCT 27.4 (L) 36.0 - 46.0 %   MCV 95.5 78.0 - 100.0 fL   MCH 31.4 26.0 - 34.0 pg   MCHC 32.8 30.0 - 36.0 g/dL   RDW 13.7 11.5 - 15.5 %   Platelets 165 150 - 400 K/uL   Neutrophils Relative % 71 %   Neutro Abs 5.6 1.7 - 7.7 K/uL   Lymphocytes Relative 21 %   Lymphs Abs 1.7 0.7 - 4.0 K/uL   Monocytes Relative 8 %   Monocytes Absolute 0.7 0.1 - 1.0 K/uL   Eosinophils Relative 0 %   Eosinophils Absolute 0.0 0.0 - 0.7 K/uL   Basophils Relative 0 %   Basophils Absolute 0.0 0.0 - 0.1 K/uL    Comment: Performed at [facility], [place] [address], [place], [place] [address]  Brain natriuretic peptide     Status: Abnormal   Collection Time: 09/13/17  8:20 PM  Result Value Ref Range   B Natriuretic Peptide 1,527.2 (H) 0.0 - 100.0 pg/mL    Comment: Performed at [facility], [place] [address], [place], [place] [address]  I-stat troponin, ED     Status: None   Collection Time: 09/13/17  8:27 PM  Result Value Ref Range   Troponin i, poc 0.01 0.00 - 0.08 ng/mL   Comment 3            Comment: Due to the release kinetics of cTnI, a negative result within the first hours of the onset of symptoms does not rule out myocardial infarction with certainty. If myocardial infarction is still suspected, repeat the test at appropriate intervals.   I-stat chem 8, ed     Status: Abnormal   Collection Time: 09/13/17  8:30 PM  Result Value Ref Range   Sodium 133 (L) 135 - 145 mmol/L   Potassium 4.2 3.5 - 5.1 mmol/L   Chloride 100 (L) 101 - 111 mmol/L   BUN 29 (H) 6 - 20 mg/dL   Creatinine, Ser 1.90 (H) 0.44 - 1.00 mg/dL   Glucose, Bld 131 (H) 65 - 99 mg/dL   Calcium, Ion 1.12 (L) 1.15 - 1.40 mmol/L   TCO2 23 22 - 32 mmol/L   Hemoglobin 8.2 (L) 12.0 - 15.0 g/dL   HCT 24.0 (L) 36.0 - 46.0 %  I-Stat CG4 Lactic Acid, ED     Status: None   Collection Time: 09/13/17  8:32 PM  Result Value Ref Range   Lactic Acid, Venous 1.62 0.5 - 1.9 mmol/L    Dg Chest 2 View  Result Date: 09/13/2017 CLINICAL DATA:  Cough and fever for 24 hours EXAM: CHEST - 2 VIEW COMPARISON:  February 02, 2017  FINDINGS: The heart size and mediastinal contours are stable. Heart size is enlarged. Possible patchy consolidation of the left mid lung and left lung base is noted. The right lung is clear. There is cephalization of flow. The visualized skeletal structures are stable. IMPRESSION: Cephalization of flow without frank pulmonary edema. Possible consolidation of the left mid lung and left lung base. Electronically Signed   By: Abelardo Diesel M.D.   On: 09/13/2017 21:09    Pending Labs [employer] (From admission, onward)   Start     Ordered   Signed and Held  Culture, blood (routine x 2) Call MD if unable to obtain prior to antibiotics being given  BLOOD CULTURE X 2,   R    Comments:  If blood cultures drawn in Emergency Department - Do not draw and cancel order    Signed and Held   Signed and Held  Culture, sputum-assessment  Once,   R     Signed and Held   Signed and Held  Gram stain  Once,   R     Signed and Held   Signed and Held  HIV antibody (Routine Screening)  Once,   R     Signed and Held   Signed and Held  Strep pneumoniae urinary antigen  Once,   R     Signed and Held   Signed and Held  Comprehensive metabolic panel  Tomorrow morning,   R     Signed and Held   Signed and Held  CBC WITH DIFFERENTIAL  Once,   R     Signed and Held      Vitals/Pain Today's Vitals   09/13/17 2100 09/13/17 2200 09/13/17 2211 09/13/17 2233  BP:  (!) 102/53    Pulse:  (!) 104  91  Resp:  (!) 32  20  Temp:      TempSrc:      SpO2:  (!) 89%  98%  Weight:      Height:      PainSc: 0-No pain  0-No pain     Isolation Precautions No active isolations  Medications Medications  acetaminophen (TYLENOL) tablet 650 mg (650 mg Oral Given 09/13/17 2008)  albuterol (PROVENTIL) (2.5 MG/3ML) 0.083% nebulizer solution 5 mg (5 mg Nebulization Given 09/13/17 2010)   ipratropium (ATROVENT) nebulizer solution 0.5 mg (0.5 mg Nebulization Given 09/13/17 2010)  sodium chloride 0.9 % bolus 1,000 mL (0 mLs Intravenous Stopped 09/13/17 2145)  benzonatate (TESSALON) capsule 100 mg (100 mg Oral Given 09/13/17 2059)  cefTRIAXone (ROCEPHIN) 1 g in sodium chloride 0.9 % 100 mL IVPB (0 g Intravenous Stopped 09/13/17 2234)  azithromycin (ZITHROMAX) tablet 500 mg (500 mg Oral Given 09/13/17 2208)    Mobility walks with device

## 2017-09-13 NOTE — ED Notes (Signed)
Bed: FU07 Expected date:  Expected time:  Means of arrival:  Comments: 84 yo F/ Fever-Shortness of breath

## 2017-09-13 NOTE — ED Triage Notes (Signed)
Pt coming from home and bib EMS.  Pt has had SOB and fever x 3 days.  Pt had n/v x 2 days.  Pt reported a productive cough with green mucus.  EMS initially heard some wheezing on her right lung and pt was treated with 24m of albuterol.  Wheezing has disappeared after and pt reports relief of SOB.  EMS got a temp of 101.1 and pt feels hot to the touch.  EMS also gave 447mIV zofran and reports that nausea has improved but is still present.  Pt a/o x 4.  On EMS arrival, pt was 91% RA and EMS put pt on 3L and O2 sat is 97%.

## 2017-09-13 NOTE — ED Notes (Signed)
Attempted to call report, Vanita Ingles RN is in a pt's room and will call me back.

## 2017-09-13 NOTE — ED Notes (Signed)
Pt placed on 2L [place]

## 2017-09-13 NOTE — H&P (Signed)
History and Physical    [name] [name]:[id number] DOB: Aug 07, 1932 DOA: 09/13/2017  Referring MD/NP/PA: Dr Laverta Baltimore  PCP: [name], MD   Outpatient Specialists: None   Patient coming from: Home  Chief Complaint: Shortness of breath  HPI: [name] [name] is a 84 y.o. female with medical history significant of dementia, chronic atrial fibrillation, diabetes, hyperlipidemia, breast cancer and previous CVA, chronic kidney disease stage III who presented to the ER with shortness of breath and cough.  She has history of COPD also and uses oxygen at night.  Patient reported sick contact with her son who was having upper respiratory infection.  She also went to take her dog to grooming place about 4 days ago otherwise she has been home for the most part.  She has had recurrent pneumonias over the last few years.  She uses 2 L of oxygen at night.  Patient denied any dysphagia or any severe GERD.  She was brought into the ER where she was evaluated and found to have pneumonia.  Her PORT score was 125 and therefore qualifies for inpatient care.  ED Course: Initial temperature is 102.5 with pulse of 123 respiratory rate of 32 and oxygen sat 89% on room air.  White count is 8.0 hemoglobin 8.2 sodium 133 potassium 4.2 BUN 29 and creatinine 1.90.  Lactic acid is however 1.62.  Checks x-ray showed Possible consolidation of the left mid lung and left lung base.  Patient received IV Rocephin and Zithromax and is being admitted for inpatient care  Review of Systems: As per HPI otherwise 10 point review of systems negative.   Past Medical History:  Diagnosis Date  . Arthritis    r tkr  . Breast cancer ([place])   . Chronic atrial fibrillation (HCC)    CHADS2-VASc=6.  on low dose Eliquis (corrected for age & renal fxn)  . CVA (cerebral vascular accident) ([place]) 2008   mini stroke.no residual  . DEPRESSION 05/16/2009  . Diabetes mellitus type II   . Heart murmur    stress test 2009.dr peter [place]  .  HYPERLIPIDEMIA 07/24/2008  . HYPERTENSION 07/24/2008  . HYPOTHYROIDISM 07/24/2008  . Intermittent vertigo   . Pneumonia   . Pulmonary hypertension ([place])    Mod PHTN on Echo 03/2016: 2/2 combination of Wegener's, HFPF & Afib.  . Skin abnormality    facial lesions .pt applying mupiracin to areas  . Vertigo   . 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 BREAST SURGERY  2007   Lumpectomy, XRT 2006.l breast  . CHOLECYSTECTOMY  1980  . EXPLORATORY LAPAROTOMY    . HEEL SPUR SURGERY Right   . JOINT REPLACEMENT     r knee  . KNEE SURGERY  2009   TKR  . TRANSTHORACIC ECHOCARDIOGRAM  03/2016   Afib (no Diastolic Fxn). EF 55-60%. No RWMA. Mild Aortic dilation Mild MR. Severe LA dilation. Mod RA dilation.  Mod TR with Mod-Severe Pulmonary HTN - but normal RV function..  . VENTRAL HERNIA REPAIR N/A 07/01/2016   Procedure: REPAIR OF INCARCERATED INCISIONAL HERNIA ;  Surgeon: Stark Klein, MD;  Location: [facility];  Service: General;  Laterality: N/A;  . VIDEO ASSISTED THORACOSCOPY  04/22/2011   Procedure: VIDEO ASSISTED THORACOSCOPY;  Surgeon: Melrose Nakayama, MD;  Location: [place];  Service: Thoracic;  Laterality: Left;  WITH BIOPSY     reports that she quit smoking about 35 years ago. Her smoking  use included cigarettes. She has a 6.00 pack-year smoking history. She has never used smokeless tobacco. She reports that she does not drink alcohol or use drugs.  Allergies  Allergen Reactions  . Codeine Sulfate Other (See Comments)    REACTION: GI upset  . Sulfonamide Derivatives Other (See Comments)    REACTION: GI upset  . Vancomycin Other (See Comments)    Red man syndrome  . Atarax [Hydroxyzine] Nausea Only and Rash    Family History  Problem Relation Age of Onset  . Arthritis Mother   . Rheum arthritis Mother   . Heart disease Father   . Coronary artery disease Father   . Cancer Sister        breast CA, both sisters  . Breast  cancer Sister   . Coronary artery disease Brother   . Lung cancer Brother      Prior to Admission medications   Medication Sig Start Date End Date Taking? Authorizing Provider  acetaminophen (TYLENOL) 500 MG tablet Take 500 mg by mouth every 6 (six) hours as needed for headache.   Yes [provider]  amLODipine (NORVASC) 5 MG tablet TAKE ONE TABLET BY MOUTH ONCE DAILY 03/19/17  Yes Burchette, Alinda Sierras, MD  BYSTOLIC 10 MG tablet TAKE 1 TABLET BY MOUTH ONCE DAILY AFTER BREAKFAST 07/13/17  Yes Burchette, Alinda Sierras, MD  diazepam (VALIUM) 5 MG tablet TAKE ONE TABLET BY MOUTH EVERY 12 HOURS AS NEEDED FOR SEVERE  VERTIGO  FLARES.  AVOID  REGULAR  USE. Patient taking differently: TAKE 2.5 mg BY MOUTH EVERY 12 HOURS AS NEEDED FOR SEVERE  VERTIGO  FLARES.  AVOID  REGULAR  USE. 01/13/17  Yes Burchette, Alinda Sierras, MD  ELIQUIS 2.5 MG TABS tablet TAKE 1 TABLET BY MOUTH TWICE DAILY 08/26/17  Yes Burchette, Alinda Sierras, MD  furosemide (LASIX) 20 MG tablet Take 1 tablet (20 mg total) by mouth daily. 09/04/16  Yes Burchette, Alinda Sierras, MD  gabapentin (NEURONTIN) 100 MG capsule TAKE 1 CAPSULE BY MOUTH TWICE DAILY BEFORE MEAL(S) 03/11/17  Yes Burchette, Alinda Sierras, MD  glimepiride (AMARYL) 2 MG tablet TAKE ONE TABLET BY MOUTH ONCE DAILY BEFORE BREAKFAST 10/07/16  Yes Burchette, Alinda Sierras, MD  guaiFENesin (MUCINEX) 600 MG 12 hr tablet Take 600 mg by mouth 2 (two) times daily as needed for cough.   Yes [provider]  levothyroxine (SYNTHROID, LEVOTHROID) 100 MCG tablet TAKE 1 TABLET BY MOUTH ONCE DAILY BEFORE BREAKFAST. 07/27/17  Yes Burchette, Alinda Sierras, MD  predniSONE (DELTASONE) 5 MG tablet TAKE 1 TABLET BY MOUTH ONCE DAILY WITH  BREAKFAST 08/10/17  Yes Baird Lyons D, MD    Physical Exam: Vitals:   09/13/17 2100 09/13/17 2200 09/13/17 2233 09/13/17 2315  BP:  (!) 102/53  122/78  Pulse:  (!) 104 91 79  Resp:  (!) 32 20 19  Temp: 99.5 F (37.5 C)   98.3 F (36.8 C)  TempSrc: Oral   Oral  SpO2:  (!) 89% 98% 99%   Weight:      Height:          Constitutional: NAD, calm, comfortable Vitals:   09/13/17 2100 09/13/17 2200 09/13/17 2233 09/13/17 2315  BP:  (!) 102/53  122/78  Pulse:  (!) 104 91 79  Resp:  (!) 32 20 19  Temp: 99.5 F (37.5 C)   98.3 F (36.8 C)  TempSrc: Oral   Oral  SpO2:  (!) 89% 98% 99%  Weight:  Height:        Awake alert no acute distress Eyes: PERRL, lids and conjunctivae normal ENMT: Mucous membranes are moist. Posterior pharynx clear of any exudate or lesions.Normal dentition.  Neck: normal, supple, no masses, no thyromegaly Respiratory: Mild basilar rhonchi no wheezing, no crackles. Normal respiratory effort. No accessory muscle use.  Cardiovascular: Regular rate and rhythm, no murmurs / rubs / gallops. No extremity edema. 2+ pedal pulses. No carotid bruits.  Abdomen: no tenderness, no masses palpated. No hepatosplenomegaly. Bowel sounds positive.  Musculoskeletal: no clubbing / cyanosis. No joint deformity upper and lower extremities. Good ROM, no contractures. Normal muscle tone.Skin: no rashes, lesions, ulcers. No induration Neurologic: CN 2-12 grossly intact. Sensation intact, DTR normal. Strength 5/5 in all 4.  Psychiatric: Awake alert, not confused able to community Labs on Admission: I have personally reviewed following labs and imaging studies  CBC: Recent Labs  Lab 09/13/17 2020 09/13/17 2030  WBC 8.0  --   NEUTROABS 5.6  --   HGB 9.0* 8.2*  HCT 27.4* 24.0*  MCV 95.5  --   PLT 165  --    Basic Metabolic Panel: Recent Labs  Lab 09/13/17 2030  NA 133*  K 4.2  CL 100*  GLUCOSE 131*  BUN 29*  CREATININE 1.90*   GFR: Estimated Creatinine Clearance: 21 mL/min (A) (by C-G formula based on SCr of 1.9 mg/dL (H)). Liver Function Tests: No results for input(s): AST, ALT, ALKPHOS, BILITOT, PROT, ALBUMIN in the last 168 hours. No results for input(s): LIPASE, AMYLASE in the last 168 hours. No results for input(s): AMMONIA in the last 168  hours. Coagulation Profile: No results for input(s): INR, PROTIME in the last 168 hours. Cardiac Enzymes: No results for input(s): CKTOTAL, CKMB, CKMBINDEX, TROPONINI in the last 168 hours. BNP (last 3 results) No results for input(s): PROBNP in the last 8760 hours. HbA1C: No results for input(s): HGBA1C in the last 72 hours. CBG: Recent Labs  Lab 09/13/17 2314  GLUCAP 142*   Lipid Profile: No results for input(s): CHOL, HDL, LDLCALC, TRIG, CHOLHDL, LDLDIRECT in the last 72 hours. Thyroid Function Tests: No results for input(s): TSH, T4TOTAL, FREET4, T3FREE, THYROIDAB in the last 72 hours. Anemia Panel: No results for input(s): VITAMINB12, FOLATE, FERRITIN, TIBC, IRON, RETICCTPCT in the last 72 hours. Urine analysis:    Component Value Date/Time   COLORURINE YELLOW 09/29/2016 0930   APPEARANCEUR CLEAR 09/29/2016 0930   LABSPEC 1.014 09/29/2016 0930   PHURINE 6.0 09/29/2016 0930   GLUCOSEU NEGATIVE 09/29/2016 0930   HGBUR MODERATE (A) 09/29/2016 0930   BILIRUBINUR NEGATIVE 09/29/2016 0930   BILIRUBINUR n 12/30/2010 1700   KETONESUR 5 (A) 09/29/2016 0930   PROTEINUR 100 (A) 09/29/2016 0930   UROBILINOGEN 1.0 09/25/2014 1550   NITRITE NEGATIVE 09/29/2016 0930   LEUKOCYTESUR LARGE (A) 09/29/2016 0930   Sepsis Labs: _0 (procalcitonin:4,lacticidven:4) )No results found for this or any previous visit (from the past 240 hour(s)).   Radiological Exams on Admission: Dg Chest 2 View  Result Date: 09/13/2017 CLINICAL DATA:  Cough and fever for 24 hours EXAM: CHEST - 2 VIEW COMPARISON:  February 02, 2017 FINDINGS: The heart size and mediastinal contours are stable. Heart size is enlarged. Possible patchy consolidation of the left mid lung and left lung base is noted. The right lung is clear. There is cephalization of flow. The visualized skeletal structures are stable. IMPRESSION: Cephalization of flow without frank pulmonary edema. Possible consolidation of the left mid lung and  left lung  base. Electronically Signed   By: Abelardo Diesel M.D.   On: 09/13/2017 21:09    EKG: Independently reviewed.  Atrial fibrillation with rate of 88.  Unchanged from previous EKG  Assessment/Plan Principal Problem:   Sepsis ([place]) Active Problems:   Essential hypertension   Type 2 diabetes mellitus ([place])   Pulmonary hypertension (HCC)   Chronic kidney disease (CKD), stage IV (severe) (HCC)   COPD with acute exacerbation (HCC)   Pneumonia of left lower lobe due to infectious organism [facility])   Chronic atrial fibrillation (HCC) CHADS2-VASc=6.  On Corrected "low" dose of Eliquis   CAP (community acquired pneumonia)   Pneumonia     #1 early sepsis: Patient had SIRS with evidence of pneumonia.  We will treat the pneumonia with supportive care including IV fluids.  Blood cultures will be obtained.  Monitor on telemetry bed  #2 pneumonia: Will be treated as community-acquired pneumonia.  Due to her age we might need speech therapy evaluation to rule out aspiration.  No evidence of dysphagia at this point.  Sputum cultures will be obtained.  Urine antigen streptococcal will be checked.  #3 COPD with no acute exacerbation at the moment.  Continue close treatment with nebulizer and antibiotics.  Patient on low-dose steroids and will continue  #4 atrial fibrillation: Rate is controlled at the moment.  She had RVR on arrival probably due to pneumonia.  Continue home regimen including Eliquis  #5 chronic kidney disease stage IV: Continue to monitor creatinine while hydrating patient.  #6 diabetes: Sliding scale insulin will be added to home regimen.  Check fasting blood sugar q. before meals and nightly.  #7 hypertension: Blood pressure appears reasonable at the moment.  No change in home therapy   DVT prophylaxis: Eliquis  Code Status: full  Family Communication: Husband and son at bedside  Disposition Plan: [place] called: None  Admission status: inpatient  Severity of  Illness: The appropriate patient status for this patient is INPATIENT. Inpatient status is judged to be reasonable and necessary in order to provide the required intensity of service to ensure the patient's safety. The patient's presenting symptoms, physical exam findings, and initial radiographic and laboratory data in the context of their chronic comorbidities is felt to place them at high risk for further clinical deterioration. Furthermore, it is not anticipated that the patient will be medically stable for discharge from the hospital within 2 midnights of admission. The following factors support the patient status of inpatient.   " The patient's presenting symptoms include shortness of breath. " The worrisome physical exam findings include hypoxemia " The initial radiographic and laboratory data are worrisome because of Hypoxemia. " The chronic co-morbidities include COPD with chronic atrial fibrillation  * I certify that at the point of admission it is my clinical judgment that the patient will require inpatient hospital care spanning beyond 2 midnights from the point of admission due to high intensity of service, high risk for further deterioration and high frequency of surveillance required.Barbette Merino MD [facility] Pager [phone]  If 7PM-7AM, please contact night-coverage [email or web address] Password [email or web address]  09/13/2017, 11:17 PM

## 2017-09-14 ENCOUNTER — Inpatient Hospital Stay (HOSPITAL_COMMUNITY): Payer: Medicare Other

## 2017-09-14 DIAGNOSIS — I340 Nonrheumatic mitral (valve) insufficiency: Secondary | ICD-10-CM

## 2017-09-14 LAB — CBC WITH DIFFERENTIAL/PLATELET
BASOS PCT: 0 %
Basophils Absolute: 0 10*3/uL (ref 0.0–0.1)
EOS ABS: 0 10*3/uL (ref 0.0–0.7)
Eosinophils Relative: 0 %
HCT: 25.6 % — ABNORMAL LOW (ref 36.0–46.0)
Hemoglobin: 8.4 g/dL — ABNORMAL LOW (ref 12.0–15.0)
Lymphocytes Relative: 10 %
Lymphs Abs: 0.7 10*3/uL (ref 0.7–4.0)
MCH: 31.3 pg (ref 26.0–34.0)
MCHC: 32.8 g/dL (ref 30.0–36.0)
MCV: 95.5 fL (ref 78.0–100.0)
MONO ABS: 0.7 10*3/uL (ref 0.1–1.0)
MONOS PCT: 12 %
Neutro Abs: 5 10*3/uL (ref 1.7–7.7)
Neutrophils Relative %: 78 %
Platelets: 155 10*3/uL (ref 150–400)
RBC: 2.68 MIL/uL — ABNORMAL LOW (ref 3.87–5.11)
RDW: 13.8 % (ref 11.5–15.5)
WBC: 6.4 10*3/uL (ref 4.0–10.5)

## 2017-09-14 LAB — COMPREHENSIVE METABOLIC PANEL
ALK PHOS: 58 U/L (ref 38–126)
ALT: 14 U/L (ref 14–54)
AST: 34 U/L (ref 15–41)
Albumin: 3.7 g/dL (ref 3.5–5.0)
Anion gap: 10 (ref 5–15)
BILIRUBIN TOTAL: 1.2 mg/dL (ref 0.3–1.2)
BUN: 33 mg/dL — AB (ref 6–20)
CALCIUM: 8.6 mg/dL — AB (ref 8.9–10.3)
CHLORIDE: 101 mmol/L (ref 101–111)
CO2: 22 mmol/L (ref 22–32)
CREATININE: 1.93 mg/dL — AB (ref 0.44–1.00)
GFR calc Af Amer: 27 mL/min — ABNORMAL LOW (ref >60)
GFR, EST NON AFRICAN AMERICAN: 23 mL/min — AB (ref >60)
Glucose, Bld: 196 mg/dL — ABNORMAL HIGH (ref 65–99)
Potassium: 4.7 mmol/L (ref 3.5–5.1)
Sodium: 133 mmol/L — ABNORMAL LOW (ref 135–145)
TOTAL PROTEIN: 7.8 g/dL (ref 6.5–8.1)

## 2017-09-14 LAB — ECHOCARDIOGRAM COMPLETE
Height: 66 in
Weight: 2384 oz

## 2017-09-14 LAB — GLUCOSE, CAPILLARY
GLUCOSE-CAPILLARY: 174 mg/dL — AB (ref 65–99)
GLUCOSE-CAPILLARY: 130 mg/dL — AB (ref 65–99)
Glucose-Capillary: 201 mg/dL — ABNORMAL HIGH (ref 65–99)
Glucose-Capillary: 124 mg/dL — ABNORMAL HIGH (ref 65–99)

## 2017-09-14 LAB — EXPECTORATED SPUTUM ASSESSMENT W REFEX TO RESP CULTURE

## 2017-09-14 LAB — EXPECTORATED SPUTUM ASSESSMENT W GRAM STAIN, RFLX TO RESP C

## 2017-09-14 MED ORDER — GLIMEPIRIDE 2 MG PO TABS
2.0000 mg | ORAL_TABLET | Freq: Every day | ORAL | Status: DC
  Filled 2017-09-14: qty 1

## 2017-09-14 MED ORDER — AMLODIPINE BESYLATE 5 MG PO TABS
5.0000 mg | ORAL_TABLET | Freq: Every day | ORAL | Status: DC
  Administered 2017-09-14 – 2017-09-17 (×3): 5 mg via ORAL
  Filled 2017-09-14 (×3): qty 1

## 2017-09-14 MED ORDER — HALOPERIDOL LACTATE 5 MG/ML IJ SOLN: 2.0000 mg | Freq: Four times a day (QID) | INTRAMUSCULAR | Status: DC | PRN

## 2017-09-14 MED ORDER — APIXABAN 2.5 MG PO TABS
2.5000 mg | ORAL_TABLET | Freq: Two times a day (BID) | ORAL | Status: DC
  Administered 2017-09-14 – 2017-09-15 (×3): 2.5 mg via ORAL
  Filled 2017-09-14 (×4): qty 1

## 2017-09-14 MED ORDER — LEVOTHYROXINE SODIUM 50 MCG PO TABS
50.0000 ug | ORAL_TABLET | Freq: Every day | ORAL | Status: DC
  Administered 2017-09-14 – 2017-09-17 (×3): 50 ug via ORAL
  Filled 2017-09-14 (×4): qty 1

## 2017-09-14 MED ORDER — ONDANSETRON HCL 4 MG/2ML IJ SOLN
4.0000 mg | INTRAMUSCULAR | Status: DC | PRN
  Administered 2017-09-14: 4 mg via INTRAVENOUS
  Filled 2017-09-14: qty 2

## 2017-09-14 MED ORDER — GABAPENTIN 100 MG PO CAPS
100.0000 mg | ORAL_CAPSULE | Freq: Three times a day (TID) | ORAL | Status: DC
  Administered 2017-09-14 – 2017-09-17 (×9): 100 mg via ORAL
  Filled 2017-09-14 (×9): qty 1

## 2017-09-14 MED ORDER — ONDANSETRON HCL 4 MG/2ML IJ SOLN
4.0000 mg | Freq: Four times a day (QID) | INTRAMUSCULAR | Status: DC | PRN
  Administered 2017-09-14: 4 mg via INTRAVENOUS
  Filled 2017-09-14: qty 2

## 2017-09-14 MED ORDER — GUAIFENESIN ER 600 MG PO TB12: 600.0000 mg | ORAL_TABLET | Freq: Two times a day (BID) | ORAL | Status: DC | PRN

## 2017-09-14 MED ORDER — NEBIVOLOL HCL 2.5 MG PO TABS
2.5000 mg | ORAL_TABLET | Freq: Every day | ORAL | Status: DC
  Administered 2017-09-14 – 2017-09-17 (×3): 2.5 mg via ORAL
  Filled 2017-09-14 (×4): qty 1

## 2017-09-14 MED ORDER — ENSURE ENLIVE PO LIQD
237.0000 mL | Freq: Two times a day (BID) | ORAL | Status: DC
  Administered 2017-09-17: 237 mL via ORAL

## 2017-09-14 MED ORDER — GUAIFENESIN-DM 100-10 MG/5ML PO SYRP
5.0000 mL | ORAL_SOLUTION | ORAL | Status: DC | PRN
  Administered 2017-09-14: 5 mL via ORAL
  Filled 2017-09-14: qty 10

## 2017-09-14 MED ORDER — DIAZEPAM 5 MG PO TABS: 5.0000 mg | ORAL_TABLET | Freq: Four times a day (QID) | ORAL | Status: DC | PRN

## 2017-09-14 MED ORDER — LORAZEPAM 2 MG/ML IJ SOLN: 0.5000 mg | Freq: Four times a day (QID) | INTRAMUSCULAR | Status: DC | PRN

## 2017-09-14 MED ORDER — FUROSEMIDE 20 MG PO TABS
20.0000 mg | ORAL_TABLET | Freq: Every day | ORAL | Status: DC
  Administered 2017-09-14: 20 mg via ORAL
  Filled 2017-09-14: qty 1

## 2017-09-14 MED ORDER — METOCLOPRAMIDE HCL 5 MG/ML IJ SOLN: 10.0000 mg | Freq: Four times a day (QID) | INTRAMUSCULAR | Status: DC | PRN

## 2017-09-14 MED ORDER — PREDNISONE 5 MG PO TABS
5.0000 mg | ORAL_TABLET | Freq: Every day | ORAL | Status: DC
  Administered 2017-09-14 – 2017-09-17 (×3): 5 mg via ORAL
  Filled 2017-09-14 (×4): qty 1

## 2017-09-14 MED ORDER — HYDROXYZINE HCL 10 MG PO TABS
10.0000 mg | ORAL_TABLET | Freq: Once | ORAL | Status: DC | PRN
  Filled 2017-09-14: qty 1

## 2017-09-14 MED ORDER — ACETAMINOPHEN 500 MG PO TABS: 500.0000 mg | ORAL_TABLET | Freq: Four times a day (QID) | ORAL | Status: DC | PRN

## 2017-09-14 NOTE — Evaluation (Signed)
Clinical/Bedside Swallow Evaluation Patient Details  Name: [name] [name] MRN: [id number] Date of Birth: [birth year]  Today's Date: 09/14/2017 Time: SLP Start Time (ACUTE ONLY): 1215 SLP Stop Time (ACUTE ONLY): 1228 SLP Time Calculation (min) (ACUTE ONLY): 13 min  Past Medical History:  Past Medical History:  Diagnosis Date  . Arthritis    r tkr  . Breast cancer ([place])   . Chronic atrial fibrillation (HCC)    CHADS2-VASc=6.  on low dose Eliquis (corrected for age & renal fxn)  . CVA (cerebral vascular accident) ([place]) 2008   mini stroke.no residual  . DEPRESSION 05/16/2009  . Diabetes mellitus type II   . Heart murmur    stress test 2009.dr peter [place]  . HYPERLIPIDEMIA 07/24/2008  . HYPERTENSION 07/24/2008  . HYPOTHYROIDISM 07/24/2008  . Intermittent vertigo   . Pneumonia   . Pulmonary hypertension ([place])    Mod PHTN on Echo 03/2016: 2/2 combination of Wegener's, HFPF & Afib.  . Skin abnormality    facial lesions .pt applying mupiracin to areas  . Vertigo   . 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 BREAST SURGERY  2007   Lumpectomy, XRT 2006.l breast  . CHOLECYSTECTOMY  1980  . EXPLORATORY LAPAROTOMY    . HEEL SPUR SURGERY Right   . JOINT REPLACEMENT     r knee  . KNEE SURGERY  2009   TKR  . TRANSTHORACIC ECHOCARDIOGRAM  03/2016   Afib (no Diastolic Fxn). EF 55-60%. No RWMA. Mild Aortic dilation Mild MR. Severe LA dilation. Mod RA dilation.  Mod TR with Mod-Severe Pulmonary HTN - but normal RV function..  . VENTRAL HERNIA REPAIR N/A 07/01/2016   Procedure: REPAIR OF INCARCERATED INCISIONAL HERNIA ;  Surgeon: Stark Klein, MD;  Location: [facility] ORS;  Service: General;  Laterality: N/A;  . VIDEO ASSISTED THORACOSCOPY  04/22/2011   Procedure: VIDEO ASSISTED THORACOSCOPY;  Surgeon: Melrose Nakayama, MD;  Location: [facility];  Service: Thoracic;  Laterality: Left;  WITH BIOPSY   HPI:  84 yo female adm  to [facility] with nausea coughing and was diagnosed with pneumonia.  Pt PMH + for TIA, COPD, breast cancer - s/p XRT - 10-11 years ago.  CXR 09/13/17 showed left mid and lower lung consolidation.   Swallow evaluation ordered, pt denies dysphagia.  Pt on oxygen at night - 2 liters.     Assessment / Plan / Recommendation Clinical Impression  Pt presents with functional oropharyngeal swallow ability without focal CN deficit *? minimal facial nerve deficit lower left facial asymmetry.  No indication of severe dysphagia noted during intake of sprite, saltine cracker nor applesauce.  Pt did cough x1 with cracker but has baseline cough due to pna and reports issues wtih secretions.  Did not conduct 3 ounce water test due to pt's nausea.  Pt denies h/o dysphagia sincer her radiation tx approximately 10-11 years ago.  Recommend continue diet as tolerated with general aspiration/esophageal precautions.  Informed pt to be mindful of work of breathing with intake and allow frequent rest breaks prn.    No follow up indicated. Thanks for this consult. SLP Visit Diagnosis: Dysphagia, unspecified (R13.10)    Aspiration Risk  No limitations    Diet Recommendation Regular;Thin liquid   Liquid Administration via: Cup;Straw Medication Administration: Whole meds with liquid Supervision: Patient able to self feed Compensations: Slow rate;Small sips/bites    Other  Recommendations Oral Care Recommendations: Oral care BID  Follow up Recommendations None      Frequency and Duration            Prognosis        Swallow Study   General Date of Onset: 09/14/17 HPI: 84 yo female adm to [facility] with nausea coughing and was diagnosed with pneumonia.  Pt PMH + for TIA, COPD, breast cancer - s/p XRT - 10-11 years ago.  CXR 09/13/17 showed left mid and lower lung consolidation.   Swallow evaluation ordered, pt denies dysphagia.  Pt on oxygen at night - 2 liters.   Type of Study: Bedside Swallow Evaluation Diet Prior to this  Study: Regular;Thin liquids Temperature Spikes Noted: No Respiratory Status: Nasal cannula History of Recent Intubation: No Behavior/Cognition: Alert;Cooperative;Pleasant mood Oral Cavity Assessment: Within Functional Limits Oral Care Completed by SLP: No Oral Cavity - Dentition: Adequate natural dentition Vision: Functional for self-feeding Self-Feeding Abilities: Able to feed self Patient Positioning: Upright in bed Baseline Vocal Quality: Hoarse(because of coughing) Volitional Cough: Strong Volitional Swallow: Able to elicit    Oral/Motor/Sensory Function Overall Oral Motor/Sensory Function: Within functional limits(? minimal left lower facial asymmetry)   Ice Chips Ice chips: Not tested   Thin Liquid Thin Liquid: Within functional limits Presentation: Self Fed;Straw Other Comments: did not test 3 ounce water test due to pt's nausea    Nectar Thick Nectar Thick Liquid: Not tested   Honey Thick Honey Thick Liquid: Not tested   Puree Puree: Within functional limits Presentation: Self Fed;Spoon   Solid   GO   Solid: Within functional limits Presentation: Self Fed Other Comments: delayed cough x1 of 4 boluses - pt attributes to secretions retained in pharynx        Macario Golds 09/14/2017,12:41 PM

## 2017-09-14 NOTE — Progress Notes (Signed)
Patient ID: STAR RESLER, female   DOB: [birth year], 84 y.o.   MRN: [id number]  PROGRESS NOTE    BRION HEDGES  SNK:[id number] DOB: 06-Mar-1933 DOA: 09/13/2017 PCP: [name], MD   Brief Narrative:  84 year old female with history of dementia, chronic atrial for ablation, diabetes, hyperlipidemia, breast cancer and previous CVA, chronic kidney disease stage III, COPD, chronic hypoxic respiratory failure on home oxygen at night presented with shortness of breath on 09/13/2017.  She was admitted with pneumonia and started on IV antibiotics.   Assessment & Plan:   Principal Problem:   Sepsis ([place]) Active Problems:   Essential hypertension   Type 2 diabetes mellitus ([place])   Pulmonary hypertension (HCC)   Chronic kidney disease (CKD), stage IV (severe) (HCC)   COPD with acute exacerbation (HCC)   Pneumonia of left lower lobe due to infectious organism [facility])   Chronic atrial fibrillation ([place]) CHADS2-VASc=6.  On Corrected "low" dose of Eliquis   CAP (community acquired pneumonia)   Pneumonia  Sepsis -Probably secondary to pneumonia. -Currently hemodynamically stable.  Continue antibiotics.  Continue IV fluids.  Follow cultures  Probable left lobar community-acquired bacterial pneumonia -Continue Rocephin and Zithromax.  Follow cultures.  SLP evaluation. -Follow urine Legionella and streptococcal antigen.  If respiratory status worsens, might need CT scan of the chest  COPD -No signs of exacerbation at this moment -Continue nebulizers and antibiotics.  Continue low-dose home steroids  Chronic kidney disease stage IV -Monitor creatinine.  Mild elevation on admission  Chronic atrial fibrillation -Only mildly tachycardic.  Continue Eliquis and nebivolol  Diabetes mellitus type 2 -Continue Accu-Cheks with coverage.  Will hold oral medication while inpatient.  Hypertension -Monitor blood pressure.  Continue Lasix, nebivolol and amlodipine  DVT prophylaxis: Eliquis Code  Status: Full Family Communication: None at bedside Disposition Plan: Probable home in 1 to 3 days pending clinical improvement  Consultants: None  Procedures: None  Antimicrobials: Rocephin and Zithromax from 09/13/2017   Subjective: Patient seen and examined at bedside.  She does not feel well, currently is nauseous and had intermittent vomiting.  No current chest pain, worsening shortness of breath, abdominal pain or diarrhea.  Objective: Vitals:   09/13/17 2200 09/13/17 2233 09/13/17 2315 09/14/17 0455  BP: (!) 102/53  122/78 (!) 150/81  Pulse: (!) 104 91 79 (!) 105  Resp: (!) 32 _0 Temp:   98.3 F (36.8 C) 98.7 F (37.1 C)  TempSrc:   Oral Oral  SpO2: (!) 89% 98% 99% 100%  Weight:      Height:        Intake/Output Summary (Last 24 hours) at 09/14/2017 1021 Last data filed at 09/14/2017 0408 Gross per 24 hour  Intake 450 ml  Output -  Net 450 ml   Filed Weights   09/13/17 1943  Weight: 67.6 kg (149 lb)    Examination:  General exam: Appears in mild distress secondary to nausea.  Alert and answering questions Respiratory system: Bilateral decreased breath sound at bases Cardiovascular system: S1 & S2 heard, mildly tachycardic  gastrointestinal system: Abdomen is nondistended, soft and nontender. Normal bowel sounds heard. Extremities: No cyanosis, clubbing; trace edema    Data Reviewed: I have personally reviewed following labs and imaging studies  CBC: Recent Labs  Lab 09/13/17 2020 09/13/17 2030 09/13/17 2339  WBC 8.0  --  6.4  NEUTROABS 5.6  --  5.0  HGB 9.0* 8.2* 8.4*  HCT 27.4* 24.0* 25.6*  MCV 95.5  --  95.5  PLT 165  --  867   Basic Metabolic Panel: Recent Labs  Lab 09/13/17 2030 09/14/17 0508  NA 133* 133*  K 4.2 4.7  CL 100* 101  CO2  --  22  GLUCOSE 131* 196*  BUN 29* 33*  CREATININE 1.90* 1.93*  CALCIUM  --  8.6*   GFR: Estimated Creatinine Clearance: 20.7 mL/min (A) (by C-G formula based on SCr of 1.93 mg/dL (H)). Liver  Function Tests: Recent Labs  Lab 09/14/17 0508  AST 34  ALT 14  ALKPHOS 58  BILITOT 1.2  PROT 7.8  ALBUMIN 3.7   No results for input(s): LIPASE, AMYLASE in the last 168 hours. No results for input(s): AMMONIA in the last 168 hours. Coagulation Profile: No results for input(s): INR, PROTIME in the last 168 hours. Cardiac Enzymes: No results for input(s): CKTOTAL, CKMB, CKMBINDEX, TROPONINI in the last 168 hours. BNP (last 3 results) No results for input(s): PROBNP in the last 8760 hours. HbA1C: No results for input(s): HGBA1C in the last 72 hours. CBG: Recent Labs  Lab 09/13/17 2314 09/14/17 0746  GLUCAP 142* 201*   Lipid Profile: No results for input(s): CHOL, HDL, LDLCALC, TRIG, CHOLHDL, LDLDIRECT in the last 72 hours. Thyroid Function Tests: No results for input(s): TSH, T4TOTAL, FREET4, T3FREE, THYROIDAB in the last 72 hours. Anemia Panel: No results for input(s): VITAMINB12, FOLATE, FERRITIN, TIBC, IRON, RETICCTPCT in the last 72 hours. Sepsis Labs: Recent Labs  Lab 09/13/17 2032  LATICACIDVEN 1.62    Recent Results (from the past 240 hour(s))  Culture, sputum-assessment     Status: None   Collection Time: 09/13/17 11:26 PM  Result Value Ref Range Status   Specimen Description SPUTUM  Final   Special Requests NONE  Final   Sputum evaluation   Final    THIS SPECIMEN IS ACCEPTABLE FOR SPUTUM CULTURE Performed at [facility], [place] [address], [place], [place] [address]    Report Status 09/14/2017 FINAL  Final         Radiology Studies: Dg Chest 2 View  Result Date: 09/13/2017 CLINICAL DATA:  Cough and fever for 24 hours EXAM: CHEST - 2 VIEW COMPARISON:  February 02, 2017 FINDINGS: The heart size and mediastinal contours are stable. Heart size is enlarged. Possible patchy consolidation of the left mid lung and left lung base is noted. The right lung is clear. There is cephalization of flow. The visualized skeletal structures are stable.  IMPRESSION: Cephalization of flow without frank pulmonary edema. Possible consolidation of the left mid lung and left lung base. Electronically Signed   By: Abelardo Diesel M.D.   On: 09/13/2017 21:09        Scheduled Meds: . amLODipine  5 mg Oral Daily  . apixaban  2.5 mg Oral BID  . feeding supplement (ENSURE ENLIVE)  237 mL Oral BID BM  . furosemide  20 mg Oral Daily  . gabapentin  100 mg Oral TID  . glimepiride  2 mg Oral Q breakfast  . insulin aspart  0-9 Units Subcutaneous TID WC  . levothyroxine  50 mcg Oral QAC breakfast  . nebivolol  2.5 mg Oral Daily  . predniSONE  5 mg Oral Q breakfast   Continuous Infusions: . sodium chloride 75 mL/hr at 09/14/17 0408  . azithromycin    . cefTRIAXone (ROCEPHIN)  IV       LOS: 1 day        Aline August, MD [facility] Pager [phone]  If 7PM-7AM, please contact night-coverage [email or web address] Password [email or web address] 09/14/2017, 10:21 AM

## 2017-09-14 NOTE — Progress Notes (Signed)
Initial Nutrition Assessment  DOCUMENTATION CODES:   Not applicable  INTERVENTION:   High Protein Snacks BID between meals  NUTRITION DIAGNOSIS:   Increased nutrient needs related to acute illness as evidenced by estimated needs.  GOAL:   Patient will meet greater than or equal to 90% of their needs  MONITOR:   Supplement acceptance, Weight trends, Labs, PO intake  REASON FOR ASSESSMENT:   Malnutrition Screening Tool    ASSESSMENT:   Patient with PMH significant for dementia, DM, HLD, COPD, breast cancer, previous CVA, and CKD III. Presents this admission with shortness of breath and cough. Found to have PNA.    Pt denies any loss in appetite PTA. She reports living with her husband who has dementia. They typically eat two meals per day that she prepares. Meals usually consist of a meat, vegetable, and grain. They eat a small container of ice cream every night. RD observed breakfast tray at beside with toast and apple sauce. Pt could only finish 50% as she has struggled with nausea this morning. Nurse at bedside aware. Pt does not like supplements but is willing to have snacks.   Pt endorses a UBW of 145 lb and that she began losing weight when she became the sole caretaker of her husband months ago. Records indicate that pt weighed 167 lb 09/29/16 and 149 lb this admission (10.7% wt loss in one year, insignificant for time frame). Nutrition-Focused physical exam completed. If wt loss continues pt is at high risk for becoming malnourished.   Medications reviewed and include: 20 mg lasix once/day, prednisone Labs reviewed: Na 133 (L) calcium ionized 1.12 (L)  NUTRITION - FOCUSED PHYSICAL EXAM:    Most Recent Value  Orbital Region  No depletion  Upper Arm Region  Moderate depletion  Thoracic and Lumbar Region  Unable to assess  Buccal Region  No depletion  Temple Region  Moderate depletion  Clavicle Bone Region  Severe depletion  Clavicle and Acromion Bone Region  Severe  depletion  Scapular Bone Region  Unable to assess  Dorsal Hand  Mild depletion  Patellar Region  Moderate depletion  Anterior Thigh Region  Moderate depletion  Posterior Calf Region  Moderate depletion  Edema (RD Assessment)  None  Hair  Reviewed  Eyes  Reviewed  Mouth  Reviewed  Skin  Reviewed  Nails  Reviewed     Diet Order:   Diet Order           Diet Carb Modified Fluid consistency: Thin; Room service appropriate? Yes  Diet effective now          EDUCATION NEEDS:   Education needs have been addressed  Skin:  Skin Assessment: Reviewed RN Assessment  Last BM:  09/13/17  Height:   Ht Readings from Last 1 Encounters:  09/13/17 _0  (1.676 m)    Weight:   Wt Readings from Last 1 Encounters:  09/13/17 149 lb (67.6 kg)    Ideal Body Weight:  59.1 kg  BMI:  Body mass index is 24.05 kg/m.  Estimated Nutritional Needs:   Kcal:  1550-1750 kcal  Protein:  75-85 g  Fluid:  >1.5 L/day    Mariana Single RD, LDN Clinical Nutrition Pager # [phone]

## 2017-09-14 NOTE — Progress Notes (Signed)
This shift pt began to have coughing spell with nausea. Robitussin and zofran administered. Pt req breathing treatment, hospitalist notified x2 with no response.  Pt placed in chair and fluids decreased per order, will continue to monitor

## 2017-09-14 NOTE — Progress Notes (Signed)
  Echocardiogram 2D Echocardiogram has been performed.  Merrie Roof F 09/14/2017, 3:16 PM

## 2017-09-14 NOTE — Progress Notes (Signed)
Pt arrived to unit via stretcher to room 1501. alert and oriented x4 , general weakness 0/10 pain. VS taken, callbell within reach. Pt guide at the bedside. Initial assessment completed. Will continue to monitor.

## 2017-09-15 ENCOUNTER — Inpatient Hospital Stay (HOSPITAL_COMMUNITY): Payer: Medicare Other

## 2017-09-15 DIAGNOSIS — R931 Abnormal findings on diagnostic imaging of heart and coronary circulation: Secondary | ICD-10-CM

## 2017-09-15 LAB — COMPREHENSIVE METABOLIC PANEL
ALK PHOS: 49 U/L (ref 38–126)
ALT: 12 U/L — ABNORMAL LOW (ref 14–54)
ANION GAP: 9 (ref 5–15)
AST: 23 U/L (ref 15–41)
Albumin: 3.3 g/dL — ABNORMAL LOW (ref 3.5–5.0)
BILIRUBIN TOTAL: 0.7 mg/dL (ref 0.3–1.2)
BUN: 33 mg/dL — ABNORMAL HIGH (ref 6–20)
CALCIUM: 8.4 mg/dL — AB (ref 8.9–10.3)
CO2: 22 mmol/L (ref 22–32)
Chloride: 104 mmol/L (ref 101–111)
Creatinine, Ser: 2.05 mg/dL — ABNORMAL HIGH (ref 0.44–1.00)
GFR calc non Af Amer: 21 mL/min — ABNORMAL LOW (ref >60)
GFR, EST AFRICAN AMERICAN: 25 mL/min — AB (ref >60)
Glucose, Bld: 123 mg/dL — ABNORMAL HIGH (ref 65–99)
Potassium: 4.5 mmol/L (ref 3.5–5.1)
SODIUM: 135 mmol/L (ref 135–145)
TOTAL PROTEIN: 6.9 g/dL (ref 6.5–8.1)

## 2017-09-15 LAB — CBC WITH DIFFERENTIAL/PLATELET
Basophils Absolute: 0 K/uL (ref 0.0–0.1)
Basophils Relative: 0 %
Eosinophils Absolute: 0.1 K/uL (ref 0.0–0.7)
Eosinophils Relative: 1 %
HCT: 26.8 % — ABNORMAL LOW (ref 36.0–46.0)
Hemoglobin: 8.5 g/dL — ABNORMAL LOW (ref 12.0–15.0)
Lymphocytes Relative: 15 %
Lymphs Abs: 1.1 K/uL (ref 0.7–4.0)
MCH: 29.9 pg (ref 26.0–34.0)
MCHC: 31.7 g/dL (ref 30.0–36.0)
MCV: 94.4 fL (ref 78.0–100.0)
Monocytes Absolute: 1 K/uL (ref 0.1–1.0)
Monocytes Relative: 14 %
Neutro Abs: 5.2 K/uL (ref 1.7–7.7)
Neutrophils Relative %: 70 %
Platelets: 125 K/uL — ABNORMAL LOW (ref 150–400)
RBC: 2.84 MIL/uL — ABNORMAL LOW (ref 3.87–5.11)
RDW: 13.9 % (ref 11.5–15.5)
WBC: 7.4 K/uL (ref 4.0–10.5)

## 2017-09-15 LAB — GLUCOSE, CAPILLARY
GLUCOSE-CAPILLARY: 137 mg/dL — AB (ref 65–99)
GLUCOSE-CAPILLARY: 190 mg/dL — AB (ref 65–99)
Glucose-Capillary: 99 mg/dL (ref 65–99)
Glucose-Capillary: 138 mg/dL — ABNORMAL HIGH (ref 65–99)

## 2017-09-15 LAB — HIV ANTIBODY (ROUTINE TESTING W REFLEX): HIV Screen 4th Generation wRfx: NONREACTIVE

## 2017-09-15 LAB — MAGNESIUM: Magnesium: 2.1 mg/dL (ref 1.7–2.4)

## 2017-09-15 LAB — LIPASE, BLOOD: Lipase: 34 U/L (ref 11–51)

## 2017-09-15 NOTE — Progress Notes (Signed)
[facility] to pick patient up at 230 PM, to arrive at [facility] at 3 PM.   Confirmed with endoscopy at [facility] that patient is on the schedule.

## 2017-09-15 NOTE — Progress Notes (Signed)
[facility] endoscopy called to say that patient's time had been moved up to 11 am.  So endoscopy called [facility], and arranged for updated time.  [facility] to pick patient up at 0930.  [facility] paperwork on front of chart.

## 2017-09-15 NOTE — Progress Notes (Signed)
Patient ID: [name] [name], female   DOB: [birth year], 84 y.o.   MRN: [id number]  PROGRESS NOTE    HELEENA MICELI  YQM:[id number] DOB: February 15, 1933 DOA: 09/13/2017 PCP: [name], MD   Brief Narrative:  84 year old female with history of dementia, chronic atrial for ablation, diabetes, hyperlipidemia, breast cancer and previous CVA, chronic kidney disease stage III, COPD, chronic hypoxic respiratory failure on home oxygen at night presented with shortness of breath on 09/13/2017.  She was admitted with pneumonia and started on IV antibiotics.   Assessment & Plan:   Principal Problem:   Sepsis ([place]) Active Problems:   Essential hypertension   Type 2 diabetes mellitus ([place])   Pulmonary hypertension (HCC)   Chronic kidney disease (CKD), stage IV (severe) (HCC)   COPD with acute exacerbation (HCC)   Pneumonia of left lower lobe due to infectious organism [facility])   Chronic atrial fibrillation ([place]) CHADS2-VASc=6.  On Corrected "low" dose of Eliquis   CAP (community acquired pneumonia)   Pneumonia  Sepsis -Probably secondary to pneumonia. -Currently hemodynamically stable.  Continue antibiotics.  Discontinue IV fluids.  Cultures negative so far  Probable left lobar community-acquired bacterial pneumonia -Continue Rocephin and Zithromax.   -Respiratory status stable currently.    Abnormal echo -Echo showed highly mobile filamentous echodensity in the right atrium, unclear if it is a tricuspid valve vegetation.  I have contacted cardiology team for probable TEE.  TEE will be on Thursday or Friday depending on cardiology schedule.  Will not broaden the antibiotic coverage for now unless patient has positive blood cultures.  COPD with chronic hypoxic respiratory failure -No signs of exacerbation at this moment -Continue nebulizers and antibiotics.  Continue low-dose home steroids -Continue oxygen.  Incentive spirometry  Chronic kidney disease stage IV -Slight elevation of creatinine.   Monitor.  DC Lasix for now  Chronic atrial fibrillation -Rate controlled.  Continue Eliquis and nebivolol  Diabetes mellitus type 2 -Continue Accu-Cheks with coverage.  Will hold oral medication while inpatient.  Hypertension -Monitor blood pressure.  Continue nebivolol and amlodipine.  Hold Lasix    DVT prophylaxis: Eliquis Code Status: Full Family Communication: None at bedside Disposition Plan: Probable home in 1 to 3 days pending clinical improvement  Consultants: None  Procedures: None  Antimicrobials: Rocephin and Zithromax from 09/13/2017   Subjective: Patient seen and examined at bedside.  She feels better.  Breathing better.  No overnight fever, nausea or vomiting.  No current chest pain  Objective: Vitals:   09/14/17 0455 09/14/17 1418 09/14/17 2011 09/15/17 0300  BP: (!) 150/81 122/69 127/87 (!) 141/75  Pulse: (!) 105 69 81 78  Resp: 15 (!) 24 17 (!) 30  Temp: 98.7 F (37.1 C) 98.8 F (37.1 C) 99.3 F (37.4 C) 99.3 F (37.4 C)  TempSrc: Oral Oral Oral Oral  SpO2: 100% 99% 98% 95%  Weight:      Height:        Intake/Output Summary (Last 24 hours) at 09/15/2017 1008 Last data filed at 09/15/2017 0600 Gross per 24 hour  Intake 1165 ml  Output -  Net 1165 ml   Filed Weights   09/13/17 1943  Weight: 67.6 kg (149 lb)    Examination:  General exam: No acute distress. Respiratory system: Bilateral decreased breath sound at bases with some scattered crackles Cardiovascular system: S1 & S2 heard, rate controlled gastrointestinal system: Abdomen is nondistended, soft and nontender. Normal bowel sounds heard. Extremities: No cyanosis; trace edema    Data Reviewed:  I have personally reviewed following labs and imaging studies  CBC: Recent Labs  Lab 09/13/17 2020 09/13/17 2030 09/13/17 2339 09/15/17 0526  WBC 8.0  --  6.4 7.4  NEUTROABS 5.6  --  5.0 5.2  HGB 9.0* 8.2* 8.4* 8.5*  HCT 27.4* 24.0* 25.6* 26.8*  MCV 95.5  --  95.5 94.4  PLT 165  --   155 944*   Basic Metabolic Panel: Recent Labs  Lab 09/13/17 2030 09/14/17 0508 09/15/17 0526  NA 133* 133* 135  K 4.2 4.7 4.5  CL 100* 101 104  CO2  --  22 22  GLUCOSE 131* 196* 123*  BUN 29* 33* 33*  CREATININE 1.90* 1.93* 2.05*  CALCIUM  --  8.6* 8.4*  MG  --   --  2.1   GFR: Estimated Creatinine Clearance: 19.5 mL/min (A) (by C-G formula based on SCr of 2.05 mg/dL (H)). Liver Function Tests: Recent Labs  Lab 09/14/17 0508 09/15/17 0526  AST 34 23  ALT 14 12*  ALKPHOS 58 49  BILITOT 1.2 0.7  PROT 7.8 6.9  ALBUMIN 3.7 3.3*   Recent Labs  Lab 09/15/17 0526  LIPASE 34   No results for input(s): AMMONIA in the last 168 hours. Coagulation Profile: No results for input(s): INR, PROTIME in the last 168 hours. Cardiac Enzymes: No results for input(s): CKTOTAL, CKMB, CKMBINDEX, TROPONINI in the last 168 hours. BNP (last 3 results) No results for input(s): PROBNP in the last 8760 hours. HbA1C: No results for input(s): HGBA1C in the last 72 hours. CBG: Recent Labs  Lab 09/13/17 2314 09/14/17 0746 09/14/17 1141 09/14/17 1616 09/14/17 2150  GLUCAP 142* 201* 124* 174* 130*   Lipid Profile: No results for input(s): CHOL, HDL, LDLCALC, TRIG, CHOLHDL, LDLDIRECT in the last 72 hours. Thyroid Function Tests: No results for input(s): TSH, T4TOTAL, FREET4, T3FREE, THYROIDAB in the last 72 hours. Anemia Panel: No results for input(s): VITAMINB12, FOLATE, FERRITIN, TIBC, IRON, RETICCTPCT in the last 72 hours. Sepsis Labs: Recent Labs  Lab 09/13/17 2032  LATICACIDVEN 1.62    Recent Results (from the past 240 hour(s))  Culture, sputum-assessment     Status: None   Collection Time: 09/13/17 11:26 PM  Result Value Ref Range Status   Specimen Description SPUTUM  Final   Special Requests NONE  Final   Sputum evaluation   Final    THIS SPECIMEN IS ACCEPTABLE FOR SPUTUM CULTURE Performed at [facility], [place] [address], [place], [place] [address]      Report Status 09/14/2017 FINAL  Final  Culture, respiratory (NON-Expectorated)     Status: None (Preliminary result)   Collection Time: 09/13/17 11:26 PM  Result Value Ref Range Status   Specimen Description   Final    SPUTUM Performed at [place] [address], [place], [place] [address]    Special Requests   Final    NONE Reflexed from [id number] [id number] Performed at [facility], [place] [address], [place], [place] [address]    Gram Stain   Final    FEW WBC PRESENT, PREDOMINANTLY PMN RARE EPITHELIAL CELLS SEEN FEW GRAM POSITIVE COCCI IN PAIRS IN CHAINS RARE GRAM VARIABLE ROD    Culture   Final    CULTURE REINCUBATED FOR BETTER GROWTH Performed at [facility], [place] [address] [place], [place] [address]    Report Status PENDING  Incomplete         Radiology Studies: Dg Chest 2 View  Result  Date: 09/15/2017 CLINICAL DATA:  Shortness of breath in a patient with a history of breast cancer and atrial fibrillation. EXAM: CHEST - 2 VIEW COMPARISON:  PA and lateral chest 09/13/2017 and 02/02/2017. FINDINGS: Marked cardiomegaly is again seen. Airspace disease is seen in the left upper lobe which is new since the most recent study. No focal airspace disease on the right. The lungs appear emphysematous. Atherosclerosis noted. IMPRESSION: New left upper lobe airspace disease worrisome for pneumonia. Marked cardiomegaly without edema. Atherosclerosis. Electronically Signed   By: Inge Rise M.D.   On: 09/15/2017 09:17   Dg Chest 2 View  Result Date: 09/13/2017 CLINICAL DATA:  Cough and fever for 24 hours EXAM: CHEST - 2 VIEW COMPARISON:  February 02, 2017 FINDINGS: The heart size and mediastinal contours are stable. Heart size is enlarged. Possible patchy consolidation of the left mid lung and left lung base is noted. The right lung is clear. There is cephalization of flow. The visualized skeletal structures are stable. IMPRESSION: Cephalization of  flow without frank pulmonary edema. Possible consolidation of the left mid lung and left lung base. Electronically Signed   By: Abelardo Diesel M.D.   On: 09/13/2017 21:09        Scheduled Meds: . amLODipine  5 mg Oral Daily  . apixaban  2.5 mg Oral BID  . feeding supplement (ENSURE ENLIVE)  237 mL Oral BID BM  . gabapentin  100 mg Oral TID  . insulin aspart  0-9 Units Subcutaneous TID WC  . levothyroxine  50 mcg Oral QAC breakfast  . nebivolol  2.5 mg Oral Daily  . predniSONE  5 mg Oral Q breakfast   Continuous Infusions: . sodium chloride 75 mL/hr at 09/15/17 0504  . azithromycin 500 mg (09/14/17 2126)  . cefTRIAXone (ROCEPHIN)  IV 1 g (09/14/17 2126)     LOS: 2 days        Aline August, MD [facility] Pager [phone]  If 7PM-7AM, please contact night-coverage [email or web address] Password [email or web address] 09/15/2017, 10:08 AM

## 2017-09-15 NOTE — Progress Notes (Addendum)
[facility] has been requested to perform a transesophageal echocardiogram on [name] [name] for highly mobile filaments in the atrium on echocardiogram 09/14/17 with concern for vegetation versus Chiari network.  After careful review of history and examination, the risks and benefits of transesophageal echocardiogram have been explained including risks of esophageal damage, perforation (1:10,000 risk), bleeding, pharyngeal hematoma as well as other potential complications associated with conscious sedation including aspiration, arrhythmia, respiratory failure and death. Alternatives to treatment were discussed, questions were answered. Patient is wanting to think about the procedure and talk it over with family prior to consenting. My personal contact information was left with RN to answer further questions. Pager number below. She is tentatively scheduled for tomorrow, 09/16/17 at 4 pm. Please let Cardiology service know whether to keep this scheduled or cancel.   Kathyrn Drown, NP  09/15/2017 10:17 AM  [phone]  Addendum: Spoke with nursing staff who states that the pt is now agreeable to TEE tomorrow scheduled for 09/16/17 at 4pm with Dr. Aundra Dubin. RN staff will need to set up transport with [facility] for pt to come to [facility] 1 hour prior to procedure.   Kathyrn Drown NP-C [place] Pager: [phone]

## 2017-09-16 ENCOUNTER — Encounter (HOSPITAL_COMMUNITY)
Admission: EM | Disposition: A | Discharge status: Home Health | Payer: Self-pay | Source: Home / Self Care | Attending: Internal Medicine

## 2017-09-16 ENCOUNTER — Encounter (HOSPITAL_COMMUNITY): Payer: Self-pay

## 2017-09-16 ENCOUNTER — Inpatient Hospital Stay (HOSPITAL_COMMUNITY): Payer: Medicare Other

## 2017-09-16 DIAGNOSIS — I5023 Acute on chronic systolic (congestive) heart failure: Secondary | ICD-10-CM

## 2017-09-16 DIAGNOSIS — A419 Sepsis, unspecified organism: Principal | ICD-10-CM

## 2017-09-16 DIAGNOSIS — J181 Lobar pneumonia, unspecified organism: Secondary | ICD-10-CM

## 2017-09-16 DIAGNOSIS — I313 Pericardial effusion (noninflammatory): Secondary | ICD-10-CM

## 2017-09-16 DIAGNOSIS — M3130 Wegener's granulomatosis without renal involvement: Secondary | ICD-10-CM

## 2017-09-16 DIAGNOSIS — Q249 Congenital malformation of heart, unspecified: Secondary | ICD-10-CM

## 2017-09-16 DIAGNOSIS — I482 Chronic atrial fibrillation: Secondary | ICD-10-CM

## 2017-09-16 DIAGNOSIS — Q248 Other specified congenital malformations of heart: Secondary | ICD-10-CM

## 2017-09-16 DIAGNOSIS — I340 Nonrheumatic mitral (valve) insufficiency: Secondary | ICD-10-CM

## 2017-09-16 DIAGNOSIS — N179 Acute kidney failure, unspecified: Secondary | ICD-10-CM

## 2017-09-16 DIAGNOSIS — I10 Essential (primary) hypertension: Secondary | ICD-10-CM

## 2017-09-16 DIAGNOSIS — N184 Chronic kidney disease, stage 4 (severe): Secondary | ICD-10-CM

## 2017-09-16 HISTORY — PX: TEE WITHOUT CARDIOVERSION: SHX5443

## 2017-09-16 LAB — GLUCOSE, CAPILLARY
GLUCOSE-CAPILLARY: 166 mg/dL — AB (ref 65–99)
Glucose-Capillary: 105 mg/dL — ABNORMAL HIGH (ref 65–99)
Glucose-Capillary: 112 mg/dL — ABNORMAL HIGH (ref 65–99)

## 2017-09-16 LAB — CBC WITH DIFFERENTIAL/PLATELET
BASOS ABS: 0 10*3/uL (ref 0.0–0.1)
Basophils Relative: 0 %
Eosinophils Absolute: 0.2 10*3/uL (ref 0.0–0.7)
Eosinophils Relative: 5 %
HEMATOCRIT: 26.5 % — AB (ref 36.0–46.0)
Hemoglobin: 8.4 g/dL — ABNORMAL LOW (ref 12.0–15.0)
Lymphocytes Relative: 23 %
Lymphs Abs: 1.2 10*3/uL (ref 0.7–4.0)
MCH: 30.7 pg (ref 26.0–34.0)
MCHC: 31.7 g/dL (ref 30.0–36.0)
MCV: 96.7 fL (ref 78.0–100.0)
Monocytes Absolute: 0.7 10*3/uL (ref 0.1–1.0)
Monocytes Relative: 14 %
NEUTROS ABS: 3 10*3/uL (ref 1.7–7.7)
NEUTROS PCT: 58 %
Platelets: 168 10*3/uL (ref 150–400)
RBC: 2.74 MIL/uL — AB (ref 3.87–5.11)
RDW: 13.8 % (ref 11.5–15.5)
WBC: 5.1 10*3/uL (ref 4.0–10.5)

## 2017-09-16 LAB — BASIC METABOLIC PANEL
Anion gap: 9 (ref 5–15)
BUN: 37 mg/dL — ABNORMAL HIGH (ref 6–20)
CO2: 24 mmol/L (ref 22–32)
Calcium: 8.6 mg/dL — ABNORMAL LOW (ref 8.9–10.3)
Chloride: 103 mmol/L (ref 101–111)
Creatinine, Ser: 2.11 mg/dL — ABNORMAL HIGH (ref 0.44–1.00)
GFR calc Af Amer: 24 mL/min — ABNORMAL LOW (ref >60)
GFR, EST NON AFRICAN AMERICAN: 21 mL/min — AB (ref >60)
GLUCOSE: 113 mg/dL — AB (ref 65–99)
POTASSIUM: 4.3 mmol/L (ref 3.5–5.1)
Sodium: 136 mmol/L (ref 135–145)

## 2017-09-16 LAB — CULTURE, RESPIRATORY: CULTURE: NORMAL

## 2017-09-16 LAB — PROTIME-INR
INR: 1.31
PROTHROMBIN TIME: 16.2 s — AB (ref 11.4–15.2)

## 2017-09-16 LAB — C-REACTIVE PROTEIN: CRP: 4.5 mg/dL — AB (ref <1.0)

## 2017-09-16 LAB — MAGNESIUM: Magnesium: 2.2 mg/dL (ref 1.7–2.4)

## 2017-09-16 LAB — CULTURE, RESPIRATORY W GRAM STAIN

## 2017-09-16 SURGERY — ECHOCARDIOGRAM, TRANSESOPHAGEAL
Anesthesia: Moderate Sedation

## 2017-09-16 MED ORDER — SODIUM CHLORIDE 0.9 % IV BOLUS
500.0000 mL | Freq: Once | INTRAVENOUS | Status: AC
  Administered 2017-09-16: 500 mL via INTRAVENOUS

## 2017-09-16 MED ORDER — FENTANYL CITRATE (PF) 100 MCG/2ML IJ SOLN
INTRAMUSCULAR | Status: AC
Start: 2017-09-16 — End: ?
  Filled 2017-09-16: qty 2

## 2017-09-16 MED ORDER — BUTAMBEN-TETRACAINE-BENZOCAINE 2-2-14 % EX AERO
INHALATION_SPRAY | CUTANEOUS | Status: DC | PRN
  Administered 2017-09-16: 2 via TOPICAL

## 2017-09-16 MED ORDER — MIDAZOLAM HCL 10 MG/2ML IJ SOLN
INTRAMUSCULAR | Status: DC | PRN
  Administered 2017-09-16: 2 mg via INTRAVENOUS

## 2017-09-16 MED ORDER — SODIUM CHLORIDE BACTERIOSTATIC 0.9 % IJ SOLN
INTRAMUSCULAR | Status: DC | PRN
  Administered 2017-09-16: 9 mL

## 2017-09-16 MED ORDER — FENTANYL CITRATE (PF) 100 MCG/2ML IJ SOLN
INTRAMUSCULAR | Status: DC | PRN
  Administered 2017-09-16: 25 ug via INTRAVENOUS

## 2017-09-16 MED ORDER — MIDAZOLAM HCL 5 MG/ML IJ SOLN
INTRAMUSCULAR | Status: AC
  Filled 2017-09-16: qty 2

## 2017-09-16 MED ORDER — SODIUM CHLORIDE 0.9 % IV SOLN
INTRAVENOUS | Status: DC
  Administered 2017-09-16: 15:00:00 via INTRAVENOUS
  Administered 2017-09-17: 1 mL via INTRAVENOUS

## 2017-09-16 MED ORDER — SODIUM CHLORIDE 0.9 % IV SOLN
INTRAVENOUS | Status: DC
  Administered 2017-09-16: 10:00:00 via INTRAVENOUS

## 2017-09-16 NOTE — Progress Notes (Signed)
  Echocardiogram 2D Echocardiogram has been performed.  [name] [name] F 09/16/2017, 11:42 AM

## 2017-09-16 NOTE — Progress Notes (Addendum)
PROGRESS NOTE    [name] [name]   VUY:[id number]  DOB: [birth year]  DOA: 09/13/2017 PCP: [name], MD   Brief Narrative:  [name] [name] is an 84 year old female with atrial fibrillation on Eliquis, type 2 diabetes mellitus, chronic kidney disease stage IV, COPD, hypertension who lives at home and presented to the ED with shortness of breath and cough. Her chest x-ray showed possible consolidation of left mid and left lung base.  Noted to have a fever of 102.3, respiratory rate of 32, pulse ox of 89% on room air. Admitted for pneumonia  Subjective: Cough is mild and she is mostly coughing up clear mucus.  She did cough up a little green mucus last night.  No shortness of breath when she walks to the bathroom ROS: no complaints of nausea, vomiting, constipation diarrhea, cough, dyspnea or dysuria. No other complaints.   Assessment & Plan:   Principal Problem:   Sepsis  /  CAP (community acquired pneumonia) -Continue Rocephin and azithromycin- improving - sputum culture >> normal flora - blood cx - negative  Active Problems:  Question echodensity in right atrium noted on 2 D echo - PE was recommended and performed today which shows a Chiari network in the right atrium which is likely  the finding noted on the transthoracic echo  Moderate to severe tricuspid regurgitation, positive bubble study-suspect small PFO RV moderately dilated with mildly decreased systolic function -chronic issues- Noted on TEE today   History of Wegener's granulomatosis Status post biopsy in 2018 Chronically on 5 mg daily of prednisone with no recurrence -Wears 2 L  O2 at night - Follows with Dr. Gwenette Greet   AKI- Chronic kidney disease (CKD), stage IV (severe) -Although Lasix has been held, creatinine has risen to 2.11 today (1.90 in the past) - I have decided to hold Eliquis -I will give her a normal saline bolus of 500 cc and slow continuous fluids -Follow-up creatinine tomorrow  Atrial  fibrillation-  Chronic CHADS2-VASc=6.  -Continue by systolic- holding Eliquis today until renal function improves   DVT prophylaxis: SCDs Code Status: Full code Family Communication:  Disposition Plan: home tomorrow Consultants:   none Procedures:   TTE  Study Conclusions  - Left ventricle: The cavity size was normal. There was mild   concentric hypertrophy. Systolic function was normal. The   estimated ejection fraction was in the range of 55% to 60%. Wall   motion was normal; there were no regional wall motion   abnormalities. - Ventricular septum: The contour showed diastolic flattening and   systolic flattening. - Aortic valve: There was mild to moderate regurgitation. - Mitral valve: There was moderate regurgitation. - Left atrium: The atrium was severely dilated. - Right ventricle: The cavity size was moderately dilated. Wall   thickness was normal. Systolic function was mildly reduced. - Right atrium: The atrium was severely dilated. - Tricuspid valve: There was severe regurgitation. - Pulmonary arteries: Systolic pressure was moderately increased.   PA peak pressure: 60 mm Hg (S).  Impressions:  - There is highly mobile filamentous echodensity in the right   atrium, it is not clear if it is attached to the tricuspid valve   (vegetation vs Chiari network). A TEE is recommended for further   evaluation.  TEE Antimicrobials:  Anti-infectives (From admission, onward)   Start     Dose/Rate Route Frequency Ordered Stop   09/14/17 2200  cefTRIAXone (ROCEPHIN) 1 g in sodium chloride 0.9 % 100 mL IVPB  1 g 200 mL/hr over 30 Minutes Intravenous Every 24 hours 09/13/17 2325 09/21/17 2159   09/14/17 2200  azithromycin (ZITHROMAX) 500 mg in sodium chloride 0.9 % 250 mL IVPB     500 mg 250 mL/hr over 60 Minutes Intravenous Every 24 hours 09/13/17 2325 09/21/17 2159   09/13/17 2145  cefTRIAXone (ROCEPHIN) 1 g in sodium chloride 0.9 % 100 mL IVPB     1 g 200 mL/hr  over 30 Minutes Intravenous  Once 09/13/17 2134 09/13/17 2234   09/13/17 2145  azithromycin (ZITHROMAX) tablet 500 mg     500 mg Oral  Once 09/13/17 2134 09/13/17 2208       Objective: Vitals:   09/16/17 1110 09/16/17 1120 09/16/17 1130 09/16/17 1140  BP: (!) 116/56 (!) 127/49 136/65 (!) 143/51  Pulse: 78 75 85 76  Resp: (!) 21 (!) 29 (!) 21 (!) 23  Temp:      TempSrc:      SpO2: 95% 94% 93% 96%  Weight:      Height:        Intake/Output Summary (Last 24 hours) at 09/16/2017 1255 Last data filed at 09/16/2017 1000 Gross per 24 hour  Intake 2050 ml  Output 0 ml  Net 2050 ml   Filed Weights   09/13/17 1943  Weight: 67.6 kg (149 lb)    Examination: General exam: Appears comfortable  HEENT: PERRLA, oral mucosa moist, no sclera icterus or thrush Respiratory system: Clear to auscultation. Respiratory effort normal. Cardiovascular system: S1 & S2 heard, RRR.   Gastrointestinal system: Abdomen soft, non-tender, nondistended. Normal bowel sound. No organomegaly Central nervous system: Alert and oriented. No focal neurological deficits. Extremities: No cyanosis, clubbing or edema Skin: No rashes or ulcers Psychiatry:  Mood & affect appropriate.     Data Reviewed: I have personally reviewed following labs and imaging studies  CBC: Recent Labs  Lab 09/13/17 2020 09/13/17 2030 09/13/17 2339 09/15/17 0526 09/16/17 0602  WBC 8.0  --  6.4 7.4 5.1  NEUTROABS 5.6  --  5.0 5.2 3.0  HGB 9.0* 8.2* 8.4* 8.5* 8.4*  HCT 27.4* 24.0* 25.6* 26.8* 26.5*  MCV 95.5  --  95.5 94.4 96.7  PLT 165  --  155 125* 811   Basic Metabolic Panel: Recent Labs  Lab 09/13/17 2030 09/14/17 0508 09/15/17 0526 09/16/17 0602  NA 133* 133* 135 136  K 4.2 4.7 4.5 4.3  CL 100* 101 104 103  CO2  --  _0 GLUCOSE 131* 196* 123* 113*  BUN 29* 33* 33* 37*  CREATININE 1.90* 1.93* 2.05* 2.11*  CALCIUM  --  8.6* 8.4* 8.6*  MG  --   --  2.1 2.2   GFR: Estimated Creatinine Clearance: 18.9  mL/min (A) (by C-G formula based on SCr of 2.11 mg/dL (H)). Liver Function Tests: Recent Labs  Lab 09/14/17 0508 09/15/17 0526  AST 34 23  ALT 14 12*  ALKPHOS 58 49  BILITOT 1.2 0.7  PROT 7.8 6.9  ALBUMIN 3.7 3.3*   Recent Labs  Lab 09/15/17 0526  LIPASE 34   No results for input(s): AMMONIA in the last 168 hours. Coagulation Profile: Recent Labs  Lab 09/16/17 0602  INR 1.31   Cardiac Enzymes: No results for input(s): CKTOTAL, CKMB, CKMBINDEX, TROPONINI in the last 168 hours. BNP (last 3 results) No results for input(s): PROBNP in the last 8760 hours. HbA1C: No results for input(s): HGBA1C in the last 72 hours. CBG: Recent Labs  Lab  09/15/17 1136 09/15/17 1644 09/15/17 2155 09/16/17 0740 09/16/17 1222  GLUCAP 137* 190* 138* 105* 112*   Lipid Profile: No results for input(s): CHOL, HDL, LDLCALC, TRIG, CHOLHDL, LDLDIRECT in the last 72 hours. Thyroid Function Tests: No results for input(s): TSH, T4TOTAL, FREET4, T3FREE, THYROIDAB in the last 72 hours. Anemia Panel: No results for input(s): VITAMINB12, FOLATE, FERRITIN, TIBC, IRON, RETICCTPCT in the last 72 hours. Urine analysis:  Sepsis Labs: _0 (procalcitonin:4,lacticidven:4) ) Recent Results (from the past 240 hour(s))  Culture, sputum-assessment     Status: None   Collection Time: 09/13/17 11:26 PM  Result Value Ref Range Status   Specimen Description SPUTUM  Final   Special Requests NONE  Final   Sputum evaluation   Final    THIS SPECIMEN IS ACCEPTABLE FOR SPUTUM CULTURE Performed at [facility], [place] [address], [place], [place] [address]    Report Status 09/14/2017 FINAL  Final  Culture, respiratory (NON-Expectorated)     Status: None   Collection Time: 09/13/17 11:26 PM  Result Value Ref Range Status   Specimen Description   Final    SPUTUM Performed at [place] [address] [place], [place] [address]    Special Requests   Final    NONE  Reflexed from [id number] Performed at [facility], [place] [address], [place], [place] [address]    Gram Stain   Final    FEW WBC PRESENT, PREDOMINANTLY PMN RARE EPITHELIAL CELLS SEEN FEW GRAM POSITIVE COCCI IN PAIRS IN CHAINS RARE GRAM VARIABLE ROD    Culture   Final    FEW Consistent with normal respiratory flora. Performed at [facility], [place] [address], [place], [place] [address]    Report Status 09/16/2017 FINAL  Final  Culture, blood (routine x 2) Call MD if unable to obtain prior to antibiotics being given     Status: None (Preliminary result)   Collection Time: 09/13/17 11:39 PM  Result Value Ref Range Status   Specimen Description   Final    BLOOD RIGHT HAND Performed at [place] [address] [place], [place] [address]    Special Requests   Final    BOTTLES DRAWN AEROBIC ONLY Blood Culture adequate volume Performed at [place] [address] [place], [place] [address]    Culture   Final    NO GROWTH 2 DAYS Performed at [place] [address], [place], [place] [address]    Report Status PENDING  Incomplete  Culture, blood (routine x 2) Call MD if unable to obtain prior to antibiotics being given     Status: None (Preliminary result)   Collection Time: 09/13/17 11:40 PM  Result Value Ref Range Status   Specimen Description   Final    RIGHT ANTECUBITAL Performed at [place] [address] [place], [place] [address]    Special Requests   Final    BOTTLES DRAWN AEROBIC ONLY Blood Culture adequate volume Performed at [place] [address] [place], [place] [address]    Culture   Final    NO GROWTH 2 DAYS Performed at [place] [address] [place],  [address]    Report Status PENDING  Incomplete         Radiology Studies: Dg Chest 2 View  Result Date: 09/15/2017 CLINICAL DATA:  Shortness of breath in a  patient with a history of breast cancer  and atrial fibrillation. EXAM: CHEST - 2 VIEW COMPARISON:  PA and lateral chest 09/13/2017 and 02/02/2017. FINDINGS: Marked cardiomegaly is again seen. Airspace disease is seen in the left upper lobe which is new since the most recent study. No focal airspace disease on the right. The lungs appear emphysematous. Atherosclerosis noted. IMPRESSION: New left upper lobe airspace disease worrisome for pneumonia. Marked cardiomegaly without edema. Atherosclerosis. Electronically Signed   By: Inge Rise M.D.   On: 09/15/2017 09:17      Scheduled Meds: . amLODipine  5 mg Oral Daily  . feeding supplement (ENSURE ENLIVE)  237 mL Oral BID BM  . gabapentin  100 mg Oral TID  . insulin aspart  0-9 Units Subcutaneous TID WC  . levothyroxine  50 mcg Oral QAC breakfast  . nebivolol  2.5 mg Oral Daily  . predniSONE  5 mg Oral Q breakfast   Continuous Infusions: . azithromycin Stopped (09/15/17 2305)  . cefTRIAXone (ROCEPHIN)  IV Stopped (09/15/17 2203)  . sodium chloride       LOS: 3 days    Time spent in minutes: 35    Debbe Odea, MD [facility] Pager: [email or web address] Password [facility] 09/16/2017, 12:56 PM

## 2017-09-16 NOTE — CV Procedure (Signed)
Procedure: TEE  Sedation: Versed 2 mg IV, Fentanyl 25 mcg IV  Indication: RA mass  Findings: Please see echo section for full report.  Normal LV size with mild LV hypertrophy.  EF 55-60%, normal wall motion.  The RV was moderately dilated with mildly decreased systolic function.  There was moderate-severe TR.  Peak RV-RA gradient 79 mmHg.  Moderate left atrial enlargement.  There was smoke but no thrombus in the LA appendage.  Moderate right atrial enlargement.  There was a prominent Chiari network in the RA, this is likely the structure of concern on TTE.  Mild mitral regurgitation.  Trileaflet, moderately calcified aortic valve with trivial regurgitation.  No AS.  Positive bubble study, suspect small PFO.  Normal caliber aorta with grade 3 plaque in the descending thoracic aorta.  Small pericardial effusion.   Structure of concern in RA = Chiari network (benign).   [name] [name] 09/16/2017 11:19 AM

## 2017-09-16 NOTE — Progress Notes (Signed)
Pt is in a procedure having TEE at [facility] this AM and will try to see at another time.    09/16/17 1000  PT Visit Information  Last PT Received On 09/16/17  Reason Eval/Treat Not Completed Patient at procedure or test/unavailable    Mee Hives, PT MS Acute Rehab Dept. Number: [place] [facility] [place]

## 2017-09-16 NOTE — Care Management Important Message (Signed)
Important Message  Patient Details  Name: MAYCEL RIFFE MRN: [id number] Date of Birth: [birth year]   Medicare Important Message Given:  Yes    Kerin Salen 09/16/2017, 10:24 AMImportant Message  Patient Details  Name: VELDA WENDT MRN: [id number] Date of Birth: Jul 21, 1932   Medicare Important Message Given:  Yes    Kerin Salen 09/16/2017, 10:24 AM

## 2017-09-17 ENCOUNTER — Inpatient Hospital Stay (HOSPITAL_COMMUNITY): Payer: Medicare Other

## 2017-09-17 ENCOUNTER — Encounter (HOSPITAL_COMMUNITY): Payer: Self-pay | Admitting: Internal Medicine

## 2017-09-17 DIAGNOSIS — J189 Pneumonia, unspecified organism: Secondary | ICD-10-CM

## 2017-09-17 DIAGNOSIS — E1121 Type 2 diabetes mellitus with diabetic nephropathy: Secondary | ICD-10-CM

## 2017-09-17 LAB — BASIC METABOLIC PANEL
ANION GAP: 7 (ref 5–15)
BUN: 35 mg/dL — AB (ref 6–20)
CALCIUM: 8.4 mg/dL — AB (ref 8.9–10.3)
CO2: 23 mmol/L (ref 22–32)
Chloride: 108 mmol/L (ref 101–111)
Creatinine, Ser: 1.87 mg/dL — ABNORMAL HIGH (ref 0.44–1.00)
GFR calc Af Amer: 28 mL/min — ABNORMAL LOW (ref >60)
GFR calc non Af Amer: 24 mL/min — ABNORMAL LOW (ref >60)
GLUCOSE: 107 mg/dL — AB (ref 65–99)
Potassium: 4.8 mmol/L (ref 3.5–5.1)
Sodium: 138 mmol/L (ref 135–145)

## 2017-09-17 LAB — GLUCOSE, CAPILLARY
GLUCOSE-CAPILLARY: 126 mg/dL — AB (ref 65–99)
GLUCOSE-CAPILLARY: 166 mg/dL — AB (ref 65–99)
Glucose-Capillary: 101 mg/dL — ABNORMAL HIGH (ref 65–99)

## 2017-09-17 LAB — STREP PNEUMONIAE URINARY ANTIGEN: Strep Pneumo Urinary Antigen: NEGATIVE

## 2017-09-17 MED ORDER — AZITHROMYCIN 250 MG PO TABS: 500.0000 mg | ORAL_TABLET | Freq: Every day | ORAL | Status: DC

## 2017-09-17 MED ORDER — CEFUROXIME AXETIL 500 MG PO TABS: 500.0000 mg | ORAL_TABLET | Freq: Two times a day (BID) | ORAL | 0 refills | Status: AC

## 2017-09-17 MED ORDER — GUAIFENESIN ER 600 MG PO TB12: 600.0000 mg | ORAL_TABLET | Freq: Two times a day (BID) | ORAL | 0 refills | Status: AC | PRN

## 2017-09-17 MED ORDER — ENSURE ENLIVE PO LIQD: 237.0000 mL | Freq: Two times a day (BID) | ORAL | 12 refills | Status: AC

## 2017-09-17 MED ORDER — AZITHROMYCIN 250 MG PO TABS: 250.0000 mg | ORAL_TABLET | Freq: Every day | ORAL | 0 refills | Status: AC

## 2017-09-17 NOTE — Progress Notes (Signed)
Patient has discharged to home on 09/17/17. Discharge instruction including medication and appointment was given to patient. Patient has no question at this time.

## 2017-09-17 NOTE — Progress Notes (Signed)
PT Cancellation Note  Patient Details Name: [name] [name] MRN: [id number] DOB: 07-Jan-1933   Cancelled Treatment:    Reason Eval/Treat Not Completed: PT screened, no needs identified, will sign off(pt ambulated with RN this morning, O2 sats obtained. Pt reports she was steady walking with RW this morning and doesn't need PT. She has RW at home and plans to DC in the next hour. Pt signing off. )   Philomena Doheny 09/17/2017, 10:38 AM [phone]

## 2017-09-17 NOTE — Progress Notes (Signed)
Was called in to look at patient IV. It was noted to have infiltrated. IV was removed with catheter intact.  Pt denied pain but there was clear swelling of the right lower arm. IV team called to start new IV because of inability to use left arm for IV.

## 2017-09-17 NOTE — Progress Notes (Signed)
SATURATION QUALIFICATIONS: (This note is used to comply with regulatory documentation for home oxygen)  Patient Saturations on Room Air at Rest = 87%  Patient Saturations on Room Air while Ambulating = 78%  Patient Saturations on 2L Liters of oxygen while Ambulating = 88%  Patient Saturations on 3L Liters of oxygen while Ambulating = 90 - 92%  Please briefly explain why patient needs home oxygen:

## 2017-09-17 NOTE — Discharge Instructions (Signed)
Your doctor will need to repeat the CT chest in 3 months to ensure that the abnormalites have resolved.   You were cared for by a hospitalist during your hospital stay. If you have any questions about your discharge medications or the care you received while you were in the hospital after you are discharged, you can call the unit and asked to speak with the hospitalist on call if the hospitalist that took care of you is not available. Once you are discharged, your primary care physician will handle any further medical issues. Please note that NO REFILLS for any discharge medications will be authorized once you are discharged, as it is imperative that you return to your primary care physician (or establish a relationship with a primary care physician if you do not have one) for your aftercare needs so that they can reassess your need for medications and monitor your lab values.  Please take all your medications with you for your next visit with your Primary MD. Please ask your Primary MD to get all Hospital records sent to his/her office. Please request your Primary MD to go over all hospital test results at the follow up.    If you experience worsening of your admission symptoms, develop shortness of breath, chest pain, suicidal or homicidal thoughts or a life threatening emergency, you must seek medical attention immediately by calling 911 or calling your MD.   Dennis Bast must read the complete instructions/literature along with all the possible adverse reactions/side effects for all the medicines you take including new medications that have been prescribed to you. Take new medicines after you have completely understood and accpet all the possible adverse reactions/side effects.    Do not drive when taking pain medications or sedatives.     Do not take more than prescribed Pain, Sleep and Anxiety Medications   If you have smoked or chewed Tobacco in the last 2 yrs please stop. Stop any regular alcohol  and  or recreational drug use.   Wear Seat belts while driving.

## 2017-09-17 NOTE — Consult Note (Signed)
[facility] CM Inpatient Consult   09/17/2017  [name] [name] Aug 09, 1932 [id number]    Patient screened for potential [facility] Care Management services due to risk of unplanned readmission score of 27% (high).  Went to bedside to speak with [name] [name] and family about [place] [facility] Management program. [name] [name] pleasantly declines [facility] Care Management follow up. States she follows up with her doctors pretty regularly and knows when to call when concerning COPD. Denies having any concerns with transportation or with obtaining medications.   Confirmed Primary Care MD is Dr. Elease Hashimoto (Velora Heckler at [facility] practice is listed as doing transition of care call post discharge)  [name] [name] accepted [facility] Management brochure with contact information and 24-hr nurse advice line magnet.   [name], MSN-Ed, RN,BSN [facility] Liaison [phone]

## 2017-09-17 NOTE — Progress Notes (Signed)
Reassessed area of infiltration, the swelling has gone down and pt denies any pain or discomfort.

## 2017-09-17 NOTE — Discharge Summary (Signed)
Physician Discharge Summary  ENIJAH FURR YKD:[id number] DOB: 1933-03-22 DOA: 09/13/2017  PCP: [name], MD  Admit date: 09/13/2017 Discharge date: 09/17/2017  Admitted From: home Disposition:  home  Recommendations for Outpatient Follow-up:  1. D/c home with 3 L O2 - PCP to wean O2 as Pneumonia improves 2. Repeat CT chest in 3 months- see below please 3. Follow Cr closely while on Eliquis and Lasix  Home Health:  ordered  Equipment/Devices:  Portable O2  Discharge Condition:  stable   CODE STATUS:  Full  code   Consultations:  none    Discharge Diagnoses:  Principal Problem:   Community acquired bilateral lower lobe pneumonia-   Sepsis  Active Problems:   Essential hypertension   Type 2 diabetes mellitus ([place])   Wegener's granulomatosis (granulomatosis with polyangiitis) ([place])   Pulmonary hypertension ([place])   Chronic kidney disease (CKD), stage IV (severe) (HCC)   Chronic atrial fibrillation (HCC) CHADS2-VASc=6.      Chiari network- right atrium H/o metastatic intestinal carcinoid  Breast Cancer in 2006    Subjective: Able to walk down the hall with only mild dyspnea. Cough is improving. No other symptoms.   Brief Summary: [name] [name] is an 84 year old female with atrial fibrillation on Eliquis, type 2 diabetes mellitus, chronic kidney disease stage IV, COPD, hypertension who lives at home and presented to the ED with shortness of breath and cough. Her chest x-ray showed possible consolidation of left mid and left lung base.  Noted to have a fever of 102.3, respiratory rate of 32, pulse ox of 89% on room air. Admitted for pneumonia.  Hospital Course:  Principal Problem:   Sepsis  /  CAP (community acquired pneumonia) -she has been on Rocephin and azithromycin  - although her fevers have resolved, she is still hypoxic when ambulating - sputum culture >> normal flora - blood cx - negative - CT scan obtained today noted below shows b/l opacities and  lymphadenopathy- treating as pneumonia at this point but need to repeat CT in 3 months to ensure the opacities have resolved-   Active Problems:  Question echodensity in right atrium noted on 2 D echo - PE was recommended and performed today which shows a Chiari network in the right atrium which is likely  the finding noted on the transthoracic echo  Moderate to severe tricuspid regurgitation, positive bubble study-suspect small PFO RV moderately dilated with mildly decreased systolic function -chronic issues   History of Wegener's granulomatosis - Status post biopsy in 2018 - Chronically on 5 mg daily of prednisone with no recurrence -Wears 2 L  O2 at night at baseline - Follows with Dr. Gwenette Greet   AKI- Chronic kidney disease (CKD), stage IV (severe) - 6/12> Although Lasix has been held, creatinine has risen to 2.11 today (1.90 in the past) - I have decided to hold Eliquis -I will give her a normal saline bolus of 500 cc and slow continuous fluids - 6/13->> Cr 1.87 today  Atrial fibrillation-  Chronic CHADS2-VASc=6.  -Continue bystolic- held Eliquis yesterday until renal function improved- can resume  Procedures:   TTE  Study Conclusions  - Left ventricle: The cavity size was normal. There was mild concentric hypertrophy. Systolic function was normal. The estimated ejection fraction was in the range of 55% to 60%. Wall motion was normal; there were no regional wall motion abnormalities. - Ventricular septum: The contour showed diastolic flattening and systolic flattening. - Aortic valve: There was mild to moderate regurgitation. -  Mitral valve: There was moderate regurgitation. - Left atrium: The atrium was severely dilated. - Right ventricle: The cavity size was moderately dilated. Wall thickness was normal. Systolic function was mildly reduced. - Right atrium: The atrium was severely dilated. - Tricuspid valve: There was severe regurgitation. -  Pulmonary arteries: Systolic pressure was moderately increased. PA peak pressure: 60 mm Hg (S).  Impressions:  - There is highly mobile filamentous echodensity in the right atrium, it is not clear if it is attached to the tricuspid valve (vegetation vs Chiari network). A TEE is recommended for further evaluation.  TEE     Discharge Exam: Vitals:   09/16/17 1411 09/16/17 2141  BP: 133/68 137/76  Pulse: 63 66  Resp:  (!) 25  Temp: 98.4 F (36.9 C) 98.4 F (36.9 C)  SpO2: 91% 97%   Vitals:   09/16/17 1130 09/16/17 1140 09/16/17 1411 09/16/17 2141  BP: 136/65 (!) 143/51 133/68 137/76  Pulse: 85 76 63 66  Resp: (!) 21 (!) 23  (!) 25  Temp:   98.4 F (36.9 C) 98.4 F (36.9 C)  TempSrc:   Oral Oral  SpO2: 93% 96% 91% 97%  Weight:      Height:        General: Pt is alert, awake, not in acute distress Cardiovascular: IIRR, S1/S2 +, no rubs, no gallops Respiratory: crackles in b/l lower lung fields Abdominal: Soft, NT, ND, bowel sounds + Extremities: no edema, no cyanosis   Discharge Instructions  Discharge Instructions    Diet - low sodium heart healthy   Complete by:  As directed    Increase activity slowly   Complete by:  As directed      Allergies as of 09/17/2017      Reactions   Codeine Sulfate Other (See Comments)   REACTION: GI upset   Sulfonamide Derivatives Other (See Comments)   REACTION: GI upset   Vancomycin Other (See Comments)   Red man syndrome   Atarax [hydroxyzine] Nausea Only, Rash      Medication List    STOP taking these medications   amLODipine 5 MG tablet Commonly known as:  NORVASC     TAKE these medications   acetaminophen 500 MG tablet Commonly known as:  TYLENOL Take 500 mg by mouth every 6 (six) hours as needed for headache.   azithromycin 250 MG tablet Commonly known as:  ZITHROMAX Take 1 tablet (250 mg total) by mouth daily for 6 days.   BYSTOLIC 10 MG tablet Generic drug:  nebivolol TAKE 1 TABLET BY  MOUTH ONCE DAILY AFTER BREAKFAST   cefUROXime 500 MG tablet Commonly known as:  CEFTIN Take 1 tablet (500 mg total) by mouth 2 (two) times daily for 6 days.   diazepam 5 MG tablet Commonly known as:  VALIUM TAKE ONE TABLET BY MOUTH EVERY 12 HOURS AS NEEDED FOR SEVERE  VERTIGO  FLARES.  AVOID  REGULAR  USE. What changed:  See the new instructions.   ELIQUIS 2.5 MG Tabs tablet Generic drug:  apixaban TAKE 1 TABLET BY MOUTH TWICE DAILY   feeding supplement (ENSURE ENLIVE) Liqd Take 237 mLs by mouth 2 (two) times daily between meals. Start taking on:  09/18/2017   furosemide 20 MG tablet Commonly known as:  LASIX Take 1 tablet (20 mg total) by mouth daily.   gabapentin 100 MG capsule Commonly known as:  NEURONTIN TAKE 1 CAPSULE BY MOUTH TWICE DAILY BEFORE MEAL(S)   glimepiride 2 MG tablet Commonly known as:  AMARYL TAKE ONE TABLET BY MOUTH ONCE DAILY BEFORE BREAKFAST   guaiFENesin 600 MG 12 hr tablet Commonly known as:  MUCINEX Take 1 tablet (600 mg total) by mouth 2 (two) times daily as needed for cough.   levothyroxine 100 MCG tablet Commonly known as:  SYNTHROID, LEVOTHROID TAKE 1 TABLET BY MOUTH ONCE DAILY BEFORE BREAKFAST.   predniSONE 5 MG tablet Commonly known as:  [place] 1 TABLET BY MOUTH ONCE DAILY WITH  BREAKFAST            Durable Medical Equipment  (From admission, onward)        Start     Ordered   09/17/17 1452  For home use only DME oxygen  Once    Question Answer Comment  Mode or (Route) Nasal cannula   Liters per Minute 3   Frequency Continuous (stationary and portable oxygen unit needed)   Oxygen conserving device Yes   Oxygen delivery system Gas      09/17/17 1451     Follow-up Information    [name], MD. Schedule an appointment as soon as possible for a visit in 1 week(s).   Specialty:  Family Medicine Why:  needs to be seen by next Wednesday. Contact information: [place]  [address] [phone]          Allergies  Allergen Reactions  . Codeine Sulfate Other (See Comments)    REACTION: GI upset  . Sulfonamide Derivatives Other (See Comments)    REACTION: GI upset  . Vancomycin Other (See Comments)    Red man syndrome  . Atarax [Hydroxyzine] Nausea Only and Rash     Procedures/Studies:    Dg Chest 2 View  Result Date: 09/15/2017 CLINICAL DATA:  Shortness of breath in a patient with a history of breast cancer and atrial fibrillation. EXAM: CHEST - 2 VIEW COMPARISON:  PA and lateral chest 09/13/2017 and 02/02/2017. FINDINGS: Marked cardiomegaly is again seen. Airspace disease is seen in the left upper lobe which is new since the most recent study. No focal airspace disease on the right. The lungs appear emphysematous. Atherosclerosis noted. IMPRESSION: New left upper lobe airspace disease worrisome for pneumonia. Marked cardiomegaly without edema. Atherosclerosis. Electronically Signed   By: Inge Rise M.D.   On: 09/15/2017 09:17   Dg Chest 2 View  Result Date: 09/13/2017 CLINICAL DATA:  Cough and fever for 24 hours EXAM: CHEST - 2 VIEW COMPARISON:  February 02, 2017 FINDINGS: The heart size and mediastinal contours are stable. Heart size is enlarged. Possible patchy consolidation of the left mid lung and left lung base is noted. The right lung is clear. There is cephalization of flow. The visualized skeletal structures are stable. IMPRESSION: Cephalization of flow without frank pulmonary edema. Possible consolidation of the left mid lung and left lung base. Electronically Signed   By: Abelardo Diesel M.D.   On: 09/13/2017 21:09   Ct Chest Wo Contrast  Result Date: 09/17/2017 CLINICAL DATA:  Short of breath for 1 week. History of breast cancer diagnosed 2015 with radiation therapy complete. EXAM: CT CHEST WITHOUT CONTRAST TECHNIQUE: Multidetector CT imaging of the chest was performed following the standard protocol without IV contrast. COMPARISON:  CT  04/14/2013 FINDINGS: Cardiovascular: Heart is enlarged. Coronary artery calcification and aortic atherosclerotic calcification. Mediastinum/Nodes: No axillary supraclavicular adenopathy. Mildly enlarged paratracheal lymph nodes slightly progressed. RIGHT lower paratracheal node measures 13 mm short axis compared to 11 mm. No hilar adenopathy identified on noncontrast exam  Lungs/Pleura: There is increased peribronchial thickening in the lower lobes. Increase nodularity along the oblique fissures. Patchy ground-glass airspace opacities in lower lobes. Several nodular airspace opacities are present. For example LEFT lower lobe 14 mm nodule image 87/5. In the upper lobes there is fine nodular ground-glass opacities. These are similar to prior. Upper Abdomen: Limited view of the liver, kidneys, pancreas are unremarkable. Normal adrenal glands. Musculoskeletal: No aggressive osseous lesion. IMPRESSION: 1. Increased bibasilar peribronchial thickening, nodular patchy ground-glass opacities and intrafissural pleural thickening. Findings are suggestive of progressive inflammatory or infectious process. Lymphangitic spread of carcinoma could have this appearance but is not favored. Given the findings are new from comparison exam, recommend follow-up CT (consider high-resolution CT) in 3 months. 2. Increased mediastinal adenopathy is favored reactive. 3. Cardiomegaly. Coronary artery calcification and Aortic Atherosclerosis (ICD10-I70.0). Electronically Signed   By: Suzy Bouchard M.D.   On: 09/17/2017 14:34   Dg Chest Port 1 View  Result Date: 09/17/2017 CLINICAL DATA:  Hypoxemia EXAM: PORTABLE CHEST 1 VIEW COMPARISON:  Chest x-ray of 09/15/2017 and 09/13/2016 FINDINGS: The does appear to been some improvement in probable mild pulmonary vascular congestion. Cardiomegaly and cardiac configuration is unchanged. There is still some haziness in the left perihilar region and right upper lung field which may be due to residual  edema or possibly pneumonia and continued follow-up is recommended. No bony abnormality is seen. IMPRESSION: 1. Some improvement in pulmonary vascular congestion. 2. Haziness remains in the left perihilar region and right upper lobe which may represent residual edema or possibly pneumonia. Recommend follow-up. 3. Stable moderate cardiomegaly. Electronically Signed   By: Ivar Drape M.D.   On: 09/17/2017 10:19     The results of significant diagnostics from this hospitalization (including imaging, microbiology, ancillary and laboratory) are listed below for reference.     Microbiology: Recent Results (from the past 240 hour(s))  Culture, sputum-assessment     Status: None   Collection Time: 09/13/17 11:26 PM  Result Value Ref Range Status   Specimen Description SPUTUM  Final   Special Requests NONE  Final   Sputum evaluation   Final    THIS SPECIMEN IS ACCEPTABLE FOR SPUTUM CULTURE Performed at [facility], [place] [address] [place], [place] [address]    Report Status 09/14/2017 FINAL  Final  Culture, respiratory (NON-Expectorated)     Status: None   Collection Time: 09/13/17 11:26 PM  Result Value Ref Range Status   Specimen Description   Final    SPUTUM Performed at [place] [address], [place], [place] [address]    Special Requests   Final    NONE Reflexed from [id number] Performed at [facility], [place] [address], [place], [place] [address]    Gram Stain   Final    FEW WBC PRESENT, PREDOMINANTLY PMN RARE EPITHELIAL CELLS SEEN FEW GRAM POSITIVE COCCI IN PAIRS IN CHAINS RARE GRAM VARIABLE ROD    Culture   Final    FEW Consistent with normal respiratory flora. Performed at [facility], [place] [address] [place], [place] [address]    Report Status 09/16/2017 FINAL  Final  Culture, blood (routine x 2) Call MD if unable to obtain prior to antibiotics being given     Status: None (Preliminary result)   Collection  Time: 09/13/17 11:39 PM  Result Value Ref Range Status   Specimen Description   Final    BLOOD RIGHT HAND Performed at [facility],  [place] [address] [place], [place] [address]    Special Requests   Final    BOTTLES DRAWN AEROBIC ONLY Blood Culture adequate volume Performed at [place] [address], [place], [place] [address]    Culture   Final    NO GROWTH 3 DAYS Performed at [facility], [place] [address] [place], [place] [address]    Report Status PENDING  Incomplete  Culture, blood (routine x 2) Call MD if unable to obtain prior to antibiotics being given     Status: None (Preliminary result)   Collection Time: 09/13/17 11:40 PM  Result Value Ref Range Status   Specimen Description   Final    RIGHT ANTECUBITAL Performed at [place] [address] [place], [place] [address]    Special Requests   Final    BOTTLES DRAWN AEROBIC ONLY Blood Culture adequate volume Performed at [place] [address], [place], [place] [address]    Culture   Final    NO GROWTH 3 DAYS Performed at [facility], [place] [address] [place], [place] [address]    Report Status PENDING  Incomplete     Labs: BNP (last 3 results) Recent Labs    09/30/16 0546 09/13/17 2020  BNP 1,294.9* 5,483.2*   Basic Metabolic Panel: Recent Labs  Lab 09/13/17 2030 09/14/17 0508 09/15/17 0526 09/16/17 0602 09/17/17 0601  NA 133* 133* 135 136 138  K 4.2 4.7 4.5 4.3 4.8  CL 100* 101 104 103 108  CO2  --  _0 GLUCOSE 131* 196* 123* 113* 107*  BUN 29* 33* 33* 37* 35*  CREATININE 1.90* 1.93* 2.05* 2.11* 1.87*  CALCIUM  --  8.6* 8.4* 8.6* 8.4*  MG  --   --  2.1 2.2  --    Liver Function Tests: Recent Labs  Lab 09/14/17 0508 09/15/17 0526  AST 34 23  ALT 14 12*  ALKPHOS 58 49  BILITOT 1.2 0.7  PROT 7.8 6.9  ALBUMIN 3.7 3.3*   Recent Labs  Lab 09/15/17 0526  LIPASE 34   No results  for input(s): AMMONIA in the last 168 hours. CBC: Recent Labs  Lab 09/13/17 2020 09/13/17 2030 09/13/17 2339 09/15/17 0526 09/16/17 0602  WBC 8.0  --  6.4 7.4 5.1  NEUTROABS 5.6  --  5.0 5.2 3.0  HGB 9.0* 8.2* 8.4* 8.5* 8.4*  HCT 27.4* 24.0* 25.6* 26.8* 26.5*  MCV 95.5  --  95.5 94.4 96.7  PLT 165  --  155 125* 168   Cardiac Enzymes: No results for input(s): CKTOTAL, CKMB, CKMBINDEX, TROPONINI in the last 168 hours. BNP: Invalid input(s): POCBNP CBG: Recent Labs  Lab 09/16/17 1222 09/16/17 1658 09/16/17 2344 09/17/17 0737 09/17/17 1109  GLUCAP 112* 166* 126* 101* 166*   D-Dimer No results for input(s): DDIMER in the last 72 hours. Hgb A1c No results for input(s): HGBA1C in the last 72 hours. Lipid Profile No results for input(s): CHOL, HDL, LDLCALC, TRIG, CHOLHDL, LDLDIRECT in the last 72 hours. Thyroid function studies No results for input(s): TSH, T4TOTAL, T3FREE, THYROIDAB in the last 72 hours.  Invalid input(s): FREET3 Anemia work up No results for input(s): VITAMINB12, FOLATE, FERRITIN, TIBC, IRON, RETICCTPCT in the last 72 hours. Urinalysis    Component Value Date/Time   COLORURINE YELLOW 09/29/2016 0930   APPEARANCEUR CLEAR 09/29/2016 0930   LABSPEC 1.014 09/29/2016 0930   PHURINE 6.0 09/29/2016 0930  GLUCOSEU NEGATIVE 09/29/2016 0930   HGBUR MODERATE (A) 09/29/2016 0930   BILIRUBINUR NEGATIVE 09/29/2016 0930   BILIRUBINUR n 12/30/2010 1700   KETONESUR 5 (A) 09/29/2016 0930   PROTEINUR 100 (A) 09/29/2016 0930   UROBILINOGEN 1.0 09/25/2014 1550   NITRITE NEGATIVE 09/29/2016 0930   LEUKOCYTESUR LARGE (A) 09/29/2016 0930   Sepsis Labs Invalid input(s): PROCALCITONIN,  WBC,  LACTICIDVEN Microbiology Recent Results (from the past 240 hour(s))  Culture, sputum-assessment     Status: None   Collection Time: 09/13/17 11:26 PM  Result Value Ref Range Status   Specimen Description SPUTUM  Final   Special Requests NONE  Final   Sputum evaluation    Final    THIS SPECIMEN IS ACCEPTABLE FOR SPUTUM CULTURE Performed at [facility], [place] [address] [place], [place] [address]    Report Status 09/14/2017 FINAL  Final  Culture, respiratory (NON-Expectorated)     Status: None   Collection Time: 09/13/17 11:26 PM  Result Value Ref Range Status   Specimen Description   Final    SPUTUM Performed at [facility] [place] [address] [place], [place] [address]    Special Requests   Final    NONE Reflexed from [id number] Performed at [facility], [place] [address] [place], [place] [address]    Gram Stain   Final    FEW WBC PRESENT, PREDOMINANTLY PMN RARE EPITHELIAL CELLS SEEN FEW GRAM POSITIVE COCCI IN PAIRS IN CHAINS RARE GRAM VARIABLE ROD    Culture   Final    FEW Consistent with normal respiratory flora. Performed at [facility], [place] [address], [place], [place] [address]    Report Status 09/16/2017 FINAL  Final  Culture, blood (routine x 2) Call MD if unable to obtain prior to antibiotics being given     Status: None (Preliminary result)   Collection Time: 09/13/17 11:39 PM  Result Value Ref Range Status   Specimen Description   Final    BLOOD RIGHT HAND Performed at [place] [address], [place], [place] [address]    Special Requests   Final    BOTTLES DRAWN AEROBIC ONLY Blood Culture adequate volume Performed at [place] [address], [place], [place] [address]    Culture   Final    NO GROWTH 3 DAYS Performed at [facility], [place] [address] [place], [place] [address]    Report Status PENDING  Incomplete  Culture, blood (routine x 2) Call MD if unable to obtain prior to antibiotics being given     Status: None (Preliminary result)   Collection Time: 09/13/17 11:40 PM  Result Value Ref Range Status   Specimen Description   Final    RIGHT ANTECUBITAL Performed at [place]  [address], [place], [place] [address]    Special Requests   Final    BOTTLES DRAWN AEROBIC ONLY Blood Culture adequate volume Performed at [facility] [address], [place], [place] [address]    Culture   Final    NO GROWTH 3 DAYS Performed at [facility], [place] [address] [place], [place] [address]    Report Status PENDING  Incomplete     Time coordinating discharge in minutes: 65  SIGNED:   Debbe Odea, MD  [facility] 09/17/2017, 3:21 PM Pager   If 7PM-7AM, please contact night-coverage [email or web address] Password [email or web address]

## 2017-09-17 NOTE — Progress Notes (Signed)
PHARMACIST - PHYSICIAN COMMUNICATION DR:   Wynelle Cleveland CONCERNING: Antibiotic IV to Oral Route Change Policy  RECOMMENDATION: This patient is receiving azithromycin by the intravenous route.  Based on criteria approved by the Pharmacy and Therapeutics Committee, the antibiotic(s) is/are being converted to the equivalent oral dose form(s).   DESCRIPTION: These criteria include:  Patient being treated for a respiratory tract infection, urinary tract infection, cellulitis or clostridium difficile associated diarrhea if on metronidazole  The patient is not neutropenic and does not exhibit a GI malabsorption state  The patient is eating (either orally or via tube) and/or has been taking other orally administered medications for a least 24 hours  The patient is improving clinically and has a Tmax < 100.5  If you have questions about this conversion, please contact the Pharmacy Department  _0   [phone] )  [facility] _1   [phone] )  [facility] _2   [phone] )  [facility] _3   [phone] )  [facility] _4   [phone] )  [place], PharmD, BCPS 09/17/2017 7:28 AM

## 2017-09-18 ENCOUNTER — Encounter (HOSPITAL_COMMUNITY): Payer: Self-pay | Admitting: Cardiology

## 2017-09-18 ENCOUNTER — Telehealth: Payer: Self-pay | Admitting: Family Medicine

## 2017-09-18 MED ORDER — ONDANSETRON 4 MG PO TBDP: 4.0000 mg | ORAL_TABLET | Freq: Three times a day (TID) | ORAL | 0 refills | Status: AC | PRN

## 2017-09-18 NOTE — Telephone Encounter (Signed)
Rx sent.  Appointment already made.   Patient aware.

## 2017-09-18 NOTE — Telephone Encounter (Signed)
Zofran 4 mg ODT 1 every 8 hours as needed for nausea and vomiting #15 with no refill. Make sure patient gets hospital follow-up for early next week

## 2017-09-18 NOTE — Telephone Encounter (Signed)
Copied from [place] [id number]. Topic: General - Other >> Sep 18, 2017  8:58 AM Keene Breath wrote: Reason for CRM: Patient called to request something for nausea.  Stated that she is feeling very sick on the stomach.  She was just released from the hospital yesterday.  CB [phone].

## 2017-09-19 LAB — CULTURE, BLOOD (ROUTINE X 2)
CULTURE: NO GROWTH
Culture: NO GROWTH
SPECIAL REQUESTS: ADEQUATE
SPECIAL REQUESTS: ADEQUATE

## 2017-09-19 LAB — LEGIONELLA PNEUMOPHILA SEROGP 1 UR AG: L. PNEUMOPHILA SEROGP 1 UR AG: NEGATIVE

## 2017-09-21 ENCOUNTER — Inpatient Hospital Stay: Payer: Medicare Other | Admitting: Family Medicine

## 2017-09-23 ENCOUNTER — Inpatient Hospital Stay: Payer: Medicare Other | Admitting: Family Medicine

## 2017-09-23 ENCOUNTER — Ambulatory Visit (INDEPENDENT_AMBULATORY_CARE_PROVIDER_SITE_OTHER): Payer: Medicare Other | Admitting: Family Medicine

## 2017-09-23 ENCOUNTER — Encounter: Payer: Self-pay | Admitting: Family Medicine

## 2017-09-23 VITALS — BP 124/80 | HR 64 | Temp 98.3°F | Wt 144.2 lb

## 2017-09-23 DIAGNOSIS — J181 Lobar pneumonia, unspecified organism: Secondary | ICD-10-CM | POA: Diagnosis not present

## 2017-09-23 DIAGNOSIS — E1121 Type 2 diabetes mellitus with diabetic nephropathy: Secondary | ICD-10-CM

## 2017-09-23 DIAGNOSIS — J189 Pneumonia, unspecified organism: Secondary | ICD-10-CM | POA: Diagnosis not present

## 2017-09-23 DIAGNOSIS — I482 Chronic atrial fibrillation, unspecified: Secondary | ICD-10-CM

## 2017-09-23 DIAGNOSIS — N183 Chronic kidney disease, stage 3 unspecified: Secondary | ICD-10-CM

## 2017-09-23 DIAGNOSIS — M3130 Wegener's granulomatosis without renal involvement: Secondary | ICD-10-CM

## 2017-09-23 DIAGNOSIS — I10 Essential (primary) hypertension: Secondary | ICD-10-CM

## 2017-09-23 DIAGNOSIS — E038 Other specified hypothyroidism: Secondary | ICD-10-CM | POA: Diagnosis not present

## 2017-09-23 LAB — BASIC METABOLIC PANEL
BUN: 27 mg/dL — ABNORMAL HIGH (ref 6–23)
CHLORIDE: 98 meq/L (ref 96–112)
CO2: 32 mEq/L (ref 19–32)
Calcium: 8.9 mg/dL (ref 8.4–10.5)
Creatinine, Ser: 1.83 mg/dL — ABNORMAL HIGH (ref 0.40–1.20)
GFR: 28.02 mL/min — ABNORMAL LOW (ref >60.00)
Glucose, Bld: 110 mg/dL — ABNORMAL HIGH (ref 70–99)
POTASSIUM: 5.3 meq/L — AB (ref 3.5–5.1)
Sodium: 135 mEq/L (ref 135–145)

## 2017-09-23 LAB — CBC WITH DIFFERENTIAL/PLATELET
BASOS PCT: 1.3 % (ref 0.0–3.0)
Basophils Absolute: 0.1 10*3/uL (ref 0.0–0.1)
Eosinophils Absolute: 0.3 10*3/uL (ref 0.0–0.7)
Eosinophils Relative: 4.9 % (ref 0.0–5.0)
HEMATOCRIT: 27.3 % — AB (ref 36.0–46.0)
LYMPHS PCT: 23.7 % (ref 12.0–46.0)
Lymphs Abs: 1.5 10*3/uL (ref 0.7–4.0)
MCHC: 33.1 g/dL (ref 30.0–36.0)
MCV: 93.8 fl (ref 78.0–100.0)
MONOS PCT: 14.3 % — AB (ref 3.0–12.0)
Monocytes Absolute: 0.9 10*3/uL (ref 0.1–1.0)
Neutro Abs: 3.4 10*3/uL (ref 1.4–7.7)
Neutrophils Relative %: 55.8 % (ref 43.0–77.0)
Platelets: 298 10*3/uL (ref 150.0–400.0)
RBC: 2.91 Mil/uL — ABNORMAL LOW (ref 3.87–5.11)
RDW: 14.2 % (ref 11.5–15.5)
WBC: 6.1 10*3/uL (ref 4.0–10.5)

## 2017-09-23 LAB — TSH: TSH: 4.25 u[IU]/mL (ref 0.35–4.50)

## 2017-09-23 LAB — HEMOGLOBIN A1C: Hgb A1c MFr Bld: 6.8 % — ABNORMAL HIGH (ref 4.6–6.5)

## 2017-09-23 MED ORDER — METHYLPREDNISOLONE ACETATE 80 MG/ML IJ SUSP
80.0000 mg | Freq: Once | INTRAMUSCULAR | Status: AC
  Administered 2017-09-23: 80 mg via INTRAMUSCULAR

## 2017-09-23 NOTE — Patient Instructions (Signed)
Follow up for any fever or increased shortness of breath.

## 2017-09-23 NOTE — Progress Notes (Signed)
Subjective:     Patient ID: [name] [name], female   DOB: April 01, 1933, 84 y.o.   MRN: [id number]  HPI Patient seen for hospital follow-up for community-acquired bilateral lower lobe pneumonia. She was noted on 09/13/17 and discharged on the 13th. She has multiple chronic prompt include history of type 2 diabetes, Wegener's granulomatosis, portal hypertension, chronic kidney disease, chronic atrial fibrillation, history of metastatic intestinal carcinoid, and remote history of breast cancer.  She presented to the ED [facility] with shortness of breath and cough. She had fever 2.3 and respiratory rate 32 with pulse oxygen 89% room air.  Patient was treated broadly with Rocephin and Zithromax. Sputum cultures and blood cultures came back negative. CT scan showed bilateral opacities and lymphadenopathy. There was recognition consider three-month follow-up CT to ensure opacities resolving. Patient has question of echodensity in her right atrium on echo. Normal ejection fraction. She had ER network in the right atrium. Moderate to severe tricuspid regurg. Positive bubble study with suspected small PFO.  Patient has home oxygen which she wears at night at baseline. She's been using occasionally during the day with coughing episodes. She had acute kidney injury with creatinine up to 2.1 with baseline around 1.8. She is now back on eliquis 2.5 mg twice daily.  Generally feels weak overall. No nausea or vomiting. Coughing with some production of yellow to green sputum. No recurrent fever. Poor appetite. Her husband has dementia. She has a son is been very involved with her care.  Type 2 diabetes. History of good control. Not taking glimepiride regularly. Last A1c last October 5 0.7. She has hypothyroidism on replacement. Overdue for repeat TSH.  Past Medical History:  Diagnosis Date  . Arthritis    r tkr  . Breast cancer ([place])   . Chronic atrial fibrillation (HCC)    CHADS2-VASc=6.  on low dose Eliquis  (corrected for age & renal fxn)  . CVA (cerebral vascular accident) ([place]) 2008   mini stroke.no residual  . DEPRESSION 05/16/2009  . Diabetes mellitus type II   . Heart murmur    stress test 2009.dr peter [place]  . HYPERLIPIDEMIA 07/24/2008  . HYPERTENSION 07/24/2008  . HYPOTHYROIDISM 07/24/2008  . Intermittent vertigo   . Metastatic carcinoid tumor to intra-abdominal site ([place])   . Pneumonia   . Pulmonary hypertension ([place])    Mod PHTN on Echo 03/2016: 2/2 combination of Wegener's, HFPF & Afib.  . Skin abnormality    facial lesions .pt applying mupiracin to areas  . Vertigo   . Wegener's disease, pulmonary ([place]) 10/2012   Past Surgical History:  Procedure Laterality Date  . BREAST SURGERY  2007   Lumpectomy, XRT 2006.l breast  . CHOLECYSTECTOMY  1980  . EXPLORATORY LAPAROTOMY    . HEEL SPUR SURGERY Right   . JOINT REPLACEMENT     r knee  . KNEE SURGERY  2009   TKR  . TEE WITHOUT CARDIOVERSION N/A 09/16/2017   Procedure: TRANSESOPHAGEAL ECHOCARDIOGRAM (TEE);  Surgeon: Larey Dresser, MD;  Location[facility] ENDOSCOPY;  Service: Cardiovascular;  Laterality: N/A;  . TRANSTHORACIC ECHOCARDIOGRAM  03/2016   Afib (no Diastolic Fxn). EF 55-60%. No RWMA. Mild Aortic dilation Mild MR. Severe LA dilation. Mod RA dilation.  Mod TR with Mod-Severe Pulmonary HTN - but normal RV function..  . VENTRAL HERNIA REPAIR N/A 07/01/2016   Procedure: REPAIR OF INCARCERATED INCISIONAL HERNIA ;  Surgeon: Stark Klein, MD;  Location: [facility] ORS;  Service: General;  Laterality: N/A;  . VIDEO ASSISTED  THORACOSCOPY  04/22/2011   Procedure: VIDEO ASSISTED THORACOSCOPY;  Surgeon: Melrose Nakayama, MD;  Location: [place];  Service: Thoracic;  Laterality: Left;  WITH BIOPSY    reports that she quit smoking about 35 years ago. Her smoking use included cigarettes. She has a 6.00 pack-year smoking history. She has never used smokeless tobacco. She reports that she does not drink alcohol or use drugs. family history includes  Arthritis in her mother; Breast cancer in her sister; Cancer in her sister; Coronary artery disease in her brother and father; Heart disease in her father; Lung cancer in her brother; Rheum arthritis in her mother. Allergies  Allergen Reactions  . Codeine Sulfate Other (See Comments)    REACTION: GI upset  . Sulfonamide Derivatives Other (See Comments)    REACTION: GI upset  . Vancomycin Other (See Comments)    Red man syndrome  . Atarax [Hydroxyzine] Nausea Only and Rash     Review of Systems  Constitutional: Positive for fatigue. Negative for chills and fever.  HENT: Negative for congestion.   Respiratory: Positive for cough and shortness of breath.   Gastrointestinal: Negative for abdominal pain, nausea and vomiting.  Genitourinary: Negative for dysuria.  Neurological: Negative for dizziness.  Psychiatric/Behavioral: Negative for confusion.       Objective:   Physical Exam  Constitutional: She appears well-developed and well-nourished.  HENT:  Mouth/Throat: Oropharynx is clear and moist.  Neck: Neck supple.  Cardiovascular: Normal rate.  Pulmonary/Chest:  Pulse oximetry 92% room air. She has some diffuse wheezes. Normal respiratory rate. No retractions.  Musculoskeletal: She exhibits no edema.  Lymphadenopathy:    She has no cervical adenopathy.  Neurological: She is alert.       Assessment:     #1 recent community-acquired pneumonia likely bilateral lower lobes based on CT findings. Patient now finishing up outpatient antibiotics with no recurrent fever  #2 history of Wegener's granulomatosis on low-dose prednisone chronically  #3 type 2 diabetes with history of good control  #4 recent acute on chronic kidney disease. Baseline creatinine 1.8  #5 hypertension stable and at goal  #6 hypothyroidism  #7 chronic atrial fibrillation- on Eliquis    Plan:     -Check follow-up labs with basic metabolic panel, R1V, TSH, CBC -depo-medrol 80 mg IM given and monitor  blood sugars closely. -Follow-up promptly for any recurrent fever or increasing shortness of breath. Continue daytime use of oxygen as needed and continue with nighttime use. -We discussed possible medications for appetite stimulation such as Remeron but will wait to see if appetite improves off antibiotics. -bring back in one month to reassess. -offered home PT to help with strengthening and she declines. -over 40 minutes spent of which > 50 % in direct counseling regarding recent hospital events, medications,diet, possible nutritional supplements, home health service options.  [name] MD [facility] at [facility]

## 2017-09-25 ENCOUNTER — Telehealth: Payer: Self-pay | Admitting: Family Medicine

## 2017-09-25 NOTE — Telephone Encounter (Signed)
Copied from [place] [id number]. Topic: Quick Communication - See Telephone Encounter >> Sep 25, 2017 11:42 AM Robina Ade, Helene Kelp D wrote: CRM for notification. See Telephone encounter for: 09/25/17. Patient called and would like to know is she continues to take her amlodipine 5 mg. Please call patient back, thanks.

## 2017-09-25 NOTE — Telephone Encounter (Signed)
Patient is aware

## 2017-09-25 NOTE — Telephone Encounter (Signed)
No- continue to hold the Amlodipine- unless her BP starts to climb again (consistenlly > 007 systolic).

## 2017-09-26 ENCOUNTER — Emergency Department (HOSPITAL_COMMUNITY): Payer: Medicare Other

## 2017-09-26 ENCOUNTER — Encounter (HOSPITAL_COMMUNITY): Payer: Self-pay

## 2017-09-26 ENCOUNTER — Emergency Department (HOSPITAL_COMMUNITY)
Admission: EM | Admit: 2017-09-26 | Discharge: 2017-09-26 | Disposition: A | Discharge status: Home / Self Care | Payer: Medicare Other | Attending: Emergency Medicine | Admitting: Emergency Medicine

## 2017-09-26 ENCOUNTER — Other Ambulatory Visit: Payer: Self-pay

## 2017-09-26 DIAGNOSIS — I5032 Chronic diastolic (congestive) heart failure: Secondary | ICD-10-CM | POA: Diagnosis not present

## 2017-09-26 DIAGNOSIS — E1122 Type 2 diabetes mellitus with diabetic chronic kidney disease: Secondary | ICD-10-CM | POA: Diagnosis not present

## 2017-09-26 DIAGNOSIS — Z79899 Other long term (current) drug therapy: Secondary | ICD-10-CM | POA: Diagnosis not present

## 2017-09-26 DIAGNOSIS — E039 Hypothyroidism, unspecified: Secondary | ICD-10-CM | POA: Insufficient documentation

## 2017-09-26 DIAGNOSIS — N184 Chronic kidney disease, stage 4 (severe): Secondary | ICD-10-CM | POA: Diagnosis not present

## 2017-09-26 DIAGNOSIS — R000 Tachycardia, unspecified: Secondary | ICD-10-CM | POA: Diagnosis not present

## 2017-09-26 DIAGNOSIS — Z853 Personal history of malignant neoplasm of breast: Secondary | ICD-10-CM | POA: Insufficient documentation

## 2017-09-26 DIAGNOSIS — R0602 Shortness of breath: Secondary | ICD-10-CM | POA: Diagnosis not present

## 2017-09-26 DIAGNOSIS — Z87891 Personal history of nicotine dependence: Secondary | ICD-10-CM | POA: Diagnosis not present

## 2017-09-26 DIAGNOSIS — I482 Chronic atrial fibrillation, unspecified: Secondary | ICD-10-CM

## 2017-09-26 DIAGNOSIS — I130 Hypertensive heart and chronic kidney disease with heart failure and stage 1 through stage 4 chronic kidney disease, or unspecified chronic kidney disease: Secondary | ICD-10-CM | POA: Diagnosis not present

## 2017-09-26 DIAGNOSIS — R05 Cough: Secondary | ICD-10-CM | POA: Diagnosis not present

## 2017-09-26 DIAGNOSIS — J441 Chronic obstructive pulmonary disease with (acute) exacerbation: Secondary | ICD-10-CM | POA: Diagnosis not present

## 2017-09-26 DIAGNOSIS — I10 Essential (primary) hypertension: Secondary | ICD-10-CM | POA: Diagnosis not present

## 2017-09-26 LAB — BASIC METABOLIC PANEL
ANION GAP: 10 (ref 5–15)
BUN: 27 mg/dL — ABNORMAL HIGH (ref 6–20)
CO2: 29 mmol/L (ref 22–32)
Calcium: 9.2 mg/dL (ref 8.9–10.3)
Chloride: 98 mmol/L — ABNORMAL LOW (ref 101–111)
Creatinine, Ser: 1.74 mg/dL — ABNORMAL HIGH (ref 0.44–1.00)
GFR, EST AFRICAN AMERICAN: 30 mL/min — AB (ref >60)
GFR, EST NON AFRICAN AMERICAN: 26 mL/min — AB (ref >60)
Glucose, Bld: 132 mg/dL — ABNORMAL HIGH (ref 65–99)
POTASSIUM: 4.3 mmol/L (ref 3.5–5.1)
SODIUM: 137 mmol/L (ref 135–145)

## 2017-09-26 LAB — BRAIN NATRIURETIC PEPTIDE: B NATRIURETIC PEPTIDE 5: 1195.9 pg/mL — AB (ref 0.0–100.0)

## 2017-09-26 LAB — I-STAT TROPONIN, ED: TROPONIN I, POC: 0.01 ng/mL (ref 0.00–0.08)

## 2017-09-26 MED ORDER — PREDNISONE 10 MG (21) PO TBPK: ORAL_TABLET | ORAL | 0 refills | Status: DC

## 2017-09-26 MED ORDER — ALBUTEROL (5 MG/ML) CONTINUOUS INHALATION SOLN
10.0000 mg/h | INHALATION_SOLUTION | RESPIRATORY_TRACT | Status: DC
  Administered 2017-09-26: 10 mg/h via RESPIRATORY_TRACT
  Filled 2017-09-26: qty 20

## 2017-09-26 MED ORDER — METHYLPREDNISOLONE SODIUM SUCC 125 MG IJ SOLR
125.0000 mg | Freq: Once | INTRAMUSCULAR | Status: AC
  Administered 2017-09-26: 125 mg via INTRAVENOUS
  Filled 2017-09-26: qty 2

## 2017-09-26 MED ORDER — DIAZEPAM 5 MG PO TABS
5.0000 mg | ORAL_TABLET | Freq: Once | ORAL | Status: AC
  Administered 2017-09-26: 5 mg via ORAL
  Filled 2017-09-26: qty 1

## 2017-09-26 NOTE — ED Provider Notes (Signed)
[place] DEPT Provider Note   CSN: [id number] Arrival date & time: 09/26/17  1350     History   Chief Complaint Chief Complaint  Patient presents with  . Shortness of Breath    HPI [name] [name] is a 84 y.o. female.  Pt presents to the ED today with SOB.  The pt was admitted with PNA and sepsis from 6/9 to 6/13.  She was d/c with home O2, prednisone, and a z-pack.  She said she felt better when she was d/c, but started feeling bad again.  She lives with her son who smokes, but "not inside the house."  The pt denies f/c.  She is on Eliquis for a.fib.  CHA2DS2/VAS Stroke Risk Points  Current as of 4 minutes ago     6 >= 2 Points: High Risk  1 - 1.99 Points: Medium Risk  0 Points: Low Risk    This is the only CHA2DS2/VAS Stroke Risk Points available for the past  year.:  Last Change: N/A     Details    This score determines the patient's risk of having a stroke if the  patient has atrial fibrillation.       Points Metrics  1 Has Congestive Heart Failure:  Yes    Current as of 4 minutes ago  0 Has Vascular Disease:  No    Current as of 4 minutes ago  1 Has Hypertension:  Yes    Current as of 4 minutes ago  2 Age:  45    Current as of 4 minutes ago  1 Has Diabetes:  Yes    Current as of 4 minutes ago  0 Had Stroke:  No  Had TIA:  No  Had thromboembolism:  No    Current as of 4 minutes ago  1 Female:  Yes    Current as of 4 minutes ago     EMS gave pt 10 mg albuterol, 0.5 mg atrovent en route.         Past Medical History:  Diagnosis Date  . Arthritis    r tkr  . Breast cancer ([place])   . Chronic atrial fibrillation (HCC)    CHADS2-VASc=6.  on low dose Eliquis (corrected for age & renal fxn)  . CVA (cerebral vascular accident) ([place]) 2008   mini stroke.no residual  . DEPRESSION 05/16/2009  . Diabetes mellitus type II   . Heart murmur    stress test 2009.dr peter [place]  . HYPERLIPIDEMIA 07/24/2008  . HYPERTENSION 07/24/2008  .  HYPOTHYROIDISM 07/24/2008  . Intermittent vertigo   . Metastatic carcinoid tumor to intra-abdominal site ([place])   . Pneumonia   . Pulmonary hypertension ([place])    Mod PHTN on Echo 03/2016: 2/2 combination of Wegener's, HFPF & Afib.  . Skin abnormality    facial lesions .pt applying mupiracin to areas  . Vertigo   . Wegener's disease, pulmonary ([place]) 10/2012    Patient Active Problem List   Diagnosis Date Noted  . Community acquired bilateral lower lobe pneumonia ([place]) 09/17/2017  . [place] network- rigth atrium 09/16/2017  . Acute on chronic respiratory failure with hypoxia ([place]) 09/29/2016  . Malnutrition of moderate degree 07/01/2016  . Incarcerated hernia 06/30/2016  . Recurrent umbilical hernia with incarceration 06/30/2016  . Cellulitis and abscess   . Diabetic foot ulcer ([place]) 12/10/2015  . Hereditary and idiopathic peripheral neuropathy 09/25/2014  . Dependent edema 09/25/2014  . UTI (lower urinary tract infection)   .  Type 2 diabetes mellitus, controlled ([place]) 07/19/2014  . Chronic atrial fibrillation (HCC) CHADS2-VASc=6.  On Corrected "low" dose of Eliquis 04/04/2014  . CKD (chronic kidney disease) stage 3, GFR 30-59 ml/min (HCC) 04/04/2014  . Abdominal hernia without obstruction or gangrene 01/09/2014  . Nausea alone 08/21/2013  . Diastolic CHF, chronic (HCC) - on low dose Lasix. ARB & BB along with Norvasc 08/18/2013  . Sepsis ([place]) 08/18/2013  . Hand pain, left 08/18/2013  . Chronic kidney disease (CKD), stage IV (severe) ([place]) 05/08/2013  . Normocytic anemia 05/08/2013  . Pulmonary hypertension ([place]) 05/07/2013  . Right-sided heart failure ([place]) 05/07/2013  . Pancreatic cyst 04/17/2013  . Obesity (BMI 30-39.9) 12/28/2012  . Wegener's granulomatosis (granulomatosis with polyangiitis) ([place]) 11/02/2012  . Cough with hemoptysis 11/01/2012  . Anemia 11/01/2012  . Dehydration 11/01/2012  . Diffuse pulmonary alveolar hemorrhage 11/01/2012  . Breast cancer ([place]) 09/14/2012   . Pruritus of skin 05/20/2012  . Carcinoid tumor of colon, malignant ([place]) 05/16/2011  . Cryptogenic organizing pneumonia 04/05/2011  . Vertigo, intermittent 10/21/2010  . Type 2 diabetes mellitus ([place]) 06/04/2010  . Edema 03/25/2010  . Hyponatremia 02/13/2010  . DEPRESSION 05/16/2009  . Hypothyroidism 07/24/2008  . Hyperlipidemia 07/24/2008  . Essential hypertension 07/24/2008  . OSTEOARTHRITIS 07/24/2008    Past Surgical History:  Procedure Laterality Date  . BREAST SURGERY  2007   Lumpectomy, XRT 2006.l breast  . CHOLECYSTECTOMY  1980  . EXPLORATORY LAPAROTOMY    . HEEL SPUR SURGERY Right   . JOINT REPLACEMENT     r knee  . KNEE SURGERY  2009   TKR  . TEE WITHOUT CARDIOVERSION N/A 09/16/2017   Procedure: TRANSESOPHAGEAL ECHOCARDIOGRAM (TEE);  Surgeon: Larey Dresser, MD;  Location[facility] ENDOSCOPY;  Service: Cardiovascular;  Laterality: N/A;  . TRANSTHORACIC ECHOCARDIOGRAM  03/2016   Afib (no Diastolic Fxn). EF 55-60%. No RWMA. Mild Aortic dilation Mild MR. Severe LA dilation. Mod RA dilation.  Mod TR with Mod-Severe Pulmonary HTN - but normal RV function..  . VENTRAL HERNIA REPAIR N/A 07/01/2016   Procedure: REPAIR OF INCARCERATED INCISIONAL HERNIA ;  Surgeon: Stark Klein, MD;  Location: [facility] ORS;  Service: General;  Laterality: N/A;  . VIDEO ASSISTED THORACOSCOPY  04/22/2011   Procedure: VIDEO ASSISTED THORACOSCOPY;  Surgeon: Melrose Nakayama, MD;  Location: [place];  Service: Thoracic;  Laterality: Left;  WITH BIOPSY     OB History   None      Home Medications    Prior to Admission medications   Medication Sig Start Date End Date Taking? Authorizing Provider  acetaminophen (TYLENOL) 500 MG tablet Take 500 mg by mouth every 6 (six) hours as needed for headache.    [provider]  BYSTOLIC 10 MG tablet TAKE 1 TABLET BY MOUTH ONCE DAILY AFTER BREAKFAST 07/13/17   Burchette, Alinda Sierras, MD  diazepam (VALIUM) 5 MG tablet TAKE ONE TABLET BY MOUTH EVERY 12 HOURS AS  NEEDED FOR SEVERE  VERTIGO  FLARES.  AVOID  REGULAR  USE. Patient taking differently: TAKE 2.5 mg BY MOUTH EVERY 12 HOURS AS NEEDED FOR SEVERE  VERTIGO  FLARES.  AVOID  REGULAR  USE. 01/13/17   Burchette, Alinda Sierras, MD  ELIQUIS 2.5 MG TABS tablet TAKE 1 TABLET BY MOUTH TWICE DAILY 08/26/17   Burchette, Alinda Sierras, MD  feeding supplement, ENSURE ENLIVE, (ENSURE ENLIVE) LIQD Take 237 mLs by mouth 2 (two) times daily between meals. 09/18/17   Debbe Odea, MD  furosemide (LASIX) 20 MG tablet  Take 1 tablet (20 mg total) by mouth daily. 09/04/16   Burchette, Alinda Sierras, MD  gabapentin (NEURONTIN) 100 MG capsule TAKE 1 CAPSULE BY MOUTH TWICE DAILY BEFORE MEAL(S) 03/11/17   Burchette, Alinda Sierras, MD  glimepiride (AMARYL) 2 MG tablet TAKE ONE TABLET BY MOUTH ONCE DAILY BEFORE BREAKFAST 10/07/16   Burchette, Alinda Sierras, MD  guaiFENesin (MUCINEX) 600 MG 12 hr tablet Take 1 tablet (600 mg total) by mouth 2 (two) times daily as needed for cough. 09/17/17   Debbe Odea, MD  levothyroxine (SYNTHROID, LEVOTHROID) 100 MCG tablet TAKE 1 TABLET BY MOUTH ONCE DAILY BEFORE BREAKFAST. 07/27/17   Burchette, Alinda Sierras, MD  ondansetron (ZOFRAN-ODT) 4 MG disintegrating tablet Take 1 tablet (4 mg total) by mouth every 8 (eight) hours as needed for nausea or vomiting. 09/18/17   Burchette, Alinda Sierras, MD  predniSONE (STERAPRED UNI-PAK 21 TAB) 10 MG (21) TBPK tablet Take 6 tabs by mouth daily  for 2 days, then 5 tabs for 2 days, then 4 tabs for 2 days, then 3 tabs for 2 days, 2 tabs for 2 days, then 1 tab by mouth daily for 2 days 09/26/17   Isla Pence, MD    Family History Family History  Problem Relation Age of Onset  . Arthritis Mother   . Rheum arthritis Mother   . Heart disease Father   . Coronary artery disease Father   . Cancer Sister        breast CA, both sisters  . Breast cancer Sister   . Coronary artery disease Brother   . Lung cancer Brother     Social History Social History   Tobacco Use  . Smoking status: Former Smoker      Packs/day: 0.50    Years: 12.00    Pack years: 6.00    Types: Cigarettes    Last attempt to quit: 06/04/1982    Years since quitting: 35.3  . Smokeless tobacco: Never Used  Substance Use Topics  . Alcohol use: No  . Drug use: No     Allergies   Codeine sulfate; Sulfonamide derivatives; Vancomycin; and Atarax [hydroxyzine]   Review of Systems Review of Systems  Respiratory: Positive for cough, shortness of breath and wheezing.   All other systems reviewed and are negative.    Physical Exam Updated Vital Signs BP (!) 165/89 (BP Location: Right Arm)   Pulse 94   Temp 98 F (36.7 C) (Oral)   Resp (!) 22   SpO2 94%   Physical Exam  Constitutional: She is oriented to person, place, and time. She appears well-developed and well-nourished.  HENT:  Head: Normocephalic and atraumatic.  Mouth/Throat: Oropharynx is clear and moist.  Eyes: Pupils are equal, round, and reactive to light. EOM are normal.  Neck: Normal range of motion. Neck supple.  Cardiovascular: Normal rate. An irregularly irregular rhythm present.  Pulmonary/Chest: She has wheezes.  Abdominal: Soft. Bowel sounds are normal.  Musculoskeletal: Normal range of motion.       Right lower leg: Normal.       Left lower leg: Normal.  Neurological: She is alert and oriented to person, place, and time.  Skin: Skin is warm. Capillary refill takes less than 2 seconds.  Psychiatric: She has a normal mood and affect. Her behavior is normal.  Nursing note and vitals reviewed.    ED Treatments / Results  Labs (all labs ordered are listed, but only abnormal results are displayed) Labs Reviewed  BASIC METABOLIC PANEL - Abnormal;  Notable for the following components:      Result Value   Chloride 98 (*)    Glucose, Bld 132 (*)    BUN 27 (*)    Creatinine, Ser 1.74 (*)    GFR calc non Af Amer 26 (*)    GFR calc Af Amer 30 (*)    All other components within normal limits  BRAIN NATRIURETIC PEPTIDE - Abnormal;  Notable for the following components:   B Natriuretic Peptide 1,195.9 (*)    All other components within normal limits  I-STAT TROPONIN, ED    EKG EKG Interpretation  Date/Time:  Saturday September 26 2017 14:05:18 EDT Ventricular Rate:  78 PR Interval:    QRS Duration: 96 QT Interval:  372 QTC Calculation: 424 R Axis:   56 Text Interpretation:  Atrial fibrillation Borderline ST depression, diffuse leads Confirmed by Isla Pence [phone]) on 09/26/2017 2:14:51 PM Also confirmed by Isla Pence [phone]), editor Laurena Spies [phone])  on 09/26/2017 3:28:36 PM   Radiology Dg Chest Port 1 View  Result Date: 09/26/2017 CLINICAL DATA:  increased shortness of breath and productive cough. Recently discharged due to hospitilization with PNA. H/o A-fib, Heart murmur, HTN, and Type 2 Diabetes. Former smoker. EXAM: PORTABLE CHEST 1 VIEW COMPARISON:  Chest CT, 09/17/2017 and chest radiographs 09/17/2017 and older. FINDINGS: Stable cardiomegaly.  No mediastinal or hilar masses. Prominent interstitial markings noted bilaterally which are unchanged. Pulmonary anastomosis staple line in the mid left lung. No lung consolidation. No convincing pleural effusion or pneumothorax. Skeletal structures are demineralized but grossly intact. IMPRESSION: 1. No significant change from the previous chest radiograph. No convincing pulmonary edema or pneumonia. Stable cardiomegaly. Electronically Signed   By: Lajean Manes M.D.   On: 09/26/2017 14:46    Procedures Procedures (including critical care time)  Medications Ordered in ED Medications  albuterol (PROVENTIL,VENTOLIN) solution continuous neb (10 mg/hr Nebulization Restarted 09/26/17 1503)  diazepam (VALIUM) tablet 5 mg (has no administration in time range)  methylPREDNISolone sodium succinate (SOLU-MEDROL) 125 mg/2 mL injection 125 mg (125 mg Intravenous Given 09/26/17 1442)     Initial Impression / Assessment and Plan / ED Course  I have reviewed the  triage vital signs and the nursing notes.  Pertinent labs & imaging results that were available during my care of the patient were reviewed by me and considered in my medical decision making (see chart for details).     Pt is feeling better after nebs.  She does have oxygen at home that she can use as needed.  The pt wants to go home.  She knows to return if worse.  Final Clinical Impressions(s) / ED Diagnoses   Final diagnoses:  COPD exacerbation ([place])  Chronic atrial fibrillation [facility])    ED Discharge Orders        Ordered    predniSONE (STERAPRED UNI-PAK 21 TAB) 10 MG (21) TBPK tablet     09/26/17 1559       Isla Pence, MD 09/26/17 1600

## 2017-09-26 NOTE — ED Notes (Signed)
Bed: WA21 Expected date: 09/26/17 Expected time: 1:47 PM Means of arrival: Ambulance Comments: Hypertensive, [facility]

## 2017-09-26 NOTE — ED Triage Notes (Signed)
EMS reports from home called out by family for increased shortness of breath and productive cough.. Recently Dc for PNA. After 1 weeek admission.6/19. Taking Z-Pack  BP 182/80 HR 81 Resp 22 Sp02 90 RA  68m Albuterol and 0.563mAtrovent enroute

## 2017-09-28 ENCOUNTER — Telehealth: Payer: Self-pay | Admitting: Family Medicine

## 2017-09-28 NOTE — Telephone Encounter (Signed)
Copied from [place] [id number]. Topic: Quick Communication - Rx Refill/Question >> Sep 28, 2017 10:02 AM [place] Pila B wrote: Medication: predniSONE (STERAPRED UNI-PAK 21 TAB) 10 MG (21) TBPK tablet [[id number]] , amlodipine  Pt called b/c she was seen at the hospital and was given the medications above; but amlodipine is discontinued on her med list but she states she had to take it to get her bp down, she wants to know if she can take the two meds together, call pt to advise

## 2017-09-28 NOTE — Telephone Encounter (Signed)
Patient called and advised she can take Prednisone and Amlodipine together. Advised Amlodipine was put on hold by Dr. Elease Hashimoto and he noted to resume if SBP > 140 consistently. She says her BP shot up and she went to the ED and she's on oxygen now. I advised at her appointment tomorrow to discuss the Amlodipine with him because it is no longer listed on her medication profile, she verbalized understanding. She asks should she take Amlodipine tonight, I advised if the SBP is > 140, she verbalized understanding.

## 2017-09-29 ENCOUNTER — Ambulatory Visit (INDEPENDENT_AMBULATORY_CARE_PROVIDER_SITE_OTHER): Payer: Medicare Other | Admitting: Family Medicine

## 2017-09-29 ENCOUNTER — Encounter: Payer: Self-pay | Admitting: Family Medicine

## 2017-09-29 VITALS — BP 164/78 | HR 76 | Temp 98.2°F | Wt 138.7 lb

## 2017-09-29 DIAGNOSIS — I10 Essential (primary) hypertension: Secondary | ICD-10-CM | POA: Diagnosis not present

## 2017-09-29 DIAGNOSIS — I5032 Chronic diastolic (congestive) heart failure: Secondary | ICD-10-CM

## 2017-09-29 DIAGNOSIS — E1121 Type 2 diabetes mellitus with diabetic nephropathy: Secondary | ICD-10-CM

## 2017-09-29 DIAGNOSIS — J441 Chronic obstructive pulmonary disease with (acute) exacerbation: Secondary | ICD-10-CM | POA: Diagnosis not present

## 2017-09-29 NOTE — Patient Instructions (Signed)
Get back on daily Amlodipine 5 mg  Monitor blood pressure and be in touch if consistently > 140/90 after 2 weeks.

## 2017-09-29 NOTE — Progress Notes (Signed)
Subjective:     Patient ID: [name] [name], female   DOB: 01/20/1933, 84 y.o.   MRN: [id number]  HPI Follow-up from ER visit. Patient has complicated past history as previously outlined. Recent admission for pneumonia and sepsis. She presented to ER on 09/26/17 with increased shortness of breath. We had just seen her here and given Depo-Medrol for some wheezing. No fever. She has home oxygen but was not wearing this continuously.  Patient received nebulizer in the ER and was given another prednisone Dosepak. She feels slightly better today. No fever. BNP level > 1,000.  Marland Kitchen Recent echocardiogram normal systolic function. A1c 6.8%. Creatinine 1.74 which was compared with recent hospital creatinine over 2  Her blood pressure been low during recent hospitalization. BP over the past couple days over 720N systolic. She had been holding her amlodipine but instructed yesterday to start this back  Past Medical History:  Diagnosis Date  . Arthritis    r tkr  . Breast cancer ([place])   . Chronic atrial fibrillation (HCC)    CHADS2-VASc=6.  on low dose Eliquis (corrected for age & renal fxn)  . CVA (cerebral vascular accident) ([place]) 2008   mini stroke.no residual  . DEPRESSION 05/16/2009  . Diabetes mellitus type II   . Heart murmur    stress test 2009.dr peter Martin[place]  . HYPERLIPIDEMIA 07/24/2008  . HYPERTENSION 07/24/2008  . HYPOTHYROIDISM 07/24/2008  . Intermittent vertigo   . Metastatic carcinoid tumor to intra-abdominal site ([place])   . Pneumonia   . Pulmonary hypertension ([place])    Mod PHTN on Echo 03/2016: 2/2 combination of Wegener's, HFPF & Afib.  . Skin abnormality    facial lesions .pt applying mupiracin to areas  . Vertigo   . Wegener's disease, pulmonary ([place]) 10/2012   Past Surgical History:  Procedure Laterality Date  . BREAST SURGERY  2007   Lumpectomy, XRT 2006.l breast  . CHOLECYSTECTOMY  1980  . EXPLORATORY LAPAROTOMY    . HEEL SPUR SURGERY Right   . JOINT REPLACEMENT     r knee   . KNEE SURGERY  2009   TKR  . TEE WITHOUT CARDIOVERSION N/A 09/16/2017   Procedure: TRANSESOPHAGEAL ECHOCARDIOGRAM (TEE);  Surgeon: Larey Dresser, MD;  [facility] ENDOSCOPY;  Service: Cardiovascular;  Laterality: N/A;  . TRANSTHORACIC ECHOCARDIOGRAM  03/2016   Afib (no Diastolic Fxn). EF 55-60%. No RWMA. Mild Aortic dilation Mild MR. Severe LA dilation. Mod RA dilation.  Mod TR with Mod-Severe Pulmonary HTN - but normal RV function..  . VENTRAL HERNIA REPAIR N/A 07/01/2016   Procedure: REPAIR OF INCARCERATED INCISIONAL HERNIA ;  Surgeon: Stark Klein, MD;  Location: [facility] ORS;  Service: General;  Laterality: N/A;  . VIDEO ASSISTED THORACOSCOPY  04/22/2011   Procedure: VIDEO ASSISTED THORACOSCOPY;  Surgeon: Melrose Nakayama, MD;  Location: [place];  Service: Thoracic;  Laterality: Left;  WITH BIOPSY    reports that she quit smoking about 35 years ago. Her smoking use included cigarettes. She has a 6.00 pack-year smoking history. She has never used smokeless tobacco. She reports that she does not drink alcohol or use drugs. family history includes Arthritis in her mother; Breast cancer in her sister; Cancer in her sister; Coronary artery disease in her brother and father; Heart disease in her father; Lung cancer in her brother; Rheum arthritis in her mother. Allergies  Allergen Reactions  . Codeine Sulfate Other (See Comments)    REACTION: GI upset  . Sulfonamide Derivatives Other (See Comments)  REACTION: GI upset  . Vancomycin Other (See Comments)    Red man syndrome  . Atarax [Hydroxyzine] Nausea Only and Rash     Review of Systems  Constitutional: Negative for chills, fever and unexpected weight change.  Eyes: Negative for visual disturbance.  Respiratory: Negative for cough, chest tightness and wheezing.   Cardiovascular: Negative for chest pain, palpitations and leg swelling.  Genitourinary: Negative for dysuria.  Neurological: Negative for dizziness, seizures, syncope,  light-headedness and headaches.       Objective:   Physical Exam  Constitutional: She appears well-developed and well-nourished.  Eyes: Pupils are equal, round, and reactive to light.  Neck: Neck supple. No JVD present. No thyromegaly present.  Cardiovascular: Normal rate and regular rhythm. Exam reveals no gallop.  Pulmonary/Chest: Effort normal and breath sounds normal. No respiratory distress. She has no rales.  Wheezing from last visit seems to have cleared. No respiratory distress. O2 sat 95% room air  Musculoskeletal: She exhibits no edema.  Neurological: She is alert.       Assessment:     #1 recent community-acquired pneumonia clinically improved. She had significant wheezing and reactive airway component which is slowly improving on steroids  #2 type 2 diabetes with recent A1c 6.8% and exacerbated by recent prednisone  #3 hypertension poorly controlled    Plan:     -Start back amlodipine 5 mg daily. They have follow-up visit already rescheduled for July 19 -We'll look at getting portable oxygen concentrator -Continue 24/7 oxygen for now -Follow-up immediately for any fever or increasing shortness of breath  [name] MD [facility] at [facility]

## 2017-10-23 ENCOUNTER — Ambulatory Visit: Payer: Medicare Other | Admitting: Family Medicine

## 2017-10-23 DIAGNOSIS — Z0289 Encounter for other administrative examinations: Secondary | ICD-10-CM

## 2017-10-29 ENCOUNTER — Other Ambulatory Visit: Payer: Self-pay | Admitting: Family Medicine

## 2017-10-29 ENCOUNTER — Other Ambulatory Visit: Payer: Self-pay | Admitting: Internal Medicine

## 2017-10-29 NOTE — Telephone Encounter (Signed)
CY Please advise on refill-not on patients med list. Thanks.

## 2017-10-29 NOTE — Telephone Encounter (Signed)
Suggest we watch to see how she feels without prednsione.

## 2017-11-02 ENCOUNTER — Other Ambulatory Visit: Payer: Self-pay | Admitting: Family Medicine

## 2017-11-02 NOTE — Telephone Encounter (Signed)
Refill once.  She takes this only for severe recurrent vertigo symptoms.

## 2017-11-02 NOTE — Telephone Encounter (Signed)
Last refill 01/13/17 and last office visit 09/29/17.  Okay to fill?

## 2017-11-05 ENCOUNTER — Other Ambulatory Visit: Payer: Self-pay | Admitting: Internal Medicine

## 2017-11-06 ENCOUNTER — Other Ambulatory Visit: Payer: Self-pay

## 2017-11-10 ENCOUNTER — Telehealth: Payer: Self-pay | Admitting: Cardiology

## 2017-11-10 NOTE — Telephone Encounter (Signed)
Patient calling the office for samples of medication:   1.  What medication and dosage are you requesting samples for? Bystolic and Eliquis 2.  Are you currently out of this medication? yes

## 2017-11-10 NOTE — Telephone Encounter (Signed)
Medication samples have been provided to the patient.  Drug name: Eliquis 2.5  Qty: 28    LOT:  [id number]  Exp.Date: 09/20  Drug name: Bystolic 10 mg   Qty: 14    LOT: [id number]  Exp.Date: 12/19  Samples left at front desk for patient pick-up. Patient notified.

## 2017-12-08 ENCOUNTER — Encounter: Payer: Self-pay | Admitting: Family Medicine

## 2017-12-08 ENCOUNTER — Ambulatory Visit (INDEPENDENT_AMBULATORY_CARE_PROVIDER_SITE_OTHER): Payer: Medicare Other | Admitting: Family Medicine

## 2017-12-08 VITALS — BP 120/78 | HR 54 | Temp 98.5°F | Wt 153.2 lb

## 2017-12-08 DIAGNOSIS — R062 Wheezing: Secondary | ICD-10-CM

## 2017-12-08 DIAGNOSIS — R05 Cough: Secondary | ICD-10-CM

## 2017-12-08 DIAGNOSIS — R059 Cough, unspecified: Secondary | ICD-10-CM

## 2017-12-08 MED ORDER — METHYLPREDNISOLONE ACETATE 80 MG/ML IJ SUSP
80.0000 mg | Freq: Once | INTRAMUSCULAR | Status: AC
  Administered 2017-12-08: 80 mg via INTRAMUSCULAR

## 2017-12-08 MED ORDER — LEVOFLOXACIN 500 MG PO TABS: 500.0000 mg | ORAL_TABLET | Freq: Every day | ORAL | 0 refills | Status: DC

## 2017-12-08 MED ORDER — ALBUTEROL SULFATE HFA 108 (90 BASE) MCG/ACT IN AERS: 2.0000 | INHALATION_SPRAY | Freq: Four times a day (QID) | RESPIRATORY_TRACT | 0 refills | Status: DC | PRN

## 2017-12-08 NOTE — Patient Instructions (Signed)
Follow up for any increased shortness of breath or fever.

## 2017-12-08 NOTE — Progress Notes (Signed)
Subjective:     Patient ID: [name] [name], female   DOB: 07-04-1932, 84 y.o.   MRN: [id number]  HPI Patient seen with 3 day history of cough productive of green sputum and some intermittent wheezing. Cough is especially bothersome at night. No fever. She's had some nasal congestion. She is drinking fluids well and states her appetite is still good.  She has multiple chronic problems including history of diastolic heart failure, atrial fibrillation, Wegener's granulomatosis, hypertension, pulmonary hypertension. She has oxygen which she uses at night.  Past Medical History:  Diagnosis Date  . Arthritis    r tkr  . Breast cancer ([place])   . Chronic atrial fibrillation (HCC)    CHADS2-VASc=6.  on low dose Eliquis (corrected for age & renal fxn)  . CVA (cerebral vascular accident) ([place]) 2008   mini stroke.no residual  . DEPRESSION 05/16/2009  . Diabetes mellitus type II   . Heart murmur    stress test 2009.dr peter Martinique  . HYPERLIPIDEMIA 07/24/2008  . HYPERTENSION 07/24/2008  . HYPOTHYROIDISM 07/24/2008  . Intermittent vertigo   . Metastatic carcinoid tumor to intra-abdominal site ([place])   . Pneumonia   . Pulmonary hypertension ([place])    Mod PHTN on Echo 03/2016: 2/2 combination of Wegener's, HFPF & Afib.  . Skin abnormality    facial lesions .pt applying mupiracin to areas  . Vertigo   . Wegener's disease, pulmonary ([place]) 10/2012   Past Surgical History:  Procedure Laterality Date  . BREAST SURGERY  2007   Lumpectomy, XRT 2006.l breast  . CHOLECYSTECTOMY  1980  . EXPLORATORY LAPAROTOMY    . HEEL SPUR SURGERY Right   . JOINT REPLACEMENT     r knee  . KNEE SURGERY  2009   TKR  . TEE WITHOUT CARDIOVERSION N/A 09/16/2017   Procedure: TRANSESOPHAGEAL ECHOCARDIOGRAM (TEE);  Surgeon: Larey Dresser, MD;  Location[facility] ENDOSCOPY;  Service: Cardiovascular;  Laterality: N/A;  . TRANSTHORACIC ECHOCARDIOGRAM  03/2016   Afib (no Diastolic Fxn). EF 55-60%. No RWMA. Mild Aortic dilation Mild  MR. Severe LA dilation. Mod RA dilation.  Mod TR with Mod-Severe Pulmonary HTN - but normal RV function..  . VENTRAL HERNIA REPAIR N/A 07/01/2016   Procedure: REPAIR OF INCARCERATED INCISIONAL HERNIA ;  Surgeon: Stark Klein, MD;  Location: [facility];  Service: General;  Laterality: N/A;  . VIDEO ASSISTED THORACOSCOPY  04/22/2011   Procedure: VIDEO ASSISTED THORACOSCOPY;  Surgeon: Melrose Nakayama, MD;  Location: [place] [facility];  Service: Thoracic;  Laterality: Left;  WITH BIOPSY    reports that she quit smoking about 35 years ago. Her smoking use included cigarettes. She has a 6.00 pack-year smoking history. She has never used smokeless tobacco. She reports that she does not drink alcohol or use drugs. family history includes Arthritis in her mother; Breast cancer in her sister; Cancer in her sister; Coronary artery disease in her brother and father; Heart disease in her father; Lung cancer in her brother; Rheum arthritis in her mother. Allergies  Allergen Reactions  . Codeine Sulfate Other (See Comments)    REACTION: GI upset  . Sulfonamide Derivatives Other (See Comments)    REACTION: GI upset  . Vancomycin Other (See Comments)    Red man syndrome  . Atarax [Hydroxyzine] Nausea Only and Rash     Review of Systems  Constitutional: Positive for fatigue. Negative for chills and fever.  HENT: Positive for congestion.   Respiratory: Positive for cough and wheezing.   Cardiovascular: Negative for  chest pain and palpitations.       Objective:   Physical Exam  Constitutional: She appears well-developed and well-nourished.  Cardiovascular: Normal rate.  Pulmonary/Chest:  She has some faint expiratory wheezes. She has rales in both bases which apparently are chronic. Pulse oximetry 94%. Normal respiratory rate. No retractions.  Musculoskeletal:  Trace edema lower legs bilaterally       Assessment:     Cough. Patient at high risk for complication with history of pulmonary hypertension and  Wegener's granulomatosis    Plan:     -Depo-Medrol 80 mg IM given -Levaquin 500 milligrams once daily for 10 days -Follow-up promptly for any fever or increasing shortness of breath -Schedule routine follow-up within a month or so  [name] MD [facility] at [facility]

## 2017-12-09 ENCOUNTER — Telehealth: Payer: Self-pay | Admitting: Cardiology

## 2017-12-09 NOTE — Telephone Encounter (Signed)
Returned call to patient Eliquis 5 mg and Bystolic 10 mg left at [employer] office front desk.

## 2017-12-09 NOTE — Telephone Encounter (Signed)
Patient calling the office for samples of medication:   1.  What medication and dosage are you requesting samples for? Eliquis and Bystolic  2.  Are you currently out of this medication? no

## 2017-12-22 ENCOUNTER — Encounter: Payer: Self-pay | Admitting: Family Medicine

## 2017-12-22 ENCOUNTER — Other Ambulatory Visit: Payer: Self-pay

## 2017-12-22 ENCOUNTER — Ambulatory Visit (INDEPENDENT_AMBULATORY_CARE_PROVIDER_SITE_OTHER): Payer: Medicare Other | Admitting: Family Medicine

## 2017-12-22 VITALS — BP 138/80 | HR 67 | Temp 98.1°F | Resp 16 | Ht 65.0 in | Wt 149.8 lb

## 2017-12-22 DIAGNOSIS — I482 Chronic atrial fibrillation, unspecified: Secondary | ICD-10-CM

## 2017-12-22 DIAGNOSIS — Z23 Encounter for immunization: Secondary | ICD-10-CM | POA: Diagnosis not present

## 2017-12-22 DIAGNOSIS — N183 Chronic kidney disease, stage 3 unspecified: Secondary | ICD-10-CM

## 2017-12-22 DIAGNOSIS — I5032 Chronic diastolic (congestive) heart failure: Secondary | ICD-10-CM

## 2017-12-22 DIAGNOSIS — E038 Other specified hypothyroidism: Secondary | ICD-10-CM | POA: Diagnosis not present

## 2017-12-22 DIAGNOSIS — D649 Anemia, unspecified: Secondary | ICD-10-CM

## 2017-12-22 DIAGNOSIS — I10 Essential (primary) hypertension: Secondary | ICD-10-CM

## 2017-12-22 DIAGNOSIS — E1149 Type 2 diabetes mellitus with other diabetic neurological complication: Secondary | ICD-10-CM | POA: Diagnosis not present

## 2017-12-22 DIAGNOSIS — J441 Chronic obstructive pulmonary disease with (acute) exacerbation: Secondary | ICD-10-CM | POA: Diagnosis not present

## 2017-12-22 LAB — HEMOGLOBIN A1C: HEMOGLOBIN A1C: 6.3 % (ref 4.6–6.5)

## 2017-12-22 LAB — BASIC METABOLIC PANEL
BUN: 28 mg/dL — ABNORMAL HIGH (ref 6–23)
CO2: 24 mEq/L (ref 19–32)
CREATININE: 1.63 mg/dL — AB (ref 0.40–1.20)
Calcium: 8.9 mg/dL (ref 8.4–10.5)
Chloride: 99 mEq/L (ref 96–112)
GFR: 32 mL/min — ABNORMAL LOW (ref >60.00)
Glucose, Bld: 132 mg/dL — ABNORMAL HIGH (ref 70–99)
Potassium: 5.2 mEq/L — ABNORMAL HIGH (ref 3.5–5.1)
SODIUM: 131 meq/L — AB (ref 135–145)

## 2017-12-22 NOTE — Progress Notes (Signed)
Subjective:     Patient ID: [name] [name], female   DOB: [birth year], 84 y.o.   MRN: [id number]  HPI Patient has multiple chronic problems including history of hypertension, diastolic heart failure, chronic atrial fibrillation on Eliquis, Wegener's granulomatosis, pulmonary hypertension, type 2 diabetes with neuropathy, history of carcinoid tumor of the colon, Hypothyroidism, osteoarthritis, chronic kidney disease, and remote history of breast cancer. She was seen recently with cough and improved following antibiotics. She's back on her standard dose of prednisone which is 5 mgs daily. Feels much better. She feels that she is back to baseline from a respiratory standpoint  Family inquiring about smaller portable oxygen tank.  She has hypothyroidism. She had recent TSH which was normal. A1c 6.8% back in June. She takes low-dose Amaryl. No recent hypoglycemia. No polyuria or polydipsia.  Her husband has advanced Alzheimer's disease and she spends much of her day caring for him.  Past Medical History:  Diagnosis Date  . Arthritis    r tkr  . Breast cancer ([place])   . Chronic atrial fibrillation (HCC)    CHADS2-VASc=6.  on low dose Eliquis (corrected for age & renal fxn)  . CVA (cerebral vascular accident) ([place]) 2008   mini stroke.no residual  . DEPRESSION 05/16/2009  . Diabetes mellitus type II   . Heart murmur    stress test 2009.dr peter Martinique  . HYPERLIPIDEMIA 07/24/2008  . HYPERTENSION 07/24/2008  . HYPOTHYROIDISM 07/24/2008  . Intermittent vertigo   . Metastatic carcinoid tumor to intra-abdominal site ([place])   . Pneumonia   . Pulmonary hypertension ([place])    Mod PHTN on Echo 03/2016: 2/2 combination of Wegener's, HFPF & Afib.  . Skin abnormality    facial lesions .pt applying mupiracin to areas  . Vertigo   . Wegener's disease, pulmonary ([place]) 10/2012   Past Surgical History:  Procedure Laterality Date  . BREAST SURGERY  2007   Lumpectomy, XRT 2006.l breast  . CHOLECYSTECTOMY  1980   . EXPLORATORY LAPAROTOMY    . HEEL SPUR SURGERY Right   . JOINT REPLACEMENT     r knee  . KNEE SURGERY  2009   TKR  . TEE WITHOUT CARDIOVERSION N/A 09/16/2017   Procedure: TRANSESOPHAGEAL ECHOCARDIOGRAM (TEE);  Surgeon: Larey Dresser, MD;  Location: [facility] ENDOSCOPY;  Service: Cardiovascular;  Laterality: N/A;  . TRANSTHORACIC ECHOCARDIOGRAM  03/2016   Afib (no Diastolic Fxn). EF 55-60%. No RWMA. Mild Aortic dilation Mild MR. Severe LA dilation. Mod RA dilation.  Mod TR with Mod-Severe Pulmonary HTN - but normal RV function..  . VENTRAL HERNIA REPAIR N/A 07/01/2016   Procedure: REPAIR OF INCARCERATED INCISIONAL HERNIA ;  Surgeon: Stark Klein, MD;  Location: [facility];  Service: General;  Laterality: N/A;  . VIDEO ASSISTED THORACOSCOPY  04/22/2011   Procedure: VIDEO ASSISTED THORACOSCOPY;  Surgeon: Melrose Nakayama, MD;  Location: [place];  Service: Thoracic;  Laterality: Left;  WITH BIOPSY    reports that she quit smoking about 35 years ago. Her smoking use included cigarettes. She has a 6.00 pack-year smoking history. She has never used smokeless tobacco. She reports that she does not drink alcohol or use drugs. family history includes Arthritis in her mother; Breast cancer in her sister; Cancer in her sister; Coronary artery disease in her brother and father; Heart disease in her father; Lung cancer in her brother; Rheum arthritis in her mother. Allergies  Allergen Reactions  . Codeine Sulfate Other (See Comments)    REACTION: GI upset  .  Sulfonamide Derivatives Other (See Comments)    REACTION: GI upset  . Vancomycin Other (See Comments)    Red man syndrome  . Atarax [Hydroxyzine] Nausea Only and Rash     Review of Systems  Constitutional: Negative for appetite change and unexpected weight change.  Eyes: Negative for visual disturbance.  Respiratory: Negative for cough, chest tightness and wheezing.   Cardiovascular: Negative for chest pain, palpitations and leg swelling.   Gastrointestinal: Negative for abdominal pain.  Endocrine: Negative for polydipsia and polyuria.  Neurological: Negative for dizziness, seizures, syncope, weakness, light-headedness and headaches.       Objective:   Physical Exam  Constitutional: She appears well-developed and well-nourished.  Eyes: Pupils are equal, round, and reactive to light.  Neck: Neck supple. No JVD present. No thyromegaly present.  Cardiovascular: Normal rate. Exam reveals no gallop.  Irregular rhythm but rate controlled  Pulmonary/Chest: Effort normal. No respiratory distress.  Musculoskeletal: She exhibits no edema.  Neurological: She is alert.       Assessment:     #1 hypertension stable and at goal  #2 type 2 diabetes-history of good control  #3 chronic kidney disease stage III  #4 hypothyroidism  #5 Wegener's granulomatosis  #6 history of chronic atrial fibrillation rate controlled and on Eliquis.  #6 history of diastolic heart failure. No evidence for volume overload at this time.  #7 history of chronic normocytic anemia-her hemoglobin has been stable around the 9 range for quite some time.  Suspect related to her chronic kidney disease    Plan:     -flu vaccine given -We'll look at trying to get portable oxygen for her -Check labs with A1c and basic metabolic panel -Routine follow-up in 3 months and sooner as needed  [name] MD [facility] at [facility]

## 2017-12-28 ENCOUNTER — Other Ambulatory Visit: Payer: Self-pay | Admitting: Family Medicine

## 2017-12-28 NOTE — Telephone Encounter (Signed)
Refill for 1 year

## 2017-12-28 NOTE — Telephone Encounter (Signed)
Last OV 12/22/17, Next OV 03/23/18  Last filled by historical provider. Please advise if okay to fill

## 2018-02-08 ENCOUNTER — Other Ambulatory Visit: Payer: Self-pay | Admitting: Internal Medicine

## 2018-02-09 NOTE — Progress Notes (Signed)
[facility]. [address], [place]  [address] Phone: [phone] Fax:  [phone]  Date:  02/09/2018   ID:  [name] [name], DOB 04-Feb-1933, MRN [id number]  PCP:  [name], MD    History of Present Illness: [name] [name] is a 84 y.o. female with a hx of HTN, DM2, prior TIA, HL, breast CA, atrial fibrillation.  She is on Eliquis (CHADS2-VASc=6) for anticoagulation. She has a  dx of granulomatosis with polyangiitis (Wegener's).   Echo 04/14/13  EF 60-65%, trivial AI, mild-mod MR, mod to severe LAE, mild RVE, mod RAE, moderate TR,  PASP- 60 mm Hg.     In September 2017 she was admitted with diabetic foot ulcer with cellulitis.  In December 2017 she was admitted with CAP. She was also felt to have diastolic CHF with BNP elevated to 1332. Was started on lasix 20 mg daily. EF was normal by Echo. Moderate pulmonary HTN.  In March 2018 she was admitted with an incarcerated umbilical hernia. She underwent surgical repair without cardiac complications.   Readmitted in June 2018 with LLL PNA and respiratory failure- improved with antibiotics.   She was admitted in June 2019 with CAP and sepsis with hypoxemia. Treated with antibiotics. Echo showed ? RA mass but TEE showed this was a prominent Chiari network. Seen later in June in ED with COPD exacerbation that responded to steroids.   On follow up today she is doing well. She does have some intermittent swelling in her legs. She takes lasix as needed. some swelling in the last day or two. States she has been on her feet more taking care of her husband who has dementia.  Denies any dyspnea, palpitations, chest pain. No bleeding other than minor bruising.    Wt Readings from Last 3 Encounters:  12/22/17 149 lb 12.8 oz (67.9 kg)  12/08/17 153 lb 3.2 oz (69.5 kg)  09/29/17 138 lb 11.2 oz (62.9 kg)     Past Medical History:  Diagnosis Date  . Arthritis    r tkr  . Breast cancer ([facility])   . Chronic atrial fibrillation ([facility])     CHADS2-VASc=6.  on low dose Eliquis (corrected for age & renal fxn)  . CVA (cerebral vascular accident) ([facility]) 2008   mini stroke.no residual  . DEPRESSION 05/16/2009  . Diabetes mellitus type II   . Heart murmur    stress test 2009.dr peter [place]  . HYPERLIPIDEMIA 07/24/2008  . HYPERTENSION 07/24/2008  . HYPOTHYROIDISM 07/24/2008  . Intermittent vertigo   . Metastatic carcinoid tumor to intra-abdominal site ([facility])   . Pneumonia   . Pulmonary hypertension ([facility])    Mod PHTN on Echo 03/2016: 2/2 combination of Wegener's, HFPF & Afib.  . Skin abnormality    facial lesions .pt applying mupiracin to areas  . Vertigo   . Wegener's disease, pulmonary ([facility]) 10/2012    Current Outpatient Medications  Medication Sig Dispense Refill  . acetaminophen (TYLENOL) 500 MG tablet Take 500 mg by mouth every 6 (six) hours as needed for headache.    . albuterol (PROVENTIL HFA;VENTOLIN HFA) 108 (90 Base) MCG/ACT inhaler Inhale 2 puffs into the lungs every 6 (six) hours as needed for wheezing or shortness of breath. (Patient not taking: Reported on 12/22/2017) 1 Inhaler 0  . amLODipine (NORVASC) 5 MG tablet Take 5 mg by mouth daily.  2  . amLODipine (NORVASC) 5 MG tablet TAKE 1 TABLET BY MOUTH ONCE DAILY 90 tablet 2  . BYSTOLIC  10 MG tablet TAKE 1 TABLET BY MOUTH ONCE DAILY AFTER BREAKFAST 90 tablet 1  . diazepam (VALIUM) 5 MG tablet TAKE 1 TABLET BY MOUTH EVERY 12 HOURS AS NEEDED FOR SEVERE VERTIGO FLARES. AVOID REGULAR USE. 20 tablet 0  . ELIQUIS 2.5 MG TABS tablet TAKE 1 TABLET BY MOUTH TWICE DAILY 60 tablet 5  . feeding supplement, ENSURE ENLIVE, (ENSURE ENLIVE) LIQD Take 237 mLs by mouth 2 (two) times daily between meals. 237 mL 12  . furosemide (LASIX) 20 MG tablet Take 1 tablet (20 mg total) by mouth daily. 90 tablet 2  . gabapentin (NEURONTIN) 100 MG capsule TAKE 1 CAPSULE BY MOUTH TWICE DAILY BEFORE MEAL(S) 60 capsule 3  . glimepiride (AMARYL) 2 MG tablet TAKE ONE TABLET BY MOUTH ONCE DAILY BEFORE  BREAKFAST 90 tablet 1  . guaiFENesin (MUCINEX) 600 MG 12 hr tablet Take 1 tablet (600 mg total) by mouth 2 (two) times daily as needed for cough. (Patient not taking: Reported on 12/22/2017) 30 tablet 0  . levothyroxine (SYNTHROID, LEVOTHROID) 100 MCG tablet TAKE 1 TABLET BY MOUTH ONCE DAILY BEFORE BREAKFAST. 90 tablet 2  . ondansetron (ZOFRAN-ODT) 4 MG disintegrating tablet Take 1 tablet (4 mg total) by mouth every 8 (eight) hours as needed for nausea or vomiting. 15 tablet 0  . predniSONE (DELTASONE) 5 MG tablet TAKE 1 TABLET BY MOUTH ONCE DAILY WITH  BREAKFAST 30 tablet 0   No current facility-administered medications for this visit.     Allergies:    Allergies  Allergen Reactions  . Codeine Sulfate Other (See Comments)    REACTION: GI upset  . Sulfonamide Derivatives Other (See Comments)    REACTION: GI upset  . Vancomycin Other (See Comments)    Red man syndrome  . Atarax [Hydroxyzine] Nausea Only and Rash    Social History:  The patient  reports that she quit smoking about 35 years ago. Her smoking use included cigarettes. She has a 6.00 pack-year smoking history. She has never used smokeless tobacco. She reports that she does not drink alcohol or use drugs.   ROS:  Please see the history of present illness.   No hemoptysis.   All other systems reviewed and negative.   PHYSICAL EXAM: VS:  There were no vitals taken for this visit. GENERAL:  Well appearing WF in a wheelchair HEENT:  PERRL, EOMI, sclera are clear. Oropharynx is clear. NECK:  No jugular venous distention, carotid upstroke brisk and symmetric, no bruits, no thyromegaly or adenopathy LUNGS:  Few fine crackles.  CHEST:  Unremarkable HEART:  IRRR,  PMI not displaced or sustained,S1 and S2 within normal limits, no S3, no S4: no clicks, no rubs, gr 2/6 blowing systolic murmur across the precordium.  ABD:  Soft, nontender. BS +, no masses or bruits. No hepatomegaly, no splenomegaly EXT:  2 + pulses throughout, no edema,  no cyanosis no clubbing SKIN:  Warm and dry.  No rashes NEURO:  Alert and oriented x 3. Cranial nerves II through XII intact. PSYCH:  Cognitively intact        Laboratory data:  Lab Results  Component Value Date   WBC 6.1 09/23/2017   HGB 9.0 Repeated and verified X2. (L) 09/23/2017   HCT 27.3 (L) 09/23/2017   PLT 298.0 09/23/2017   GLUCOSE 132 (H) 12/22/2017   CHOL 132 11/30/2015   TRIG 100.0 11/30/2015   HDL 60.00 11/30/2015   LDLDIRECT 75.3 10/21/2011   LDLCALC 52 11/30/2015   ALT 12 (L) 09/15/2017  AST 23 09/15/2017   NA 131 (L) 12/22/2017   K 5.2 (H) 12/22/2017   CL 99 12/22/2017   CREATININE 1.63 (H) 12/22/2017   BUN 28 (H) 12/22/2017   CO2 24 12/22/2017   TSH 4.25 09/23/2017   INR 1.31 09/16/2017   HGBA1C 6.3 12/22/2017   MICROALBUR 1.7 08/15/2009    Echo: 04/04/16: Study Conclusions  - Left ventricle: The cavity size was normal. Wall thickness was   increased in a pattern of mild LVH. Systolic function was normal.   The estimated ejection fraction was in the range of 55% to 60%.   Indeterminant diastolic function (atrial fibrillation). Wall   motion was normal; there were no regional wall motion   abnormalities. - Aortic valve: There was no stenosis. There was trivial   regurgitation. - Aorta: Mildly dilated ascending aorta at 3.7 cm. - Mitral valve: Mildly calcified annulus. There was mild   regurgitation. - Left atrium: The atrium was severely dilated. - Right ventricle: The cavity size was normal. Systolic function   was normal. - Right atrium: Prominent Chiari network. The atrium was mildly to   moderately dilated. - Tricuspid valve: There was moderate regurgitation. Peak RV-RA   gradient (S): 54 mm Hg. - Pulmonary arteries: PA peak pressure: 62 mm Hg (S). - Systemic veins: IVC measured 2.3 cm with > 50% respirophasic   variation, suggesting RA pressure 8 mmHg. - Pericardium, extracardiac: A trivial pericardial effusion was    identified.  Impressions:  - The patient was in atrial fibrillation. Normal LV size with mild   LV hypertrophy. EF 55-60%. Normal RV size and systolic function.   Biatrial enlargement. Moderate tricuspid regurgitation, mild   mitral regurgitation. Moderate pulmonary hypertension.  Echo 09/14/17: Study Conclusions  - Left ventricle: The cavity size was normal. There was mild   concentric hypertrophy. Systolic function was normal. The   estimated ejection fraction was in the range of 55% to 60%. Wall   motion was normal; there were no regional wall motion   abnormalities. - Ventricular septum: The contour showed diastolic flattening and   systolic flattening. - Aortic valve: There was mild to moderate regurgitation. - Mitral valve: There was moderate regurgitation. - Left atrium: The atrium was severely dilated. - Right ventricle: The cavity size was moderately dilated. Wall   thickness was normal. Systolic function was mildly reduced. - Right atrium: The atrium was severely dilated. - Tricuspid valve: There was severe regurgitation. - Pulmonary arteries: Systolic pressure was moderately increased.   PA peak pressure: 60 mm Hg (S).  Impressions:  - There is highly mobile filamentous echodensity in the right   atrium, it is not clear if it is attached to the tricuspid valve   (vegetation vs Chiari network). A TEE is recommended for further   evaluation.  TEE 09/16/17: Study Conclusions  - Left ventricle: The cavity size was normal. Wall thickness was   increased in a pattern of mild LVH. Systolic function was normal.   The estimated ejection fraction was in the range of 55% to 60%.   Wall motion was normal; there were no regional wall motion   abnormalities. - Aortic valve: Trileaflet; moderately calcified leaflets. There   was no stenosis. There was trivial regurgitation. - Aorta: Normal caliber aorta with grade 3 plaque in the descending   thoracic aorta. - Mitral  valve: There was mild regurgitation. - Left atrium: Moderate left atrial enlargement. There was smoke   but no thrombus in the LA appendage. -  Right ventricle: The cavity size was moderately dilated. Systolic   function was mildly reduced. - Right atrium: Moderate right atrial enlargement. There was a   prominent Chiari network in the RA, this is likely the structure   of concern on TTE. - Atrial septum: Positive bubble study, suspect small PFO. - Tricuspid valve: There was moderate-severe TR. Peak RV-RA   gradient 79 mmHg. - Pericardium, extracardiac: Small pericardial effusion.  Impressions:  - Structure of concern in RA = Chiari network (benign).   ASSESSMENT AND PLAN:  1. Atrial Fibrillation:  Rate is well controlled on bystolic.    She is on the correct dose of Eliquis for her (age > 44 and weight > 60 kg).  CHADS2-VASc=6.  She is asymptomatic. Continue current therapy.  2. Pulmonary HTN: severe.  Likely related to sequelae of Wegener's granulomatosis +/- CHF. Doing pretty well currently. TR murmur on exam. Mild edema for which she will take lasix for 2 days. 3. Hypertension:  BP well controlled.   4. Granulomatosis with Polyangiitis (Wegener's):  Continue follow up with pulmonary and oncology as planned. On chronic steroids. 5. Chronic diastolic dysfunction.  Continue lasix as needed.  I will follow up in 6 months.

## 2018-02-11 ENCOUNTER — Encounter: Payer: Self-pay | Admitting: Cardiology

## 2018-02-11 ENCOUNTER — Ambulatory Visit (INDEPENDENT_AMBULATORY_CARE_PROVIDER_SITE_OTHER): Payer: Medicare Other | Admitting: Cardiology

## 2018-02-11 VITALS — BP 132/66 | HR 62 | Ht 66.0 in | Wt 155.2 lb

## 2018-02-11 DIAGNOSIS — I10 Essential (primary) hypertension: Secondary | ICD-10-CM

## 2018-02-11 DIAGNOSIS — I482 Chronic atrial fibrillation, unspecified: Secondary | ICD-10-CM

## 2018-02-11 DIAGNOSIS — I272 Pulmonary hypertension, unspecified: Secondary | ICD-10-CM

## 2018-02-11 DIAGNOSIS — I5032 Chronic diastolic (congestive) heart failure: Secondary | ICD-10-CM | POA: Diagnosis not present

## 2018-02-11 NOTE — Patient Instructions (Signed)
Continue your current therapy  I will see you in 6 months.   

## 2018-02-22 ENCOUNTER — Other Ambulatory Visit: Payer: Self-pay | Admitting: Family Medicine

## 2018-03-11 ENCOUNTER — Ambulatory Visit (INDEPENDENT_AMBULATORY_CARE_PROVIDER_SITE_OTHER): Payer: Medicare Other | Admitting: Internal Medicine

## 2018-03-11 ENCOUNTER — Encounter: Payer: Self-pay | Admitting: Internal Medicine

## 2018-03-11 DIAGNOSIS — M3130 Wegener's granulomatosis without renal involvement: Secondary | ICD-10-CM

## 2018-03-11 DIAGNOSIS — I482 Chronic atrial fibrillation, unspecified: Secondary | ICD-10-CM | POA: Diagnosis not present

## 2018-03-11 DIAGNOSIS — J849 Interstitial pulmonary disease, unspecified: Secondary | ICD-10-CM

## 2018-03-11 MED ORDER — PREDNISONE 5 MG PO TABS: ORAL_TABLET | ORAL | 5 refills | Status: DC

## 2018-03-11 NOTE — Assessment & Plan Note (Signed)
Atrial fibrillation with controlled response rate.  Followed by cardiology.

## 2018-03-11 NOTE — Progress Notes (Signed)
HPI F former smoker followed for bilateral pulmonary infiltrates/nodules, increased see-Anka, status post VATS biopsy/granulomatous polyangiitis (Wegener's) versus cryptogenic organizing pneumonia, complicated by metastatic intestinal carcinoid, history breast CVA 2006, A. fib/PE, HTN  ------------------------------------------------------------------------------------------- 09/03/2017- 84 year old female former smoker followed for bilateral pulmonary infiltrates/nodules, increased C-ANCA, status post VATS biopsy/granulomatous polyangiitis (Wegener's) versus cryptogenic organizing pneumonia/, metastatic intestinal carcinoid, nocturnal hypoxemia, complicated by history breast CA 2006, A. fib/PE HTN/,, DM O2 2 L Advanced- sleep Continues prednisone 5 mg daily maintenance Much family stress- husband Alzheimers, sister pancreatic Ca. Feels her health is stable and breathing ok. Diuretic controls edema. CXR 02/03/2017- IMPRESSION: CHF with mild pulmonary edema.  03/11/2018- 84 year old female former smoker followed for bilateral pulmonary infiltrates/nodules, increased C-ANCA, status post VATS biopsy/granulomatous polyangiitis (Wegener's) versus cryptogenic organizing pneumonia/, metastatic intestinal carcinoid, nocturnal hypoxemia, complicated by history breast CA 2006, A. fib/PE HTN/, DM O2 2 L Advanced- sleep Hosp June, 2019 for pneumonia -----Wegeners granulomatosis: Pt has been doing well with breathing but continues to have vertigo issues. Here with her son today.  No acute problems.  Sinus drainage sometimes bothers without headache or fever.  Occasional cough blamed on sinus drainage but nonproductive.  Stable dyspnea on exertion without fever or sweat.  She paces herself with activities at home.  She feels back to baseline after pneumonia in June.  We discussed CT from that time and agreed to repeat for follow-up. CT chest 09/17/2017- IMPRESSION: 1. Increased bibasilar peribronchial thickening,  nodular patchy ground-glass opacities and intrafissural pleural thickening. Findings are suggestive of progressive inflammatory or infectious process. Lymphangitic spread of carcinoma could have this appearance but is not favored. Given the findings are new from comparison exam, recommend follow-up CT (consider high-resolution CT) in 3 months. 2. Increased mediastinal adenopathy is favored reactive. 3. Cardiomegaly. Coronary artery calcification and Aortic Atherosclerosis (ICD10-I70.0).  ROS-see HPI    + = positive Constitutional:   +weight loss, night sweats, fevers, chills, +fatigue, lassitude. HEENT:   No-  headaches, difficulty swallowing, tooth/dental problems, sore throat,       No-  sneezing, itching, ear ache, nasal congestion, post nasal drip,  CV: No -chest pain, orthopnea, PND, swelling in lower extremities, anasarca,    No-palpitations Resp:   +shortness of breath with exertion or at rest.           No- productive cough,  +non-productive cough,               No-   change in color of mucus.  No- wheezing.   Skin: No-   rash or lesions. no- pruritus GI:  No-   heartburn, indigestion, abdominal pain, nausea, vomiting,  GU: MS:  No-   joint pain or swelling.  Neuro-     +Vertigo- uses wheelchair Psych:  No- change in mood or affect. No depression or anxiety.  No memory loss.  OBJ    95% sat room air on arrival General- Alert, Oriented, Affect-appropriate, Distress- none acute,   +wheelchair     Skin- rash-none, lesions- none, excoriation- none Lymphadenopathy- none Head- atraumatic            Eyes- Gross vision intact, PERRLA, conjunctivae -pale            Ears- Hearing, canals-normal            Nose- turbinate edema, no-Septal dev, mucus, polyps, erosion, perforation             Throat- Mallampati II-III , mucosa clear , drainage- none, tonsils- atrophic Neck- flexible ,  trachea midline, no stridor , thyroid nl, carotid no bruit Chest - symmetrical excursion , unlabored, +  crackles to mid-back           Heart/CV-+ slow irregularly irregular/ AFib , 2-3/6 precordial systolic murmur , no gallop  , no rub, Nl s1 s2                 - JVD+full , edema +1, stasis changes- none, varices- none           Lung- + Coarse rhonchi/squeaks especially right base, no wheeze , cough-none , dullness-none, rub- none           Chest wall- healed VATS thoracotomy incision L Abd-   Br/ Gen/ Rectal- Not done, not indicated Extrem- cyanosis- none, clubbing, none, atrophy- none, strength- nl. + Wheelchair for distance/ cane at home.  Neuro- grossly intact to observation

## 2018-03-11 NOTE — Patient Instructions (Addendum)
Order- schedule CT chest Hi Resolution, no contrast   Dx interstital lung disease, Wegener's  Refill script sent for prednisone  Please call if we can help

## 2018-03-11 NOTE — Assessment & Plan Note (Signed)
CT scan in June suggests possible progression despite maintenance prednisone. Plan-follow-up CT chest high resolution.  Continue prednisone 5 mg daily

## 2018-03-15 ENCOUNTER — Emergency Department (HOSPITAL_COMMUNITY): Payer: Medicare Other

## 2018-03-15 ENCOUNTER — Other Ambulatory Visit: Payer: Self-pay

## 2018-03-15 ENCOUNTER — Inpatient Hospital Stay (HOSPITAL_COMMUNITY): Payer: Medicare Other

## 2018-03-15 ENCOUNTER — Encounter (HOSPITAL_COMMUNITY): Payer: Self-pay

## 2018-03-15 ENCOUNTER — Inpatient Hospital Stay (HOSPITAL_COMMUNITY)
Admission: EM | Admit: 2018-03-15 | Discharge: 2018-03-21 | DRG: 208 | Disposition: A | Discharge status: Home Health | Payer: Medicare Other | Attending: Internal Medicine | Admitting: Internal Medicine

## 2018-03-15 DIAGNOSIS — E1149 Type 2 diabetes mellitus with other diabetic neurological complication: Secondary | ICD-10-CM | POA: Diagnosis present

## 2018-03-15 DIAGNOSIS — R0689 Other abnormalities of breathing: Secondary | ICD-10-CM | POA: Diagnosis not present

## 2018-03-15 DIAGNOSIS — E1159 Type 2 diabetes mellitus with other circulatory complications: Secondary | ICD-10-CM | POA: Diagnosis not present

## 2018-03-15 DIAGNOSIS — Z885 Allergy status to narcotic agent status: Secondary | ICD-10-CM

## 2018-03-15 DIAGNOSIS — R918 Other nonspecific abnormal finding of lung field: Secondary | ICD-10-CM | POA: Diagnosis not present

## 2018-03-15 DIAGNOSIS — M3130 Wegener's granulomatosis without renal involvement: Secondary | ICD-10-CM | POA: Diagnosis not present

## 2018-03-15 DIAGNOSIS — N19 Unspecified kidney failure: Secondary | ICD-10-CM | POA: Diagnosis not present

## 2018-03-15 DIAGNOSIS — Z803 Family history of malignant neoplasm of breast: Secondary | ICD-10-CM

## 2018-03-15 DIAGNOSIS — R0489 Hemorrhage from other sites in respiratory passages: Secondary | ICD-10-CM | POA: Diagnosis present

## 2018-03-15 DIAGNOSIS — M3131 Wegener's granulomatosis with renal involvement: Secondary | ICD-10-CM | POA: Diagnosis not present

## 2018-03-15 DIAGNOSIS — I130 Hypertensive heart and chronic kidney disease with heart failure and stage 1 through stage 4 chronic kidney disease, or unspecified chronic kidney disease: Secondary | ICD-10-CM | POA: Diagnosis present

## 2018-03-15 DIAGNOSIS — Z9981 Dependence on supplemental oxygen: Secondary | ICD-10-CM

## 2018-03-15 DIAGNOSIS — T380X5A Adverse effect of glucocorticoids and synthetic analogues, initial encounter: Secondary | ICD-10-CM | POA: Diagnosis not present

## 2018-03-15 DIAGNOSIS — I482 Chronic atrial fibrillation, unspecified: Secondary | ICD-10-CM | POA: Diagnosis present

## 2018-03-15 DIAGNOSIS — E039 Hypothyroidism, unspecified: Secondary | ICD-10-CM | POA: Diagnosis present

## 2018-03-15 DIAGNOSIS — E1165 Type 2 diabetes mellitus with hyperglycemia: Secondary | ICD-10-CM | POA: Diagnosis not present

## 2018-03-15 DIAGNOSIS — I77819 Aortic ectasia, unspecified site: Secondary | ICD-10-CM | POA: Diagnosis present

## 2018-03-15 DIAGNOSIS — Z923 Personal history of irradiation: Secondary | ICD-10-CM

## 2018-03-15 DIAGNOSIS — N184 Chronic kidney disease, stage 4 (severe): Secondary | ICD-10-CM | POA: Diagnosis present

## 2018-03-15 DIAGNOSIS — J180 Bronchopneumonia, unspecified organism: Secondary | ICD-10-CM | POA: Diagnosis present

## 2018-03-15 DIAGNOSIS — J189 Pneumonia, unspecified organism: Secondary | ICD-10-CM | POA: Diagnosis not present

## 2018-03-15 DIAGNOSIS — Z882 Allergy status to sulfonamides status: Secondary | ICD-10-CM

## 2018-03-15 DIAGNOSIS — Z4659 Encounter for fitting and adjustment of other gastrointestinal appliance and device: Secondary | ICD-10-CM

## 2018-03-15 DIAGNOSIS — Z9049 Acquired absence of other specified parts of digestive tract: Secondary | ICD-10-CM

## 2018-03-15 DIAGNOSIS — Z85110 Personal history of malignant carcinoid tumor of bronchus and lung: Secondary | ICD-10-CM

## 2018-03-15 DIAGNOSIS — I4891 Unspecified atrial fibrillation: Secondary | ICD-10-CM | POA: Diagnosis not present

## 2018-03-15 DIAGNOSIS — E785 Hyperlipidemia, unspecified: Secondary | ICD-10-CM | POA: Diagnosis present

## 2018-03-15 DIAGNOSIS — Z7952 Long term (current) use of systemic steroids: Secondary | ICD-10-CM | POA: Diagnosis not present

## 2018-03-15 DIAGNOSIS — F329 Major depressive disorder, single episode, unspecified: Secondary | ICD-10-CM | POA: Diagnosis present

## 2018-03-15 DIAGNOSIS — R0902 Hypoxemia: Secondary | ICD-10-CM | POA: Diagnosis not present

## 2018-03-15 DIAGNOSIS — E1122 Type 2 diabetes mellitus with diabetic chronic kidney disease: Secondary | ICD-10-CM | POA: Diagnosis present

## 2018-03-15 DIAGNOSIS — Z888 Allergy status to other drugs, medicaments and biological substances status: Secondary | ICD-10-CM

## 2018-03-15 DIAGNOSIS — Z87891 Personal history of nicotine dependence: Secondary | ICD-10-CM

## 2018-03-15 DIAGNOSIS — R0602 Shortness of breath: Secondary | ICD-10-CM | POA: Diagnosis not present

## 2018-03-15 DIAGNOSIS — N179 Acute kidney failure, unspecified: Secondary | ICD-10-CM | POA: Diagnosis present

## 2018-03-15 DIAGNOSIS — K760 Fatty (change of) liver, not elsewhere classified: Secondary | ICD-10-CM | POA: Diagnosis present

## 2018-03-15 DIAGNOSIS — Z79890 Hormone replacement therapy (postmenopausal): Secondary | ICD-10-CM

## 2018-03-15 DIAGNOSIS — Z7901 Long term (current) use of anticoagulants: Secondary | ICD-10-CM

## 2018-03-15 DIAGNOSIS — Z8261 Family history of arthritis: Secondary | ICD-10-CM

## 2018-03-15 DIAGNOSIS — I272 Pulmonary hypertension, unspecified: Secondary | ICD-10-CM | POA: Diagnosis present

## 2018-03-15 DIAGNOSIS — Z853 Personal history of malignant neoplasm of breast: Secondary | ICD-10-CM

## 2018-03-15 DIAGNOSIS — R5381 Other malaise: Secondary | ICD-10-CM | POA: Diagnosis present

## 2018-03-15 DIAGNOSIS — R05 Cough: Secondary | ICD-10-CM | POA: Diagnosis not present

## 2018-03-15 DIAGNOSIS — R531 Weakness: Secondary | ICD-10-CM | POA: Diagnosis not present

## 2018-03-15 DIAGNOSIS — D631 Anemia in chronic kidney disease: Secondary | ICD-10-CM | POA: Diagnosis present

## 2018-03-15 DIAGNOSIS — I5032 Chronic diastolic (congestive) heart failure: Secondary | ICD-10-CM | POA: Diagnosis present

## 2018-03-15 DIAGNOSIS — J9601 Acute respiratory failure with hypoxia: Secondary | ICD-10-CM | POA: Diagnosis not present

## 2018-03-15 DIAGNOSIS — J9690 Respiratory failure, unspecified, unspecified whether with hypoxia or hypercapnia: Secondary | ICD-10-CM | POA: Diagnosis not present

## 2018-03-15 DIAGNOSIS — Z881 Allergy status to other antibiotic agents status: Secondary | ICD-10-CM

## 2018-03-15 DIAGNOSIS — I10 Essential (primary) hypertension: Secondary | ICD-10-CM | POA: Diagnosis not present

## 2018-03-15 DIAGNOSIS — Z8249 Family history of ischemic heart disease and other diseases of the circulatory system: Secondary | ICD-10-CM

## 2018-03-15 DIAGNOSIS — J181 Lobar pneumonia, unspecified organism: Secondary | ICD-10-CM | POA: Diagnosis present

## 2018-03-15 DIAGNOSIS — R6520 Severe sepsis without septic shock: Secondary | ICD-10-CM | POA: Diagnosis present

## 2018-03-15 DIAGNOSIS — Z8673 Personal history of transient ischemic attack (TIA), and cerebral infarction without residual deficits: Secondary | ICD-10-CM

## 2018-03-15 DIAGNOSIS — Z801 Family history of malignant neoplasm of trachea, bronchus and lung: Secondary | ICD-10-CM

## 2018-03-15 DIAGNOSIS — Z978 Presence of other specified devices: Secondary | ICD-10-CM

## 2018-03-15 DIAGNOSIS — Z79899 Other long term (current) drug therapy: Secondary | ICD-10-CM

## 2018-03-15 DIAGNOSIS — Z794 Long term (current) use of insulin: Secondary | ICD-10-CM

## 2018-03-15 DIAGNOSIS — Z4682 Encounter for fitting and adjustment of non-vascular catheter: Secondary | ICD-10-CM | POA: Diagnosis not present

## 2018-03-15 DIAGNOSIS — R042 Hemoptysis: Secondary | ICD-10-CM | POA: Diagnosis not present

## 2018-03-15 LAB — COMPREHENSIVE METABOLIC PANEL
ALK PHOS: 55 U/L (ref 38–126)
ALT: 10 U/L (ref 0–44)
AST: 16 U/L (ref 15–41)
Albumin: 3.5 g/dL (ref 3.5–5.0)
Anion gap: 10 (ref 5–15)
BUN: 36 mg/dL — AB (ref 8–23)
CALCIUM: 8.1 mg/dL — AB (ref 8.9–10.3)
CO2: 23 mmol/L (ref 22–32)
Chloride: 99 mmol/L (ref 98–111)
Creatinine, Ser: 2.04 mg/dL — ABNORMAL HIGH (ref 0.44–1.00)
GFR calc Af Amer: 25 mL/min — ABNORMAL LOW (ref >60)
GFR calc non Af Amer: 22 mL/min — ABNORMAL LOW (ref >60)
Glucose, Bld: 150 mg/dL — ABNORMAL HIGH (ref 70–99)
Potassium: 4.1 mmol/L (ref 3.5–5.1)
Sodium: 132 mmol/L — ABNORMAL LOW (ref 135–145)
Total Bilirubin: 1.3 mg/dL — ABNORMAL HIGH (ref 0.3–1.2)
Total Protein: 6.7 g/dL (ref 6.5–8.1)

## 2018-03-15 LAB — BLOOD GAS, ARTERIAL
Acid-base deficit: 0.8 mmol/L (ref 0.0–2.0)
Bicarbonate: 24.4 mmol/L (ref 20.0–28.0)
DRAWN BY: 232811
FIO2: 100
MECHVT: 470 mL
O2 Saturation: 99.5 %
PEEP: 5 cmH2O
Patient temperature: 38.6
RATE: 22 resp/min
pCO2 arterial: 48.6 mmHg — ABNORMAL HIGH (ref 32.0–48.0)
pH, Arterial: 7.33 — ABNORMAL LOW (ref 7.350–7.450)
pO2, Arterial: 353 mmHg — ABNORMAL HIGH (ref 83.0–108.0)

## 2018-03-15 LAB — PROTIME-INR
INR: 1.49
Prothrombin Time: 17.9 seconds — ABNORMAL HIGH (ref 11.4–15.2)

## 2018-03-15 LAB — CBC WITH DIFFERENTIAL/PLATELET
Abs Immature Granulocytes: 0.04 10*3/uL (ref 0.00–0.07)
Basophils Absolute: 0.1 10*3/uL (ref 0.0–0.1)
Basophils Relative: 1 %
Eosinophils Absolute: 0.1 10*3/uL (ref 0.0–0.5)
Eosinophils Relative: 2 %
HCT: 22.8 % — ABNORMAL LOW (ref 36.0–46.0)
Hemoglobin: 7.2 g/dL — ABNORMAL LOW (ref 12.0–15.0)
Immature Granulocytes: 1 %
Lymphocytes Relative: 11 %
Lymphs Abs: 0.9 10*3/uL (ref 0.7–4.0)
MCH: 30.9 pg (ref 26.0–34.0)
MCHC: 31.6 g/dL (ref 30.0–36.0)
MCV: 97.9 fL (ref 80.0–100.0)
MONOS PCT: 11 %
Monocytes Absolute: 0.9 10*3/uL (ref 0.1–1.0)
Neutro Abs: 6.6 10*3/uL (ref 1.7–7.7)
Neutrophils Relative %: 74 %
Platelets: 210 10*3/uL (ref 150–400)
RBC: 2.33 MIL/uL — ABNORMAL LOW (ref 3.87–5.11)
RDW: 13.6 % (ref 11.5–15.5)
WBC: 8.6 10*3/uL (ref 4.0–10.5)
nRBC: 0 % (ref 0.0–0.2)

## 2018-03-15 LAB — URINALYSIS, ROUTINE W REFLEX MICROSCOPIC
Bilirubin Urine: NEGATIVE
Glucose, UA: NEGATIVE mg/dL
Ketones, ur: NEGATIVE mg/dL
Leukocytes, UA: NEGATIVE
Nitrite: NEGATIVE
Protein, ur: 30 mg/dL — AB
Specific Gravity, Urine: 1.009 (ref 1.005–1.030)
pH: 7 (ref 5.0–8.0)

## 2018-03-15 LAB — I-STAT CG4 LACTIC ACID, ED: LACTIC ACID, VENOUS: 1.04 mmol/L (ref 0.5–1.9)

## 2018-03-15 LAB — I-STAT TROPONIN, ED: Troponin i, poc: 0.01 ng/mL (ref 0.00–0.08)

## 2018-03-15 LAB — BRAIN NATRIURETIC PEPTIDE: B Natriuretic Peptide: 1123.7 pg/mL — ABNORMAL HIGH (ref 0.0–100.0)

## 2018-03-15 LAB — GLUCOSE, CAPILLARY: Glucose-Capillary: 183 mg/dL — ABNORMAL HIGH (ref 70–99)

## 2018-03-15 LAB — MRSA PCR SCREENING: MRSA by PCR: NEGATIVE

## 2018-03-15 MED ORDER — FAMOTIDINE IN NACL 20-0.9 MG/50ML-% IV SOLN
20.0000 mg | INTRAVENOUS | Status: DC
  Administered 2018-03-15: 20 mg via INTRAVENOUS
  Filled 2018-03-15: qty 50

## 2018-03-15 MED ORDER — FUROSEMIDE 20 MG PO TABS
20.0000 mg | ORAL_TABLET | Freq: Every day | ORAL | Status: DC
  Administered 2018-03-15 – 2018-03-16 (×2): 20 mg via ORAL
  Filled 2018-03-15 (×2): qty 1

## 2018-03-15 MED ORDER — PIPERACILLIN-TAZOBACTAM 3.375 G IVPB
3.3750 g | Freq: Once | INTRAVENOUS | Status: DC
  Administered 2018-03-15: 3.375 g via INTRAVENOUS
  Filled 2018-03-15: qty 50

## 2018-03-15 MED ORDER — ACETAMINOPHEN 325 MG PO TABS: 650.0000 mg | ORAL_TABLET | Freq: Four times a day (QID) | ORAL | Status: DC | PRN

## 2018-03-15 MED ORDER — ORAL CARE MOUTH RINSE
15.0000 mL | OROMUCOSAL | Status: DC
  Administered 2018-03-15 – 2018-03-17 (×14): 15 mL via OROMUCOSAL

## 2018-03-15 MED ORDER — METHYLPREDNISOLONE SODIUM SUCC 125 MG IJ SOLR: 60.0000 mg | Freq: Every day | INTRAMUSCULAR | Status: DC

## 2018-03-15 MED ORDER — MIDAZOLAM HCL 2 MG/2ML IJ SOLN
INTRAMUSCULAR | Status: AC
  Filled 2018-03-15: qty 4

## 2018-03-15 MED ORDER — LEVOTHYROXINE SODIUM 100 MCG PO TABS
100.0000 ug | ORAL_TABLET | Freq: Every day | ORAL | Status: DC
  Administered 2018-03-16: 100 ug via ORAL
  Filled 2018-03-15: qty 1

## 2018-03-15 MED ORDER — FENTANYL CITRATE (PF) 100 MCG/2ML IJ SOLN: 50.0000 ug | INTRAMUSCULAR | Status: DC | PRN

## 2018-03-15 MED ORDER — ONDANSETRON HCL 4 MG PO TABS: 4.0000 mg | ORAL_TABLET | Freq: Four times a day (QID) | ORAL | Status: DC | PRN

## 2018-03-15 MED ORDER — ENSURE ENLIVE PO LIQD: 237.0000 mL | Freq: Two times a day (BID) | ORAL | Status: DC

## 2018-03-15 MED ORDER — GUAIFENESIN ER 600 MG PO TB12: 600.0000 mg | ORAL_TABLET | Freq: Two times a day (BID) | ORAL | Status: DC | PRN

## 2018-03-15 MED ORDER — DEXMEDETOMIDINE HCL IN NACL 200 MCG/50ML IV SOLN
0.0000 ug/kg/h | INTRAVENOUS | Status: DC
  Administered 2018-03-15: 0.4 ug/kg/h via INTRAVENOUS
  Administered 2018-03-16: 0.6 ug/kg/h via INTRAVENOUS
  Administered 2018-03-16: 0.4 ug/kg/h via INTRAVENOUS
  Administered 2018-03-16: 1 ug/kg/h via INTRAVENOUS
  Administered 2018-03-16: 0.9 ug/kg/h via INTRAVENOUS
  Administered 2018-03-16: 1.003 ug/kg/h via INTRAVENOUS
  Administered 2018-03-16 – 2018-03-17 (×3): 1 ug/kg/h via INTRAVENOUS
  Filled 2018-03-15 (×9): qty 50

## 2018-03-15 MED ORDER — ONDANSETRON HCL 4 MG/2ML IJ SOLN: 4.0000 mg | Freq: Four times a day (QID) | INTRAMUSCULAR | Status: DC | PRN

## 2018-03-15 MED ORDER — SODIUM CHLORIDE 0.9 % IV SOLN
250.0000 mg | Freq: Four times a day (QID) | INTRAVENOUS | Status: DC
  Administered 2018-03-15 – 2018-03-19 (×15): 250 mg via INTRAVENOUS
  Filled 2018-03-15 (×3): qty 250
  Filled 2018-03-15: qty 2
  Filled 2018-03-15 (×12): qty 250

## 2018-03-15 MED ORDER — NEBIVOLOL HCL 10 MG PO TABS
10.0000 mg | ORAL_TABLET | Freq: Every day | ORAL | Status: DC
  Administered 2018-03-16: 10 mg via ORAL
  Filled 2018-03-15: qty 1

## 2018-03-15 MED ORDER — ACETAMINOPHEN 650 MG RE SUPP: 650.0000 mg | Freq: Four times a day (QID) | RECTAL | Status: DC | PRN

## 2018-03-15 MED ORDER — SODIUM CHLORIDE 0.9 % IV BOLUS
500.0000 mL | Freq: Once | INTRAVENOUS | Status: AC
  Administered 2018-03-15: 10 mL via INTRAVENOUS

## 2018-03-15 MED ORDER — FENTANYL CITRATE (PF) 100 MCG/2ML IJ SOLN
INTRAMUSCULAR | Status: AC
  Filled 2018-03-15: qty 2

## 2018-03-15 MED ORDER — FENTANYL CITRATE (PF) 100 MCG/2ML IJ SOLN
100.0000 ug | Freq: Once | INTRAMUSCULAR | Status: AC
  Administered 2018-03-15: 50 ug via INTRAVENOUS

## 2018-03-15 MED ORDER — ETOMIDATE 2 MG/ML IV SOLN
0.3000 mg/kg | Freq: Once | INTRAVENOUS | Status: AC
  Administered 2018-03-15: 20 mg via INTRAVENOUS

## 2018-03-15 MED ORDER — MIDAZOLAM HCL 2 MG/2ML IJ SOLN
1.0000 mg | INTRAMUSCULAR | Status: DC | PRN
  Administered 2018-03-16 – 2018-03-17 (×3): 1 mg via INTRAVENOUS
  Filled 2018-03-15 (×3): qty 2

## 2018-03-15 MED ORDER — MIDAZOLAM HCL 2 MG/2ML IJ SOLN: 1.0000 mg | INTRAMUSCULAR | Status: DC | PRN

## 2018-03-15 MED ORDER — ROCURONIUM BROMIDE 50 MG/5ML IV SOLN
1.0000 mg/kg | Freq: Once | INTRAVENOUS | Status: AC
  Administered 2018-03-15: 70 mg via INTRAVENOUS
  Filled 2018-03-15: qty 6.86

## 2018-03-15 MED ORDER — SODIUM CHLORIDE 0.9 % IV SOLN
1.0000 g | Freq: Once | INTRAVENOUS | Status: AC
  Administered 2018-03-15: 1 g via INTRAVENOUS
  Filled 2018-03-15: qty 10

## 2018-03-15 MED ORDER — SODIUM CHLORIDE 0.9 % IV SOLN
1.0000 g | INTRAVENOUS | Status: AC
  Administered 2018-03-16 – 2018-03-19 (×4): 1 g via INTRAVENOUS
  Filled 2018-03-15 (×4): qty 1

## 2018-03-15 MED ORDER — APIXABAN 2.5 MG PO TABS
2.5000 mg | ORAL_TABLET | Freq: Two times a day (BID) | ORAL | Status: DC
  Administered 2018-03-15: 2.5 mg via ORAL
  Filled 2018-03-15: qty 1

## 2018-03-15 MED ORDER — CHLORHEXIDINE GLUCONATE 0.12% ORAL RINSE (MEDLINE KIT)
15.0000 mL | Freq: Two times a day (BID) | OROMUCOSAL | Status: DC
  Administered 2018-03-15 – 2018-03-17 (×4): 15 mL via OROMUCOSAL

## 2018-03-15 MED ORDER — GABAPENTIN 100 MG PO CAPS
100.0000 mg | ORAL_CAPSULE | Freq: Two times a day (BID) | ORAL | Status: DC
  Administered 2018-03-15 – 2018-03-16 (×3): 100 mg via ORAL
  Filled 2018-03-15 (×3): qty 1

## 2018-03-15 MED ORDER — PREDNISONE 5 MG PO TABS: 5.0000 mg | ORAL_TABLET | Freq: Every day | ORAL | Status: DC

## 2018-03-15 MED ORDER — MIDAZOLAM HCL 2 MG/2ML IJ SOLN
4.0000 mg | Freq: Once | INTRAMUSCULAR | Status: AC
  Administered 2018-03-15: 2 mg via INTRAVENOUS

## 2018-03-15 MED ORDER — AZITHROMYCIN 250 MG PO TABS
500.0000 mg | ORAL_TABLET | Freq: Every day | ORAL | Status: DC
  Administered 2018-03-15 – 2018-03-16 (×2): 500 mg via ORAL
  Filled 2018-03-15 (×2): qty 2

## 2018-03-15 NOTE — Consult Note (Signed)
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 Annamaria Boots who presented with shortness of breath and hemoptysis.  History of present illness   84 year old female with a past medical history of small vessel vasculitis, carcinoid tumor of the lung, breast cancer who has been treated with chronic prednisone for at least 6 years presented to our facility with increasing fatigue and shortness of breath.  She also noticed the sudden onset of hemoptysis about 1 day prior to admission.  She tells me that she denies any fever, chills, headache, body aches, arthralgias, rash, or trouble swallowing.  She denies any sick contacts.  She says that the symptoms started over the last 3 to 4 days.  She last saw my partner on March 11, 2018 and she said that she felt fine that day.  However not long after that her symptoms started.  She has been coughing up blood since.  She denies current epistaxis.  However, she says about 2 weeks ago she had a severe headache and an episode of epistaxis that lasted for several minutes but then resolved and has not recurred since.  She was admitted by the hospitalist service for further evaluation of worsening shortness of breath, abnormal chest imaging.  Past Medical History  Small vessel vasculitis, proteinase 3 antibody positive, pulmonary hypertension, carcinoid tumor, diabetes mellitus type 2, stroke, A. fib,  Significant Hospital Events     Consults:  December 9 pulmonary consult  Procedures:  March 15, 2018 chest x-ray showing consolidation in the left upper lobe as well as the right upper lobe, diffuse bilateral airspace disease, images  personally reviewed  Significant Diagnostic Tests:    Micro Data:  December 9 blood culture  Antimicrobials:  December night ceftriaxone December 9 azithromycin  Interim history/subjective:  As above  Objective   Blood pressure (!) 152/66, pulse 67, temperature 97.7 F (36.5 C), temperature source Oral, resp. rate (!) 30, height _0  (1.676 m), weight 68.6 kg, SpO2 95 %.        Intake/Output Summary (Last 24 hours) at 03/15/2018 1538 Last data filed at 03/15/2018 1204 Gross per 24 hour  Intake 1100 ml  Output -  Net 1100 ml   Filed Weights   03/15/18 0905 03/15/18 1500  Weight: 70.7 kg 68.6 kg    Examination:  Gen: chronically ill appearing HENT: OP clear, TM's clear, neck supple PULM: Coarse crackles and upper airway rhonchi bilaterally, mild tachypnea, speaking in full sentences, not using accessory respiratory muscles or breathing paradoxically CV: Irreg irreg, no mgr, trace edema GI: BS+, soft, nontender Derm: no cyanosis or rash Psyche: normal mood and affect   Resolved Hospital Problem list     Assessment & Plan:  Acute respiratory failure with hypoxemia: We will treat this as if it is severe community-acquired pneumonia but I am most worried that this is recurrent small vessel vasculitis. Administer O2 to maintain O2 saturation greater than 88% Moved to intensive care unit for close monitoring High-resolution CT chest now Increase IV Solu-Medrol dosing to 250 mg IV every 6 hours Consider rituximab (this was needed when she had a flare of her disease in 2014) May need bronchoscopy, follow-up CT  chest first Continue ceftriaxone and azithromycin Check proteinase 3 antibody  Afib Hold home eliquis with hemoptysis  Hemoptysis Treatment as above May need bronchoscopy  Acute on chronic kidney failure: worrisome for small vessel vasculitis Check U/A Monitor uop Monitor renal function  History of carcinoid tumor f/u HRCT  History of breast  cancer F/u HRCT  Best practice:  Diet: regular diet Pain/Anxiety/Delirium protocol (if indicated): n/a VAP protocol (if indicated): n/a DVT prophylaxis: SCD GI prophylaxis:  Glucose control:  Mobility:  Code Status: full Family Communication: I tried to call her family, could not leave voicemail on number listed Disposition: to ICU  Labs   CBC: Recent Labs  Lab 03/15/18 0913  WBC 8.6  NEUTROABS 6.6  HGB 7.2*  HCT 22.8*  MCV 97.9  PLT 818    Basic Metabolic Panel: Recent Labs  Lab 03/15/18 0913  NA 132*  K 4.1  CL 99  CO2 23  GLUCOSE 150*  BUN 36*  CREATININE 2.04*  CALCIUM 8.1*   GFR: Estimated Creatinine Clearance: 19.6 mL/min (A) (by C-G formula based on SCr of 2.04 mg/dL (H)). Recent Labs  Lab 03/15/18 0913  WBC 8.6  LATICACIDVEN 1.04    Liver Function Tests: Recent Labs  Lab 03/15/18 0913  AST 16  ALT 10  ALKPHOS 55  BILITOT 1.3*  PROT 6.7  ALBUMIN 3.5   No results for input(s): LIPASE, AMYLASE in the last 168 hours. No results for input(s): AMMONIA in the last 168 hours.  ABG    Component Value Date/Time   PHART 7.457 (H) 08/18/2013 0814   PCO2ART 33.5 (L) 08/18/2013 0814   PO2ART 80.8 08/18/2013 0814   HCO3 23.3 08/18/2013 0814   TCO2 23 09/13/2017 2030   ACIDBASEDEF 1.1 04/18/2011 1351   O2SAT 96.3 08/18/2013 0814     Coagulation Profile: Recent Labs  Lab 03/15/18 0913  INR 1.49    Cardiac Enzymes: No results for input(s): CKTOTAL, CKMB, CKMBINDEX, TROPONINI in the last 168 hours.  HbA1C: Hgb A1c MFr Bld  Date/Time Value Ref Range Status  12/22/2017 03:28 PM 6.3 4.6 - 6.5 % Final    Comment:    Glycemic Control Guidelines for People with Diabetes:Non Diabetic:  <6%Goal of Therapy: <7%Additional Action Suggested:  >8%   09/23/2017 12:01 PM 6.8 (H) 4.6 - 6.5 % Final    Comment:    Glycemic Control Guidelines for People with Diabetes:Non Diabetic:  <6%Goal of Therapy: <7%Additional Action Suggested:  >8%     CBG: No  results for input(s): GLUCAP in the last 168 hours.  Review of Systems:   Gen: Denies fever, chills, weight change, + fatigue, night sweats HEENT: Denies blurred vision, double vision, hearing loss, tinnitus, sinus congestion, rhinorrhea, sore throat, neck stiffness, dysphagia PULM:per HPI CV: Denies chest pain, edema, orthopnea, paroxysmal nocturnal dyspnea, palpitations GI: Denies abdominal pain, nausea, vomiting, diarrhea, hematochezia, melena, constipation, change in bowel habits GU: Denies dysuria, hematuria, polyuria, oliguria, urethral discharge Endocrine: Denies hot or cold intolerance, polyuria, polyphagia or appetite change Derm: Denies rash, dry skin, scaling or peeling skin change Heme: Denies easy bruising, bleeding, bleeding gums Neuro: Denies headache, numbness, weakness, slurred speech, loss of memory or consciousness   Past Medical History  She,  has a past medical history of Arthritis, Breast cancer ([place]), Chronic atrial fibrillation, CVA (cerebral vascular accident) ([place]) (2008), DEPRESSION (05/16/2009), Diabetes mellitus type II, Heart murmur, HYPERLIPIDEMIA (07/24/2008), HYPERTENSION (07/24/2008), HYPOTHYROIDISM (07/24/2008), Intermittent vertigo, Metastatic carcinoid tumor to intra-abdominal site [facility]), Pneumonia, Pulmonary hypertension ([place]),  Skin abnormality, Vertigo, and Wegener's disease, 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   Surgical History    Past Surgical History:  Procedure Laterality Date  . BREAST SURGERY  2007   Lumpectomy, XRT 2006.l breast  . CHOLECYSTECTOMY  1980  . EXPLORATORY LAPAROTOMY    . HEEL SPUR SURGERY Right   . JOINT REPLACEMENT     r knee  . KNEE SURGERY  2009   TKR  . TEE WITHOUT CARDIOVERSION N/A 09/16/2017   Procedure: TRANSESOPHAGEAL ECHOCARDIOGRAM (TEE);  Surgeon: Larey Dresser, MD;  Location: [facility] ENDOSCOPY;  Service: Cardiovascular;  Laterality: N/A;  . TRANSTHORACIC ECHOCARDIOGRAM  03/2016   Afib (no Diastolic Fxn). EF 55-60%. No RWMA. Mild  Aortic dilation Mild MR. Severe LA dilation. Mod RA dilation.  Mod TR with Mod-Severe Pulmonary HTN - but normal RV function..  . VENTRAL HERNIA REPAIR N/A 07/01/2016   Procedure: REPAIR OF INCARCERATED INCISIONAL HERNIA ;  Surgeon: Stark Klein, MD;  Location: [facility] ORS;  Service: General;  Laterality: N/A;  . VIDEO ASSISTED THORACOSCOPY  04/22/2011   Procedure: VIDEO ASSISTED THORACOSCOPY;  Surgeon: Melrose Nakayama, MD;  Location: [place];  Service: Thoracic;  Laterality: Left;  WITH BIOPSY     Social History   reports that she quit smoking about 35 years ago. Her smoking use included cigarettes. She has a 6.00 pack-year smoking history. She has never used smokeless tobacco. She reports that she does not drink alcohol or use drugs.   Family History   Her family history includes Arthritis in her mother; Breast cancer in her sister; Cancer in her sister; Coronary artery disease in her brother and father; Heart disease in her father; Lung cancer in her brother; Rheum arthritis in her mother.   Allergies Allergies  Allergen Reactions  . Codeine Sulfate Other (See Comments)    REACTION: GI upset  . Sulfonamide Derivatives Other (See Comments)    REACTION: GI upset  . Vancomycin Other (See Comments)    Red man syndrome  . Atarax [Hydroxyzine] Nausea Only and Rash     Home Medications  Prior to Admission medications   Medication Sig Start Date End Date Taking? Authorizing Provider  acetaminophen (TYLENOL) 500 MG tablet Take 1,000 mg by mouth every 6 (six) hours as needed for headache.    Yes [provider]  amLODipine (NORVASC) 5 MG tablet Take 5 mg by mouth at bedtime.  09/25/17  Yes [provider]  BYSTOLIC 10 MG tablet TAKE 1 TABLET BY MOUTH ONCE DAILY AFTER  BREAKFAST Patient taking differently: Take 10 mg by mouth daily.  02/22/18  Yes Burchette, Alinda Sierras, MD  diazepam (VALIUM) 5 MG tablet TAKE 1 TABLET BY MOUTH EVERY 12 HOURS AS NEEDED FOR SEVERE VERTIGO FLARES. AVOID  REGULAR USE. Patient taking differently: Take 2.5-5 mg by mouth every 12 (twelve) hours as needed (vertigo).  11/02/17  Yes Burchette, Alinda Sierras, MD  ELIQUIS 2.5 MG TABS tablet TAKE 1 TABLET BY MOUTH TWICE DAILY Patient taking differently: Take 2.5 mg by mouth 2 (two) times daily.  08/26/17  Yes Burchette, Alinda Sierras, MD  furosemide (LASIX) 20 MG tablet Take 1 tablet (20 mg total) by mouth daily. 09/04/16  Yes Burchette, Alinda Sierras, MD  gabapentin (NEURONTIN) 100 MG capsule TAKE 1 CAPSULE BY MOUTH TWICE DAILY BEFORE MEAL(S) Patient taking differently: Take 100 mg by mouth 2 (two) times daily at 8 am and 10 pm.  10/29/17  Yes Burchette, Alinda Sierras, MD  glimepiride (AMARYL) 2 MG tablet TAKE ONE  TABLET BY MOUTH ONCE DAILY BEFORE BREAKFAST Patient taking differently: Take 2 mg by mouth daily as needed (high blood sugar).  10/07/16  Yes Burchette, Alinda Sierras, MD  guaiFENesin (MUCINEX) 600 MG 12 hr tablet Take 1 tablet (600 mg total) by mouth 2 (two) times daily as needed for cough. 09/17/17  Yes Debbe Odea, MD  levothyroxine (SYNTHROID, LEVOTHROID) 100 MCG tablet TAKE 1 TABLET BY MOUTH ONCE DAILY BEFORE BREAKFAST. Patient taking differently: Take 100 mcg by mouth daily before breakfast.  07/27/17  Yes Burchette, Alinda Sierras, MD  Multiple Vitamins-Minerals (CENTRUM SILVER 50+WOMEN) TABS Take 1 tablet by mouth 3 (three) times a week.   Yes [provider]  predniSONE (DELTASONE) 5 MG tablet TAKE 1 TABLET BY MOUTH ONCE DAILY WITH  BREAKFAST Patient taking differently: Take 5 mg by mouth daily with breakfast.  03/11/18  Yes Young, Clinton D, MD  feeding supplement, ENSURE ENLIVE, (ENSURE ENLIVE) LIQD Take 237 mLs by mouth 2 (two) times daily between meals. Patient not taking: Reported on 03/15/2018 09/18/17   Debbe Odea, MD  ondansetron (ZOFRAN-ODT) 4 MG disintegrating tablet Take 1 tablet (4 mg total) by mouth every 8 (eight) hours as needed for nausea or vomiting. Patient not taking: Reported on 03/15/2018 09/18/17    [name], MD     Critical care time:     Roselie Awkward, MD [place] [facility] Pager: [phone] Cell: [phone] If no response, call [phone]

## 2018-03-15 NOTE — ED Notes (Signed)
ADMISSION ARRIEN MD Provider at bedside.

## 2018-03-15 NOTE — ED Notes (Signed)
Pure wick in place

## 2018-03-15 NOTE — ED Notes (Signed)
ED TO INPATIENT HANDOFF REPORT  Name/Age/Gender [name] [name] 84 y.o. female  Code Status    Code Status Orders  (From admission, onward)         Start     Ordered   03/15/18 1214  Full code  Continuous     03/15/18 1213        Code Status History    Date Active Date Inactive Code Status Order ID Comments User Context   09/13/2017 2325 09/17/2017 1831 Full Code [id number]  Elwyn Reach, MD Inpatient   09/29/2016 1148 10/01/2016 1742 Full Code [id number]  Verlee Monte, MD Inpatient   06/30/2016 1648 07/05/2016 1612 Full Code [id number]  Debbe Odea, MD ED   04/02/2016 1818 04/05/2016 1622 Full Code [id number]  Samuella Cota, MD Inpatient   12/10/2015 1549 12/13/2015 1847 Full Code [id number]  Tawni Millers, MD Inpatient   09/25/2014 2003 09/27/2014 1552 Full Code [id number]  Waldemar Dickens, MD Inpatient   04/04/2014 1612 04/06/2014 2013 Full Code [id number]  Orson Eva, MD ED   08/18/2013 1356 08/21/2013 1319 Full Code [id number]  Modena Jansky, MD Inpatient   05/07/2013 1908 05/10/2013 1956 Full Code [id number]  Kelvin Cellar, MD Inpatient   04/13/2013 2048 04/18/2013 1921 Full Code [id number]  Orson Eva, MD Inpatient   11/01/2012 1128 11/08/2012 1836 Full Code [id number]  Modena Jansky, MD Inpatient   04/22/2011 1255 04/26/2011 1607 Full Code [id number]  Love, Kaylyn Layer, RN Inpatient    Advance Directive Documentation     Most Recent Value  Type of Advance Directive  Living will  Pre-existing out of facility DNR order (yellow form or pink MOST form)  -  "MOST" Form in Place?  -      Home/SNF/Other Home  Chief Complaint possible sepsis  Level of Care/Admitting Diagnosis ED Disposition    ED Disposition Condition [place]: [place] [[id number]]  Level of Care: Telemetry [5]  Admit to tele based on following criteria: Other see comments  Comments: sepsis  Diagnosis: Pneumonia [227785]  Admitting Physician: Tawni Millers [[id number]]  Attending Physician: Tawni Millers [[id number]]  Estimated length of stay: 3 - 4 days  Certification:: I certify this patient will need inpatient services for at least 2 midnights  PT Class (Do Not Modify): Inpatient [101]  PT Acc Code (Do Not Modify): Private [1]       Medical History Past Medical History:  Diagnosis Date  . Arthritis    r tkr  . Breast cancer ([place])   . Chronic atrial fibrillation    CHADS2-VASc=6.  on low dose Eliquis (corrected for age & renal fxn)  . CVA (cerebral vascular accident) ([place]) 2008   mini stroke.no residual  . DEPRESSION 05/16/2009  . Diabetes mellitus type II   . Heart murmur    stress test 2009.dr peter [place]  . HYPERLIPIDEMIA 07/24/2008  . HYPERTENSION 07/24/2008  . HYPOTHYROIDISM 07/24/2008  . Intermittent vertigo   . Metastatic carcinoid tumor to intra-abdominal site ([place])   . Pneumonia   . Pulmonary hypertension ([place])    Mod PHTN on Echo 03/2016: 2/2 combination of Wegener's, HFPF & Afib.  . Skin abnormality    facial lesions .pt applying mupiracin to areas  . Vertigo   . Wegener's disease, pulmonary ([place]) 10/2012    Allergies Allergies  Allergen Reactions  . Codeine Sulfate Other (See Comments)    REACTION: GI upset  . Sulfonamide Derivatives Other (  See Comments)    REACTION: GI upset  . Vancomycin Other (See Comments)    Red man syndrome  . Atarax [Hydroxyzine] Nausea Only and Rash    IV Location/Drains/Wounds Patient Lines/Drains/Airways Status   Active Line/Drains/Airways    Name:   Placement date:   Placement time:   Site:   Days:   Peripheral IV 03/15/18 Right Forearm   03/15/18    0855    Forearm   less than 1   Peripheral IV 03/15/18 Left Forearm   03/15/18    0904    Forearm   less than 1   External Urinary Catheter   03/15/18    1212    -   less than 1   Incision (Closed) 07/01/16 Abdomen Other (Comment)   07/01/16    1551     622   Wound / Incision (Open or Dehisced) 12/10/15  Diabetic ulcer Foot Right open blister with erythemia   12/10/15    1715    Foot   826          Labs/Imaging Results for orders placed or performed during the hospital encounter of 03/15/18 (from the past 48 hour(s))  Comprehensive metabolic panel     Status: Abnormal   Collection Time: 03/15/18  9:13 AM  Result Value Ref Range   Sodium 132 (L) 135 - 145 mmol/L   Potassium 4.1 3.5 - 5.1 mmol/L   Chloride 99 98 - 111 mmol/L   CO2 23 22 - 32 mmol/L   Glucose, Bld 150 (H) 70 - 99 mg/dL   BUN 36 (H) 8 - 23 mg/dL   Creatinine, Ser 2.04 (H) 0.44 - 1.00 mg/dL   Calcium 8.1 (L) 8.9 - 10.3 mg/dL   Total Protein 6.7 6.5 - 8.1 g/dL   Albumin 3.5 3.5 - 5.0 g/dL   AST 16 15 - 41 U/L   ALT 10 0 - 44 U/L   Alkaline Phosphatase 55 38 - 126 U/L   Total Bilirubin 1.3 (H) 0.3 - 1.2 mg/dL   GFR calc non Af Amer 22 (L) >60 mL/min   GFR calc Af Amer 25 (L) >60 mL/min   Anion gap 10 5 - 15    Comment: Performed at [facility], [place] [address], [place], [place] [address]  I-Stat CG4 Lactic Acid, ED     Status: None   Collection Time: 03/15/18  9:13 AM  Result Value Ref Range   Lactic Acid, Venous 1.04 0.5 - 1.9 mmol/L  CBC with Differential     Status: Abnormal   Collection Time: 03/15/18  9:13 AM  Result Value Ref Range   WBC 8.6 4.0 - 10.5 K/uL   RBC 2.33 (L) 3.87 - 5.11 MIL/uL   Hemoglobin 7.2 (L) 12.0 - 15.0 g/dL   HCT 22.8 (L) 36.0 - 46.0 %   MCV 97.9 80.0 - 100.0 fL   MCH 30.9 26.0 - 34.0 pg   MCHC 31.6 30.0 - 36.0 g/dL   RDW 13.6 11.5 - 15.5 %   Platelets 210 150 - 400 K/uL   nRBC 0.0 0.0 - 0.2 %   Neutrophils Relative % 74 %   Neutro Abs 6.6 1.7 - 7.7 K/uL   Lymphocytes Relative 11 %   Lymphs Abs 0.9 0.7 - 4.0 K/uL   Monocytes Relative 11 %   Monocytes Absolute 0.9 0.1 - 1.0 K/uL   Eosinophils Relative 2 %   Eosinophils Absolute 0.1 0.0 - 0.5 K/uL  Basophils Relative 1 %   Basophils Absolute 0.1 0.0 - 0.1 K/uL   Immature Granulocytes 1 %   Abs Immature  Granulocytes 0.04 0.00 - 0.07 K/uL    Comment: Performed at [facility], [place] [address], [place], [place] [address]  Protime-INR     Status: Abnormal   Collection Time: 03/15/18  9:13 AM  Result Value Ref Range   Prothrombin Time 17.9 (H) 11.4 - 15.2 seconds   INR 1.49     Comment: Performed at [facility], [place] [address] [place], [place] [address]  I-Stat Troponin, ED (not at [facility])     Status: None   Collection Time: 03/15/18  9:13 AM  Result Value Ref Range   Troponin i, poc 0.01 0.00 - 0.08 ng/mL   Comment 3            Comment: Due to the release kinetics of cTnI, a negative result within the first hours of the onset of symptoms does not rule out myocardial infarction with certainty. If myocardial infarction is still suspected, repeat the test at appropriate intervals.   Brain natriuretic peptide     Status: Abnormal   Collection Time: 03/15/18  9:14 AM  Result Value Ref Range   B Natriuretic Peptide 1,123.7 (H) 0.0 - 100.0 pg/mL    Comment: Performed at [facility], [place] [address], [place], [place] [address]   Dg Chest 2 View  Result Date: 03/15/2018 CLINICAL DATA:  Decreased oxygen saturation. History of breast cancer. EXAM: CHEST - 2 VIEW COMPARISON:  Single-view of the chest 08/26/2017. CT chest 09/17/2017. FINDINGS: Dense airspace disease is present in the upper lobes bilaterally most consistent with multifocal pneumonia. There is also some airspace disease in the right middle lobe. Marked cardiomegaly is identified. No pneumothorax or pleural fluid. Aortic atherosclerosis is noted. No acute or focal bony abnormality. IMPRESSION: Dense bilateral upper lobe airspace disease and patchy airspace opacity in the right middle lobe most consistent with pneumonia. Cardiomegaly. Atherosclerosis. Electronically Signed   By: Inge Rise M.D.   On: 03/15/2018 09:52   None  Pending Labs Unresulted Labs (From admission,  onward)    Start     Ordered   03/16/18 7902  Basic metabolic panel  Tomorrow morning,   R     03/15/18 1213   03/16/18 0500  CBC  Tomorrow morning,   R     03/15/18 1213   03/15/18 0907  Culture, blood (Routine x 2)  BLOOD CULTURE X 2,   STAT     03/15/18 0907   03/15/18 0907  Urinalysis, Routine w reflex microscopic  ONCE - STAT,   STAT     03/15/18 0907          Vitals/Pain Today's Vitals   03/15/18 1130 03/15/18 1212 03/15/18 1230 03/15/18 1346  BP: (!) 145/70 (!) 155/78 (!) 150/83   Pulse: 69 81 64   Resp: (!) 24 20 (!) 24   SpO2: 97% 92% 98%   Weight:      Height:      PainSc:  0-No pain  0-No pain    Isolation Precautions No active isolations  Medications Medications  piperacillin-tazobactam (ZOSYN) IVPB 3.375 g (3.375 g Intravenous New Bag/Given (Non-Interop) 03/15/18 1159)  nebivolol (BYSTOLIC) tablet 10 mg (has no administration in time range)  levothyroxine (SYNTHROID, LEVOTHROID) tablet 100 mcg (has no administration in time range)  predniSONE (DELTASONE) tablet 5 mg (has no administration in time range)  apixaban (ELIQUIS) tablet 2.5 mg (2.5 mg Oral Given 03/15/18 1337)  gabapentin (NEURONTIN) capsule 100 mg (100 mg Oral Given 03/15/18 1336)  feeding supplement (ENSURE ENLIVE) (ENSURE ENLIVE) liquid 237 mL (has no administration in time range)  guaiFENesin (MUCINEX) 12 hr tablet 600 mg (has no administration in time range)  acetaminophen (TYLENOL) tablet 650 mg (has no administration in time range)    Or  acetaminophen (TYLENOL) suppository 650 mg (has no administration in time range)  ondansetron (ZOFRAN) tablet 4 mg (has no administration in time range)    Or  ondansetron (ZOFRAN) injection 4 mg (has no administration in time range)  cefTRIAXone (ROCEPHIN) 1 g in sodium chloride 0.9 % 100 mL IVPB (0 g Intravenous Stopped 03/15/18 1135)  sodium chloride 0.9 % bolus 500 mL (0 mLs Intravenous Stopped 03/15/18 1204)    Mobility walks with device

## 2018-03-15 NOTE — ED Notes (Signed)
ED Provider at bedside. 

## 2018-03-15 NOTE — H&P (Signed)
History and Physical    [name]:[id number] DOB: July 19, 1932 DOA: 03/15/2018  PCP: Kristian Covey, MD   Patient coming from: Home    Chief Complaint: Hemoptysis, and generalized weakness.   HPI: [name] [name] is a 84 y.o. female with medical history significant of Wegener's disease, chronic atrial fibrillation, dyslipidemia, hypothyroidism and hypertension.  Patient has been not feeling well for the last 10 days, with postnasal dripping and coughing, occasional hemoptysis.  She was seen by her pulmonologist Dr. Maple Hudson December 5, who follows her for pulmonary Wegener's granulomatosis.  She was advised to continue taking prednisone 5 mg daily.  Her CT chest from June suggested possible progression of her disease.  A CT chest high resolution was requested.  At home patient continued to have postnasal dripping, congestion and dry cough, her symptoms were more severe last night, associated with dyspnea, no improving or worsening factors, no associated chest pain or fevers.  She used nasal sprays with no improvement of her symptoms.  Due to persistent symptoms she came to the hospital for further evaluation.  Patient uses supplemental oxygen nasal cannula at night.   ED Course: Patient had a full work-up performed, her chest x-ray was suggestive of pneumonia, she was referred for admission for evaluation.  Review of Systems:  1. General: No fevers, no chills, no weight gain or weight loss 2. ENT: Positive post nasal dripping and hemoptysis as mentioned in the HPI.  3. Pulmonary: Positive dyspnea and cough, no wheezing.  4. Cardiovascular: No angina, claudication, lower extremity edema, pnd or orthopnea 5. Gastrointestinal: No nausea or vomiting, no diarrhea or constipation 6. Hematology: No easy bruisability or frequent infections 7. Urology: No dysuria, hematuria or increased urinary frequency 8. Dermatology: No rashes. 9. Neurology: No seizures or paresthesias 10. Musculoskeletal:  No joint pain or deformities  Past Medical History:  Diagnosis Date  . Arthritis    r tkr  . Breast cancer (HCC)   . Chronic atrial fibrillation    CHADS2-VASc=6.  on low dose Eliquis (corrected for age & renal fxn)  . CVA (cerebral vascular accident) (HCC) 2008   mini stroke.no residual  . DEPRESSION 05/16/2009  . Diabetes mellitus type II   . Heart murmur    stress test 2009.dr peter [place]  . HYPERLIPIDEMIA 07/24/2008  . HYPERTENSION 07/24/2008  . HYPOTHYROIDISM 07/24/2008  . Intermittent vertigo   . Metastatic carcinoid tumor to intra-abdominal site (HCC)   . Pneumonia   . Pulmonary hypertension (HCC)    Mod PHTN on Echo 03/2016: 2/2 combination of Wegener's, HFPF & Afib.  . Skin abnormality    facial lesions .pt applying mupiracin to areas  . Vertigo   . 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 BREAST SURGERY  2007   Lumpectomy, XRT 2006.l breast  . CHOLECYSTECTOMY  1980  . EXPLORATORY LAPAROTOMY    . HEEL SPUR SURGERY Right   . JOINT REPLACEMENT     r knee  . KNEE SURGERY  2009   TKR  . TEE WITHOUT CARDIOVERSION N/A 09/16/2017   Procedure: TRANSESOPHAGEAL ECHOCARDIOGRAM (TEE);  Surgeon: Laurey Morale, MD;  Location: [facility] ENDOSCOPY;  Service: Cardiovascular;  Laterality: N/A;  . TRANSTHORACIC ECHOCARDIOGRAM  03/2016   Afib (no Diastolic Fxn). EF 55-60%. No RWMA. Mild Aortic dilation Mild MR. Severe LA dilation. Mod RA dilation.  Mod TR with Mod-Severe Pulmonary HTN - but normal RV function..  . VENTRAL HERNIA REPAIR  N/A 07/01/2016   Procedure: REPAIR OF INCARCERATED INCISIONAL HERNIA ;  Surgeon: Almond Lint, MD;  Location: [facility] ORS;  Service: General;  Laterality: N/A;  . VIDEO ASSISTED THORACOSCOPY  04/22/2011   Procedure: VIDEO ASSISTED THORACOSCOPY;  Surgeon: Loreli Slot, MD;  Location[facility] OR;  Service: Thoracic;  Laterality: Left;  WITH BIOPSY     reports that she quit smoking about 35 years ago. Her  smoking use included cigarettes. She has a 6.00 pack-year smoking history. She has never used smokeless tobacco. She reports that she does not drink alcohol or use drugs.  Allergies  Allergen Reactions  . Codeine Sulfate Other (See Comments)    REACTION: GI upset  . Sulfonamide Derivatives Other (See Comments)    REACTION: GI upset  . Vancomycin Other (See Comments)    Red man syndrome  . Atarax [Hydroxyzine] Nausea Only and Rash    Family History  Problem Relation Age of Onset  . Arthritis Mother   . Rheum arthritis Mother   . Heart disease Father   . Coronary artery disease Father   . Cancer Sister        breast CA, both sisters  . Breast cancer Sister   . Coronary artery disease Brother   . Lung cancer Brother      Prior to Admission medications   Medication Sig Start Date End Date Taking? Authorizing Provider  acetaminophen (TYLENOL) 500 MG tablet Take 500 mg by mouth every 6 (six) hours as needed for headache.    [provider]  amLODipine (NORVASC) 5 MG tablet Take 5 mg by mouth daily. 09/25/17   [provider]  BYSTOLIC 10 MG tablet TAKE 1 TABLET BY MOUTH ONCE DAILY AFTER  BREAKFAST 02/22/18   Burchette, Elberta Fortis, MD  diazepam (VALIUM) 5 MG tablet TAKE 1 TABLET BY MOUTH EVERY 12 HOURS AS NEEDED FOR SEVERE VERTIGO FLARES. AVOID REGULAR USE. 11/02/17   Burchette, Elberta Fortis, MD  ELIQUIS 2.5 MG TABS tablet TAKE 1 TABLET BY MOUTH TWICE DAILY 08/26/17   Burchette, Elberta Fortis, MD  feeding supplement, ENSURE ENLIVE, (ENSURE ENLIVE) LIQD Take 237 mLs by mouth 2 (two) times daily between meals. 09/18/17   Calvert Cantor, MD  furosemide (LASIX) 20 MG tablet Take 1 tablet (20 mg total) by mouth daily. 09/04/16   Burchette, Elberta Fortis, MD  gabapentin (NEURONTIN) 100 MG capsule TAKE 1 CAPSULE BY MOUTH TWICE DAILY BEFORE MEAL(S) 10/29/17   Burchette, Elberta Fortis, MD  glimepiride (AMARYL) 2 MG tablet TAKE ONE TABLET BY MOUTH ONCE DAILY BEFORE BREAKFAST 10/07/16   Burchette, Elberta Fortis, MD    guaiFENesin (MUCINEX) 600 MG 12 hr tablet Take 1 tablet (600 mg total) by mouth 2 (two) times daily as needed for cough. 09/17/17   Calvert Cantor, MD  levothyroxine (SYNTHROID, LEVOTHROID) 100 MCG tablet TAKE 1 TABLET BY MOUTH ONCE DAILY BEFORE BREAKFAST. 07/27/17   Burchette, Elberta Fortis, MD  ondansetron (ZOFRAN-ODT) 4 MG disintegrating tablet Take 1 tablet (4 mg total) by mouth every 8 (eight) hours as needed for nausea or vomiting. 09/18/17   Burchette, Elberta Fortis, MD  predniSONE (DELTASONE) 5 MG tablet TAKE 1 TABLET BY MOUTH ONCE DAILY WITH  BREAKFAST 03/11/18   Jetty Duhamel D, MD    Physical Exam: Vitals:   03/15/18 0905 03/15/18 0919 03/15/18 0919 03/15/18 0930  BP:    122/82  Pulse:  (!) 59 77 (!) 53  Resp:  15  18  SpO2:  93%  (!) 87%  Weight: 70.7 kg     Height: 5\' 6"  (1.676 m)       Vitals:   03/15/18 0905 03/15/18 0919 03/15/18 0919 03/15/18 0930  BP:    122/82  Pulse:  (!) 59 77 (!) 53  Resp:  15  18  SpO2:  93%  (!) 87%  Weight: 70.7 kg     Height: 5\' 6"  (1.676 m)      General: deconditioned  Neurology: Awake and alert, non focal Head and Neck. Head normocephalic. Neck supple with no adenopathy or thyromegaly.   E ENT: mild pallor, no icterus, oral mucosa moist Cardiovascular: No JVD. S1-S2 present, irregularly irregular, no gallops, rubs, or murmurs. No lower extremity edema. Pulmonary: positive breath sounds bilaterally, decreased air movement, no wheezing, or rhonchi but scattered rales. Gastrointestinal. Abdomen with no organomegaly, non tender, no rebound or guarding Skin. No rashes Musculoskeletal: no joint deformities    Labs on Admission: I have personally reviewed following labs and imaging studies  CBC: Recent Labs  Lab 03/15/18 0913  WBC 8.6  NEUTROABS 6.6  HGB 7.2*  HCT 22.8*  MCV 97.9  PLT 210   Basic Metabolic Panel: Recent Labs  Lab 03/15/18 0913  NA 132*  K 4.1  CL 99  CO2 23  GLUCOSE 150*  BUN 36*  CREATININE 2.04*  CALCIUM 8.1*    GFR: Estimated Creatinine Clearance: 19.6 mL/min (A) (by C-G formula based on SCr of 2.04 mg/dL (H)). Liver Function Tests: Recent Labs  Lab 03/15/18 0913  AST 16  ALT 10  ALKPHOS 55  BILITOT 1.3*  PROT 6.7  ALBUMIN 3.5   No results for input(s): LIPASE, AMYLASE in the last 168 hours. No results for input(s): AMMONIA in the last 168 hours. Coagulation Profile: Recent Labs  Lab 03/15/18 0913  INR 1.49   Cardiac Enzymes: No results for input(s): CKTOTAL, CKMB, CKMBINDEX, TROPONINI in the last 168 hours. BNP (last 3 results) No results for input(s): PROBNP in the last 8760 hours. HbA1C: No results for input(s): HGBA1C in the last 72 hours. CBG: No results for input(s): GLUCAP in the last 168 hours. Lipid Profile: No results for input(s): CHOL, HDL, LDLCALC, TRIG, CHOLHDL, LDLDIRECT in the last 72 hours. Thyroid Function Tests: No results for input(s): TSH, T4TOTAL, FREET4, T3FREE, THYROIDAB in the last 72 hours. Anemia Panel: No results for input(s): VITAMINB12, FOLATE, FERRITIN, TIBC, IRON, RETICCTPCT in the last 72 hours. Urine analysis:    Component Value Date/Time   COLORURINE YELLOW 09/29/2016 0930   APPEARANCEUR CLEAR 09/29/2016 0930   LABSPEC 1.014 09/29/2016 0930   PHURINE 6.0 09/29/2016 0930   GLUCOSEU NEGATIVE 09/29/2016 0930   HGBUR MODERATE (A) 09/29/2016 0930   BILIRUBINUR NEGATIVE 09/29/2016 0930   BILIRUBINUR n 12/30/2010 1700   KETONESUR 5 (A) 09/29/2016 0930   PROTEINUR 100 (A) 09/29/2016 0930   UROBILINOGEN 1.0 09/25/2014 1550   NITRITE NEGATIVE 09/29/2016 0930   LEUKOCYTESUR LARGE (A) 09/29/2016 0930    Radiological Exams on Admission: Dg Chest 2 View  Result Date: 03/15/2018 CLINICAL DATA:  Decreased oxygen saturation. History of breast cancer. EXAM: CHEST - 2 VIEW COMPARISON:  Single-view of the chest 08/26/2017. CT chest 09/17/2017. FINDINGS: Dense airspace disease is present in the upper lobes bilaterally most consistent with multifocal  pneumonia. There is also some airspace disease in the right middle lobe. Marked cardiomegaly is identified. No pneumothorax or pleural fluid. Aortic atherosclerosis is noted. No acute or focal bony abnormality. IMPRESSION: Dense bilateral upper lobe airspace disease  and patchy airspace opacity in the right middle lobe most consistent with pneumonia. Cardiomegaly. Atherosclerosis. Electronically Signed   By: Drusilla Kanner M.D.   On: 03/15/2018 09:52    EKG: Independently reviewed.  Atrial fibrillation, normal axis.  Assessment/Plan Active Problems:   Pneumonia   84 year old female with history of Wegener's granulomatosis who presents with generalized weakness, postnasal dripping and hemoptysis.  Apparently her weakness has been progressive per CT imaging in June 2019, currently on prednisone.  For the last 10 days she has not been feeling well, positive postnasal dripping, cough and hemoptysis, worsening symptoms last night.  On her initial physical examination blood pressure 150/74, heart rate 65, temperature 98, oxygen saturation 96% on 4 L per nasal cannula.  Moist mucous membranes, decreased air movement, scattered rails, heart S1-S2 present and irregularly irregular, abdomen soft, no lower extremity edema.  Sodium 132, potassium 4.1, chloride 99, bicarb 23, glucose 150, BUN 36, creatinine 2.0, BNP 1123, white count 8.6, hemoglobin 7.2, hematocrit 32.8, platelets 210.  Radiograph with bilateral patchy infiltrates, right upper lobe, right lower lobe and left lower lobe.  Positive cardiomegaly no pleural effusions.   Patient will be admitted to the hospital with working diagnosis of acute hypoxic respiratory failure due to multilobar pneumonia, rule out recurrent interstitial lung disease, Wegener's granulomatosis.   1.  Acute hypoxic respiratory failure, due to multilobar pneumonia, (community-acquired), rule out recurrent interstitial lung disease.  Patient's clinical presentation more suggestive  of recurrence of interstitial lung disease, does have history of Wegener's granulomatosis and she reported postnasal dripping, nasal congestion and hemoptysis.  She does have acute anemia with her hemoglobin is down to 7.2, bilateral pulmonary infiltrates.  Will order high-resolution CT scan, possible alveolar hemorrhages.  Will start patient on empiric antibiotic therapy with ceftriaxone, azithromycin, systemic steroids, oximetry monitoring, and supplemental oxygen per nasal cannula.    2.  Hypertension.  Continue blood pressure control with amlodipine.  3.  Diastolic heart failure.  No signs of acute exacerbation, continue Bystolic, and furosemide to keep negative fluid balance.  4.  Hypothyroidism.  Continue levothyroxine.  5.  Type 2 diabetes mellitus.  Will hold on oral hypoglycemic agents, continue insulin sliding scale for glucose coverage and monitoring.  6.  Chronic atrial fibrillation.  Continue rate control with Bystolic, hold on apixaban, due to acute anemia.  DVT prophylaxis: scd  Code Status: full  Family Communication: I spoke with patient's family at the bedside and all questions were addressed.    Disposition Plan: telemetry   Consults called: pulmonary   Admission status: inpatient.     Netra Postlethwait Annett Gula MD [facility] Pager [phone]  If 7PM-7AM, please contact night-coverage [email or web address] Password [email or web address]  03/15/2018, 10:30 AM

## 2018-03-15 NOTE — ED Notes (Signed)
Patient transported to X-ray 

## 2018-03-15 NOTE — ED Notes (Signed)
EDP [facility] REQUESTED PT TO BE PLACED ON 3LNC. 02 SATS 88-89%. PT INCREASED TO 4LNC. WILL REASSESS.

## 2018-03-15 NOTE — ED Notes (Signed)
BLOOD CULTURES X 2 OBTAINED BY Carrington Clamp RN

## 2018-03-15 NOTE — ED Provider Notes (Signed)
[facility] DEPT Provider Note   CSN: [id number] Arrival date & time: 03/15/18  0841     History   Chief Complaint Chief Complaint  Patient presents with  . Fever  . Cough  . Weakness    HPI [name] [name] is a 84 y.o. female.  Patient is an 84 year old female with past medical history of atrial fibrillation, diabetes, pulmonary hypertension.  She presents today for evaluation of weakness, cough, and shortness of breath.  This is been ongoing for the past several days.  She was seen by her primary doctor last week, however no change was made in her medications.  She states that her cough has been intermittently productive, at times of blood-tinged sputum.  She denies any leg swelling or pain.  She denies any fevers, chills, or sick contacts.  The history is provided by the patient.  Weakness  Primary symptoms comment: Generalized weakness. This is a new problem. Episode onset: 3 days ago. The problem has been rapidly worsening. There has been no fever. Associated symptoms include shortness of breath. Pertinent negatives include no chest pain.    Past Medical History:  Diagnosis Date  . Arthritis    r tkr  . Breast cancer ([place])   . Chronic atrial fibrillation    CHADS2-VASc=6.  on low dose Eliquis (corrected for age & renal fxn)  . CVA (cerebral vascular accident) ([place]) 2008   mini stroke.no residual  . DEPRESSION 05/16/2009  . Diabetes mellitus type II   . Heart murmur    stress test 2009.dr peter [place]  . HYPERLIPIDEMIA 07/24/2008  . HYPERTENSION 07/24/2008  . HYPOTHYROIDISM 07/24/2008  . Intermittent vertigo   . Metastatic carcinoid tumor to intra-abdominal site ([place])   . Pneumonia   . Pulmonary hypertension ([place])    Mod PHTN on Echo 03/2016: 2/2 combination of Wegener's, HFPF & Afib.  . Skin abnormality    facial lesions .pt applying mupiracin to areas  . Vertigo   . Wegener's disease, pulmonary ([place]) 10/2012    Patient Active Problem  List   Diagnosis Date Noted  . Community acquired bilateral lower lobe pneumonia ([place]) 09/17/2017  . [facility] 09/16/2017  . Acute on chronic respiratory failure with hypoxia ([place]) 09/29/2016  . Malnutrition of moderate degree 07/01/2016  . Incarcerated hernia 06/30/2016  . Recurrent umbilical hernia with incarceration 06/30/2016  . Cellulitis and abscess   . Diabetic foot ulcer ([place]) 12/10/2015  . Hereditary and idiopathic peripheral neuropathy 09/25/2014  . Dependent edema 09/25/2014  . UTI (lower urinary tract infection)   . Type 2 diabetes mellitus with neurological complications ([place]) 57/26/2035  . Chronic atrial fibrillation (HCC) CHADS2-VASc=6.  On Corrected "low" dose of Eliquis 04/04/2014  . CKD (chronic kidney disease) stage 3, GFR 30-59 ml/min (HCC) 04/04/2014  . Abdominal hernia without obstruction or gangrene 01/09/2014  . Nausea alone 08/21/2013  . Diastolic CHF, chronic (HCC) - on low dose Lasix. ARB & BB along with Norvasc 08/18/2013  . Sepsis ([place]) 08/18/2013  . Hand pain, left 08/18/2013  . Chronic kidney disease (CKD), stage IV (severe) ([place]) 05/08/2013  . Normocytic anemia 05/08/2013  . Pulmonary hypertension ([place]) 05/07/2013  . Right-sided heart failure ([place]) 05/07/2013  . Pancreatic cyst 04/17/2013  . Obesity (BMI 30-39.9) 12/28/2012  . Wegener's granulomatosis (granulomatosis with polyangiitis) ([place]) 11/02/2012  . Cough with hemoptysis 11/01/2012  . Anemia 11/01/2012  . Dehydration 11/01/2012  . Diffuse pulmonary alveolar hemorrhage 11/01/2012  . Breast cancer ([place]) 09/14/2012  .  Pruritus of skin 05/20/2012  . Carcinoid tumor of colon, malignant ([place]) 05/16/2011  . Cryptogenic organizing pneumonia 04/05/2011  . Vertigo, intermittent 10/21/2010  . Edema 03/25/2010  . Hyponatremia 02/13/2010  . DEPRESSION 05/16/2009  . Hypothyroidism 07/24/2008  . Hyperlipidemia 07/24/2008  . Essential hypertension 07/24/2008  . OSTEOARTHRITIS  07/24/2008    Past Surgical History:  Procedure Laterality Date  . BREAST SURGERY  2007   Lumpectomy, XRT 2006.l breast  . CHOLECYSTECTOMY  1980  . EXPLORATORY LAPAROTOMY    . HEEL SPUR SURGERY Right   . JOINT REPLACEMENT     r knee  . KNEE SURGERY  2009   TKR  . TEE WITHOUT CARDIOVERSION N/A 09/16/2017   Procedure: TRANSESOPHAGEAL ECHOCARDIOGRAM (TEE);  Surgeon: Larey Dresser, MD;  Location: [facility] ENDOSCOPY;  Service: Cardiovascular;  Laterality: N/A;  . TRANSTHORACIC ECHOCARDIOGRAM  03/2016   Afib (no Diastolic Fxn). EF 55-60%. No RWMA. Mild Aortic dilation Mild MR. Severe LA dilation. Mod RA dilation.  Mod TR with Mod-Severe Pulmonary HTN - but normal RV function..  . VENTRAL HERNIA REPAIR N/A 07/01/2016   Procedure: REPAIR OF INCARCERATED INCISIONAL HERNIA ;  Surgeon: Stark Klein, MD;  Location: [facility] ORS;  Service: General;  Laterality: N/A;  . VIDEO ASSISTED THORACOSCOPY  04/22/2011   Procedure: VIDEO ASSISTED THORACOSCOPY;  Surgeon: Melrose Nakayama, MD;  Location: [facility];  Service: Thoracic;  Laterality: Left;  WITH BIOPSY     OB History   None      Home Medications    Prior to Admission medications   Medication Sig Start Date End Date Taking? Authorizing Provider  acetaminophen (TYLENOL) 500 MG tablet Take 500 mg by mouth every 6 (six) hours as needed for headache.    [provider]  amLODipine (NORVASC) 5 MG tablet Take 5 mg by mouth daily. 09/25/17   [provider]  BYSTOLIC 10 MG tablet TAKE 1 TABLET BY MOUTH ONCE DAILY AFTER  BREAKFAST 02/22/18   Burchette, Alinda Sierras, MD  diazepam (VALIUM) 5 MG tablet TAKE 1 TABLET BY MOUTH EVERY 12 HOURS AS NEEDED FOR SEVERE VERTIGO FLARES. AVOID REGULAR USE. 11/02/17   Burchette, Alinda Sierras, MD  ELIQUIS 2.5 MG TABS tablet TAKE 1 TABLET BY MOUTH TWICE DAILY 08/26/17   Burchette, Alinda Sierras, MD  feeding supplement, ENSURE ENLIVE, (ENSURE ENLIVE) LIQD Take 237 mLs by mouth 2 (two) times daily between meals. 09/18/17   Debbe Odea, MD  furosemide (LASIX) 20 MG tablet Take 1 tablet (20 mg total) by mouth daily. 09/04/16   Burchette, Alinda Sierras, MD  gabapentin (NEURONTIN) 100 MG capsule TAKE 1 CAPSULE BY MOUTH TWICE DAILY BEFORE MEAL(S) 10/29/17   Burchette, Alinda Sierras, MD  glimepiride (AMARYL) 2 MG tablet TAKE ONE TABLET BY MOUTH ONCE DAILY BEFORE BREAKFAST 10/07/16   Burchette, Alinda Sierras, MD  guaiFENesin (MUCINEX) 600 MG 12 hr tablet Take 1 tablet (600 mg total) by mouth 2 (two) times daily as needed for cough. 09/17/17   Debbe Odea, MD  levothyroxine (SYNTHROID, LEVOTHROID) 100 MCG tablet TAKE 1 TABLET BY MOUTH ONCE DAILY BEFORE BREAKFAST. 07/27/17   Burchette, Alinda Sierras, MD  ondansetron (ZOFRAN-ODT) 4 MG disintegrating tablet Take 1 tablet (4 mg total) by mouth every 8 (eight) hours as needed for nausea or vomiting. 09/18/17   Burchette, Alinda Sierras, MD  predniSONE (DELTASONE) 5 MG tablet TAKE 1 TABLET BY MOUTH ONCE DAILY WITH  BREAKFAST 03/11/18   Deneise Lever, MD    Family History Family History  Problem Relation Age of Onset  . Arthritis Mother   . Rheum arthritis Mother   . Heart disease Father   . Coronary artery disease Father   . Cancer Sister        breast CA, both sisters  . Breast cancer Sister   . Coronary artery disease Brother   . Lung cancer Brother     Social History Social History   Tobacco Use  . Smoking status: Former Smoker    Packs/day: 0.50    Years: 12.00    Pack years: 6.00    Types: Cigarettes    Last attempt to quit: 06/04/1982    Years since quitting: 35.8  . Smokeless tobacco: Never Used  Substance Use Topics  . Alcohol use: No  . Drug use: No     Allergies   Codeine sulfate; Sulfonamide derivatives; Vancomycin; and Atarax [hydroxyzine]   Review of Systems Review of Systems  Respiratory: Positive for shortness of breath.   Cardiovascular: Negative for chest pain.  Neurological: Positive for weakness.  All other systems reviewed and are negative.    Physical  Exam Updated Vital Signs BP 132/68 (BP Location: Right Arm) Comment: Simultaneous filing. User may not have seen previous data.  Pulse 77   Resp (!) 23   Ht _0  (1.676 m)   Wt 70.7 kg   SpO2 100%   BMI 25.16 kg/m   Physical Exam  Constitutional: She is oriented to person, place, and time. She appears well-developed and well-nourished. No distress.  HENT:  Head: Normocephalic and atraumatic.  Neck: Normal range of motion. Neck supple.  Cardiovascular: Exam reveals no gallop and no friction rub.  No murmur heard. Heart is irregularly irregular.  Pulmonary/Chest: Effort normal. No respiratory distress. She has no wheezes. She has rales.  There are rales in the bases bilaterally, left greater than right.  Abdominal: Soft. Bowel sounds are normal. She exhibits no distension. There is no tenderness.  Musculoskeletal: Normal range of motion. She exhibits no edema.  There is no extremity edema, calf tenderness, or erythema.  Neurological: She is alert and oriented to person, place, and time.  Skin: Skin is warm and dry. She is not diaphoretic.  Nursing note and vitals reviewed.    ED Treatments / Results  Labs (all labs ordered are listed, but only abnormal results are displayed) Labs Reviewed  CULTURE, BLOOD (ROUTINE X 2)  CULTURE, BLOOD (ROUTINE X 2)  COMPREHENSIVE METABOLIC PANEL  CBC WITH DIFFERENTIAL/PLATELET  PROTIME-INR  URINALYSIS, ROUTINE W REFLEX MICROSCOPIC  BRAIN NATRIURETIC PEPTIDE  I-STAT CG4 LACTIC ACID, ED  I-STAT TROPONIN, ED    EKG None  Radiology No results found.  Procedures Procedures (including critical care time)  Medications Ordered in ED Medications - No data to display   Initial Impression / Assessment and Plan / ED Course  I have reviewed the triage vital signs and the nursing notes.  Pertinent labs & imaging results that were available during my care of the patient were reviewed by me and considered in my medical decision making (see  chart for details).  Patient presents with weakness, cough, and shortness of breath.  She arrived here hypoxic with oxygen saturations in the upper 70s.  Her work-up reveals bilateral airspace disease consistent with pneumonia.  She was given appropriate antibiotics for community-acquired pneumonia and will be admitted to the hospitalist service for further treatment.  Final Clinical Impressions(s) / ED Diagnoses   Final diagnoses:  None    ED Discharge  Orders    None       Veryl Speak, MD 03/16/18 [phone]

## 2018-03-15 NOTE — Procedures (Signed)
Intubation Procedure Note [name] [name] [id number] [birth year]  Procedure: Intubation Indications: Respiratory insufficiency  Procedure Details Consent: Risks of procedure as well as the alternatives and risks of each were explained to the (patient/caregiver).  Consent for procedure obtained. Time Out: Verified patient identification, verified procedure, site/side was marked, verified correct patient position, special equipment/implants available, medications/allergies/relevent history reviewed, required imaging and test results available.  Performed  Drugs Etomidate 73m IV, Versed 236mIV, Fentanyl 10031mIV, Rocuronium 80m69m DL x 1 with GS 3 blade Grade 3 view 7.5 ET tube passed through cords under direct visualization Placement confirmed with bilateral breath sounds, positive EtCO2 change and smoke in tube   Evaluation Hemodynamic Status: BP stable throughout; O2 sats: stable throughout Patient's Current Condition: stable Complications: No apparent complications Patient did tolerate procedure well. Chest X-ray ordered to verify placement.  CXR: pending.   DougSimonne Maffucci9/2019

## 2018-03-15 NOTE — Progress Notes (Signed)
[facility]M  Worsening respiratory distress I met with the patient and her family and explained the situation, I noted that her condition is worsening and if we do not intervene with mechanical ventilation she will die in the next few hours.  Considering her prior extensive history with this I asked whether or not she wanted short term intubation.  She is hopeful that her condition will improve with steorids and possibly rituximab so she is willing to try mechanical ventilation.  Roselie Awkward, MD [place] [facility] Pager: [phone] Cell: [phone] If no response, call [phone]

## 2018-03-16 ENCOUNTER — Inpatient Hospital Stay (HOSPITAL_COMMUNITY): Payer: Medicare Other

## 2018-03-16 ENCOUNTER — Encounter (HOSPITAL_COMMUNITY): Payer: Self-pay | Admitting: Nephrology

## 2018-03-16 LAB — BASIC METABOLIC PANEL
Anion gap: 14 (ref 5–15)
BUN: 37 mg/dL — ABNORMAL HIGH (ref 8–23)
CO2: 21 mmol/L — ABNORMAL LOW (ref 22–32)
Calcium: 8.3 mg/dL — ABNORMAL LOW (ref 8.9–10.3)
Chloride: 99 mmol/L (ref 98–111)
Creatinine, Ser: 1.97 mg/dL — ABNORMAL HIGH (ref 0.44–1.00)
GFR calc Af Amer: 27 mL/min — ABNORMAL LOW (ref >60)
GFR calc non Af Amer: 23 mL/min — ABNORMAL LOW (ref >60)
Glucose, Bld: 203 mg/dL — ABNORMAL HIGH (ref 70–99)
Potassium: 4.7 mmol/L (ref 3.5–5.1)
SODIUM: 134 mmol/L — AB (ref 135–145)

## 2018-03-16 LAB — CBC
HCT: 25.3 % — ABNORMAL LOW (ref 36.0–46.0)
Hemoglobin: 7.8 g/dL — ABNORMAL LOW (ref 12.0–15.0)
MCH: 31.2 pg (ref 26.0–34.0)
MCHC: 30.8 g/dL (ref 30.0–36.0)
MCV: 101.2 fL — ABNORMAL HIGH (ref 80.0–100.0)
Platelets: 197 10*3/uL (ref 150–400)
RBC: 2.5 MIL/uL — ABNORMAL LOW (ref 3.87–5.11)
RDW: 13.3 % (ref 11.5–15.5)
WBC: 13.1 10*3/uL — ABNORMAL HIGH (ref 4.0–10.5)
nRBC: 0 % (ref 0.0–0.2)

## 2018-03-16 LAB — MPO/PR-3 (ANCA) ANTIBODIES
ANCA Proteinase 3: >100 U/mL — ABNORMAL HIGH (ref 0.0–3.5)
Myeloperoxidase Abs: <9 U/mL (ref 0.0–9.0)

## 2018-03-16 LAB — STREP PNEUMONIAE URINARY ANTIGEN: Strep Pneumo Urinary Antigen: NEGATIVE

## 2018-03-16 LAB — GLUCOSE, CAPILLARY
Glucose-Capillary: 243 mg/dL — ABNORMAL HIGH (ref 70–99)
Glucose-Capillary: 257 mg/dL — ABNORMAL HIGH (ref 70–99)

## 2018-03-16 MED ORDER — FENTANYL CITRATE (PF) 100 MCG/2ML IJ SOLN
100.0000 ug | Freq: Once | INTRAMUSCULAR | Status: AC
  Administered 2018-03-16: 100 ug via INTRAVENOUS

## 2018-03-16 MED ORDER — LEVOTHYROXINE SODIUM 100 MCG PO TABS
100.0000 ug | ORAL_TABLET | Freq: Every day | ORAL | Status: DC
  Administered 2018-03-17 – 2018-03-18 (×2): 100 ug
  Filled 2018-03-16 (×2): qty 1

## 2018-03-16 MED ORDER — MIDAZOLAM HCL 2 MG/2ML IJ SOLN: 2.0000 mg | Freq: Once | INTRAMUSCULAR | Status: DC

## 2018-03-16 MED ORDER — FUROSEMIDE 20 MG PO TABS
20.0000 mg | ORAL_TABLET | Freq: Every day | ORAL | Status: DC
  Administered 2018-03-17: 20 mg
  Filled 2018-03-16: qty 1

## 2018-03-16 MED ORDER — PRO-STAT SUGAR FREE PO LIQD
30.0000 mL | Freq: Every day | ORAL | Status: DC
  Administered 2018-03-17: 30 mL
  Filled 2018-03-16: qty 30

## 2018-03-16 MED ORDER — RANITIDINE HCL 150 MG/10ML PO SYRP
150.0000 mg | ORAL_SOLUTION | Freq: Two times a day (BID) | ORAL | Status: DC
  Administered 2018-03-16 – 2018-03-17 (×3): 150 mg
  Filled 2018-03-16 (×6): qty 10

## 2018-03-16 MED ORDER — VITAL HIGH PROTEIN PO LIQD: 1000.0000 mL | ORAL | Status: DC

## 2018-03-16 MED ORDER — AZITHROMYCIN 250 MG PO TABS
500.0000 mg | ORAL_TABLET | Freq: Every day | ORAL | Status: DC
  Administered 2018-03-17: 500 mg
  Filled 2018-03-16: qty 2

## 2018-03-16 MED ORDER — FENTANYL CITRATE (PF) 100 MCG/2ML IJ SOLN
INTRAMUSCULAR | Status: AC
  Filled 2018-03-16: qty 2

## 2018-03-16 MED ORDER — GABAPENTIN 250 MG/5ML PO SOLN
100.0000 mg | Freq: Two times a day (BID) | ORAL | Status: DC
  Administered 2018-03-16 – 2018-03-17 (×3): 100 mg
  Filled 2018-03-16 (×4): qty 2

## 2018-03-16 MED ORDER — FAMOTIDINE 40 MG/5ML PO SUSR: 20.0000 mg | Freq: Two times a day (BID) | ORAL | Status: DC

## 2018-03-16 MED ORDER — PRO-STAT SUGAR FREE PO LIQD
30.0000 mL | Freq: Two times a day (BID) | ORAL | Status: DC
  Administered 2018-03-16: 30 mL
  Filled 2018-03-16: qty 30

## 2018-03-16 MED ORDER — NEBIVOLOL HCL 10 MG PO TABS
10.0000 mg | ORAL_TABLET | Freq: Every day | ORAL | Status: DC
  Administered 2018-03-17: 10 mg
  Filled 2018-03-16 (×2): qty 1

## 2018-03-16 MED ORDER — INSULIN ASPART 100 UNIT/ML ~~LOC~~ SOLN
0.0000 [IU] | SUBCUTANEOUS | Status: DC
  Administered 2018-03-16: 5 [IU] via SUBCUTANEOUS
  Administered 2018-03-17: 15 [IU] via SUBCUTANEOUS
  Administered 2018-03-17 (×2): 8 [IU] via SUBCUTANEOUS
  Administered 2018-03-17: 3 [IU] via SUBCUTANEOUS
  Administered 2018-03-17 (×2): 5 [IU] via SUBCUTANEOUS
  Administered 2018-03-17: 3 [IU] via SUBCUTANEOUS
  Administered 2018-03-18 (×3): 5 [IU] via SUBCUTANEOUS
  Administered 2018-03-18: 8 [IU] via SUBCUTANEOUS
  Administered 2018-03-18 – 2018-03-19 (×3): 5 [IU] via SUBCUTANEOUS
  Administered 2018-03-19: 15 [IU] via SUBCUTANEOUS
  Administered 2018-03-19: 3 [IU] via SUBCUTANEOUS
  Administered 2018-03-19 (×2): 8 [IU] via SUBCUTANEOUS
  Administered 2018-03-20 (×2): 3 [IU] via SUBCUTANEOUS

## 2018-03-16 MED ORDER — MIDAZOLAM HCL 2 MG/2ML IJ SOLN
4.0000 mg | Freq: Once | INTRAMUSCULAR | Status: AC
  Administered 2018-03-16: 4 mg via INTRAVENOUS

## 2018-03-16 MED ORDER — MIDAZOLAM HCL 2 MG/2ML IJ SOLN
INTRAMUSCULAR | Status: AC
  Filled 2018-03-16: qty 4

## 2018-03-16 MED ORDER — OSMOLITE 1.2 CAL PO LIQD
1000.0000 mL | ORAL | Status: DC
  Administered 2018-03-16: 1000 mL

## 2018-03-16 NOTE — Progress Notes (Signed)
Collected sputum sample per MD order- given to RN and RN will attach paperwork and send to lab.

## 2018-03-16 NOTE — Progress Notes (Signed)
Initiation of tube feed held per Noe Gens NP until after bronch with Dr [facility]

## 2018-03-16 NOTE — Progress Notes (Signed)
PCCM Video Bronchoscopy Procedure Note  The patient was informed of the risks (including but not limited to bleeding, infection, respiratory failure, lung injury, tooth/oral injury) and benefits of the procedure and gave consent, see chart.  Indication: Hemoptysis  Post Procedure Diagnosis: same, no lung mass  [facility]  Condition pre procedure: critically ill, on vent  Medications for procedure: versed 50m, Fentanyl 1090m IV, precedex infusion  Procedure description: The bronchoscope was introduced through the endotracheal tube and passed to the bilateral lungs to the level of the subsegmental bronchi throughout the tracheobronchial tree.  Airway exam revealed diffuse bloody secretions but no active bleeding, no airway mass.  No focal area of bleeding.    Procedures performed: BAL anterior segment left upper lobe  Specimens sent: BAL culture  Condition post procedure: critically ill, on vent  EBL: none from procedure  Complications: none  BrRoselie AwkwardMD [place] BeachCCM Pager: [phone]: [phone] no response, call [phone]

## 2018-03-16 NOTE — Progress Notes (Signed)
eLink Physician-Brief Progress Note Patient Name: [name] [name] DOB: [birth year] MRN: [id number]   Date of Service  03/16/2018  HPI/Events of Note  Pt started on tube feeds.  FS >200.  eICU Interventions  Continue to monitor FS q4.  Start on insulin sliding scale q4hrs.     Intervention Category Intermediate Interventions: Hyperglycemia - evaluation and treatment  Elsie Lincoln 03/16/2018, 9:37 PM

## 2018-03-16 NOTE — Progress Notes (Signed)
Replaced ETT holder- uneventful. ETT resides at 24 lip, BBS, receiving full VT from vent, Sp02 100%.

## 2018-03-16 NOTE — Progress Notes (Addendum)
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 Annamaria Boots who presented with shortness of breath and hemoptysis.  History of present illness   84 year old female with a past medical history of small vessel vasculitis, carcinoid tumor of the lung, breast cancer who has been treated with chronic prednisone for at least 6 years presented to our facility with increasing fatigue and shortness of breath.  She also noticed the sudden onset of hemoptysis about 1 day prior to admission.  She tells me that she denies any fever, chills, headache, body aches, arthralgias, rash, or trouble swallowing.  She denies any sick contacts.  She says that the symptoms started over the last 3 to 4 days.  She last saw my partner on March 11, 2018 and she said that she felt fine that day.  However not long after that her symptoms started.  She has been coughing up blood since.  She denies current epistaxis.  However, she says about 2 weeks ago she had a severe headache and an episode of epistaxis that lasted for several minutes but then resolved and has not recurred since.  She was admitted by the hospitalist service for further evaluation of worsening shortness of breath, abnormal chest imaging.  Past Medical History  Small vessel vasculitis, proteinase 3 antibody positive, pulmonary hypertension, carcinoid tumor, diabetes mellitus type 2, stroke, A. fib,  Significant Hospital Events   12/09  Admit with SOB, acute onset hemoptysis > intubated late pm 12/10  Remains on vent, PEEP5 / FiO2 30%  Consults:  12/9 PCCM  Procedures:    Significant Diagnostic Tests:  12/9 CXR >> consolidation in the  left upper lobe as well as the right upper lobe, diffuse bilateral airspace disease  HRCT 12/9 >> appearance of lungs most compatible with severe multilobar bronchopneumonia, diffuse alveolar hemorrhage could also be considered, cardiomegaly with biatrial dilatation, tracheobronchomalacia, 2.2cm low-attenuation lesion in the tail of the pancreas similar dating back to 2015, hepatic steatosis ANCA 12/9 >>   Micro Data:  BCx2 12/9 >>  U. Strep 12/10 >>  UA 12/9 >> moderate Hgb, 30 protein, 21-50 RBC  Antimicrobials:  Ceftriaxone 12/9 >> Azithromycin 12/9 >>  Interim history/subjective:  Intubated overnight for respiratory distress. Tmax 101.8.  I/O - neg 644m in last 24 hours. Son at bedside.  Pt remains on precedex 0.2 mcg   Objective   Blood pressure (!) 152/72, pulse 70, temperature 98.8 F (37.1 C), resp. rate (!) 23, height _0  (1.676 m), weight 68.6 kg, SpO2 99 %.    Vent Mode: PRVC FiO2 (%):  [30 %-100 %] 30 % Set Rate:  [22 bmp] 22 bmp Vt Set:  [470 mL] 470 mL PEEP:  [5 cmH20] 5 cmH20 Plateau Pressure:  [22 cmH20-24 cmH20] 22 cmH20   Intake/Output Summary (Last 24 hours) at 03/16/2018 0754 Last data filed at 03/16/2018 0548 Gross per 24 hour  Intake 1161.56 ml  Output 1825 ml  Net -663.44 ml   Filed Weights   03/15/18 0905 03/15/18 1500  Weight: 70.7 kg 68.6 kg    Examination: General: elderly female lying in bed on vent in NAD HEENT: MM pink/dry, ETT Neuro: awake, alert, follows  commands, writes messages / appropriate  CV: s1s2 irr irr, no m/r/g,  PULM: even/non-labored, lungs bilaterally coarse, scattered rhonchi GI: soft, non-tender, bsx4 active  Extremities: warm/dry, no edema  Skin: no rashes or lesions  Resolved Hospital Problem list     Assessment & Plan:   Acute respiratory failure with hypoxemia: -empiric Rx for CAP but concerning for possible small vessel vasculitis P: PRVC 8cc/kg  Wean PEEP/FiO2 for sats >90% Follow CXR  Continue  solu-medrol 28m IV Q6  Plan for FOB for airway inspection Send tracheal aspirate for culture Continue abx as above  Consider rituximab (needed for flare of disease in 2014) Await ANCA Send urine strep antigen  Lasix 20 mg PT QD  Afib P: Hold home eliquis given hemoptysis  Tele monitoring  Hemoptysis P: Empiric Rx for pneumonia and small vessel vasculitis   Acute on chronic kidney failure: worrisome for small vessel vasculitis -UA with moderate Hgb, 30 protein, 21-50 RBC P: Trend BMP / urinary output Replace electrolytes as indicated Avoid nephrotoxic agents, ensure adequate renal perfusion  At Risk Malnutrition  P: Begin TF per Nutrition   Hypothyroidism  P: Continue synthroid   History of carcinoid tumor P: CT as above   History of breast cancer P: CT as above  Best practice:  Diet: NPO / TF  Pain/Anxiety/Delirium protocol (if indicated): in place  VAP protocol (if indicated): ordered  DVT prophylaxis: SCD GI prophylaxis: pepcid PT Glucose control:  Mobility: bed rest  Code Status: full code Family Communication: Son updated at bedside am 12/10 Disposition: ICU  Labs   CBC: Recent Labs  Lab 03/15/18 0913 03/16/18 0241  WBC 8.6 13.1*  NEUTROABS 6.6  --   HGB 7.2* 7.8*  HCT 22.8* 25.3*  MCV 97.9 101.2*  PLT 210 1536   Basic Metabolic Panel: Recent Labs  Lab 03/15/18 0913 03/16/18 0241  NA 132* 134*  K 4.1 4.7  CL 99 99  CO2 23 21*  GLUCOSE 150* 203*  BUN 36* 37*  CREATININE 2.04* 1.97*  CALCIUM 8.1* 8.3*   GFR: Estimated Creatinine Clearance: 20.3 mL/min (A) (by C-G formula based on SCr of 1.97 mg/dL (H)). Recent Labs  Lab 03/15/18 0913 03/16/18 0241  WBC 8.6 13.1*  LATICACIDVEN 1.04  --     Liver Function Tests: Recent Labs  Lab 03/15/18 0913  AST 16  ALT 10  ALKPHOS 55  BILITOT 1.3*  PROT 6.7  ALBUMIN 3.5   No results for input(s): LIPASE, AMYLASE in the last 168 hours. No results for input(s): AMMONIA in the  last 168 hours.  ABG    Component Value Date/Time   PHART 7.330 (L) 03/15/2018 1938   PCO2ART 48.6 (H) 03/15/2018 1938   PO2ART 353 (H) 03/15/2018 1938   HCO3 24.4 03/15/2018 1938   TCO2 23 09/13/2017 2030   ACIDBASEDEF 0.8 03/15/2018 1938   O2SAT 99.5 03/15/2018 1938     Coagulation Profile: Recent Labs  Lab 03/15/18 0913  INR 1.49    Cardiac Enzymes: No results for input(s): CKTOTAL, CKMB, CKMBINDEX, TROPONINI in the last 168 hours.  HbA1C: Hgb A1c MFr Bld  Date/Time Value Ref Range Status  12/22/2017 03:28 PM 6.3 4.6 - 6.5 % Final    Comment:    Glycemic Control Guidelines for People with Diabetes:Non Diabetic:  <6%Goal of Therapy: <7%Additional Action Suggested:  >8%   09/23/2017 12:01 PM 6.8 (H) 4.6 - 6.5 % Final    Comment:    Glycemic Control Guidelines for People  with Diabetes:Non Diabetic:  <6%Goal of Therapy: <7%Additional Action Suggested:  >8%     CBG: Recent Labs  Lab 03/15/18 2210  GLUCAP 183*    Critical care time: 30 minutes     Noe Gens, NP-C [facility] Pgr: [phone] or if no answer [phone] 03/16/2018, 7:54 AM

## 2018-03-16 NOTE — Care Management Note (Signed)
Case Management Note  Patient Details  Name: [name] [name] MRN: [id number] Date of Birth: 09/10/1932  Subjective/Objective:                  On full vent support  Action/Plan: Will follow for progression of care and clinical status. Will follow for case management needs none present at this time.  Expected Discharge Date:  (unknown)               Expected Discharge Plan:  Home/Self Care  In-House Referral:     Discharge planning Services  CM Consult  Post Acute Care Choice:    Choice offered to:     DME Arranged:    DME Agency:     HH Arranged:    HH Agency:     Status of Service:  In process, will continue to follow  If discussed at Long Length of Stay Meetings, dates discussed:    Additional Comments:  Leeroy Cha, RN 03/16/2018, 9:50 AM

## 2018-03-16 NOTE — Consult Note (Signed)
Renal Service Consult Note [facility]  [name] 03/16/2018 Sol Blazing Requesting Physician:  Dr [facility]  Reason for Consult:  Wegener's patient w/ hemoptysis and poss relapse HPI: The patient is a 84 y.o. 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  She was treated w/ steroids and Rituximab and renal function has been relatively stable since then, she is now on pred 37m daily.  Per family has been in hospital frequently 2-3 times per years, usually w/ "pneumonia".  She is admitted now w/ acute resp failure, hemoptysis and patchy to diffuse bilat lung infiltrates on CT scan. We are asked to see for renal failure.    UA yest showed 30 protein and 21-50 rbc's.  Creat is 1.97 here, baseline creat from June 2019 was 1.7- 2.1.  Creat in 2014 when Wegener's was dx'd was around 1.6- 1.9.  Pt unable to give any hx.      ROS  n/a   Past Medical History  Past Medical History:  Diagnosis Date  . Arthritis    r tkr  . [place] cancer [place]   . Chronic atrial fibrillation    CHADS2-VASc=6.  on low dose Eliquis (corrected for age & renal fxn)  . CVA (cerebral vascular accident) [place]   mini stroke.no residual  . DEPRESSION 05/16/2009  . Diabetes mellitus type II   . Heart murmur    stress test 2009.dr peter j[place]ique . HYPERLIPIDEMIA 07/24/2008  . HYPERTENSION 07/24/2008  . HYPOTHYROIDISM 07/24/2008  . Intermittent vertigo   . Metastatic carcinoid tumor to intra-abdominal site ([place]   . Pneumonia   . Pulmonary hypertension ([place]    Mod PHTN on Echo 03/2016: 2/2 combination of Wegener's, HFPF & Afib.  . Skin abnormality    facial lesions .pt applying mupiracin to areas  . Vertigo   . Wegener's disease, pulmonary ([place] 10/2012   Past Surgical History  Past Surgical History:  Procedure Laterality Date  . BREAST SURGERY  2007   Lumpectomy,  XRT 2006.l breast  . CHOLECYSTECTOMY  1980  . EXPLORATORY LAPAROTOMY    . HEEL SPUR SURGERY Right   . JOINT REPLACEMENT     r knee  . KNEE SURGERY  2009   TKR  . TEE WITHOUT CARDIOVERSION N/A 09/16/2017   Procedure: TRANSESOPHAGEAL ECHOCARDIOGRAM (TEE);  Surgeon: MLarey Dresser MD;  [facility];  Service: Cardiovascular;  Laterality: N/A;  . TRANSTHORACIC ECHOCARDIOGRAM  03/2016   Afib (no Diastolic Fxn). EF 55-60%. No RWMA. Mild Aortic dilation Mild MR. Severe LA dilation. Mod RA dilation.  Mod TR with Mod-Severe Pulmonary HTN - but normal RV function..  . VENTRAL HERNIA REPAIR N/A 07/01/2016   Procedure: REPAIR OF INCARCERATED INCISIONAL HERNIA ;  Surgeon: FStark Klein MD;  Location: [facility];  Service: General;  Laterality: N/A;  . VIDEO ASSISTED THORACOSCOPY  04/22/2011   Procedure: VIDEO ASSISTED THORACOSCOPY;  Surgeon: SMelrose Nakayama MD;  [place]  Service: Thoracic;  Laterality: Left;  WITH BIOPSY   Family History  Family History  Problem Relation Age of Onset  . Arthritis Mother   . Rheum arthritis Mother   . Heart disease Father   . Coronary artery disease Father   . Cancer Sister        breast CA, both sisters  . Breast cancer Sister   .  Coronary artery disease Brother   . Lung cancer Brother    Social History  reports that she quit smoking about 35 years ago. Her smoking use included cigarettes. She has a 6.00 pack-year smoking history. She has never used smokeless tobacco. She reports that she does not drink alcohol or use drugs. Allergies  Allergies  Allergen Reactions  . Codeine Sulfate Other (See Comments)    REACTION: GI upset  . Sulfonamide Derivatives Other (See Comments)    REACTION: GI upset  . Vancomycin Other (See Comments)    Red man syndrome  . Atarax [Hydroxyzine] Nausea Only and Rash   Home medications Prior to Admission medications   Medication Sig Start Date End Date Taking? Authorizing Provider  acetaminophen (TYLENOL)  500 MG tablet Take 1,000 mg by mouth every 6 (six) hours as needed for headache.    Yes [provider]  amLODipine (NORVASC) 5 MG tablet Take 5 mg by mouth at bedtime.  09/25/17  Yes [provider]  BYSTOLIC 10 MG tablet TAKE 1 TABLET BY MOUTH ONCE DAILY AFTER  BREAKFAST Patient taking differently: Take 10 mg by mouth daily.  02/22/18  Yes Burchette, Alinda Sierras, MD  diazepam (VALIUM) 5 MG tablet TAKE 1 TABLET BY MOUTH EVERY 12 HOURS AS NEEDED FOR SEVERE VERTIGO FLARES. AVOID REGULAR USE. Patient taking differently: Take 2.5-5 mg by mouth every 12 (twelve) hours as needed (vertigo).  11/02/17  Yes Burchette, Alinda Sierras, MD  ELIQUIS 2.5 MG TABS tablet TAKE 1 TABLET BY MOUTH TWICE DAILY Patient taking differently: Take 2.5 mg by mouth 2 (two) times daily.  08/26/17  Yes Burchette, Alinda Sierras, MD  furosemide (LASIX) 20 MG tablet Take 1 tablet (20 mg total) by mouth daily. 09/04/16  Yes Burchette, Alinda Sierras, MD  gabapentin (NEURONTIN) 100 MG capsule TAKE 1 CAPSULE BY MOUTH TWICE DAILY BEFORE MEAL(S) Patient taking differently: Take 100 mg by mouth 2 (two) times daily at 8 am and 10 pm.  10/29/17  Yes Burchette, Alinda Sierras, MD  glimepiride (AMARYL) 2 MG tablet TAKE ONE TABLET BY MOUTH ONCE DAILY BEFORE BREAKFAST Patient taking differently: Take 2 mg by mouth daily as needed (high blood sugar).  10/07/16  Yes Burchette, Alinda Sierras, MD  guaiFENesin (MUCINEX) 600 MG 12 hr tablet Take 1 tablet (600 mg total) by mouth 2 (two) times daily as needed for cough. 09/17/17  Yes Debbe Odea, MD  levothyroxine (SYNTHROID, LEVOTHROID) 100 MCG tablet TAKE 1 TABLET BY MOUTH ONCE DAILY BEFORE BREAKFAST. Patient taking differently: Take 100 mcg by mouth daily before breakfast.  07/27/17  Yes Burchette, Alinda Sierras, MD  Multiple Vitamins-Minerals (CENTRUM SILVER 50+WOMEN) TABS Take 1 tablet by mouth 3 (three) times a week.   Yes [provider]  predniSONE (DELTASONE) 5 MG tablet TAKE 1 TABLET BY MOUTH ONCE DAILY WITH   BREAKFAST Patient taking differently: Take 5 mg by mouth daily with breakfast.  03/11/18  Yes Young, Clinton D, MD  feeding supplement, ENSURE ENLIVE, (ENSURE ENLIVE) LIQD Take 237 mLs by mouth 2 (two) times daily between meals. Patient not taking: Reported on 03/15/2018 09/18/17   Debbe Odea, MD  ondansetron (ZOFRAN-ODT) 4 MG disintegrating tablet Take 1 tablet (4 mg total) by mouth every 8 (eight) hours as needed for nausea or vomiting. Patient not taking: Reported on 03/15/2018 09/18/17   [name], MD   Liver Function Tests Recent Labs  Lab 03/15/18 0913  AST 16  ALT 10  ALKPHOS 55  BILITOT 1.3*  PROT 6.7  ALBUMIN 3.5   No results for input(s): LIPASE, AMYLASE in the last 168 hours. CBC Recent Labs  Lab 03/15/18 0913 03/16/18 0241  WBC 8.6 13.1*  NEUTROABS 6.6  --   HGB 7.2* 7.8*  HCT 22.8* 25.3*  MCV 97.9 101.2*  PLT 210 891   Basic Metabolic Panel Recent Labs  Lab 03/15/18 0913 03/16/18 0241  NA 132* 134*  K 4.1 4.7  CL 99 99  CO2 23 21*  GLUCOSE 150* 203*  BUN 36* 37*  CREATININE 2.04* 1.97*  CALCIUM 8.1* 8.3*   Iron/TIBC/Ferritin/ %Sat    Component Value Date/Time   IRON 27 (L) 09/27/2014 0449   TIBC 242 (L) 09/27/2014 0449   FERRITIN 291 09/27/2014 0449   IRONPCTSAT 11 09/27/2014 0449   IRONPCTSAT 21 01/24/2011 0939    Vitals:   03/16/18 1100 03/16/18 1154 03/16/18 1200 03/16/18 1300  BP: (!) 159/58  (!) 161/72 (!) 168/68  Pulse: 68  73 71  Resp: 16  (!) 22 (!) 22  Temp: 99 F (37.2 C)  99.3 F (37.4 C) 99.5 F (37.5 C)  TempSrc:   Core   SpO2: 99% 98% 99% 99%  Weight:      Height:       Exam Gen on vent , sedated, elderly WF No rash, cyanosis or gangrene Sclera anicteric, throat w ETT  No jvd or bruits Chest clear bilat RRR no MRG Abd soft ntnd no mass or ascites +bs GU defer MS no joint effusions or deformity Ext no LE edema, no wounds or ulcers Neuro is sedate on the vent     Impression/ Plan: 1. Renal failure -  chronic w/ suspected Wegener's component, but patient never biopsied (treated empirically w/ +ANCA/ prot 3 in 2014).  Creat is near her baseline and UA shows low grade proteinuria and microhematuria.  Not convinced that this is a renal flare-up, have d/w primary team.  Will be available as needed.  2. Acute resp failure/ hemoptysis - hx of Wegener's/ +c-ANCA/ +prot 3 in 2014 3. VDRF     Kelly Splinter MD [facility] pager [phone]   03/16/2018, 2:43 PM

## 2018-03-16 NOTE — Progress Notes (Signed)
Initial Nutrition Assessment  DOCUMENTATION CODES:   Non-severe (moderate) malnutrition in context of chronic illness  INTERVENTION:  - Will order 30 mL Prostat once/day with Osmolite 1.2 @ 35 mL/hr to advance by 10 mL every 8 hours to reach goal rate of Osmolite 1.2 @ 55 mL/hr. - At goal rate, this regimen will provide 1684 kcal, 88 grams of protein, and 1082 mL free water.  - Free water flush, if desired, to be per CCM.   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  GOAL:   Patient will meet greater than or equal to 90% of their needs  MONITOR:   Vent status, TF tolerance, Weight trends, Labs  REASON FOR ASSESSMENT:   Ventilator, Consult Enteral/tube feeding initiation and management  ASSESSMENT:   84 y.o. female with medical history significant of Wegener's disease, chronic atrial fibrillation, dyslipidemia, hypothyroidism, and HTN. Her CT chest from June suggested possible progression of Wegener's disease. Patient has been feeling unwell for the last 10 days-postnasal drip, coughing, and occasional hemoptysis. Due to persistent symptoms she came to the hospital for further evaluation.  BMI indicates normal weight. Patient intubated with OGT in place. Spoke with patient's son and daughter-in-law, who are at bedside. Visit brief as they were taking phone calls and responding to text messages. PTA, patient enjoyed fruits, vegetables, and chicken. She did not have any difficulties with chewing or swallowing that family is aware of. Patient mainly consumes small portions at meals or nibbles.   NFPE outlined below. Family requested that patient not be awoken and be able to remain calm so depletions may be more extensive, but a light touch was used during NFPE. Per chart review, current weight is 151 kb and weight appears to have been mainly stable for the past 7 months. Family  feels that patient has lost a significant amount of weight "maybe 40 pounds" in the past 1 year.   Per Brandi's note today: CAP, concern for possible small vessel vasculitis, plan for bronch, acute on CKD.   Patient is currently intubated on ventilator support MV: 10.1 L/min Temp (24hrs), Avg:99.2 F (37.3 C), Min:97.7 F (36.5 C), Max:101.8 F (38.8 C) Propofol: none BP: 162/65 and MAP: 98  Medications reviewed; 20 mg Lasix per OGT/day starting 12/11, 100 mcg Synthroid per OGT/day, 250 mg Solu-medrol QID.  Labs reviewed; Na: 134 mmol/L, BUN: 37 mg/dL, creatinine: 1.97 mg/dL, Ca: 8.3 mg/dL, GFR: 23 mL/min. Drip; precedex @ 0.9 mcg/kg/hr.     NUTRITION - FOCUSED PHYSICAL EXAM:    Most Recent Value  Orbital Region  Unable to assess  Upper Arm Region  Mild depletion  Thoracic and Lumbar Region  Unable to assess  Buccal Region  No depletion  Temple Region  Unable to assess  Clavicle Bone Region  No depletion  Clavicle and Acromion Bone Region  Mild depletion  Scapular Bone Region  Unable to assess  Dorsal Hand  Unable to assess  Patellar Region  Mild depletion  Anterior Thigh Region  Unable to assess  Posterior Calf Region  Mild depletion  Edema (RD Assessment)  None  Hair  Reviewed  Eyes  Unable to assess  Mouth  Unable to assess  Skin  Reviewed  Nails  Unable to assess       Diet Order:   Diet Order            Diet NPO time specified  Diet effective now  EDUCATION NEEDS:   No education needs have been identified at this time  Skin:  Skin Assessment: Reviewed RN Assessment  Last BM:  12/8  Height:   Ht Readings from Last 1 Encounters:  03/15/18 5' 6" (1.676 m)    Weight:   Wt Readings from Last 1 Encounters:  03/15/18 68.6 kg    Ideal Body Weight:  59.09 kg  BMI:  Body mass index is 24.41 kg/m.  Estimated Nutritional Needs:   Kcal:  1698 kcal  Protein:  82-103 grams  Fluid:  >/= 1.7 L/day     Jarome Matin, MS, RD,  LDN, [facility] Inpatient Clinical Dietitian Pager # [phone] After hours/weekend pager # [phone]

## 2018-03-17 ENCOUNTER — Inpatient Hospital Stay (HOSPITAL_COMMUNITY): Payer: Medicare Other

## 2018-03-17 DIAGNOSIS — M3131 Wegener's granulomatosis with renal involvement: Secondary | ICD-10-CM

## 2018-03-17 LAB — COMPREHENSIVE METABOLIC PANEL
ALK PHOS: 48 U/L (ref 38–126)
ALT: 9 U/L (ref 0–44)
AST: 15 U/L (ref 15–41)
Albumin: 3.1 g/dL — ABNORMAL LOW (ref 3.5–5.0)
Anion gap: 15 (ref 5–15)
BUN: 60 mg/dL — ABNORMAL HIGH (ref 8–23)
CO2: 21 mmol/L — ABNORMAL LOW (ref 22–32)
Calcium: 8.3 mg/dL — ABNORMAL LOW (ref 8.9–10.3)
Chloride: 100 mmol/L (ref 98–111)
Creatinine, Ser: 2.16 mg/dL — ABNORMAL HIGH (ref 0.44–1.00)
GFR calc Af Amer: 24 mL/min — ABNORMAL LOW (ref >60)
GFR calc non Af Amer: 21 mL/min — ABNORMAL LOW (ref >60)
Glucose, Bld: 276 mg/dL — ABNORMAL HIGH (ref 70–99)
POTASSIUM: 3.8 mmol/L (ref 3.5–5.1)
SODIUM: 136 mmol/L (ref 135–145)
Total Bilirubin: 0.8 mg/dL (ref 0.3–1.2)
Total Protein: 7.1 g/dL (ref 6.5–8.1)

## 2018-03-17 LAB — GLUCOSE, CAPILLARY
Glucose-Capillary: 263 mg/dL — ABNORMAL HIGH (ref 70–99)
Glucose-Capillary: 276 mg/dL — ABNORMAL HIGH (ref 70–99)
Glucose-Capillary: 352 mg/dL — ABNORMAL HIGH (ref 70–99)
Glucose-Capillary: 263 mg/dL — ABNORMAL HIGH (ref 70–99)
Glucose-Capillary: 163 mg/dL — ABNORMAL HIGH (ref 70–99)
Glucose-Capillary: 239 mg/dL — ABNORMAL HIGH (ref 70–99)
Glucose-Capillary: 227 mg/dL — ABNORMAL HIGH (ref 70–99)
Glucose-Capillary: 196 mg/dL — ABNORMAL HIGH (ref 70–99)

## 2018-03-17 LAB — CBC
HCT: 23.9 % — ABNORMAL LOW (ref 36.0–46.0)
Hemoglobin: 7.6 g/dL — ABNORMAL LOW (ref 12.0–15.0)
MCH: 31.1 pg (ref 26.0–34.0)
MCHC: 31.8 g/dL (ref 30.0–36.0)
MCV: 98 fL (ref 80.0–100.0)
Platelets: 244 10*3/uL (ref 150–400)
RBC: 2.44 MIL/uL — ABNORMAL LOW (ref 3.87–5.11)
RDW: 13.4 % (ref 11.5–15.5)
WBC: 6.6 10*3/uL (ref 4.0–10.5)
nRBC: 0.3 % — ABNORMAL HIGH (ref 0.0–0.2)

## 2018-03-17 LAB — ACID FAST SMEAR (AFB, MYCOBACTERIA): Acid Fast Smear: NEGATIVE

## 2018-03-17 LAB — PHOSPHORUS: Phosphorus: 4.5 mg/dL (ref 2.5–4.6)

## 2018-03-17 LAB — ANCA TITERS
Atypical P-ANCA titer: <1:20 {titer}
C-ANCA: 1:640 {titer}

## 2018-03-17 LAB — MAGNESIUM: Magnesium: 2.2 mg/dL (ref 1.7–2.4)

## 2018-03-17 MED ORDER — DIPHENHYDRAMINE HCL 25 MG PO CAPS
25.0000 mg | ORAL_CAPSULE | Freq: Once | ORAL | Status: AC
  Administered 2018-03-18: 25 mg via ORAL
  Filled 2018-03-17: qty 1

## 2018-03-17 MED ORDER — ORAL CARE MOUTH RINSE
15.0000 mL | Freq: Two times a day (BID) | OROMUCOSAL | Status: DC
  Administered 2018-03-17 – 2018-03-20 (×4): 15 mL via OROMUCOSAL

## 2018-03-17 MED ORDER — CHLORHEXIDINE GLUCONATE 0.12 % MT SOLN
15.0000 mL | Freq: Two times a day (BID) | OROMUCOSAL | Status: DC
  Administered 2018-03-17 – 2018-03-21 (×7): 15 mL via OROMUCOSAL
  Filled 2018-03-17 (×5): qty 15

## 2018-03-17 MED ORDER — LORAZEPAM 1 MG PO TABS
1.0000 mg | ORAL_TABLET | Freq: Once | ORAL | Status: AC
  Administered 2018-03-18: 1 mg via ORAL
  Filled 2018-03-17: qty 1

## 2018-03-17 MED ORDER — SODIUM CHLORIDE 0.9 % IV SOLN
375.0000 mg/m2 | Freq: Once | INTRAVENOUS | Status: AC
  Administered 2018-03-18: 700 mg via INTRAVENOUS
  Filled 2018-03-17: qty 50

## 2018-03-17 MED ORDER — ACETAMINOPHEN 325 MG PO TABS
650.0000 mg | ORAL_TABLET | Freq: Once | ORAL | Status: AC
  Administered 2018-03-18: 650 mg via ORAL
  Filled 2018-03-17: qty 2

## 2018-03-17 NOTE — Evaluation (Signed)
Clinical/Bedside Swallow Evaluation Patient Details  Name: [name] [name] MRN: [id number] Date of Birth: [birth year]  Today's Date: 03/17/2018 Time: SLP Start Time (ACUTE ONLY): 1339 SLP Stop Time (ACUTE ONLY): 1415 SLP Time Calculation (min) (ACUTE ONLY): 36 min  Past Medical History:  Past Medical History:  Diagnosis Date  . Arthritis    r tkr  . Breast cancer ([place])   . Chronic atrial fibrillation    CHADS2-VASc=6.  on low dose Eliquis (corrected for age & renal fxn)  . CVA (cerebral vascular accident) ([place]) 2008   mini stroke.no residual  . DEPRESSION 05/16/2009  . Diabetes mellitus type II   . Heart murmur    stress test 2009.dr peter [place]  . HYPERLIPIDEMIA 07/24/2008  . HYPERTENSION 07/24/2008  . HYPOTHYROIDISM 07/24/2008  . Intermittent vertigo   . Metastatic carcinoid tumor to intra-abdominal site ([place] )   . Pneumonia   . Pulmonary hypertension ([place])    Mod PHTN on Echo 03/2016: 2/2 combination of Wegener's, HFPF & Afib.  . Skin abnormality    facial lesions .pt applying mupiracin to areas  . Vertigo   . 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 BREAST SURGERY  2007   Lumpectomy, XRT 2006.l breast  . CHOLECYSTECTOMY  1980  . EXPLORATORY LAPAROTOMY    . HEEL SPUR SURGERY Right   . JOINT REPLACEMENT     r knee  . KNEE SURGERY  2009   TKR  . TEE WITHOUT CARDIOVERSION N/A 09/16/2017   Procedure: TRANSESOPHAGEAL ECHOCARDIOGRAM (TEE);  Surgeon: Larey Dresser, MD;  Location: [facility] ENDOSCOPY;  Service: Cardiovascular;  Laterality: N/A;  . TRANSTHORACIC ECHOCARDIOGRAM  03/2016   Afib (no Diastolic Fxn). EF 55-60%. No RWMA. Mild Aortic dilation Mild MR. Severe LA dilation. Mod RA dilation.  Mod TR with Mod-Severe Pulmonary HTN - but normal RV function..  . VENTRAL HERNIA REPAIR N/A 07/01/2016   Procedure: REPAIR OF INCARCERATED INCISIONAL HERNIA ;  Surgeon: Stark Klein, MD;  Location: [facility] ORS;   Service: General;  Laterality: N/A;  . VIDEO ASSISTED THORACOSCOPY  04/22/2011   Procedure: VIDEO ASSISTED THORACOSCOPY;  Surgeon: Melrose Nakayama, MD;  Location: [facility] OR;  Service: Thoracic;  Laterality: Left;  WITH BIOPSY   HPI:  84 yo female adm to [facility] with hemoptysis - required intubation 03/15/18 pm = extubated 12/11 am.  Pt with h/o breast cancer, Wegener's disease. Swallow evaluation ordered.  Pt has h/o n/v with UGI 11/2001 completed moderate hiatal hernia - no esophagitis or strictures.  Pt denies dysphagia stating she can take large pills with liquids without deficits.     Assessment / Plan / Recommendation Clinical Impression  Pt presents with functional oropharyngeal swallow function.  No indication of airway compromise with all po intake.  Pt easily passed 3 ounce water test.  Her voice remained clear with no indications of dysphagia.   Her swallow appeared timely with clear voice throughout intake.  Recommend pt have a regular/thin diet -   No SLP follow up needed.  SlP to sign off.  Educated pt and son to recommendations.   SLP Visit Diagnosis: Dysphagia, unspecified (R13.10)    Aspiration Risk  No limitations    Diet Recommendation Regular;Thin liquid   Liquid Administration via: Cup;Straw Medication Administration: Whole meds with liquid Supervision: Patient able to self feed Compensations: Slow rate;Small sips/bites Postural Changes: Seated upright at 90 degrees;Remain upright for at least 30 minutes after po  intake    Other  Recommendations     Follow up Recommendations None      Frequency and Duration            Prognosis        Swallow Study   General Date of Onset: 03/15/18 HPI: 84 yo female adm to [facility] with hemoptysis - required intubation 03/15/18 pm = extubated 12/11 am.  Pt with h/o breast cancer. Swallow evaluation ordered.  Pt has h/o n/v with UGI 11/2001 completed moderate hiatal hernia - no esophagitis or strictures.  Pt denies dysphagia stating she  can take large pills with liquids without deficits.   Diet Prior to this Study: NPO Temperature Spikes Noted: Yes(100.4) Respiratory Status: Room air History of Recent Intubation: Yes Length of Intubations (days): 3 days Date extubated: 03/17/18 Behavior/Cognition: Alert;Cooperative;Pleasant mood Oral Cavity Assessment: Within Functional Limits Oral Care Completed by SLP: No Oral Cavity - Dentition: Adequate natural dentition Vision: Functional for self-feeding Self-Feeding Abilities: Able to feed self Patient Positioning: Upright in bed Baseline Vocal Quality: Other (comment)(mildly hoarse with HOB lowered, cleared with throat clear and reswallow) Volitional Cough: Strong Volitional Swallow: Able to elicit    Oral/Motor/Sensory Function Overall Oral Motor/Sensory Function: (? minimal left facial asymmetry - nasofold - suspect this is from TIA approximately 15 years ago)  Son also states pt's vent mask may have contributed to flattening of left facial region.     Ice Chips Ice chips: Not tested   Thin Liquid Thin Liquid: Within functional limits Presentation: Cup;Straw Other Comments: pt passed 3 ounce water test    Nectar Thick Nectar Thick Liquid: Not tested   Honey Thick Honey Thick Liquid: Not tested   Puree Puree: Within functional limits Presentation: Self Fed;Spoon   Solid     Solid: Within functional limits Presentation: Self Fredirick Lathe 03/17/2018,2:38 PM Luanna Salk, [facility] SLP [place] Pager [phone] Office [phone]

## 2018-03-17 NOTE — Procedures (Signed)
Extubation Procedure Note  Patient Details:   Name: [name] [name] DOB: 1933/02/19 MRN: [id number]   Airway Documentation:    Vent end date: (not recorded) Vent end time: (not recorded)   Evaluation  O2 sats: stable throughout Complications: No apparent complications Patient did tolerate procedure well. Bilateral Breath Sounds: Diminished   Yes  Elsie Stain 03/17/2018, 11:28 AM

## 2018-03-17 NOTE — Progress Notes (Signed)
eLink Physician-Brief Progress Note Patient Name: [name] [name] DOB: November 23, 1932 MRN: [id number]   Date of Service  03/17/2018  HPI/Events of Note  Pt requesting for sleep aide.  Reviewed home medications and pt takes valium at home.  eICU Interventions  Ativan 75m PO ordered.     Intervention Category Minor Interventions: Other:  VElsie Lincoln12/02/2018, 11:47 PM

## 2018-03-17 NOTE — Progress Notes (Signed)
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 Annamaria Boots who presented with shortness of breath and hemoptysis.  2 weeks prior to admit she had a severe headache and episode of epistaxis.  Admitted 12/9 per [facility] for evaluation of SOB.  HRCT showed diffuse bilateral airspace disease, patchy consolidation, air bronchogram, GGO and tracheobronchomalacia.  She developed worsening respiratory distress and required intubation.    Past Medical History  Small vessel vasculitis, proteinase 3 antibody positive, pulmonary hypertension, carcinoid tumor, diabetes mellitus type 2, stroke, A. fib,  Significant Hospital Events   12/09  Admit with SOB, acute onset hemoptysis > intubated late pm 12/10  Remains on vent, PEEP5 / FiO2 30%. FOB with diffuse bloody secretions, no active bleed  Consults:  12/9 PCCM  Procedures:  ETT 12/9 >>   Significant Diagnostic Tests:  12/9 CXR >> consolidation in the left upper lobe as well as the right upper lobe, diffuse bilateral airspace disease  HRCT 12/9 >> appearance of lungs most compatible with severe multilobar bronchopneumonia, diffuse alveolar hemorrhage could also be considered, cardiomegaly with biatrial dilatation, tracheobronchomalacia, 2.2cm low-attenuation lesion in the tail of the pancreas similar dating back to 2015, hepatic steatosis ANCA Titers 12/9 >>  ANCA Proteinase 3 12/9 >> greater than 100  Micro Data:  BCx2 12/9 >>  U. Strep 12/10 >> negative  UA 12/9 >> moderate Hgb, 30 protein, 21-50 RBC BAL 12/10 >>   Antimicrobials:  Ceftriaxone 12/9 >> Azithromycin 12/9 >>  Interim  history/subjective:  Tmax 100.4.  I/O-net neg 1.4L.  Pt indicates she is hopeful to have tube removed.  Family at bedside.   Objective   Blood pressure (!) 182/84, pulse 73, temperature 99.5 F (37.5 C), resp. rate (!) 22, height _0  (1.676 m), weight 68.6 kg, SpO2 99 %.    Vent Mode: PRVC FiO2 (%):  [30 %] 30 % Set Rate:  [22 bmp] 22 bmp Vt Set:  [470 mL] 470 mL PEEP:  [5 cmH20] 5 cmH20 Plateau Pressure:  [20 cmH20-24 cmH20] 24 cmH20   Intake/Output Summary (Last 24 hours) at 03/17/2018 0816 Last data filed at 03/17/2018 0600 Gross per 24 hour  Intake 968.32 ml  Output 1925 ml  Net -956.68 ml   Filed Weights   03/15/18 0905 03/15/18 1500  Weight: 70.7 kg 68.6 kg    Examination: General: elderly female lying in bed on vent in NAD   HEENT: MM pink/moist, ETT Neuro: Awake, alert, writing messages / appropriate, MAE  CV: s1s2 rrr, no m/r/g PULM: even/non-labored, lungs bilaterally clear anterior, diminished bases  DG:LOVF, non-tender, bsx4 active  Extremities: warm/dry, no edema  Skin: no rashes or lesions  Resolved Hospital Problem list     Assessment & Plan:   Acute respiratory failure with hypoxemia: -empiric Rx for CAP but course consistent with small vessel vasculitis P: PRVC as rest mode  PSV wean as able  FiO2 to support sats > 90% Follow CXR  Follow cultures  Intermittent CXR  Solu-medrol 250 mg IV Q6 for 72 hours, started on 12/9 Lasix 20 mg QD PT (home med) Stop date added to abx, D3/5  Afib P: Tele monitoring Hold home eliquis with hemoptysis   Hemoptysis P: Complete 5 days abx Steroids as above   Acute on chronic kidney failure: worrisome for small vessel vasculitis -UA with moderate Hgb, 30 protein, 21-50 RBC -baseline sr cr 1.6-1.9, Nephrology does not feel renal involvement at this time  P: Trend BMP / urinary output Replace electrolytes as indicated Avoid nephrotoxic agents, ensure adequate renal perfusion  At Risk Malnutrition    P: Hold TF this am for PSV  Hypothyroidism  P: Synthroid PT   History of carcinoid tumor P: CT as above   History of breast cancer P: Monitor/supportive care   Best practice:  Diet: NPO / TF  Pain/Anxiety/Delirium protocol (if indicated): in place  VAP protocol (if indicated): ordered  DVT prophylaxis: SCD GI prophylaxis: pepcid PT Glucose control:  Mobility: bed rest  Code Status: full code Family Communication: Son, grandson and patient updated at bedside am 12/11 Disposition: ICU  Labs   CBC: Recent Labs  Lab 03/15/18 0913 03/16/18 0241 03/17/18 0303  WBC 8.6 13.1* 6.6  NEUTROABS 6.6  --   --   HGB 7.2* 7.8* 7.6*  HCT 22.8* 25.3* 23.9*  MCV 97.9 101.2* 98.0  PLT 210 197 287    Basic Metabolic Panel: Recent Labs  Lab 03/15/18 0913 03/16/18 0241 03/17/18 0303  NA 132* 134* 136  K 4.1 4.7 3.8  CL 99 99 100  CO2 23 21* 21*  GLUCOSE 150* 203* 276*  BUN 36* 37* 60*  CREATININE 2.04* 1.97* 2.16*  CALCIUM 8.1* 8.3* 8.3*  MG  --   --  2.2  PHOS  --   --  4.5   GFR: Estimated Creatinine Clearance: 18.5 mL/min (A) (by C-G formula based on SCr of 2.16 mg/dL (H)). Recent Labs  Lab 03/15/18 0913 03/16/18 0241 03/17/18 0303  WBC 8.6 13.1* 6.6  LATICACIDVEN 1.04  --   --     Liver Function Tests: Recent Labs  Lab 03/15/18 0913 03/17/18 0303  AST 16 15  ALT 10 9  ALKPHOS 55 48  BILITOT 1.3* 0.8  PROT 6.7 7.1  ALBUMIN 3.5 3.1*   No results for input(s): LIPASE, AMYLASE in the last 168 hours. No results for input(s): AMMONIA in the last 168 hours.  ABG    Component Value Date/Time   PHART 7.330 (L) 03/15/2018 1938   PCO2ART 48.6 (H) 03/15/2018 1938   PO2ART 353 (H) 03/15/2018 1938   HCO3 24.4 03/15/2018 1938   TCO2 23 09/13/2017 2030   ACIDBASEDEF 0.8 03/15/2018 1938   O2SAT 99.5 03/15/2018 1938     Coagulation Profile: Recent Labs  Lab 03/15/18 0913  INR 1.49    Cardiac Enzymes: No results for input(s): CKTOTAL, CKMB,  CKMBINDEX, TROPONINI in the last 168 hours.  HbA1C: Hgb A1c MFr Bld  Date/Time Value Ref Range Status  12/22/2017 03:28 PM 6.3 4.6 - 6.5 % Final    Comment:    Glycemic Control Guidelines for People with Diabetes:Non Diabetic:  <6%Goal of Therapy: <7%Additional Action Suggested:  >8%   09/23/2017 12:01 PM 6.8 (H) 4.6 - 6.5 % Final    Comment:    Glycemic Control Guidelines for People with Diabetes:Non Diabetic:  <6%Goal of Therapy: <7%Additional Action Suggested:  >8%     CBG: Recent Labs  Lab 03/16/18 1945 03/16/18 2208 03/17/18 0106 03/17/18 0333 03/17/18 0555  GLUCAP 243* 257* 263* 276* 352*    Critical care time: 30 minutes     Noe Gens,  NP-C [place] Pgr: [phone] or if no answer [phone] 03/17/2018, 8:16 AM

## 2018-03-18 ENCOUNTER — Inpatient Hospital Stay (HOSPITAL_COMMUNITY): Payer: Medicare Other

## 2018-03-18 DIAGNOSIS — M3130 Wegener's granulomatosis without renal involvement: Secondary | ICD-10-CM

## 2018-03-18 LAB — CBC
HCT: 22.6 % — ABNORMAL LOW (ref 36.0–46.0)
Hemoglobin: 7.1 g/dL — ABNORMAL LOW (ref 12.0–15.0)
MCH: 31.1 pg (ref 26.0–34.0)
MCHC: 31.4 g/dL (ref 30.0–36.0)
MCV: 99.1 fL (ref 80.0–100.0)
Platelets: 284 10*3/uL (ref 150–400)
RBC: 2.28 MIL/uL — ABNORMAL LOW (ref 3.87–5.11)
RDW: 13.7 % (ref 11.5–15.5)
WBC: 9.8 10*3/uL (ref 4.0–10.5)
nRBC: 0.3 % — ABNORMAL HIGH (ref 0.0–0.2)

## 2018-03-18 LAB — CULTURE, RESPIRATORY W GRAM STAIN: Gram Stain: NONE SEEN

## 2018-03-18 LAB — GLUCOSE, CAPILLARY
Glucose-Capillary: 205 mg/dL — ABNORMAL HIGH (ref 70–99)
Glucose-Capillary: 205 mg/dL — ABNORMAL HIGH (ref 70–99)
Glucose-Capillary: 233 mg/dL — ABNORMAL HIGH (ref 70–99)
Glucose-Capillary: 239 mg/dL — ABNORMAL HIGH (ref 70–99)
Glucose-Capillary: 249 mg/dL — ABNORMAL HIGH (ref 70–99)
Glucose-Capillary: 281 mg/dL — ABNORMAL HIGH (ref 70–99)

## 2018-03-18 LAB — BASIC METABOLIC PANEL
Anion gap: 12 (ref 5–15)
BUN: 71 mg/dL — ABNORMAL HIGH (ref 8–23)
CO2: 23 mmol/L (ref 22–32)
Calcium: 8.4 mg/dL — ABNORMAL LOW (ref 8.9–10.3)
Chloride: 101 mmol/L (ref 98–111)
Creatinine, Ser: 2.23 mg/dL — ABNORMAL HIGH (ref 0.44–1.00)
GFR calc Af Amer: 23 mL/min — ABNORMAL LOW (ref >60)
GFR calc non Af Amer: 20 mL/min — ABNORMAL LOW (ref >60)
Glucose, Bld: 207 mg/dL — ABNORMAL HIGH (ref 70–99)
Potassium: 3.4 mmol/L — ABNORMAL LOW (ref 3.5–5.1)
Sodium: 136 mmol/L (ref 135–145)

## 2018-03-18 LAB — CULTURE, RESPIRATORY: CULTURE: NO GROWTH

## 2018-03-18 MED ORDER — GABAPENTIN 250 MG/5ML PO SOLN
100.0000 mg | Freq: Two times a day (BID) | ORAL | Status: DC
  Administered 2018-03-18 (×2): 100 mg via ORAL
  Filled 2018-03-18 (×3): qty 2

## 2018-03-18 MED ORDER — NEBIVOLOL HCL 10 MG PO TABS
10.0000 mg | ORAL_TABLET | Freq: Every day | ORAL | Status: DC
  Administered 2018-03-18 – 2018-03-21 (×4): 10 mg via ORAL
  Filled 2018-03-18 (×4): qty 1

## 2018-03-18 MED ORDER — ENSURE ENLIVE PO LIQD: 237.0000 mL | Freq: Every day | ORAL | Status: DC | PRN

## 2018-03-18 MED ORDER — AZITHROMYCIN 250 MG PO TABS
500.0000 mg | ORAL_TABLET | Freq: Every day | ORAL | Status: AC
  Administered 2018-03-18 – 2018-03-19 (×2): 500 mg via ORAL
  Filled 2018-03-18 (×2): qty 2

## 2018-03-18 MED ORDER — RANITIDINE HCL 150 MG/10ML PO SYRP
150.0000 mg | ORAL_SOLUTION | Freq: Two times a day (BID) | ORAL | Status: DC
  Administered 2018-03-18 (×2): 150 mg via ORAL
  Filled 2018-03-18 (×3): qty 10

## 2018-03-18 MED ORDER — LEVOTHYROXINE SODIUM 100 MCG PO TABS
100.0000 ug | ORAL_TABLET | Freq: Every day | ORAL | Status: DC
  Administered 2018-03-19 – 2018-03-21 (×3): 100 ug via ORAL
  Filled 2018-03-18 (×3): qty 1

## 2018-03-18 NOTE — Progress Notes (Signed)
Rituxan increased to 300 mg/hr, VS stable. Will continue to monitor.

## 2018-03-18 NOTE — Care Management Note (Signed)
Case Management Note  Patient Details  Name: AUTIE VASUDEVAN MRN: [id number] Date of Birth: [birth year]  Subjective/Objective:                  Extubated on [id number]  Action/Plan: Will follow for progression of care and clinical status. Will follow for case management needs none present at this time.  Expected Discharge Date:  (unknown)               Expected Discharge Plan:  Home/Self Care  In-House Referral:     Discharge planning Services  CM Consult  Post Acute Care Choice:    Choice offered to:     DME Arranged:    DME Agency:     HH Arranged:    HH Agency:     Status of Service:  In process, will continue to follow  If discussed at Long Length of Stay Meetings, dates discussed:    Additional Comments:  Leeroy Cha, RN 03/18/2018, 9:02 AM

## 2018-03-18 NOTE — Progress Notes (Signed)
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 Annamaria Boots who presented with shortness of breath and hemoptysis.  2 weeks prior to admit she had a severe headache and episode of epistaxis.  Admitted 12/9 per [facility] for evaluation of SOB.  HRCT showed diffuse bilateral airspace disease, patchy consolidation, air bronchogram, GGO and tracheobronchomalacia.  She developed worsening respiratory distress and required intubation.    Past Medical History  Small vessel vasculitis, proteinase 3 antibody positive, pulmonary hypertension, carcinoid tumor, diabetes mellitus type 2, stroke, A. fib,  Significant Hospital Events   12/09  Admit with SOB, acute onset hemoptysis > intubated late pm 12/10  Remains on vent, PEEP5 / FiO2 30%. FOB with diffuse bloody secretions, no active bleed  Consults:  12/9 PCCM  Procedures:  ETT 12/9 >> 12/11   Significant Diagnostic Tests:  12/9 CXR >> consolidation in the left upper lobe as well as the right upper lobe, diffuse bilateral airspace disease  HRCT 12/9 >> appearance of lungs most compatible with severe multilobar bronchopneumonia, diffuse alveolar hemorrhage could also be considered, cardiomegaly with biatrial dilatation, tracheobronchomalacia, 2.2cm low-attenuation lesion in the tail of the pancreas similar dating back to 2015, hepatic steatosis ANCA Titers 12/9 >>  ANCA Proteinase 3 12/9 >> greater than 100  Micro Data:  BCx2 12/9 >>  U. Strep 12/10 >> negative  UA 12/9 >> moderate Hgb, 30 protein, 21-50 RBC Tracheal aspirate 12/10 >> negative  BAL 12/10 >>   Antimicrobials:  Ceftriaxone  12/9 >> Azithromycin 12/9 >>  Interim history/subjective:  Extubated 12/11, tolerated well.  Tmax 100.4.  No acute events overnight. Pt eating breakfast this am.  Son at bedside.  Pt reports one isolated episode of coughing up "old dark blood" last night.  None since.   Objective   Blood pressure (!) 170/87, pulse 91, temperature 99 F (37.2 C), resp. rate (!) 27, height 5' 6" (1.676 m), weight 68.6 kg, SpO2 98 %.    Vent Mode: PSV;CPAP FiO2 (%):  [30 %] 30 % PEEP:  [5 cmH20] 5 cmH20 Pressure Support:  [5 cmH20] 5 cmH20   Intake/Output Summary (Last 24 hours) at 03/18/2018 0815 Last data filed at 03/18/2018 0700 Gross per 24 hour  Intake 398.65 ml  Output 1750 ml  Net -1351.35 ml   Filed Weights   03/15/18 0905 03/15/18 1500  Weight: 70.7 kg 68.6 kg    Examination: General: elderly female lying in bed in NAD  HEENT: MM pink/moist, no jvd Neuro: AAOx4, speech clear, MAE  CV: s1s2 rrr, no m/r/g PULM: even/non-labored, lungs bilaterally with scattered crackles  AJ:GOTL, non-tender, bsx4 active  Extremities: warm/dry, no edema  Skin: no rashes or lesions  Resolved Hospital Problem list     Assessment & Plan:   Acute respiratory failure with hypoxemia: -empiric Rx for CAP but course consistent with small vessel vasculitis P: Pulmonary hygiene -IS, mobilize  Continue solumedrol 250 mg IV Q6 Will plan to transition to 60 mg Prednisone on 12/13, to stay on 60 mg until discharge then reduce to 40 mg and stay  Rituximab per pharmacy  If decompensates (further bleeding, worsening renal function,  resp failure etc would start IVIG) O2 to support sats > 90% Follow intermittent CXR  Hold home lasix 12/12  D4/5 abx   Afib P: Tele monitoring  Change to SDU status  Hemoptysis P: Steroids as above  Complete 5 days abx   Acute on chronic kidney failure: worrisome for small vessel vasculitis -UA with moderate Hgb, 30 protein, 21-50 RBC -baseline sr cr 1.6-1.9, Nephrology  does not feel renal involvement at this time  P: Hold lasix 12/12  Trend BMP / urinary output Replace electrolytes as indicated Avoid nephrotoxic agents, ensure adequate renal perfusion  At Risk Malnutrition  P: Diet as tolerated   Hypothyroidism  P: Continue synthroid  History of carcinoid tumor History of breast cancer P: Supportive care, follow up as outpatient   Best practice:  Diet: Regular  Pain/Anxiety/Delirium protocol (if indicated): n/a VAP protocol (if indicated): n/a DVT prophylaxis: SCD GI prophylaxis: pepcid  Glucose control:  Mobility: bed rest  Code Status: full code Family Communication: Patient and son updated 12/12 on plan of care  Disposition: SDU  Labs   CBC: Recent Labs  Lab 03/15/18 0913 03/16/18 0241 03/17/18 0303 03/18/18 0257  WBC 8.6 13.1* 6.6 9.8  NEUTROABS 6.6  --   --   --   HGB 7.2* 7.8* 7.6* 7.1*  HCT 22.8* 25.3* 23.9* 22.6*  MCV 97.9 101.2* 98.0 99.1  PLT 210 197 244 681    Basic Metabolic Panel: Recent Labs  Lab 03/15/18 0913 03/16/18 0241 03/17/18 0303 03/18/18 0257  NA 132* 134* 136 136  K 4.1 4.7 3.8 3.4*  CL 99 99 100 101  CO2 23 21* 21* 23  GLUCOSE 150* 203* 276* 207*  BUN 36* 37* 60* 71*  CREATININE 2.04* 1.97* 2.16* 2.23*  CALCIUM 8.1* 8.3* 8.3* 8.4*  MG  --   --  2.2  --   PHOS  --   --  4.5  --    GFR: Estimated Creatinine Clearance: 17.9 mL/min (A) (by C-G formula based on SCr of 2.23 mg/dL (H)). Recent Labs  Lab 03/15/18 0913 03/16/18 0241 03/17/18 0303 03/18/18 0257  WBC 8.6 13.1* 6.6 9.8  LATICACIDVEN 1.04  --   --   --     Liver Function Tests: Recent Labs  Lab 03/15/18 0913 03/17/18 0303  AST 16 15  ALT 10 9  ALKPHOS 55 48  BILITOT 1.3* 0.8  PROT 6.7 7.1  ALBUMIN 3.5 3.1*   No results for input(s): LIPASE, AMYLASE in the last 168 hours. No results for input(s): AMMONIA in the last 168 hours.  ABG    Component Value Date/Time   PHART 7.330 (L) 03/15/2018 1938   PCO2ART 48.6  (H) 03/15/2018 1938   PO2ART 353 (H) 03/15/2018 1938   HCO3 24.4 03/15/2018 1938   TCO2 23 09/13/2017 2030   ACIDBASEDEF 0.8 03/15/2018 1938   O2SAT 99.5 03/15/2018 1938     Coagulation Profile: Recent Labs  Lab 03/15/18 0913  INR 1.49    Cardiac Enzymes: No results for input(s): CKTOTAL, CKMB, CKMBINDEX, TROPONINI in the last 168 hours.  HbA1C: Hgb A1c MFr Bld  Date/Time Value Ref Range Status  12/22/2017 03:28 PM 6.3 4.6 - 6.5 % Final    Comment:    Glycemic Control Guidelines for People with Diabetes:Non Diabetic:  <6%Goal of Therapy: <7%Additional Action Suggested:  >8%   09/23/2017 12:01 PM 6.8 (H) 4.6 - 6.5 % Final    Comment:    Glycemic Control Guidelines for People with  Diabetes:Non Diabetic:  <6%Goal of Therapy: <7%Additional Action Suggested:  >8%     CBG: Recent Labs  Lab 03/17/18 1200 03/17/18 1633 03/17/18 1957 03/17/18 2321 03/18/18 [place], NP-C  Pulmonary & Critical Care Pgr: [phone] or if no answer [phone] 03/18/2018, 8:15 AM

## 2018-03-18 NOTE — Evaluation (Addendum)
Physical Therapy Evaluation Patient Details Name: [name] [name] MRN: [id number] DOB: [birth year] Today's Date: 03/18/2018   History of Present Illness  84 year old female with a past medical history significant for small vessel vasculitis , carcinoid tumor of the lung, breast cancer  presented 03/15/18  with shortness of breath and hemoptysis.  CT  showed diffuse bilateral airspace disease, patchy consolidation, and tracheobronchomalacia.  She developed worsening respiratory distress and required intubation. extubated 12/11.  Clinical Impression  The patient is very weak, attempted ambulation in room bur only made it 4'. Patient may benefit from [place] rehab at DC, patient wants to return home. No family present to discuss DC plan and caregiver support. Pt admitted with above diagnosis. Pt currently with functional limitations due to the deficits listed below (see PT Problem List).  Pt will benefit from skilled PT to increase their independence and safety with mobility to allow discharge to the venue listed below.   Per patient, she assisted spouse  But not physically.    Follow Up Recommendations SNF;Home health PT    Equipment Recommendations  None recommended by PT    Recommendations for Other Services       Precautions / Restrictions Precautions Precautions: Fall Precaution Comments: reports h/o vertigo/dizziness      Mobility  Bed Mobility Overal bed mobility: Needs Assistance Bed Mobility: Supine to Sit     Supine to sit: Mod assist     General bed mobility comments: assist with trunk, scooting  Transfers Overall transfer level: Needs assistance Equipment used: Rolling walker (2 wheeled) Transfers: Sit to/from Stand Sit to Stand: Mod assist;+2 physical assistance;+2 safety/equipment         General transfer comment: asisst to rise from bed and BSC, paTIENT LEANING POSTERIORLY, required constant support.   Ambulation/Gait Ambulation/Gait assistance: Max assist;+2  physical assistance;+2 safety/equipment Gait Distance (Feet): 4 Feet Assistive device: Rolling walker (2 wheeled) Gait Pattern/deviations: Step-to pattern;Staggering left;Staggering right;Leaning posteriorly     General Gait Details: patient leaning posteriorly ,neve got balance. Started to ambulate but unable to go farther. BSC brought to patient and then transferred to recliner. with 2 HHA.  Stairs            Wheelchair Mobility    Modified Rankin (Stroke Patients Only)       Balance Overall balance assessment: Needs assistance Sitting-balance support: Feet supported;Bilateral upper extremity supported Sitting balance-Leahy Scale: Poor   Postural control: Posterior lean Standing balance support: During functional activity;Bilateral upper extremity supported Standing balance-Leahy Scale: Zero Standing balance comment: posterior lean significant                             Pertinent Vitals/Pain Pain Assessment: No/denies pain    Home Living Family/patient expects to be discharged to:: Private residence Living Arrangements: Spouse/significant other Available Help at Discharge: Family Type of Home: House Home Access: Stairs to enter Entrance Stairs-Rails: Psychiatric nurse of Steps: 3 Home Layout: One level Home Equipment: Environmental consultant - 2 wheels;Walker - 4 wheels;Grab bars - toilet;Grab bars - tub/shower Additional Comments: spouse with dementia, 3 sons close and assist    Prior Function Level of Independence: Independent with assistive device(s)               Hand Dominance        Extremity/Trunk Assessment   Upper Extremity Assessment Upper Extremity Assessment: Generalized weakness    Lower Extremity Assessment Lower Extremity Assessment: Generalized weakness    Cervical /  Trunk Assessment Cervical / Trunk Assessment: Kyphotic  Communication      Cognition Arousal/Alertness: Awake/alert Behavior During Therapy: WFL  for tasks assessed/performed Overall Cognitive Status: No family/caregiver present to determine baseline cognitive functioning                                 General Comments: patient followed commands, did repeat same scenario about spouse x 2.      General Comments      Exercises     Assessment/Plan    PT Assessment Patient needs continued PT services  PT Problem List Decreased strength;Decreased activity tolerance;Decreased balance;Decreased mobility;Decreased knowledge of precautions;Decreased safety awareness;Decreased knowledge of use of DME       PT Treatment Interventions DME instruction;Gait training;Functional mobility training;Therapeutic activities;Patient/family education;Therapeutic exercise    PT Goals (Current goals can be found in the Care Plan section)  Acute Rehab PT Goals Patient Stated Goal: to go home PT Goal Formulation: With patient Time For Goal Achievement: 04/01/18 Potential to Achieve Goals: Good    Frequency Min 2X/week   Barriers to discharge Decreased caregiver support      Co-evaluation               AM-PAC PT "6 Clicks" Mobility  Outcome Measure Help needed turning from your back to your side while in a flat bed without using bedrails?: Total Help needed moving from lying on your back to sitting on the side of a flat bed without using bedrails?: Total Help needed moving to and from a bed to a chair (including a wheelchair)?: Total Help needed standing up from a chair using your arms (e.g., wheelchair or bedside chair)?: Total Help needed to walk in hospital room?: Total Help needed climbing 3-5 steps with a railing? : Total 6 Click Score: 6    End of Session   Activity Tolerance: Patient limited by fatigue Patient left: in chair;with call bell/phone within reach;with chair alarm set;with nursing/sitter in room Nurse Communication: Mobility status PT Visit Diagnosis: Unsteadiness on feet (R26.81)    Time:  2641-5830 PT Time Calculation (min) (ACUTE ONLY): 30 min   Charges:   PT Evaluation $PT Eval Low Complexity: 1 Low PT Treatments $Therapeutic Activity: 8-22 mins        [place] Pager [phone] Office [phone]   Claretha Cooper 03/18/2018, 1:40 PM

## 2018-03-18 NOTE — Progress Notes (Signed)
Rituxan increased to 200 mg/hr, VS stable. Will continue to monitor.

## 2018-03-18 NOTE — Progress Notes (Signed)
Rituxan increased to 400 mg/hr, VS stable. Will continue to monitor.

## 2018-03-18 NOTE — Progress Notes (Signed)
Nutrition Follow-up  DOCUMENTATION CODES:   Non-severe (moderate) malnutrition in context of chronic illness  INTERVENTION:  - Will order Ensure Enlive once/day PRN, this supplement provides 350 kcal and 20 grams of protein. - Continue to encourage PO intakes.    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 -ongoing  GOAL:   Patient will meet greater than or equal to 90% of their needs -beginning to meet  MONITOR:   PO intake, Supplement acceptance, Weight trends, Labs  ASSESSMENT:   84 y.o. female with medical history significant of Wegener's disease, chronic atrial fibrillation, dyslipidemia, hypothyroidism, and HTN. Her CT chest from June suggested possible progression of Wegener's disease. Patient has been feeling unwell for the last 10 days-postnasal drip, coughing, and occasional hemoptysis. Due to persistent symptoms she came to the hospital for further evaluation.  No new weight since admission. Updated estimated nutrition needs as patient extubated, and OGT removed, yesterday. SLP performed bedside swallow evaluation yesterday afternoon and cleared patient for Regular, thin liquids diet. Patient reports that she ate 100% of breakfast this AM which consisted of scrambled eggs, bacon, toast with jelly, and coffee. She denies any oral or throat pain with eating, denies any abdominal pain or nausea. Patient does not have any nutrition-related questions or concerns at this time.   Medications reviewed; sliding scale Novolog, 100 mcg oral Synthroid/day, 250 mg Solu-medrol QID. Labs reviewed; CBGs: 205 and 239 mg/dL today, K: 3.4 mmol/L, BUN: 71 mg/dL, creatinine: 2.23 mg/dL, Ca: 8.4 mg/dL, GFR: 20 mL/min.     Diet Order:   Diet Order            Diet regular Room service appropriate? Yes; Fluid consistency: Thin  Diet effective now              EDUCATION NEEDS:    No education needs have been identified at this time  Skin:  Skin Assessment: Reviewed RN Assessment  Last BM:  12/12  Height:   Ht Readings from Last 1 Encounters:  03/15/18 5' 6" (1.676 m)    Weight:   Wt Readings from Last 1 Encounters:  03/15/18 68.6 kg    Ideal Body Weight:  59.09 kg  BMI:  Body mass index is 24.41 kg/m.  Estimated Nutritional Needs:   Kcal:  1715-1920 kcal  Protein:  75-80 grams  Fluid:  >/= 1.7 L/day     Jarome Matin, MS, RD, LDN, [facility] Inpatient Clinical Dietitian Pager # [phone] After hours/weekend pager # [phone]

## 2018-03-19 ENCOUNTER — Inpatient Hospital Stay (HOSPITAL_COMMUNITY): Payer: Medicare Other

## 2018-03-19 LAB — URINALYSIS, ROUTINE W REFLEX MICROSCOPIC
Bilirubin Urine: NEGATIVE
Glucose, UA: 50 mg/dL — AB
Ketones, ur: NEGATIVE mg/dL
Leukocytes, UA: NEGATIVE
Nitrite: NEGATIVE
Protein, ur: 100 mg/dL — AB
SPECIFIC GRAVITY, URINE: 1.014 (ref 1.005–1.030)
pH: 5 (ref 5.0–8.0)

## 2018-03-19 LAB — CBC
HCT: 23.2 % — ABNORMAL LOW (ref 36.0–46.0)
Hemoglobin: 7.2 g/dL — ABNORMAL LOW (ref 12.0–15.0)
MCH: 30.5 pg (ref 26.0–34.0)
MCHC: 31 g/dL (ref 30.0–36.0)
MCV: 98.3 fL (ref 80.0–100.0)
Platelets: 286 10*3/uL (ref 150–400)
RBC: 2.36 MIL/uL — ABNORMAL LOW (ref 3.87–5.11)
RDW: 13.7 % (ref 11.5–15.5)
WBC: 7.5 10*3/uL (ref 4.0–10.5)
nRBC: 0.3 % — ABNORMAL HIGH (ref 0.0–0.2)

## 2018-03-19 LAB — BASIC METABOLIC PANEL
Anion gap: 12 (ref 5–15)
BUN: 73 mg/dL — ABNORMAL HIGH (ref 8–23)
CO2: 24 mmol/L (ref 22–32)
Calcium: 8.5 mg/dL — ABNORMAL LOW (ref 8.9–10.3)
Chloride: 101 mmol/L (ref 98–111)
Creatinine, Ser: 2.43 mg/dL — ABNORMAL HIGH (ref 0.44–1.00)
GFR calc Af Amer: 21 mL/min — ABNORMAL LOW (ref >60)
GFR calc non Af Amer: 18 mL/min — ABNORMAL LOW (ref >60)
Glucose, Bld: 213 mg/dL — ABNORMAL HIGH (ref 70–99)
Potassium: 3.3 mmol/L — ABNORMAL LOW (ref 3.5–5.1)
Sodium: 137 mmol/L (ref 135–145)

## 2018-03-19 LAB — GLUCOSE, CAPILLARY
GLUCOSE-CAPILLARY: 256 mg/dL — AB (ref 70–99)
Glucose-Capillary: 181 mg/dL — ABNORMAL HIGH (ref 70–99)
Glucose-Capillary: 185 mg/dL — ABNORMAL HIGH (ref 70–99)
Glucose-Capillary: 201 mg/dL — ABNORMAL HIGH (ref 70–99)
Glucose-Capillary: 258 mg/dL — ABNORMAL HIGH (ref 70–99)
Glucose-Capillary: 360 mg/dL — ABNORMAL HIGH (ref 70–99)

## 2018-03-19 LAB — HEPATITIS B SURFACE ANTIBODY, QUANTITATIVE: Hep B S AB Quant (Post): <3.1 m[IU]/mL — ABNORMAL LOW (ref >9.9)

## 2018-03-19 LAB — CULTURE, RESPIRATORY W GRAM STAIN: Culture: NO GROWTH

## 2018-03-19 LAB — HEPATITIS B SURFACE ANTIGEN: Hepatitis B Surface Ag: NEGATIVE

## 2018-03-19 MED ORDER — FAMOTIDINE 20 MG PO TABS
20.0000 mg | ORAL_TABLET | Freq: Two times a day (BID) | ORAL | Status: DC
  Administered 2018-03-19 – 2018-03-20 (×3): 20 mg via ORAL
  Filled 2018-03-19 (×3): qty 1

## 2018-03-19 MED ORDER — SODIUM CHLORIDE 0.9 % IV SOLN
INTRAVENOUS | Status: AC
  Administered 2018-03-19: 10:00:00 via INTRAVENOUS

## 2018-03-19 MED ORDER — POTASSIUM CHLORIDE CRYS ER 10 MEQ PO TBCR
10.0000 meq | EXTENDED_RELEASE_TABLET | Freq: Once | ORAL | Status: DC
  Filled 2018-03-19: qty 1

## 2018-03-19 MED ORDER — PREDNISONE 20 MG PO TABS
60.0000 mg | ORAL_TABLET | Freq: Every day | ORAL | Status: DC
  Administered 2018-03-19 – 2018-03-21 (×3): 60 mg via ORAL
  Filled 2018-03-19 (×3): qty 3

## 2018-03-19 MED ORDER — AMLODIPINE BESYLATE 5 MG PO TABS
5.0000 mg | ORAL_TABLET | Freq: Every day | ORAL | Status: DC
  Administered 2018-03-19 – 2018-03-20 (×2): 5 mg via ORAL
  Filled 2018-03-19 (×2): qty 1

## 2018-03-19 MED ORDER — POTASSIUM CHLORIDE CRYS ER 10 MEQ PO TBCR
10.0000 meq | EXTENDED_RELEASE_TABLET | Freq: Once | ORAL | Status: AC
  Administered 2018-03-19: 10 meq via ORAL
  Filled 2018-03-19: qty 1

## 2018-03-19 MED ORDER — GABAPENTIN 100 MG PO CAPS
100.0000 mg | ORAL_CAPSULE | Freq: Two times a day (BID) | ORAL | Status: DC
  Administered 2018-03-19 – 2018-03-21 (×5): 100 mg via ORAL
  Filled 2018-03-19 (×5): qty 1

## 2018-03-19 NOTE — Progress Notes (Signed)
Inpatient Diabetes Program Recommendations  AACE/ADA: New Consensus Statement on Inpatient Glycemic Control (2015)  Target Ranges:  Prepandial:   less than 140 mg/dL      Peak postprandial:   less than 180 mg/dL (1-2 hours)      Critically ill patients:  140 - 180 mg/dL   Lab Results  Component Value Date   GLUCAP 181 (H) 03/19/2018   HGBA1C 6.3 12/22/2017    Review of Glycemic Control  Diabetes history: DM2 Outpatient Diabetes medications: Amaryl 2 mg QAM Current orders for Inpatient glycemic control: Novolog 0-15 units Q4H  Inpatient Diabetes Program Recommendations:     Need updated HgbA1C. Last one 12/22/2017 - 6.3%. Add Novolog 3 units tidwc for meal coverage insulin if pt eats > 50% meal.  Will continue to follow.  Thank you. Lorenda Peck, RD, LDN, CDE Inpatient Diabetes Coordinator [phone]

## 2018-03-19 NOTE — Progress Notes (Addendum)
NAME:  [name] [name], MRN:  [id number], DOB:  November 16, 1932, LOS: 4 ADMISSION DATE:  03/15/2018, CONSULTATION DATE:  03/15/2018 REFERRING MD:  Cathlean Sauer, CHIEF COMPLAINT:  Dyspnea, hemoptysis  Brief History   84 year old female with a past medical history significant for small vessel vasculitis based on lab work (on/off prednisone for at least 6 years), carcinoid tumor of the lung, breast cancer who has been followed by Dr. Annamaria Boots who presented with shortness of breath and hemoptysis.  2 weeks prior to admit she had a severe headache and episode of epistaxis.  Admitted 12/9 per [facility] for evaluation of SOB.  HRCT showed diffuse bilateral airspace disease, patchy consolidation, air bronchogram, GGO and tracheobronchomalacia.  She developed worsening respiratory distress and required intubation.    Past Medical History  Small vessel vasculitis, proteinase 3 antibody positive, pulmonary hypertension, carcinoid tumor, diabetes mellitus type 2, stroke, A. fib  Significant Hospital Events   12/09  Admit with SOB, acute onset hemoptysis > intubated late pm 12/10  Remains on vent, PEEP5 / FiO2 30%. FOB with diffuse bloody secretions, no active bleed 12/11  Extubated 12/12  Completed IV high dose steroids  12/13  Transitioned to oral prednisone   Consults:  12/9 PCCM  Procedures:  ETT 12/9 >> 12/11   Significant Diagnostic Tests:  12/9 CXR >> consolidation in the left upper lobe as well as the right upper lobe, diffuse bilateral airspace disease  HRCT 12/9 >> appearance of lungs most compatible with severe multilobar bronchopneumonia, diffuse alveolar hemorrhage could also be considered, cardiomegaly with biatrial dilatation, tracheobronchomalacia, 2.2cm low-attenuation lesion in the tail of the pancreas similar dating back to 2015, hepatic steatosis ANCA Titers 12/9 >> C-ANCA 1:640, P-ANCA <1:20 ANCA Proteinase 3 12/9 >> greater than 100  Micro Data:  BCx2 12/9 >>  U. Strep 12/10 >> negative  UA 12/9  >> moderate Hgb, 30 protein, 21-50 RBC Tracheal aspirate 12/10 >> negative  BAL 12/10 >>  Fungal (BAL)12/10 >> negative  AFB (BAL) 12/10 >> negative smear   Antimicrobials:  Ceftriaxone 12/9 >>  Azithromycin 12/9 >>  Interim history/subjective:  Tolerated rituxan.  Renal function rising >  I/O- net neg 2.9L since admit. Pt reports feeling well.  O2 at 2L.  Denies further hemoptysis.   Objective   Blood pressure (!) 157/90, pulse 74, temperature 98.3 F (36.8 C), temperature source Oral, resp. rate (!) 22, height 5' 6" (1.676 m), weight 68.6 kg, SpO2 98 %.        Intake/Output Summary (Last 24 hours) at 03/19/2018 0810 Last data filed at 03/19/2018 0400 Gross per 24 hour  Intake 204 ml  Output 400 ml  Net -196 ml   Filed Weights   03/15/18 0905 03/15/18 1500  Weight: 70.7 kg 68.6 kg    Examination: General: elderly female lying in bed in NAD HEENT: MM pink/moist, no jvd Neuro: Pleasant, awake, alert, MAE, appropriate, mild confusion but easily reoriented  CV: s1s2 irr, no m/r/g PULM: even/non-labored, lungs bilaterally clear anterior, crackles posterior  WU:JWJX, non-tender, bsx4 active  Extremities: warm/dry, no edema  Skin: no rashes or lesions  Resolved Hospital Problem list     Assessment & Plan:   Acute respiratory failure with hypoxemia: -empiric Rx for CAP but course consistent with small vessel vasculitis -baseline 2L O2 dependent at night  P: Transition to Prednisone 60 mg QD.  Plan to stay on 60 mg until discharge and then reduce to 40 mg and stay.  Taper prednisone to 20 mg over  2 months.   Next dose rituximab due 12/19  If further decompensation (worsening bleeding, rising renal function) consider IVIG O2 to support sats > 90%.  Follow intermittent CXR  D5/5 abx  Will need to arrange for outpt rituximab for 12/19 once discharged.  Appt for 12/16 at Pulmonary arranged with NP in the event she discharges on Sunday. Derl Barrow, NP at 12/16 1000  am).  Afib P: Tele monitoring  SDU status   HTN P: Continue bystolic  Resume norvasc   Hemoptysis P: Steroids as above   Acute on chronic kidney failure: worrisome for small vessel vasculitis -UA with moderate Hgb, 30 protein, 21-50 RBC -baseline sr cr 1.6-1.9, Nephrology does not feel renal involvement at this time  P: NS at 28m/hr x 1 L Repeat UA with rising sr cr > note prior blood + protein in urine Trend BMP / urinary output Replace electrolytes as indicated Avoid nephrotoxic agents, ensure adequate renal perfusion Home lasix on hold since 12/12  Nephrology previously evaluated, may need to call back if renal function continues to worsen  At Risk Malnutrition  P: Diet as tolerated   Hypothyroidism  P: Synthroid   History of carcinoid tumor History of breast cancer P: Outpatient follow up   Deconditioning  P: PT consult > home vs short term SNF  PT recommending SNF, consult SW in the event family wants short term rehab option  Best practice:  Diet: Regular  Pain/Anxiety/Delirium protocol (if indicated): n/a VAP protocol (if indicated): n/a DVT prophylaxis: SCD GI prophylaxis: pepcid  Glucose control:  Mobility: bed rest  Code Status: full code Family Communication: No family available 12/13.  Patients son's very involved in her care.  ? If she will need a SNF at discharge vs home with PT.   Disposition: SDU.  To [facility] as primary 12/13.    Labs   CBC: Recent Labs  Lab 03/15/18 0913 03/16/18 0241 03/17/18 0303 03/18/18 0257 03/19/18 0316  WBC 8.6 13.1* 6.6 9.8 7.5  NEUTROABS 6.6  --   --   --   --   HGB 7.2* 7.8* 7.6* 7.1* 7.2*  HCT 22.8* 25.3* 23.9* 22.6* 23.2*  MCV 97.9 101.2* 98.0 99.1 98.3  PLT 210 197 244 284 2222   Basic Metabolic Panel: Recent Labs  Lab 03/15/18 0913 03/16/18 0241 03/17/18 0303 03/18/18 0257 03/19/18 0316  NA 132* 134* 136 136 137  K 4.1 4.7 3.8 3.4* 3.3*  CL 99 99 100 101 101  CO2 23 21* 21* 23 24   GLUCOSE 150* 203* 276* 207* 213*  BUN 36* 37* 60* 71* 73*  CREATININE 2.04* 1.97* 2.16* 2.23* 2.43*  CALCIUM 8.1* 8.3* 8.3* 8.4* 8.5*  MG  --   --  2.2  --   --   PHOS  --   --  4.5  --   --    GFR: Estimated Creatinine Clearance: 16.4 mL/min (A) (by C-G formula based on SCr of 2.43 mg/dL (H)). Recent Labs  Lab 03/15/18 0913 03/16/18 0241 03/17/18 0303 03/18/18 0257 03/19/18 0316  WBC 8.6 13.1* 6.6 9.8 7.5  LATICACIDVEN 1.04  --   --   --   --     Liver Function Tests: Recent Labs  Lab 03/15/18 0913 03/17/18 0303  AST 16 15  ALT 10 9  ALKPHOS 55 48  BILITOT 1.3* 0.8  PROT 6.7 7.1  ALBUMIN 3.5 3.1*   No results for input(s): LIPASE, AMYLASE in the last 168 hours. No  results for input(s): AMMONIA in the last 168 hours.  ABG    Component Value Date/Time   PHART 7.330 (L) 03/15/2018 1938   PCO2ART 48.6 (H) 03/15/2018 1938   PO2ART 353 (H) 03/15/2018 1938   HCO3 24.4 03/15/2018 1938   TCO2 23 09/13/2017 2030   ACIDBASEDEF 0.8 03/15/2018 1938   O2SAT 99.5 03/15/2018 1938     Coagulation Profile: Recent Labs  Lab 03/15/18 0913  INR 1.49    Cardiac Enzymes: No results for input(s): CKTOTAL, CKMB, CKMBINDEX, TROPONINI in the last 168 hours.  HbA1C: Hgb A1c MFr Bld  Date/Time Value Ref Range Status  12/22/2017 03:28 PM 6.3 4.6 - 6.5 % Final    Comment:    Glycemic Control Guidelines for People with Diabetes:Non Diabetic:  <6%Goal of Therapy: <7%Additional Action Suggested:  >8%   09/23/2017 12:01 PM 6.8 (H) 4.6 - 6.5 % Final    Comment:    Glycemic Control Guidelines for People with Diabetes:Non Diabetic:  <6%Goal of Therapy: <7%Additional Action Suggested:  >8%     CBG: Recent Labs  Lab 03/18/18 1619 03/18/18 2013 03/18/18 2342 03/19/18 0327 03/19/18 0749  GLUCAP 205* 281* [place], NP-C [facility] Pgr: [phone] or if no answer [phone] 03/19/2018, 8:10 AM

## 2018-03-19 NOTE — Evaluation (Signed)
Occupational Therapy Evaluation Patient Details Name: [name] [name] MRN: [id number] DOB: 12-Dec-1932 Today's Date: 03/19/2018    History of Present Illness 84 year old female with a past medical history significant for small vessel vasculitis , carcinoid tumor of the lung, breast cancer  presented 03/15/18  with shortness of breath and hemoptysis.  CT  showed diffuse bilateral airspace disease, patchy consolidation, and tracheobronchomalacia.  She developed worsening respiratory distress and required intubation. extubated 12/11.   Clinical Impression   Pt admitted with pneumonia. Pt currently with functional limitations due to the deficits listed below (see OT Problem List).  Pt will benefit from skilled OT to increase their safety and independence with ADL and functional mobility for ADL to facilitate discharge to venue listed below. Pts husband with dementia but is able to care for himself but not able to help her.     Follow Up Recommendations  Home health OT;Supervision - Intermittent    Equipment Recommendations  None recommended by OT    Recommendations for Other Services       Precautions / Restrictions Precautions Precautions: Fall Precaution Comments: reports h/o vertigo/dizziness      Mobility Bed Mobility               General bed mobility comments: pt in chair  Transfers Overall transfer level: Needs assistance Equipment used: Rolling walker (2 wheeled) Transfers: Sit to/from [facility] Sit to Stand: Min assist Stand pivot transfers: Min assist       General transfer comment: pt walked to bathroom with OT. Pt needed to sit on toilet.  Pt left pt on toilet with RN in the room    Balance Overall balance assessment: Mild deficits observed, not formally tested Sitting-balance support: Feet supported;Bilateral upper extremity supported Sitting balance-Leahy Scale: Poor   Postural control: Posterior lean Standing balance support: During  functional activity;Bilateral upper extremity supported Standing balance-Leahy Scale: Zero Standing balance comment: posterior lean significant                           ADL either performed or assessed with clinical judgement   ADL Overall ADL's : Needs assistance/impaired Eating/Feeding: Set up;Sitting   Grooming: Set up;Sitting   Upper Body Bathing: Set up;Sitting   Lower Body Bathing: Minimal assistance;Sit to/from stand;Cueing for sequencing;Cueing for safety   Upper Body Dressing : Set up;Sitting   Lower Body Dressing: Sit to/from stand;Cueing for sequencing;Cueing for safety;Minimal assistance   Toilet Transfer: Minimal assistance;RW;Comfort height toilet   Toileting- Clothing Manipulation and Hygiene: Minimal assistance;Sit to/from stand         General ADL Comments: pt very motivated to participate with OT                  Pertinent Vitals/Pain Pain Assessment: No/denies pain        Extremity/Trunk Assessment         Cervical / Trunk Assessment Cervical / Trunk Assessment: Kyphotic   Communication Communication Communication: No difficulties   Cognition Arousal/Alertness: Awake/alert Behavior During Therapy: WFL for tasks assessed/performed Overall Cognitive Status: Within Functional Limits for tasks assessed                                                Home Living Family/patient expects to be discharged to:: Private residence Living Arrangements: Spouse/significant other Available Help  at Discharge: Family Type of Home: House Home Access: Stairs to enter [employer] of Steps: 3 Entrance Stairs-Rails: [place]: One level               Home Equipment: Environmental consultant - 2 wheels;Walker - 4 wheels;Grab bars - toilet;Grab bars - tub/shower   Additional Comments: spouse with dementia, 3 sons close and assist      Prior Functioning/Environment Level of Independence: Independent with  assistive device(s)                 OT Problem List: Decreased strength;Decreased activity tolerance;Impaired balance (sitting and/or standing)      OT Treatment/Interventions: Self-care/ADL training;Patient/family education;DME and/or AE instruction;Therapeutic activities    OT Goals(Current goals can be found in the care plan section) Acute Rehab OT Goals Patient Stated Goal: to go home OT Goal Formulation: With patient Time For Goal Achievement: 04/02/18 Potential to Achieve Goals: Good  OT Frequency: Min 2X/week              AM-PAC OT "6 Clicks" Daily Activity     Outcome Measure Help from another person eating meals?: None Help from another person taking care of personal grooming?: A Little Help from another person toileting, which includes using toliet, bedpan, or urinal?: A Little Help from another person bathing (including washing, rinsing, drying)?: A Little Help from another person to put on and taking off regular upper body clothing?: A Little Help from another person to put on and taking off regular lower body clothing?: A Little 6 Click Score: 19   End of Session Equipment Utilized During Treatment: Rolling walker;Gait belt Nurse Communication: Mobility status  Activity Tolerance: Patient tolerated treatment well Patient left: Other (comment)(in bathroom with RN present)  OT Visit Diagnosis: Unsteadiness on feet (R26.81);Muscle weakness (generalized) (M62.81)                Time: 2608-8835 OT Time Calculation (min): 25 min Charges:  OT General Charges $OT Visit: 1 Visit OT Evaluation $OT Eval Moderate Complexity: 1 Mod OT Treatments $Self Care/Home Management : 8-22 mins  Kari Baars, OT Acute Rehabilitation Services [phone] Office- [phone]     [place], Edwena Felty D 03/19/2018, 1:45 PM

## 2018-03-20 DIAGNOSIS — I5032 Chronic diastolic (congestive) heart failure: Secondary | ICD-10-CM

## 2018-03-20 DIAGNOSIS — E1149 Type 2 diabetes mellitus with other diabetic neurological complication: Secondary | ICD-10-CM

## 2018-03-20 DIAGNOSIS — N184 Chronic kidney disease, stage 4 (severe): Secondary | ICD-10-CM

## 2018-03-20 DIAGNOSIS — N179 Acute kidney failure, unspecified: Secondary | ICD-10-CM

## 2018-03-20 LAB — CBC
HCT: 22.9 % — ABNORMAL LOW (ref 36.0–46.0)
Hemoglobin: 7 g/dL — ABNORMAL LOW (ref 12.0–15.0)
MCH: 29.9 pg (ref 26.0–34.0)
MCHC: 30.6 g/dL (ref 30.0–36.0)
MCV: 97.9 fL (ref 80.0–100.0)
Platelets: 267 10*3/uL (ref 150–400)
RBC: 2.34 MIL/uL — AB (ref 3.87–5.11)
RDW: 13.4 % (ref 11.5–15.5)
WBC: 7 10*3/uL (ref 4.0–10.5)
nRBC: 0.3 % — ABNORMAL HIGH (ref 0.0–0.2)

## 2018-03-20 LAB — BASIC METABOLIC PANEL
Anion gap: 12 (ref 5–15)
BUN: 74 mg/dL — ABNORMAL HIGH (ref 8–23)
CO2: 22 mmol/L (ref 22–32)
Calcium: 8.7 mg/dL — ABNORMAL LOW (ref 8.9–10.3)
Chloride: 105 mmol/L (ref 98–111)
Creatinine, Ser: 2 mg/dL — ABNORMAL HIGH (ref 0.44–1.00)
GFR calc Af Amer: 26 mL/min — ABNORMAL LOW (ref >60)
GFR calc non Af Amer: 23 mL/min — ABNORMAL LOW (ref >60)
Glucose, Bld: 128 mg/dL — ABNORMAL HIGH (ref 70–99)
Potassium: 3.2 mmol/L — ABNORMAL LOW (ref 3.5–5.1)
SODIUM: 139 mmol/L (ref 135–145)

## 2018-03-20 LAB — GLUCOSE, CAPILLARY
Glucose-Capillary: 100 mg/dL — ABNORMAL HIGH (ref 70–99)
Glucose-Capillary: 153 mg/dL — ABNORMAL HIGH (ref 70–99)
Glucose-Capillary: 179 mg/dL — ABNORMAL HIGH (ref 70–99)
Glucose-Capillary: 230 mg/dL — ABNORMAL HIGH (ref 70–99)
Glucose-Capillary: 292 mg/dL — ABNORMAL HIGH (ref 70–99)

## 2018-03-20 LAB — MAGNESIUM: Magnesium: 2.5 mg/dL — ABNORMAL HIGH (ref 1.7–2.4)

## 2018-03-20 LAB — CULTURE, BLOOD (ROUTINE X 2)
CULTURE: NO GROWTH
Culture: NO GROWTH
Special Requests: ADEQUATE

## 2018-03-20 MED ORDER — INSULIN GLARGINE 100 UNITS/ML SOLOSTAR PEN
5.0000 [IU] | PEN_INJECTOR | Freq: Every day | SUBCUTANEOUS | Status: DC
  Filled 2018-03-20: qty 3

## 2018-03-20 MED ORDER — INSULIN GLARGINE 100 UNIT/ML ~~LOC~~ SOLN
5.0000 [IU] | Freq: Every day | SUBCUTANEOUS | Status: DC
  Administered 2018-03-20: 5 [IU] via SUBCUTANEOUS
  Filled 2018-03-20 (×2): qty 0.05

## 2018-03-20 MED ORDER — POTASSIUM CHLORIDE 20 MEQ PO PACK
40.0000 meq | PACK | Freq: Once | ORAL | Status: AC
  Administered 2018-03-20: 40 meq via ORAL
  Filled 2018-03-20: qty 2

## 2018-03-20 MED ORDER — FAMOTIDINE 20 MG PO TABS
10.0000 mg | ORAL_TABLET | Freq: Two times a day (BID) | ORAL | Status: DC
  Administered 2018-03-20 – 2018-03-21 (×2): 10 mg via ORAL
  Filled 2018-03-20 (×2): qty 1

## 2018-03-20 MED ORDER — INSULIN ASPART 100 UNIT/ML ~~LOC~~ SOLN
0.0000 [IU] | Freq: Three times a day (TID) | SUBCUTANEOUS | Status: DC
  Administered 2018-03-20: 3 [IU] via SUBCUTANEOUS
  Administered 2018-03-20: 5 [IU] via SUBCUTANEOUS
  Administered 2018-03-20: 8 [IU] via SUBCUTANEOUS
  Administered 2018-03-21: 2 [IU] via SUBCUTANEOUS

## 2018-03-20 NOTE — Progress Notes (Addendum)
PROGRESS NOTE    [name] [name]  TOI:[id number] DOB: Aug 26, 1932 DOA: 03/15/2018 PCP: [name], MD/pulmonologist: Dr. Young/cardiologist: Dr. 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 Annamaria Boots who presented with shortness of breath and hemoptysis.  2 weeks prior to admit she had a severe headache and episode of epistaxis.  Admitted 12/9 per [facility] for evaluation of SOB.  HRCT showed diffuse bilateral airspace disease, patchy consolidation, air bronchogram, GGO and tracheobronchomalacia.  She developed worsening respiratory distress and required intubation.    Assessment & Plan:   Principal Problem:   Acute respiratory failure with hypoxemia (HCC) Active Problems:   Wegener's granulomatosis (granulomatosis with polyangiitis) (HCC)   Essential hypertension   Diastolic CHF, chronic (HCC) - on low dose Lasix. ARB & BB along with Norvasc   Chronic atrial fibrillation (HCC) CHADS2-VASc=6.  On Corrected "low" dose of Eliquis   Type 2 diabetes mellitus with neurological complications (HCC)   Pneumonia   Acute renal failure superimposed on stage 4 chronic kidney disease (HCC)  #1 acute respiratory failure with hypoxemia likely secondary to small vessel vasculitis/Wegener's granulomatosis (granulomatosis with polyangiitis) Patient admitted with acute respiratory failure with hypoxemia and presented with hemoptysis chest x-ray initially concerning for multilobar pneumonia.  Patient noted to have a prior history of Wegener's diagnosed in 2014 and at that time also had some renal insufficiency.  Patient clinically improved, speaking in full sentence with good sats on room air.  Urinalysis during this hospitalization showing proteinuria and 21-50 RBCs.  Blood cultures with no growth to date.  Tracheal  aspirate from 03/16/2018 was negative.  Fungal BAL negative.  ANCA titers from 03/15/2018 with C-ANCA 1:640, P-ANCA <1:20, ANCA proteinase 3 from 03/15/2018 was greater than 100.  Patient had presented with hemoptysis.  Patient initially treated empirically on IV antibiotics had to be intubated and now extubated.  Patient had received high-dose IV pulse steroids as well as rituximab given on 03/18/2018 which she will need weekly for total of 4 weeks.  Patient transition to prednisone 60 mg daily per pulmonary and critical care and then subsequently down to 40 mg on discharge with a tapered down for 4 to 74month to 20 ng daily with close outpatient follow-up with pulmonary.  Antibiotics have been discontinued and monitor off antibiotics.  Follow.  2.  Acute renal failure on chronic kidney disease stage IV (baseline creatinine 1.7-2.1) Felt likely secondary to aggressive diuresis early on during the hospitalization versus secondary to granulomatosis with polyangiitis.  Urinalysis did show evidence of glomerulonephritis with persistent proteinuria and RBCs.  Patient's diuretics on hold.  Patient hydrated with IV fluids and creatinine down to 2.0 from 2.43 from 2.23 from 1.97.  Renal function now currently at baseline today.  Repeat labs again tomorrow.  Outpatient follow-up.  3.  Hypothyroidism Continue home dose Synthroid.  Outpatient follow-up with PCP.  4.  Diabetes mellitus Hemoglobin A1c was 6.3 on 12/22/2017.  CBG this morning is 100.  Patient on prednisone and a/monitor CBGs closely.  [facility].  Continue sliding scale insulin.  Follow.  5.  History of carcinoid tumor/history of breast cancer Outpatient follow-up.  6 chronic atrial fibrillation ( CHA2DS2VASC =6) Currently rate controlled on Bystolic.  Eliquis on hold secondary to problem #1 as patient had presented with hemoptysis.  Will defer resumption of anticoagulation to PCP/pulmonary on follow-up.  7.  Chronic diastolic  heart failure Currently stable.  Diuretics on hold.  Likely resume home regimen of Lasix on discharge.  8 hypertension Continue Bystolic and Norvasc.  9.  Debility/deconditioning Patient initially assessed by PT who was recommending SNF.  Patient however seems to be a little bit stronger than she was on initial assessment by PT.  Patient wants to go home states she can get 24-hour supervision and help at home and asking whether she can get home health therapies.  Will have PT reassess.  Hopefully patient may be able to go home with home health therapies.    DVT prophylaxis: SCDs Code Status: Full Family Communication: Updated patient.  No family at bedside. Disposition Plan: Home with home health versus skilled nursing facility pending PT reassessment.  Significant Diagnostic Tests:  12/9 CXR >> consolidation in the left upper lobe as well as the right upper lobe, diffuse bilateral airspace disease  HRCT 12/9 >> appearance of lungs most compatible with severe multilobar bronchopneumonia, diffuse alveolar hemorrhage could also be considered, cardiomegaly with biatrial dilatation, tracheobronchomalacia, 2.2cm low-attenuation lesion in the tail of the pancreas similar dating back to 2015, hepatic steatosis ANCA Titers 12/9 >> C-ANCA 1:640, P-ANCA <1:20 ANCA Proteinase 3 12/9 >> greater than 100   Micro Data:  BCx2 12/9 >>  U. Strep 12/10 >> negative  UA 12/9 >> moderate Hgb, 30 protein, 21-50 RBC Tracheal aspirate 12/10 >> negative  BAL 12/10 >>  Fungal (BAL)12/10 >> negative  AFB (BAL) 12/10 >> negative smear     Significant Hospital Events   12/09  Admit with SOB, acute onset hemoptysis > intubated late pm 12/10  Remains on vent, PEEP5 / FiO2 30%. FOB with diffuse bloody secretions, no active bleed 12/11  Extubated 12/12  Completed IV high dose steroids  12/13  Transitioned to oral prednisone      Consultants:   Nephrology: Dr.Schertz 03/16/2018  Pulmonary and critical  care: Dr. [place] Bells 03/15/2018  Procedures:  ETT 12/9 >> 12/11 CT chest high-resolution 03/15/2018 Chest x-ray 03/15/2008 19, 03/16/2018, 03/17/2018, 03/18/2018, 03/19/2018  Antimicrobials:  IV/PO azithromycin 03/15/2018>>>> 03/19/2018  IV Rocephin 03/15/2018>>>>> 03/19/2018   Subjective: Sitting up in bed eating breakfast.  Denies any chest pain.  Denies any shortness of breath.  Denies any hemoptysis this morning.  Patient stated had an episode of hemoptysis yesterday morning but none since.  States has good urine output.  Asking whether she can go home.  Objective: Vitals:   03/19/18 2313 03/20/18 0010 03/20/18 0358 03/20/18 0800  BP: (!) 167/107   (!) 156/90  Pulse:    96  Resp: 17   (!) 21  Temp:  97.6 F (36.4 C) 97.6 F (36.4 C) 98.1 F (36.7 C)  TempSrc:  Oral Oral Oral  SpO2:    97%  Weight:      Height:        Intake/Output Summary (Last 24 hours) at 03/20/2018 0956 Last data filed at 03/19/2018 2222 Gross per 24 hour  Intake 1377.24 ml  Output 400 ml  Net 977.24 ml   Filed Weights   03/15/18 0905 03/15/18 1500  Weight: 70.7 kg 68.6 kg    Examination:  General exam: Appears calm and comfortable  Respiratory system: Bibasilar crackles.  No rhonchi.  No wheezing.  Speaking in full sentences.  No use of accessory muscles of respiration.  Cardiovascular system: Irregularly irregular.  No JVD, no murmurs, no rubs, no gallops.  No lower extremity edema.  Gastrointestinal system: Abdomen is nondistended, soft and nontender. No organomegaly or masses felt. Normal bowel sounds heard. Central nervous system: Alert and oriented. No focal neurological deficits. Extremities: Symmetric 5 x 5 power. Skin: No rashes, lesions or ulcers Psychiatry: Judgement and insight appear normal. Mood & affect appropriate.     Data Reviewed: I have personally reviewed following labs and imaging studies  CBC: Recent Labs  Lab 03/15/18 0913 03/16/18 0241 03/17/18 0303  03/18/18 0257 03/19/18 0316 03/20/18 0240  WBC 8.6 13.1* 6.6 9.8 7.5 7.0  NEUTROABS 6.6  --   --   --   --   --   HGB 7.2* 7.8* 7.6* 7.1* 7.2* 7.0*  HCT 22.8* 25.3* 23.9* 22.6* 23.2* 22.9*  MCV 97.9 101.2* 98.0 99.1 98.3 97.9  PLT 210 197 244 284 286 829   Basic Metabolic Panel: Recent Labs  Lab 03/16/18 0241 03/17/18 0303 03/18/18 0257 03/19/18 0316 03/20/18 0235 03/20/18 0240  NA 134* 136 136 137  --  139  K 4.7 3.8 3.4* 3.3*  --  3.2*  CL 99 100 101 101  --  105  CO2 21* 21* 23 24  --  22  GLUCOSE 203* 276* 207* 213*  --  128*  BUN 37* 60* 71* 73*  --  74*  CREATININE 1.97* 2.16* 2.23* 2.43*  --  2.00*  CALCIUM 8.3* 8.3* 8.4* 8.5*  --  8.7*  MG  --  2.2  --   --  2.5*  --   PHOS  --  4.5  --   --   --   --    GFR: Estimated Creatinine Clearance: 20 mL/min (A) (by C-G formula based on SCr of 2 mg/dL (H)). Liver Function Tests: Recent Labs  Lab 03/15/18 0913 03/17/18 0303  AST 16 15  ALT 10 9  ALKPHOS 55 48  BILITOT 1.3* 0.8  PROT 6.7 7.1  ALBUMIN 3.5 3.1*   No results for input(s): LIPASE, AMYLASE in the last 168 hours. No results for input(s): AMMONIA in the last 168 hours. Coagulation Profile: Recent Labs  Lab 03/15/18 0913  INR 1.49   Cardiac Enzymes: No results for input(s): CKTOTAL, CKMB, CKMBINDEX, TROPONINI in the last 168 hours. BNP (last 3 results) No results for input(s): PROBNP in the last 8760 hours. HbA1C: No results for input(s): HGBA1C in the last 72 hours. CBG: Recent Labs  Lab 03/19/18 1556 03/19/18 1938 03/19/18 2353 03/20/18 0311 03/20/18 0819  GLUCAP 360* 256* 185* 100* 153*   Lipid Profile: No results for input(s): CHOL, HDL, LDLCALC, TRIG, CHOLHDL, LDLDIRECT in the last 72 hours. Thyroid Function Tests: No results for input(s): TSH, T4TOTAL, FREET4, T3FREE, THYROIDAB in the last 72 hours. Anemia Panel: No results for input(s): VITAMINB12, FOLATE, FERRITIN, TIBC, IRON, RETICCTPCT in the last 72 hours. Sepsis  Labs: Recent Labs  Lab 03/15/18 0913  LATICACIDVEN 1.04    Recent Results (from the past 240 hour(s))  Culture, blood (Routine x 2)     Status: None (Preliminary result)   Collection Time: 03/15/18  9:06 AM  Result Value Ref Range Status   Specimen Description   Final    BLOOD BLOOD LEFT FOREARM Performed at [facility], [place] [address] [place], [place] [address]    Special Requests   Final    BOTTLES DRAWN AEROBIC AND ANAEROBIC Blood Culture results may not be optimal due to an excessive volume of blood received in culture bottles Performed at [facility]  [place] [address], [place], [place] [address]    Culture   Final    NO GROWTH 4 DAYS Performed at [facility], [place] [address] [place], [place] [address]    Report Status PENDING  Incomplete  Culture, blood (Routine x 2)     Status: None (Preliminary result)   Collection Time: 03/15/18  9:13 AM  Result Value Ref Range Status   Specimen Description   Final    BLOOD BLOOD RIGHT FOREARM Performed at [place] [address], [place], [place] [address]    Special Requests   Final    BOTTLES DRAWN AEROBIC AND ANAEROBIC Blood Culture adequate volume Performed at [facility] [address] [place], [place] [address]    Culture   Final    NO GROWTH 4 DAYS Performed at [facility], [place] [address], [place], [place] [address]    Report Status PENDING  Incomplete  MRSA PCR Screening     Status: None   Collection Time: 03/15/18  5:27 PM  Result Value Ref Range Status   MRSA by PCR NEGATIVE NEGATIVE Final    Comment:        The GeneXpert MRSA Assay (FDA approved for NASAL specimens only), is one component of a comprehensive MRSA colonization surveillance program. It is not intended to diagnose MRSA infection nor to guide or monitor treatment for MRSA infections. Performed at [facility], [place] [address],  [place], [place] [address]   Culture, respiratory (non-expectorated)     Status: None   Collection Time: 03/16/18  8:38 AM  Result Value Ref Range Status   Specimen Description   Final    TRACHEAL ASPIRATE Performed at [place] [address] [place], [place] [address]    Special Requests   Final    NONE Performed at [facility], [place] [address] [place], [place] [address]    Gram Stain NO WBC SEEN NO ORGANISMS SEEN   Final   Culture   Final    NO GROWTH 2 DAYS Performed at [facility], [place] [address] [place], [place] [address]    Report Status 03/18/2018 FINAL  Final  Fungus Culture With Stain     Status: None (Preliminary result)   Collection Time: 03/16/18  5:08 PM  Result Value Ref Range Status   Fungus Stain Final report  Final    Comment: (NOTE) Performed At: [facility] [place], [place] [id number] Rush Farmer MD LZ:[id number]    Fungus (Mycology) Culture PENDING  Incomplete   Fungal Source BRONCHIAL ALVEOLAR LAVAGE  Final    Comment: Performed at [facility], [place] [address], [place], [place] [address]  Acid Fast Smear (AFB)     Status: None   Collection Time: 03/16/18  5:08 PM  Result Value Ref Range Status   AFB Specimen Processing Concentration  Final   Acid Fast Smear Negative  Final    Comment: (NOTE) Performed At: [facility] [place], [place] [id number] Rush Farmer MD QA:[id number]    Source (AFB) BRONCHIAL ALVEOLAR LAVAGE  Final    Comment: Performed at [facility], [place] [address] [place], [place] [address]  Culture, respiratory     Status: None   Collection Time: 03/16/18  5:08 PM  Result Value Ref Range Status   Specimen Description   Final    BRONCHIAL ALVEOLAR LAVAGE  Performed at [facility], [place] [address], [place], [place] [address]    Special Requests   Final    NONE Performed at  [facility], [place] [address] [place], [place] [address]    Gram Stain   Final    WBC PRESENT,BOTH PMN AND MONONUCLEAR NO ORGANISMS SEEN    Culture   Final    NO GROWTH Performed at [facility], [place] [address], [place], [place] [address]    Report Status 03/19/2018 FINAL  Final  Fungus Culture Result     Status: None   Collection Time: 03/16/18  5:08 PM  Result Value Ref Range Status   Result 1 Comment  Final    Comment: (NOTE) KOH/Calcofluor preparation:  no fungus observed. Performed At: [facility] [place], [place] [id number] Rush Farmer MD PB:[id number]          Radiology Studies: Dg Chest Port 1 View  Result Date: 03/19/2018 CLINICAL DATA:  Respiratory failure EXAM: PORTABLE CHEST 1 VIEW COMPARISON:  March 18, 2018 FINDINGS: Airspace consolidation is noted throughout both upper and mid lung regions and to a lesser extent in the left lower lobe region. There is cardiomegaly with pulmonary vascularity normal. No adenopathy. There is aortic atherosclerosis. No bone lesions. IMPRESSION: Multifocal infiltrate felt to represent extensive pneumonia, stable. No new appreciable opacity compared to 1 day prior. Stable cardiomegaly. There is aortic atherosclerosis. Aortic Atherosclerosis (ICD10-I70.0). Electronically Signed   By: Lowella Grip III M.D.   On: 03/19/2018 07:19        Scheduled Meds: . amLODipine  5 mg Oral QHS  . chlorhexidine  15 mL Mouth Rinse BID  . famotidine  20 mg Oral BID  . gabapentin  100 mg Oral BID  . insulin aspart  0-15 Units Subcutaneous TID AC & HS  . levothyroxine  100 mcg Oral Q0600  . mouth rinse  15 mL Mouth Rinse q12n4p  . nebivolol  10 mg Oral Daily  . predniSONE  60 mg Oral Q breakfast   Continuous Infusions:   LOS: 5 days    Time spent: 40 minutes    Irine Seal, MD [facility] Pager [phone]  If 7PM-7AM, please contact  night-coverage [email or web address] Password [email or web address] 03/20/2018, 9:56 AM

## 2018-03-20 NOTE — Progress Notes (Signed)
Called to restart PIV.Patient states,"I don't want to get stuck again.I will go home tomorrow."Notified primary RN.

## 2018-03-21 LAB — BASIC METABOLIC PANEL
Anion gap: 10 (ref 5–15)
BUN: 73 mg/dL — ABNORMAL HIGH (ref 8–23)
CALCIUM: 8.6 mg/dL — AB (ref 8.9–10.3)
CO2: 24 mmol/L (ref 22–32)
Chloride: 104 mmol/L (ref 98–111)
Creatinine, Ser: 2.03 mg/dL — ABNORMAL HIGH (ref 0.44–1.00)
GFR calc Af Amer: 26 mL/min — ABNORMAL LOW (ref >60)
GFR calc non Af Amer: 22 mL/min — ABNORMAL LOW (ref >60)
Glucose, Bld: 115 mg/dL — ABNORMAL HIGH (ref 70–99)
Potassium: 3.9 mmol/L (ref 3.5–5.1)
Sodium: 138 mmol/L (ref 135–145)

## 2018-03-21 LAB — CBC WITH DIFFERENTIAL/PLATELET
Abs Immature Granulocytes: 0.24 10*3/uL — ABNORMAL HIGH (ref 0.00–0.07)
BASOS PCT: 0 %
Basophils Absolute: 0 10*3/uL (ref 0.0–0.1)
EOS ABS: 0 10*3/uL (ref 0.0–0.5)
Eosinophils Relative: 0 %
HCT: 22.4 % — ABNORMAL LOW (ref 36.0–46.0)
Hemoglobin: 7.4 g/dL — ABNORMAL LOW (ref 12.0–15.0)
Immature Granulocytes: 2 %
Lymphocytes Relative: 6 %
Lymphs Abs: 0.6 10*3/uL — ABNORMAL LOW (ref 0.7–4.0)
MCH: 30.6 pg (ref 26.0–34.0)
MCHC: 33 g/dL (ref 30.0–36.0)
MCV: 92.6 fL (ref 80.0–100.0)
Monocytes Absolute: 0.9 10*3/uL (ref 0.1–1.0)
Monocytes Relative: 10 %
Neutro Abs: 8 10*3/uL — ABNORMAL HIGH (ref 1.7–7.7)
Neutrophils Relative %: 82 %
Platelets: 295 10*3/uL (ref 150–400)
RBC: 2.42 MIL/uL — ABNORMAL LOW (ref 3.87–5.11)
RDW: 13.3 % (ref 11.5–15.5)
WBC: 9.8 10*3/uL (ref 4.0–10.5)
nRBC: 0.2 % (ref 0.0–0.2)

## 2018-03-21 LAB — GLUCOSE, CAPILLARY: Glucose-Capillary: 140 mg/dL — ABNORMAL HIGH (ref 70–99)

## 2018-03-21 MED ORDER — FUROSEMIDE 20 MG PO TABS: 20.0000 mg | ORAL_TABLET | Freq: Every day | ORAL | 0 refills | Status: DC

## 2018-03-21 MED ORDER — FAMOTIDINE 10 MG PO TABS: 10.0000 mg | ORAL_TABLET | Freq: Two times a day (BID) | ORAL | 0 refills | Status: DC

## 2018-03-21 MED ORDER — FAMOTIDINE 10 MG PO TABS: 10.0000 mg | ORAL_TABLET | Freq: Two times a day (BID) | ORAL | 0 refills | Status: AC

## 2018-03-21 MED ORDER — PREDNISONE 20 MG PO TABS: 40.0000 mg | ORAL_TABLET | Freq: Every day | ORAL | 0 refills | Status: DC

## 2018-03-21 MED ORDER — PREDNISONE 20 MG PO TABS: 40.0000 mg | ORAL_TABLET | Freq: Every day | ORAL | 0 refills | Status: AC

## 2018-03-21 NOTE — Progress Notes (Signed)
Physical Therapy Treatment Patient Details Name: [name] [name] MRN: [id number] DOB: [birth year] Today's Date: 03/21/2018    History of Present Illness 84 year old female with a past medical history significant for small vessel vasculitis , carcinoid tumor of the lung, breast cancer  presented 03/15/18  with shortness of breath and hemoptysis.  CT  showed diffuse bilateral airspace disease, patchy consolidation, and tracheobronchomalacia.  She developed worsening respiratory distress and required intubation. extubated 12/11.    PT Comments    The patient is much improved. Ambulated around room,to BR woth supervison. Patient  Encouraged to ambulate short distances more frequently. Recommend HHPT.   Follow Up Recommendations  Home health PT     Equipment Recommendations    none   Recommendations for Other Services       Precautions / Restrictions Precautions Precautions: Fall Precaution Comments: wats sats Restrictions Weight Bearing Restrictions: No    Mobility  Bed Mobility Overal bed mobility: Independent                Transfers Overall transfer level: Needs assistance Equipment used: Rolling walker (2 wheeled) Transfers: Sit to/from Stand Sit to Stand: Supervision         General transfer comment: no assistance, steady standing up  Ambulation/Gait Ambulation/Gait assistance: Supervision Gait Distance (Feet): 40 Feet Assistive device: Rolling walker (2 wheeled) Gait Pattern/deviations: Step-through pattern     General Gait Details: patient maneuvred in room, to BR. sat down on window seat, stood and returned to bed with supervision   Stairs             Wheelchair Mobility    Modified Rankin (Stroke Patients Only)       Balance Overall balance assessment: Independent         Standing balance support: During functional activity;No upper extremity supported Standing balance-Leahy Scale: Fair                               Cognition Arousal/Alertness: Awake/alert Behavior During Therapy: WFL for tasks assessed/performed                                          Exercises      General Comments        Pertinent Vitals/Pain Pain Assessment: No/denies pain    Home Living                      Prior Function            PT Goals (current goals can now be found in the care plan section) Progress towards PT goals: Progressing toward goals    Frequency    Min 2X/week      PT Plan Current plan remains appropriate;Discharge plan needs to be updated    Co-evaluation              AM-PAC PT "6 Clicks" Mobility   Outcome Measure  Help needed turning from your back to your side while in a flat bed without using bedrails?: None Help needed moving from lying on your back to sitting on the side of a flat bed without using bedrails?: None Help needed moving to and from a bed to a chair (including a wheelchair)?: A Little Help needed standing up from a chair using your arms (e.g., wheelchair or bedside chair)?: A  Little Help needed to walk in hospital room?: A Little Help needed climbing 3-5 steps with a railing? : A Lot 6 Click Score: 19    End of Session   Activity Tolerance: Patient tolerated treatment well   Nurse Communication: Mobility status PT Visit Diagnosis: Unsteadiness on feet (R26.81)     Time: 6816-6196 PT Time Calculation (min) (ACUTE ONLY): 15 min  Charges:  $Gait Training: 8-22 mins                     Tresa Endo PT Acute Rehabilitation Services Pager [phone] Office [phone]    Claretha Cooper 03/21/2018, 9:52 AM

## 2018-03-21 NOTE — Care Management Note (Signed)
Case Management Note  Patient Details  Name: [name] [name] MRN: [id number] Date of Birth: 02-25-1933  Subjective/Objective:   PNA,  Acute resp failure with hypoxemia, ARF on CKD                Action/Plan: NCM spoke to pt and offered choice for HH/CMS list provided/placed on chart. Pt requested [facility] for HH. Has RW at home. Son and husband will be at home to assist with care.   Expected Discharge Date:  03/21/18               Expected Discharge Plan:  [place]  In-House Referral:  NA  Discharge planning Services  CM Consult  Post Acute Care Choice:  Home Health Choice offered to:  Patient  DME Arranged:  N/A DME Agency:  NA  HH Arranged:  PT, OT HH Agency:  [facility]  Status of Service:  Completed, signed off  If discussed at [place] of Stay Meetings, dates discussed:    Additional Comments:  Erenest Rasher, RN 03/21/2018, 10:55 AM

## 2018-03-21 NOTE — Discharge Summary (Signed)
Physician Discharge Summary  [name] [name] WOE:[id number] DOB: 1932/04/25 DOA: 03/15/2018  PCP: [name], MD  Admit date: 03/15/2018 Discharge date: 03/21/2018  Time spent: 65 minutes  Recommendations for Outpatient Follow-up:  1. Follow-up with Geraldo Pitter, NP pulmonary on 03/22/2018 at 10 AM as scheduled.  Patient's Wegener's granulomatosis (granulomatosis with polyangiitis) will need to be followed up upon.  Patient is being discharged on prednisone 40 mg daily.  Patient will need a basic metabolic profile done on follow-up as well as a CBC.  Patient's Eliquis has been discontinued on discharge and will defer resumption of Eliquis to pulmonary. 2. Follow-up with [name], MD in 2 weeks.  On follow-up patient will need a basic metabolic profile done to follow-up on electrolytes and renal function.   Discharge Diagnoses:  Principal Problem:   Acute respiratory failure with hypoxemia (HCC) Active Problems:   Wegener's granulomatosis (granulomatosis with polyangiitis) (HCC)   Essential hypertension   Diastolic CHF, chronic (HCC) - on low dose Lasix. ARB & BB along with Norvasc   Chronic atrial fibrillation (HCC) CHADS2-VASc=6.  On Corrected "low" dose of Eliquis   Type 2 diabetes mellitus with neurological complications (HCC)   Pneumonia   Acute renal failure superimposed on stage 4 chronic kidney disease ([place])   Discharge Condition: Stable and improved  Diet recommendation: Heart healthy  Filed Weights   03/15/18 0905 03/15/18 1500  Weight: 70.7 kg 68.6 kg    History of present illness:  Per Dr. Felipa Eth is a 84 y.o. female with medical history significant of Wegener's disease, chronic atrial fibrillation, dyslipidemia, hypothyroidism and hypertension.  Patient had been not feeling well for the last 10 days, with postnasal dripping and coughing, occasional hemoptysis.  She was seen by her pulmonologist Dr. Annamaria Boots December 5, who follows her for  pulmonary Wegener's granulomatosis.  She was advised to continue taking prednisone 5 mg daily.  Her CT chest from June suggested possible progression of her disease.  A CT chest high resolution was requested.  At home patient continued to have postnasal dripping, congestion and dry cough, her symptoms were more severe last night, associated with dyspnea, no improving or worsening factors, no associated chest pain or fevers.  She used nasal sprays with no improvement of her symptoms.  Due to persistent symptoms she came to the hospital for further evaluation.  Patient uses supplemental oxygen nasal cannula at night.   ED Course: Patient had a full work-up performed, her chest x-ray was suggestive of pneumonia, she was referred for admission for evaluation.   Hospital Course:  1 acute respiratory failure with hypoxemia likely secondary to small vessel vasculitis/Wegener's granulomatosis (granulomatosis with polyangiitis) Patient admitted with acute respiratory failure with hypoxemia and presented with hemoptysis chest x-ray initially concerning for multilobar pneumonia.  Patient noted to have a prior history of Wegener's diagnosed in 2014 and at that time also had some renal insufficiency.  Patient clinically improved, speaking in full sentence with good sats on room air by day of discharge.   Urinalysis during this hospitalization showed proteinuria and 21-50 RBCs.  Blood cultures with no growth to date.  Tracheal aspirate from 03/16/2018 was negative.  Fungal BAL negative.  ANCA titers from 03/15/2018 with C-ANCA 1:640, P-ANCA <1:20, ANCA proteinase 3 from 03/15/2018 was greater than 100.  Patient had presented with hemoptysis.  Patient initially treated empirically on IV antibiotics had to be intubated and subsequently extubated.  Patient received high-dose IV pulse steroids as well as  rituximab given on 03/18/2018 which she will need weekly for total of 4 weeks.  Patient transitioned to prednisone 60  mg daily per pulmonary and critical care and then subsequently down to 40 mg on discharge with a tapered down for 4 to 6month to 20 ng daily with close outpatient follow-up with pulmonary.  Antibiotics have been discontinued and patient remained afebrile with a normal white count.  By day of discharge patient was satting 99% on room air.  Patient be discharged home in stable and improved condition and is to follow-up with pulmonary on 03/22/2018.  Appointment has been made.    2.  Acute renal failure on chronic kidney disease stage IV (baseline creatinine 1.7-2.1) Felt likely secondary to aggressive diuresis early on during the hospitalization versus secondary to granulomatosis with polyangiitis.  Urinalysis did show evidence of glomerulonephritis with persistent proteinuria and RBCs.  Patient's diuretics were held.  Patient hydrated with IV fluids and creatinine down to 2.03 from 2.0 from 2.43 from 2.23 from 1.97.  Renal function was at baseline by day of discharge.  Outpatient follow-up.   3.  Hypothyroidism Continued on home dose Synthroid.  Outpatient follow-up with PCP.  4.  Diabetes mellitus Hemoglobin A1c was 6.3 on 12/22/2017.    Patient was maintained on sliding scale insulin during the hospitalization as well as Lantus 5 units for 1 day for better blood glucose control as patient was noted to be on prednisone 60 mg during the hospitalization.  Patient be discharged back on her home regimen of oral hypoglycemic agents.  Outpatient follow-up with PCP.  5.  History of carcinoid tumor/history of breast cancer Outpatient follow-up.  6 chronic atrial fibrillation ( CHA2DS2VASC =6) Remained rate controlled on Bystolic.  Eliquis was held secondary to problem #1 as patient had presented with hemoptysis.  Will defer resumption of anticoagulation to PCP/pulmonary on follow-up.  Patient will be discharged off her anticoagulation.  7.  Chronic diastolic heart failure Remained stable.  Patient's  diuretics were held during the hospitalization and patient will be resumed back on her home regimen of Lasix 2 days post discharge.    8 hypertension Continued on home regimen of Bystolic and Norvasc as patient improved clinically during the hospitalization.  9.  Debility/deconditioning Patient initially assessed by PT who was recommending SNF.  Patient however seems to be a little bit stronger than she was on initial assessment by PT.  Patient wants to go home states she can get 24-hour supervision and help at home and asking whether she can get home health therapies.    Patient was reassessed by physical therapy and home health therapies were recommended.  Patient will be discharged home with home health therapies.      Procedures: ETT 12/9 >> 12/11 CT chest high-resolution 03/15/2018 Chest x-ray 03/15/2008 19, 03/16/2018, 03/17/2018, 03/18/2018, 03/19/2018  Micro Data:  BCx2 12/9 >> U. Strep 12/10 >>negative  UA 12/9 >> moderate Hgb, 30 protein, 21-50 RBC Tracheal aspirate 12/10 >>negative  BAL 12/10 >> Fungal (BAL)12/10 >>negative  AFB (BAL) 12/10 >> negative smear    Significant Hospital Events   12/09 Admit with SOB, acute onset hemoptysis >intubated late pm 12/10 Remains on vent, PEEP5 / FiO2 30%. FOB with diffuse bloody secretions, no active bleed 12/11 Extubated 12/12 Completed IV high dose steroids  12/13 Transitioned to oral prednisone   Significant Diagnostic Tests:  12/9 CXR >>consolidation in the left upper lobe as well as the right upper lobe, diffuse bilateral airspace disease  HRCT 12/9 >>  appearance of lungs most compatible with severe multilobar bronchopneumonia, diffuse alveolar hemorrhage could also be considered, cardiomegaly with biatrial dilatation, tracheobronchomalacia, 2.2cm low-attenuation lesion in the tail of the pancreas similar dating back to 2015, hepatic steatosis ANCA Titers 12/9 >>C-ANCA 1:640, P-ANCA <1:20 ANCA Proteinase 3  12/9 >> greater than 100    Consultations:  Nephrology: Dr.Schertz 03/16/2018  Pulmonary and critical care: Dr. [place] Bells 03/15/2018  Discharge Exam: Vitals:   03/21/18 0524 03/21/18 0906  BP: (!) 160/89 (!) 157/93  Pulse: 77 88  Resp: 16   Temp: 97.6 F (36.4 C)   SpO2: 99%     General: NAD Cardiovascular: Irregular irregular Respiratory: Decreasing bibasilar crackles.  No wheezing.  No rhonchi.  Discharge Instructions   Discharge Instructions    Diet - low sodium heart healthy   Complete by:  As directed    Increase activity slowly   Complete by:  As directed      Allergies as of 03/21/2018      Reactions   Codeine Sulfate Other (See Comments)   REACTION: GI upset   Sulfonamide Derivatives Other (See Comments)   REACTION: GI upset   Vancomycin Other (See Comments)   Red man syndrome   Atarax [hydroxyzine] Nausea Only, Rash      Medication List    STOP taking these medications   ELIQUIS 2.5 MG Tabs tablet Generic drug:  apixaban     TAKE these medications   acetaminophen 500 MG tablet Commonly known as:  TYLENOL Take 1,000 mg by mouth every 6 (six) hours as needed for headache.   amLODipine 5 MG tablet Commonly known as:  NORVASC Take 5 mg by mouth at bedtime.   BYSTOLIC 10 MG tablet Generic drug:  nebivolol TAKE 1 TABLET BY MOUTH ONCE DAILY AFTER  BREAKFAST What changed:  See the new instructions.   CENTRUM SILVER 50+WOMEN Tabs Take 1 tablet by mouth 3 (three) times a week.   diazepam 5 MG tablet Commonly known as:  VALIUM TAKE 1 TABLET BY MOUTH EVERY 12 HOURS AS NEEDED FOR SEVERE VERTIGO FLARES. AVOID REGULAR USE. What changed:  See the new instructions.   famotidine 10 MG tablet Commonly known as:  PEPCID Take 1 tablet (10 mg total) by mouth 2 (two) times daily.   feeding supplement (ENSURE ENLIVE) Liqd Take 237 mLs by mouth 2 (two) times daily between meals.   furosemide 20 MG tablet Commonly known as:  LASIX Take 1 tablet (20 mg  total) by mouth daily. Start taking on:  March 23, 2018 What changed:  These instructions start on March 23, 2018. If you are unsure what to do until then, ask your doctor or other care provider.   gabapentin 100 MG capsule Commonly known as:  NEURONTIN TAKE 1 CAPSULE BY MOUTH TWICE DAILY BEFORE MEAL(S) What changed:  See the new instructions.   glimepiride 2 MG tablet Commonly known as:  AMARYL TAKE ONE TABLET BY MOUTH ONCE DAILY BEFORE BREAKFAST What changed:  See the new instructions.   guaiFENesin 600 MG 12 hr tablet Commonly known as:  MUCINEX Take 1 tablet (600 mg total) by mouth 2 (two) times daily as needed for cough.   levothyroxine 100 MCG tablet Commonly known as:  SYNTHROID, LEVOTHROID TAKE 1 TABLET BY MOUTH ONCE DAILY BEFORE BREAKFAST. What changed:  See the new instructions.   ondansetron 4 MG disintegrating tablet Commonly known as:  ZOFRAN-ODT Take 1 tablet (4 mg total) by mouth every 8 (eight) hours as needed for  nausea or vomiting.   predniSONE 20 MG tablet Commonly known as:  DELTASONE Take 2 tablets (40 mg total) by mouth daily with breakfast. Start taking on:  March 22, 2018 What changed:    medication strength  how much to take  how to take this  when to take this  additional instructions      Allergies  Allergen Reactions  . Codeine Sulfate Other (See Comments)    REACTION: GI upset  . Sulfonamide Derivatives Other (See Comments)    REACTION: GI upset  . Vancomycin Other (See Comments)    Red man syndrome  . Atarax [Hydroxyzine] Nausea Only and Rash   Follow-up Information    Martyn Ehrich, NP Follow up on 03/22/2018.   Specialty:  Pulmonary Disease Why:  Appt at 10:00 AM.  Please arrive at 9:45 for check in.  Contact information: [address] [place] [address] [phone]        [name], MD. Schedule an appointment as soon as possible for a visit in 2 week(s).   Specialty:  Family  Medicine Contact information: [place] [address] [phone]            The results of significant diagnostics from this hospitalization (including imaging, microbiology, ancillary and laboratory) are listed below for reference.    Significant Diagnostic Studies: Dg Chest 2 View  Result Date: 03/15/2018 CLINICAL DATA:  Decreased oxygen saturation. History of breast cancer. EXAM: CHEST - 2 VIEW COMPARISON:  Single-view of the chest 08/26/2017. CT chest 09/17/2017. FINDINGS: Dense airspace disease is present in the upper lobes bilaterally most consistent with multifocal pneumonia. There is also some airspace disease in the right middle lobe. Marked cardiomegaly is identified. No pneumothorax or pleural fluid. Aortic atherosclerosis is noted. No acute or focal bony abnormality. IMPRESSION: Dense bilateral upper lobe airspace disease and patchy airspace opacity in the right middle lobe most consistent with pneumonia. Cardiomegaly. Atherosclerosis. Electronically Signed   By: Inge Rise M.D.   On: 03/15/2018 09:52   Dg Abd 1 View  Result Date: 03/15/2018 CLINICAL DATA:  OG tube placement EXAM: ABDOMEN - 1 VIEW COMPARISON:  06/30/2016 FINDINGS: Esophageal tube tip and side port overlie the stomach. Postsurgical changes in the abdomen. Partially visible air filled bowel in the upper abdomen. IMPRESSION: Esophageal tube tip overlies the gastric body Electronically Signed   By: Donavan Foil M.D.   On: 03/15/2018 19:32   Ct Chest High Resolution  Result Date: 03/15/2018 CLINICAL DATA:  84 year old female with history of small vessel vasculitis, carcinoid tumor of the lung and breast cancer. Shortness of breath and hemoptysis. EXAM: CT CHEST WITHOUT CONTRAST TECHNIQUE: Multidetector CT imaging of the chest was performed following the standard protocol without intravenous contrast. High resolution imaging of the lungs, as well as inspiratory and expiratory imaging, was  performed. COMPARISON:  Chest CT 09/17/2017. FINDINGS: Cardiovascular: Heart size is mildly enlarged with biatrial dilatation. Trace amount of pericardial fluid and/or thickening, unlikely to be of any hemodynamic significance at this time. No associated pericardial calcification. There is aortic atherosclerosis, as well as atherosclerosis of the great vessels of the mediastinum and the coronary arteries, including calcified atherosclerotic plaque in the left main, left anterior descending, left circumflex and right coronary arteries. Mediastinum/Nodes: No pathologically enlarged mediastinal or hilar lymph nodes. Please note that accurate exclusion of hilar adenopathy is limited on noncontrast CT scans. Small hiatal hernia. No axillary lymphadenopathy. Lungs/Pleura: Today's study is severely limited  by considerable patient respiratory motion, as well as widespread multifocal airspace disease throughout all aspects of the lungs bilaterally. This limits the diagnostic sensitivity and specificity of the examination for assessment of underlying interstitial lung disease. In addition to patchy areas of frank airspace consolidation with internal air bronchograms, there is widespread peribronchovascular ground-glass attenuation scattered throughout all aspects of the lungs bilaterally. No definite honeycombing is confidently identified on today's limited examination. No definite bronchiectasis. Inspiratory and expiratory imaging demonstrates partial collapse of the trachea and mainstem bronchi, indicative of tracheobronchomalacia. Upper Abdomen: 2.2 cm low-attenuation lesion in the tail of the pancreas (axial image 135 of series 3), incompletely characterized on today's noncontrast CT examination, but similar to prior chest CT 04/14/2013. Aortic atherosclerosis. Diffuse low attenuation throughout the visualized hepatic parenchyma, indicative of hepatic steatosis. Musculoskeletal: There are no aggressive appearing lytic or  blastic lesions noted in the visualized portions of the skeleton. IMPRESSION: 1. The appearance of the lungs is most compatible with severe multilobar bronchopneumonia. In the setting of hemoptysis, the possibility of diffuse alveolar hemorrhage could also be considered, but is not strongly favored. 2. Cardiomegaly with biatrial dilatation. 3. Tracheobronchomalacia. 4. Aortic atherosclerosis, in addition to left main and 3 vessel coronary artery disease. 5. Hepatic steatosis. 6. 2.2 cm low-attenuation lesion in the tail of the pancreas, similar dating back to 2015, incompletely characterized on today's noncontrast CT examination. Given the stability over the prior 4 years this is considered likely benign, and no specific imaging follow-up is recommended. This recommendation follows ACR consensus guidelines: Management of Incidental Pancreatic Cysts: A White Paper of the ACR Incidental Findings Committee. [place] 0998;[id number]. Aortic Atherosclerosis (ICD10-I70.0). Electronically Signed   By: Vinnie Langton M.D.   On: 03/15/2018 17:13   Dg Chest Port 1 View  Result Date: 03/19/2018 CLINICAL DATA:  Respiratory failure EXAM: PORTABLE CHEST 1 VIEW COMPARISON:  March 18, 2018 FINDINGS: Airspace consolidation is noted throughout both upper and mid lung regions and to a lesser extent in the left lower lobe region. There is cardiomegaly with pulmonary vascularity normal. No adenopathy. There is aortic atherosclerosis. No bone lesions. IMPRESSION: Multifocal infiltrate felt to represent extensive pneumonia, stable. No new appreciable opacity compared to 1 day prior. Stable cardiomegaly. There is aortic atherosclerosis. Aortic Atherosclerosis (ICD10-I70.0). Electronically Signed   By: Lowella Grip III M.D.   On: 03/19/2018 07:19   Dg Chest Port 1 View  Result Date: 03/18/2018 CLINICAL DATA:  Respiratory failure EXAM: PORTABLE CHEST 1 VIEW COMPARISON:  03/17/2018 FINDINGS: Cardiac shadow is  enlarged but stable. Multifocal waxing and waning infiltrates are identified now seen primarily in the right mid lung as well as the left mid and lower lung. The endotracheal tube and nasogastric catheter have been removed in the interval. No pneumothorax or effusion is seen. IMPRESSION: Multifocal infiltrative changes waxing and weaning from the prior exam with some improved aeration in the right lung base. Electronically Signed   By: Inez Catalina M.D.   On: 03/18/2018 07:26   Dg Chest Port 1 View  Result Date: 03/17/2018 CLINICAL DATA:  Acute respiratory failure with hypoxia, history hypertension, stroke, breast cancer, chronic atrial fibrillation, pulmonary hypertension, malignant carcinoid tumor, type II diabetes mellitus, former smoker EXAM: PORTABLE CHEST 1 VIEW COMPARISON:  Portable exam 0503 hours compared to 03/16/2018 FINDINGS: Tip of endotracheal tube projects 2.6 cm above carina. Nasogastric tube extends into stomach. Enlargement of cardiac silhouette . Mediastinal contours normal. Atherosclerotic calcification aorta. BILATERAL patchy pulmonary infiltrates again identified, favor  pneumonia, slightly improved in RIGHT upper lobe. No definite pleural effusion or pneumothorax. Bones demineralized. IMPRESSION: Persistent BILATERAL pulmonary infiltrates, slightly improved in RIGHT upper lobe since previous exam. Electronically Signed   By: Lavonia Dana M.D.   On: 03/17/2018 09:15   Portable Chest Xray  Result Date: 03/16/2018 CLINICAL DATA:  Acute respiratory failure with hypoxemia. EXAM: PORTABLE CHEST 1 VIEW COMPARISON:  Radiograph of March 15, 2018. FINDINGS: Stable cardiomegaly. Endotracheal and nasogastric tubes are unchanged in position. Atherosclerosis of thoracic aorta is noted. No pneumothorax or significant pleural effusion is noted. Stable large bilateral diffuse airspace opacities are noted most consistent with pneumonia. IMPRESSION: Stable support apparatus. Stable bilateral lung  airspace opacities as described above. Aortic Atherosclerosis (ICD10-I70.0). Electronically Signed   By: Marijo Conception, M.D.   On: 03/16/2018 07:49   Portable Chest X-ray  Result Date: 03/15/2018 CLINICAL DATA:  ETT position EXAM: PORTABLE CHEST 1 VIEW COMPARISON:  03/15/2018, 09/26/2017, CT 03/15/2018 FINDINGS: Endotracheal tube tip is about 17 mm superior to the carina. Esophageal tube tip below the diaphragm but non included. Cardiomegaly with aortic atherosclerosis. Multifocal airspace consolidations. No pneumothorax. IMPRESSION: 1. Endotracheal tube tip about 17 mm superior to the carina 2. Multifocal bilateral airspace consolidations suspect for multifocal pneumonia 3. Cardiomegaly Electronically Signed   By: Donavan Foil M.D.   On: 03/15/2018 19:34    Microbiology: Recent Results (from the past 240 hour(s))  Culture, blood (Routine x 2)     Status: None   Collection Time: 03/15/18  9:06 AM  Result Value Ref Range Status   Specimen Description   Final    BLOOD BLOOD LEFT FOREARM Performed at [facility], [place] [address], [place], [place] [address]    Special Requests   Final    BOTTLES DRAWN AEROBIC AND ANAEROBIC Blood Culture results may not be optimal due to an excessive volume of blood received in culture bottles Performed at [facility] [address] [place], [place] [address]    Culture   Final    NO GROWTH 5 DAYS Performed at [facility], [place] [address], [place], [place] [address]    Report Status 03/20/2018 FINAL  Final  Culture, blood (Routine x 2)     Status: None   Collection Time: 03/15/18  9:13 AM  Result Value Ref Range Status   Specimen Description   Final    BLOOD BLOOD RIGHT FOREARM Performed at [facility] [address] [place], [place] [address]    Special Requests   Final    BOTTLES DRAWN AEROBIC AND ANAEROBIC Blood Culture adequate volume Performed at [facility] [address], [place], [place] [address]    Culture   Final    NO GROWTH 5 DAYS Performed at [facility], [place] [address] [place], [place] [address]    Report Status 03/20/2018 FINAL  Final  MRSA PCR Screening     Status: None   Collection Time: 03/15/18  5:27 PM  Result Value Ref Range Status   MRSA by PCR NEGATIVE NEGATIVE Final    Comment:        The GeneXpert MRSA Assay (FDA approved for NASAL specimens only), is one component of a comprehensive MRSA colonization surveillance program. It is not intended to diagnose MRSA infection nor to guide or monitor treatment for MRSA infections. Performed at [facility], [place] [address] [place],  [address]   Culture, respiratory (non-expectorated)  Status: None   Collection Time: 03/16/18  8:38 AM  Result Value Ref Range Status   Specimen Description   Final    TRACHEAL ASPIRATE Performed at [facility] [address], [place], [place] [address]    Special Requests   Final    NONE Performed at [facility], [place] [address], [place], [place] [address]    Gram Stain NO WBC SEEN NO ORGANISMS SEEN   Final   Culture   Final    NO GROWTH 2 DAYS Performed at [facility], [place] [address], [place], [place] [address]    Report Status 03/18/2018 FINAL  Final  Fungus Culture With Stain     Status: None (Preliminary result)   Collection Time: 03/16/18  5:08 PM  Result Value Ref Range Status   Fungus Stain Final report  Final    Comment: (NOTE) Performed At: [facility] [place], [place] [id number] Rush Farmer MD [id number]    Fungus (Mycology) Culture PENDING  Incomplete   Fungal Source BRONCHIAL ALVEOLAR LAVAGE  Final    Comment: Performed at [facility], [place] [address], [place], [place] [address]  Acid Fast Smear (AFB)     Status: None   Collection Time: 03/16/18  5:08 PM  Result Value Ref Range  Status   AFB Specimen Processing Concentration  Final   Acid Fast Smear Negative  Final    Comment: (NOTE) Performed At: [facility] [place], [place] [id number] Rush Farmer MD [id number]    Source (AFB) BRONCHIAL ALVEOLAR LAVAGE  Final    Comment: Performed at [facility], [place] [address] [place], [place] [address]  Culture, respiratory     Status: None   Collection Time: 03/16/18  5:08 PM  Result Value Ref Range Status   Specimen Description   Final    BRONCHIAL ALVEOLAR LAVAGE Performed at [facility] [address], [place], [place] [address]    Special Requests   Final    NONE Performed at [facility], [place] [address], [place], [place] [address]    Gram Stain   Final    WBC PRESENT,BOTH PMN AND MONONUCLEAR NO ORGANISMS SEEN    Culture   Final    NO GROWTH Performed at [facility], [place] [address] [place], [place] [address]    Report Status 03/19/2018 FINAL  Final  Fungus Culture Result     Status: None   Collection Time: 03/16/18  5:08 PM  Result Value Ref Range Status   Result 1 Comment  Final    Comment: (NOTE) KOH/Calcofluor preparation:  no fungus observed. Performed At: [facility] [place], [place] [id number] Rush Farmer MD LJ:[id number]      Labs: Basic Metabolic Panel: Recent Labs  Lab 03/17/18 0303 03/18/18 0257 03/19/18 0316 03/20/18 0235 03/20/18 0240 03/21/18 0441  NA 136 136 137  --  139 138  K 3.8 3.4* 3.3*  --  3.2* 3.9  CL 100 101 101  --  105 104  CO2 21* 23 24  --  22 24  GLUCOSE 276* 207* 213*  --  128* 115*  BUN 60* 71* 73*  --  74* 73*  CREATININE 2.16* 2.23* 2.43*  --  2.00* 2.03*  CALCIUM 8.3* 8.4* 8.5*  --  8.7* 8.6*  MG 2.2  --   --  2.5*  --   --  PHOS 4.5  --   --   --   --   --    Liver Function Tests: Recent Labs  Lab 03/15/18 0913 03/17/18 0303  AST 16 15  ALT 10 9  ALKPHOS 55 48  BILITOT  1.3* 0.8  PROT 6.7 7.1  ALBUMIN 3.5 3.1*   No results for input(s): LIPASE, AMYLASE in the last 168 hours. No results for input(s): AMMONIA in the last 168 hours. CBC: Recent Labs  Lab 03/15/18 0913  03/17/18 0303 03/18/18 0257 03/19/18 0316 03/20/18 0240 03/21/18 0441  WBC 8.6   < > 6.6 9.8 7.5 7.0 9.8  NEUTROABS 6.6  --   --   --   --   --  8.0*  HGB 7.2*   < > 7.6* 7.1* 7.2* 7.0* 7.4*  HCT 22.8*   < > 23.9* 22.6* 23.2* 22.9* 22.4*  MCV 97.9   < > 98.0 99.1 98.3 97.9 92.6  PLT 210   < > 244 284 286 267 295   < > = values in this interval not displayed.   Cardiac Enzymes: No results for input(s): CKTOTAL, CKMB, CKMBINDEX, TROPONINI in the last 168 hours. BNP: BNP (last 3 results) Recent Labs    09/13/17 2020 09/26/17 1439 03/15/18 0914  BNP 1,527.2* 1,195.9* 1,123.7*    ProBNP (last 3 results) No results for input(s): PROBNP in the last 8760 hours.  CBG: Recent Labs  Lab 03/20/18 0819 03/20/18 1151 03/20/18 1800 03/20/18 2150 03/21/18 0747  GLUCAP 153* 179* 230* 292* 140*       Signed:  Irine Seal MD.  [facility] 03/21/2018, 10:40 AM

## 2018-03-21 NOTE — Care Management Important Message (Signed)
Important Message  Patient Details  Name: [name] [name] MRN: [id number] Date of Birth: 22-May-1932   Medicare Important Message Given:  Yes    Erenest Rasher, RN 03/21/2018, 10:40 AM

## 2018-03-22 ENCOUNTER — Encounter: Payer: Self-pay | Admitting: Primary Care

## 2018-03-22 ENCOUNTER — Ambulatory Visit (INDEPENDENT_AMBULATORY_CARE_PROVIDER_SITE_OTHER): Payer: Medicare Other | Admitting: Primary Care

## 2018-03-22 ENCOUNTER — Telehealth: Payer: Self-pay | Admitting: Pulmonary Disease

## 2018-03-22 ENCOUNTER — Telehealth: Payer: Self-pay | Admitting: Primary Care

## 2018-03-22 VITALS — BP 128/70 | HR 67 | Temp 97.7°F | Ht 66.0 in

## 2018-03-22 DIAGNOSIS — I2729 Other secondary pulmonary hypertension: Secondary | ICD-10-CM | POA: Diagnosis not present

## 2018-03-22 DIAGNOSIS — I5032 Chronic diastolic (congestive) heart failure: Secondary | ICD-10-CM

## 2018-03-22 DIAGNOSIS — R0600 Dyspnea, unspecified: Secondary | ICD-10-CM

## 2018-03-22 DIAGNOSIS — D649 Anemia, unspecified: Secondary | ICD-10-CM | POA: Diagnosis not present

## 2018-03-22 DIAGNOSIS — Z7984 Long term (current) use of oral hypoglycemic drugs: Secondary | ICD-10-CM | POA: Diagnosis not present

## 2018-03-22 DIAGNOSIS — E1149 Type 2 diabetes mellitus with other diabetic neurological complication: Secondary | ICD-10-CM | POA: Diagnosis not present

## 2018-03-22 DIAGNOSIS — N184 Chronic kidney disease, stage 4 (severe): Secondary | ICD-10-CM

## 2018-03-22 DIAGNOSIS — R042 Hemoptysis: Secondary | ICD-10-CM | POA: Diagnosis not present

## 2018-03-22 DIAGNOSIS — I130 Hypertensive heart and chronic kidney disease with heart failure and stage 1 through stage 4 chronic kidney disease, or unspecified chronic kidney disease: Secondary | ICD-10-CM | POA: Diagnosis not present

## 2018-03-22 DIAGNOSIS — E1122 Type 2 diabetes mellitus with diabetic chronic kidney disease: Secondary | ICD-10-CM | POA: Diagnosis not present

## 2018-03-22 DIAGNOSIS — J181 Lobar pneumonia, unspecified organism: Secondary | ICD-10-CM | POA: Diagnosis not present

## 2018-03-22 DIAGNOSIS — I482 Chronic atrial fibrillation, unspecified: Secondary | ICD-10-CM

## 2018-03-22 DIAGNOSIS — M3130 Wegener's granulomatosis without renal involvement: Secondary | ICD-10-CM

## 2018-03-22 DIAGNOSIS — Z853 Personal history of malignant neoplasm of breast: Secondary | ICD-10-CM | POA: Diagnosis not present

## 2018-03-22 DIAGNOSIS — Z96651 Presence of right artificial knee joint: Secondary | ICD-10-CM | POA: Diagnosis not present

## 2018-03-22 DIAGNOSIS — Z8673 Personal history of transient ischemic attack (TIA), and cerebral infarction without residual deficits: Secondary | ICD-10-CM | POA: Diagnosis not present

## 2018-03-22 DIAGNOSIS — Z87891 Personal history of nicotine dependence: Secondary | ICD-10-CM | POA: Diagnosis not present

## 2018-03-22 DIAGNOSIS — J9601 Acute respiratory failure with hypoxia: Secondary | ICD-10-CM | POA: Diagnosis not present

## 2018-03-22 LAB — BASIC METABOLIC PANEL
BUN: 66 mg/dL — ABNORMAL HIGH (ref 6–23)
CO2: 25 mEq/L (ref 19–32)
Calcium: 9.1 mg/dL (ref 8.4–10.5)
Chloride: 98 mEq/L (ref 96–112)
Creatinine, Ser: 1.96 mg/dL — ABNORMAL HIGH (ref 0.40–1.20)
GFR: 25.85 mL/min — ABNORMAL LOW (ref >60.00)
Glucose, Bld: 170 mg/dL — ABNORMAL HIGH (ref 70–99)
Potassium: 3.3 mEq/L — ABNORMAL LOW (ref 3.5–5.1)
Sodium: 135 mEq/L (ref 135–145)

## 2018-03-22 LAB — BRAIN NATRIURETIC PEPTIDE: PRO B NATRI PEPTIDE: 1910 pg/mL — AB (ref 0.0–100.0)

## 2018-03-22 LAB — CBC
HCT: 23.7 % — CL (ref 36.0–46.0)
Hemoglobin: 8 g/dL — CL (ref 12.0–15.0)
MCHC: 33.6 g/dL (ref 30.0–36.0)
MCV: 92.1 fl (ref 78.0–100.0)
Platelets: 361 10*3/uL (ref 150.0–400.0)
RBC: 2.58 Mil/uL — AB (ref 3.87–5.11)
RDW: 13.8 % (ref 11.5–15.5)
WBC: 12.8 10*3/uL — AB (ref 4.0–10.5)

## 2018-03-22 NOTE — Telephone Encounter (Signed)
Received phone call from Santiago Glad at the lab.  Patient had 2 labs she needed to notify about:  Hgb 8 Hct  23.7  Patient saw Derl Barrow, NP today. Routing to her.

## 2018-03-22 NOTE — Telephone Encounter (Signed)
Called by [name] [name] d/t she was called to be at [facility] at 8 AM for an infusion. Review of her chart reveals NO documentation of that call. She was seen in pulmonary clinic today and that note has no reference to a planned infusion at [facility]. I recommended that she contact the PCCM office first thing in the morning to see what the call was referring to.

## 2018-03-22 NOTE — Assessment & Plan Note (Deleted)
-  Felt to be related to diuresis - Lasix on hold  - Rechecking BMET today  - If continues to worsen needs repeat nephrology consult

## 2018-03-22 NOTE — Telephone Encounter (Signed)
Noted, this is improved from 03/09/18. Hgb was 7.2 and HCT 23.2. Thank you

## 2018-03-22 NOTE — Assessment & Plan Note (Signed)
-  CBC today showed hgb 8.0 and HCT 23.7. This is improved from 03/09/18.

## 2018-03-22 NOTE — Assessment & Plan Note (Addendum)
-  Patient feels better with improved cough  - HRCT showed patchy consolidation, received ceftriaxone and azithromycin - WBC elevated, likely d/t recent steroid use  - Needs repeat CXR until resolution

## 2018-03-22 NOTE — Patient Instructions (Addendum)
Need to follow-up with PCP and/or cardiology- d/t continuing Eliquis for afib   Check with PCP about continuing lasix   CXR and labs today  Continue prednisone 62m daily tapering over 2 months to 276mdaily  (4053m 4 weeks; 33m32m4 weeks; then 20mg20mly)   Continue Rituximab 375mg/69mweekly for additional 3 weeks (4 total) - needs to be set up out patient around 12/19 (this will be her second infusion)  Follow up with Dr. Young Annamaria Boots6 weeks

## 2018-03-22 NOTE — Assessment & Plan Note (Signed)
-  BNP 1,910 today - Denies sob, no significant LE edema  - Lasix on hold d/t AKI, this has improved - Defer to PCP/cardiology to resume diuretic

## 2018-03-22 NOTE — Assessment & Plan Note (Signed)
-  Irregular rhythm today on exam, rate controlled  - Eliquis on hold d/t hemoptysis, which has now resolved - Discussed risk of stoke off of blood thinner, needs to discuss with cardiology

## 2018-03-22 NOTE — Progress Notes (Addendum)
_0  ID: Dionicio Stall, female    DOB: [birth year], 84 y.o.   MRN: [id number]  Chief Complaint  Patient presents with  . Follow-up    little cough, tired    Referring provider: [name], MD  HPI: 84 year old female, former smoker. PMH significant for wegener's granulomatosis, acute small vessel vasculitis, afib, hypertension, diastolic HF, pneumonia, DM2, chronic kidney disease stage 4. Patient of Dr. Bertrum Sol, last seen early December. Recent hospitalization for cough with hemoptysis.  Hospital course 12/9/-12/15: Presented to ED with complaints of not feeling well for 10 days, associated cough with hemoptysis. Symptoms worsened and she became more dyspneic She was admitted with respiratory failure with hypoxemia. Required intubation from 12/9-12/11. CXR concerning for multilobar pneumonia. ?CAP. Received ceftriaxone and azithromycin. HRCT showed diffuse bilateral air space disease, patchy consolidation, tracheobronchomalacia. Appearance of lungs most consistent with severe multilobar bronchopneumonia, diffuse alveolar hemorrhage also could be considered. Received IV high dose steroid, transitioned to oral prednisone. Acute kidney injury, possibly d/t Diuresis. Needs nephrology c/s again if worsening. Eliquis held d/t hemoptysis. Needs Rituximab 356m/m^2 weekly for a total of 4 weeks. Tolerated first infusion, second due around 12/13.   03/23/2018 Patient presents today for hospital follow-up. Accompanied by her son. Reports that she feels much better. Continues to have some cough, no production. Associated weakness. Using oxygen at night. Denies hemoptysis, sob, chest tightness or wheezing. PT called to set up home therapy. Drinking ensures twice daily. Monitoring blood glucose at home, most recent A1C 6.3. Eliquis discontinued during hospital admission d/t hemoptysis. This has resolved, will have patient discuss with PCP and/or cardiology on for chronic afib. Awaiting labs and CXR.     Allergies  Allergen Reactions  . Codeine Sulfate Other (See Comments)    REACTION: GI upset  . Sulfonamide Derivatives Other (See Comments)    REACTION: GI upset  . Vancomycin Other (See Comments)    Red man syndrome  . Atarax [Hydroxyzine] Nausea Only and Rash    Immunization History  Administered Date(s) Administered  . Influenza Split 01/23/2011, 01/09/2012  . Influenza Whole 01/16/2009, 02/06/2010  . Influenza, High Dose Seasonal PF 01/24/2015, 12/31/2015, 01/07/2017, 12/22/2017  . Influenza,inj,Quad PF,6+ Mos 12/28/2012, 01/09/2014  . Pneumococcal Conjugate-13 01/24/2015  . Pneumococcal Polysaccharide-23 08/28/2009  . Zoster 03/04/2011    Past Medical History:  Diagnosis Date  . Arthritis    r tkr  . [place] cancer [place]   . Chronic atrial fibrillation    CHADS2-VASc=6.  on low dose Eliquis (corrected for age & renal fxn)  . CVA (cerebral vascular accident) ([place] 2008   mini stroke.no residual  . DEPRESSION 05/16/2009  . Diabetes mellitus type II   . Heart murmur    stress test 2009.dr peter j[place]ique . HYPERLIPIDEMIA 07/24/2008  . HYPERTENSION 07/24/2008  . HYPOTHYROIDISM 07/24/2008  . Intermittent vertigo   . Metastatic carcinoid tumor to intra-abdominal site ([place]   . Pneumonia   . Pulmonary hypertension ([place]    Mod PHTN on Echo 03/2016: 2/2 combination of Wegener's, HFPF & Afib.  . Skin abnormality    facial lesions .pt applying mupiracin to areas  . Vertigo   . Wegener's disease, pulmonary ([place] 10/2012    Tobacco History: Social History   Tobacco Use  Smoking Status Former Smoker  . Packs/day: 0.50  . Years: 12.00  . Pack years: 6.00  . Types: Cigarettes  . Last attempt to quit: 06/04/1982  . Years since quitting: 35.8  Smokeless Tobacco Never Used  Counseling given: Not Answered   Outpatient Medications Prior to Visit  Medication Sig Dispense Refill  . acetaminophen (TYLENOL) 500 MG tablet Take 1,000 mg by mouth every 6 (six) hours as  needed for headache.     Marland Kitchen amLODipine (NORVASC) 5 MG tablet Take 5 mg by mouth at bedtime.   2  . BYSTOLIC 10 MG tablet TAKE 1 TABLET BY MOUTH ONCE DAILY AFTER  BREAKFAST (Patient taking differently: Take 10 mg by mouth daily. ) 90 tablet 1  . diazepam (VALIUM) 5 MG tablet TAKE 1 TABLET BY MOUTH EVERY 12 HOURS AS NEEDED FOR SEVERE VERTIGO FLARES. AVOID REGULAR USE. (Patient taking differently: Take 2.5-5 mg by mouth every 12 (twelve) hours as needed (vertigo). ) 20 tablet 0  . famotidine (PEPCID) 10 MG tablet Take 1 tablet (10 mg total) by mouth 2 (two) times daily. 60 tablet 0  . feeding supplement, ENSURE ENLIVE, (ENSURE ENLIVE) LIQD Take 237 mLs by mouth 2 (two) times daily between meals. 237 mL 12  . furosemide (LASIX) 20 MG tablet Take 1 tablet (20 mg total) by mouth daily. 90 tablet 0  . gabapentin (NEURONTIN) 100 MG capsule TAKE 1 CAPSULE BY MOUTH TWICE DAILY BEFORE MEAL(S) (Patient taking differently: Take 100 mg by mouth 2 (two) times daily at 8 am and 10 pm. ) 60 capsule 3  . glimepiride (AMARYL) 2 MG tablet TAKE ONE TABLET BY MOUTH ONCE DAILY BEFORE BREAKFAST (Patient taking differently: Take 2 mg by mouth daily as needed (high blood sugar). ) 90 tablet 1  . guaiFENesin (MUCINEX) 600 MG 12 hr tablet Take 1 tablet (600 mg total) by mouth 2 (two) times daily as needed for cough. 30 tablet 0  . levothyroxine (SYNTHROID, LEVOTHROID) 100 MCG tablet TAKE 1 TABLET BY MOUTH ONCE DAILY BEFORE BREAKFAST. (Patient taking differently: Take 100 mcg by mouth daily before breakfast. ) 90 tablet 2  . Multiple Vitamins-Minerals (CENTRUM SILVER 50+WOMEN) TABS Take 1 tablet by mouth 3 (three) times a week.    . ondansetron (ZOFRAN-ODT) 4 MG disintegrating tablet Take 1 tablet (4 mg total) by mouth every 8 (eight) hours as needed for nausea or vomiting. 15 tablet 0  . predniSONE (DELTASONE) 20 MG tablet Take 2 tablets (40 mg total) by mouth daily with breakfast. 60 tablet 0   No facility-administered  medications prior to visit.     Review of Systems  Review of Systems  Constitutional: Negative.   HENT: Negative.   Respiratory: Positive for cough. Negative for shortness of breath and wheezing.   Cardiovascular: Negative.   Neurological: Positive for weakness.    Physical Exam  BP 128/70 (BP Location: Right Arm, Cuff Size: Normal)   Pulse 67   Temp 97.7 F (36.5 C)   Ht _0  (1.676 m)   SpO2 96%   BMI 24.41 kg/m  Physical Exam Constitutional:      General: She is not in acute distress.    Appearance: Normal appearance.  HENT:     Head: Normocephalic and atraumatic.     Nose: Nose normal.  Eyes:     Extraocular Movements: Extraocular movements intact.     Pupils: Pupils are equal, round, and reactive to light.  Neck:     Musculoskeletal: Normal range of motion and neck supple.  Cardiovascular:     Rate and Rhythm: Normal rate. Rhythm irregular.     Comments: Trace LLE edema.  Pulmonary:     Effort: Pulmonary effort is normal. No respiratory distress.  Breath sounds: No wheezing.     Comments: Crackles bases L>R Musculoskeletal:     Comments: In WC, gait not assessed  Skin:    General: Skin is warm and dry.     Comments: Scattered eacchymosis  Neurological:     General: No focal deficit present.     Mental Status: She is alert and oriented to person, place, and time. Mental status is at baseline.  Psychiatric:        Mood and Affect: Mood normal.        Behavior: Behavior normal.        Thought Content: Thought content normal.        Judgment: Judgment normal.      Lab Results:  CBC    Component Value Date/Time   WBC 12.8 (H) 03/22/2018 1056   RBC 2.58 (L) 03/22/2018 1056   HGB 8.0 Repeated and verified X2. (LL) 03/22/2018 1056   HGB 9.4 (L) 02/09/2014 1018   HCT 23.7 Repeated and verified X2. (LL) 03/22/2018 1056   HCT 28.8 (L) 02/09/2014 1018   PLT 361.0 03/22/2018 1056   PLT 231 02/09/2014 1018   MCV 92.1 03/22/2018 1056   MCV 88.6  02/09/2014 1018   MCH 30.6 03/21/2018 0441   MCHC 33.6 03/22/2018 1056   RDW 13.8 03/22/2018 1056   RDW 14.0 02/09/2014 1018   LYMPHSABS 0.6 (L) 03/21/2018 0441   LYMPHSABS 1.6 02/09/2014 1018   MONOABS 0.9 03/21/2018 0441   MONOABS 1.1 (H) 02/09/2014 1018   EOSABS 0.0 03/21/2018 0441   EOSABS 0.3 02/09/2014 1018   BASOSABS 0.0 03/21/2018 0441   BASOSABS 0.0 02/09/2014 1018    BMET    Component Value Date/Time   NA 135 03/22/2018 1056   NA 131 (L) 02/08/2013 0945   K 3.3 (L) 03/22/2018 1056   K 5.0 02/08/2013 0945   CL 98 03/22/2018 1056   CL 98 08/02/2012 0824   CO2 25 03/22/2018 1056   CO2 21 (L) 02/08/2013 0945   GLUCOSE 170 (H) 03/22/2018 1056   GLUCOSE 156 (H) 02/08/2013 0945   GLUCOSE 190 (H) 08/02/2012 0824   BUN 66 (H) 03/22/2018 1056   BUN 29.7 (H) 02/08/2013 0945   CREATININE 1.96 (H) 03/22/2018 1056   CREATININE 1.61 (H) 10/07/2016 1547   CREATININE 1.8 (H) 02/08/2013 0945   CALCIUM 9.1 03/22/2018 1056   CALCIUM 9.0 02/08/2013 0945   GFRNONAA 22 (L) 03/21/2018 0441   GFRAA 26 (L) 03/21/2018 0441    BNP    Component Value Date/Time   BNP 1,123.7 (H) 03/15/2018 0914     Imaging: Dg Chest 2 View  Result Date: 03/15/2018 CLINICAL DATA:  Decreased oxygen saturation. History of breast cancer. EXAM: CHEST - 2 VIEW COMPARISON:  Single-view of the chest 08/26/2017. CT chest 09/17/2017. FINDINGS: Dense airspace disease is present in the upper lobes bilaterally most consistent with multifocal pneumonia. There is also some airspace disease in the right middle lobe. Marked cardiomegaly is identified. No pneumothorax or pleural fluid. Aortic atherosclerosis is noted. No acute or focal bony abnormality. IMPRESSION: Dense bilateral upper lobe airspace disease and patchy airspace opacity in the right middle lobe most consistent with pneumonia. Cardiomegaly. Atherosclerosis. Electronically Signed   By: Inge Rise M.D.   On: 03/15/2018 09:52   Dg Abd 1 View  Result  Date: 03/15/2018 CLINICAL DATA:  OG tube placement EXAM: ABDOMEN - 1 VIEW COMPARISON:  06/30/2016 FINDINGS: Esophageal tube tip and side port overlie the stomach. Postsurgical changes  in the abdomen. Partially visible air filled bowel in the upper abdomen. IMPRESSION: Esophageal tube tip overlies the gastric body Electronically Signed   By: Donavan Foil M.D.   On: 03/15/2018 19:32   Ct Chest High Resolution  Result Date: 03/15/2018 CLINICAL DATA:  84 year old female with history of small vessel vasculitis, carcinoid tumor of the lung and breast cancer. Shortness of breath and hemoptysis. EXAM: CT CHEST WITHOUT CONTRAST TECHNIQUE: Multidetector CT imaging of the chest was performed following the standard protocol without intravenous contrast. High resolution imaging of the lungs, as well as inspiratory and expiratory imaging, was performed. COMPARISON:  Chest CT 09/17/2017. FINDINGS: Cardiovascular: Heart size is mildly enlarged with biatrial dilatation. Trace amount of pericardial fluid and/or thickening, unlikely to be of any hemodynamic significance at this time. No associated pericardial calcification. There is aortic atherosclerosis, as well as atherosclerosis of the great vessels of the mediastinum and the coronary arteries, including calcified atherosclerotic plaque in the left main, left anterior descending, left circumflex and right coronary arteries. Mediastinum/Nodes: No pathologically enlarged mediastinal or hilar lymph nodes. Please note that accurate exclusion of hilar adenopathy is limited on noncontrast CT scans. Small hiatal hernia. No axillary lymphadenopathy. Lungs/Pleura: Today's study is severely limited by considerable patient respiratory motion, as well as widespread multifocal airspace disease throughout all aspects of the lungs bilaterally. This limits the diagnostic sensitivity and specificity of the examination for assessment of underlying interstitial lung disease. In addition to  patchy areas of frank airspace consolidation with internal air bronchograms, there is widespread peribronchovascular ground-glass attenuation scattered throughout all aspects of the lungs bilaterally. No definite honeycombing is confidently identified on today's limited examination. No definite bronchiectasis. Inspiratory and expiratory imaging demonstrates partial collapse of the trachea and mainstem bronchi, indicative of tracheobronchomalacia. Upper Abdomen: 2.2 cm low-attenuation lesion in the tail of the pancreas (axial image 135 of series 3), incompletely characterized on today's noncontrast CT examination, but similar to prior chest CT 04/14/2013. Aortic atherosclerosis. Diffuse low attenuation throughout the visualized hepatic parenchyma, indicative of hepatic steatosis. Musculoskeletal: There are no aggressive appearing lytic or blastic lesions noted in the visualized portions of the skeleton. IMPRESSION: 1. The appearance of the lungs is most compatible with severe multilobar bronchopneumonia. In the setting of hemoptysis, the possibility of diffuse alveolar hemorrhage could also be considered, but is not strongly favored. 2. Cardiomegaly with biatrial dilatation. 3. Tracheobronchomalacia. 4. Aortic atherosclerosis, in addition to left main and 3 vessel coronary artery disease. 5. Hepatic steatosis. 6. 2.2 cm low-attenuation lesion in the tail of the pancreas, similar dating back to 2015, incompletely characterized on today's noncontrast CT examination. Given the stability over the prior 4 years this is considered likely benign, and no specific imaging follow-up is recommended. This recommendation follows ACR consensus guidelines: Management of Incidental Pancreatic Cysts: A White Paper of the ACR Incidental Findings Committee. [place] 0630;[id number]. Aortic Atherosclerosis (ICD10-I70.0). Electronically Signed   By: Vinnie Langton M.D.   On: 03/15/2018 17:13   Dg Chest Port 1 View  Result  Date: 03/19/2018 CLINICAL DATA:  Respiratory failure EXAM: PORTABLE CHEST 1 VIEW COMPARISON:  March 18, 2018 FINDINGS: Airspace consolidation is noted throughout both upper and mid lung regions and to a lesser extent in the left lower lobe region. There is cardiomegaly with pulmonary vascularity normal. No adenopathy. There is aortic atherosclerosis. No bone lesions. IMPRESSION: Multifocal infiltrate felt to represent extensive pneumonia, stable. No new appreciable opacity compared to 1 day prior. Stable cardiomegaly. There is aortic  atherosclerosis. Aortic Atherosclerosis (ICD10-I70.0). Electronically Signed   By: Lowella Grip III M.D.   On: 03/19/2018 07:19   Dg Chest Port 1 View  Result Date: 03/18/2018 CLINICAL DATA:  Respiratory failure EXAM: PORTABLE CHEST 1 VIEW COMPARISON:  03/17/2018 FINDINGS: Cardiac shadow is enlarged but stable. Multifocal waxing and waning infiltrates are identified now seen primarily in the right mid lung as well as the left mid and lower lung. The endotracheal tube and nasogastric catheter have been removed in the interval. No pneumothorax or effusion is seen. IMPRESSION: Multifocal infiltrative changes waxing and weaning from the prior exam with some improved aeration in the right lung base. Electronically Signed   By: Inez Catalina M.D.   On: 03/18/2018 07:26   Dg Chest Port 1 View  Result Date: 03/17/2018 CLINICAL DATA:  Acute respiratory failure with hypoxia, history hypertension, stroke, breast cancer, chronic atrial fibrillation, pulmonary hypertension, malignant carcinoid tumor, type II diabetes mellitus, former smoker EXAM: PORTABLE CHEST 1 VIEW COMPARISON:  Portable exam 0503 hours compared to 03/16/2018 FINDINGS: Tip of endotracheal tube projects 2.6 cm above carina. Nasogastric tube extends into stomach. Enlargement of cardiac silhouette . Mediastinal contours normal. Atherosclerotic calcification aorta. BILATERAL patchy pulmonary infiltrates again  identified, favor pneumonia, slightly improved in RIGHT upper lobe. No definite pleural effusion or pneumothorax. Bones demineralized. IMPRESSION: Persistent BILATERAL pulmonary infiltrates, slightly improved in RIGHT upper lobe since previous exam. Electronically Signed   By: Lavonia Dana M.D.   On: 03/17/2018 09:15   Portable Chest Xray  Result Date: 03/16/2018 CLINICAL DATA:  Acute respiratory failure with hypoxemia. EXAM: PORTABLE CHEST 1 VIEW COMPARISON:  Radiograph of March 15, 2018. FINDINGS: Stable cardiomegaly. Endotracheal and nasogastric tubes are unchanged in position. Atherosclerosis of thoracic aorta is noted. No pneumothorax or significant pleural effusion is noted. Stable large bilateral diffuse airspace opacities are noted most consistent with pneumonia. IMPRESSION: Stable support apparatus. Stable bilateral lung airspace opacities as described above. Aortic Atherosclerosis (ICD10-I70.0). Electronically Signed   By: Marijo Conception, M.D.   On: 03/16/2018 07:49   Portable Chest X-ray  Result Date: 03/15/2018 CLINICAL DATA:  ETT position EXAM: PORTABLE CHEST 1 VIEW COMPARISON:  03/15/2018, 09/26/2017, CT 03/15/2018 FINDINGS: Endotracheal tube tip is about 17 mm superior to the carina. Esophageal tube tip below the diaphragm but non included. Cardiomegaly with aortic atherosclerosis. Multifocal airspace consolidations. No pneumothorax. IMPRESSION: 1. Endotracheal tube tip about 17 mm superior to the carina 2. Multifocal bilateral airspace consolidations suspect for multifocal pneumonia 3. Cardiomegaly Electronically Signed   By: Donavan Foil M.D.   On: 03/15/2018 19:34     Assessment & Plan:   Wegener's granulomatosis (granulomatosis with polyangiitis) (HCC) - Needs Rituximab 363m/m^2 weekly for a total of 4 weeks. Tolerated first infusion, second due around 03/25/18. This is to be set up out patient at infusion center, paperwork completed and faxed.  - Continue prednisone 459m daily tapering over 2 months to 2049maily  (70m19m4 weeks; 30mg78m weeks; then 20mg 79my)    Cough with hemoptysis - Resolved - Eliquis on hold, needs to follow-up with PCP and/or cardiology to discuss   Chronic atrial fibrillation (HCC) CHADS2-VASc=6.  On Corrected "low" dose of Eliquis - Irregular rhythm today on exam, rate controlled  - Eliquis on hold d/t hemoptysis, which has now resolved - Discussed risk of stoke off of blood thinner, needs to discuss with cardiology   Community acquired pneumonia - Patient feels better with improved cough  - HRCT  showed patchy consolidation, received ceftriaxone and azithromycin - WBC elevated, likely d/t recent steroid use  - Needs repeat CXR until resolution   Acute renal failure superimposed on stage 4 chronic kidney disease ([place]) - Felt to be related to diuresis - Lasix on hold  - Creatinine level 1.96 today (from 2.43) - If continues to worsen needs repeat nephrology consult   Diastolic CHF, chronic (HCC) - on low dose Lasix. ARB & BB along with Norvasc - BNP 1,910 today - Denies sob, no significant LE edema  - Lasix on hold d/t AKI, this has improved - Defer to PCP/cardiology to resume diuretic   Anemia - CBC today showed hgb 8.0 and HCT 23.7. This is improved from 03/09/18.   Follow up with Dr. Annamaria Boots in 4-6 weeks   Martyn Ehrich, NP 03/23/2018

## 2018-03-22 NOTE — Assessment & Plan Note (Addendum)
-  Needs Rituximab 39m/m^2 weekly for a total of 4 weeks. Tolerated first infusion, second due around 03/25/18. This is to be set up out patient at infusion center, paperwork completed and faxed.  - Continue prednisone 418mdaily tapering over 2 months to 2060maily  (25m62m4 weeks; 30mg29m weeks; then 20mg 70my)

## 2018-03-22 NOTE — Assessment & Plan Note (Addendum)
-  Felt to be related to diuresis - Lasix on hold  - Creatinine level 1.96 today (from 2.43) - If continues to worsen needs repeat nephrology consult

## 2018-03-22 NOTE — Assessment & Plan Note (Signed)
-  Resolved - Eliquis on hold, needs to follow-up with PCP and/or cardiology to discuss

## 2018-03-22 NOTE — Assessment & Plan Note (Deleted)
-  Patient feels better, cough has improved  - Likey d/t recent steriod use - Awaiting CXR

## 2018-03-23 ENCOUNTER — Ambulatory Visit: Payer: Medicare Other | Admitting: Family Medicine

## 2018-03-23 ENCOUNTER — Telehealth: Payer: Self-pay

## 2018-03-23 NOTE — Telephone Encounter (Signed)
Thank you Heather.

## 2018-03-23 NOTE — Telephone Encounter (Signed)
Please advise.

## 2018-03-23 NOTE — Telephone Encounter (Signed)
Patient needs Rituximab infusion weekly for wegeners granulomatosis for total of 4 treatments. She received her first infusion while admitted at [facility]. She is due for second infusion on 12/19 and was schedule for 8am with short stay infusion center (phone number is [phone]). From what I can tell patient cancelled this apt and it has been reschedule for 12/20 at 8am. Nothing further needed.

## 2018-03-23 NOTE — Telephone Encounter (Signed)
ok

## 2018-03-23 NOTE — Telephone Encounter (Signed)
Called and spoke with pt letting her know the results of labwork. Pt expressed understanding. Nothing further needed.

## 2018-03-23 NOTE — Telephone Encounter (Signed)
Copied from [place] [id number]. Topic: Medical Record Request - Other >> Mar 23, 2018 10:45 AM Oneta Rack wrote: Osvaldo Human name: Debbie  Relation to pt : PT with [facility]  Call back number: [phone]    Reason for call:   PT requesting verbal orders to continue 2x 3 and nursing orders to review medication management due to patient not taking her medication properly. As per PT patient cancelled her appointment for today due to transportation concerns and another Dr. Appointment conflict.

## 2018-03-23 NOTE — Telephone Encounter (Signed)
Called Debbie with [facility] and gave her verbal OK. Debbie verbalized an understanding.  Jackelyn Poling also stated that patient was very confused, especially about medications and felt her heart was very out of rhythm. Jackelyn Poling stated that Pulmonary is waiting for her to see Cardiology and trying to get her son to take her. Debbie wanted me to send you an FYI.

## 2018-03-23 NOTE — Telephone Encounter (Signed)
Louie Casa, patient's son, is returning call from [place] believe it is with results...[phone] (pt home) or [phone] Louie Casa).

## 2018-03-23 NOTE — Telephone Encounter (Signed)
Transition Care Management Follow-up Telephone Call   Date discharged? 03/21/18   How have you been since you were released from the hospital? " I'm weak."   Do you understand why you were in the hospital? yes   Do you understand the discharge instructions? yes   Where were you discharged to? home   Items Reviewed:  Medications reviewed: yes  Allergies reviewed: yes  Dietary changes reviewed: yes, drink 2 ensures a day  Referrals reviewed: yes   Functional Questionnaire:   Activities of Daily Living (ADLs):   She states they are independent in the following: ambulation, bathing and hygiene, feeding, continence, grooming, toileting and dressing States they require assistance with the following: none   Any transportation issues/concerns?: yes, "my son brings me"   Any patient concerns? no   Confirmed importance and date/time of follow-up visits scheduled yes  Provider Appointment booked with Dr. Elease Hashimoto on Monday December 23rd at 2:20pm  Confirmed with patient if condition begins to worsen call PCP or go to the ER.  Patient was given the office number and encouraged to call back with question or concerns.  : yes

## 2018-03-23 NOTE — Telephone Encounter (Signed)
After reviewing the patients chart the patient is to receive Rituximab 343m/m^2 weekly for a total of 4 weeks. Tolerated first infusion, second due around 12/13. This is to be set up out patient at infusion center, paperwork completed and faxed. Per BDerl BarrowNP last note from 03/22/18. I have called the patient and explained that it is at the Short stay medical day I gave her the information  For the address and I explianied that she would need to be there on 03/26/18 at 8am. She verblized understanding.  I have called the son on the DPR to talk with him about his mom confussion and that I have rescheduled the cancel apt for the same date and time for the patient to make him aware.  I have called [facility] and spoke with her about the confusion and she has re-added the patient to the schedule for 03/26/18 at 8am.

## 2018-03-23 NOTE — Progress Notes (Signed)
Reviewed, agree.

## 2018-03-24 ENCOUNTER — Telehealth: Payer: Self-pay | Admitting: Family Medicine

## 2018-03-24 ENCOUNTER — Other Ambulatory Visit (HOSPITAL_COMMUNITY): Payer: Self-pay | Admitting: *Deleted

## 2018-03-24 DIAGNOSIS — E1122 Type 2 diabetes mellitus with diabetic chronic kidney disease: Secondary | ICD-10-CM | POA: Diagnosis not present

## 2018-03-24 DIAGNOSIS — J9601 Acute respiratory failure with hypoxia: Secondary | ICD-10-CM | POA: Diagnosis not present

## 2018-03-24 DIAGNOSIS — J181 Lobar pneumonia, unspecified organism: Secondary | ICD-10-CM | POA: Diagnosis not present

## 2018-03-24 DIAGNOSIS — I130 Hypertensive heart and chronic kidney disease with heart failure and stage 1 through stage 4 chronic kidney disease, or unspecified chronic kidney disease: Secondary | ICD-10-CM | POA: Diagnosis not present

## 2018-03-24 DIAGNOSIS — I482 Chronic atrial fibrillation, unspecified: Secondary | ICD-10-CM | POA: Diagnosis not present

## 2018-03-24 DIAGNOSIS — M3130 Wegener's granulomatosis without renal involvement: Secondary | ICD-10-CM | POA: Diagnosis not present

## 2018-03-24 NOTE — Telephone Encounter (Signed)
Ok to set up

## 2018-03-24 NOTE — Telephone Encounter (Signed)
Please advise.

## 2018-03-24 NOTE — Telephone Encounter (Signed)
Copied from [place] [phone]. Topic: Quick Communication - Home Health Verbal Orders >> Mar 24, 2018  1:44 PM Leward Quan A wrote: Caller/Agency: Carrie/ [facility] Callback Number: [phone] ok to LM Requesting OT/PT/Skilled Nursing/Social Work: Skilled nursing Frequency:  1 x week for 4 weeks with 1 prn visit

## 2018-03-24 NOTE — Telephone Encounter (Signed)
Called Carrie with [facility] and gave her the verbal OK per Dr. Elease Hashimoto. Morey Hummingbird verbalized an understanding.

## 2018-03-25 ENCOUNTER — Telehealth: Payer: Self-pay | Admitting: Family Medicine

## 2018-03-25 ENCOUNTER — Inpatient Hospital Stay (HOSPITAL_COMMUNITY): Admission: RE | Admit: 2018-03-25 | Payer: Medicare Other | Source: Ambulatory Visit

## 2018-03-25 DIAGNOSIS — M3130 Wegener's granulomatosis without renal involvement: Secondary | ICD-10-CM | POA: Diagnosis not present

## 2018-03-25 DIAGNOSIS — I130 Hypertensive heart and chronic kidney disease with heart failure and stage 1 through stage 4 chronic kidney disease, or unspecified chronic kidney disease: Secondary | ICD-10-CM | POA: Diagnosis not present

## 2018-03-25 DIAGNOSIS — J9601 Acute respiratory failure with hypoxia: Secondary | ICD-10-CM | POA: Diagnosis not present

## 2018-03-25 DIAGNOSIS — I482 Chronic atrial fibrillation, unspecified: Secondary | ICD-10-CM | POA: Diagnosis not present

## 2018-03-25 DIAGNOSIS — J181 Lobar pneumonia, unspecified organism: Secondary | ICD-10-CM | POA: Diagnosis not present

## 2018-03-25 DIAGNOSIS — E1122 Type 2 diabetes mellitus with diabetic chronic kidney disease: Secondary | ICD-10-CM | POA: Diagnosis not present

## 2018-03-25 NOTE — Telephone Encounter (Signed)
Copied from [place] [phone]. Topic: Quick Communication - Home Health Verbal Orders >> Mar 25, 2018  4:46 PM Bea Graff, NT wrote: Caller/Agency: [facility] Number: [phone] Requesting OT/PT/Skilled Nursing/Social Work: PT Frequency: 1 time a week for next week, and a make up visit for the future

## 2018-03-26 ENCOUNTER — Ambulatory Visit (INDEPENDENT_AMBULATORY_CARE_PROVIDER_SITE_OTHER)
Admission: RE | Admit: 2018-03-26 | Discharge: 2018-03-26 | Disposition: A | Discharge status: Home / Self Care | Payer: Medicare Other | Source: Ambulatory Visit | Attending: Internal Medicine | Admitting: Internal Medicine

## 2018-03-26 ENCOUNTER — Encounter (HOSPITAL_COMMUNITY): Payer: Medicare Other

## 2018-03-26 DIAGNOSIS — J849 Interstitial pulmonary disease, unspecified: Secondary | ICD-10-CM

## 2018-03-26 DIAGNOSIS — J181 Lobar pneumonia, unspecified organism: Secondary | ICD-10-CM | POA: Diagnosis not present

## 2018-03-26 NOTE — Telephone Encounter (Signed)
Please advise.

## 2018-03-26 NOTE — Telephone Encounter (Signed)
[name] [name] and gave him the verbal OK per Dr. Elease Hashimoto. [name] [name] verbalized an understanding.

## 2018-03-26 NOTE — Telephone Encounter (Signed)
OK 

## 2018-03-29 ENCOUNTER — Inpatient Hospital Stay: Payer: Medicare Other | Admitting: Family Medicine

## 2018-03-29 ENCOUNTER — Observation Stay (HOSPITAL_COMMUNITY)
Admission: EM | Admit: 2018-03-29 | Discharge: 2018-03-30 | Disposition: A | Discharge status: Home / Self Care | Payer: Medicare Other | Attending: Internal Medicine | Admitting: Internal Medicine

## 2018-03-29 ENCOUNTER — Other Ambulatory Visit: Payer: Self-pay

## 2018-03-29 ENCOUNTER — Encounter (HOSPITAL_COMMUNITY): Payer: Self-pay

## 2018-03-29 DIAGNOSIS — E1140 Type 2 diabetes mellitus with diabetic neuropathy, unspecified: Secondary | ICD-10-CM | POA: Insufficient documentation

## 2018-03-29 DIAGNOSIS — M3131 Wegener's granulomatosis with renal involvement: Secondary | ICD-10-CM

## 2018-03-29 DIAGNOSIS — N184 Chronic kidney disease, stage 4 (severe): Secondary | ICD-10-CM

## 2018-03-29 DIAGNOSIS — I482 Chronic atrial fibrillation, unspecified: Secondary | ICD-10-CM | POA: Diagnosis not present

## 2018-03-29 DIAGNOSIS — E785 Hyperlipidemia, unspecified: Secondary | ICD-10-CM | POA: Insufficient documentation

## 2018-03-29 DIAGNOSIS — Z79899 Other long term (current) drug therapy: Secondary | ICD-10-CM | POA: Insufficient documentation

## 2018-03-29 DIAGNOSIS — E1149 Type 2 diabetes mellitus with other diabetic neurological complication: Secondary | ICD-10-CM | POA: Diagnosis not present

## 2018-03-29 DIAGNOSIS — I272 Pulmonary hypertension, unspecified: Secondary | ICD-10-CM | POA: Diagnosis not present

## 2018-03-29 DIAGNOSIS — I5032 Chronic diastolic (congestive) heart failure: Secondary | ICD-10-CM

## 2018-03-29 DIAGNOSIS — M3130 Wegener's granulomatosis without renal involvement: Secondary | ICD-10-CM | POA: Diagnosis not present

## 2018-03-29 DIAGNOSIS — E871 Hypo-osmolality and hyponatremia: Secondary | ICD-10-CM | POA: Diagnosis not present

## 2018-03-29 DIAGNOSIS — I4891 Unspecified atrial fibrillation: Secondary | ICD-10-CM | POA: Diagnosis not present

## 2018-03-29 DIAGNOSIS — E039 Hypothyroidism, unspecified: Secondary | ICD-10-CM | POA: Diagnosis not present

## 2018-03-29 DIAGNOSIS — Z8673 Personal history of transient ischemic attack (TIA), and cerebral infarction without residual deficits: Secondary | ICD-10-CM | POA: Insufficient documentation

## 2018-03-29 DIAGNOSIS — I5082 Biventricular heart failure: Secondary | ICD-10-CM | POA: Insufficient documentation

## 2018-03-29 DIAGNOSIS — D649 Anemia, unspecified: Secondary | ICD-10-CM | POA: Diagnosis not present

## 2018-03-29 DIAGNOSIS — E1122 Type 2 diabetes mellitus with diabetic chronic kidney disease: Secondary | ICD-10-CM | POA: Insufficient documentation

## 2018-03-29 DIAGNOSIS — Z853 Personal history of malignant neoplasm of breast: Secondary | ICD-10-CM | POA: Insufficient documentation

## 2018-03-29 DIAGNOSIS — E038 Other specified hypothyroidism: Secondary | ICD-10-CM

## 2018-03-29 DIAGNOSIS — Z79890 Hormone replacement therapy (postmenopausal): Secondary | ICD-10-CM | POA: Diagnosis not present

## 2018-03-29 DIAGNOSIS — I10 Essential (primary) hypertension: Secondary | ICD-10-CM | POA: Diagnosis present

## 2018-03-29 DIAGNOSIS — R040 Epistaxis: Principal | ICD-10-CM | POA: Insufficient documentation

## 2018-03-29 DIAGNOSIS — I130 Hypertensive heart and chronic kidney disease with heart failure and stage 1 through stage 4 chronic kidney disease, or unspecified chronic kidney disease: Secondary | ICD-10-CM | POA: Diagnosis not present

## 2018-03-29 DIAGNOSIS — R69 Illness, unspecified: Secondary | ICD-10-CM

## 2018-03-29 DIAGNOSIS — C50919 Malignant neoplasm of unspecified site of unspecified female breast: Secondary | ICD-10-CM | POA: Diagnosis present

## 2018-03-29 LAB — CBC
HCT: 21.4 % — ABNORMAL LOW (ref 36.0–46.0)
Hemoglobin: 6.8 g/dL — CL (ref 12.0–15.0)
MCH: 30.9 pg (ref 26.0–34.0)
MCHC: 31.8 g/dL (ref 30.0–36.0)
MCV: 97.3 fL (ref 80.0–100.0)
PLATELETS: 221 10*3/uL (ref 150–400)
RBC: 2.2 MIL/uL — ABNORMAL LOW (ref 3.87–5.11)
RDW: 14.4 % (ref 11.5–15.5)
WBC: 16.4 10*3/uL — AB (ref 4.0–10.5)
nRBC: 0 % (ref 0.0–0.2)

## 2018-03-29 LAB — PROTIME-INR
INR: 1.14
Prothrombin Time: 14.5 seconds (ref 11.4–15.2)

## 2018-03-29 LAB — BASIC METABOLIC PANEL
Anion gap: 9 (ref 5–15)
BUN: 50 mg/dL — ABNORMAL HIGH (ref 8–23)
CALCIUM: 8.3 mg/dL — AB (ref 8.9–10.3)
CO2: 25 mmol/L (ref 22–32)
Chloride: 99 mmol/L (ref 98–111)
Creatinine, Ser: 1.64 mg/dL — ABNORMAL HIGH (ref 0.44–1.00)
GFR calc Af Amer: 33 mL/min — ABNORMAL LOW (ref >60)
GFR calc non Af Amer: 29 mL/min — ABNORMAL LOW (ref >60)
Glucose, Bld: 151 mg/dL — ABNORMAL HIGH (ref 70–99)
Potassium: 4.5 mmol/L (ref 3.5–5.1)
Sodium: 133 mmol/L — ABNORMAL LOW (ref 135–145)

## 2018-03-29 LAB — CBG MONITORING, ED: Glucose-Capillary: 129 mg/dL — ABNORMAL HIGH (ref 70–99)

## 2018-03-29 LAB — TSH: TSH: 2.678 u[IU]/mL (ref 0.350–4.500)

## 2018-03-29 LAB — GLUCOSE, CAPILLARY
Glucose-Capillary: 145 mg/dL — ABNORMAL HIGH (ref 70–99)
Glucose-Capillary: 120 mg/dL — ABNORMAL HIGH (ref 70–99)

## 2018-03-29 LAB — PHOSPHORUS: Phosphorus: 3.4 mg/dL (ref 2.5–4.6)

## 2018-03-29 LAB — PREPARE RBC (CROSSMATCH)

## 2018-03-29 LAB — MAGNESIUM: Magnesium: 2.1 mg/dL (ref 1.7–2.4)

## 2018-03-29 MED ORDER — ADULT MULTIVITAMIN W/MINERALS CH
1.0000 | ORAL_TABLET | Freq: Every day | ORAL | Status: DC
  Administered 2018-03-29 – 2018-03-30 (×2): 1 via ORAL
  Filled 2018-03-29 (×2): qty 1

## 2018-03-29 MED ORDER — ACETAMINOPHEN 325 MG PO TABS: 650.0000 mg | ORAL_TABLET | Freq: Four times a day (QID) | ORAL | Status: DC | PRN

## 2018-03-29 MED ORDER — FAMOTIDINE 20 MG PO TABS
10.0000 mg | ORAL_TABLET | Freq: Every day | ORAL | Status: DC
  Administered 2018-03-29 – 2018-03-30 (×2): 10 mg via ORAL
  Filled 2018-03-29 (×2): qty 1

## 2018-03-29 MED ORDER — ONDANSETRON HCL 4 MG/2ML IJ SOLN: 4.0000 mg | Freq: Four times a day (QID) | INTRAMUSCULAR | Status: DC | PRN

## 2018-03-29 MED ORDER — BISACODYL 10 MG RE SUPP: 10.0000 mg | Freq: Every day | RECTAL | Status: DC | PRN

## 2018-03-29 MED ORDER — ENSURE ENLIVE PO LIQD
237.0000 mL | Freq: Two times a day (BID) | ORAL | Status: DC
  Administered 2018-03-30: 237 mL via ORAL

## 2018-03-29 MED ORDER — FUROSEMIDE 10 MG/ML IJ SOLN
20.0000 mg | Freq: Once | INTRAMUSCULAR | Status: AC
  Administered 2018-03-29: 20 mg via INTRAVENOUS
  Filled 2018-03-29: qty 2

## 2018-03-29 MED ORDER — ACETAMINOPHEN 650 MG RE SUPP: 650.0000 mg | Freq: Four times a day (QID) | RECTAL | Status: DC | PRN

## 2018-03-29 MED ORDER — OXYMETAZOLINE HCL 0.05 % NA SOLN
1.0000 | Freq: Once | NASAL | Status: AC
  Administered 2018-03-29: 1 via NASAL
  Filled 2018-03-29 (×2): qty 15

## 2018-03-29 MED ORDER — AMLODIPINE BESYLATE 5 MG PO TABS
5.0000 mg | ORAL_TABLET | Freq: Every day | ORAL | Status: DC
  Administered 2018-03-29: 5 mg via ORAL
  Filled 2018-03-29: qty 1

## 2018-03-29 MED ORDER — INSULIN ASPART 100 UNIT/ML ~~LOC~~ SOLN: 0.0000 [IU] | Freq: Every day | SUBCUTANEOUS | Status: DC

## 2018-03-29 MED ORDER — SODIUM CHLORIDE 0.9% IV SOLUTION
Freq: Once | INTRAVENOUS | Status: AC
  Administered 2018-03-29: 19:00:00 via INTRAVENOUS

## 2018-03-29 MED ORDER — LORATADINE 10 MG PO TABS
10.0000 mg | ORAL_TABLET | Freq: Every day | ORAL | Status: DC
  Administered 2018-03-29 – 2018-03-30 (×2): 10 mg via ORAL
  Filled 2018-03-29 (×2): qty 1

## 2018-03-29 MED ORDER — MORPHINE SULFATE (PF) 2 MG/ML IV SOLN: 2.0000 mg | Freq: Once | INTRAVENOUS | Status: DC

## 2018-03-29 MED ORDER — NEBIVOLOL HCL 10 MG PO TABS
10.0000 mg | ORAL_TABLET | Freq: Every day | ORAL | Status: DC
  Administered 2018-03-29 – 2018-03-30 (×2): 10 mg via ORAL
  Filled 2018-03-29 (×2): qty 1

## 2018-03-29 MED ORDER — GABAPENTIN 100 MG PO CAPS
100.0000 mg | ORAL_CAPSULE | Freq: Two times a day (BID) | ORAL | Status: DC
  Administered 2018-03-29: 100 mg via ORAL
  Filled 2018-03-29 (×3): qty 1

## 2018-03-29 MED ORDER — LEVOTHYROXINE SODIUM 100 MCG PO TABS
100.0000 ug | ORAL_TABLET | Freq: Every day | ORAL | Status: DC
  Administered 2018-03-30: 100 ug via ORAL
  Filled 2018-03-29 (×2): qty 1

## 2018-03-29 MED ORDER — PREDNISONE 20 MG PO TABS
40.0000 mg | ORAL_TABLET | Freq: Every day | ORAL | Status: DC
  Administered 2018-03-30: 40 mg via ORAL
  Filled 2018-03-29 (×2): qty 2

## 2018-03-29 MED ORDER — ONDANSETRON HCL 4 MG PO TABS: 4.0000 mg | ORAL_TABLET | Freq: Four times a day (QID) | ORAL | Status: DC | PRN

## 2018-03-29 MED ORDER — ORAL CARE MOUTH RINSE
15.0000 mL | Freq: Two times a day (BID) | OROMUCOSAL | Status: DC
  Administered 2018-03-30: 15 mL via OROMUCOSAL

## 2018-03-29 MED ORDER — INSULIN ASPART 100 UNIT/ML ~~LOC~~ SOLN
0.0000 [IU] | Freq: Three times a day (TID) | SUBCUTANEOUS | Status: DC
  Administered 2018-03-29: 1 [IU] via SUBCUTANEOUS

## 2018-03-29 MED ORDER — OXYMETAZOLINE HCL 0.05 % NA SOLN
1.0000 | Freq: Two times a day (BID) | NASAL | Status: DC | PRN
  Filled 2018-03-29: qty 15

## 2018-03-29 MED ORDER — GUAIFENESIN ER 600 MG PO TB12: 600.0000 mg | ORAL_TABLET | Freq: Two times a day (BID) | ORAL | Status: DC | PRN

## 2018-03-29 MED ORDER — DIAZEPAM 5 MG PO TABS: 5.0000 mg | ORAL_TABLET | Freq: Two times a day (BID) | ORAL | Status: DC | PRN

## 2018-03-29 MED ORDER — POLYETHYLENE GLYCOL 3350 17 G PO PACK: 17.0000 g | PACK | Freq: Every day | ORAL | Status: DC | PRN

## 2018-03-29 NOTE — ED Triage Notes (Signed)
Pt brought from home after having a nosebleed that won't stop. Pt states this has happened 3 nights in a row but tonight was unable to stop.EMS administered 2 sprays of Afrin with no relief. +blood thinners

## 2018-03-29 NOTE — Progress Notes (Signed)
Pt had an episode of medium sized stool that was dark red in color, type 6 consistency.  [facility] Raliegh Ip Schorr notified.  Will continue to monitor.

## 2018-03-29 NOTE — H&P (Signed)
History and Physical    [name] [name] OEV:[id number] DOB: [birth year] DOA: 03/29/2018  PCP: [name], MD   Patient coming from: Home  Chief Complaint: Nose Bleeding  HPI: [name] [name] is a 84 y.o. female with medical history significant of medical comorbidities including arthritis, history of breast cancer and carcinoid tumor, chronic atrial fibrillation with chads vascular 6, history of CVA, depression, diabetes mellitus type 2, hypertension, hyperlipidemia, heart murmur, hypothyroidism, history of intermittent vertigo, pulmonary hypertension, Wegener's granulomatosis as well as other comorbidities who presents with a chief complaint of nose bleeding from her right nare. Started Bleeding 3 Days and has been intermittent. Starting and Stopping and worse at night and started again 4:00 AM this Morning. No nasal spray utilized. Stopped Eliquis about a week ago when she was having hemopytsis. States head has been hurting but improved with Nasal Packing. Coughed up some blood this AM but not since this AM. No  CP/SOB/Lightheadedness. No Nausea or Vomiting.  Denies any other complaints or concerns.  The right nare was packed in the ED and Dr. Vallery Ridge discussed with Dr. Wilburn Cornelia of ENT who recommended the patient be transferred to [facility] for further evaluation recommendations in case she needs interventional angiography or procedures that can be done there.  H was called admit this patient for epistaxis and she will be transferred to [facility].  ED Course: In the ED she had basic blood work done as well as nasal packing.  She is found to have worsening hemoglobin so I have ordered 1 unit of to be transfused.  ENT was called by the EDP and Dr. Wilburn Cornelia request the patient be transferred to [facility] for further evaluation recommendations.  Review of Systems: As per HPI otherwise 10 point review of systems negative.   Past Medical History:  Diagnosis Date  . Arthritis    r tkr  .  Breast cancer ([place])   . Chronic atrial fibrillation    CHADS2-VASc=6.  on low dose Eliquis (corrected for age & renal fxn)  . CVA (cerebral vascular accident) ([place]) 2008   mini stroke.no residual  . DEPRESSION 05/16/2009  . Diabetes mellitus type II   . Heart murmur    stress test 2009.dr peter [place]  . HYPERLIPIDEMIA 07/24/2008  . HYPERTENSION 07/24/2008  . HYPOTHYROIDISM 07/24/2008  . Intermittent vertigo   . Metastatic carcinoid tumor to intra-abdominal site ([place])   . Pneumonia   . Pulmonary hypertension ([place])    Mod PHTN on Echo 03/2016: 2/2 combination of Wegener's, HFPF & Afib.  . Skin abnormality    facial lesions .pt applying mupiracin to areas  . Vertigo   . Wegener's disease, pulmonary ([place]) 10/2012   Past Surgical History:  Procedure Laterality Date  . BREAST SURGERY  2007   Lumpectomy, XRT 2006.l breast  . CHOLECYSTECTOMY  1980  . EXPLORATORY LAPAROTOMY    . HEEL SPUR SURGERY Right   . JOINT REPLACEMENT     r knee  . KNEE SURGERY  2009   TKR  . TEE WITHOUT CARDIOVERSION N/A 09/16/2017   Procedure: TRANSESOPHAGEAL ECHOCARDIOGRAM (TEE);  Surgeon: Larey Dresser, MD;  Location: [facility] ENDOSCOPY;  Service: Cardiovascular;  Laterality: N/A;  . TRANSTHORACIC ECHOCARDIOGRAM  03/2016   Afib (no Diastolic Fxn). EF 55-60%. No RWMA. Mild Aortic dilation Mild MR. Severe LA dilation. Mod RA dilation.  Mod TR with Mod-Severe Pulmonary HTN - but normal RV function..  . VENTRAL HERNIA REPAIR N/A 07/01/2016   Procedure: REPAIR OF  INCARCERATED INCISIONAL HERNIA ;  Surgeon: Stark Klein, MD;  Location: [facility] ORS;  Service: General;  Laterality: N/A;  . VIDEO ASSISTED THORACOSCOPY  04/22/2011   Procedure: VIDEO ASSISTED THORACOSCOPY;  Surgeon: Melrose Nakayama, MD;  Location: [place];  Service: Thoracic;  Laterality: Left;  WITH BIOPSY   SOCIAL HISTORY  reports that she quit smoking about 35 years ago. Her smoking use included cigarettes. She has a 6.00 pack-year smoking history. She has  never used smokeless tobacco. She reports that she does not drink alcohol or use drugs.  Allergies  Allergen Reactions  . Codeine Sulfate Other (See Comments)    REACTION: GI upset  . Sulfonamide Derivatives Other (See Comments)    REACTION: GI upset  . Vancomycin Other (See Comments)    Red man syndrome  . Atarax [Hydroxyzine] Nausea Only and Rash   Family History  Problem Relation Age of Onset  . Arthritis Mother   . Rheum arthritis Mother   . Heart disease Father   . Coronary artery disease Father   . Cancer Sister        breast CA, both sisters  . Breast cancer Sister   . Coronary artery disease Brother   . Lung cancer Brother    Prior to Admission medications   Medication Sig Start Date End Date Taking? Authorizing Provider  acetaminophen (TYLENOL) 500 MG tablet Take 1,000 mg by mouth every 6 (six) hours as needed for headache.    Yes [provider]  amLODipine (NORVASC) 5 MG tablet Take 5 mg by mouth at bedtime.  09/25/17  Yes [provider]  BYSTOLIC 10 MG tablet TAKE 1 TABLET BY MOUTH ONCE DAILY AFTER  BREAKFAST 02/22/18  Yes Burchette, Alinda Sierras, MD  diazepam (VALIUM) 5 MG tablet TAKE 1 TABLET BY MOUTH EVERY 12 HOURS AS NEEDED FOR SEVERE VERTIGO FLARES. AVOID REGULAR USE. 11/02/17  Yes Burchette, Alinda Sierras, MD  feeding supplement, ENSURE ENLIVE, (ENSURE ENLIVE) LIQD Take 237 mLs by mouth 2 (two) times daily between meals. 09/18/17  Yes Debbe Odea, MD  furosemide (LASIX) 20 MG tablet Take 1 tablet (20 mg total) by mouth daily. Patient taking differently: Take 20 mg by mouth daily as needed for edema.  03/23/18  Yes Eugenie Filler, MD  gabapentin (NEURONTIN) 100 MG capsule TAKE 1 CAPSULE BY MOUTH TWICE DAILY BEFORE MEAL(S) 10/29/17  Yes Burchette, Alinda Sierras, MD  glimepiride (AMARYL) 2 MG tablet TAKE ONE TABLET BY MOUTH ONCE DAILY BEFORE BREAKFAST Patient taking differently: Take 2 mg by mouth daily as needed (high blood sugar).  10/07/16  Yes Burchette, Alinda Sierras, MD  guaiFENesin (MUCINEX) 600 MG 12 hr tablet Take 1 tablet (600 mg total) by mouth 2 (two) times daily as needed for cough. 09/17/17  Yes Rizwan, Eunice Blase, MD  levothyroxine (SYNTHROID, LEVOTHROID) 100 MCG tablet TAKE 1 TABLET BY MOUTH ONCE DAILY BEFORE BREAKFAST. 07/27/17  Yes Burchette, Alinda Sierras, MD  loratadine (CLARITIN) 10 MG tablet Take 10 mg by mouth daily.   Yes [provider]  Multiple Vitamins-Minerals (CENTRUM SILVER 50+WOMEN) TABS Take 1 tablet by mouth daily.    Yes [provider]  ondansetron (ZOFRAN-ODT) 4 MG disintegrating tablet Take 1 tablet (4 mg total) by mouth every 8 (eight) hours as needed for nausea or vomiting. 09/18/17  Yes Burchette, Alinda Sierras, MD  oxymetazoline (AFRIN) 0.05 % nasal spray Place 1 spray into both nostrils 2 (two) times daily as needed for congestion.   Yes [provider]  predniSONE (DELTASONE) 20 MG tablet Take 2 tablets (40 mg total) by mouth daily with breakfast. 03/22/18  Yes Eugenie Filler, MD  famotidine (PEPCID) 10 MG tablet Take 1 tablet (10 mg total) by mouth 2 (two) times daily. 03/21/18   Eugenie Filler, MD   Physical Exam: Vitals:   03/29/18 1030 03/29/18 1100 03/29/18 1130 03/29/18 1200  BP: 137/60 (!) 157/83 138/60 (!) 158/72  Pulse: (!) 58 75 75 (!) 55  Resp: (!) 22 (!) 27 (!) 24 (!) 25  Temp:      TempSrc:      SpO2: 98% 99% 98% 99%  Weight:      Height:       Constitutional: WN/WD pleasant elderly Caucasian female in NAD and appears calm and comfortable Eyes: Lids and conjunctivae normal, sclerae anicteric  ENMT: External Ears appear normal. Right Nare is packed. Grossly normal hearing. Neck: Appears normal, supple, no cervical masses, normal ROM, no appreciable thyromegaly; no JVD Respiratory: Diminished to auscultation bilaterally, no wheezing, rales, rhonchi or crackles. Normal respiratory effort and patient is not tachypenic. No accessory muscle use. Not wearing any supplemental O2 via  [place] [facility]: Irregularly Irregular. Has a 2/6 murmur. S1 and S2 auscultated. Trace to 1+ LE extremity edema. Abdomen: Soft, non-tender, non-distended. No masses palpated. No appreciable hepatosplenomegaly. Bowel sounds positive x4.  GU: Deferred. Musculoskeletal: No clubbing / cyanosis of digits/nails.  Skin: No rashes, lesions, ulcers on a limited skin evaluation. No induration; Warm and dry.  Neurologic: CN 2-12 grossly intact with no focal deficits. Romberg sign and cerebellar reflexes not assessed.  Psychiatric: Normal judgment and insight. Alert and oriented x 3. Slightly anxious mood and appropriate affect.   Labs on Admission: I have personally reviewed following labs and imaging studies  CBC: Recent Labs  Lab 03/29/18 0903  WBC 16.4*  HGB 6.8*  HCT 21.4*  MCV 97.3  PLT 854   Basic Metabolic Panel: Recent Labs  Lab 03/29/18 0903  NA 133*  K 4.5  CL 99  CO2 25  GLUCOSE 151*  BUN 50*  CREATININE 1.64*  CALCIUM 8.3*   GFR: Estimated Creatinine Clearance: 24.3 mL/min (A) (by C-G formula based on SCr of 1.64 mg/dL (H)). Liver Function Tests: No results for input(s): AST, ALT, ALKPHOS, BILITOT, PROT, ALBUMIN in the last 168 hours. No results for input(s): LIPASE, AMYLASE in the last 168 hours. No results for input(s): AMMONIA in the last 168 hours. Coagulation Profile: Recent Labs  Lab 03/29/18 0903  INR 1.14   Cardiac Enzymes: No results for input(s): CKTOTAL, CKMB, CKMBINDEX, TROPONINI in the last 168 hours. BNP (last 3 results) Recent Labs    03/22/18 1056  PROBNP 1,910.0*   HbA1C: No results for input(s): HGBA1C in the last 72 hours. CBG: No results for input(s): GLUCAP in the last 168 hours. Lipid Profile: No results for input(s): CHOL, HDL, LDLCALC, TRIG, CHOLHDL, LDLDIRECT in the last 72 hours. Thyroid Function Tests: No results for input(s): TSH, T4TOTAL, FREET4, T3FREE, THYROIDAB in the last 72 hours. Anemia Panel: No results for  input(s): VITAMINB12, FOLATE, FERRITIN, TIBC, IRON, RETICCTPCT in the last 72 hours. Urine analysis:    Component Value Date/Time   COLORURINE YELLOW 03/19/2018 [place] 03/19/2018 0937   LABSPEC 1.014 03/19/2018 0937   PHURINE 5.0 03/19/2018 0937   GLUCOSEU 50 (A) 03/19/2018 0937   HGBUR MODERATE (A) 03/19/2018 0937   BILIRUBINUR NEGATIVE 03/19/2018 0937   BILIRUBINUR n 12/30/2010 1700   KETONESUR NEGATIVE  03/19/2018 0937   PROTEINUR 100 (A) 03/19/2018 0937   UROBILINOGEN 1.0 09/25/2014 1550   NITRITE NEGATIVE 03/19/2018 0937   LEUKOCYTESUR NEGATIVE 03/19/2018 0937   Sepsis Labs: !!!!!!!!!!!!!!!!!!!!!!!!!!!!!!!!!!!!!!!!!!!! _0 (procalcitonin:4,lacticidven:4) )No results found for this or any previous visit (from the past 240 hour(s)).   Radiological Exams on Admission: No results found.  EKG: Not done in the ED on Admission so will do one now.   Assessment/Plan Active Problems:   Hypothyroidism   Hyponatremia   Essential hypertension   Breast cancer (HCC)   Anemia   Wegener's granulomatosis (granulomatosis with polyangiitis) (HCC)   Pulmonary hypertension (HCC)   Chronic kidney disease (CKD), stage IV (severe) (HCC)   Normocytic anemia   Diastolic CHF, chronic (HCC) - on low dose Lasix. ARB & BB along with Norvasc   Chronic atrial fibrillation (HCC) CHADS2-VASc=6.  On Corrected "low" dose of Eliquis   Type 2 diabetes mellitus with neurological complications (HCC)   Epistaxis  Acute Epistaxis -Place in Obs Telemetry -Has been having Epistaxis the last 3 nights intermittently -Was told to Discontinue Eliquis and did that abotu a week ago -Transfer to [facility] at ENT Request -C/w Nasal Packing -C/w Afrin -Further Care and Evaluation by ENT Dr. Wilburn Cornelia  Leukocytosis -In the setting of Steroid Demargination as she takes Prednisone 40 mg Daily -Patient's WBC went from 7.0 -> 16.4 -Continue to monitor for signs and symptoms of  infection -Repeat CBC in a.m.  Normocytic Anemia/Acute Blood Loss Anemia -Patient's Hb/HCt on Admission was 6.8/21.4 -Type and Screen and Transfuse 1 unit -Prior to Transfusion check Anemia Panel  -Continue to Monitor for S/Sx of Bleeding as patient is having epistaxis  -Repeat H/H after Blood Transfusion and repeat CBC in AM  Chronic Atrial Fibrillation -C/w Bystolic 10 mg po Daily; remains Rate Controlled -Eliquis was discontinued last week by PCP due to epistaxis -CHA2DS2-VASc of at least 6 -Was discharged off her anticoagulation last hospitalization and resumed but then was told to hold again -We will hold for now and defer resumption once epistaxis is improved and stopped  Hyponatremia -Mild at 133 -Continue to Monitor and repeat CMP in AM   Chronic Respiratory Failure with Hypoxia 2/2 Wegeners Granulomatosis (Granulomatosis with Polyangiitis) -Wears 2 Liters qHS but not now -Recently Hospitialized becawuse of it  -States she had High Dose IV Pulse Steroids as well as Rituximab -Continue monitor respiratory status carefully and provide supplemental oxygen via nasal cannula if needed  CKD Stage 4 -Improving and BUN/Cr is better than previous his BUN/creatinine was 50/1.64 -Baseline Cr is 1.7 to 2.1 -Continue to monitor renal function trend and avoid nephrotoxic medication and contrast dye as a possible -Repeat CMP in AM   Type 2 Diabetes with Neuropathy -Holding her Glimepiride 2 mg po Daily but will continue her Gabapentin 100 mg po BID -Place on Sensitive Novolog SSI AC as patient is also going to continue her Steroids -Continue to Monitor CBG's carefully -Blood Sugar on Admission BMP was 151 -Last HbA1c 12/22/17 was 6.3 and consider repeating if Blood Sugars are uncontrolled  Chronic Diastolic CHF -Currently not decompensated but has trace LE Edema- -Takes Lasix 20 mg po Dailyprn for Edema and did not take yesterday -Will give IV Lasix 20 mg x1 after blood  administration today -Strict I's/O's, Daily Weights -C/w Bystolic 10 mg po Daily -Continue to Monitor Volume Status Carefully  HTN -C/w Amlodipine 5 mg po QHS and Bystolic 10 mg po Daily   HLD -Not on any medications via [facility]  and patient denies taking any  Hypothyroidism -C/w 100 mcg of Leovthyroxine Daily   History of carcinoid tumor/History of breast cancer -Outpatient follow-up with Oncology  DVT prophylaxis: SCDs Code Status: FULL CODE Family Communication: No family present at bedside  Disposition Plan: [facility] to [facility] for Further Evaluation and Recommendations Consults called: EDP called ENT Dr. Wilburn Cornelia Admission status: Place in Obs Telemetry  Severity of Illness: The appropriate patient status for this patient is OBSERVATION. Observation status is judged to be reasonable and necessary in order to provide the required intensity of service to ensure the patient's safety. The patient's presenting symptoms, physical exam findings, and initial radiographic and laboratory data in the context of their medical condition is felt to place them at decreased risk for further clinical deterioration. Furthermore, it is anticipated that the patient will be medically stable for discharge from the hospital within 2 midnights of admission. The following factors support the patient status of observation.   " The patient's presenting symptoms include nosebleeding. " The physical exam findings include A Fib " The initial radiographic and laboratory data are showing an acute drop in Hb.  Kerney Elbe, D.O. [facility] P[facility]ER is on [place]  If 7PM-7AM, please contact night-coverage [email or web address] Password [facility]  03/29/2018, 12:23 PM

## 2018-03-29 NOTE — ED Notes (Signed)
Bed: WA21 Expected date:  Expected time:  Means of arrival:  Comments: 25F Epistaxis

## 2018-03-29 NOTE — ED Provider Notes (Addendum)
[facility] DEPT Provider Note   CSN: [id number] Arrival date & time: 03/29/18  9826     History   Chief Complaint Chief Complaint  Patient presents with  . Epistaxis    HPI [name] [name] is a 84 y.o. female.  HPI Patient has had nosebleed for the past 3 nights.  She reports that has been bleeding pretty badly but she was hoping it would stop.  Tonight it just kept bleeding and she was passing large clots.  Blood is been coming from the right nare.  She denies she has been dizzy or lightheaded.  Patient just recently discontinued Eliquis 2 days ago.  She reports that was instructed by her physician.  Patient has chronic atrial fibrillation.  Patient was just recently hospitalized for an respiratory failure due to Wegener's granulomatosis.  She is wearing home oxygen.  Patient denies she is ever had problems with nosebleeds previously.   hospitalization. Past Medical History:  Diagnosis Date  . Arthritis    r tkr  . Breast cancer ([place])   . Chronic atrial fibrillation    CHADS2-VASc=6.  on low dose Eliquis (corrected for age & renal fxn)  . CVA (cerebral vascular accident) ([place]) 2008   mini stroke.no residual  . DEPRESSION 05/16/2009  . Diabetes mellitus type II   . Heart murmur    stress test 2009.dr peter [place]  . HYPERLIPIDEMIA 07/24/2008  . HYPERTENSION 07/24/2008  . HYPOTHYROIDISM 07/24/2008  . Intermittent vertigo   . Metastatic carcinoid tumor to intra-abdominal site ([place])   . Pneumonia   . Pulmonary hypertension ([place])    Mod PHTN on Echo 03/2016: 2/2 combination of Wegener's, HFPF & Afib.  . Skin abnormality    facial lesions .pt applying mupiracin to areas  . Vertigo   . Wegener's disease, pulmonary ([place]) 10/2012    Patient Active Problem List   Diagnosis Date Noted  . Acute renal failure superimposed on stage 4 chronic kidney disease ([place]) 03/20/2018  . Pneumonia 03/15/2018  . Community acquired pneumonia 09/17/2017  . [place]  [facility]- [facility] 09/16/2017  . Acute on chronic respiratory failure with hypoxia ([place]) 09/29/2016  . Malnutrition of moderate degree 07/01/2016  . Incarcerated hernia 06/30/2016  . Recurrent umbilical hernia with incarceration 06/30/2016  . Cellulitis and abscess   . Diabetic foot ulcer ([place]) 12/10/2015  . Hereditary and idiopathic peripheral neuropathy 09/25/2014  . Dependent edema 09/25/2014  . UTI (lower urinary tract infection)   . Type 2 diabetes mellitus with neurological complications ([place]) 41/58/3094  . Chronic atrial fibrillation (HCC) CHADS2-VASc=6.  On Corrected "low" dose of Eliquis 04/04/2014  . CKD (chronic kidney disease) stage 3, GFR 30-59 ml/min (HCC) 04/04/2014  . Abdominal hernia without obstruction or gangrene 01/09/2014  . Nausea alone 08/21/2013  . Diastolic CHF, chronic (HCC) - on low dose Lasix. ARB & BB along with Norvasc 08/18/2013  . Sepsis ([place]) 08/18/2013  . Hand pain, left 08/18/2013  . Chronic kidney disease (CKD), stage IV (severe) ([place]) 05/08/2013  . Normocytic anemia 05/08/2013  . Pulmonary hypertension ([place]) 05/07/2013  . Right-sided heart failure ([place]) 05/07/2013  . Pancreatic cyst 04/17/2013  . Acute respiratory failure with hypoxemia ([place]) 04/13/2013  . Obesity (BMI 30-39.9) 12/28/2012  . Wegener's granulomatosis (granulomatosis with polyangiitis) ([place]) 11/02/2012  . Cough with hemoptysis 11/01/2012  . Anemia 11/01/2012  . Dehydration 11/01/2012  . Diffuse pulmonary alveolar hemorrhage 11/01/2012  . Breast cancer ([place]) 09/14/2012  . Pruritus of skin 05/20/2012  . Carcinoid  tumor of colon, malignant ([place]) 05/16/2011  . Cryptogenic organizing pneumonia 04/05/2011  . Vertigo, intermittent 10/21/2010  . Edema 03/25/2010  . Hyponatremia 02/13/2010  . DEPRESSION 05/16/2009  . Hypothyroidism 07/24/2008  . Hyperlipidemia 07/24/2008  . Essential hypertension 07/24/2008  . OSTEOARTHRITIS 07/24/2008    Past Surgical History:  Procedure  Laterality Date  . BREAST SURGERY  2007   Lumpectomy, XRT 2006.l breast  . CHOLECYSTECTOMY  1980  . EXPLORATORY LAPAROTOMY    . HEEL SPUR SURGERY Right   . JOINT REPLACEMENT     r knee  . KNEE SURGERY  2009   TKR  . TEE WITHOUT CARDIOVERSION N/A 09/16/2017   Procedure: TRANSESOPHAGEAL ECHOCARDIOGRAM (TEE);  Surgeon: Larey Dresser, MD;  Location[facility] ENDOSCOPY;  Service: Cardiovascular;  Laterality: N/A;  . TRANSTHORACIC ECHOCARDIOGRAM  03/2016   Afib (no Diastolic Fxn). EF 55-60%. No RWMA. Mild Aortic dilation Mild MR. Severe LA dilation. Mod RA dilation.  Mod TR with Mod-Severe Pulmonary HTN - but normal RV function..  . VENTRAL HERNIA REPAIR N/A 07/01/2016   Procedure: REPAIR OF INCARCERATED INCISIONAL HERNIA ;  Surgeon: Stark Klein, MD;  Location: [facility] ORS;  Service: General;  Laterality: N/A;  . VIDEO ASSISTED THORACOSCOPY  04/22/2011   Procedure: VIDEO ASSISTED THORACOSCOPY;  Surgeon: Melrose Nakayama, MD;  Location: [place];  Service: Thoracic;  Laterality: Left;  WITH BIOPSY     OB History   No obstetric history on file.      Home Medications    Prior to Admission medications   Medication Sig Start Date End Date Taking? Authorizing Provider  acetaminophen (TYLENOL) 500 MG tablet Take 1,000 mg by mouth every 6 (six) hours as needed for headache.    Yes [provider]  amLODipine (NORVASC) 5 MG tablet Take 5 mg by mouth at bedtime.  09/25/17  Yes [provider]  BYSTOLIC 10 MG tablet TAKE 1 TABLET BY MOUTH ONCE DAILY AFTER  BREAKFAST 02/22/18  Yes Burchette, Alinda Sierras, MD  diazepam (VALIUM) 5 MG tablet TAKE 1 TABLET BY MOUTH EVERY 12 HOURS AS NEEDED FOR SEVERE VERTIGO FLARES. AVOID REGULAR USE. 11/02/17  Yes Burchette, Alinda Sierras, MD  feeding supplement, ENSURE ENLIVE, (ENSURE ENLIVE) LIQD Take 237 mLs by mouth 2 (two) times daily between meals. 09/18/17  Yes Debbe Odea, MD  furosemide (LASIX) 20 MG tablet Take 1 tablet (20 mg total) by mouth daily. Patient  taking differently: Take 20 mg by mouth daily as needed for edema.  03/23/18  Yes Eugenie Filler, MD  gabapentin (NEURONTIN) 100 MG capsule TAKE 1 CAPSULE BY MOUTH TWICE DAILY BEFORE MEAL(S) 10/29/17  Yes Burchette, Alinda Sierras, MD  glimepiride (AMARYL) 2 MG tablet TAKE ONE TABLET BY MOUTH ONCE DAILY BEFORE BREAKFAST Patient taking differently: Take 2 mg by mouth daily as needed (high blood sugar).  10/07/16  Yes Burchette, Alinda Sierras, MD  guaiFENesin (MUCINEX) 600 MG 12 hr tablet Take 1 tablet (600 mg total) by mouth 2 (two) times daily as needed for cough. 09/17/17  Yes Rizwan, Eunice Blase, MD  levothyroxine (SYNTHROID, LEVOTHROID) 100 MCG tablet TAKE 1 TABLET BY MOUTH ONCE DAILY BEFORE BREAKFAST. 07/27/17  Yes Burchette, Alinda Sierras, MD  loratadine (CLARITIN) 10 MG tablet Take 10 mg by mouth daily.   Yes [provider]  Multiple Vitamins-Minerals (CENTRUM SILVER 50+WOMEN) TABS Take 1 tablet by mouth daily.    Yes [provider]  ondansetron (ZOFRAN-ODT) 4 MG disintegrating tablet Take 1 tablet (4 mg total) by mouth  every 8 (eight) hours as needed for nausea or vomiting. 09/18/17  Yes Burchette, Alinda Sierras, MD  oxymetazoline (AFRIN) 0.05 % nasal spray Place 1 spray into both nostrils 2 (two) times daily as needed for congestion.   Yes [provider]  predniSONE (DELTASONE) 20 MG tablet Take 2 tablets (40 mg total) by mouth daily with breakfast. 03/22/18  Yes Eugenie Filler, MD  famotidine (PEPCID) 10 MG tablet Take 1 tablet (10 mg total) by mouth 2 (two) times daily. 03/21/18   Eugenie Filler, MD    Family History Family History  Problem Relation Age of Onset  . Arthritis Mother   . Rheum arthritis Mother   . Heart disease Father   . Coronary artery disease Father   . Cancer Sister        breast CA, both sisters  . Breast cancer Sister   . Coronary artery disease Brother   . Lung cancer Brother     Social History Social History   Tobacco Use  . Smoking status: Former  Smoker    Packs/day: 0.50    Years: 12.00    Pack years: 6.00    Types: Cigarettes    Last attempt to quit: 06/04/1982    Years since quitting: 35.8  . Smokeless tobacco: Never Used  Substance Use Topics  . Alcohol use: No  . Drug use: No     Allergies   Codeine sulfate; Sulfonamide derivatives; Vancomycin; and Atarax [hydroxyzine]   Review of Systems Review of Systems 10 Systems reviewed and are negative for acute change except as noted in the HPI.   Physical Exam Updated Vital Signs BP (!) 145/65   Pulse 70   Temp (!) 97.4 F (36.3 C) (Oral)   Resp (!) 22   Ht _0  (1.676 m)   Wt 64.4 kg   SpO2 100%   BMI 22.92 kg/m   Physical Exam Constitutional:      Comments: Patient is alert and appropriate.  She does not have acute respiratory distress.  She has active bleeding from the right nare.  HENT:     Head:     Comments: Patient has active continuous bleeding from the right nare.  She is coughing out large clots from the posterior oropharynx.  After all clot cleared, patient continued having red blood filling the nasal passage.  I could not identify exact location of bleeding.  Blood and clot hanging from posterior oropharynx. Eyes:     Extraocular Movements: Extraocular movements intact.  Cardiovascular:     Rate and Rhythm: Normal rate. Rhythm irregular.  Pulmonary:     Effort: Pulmonary effort is normal.     Comments: Crackles in both lung fields. Skin:    General: Skin is warm and dry.  Neurological:     General: No focal deficit present.     Mental Status: She is oriented to person, place, and time.  Psychiatric:        Mood and Affect: Mood normal.        Behavior: Behavior normal.      ED Treatments / Results  Labs (all labs ordered are listed, but only abnormal results are displayed) Labs Reviewed  BASIC METABOLIC PANEL - Abnormal; Notable for the following components:      Result Value   Sodium 133 (*)    Glucose, Bld 151 (*)    BUN 50 (*)      Creatinine, Ser 1.64 (*)    Calcium 8.3 (*)  GFR calc non Af Amer 29 (*)    GFR calc Af Amer 33 (*)    All other components within normal limits  CBC - Abnormal; Notable for the following components:   WBC 16.4 (*)    RBC 2.20 (*)    Hemoglobin 6.8 (*)    HCT 21.4 (*)    All other components within normal limits  PROTIME-INR    EKG None  Radiology No results found.  Procedures .Epistaxis Management Date/Time: 03/29/2018 7:56 AM Performed by: Charlesetta Shanks, MD Authorized by: Charlesetta Shanks, MD   Consent:    Consent obtained:  Verbal   Consent given by:  Patient   Risks discussed:  Bleeding, nasal injury and pain Anesthesia (see MAR for exact dosages):    Anesthesia method:  None Procedure details:    Treatment site:  R posterior   Treatment method:  Nasal balloon   Treatment complexity:  Extensive   Treatment episode: initial   Post-procedure details:    Assessment:  Bleeding stopped   Patient tolerance of procedure:  Tolerated well, no immediate complications Comments:     All clots cleared.  Patient could pass air through the nasal passage without obstruction.  She did have continued active bleeding after clots cleared.  Anterior posterior nasal balloon passed.  Posterior balloon inflated to 7 cc.  Anterior balloon inflated to 8 cc.  Patient had some discomfort with this but reports the pain is tolerable.  Bleeding slowed considerably with a trickle.  With recheck in approximately 15 minutes patient was not experiencing ongoing bleeding.  She did not perceive blood in the back of her throat.  ENT consulted.   (including critical care time)  Medications Ordered in ED Medications  morphine 2 MG/ML injection 2 mg (2 mg Intravenous Refused 03/29/18 0905)  oxymetazoline (AFRIN) 0.05 % nasal spray 1 spray (1 spray Each Nare Given 03/29/18 0815)     Initial Impression / Assessment and Plan / ED Course  I have reviewed the triage vital signs and the nursing  notes.  Pertinent labs & imaging results that were available during my care of the patient were reviewed by me and considered in my medical decision making (see chart for details).  Clinical Course as of Mar 29 1017  Mon Mar 29, 2018  8333 Consult: Reviewed with Dr. Wilburn Cornelia ENT.  Advises patient be transferred to [facility] so if interventional angiography or procedures needed can be done there.   [MP]  [phone] Consult: [facility] hospitalist Dr. Shelton Silvas.  Requests to be call back when some labs have resulted to initiate transfer to [facility].   [MP]    Clinical Course User Index [MP] Charlesetta Shanks, MD   Patient presents with a posterior nosebleed.  She had been on Eliquis although reports she stopped it within the past 2 days.  Eliquis is for chronic atrial fibrillation.  Patient is in atrial fibrillation rate controlled.  Patient does not complain of generalized lightheadedness or near syncope.  She is anemic at 6.8.  Prehospitalization anemia from last admission at was in the 7.0-7.8 range.  Patient does not have clinically symptomatic anemia at this time.  Her heart rate and blood pressure are stable.  Patient however has posterior packing in place with risk of associated rebleeding.  Patient will need admission for observation.  ENT has been consulted.  Patient has significant comorbid illness with previous pneumonia and Wegener's granulomatosis requiring intubation this month.   Final Clinical Impressions(s) / ED Diagnoses  Final diagnoses:  Acute posterior epistaxis  Severe comorbid illness    ED Discharge Orders    None       Charlesetta Shanks, MD 03/29/18 1021    Charlesetta Shanks, MD 03/29/18 1030

## 2018-03-29 NOTE — ED Notes (Signed)
Informed Dr. Colvin Caroli of critical lab result Hgb 6.8 via secure chat

## 2018-03-30 ENCOUNTER — Telehealth: Payer: Self-pay | Admitting: *Deleted

## 2018-03-30 DIAGNOSIS — R040 Epistaxis: Principal | ICD-10-CM

## 2018-03-30 DIAGNOSIS — D649 Anemia, unspecified: Secondary | ICD-10-CM | POA: Diagnosis not present

## 2018-03-30 LAB — OCCULT BLOOD X 1 CARD TO LAB, STOOL: Fecal Occult Bld: POSITIVE — AB

## 2018-03-30 LAB — BPAM RBC
Blood Product Expiration Date: 202001042359
ISSUE DATE / TIME: 201912231840
Unit Type and Rh: 6200

## 2018-03-30 LAB — TYPE AND SCREEN
ABO/RH(D): A POS
Antibody Screen: NEGATIVE
Unit division: 0

## 2018-03-30 LAB — COMPREHENSIVE METABOLIC PANEL
ALT: 18 U/L (ref 0–44)
AST: 22 U/L (ref 15–41)
Albumin: 2.9 g/dL — ABNORMAL LOW (ref 3.5–5.0)
Alkaline Phosphatase: 40 U/L (ref 38–126)
Anion gap: 10 (ref 5–15)
BUN: 44 mg/dL — ABNORMAL HIGH (ref 8–23)
CO2: 25 mmol/L (ref 22–32)
Calcium: 8.2 mg/dL — ABNORMAL LOW (ref 8.9–10.3)
Chloride: 101 mmol/L (ref 98–111)
Creatinine, Ser: 1.83 mg/dL — ABNORMAL HIGH (ref 0.44–1.00)
GFR calc Af Amer: 29 mL/min — ABNORMAL LOW (ref >60)
GFR calc non Af Amer: 25 mL/min — ABNORMAL LOW (ref >60)
Glucose, Bld: 100 mg/dL — ABNORMAL HIGH (ref 70–99)
Potassium: 4.2 mmol/L (ref 3.5–5.1)
Sodium: 136 mmol/L (ref 135–145)
Total Bilirubin: 1.7 mg/dL — ABNORMAL HIGH (ref 0.3–1.2)
Total Protein: 5.4 g/dL — ABNORMAL LOW (ref 6.5–8.1)

## 2018-03-30 LAB — CBC
HCT: 23.6 % — ABNORMAL LOW (ref 36.0–46.0)
Hemoglobin: 7.7 g/dL — ABNORMAL LOW (ref 12.0–15.0)
MCH: 30.8 pg (ref 26.0–34.0)
MCHC: 32.6 g/dL (ref 30.0–36.0)
MCV: 94.4 fL (ref 80.0–100.0)
Platelets: 175 10*3/uL (ref 150–400)
RBC: 2.5 MIL/uL — ABNORMAL LOW (ref 3.87–5.11)
RDW: 14.5 % (ref 11.5–15.5)
WBC: 10.7 10*3/uL — ABNORMAL HIGH (ref 4.0–10.5)
nRBC: 0.2 % (ref 0.0–0.2)

## 2018-03-30 LAB — HEMOGLOBIN AND HEMATOCRIT, BLOOD
HCT: 24 % — ABNORMAL LOW (ref 36.0–46.0)
Hemoglobin: 7.9 g/dL — ABNORMAL LOW (ref 12.0–15.0)

## 2018-03-30 LAB — GLUCOSE, CAPILLARY: Glucose-Capillary: 103 mg/dL — ABNORMAL HIGH (ref 70–99)

## 2018-03-30 NOTE — Progress Notes (Signed)
Dana from blood blank notified me that typing screen was done here at [facility].

## 2018-03-30 NOTE — Discharge Instructions (Signed)
Follow with Primary MD [name], MD in 3 days   Get CBC, CMP checked  by Primary MD   in 3 days   Activity: As tolerated with Full fall precautions use walker/cane & assistance as needed  Disposition Home    Diet: Heart Healthy   Low Carb.  Check CBGs QA CHS  Special Instructions: If you have smoked or chewed Tobacco  in the last 2 yrs please stop smoking, stop any regular Alcohol  and or any Recreational drug use.  On your next visit with your primary care physician please Get Medicines reviewed and adjusted.  Please request your Prim.MD to go over all Hospital Tests and Procedure/Radiological results at the follow up, please get all Hospital records sent to your Prim MD by signing hospital release before you go home.  If you experience worsening of your admission symptoms, develop shortness of breath, life threatening emergency, suicidal or homicidal thoughts you must seek medical attention immediately by calling 911 or calling your MD immediately  if symptoms less severe.  You Must read complete instructions/literature along with all the possible adverse reactions/side effects for all the Medicines you take and that have been prescribed to you. Take any new Medicines after you have completely understood and accpet all the possible adverse reactions/side effects.

## 2018-03-30 NOTE — Discharge Summary (Signed)
[name] [name] JYN:[id number] DOB: Nov 01, 1932 DOA: 03/29/2018  PCP: [name], MD  Admit date: 03/29/2018  Discharge date: 03/30/2018  Admitted From: Home   Disposition:  Home   Recommendations for Outpatient Follow-up:   Follow up with PCP in 1-2 weeks  PCP Please obtain BMP/CBC, 2 view CXR in 1week,  (see Discharge instructions)   PCP Please follow up on the following pending results:    Home Health: None   Equipment/Devices: none  Consultations: None Discharge Condition: Stable   CODE STATUS: Full   Diet Recommendation: Heart Healthy Low Carb     Chief Complaint  Patient presents with  . Epistaxis     Brief history of present illness from the day of admission and additional interim summary    [name] [name] is a 84 y.o. female with medical history significant of medical comorbidities including arthritis, history of breast cancer and carcinoid tumor, chronic atrial fibrillation with chads vascular 6, history of CVA, depression, diabetes mellitus type 2, hypertension, hyperlipidemia, heart murmur, hypothyroidism, history of intermittent vertigo, pulmonary hypertension, Wegener's granulomatosis as well as other comorbidities who presents with a chief complaint of nose bleeding from her right nare. Started Bleeding 3 Days and has been intermittent. Starting and Stopping and worse at night and started again 4:00 AM this Morning. No nasal spray utilized. Stopped Eliquis about a week ago when she was having hemopytsis/.  She basically had off-and-on right-sided nasal bleeding for which she came to the ER underwent right nasal packing in the ED after which bleeding stopped.  ENT was consulted who requested 23-hour observation under hospitalist.    Hospital Course    1.  Right nasal bleed/acute epistaxis. Causing acute bleed related anemia seen 1 unit of packed RBC given upon admission.  She underwent right-sided nasal packing in the ER after which all bleeding has resolved.  Her case was discussed with Dr. Valetta Mole ENT who requested 23-hour observation under hospitalist.  Patient has been stable now back to her baseline eager to go home.  Will be discharged with nasal packing with close outpatient PCP and ENT follow-up.  History of Wegener's, chronic A. fib with [place] vas 2 score of 6.  Note she was on Eliquis which was stopped by PCP a week ago defer resumption to PCP and primary cardiologist, DM type II.  Continue all other medications as before unchanged.  Chronic respiratory failure due to Wegener's on 2 L nasal cannula home oxygen CKD 4.  Stable.   Discharge diagnosis     Active Problems:   Hypothyroidism   Hyponatremia   Essential hypertension   Breast cancer (HCC)   Anemia   Wegener's granulomatosis (granulomatosis with polyangiitis) (HCC)   Pulmonary hypertension (HCC)   Chronic kidney disease (CKD), stage IV (severe) (HCC)   Normocytic anemia   Diastolic CHF, chronic (HCC) - on low dose Lasix. ARB & BB along with Norvasc   Chronic atrial fibrillation (HCC) CHADS2-VASc=6.  On Corrected "low" dose of Eliquis   Type 2 diabetes mellitus  with neurological complications [facility])   Epistaxis    Discharge instructions    Discharge Instructions    Discharge instructions   Complete by:  As directed    Follow with Primary MD [name], MD in 3 days   Get CBC, CMP checked  by Primary MD   in 3 days   Activity: As tolerated with Full fall precautions use walker/cane & assistance as needed  Disposition Home    Diet: Heart Healthy  Low Carb.  Check CBGs QA CHS  Special Instructions: If you have smoked or chewed Tobacco  in the last 2 yrs please stop smoking, stop any regular Alcohol  and or any Recreational drug  use.  On your next visit with your primary care physician please Get Medicines reviewed and adjusted.  Please request your Prim.MD to go over all Hospital Tests and Procedure/Radiological results at the follow up, please get all Hospital records sent to your Prim MD by signing hospital release before you go home.  If you experience worsening of your admission symptoms, develop shortness of breath, life threatening emergency, suicidal or homicidal thoughts you must seek medical attention immediately by calling 911 or calling your MD immediately  if symptoms less severe.  You Must read complete instructions/literature along with all the possible adverse reactions/side effects for all the Medicines you take and that have been prescribed to you. Take any new Medicines after you have completely understood and accpet all the possible adverse reactions/side effects.   Increase activity slowly   Complete by:  As directed       Discharge Medications   Allergies as of 03/30/2018      Reactions   Codeine Sulfate Other (See Comments)   REACTION: GI upset   Sulfonamide Derivatives Other (See Comments)   REACTION: GI upset   Vancomycin Other (See Comments)   Red man syndrome   Atarax [hydroxyzine] Nausea Only, Rash      Medication List    TAKE these medications   acetaminophen 500 MG tablet Commonly known as:  TYLENOL Take 1,000 mg by mouth every 6 (six) hours as needed for headache.   amLODipine 5 MG tablet Commonly known as:  NORVASC Take 5 mg by mouth at bedtime.   BYSTOLIC 10 MG tablet Generic drug:  nebivolol TAKE 1 TABLET BY MOUTH ONCE DAILY AFTER  BREAKFAST   CENTRUM SILVER 50+WOMEN Tabs Take 1 tablet by mouth daily.   diazepam 5 MG tablet Commonly known as:  VALIUM TAKE 1 TABLET BY MOUTH EVERY 12 HOURS AS NEEDED FOR SEVERE VERTIGO FLARES. AVOID REGULAR USE.   famotidine 10 MG tablet Commonly known as:  PEPCID Take 1 tablet (10 mg total) by mouth 2 (two) times daily.     feeding supplement (ENSURE ENLIVE) Liqd Take 237 mLs by mouth 2 (two) times daily between meals.   furosemide 20 MG tablet Commonly known as:  LASIX Take 1 tablet (20 mg total) by mouth daily. What changed:    when to take this  reasons to take this   gabapentin 100 MG capsule Commonly known as:  NEURONTIN TAKE 1 CAPSULE BY MOUTH TWICE DAILY BEFORE MEAL(S)   glimepiride 2 MG tablet Commonly known as:  AMARYL TAKE ONE TABLET BY MOUTH ONCE DAILY BEFORE BREAKFAST What changed:  See the new instructions.   guaiFENesin 600 MG 12 hr tablet Commonly known as:  MUCINEX Take 1 tablet (600 mg total) by mouth 2 (two) times daily as needed for cough.  levothyroxine 100 MCG tablet Commonly known as:  SYNTHROID, LEVOTHROID TAKE 1 TABLET BY MOUTH ONCE DAILY BEFORE BREAKFAST.   loratadine 10 MG tablet Commonly known as:  CLARITIN Take 10 mg by mouth daily.   ondansetron 4 MG disintegrating tablet Commonly known as:  ZOFRAN-ODT Take 1 tablet (4 mg total) by mouth every 8 (eight) hours as needed for nausea or vomiting.   oxymetazoline 0.05 % nasal spray Commonly known as:  AFRIN Place 1 spray into both nostrils 2 (two) times daily as needed for congestion.   predniSONE 20 MG tablet Commonly known as:  DELTASONE Take 2 tablets (40 mg total) by mouth daily with breakfast.       Follow-up Information    [name], MD. Schedule an appointment as soon as possible for a visit in 3 day(s).   Specialty:  Family Medicine Contact information: [place] [address] [phone]        Jerrell Belfast, MD. Schedule an appointment as soon as possible for a visit in 2 day(s).   Specialty:  Otolaryngology Why:  Nose Bleed, with packing, call today Contact information: [address] [place] [address] [phone]           Major procedures and Radiology Reports - PLEASE review detailed and final reports thoroughly  -          Dg Chest 2 View  Result Date: 03/15/2018 CLINICAL DATA:  Decreased oxygen saturation. History of breast cancer. EXAM: CHEST - 2 VIEW COMPARISON:  Single-view of the chest 08/26/2017. CT chest 09/17/2017. FINDINGS: Dense airspace disease is present in the upper lobes bilaterally most consistent with multifocal pneumonia. There is also some airspace disease in the right middle lobe. Marked cardiomegaly is identified. No pneumothorax or pleural fluid. Aortic atherosclerosis is noted. No acute or focal bony abnormality. IMPRESSION: Dense bilateral upper lobe airspace disease and patchy airspace opacity in the right middle lobe most consistent with pneumonia. Cardiomegaly. Atherosclerosis. Electronically Signed   By: Inge Rise M.D.   On: 03/15/2018 09:52   Dg Abd 1 View  Result Date: 03/15/2018 CLINICAL DATA:  OG tube placement EXAM: ABDOMEN - 1 VIEW COMPARISON:  06/30/2016 FINDINGS: Esophageal tube tip and side port overlie the stomach. Postsurgical changes in the abdomen. Partially visible air filled bowel in the upper abdomen. IMPRESSION: Esophageal tube tip overlies the gastric body Electronically Signed   By: Donavan Foil M.D.   On: 03/15/2018 19:32   Ct Chest High Resolution  Result Date: 03/29/2018 CLINICAL DATA:  Interstitial lung disease, shortness of breath. Wegener's disease, breast cancer. EXAM: CT CHEST WITHOUT CONTRAST TECHNIQUE: Multidetector CT imaging of the chest was performed following the standard protocol without intravenous contrast. High resolution imaging of the lungs, as well as inspiratory and expiratory imaging, was performed. COMPARISON:  03/15/2018, 09/17/2017 and 04/14/2013. FINDINGS: Cardiovascular: Atherosclerotic calcification of the aorta, aortic valve and coronary arteries. Pulmonary arteries and heart are enlarged. Mediastinum/Nodes: Mediastinal lymph nodes are not enlarged by CT size criteria. Hilar regions are difficult to evaluate without IV contrast.  No axillary adenopathy. Esophagus is grossly unremarkable. Lungs/Pleura: Interval improvement in bilateral areas of consolidation with residual ground-glass, bronchiectasis/bronchiolectasis and architectural distortion. No pleural fluid. Airway is unremarkable. There is air trapping. Upper Abdomen: Visualized portions of the liver, adrenal glands, kidneys and spleen are unremarkable. A 1.7 cm low-attenuation lesion is again seen in the tail of the pancreas. Small hiatal hernia. Visualized portions of the stomach and bowel are  otherwise unremarkable. Cholecystectomy. Musculoskeletal: Degenerative changes in the spine. No worrisome lytic or sclerotic lesions. IMPRESSION: 1. Interval improvement in bilateral airspace consolidation with residual ground-glass, bronchiectasis/bronchiolectasis, septal thickening and architectural distortion. Findings may be due granulomatosis with polyangiitis and a positive steroid response. Treated multilobar pneumonia could also have this appearance. 2. Air trapping is indicative of small airways disease. 3. Low-attenuation lesion in tail of the pancreas, stable and likely benign. 4. Aortic atherosclerosis (ICD10-170.0). Coronary artery calcification. 5. Pulmonary arterial hypertension. Electronically Signed   By: Lorin Picket M.D.   On: 03/29/2018 09:43   Ct Chest High Resolution  Result Date: 03/15/2018 CLINICAL DATA:  84 year old female with history of small vessel vasculitis, carcinoid tumor of the lung and breast cancer. Shortness of breath and hemoptysis. EXAM: CT CHEST WITHOUT CONTRAST TECHNIQUE: Multidetector CT imaging of the chest was performed following the standard protocol without intravenous contrast. High resolution imaging of the lungs, as well as inspiratory and expiratory imaging, was performed. COMPARISON:  Chest CT 09/17/2017. FINDINGS: Cardiovascular: Heart size is mildly enlarged with biatrial dilatation. Trace amount of pericardial fluid and/or thickening,  unlikely to be of any hemodynamic significance at this time. No associated pericardial calcification. There is aortic atherosclerosis, as well as atherosclerosis of the great vessels of the mediastinum and the coronary arteries, including calcified atherosclerotic plaque in the left main, left anterior descending, left circumflex and right coronary arteries. Mediastinum/Nodes: No pathologically enlarged mediastinal or hilar lymph nodes. Please note that accurate exclusion of hilar adenopathy is limited on noncontrast CT scans. Small hiatal hernia. No axillary lymphadenopathy. Lungs/Pleura: Today's study is severely limited by considerable patient respiratory motion, as well as widespread multifocal airspace disease throughout all aspects of the lungs bilaterally. This limits the diagnostic sensitivity and specificity of the examination for assessment of underlying interstitial lung disease. In addition to patchy areas of frank airspace consolidation with internal air bronchograms, there is widespread peribronchovascular ground-glass attenuation scattered throughout all aspects of the lungs bilaterally. No definite honeycombing is confidently identified on today's limited examination. No definite bronchiectasis. Inspiratory and expiratory imaging demonstrates partial collapse of the trachea and mainstem bronchi, indicative of tracheobronchomalacia. Upper Abdomen: 2.2 cm low-attenuation lesion in the tail of the pancreas (axial image 135 of series 3), incompletely characterized on today's noncontrast CT examination, but similar to prior chest CT 04/14/2013. Aortic atherosclerosis. Diffuse low attenuation throughout the visualized hepatic parenchyma, indicative of hepatic steatosis. Musculoskeletal: There are no aggressive appearing lytic or blastic lesions noted in the visualized portions of the skeleton. IMPRESSION: 1. The appearance of the lungs is most compatible with severe multilobar bronchopneumonia. In the  setting of hemoptysis, the possibility of diffuse alveolar hemorrhage could also be considered, but is not strongly favored. 2. Cardiomegaly with biatrial dilatation. 3. Tracheobronchomalacia. 4. Aortic atherosclerosis, in addition to left main and 3 vessel coronary artery disease. 5. Hepatic steatosis. 6. 2.2 cm low-attenuation lesion in the tail of the pancreas, similar dating back to 2015, incompletely characterized on today's noncontrast CT examination. Given the stability over the prior 4 years this is considered likely benign, and no specific imaging follow-up is recommended. This recommendation follows ACR consensus guidelines: Management of Incidental Pancreatic Cysts: A White Paper of the ACR Incidental Findings Committee. [place] [id number];30:[id number]. Aortic Atherosclerosis (ICD10-I70.0). Electronically Signed   By: Vinnie Langton M.D.   On: 03/15/2018 17:13   Dg Chest Port 1 View  Result Date: 03/19/2018 CLINICAL DATA:  Respiratory failure EXAM: PORTABLE CHEST 1 VIEW COMPARISON:  March 18, 2018 FINDINGS: Airspace consolidation is noted throughout both upper and mid lung regions and to a lesser extent in the left lower lobe region. There is cardiomegaly with pulmonary vascularity normal. No adenopathy. There is aortic atherosclerosis. No bone lesions. IMPRESSION: Multifocal infiltrate felt to represent extensive pneumonia, stable. No new appreciable opacity compared to 1 day prior. Stable cardiomegaly. There is aortic atherosclerosis. Aortic Atherosclerosis (ICD10-I70.0). Electronically Signed   By: Lowella Grip III M.D.   On: 03/19/2018 07:19   Dg Chest Port 1 View  Result Date: 03/18/2018 CLINICAL DATA:  Respiratory failure EXAM: PORTABLE CHEST 1 VIEW COMPARISON:  03/17/2018 FINDINGS: Cardiac shadow is enlarged but stable. Multifocal waxing and waning infiltrates are identified now seen primarily in the right mid lung as well as the left mid and lower lung. The endotracheal tube  and nasogastric catheter have been removed in the interval. No pneumothorax or effusion is seen. IMPRESSION: Multifocal infiltrative changes waxing and weaning from the prior exam with some improved aeration in the right lung base. Electronically Signed   By: Inez Catalina M.D.   On: 03/18/2018 07:26   Dg Chest Port 1 View  Result Date: 03/17/2018 CLINICAL DATA:  Acute respiratory failure with hypoxia, history hypertension, stroke, breast cancer, chronic atrial fibrillation, pulmonary hypertension, malignant carcinoid tumor, type II diabetes mellitus, former smoker EXAM: PORTABLE CHEST 1 VIEW COMPARISON:  Portable exam 0503 hours compared to 03/16/2018 FINDINGS: Tip of endotracheal tube projects 2.6 cm above carina. Nasogastric tube extends into stomach. Enlargement of cardiac silhouette . Mediastinal contours normal. Atherosclerotic calcification aorta. BILATERAL patchy pulmonary infiltrates again identified, favor pneumonia, slightly improved in RIGHT upper lobe. No definite pleural effusion or pneumothorax. Bones demineralized. IMPRESSION: Persistent BILATERAL pulmonary infiltrates, slightly improved in RIGHT upper lobe since previous exam. Electronically Signed   By: Lavonia Dana M.D.   On: 03/17/2018 09:15   Portable Chest Xray  Result Date: 03/16/2018 CLINICAL DATA:  Acute respiratory failure with hypoxemia. EXAM: PORTABLE CHEST 1 VIEW COMPARISON:  Radiograph of March 15, 2018. FINDINGS: Stable cardiomegaly. Endotracheal and nasogastric tubes are unchanged in position. Atherosclerosis of thoracic aorta is noted. No pneumothorax or significant pleural effusion is noted. Stable large bilateral diffuse airspace opacities are noted most consistent with pneumonia. IMPRESSION: Stable support apparatus. Stable bilateral lung airspace opacities as described above. Aortic Atherosclerosis (ICD10-I70.0). Electronically Signed   By: Marijo Conception, M.D.   On: 03/16/2018 07:49   Portable Chest X-ray  Result  Date: 03/15/2018 CLINICAL DATA:  ETT position EXAM: PORTABLE CHEST 1 VIEW COMPARISON:  03/15/2018, 09/26/2017, CT 03/15/2018 FINDINGS: Endotracheal tube tip is about 17 mm superior to the carina. Esophageal tube tip below the diaphragm but non included. Cardiomegaly with aortic atherosclerosis. Multifocal airspace consolidations. No pneumothorax. IMPRESSION: 1. Endotracheal tube tip about 17 mm superior to the carina 2. Multifocal bilateral airspace consolidations suspect for multifocal pneumonia 3. Cardiomegaly Electronically Signed   By: Donavan Foil M.D.   On: 03/15/2018 19:34    Micro Results     No results found for this or any previous visit (from the past 240 hour(s)).  Today   Subjective    [name] [name] today has no headache,no chest abdominal pain,no new weakness tingling or numbness, feels much better wants to go home today.     Objective   Blood pressure 128/63, pulse (!) 59, temperature 99.3 F (37.4 C), temperature source Oral, resp. rate 17, height _0  (1.676 m), weight 65.2 kg, SpO2 100 %.   Intake/Output Summary (  Last 24 hours) at 03/30/2018 0918 Last data filed at 03/29/2018 2150 Gross per 24 hour  Intake 351.36 ml  Output -  Net 351.36 ml    Exam Awake Alert, Oriented x 3, No new F.N deficits, Normal affect Mildred.AT,PERRAL, R nasal packing in place, no new blood Supple Neck,No JVD, No cervical lymphadenopathy appriciated.  Symmetrical Chest wall movement, Good air movement bilaterally, CTAB RRR,No Gallops,Rubs or new Murmurs, No Parasternal Heave +ve B.Sounds, Abd Soft, Non tender, No organomegaly appriciated, No rebound -guarding or rigidity. No Cyanosis, Clubbing or edema, No new Rash or bruise   Data Review   CBC w Diff:  Lab Results  Component Value Date   WBC 10.7 (H) 03/30/2018   HGB 7.7 (L) 03/30/2018   HGB 9.4 (L) 02/09/2014   HCT 23.6 (L) 03/30/2018   HCT 28.8 (L) 02/09/2014   PLT 175 03/30/2018   PLT 231 02/09/2014   LYMPHOPCT 6 03/21/2018     LYMPHOPCT 18.4 02/09/2014   BANDSPCT 9 04/15/2013   MONOPCT 10 03/21/2018   MONOPCT 12.1 02/09/2014   EOSPCT 0 03/21/2018   EOSPCT 3.1 02/09/2014   BASOPCT 0 03/21/2018   BASOPCT 0.3 02/09/2014    CMP:  Lab Results  Component Value Date   NA 136 03/30/2018   NA 131 (L) 02/08/2013   K 4.2 03/30/2018   K 5.0 02/08/2013   CL 101 03/30/2018   CL 98 08/02/2012   CO2 25 03/30/2018   CO2 21 (L) 02/08/2013   BUN 44 (H) 03/30/2018   BUN 29.7 (H) 02/08/2013   CREATININE 1.83 (H) 03/30/2018   CREATININE 1.61 (H) 10/07/2016   CREATININE 1.8 (H) 02/08/2013   PROT 5.4 (L) 03/30/2018   PROT 6.0 (L) 02/08/2013   ALBUMIN 2.9 (L) 03/30/2018   ALBUMIN 3.3 (L) 02/08/2013   BILITOT 1.7 (H) 03/30/2018   BILITOT 0.68 02/08/2013   ALKPHOS 40 03/30/2018   ALKPHOS 35 (L) 02/08/2013   AST 22 03/30/2018   AST 16 02/08/2013   ALT 18 03/30/2018   ALT 15 02/08/2013  .   Total Time in preparing paper work, data evaluation and todays exam - 88 minutes  Lala Lund M.D on 03/30/2018 at [place]  [phone]

## 2018-03-30 NOTE — Telephone Encounter (Signed)
TCM Spoke with patient and she was "very tired" and would like a return call back at another time.

## 2018-03-30 NOTE — Progress Notes (Signed)
Nikki B Crandell to be D/C'd  To home per MD order. Discussed with the patient and all questions fully answered.  Skin clean, dry and intact without evidence of skin break down, no evidence of skin tears noted.  IV catheter discontinued intact. Site without signs and symptoms of complications. Dressing and pressure applied.  An After Visit Summary was printed and given to the patient.  Patient escorted via [place], and D/C home via private auto.  [place]  03/30/2018 11:16 AM

## 2018-04-01 ENCOUNTER — Inpatient Hospital Stay (HOSPITAL_COMMUNITY): Admission: RE | Admit: 2018-04-01 | Payer: Medicare Other | Source: Ambulatory Visit

## 2018-04-01 DIAGNOSIS — M3130 Wegener's granulomatosis without renal involvement: Secondary | ICD-10-CM | POA: Diagnosis not present

## 2018-04-01 DIAGNOSIS — I482 Chronic atrial fibrillation, unspecified: Secondary | ICD-10-CM | POA: Diagnosis not present

## 2018-04-01 DIAGNOSIS — I130 Hypertensive heart and chronic kidney disease with heart failure and stage 1 through stage 4 chronic kidney disease, or unspecified chronic kidney disease: Secondary | ICD-10-CM | POA: Diagnosis not present

## 2018-04-01 DIAGNOSIS — J9601 Acute respiratory failure with hypoxia: Secondary | ICD-10-CM | POA: Diagnosis not present

## 2018-04-01 DIAGNOSIS — E1122 Type 2 diabetes mellitus with diabetic chronic kidney disease: Secondary | ICD-10-CM | POA: Diagnosis not present

## 2018-04-01 DIAGNOSIS — J181 Lobar pneumonia, unspecified organism: Secondary | ICD-10-CM | POA: Diagnosis not present

## 2018-04-02 ENCOUNTER — Telehealth: Payer: Self-pay | Admitting: Cardiology

## 2018-04-02 DIAGNOSIS — I482 Chronic atrial fibrillation, unspecified: Secondary | ICD-10-CM | POA: Diagnosis not present

## 2018-04-02 DIAGNOSIS — J181 Lobar pneumonia, unspecified organism: Secondary | ICD-10-CM | POA: Diagnosis not present

## 2018-04-02 DIAGNOSIS — J9601 Acute respiratory failure with hypoxia: Secondary | ICD-10-CM | POA: Diagnosis not present

## 2018-04-02 DIAGNOSIS — M3130 Wegener's granulomatosis without renal involvement: Secondary | ICD-10-CM | POA: Diagnosis not present

## 2018-04-02 DIAGNOSIS — I130 Hypertensive heart and chronic kidney disease with heart failure and stage 1 through stage 4 chronic kidney disease, or unspecified chronic kidney disease: Secondary | ICD-10-CM | POA: Diagnosis not present

## 2018-04-02 DIAGNOSIS — E1122 Type 2 diabetes mellitus with diabetic chronic kidney disease: Secondary | ICD-10-CM | POA: Diagnosis not present

## 2018-04-02 NOTE — Telephone Encounter (Signed)
Spoke with Clair Gulling from [facility] who states today while visiting pt her BP and pulse was normal at rest and doing a few small exercise while sitting. He reports when pt got up to walk about 100 ft she felt dizzy, got sob, O2 98%, and pulse irregular. He states once pt sat down it went away within 30 sec and pt pulse back regular. He states pt says this happens sometimes. Trinidad Curet to instruct pt to contact on call provider or go to the ED if symptoms reoccur. Clair Gulling reports he is no longer at the pts house.  Attempted to contact pt. Unable to leave message as voicemail is not set up. Will route to MD for any further recommendation.

## 2018-04-02 NOTE — Telephone Encounter (Signed)
[facility] Care Management Follow-up Telephone Call  Admit date: 03/29/2018  Discharge date: 03/30/2018 Diagnosis: Right nasal bleed/acute epistaxis.   How have you been since you were released from the hospital? "I'm doing much better". Pt states nasal packing remains in place, does not have an appt with Otolaryngology. Home Health nurse visited yesterday, recommended humidifier d/t dry air. Pt on oxygen nightly.    Do you understand why you were in the hospital? yes, excessive nose bleed causing bloody emesis.    Do you understand the discharge instructions? yes   Where were you discharged to? Home.    Items Reviewed:  Medications reviewed: no, pt states no changes. Advised to bring to f/u appt.   Allergies reviewed: yes  Dietary changes reviewed: yes  Referrals reviewed: yes, pt states no other appts currently   Functional Questionnaire:   Activities of Daily Living (ADLs):   She states they are independent in the following: ambulation, bathing and hygiene, feeding, continence, grooming, toileting and dressing States they require assistance with the following: grooming and none.    Any transportation issues/concerns?: no, son drives pt to appts.    Any patient concerns? no   Confirmed importance and date/time of follow-up visits scheduled yes  Provider Appointment booked with PCP 04/06/18 @ 11:15.   Confirmed with patient if condition begins to worsen call PCP or go to the ER.  Patient was given the office number and encouraged to call back with question or concerns.  : yes

## 2018-04-02 NOTE — Telephone Encounter (Signed)
New Message   [name] [name] is calling from [facility] to report pt had recent short hospital stay with nose bleed,  pt has chronic afib at rest not present with moderate exertion, she was symptomatic and the afib significantly increased. Not familiar of normal baseline and after sitting down it cleared. If any questions please call

## 2018-04-03 NOTE — Telephone Encounter (Signed)
She has chronic Afib so irregular pulse is not a concern. Just monitor for now.  [name] Yanes Martinique MD, [facility]

## 2018-04-05 ENCOUNTER — Inpatient Hospital Stay: Payer: Medicare Other | Admitting: Family Medicine

## 2018-04-05 NOTE — Telephone Encounter (Signed)
Returned call to Herbert Deaner PT with [facility] left message on personal voice mail Dr.Jordan's advice.

## 2018-04-06 ENCOUNTER — Telehealth: Payer: Self-pay | Admitting: Cardiology

## 2018-04-06 DIAGNOSIS — J181 Lobar pneumonia, unspecified organism: Secondary | ICD-10-CM | POA: Diagnosis not present

## 2018-04-06 DIAGNOSIS — M3130 Wegener's granulomatosis without renal involvement: Secondary | ICD-10-CM | POA: Diagnosis not present

## 2018-04-06 DIAGNOSIS — E1122 Type 2 diabetes mellitus with diabetic chronic kidney disease: Secondary | ICD-10-CM | POA: Diagnosis not present

## 2018-04-06 DIAGNOSIS — J9601 Acute respiratory failure with hypoxia: Secondary | ICD-10-CM | POA: Diagnosis not present

## 2018-04-06 DIAGNOSIS — I130 Hypertensive heart and chronic kidney disease with heart failure and stage 1 through stage 4 chronic kidney disease, or unspecified chronic kidney disease: Secondary | ICD-10-CM | POA: Diagnosis not present

## 2018-04-06 DIAGNOSIS — I482 Chronic atrial fibrillation, unspecified: Secondary | ICD-10-CM | POA: Diagnosis not present

## 2018-04-06 NOTE — Telephone Encounter (Signed)
New Message    [name] [name] stated she would call the patient in response of there conversation and patient never heard anything.  Patient reports the last couple days with significant increase of fatigue from lunchtime until about bedtime. Fax updated medication list to [phone].  Patient is going to see Dr.Burchette (PCP) on January 3rd to have nasal tube removed from recent ER visit due to severe nose bleed.

## 2018-04-06 NOTE — Telephone Encounter (Signed)
Spoke to [name] [name]  ,informed him that message was only relayed to him and not the patient. Patient is to keep appointment with primary , if cardiac is needed  primary or patient can call back

## 2018-04-07 DIAGNOSIS — I482 Chronic atrial fibrillation, unspecified: Secondary | ICD-10-CM | POA: Diagnosis not present

## 2018-04-07 DIAGNOSIS — J9601 Acute respiratory failure with hypoxia: Secondary | ICD-10-CM | POA: Diagnosis not present

## 2018-04-07 DIAGNOSIS — J181 Lobar pneumonia, unspecified organism: Secondary | ICD-10-CM | POA: Diagnosis not present

## 2018-04-07 DIAGNOSIS — M3130 Wegener's granulomatosis without renal involvement: Secondary | ICD-10-CM | POA: Diagnosis not present

## 2018-04-07 DIAGNOSIS — E1122 Type 2 diabetes mellitus with diabetic chronic kidney disease: Secondary | ICD-10-CM | POA: Diagnosis not present

## 2018-04-07 DIAGNOSIS — I130 Hypertensive heart and chronic kidney disease with heart failure and stage 1 through stage 4 chronic kidney disease, or unspecified chronic kidney disease: Secondary | ICD-10-CM | POA: Diagnosis not present

## 2018-04-08 DIAGNOSIS — I130 Hypertensive heart and chronic kidney disease with heart failure and stage 1 through stage 4 chronic kidney disease, or unspecified chronic kidney disease: Secondary | ICD-10-CM | POA: Diagnosis not present

## 2018-04-08 DIAGNOSIS — I482 Chronic atrial fibrillation, unspecified: Secondary | ICD-10-CM | POA: Diagnosis not present

## 2018-04-08 DIAGNOSIS — J9601 Acute respiratory failure with hypoxia: Secondary | ICD-10-CM | POA: Diagnosis not present

## 2018-04-08 DIAGNOSIS — E1122 Type 2 diabetes mellitus with diabetic chronic kidney disease: Secondary | ICD-10-CM | POA: Diagnosis not present

## 2018-04-08 DIAGNOSIS — M3130 Wegener's granulomatosis without renal involvement: Secondary | ICD-10-CM | POA: Diagnosis not present

## 2018-04-08 DIAGNOSIS — J181 Lobar pneumonia, unspecified organism: Secondary | ICD-10-CM | POA: Diagnosis not present

## 2018-04-09 ENCOUNTER — Ambulatory Visit (INDEPENDENT_AMBULATORY_CARE_PROVIDER_SITE_OTHER): Payer: Medicare Other

## 2018-04-09 ENCOUNTER — Other Ambulatory Visit: Payer: Self-pay | Admitting: Family Medicine

## 2018-04-09 ENCOUNTER — Other Ambulatory Visit: Payer: Self-pay

## 2018-04-09 ENCOUNTER — Encounter: Payer: Self-pay | Admitting: Family Medicine

## 2018-04-09 ENCOUNTER — Ambulatory Visit (INDEPENDENT_AMBULATORY_CARE_PROVIDER_SITE_OTHER): Payer: Medicare Other | Admitting: Family Medicine

## 2018-04-09 VITALS — BP 140/70 | HR 68 | Ht 66.0 in | Wt 139.3 lb

## 2018-04-09 DIAGNOSIS — D649 Anemia, unspecified: Secondary | ICD-10-CM | POA: Diagnosis not present

## 2018-04-09 DIAGNOSIS — E871 Hypo-osmolality and hyponatremia: Secondary | ICD-10-CM

## 2018-04-09 DIAGNOSIS — N183 Chronic kidney disease, stage 3 unspecified: Secondary | ICD-10-CM

## 2018-04-09 DIAGNOSIS — N184 Chronic kidney disease, stage 4 (severe): Secondary | ICD-10-CM

## 2018-04-09 DIAGNOSIS — J189 Pneumonia, unspecified organism: Secondary | ICD-10-CM

## 2018-04-09 DIAGNOSIS — Z7901 Long term (current) use of anticoagulants: Secondary | ICD-10-CM | POA: Diagnosis not present

## 2018-04-09 DIAGNOSIS — Z9981 Dependence on supplemental oxygen: Secondary | ICD-10-CM | POA: Diagnosis not present

## 2018-04-09 DIAGNOSIS — R040 Epistaxis: Secondary | ICD-10-CM | POA: Diagnosis not present

## 2018-04-09 LAB — BASIC METABOLIC PANEL
BUN: 34 mg/dL — ABNORMAL HIGH (ref 6–23)
CO2: 23 mEq/L (ref 19–32)
Calcium: 8.5 mg/dL (ref 8.4–10.5)
Chloride: 98 mEq/L (ref 96–112)
Creatinine, Ser: 1.98 mg/dL — ABNORMAL HIGH (ref 0.40–1.20)
GFR: 25.55 mL/min — ABNORMAL LOW (ref >60.00)
GLUCOSE: 113 mg/dL — AB (ref 70–99)
Potassium: 4.4 mEq/L (ref 3.5–5.1)
Sodium: 131 mEq/L — ABNORMAL LOW (ref 135–145)

## 2018-04-09 LAB — CBC WITH DIFFERENTIAL/PLATELET
Basophils Absolute: 0 10*3/uL (ref 0.0–0.1)
Basophils Relative: 0.4 % (ref 0.0–3.0)
EOS PCT: 0.9 % (ref 0.0–5.0)
Eosinophils Absolute: 0.1 10*3/uL (ref 0.0–0.7)
Lymphocytes Relative: 19.6 % (ref 12.0–46.0)
Lymphs Abs: 1.2 10*3/uL (ref 0.7–4.0)
MCHC: 33.7 g/dL (ref 30.0–36.0)
MCV: 94.5 fl (ref 78.0–100.0)
Monocytes Absolute: 0.6 10*3/uL (ref 0.1–1.0)
Monocytes Relative: 9.9 % (ref 3.0–12.0)
Neutro Abs: 4.4 10*3/uL (ref 1.4–7.7)
Neutrophils Relative %: 69.2 % (ref 43.0–77.0)
Platelets: 253 10*3/uL (ref 150.0–400.0)
RBC: 2.54 Mil/uL — ABNORMAL LOW (ref 3.87–5.11)
RDW: 16.2 % — ABNORMAL HIGH (ref 11.5–15.5)
WBC: 6.3 10*3/uL (ref 4.0–10.5)

## 2018-04-09 NOTE — Progress Notes (Addendum)
Subjective:     Patient ID: [name] [name], female   DOB: 1933-03-14, 85 y.o.   MRN: [id number]  HPI Patient is seen following recent hospitalization for severe right nostril epistaxis and worsening anemia.  Her chronic problems include history of hypertension, diastolic heart failure, chronic atrial fibrillation, Wegener's granulomatosis, pulmonary hypertension, history of carcinoid tumor of the colon, hypothyroidism, type 2 diabetes, chronic kidney disease, history of breast cancer, chronic normocytic anemia  She had been recently admitted for some hemoptysis and probable multifocal pneumonia.  She has been taken off Eliquis.  She presented to the ER on 03/29/2018 with severe right nostril epistaxis.  That started around 4 AM the day of admission.  She had nasal packing in the ER and nasal balloon placement which stabilized her bleeding.  ENT was consulted and they recommended 23-hour observation with hospitalist.  Patient has chronic anemia but hemoglobin dropped to 6.8.  She received 1 unit of packed red blood cells.  She has had no further bleeding since that time.  She feels fatigued but stable from a respiratory standpoint.  Denies any recent cough.  Recommendation was for follow-up CBC, basic metabolic panel, and chest x-ray.  Question of recent multifocal pneumonia by recent CT and chest x-ray.  She also has Wegener's granulomatosis.  Past Medical History:  Diagnosis Date  . Arthritis    r tkr  . Breast cancer ([place])   . Chronic atrial fibrillation    CHADS2-VASc=6.  on low dose Eliquis (corrected for age & renal fxn)  . CVA (cerebral vascular accident) ([place]) 2008   mini stroke.no residual  . DEPRESSION 05/16/2009  . Diabetes mellitus type II   . Heart murmur    stress test 2009.dr peter [place]  . HYPERLIPIDEMIA 07/24/2008  . HYPERTENSION 07/24/2008  . HYPOTHYROIDISM 07/24/2008  . Intermittent vertigo   . Metastatic carcinoid tumor to intra-abdominal site ([place])   . Pneumonia   .  Pulmonary hypertension ([place])    Mod PHTN on Echo 03/2016: 2/2 combination of Wegener's, HFPF & Afib.  . Skin abnormality    facial lesions .pt applying mupiracin to areas  . Vertigo   . Wegener's disease, pulmonary ([place]) 10/2012   Past Surgical History:  Procedure Laterality Date  . BREAST SURGERY  2007   Lumpectomy, XRT 2006.l breast  . CHOLECYSTECTOMY  1980  . EXPLORATORY LAPAROTOMY    . HEEL SPUR SURGERY Right   . JOINT REPLACEMENT     r knee  . KNEE SURGERY  2009   TKR  . TEE WITHOUT CARDIOVERSION N/A 09/16/2017   Procedure: TRANSESOPHAGEAL ECHOCARDIOGRAM (TEE);  Surgeon: Larey Dresser, MD;  Location: [facility] ENDOSCOPY;  Service: Cardiovascular;  Laterality: N/A;  . TRANSTHORACIC ECHOCARDIOGRAM  03/2016   Afib (no Diastolic Fxn). EF 55-60%. No RWMA. Mild Aortic dilation Mild MR. Severe LA dilation. Mod RA dilation.  Mod TR with Mod-Severe Pulmonary HTN - but normal RV function..  . VENTRAL HERNIA REPAIR N/A 07/01/2016   Procedure: REPAIR OF INCARCERATED INCISIONAL HERNIA ;  Surgeon: Stark Klein, MD;  Location: [facility] ORS;  Service: General;  Laterality: N/A;  . VIDEO ASSISTED THORACOSCOPY  04/22/2011   Procedure: VIDEO ASSISTED THORACOSCOPY;  Surgeon: Melrose Nakayama, MD;  Location: [place];  Service: Thoracic;  Laterality: Left;  WITH BIOPSY    reports that she quit smoking about 35 years ago. Her smoking use included cigarettes. She has a 6.00 pack-year smoking history. She has never used smokeless tobacco. She reports that she does not  drink alcohol or use drugs. family history includes Arthritis in her mother; Breast cancer in her sister; Cancer in her sister; Coronary artery disease in her brother and father; Heart disease in her father; Lung cancer in her brother; Rheum arthritis in her mother. Allergies  Allergen Reactions  . Codeine Sulfate Other (See Comments)    REACTION: GI upset  . Sulfonamide Derivatives Other (See Comments)    REACTION: GI upset  . Vancomycin Other (See  Comments)    Red man syndrome  . Atarax [Hydroxyzine] Nausea Only and Rash     Review of Systems  Constitutional: Positive for fatigue. Negative for chills and fever.  HENT: Positive for congestion.   Respiratory: Negative for cough and shortness of breath.   Cardiovascular: Negative for chest pain and leg swelling.  Gastrointestinal: Negative for abdominal pain.  Neurological: Negative for headaches.  Psychiatric/Behavioral: Negative for confusion.       Objective:   Physical Exam Constitutional:      Appearance: Normal appearance.  HENT:     Nose:     Comments: She has right nasal packing in place with catheter extending from her right naris and taped to the right side of her face.  There is a lot of clotted blood around the catheter Cardiovascular:     Rate and Rhythm: Normal rate.  Pulmonary:     Effort: Pulmonary effort is normal.     Breath sounds: No wheezing.  Musculoskeletal:     Right lower leg: No edema.     Left lower leg: No edema.  Neurological:     Mental Status: She is alert.        Assessment:     #1 recent right-sided severe epistaxis.  She had this packed almost 2 weeks ago with no bleeding since then.  #2 acute worsening of chronic anemia related to her severe nosebleed  #3 question of multifocal pneumonia by recent chest x-ray.  Stable from respiratory standpoint this point.  No recent recurrent fevers  #4 type 2 diabetes well controlled with recent A1c 6.3% back in September  #5 chronic kidney disease  #6 Wegener's granulomatosis- followed by pulmonary.    Plan:     -Repeat CBC, basic metabolic panel, and chest x-ray -We set up for her to see ENT later today to have nasal balloons and packing removed.  Continue to hold Eliquis at this time -will plan to reassess in about 3-4 weeks.  If bleeding stable at that time will need to decide about re-initiating the Eliquis.  [name] MD [facility] at [facility]

## 2018-04-12 DIAGNOSIS — J9601 Acute respiratory failure with hypoxia: Secondary | ICD-10-CM | POA: Diagnosis not present

## 2018-04-12 DIAGNOSIS — I482 Chronic atrial fibrillation, unspecified: Secondary | ICD-10-CM | POA: Diagnosis not present

## 2018-04-12 DIAGNOSIS — I130 Hypertensive heart and chronic kidney disease with heart failure and stage 1 through stage 4 chronic kidney disease, or unspecified chronic kidney disease: Secondary | ICD-10-CM | POA: Diagnosis not present

## 2018-04-12 DIAGNOSIS — M3130 Wegener's granulomatosis without renal involvement: Secondary | ICD-10-CM | POA: Diagnosis not present

## 2018-04-12 DIAGNOSIS — J181 Lobar pneumonia, unspecified organism: Secondary | ICD-10-CM | POA: Diagnosis not present

## 2018-04-12 DIAGNOSIS — E1122 Type 2 diabetes mellitus with diabetic chronic kidney disease: Secondary | ICD-10-CM | POA: Diagnosis not present

## 2018-04-14 LAB — FUNGUS CULTURE RESULT

## 2018-04-14 LAB — FUNGUS CULTURE WITH STAIN

## 2018-04-14 LAB — FUNGAL ORGANISM REFLEX

## 2018-04-21 DIAGNOSIS — J181 Lobar pneumonia, unspecified organism: Secondary | ICD-10-CM | POA: Diagnosis not present

## 2018-04-21 DIAGNOSIS — M3130 Wegener's granulomatosis without renal involvement: Secondary | ICD-10-CM | POA: Diagnosis not present

## 2018-04-21 DIAGNOSIS — I130 Hypertensive heart and chronic kidney disease with heart failure and stage 1 through stage 4 chronic kidney disease, or unspecified chronic kidney disease: Secondary | ICD-10-CM | POA: Diagnosis not present

## 2018-04-21 DIAGNOSIS — I482 Chronic atrial fibrillation, unspecified: Secondary | ICD-10-CM | POA: Diagnosis not present

## 2018-04-21 DIAGNOSIS — E1122 Type 2 diabetes mellitus with diabetic chronic kidney disease: Secondary | ICD-10-CM | POA: Diagnosis not present

## 2018-04-21 DIAGNOSIS — J9601 Acute respiratory failure with hypoxia: Secondary | ICD-10-CM | POA: Diagnosis not present

## 2018-04-22 ENCOUNTER — Telehealth: Payer: Self-pay | Admitting: Family Medicine

## 2018-04-22 ENCOUNTER — Telehealth: Payer: Self-pay | Admitting: *Deleted

## 2018-04-22 NOTE — Telephone Encounter (Signed)
Copied from [place] [id number]. Topic: Quick Communication - Home Health Verbal Orders >> Apr 22, 2018  4:23 PM Windy Kalata wrote: Caller/Agency: Angie from [facility] Number: [phone] Requesting OT/PT/Skilled Nursing/Social Work: Skilled nursing Frequency: One time a week through  05/20/18

## 2018-04-22 NOTE — Telephone Encounter (Signed)
Copied from [place] [id number]. Topic: Quick Communication - Home Health Verbal Orders >> Apr 22, 2018  1:24 PM Scherrie Gerlach wrote: Caller/Agency: [facility]//Angie Callback Number: [phone] Requesting nursing (additional nursing visits)  1 wk  2  (that will carry to the end of her cert period)

## 2018-04-22 NOTE — Telephone Encounter (Signed)
Pts son Aryan Bello) dropped off [employer] form to be completed by provider and faxed to [phone] Attn: [id number]. Form placed in providers folder for completion.

## 2018-04-23 NOTE — Telephone Encounter (Signed)
Called Angie from [place] and gave her the OK per Dr. Elease Hashimoto. Angie verbalized an agreement.

## 2018-04-23 NOTE — Telephone Encounter (Signed)
ok 

## 2018-04-23 NOTE — Telephone Encounter (Signed)
Please advise.

## 2018-04-23 NOTE — Telephone Encounter (Signed)
Please advise. Not sure if these are the same or if 2 separate messages.

## 2018-04-23 NOTE — Telephone Encounter (Signed)
OK to set up

## 2018-04-23 NOTE — Telephone Encounter (Signed)
Paperwork placed in the red folder on your desk. Please return when complete. Thanks!

## 2018-04-29 LAB — ACID FAST CULTURE WITH REFLEXED SENSITIVITIES (MYCOBACTERIA): Acid Fast Culture: NEGATIVE

## 2018-04-30 DIAGNOSIS — J9601 Acute respiratory failure with hypoxia: Secondary | ICD-10-CM | POA: Diagnosis not present

## 2018-04-30 DIAGNOSIS — I482 Chronic atrial fibrillation, unspecified: Secondary | ICD-10-CM | POA: Diagnosis not present

## 2018-04-30 DIAGNOSIS — J181 Lobar pneumonia, unspecified organism: Secondary | ICD-10-CM | POA: Diagnosis not present

## 2018-04-30 DIAGNOSIS — M3130 Wegener's granulomatosis without renal involvement: Secondary | ICD-10-CM | POA: Diagnosis not present

## 2018-04-30 DIAGNOSIS — I130 Hypertensive heart and chronic kidney disease with heart failure and stage 1 through stage 4 chronic kidney disease, or unspecified chronic kidney disease: Secondary | ICD-10-CM | POA: Diagnosis not present

## 2018-04-30 DIAGNOSIS — E1122 Type 2 diabetes mellitus with diabetic chronic kidney disease: Secondary | ICD-10-CM | POA: Diagnosis not present

## 2018-05-04 ENCOUNTER — Ambulatory Visit: Payer: Medicare Other | Admitting: Family Medicine

## 2018-05-04 ENCOUNTER — Telehealth: Payer: Self-pay | Admitting: Internal Medicine

## 2018-05-04 NOTE — Telephone Encounter (Signed)
Called and spoke with Patient. No upcoming appointments with Dr. Annamaria Boots, or any [facility] Providers. I did see and let her know that she has appointment 05/04/18, 2:40, with Dr. Elease Hashimoto.  Nothing further at this time.

## 2018-05-05 DIAGNOSIS — I130 Hypertensive heart and chronic kidney disease with heart failure and stage 1 through stage 4 chronic kidney disease, or unspecified chronic kidney disease: Secondary | ICD-10-CM | POA: Diagnosis not present

## 2018-05-05 DIAGNOSIS — I482 Chronic atrial fibrillation, unspecified: Secondary | ICD-10-CM | POA: Diagnosis not present

## 2018-05-05 DIAGNOSIS — M3130 Wegener's granulomatosis without renal involvement: Secondary | ICD-10-CM | POA: Diagnosis not present

## 2018-05-05 DIAGNOSIS — J9601 Acute respiratory failure with hypoxia: Secondary | ICD-10-CM | POA: Diagnosis not present

## 2018-05-05 DIAGNOSIS — E1122 Type 2 diabetes mellitus with diabetic chronic kidney disease: Secondary | ICD-10-CM | POA: Diagnosis not present

## 2018-05-05 DIAGNOSIS — J181 Lobar pneumonia, unspecified organism: Secondary | ICD-10-CM | POA: Diagnosis not present

## 2018-05-11 DIAGNOSIS — E1122 Type 2 diabetes mellitus with diabetic chronic kidney disease: Secondary | ICD-10-CM | POA: Diagnosis not present

## 2018-05-11 DIAGNOSIS — I482 Chronic atrial fibrillation, unspecified: Secondary | ICD-10-CM | POA: Diagnosis not present

## 2018-05-11 DIAGNOSIS — M3130 Wegener's granulomatosis without renal involvement: Secondary | ICD-10-CM | POA: Diagnosis not present

## 2018-05-11 DIAGNOSIS — J9601 Acute respiratory failure with hypoxia: Secondary | ICD-10-CM | POA: Diagnosis not present

## 2018-05-11 DIAGNOSIS — J181 Lobar pneumonia, unspecified organism: Secondary | ICD-10-CM | POA: Diagnosis not present

## 2018-05-11 DIAGNOSIS — I130 Hypertensive heart and chronic kidney disease with heart failure and stage 1 through stage 4 chronic kidney disease, or unspecified chronic kidney disease: Secondary | ICD-10-CM | POA: Diagnosis not present

## 2018-05-12 ENCOUNTER — Ambulatory Visit: Payer: Medicare Other | Admitting: Family Medicine

## 2018-05-19 ENCOUNTER — Telehealth: Payer: Self-pay

## 2018-05-19 ENCOUNTER — Ambulatory Visit (INDEPENDENT_AMBULATORY_CARE_PROVIDER_SITE_OTHER): Payer: Medicare Other | Admitting: Family Medicine

## 2018-05-19 ENCOUNTER — Encounter: Payer: Self-pay | Admitting: Family Medicine

## 2018-05-19 ENCOUNTER — Other Ambulatory Visit: Payer: Self-pay

## 2018-05-19 VITALS — BP 132/78 | HR 66 | Temp 97.9°F | Ht 69.0 in | Wt 146.5 lb

## 2018-05-19 DIAGNOSIS — E1149 Type 2 diabetes mellitus with other diabetic neurological complication: Secondary | ICD-10-CM

## 2018-05-19 DIAGNOSIS — N183 Chronic kidney disease, stage 3 unspecified: Secondary | ICD-10-CM

## 2018-05-19 DIAGNOSIS — I482 Chronic atrial fibrillation, unspecified: Secondary | ICD-10-CM

## 2018-05-19 NOTE — Patient Instructions (Signed)
Start back on the Eliquis 2.5 mg twice daily.

## 2018-05-19 NOTE — Telephone Encounter (Signed)
Author phoned pt. To offer to schedule AWV. Pt. Stated she could not come in today "too many appointments", but agreed to do next Thursday at [facility]. Appointment made.

## 2018-05-19 NOTE — Progress Notes (Signed)
Subjective:     Patient ID: [name] [name], female   DOB: 11-Nov-1932, 85 y.o.   MRN: [id number]  HPI  Patient had recent office visit for hospital follow-up.  Refer to dictation from 04/09/2018 for details  Patient is seen following recent hospitalization for severe right nostril epistaxis and worsening anemia.  Her chronic problems include history of hypertension, diastolic heart failure, chronic atrial fibrillation, Wegener's granulomatosis, pulmonary hypertension, history of carcinoid tumor of the colon, hypothyroidism, type 2 diabetes, chronic kidney disease, history of breast cancer, chronic normocytic anemia  She had been recently admitted for some hemoptysis and probable multifocal pneumonia.  She has been taken off Eliquis.  She presented to the ER on 03/29/2018 with severe right nostril epistaxis.  That started around 4 AM the day of admission.  She had nasal packing in the ER and nasal balloon placement which stabilized her bleeding.  ENT was consulted and they recommended 23-hour observation with hospitalist.  Patient has chronic anemia but hemoglobin dropped to 6.8.  She received 1 unit of packed red blood cells.  She has had no further bleeding since that time.  She feels fatigued but stable from a respiratory standpoint.  Denies any recent cough.  Recommendation was for follow-up CBC, basic metabolic panel, and chest x-ray.  Question of recent multifocal pneumonia by recent CT and chest x-ray.  She also has Wegener's granulomatosis.  She saw ENT and had packing removed and has had no bleeding whatsoever since then.  We held her Eliquis and she has been off until now.  She has chronic atrial fibrillation history.  Her hemoglobin was 8.1.  She feels better overall.  Appetite is improved.  She is gained about 7 pounds since last visit.  No peripheral edema.  She wears oxygen at night.  No increased dyspnea at rest.  Type 2 diabetes.  A1c 6.3 back in September.  Not monitoring blood sugars  regularly.  No recent hypoglycemia.  Past Medical History:  Diagnosis Date  . Arthritis    r tkr  . Breast cancer ([place])   . Chronic atrial fibrillation    CHADS2-VASc=6.  on low dose Eliquis (corrected for age & renal fxn)  . CVA (cerebral vascular accident) ([place]) 2008   mini stroke.no residual  . DEPRESSION 05/16/2009  . Diabetes mellitus type II   . Heart murmur    stress test 2009.dr peter Martinique  . HYPERLIPIDEMIA 07/24/2008  . HYPERTENSION 07/24/2008  . HYPOTHYROIDISM 07/24/2008  . Intermittent vertigo   . Metastatic carcinoid tumor to intra-abdominal site ([place])   . Pneumonia   . Pulmonary hypertension ([place])    Mod PHTN on Echo 03/2016: 2/2 combination of Wegener's, HFPF & Afib.  . Skin abnormality    facial lesions .pt applying mupiracin to areas  . Vertigo   . Wegener's disease, pulmonary ([place]) 10/2012   Past Surgical History:  Procedure Laterality Date  . BREAST SURGERY  2007   Lumpectomy, XRT 2006.l breast  . CHOLECYSTECTOMY  1980  . EXPLORATORY LAPAROTOMY    . HEEL SPUR SURGERY Right   . JOINT REPLACEMENT     r knee  . KNEE SURGERY  2009   TKR  . TEE WITHOUT CARDIOVERSION N/A 09/16/2017   Procedure: TRANSESOPHAGEAL ECHOCARDIOGRAM (TEE);  Surgeon: Larey Dresser, MD;  Location: [facility] ENDOSCOPY;  Service: Cardiovascular;  Laterality: N/A;  . TRANSTHORACIC ECHOCARDIOGRAM  03/2016   Afib (no Diastolic Fxn). EF 55-60%. No RWMA. Mild Aortic dilation Mild MR. Severe LA dilation. Mod  RA dilation.  Mod TR with Mod-Severe Pulmonary HTN - but normal RV function..  . VENTRAL HERNIA REPAIR N/A 07/01/2016   Procedure: REPAIR OF INCARCERATED INCISIONAL HERNIA ;  Surgeon: Stark Klein, MD;  Location: [facility] ORS;  Service: General;  Laterality: N/A;  . VIDEO ASSISTED THORACOSCOPY  04/22/2011   Procedure: VIDEO ASSISTED THORACOSCOPY;  Surgeon: Melrose Nakayama, MD;  Location: [place] [facility];  Service: Thoracic;  Laterality: Left;  WITH BIOPSY    reports that she quit smoking about 35 years ago.  Her smoking use included cigarettes. She has a 6.00 pack-year smoking history. She has never used smokeless tobacco. She reports that she does not drink alcohol or use drugs. family history includes Arthritis in her mother; Breast cancer in her sister; Cancer in her sister; Coronary artery disease in her brother and father; Heart disease in her father; Lung cancer in her brother; Rheum arthritis in her mother. Allergies  Allergen Reactions  . Codeine Sulfate Other (See Comments)    REACTION: GI upset  . Other Nausea And Vomiting    REACTION: GI upset  . Sulfonamide Derivatives Other (See Comments)    REACTION: GI upset  . Vancomycin Other (See Comments)    Red man syndrome  . Atarax [Hydroxyzine] Nausea Only and Rash      Review of Systems  Constitutional: Negative for chills and fever.  HENT: Negative for nosebleeds.   Respiratory: Negative for cough.   Cardiovascular: Negative for chest pain and leg swelling.  Gastrointestinal: Negative for abdominal pain.  Genitourinary: Negative for dysuria.       Objective:   Physical Exam Constitutional:      Appearance: Normal appearance.  HENT:     Nose: Nose normal.     Mouth/Throat:     Pharynx: Oropharynx is clear.  Neck:     Musculoskeletal: Neck supple.  Cardiovascular:     Comments: Irregular rhythm but rate controlled Pulmonary:     Effort: Pulmonary effort is normal.     Breath sounds: Normal breath sounds.  Musculoskeletal:     Right lower leg: No edema.     Left lower leg: No edema.  Neurological:     Mental Status: She is alert.        Assessment:     #1 recent episode of severe epistaxis resolved after packing  #2 anemia related #1 and chronic kidney disease  #3 history of chronic atrial fibrillation currently off Eliquis secondary to recent severe epistaxis  #4 type 2 diabetes which has been well controlled  #5 chronic kidney disease  #6 history of Wegener's granulomatosis    Plan:     -Check  labs with CBC, basic metabolic panel, J2I -We have advised that she go ahead and start back Eliquis low-dose 2.5 mg twice daily.  Her CHADSVASc score is 6 -We will recommend routine follow-up in 3 months and sooner as needed  [name] MD [facility] at [facility]

## 2018-05-20 DIAGNOSIS — E1122 Type 2 diabetes mellitus with diabetic chronic kidney disease: Secondary | ICD-10-CM | POA: Diagnosis not present

## 2018-05-20 DIAGNOSIS — M3130 Wegener's granulomatosis without renal involvement: Secondary | ICD-10-CM | POA: Diagnosis not present

## 2018-05-20 DIAGNOSIS — I482 Chronic atrial fibrillation, unspecified: Secondary | ICD-10-CM | POA: Diagnosis not present

## 2018-05-20 DIAGNOSIS — I130 Hypertensive heart and chronic kidney disease with heart failure and stage 1 through stage 4 chronic kidney disease, or unspecified chronic kidney disease: Secondary | ICD-10-CM | POA: Diagnosis not present

## 2018-05-20 DIAGNOSIS — J181 Lobar pneumonia, unspecified organism: Secondary | ICD-10-CM | POA: Diagnosis not present

## 2018-05-20 DIAGNOSIS — J9601 Acute respiratory failure with hypoxia: Secondary | ICD-10-CM | POA: Diagnosis not present

## 2018-05-20 LAB — CBC WITH DIFFERENTIAL/PLATELET
Basophils Absolute: 0.1 10*3/uL (ref 0.0–0.1)
Basophils Relative: 0.7 % (ref 0.0–3.0)
Eosinophils Absolute: 0.4 10*3/uL (ref 0.0–0.7)
Eosinophils Relative: 4.5 % (ref 0.0–5.0)
HCT: 25.6 % — ABNORMAL LOW (ref 36.0–46.0)
Hemoglobin: 8.4 g/dL — ABNORMAL LOW (ref 12.0–15.0)
LYMPHS PCT: 35.6 % (ref 12.0–46.0)
Lymphs Abs: 3.1 10*3/uL (ref 0.7–4.0)
MCHC: 32.9 g/dL (ref 30.0–36.0)
MCV: 94.2 fl (ref 78.0–100.0)
Monocytes Absolute: 1 10*3/uL (ref 0.1–1.0)
Monocytes Relative: 11 % (ref 3.0–12.0)
NEUTROS ABS: 4.2 10*3/uL (ref 1.4–7.7)
Neutrophils Relative %: 48.2 % (ref 43.0–77.0)
Platelets: 278 10*3/uL (ref 150.0–400.0)
RBC: 2.72 Mil/uL — ABNORMAL LOW (ref 3.87–5.11)
RDW: 15.2 % (ref 11.5–15.5)
WBC: 8.7 10*3/uL (ref 4.0–10.5)

## 2018-05-20 LAB — BASIC METABOLIC PANEL
BUN: 21 mg/dL (ref 6–23)
CHLORIDE: 100 meq/L (ref 96–112)
CO2: 27 meq/L (ref 19–32)
Calcium: 8.5 mg/dL (ref 8.4–10.5)
Creatinine, Ser: 1.68 mg/dL — ABNORMAL HIGH (ref 0.40–1.20)
GFR: 29.05 mL/min — ABNORMAL LOW (ref >60.00)
Glucose, Bld: 111 mg/dL — ABNORMAL HIGH (ref 70–99)
Potassium: 4.8 mEq/L (ref 3.5–5.1)
Sodium: 134 mEq/L — ABNORMAL LOW (ref 135–145)

## 2018-05-20 LAB — HEMOGLOBIN A1C: Hgb A1c MFr Bld: 6.2 % (ref 4.6–6.5)

## 2018-05-27 ENCOUNTER — Ambulatory Visit: Payer: Medicare Other

## 2018-06-01 ENCOUNTER — Other Ambulatory Visit: Payer: Self-pay | Admitting: Family Medicine

## 2018-06-14 ENCOUNTER — Other Ambulatory Visit: Payer: Self-pay

## 2018-06-14 ENCOUNTER — Encounter: Payer: Self-pay | Admitting: Family Medicine

## 2018-06-14 ENCOUNTER — Ambulatory Visit (INDEPENDENT_AMBULATORY_CARE_PROVIDER_SITE_OTHER): Payer: Medicare Other | Admitting: Family Medicine

## 2018-06-14 ENCOUNTER — Ambulatory Visit: Payer: Self-pay | Admitting: *Deleted

## 2018-06-14 VITALS — BP 142/84 | HR 79 | Temp 97.5°F | Ht 66.0 in | Wt 142.1 lb

## 2018-06-14 DIAGNOSIS — S91209A Unspecified open wound of unspecified toe(s) with damage to nail, initial encounter: Secondary | ICD-10-CM | POA: Diagnosis not present

## 2018-06-14 DIAGNOSIS — I482 Chronic atrial fibrillation, unspecified: Secondary | ICD-10-CM

## 2018-06-14 DIAGNOSIS — S80812A Abrasion, left lower leg, initial encounter: Secondary | ICD-10-CM | POA: Diagnosis not present

## 2018-06-14 DIAGNOSIS — R202 Paresthesia of skin: Secondary | ICD-10-CM

## 2018-06-14 NOTE — Telephone Encounter (Signed)
Pt reports woke up this AM and "Entire left arm is numb." States "I may have slept on it funny."  Reports has full ROM, no pain. Also C/O nausea, no vomiting, onset yesterday, mild.  Denies any other symptoms; no CP, SOB, no facial droop, speech clear during call. Denies headache, no visual changes. States BP has been ranging 120's/70's. Appt made with Dr. Elease Hashimoto for 1345 today. Instructed pt to go to ED if any of above symptoms occur; verbalizes understanding.  Reason for Disposition . [1] Tingling (e.g., pins and needles) of the face, arm / hand, or leg / foot on one side of the body AND [2] present now  Answer Assessment - Initial Assessment Questions 1. SYMPTOM: "What is the main symptom you are concerned about?" (e.g., weakness, numbness)     Numbness entire left  arm 2. ONSET: "When did this start?" (minutes, hours, days; while sleeping)     This AM 3. LAST NORMAL: "When was the last time you were normal (no symptoms)?"     This AM 4. PATTERN "Does this come and go, or has it been constant since it started?"  "Is it present now?"     Constant 5. CARDIAC SYMPTOMS: "Have you had any of the following symptoms: chest pain, difficulty breathing, palpitations?"     No 6. NEUROLOGIC SYMPTOMS: "Have you had any of the following symptoms: headache, dizziness, vision loss, double vision, changes in speech, unsteady on your feet?"     O2 at HS, Mild SOB last night, WNL this AM 7. OTHER SYMPTOMS: "Do you have any other symptoms?"     Nausea,mild, onset yesterday; no vomiting  Protocols used: NEUROLOGIC DEFICIT-A-AH

## 2018-06-14 NOTE — Telephone Encounter (Signed)
FYI 

## 2018-06-14 NOTE — Telephone Encounter (Signed)
Pt coming in today at 1:45 with Dr Elease Hashimoto.  Will send to Nurse as Juluis Rainier

## 2018-06-14 NOTE — Progress Notes (Signed)
Subjective:     Patient ID: [name] [name], female   DOB: 1932/08/28, 85 y.o.   MRN: [id number]  HPI Patient is seen for the following issues  Woke up this morning with some intermittent tingling left upper extremity.  No weakness.  No lower extremity symptoms.  No speech changes.  No confusion.  She thinks she may have slept on her arm in a funny position.  No neck pain.  She states the tingling sensation is poorly localized.  No associated pain.  Patient also relates injury sometime last week.  She was in her recliner with a dog on her lap and wrapped in a blanket.  She went to get out of the recliner and apparently the blanket got tangled on her feet and she fell out.  She had avulsion type injury to the left leg anteriorly and also tore off her left second toenail.  They have been cleaning this with soap and water and using topical antibiotic  She has history of atrial fibrillation is on Eliquis.  In going over her medications she has apparently only been taking her Eliquis once daily.  She has multiple other chronic problems including history of Wegeners granulomatosis, pulmonary hypertension, essential hypertension, diastolic heart failure, A. fib, type 2 diabetes, hypothyroidism, chronic kidney disease  Past Medical History:  Diagnosis Date  . Arthritis    r tkr  . Breast cancer ([place])   . Chronic atrial fibrillation    CHADS2-VASc=6.  on low dose Eliquis (corrected for age & renal fxn)  . CVA (cerebral vascular accident) ([place]) 2008   mini stroke.no residual  . DEPRESSION 05/16/2009  . Diabetes mellitus type II   . Heart murmur    stress test 2009.dr peter [place]ique  . HYPERLIPIDEMIA 07/24/2008  . HYPERTENSION 07/24/2008  . HYPOTHYROIDISM 07/24/2008  . Intermittent vertigo   . Metastatic carcinoid tumor to intra-abdominal site ([place])   . Pneumonia   . Pulmonary hypertension ([place])    Mod PHTN on Echo 03/2016: 2/2 combination of Wegener's, HFPF & Afib.  . Skin abnormality    facial  lesions .pt applying mupiracin to areas  . Vertigo   . Wegener's disease, pulmonary ([place]) 10/2012   Past Surgical History:  Procedure Laterality Date  . BREAST SURGERY  2007   Lumpectomy, XRT 2006.l breast  . CHOLECYSTECTOMY  1980  . EXPLORATORY LAPAROTOMY    . HEEL SPUR SURGERY Right   . JOINT REPLACEMENT     r knee  . KNEE SURGERY  2009   TKR  . TEE WITHOUT CARDIOVERSION N/A 09/16/2017   Procedure: TRANSESOPHAGEAL ECHOCARDIOGRAM (TEE);  Surgeon: Larey Dresser, MD;  Location: [facility] ENDOSCOPY;  Service: Cardiovascular;  Laterality: N/A;  . TRANSTHORACIC ECHOCARDIOGRAM  03/2016   Afib (no Diastolic Fxn). EF 55-60%. No RWMA. Mild Aortic dilation Mild MR. Severe LA dilation. Mod RA dilation.  Mod TR with Mod-Severe Pulmonary HTN - but normal RV function..  . VENTRAL HERNIA REPAIR N/A 07/01/2016   Procedure: REPAIR OF INCARCERATED INCISIONAL HERNIA ;  Surgeon: Stark Klein, MD;  [facility];  Service: General;  Laterality: N/A;  . VIDEO ASSISTED THORACOSCOPY  04/22/2011   Procedure: VIDEO ASSISTED THORACOSCOPY;  Surgeon: Melrose Nakayama, MD;  Location: [facility];  Service: Thoracic;  Laterality: Left;  WITH BIOPSY    reports that she quit smoking about 36 years ago. Her smoking use included cigarettes. She has a 6.00 pack-year smoking history. She has never used smokeless tobacco. She reports that she does  not drink alcohol or use drugs. family history includes Arthritis in her mother; Breast cancer in her sister; Cancer in her sister; Coronary artery disease in her brother and father; Heart disease in her father; Lung cancer in her brother; Rheum arthritis in her mother. Allergies  Allergen Reactions  . Codeine Sulfate Other (See Comments)    REACTION: GI upset  . Other Nausea And Vomiting    REACTION: GI upset  . Sulfonamide Derivatives Other (See Comments)    REACTION: GI upset  . Vancomycin Other (See Comments)    Red man syndrome  . Atarax [Hydroxyzine] Nausea Only and Rash      Review of Systems  Constitutional: Negative for chills and fever.  HENT: Negative for trouble swallowing.   Eyes: Negative for visual disturbance.  Respiratory: Negative for cough.   Cardiovascular: Negative for chest pain.  Neurological: Positive for numbness. Negative for weakness and headaches.  Psychiatric/Behavioral: Negative for confusion.       Objective:   Physical Exam Constitutional:      Appearance: Normal appearance.  Neck:     Musculoskeletal: Neck supple.  Cardiovascular:     Rate and Rhythm: Normal rate.     Comments: Irregular heart rhythm consistent with atrial fibrillation Pulmonary:     Effort: Pulmonary effort is normal.     Breath sounds: Normal breath sounds.  Skin:    Comments: Avulsion laceration injury of her left anterior leg 1 by 4 cm Her left second toe nail is completely avulsed off.  There are no signs of secondary infection such as erythema.  Neurological:     General: No focal deficit present.     Mental Status: She is alert and oriented to person, place, and time.     Cranial Nerves: No cranial nerve deficit.        Assessment:     #1 recent injury with avulsion type injury left leg and avulsion of left second toenail.  No signs of secondary infection  #2 chronic atrial fibrillation.  Patient has only been taking her Eliquis once daily  #3 tingling and sensory deficits left upper extremity.  No evidence for any motor weakness    Plan:     -Increase Eliquis to 2.5 mg twice daily (pt for unknown reason had been taking only once daily). -Wounds were re-cleaned and redressed.  Continue cleaning at least every other day and follow-up promptly for signs of secondary infection -Follow-up immediately for any speech change, vision loss, upper or lower extremity weakness or any other new symptoms  [name] MD [facility] at [facility]

## 2018-06-14 NOTE — Patient Instructions (Signed)
Increase the Eliquis to one TWICE daily.  Follow up immediately for any slurred speech, vision changes, left arm (or leg) weakness, or any progressive numbness.  Keep left leg clean with soap and water.  Follow-up for any signs of secondary infection such as increased redness or puslike drainage

## 2018-06-24 ENCOUNTER — Other Ambulatory Visit: Payer: Self-pay | Admitting: Family Medicine

## 2018-06-25 NOTE — Telephone Encounter (Signed)
Last filled by ED doctor. Just wanted to make sure patient is still taking this medication and OK to keep filling?

## 2018-06-25 NOTE — Telephone Encounter (Signed)
Refill for 6 months.

## 2018-07-20 ENCOUNTER — Telehealth: Payer: Self-pay | Admitting: *Deleted

## 2018-07-20 NOTE — Telephone Encounter (Signed)
Called to r/s AWV pt states she would rather have in office visit not virtual

## 2018-08-13 ENCOUNTER — Ambulatory Visit: Payer: Self-pay | Admitting: *Deleted

## 2018-08-13 ENCOUNTER — Telehealth: Payer: Self-pay | Admitting: *Deleted

## 2018-08-13 NOTE — Telephone Encounter (Signed)
Patient transferred to Clinic RN. Per patient her cheeks have been swelling x3 days she feels this is d/t the water added to her O2. Per patient has not tried any new foods, denies shortness of breath, or tongue swelling. Patient scheduled with PCP for Monday and advised if swelling increase, or has shortness of breath or difficulty breathing to proceed to ED. Patient verbalized understanding.

## 2018-08-13 NOTE — Telephone Encounter (Signed)
Pt called with c/o swelling in her cheeks. Which states is from the water that she added to her oxygen to keep her nose from drying out.  She uses oxygen at night. A HHRN helped her with using the oxygen and adding water for increase moisture. She stopped adding the water about 3 days ago and her cheeks have not gone done.  She denies fever, redness, itching, shortness of breath, congestion or swelling in her extremities. Advised pt of having a virtual appointment and she stated she is unable to do that. Flow at [facility] notified regarding recommendation for patient. Call conference in with patient. Routed to [facility] at [place] [place] for review.   Reason for Disposition . [1] Mild facial swelling (puffiness) AND [2] persists > 3 days  Answer Assessment - Initial Assessment Questions 1. ONSET: "When did the swelling start?" (e.g., minutes, hours, days)     Over 3 days ago 2. LOCATION: "What part of the face is swollen?"     cheeks 3. SEVERITY: "How swollen is it?"     Looks like fluid on the cheek bone puffed up 4. ITCHING: "Is there any itching?" If so, ask: "How much?"   (Scale 1-10; mild, moderate or severe)     no 5. PAIN: "Is the swelling painful to touch?" If so, ask: "How painful is it?"   (Scale 1-10; mild, moderate or severe)     no 6. FEVER: "Do you have a fever?" If so, ask: "What is it, how was it measured, and when did it start?"      no 7. CAUSE: "What do you think is causing the face swelling?"     Thinks is the water added to her oxygen tank 8. RECURRENT SYMPTOM: "Have you had face swelling before?" If so, ask: "When was the last time?" "What happened that time?"     no 9. OTHER SYMPTOMS: "Do you have any other symptoms?" (e.g., toothache, leg swelling)     no 10. PREGNANCY: "Is there any chance you are pregnant?" "When was your last menstrual period?"       no  Protocols used: [facility]

## 2018-08-16 ENCOUNTER — Other Ambulatory Visit: Payer: Self-pay

## 2018-08-16 ENCOUNTER — Ambulatory Visit (INDEPENDENT_AMBULATORY_CARE_PROVIDER_SITE_OTHER): Payer: Medicare Other | Admitting: Family Medicine

## 2018-08-16 DIAGNOSIS — R600 Localized edema: Secondary | ICD-10-CM | POA: Diagnosis not present

## 2018-08-16 NOTE — Telephone Encounter (Signed)
Please see messages. Pt has an appointment today at 2pm for Doxy.

## 2018-08-16 NOTE — Progress Notes (Signed)
Patient ID: [name] [name], female   DOB: 11/19/1932, 86 y.o.   MRN: [id number]  This visit type was conducted due to national recommendations for restrictions regarding the COVID-19 pandemic in an effort to limit this patient's exposure and mitigate transmission in our community.   Virtual Visit via Telephone Note  I connected with Phineas Inches on 08/16/18 at  2:00 PM EDT by telephone and verified that I am speaking with the correct person using two identifiers.  We attempted video visit but had technical difficulties.  This was converted to audio visit   I discussed the limitations, risks, security and privacy concerns of performing an evaluation and management service by telephone and the availability of in person appointments. I also discussed with the patient that there may be a patient responsible charge related to this service. The patient expressed understanding and agreed to proceed.  Location patient: home Location provider: work or home office Participants present for the call: patient, provider Patient did not have a visit in the prior 7 days to address this/these issue(s).   History of Present Illness: She has multiple chronic problems include hypertension, diastolic heart failure, atrial fibrillation, microscopic polyangiitis, type 2 diabetes, pulmonary hypertension, hypothyroidism, chronic kidney disease.  She called stating for about a month now she has had some increased edema in both cheeks.  She had been on prednisone previously but has been off for quite some time.  She is on nighttime oxygen.  Uses this consistently.  Denies any recent increased leg edema.  No increased dyspnea over baseline.  Denies any facial pain, fevers, chills.  No clear exacerbating or alleviating factors.  No recent change in medications Son states that she has almost somewhat of a "blistered "appearance to the skin   Observations/Objective: Patient sounds cheerful and well on the phone. I do not  appreciate any SOB. Speech and thought processing are grossly intact. Patient reported vitals:  Assessment and Plan:  Bilateral cheek/facial edema.  Etiology unclear.  She does not seem to be having any general water retention issues.  Past history of prednisone use but off this for quite some time.  Follow Up Instructions:  -We recommend office follow-up tomorrow to further assess.  Will actually try to meet them out back.  She is very high risk and we need to reduce her exposure risk is much as possible.  21 minutes spent with patient and son discussing potential etiologies and reviewing signs and symptoms to look for as well as reviewing medications  [id number] [id number] [id number] 21-30 I did not refer this patient for an OV in the next 24 hours for this/these issue(s).  I discussed the assessment and treatment plan with the patient. The patient was provided an opportunity to ask questions and all were answered. The patient agreed with the plan and demonstrated an understanding of the instructions.   The patient was advised to call back or seek an in-person evaluation if the symptoms worsen or if the condition fails to improve as anticipated.  I provided 21 minutes of non-face-to-face time during this encounter.   Carolann Littler, MD

## 2018-08-17 ENCOUNTER — Ambulatory Visit (INDEPENDENT_AMBULATORY_CARE_PROVIDER_SITE_OTHER): Payer: Medicare Other | Admitting: Family Medicine

## 2018-08-17 ENCOUNTER — Other Ambulatory Visit: Payer: Self-pay

## 2018-08-17 DIAGNOSIS — R600 Localized edema: Secondary | ICD-10-CM | POA: Diagnosis not present

## 2018-08-17 MED ORDER — PREDNISONE 10 MG PO TABS: ORAL_TABLET | ORAL | 0 refills | Status: AC

## 2018-08-17 NOTE — Progress Notes (Signed)
Subjective:     Patient ID: [name] [name], female   DOB: May 17, 1932, 85 y.o.   MRN: [id number]  HPI Patient called yesterday with some complaints that she was having some facial edema below both eyes.  Duration of approximately 1 month.  She thought this was initially related to nighttime use of oxygen tubing around her face.  Her edema lasts day and night.  She has not had any peripheral edema otherwise.  No fevers or chills.  No facial rash.  No erythema.  She has not tried any cool compresses.  No pruritus.  No sinus tenderness.  She does have chronic kidney disease but has not had any generalized edema or leg edema.  We did suggest that she come in today so we could visually see this as we had difficulty getting a good look on the video  He does have history of microscopic polyangiitis but has been off prednisone for some time.  No recent make-ups or change of make up  Past Medical History:  Diagnosis Date  . Arthritis    r tkr  . Breast cancer ([place])   . Chronic atrial fibrillation    CHADS2-VASc=6.  on low dose Eliquis (corrected for age & renal fxn)  . CVA (cerebral vascular accident) ([place]) 2008   mini stroke.no residual  . DEPRESSION 05/16/2009  . Diabetes mellitus type II   . Heart murmur    stress test 2009.dr peter [place]  . HYPERLIPIDEMIA 07/24/2008  . HYPERTENSION 07/24/2008  . HYPOTHYROIDISM 07/24/2008  . Intermittent vertigo   . Metastatic carcinoid tumor to intra-abdominal site ([place])   . Pneumonia   . Pulmonary hypertension ([place])    Mod PHTN on Echo 03/2016: 2/2 combination of Wegener's, HFPF & Afib.  . Skin abnormality    facial lesions .pt applying mupiracin to areas  . Vertigo   . 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 BREAST SURGERY  2007   Lumpectomy, XRT 2006.l breast  . CHOLECYSTECTOMY  1980  . EXPLORATORY LAPAROTOMY    . HEEL SPUR SURGERY Right   . JOINT REPLACEMENT     r knee  . KNEE SURGERY  2009    TKR  . TEE WITHOUT CARDIOVERSION N/A 09/16/2017   Procedure: TRANSESOPHAGEAL ECHOCARDIOGRAM (TEE);  Surgeon: Larey Dresser, MD;  Location: [facility] ENDOSCOPY;  Service: Cardiovascular;  Laterality: N/A;  . TRANSTHORACIC ECHOCARDIOGRAM  03/2016   Afib (no Diastolic Fxn). EF 55-60%. No RWMA. Mild Aortic dilation Mild MR. Severe LA dilation. Mod RA dilation.  Mod TR with Mod-Severe Pulmonary HTN - but normal RV function..  . VENTRAL HERNIA REPAIR N/A 07/01/2016   Procedure: REPAIR OF INCARCERATED INCISIONAL HERNIA ;  Surgeon: Stark Klein, MD;  Location: [facility] ORS;  Service: General;  Laterality: N/A;  . VIDEO ASSISTED THORACOSCOPY  04/22/2011   Procedure: VIDEO ASSISTED THORACOSCOPY;  Surgeon: Melrose Nakayama, MD;  Location: [place];  Service: Thoracic;  Laterality: Left;  WITH BIOPSY    reports that she quit smoking about 36 years ago. Her smoking use included cigarettes. She has a 6.00 pack-year smoking history. She has never used smokeless tobacco. She reports that she does not drink alcohol or use drugs. family history includes Arthritis in her mother; Breast cancer in her sister; Cancer in her sister; Coronary artery disease in her brother and father; Heart disease in her father; Lung cancer in her brother; Rheum arthritis in her mother. Allergies  Allergen Reactions  .  Codeine Sulfate Other (See Comments)    REACTION: GI upset  . Other Nausea And Vomiting    REACTION: GI upset  . Sulfonamide Derivatives Other (See Comments)    REACTION: GI upset  . Vancomycin Other (See Comments)    Red man syndrome  . Atarax [Hydroxyzine] Nausea Only and Rash     Review of Systems  Constitutional: Negative for chills and fever.  HENT: Positive for facial swelling. Negative for ear discharge, sinus pressure and sinus pain.   Respiratory: Negative for shortness of breath.   Cardiovascular: Negative for leg swelling.       Objective:   Physical Exam Constitutional:      Appearance: Normal  appearance.  Eyes:     General:        Right eye: No discharge.        Left eye: No discharge.     Extraocular Movements: Extraocular movements intact.     Comments: She has some edema which is confined below the lower lids of both eyes.  There is no erythema.  No warmth.  No tenderness.  Couple bags of fluid below both eyes.  No supra-orbital edema  Cardiovascular:     Rate and Rhythm: Normal rate.  Neurological:     Mental Status: She is alert.        Assessment:     1 month history of some inferior orbital edema.  No evidence for cellulitis changes.  Question allergic trigger.   No generalized edema    Plan:     -Recommend cool compresses several times daily -We will treat with prednisone 30 mg daily for 7 days and monitor blood sugars closely -Touch base if unimproved with the above  [name] MD [facility] at [facility]

## 2018-08-19 ENCOUNTER — Emergency Department (HOSPITAL_COMMUNITY)
Admission: EM | Admit: 2018-08-19 | Discharge: 2018-08-19 | Disposition: A | Discharge status: Home / Self Care | Payer: Medicare Other | Source: Home / Self Care | Attending: Emergency Medicine | Admitting: Emergency Medicine

## 2018-08-19 ENCOUNTER — Encounter (HOSPITAL_COMMUNITY): Payer: Self-pay | Admitting: Emergency Medicine

## 2018-08-19 ENCOUNTER — Other Ambulatory Visit: Payer: Self-pay

## 2018-08-19 ENCOUNTER — Emergency Department (HOSPITAL_COMMUNITY)
Admission: EM | Admit: 2018-08-19 | Discharge: 2018-08-19 | Disposition: A | Discharge status: Home / Self Care | Payer: Medicare Other | Attending: Emergency Medicine | Admitting: Emergency Medicine

## 2018-08-19 DIAGNOSIS — Z853 Personal history of malignant neoplasm of breast: Secondary | ICD-10-CM | POA: Diagnosis not present

## 2018-08-19 DIAGNOSIS — Z7901 Long term (current) use of anticoagulants: Secondary | ICD-10-CM | POA: Insufficient documentation

## 2018-08-19 DIAGNOSIS — R58 Hemorrhage, not elsewhere classified: Secondary | ICD-10-CM | POA: Diagnosis not present

## 2018-08-19 DIAGNOSIS — Z79899 Other long term (current) drug therapy: Secondary | ICD-10-CM | POA: Insufficient documentation

## 2018-08-19 DIAGNOSIS — R040 Epistaxis: Secondary | ICD-10-CM

## 2018-08-19 DIAGNOSIS — Z85060 Personal history of malignant carcinoid tumor of small intestine: Secondary | ICD-10-CM | POA: Insufficient documentation

## 2018-08-19 DIAGNOSIS — I130 Hypertensive heart and chronic kidney disease with heart failure and stage 1 through stage 4 chronic kidney disease, or unspecified chronic kidney disease: Secondary | ICD-10-CM | POA: Insufficient documentation

## 2018-08-19 DIAGNOSIS — Z96651 Presence of right artificial knee joint: Secondary | ICD-10-CM | POA: Insufficient documentation

## 2018-08-19 DIAGNOSIS — I5032 Chronic diastolic (congestive) heart failure: Secondary | ICD-10-CM | POA: Insufficient documentation

## 2018-08-19 DIAGNOSIS — J3489 Other specified disorders of nose and nasal sinuses: Secondary | ICD-10-CM | POA: Diagnosis not present

## 2018-08-19 DIAGNOSIS — Z87891 Personal history of nicotine dependence: Secondary | ICD-10-CM | POA: Insufficient documentation

## 2018-08-19 DIAGNOSIS — Z85038 Personal history of other malignant neoplasm of large intestine: Secondary | ICD-10-CM | POA: Insufficient documentation

## 2018-08-19 DIAGNOSIS — N184 Chronic kidney disease, stage 4 (severe): Secondary | ICD-10-CM | POA: Insufficient documentation

## 2018-08-19 DIAGNOSIS — R0902 Hypoxemia: Secondary | ICD-10-CM | POA: Diagnosis not present

## 2018-08-19 DIAGNOSIS — E1122 Type 2 diabetes mellitus with diabetic chronic kidney disease: Secondary | ICD-10-CM | POA: Insufficient documentation

## 2018-08-19 DIAGNOSIS — E039 Hypothyroidism, unspecified: Secondary | ICD-10-CM | POA: Insufficient documentation

## 2018-08-19 DIAGNOSIS — Z7984 Long term (current) use of oral hypoglycemic drugs: Secondary | ICD-10-CM | POA: Insufficient documentation

## 2018-08-19 DIAGNOSIS — Z9981 Dependence on supplemental oxygen: Secondary | ICD-10-CM | POA: Diagnosis not present

## 2018-08-19 DIAGNOSIS — E1149 Type 2 diabetes mellitus with other diabetic neurological complication: Secondary | ICD-10-CM | POA: Insufficient documentation

## 2018-08-19 DIAGNOSIS — I10 Essential (primary) hypertension: Secondary | ICD-10-CM | POA: Diagnosis not present

## 2018-08-19 MED ORDER — OXYMETAZOLINE HCL 0.05 % NA SOLN
1.0000 | Freq: Once | NASAL | Status: AC
  Administered 2018-08-19: 1 via NASAL
  Filled 2018-08-19: qty 30

## 2018-08-19 MED ORDER — ONDANSETRON HCL 4 MG/2ML IJ SOLN
4.0000 mg | Freq: Once | INTRAMUSCULAR | Status: AC
  Administered 2018-08-19: 4 mg via INTRAVENOUS
  Filled 2018-08-19: qty 2

## 2018-08-19 MED ORDER — LIDOCAINE VISCOUS HCL 2 % MT SOLN
15.0000 mL | Freq: Once | OROMUCOSAL | Status: AC
  Administered 2018-08-19: 15 mL via OROMUCOSAL
  Filled 2018-08-19: qty 15

## 2018-08-19 MED ORDER — AMOXICILLIN-POT CLAVULANATE 875-125 MG PO TABS: 1.0000 | ORAL_TABLET | Freq: Two times a day (BID) | ORAL | 0 refills | Status: AC

## 2018-08-19 MED ORDER — LIDOCAINE-EPINEPHRINE (PF) 2 %-1:200000 IJ SOLN
20.0000 mL | Freq: Once | INTRAMUSCULAR | Status: AC
  Administered 2018-08-19: 20 mL
  Filled 2018-08-19: qty 20

## 2018-08-19 NOTE — Discharge Instructions (Signed)
Please follow-up with your ear nose and throat physician to have your nosebleeds evaluated.  Return for continued bleeding.

## 2018-08-19 NOTE — Consult Note (Signed)
Reason for Consult: Epistaxis Referring Physician: Deno Etienne, DO  [name] [name] is an 85 y.o. female.  HPI: History of chronic heart disease, on supplemental oxygen via nasal cannula.  She has had problems with nosebleeds in the past.  Recommendations have been made for her to have humidified oxygen at all times.  She was recently taken off her humidification.  She is also on blood thinner.  She has been having intermittent epistaxis since late last night.  She has been to the emergency department a couple of times and had packing placed on both sides but continues to bleed.  Past Medical History:  Diagnosis Date  . Arthritis    r tkr  . Breast cancer ([place])   . Chronic atrial fibrillation    CHADS2-VASc=6.  on low dose Eliquis (corrected for age & renal fxn)  . CVA (cerebral vascular accident) ([place]) 2008   mini stroke.no residual  . DEPRESSION 05/16/2009  . Diabetes mellitus type II   . Heart murmur    stress test 2009.dr peter [place]  . HYPERLIPIDEMIA 07/24/2008  . HYPERTENSION 07/24/2008  . HYPOTHYROIDISM 07/24/2008  . Intermittent vertigo   . Metastatic carcinoid tumor to intra-abdominal site ([place])   . Pneumonia   . Pulmonary hypertension ([place])    Mod PHTN on Echo 03/2016: 2/2 combination of Wegener's, HFPF & Afib.  . Skin abnormality    facial lesions .pt applying mupiracin to areas  . Vertigo   . 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 BREAST SURGERY  2007   Lumpectomy, XRT 2006.l breast  . CHOLECYSTECTOMY  1980  . EXPLORATORY LAPAROTOMY    . HEEL SPUR SURGERY Right   . JOINT REPLACEMENT     r knee  . KNEE SURGERY  2009   TKR  . TEE WITHOUT CARDIOVERSION N/A 09/16/2017   Procedure: TRANSESOPHAGEAL ECHOCARDIOGRAM (TEE);  Surgeon: Larey Dresser, MD;  Location: [facility] ENDOSCOPY;  Service: Cardiovascular;  Laterality: N/A;  . TRANSTHORACIC ECHOCARDIOGRAM  03/2016   Afib (no Diastolic Fxn). EF 55-60%. No RWMA. Mild  Aortic dilation Mild MR. Severe LA dilation. Mod RA dilation.  Mod TR with Mod-Severe Pulmonary HTN - but normal RV function..  . VENTRAL HERNIA REPAIR N/A 07/01/2016   Procedure: REPAIR OF INCARCERATED INCISIONAL HERNIA ;  Surgeon: Stark Klein, MD;  Location: [facility] ORS;  Service: General;  Laterality: N/A;  . VIDEO ASSISTED THORACOSCOPY  04/22/2011   Procedure: VIDEO ASSISTED THORACOSCOPY;  Surgeon: Melrose Nakayama, MD;  Location: [facility] OR;  Service: Thoracic;  Laterality: Left;  WITH BIOPSY    Family History  Problem Relation Age of Onset  . Arthritis Mother   . Rheum arthritis Mother   . Heart disease Father   . Coronary artery disease Father   . Cancer Sister        breast CA, both sisters  . Breast cancer Sister   . Coronary artery disease Brother   . Lung cancer Brother     Social History:  reports that she quit smoking about 36 years ago. Her smoking use included cigarettes. She has a 6.00 pack-year smoking history. She has never used smokeless tobacco. She reports that she does not drink alcohol or use drugs.  Allergies:  Allergies  Allergen Reactions  . Codeine Sulfate Other (See Comments)    REACTION: GI upset  . Other Nausea And Vomiting    REACTION: GI upset  . Sulfonamide Derivatives Other (See Comments)  REACTION: GI upset  . Vancomycin Other (See Comments)    Red man syndrome  . Atarax [Hydroxyzine] Nausea Only and Rash    Medications: Reviewed  No results found for this or any previous visit (from the past 35 hour(s)).  No results found.  XQK:SKSHNGIT except as listed in admit H&P  Blood pressure (!) 161/84, pulse 92, temperature (!) 97.4 F (36.3 C), temperature source Oral, resp. rate 18, height _0  (1.676 m), weight 64.9 kg, SpO2 94 %.  PHYSICAL EXAM: Overall appearance:  Healthy appearing, in no distress, with bilateral inflatable nasal packs in place. Head:  Normocephalic, atraumatic. Ears: External ears look normal. Nose: External nose is  healthy in appearance. Oral Cavity/Pharynx:  There are no mucosal lesions or masses identified.  Clots were cleared out of the oropharynx and nasopharynx. Larynx/Hypopharynx: Deferred Neuro:  No identifiable neurologic deficits. Neck: No palpable neck masses.  Studies Reviewed: none  Procedures: The nasal cavities were inspected and both inflatable packs were removed.  Large clots were cleaned out of both sides of the nasal cavities and the nasopharynx.  She felt much better.  There was no obvious bleeding.  Topical Xylocaine/Afrin was applied to both nasal cavities for decongestant effect and for topical anesthesia.  3 suspected bleeding sites on the right side were cauterized with silver nitrate.  2 on the septum and one on the confluence of the anterior septum and the roof of the nose.  There is no signs of polyposis.  There was a significant nasal septal spur to the right about halfway back.  A Slimline Merocel was inserted without difficulty and inflated with the topical anesthetic solution.  She tolerated this well.   Assessment/Plan: Epistaxis secondary to nasal dryness from nasal prong oxygen and blood thinner therapy.  Several sites were cauterized in the right nasal cavity but am not sure if any of these were the actual source.  I placed a Slimline Merocel pack in the right side.  There did not seem to be any bleeding from the left.  I would like her to be observed in the emergency department for an hour and if there is no further bleeding she may go home and I will have her return on Tuesday for packing removal.  I will return if there is additional problems.  [name] [name] 08/19/2018, 1:47 PM

## 2018-08-19 NOTE — ED Notes (Addendum)
Patient assisted onto bedpan for urination.

## 2018-08-19 NOTE — ED Notes (Signed)
Bed: LM76 Expected date:  Expected time:  Means of arrival:  Comments: EMS: 85 yo epitaxis

## 2018-08-19 NOTE — ED Notes (Signed)
ENT provider at bedside.

## 2018-08-19 NOTE — ED Notes (Signed)
Patient's Son, Delfino Lovett is coming to pick patient up. Patient will rest in room until her ride gets here.

## 2018-08-19 NOTE — Discharge Instructions (Addendum)
Follow-up with ENT in the next 1 to 2 days.  The contact information for Dr. Wilburn Cornelia has been provided in this discharge summary for you to call and make these arrangements.  Return to the ER in the meantime if you develop worsening bleeding or other new and concerning symptoms.

## 2018-08-19 NOTE — ED Notes (Signed)
Son called, he will call when he arrives to pick up his mother.

## 2018-08-19 NOTE — ED Triage Notes (Signed)
Arrived via EMS from home. Wears O2 at night, went to lay down and noticed her nosebleed, going on for about an hour, hx of nosebleeds in the past, hx HTN, no pain. CBG 120.

## 2018-08-19 NOTE — ED Provider Notes (Signed)
[facility] DEPT Provider Note   CSN: [id number] Arrival date & time: 08/19/18  0227    History   Chief Complaint Chief Complaint  Patient presents with  . Epistaxis    HPI [name] [name] is a 85 y.o. female.     Patient is an 85 year old female with past medical history of atrial fibrillation, diabetes, hypertension, hyperlipidemia.  She presents today for evaluation of nosebleed.  She woke this morning with blood trickling from her right nostril.  She denies any injury or trauma.  She denies any other complaints.  She was brought here by EMS holding direct pressure.  The history is provided by the patient.  Epistaxis  Location:  R nare Severity:  Moderate Timing:  Constant Progression:  Partially resolved Chronicity:  New Context: anticoagulants   Relieved by:  Nothing Worsened by:  Nothing Ineffective treatments:  None tried   Past Medical History:  Diagnosis Date  . Arthritis    r tkr  . Breast cancer ([place])   . Chronic atrial fibrillation    CHADS2-VASc=6.  on low dose Eliquis (corrected for age & renal fxn)  . CVA (cerebral vascular accident) ([place]) 2008   mini stroke.no residual  . DEPRESSION 05/16/2009  . Diabetes mellitus type II   . Heart murmur    stress test 2009.dr peter [place]  . HYPERLIPIDEMIA 07/24/2008  . HYPERTENSION 07/24/2008  . HYPOTHYROIDISM 07/24/2008  . Intermittent vertigo   . Metastatic carcinoid tumor to intra-abdominal site ([place])   . Pneumonia   . Pulmonary hypertension ([place])    Mod PHTN on Echo 03/2016: 2/2 combination of Wegener's, HFPF & Afib.  . Skin abnormality    facial lesions .pt applying mupiracin to areas  . Vertigo   . 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 Epistaxis 03/29/2018  . Acute renal failure superimposed on stage 4 chronic kidney disease ([place]) 03/20/2018  . Pneumonia 03/15/2018  . Community acquired pneumonia 09/17/2017   . [facility] 09/16/2017  . Acute on chronic respiratory failure with hypoxia ([place]) 09/29/2016  . Malnutrition of moderate degree 07/01/2016  . Incarcerated hernia 06/30/2016  . Recurrent umbilical hernia with incarceration 06/30/2016  . Cellulitis and abscess   . Diabetic foot ulcer ([place]) 12/10/2015  . Hereditary and idiopathic peripheral neuropathy 09/25/2014  . Dependent edema 09/25/2014  . UTI (lower urinary tract infection)   . Type 2 diabetes mellitus with neurological complications ([place]) 03/47/4259  . Chronic atrial fibrillation (HCC) CHADS2-VASc=6.  On Corrected "low" dose of Eliquis 04/04/2014  . CKD (chronic kidney disease) stage 3, GFR 30-59 ml/min (HCC) 04/04/2014  . Abdominal hernia without obstruction or gangrene 01/09/2014  . Nausea alone 08/21/2013  . Diastolic CHF, chronic (HCC) - on low dose Lasix. ARB & BB along with Norvasc 08/18/2013  . Sepsis ([place]) 08/18/2013  . Hand pain, left 08/18/2013  . Chronic kidney disease (CKD), stage IV (severe) ([place]) 05/08/2013  . Normocytic anemia 05/08/2013  . Pulmonary hypertension ([place]) 05/07/2013  . Right-sided heart failure ([place]) 05/07/2013  . Pancreatic cyst 04/17/2013  . Acute respiratory failure with hypoxemia ([place]) 04/13/2013  . Obesity (BMI 30-39.9) 12/28/2012  . Wegener's granulomatosis (granulomatosis with polyangiitis) ([place]) 11/02/2012  . Cough with hemoptysis 11/01/2012  . Anemia 11/01/2012  . Dehydration 11/01/2012  . Diffuse pulmonary alveolar hemorrhage 11/01/2012  . Breast cancer ([place]) 09/14/2012  . Pruritus of skin 05/20/2012  . Carcinoid tumor of colon,  malignant ([place]) 05/16/2011  . Multifocal pneumonia 04/05/2011  . Vertigo, intermittent 10/21/2010  . Edema 03/25/2010  . Hyponatremia 02/13/2010  . DEPRESSION 05/16/2009  . Hypothyroidism 07/24/2008  . Hyperlipidemia 07/24/2008  . Essential hypertension 07/24/2008  . OSTEOARTHRITIS 07/24/2008    Past Surgical History:  Procedure  Laterality Date  . BREAST SURGERY  2007   Lumpectomy, XRT 2006.l breast  . CHOLECYSTECTOMY  1980  . EXPLORATORY LAPAROTOMY    . HEEL SPUR SURGERY Right   . JOINT REPLACEMENT     r knee  . KNEE SURGERY  2009   TKR  . TEE WITHOUT CARDIOVERSION N/A 09/16/2017   Procedure: TRANSESOPHAGEAL ECHOCARDIOGRAM (TEE);  Surgeon: Larey Dresser, MD;  Location: [facility] ENDOSCOPY;  Service: Cardiovascular;  Laterality: N/A;  . TRANSTHORACIC ECHOCARDIOGRAM  03/2016   Afib (no Diastolic Fxn). EF 55-60%. No RWMA. Mild Aortic dilation Mild MR. Severe LA dilation. Mod RA dilation.  Mod TR with Mod-Severe Pulmonary HTN - but normal RV function..  . VENTRAL HERNIA REPAIR N/A 07/01/2016   Procedure: REPAIR OF INCARCERATED INCISIONAL HERNIA ;  Surgeon: Stark Klein, MD;  Location: [facility] ORS;  Service: General;  Laterality: N/A;  . VIDEO ASSISTED THORACOSCOPY  04/22/2011   Procedure: VIDEO ASSISTED THORACOSCOPY;  Surgeon: Melrose Nakayama, MD;  Location: [facility];  Service: Thoracic;  Laterality: Left;  WITH BIOPSY     OB History   No obstetric history on file.      Home Medications    Prior to Admission medications   Medication Sig Start Date End Date Taking? Authorizing Provider  acetaminophen (TYLENOL) 500 MG tablet Take 1,000 mg by mouth every 6 (six) hours as needed for headache.     [provider]  amLODipine (NORVASC) 5 MG tablet Take 5 mg by mouth at bedtime.  09/25/17   [provider]  apixaban (ELIQUIS) 2.5 MG TABS tablet Take 2.5 mg by mouth 2 (two) times daily.    [provider]  BYSTOLIC 10 MG tablet TAKE 1 TABLET BY MOUTH ONCE DAILY AFTER  BREAKFAST 02/22/18   Burchette, Alinda Sierras, MD  diazepam (VALIUM) 5 MG tablet TAKE 1 TABLET BY MOUTH EVERY 12 HOURS AS NEEDED FOR SEVERE VERTIGO FLARES. AVOID REGULAR USE. 11/02/17   Burchette, Alinda Sierras, MD  famotidine (PEPCID) 10 MG tablet Take 1 tablet (10 mg total) by mouth 2 (two) times daily. 03/21/18   Eugenie Filler, MD  feeding  supplement, ENSURE ENLIVE, (ENSURE ENLIVE) LIQD Take 237 mLs by mouth 2 (two) times daily between meals. 09/18/17   Debbe Odea, MD  furosemide (LASIX) 20 MG tablet Take 1 tablet (20 mg total) by mouth daily as needed for fluid or edema. 06/25/18   Burchette, Alinda Sierras, MD  gabapentin (NEURONTIN) 100 MG capsule TAKE 1 CAPSULE BY MOUTH TWICE DAILY BEFORE MEAL(S) 06/01/18   Burchette, Alinda Sierras, MD  glimepiride (AMARYL) 2 MG tablet TAKE ONE TABLET BY MOUTH ONCE DAILY BEFORE BREAKFAST Patient taking differently: Take 2 mg by mouth daily as needed (high blood sugar).  10/07/16   Burchette, Alinda Sierras, MD  guaiFENesin (MUCINEX) 600 MG 12 hr tablet Take 1 tablet (600 mg total) by mouth 2 (two) times daily as needed for cough. Patient not taking: Reported on 06/14/2018 09/17/17   Debbe Odea, MD  levothyroxine (SYNTHROID, LEVOTHROID) 100 MCG tablet TAKE 1 TABLET BY MOUTH ONCE DAILY BEFORE BREAKFAST. 07/27/17   Burchette, Alinda Sierras, MD  loratadine (CLARITIN) 10 MG tablet Take 10 mg by mouth daily.  [provider]  Multiple Vitamins-Minerals (CENTRUM SILVER 50+WOMEN) TABS Take 1 tablet by mouth daily.     [provider]  ondansetron (ZOFRAN-ODT) 4 MG disintegrating tablet Take 1 tablet (4 mg total) by mouth every 8 (eight) hours as needed for nausea or vomiting. 09/18/17   Burchette, Alinda Sierras, MD  oxymetazoline (AFRIN) 0.05 % nasal spray Place 1 spray into both nostrils 2 (two) times daily as needed for congestion.    [provider]  predniSONE (DELTASONE) 10 MG tablet Take three tablets once daily for 7 days. 08/17/18   Burchette, Alinda Sierras, MD  predniSONE (DELTASONE) 20 MG tablet Take 2 tablets (40 mg total) by mouth daily with breakfast. 03/22/18   Eugenie Filler, MD    Family History Family History  Problem Relation Age of Onset  . Arthritis Mother   . Rheum arthritis Mother   . Heart disease Father   . Coronary artery disease Father   . Cancer Sister        breast CA, both  sisters  . Breast cancer Sister   . Coronary artery disease Brother   . Lung cancer Brother     Social History Social History   Tobacco Use  . Smoking status: Former Smoker    Packs/day: 0.50    Years: 12.00    Pack years: 6.00    Types: Cigarettes    Last attempt to quit: 06/04/1982    Years since quitting: 36.2  . Smokeless tobacco: Never Used  Substance Use Topics  . Alcohol use: No  . Drug use: No     Allergies   Codeine sulfate; Other; Sulfonamide derivatives; Vancomycin; and Atarax [hydroxyzine]   Review of Systems Review of Systems  HENT: Positive for nosebleeds.   All other systems reviewed and are negative.    Physical Exam Updated Vital Signs BP (!) 150/109 (BP Location: Left Arm)   Pulse 71   Temp 97.6 F (36.4 C) (Oral)   Resp 20   Ht _0  (1.676 m)   Wt 65.3 kg   SpO2 94%   BMI 23.24 kg/m   Physical Exam Vitals signs and nursing note reviewed.  Constitutional:      General: She is not in acute distress.    Appearance: She is well-developed. She is not diaphoretic.  HENT:     Head: Normocephalic and atraumatic.     Nose:     Comments: There is some blood noted in the right nares, however there is no active bleeding and no visible abnormality. Neck:     Musculoskeletal: Normal range of motion and neck supple.  Cardiovascular:     Rate and Rhythm: Normal rate and regular rhythm.     Heart sounds: No murmur. No friction rub. No gallop.   Pulmonary:     Effort: Pulmonary effort is normal. No respiratory distress.     Breath sounds: Normal breath sounds. No wheezing.  Abdominal:     General: Bowel sounds are normal. There is no distension.     Palpations: Abdomen is soft.     Tenderness: There is no abdominal tenderness.  Musculoskeletal: Normal range of motion.  Skin:    General: Skin is warm and dry.  Neurological:     Mental Status: She is alert and oriented to person, place, and time.      ED Treatments / Results  Labs (all  labs ordered are listed, but only abnormal results are displayed) Labs Reviewed - No data to display  EKG None  Radiology No results found.  Procedures Procedures (including critical care time)  Medications Ordered in ED Medications  oxymetazoline (AFRIN) 0.05 % nasal spray 1 spray (1 spray Each Nare Given by Other 08/19/18 0253)     Initial Impression / Assessment and Plan / ED Course  I have reviewed the triage vital signs and the nursing notes.  Pertinent labs & imaging results that were available during my care of the patient were reviewed by me and considered in my medical decision making (see chart for details).  Patient presenting with complaints of nosebleed..  This began in the absence of any injury or trauma.  She has a slight trickle from the right nares.  A Rhino Rocket was placed with temporary hemostasis, however it did begin bleeding again.  This was removed and a packing with Vaseline gauze and Afrin was placed.  This had good results.  At this point patient has been observed and is not having any additional bleeding.  I feel as though discharge with outpatient follow-up is appropriate.  To return as needed for any problems.  Final Clinical Impressions(s) / ED Diagnoses   Final diagnoses:  None    ED Discharge Orders    None       Veryl Speak, MD 08/19/18 [id number]

## 2018-08-19 NOTE — ED Triage Notes (Signed)
Per EMS: Pt has been here x3 in the last 12 hours. PT is having uncontrolled epistaxis. EDP at bedside

## 2018-08-19 NOTE — ED Notes (Signed)
Patient was discharged earlier for same. Pt had blood coming from both nostrils and some blood coming from the right eye area. RN and Westly Pam, Cleaned patient's face.

## 2018-08-19 NOTE — ED Notes (Signed)
Unable to get oral temp on patient due to blood in mouth.

## 2018-08-19 NOTE — ED Triage Notes (Addendum)
C/C continual epistaxis, patient was told to return if blood starting dripping from her nose, so she returned. Was seen and DC'ed today, 08/19/2018.

## 2018-08-19 NOTE — ED Provider Notes (Addendum)
[facility] DEPT Provider Note   CSN: [id number] Arrival date & time: 08/19/18  1149    History   Chief Complaint Chief Complaint  Patient presents with  . Epistaxis    HPI [name] [name] is a 85 y.o. female.     85 yo F with a chief complaint of epistaxis.  This is the patient's third visit now in the past 6 hours.  I personally saw the patient in her last visit and she had 2 anterior packings placed.  She had initial cessation of bleeding but said that she coughed and then felt that it started bleeding again.  Very minimal if any bleeding anteriorly the patient feels that it is running down the back of her throat.  The history is provided by the patient.  Epistaxis  Location:  Bilateral Severity:  Severe Duration:  2 hours Timing:  Constant Progression:  Worsening Chronicity:  Recurrent Relieved by:  Nothing Worsened by:  Nothing Ineffective treatments:  None tried Associated symptoms: blood in oropharynx   Associated symptoms: no congestion, no dizziness, no fever and no headaches     Past Medical History:  Diagnosis Date  . Arthritis    r tkr  . Breast cancer ([place])   . Chronic atrial fibrillation    CHADS2-VASc=6.  on low dose Eliquis (corrected for age & renal fxn)  . CVA (cerebral vascular accident) ([place]) 2008   mini stroke.no residual  . DEPRESSION 05/16/2009  . Diabetes mellitus type II   . Heart murmur    stress test 2009.dr peter [place]  . HYPERLIPIDEMIA 07/24/2008  . HYPERTENSION 07/24/2008  . HYPOTHYROIDISM 07/24/2008  . Intermittent vertigo   . Metastatic carcinoid tumor to intra-abdominal site ([place])   . Pneumonia   . Pulmonary hypertension ([place])    Mod PHTN on Echo 03/2016: 2/2 combination of Wegener's, HFPF & Afib.  . Skin abnormality    facial lesions .pt applying mupiracin to areas  . Vertigo   . 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 Epistaxis  03/29/2018  . Acute renal failure superimposed on stage 4 chronic kidney disease ([place]) 03/20/2018  . Pneumonia 03/15/2018  . Community acquired pneumonia 09/17/2017  . [facility] 09/16/2017  . Acute on chronic respiratory failure with hypoxia ([place]) 09/29/2016  . Malnutrition of moderate degree 07/01/2016  . Incarcerated hernia 06/30/2016  . Recurrent umbilical hernia with incarceration 06/30/2016  . Cellulitis and abscess   . Diabetic foot ulcer ([place]) 12/10/2015  . Hereditary and idiopathic peripheral neuropathy 09/25/2014  . Dependent edema 09/25/2014  . UTI (lower urinary tract infection)   . Type 2 diabetes mellitus with neurological complications ([place]) 17/61/6073  . Chronic atrial fibrillation (HCC) CHADS2-VASc=6.  On Corrected "low" dose of Eliquis 04/04/2014  . CKD (chronic kidney disease) stage 3, GFR 30-59 ml/min (HCC) 04/04/2014  . Abdominal hernia without obstruction or gangrene 01/09/2014  . Nausea alone 08/21/2013  . Diastolic CHF, chronic (HCC) - on low dose Lasix. ARB & BB along with Norvasc 08/18/2013  . Sepsis ([place]) 08/18/2013  . Hand pain, left 08/18/2013  . Chronic kidney disease (CKD), stage IV (severe) ([place]) 05/08/2013  . Normocytic anemia 05/08/2013  . Pulmonary hypertension ([place]) 05/07/2013  . Right-sided heart failure ([place]) 05/07/2013  . Pancreatic cyst 04/17/2013  . Acute respiratory failure with hypoxemia ([place]) 04/13/2013  . Obesity (BMI 30-39.9) 12/28/2012  . Wegener's granulomatosis (granulomatosis with polyangiitis) ([place]) 11/02/2012  .  Cough with hemoptysis 11/01/2012  . Anemia 11/01/2012  . Dehydration 11/01/2012  . Diffuse pulmonary alveolar hemorrhage 11/01/2012  . Breast cancer ([place]) 09/14/2012  . Pruritus of skin 05/20/2012  . Carcinoid tumor of colon, malignant ([place]) 05/16/2011  . Multifocal pneumonia 04/05/2011  . Vertigo, intermittent 10/21/2010  . Edema 03/25/2010  . Hyponatremia 02/13/2010  . DEPRESSION 05/16/2009  .  Hypothyroidism 07/24/2008  . Hyperlipidemia 07/24/2008  . Essential hypertension 07/24/2008  . OSTEOARTHRITIS 07/24/2008    Past Surgical History:  Procedure Laterality Date  . BREAST SURGERY  2007   Lumpectomy, XRT 2006.l breast  . CHOLECYSTECTOMY  1980  . EXPLORATORY LAPAROTOMY    . HEEL SPUR SURGERY Right   . JOINT REPLACEMENT     r knee  . KNEE SURGERY  2009   TKR  . TEE WITHOUT CARDIOVERSION N/A 09/16/2017   Procedure: TRANSESOPHAGEAL ECHOCARDIOGRAM (TEE);  Surgeon: Larey Dresser, MD;  Location: [facility] ENDOSCOPY;  Service: Cardiovascular;  Laterality: N/A;  . TRANSTHORACIC ECHOCARDIOGRAM  03/2016   Afib (no Diastolic Fxn). EF 55-60%. No RWMA. Mild Aortic dilation Mild MR. Severe LA dilation. Mod RA dilation.  Mod TR with Mod-Severe Pulmonary HTN - but normal RV function..  . VENTRAL HERNIA REPAIR N/A 07/01/2016   Procedure: REPAIR OF INCARCERATED INCISIONAL HERNIA ;  Surgeon: Stark Klein, MD;  Location: [facility] ORS;  Service: General;  Laterality: N/A;  . VIDEO ASSISTED THORACOSCOPY  04/22/2011   Procedure: VIDEO ASSISTED THORACOSCOPY;  Surgeon: Melrose Nakayama, MD;  Location: [facility];  Service: Thoracic;  Laterality: Left;  WITH BIOPSY     OB History   No obstetric history on file.      Home Medications    Prior to Admission medications   Medication Sig Start Date End Date Taking? Authorizing Provider  acetaminophen (TYLENOL) 500 MG tablet Take 1,000 mg by mouth every 6 (six) hours as needed for headache.     [provider]  amLODipine (NORVASC) 5 MG tablet Take 5 mg by mouth at bedtime.  09/25/17   [provider]  amoxicillin-clavulanate (AUGMENTIN) 875-125 MG tablet Take 1 tablet by mouth 2 (two) times daily. One po bid x 7 days 08/19/18   Deno Etienne, DO  apixaban (ELIQUIS) 2.5 MG TABS tablet Take 2.5 mg by mouth 2 (two) times daily.    [provider]  BYSTOLIC 10 MG tablet TAKE 1 TABLET BY MOUTH ONCE DAILY AFTER  BREAKFAST Patient taking  differently: Take 10 mg by mouth daily.  02/22/18   Burchette, Alinda Sierras, MD  diazepam (VALIUM) 5 MG tablet TAKE 1 TABLET BY MOUTH EVERY 12 HOURS AS NEEDED FOR SEVERE VERTIGO FLARES. AVOID REGULAR USE. Patient taking differently: Take 5 mg by mouth every 12 (twelve) hours as needed (vertigo flares).  11/02/17   Burchette, Alinda Sierras, MD  famotidine (PEPCID) 10 MG tablet Take 1 tablet (10 mg total) by mouth 2 (two) times daily. 03/21/18   Eugenie Filler, MD  feeding supplement, ENSURE ENLIVE, (ENSURE ENLIVE) LIQD Take 237 mLs by mouth 2 (two) times daily between meals. 09/18/17   Debbe Odea, MD  furosemide (LASIX) 20 MG tablet Take 1 tablet (20 mg total) by mouth daily as needed for fluid or edema. 06/25/18   Burchette, Alinda Sierras, MD  gabapentin (NEURONTIN) 100 MG capsule TAKE 1 CAPSULE BY MOUTH TWICE DAILY BEFORE MEAL(S) Patient taking differently: Take 100 mg by mouth 2 (two) times daily.  06/01/18   Burchette, Alinda Sierras, MD  glimepiride (AMARYL) 2  MG tablet TAKE ONE TABLET BY MOUTH ONCE DAILY BEFORE BREAKFAST Patient taking differently: Take 2 mg by mouth daily as needed (high blood sugar).  10/07/16   Burchette, Alinda Sierras, MD  guaiFENesin (MUCINEX) 600 MG 12 hr tablet Take 1 tablet (600 mg total) by mouth 2 (two) times daily as needed for cough. Patient not taking: Reported on 06/14/2018 09/17/17   Debbe Odea, MD  levothyroxine (SYNTHROID, LEVOTHROID) 100 MCG tablet TAKE 1 TABLET BY MOUTH ONCE DAILY BEFORE BREAKFAST. Patient taking differently: Take 100 mcg by mouth daily before breakfast.  07/27/17   Burchette, Alinda Sierras, MD  loratadine (CLARITIN) 10 MG tablet Take 10 mg by mouth daily.    [provider]  Multiple Vitamins-Minerals (CENTRUM SILVER 50+WOMEN) TABS Take 1 tablet by mouth daily.     [provider]  ondansetron (ZOFRAN-ODT) 4 MG disintegrating tablet Take 1 tablet (4 mg total) by mouth every 8 (eight) hours as needed for nausea or vomiting. 09/18/17   Burchette, Alinda Sierras, MD   oxymetazoline (AFRIN) 0.05 % nasal spray Place 1 spray into both nostrils 2 (two) times daily as needed for congestion.    [provider]  predniSONE (DELTASONE) 10 MG tablet Take three tablets once daily for 7 days. Patient taking differently: 30 mg daily with breakfast. Take three tablets once daily for 7 days. 08/17/18   Burchette, Alinda Sierras, MD  predniSONE (DELTASONE) 20 MG tablet Take 2 tablets (40 mg total) by mouth daily with breakfast. 03/22/18   Eugenie Filler, MD    Family History Family History  Problem Relation Age of Onset  . Arthritis Mother   . Rheum arthritis Mother   . Heart disease Father   . Coronary artery disease Father   . Cancer Sister        breast CA, both sisters  . Breast cancer Sister   . Coronary artery disease Brother   . Lung cancer Brother     Social History Social History   Tobacco Use  . Smoking status: Former Smoker    Packs/day: 0.50    Years: 12.00    Pack years: 6.00    Types: Cigarettes    Last attempt to quit: 06/04/1982    Years since quitting: 36.2  . Smokeless tobacco: Never Used  Substance Use Topics  . Alcohol use: No  . Drug use: No     Allergies   Codeine sulfate; Other; Sulfonamide derivatives; Vancomycin; and Atarax [hydroxyzine]   Review of Systems Review of Systems  Constitutional: Negative for chills and fever.  HENT: Positive for nosebleeds. Negative for congestion and rhinorrhea.   Eyes: Negative for redness and visual disturbance.  Respiratory: Negative for shortness of breath and wheezing.   Cardiovascular: Negative for chest pain and palpitations.  Gastrointestinal: Negative for nausea and vomiting.  Genitourinary: Negative for dysuria and urgency.  Musculoskeletal: Negative for arthralgias and myalgias.  Skin: Negative for pallor and wound.  Neurological: Negative for dizziness and headaches.     Physical Exam Updated Vital Signs BP (!) 161/84   Pulse 92   Temp (!) 97.4 F (36.3 C)  (Oral)   Resp 18   Ht _0  (1.676 m)   Wt 64.9 kg   SpO2 94%   BMI 23.08 kg/m   Physical Exam Vitals signs and nursing note reviewed.  Constitutional:      General: She is not in acute distress.    Appearance: She is well-developed. She is not diaphoretic.  HENT:  Head: Normocephalic and atraumatic.     Nose:     Comments: Oozing of blood around bilateral naris.  Blood noted in the posterior oropharynx.  Eyes:     Pupils: Pupils are equal, round, and reactive to light.  Neck:     Musculoskeletal: Normal range of motion and neck supple.  Cardiovascular:     Rate and Rhythm: Normal rate and regular rhythm.     Heart sounds: No murmur. No friction rub. No gallop.   Pulmonary:     Effort: Pulmonary effort is normal.     Breath sounds: No wheezing or rales.  Abdominal:     General: There is no distension.     Palpations: Abdomen is soft.     Tenderness: There is no abdominal tenderness.  Musculoskeletal:        General: No tenderness.  Skin:    General: Skin is warm and dry.  Neurological:     Mental Status: She is alert and oriented to person, place, and time.  Psychiatric:        Behavior: Behavior normal.      ED Treatments / Results  Labs (all labs ordered are listed, but only abnormal results are displayed) Labs Reviewed - No data to display  EKG None  Radiology No results found.  Procedures .Epistaxis Management Date/Time: 08/19/2018 2:08 PM Performed by: Deno Etienne, DO Authorized by: Deno Etienne, DO   Consent:    Consent obtained:  Verbal   Consent given by:  Patient   Risks discussed:  Bleeding   Alternatives discussed:  No treatment, delayed treatment and alternative treatment Anesthesia (see MAR for exact dosages):    Anesthesia method:  None Procedure details:    Treatment site:  R anterior and L anterior   Treatment method:  Anterior pack   Treatment complexity:  Limited   Treatment episode: recurring   Post-procedure details:     Assessment:  Bleeding stopped   Patient tolerance of procedure:  Tolerated well, no immediate complications   (including critical care time)  Medications Ordered in ED Medications  ondansetron (ZOFRAN) injection 4 mg (4 mg Intravenous Given 08/19/18 1213)  lidocaine (XYLOCAINE) 2 % viscous mouth solution 15 mL (15 mLs Mouth/Throat Given 08/19/18 1215)     Initial Impression / Assessment and Plan / ED Course  I have reviewed the triage vital signs and the nursing notes.  Pertinent labs & imaging results that were available during my care of the patient were reviewed by me and considered in my medical decision making (see chart for details).        85 yo F with a chief complaint of epistaxis.  Started last night.  Patient's third visit to the ED.  At this point since she is failed 2 separate attempts will discuss with ENT.  I did add air to both movements with resolution of bleeding.  I was able to stop the bleeding again.  I discussed the case with Dr. Constance Holster who was going to see the patient this afternoon.  Unfortunately the patient has no transportation to get to see him in the office.  He will come by the ED and evaluate the patient at bedside.  Bleeding controlled.  D/c home. ENT follow up.  2:09 PM:  I have discussed the diagnosis/risks/treatment options with the patient and believe the pt to be eligible for discharge home to follow-up with ENT. We also discussed returning to the ED immediately if new or worsening sx occur. We discussed  the sx which are most concerning (e.g., sudden worsening pain, fever, inability to tolerate by mouth) that necessitate immediate return. Medications administered to the patient during their visit and any new prescriptions provided to the patient are listed below.  Medications given during this visit Medications  ondansetron (ZOFRAN) injection 4 mg (4 mg Intravenous Given 08/19/18 1213)  lidocaine (XYLOCAINE) 2 % viscous mouth solution 15 mL (15 mLs  Mouth/Throat Given 08/19/18 1215)     The patient appears reasonably screen and/or stabilized for discharge and I doubt any other medical condition or other [facility] requiring further screening, evaluation, or treatment in the ED at this time prior to discharge.    Final Clinical Impressions(s) / ED Diagnoses   Final diagnoses:  Epistaxis    ED Discharge Orders    None       Deno Etienne, DO 08/19/18 [place], [place], DO 08/30/18 1519

## 2018-08-19 NOTE — ED Notes (Signed)
Bed: WA20 Expected date:  Expected time:  Means of arrival:  Comments: EMS-nose bleed

## 2018-08-19 NOTE — ED Provider Notes (Signed)
[place] DEPT Provider Note   CSN: [id number] Arrival date & time: 08/19/18  8366    History   Chief Complaint Chief Complaint  Patient presents with  . Epistaxis    HPI [name] [name] is a 85 y.o. female.     85 yo F with a chief complaint of epistaxis.  Started last night and was seen in the ED and treated but had recurrence of her bleed.  Feels it is bleeding from bilateral naris.  Patient also spitting up blood as well.  Denies trauma to the area.  She had packing applied which is no longer and she says it was removed by the nurse.  The history is provided by the patient.  Epistaxis  Location:  Bilateral Severity:  Moderate Timing:  Constant Progression:  Worsening Chronicity:  New Relieved by:  Nothing Worsened by:  Nothing Ineffective treatments:  None tried Associated symptoms: no congestion, no dizziness, no fever and no headaches     Past Medical History:  Diagnosis Date  . Arthritis    r tkr  . Breast cancer ([place])   . Chronic atrial fibrillation    CHADS2-VASc=6.  on low dose Eliquis (corrected for age & renal fxn)  . CVA (cerebral vascular accident) ([place]) 2008   mini stroke.no residual  . DEPRESSION 05/16/2009  . Diabetes mellitus type II   . Heart murmur    stress test 2009.dr peter [place]  . HYPERLIPIDEMIA 07/24/2008  . HYPERTENSION 07/24/2008  . HYPOTHYROIDISM 07/24/2008  . Intermittent vertigo   . Metastatic carcinoid tumor to intra-abdominal site ([place])   . Pneumonia   . Pulmonary hypertension ([place])    Mod PHTN on Echo 03/2016: 2/2 combination of Wegener's, HFPF & Afib.  . Skin abnormality    facial lesions .pt applying mupiracin to areas  . Vertigo   . 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 Epistaxis 03/29/2018  . Acute renal failure superimposed on stage 4 chronic kidney disease ([place]) 03/20/2018  . Pneumonia 03/15/2018  . Community acquired  pneumonia 09/17/2017  . [facility] 09/16/2017  . Acute on chronic respiratory failure with hypoxia ([place]) 09/29/2016  . Malnutrition of moderate degree 07/01/2016  . Incarcerated hernia 06/30/2016  . Recurrent umbilical hernia with incarceration 06/30/2016  . Cellulitis and abscess   . Diabetic foot ulcer ([place]) 12/10/2015  . Hereditary and idiopathic peripheral neuropathy 09/25/2014  . Dependent edema 09/25/2014  . UTI (lower urinary tract infection)   . Type 2 diabetes mellitus with neurological complications ([place]) 29/47/6546  . Chronic atrial fibrillation (HCC) CHADS2-VASc=6.  On Corrected "low" dose of Eliquis 04/04/2014  . CKD (chronic kidney disease) stage 3, GFR 30-59 ml/min (HCC) 04/04/2014  . Abdominal hernia without obstruction or gangrene 01/09/2014  . Nausea alone 08/21/2013  . Diastolic CHF, chronic (HCC) - on low dose Lasix. ARB & BB along with Norvasc 08/18/2013  . Sepsis ([place]) 08/18/2013  . Hand pain, left 08/18/2013  . Chronic kidney disease (CKD), stage IV (severe) ([place]) 05/08/2013  . Normocytic anemia 05/08/2013  . Pulmonary hypertension ([place]) 05/07/2013  . Right-sided heart failure ([place]) 05/07/2013  . Pancreatic cyst 04/17/2013  . Acute respiratory failure with hypoxemia ([place]) 04/13/2013  . Obesity (BMI 30-39.9) 12/28/2012  . Wegener's granulomatosis (granulomatosis with polyangiitis) ([place]) 11/02/2012  . Cough with hemoptysis 11/01/2012  . Anemia 11/01/2012  . Dehydration 11/01/2012  . Diffuse pulmonary alveolar hemorrhage 11/01/2012  .  Breast cancer ([place]) 09/14/2012  . Pruritus of skin 05/20/2012  . Carcinoid tumor of colon, malignant ([place]) 05/16/2011  . Multifocal pneumonia 04/05/2011  . Vertigo, intermittent 10/21/2010  . Edema 03/25/2010  . Hyponatremia 02/13/2010  . DEPRESSION 05/16/2009  . Hypothyroidism 07/24/2008  . Hyperlipidemia 07/24/2008  . Essential hypertension 07/24/2008  . OSTEOARTHRITIS 07/24/2008    Past Surgical  History:  Procedure Laterality Date  . BREAST SURGERY  2007   Lumpectomy, XRT 2006.l breast  . CHOLECYSTECTOMY  1980  . EXPLORATORY LAPAROTOMY    . HEEL SPUR SURGERY Right   . JOINT REPLACEMENT     r knee  . KNEE SURGERY  2009   TKR  . TEE WITHOUT CARDIOVERSION N/A 09/16/2017   Procedure: TRANSESOPHAGEAL ECHOCARDIOGRAM (TEE);  Surgeon: Larey Dresser, MD;  Location: [facility] ENDOSCOPY;  Service: Cardiovascular;  Laterality: N/A;  . TRANSTHORACIC ECHOCARDIOGRAM  03/2016   Afib (no Diastolic Fxn). EF 55-60%. No RWMA. Mild Aortic dilation Mild MR. Severe LA dilation. Mod RA dilation.  Mod TR with Mod-Severe Pulmonary HTN - but normal RV function..  . VENTRAL HERNIA REPAIR N/A 07/01/2016   Procedure: REPAIR OF INCARCERATED INCISIONAL HERNIA ;  Surgeon: Stark Klein, MD;  Location: [facility] ORS;  Service: General;  Laterality: N/A;  . VIDEO ASSISTED THORACOSCOPY  04/22/2011   Procedure: VIDEO ASSISTED THORACOSCOPY;  Surgeon: Melrose Nakayama, MD;  Location: [facility];  Service: Thoracic;  Laterality: Left;  WITH BIOPSY     OB History   No obstetric history on file.      Home Medications    Prior to Admission medications   Medication Sig Start Date End Date Taking? Authorizing Provider  acetaminophen (TYLENOL) 500 MG tablet Take 1,000 mg by mouth every 6 (six) hours as needed for headache.     [provider]  amLODipine (NORVASC) 5 MG tablet Take 5 mg by mouth at bedtime.  09/25/17   [provider]  amoxicillin-clavulanate (AUGMENTIN) 875-125 MG tablet Take 1 tablet by mouth 2 (two) times daily. One po bid x 7 days 08/19/18   Deno Etienne, DO  apixaban (ELIQUIS) 2.5 MG TABS tablet Take 2.5 mg by mouth 2 (two) times daily.    [provider]  BYSTOLIC 10 MG tablet TAKE 1 TABLET BY MOUTH ONCE DAILY AFTER  BREAKFAST 02/22/18   Burchette, Alinda Sierras, MD  diazepam (VALIUM) 5 MG tablet TAKE 1 TABLET BY MOUTH EVERY 12 HOURS AS NEEDED FOR SEVERE VERTIGO FLARES. AVOID REGULAR USE.  11/02/17   Burchette, Alinda Sierras, MD  famotidine (PEPCID) 10 MG tablet Take 1 tablet (10 mg total) by mouth 2 (two) times daily. 03/21/18   Eugenie Filler, MD  feeding supplement, ENSURE ENLIVE, (ENSURE ENLIVE) LIQD Take 237 mLs by mouth 2 (two) times daily between meals. 09/18/17   Debbe Odea, MD  furosemide (LASIX) 20 MG tablet Take 1 tablet (20 mg total) by mouth daily as needed for fluid or edema. 06/25/18   Burchette, Alinda Sierras, MD  gabapentin (NEURONTIN) 100 MG capsule TAKE 1 CAPSULE BY MOUTH TWICE DAILY BEFORE MEAL(S) 06/01/18   Burchette, Alinda Sierras, MD  glimepiride (AMARYL) 2 MG tablet TAKE ONE TABLET BY MOUTH ONCE DAILY BEFORE BREAKFAST Patient taking differently: Take 2 mg by mouth daily as needed (high blood sugar).  10/07/16   Burchette, Alinda Sierras, MD  guaiFENesin (MUCINEX) 600 MG 12 hr tablet Take 1 tablet (600 mg total) by mouth 2 (two) times daily as needed for cough. Patient not taking: Reported  on 06/14/2018 09/17/17   Debbe Odea, MD  levothyroxine (SYNTHROID, LEVOTHROID) 100 MCG tablet TAKE 1 TABLET BY MOUTH ONCE DAILY BEFORE BREAKFAST. 07/27/17   Burchette, Alinda Sierras, MD  loratadine (CLARITIN) 10 MG tablet Take 10 mg by mouth daily.    [provider]  Multiple Vitamins-Minerals (CENTRUM SILVER 50+WOMEN) TABS Take 1 tablet by mouth daily.     [provider]  ondansetron (ZOFRAN-ODT) 4 MG disintegrating tablet Take 1 tablet (4 mg total) by mouth every 8 (eight) hours as needed for nausea or vomiting. 09/18/17   Burchette, Alinda Sierras, MD  oxymetazoline (AFRIN) 0.05 % nasal spray Place 1 spray into both nostrils 2 (two) times daily as needed for congestion.    [provider]  predniSONE (DELTASONE) 10 MG tablet Take three tablets once daily for 7 days. 08/17/18   Burchette, Alinda Sierras, MD  predniSONE (DELTASONE) 20 MG tablet Take 2 tablets (40 mg total) by mouth daily with breakfast. 03/22/18   Eugenie Filler, MD    Family History Family History  Problem Relation Age  of Onset  . Arthritis Mother   . Rheum arthritis Mother   . Heart disease Father   . Coronary artery disease Father   . Cancer Sister        breast CA, both sisters  . Breast cancer Sister   . Coronary artery disease Brother   . Lung cancer Brother     Social History Social History   Tobacco Use  . Smoking status: Former Smoker    Packs/day: 0.50    Years: 12.00    Pack years: 6.00    Types: Cigarettes    Last attempt to quit: 06/04/1982    Years since quitting: 36.2  . Smokeless tobacco: Never Used  Substance Use Topics  . Alcohol use: No  . Drug use: No     Allergies   Codeine sulfate; Other; Sulfonamide derivatives; Vancomycin; and Atarax [hydroxyzine]   Review of Systems Review of Systems  Constitutional: Negative for chills and fever.  HENT: Positive for nosebleeds. Negative for congestion and rhinorrhea.   Eyes: Negative for redness and visual disturbance.  Respiratory: Negative for shortness of breath and wheezing.   Cardiovascular: Negative for chest pain and palpitations.  Gastrointestinal: Negative for nausea and vomiting.  Genitourinary: Negative for dysuria and urgency.  Musculoskeletal: Negative for arthralgias and myalgias.  Skin: Negative for pallor and wound.  Neurological: Negative for dizziness and headaches.     Physical Exam Updated Vital Signs BP (!) 184/121 Comment: Patients nose bleeding  Pulse (!) 112 Comment: Patients nose bleeding  Temp (!) 97.4 F (36.3 C) Comment: blood in mouth  Resp (!) 22   Ht _0  (1.676 m)   Wt 65.3 kg   SpO2 96% Comment: Patients nose bleeding  BMI 23.24 kg/m   Physical Exam Vitals signs and nursing note reviewed.  Constitutional:      General: She is not in acute distress.    Appearance: She is well-developed. She is not diaphoretic.  HENT:     Head: Normocephalic and atraumatic.     Nose:     Comments: Bleeding noted from bilateral naris.  Unable to visualize site. Eyes:     Pupils: Pupils are  equal, round, and reactive to light.  Neck:     Musculoskeletal: Normal range of motion and neck supple.  Cardiovascular:     Rate and Rhythm: Normal rate and regular rhythm.     Heart sounds: No murmur.  No friction rub. No gallop.   Pulmonary:     Effort: Pulmonary effort is normal.     Breath sounds: No wheezing or rales.  Abdominal:     General: There is no distension.     Palpations: Abdomen is soft.     Tenderness: There is no abdominal tenderness.  Musculoskeletal:        General: No tenderness.  Skin:    General: Skin is warm and dry.  Neurological:     Mental Status: She is alert and oriented to person, place, and time.  Psychiatric:        Behavior: Behavior normal.      ED Treatments / Results  Labs (all labs ordered are listed, but only abnormal results are displayed) Labs Reviewed - No data to display  EKG None  Radiology No results found.  Procedures .Epistaxis Management Date/Time: 08/19/2018 7:55 AM Performed by: Deno Etienne, DO Authorized by: Deno Etienne, DO   Consent:    Consent obtained:  Verbal   Consent given by:  Patient   Risks discussed:  Bleeding, infection and nasal injury   Alternatives discussed:  No treatment, delayed treatment, alternative treatment, observation and referral Anesthesia (see MAR for exact dosages):    Anesthesia method:  Topical application   Topical anesthesia: Lidocaine with epinephrine. Procedure details:    Treatment site:  L anterior and R anterior   Treatment method:  Anterior pack and nasal balloon   Treatment complexity:  Limited   Treatment episode: recurring   Post-procedure details:    Assessment:  Bleeding stopped   Patient tolerance of procedure:  Tolerated well, no immediate complications   (including critical care time)  Medications Ordered in ED Medications  oxymetazoline (AFRIN) 0.05 % nasal spray 1 spray (1 spray Each Nare Given 08/19/18 0753)  lidocaine-EPINEPHrine (XYLOCAINE W/EPI) 2  %-1:200000 (PF) injection 20 mL (20 mLs Other Given 08/19/18 0753)     Initial Impression / Assessment and Plan / ED Course  I have reviewed the triage vital signs and the nursing notes.  Pertinent labs & imaging results that were available during my care of the patient were reviewed by me and considered in my medical decision making (see chart for details).        45 yoF who was just seen in the emergency department with nosebleed returns with the same.  Her packing is absent I am not sure if the patient to get out or if it was removed by the triage nurse.  I am unable to visualize the site of bleeding.  The bleeding did stop with bilateral 7.5 Rhino Rocket's.  We will have her call her ear nose and throat doctor today as she has had recurrent issues with nosebleed and so they can follow her up in the office.  With the length of the pack I will start her on prophylactic antibiotics.  7:57 AM:  I have discussed the diagnosis/risks/treatment options with the patient and believe the pt to be eligible for discharge home to follow-up with ENT. We also discussed returning to the ED immediately if new or worsening sx occur. We discussed the sx which are most concerning (e.g., sudden worsening pain, fever, inability to tolerate by mouth) that necessitate immediate return. Medications administered to the patient during their visit and any new prescriptions provided to the patient are listed below.  Medications given during this visit Medications  oxymetazoline (AFRIN) 0.05 % nasal spray 1 spray (1 spray Each Nare Given 08/19/18 0753)  lidocaine-EPINEPHrine (XYLOCAINE W/EPI) 2 %-1:200000 (PF) injection 20 mL (20 mLs Other Given 08/19/18 0753)     The patient appears reasonably screen and/or stabilized for discharge and I doubt any other medical condition or other [facility] requiring further screening, evaluation, or treatment in the ED at this time prior to discharge.    Final Clinical Impressions(s) / ED  Diagnoses   Final diagnoses:  Epistaxis    ED Discharge Orders         Ordered    amoxicillin-clavulanate (AUGMENTIN) 875-125 MG tablet  2 times daily     08/19/18 [place], [place], DO 08/19/18 [phone]

## 2018-08-22 NOTE — ED Notes (Addendum)
Patient came back to ED for IV removal stating "they forgot to take it out". IV removed by this RN at left forearm. IV site clean, dry, and intact. Small pin point brusing noted at IV site. No swelling or infection noted. Patient denies pain at IV site. Catheter intact after removal.

## 2018-08-24 DIAGNOSIS — R040 Epistaxis: Secondary | ICD-10-CM | POA: Diagnosis not present

## 2018-08-24 DIAGNOSIS — Z7901 Long term (current) use of anticoagulants: Secondary | ICD-10-CM | POA: Diagnosis not present

## 2018-09-11 ENCOUNTER — Other Ambulatory Visit: Payer: Self-pay | Admitting: Family Medicine

## 2018-09-22 ENCOUNTER — Other Ambulatory Visit: Payer: Self-pay | Admitting: Family Medicine

## 2018-09-24 ENCOUNTER — Ambulatory Visit (INDEPENDENT_AMBULATORY_CARE_PROVIDER_SITE_OTHER): Payer: Medicare Other | Admitting: Internal Medicine

## 2018-09-24 ENCOUNTER — Other Ambulatory Visit: Payer: Self-pay

## 2018-09-24 DIAGNOSIS — J069 Acute upper respiratory infection, unspecified: Secondary | ICD-10-CM

## 2018-09-24 MED ORDER — FLUTICASONE PROPIONATE 50 MCG/ACT NA SUSP: 2.0000 | Freq: Every day | NASAL | 6 refills | Status: AC

## 2018-09-24 NOTE — Progress Notes (Signed)
Virtual Visit via Telephone Note  I connected with [name] [name] on 09/24/18 at  3:30 PM EDT by telephone and verified that I am speaking with the correct person using two identifiers.   I discussed the limitations, risks, security and privacy concerns of performing an evaluation and management service by telephone and the availability of in person appointments. I also discussed with the patient that there may be a patient responsible charge related to this service. The patient expressed understanding and agreed to proceed.  We initially attempted to connect via video chat but were unable to due to technical difficulties on the patient's end, so we converted this visit to a phone visit.  Location patient: home Location provider: work office Participants present for the call: patient, provider Patient did not have a visit in the prior 7 days to address this/these issue(s).   History of Present Illness:  She has scheduled this visit due to a 2-3 days h/o congested nasal passages and a sinus HA. She denies cough, fever, SOB, myalgias.   Observations/Objective: Patient sounds cheerful and well on the phone. I do not appreciate any increased work of breathing. Speech and thought processing are grossly intact. Patient reported vitals: none reported   Current Outpatient Medications:  .  acetaminophen (TYLENOL) 500 MG tablet, Take 1,000 mg by mouth every 6 (six) hours as needed for headache. , Disp: , Rfl:  .  amLODipine (NORVASC) 5 MG tablet, Take 1 tablet by mouth once daily, Disp: 90 tablet, Rfl: 0 .  amoxicillin-clavulanate (AUGMENTIN) 875-125 MG tablet, Take 1 tablet by mouth 2 (two) times daily. One po bid x 7 days, Disp: 14 tablet, Rfl: 0 .  apixaban (ELIQUIS) 2.5 MG TABS tablet, Take 2.5 mg by mouth 2 (two) times daily., Disp: , Rfl:  .  BYSTOLIC 10 MG tablet, TAKE 1 TABLET BY MOUTH ONCE DAILY AFTER  BREAKFAST (Patient taking differently: Take 10 mg by mouth daily. ), Disp: 90  tablet, Rfl: 1 .  diazepam (VALIUM) 5 MG tablet, TAKE 1 TABLET BY MOUTH EVERY 12 HOURS AS NEEDED FOR SEVERE VERTIGO FLARES. AVOID REGULAR USE. (Patient taking differently: Take 5 mg by mouth every 12 (twelve) hours as needed (vertigo flares). ), Disp: 20 tablet, Rfl: 0 .  famotidine (PEPCID) 10 MG tablet, Take 1 tablet (10 mg total) by mouth 2 (two) times daily., Disp: 60 tablet, Rfl: 0 .  feeding supplement, ENSURE ENLIVE, (ENSURE ENLIVE) LIQD, Take 237 mLs by mouth 2 (two) times daily between meals., Disp: 237 mL, Rfl: 12 .  fluticasone (FLONASE) 50 MCG/ACT nasal spray, Place 2 sprays into both nostrils daily., Disp: 16 g, Rfl: 6 .  furosemide (LASIX) 20 MG tablet, Take 1 tablet (20 mg total) by mouth daily as needed for fluid or edema., Disp: 90 tablet, Rfl: 1 .  gabapentin (NEURONTIN) 100 MG capsule, TAKE 1 CAPSULE BY MOUTH TWICE DAILY BEFORE MEAL(S) (Patient taking differently: Take 100 mg by mouth 2 (two) times daily. ), Disp: 60 capsule, Rfl: 2 .  glimepiride (AMARYL) 2 MG tablet, TAKE ONE TABLET BY MOUTH ONCE DAILY BEFORE BREAKFAST (Patient taking differently: Take 2 mg by mouth daily as needed (high blood sugar). ), Disp: 90 tablet, Rfl: 1 .  guaiFENesin (MUCINEX) 600 MG 12 hr tablet, Take 1 tablet (600 mg total) by mouth 2 (two) times daily as needed for cough. (Patient not taking: Reported on 06/14/2018), Disp: 30 tablet, Rfl: 0 .  levothyroxine (SYNTHROID, LEVOTHROID) 100 MCG tablet, TAKE 1  TABLET BY MOUTH ONCE DAILY BEFORE BREAKFAST. (Patient taking differently: Take 100 mcg by mouth daily before breakfast. ), Disp: 90 tablet, Rfl: 2 .  loratadine (CLARITIN) 10 MG tablet, Take 10 mg by mouth daily., Disp: , Rfl:  .  Multiple Vitamins-Minerals (CENTRUM SILVER 50+WOMEN) TABS, Take 1 tablet by mouth daily. , Disp: , Rfl:  .  ondansetron (ZOFRAN-ODT) 4 MG disintegrating tablet, Take 1 tablet (4 mg total) by mouth every 8 (eight) hours as needed for nausea or vomiting., Disp: 15 tablet, Rfl: 0 .   oxymetazoline (AFRIN) 0.05 % nasal spray, Place 1 spray into both nostrils 2 (two) times daily as needed for congestion., Disp: , Rfl:  .  predniSONE (DELTASONE) 10 MG tablet, Take three tablets once daily for 7 days. (Patient taking differently: 30 mg daily with breakfast. Take three tablets once daily for 7 days.), Disp: 21 tablet, Rfl: 0 .  predniSONE (DELTASONE) 20 MG tablet, Take 2 tablets (40 mg total) by mouth daily with breakfast., Disp: 60 tablet, Rfl: 0  Review of Systems:  Constitutional: Denies fever, chills, diaphoresis, appetite change and fatigue.  HEENT: Denies photophobia, eye pain, redness, hearing loss, ear pain, sneezing, mouth sores, trouble swallowing, neck pain, neck stiffness and tinnitus.   Respiratory: Denies SOB, DOE, cough, chest tightness,  and wheezing.   Cardiovascular: Denies chest pain, palpitations and leg swelling.  Gastrointestinal: Denies nausea, vomiting, abdominal pain, diarrhea, constipation, blood in stool and abdominal distention.  Genitourinary: Denies dysuria, urgency, frequency, hematuria, flank pain and difficulty urinating.  Endocrine: Denies: hot or cold intolerance, sweats, changes in hair or nails, polyuria, polydipsia. Musculoskeletal: Denies myalgias, back pain, joint swelling, arthralgias and gait problem.  Skin: Denies pallor, rash and wound.  Neurological: Denies dizziness, seizures, syncope, weakness, light-headedness, numbness and headaches.  Hematological: Denies adenopathy. Easy bruising, personal or family bleeding history  Psychiatric/Behavioral: Denies suicidal ideation, mood changes, confusion, nervousness, sleep disturbance and agitation   Assessment and Plan:  Upper respiratory tract infection, unspecified type  -Mild URI vs Sinus HA -Claritin, mucinex and flonase recommended. -RTC if no improvement in 10-14 days.   I discussed the assessment and treatment plan with the patient. The patient was provided an opportunity to ask  questions and all were answered. The patient agreed with the plan and demonstrated an understanding of the instructions.   The patient was advised to call back or seek an in-person evaluation if the symptoms worsen or if the condition fails to improve as anticipated.  I provided 13 minutes of non-face-to-face time during this encounter.   Lelon Frohlich, MD [facility] at [facility]

## 2018-09-28 ENCOUNTER — Ambulatory Visit: Payer: Self-pay

## 2018-09-28 NOTE — Telephone Encounter (Signed)
Last OV 09/24/18, No future OV  Last filled 11/02/17, #20 with 0 refills

## 2018-09-28 NOTE — Telephone Encounter (Signed)
ret'd call to pt.  Reported intermittent nausea for past 3 days.  Denied abdominal pain, vomiting, diarrhea, constipation, fever/ chills.  Reported sinus congestion is better, since she is using a nasal spray.  Reported an infrequent cough.  Denied shortness of breath, or body aches.  C/o post nasal drip, and is unsure if that has caused the intermittent nausea; stated "I think it might".  Reported she had a medication to use, as needed, for nausea, but has run out of this. (Zofran)  Stated her appetite is decreased.  Stated she thinks she gets good fluid intake.  Advised will send Triage note to Dr. Elease Hashimoto for further recommendations.  Care advice given per protocol.  Verb. Understanding.   Reason for Disposition . Unexplained nausea  Answer Assessment - Initial Assessment Questions 1. NAUSEA SEVERITY: "How bad is the nausea?" (e.g., mild, moderate, severe; dehydration, weight loss)   - MILD: loss of appetite without change in eating habits   - MODERATE: decreased oral intake without significant weight loss, dehydration, or malnutrition   - SEVERE: inadequate caloric or fluid intake, significant weight loss, symptoms of dehydration     Nausea comes in waves; "mild" ;"I just get nauseated once in awhile"  2. ONSET: "When did the nausea begin?"     About 3 days ago 3. VOMITING: "Any vomiting?" If so, ask: "How many times today?"     Denied  4. RECURRENT SYMPTOM: "Have you had nausea before?" If so, ask: "When was the last time?" "What happened that time?"     Yes; was prescribed Zofran 5. CAUSE: "What do you think is causing the nausea?"     unknown 6. PREGNANCY: "Is there any chance you are pregnant?" (e.g., unprotected intercourse, missed birth control pill, broken condom)     N/a  Protocols used: NAUSEA-A-AH  Summary: Nausea medicine   Pt was told by Dr. Elease Hashimoto to take Muncinex for nausea. Medicine has not worked. She would like to be given some of other medication to stop the  nausea.

## 2018-09-28 NOTE — Telephone Encounter (Signed)
Called patient and she stated that it is mostly the nausea that is bothering her. Patient stated that she is not hungry but her son made her some peanut butter crackers, peaches in syrup, and is drinking water and she is feeling better and going to bed.   I advised for patient to take her blood sugar and take her blood pressure and she stated that she will do that in the morning.  Please see messages.

## 2018-09-28 NOTE — Telephone Encounter (Signed)
Set up Doxy if not resolved by tomorrow.

## 2018-09-29 ENCOUNTER — Inpatient Hospital Stay (HOSPITAL_COMMUNITY): Payer: Medicare Other

## 2018-09-29 ENCOUNTER — Inpatient Hospital Stay (HOSPITAL_COMMUNITY)
Admission: EM | Admit: 2018-09-29 | Discharge: 2018-11-06 | DRG: 291 | Disposition: E | Payer: Medicare Other | Attending: Pulmonary Disease | Admitting: Pulmonary Disease

## 2018-09-29 ENCOUNTER — Emergency Department (HOSPITAL_COMMUNITY): Payer: Medicare Other

## 2018-09-29 ENCOUNTER — Other Ambulatory Visit: Payer: Self-pay

## 2018-09-29 ENCOUNTER — Encounter (HOSPITAL_COMMUNITY): Payer: Self-pay | Admitting: Emergency Medicine

## 2018-09-29 DIAGNOSIS — Z882 Allergy status to sulfonamides status: Secondary | ICD-10-CM

## 2018-09-29 DIAGNOSIS — J9690 Respiratory failure, unspecified, unspecified whether with hypoxia or hypercapnia: Secondary | ICD-10-CM | POA: Diagnosis not present

## 2018-09-29 DIAGNOSIS — J9382 Other air leak: Secondary | ICD-10-CM | POA: Diagnosis not present

## 2018-09-29 DIAGNOSIS — I361 Nonrheumatic tricuspid (valve) insufficiency: Secondary | ICD-10-CM

## 2018-09-29 DIAGNOSIS — J9601 Acute respiratory failure with hypoxia: Secondary | ICD-10-CM

## 2018-09-29 DIAGNOSIS — Z885 Allergy status to narcotic agent status: Secondary | ICD-10-CM

## 2018-09-29 DIAGNOSIS — Z9981 Dependence on supplemental oxygen: Secondary | ICD-10-CM

## 2018-09-29 DIAGNOSIS — E039 Hypothyroidism, unspecified: Secondary | ICD-10-CM | POA: Diagnosis present

## 2018-09-29 DIAGNOSIS — Z923 Personal history of irradiation: Secondary | ICD-10-CM

## 2018-09-29 DIAGNOSIS — I5032 Chronic diastolic (congestive) heart failure: Secondary | ICD-10-CM | POA: Diagnosis present

## 2018-09-29 DIAGNOSIS — T380X5A Adverse effect of glucocorticoids and synthetic analogues, initial encounter: Secondary | ICD-10-CM | POA: Diagnosis present

## 2018-09-29 DIAGNOSIS — D631 Anemia in chronic kidney disease: Secondary | ICD-10-CM | POA: Diagnosis present

## 2018-09-29 DIAGNOSIS — Q248 Other specified congenital malformations of heart: Secondary | ICD-10-CM

## 2018-09-29 DIAGNOSIS — M3131 Wegener's granulomatosis with renal involvement: Secondary | ICD-10-CM | POA: Diagnosis not present

## 2018-09-29 DIAGNOSIS — J9622 Acute and chronic respiratory failure with hypercapnia: Secondary | ICD-10-CM | POA: Diagnosis present

## 2018-09-29 DIAGNOSIS — R739 Hyperglycemia, unspecified: Secondary | ICD-10-CM | POA: Diagnosis present

## 2018-09-29 DIAGNOSIS — I482 Chronic atrial fibrillation, unspecified: Secondary | ICD-10-CM | POA: Diagnosis not present

## 2018-09-29 DIAGNOSIS — Z87891 Personal history of nicotine dependence: Secondary | ICD-10-CM

## 2018-09-29 DIAGNOSIS — Z8249 Family history of ischemic heart disease and other diseases of the circulatory system: Secondary | ICD-10-CM

## 2018-09-29 DIAGNOSIS — R042 Hemoptysis: Secondary | ICD-10-CM | POA: Diagnosis not present

## 2018-09-29 DIAGNOSIS — Z79899 Other long term (current) drug therapy: Secondary | ICD-10-CM

## 2018-09-29 DIAGNOSIS — Z96651 Presence of right artificial knee joint: Secondary | ICD-10-CM | POA: Diagnosis present

## 2018-09-29 DIAGNOSIS — E038 Other specified hypothyroidism: Secondary | ICD-10-CM | POA: Diagnosis not present

## 2018-09-29 DIAGNOSIS — Z79890 Hormone replacement therapy (postmenopausal): Secondary | ICD-10-CM

## 2018-09-29 DIAGNOSIS — E8770 Fluid overload, unspecified: Secondary | ICD-10-CM | POA: Diagnosis not present

## 2018-09-29 DIAGNOSIS — J984 Other disorders of lung: Secondary | ICD-10-CM | POA: Diagnosis not present

## 2018-09-29 DIAGNOSIS — Z881 Allergy status to other antibiotic agents status: Secondary | ICD-10-CM

## 2018-09-29 DIAGNOSIS — J939 Pneumothorax, unspecified: Secondary | ICD-10-CM

## 2018-09-29 DIAGNOSIS — E1122 Type 2 diabetes mellitus with diabetic chronic kidney disease: Secondary | ICD-10-CM | POA: Diagnosis present

## 2018-09-29 DIAGNOSIS — N184 Chronic kidney disease, stage 4 (severe): Secondary | ICD-10-CM

## 2018-09-29 DIAGNOSIS — Y838 Other surgical procedures as the cause of abnormal reaction of the patient, or of later complication, without mention of misadventure at the time of the procedure: Secondary | ICD-10-CM | POA: Diagnosis not present

## 2018-09-29 DIAGNOSIS — D62 Acute posthemorrhagic anemia: Secondary | ICD-10-CM | POA: Diagnosis present

## 2018-09-29 DIAGNOSIS — T884XXA Failed or difficult intubation, initial encounter: Secondary | ICD-10-CM

## 2018-09-29 DIAGNOSIS — Z20828 Contact with and (suspected) exposure to other viral communicable diseases: Secondary | ICD-10-CM | POA: Diagnosis present

## 2018-09-29 DIAGNOSIS — Z7901 Long term (current) use of anticoagulants: Secondary | ICD-10-CM

## 2018-09-29 DIAGNOSIS — I5033 Acute on chronic diastolic (congestive) heart failure: Secondary | ICD-10-CM | POA: Diagnosis present

## 2018-09-29 DIAGNOSIS — Z66 Do not resuscitate: Secondary | ICD-10-CM | POA: Diagnosis not present

## 2018-09-29 DIAGNOSIS — Z4659 Encounter for fitting and adjustment of other gastrointestinal appliance and device: Secondary | ICD-10-CM | POA: Diagnosis not present

## 2018-09-29 DIAGNOSIS — I2723 Pulmonary hypertension due to lung diseases and hypoxia: Secondary | ICD-10-CM | POA: Diagnosis present

## 2018-09-29 DIAGNOSIS — J690 Pneumonitis due to inhalation of food and vomit: Secondary | ICD-10-CM | POA: Diagnosis not present

## 2018-09-29 DIAGNOSIS — I130 Hypertensive heart and chronic kidney disease with heart failure and stage 1 through stage 4 chronic kidney disease, or unspecified chronic kidney disease: Secondary | ICD-10-CM | POA: Diagnosis present

## 2018-09-29 DIAGNOSIS — F329 Major depressive disorder, single episode, unspecified: Secondary | ICD-10-CM | POA: Diagnosis present

## 2018-09-29 DIAGNOSIS — E871 Hypo-osmolality and hyponatremia: Secondary | ICD-10-CM | POA: Diagnosis present

## 2018-09-29 DIAGNOSIS — J9311 Primary spontaneous pneumothorax: Secondary | ICD-10-CM | POA: Diagnosis not present

## 2018-09-29 DIAGNOSIS — W1839XA Other fall on same level, initial encounter: Secondary | ICD-10-CM | POA: Diagnosis present

## 2018-09-29 DIAGNOSIS — I9581 Postprocedural hypotension: Secondary | ICD-10-CM | POA: Diagnosis not present

## 2018-09-29 DIAGNOSIS — N179 Acute kidney failure, unspecified: Secondary | ICD-10-CM | POA: Diagnosis not present

## 2018-09-29 DIAGNOSIS — I248 Other forms of acute ischemic heart disease: Secondary | ICD-10-CM | POA: Diagnosis present

## 2018-09-29 DIAGNOSIS — E1165 Type 2 diabetes mellitus with hyperglycemia: Secondary | ICD-10-CM | POA: Diagnosis not present

## 2018-09-29 DIAGNOSIS — J80 Acute respiratory distress syndrome: Secondary | ICD-10-CM | POA: Diagnosis not present

## 2018-09-29 DIAGNOSIS — R918 Other nonspecific abnormal finding of lung field: Secondary | ICD-10-CM | POA: Diagnosis not present

## 2018-09-29 DIAGNOSIS — R0602 Shortness of breath: Secondary | ICD-10-CM | POA: Diagnosis not present

## 2018-09-29 DIAGNOSIS — J9600 Acute respiratory failure, unspecified whether with hypoxia or hypercapnia: Secondary | ICD-10-CM

## 2018-09-29 DIAGNOSIS — R7989 Other specified abnormal findings of blood chemistry: Secondary | ICD-10-CM | POA: Diagnosis present

## 2018-09-29 DIAGNOSIS — M3130 Wegener's granulomatosis without renal involvement: Secondary | ICD-10-CM | POA: Diagnosis present

## 2018-09-29 DIAGNOSIS — D649 Anemia, unspecified: Secondary | ICD-10-CM | POA: Diagnosis present

## 2018-09-29 DIAGNOSIS — J982 Interstitial emphysema: Secondary | ICD-10-CM | POA: Diagnosis not present

## 2018-09-29 DIAGNOSIS — R0600 Dyspnea, unspecified: Secondary | ICD-10-CM

## 2018-09-29 DIAGNOSIS — M7989 Other specified soft tissue disorders: Secondary | ICD-10-CM

## 2018-09-29 DIAGNOSIS — R0902 Hypoxemia: Secondary | ICD-10-CM | POA: Diagnosis not present

## 2018-09-29 DIAGNOSIS — J9621 Acute and chronic respiratory failure with hypoxia: Secondary | ICD-10-CM | POA: Diagnosis present

## 2018-09-29 DIAGNOSIS — Z7951 Long term (current) use of inhaled steroids: Secondary | ICD-10-CM

## 2018-09-29 DIAGNOSIS — Z888 Allergy status to other drugs, medicaments and biological substances status: Secondary | ICD-10-CM

## 2018-09-29 DIAGNOSIS — E785 Hyperlipidemia, unspecified: Secondary | ICD-10-CM | POA: Diagnosis present

## 2018-09-29 DIAGNOSIS — Z515 Encounter for palliative care: Secondary | ICD-10-CM | POA: Diagnosis not present

## 2018-09-29 DIAGNOSIS — Z4682 Encounter for fitting and adjustment of non-vascular catheter: Secondary | ICD-10-CM | POA: Diagnosis not present

## 2018-09-29 DIAGNOSIS — I4821 Permanent atrial fibrillation: Secondary | ICD-10-CM | POA: Diagnosis present

## 2018-09-29 DIAGNOSIS — R0689 Other abnormalities of breathing: Secondary | ICD-10-CM | POA: Diagnosis not present

## 2018-09-29 DIAGNOSIS — I272 Pulmonary hypertension, unspecified: Secondary | ICD-10-CM | POA: Diagnosis not present

## 2018-09-29 DIAGNOSIS — I340 Nonrheumatic mitral (valve) insufficiency: Secondary | ICD-10-CM | POA: Diagnosis not present

## 2018-09-29 DIAGNOSIS — R069 Unspecified abnormalities of breathing: Secondary | ICD-10-CM | POA: Diagnosis not present

## 2018-09-29 DIAGNOSIS — G609 Hereditary and idiopathic neuropathy, unspecified: Secondary | ICD-10-CM | POA: Diagnosis present

## 2018-09-29 DIAGNOSIS — Z8261 Family history of arthritis: Secondary | ICD-10-CM

## 2018-09-29 DIAGNOSIS — Z853 Personal history of malignant neoplasm of breast: Secondary | ICD-10-CM

## 2018-09-29 DIAGNOSIS — Z9049 Acquired absence of other specified parts of digestive tract: Secondary | ICD-10-CM

## 2018-09-29 DIAGNOSIS — N183 Chronic kidney disease, stage 3 unspecified: Secondary | ICD-10-CM | POA: Diagnosis present

## 2018-09-29 DIAGNOSIS — Z7952 Long term (current) use of systemic steroids: Secondary | ICD-10-CM

## 2018-09-29 DIAGNOSIS — Z85038 Personal history of other malignant neoplasm of large intestine: Secondary | ICD-10-CM

## 2018-09-29 DIAGNOSIS — Z8673 Personal history of transient ischemic attack (TIA), and cerebral infarction without residual deficits: Secondary | ICD-10-CM

## 2018-09-29 DIAGNOSIS — J189 Pneumonia, unspecified organism: Secondary | ICD-10-CM | POA: Diagnosis not present

## 2018-09-29 LAB — CBC
HCT: 19.3 % — ABNORMAL LOW (ref 36.0–46.0)
HCT: 23.7 % — ABNORMAL LOW (ref 36.0–46.0)
Hemoglobin: 6.3 g/dL — CL (ref 12.0–15.0)
Hemoglobin: 7.6 g/dL — ABNORMAL LOW (ref 12.0–15.0)
MCH: 32 pg (ref 26.0–34.0)
MCH: 31.7 pg (ref 26.0–34.0)
MCHC: 32.6 g/dL (ref 30.0–36.0)
MCHC: 32.1 g/dL (ref 30.0–36.0)
MCV: 98 fL (ref 80.0–100.0)
MCV: 98.8 fL (ref 80.0–100.0)
Platelets: 351 10*3/uL (ref 150–400)
Platelets: 335 10*3/uL (ref 150–400)
RBC: 1.97 MIL/uL — ABNORMAL LOW (ref 3.87–5.11)
RBC: 2.4 MIL/uL — ABNORMAL LOW (ref 3.87–5.11)
RDW: 16.4 % — ABNORMAL HIGH (ref 11.5–15.5)
RDW: 17.5 % — ABNORMAL HIGH (ref 11.5–15.5)
WBC: 11.4 10*3/uL — ABNORMAL HIGH (ref 4.0–10.5)
WBC: 8.5 10*3/uL (ref 4.0–10.5)
nRBC: 1 % — ABNORMAL HIGH (ref 0.0–0.2)
nRBC: 0.9 % — ABNORMAL HIGH (ref 0.0–0.2)

## 2018-09-29 LAB — CBC WITH DIFFERENTIAL/PLATELET
Abs Immature Granulocytes: 0.29 10*3/uL — ABNORMAL HIGH (ref 0.00–0.07)
Basophils Absolute: 0 10*3/uL (ref 0.0–0.1)
Basophils Relative: 0 %
Eosinophils Absolute: 0 10*3/uL (ref 0.0–0.5)
Eosinophils Relative: 0 %
HCT: 21.9 % — ABNORMAL LOW (ref 36.0–46.0)
Hemoglobin: 6.6 g/dL — CL (ref 12.0–15.0)
Immature Granulocytes: 3 %
Lymphocytes Relative: 6 %
Lymphs Abs: 0.6 10*3/uL — ABNORMAL LOW (ref 0.7–4.0)
MCH: 31.3 pg (ref 26.0–34.0)
MCHC: 30.1 g/dL (ref 30.0–36.0)
MCV: 103.8 fL — ABNORMAL HIGH (ref 80.0–100.0)
Monocytes Absolute: 0.9 10*3/uL (ref 0.1–1.0)
Monocytes Relative: 9 %
Neutro Abs: 8.3 10*3/uL — ABNORMAL HIGH (ref 1.7–7.7)
Neutrophils Relative %: 82 %
Platelets: 399 10*3/uL (ref 150–400)
RBC: 2.11 MIL/uL — ABNORMAL LOW (ref 3.87–5.11)
RDW: 16.7 % — ABNORMAL HIGH (ref 11.5–15.5)
WBC: 10.2 10*3/uL (ref 4.0–10.5)
nRBC: 1 % — ABNORMAL HIGH (ref 0.0–0.2)

## 2018-09-29 LAB — POCT I-STAT 7, (LYTES, BLD GAS, ICA,H+H)
Acid-base deficit: 2 mmol/L (ref 0.0–2.0)
Acid-base deficit: 2 mmol/L (ref 0.0–2.0)
Bicarbonate: 24.8 mmol/L (ref 20.0–28.0)
Bicarbonate: 25.4 mmol/L (ref 20.0–28.0)
Calcium, Ion: 1.2 mmol/L (ref 1.15–1.40)
Calcium, Ion: 1.19 mmol/L (ref 1.15–1.40)
HCT: 27 % — ABNORMAL LOW (ref 36.0–46.0)
HCT: 28 % — ABNORMAL LOW (ref 36.0–46.0)
Hemoglobin: 9.2 g/dL — ABNORMAL LOW (ref 12.0–15.0)
Hemoglobin: 9.5 g/dL — ABNORMAL LOW (ref 12.0–15.0)
O2 Saturation: 97 %
O2 Saturation: 96 %
Patient temperature: 97.2
Patient temperature: 96.8
Potassium: 4.4 mmol/L (ref 3.5–5.1)
Potassium: 4.2 mmol/L (ref 3.5–5.1)
Sodium: 133 mmol/L — ABNORMAL LOW (ref 135–145)
Sodium: 135 mmol/L (ref 135–145)
TCO2: 26 mmol/L (ref 22–32)
TCO2: 27 mmol/L (ref 22–32)
pCO2 arterial: 51.1 mmHg — ABNORMAL HIGH (ref 32.0–48.0)
pCO2 arterial: 52 mmHg — ABNORMAL HIGH (ref 32.0–48.0)
pH, Arterial: 7.29 — ABNORMAL LOW (ref 7.350–7.450)
pH, Arterial: 7.292 — ABNORMAL LOW (ref 7.350–7.450)
pO2, Arterial: 100 mmHg (ref 83.0–108.0)
pO2, Arterial: 85 mmHg (ref 83.0–108.0)

## 2018-09-29 LAB — URINALYSIS, ROUTINE W REFLEX MICROSCOPIC
Bilirubin Urine: NEGATIVE
Glucose, UA: NEGATIVE mg/dL
Ketones, ur: NEGATIVE mg/dL
Leukocytes,Ua: NEGATIVE
Nitrite: NEGATIVE
Protein, ur: 100 mg/dL — AB
Specific Gravity, Urine: 1.013 (ref 1.005–1.030)
pH: 5 (ref 5.0–8.0)

## 2018-09-29 LAB — PHOSPHORUS: Phosphorus: 4.5 mg/dL (ref 2.5–4.6)

## 2018-09-29 LAB — COMPREHENSIVE METABOLIC PANEL
ALT: 8 U/L (ref 0–44)
ALT: 8 U/L (ref 0–44)
AST: 17 U/L (ref 15–41)
AST: 17 U/L (ref 15–41)
Albumin: 2.9 g/dL — ABNORMAL LOW (ref 3.5–5.0)
Albumin: 2.9 g/dL — ABNORMAL LOW (ref 3.5–5.0)
Alkaline Phosphatase: 71 U/L (ref 38–126)
Alkaline Phosphatase: 61 U/L (ref 38–126)
Anion gap: 12 (ref 5–15)
Anion gap: 11 (ref 5–15)
BUN: 42 mg/dL — ABNORMAL HIGH (ref 8–23)
BUN: 42 mg/dL — ABNORMAL HIGH (ref 8–23)
CO2: 20 mmol/L — ABNORMAL LOW (ref 22–32)
CO2: 22 mmol/L (ref 22–32)
Calcium: 8.3 mg/dL — ABNORMAL LOW (ref 8.9–10.3)
Calcium: 8.2 mg/dL — ABNORMAL LOW (ref 8.9–10.3)
Chloride: 100 mmol/L (ref 98–111)
Chloride: 101 mmol/L (ref 98–111)
Creatinine, Ser: 3.01 mg/dL — ABNORMAL HIGH (ref 0.44–1.00)
Creatinine, Ser: 3.01 mg/dL — ABNORMAL HIGH (ref 0.44–1.00)
GFR calc Af Amer: 16 mL/min — ABNORMAL LOW (ref >60)
GFR calc Af Amer: 16 mL/min — ABNORMAL LOW (ref >60)
GFR calc non Af Amer: 14 mL/min — ABNORMAL LOW (ref >60)
GFR calc non Af Amer: 14 mL/min — ABNORMAL LOW (ref >60)
Glucose, Bld: 204 mg/dL — ABNORMAL HIGH (ref 70–99)
Glucose, Bld: 152 mg/dL — ABNORMAL HIGH (ref 70–99)
Potassium: 4.4 mmol/L (ref 3.5–5.1)
Potassium: 4.3 mmol/L (ref 3.5–5.1)
Sodium: 132 mmol/L — ABNORMAL LOW (ref 135–145)
Sodium: 134 mmol/L — ABNORMAL LOW (ref 135–145)
Total Bilirubin: 1.2 mg/dL (ref 0.3–1.2)
Total Bilirubin: 1.3 mg/dL — ABNORMAL HIGH (ref 0.3–1.2)
Total Protein: 6.9 g/dL (ref 6.5–8.1)
Total Protein: 6.9 g/dL (ref 6.5–8.1)

## 2018-09-29 LAB — TROPONIN I (HIGH SENSITIVITY)
Troponin I (High Sensitivity): 16 ng/L (ref <18)
Troponin I (High Sensitivity): 21 ng/L — ABNORMAL HIGH (ref <18)
Troponin I (High Sensitivity): 423 ng/L (ref <18)
Troponin I (High Sensitivity): 388 ng/L (ref <18)

## 2018-09-29 LAB — SARS CORONAVIRUS 2 BY RT PCR (HOSPITAL ORDER, PERFORMED IN ~~LOC~~ HOSPITAL LAB): SARS Coronavirus 2: NEGATIVE

## 2018-09-29 LAB — PROCALCITONIN
Procalcitonin: UNDETERMINED ng/mL
Procalcitonin: 1.4 ng/mL

## 2018-09-29 LAB — BRAIN NATRIURETIC PEPTIDE: B Natriuretic Peptide: 2059.3 pg/mL — ABNORMAL HIGH (ref 0.0–100.0)

## 2018-09-29 LAB — PREPARE RBC (CROSSMATCH)

## 2018-09-29 LAB — ECHOCARDIOGRAM COMPLETE

## 2018-09-29 LAB — GLUCOSE, CAPILLARY: Glucose-Capillary: 138 mg/dL — ABNORMAL HIGH (ref 70–99)

## 2018-09-29 LAB — MRSA PCR SCREENING: MRSA by PCR: NEGATIVE

## 2018-09-29 LAB — MAGNESIUM
Magnesium: UNDETERMINED mg/dL (ref 1.7–2.4)
Magnesium: 2.5 mg/dL — ABNORMAL HIGH (ref 1.7–2.4)

## 2018-09-29 LAB — LACTIC ACID, PLASMA
Lactic Acid, Venous: 1 mmol/L (ref 0.5–1.9)
Lactic Acid, Venous: 1.2 mmol/L (ref 0.5–1.9)

## 2018-09-29 LAB — STREP PNEUMONIAE URINARY ANTIGEN: Strep Pneumo Urinary Antigen: NEGATIVE

## 2018-09-29 LAB — CORTISOL: Cortisol, Plasma: 27.4 ug/dL

## 2018-09-29 MED ORDER — CHLORHEXIDINE GLUCONATE CLOTH 2 % EX PADS
6.0000 | MEDICATED_PAD | Freq: Every day | CUTANEOUS | Status: DC
  Administered 2018-09-29 – 2018-10-05 (×5): 6 via TOPICAL

## 2018-09-29 MED ORDER — LEVOTHYROXINE SODIUM 100 MCG PO TABS
100.0000 ug | ORAL_TABLET | Freq: Every day | ORAL | Status: DC
  Administered 2018-09-29 – 2018-10-04 (×5): 100 ug via ORAL
  Filled 2018-09-29 (×6): qty 1

## 2018-09-29 MED ORDER — ACETAMINOPHEN 500 MG PO TABS: 1000.0000 mg | ORAL_TABLET | Freq: Four times a day (QID) | ORAL | Status: DC | PRN

## 2018-09-29 MED ORDER — ONDANSETRON HCL 4 MG/2ML IJ SOLN
4.0000 mg | Freq: Four times a day (QID) | INTRAMUSCULAR | Status: DC | PRN
  Filled 2018-09-29: qty 2

## 2018-09-29 MED ORDER — FUROSEMIDE 10 MG/ML IJ SOLN
60.0000 mg | Freq: Once | INTRAMUSCULAR | Status: AC
  Administered 2018-09-29: 60 mg via INTRAVENOUS
  Filled 2018-09-29: qty 6

## 2018-09-29 MED ORDER — FAMOTIDINE IN NACL 20-0.9 MG/50ML-% IV SOLN
20.0000 mg | INTRAVENOUS | Status: DC
  Administered 2018-09-29 – 2018-10-02 (×4): 20 mg via INTRAVENOUS
  Filled 2018-09-29 (×4): qty 50

## 2018-09-29 MED ORDER — ALBUTEROL SULFATE (2.5 MG/3ML) 0.083% IN NEBU
5.0000 mg | INHALATION_SOLUTION | Freq: Once | RESPIRATORY_TRACT | Status: AC
  Administered 2018-09-29: 5 mg via RESPIRATORY_TRACT
  Filled 2018-09-29: qty 6

## 2018-09-29 MED ORDER — SODIUM CHLORIDE 0.9% IV SOLUTION
Freq: Once | INTRAVENOUS | Status: AC
  Administered 2018-09-29: 11:00:00 via INTRAVENOUS

## 2018-09-29 MED ORDER — FUROSEMIDE 10 MG/ML IJ SOLN
20.0000 mg | Freq: Once | INTRAMUSCULAR | Status: AC
  Administered 2018-09-29: 20 mg via INTRAVENOUS
  Filled 2018-09-29: qty 2

## 2018-09-29 MED ORDER — IPRATROPIUM-ALBUTEROL 0.5-2.5 (3) MG/3ML IN SOLN
3.0000 mL | Freq: Four times a day (QID) | RESPIRATORY_TRACT | Status: DC
  Administered 2018-09-29 (×3): 3 mL via RESPIRATORY_TRACT
  Filled 2018-09-29 (×3): qty 3

## 2018-09-29 MED ORDER — SODIUM CHLORIDE 0.9 % IV SOLN
500.0000 mg | Freq: Every day | INTRAVENOUS | Status: DC
  Administered 2018-09-29 – 2018-10-02 (×4): 500 mg via INTRAVENOUS
  Filled 2018-09-29 (×5): qty 500

## 2018-09-29 NOTE — Progress Notes (Signed)
  Echocardiogram 2D Echocardiogram has been performed.  Jennette Dubin 09/27/2018, 10:55 AM

## 2018-09-29 NOTE — Telephone Encounter (Signed)
Called patient and talked to her son and she is in the hospital currently.

## 2018-09-29 NOTE — Consult Note (Addendum)
Cardiology Consultation:   Patient ID: [name] MRN: [id number]; DOB: 12/20/1932  Admit date: 09/27/2018 Date of Consult: 09/23/2018  Primary Care Provider: [name], MD Primary Cardiologist: Peter Martinique, MD Primary Electrophysiologist:  None   Patient Profile:   [name] HERNANDES is a 85 y.o. female with a hx of chronic diastolic HF, HTN, DM2, prior TIA, HL, breast CA, pulmonary HTN, chronic atrial fibrillation, chronic anticoagulation, chronic anemia, h/o epistaxis, dx of granulomatosis with polyangiitis (Wegener's)  who is being seen today for the evaluation of elevated troponin at the request of Dr. Claudie Leach, Critical Care Medicine.   History of Present Illness:   [name] [name] is a 85 y.o. female with a hx of chronic diastolic HF, HTN, DM2, prior TIA, HL, breast CA, pulmonary HTN and chronic atrial fibrillation.  She is on Eliquis (CHADS2-VASc=6) for anticoagulation. She has a  dx of granulomatosis with polyangiitis (Wegener's). Echo 04/14/13  EF 60-65%, trivial AI, mild-mod MR, mod to severe LAE, mild RVE, mod RAE, moderate TR,  PASP- 60 mm Hg. She is followed by Dr. Martinique. No h/o CAD.   She presented to the [facility] ED early morning hours today, via EMS, for syncope at home SOB and nausea. Pt reports that her husband and son were home with her at the time and she felt very weak and tired and was nauseated before she passed out. She denies any associated CP with her syncope. When EMS arrived to her home she was found to be hypoxic w/ O2 sats in the 50s on RA. She was placed on NRM by GMS and sats improved to 100%. She was normotensive on arrival, BP 139/80. RR 22. Temp 97.6. COVID negative. CXR showed diffuse bilateral airspace opacities, felt possibly secondary to an atypical infectious process such as viral PNA or developing pulmonary edema. BNP is elevated, and significantly higher than previous levels at 2,059. Also noted to have slight change in baseline Hgb, down from baseline ~7-8  to 6.3. FOBT pending and Eliquis is on hold. 2D Echo pending. Received a total of 80 mg IV Lasix this morning. Only 200 cc out in UOP recorded. Doubt accurate. Still on BiPAP. Just received 1 unit of blood. Cardiology consulted for elevated troponin. High sensitivity troponin trend 16>>21>>99. EKG shows chronic atrial fibrillation. No significant ST abnormalties. Also found to have an AKI w/ SCr at 3.01. last SCr prior to this admit was in Feb, when SCr was 1.68.   Heart Pathway Score:     Past Medical History:  Diagnosis Date   Arthritis    r tkr   Breast cancer ([place])    Chronic atrial fibrillation    CHADS2-VASc=6.  on low dose Eliquis (corrected for age & renal fxn)   CVA (cerebral vascular accident) ([place]) 2008   mini stroke.no residual   DEPRESSION 05/16/2009   Diabetes mellitus type II    Heart murmur    stress test 2009.dr peter Martinique   HYPERLIPIDEMIA 07/24/2008   HYPERTENSION 07/24/2008   HYPOTHYROIDISM 07/24/2008   Intermittent vertigo    Metastatic carcinoid tumor to intra-abdominal site [facility])    Pneumonia    Pulmonary hypertension ([place])    Mod PHTN on Echo 03/2016: 2/2 combination of Wegener's, HFPF & Afib.   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 EF 55-60%. No RWMA. Mild Aortic dilation Mild MR. Severe LA dilation. Mod RA dilation.  Mod TR with Mod-Severe Pulmonary  HTN - but normal RV function..   VENTRAL HERNIA REPAIR N/A 07/01/2016   Procedure: REPAIR OF INCARCERATED INCISIONAL HERNIA ;  Surgeon: Stark Klein, MD;  Location: [facility] ORS;  Service: General;  Laterality: N/A;   VIDEO ASSISTED THORACOSCOPY  04/22/2011   Procedure: VIDEO ASSISTED THORACOSCOPY;  Surgeon: Melrose Nakayama, MD;  Location: [place];  Service: Thoracic;  Laterality: Left;  WITH BIOPSY     Home Medications:  Prior to Admission medications   Medication Sig Start Date End Date Taking? Authorizing Provider  acetaminophen (TYLENOL) 500 MG tablet Take 1,000 mg by mouth every 6 (six) hours as needed for headache.    Yes [provider]  amLODipine (NORVASC) 5 MG tablet Take 1 tablet by mouth once daily Patient taking differently: Take 5 mg by mouth daily.  09/16/18  Yes Burchette, Alinda Sierras, MD  BYSTOLIC 10 MG tablet TAKE 1 TABLET BY MOUTH ONCE DAILY AFTER  BREAKFAST Patient taking differently: Take 10 mg by mouth daily.  02/22/18  Yes Burchette, Alinda Sierras, MD  diazepam (VALIUM) 5 MG tablet TAKE 1 TABLET BY MOUTH EVERY 12 HOURS AS NEEDED FOR SEVERE VERTIGO FLARES. AVOID REGULAR USE. Patient taking differently: Take 5 mg by mouth every 12 (twelve) hours as needed (vertigo flares).  11/02/17  Yes Burchette, Alinda Sierras, MD  famotidine (PEPCID) 10 MG tablet Take 1 tablet (10 mg total) by mouth 2 (two) times daily. 03/21/18  Yes Eugenie Filler, MD  feeding supplement, ENSURE ENLIVE, (ENSURE ENLIVE) LIQD Take 237 mLs by mouth 2 (two) times daily between meals. 09/18/17  Yes Debbe Odea, MD  fluticasone (FLONASE) 50 MCG/ACT nasal spray Place 2 sprays into both nostrils daily. 09/24/18  Yes Isaac Bliss, Rayford Halsted, MD  furosemide (LASIX) 20 MG tablet Take 1 tablet (20 mg total) by mouth daily as needed for fluid or edema. 06/25/18  Yes Burchette, Alinda Sierras, MD  gabapentin (NEURONTIN) 100 MG capsule TAKE 1 CAPSULE BY MOUTH TWICE DAILY BEFORE MEAL(S) Patient taking differently: Take 100 mg by  mouth 2 (two) times daily.  06/01/18  Yes Burchette, Alinda Sierras, MD  glimepiride (AMARYL) 2 MG tablet TAKE ONE TABLET BY MOUTH ONCE DAILY BEFORE BREAKFAST Patient taking differently: Take 2 mg by mouth daily as needed (high blood sugar).  10/07/16  Yes Burchette, Alinda Sierras, MD  guaiFENesin (MUCINEX) 600 MG 12 hr tablet Take 1 tablet (600 mg total) by mouth 2 (two) times daily as needed for cough. 09/17/17  Yes Debbe Odea, MD  levothyroxine (SYNTHROID, LEVOTHROID) 100 MCG tablet TAKE 1 TABLET BY MOUTH ONCE DAILY BEFORE BREAKFAST. Patient taking differently: Take 100 mcg by mouth daily before breakfast.  07/27/17  Yes Burchette, Alinda Sierras, MD  loratadine (CLARITIN) 10 MG tablet Take 10 mg by mouth daily.   Yes [provider]  Multiple Vitamins-Minerals (CENTRUM SILVER 50+WOMEN) TABS Take 1 tablet by mouth daily.  Yes [provider]  ondansetron (ZOFRAN-ODT) 4 MG disintegrating tablet Take 1 tablet (4 mg total) by mouth every 8 (eight) hours as needed for nausea or vomiting. 09/18/17  Yes Burchette, Alinda Sierras, MD  oxymetazoline (AFRIN) 0.05 % nasal spray Place 1 spray into both nostrils 2 (two) times daily as needed for congestion.   Yes [provider]  amoxicillin-clavulanate (AUGMENTIN) 875-125 MG tablet Take 1 tablet by mouth 2 (two) times daily. One po bid x 7 days Patient not taking: Reported on 10/04/2018 08/19/18   Deno Etienne, DO  apixaban (ELIQUIS) 2.5 MG TABS tablet Take 2.5 mg by mouth 2 (two) times daily.    [provider]  predniSONE (DELTASONE) 10 MG tablet Take three tablets once daily for 7 days. Patient not taking: Reported on 09/07/2018 08/17/18   [name], MD  predniSONE (DELTASONE) 20 MG tablet Take 2 tablets (40 mg total) by mouth daily with breakfast. Patient not taking: Reported on 10/04/2018 03/22/18   Eugenie Filler, MD    Inpatient Medications: Scheduled Meds:  Chlorhexidine Gluconate Cloth  6 each Topical Q0600    ipratropium-albuterol  3 mL Nebulization Q6H   levothyroxine  100 mcg Oral QAC breakfast   Continuous Infusions:  azithromycin 250 mL/hr at 09/15/2018 1200   famotidine (PEPCID) IV Stopped (09/06/2018 1140)   PRN Meds: acetaminophen, ondansetron (ZOFRAN) IV  Allergies:    Allergies  Allergen Reactions   Codeine Sulfate Other (See Comments)    REACTION: GI upset   Sulfonamide Derivatives Other (See Comments)    REACTION: GI upset   Vancomycin Other (See Comments)    Red man syndrome   Atarax [Hydroxyzine] Nausea Only and Rash    Social History:   Social History   Socioeconomic History   Marital status: Married    Spouse name: Not on file   Number of children: Not on file   Years of education: Not on file   Highest education level: Not on file  Occupational History   Not on file  Social Needs   Financial resource strain: Not on file   Food insecurity    Worry: Not on file    Inability: Not on file   Transportation needs    Medical: Not on file    Non-medical: Not on file  Tobacco Use   Smoking status: Former Smoker    Packs/day: 0.50    Years: 12.00    Pack years: 6.00    Types: Cigarettes    Quit date: 06/04/1982    Years since quitting: 36.3   Smokeless tobacco: Never Used  Substance and Sexual Activity   Alcohol use: No   Drug use: No   Sexual activity: Yes    Birth control/protection: Post-menopausal  Lifestyle   Physical activity    Days per week: Not on file    Minutes per session: Not on file   Stress: Not on file  Relationships   Social connections    Talks on phone: Not on file    Gets together: Not on file    Attends religious service: Not on file    Active member of club or organization: Not on file    Attends meetings of clubs or organizations: Not on file    Relationship status: Not on file   Intimate partner violence    Fear of current or ex partner: Not on file    Emotionally abused: Not on file    Physically  abused: Not on file  Forced sexual activity: Not on file  Other Topics Concern   Not on file  Social History Narrative   Lives with spouse who now has alz x 24 years   3 sons      Grand children; 63; 4 grandson and 3 granddtr   [employer]; 5 great grandson and 3 great granddtr   The youngest is 46 months     Family History:   Family History  Problem Relation Age of Onset   Arthritis Mother    Rheum arthritis Mother    Heart disease Father    Coronary artery disease Father    Cancer Sister        breast CA, both sisters   Breast cancer Sister    Coronary artery disease Brother    Lung cancer Brother      ROS:  Please see the history of present illness.  All other ROS reviewed and negative.     Physical Exam/Data:   Vitals:   10/01/2018 1308 09/15/2018 1315 10/05/2018 1328 10/05/2018 1330  BP:  101/62  (!) 110/59  Pulse:  62  62  Resp:  17  17  Temp:      TempSrc:      SpO2: 100% 100%  100%  Weight:   66 kg   Height:   _0  (1.676 m)     Intake/Output Summary (Last 24 hours) at 09/16/2018 1434 Last data filed at 09/09/2018 1200 Gross per 24 hour  Intake 126.97 ml  Output 485 ml  Net -358.03 ml   Last 3 Weights 09/16/2018 08/19/2018 08/19/2018  Weight (lbs) 145 lb 8.1 oz 143 lb 144 lb  Weight (kg) 66 kg 64.864 kg 65.318 kg     Body mass index is 23.48 kg/m.  General:  Elderly WF on Bipap. No acute distress HEENT: normal Lymph: no adenopathy Neck: no JVD Endocrine:  No thryomegaly Vascular: No carotid bruits; FA pulses 2+ bilaterally without bruits  Cardiac: irregularly irregular rhythm, regular rate mild 2/6 diastolic murmur at LUSB Lungs:  Inspiratory rhonchi bilaterally  Abd: soft, nontender, no hepatomegaly  Ext: no edema Musculoskeletal:  No deformities, BUE and BLE strength normal and equal Skin: warm and dry  Neuro:  CNs 2-12 intact, no focal abnormalities noted Psych:  Normal affect   EKG:  The EKG was personally reviewed and demonstrates:   Chronic atrial fibrillation w/ CVR 73 bpm, borderline Twave abnormalties, inverted Twave in III V6  Telemetry:  Telemetry was personally reviewed and demonstrates:  Chronic atrial fibrillation HR in the 60s.   Relevant CV Studies: 2D Echo pending.   Laboratory Data:  High Sensitivity Troponin:   Recent Labs  Lab 09/25/2018 0145 09/24/2018 0326 09/09/2018 0918  TROPONINIHS 16 21* 99*     Cardiac EnzymesNo results for input(s): TROPONINI in the last 168 hours. No results for input(s): TROPIPOC in the last 168 hours.  Chemistry Recent Labs  Lab 09/14/2018 0145 09/10/2018 0841 09/06/2018 0918  NA 132* 133* 134*  K 4.4 4.4 4.3  CL 100  --  101  CO2 20*  --  22  GLUCOSE 204*  --  152*  BUN 42*  --  42*  CREATININE 3.01*  --  3.01*  CALCIUM 8.3*  --  8.2*  GFRNONAA 14*  --  14*  GFRAA 16*  --  16*  ANIONGAP 12  --  11    Recent Labs  Lab 09/06/2018 0145 09/15/2018 0918  PROT 6.9 6.9  ALBUMIN 2.9* 2.9*  AST 17 17  ALT 8 8  ALKPHOS 71 61  BILITOT 1.2 1.3*   Hematology Recent Labs  Lab 09/24/2018 0145 09/06/2018 0841 09/06/2018 0918  WBC 10.2  --  11.4*  RBC 2.11*  --  1.97*  HGB 6.6* 9.2* 6.3*  HCT 21.9* 27.0* 19.3*  MCV 103.8*  --  98.0  MCH 31.3  --  32.0  MCHC 30.1  --  32.6  RDW 16.7*  --  16.4*  PLT 399  --  351   BNP Recent Labs  Lab 09/19/2018 0145  BNP 2,059.3*    DDimer No results for input(s): DDIMER in the last 168 hours.   Radiology/Studies:  Dg Chest Portable 1 View  Result Date: 09/15/2018 CLINICAL DATA:  Hypoxia.  Syncope. EXAM: PORTABLE CHEST 1 VIEW COMPARISON:  04/09/2018 FINDINGS: Heart size is enlarged. There are multifocal airspace opacities bilaterally. There are prominent interstitial lung markings. There may be small bilateral pleural effusions. There is no acute osseous abnormality. No pneumothorax. IMPRESSION: 1. Diffuse bilateral airspace opacities may be secondary to an atypical infectious process such as viral pneumonia or developing pulmonary  edema. 2. Persistent cardiomegaly. Electronically Signed   By: Constance Holster M.D.   On: 09/14/2018 02:05   Vas [place] Lower Extremity Venous (dvt)  Result Date: 09/06/2018  Lower Venous Study Indications: Swelling, and SOB.  Risk Factors: Cancer H/O colon ccancer Hypertension, Pulmaonry HTH, Chronic respiratory failure, H/O Wegeners, A fib on eliquis. Limitations: Respiratory interference due to patient being on BiPAP. Comparison Study: Only comparison available as completed on the right leg                   01/01/07 which was negative. Performing Technologist: Toma Copier RVS  Examination Guidelines: A complete evaluation includes [name]-mode imaging, spectral Doppler, color Doppler, and power Doppler as needed of all accessible portions of each vessel. Bilateral testing is considered an integral part of a complete examination. Limited examinations for reoccurring indications may be performed as noted.  +---------+---------------+---------+-----------+----------+------------------+  RIGHT     Compressibility Phasicity Spontaneity Properties Summary             +---------+---------------+---------+-----------+----------+------------------+  CFV       Full            Yes       Yes                                        +---------+---------------+---------+-----------+----------+------------------+  SFJ       Full                                                                 +---------+---------------+---------+-----------+----------+------------------+  FV Prox   Full            Yes       Yes                                        +---------+---------------+---------+-----------+----------+------------------+  FV Mid    Full                                                                 +---------+---------------+---------+-----------+----------+------------------+  FV Distal Full            Yes       Yes                                         +---------+---------------+---------+-----------+----------+------------------+  PFV       Full            Yes       Yes                                        +---------+---------------+---------+-----------+----------+------------------+  POP       Full            Yes       Yes                                        +---------+---------------+---------+-----------+----------+------------------+  PTV       Full                                                                 +---------+---------------+---------+-----------+----------+------------------+  PERO      Full                                             Difficult to image  +---------+---------------+---------+-----------+----------+------------------+   +---------+---------------+---------+-----------+----------+-------------------+  LEFT      Compressibility Phasicity Spontaneity Properties Summary              +---------+---------------+---------+-----------+----------+-------------------+  CFV       Partial                                                               +---------+---------------+---------+-----------+----------+-------------------+  SFJ       Full                                                                  +---------+---------------+---------+-----------+----------+-------------------+  FV Prox   Full                                                                  +---------+---------------+---------+-----------+----------+-------------------+  FV Mid    Full                                                                  +---------+---------------+---------+-----------+----------+-------------------+  FV Distal Full                                                                  +---------+---------------+---------+-----------+----------+-------------------+  PFV       Full                                                                  +---------+---------------+---------+-----------+----------+-------------------+  POP        Full                                                                  +---------+---------------+---------+-----------+----------+-------------------+  PTV       Full                                                                  +---------+---------------+---------+-----------+----------+-------------------+  PERO                                                       Unable to visualize                                                              well enough to                                                                   evaluate             +---------+---------------+---------+-----------+----------+-------------------+   Left Technical Findings: Not visualized segments include peroneal. Unable to image well enough to evaluate.   Summary: Right: There is no evidence of deep vein thrombosis in the lower extremity. No cystic structure found in the popliteal fossa. Left: There is no evidence of deep vein thrombosis in the lower extremity. However, portions of this examination were limited- see technologist comments above. No cystic structure found in the popliteal fossa.  *See table(s) above for measurements and observations. Electronically signed by Harold Barban MD on 09/09/2018 at 2:18:59 PM.    Final     Assessment and  Plan:   LODA BIALAS is a 85 y.o. female with a hx of chronic diastolic HF, HTN, DM2, prior TIA, HL, chronic renal insuffiency,  breast CA, pulmonary HTN, chronic atrial fibrillation, chronic anticoagulation, chronic anemia, h/o epistasis requiring blood transfusions,  dx of granulomatosis with polyangiitis (Wegener's)  who is being seen today for the evaluation of elevated troponin at the request of Dr. Claudie Leach, Critical Care Medicine.   1. Elevated Troponin: high sensitivity troponin trend 16>>21>>99. EKG shows chronic afib that is rate controlled with no acute ST abnormalities, 2D echo pending. She denies any ischemic CP symptoms. She complains of dyspnea, but in the setting of  severe anemia, possible PNA and acute CHF. Also w/ an AKI w/ SCr at 3.01 (up from  1.68 4 months ago)Based on enzyme elevation, suspect this is demand ischemia from worsening anemia, worsening renal function and acute hypoxic respiratory failure, PNA and CHF. Symptoms, EKG and troponin trend not c/w acute coronary syndrome. In addition, given her chronic/worsening anemia and AKI, she would not be a good candidate for invasive cardiac cath. However, given that her trend is rising, we will continue to cycle to ensure level trends downward.   2. Acute on Chronic Diastolic HF: BNP was markedly elevated and much higher than usual baseline at  2,059. However this may be falsely elevated given her age and renal function. She has received a total of 80 mg IV Lasix since admit. She does not appear grossly volume overloaded on exam. Hold additional lasix for now given AKI w/ Scr now at 3.01. Can reassess in the AM. Still SOB and requiring BiPAP. I think that her anemia is likely contributing. 2D Echo pending.   3. ? PNA: on antibiotics. Management per primary team.   4. Acute on Chronic Anemia: baseline Hgb ~8. 6.6 on admit. Symptomatic w/ weakness and syncope. Just received 1 unit of blood. FOBT pending. Eliquis on hold.   5. Chronic Atrial Fibrillation: rate controlled in the 60s-70s on tele. Was on bystolic as outpatient but currently on hold. Monitor on tele. Eliquis is on hold due to anemia. Pt reports that she has actually not taken this in 6 months.  ? GI source. FOBT pending. She apparently also has a h/o epistaxis in the past that also require blood transfusions. Recommend permanent discontinuation anticoagulation due to bleed risk. Do Not resume Eliquis.   6. AKI: SCr elevated at 3.01. SCr in Feb was 1.68. got IV Lasix in the ED for acute CHF. No additional lasix ordered. Will continue to hold for now. ? If dehydration. Avoid nephrotoxic agents. Monitor BMP closely.     For questions or updates, please  contact [place] Please consult [email or web address] for contact info under     Signed, Nelida Gores  09/08/2018 2:34 PM   Agree with note written by Ellen Henri  [facility]  Agree with note by Ellen Henri PA-C.  Ms. Volanda Napoleon is an 85 year old frail-appearing married Caucasian female patient of Dr. Doug Sou admitted with hypoxic respiratory failure requiring BiPAP.  We are asked to see her because of mildly elevated troponin.  She has a history of chronic diastolic heart failure, hypertension, diabetes and chronic atrial fibrillation on oral anticoagulation in the past which was apparently discontinued 6 months ago.  She began feeling weak yesterday and had increase shortness of breath.  She is currently satting well on BiPAP.  She is in A. fib with controlled ventricular response.  Her hemoglobin is 6.6 and her FOBT is  pending.  She is currently getting 1 unit of packed red blood cells.  Her chest x-ray shows patchy infiltrates bilaterally and she is on IV antibiotics for presumed pneumonia.  Her serum creatinine also is elevated from 1.7 up to 3.  She is on as needed diuretics at home.  I do not think that her mildly elevated troponins represent ACS.  I suspect this is demand ischemia.  She is not a candidate for oral anticoagulants in the future given her low hemoglobin despite being in A. fib.  Would probably get the renal service to see her as well.  We will continue to follow her with you.  Quay Burow 09/21/2018 4:00 PM

## 2018-09-29 NOTE — H&P (Signed)
Name: [name] [name] MRN: [id number] DOB: 1932/10/27 Date of service : 09/19/2018 Cc: SOB   LOS: 0  [facility] / Critical Care Note   History of Present Illness: 85 yo female with hx of HTN, pulm HTH, chronic resp failure on home oxygen, hx of wegeners, afib on eliquis, hx of colon cancer who presented from home today with worsening shortness of breath for a day. Her husband called EMS , she arrived in the ED with sats in the 50's , improved with NRB and then placed on BIPAP. During my evaluation the patient is on BIPAP at 40%, sats are normal she is awake and able to say some words, says she feels much better.  ROS+ N/V , no fevers or chills , no sputum production, denies any chest pain.   Lines / Drains: PIV   Cultures: PENDING   Antibiotics: azithromycin   Tests / Events: COVID19 negative    Past Medical History:  Diagnosis Date  . Arthritis    r tkr  . Breast cancer (HCC)   . Chronic atrial fibrillation    CHADS2-VASc=6.  on low dose Eliquis (corrected for age & renal fxn)  . CVA (cerebral vascular accident) (HCC) 2008   mini stroke.no residual  . DEPRESSION 05/16/2009  . Diabetes mellitus type II   . Heart murmur    stress test 2009.dr peter [place]  . HYPERLIPIDEMIA 07/24/2008  . HYPERTENSION 07/24/2008  . HYPOTHYROIDISM 07/24/2008  . Intermittent vertigo   . Metastatic carcinoid tumor to intra-abdominal site (HCC)   . Pneumonia   . Pulmonary hypertension (HCC)    Mod PHTN on Echo 03/2016: 2/2 combination of Wegener's, HFPF & Afib.  . Skin abnormality    facial lesions .pt applying mupiracin to areas  . Vertigo   . 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 BREAST SURGERY  2007   Lumpectomy, XRT 2006.l breast  . CHOLECYSTECTOMY  1980  . EXPLORATORY LAPAROTOMY    . HEEL SPUR SURGERY Right   . JOINT REPLACEMENT     r knee  . KNEE SURGERY  2009   TKR  . TEE WITHOUT CARDIOVERSION N/A 09/16/2017   Procedure: TRANSESOPHAGEAL ECHOCARDIOGRAM (TEE);  Surgeon: Laurey Morale, MD;  Location[facility] ENDOSCOPY;  Service: Cardiovascular;  Laterality: N/A;  . TRANSTHORACIC ECHOCARDIOGRAM  03/2016   Afib (no Diastolic Fxn). EF 55-60%. No RWMA. Mild Aortic dilation Mild MR. Severe LA dilation. Mod RA dilation.  Mod TR with Mod-Severe Pulmonary HTN - but normal RV function..  . VENTRAL HERNIA REPAIR N/A 07/01/2016   Procedure: REPAIR OF INCARCERATED INCISIONAL HERNIA ;  Surgeon: Almond Lint, MD;  Location: [facility] ORS;  Service: General;  Laterality: N/A;  . VIDEO ASSISTED THORACOSCOPY  04/22/2011   Procedure: VIDEO ASSISTED THORACOSCOPY;  Surgeon: Loreli Slot, MD;  Location: [facility] OR;  Service: Thoracic;  Laterality: Left;  WITH BIOPSY    Prior to Admission medications   Medication Sig Start Date End Date Taking? Authorizing Provider  acetaminophen (TYLENOL) 500 MG tablet Take 1,000 mg by mouth every 6 (six) hours as needed for headache.     [provider]  amLODipine (NORVASC) 5 MG tablet Take 1 tablet by mouth once daily 09/16/18   Burchette, Elberta Fortis, MD  amoxicillin-clavulanate (AUGMENTIN) 875-125 MG tablet Take 1 tablet by mouth 2 (two) times daily. One po bid x 7 days 08/19/18   Melene Plan, DO  apixaban (ELIQUIS) 2.5 MG TABS  tablet Take 2.5 mg by mouth 2 (two) times daily.    [provider]  BYSTOLIC 10 MG tablet TAKE 1 TABLET BY MOUTH ONCE DAILY AFTER  BREAKFAST Patient taking differently: Take 10 mg by mouth daily.  02/22/18   Burchette, Elberta Fortis, MD  diazepam (VALIUM) 5 MG tablet TAKE 1 TABLET BY MOUTH EVERY 12 HOURS AS NEEDED FOR SEVERE VERTIGO FLARES. AVOID REGULAR USE. Patient taking differently: Take 5 mg by mouth every 12 (twelve) hours as needed (vertigo flares).  11/02/17   Burchette, Elberta Fortis, MD  famotidine (PEPCID) 10 MG tablet Take 1 tablet (10 mg total) by mouth 2 (two) times daily. 03/21/18   Rodolph Bong, MD  feeding supplement, ENSURE ENLIVE, (ENSURE ENLIVE)  LIQD Take 237 mLs by mouth 2 (two) times daily between meals. 09/18/17   Calvert Cantor, MD  fluticasone (FLONASE) 50 MCG/ACT nasal spray Place 2 sprays into both nostrils daily. 09/24/18   Philip Aspen, Limmie Patricia, MD  furosemide (LASIX) 20 MG tablet Take 1 tablet (20 mg total) by mouth daily as needed for fluid or edema. 06/25/18   Burchette, Elberta Fortis, MD  gabapentin (NEURONTIN) 100 MG capsule TAKE 1 CAPSULE BY MOUTH TWICE DAILY BEFORE MEAL(S) Patient taking differently: Take 100 mg by mouth 2 (two) times daily.  06/01/18   Burchette, Elberta Fortis, MD  glimepiride (AMARYL) 2 MG tablet TAKE ONE TABLET BY MOUTH ONCE DAILY BEFORE BREAKFAST Patient taking differently: Take 2 mg by mouth daily as needed (high blood sugar).  10/07/16   Burchette, Elberta Fortis, MD  guaiFENesin (MUCINEX) 600 MG 12 hr tablet Take 1 tablet (600 mg total) by mouth 2 (two) times daily as needed for cough. Patient not taking: Reported on 06/14/2018 09/17/17   Calvert Cantor, MD  levothyroxine (SYNTHROID, LEVOTHROID) 100 MCG tablet TAKE 1 TABLET BY MOUTH ONCE DAILY BEFORE BREAKFAST. Patient taking differently: Take 100 mcg by mouth daily before breakfast.  07/27/17   Burchette, Elberta Fortis, MD  loratadine (CLARITIN) 10 MG tablet Take 10 mg by mouth daily.    [provider]  Multiple Vitamins-Minerals (CENTRUM SILVER 50+WOMEN) TABS Take 1 tablet by mouth daily.     [provider]  ondansetron (ZOFRAN-ODT) 4 MG disintegrating tablet Take 1 tablet (4 mg total) by mouth every 8 (eight) hours as needed for nausea or vomiting. 09/18/17   Burchette, Elberta Fortis, MD  oxymetazoline (AFRIN) 0.05 % nasal spray Place 1 spray into both nostrils 2 (two) times daily as needed for congestion.    [provider]  predniSONE (DELTASONE) 10 MG tablet Take three tablets once daily for 7 days. Patient taking differently: 30 mg daily with breakfast. Take three tablets once daily for 7 days. 08/17/18   Burchette, Elberta Fortis, MD  predniSONE (DELTASONE) 20  MG tablet Take 2 tablets (40 mg total) by mouth daily with breakfast. 03/22/18   Rodolph Bong, MD    Allergies Allergies  Allergen Reactions  . Codeine Sulfate Other (See Comments)    REACTION: GI upset  . Sulfonamide Derivatives Other (See Comments)    REACTION: GI upset  . Vancomycin Other (See Comments)    Red man syndrome  . Atarax [Hydroxyzine] Nausea Only and Rash    Family History Family History  Problem Relation Age of Onset  . Arthritis Mother   . Rheum arthritis Mother   . Heart disease Father   . Coronary artery disease Father   . Cancer Sister  breast CA, both sisters  . Breast cancer Sister   . Coronary artery disease Brother   . Lung cancer Brother     Social History  reports that she quit smoking about 36 years ago. Her smoking use included cigarettes. She has a 6.00 pack-year smoking history. She has never used smokeless tobacco. She reports that she does not drink alcohol or use drugs.  Review Of Systems:   Vital Signs: Temp:  [98.1 F (36.7 C)] 98.1 F (36.7 C) (06/24 0131) Pulse Rate:  [25-89] 61 (06/24 0530) Resp:  [14-33] 26 (06/24 0530) BP: (104-128)/(58-81) 118/77 (06/24 0530) SpO2:  [88 %-100 %] 100 % (06/24 0530) FiO2 (%):  [40 %] 40 % (06/24 0411) No intake/output data recorded.  Physical Examination: General: AAO3 , mild resp distress Neuro: moving all extremities and following commands CV: RRR , no m/r/g PULM: crackles b/l , no w or ronchi GI: BS + SOFT , NT ND Extremities:  Trace pedal edema in chronic venous stasis. , intact DP pulses.   Ventilator settings: FiO2 (%):  [40 %] 40 %  Labs    CBC Recent Labs  Lab 09/14/2018 0145  HGB 6.6*  HCT 21.9*  WBC 10.2  PLT 399     BMET Recent Labs  Lab 09/15/2018 0145  NA 132*  K 4.4  CL 100  CO2 20*  GLUCOSE 204*  BUN 42*  CREATININE 3.01*  CALCIUM 8.3*    No results for input(s): INR in the last 168 hours.  No results for input(s): PHART, PCO2ART, PO2ART,  HCO3, TCO2, O2SAT in the last 168 hours.  Invalid input(s): PCO2, PO2   Radiology: CXR - intersticial infiltrates    Assessment and Plan: Principal Problem:   Acute on chronic respiratory failure with hypoxemia (HCC) Active Problems:   Hypothyroidism   Anemia   Diastolic CHF, chronic (HCC) - on low dose Lasix. ARB & BB along with Norvasc   Chronic atrial fibrillation (HCC) CHADS2-VASc=6.  On Corrected "low" dose of Eliquis   CKD (chronic kidney disease) stage 3, GFR 30-59 ml/min (HCC)  Neuro - no issues , hold gabapentin  [facility]- HD stable , hx of diastolic dysfunction , moderate pulm htn and severe TR BNP elevated , suspect acute on chronic diastolic CHF , start diuresis , 60 mg iv x1 Cycle troponins HX OF afib , hold BP meds , hold eliquis given severe anemia Check TTE  Lungs - acute on chronic resp failure with hypoxemia secondary to above , less likely infectious, will start azithro empirically , order cultures and procalcitonin levels Cont BIPAP , will hopefully be able to de-escalate if starts making urine  ID - check cultures and PCT , azithro empirically, urinalysis is clean, covid neg  GI - npo except meds  Renal - AKI on CKD3 , place foley , diuresis, unclear etiology of her renal failure at this point  Hematology - anemia , Hg 6.6 not far from her baseline of ~8 . Hold eliquis , repeat CBC, check stool occult blood and send type and screen   Best practices / Disposition: -->Code Status: full -->DVT Px: scd's -->GI Px: pepcid -->Diet:npo except meds for now  Critical care time spent : 45 minutes   Jessel Gettinger R. Faolan [place] MD  09/26/2018, 6:04 AM

## 2018-09-29 NOTE — ED Notes (Signed)
Attempted to transfer pt to nasal cannula at 6 lpm on arrival. SpO2 dropped to 79%. Pt placed back on non-rebreather at 15lpm.

## 2018-09-29 NOTE — ED Triage Notes (Signed)
Pt brought to ED by [facility] from home c/o SOB and nausea, on EMS arrival pt's SPO2 58% on RA, place on NRM by [facility] and SPO2 up to 100% no more c/o nausea, Pt AO x 4 no distress noticed on the NRM. VS BP 139/80. R22, HR 81, SPO2 100% NRM. Temp 97.6 tympanic.

## 2018-09-29 NOTE — ED Provider Notes (Signed)
[facility] EMERGENCY DEPARTMENT Provider Note   CSN: [id number] Arrival date & time: 09/14/2018  0120     History   Chief Complaint Chief Complaint  Patient presents with  . Shortness of Breath    HPI [name] [name] is a 85 y.o. female.     Very pleasant 85 yo woman comes in by EMS for evaluation of hypoxia. She states she was getting ready for bed, was walking from the bathroom to her bed and "went to my knees". She denies having any symptoms prior to this, but feels she couldn't hold herself up. No syncope. She has a history of chronic A-fib, CVA, DM, HLD, HTN, pulmonary HTN, dCHF, CKD. She uses oxygen at night only. Currently, she denies pain, nausea, recent illness. She denies feeling SOB at any time tonight but that her husband told her she looked like she was breathing hard.   The history is provided by the patient and the EMS personnel. No language interpreter was used.  Shortness of Breath Associated symptoms: no abdominal pain, no chest pain, no cough, no diaphoresis, no fever, no headaches and no vomiting     Past Medical History:  Diagnosis Date  . Arthritis    r tkr  . Breast cancer ([place])   . Chronic atrial fibrillation    CHADS2-VASc=6.  on low dose Eliquis (corrected for age & renal fxn)  . CVA (cerebral vascular accident) ([place]) 2008   mini stroke.no residual  . DEPRESSION 05/16/2009  . Diabetes mellitus type II   . Heart murmur    stress test 2009.dr peter Martin[place]  . HYPERLIPIDEMIA 07/24/2008  . HYPERTENSION 07/24/2008  . HYPOTHYROIDISM 07/24/2008  . Intermittent vertigo   . Metastatic carcinoid tumor to intra-abdominal site ([place])   . Pneumonia   . Pulmonary hypertension ([place])    Mod PHTN on Echo 03/2016: 2/2 combination of Wegener's, HFPF & Afib.  . Skin abnormality    facial lesions .pt applying mupiracin to areas  . Vertigo   . 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  Epistaxis 03/29/2018  . Acute renal failure superimposed on stage 4 chronic kidney disease ([place]) 03/20/2018  . Pneumonia 03/15/2018  . Community acquired pneumonia 09/17/2017  . [facility] 09/16/2017  . Acute on chronic respiratory failure with hypoxia ([place]) 09/29/2016  . Malnutrition of moderate degree 07/01/2016  . Incarcerated hernia 06/30/2016  . Recurrent umbilical hernia with incarceration 06/30/2016  . Cellulitis and abscess   . Diabetic foot ulcer ([place]) 12/10/2015  . Hereditary and idiopathic peripheral neuropathy 09/25/2014  . Dependent edema 09/25/2014  . UTI (lower urinary tract infection)   . Type 2 diabetes mellitus with neurological complications ([place]) 40/98/1191  . Chronic atrial fibrillation (HCC) CHADS2-VASc=6.  On Corrected "low" dose of Eliquis 04/04/2014  . CKD (chronic kidney disease) stage 3, GFR 30-59 ml/min (HCC) 04/04/2014  . Abdominal hernia without obstruction or gangrene 01/09/2014  . Nausea alone 08/21/2013  . Diastolic CHF, chronic (HCC) - on low dose Lasix. ARB & BB along with Norvasc 08/18/2013  . Sepsis ([place]) 08/18/2013  . Hand pain, left 08/18/2013  . Chronic kidney disease (CKD), stage IV (severe) ([place]) 05/08/2013  . Normocytic anemia 05/08/2013  . Pulmonary hypertension ([place]) 05/07/2013  . Right-sided heart failure ([place]) 05/07/2013  . Pancreatic cyst 04/17/2013  . Acute respiratory failure with hypoxemia ([place]) 04/13/2013  . Obesity (BMI 30-39.9) 12/28/2012  . Wegener's granulomatosis (  granulomatosis with polyangiitis) ([place]) 11/02/2012  . Cough with hemoptysis 11/01/2012  . Anemia 11/01/2012  . Dehydration 11/01/2012  . Diffuse pulmonary alveolar hemorrhage 11/01/2012  . Breast cancer ([place]) 09/14/2012  . Pruritus of skin 05/20/2012  . Carcinoid tumor of colon, malignant ([place]) 05/16/2011  . Multifocal pneumonia 04/05/2011  . Vertigo, intermittent 10/21/2010  . Edema 03/25/2010  . Hyponatremia 02/13/2010  . DEPRESSION  05/16/2009  . Hypothyroidism 07/24/2008  . Hyperlipidemia 07/24/2008  . Essential hypertension 07/24/2008  . OSTEOARTHRITIS 07/24/2008    Past Surgical History:  Procedure Laterality Date  . BREAST SURGERY  2007   Lumpectomy, XRT 2006.l breast  . CHOLECYSTECTOMY  1980  . EXPLORATORY LAPAROTOMY    . HEEL SPUR SURGERY Right   . JOINT REPLACEMENT     r knee  . KNEE SURGERY  2009   TKR  . TEE WITHOUT CARDIOVERSION N/A 09/16/2017   Procedure: TRANSESOPHAGEAL ECHOCARDIOGRAM (TEE);  Surgeon: Larey Dresser, MD;  [facility];  Service: Cardiovascular;  Laterality: N/A;  . TRANSTHORACIC ECHOCARDIOGRAM  03/2016   Afib (no Diastolic Fxn). EF 55-60%. No RWMA. Mild Aortic dilation Mild MR. Severe LA dilation. Mod RA dilation.  Mod TR with Mod-Severe Pulmonary HTN - but normal RV function..  . VENTRAL HERNIA REPAIR N/A 07/01/2016   Procedure: REPAIR OF INCARCERATED INCISIONAL HERNIA ;  Surgeon: Stark Klein, MD;  Location: [facility] ORS;  Service: General;  Laterality: N/A;  . VIDEO ASSISTED THORACOSCOPY  04/22/2011   Procedure: VIDEO ASSISTED THORACOSCOPY;  Surgeon: Melrose Nakayama, MD;  Location: [facility];  Service: Thoracic;  Laterality: Left;  WITH BIOPSY     OB History   No obstetric history on file.      Home Medications    Prior to Admission medications   Medication Sig Start Date End Date Taking? Authorizing Provider  acetaminophen (TYLENOL) 500 MG tablet Take 1,000 mg by mouth every 6 (six) hours as needed for headache.     [provider]  amLODipine (NORVASC) 5 MG tablet Take 1 tablet by mouth once daily 09/16/18   Burchette, Alinda Sierras, MD  amoxicillin-clavulanate (AUGMENTIN) 875-125 MG tablet Take 1 tablet by mouth 2 (two) times daily. One po bid x 7 days 08/19/18   Deno Etienne, DO  apixaban (ELIQUIS) 2.5 MG TABS tablet Take 2.5 mg by mouth 2 (two) times daily.    [provider]  BYSTOLIC 10 MG tablet TAKE 1 TABLET BY MOUTH ONCE DAILY AFTER  BREAKFAST  Patient taking differently: Take 10 mg by mouth daily.  02/22/18   Burchette, Alinda Sierras, MD  diazepam (VALIUM) 5 MG tablet TAKE 1 TABLET BY MOUTH EVERY 12 HOURS AS NEEDED FOR SEVERE VERTIGO FLARES. AVOID REGULAR USE. Patient taking differently: Take 5 mg by mouth every 12 (twelve) hours as needed (vertigo flares).  11/02/17   Burchette, Alinda Sierras, MD  famotidine (PEPCID) 10 MG tablet Take 1 tablet (10 mg total) by mouth 2 (two) times daily. 03/21/18   Eugenie Filler, MD  feeding supplement, ENSURE ENLIVE, (ENSURE ENLIVE) LIQD Take 237 mLs by mouth 2 (two) times daily between meals. 09/18/17   Debbe Odea, MD  fluticasone (FLONASE) 50 MCG/ACT nasal spray Place 2 sprays into both nostrils daily. 09/24/18   Isaac Bliss, Rayford Halsted, MD  furosemide (LASIX) 20 MG tablet Take 1 tablet (20 mg total) by mouth daily as needed for fluid or edema. 06/25/18   Burchette, Alinda Sierras, MD  gabapentin (NEURONTIN) 100 MG capsule TAKE 1 CAPSULE BY  MOUTH TWICE DAILY BEFORE MEAL(S) Patient taking differently: Take 100 mg by mouth 2 (two) times daily.  06/01/18   Burchette, Alinda Sierras, MD  glimepiride (AMARYL) 2 MG tablet TAKE ONE TABLET BY MOUTH ONCE DAILY BEFORE BREAKFAST Patient taking differently: Take 2 mg by mouth daily as needed (high blood sugar).  10/07/16   Burchette, Alinda Sierras, MD  guaiFENesin (MUCINEX) 600 MG 12 hr tablet Take 1 tablet (600 mg total) by mouth 2 (two) times daily as needed for cough. Patient not taking: Reported on 06/14/2018 09/17/17   Debbe Odea, MD  levothyroxine (SYNTHROID, LEVOTHROID) 100 MCG tablet TAKE 1 TABLET BY MOUTH ONCE DAILY BEFORE BREAKFAST. Patient taking differently: Take 100 mcg by mouth daily before breakfast.  07/27/17   Burchette, Alinda Sierras, MD  loratadine (CLARITIN) 10 MG tablet Take 10 mg by mouth daily.    [provider]  Multiple Vitamins-Minerals (CENTRUM SILVER 50+WOMEN) TABS Take 1 tablet by mouth daily.     [provider]  ondansetron (ZOFRAN-ODT) 4 MG  disintegrating tablet Take 1 tablet (4 mg total) by mouth every 8 (eight) hours as needed for nausea or vomiting. 09/18/17   Burchette, Alinda Sierras, MD  oxymetazoline (AFRIN) 0.05 % nasal spray Place 1 spray into both nostrils 2 (two) times daily as needed for congestion.    [provider]  predniSONE (DELTASONE) 10 MG tablet Take three tablets once daily for 7 days. Patient taking differently: 30 mg daily with breakfast. Take three tablets once daily for 7 days. 08/17/18   Burchette, Alinda Sierras, MD  predniSONE (DELTASONE) 20 MG tablet Take 2 tablets (40 mg total) by mouth daily with breakfast. 03/22/18   Eugenie Filler, MD    Family History Family History  Problem Relation Age of Onset  . Arthritis Mother   . Rheum arthritis Mother   . Heart disease Father   . Coronary artery disease Father   . Cancer Sister        breast CA, both sisters  . Breast cancer Sister   . Coronary artery disease Brother   . Lung cancer Brother     Social History Social History   Tobacco Use  . Smoking status: Former Smoker    Packs/day: 0.50    Years: 12.00    Pack years: 6.00    Types: Cigarettes    Quit date: 06/04/1982    Years since quitting: 36.3  . Smokeless tobacco: Never Used  Substance Use Topics  . Alcohol use: No  . Drug use: No     Allergies   Codeine sulfate, Other, Sulfonamide derivatives, Vancomycin, and Atarax [hydroxyzine]   Review of Systems Review of Systems  Constitutional: Negative for chills, diaphoresis and fever.  HENT: Negative.   Respiratory: Positive for shortness of breath (See HPI). Negative for cough.   Cardiovascular: Negative.  Negative for chest pain.  Gastrointestinal: Negative.  Negative for abdominal pain, nausea and vomiting.  Genitourinary: Negative.  Negative for dysuria.  Musculoskeletal: Negative.  Negative for myalgias.  Skin: Negative.  Negative for color change.  Neurological: Positive for weakness. Negative for syncope, speech difficulty  and headaches.     Physical Exam Updated Vital Signs BP (!) 128/58 (BP Location: Right Arm)   Pulse 79   Temp 98.1 F (36.7 C) (Oral)   Resp (!) 23   SpO2 100%   Physical Exam Vitals signs and nursing note reviewed.  Constitutional:      General: She is not in acute distress.  Appearance: She is well-developed.     Comments: Frail elderly woman, alert, oriented  HENT:     Head: Normocephalic.  Neck:     Musculoskeletal: Normal range of motion and neck supple.  Cardiovascular:     Rate and Rhythm: Normal rate. Rhythm irregular.  Pulmonary:     Effort: Pulmonary effort is normal.     Breath sounds: Normal breath sounds.     Comments: Significantly decreased air movement all fields. Rales, wheezing present. Prolonged expirations.  Abdominal:     General: Bowel sounds are normal.     Palpations: Abdomen is soft.     Tenderness: There is no abdominal tenderness. There is no guarding or rebound.  Musculoskeletal: Normal range of motion.     Right lower leg: No edema.     Left lower leg: No edema.  Skin:    General: Skin is warm and dry.     Coloration: Skin is pale.  Neurological:     General: No focal deficit present.     Mental Status: She is alert and oriented to person, place, and time.      ED Treatments / Results  Labs (all labs ordered are listed, but only abnormal results are displayed) Labs Reviewed - No data to display  EKG    Radiology No results found.  Procedures Procedures (including critical care time) CRITICAL CARE Performed by: Dewaine Oats   Total critical care time: 45 minutes  Critical care time was exclusive of separately billable procedures and treating other patients.  Critical care was necessary to treat or prevent imminent or life-threatening deterioration.  Critical care was time spent personally by me on the following activities: development of treatment plan with patient and/or surrogate as well as nursing, discussions  with consultants, evaluation of patient's response to treatment, examination of patient, obtaining history from patient or surrogate, ordering and performing treatments and interventions, ordering and review of laboratory studies, ordering and review of radiographic studies, pulse oximetry and re-evaluation of patient's condition.  Medications Ordered in ED Medications  albuterol (PROVENTIL) (2.5 MG/3ML) 0.083% nebulizer solution 5 mg (has no administration in time range)     Initial Impression / Assessment and Plan / ED Course  I have reviewed the triage vital signs and the nursing notes.  Pertinent labs & imaging results that were available during my care of the patient were reviewed by me and considered in my medical decision making (see chart for details).  Sherlin Gloster was evaluated in the Emergency Department on 09/20/2018 for the symptoms described in the history of present illness. She was evaluated in the context of the global COVID-19 pandemic, which necessitated consideration that the patient might be at risk for infection with the SARS-CoV-2 virus that causes COVID-19. Institutional protocols and algorithms that pertain to the evaluation of patients at risk for COVID-19 are in a state of rapid change based on information released by regulatory bodies including the CDC and federal and state organizations. These policies and algorithms were followed during the patient's care in the ED.         Patient to ED after fall secondary to weakness, found by EMS to by severely hypoxic. Patient has no complaints.  She arrives on NRB in the upper 90's, immediately desaturating to 79% when O2 by [place] at 6L was attempted. She does not have labored breathing. She continues to deny pain.   CXR show bilateral airspace opacities - atypical infectious process vs pulmonary edema. Given no fever,  normal WBC count, no cough, doubt pneumonia. COVID considered. Test in ED is negative.   Hgb found to be 6.6,  baseline appears to be 7 - 8. She appears otherwise hemodynamically stable. Type and screen ordered. Will hold on transfusion for now. Patient with a history of CKD, baseline Cr appears to be 1.5/1.8, now with AKI Cr 3.01.  DDx: pulmonary HTN, fluid overload (BNP >2000), multifocal PNA.   Discussed with Intensivist with plan that they will see her in the ED and assume care management.   On last check, the patient is sleeping well, on Bipap, initially at 30% with patient's O2 sat decreased to 85%. Reset to 45% with O2 sat improvement. No significant change in air movement on lung exam.     Final Clinical Impressions(s) / ED Diagnoses   Final diagnoses:  None   1. Respiratory failure 2. AKI 3. Profound anemia 4. Fluid overload  ED Discharge Orders    None       Charlann Lange, Hershal Coria 09/14/2018 [id number]    Mesner, Corene Cornea, MD 09/30/18 [phone]

## 2018-09-29 NOTE — ED Notes (Addendum)
Critical value Hemoglobin 6.6.Marland KitchenMarland Kitchen Providers aware

## 2018-09-29 NOTE — Progress Notes (Signed)
CRITICAL VALUE ALERT  Critical Value: Troponin (High Sensitivity) 423  Date & Time Notied:  09/30/2018 @ 7253  Provider Notified: Dr. Rodman Pickle  Orders Received/Actions taken: She notified Cardiology. No further orders received

## 2018-09-29 NOTE — Progress Notes (Addendum)
Bilateral lower extremity venous duplex completed. Preliminary results in Chart review CV Proc. [facility], [place] 09/30/2018, 12:23 PM

## 2018-09-29 NOTE — ED Notes (Signed)
ED TO INPATIENT HANDOFF REPORT  ED Nurse Name and Phone #:  [phone]  S Name/Age/Gender [name] [name] 85 y.o. female Room/Bed: 022C/022C  Code Status   Code Status: Prior  Home/SNF/Other Home Patient oriented to: self, place, time and situation Is this baseline? Yes   Triage Complete: Triage complete  Chief Complaint Trouble Breathing   Triage Note Pt brought to ED by [facility] from home c/o SOB and nausea, on EMS arrival pt's SPO2 58% on RA, place on NRM by [facility] and SPO2 up to 100% no more c/o nausea, Pt AO x 4 no distress noticed on the NRM. VS BP 139/80. R22, HR 81, SPO2 100% NRM. Temp 97.6 tympanic.    Allergies Allergies  Allergen Reactions  . Codeine Sulfate Other (See Comments)    REACTION: GI upset  . Sulfonamide Derivatives Other (See Comments)    REACTION: GI upset  . Vancomycin Other (See Comments)    Red man syndrome  . Atarax [Hydroxyzine] Nausea Only and Rash    Level of Care/Admitting Diagnosis ED Disposition    ED Disposition Condition Comment   Admit  Hospital Area: [facility] [[id number]]  Level of Care: ICU [6]  Covid Evaluation: Confirmed COVID Negative  Diagnosis: Acute on chronic respiratory failure with hypoxemia [facility]) [[id number]]  Admitting Physician: Mancel Parsons [[id number]]  Attending Physician: Mancel Parsons [[id number]]  Estimated length of stay: 3 - 4 days  Certification:: I certify this patient will need inpatient services for at least 2 midnights  PT Class (Do Not Modify): Inpatient [101]  PT Acc Code (Do Not Modify): Private [1]       B Medical/Surgery History Past Medical History:  Diagnosis Date  . Arthritis    r tkr  . Breast cancer (HCC)   . Chronic atrial fibrillation    CHADS2-VASc=6.  on low dose Eliquis (corrected for age & renal fxn)  . CVA (cerebral vascular accident) (HCC) 2008   mini stroke.no residual  . DEPRESSION 05/16/2009  . Diabetes mellitus type II   . Heart murmur    stress test 2009.dr peter  [place]  . HYPERLIPIDEMIA 07/24/2008  . HYPERTENSION 07/24/2008  . HYPOTHYROIDISM 07/24/2008  . Intermittent vertigo   . Metastatic carcinoid tumor to intra-abdominal site (HCC)   . Pneumonia   . Pulmonary hypertension (HCC)    Mod PHTN on Echo 03/2016: 2/2 combination of Wegener's, HFPF & Afib.  . Skin abnormality    facial lesions .pt applying mupiracin to areas  . Vertigo   . 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 BREAST SURGERY  2007   Lumpectomy, XRT 2006.l breast  . CHOLECYSTECTOMY  1980  . EXPLORATORY LAPAROTOMY    . HEEL SPUR SURGERY Right   . JOINT REPLACEMENT     r knee  . KNEE SURGERY  2009   TKR  . TEE WITHOUT CARDIOVERSION N/A 09/16/2017   Procedure: TRANSESOPHAGEAL ECHOCARDIOGRAM (TEE);  Surgeon: Laurey Morale, MD;  [facility];  Service: Cardiovascular;  Laterality: N/A;  . TRANSTHORACIC ECHOCARDIOGRAM  03/2016   Afib (no Diastolic Fxn). EF 55-60%. No RWMA. Mild Aortic dilation Mild MR. Severe LA dilation. Mod RA dilation.  Mod TR with Mod-Severe Pulmonary HTN - but normal RV function..  . VENTRAL HERNIA REPAIR N/A 07/01/2016   Procedure: REPAIR OF INCARCERATED INCISIONAL HERNIA ;  Surgeon: Almond Lint, MD;  Location: [facility] ORS;  Service: General;  Laterality: N/A;  . VIDEO  ASSISTED THORACOSCOPY  04/22/2011   Procedure: VIDEO ASSISTED THORACOSCOPY;  Surgeon: Loreli Slot, MD;  Location: [facility] OR;  Service: Thoracic;  Laterality: Left;  WITH BIOPSY     A IV Location/Drains/Wounds Patient Lines/Drains/Airways Status   Active Line/Drains/Airways    Name:   Placement date:   Placement time:   Site:   Days:   Peripheral IV 09-30-18 Right Antecubital   September 30, 2018    0309    Antecubital   less than 1   Urethral Catheter [place] Viereck RN Straight-tip 16 Fr.   09/30/2018    0536    Straight-tip   less than 1          Intake/Output Last 24 hours  Intake/Output Summary (Last 24 hours) at 09-30-18 3474 Last  data filed at 09-30-2018 2595 Gross per 24 hour  Intake -  Output 200 ml  Net -200 ml    Labs/Imaging Results for orders placed or performed during the hospital encounter of 09/30/2018 (from the past 48 hour(s))  CBC with Differential     Status: Abnormal   Collection Time: September 30, 2018  1:45 AM  Result Value Ref Range   WBC 10.2 4.0 - 10.5 K/uL   RBC 2.11 (L) 3.87 - 5.11 MIL/uL   Hemoglobin 6.6 (LL) 12.0 - 15.0 g/dL    Comment: REPEATED TO VERIFY THIS CRITICAL RESULT HAS VERIFIED AND BEEN CALLED TO M. Iley Deignan,RN BY TERRAN TYSOR ON 06 24 2020 AT 0230, AND HAS BEEN READ BACK.     HCT 21.9 (L) 36.0 - 46.0 %   MCV 103.8 (H) 80.0 - 100.0 fL   MCH 31.3 26.0 - 34.0 pg   MCHC 30.1 30.0 - 36.0 g/dL   RDW 63.8 (H) 75.6 - 43.3 %   Platelets 399 150 - 400 K/uL   nRBC 1.0 (H) 0.0 - 0.2 %   Neutrophils Relative % 82 %   Neutro Abs 8.3 (H) 1.7 - 7.7 K/uL   Lymphocytes Relative 6 %   Lymphs Abs 0.6 (L) 0.7 - 4.0 K/uL   Monocytes Relative 9 %   Monocytes Absolute 0.9 0.1 - 1.0 K/uL   Eosinophils Relative 0 %   Eosinophils Absolute 0.0 0.0 - 0.5 K/uL   Basophils Relative 0 %   Basophils Absolute 0.0 0.0 - 0.1 K/uL   Immature Granulocytes 3 %   Abs Immature Granulocytes 0.29 (H) 0.00 - 0.07 K/uL    Comment: Performed at [facility], [address], [place], [place] [address]  Comprehensive metabolic panel     Status: Abnormal   Collection Time: 09/30/18  1:45 AM  Result Value Ref Range   Sodium 132 (L) 135 - 145 mmol/L   Potassium 4.4 3.5 - 5.1 mmol/L   Chloride 100 98 - 111 mmol/L   CO2 20 (L) 22 - 32 mmol/L   Glucose, Bld 204 (H) 70 - 99 mg/dL   BUN 42 (H) 8 - 23 mg/dL   Creatinine, Ser 8.41 (H) 0.44 - 1.00 mg/dL   Calcium 8.3 (L) 8.9 - 10.3 mg/dL   Total Protein 6.9 6.5 - 8.1 g/dL   Albumin 2.9 (L) 3.5 - 5.0 g/dL   AST 17 15 - 41 U/L   ALT 8 0 - 44 U/L   Alkaline Phosphatase 71 38 - 126 U/L   Total Bilirubin 1.2 0.3 - 1.2 mg/dL   GFR calc non Af Amer 14 (L) >60 mL/min   GFR  calc Af Amer 16 (L) >60 mL/min  Anion gap 12 5 - 15    Comment: Performed at [facility], [address], [place], [place] [address]  Troponin I (High Sensitivity)     Status: None   Collection Time: 09/07/2018  1:45 AM  Result Value Ref Range   Troponin I (High Sensitivity) 16 <18 ng/L    Comment: (NOTE) Elevated high sensitivity troponin I (hsTnI) values and significant  changes across serial measurements may suggest ACS but many other  chronic and acute conditions are known to elevate hsTnI results.  Refer to the "Links" section for chest pain algorithms and additional  guidance. Performed at [facility], [address], [place], [place] [address]   Brain natriuretic peptide     Status: Abnormal   Collection Time: 10/04/2018  1:45 AM  Result Value Ref Range   B Natriuretic Peptide 2,059.3 (H) 0.0 - 100.0 pg/mL    Comment: Performed at [facility], [address], [place], [place] [address]  SARS Coronavirus 2 (CEPHEID- Performed in [facility] hospital lab), Hosp Order     Status: None   Collection Time: 09/30/2018  2:33 AM   Specimen: Nasopharyngeal Swab  Result Value Ref Range   SARS Coronavirus 2 NEGATIVE NEGATIVE    Comment: (NOTE) If result is NEGATIVE SARS-CoV-2 target nucleic acids are NOT DETECTED. The SARS-CoV-2 RNA is generally detectable in upper and lower  respiratory specimens during the acute phase of infection. The lowest  concentration of SARS-CoV-2 viral copies this assay can detect is 250  copies / mL. A negative result does not preclude SARS-CoV-2 infection  and should not be used as the sole basis for treatment or other  patient management decisions.  A negative result may occur with  improper specimen collection / handling, submission of specimen other  than nasopharyngeal swab, presence of viral mutation(s) within the  areas targeted by this assay, and inadequate number of viral copies  (<250 copies / mL). A negative result must be  combined with clinical  observations, patient history, and epidemiological information. If result is POSITIVE SARS-CoV-2 target nucleic acids are DETECTED. The SARS-CoV-2 RNA is generally detectable in upper and lower  respiratory specimens dur ing the acute phase of infection.  Positive  results are indicative of active infection with SARS-CoV-2.  Clinical  correlation with patient history and other diagnostic information is  necessary to determine patient infection status.  Positive results do  not rule out bacterial infection or co-infection with other viruses. If result is PRESUMPTIVE POSTIVE SARS-CoV-2 nucleic acids MAY BE PRESENT.   A presumptive positive result was obtained on the submitted specimen  and confirmed on repeat testing.  While 2019 novel coronavirus  (SARS-CoV-2) nucleic acids may be present in the submitted sample  additional confirmatory testing may be necessary for epidemiological  and / or clinical management purposes  to differentiate between  SARS-CoV-2 and other Sarbecovirus currently known to infect humans.  If clinically indicated additional testing with an alternate test  methodology [phone]) is advised. The SARS-CoV-2 RNA is generally  detectable in upper and lower respiratory sp ecimens during the acute  phase of infection. The expected result is Negative. Fact Sheet for Patients:  BoilerBrush.com.cy Fact Sheet for Healthcare Providers: https://pope.com/ This test is not yet approved or cleared by the [place] FDA and has been authorized for detection and/or diagnosis of SARS-CoV-2 by FDA under an Emergency Use Authorization (EUA).  This EUA will remain in effect (meaning this test can be  used) for the duration of the COVID-19 declaration under Section 564(b)(1) of the Act, 21 U.S.C. section 360bbb-3(b)(1), unless the authorization is terminated or revoked sooner. Performed at [facility]  Lab, [address] [place], [place] [address]   Type and screen [facility]     Status: None   Collection Time: 09/10/2018  3:04 AM  Result Value Ref Range   ABO/RH(D) A POS    Antibody Screen NEG    Sample Expiration      10/02/2018,2359 Performed at [facility], [address] [place], [place] [address]   Troponin I (High Sensitivity)     Status: Abnormal   Collection Time: 09/21/2018  3:26 AM  Result Value Ref Range   Troponin I (High Sensitivity) 21 (H) <18 ng/L    Comment: (NOTE) Elevated high sensitivity troponin I (hsTnI) values and significant  changes across serial measurements may suggest ACS but many other  chronic and acute conditions are known to elevate hsTnI results.  Refer to the "Links" section for chest pain algorithms and additional  guidance. Performed at [facility], [address], [place], [place] [address]   Urinalysis, Routine w reflex microscopic     Status: Abnormal   Collection Time: 09/24/2018  5:39 AM  Result Value Ref Range   Color, Urine YELLOW YELLOW   APPearance HAZY (A) CLEAR   Specific Gravity, Urine 1.013 1.005 - 1.030   pH 5.0 5.0 - 8.0   Glucose, UA NEGATIVE NEGATIVE mg/dL   Hgb urine dipstick MODERATE (A) NEGATIVE   Bilirubin Urine NEGATIVE NEGATIVE   Ketones, ur NEGATIVE NEGATIVE mg/dL   Protein, ur 914 (A) NEGATIVE mg/dL   Nitrite NEGATIVE NEGATIVE   Leukocytes,Ua NEGATIVE NEGATIVE   RBC / HPF 11-20 0 - 5 RBC/hpf   WBC, UA 0-5 0 - 5 WBC/hpf   Bacteria, UA FEW (A) NONE SEEN   Squamous Epithelial / LPF 0-5 0 - 5   Mucus PRESENT    Hyaline Casts, UA PRESENT     Comment: Performed at [facility], [address] [place], [place] [address]   Dg Chest Portable 1 View  Result Date: 09/11/2018 CLINICAL DATA:  Hypoxia.  Syncope. EXAM: PORTABLE CHEST 1 VIEW COMPARISON:  04/09/2018 FINDINGS: Heart size is enlarged. There are multifocal airspace opacities bilaterally. There are prominent interstitial lung  markings. There may be small bilateral pleural effusions. There is no acute osseous abnormality. No pneumothorax. IMPRESSION: 1. Diffuse bilateral airspace opacities may be secondary to an atypical infectious process such as viral pneumonia or developing pulmonary edema. 2. Persistent cardiomegaly. Electronically Signed   By: Katherine Mantle M.D.   On: 09/28/2018 02:05    Pending Labs Unresulted Labs (From admission, onward)    Start     Ordered   09/12/2018 0600  Occult blood card to lab, stool  Once,   STAT     09/19/2018 0559   Signed and Held  Lactic acid, plasma  STAT Now then every 3 hours,   R     Signed and Held   Signed and Held  Procalcitonin  Once,   R     Signed and Held   Signed and Held  Cortisol  ONCE - STAT,   STAT     Signed and Held   Signed and Held  CBC  Once,   R     Signed and Held   Signed and Held  Type and screen If  need to transfuse blood products please use the blood administration order set  Once,   R    Comments: If need to transfuse blood products please use the blood administration order set    Signed and Held   Signed and Held  Comprehensive metabolic panel  Once,   R     Signed and Held   Medical illustrator and Held  Magnesium  Once,   R     Signed and Held   Signed and Held  Phosphorus  Once,   R     Signed and Medical illustrator and Held  Culture, respiratory (tracheal aspirate)  Once,   R     Signed and Held   Medical illustrator and Held  Strep pneumoniae urinary antigen  (not at [employer])  Once,   R     Signed and Held   Signed and Held  Legionella Pneumophila Serogp 1 Ur Ag  Once,   R     Signed and Held   Medical illustrator and Held  Blood gas, arterial  Once,   R     Signed and Held   Medical illustrator and Held  CBC  Tomorrow morning,   R     Signed and Held   Medical illustrator and Armed forces training and education officer morning,   R     Signed and Held   Medical illustrator and Held  Magnesium  Tomorrow morning,   R     Signed and Held   Signed and Held  Phosphorus  Tomorrow morning,   R     Signed and Held   Signed  and Held  Troponin I (High Sensitivity)  Now then every 6 hours,   R    Question:  Indication  Answer:  Suspect ACS   Signed and Held          Vitals/Pain Today's Vitals   10-29-18 0445 2018-10-29 0500 10-29-18 0515 10/29/18 0530  BP: 118/73 119/70 126/67 118/77  Pulse: 71 72 79 61  Resp: 14 (!) 30 (!) 25 (!) 26  Temp:      TempSrc:      SpO2: 95% 91% 95% 100%  PainSc:        Isolation Precautions No active isolations  Medications Medications  albuterol (PROVENTIL) (2.5 MG/3ML) 0.083% nebulizer solution 5 mg (5 mg Nebulization Given October 29, 2018 0200)  furosemide (LASIX) injection 20 mg (20 mg Intravenous Given 10-29-2018 0425)  furosemide (LASIX) injection 60 mg (60 mg Intravenous Given Oct 29, 2018 0454)    Mobility walks Low fall risk   Focused Assessments Cardiac Assessment Handoff:  Cardiac Rhythm: Atrial fibrillation Lab Results  Component Value Date   CKTOTAL 90 11/01/2012   TROPONINI <0.03 04/05/2014   Lab Results  Component Value Date   DDIMER 1.33 (H) 09/25/2014   Does the Patient currently have chest pain? No     R Recommendations: See Admitting Provider Note  Report given to:   Additional Notes:

## 2018-09-29 NOTE — Telephone Encounter (Signed)
She is currently being admitted and would wait until after admission to see if needed .   Has taken for vertigo in past.

## 2018-09-30 ENCOUNTER — Inpatient Hospital Stay (HOSPITAL_COMMUNITY): Payer: Medicare Other

## 2018-09-30 DIAGNOSIS — N179 Acute kidney failure, unspecified: Secondary | ICD-10-CM

## 2018-09-30 DIAGNOSIS — M3131 Wegener's granulomatosis with renal involvement: Secondary | ICD-10-CM

## 2018-09-30 LAB — CBC
HCT: 23.6 % — ABNORMAL LOW (ref 36.0–46.0)
Hemoglobin: 7.4 g/dL — ABNORMAL LOW (ref 12.0–15.0)
MCH: 31.2 pg (ref 26.0–34.0)
MCHC: 31.4 g/dL (ref 30.0–36.0)
MCV: 99.6 fL (ref 80.0–100.0)
Platelets: 313 10*3/uL (ref 150–400)
RBC: 2.37 MIL/uL — ABNORMAL LOW (ref 3.87–5.11)
RDW: 18.3 % — ABNORMAL HIGH (ref 11.5–15.5)
WBC: 8.3 10*3/uL (ref 4.0–10.5)
nRBC: 1.2 % — ABNORMAL HIGH (ref 0.0–0.2)

## 2018-09-30 LAB — TYPE AND SCREEN
ABO/RH(D): A POS
Antibody Screen: NEGATIVE
Unit division: 0

## 2018-09-30 LAB — BASIC METABOLIC PANEL
Anion gap: 9 (ref 5–15)
BUN: 39 mg/dL — ABNORMAL HIGH (ref 8–23)
CO2: 24 mmol/L (ref 22–32)
Calcium: 8 mg/dL — ABNORMAL LOW (ref 8.9–10.3)
Chloride: 101 mmol/L (ref 98–111)
Creatinine, Ser: 2.96 mg/dL — ABNORMAL HIGH (ref 0.44–1.00)
GFR calc Af Amer: 16 mL/min — ABNORMAL LOW (ref >60)
GFR calc non Af Amer: 14 mL/min — ABNORMAL LOW (ref >60)
Glucose, Bld: 102 mg/dL — ABNORMAL HIGH (ref 70–99)
Potassium: 4.4 mmol/L (ref 3.5–5.1)
Sodium: 134 mmol/L — ABNORMAL LOW (ref 135–145)

## 2018-09-30 LAB — LEGIONELLA PNEUMOPHILA SEROGP 1 UR AG: L. pneumophila Serogp 1 Ur Ag: NEGATIVE

## 2018-09-30 LAB — BPAM RBC
Blood Product Expiration Date: 202007122359
ISSUE DATE / TIME: 202006241149
Unit Type and Rh: 6200

## 2018-09-30 LAB — PHOSPHORUS: Phosphorus: 4.4 mg/dL (ref 2.5–4.6)

## 2018-09-30 LAB — MAGNESIUM: Magnesium: 2.4 mg/dL (ref 1.7–2.4)

## 2018-09-30 LAB — TROPONIN I (HIGH SENSITIVITY): Troponin I (High Sensitivity): 99 ng/L — ABNORMAL HIGH (ref <18)

## 2018-09-30 MED ORDER — FUROSEMIDE 10 MG/ML IJ SOLN
60.0000 mg | Freq: Once | INTRAMUSCULAR | Status: AC
  Administered 2018-09-30: 60 mg via INTRAVENOUS
  Filled 2018-09-30: qty 6

## 2018-09-30 MED ORDER — IPRATROPIUM-ALBUTEROL 0.5-2.5 (3) MG/3ML IN SOLN
3.0000 mL | Freq: Three times a day (TID) | RESPIRATORY_TRACT | Status: DC
  Administered 2018-09-30 – 2018-10-05 (×19): 3 mL via RESPIRATORY_TRACT
  Filled 2018-09-30 (×19): qty 3

## 2018-09-30 MED ORDER — SODIUM CHLORIDE 0.9 % IV SOLN
1000.0000 mg | INTRAVENOUS | Status: DC
  Administered 2018-09-30: 1000 mg via INTRAVENOUS
  Filled 2018-09-30 (×2): qty 8

## 2018-09-30 NOTE — Progress Notes (Signed)
Name: [name] [name] MRN: [id number] DOB: 01-17-1933 Date of service : 10/05/2018 Cc: SOB   LOS: 1  [facility] / Critical Care Note   History of Present Illness: 85 yo female with hx of HTN, pulm HTH, chronic resp failure on home oxygen, hx of wegeners, afib on eliquis, hx of colon cancer who presented from home today with worsening shortness of breath for a day. Her husband called EMS , she arrived in the ED with sats in the 50's , improved with NRB and then placed on BIPAP. During my evaluation the patient is on BIPAP at 40%, sats are normal she is awake and able to say some words, says she feels much better.  ROS+ N/V , no fevers or chills , no sputum production, denies any chest pain.   Lines / Drains: PIV   Cultures:     Antibiotics: \ 6/24azithromycin>>   Tests / Events: COVID19 negative 09/23/2018 2D echo reveals 60% ejection fraction with significant left atrial dilatation.   Vital Signs: Temp:  [96.7 F (35.9 C)-98.5 F (36.9 C)] 98.1 F (36.7 C) (06/25 0800) Pulse Rate:  [59-88] 72 (06/25 0800) Resp:  [12-29] 19 (06/25 0800) BP: (99-135)/(55-83) 122/67 (06/25 0800) SpO2:  [89 %-100 %] 98 % (06/25 0800) FiO2 (%):  [45 %] 45 % (06/25 0756) Weight:  [64.9 kg-66 kg] 64.9 kg (06/25 0500) I/O last 3 completed shifts: In: 616.2 [I.V.:315; IV Piggyback:301.2] Out: 1440 [Urine:1440]  Physical Examination: General: Elderly female who is requesting water to drink HEENT: No JVD or lymphadenopathy is appreciated Neuro: Awake alert follows commands asks appropriate questions CV: Sounds are irregular with controlled ventricular rhythm adequate blood pressure PULM: Decreased breath sounds throughout WG:NFAO, non-tender, bsx4 active  Extremities: warm/dry, 1+ edema  Skin: no rashes or lesions    Ventilator settings: FiO2 (%):  [45 %] 45 %  Labs    CBC Recent Labs  Lab 10/05/2018 0918 09/17/2018 1715 09/24/2018 1836 09/30/18 0353  HGB 6.3* 7.6* 9.5* 7.4*  HCT  19.3* 23.7* 28.0* 23.6*  WBC 11.4* 8.5  --  8.3  PLT 351 335  --  313     BMET Recent Labs  Lab 10/02/2018 0145 09/07/2018 0841 10/05/2018 0918 09/26/2018 1715 09/07/2018 1836 09/30/18 0353  NA 132* 133* 134*  --  135 134*  K 4.4 4.4 4.3  --  4.2 4.4  CL 100  --  101  --   --  101  CO2 20*  --  22  --   --  24  GLUCOSE 204*  --  152*  --   --  102*  BUN 42*  --  42*  --   --  39*  CREATININE 3.01*  --  3.01*  --   --  2.96*  CALCIUM 8.3*  --  8.2*  --   --  8.0*  MG  --   --  QUANTITY NOT SUFFICIENT, UNABLE TO PERFORM TEST 2.5*  --  2.4  PHOS  --   --  4.5  --   --  4.4    No results for input(s): INR in the last 168 hours.  Recent Labs  Lab 09/19/2018 0841 10/01/2018 1836  PHART 7.290* 7.292*  PCO2ART 51.1* 52.0*  PO2ART 100.0 85.0  HCO3 24.8 25.4  TCO2 26 27  O2SAT 97.0 96.0     Dg Chest Port 1 View  Result Date: 09/30/2018 CLINICAL DATA:  Respiratory failure. EXAM: PORTABLE CHEST 1 VIEW COMPARISON:  Radiograph of  September 29, 2018. FINDINGS: Stable cardiomegaly. No pneumothorax is noted. Stable bilateral lung opacities are noted concerning for pneumonia or edema. No pneumothorax or significant pleural effusion is noted. Bony thorax is unremarkable. IMPRESSION: Stable bilateral lung opacities are noted concerning for pneumonia or edema. Electronically Signed   By: Marijo Conception M.D.   On: 09/30/2018 07:48   Dg Chest Portable 1 View  Result Date: 09/10/2018 CLINICAL DATA:  Hypoxia.  Syncope. EXAM: PORTABLE CHEST 1 VIEW COMPARISON:  04/09/2018 FINDINGS: Heart size is enlarged. There are multifocal airspace opacities bilaterally. There are prominent interstitial lung markings. There may be small bilateral pleural effusions. There is no acute osseous abnormality. No pneumothorax. IMPRESSION: 1. Diffuse bilateral airspace opacities may be secondary to an atypical infectious process such as viral pneumonia or developing pulmonary edema. 2. Persistent cardiomegaly. Electronically Signed    By: Constance Holster M.D.   On: 09/12/2018 02:05   Vas [place] Lower Extremity Venous (dvt)  Result Date: 09/06/2018  Lower Venous Study Indications: Swelling, and SOB.  Risk Factors: Cancer H/O colon ccancer Hypertension, Pulmaonry HTH, Chronic respiratory failure, H/O Wegeners, A fib on eliquis. Limitations: Respiratory interference due to patient being on BiPAP. Comparison Study: Only comparison available as completed on the right leg                   01/01/07 which was negative. Performing Technologist: Toma Copier RVS  Examination Guidelines: A complete evaluation includes B-mode imaging, spectral Doppler, color Doppler, and power Doppler as needed of all accessible portions of each vessel. Bilateral testing is considered an integral part of a complete examination. Limited examinations for reoccurring indications may be performed as noted.  +---------+---------------+---------+-----------+----------+------------------+ RIGHT    CompressibilityPhasicitySpontaneityPropertiesSummary            +---------+---------------+---------+-----------+----------+------------------+ CFV      Full           Yes      Yes                                     +---------+---------------+---------+-----------+----------+------------------+ SFJ      Full                                                            +---------+---------------+---------+-----------+----------+------------------+ FV Prox  Full           Yes      Yes                                     +---------+---------------+---------+-----------+----------+------------------+ FV Mid   Full                                                            +---------+---------------+---------+-----------+----------+------------------+ FV DistalFull           Yes      Yes                                     +---------+---------------+---------+-----------+----------+------------------+  PFV      Full           Yes      Yes                                      +---------+---------------+---------+-----------+----------+------------------+ POP      Full           Yes      Yes                                     +---------+---------------+---------+-----------+----------+------------------+ PTV      Full                                                            +---------+---------------+---------+-----------+----------+------------------+ PERO     Full                                         Difficult to image +---------+---------------+---------+-----------+----------+------------------+   +---------+---------------+---------+-----------+----------+-------------------+ LEFT     CompressibilityPhasicitySpontaneityPropertiesSummary             +---------+---------------+---------+-----------+----------+-------------------+ CFV      Partial                                                          +---------+---------------+---------+-----------+----------+-------------------+ SFJ      Full                                                             +---------+---------------+---------+-----------+----------+-------------------+ FV Prox  Full                                                             +---------+---------------+---------+-----------+----------+-------------------+ FV Mid   Full                                                             +---------+---------------+---------+-----------+----------+-------------------+ FV DistalFull                                                             +---------+---------------+---------+-----------+----------+-------------------+ PFV      Full                                                             +---------+---------------+---------+-----------+----------+-------------------+  POP      Full                                                              +---------+---------------+---------+-----------+----------+-------------------+ PTV      Full                                                             +---------+---------------+---------+-----------+----------+-------------------+ PERO                                                  Unable to visualize                                                       well enough to                                                            evaluate            +---------+---------------+---------+-----------+----------+-------------------+   Left Technical Findings: Not visualized segments include peroneal. Unable to image well enough to evaluate.   Summary: Right: There is no evidence of deep vein thrombosis in the lower extremity. No cystic structure found in the popliteal fossa. Left: There is no evidence of deep vein thrombosis in the lower extremity. However, portions of this examination were limited- see technologist comments above. No cystic structure found in the popliteal fossa.  *See table(s) above for measurements and observations. Electronically signed by Harold Barban MD on 09/14/2018 at 2:18:59 PM.    Final        Assessment and Plan: Acute on chronic hypercarbic respiratory failure in the setting of chronic home O2 use elevated BNP complicated by elevated creatinine.  Questionable component of infectious process. On chronic home O2 we will try discontinuing BiPAP at this time. Gentle diuresis.  With goal negative intake and output Note she is on home O2 at 5 L to 6 L nasal cannula. Pulmonary toilet Empirical antimicrobial therapy with Zithromax Lower extremity Doppler study 09/28/2018- for DVT CT angios not option with renal failure Doubt pulmonary embolism in the setting of chronic anticoagulation  History of congestive heart failure with elevated BNP Elevated troponins History of atrial fibrillation Cardiology consult appreciated Hold anticoagulation at this  time due to anemia Continue to monitor troponin 09/30/2018 2D echo results are noted with a EF of 60%.  Left atrial severely dilated.  Inferior vena cava was dilated patient in atrial fibrillation.   Acute kidney injury in the setting of chronic kidney disease 3 Lab Results  Component Value Date   CREATININE 2.96 (H) 09/30/2018  CREATININE 3.01 (H) 09/06/2018   CREATININE 3.01 (H) 10/02/2018   CREATININE 1.61 (H) 10/07/2016   CREATININE 1.8 (H) 02/08/2013   CREATININE 1.8 (H) 12/08/2012   CREATININE 1.8 (H) 12/01/2012   Recent Labs  Lab 09/11/2018 0918 09/10/2018 1836 09/30/18 0353  K 4.3 4.2 4.4   Gentle diuresis Avoid nephrotoxins No need for renal consult at this time  Anemia in the setting of chronic anticoagulation for atrial fibrillation Hold anticoagulation Transfuse 1 unit of packed cells 09/25/2018 When transfusions should received Lasix to offset her congestive heart failure exacerbation for blood transfusion  Full CODE STATUS DVT prophylaxis with sequential hose GI prophylaxis with Pepcid Diet currently n.p.o. will advance to heart healthy diet as tolerated Patient updated at the bedside 09/30/2018   [facility] Minor ACNP Maryanna Shape PCCM Pager [phone] till 1 pm If no answer page [phone]- [phone] 09/30/2018, 10:22 AM

## 2018-09-30 NOTE — Progress Notes (Signed)
Progress Note  Patient Name: [name] [name] Date of Encounter: 09/30/2018  Primary Cardiologist: Peter Martinique, MD   Subjective   Patient feels clinically improved although remains on BiPAP  Inpatient Medications    Scheduled Meds: . Chlorhexidine Gluconate Cloth  6 each Topical Q0600  . ipratropium-albuterol  3 mL Nebulization TID  . levothyroxine  100 mcg Oral QAC breakfast   Continuous Infusions: . azithromycin 500 mg (09/30/18 1044)  . famotidine (PEPCID) IV 20 mg (09/30/18 0952)   PRN Meds: acetaminophen, ondansetron (ZOFRAN) IV   Vital Signs    Vitals:   09/30/18 1000 09/30/18 1100 09/30/18 1118 09/30/18 1119  BP: 129/85 109/63 109/63   Pulse: 71 72 76   Resp: _0 Temp:    (!) 97.3 F (36.3 C)  TempSrc:    Axillary  SpO2: 93% 100% 100%   Weight:      Height:        Intake/Output Summary (Last 24 hours) at 09/30/2018 1145 Last data filed at 09/30/2018 1104 Gross per 24 hour  Intake 616.15 ml  Output 1265 ml  Net -648.85 ml   Last 3 Weights 09/30/2018 10/04/2018 08/19/2018  Weight (lbs) 143 lb 1.3 oz 145 lb 8.1 oz 143 lb  Weight (kg) 64.9 kg 66 kg 64.864 kg      Telemetry    Atrial fibrillation with controlled ventricular response- Personally Reviewed  ECG    None today- Personally Reviewed  Physical Exam   GEN: No acute distress.   Neck: No JVD Cardiac:  Irregularly irregular, no murmurs, rubs, or gallops.  Respiratory: Clear to auscultation bilaterally. GI: Soft, nontender, non-distended  MS: No edema; No deformity. Neuro:  Nonfocal  Psych: Normal affect   Labs    High Sensitivity Troponin:   Recent Labs  Lab 09/10/2018 0145 09/22/2018 0326 09/12/2018 0918 09/17/2018 1718 09/28/2018 1828  TROPONINIHS 16 21* 99* 423* 388*      Cardiac EnzymesNo results for input(s): TROPONINI in the last 168 hours. No results for input(s): TROPIPOC in the last 168 hours.   Chemistry Recent Labs  Lab 09/23/2018 0145  09/26/2018 0918 09/21/2018 1836  09/30/18 0353  NA 132*   < > 134* 135 134*  K 4.4   < > 4.3 4.2 4.4  CL 100  --  101  --  101  CO2 20*  --  22  --  24  GLUCOSE 204*  --  152*  --  102*  BUN 42*  --  42*  --  39*  CREATININE 3.01*  --  3.01*  --  2.96*  CALCIUM 8.3*  --  8.2*  --  8.0*  PROT 6.9  --  6.9  --   --   ALBUMIN 2.9*  --  2.9*  --   --   AST 17  --  17  --   --   ALT 8  --  8  --   --   ALKPHOS 71  --  61  --   --   BILITOT 1.2  --  1.3*  --   --   GFRNONAA 14*  --  14*  --  14*  GFRAA 16*  --  16*  --  16*  ANIONGAP 12  --  11  --  9   < > = values in this interval not displayed.     Hematology Recent Labs  Lab 09/06/2018 0918 09/26/2018 1715 09/22/2018 1836 09/30/18 0353  WBC  11.4* 8.5  --  8.3  RBC 1.97* 2.40*  --  2.37*  HGB 6.3* 7.6* 9.5* 7.4*  HCT 19.3* 23.7* 28.0* 23.6*  MCV 98.0 98.8  --  99.6  MCH 32.0 31.7  --  31.2  MCHC 32.6 32.1  --  31.4  RDW 16.4* 17.5*  --  18.3*  PLT 351 335  --  313    BNP Recent Labs  Lab 10/03/2018 0145  BNP 2,059.3*     DDimer No results for input(s): DDIMER in the last 168 hours.   Radiology    Dg Chest Port 1 View  Result Date: 09/30/2018 CLINICAL DATA:  Respiratory failure. EXAM: PORTABLE CHEST 1 VIEW COMPARISON:  Radiograph of September 29, 2018. FINDINGS: Stable cardiomegaly. No pneumothorax is noted. Stable bilateral lung opacities are noted concerning for pneumonia or edema. No pneumothorax or significant pleural effusion is noted. Bony thorax is unremarkable. IMPRESSION: Stable bilateral lung opacities are noted concerning for pneumonia or edema. Electronically Signed   By: Marijo Conception M.D.   On: 09/30/2018 07:48   Dg Chest Portable 1 View  Result Date: 09/13/2018 CLINICAL DATA:  Hypoxia.  Syncope. EXAM: PORTABLE CHEST 1 VIEW COMPARISON:  04/09/2018 FINDINGS: Heart size is enlarged. There are multifocal airspace opacities bilaterally. There are prominent interstitial lung markings. There may be small bilateral pleural effusions. There is no  acute osseous abnormality. No pneumothorax. IMPRESSION: 1. Diffuse bilateral airspace opacities may be secondary to an atypical infectious process such as viral pneumonia or developing pulmonary edema. 2. Persistent cardiomegaly. Electronically Signed   By: Constance Holster M.D.   On: 09/14/2018 02:05   Vas [place] Lower Extremity Venous (dvt)  Result Date: 09/28/2018  Lower Venous Study Indications: Swelling, and SOB.  Risk Factors: Cancer H/O colon ccancer Hypertension, Pulmaonry HTH, Chronic respiratory failure, H/O Wegeners, A fib on eliquis. Limitations: Respiratory interference due to patient being on BiPAP. Comparison Study: Only comparison available as completed on the right leg                   01/01/07 which was negative. Performing Technologist: Toma Copier RVS  Examination Guidelines: A complete evaluation includes B-mode imaging, spectral Doppler, color Doppler, and power Doppler as needed of all accessible portions of each vessel. Bilateral testing is considered an integral part of a complete examination. Limited examinations for reoccurring indications may be performed as noted.  +---------+---------------+---------+-----------+----------+------------------+ RIGHT    CompressibilityPhasicitySpontaneityPropertiesSummary            +---------+---------------+---------+-----------+----------+------------------+ CFV      Full           Yes      Yes                                     +---------+---------------+---------+-----------+----------+------------------+ SFJ      Full                                                            +---------+---------------+---------+-----------+----------+------------------+ FV Prox  Full           Yes      Yes                                     +---------+---------------+---------+-----------+----------+------------------+  FV Mid   Full                                                             +---------+---------------+---------+-----------+----------+------------------+ FV DistalFull           Yes      Yes                                     +---------+---------------+---------+-----------+----------+------------------+ PFV      Full           Yes      Yes                                     +---------+---------------+---------+-----------+----------+------------------+ POP      Full           Yes      Yes                                     +---------+---------------+---------+-----------+----------+------------------+ PTV      Full                                                            +---------+---------------+---------+-----------+----------+------------------+ PERO     Full                                         Difficult to image +---------+---------------+---------+-----------+----------+------------------+   +---------+---------------+---------+-----------+----------+-------------------+ LEFT     CompressibilityPhasicitySpontaneityPropertiesSummary             +---------+---------------+---------+-----------+----------+-------------------+ CFV      Partial                                                          +---------+---------------+---------+-----------+----------+-------------------+ SFJ      Full                                                             +---------+---------------+---------+-----------+----------+-------------------+ FV Prox  Full                                                             +---------+---------------+---------+-----------+----------+-------------------+ FV Mid   Full                                                             +---------+---------------+---------+-----------+----------+-------------------+  FV DistalFull                                                             +---------+---------------+---------+-----------+----------+-------------------+ PFV       Full                                                             +---------+---------------+---------+-----------+----------+-------------------+ POP      Full                                                             +---------+---------------+---------+-----------+----------+-------------------+ PTV      Full                                                             +---------+---------------+---------+-----------+----------+-------------------+ PERO                                                  Unable to visualize                                                       well enough to                                                            evaluate            +---------+---------------+---------+-----------+----------+-------------------+   Left Technical Findings: Not visualized segments include peroneal. Unable to image well enough to evaluate.   Summary: Right: There is no evidence of deep vein thrombosis in the lower extremity. No cystic structure found in the popliteal fossa. Left: There is no evidence of deep vein thrombosis in the lower extremity. However, portions of this examination were limited- see technologist comments above. No cystic structure found in the popliteal fossa.  *See table(s) above for measurements and observations. Electronically signed by Harold Barban MD on 10/05/2018 at 2:18:59 PM.    Final     Cardiac Studies   2D  echocardiogram (10/02/2018)  IMPRESSIONS    1. The left ventricle has normal systolic function, with an ejection fraction of 55-60%. The cavity size was normal. There is mildly increased left ventricular wall thickness. Left ventricular diastolic Doppler parameters are indeterminate. No evidence  of left ventricular regional wall motion abnormalities.  2. The right ventricle has normal systolic function. The cavity was normal. There is no increase in right ventricular wall thickness.  3. Left atrial size was  severely dilated.  4. Right atrial size was moderately dilated.  5. No evidence of mitral valve stenosis. Mild mitral regurgitation.  6. Tricuspid valve regurgitation is moderate-severe.  7. The aortic valve is tricuspid. Mild calcification of the aortic valve. Aortic valve regurgitation is mild by color flow Doppler. No stenosis of the aortic valve.  8. The aortic root is normal in size and structure.  9. There is mild dilatation of the ascending aorta measuring 41 mm. 10. The inferior vena cava was dilated in size with <50% respiratory variability. PA systolic pressure 68 mmHg. 11. The patient was in atrial fibrillation.  Patient Profile     [name] [name] is a 85 y.o. female with a hx of chronic diastolic HF, HTN, DM2, prior TIA, HL, breast CA, pulmonary HTN, chronic atrial fibrillation, chronic anticoagulation, chronic anemia, h/o epistaxis,dx of granulomatosis with polyangiitis (Wegener's)  who is being seen today for the evaluation of elevated troponin at the request of Dr. Claudie Leach, Critical Care Medicine.    Assessment & Plan    1: Hypoxic respiratory failure- probably multifactorial on BiPAP with good sats.  Her BNP is elevated.  She is diuresed 1.9 L.  She is on antibiotics for presumed pneumonia.  2: Chronic A. fib- rate controlled.  I do not think she is a candidate for oral anticoagulation given her frailty and anemia.  3: Diastolic heart failure- 2D echo revealed normal LV function.  She is diuresed 1.9 L.  She feels clinically improved.  Can probably transition from IV to p.o. Lasix tomorrow.  4: Renal insufficiency- serum creatinine is holding steady at around 3, above her baseline.  5: Positive troponins- troponins remain relatively low.  I do not think there is a component of ACS but rather demand ischemia.   [facility] will sign off.   Medication Recommendations: Sent home on low-dose oral diuretics Other recommendations (labs, testing, etc): None Follow up as an  outpatient: We will arrange follow-up with Dr. Martinique.  For questions or updates, please contact [place] Please consult [email or web address] for contact info under        Signed, Quay Burow, MD  09/30/2018, 11:45 AM

## 2018-09-30 NOTE — Progress Notes (Signed)
RT NOTE: RT called to bedside by RN to take patient to CT on bipap. RT arrived and noticed patient on NRB. Before RT arrived [place], RN took patient off bipap and placed patient on a NRB. Patient sating 100% on NRB and talking. Patient is not using any accessory muscles and does not show any signs of shortness of breath. Patient states that she is comfortable on NRB. RT will continue to monitor as needed.

## 2018-09-30 NOTE — Progress Notes (Signed)
PCCM Interval Note  Patient expressed wanting full code. Updated son. Updated code status in EMR.  Rodman Pickle, M.D. [facility] Pulmonary/Critical Care Medicine Pager: [phone] After hours pager: [phone]

## 2018-09-30 NOTE — Progress Notes (Signed)
Walked in patient's room to give breathing treatment, patient sats were in low 80s.   After greeting patient, patient began to converse and sats dropped again.  Asked patient to concentrate on her breathing, sats came up to about 87%, increased flow rate to 30L, sats came up to 93%.  Will continue to monitor patient.

## 2018-09-30 NOTE — Progress Notes (Signed)
RT NOTE: RT placed patient on Heated HFNC per MD. Patient says HFNC is comfortable and tolerating well sating 100% on 25L and 100% FiO2.

## 2018-09-30 NOTE — Progress Notes (Signed)
Assisted tele visit to patient with son.  Alver Sorrow, RN

## 2018-09-30 NOTE — ED Provider Notes (Signed)
Medical screening examination/treatment/procedure(s) were conducted as a shared visit with non-physician practitioner(s) and myself.  I personally evaluated the patient during the encounter.  Hypoxic resp failure with significant hypoxia likely related to pulmonary edema. After covid negative, started bipap. Also has anemia, but 2/2 fluid overload will need to be administered gingerly. Plan for ICU admission to ensure that patient doesn't get too overloadeed.   EKG Interpretation  Date/Time:  Wednesday September 29 2018 01:34:29 EDT Ventricular Rate:  73 PR Interval:    QRS Duration: 108 QT Interval:  433 QTC Calculation: 478 R Axis:   -5 Text Interpretation:  Atrial fibrillation Borderline T abnormalities, diffuse leads TWI in III/v6 appear new, no other changes Confirmed by Merrily Pew [phone]) on 09/23/2018 2:09:56 AM     Taiwan Talcott, Corene Cornea, MD 09/30/18 0530

## 2018-09-30 NOTE — Progress Notes (Signed)
Alert and oriented x 4. Transitioned from Bipap to non rebreather and now on high flow nasal canula. O2 SAT within normal limits (see flow sheet). Foley remains intact draining yellow/straw urine. Call bell within reach. Encouraged to report signs and symptoms to staff.

## 2018-10-01 ENCOUNTER — Inpatient Hospital Stay (HOSPITAL_COMMUNITY): Payer: Medicare Other

## 2018-10-01 LAB — CBC WITH DIFFERENTIAL/PLATELET
Abs Immature Granulocytes: 0.18 10*3/uL — ABNORMAL HIGH (ref 0.00–0.07)
Basophils Absolute: 0 10*3/uL (ref 0.0–0.1)
Basophils Relative: 0 %
Eosinophils Absolute: 0 10*3/uL (ref 0.0–0.5)
Eosinophils Relative: 0 %
HCT: 24.2 % — ABNORMAL LOW (ref 36.0–46.0)
Hemoglobin: 7.6 g/dL — ABNORMAL LOW (ref 12.0–15.0)
Immature Granulocytes: 3 %
Lymphocytes Relative: 3 %
Lymphs Abs: 0.2 10*3/uL — ABNORMAL LOW (ref 0.7–4.0)
MCH: 31.3 pg (ref 26.0–34.0)
MCHC: 31.4 g/dL (ref 30.0–36.0)
MCV: 99.6 fL (ref 80.0–100.0)
Monocytes Absolute: 0.1 10*3/uL (ref 0.1–1.0)
Monocytes Relative: 2 %
Neutro Abs: 6.8 10*3/uL (ref 1.7–7.7)
Neutrophils Relative %: 92 %
Platelets: 308 10*3/uL (ref 150–400)
RBC: 2.43 MIL/uL — ABNORMAL LOW (ref 3.87–5.11)
RDW: 17.2 % — ABNORMAL HIGH (ref 11.5–15.5)
WBC: 7.3 10*3/uL (ref 4.0–10.5)
nRBC: 0.7 % — ABNORMAL HIGH (ref 0.0–0.2)

## 2018-10-01 LAB — GLUCOSE, CAPILLARY
Glucose-Capillary: 339 mg/dL — ABNORMAL HIGH (ref 70–99)
Glucose-Capillary: 288 mg/dL — ABNORMAL HIGH (ref 70–99)

## 2018-10-01 LAB — BASIC METABOLIC PANEL
Anion gap: 10 (ref 5–15)
BUN: 44 mg/dL — ABNORMAL HIGH (ref 8–23)
CO2: 25 mmol/L (ref 22–32)
Calcium: 8 mg/dL — ABNORMAL LOW (ref 8.9–10.3)
Chloride: 96 mmol/L — ABNORMAL LOW (ref 98–111)
Creatinine, Ser: 3.1 mg/dL — ABNORMAL HIGH (ref 0.44–1.00)
GFR calc Af Amer: 15 mL/min — ABNORMAL LOW (ref >60)
GFR calc non Af Amer: 13 mL/min — ABNORMAL LOW (ref >60)
Glucose, Bld: 372 mg/dL — ABNORMAL HIGH (ref 70–99)
Potassium: 4.4 mmol/L (ref 3.5–5.1)
Sodium: 131 mmol/L — ABNORMAL LOW (ref 135–145)

## 2018-10-01 LAB — PHOSPHORUS: Phosphorus: 5.3 mg/dL — ABNORMAL HIGH (ref 2.5–4.6)

## 2018-10-01 LAB — MAGNESIUM: Magnesium: 2.4 mg/dL (ref 1.7–2.4)

## 2018-10-01 MED ORDER — FUROSEMIDE 20 MG PO TABS
20.0000 mg | ORAL_TABLET | Freq: Every day | ORAL | Status: DC
  Administered 2018-10-01 – 2018-10-02 (×2): 20 mg via ORAL
  Filled 2018-10-01 (×2): qty 1

## 2018-10-01 MED ORDER — NEBIVOLOL HCL 10 MG PO TABS
10.0000 mg | ORAL_TABLET | Freq: Every day | ORAL | Status: DC
  Administered 2018-10-01 – 2018-10-04 (×4): 10 mg via ORAL
  Filled 2018-10-01 (×6): qty 1

## 2018-10-01 MED ORDER — INSULIN GLARGINE 100 UNIT/ML ~~LOC~~ SOLN
5.0000 [IU] | Freq: Every day | SUBCUTANEOUS | Status: DC
  Administered 2018-10-01 – 2018-10-04 (×4): 5 [IU] via SUBCUTANEOUS
  Filled 2018-10-01 (×6): qty 0.05

## 2018-10-01 MED ORDER — APIXABAN 2.5 MG PO TABS
2.5000 mg | ORAL_TABLET | Freq: Two times a day (BID) | ORAL | Status: DC
  Administered 2018-10-01 – 2018-10-04 (×8): 2.5 mg via ORAL
  Filled 2018-10-01 (×8): qty 1

## 2018-10-01 MED ORDER — ATOVAQUONE 750 MG/5ML PO SUSP
1500.0000 mg | Freq: Every day | ORAL | Status: DC
  Administered 2018-10-01 – 2018-10-02 (×2): 1500 mg via ORAL
  Filled 2018-10-01 (×2): qty 10

## 2018-10-01 MED ORDER — INSULIN ASPART 100 UNIT/ML ~~LOC~~ SOLN
0.0000 [IU] | Freq: Three times a day (TID) | SUBCUTANEOUS | Status: DC
  Administered 2018-10-01: 11 [IU] via SUBCUTANEOUS
  Administered 2018-10-02: 3 [IU] via SUBCUTANEOUS
  Administered 2018-10-02 (×2): 5 [IU] via SUBCUTANEOUS
  Administered 2018-10-03: 3 [IU] via SUBCUTANEOUS
  Administered 2018-10-03 – 2018-10-04 (×3): 5 [IU] via SUBCUTANEOUS
  Administered 2018-10-04: 3 [IU] via SUBCUTANEOUS
  Administered 2018-10-04 – 2018-10-05 (×2): 2 [IU] via SUBCUTANEOUS

## 2018-10-01 MED ORDER — INSULIN ASPART 100 UNIT/ML ~~LOC~~ SOLN
0.0000 [IU] | Freq: Every day | SUBCUTANEOUS | Status: DC
  Administered 2018-10-01 – 2018-10-02 (×2): 3 [IU] via SUBCUTANEOUS
  Administered 2018-10-03 – 2018-10-04 (×2): 2 [IU] via SUBCUTANEOUS

## 2018-10-01 MED ORDER — METHYLPREDNISOLONE SODIUM SUCC 125 MG IJ SOLR
60.0000 mg | Freq: Four times a day (QID) | INTRAMUSCULAR | Status: DC
  Administered 2018-10-01 – 2018-10-02 (×5): 60 mg via INTRAVENOUS
  Filled 2018-10-01 (×5): qty 2

## 2018-10-01 MED ORDER — ORAL CARE MOUTH RINSE
15.0000 mL | Freq: Two times a day (BID) | OROMUCOSAL | Status: DC
  Administered 2018-10-01 – 2018-10-05 (×9): 15 mL via OROMUCOSAL

## 2018-10-01 MED ORDER — AMLODIPINE BESYLATE 5 MG PO TABS
5.0000 mg | ORAL_TABLET | Freq: Every day | ORAL | Status: DC
  Administered 2018-10-01 – 2018-10-04 (×4): 5 mg via ORAL
  Filled 2018-10-01 (×5): qty 1

## 2018-10-01 NOTE — Progress Notes (Addendum)
10/01/18 S: Seen in f/u for acute hypoxemic resp failure.  Feeling better today.  Labs and imaging are stable.    O:   Today's Vitals   10/01/18 0751 10/01/18 0800 10/01/18 0811 10/01/18 0900  BP:  (!) 143/84  (!) 122/53  Pulse:  85  89  Resp:  18  (!) 23  Temp: 97.8 F (36.6 C)     TempSrc: Oral     SpO2:  100% 96% 93%  Weight:      Height:      PainSc:       Body mass index is 23.2 kg/m.  GEN: pleasant elderly woman in NAD HEENT: MMM, HFNC in place CV: Irregular, HSM over precordium, ext warm PULM: Basilar crackles, no accessory muscle use GI: Soft, +BS EXT: No edema NEURO: Moves all 4 ext to command PSYCH: AOx3, RASS 0 SKIN: no rashes  A: Acute hypoxemic respiratory failure- no real signs of infection.  Underlying GPA.  Elevated BNP but looks euvolemic on exam.  Acute on chronic anemia- responded appropriately to transfusion, no signs of bleeding.  Demand mismatch NSTEMI- no further workup needed, echo reassuring  Group 3 pulmonary HTN- not great candidate for targeted therapy.  On HOT.  P: - Switch solumedrol to 64m q6h, taper further tomorrow - Start atovaquone for PCP ppx, probably will need long taper - Azithromycin for anti-inflammatory effects - Wean O2 to maintain sats > 88% - Restart eliquis - OOB to chair - Restart home BP meds - Fine to go to progressive, pulmonary will follow, appreciate TRH taking over as primary   DErskine EmeryMD [facility] 10/01/18 9:55 AM Personal pager: [phone] unanswered, please page CCM On-call: #[phone]

## 2018-10-01 NOTE — Progress Notes (Signed)
RT NOTE: RN transported patient from 3M09 to 2W20 without notifying RT. RN took patient to floor on NRB. Once RT was made aware by 2W RT, RT took heated HFNC to 2W20 and placed it on patient. RT weaned FiO2 to 60% and patient remained on 15L. Patient tolerating well, sat 100%. RT will continue to monitor as needed.

## 2018-10-02 DIAGNOSIS — E038 Other specified hypothyroidism: Secondary | ICD-10-CM

## 2018-10-02 DIAGNOSIS — N183 Chronic kidney disease, stage 3 (moderate): Secondary | ICD-10-CM

## 2018-10-02 LAB — GLUCOSE, CAPILLARY
Glucose-Capillary: 199 mg/dL — ABNORMAL HIGH (ref 70–99)
Glucose-Capillary: 217 mg/dL — ABNORMAL HIGH (ref 70–99)
Glucose-Capillary: 231 mg/dL — ABNORMAL HIGH (ref 70–99)
Glucose-Capillary: 257 mg/dL — ABNORMAL HIGH (ref 70–99)

## 2018-10-02 MED ORDER — AZITHROMYCIN 250 MG PO TABS
250.0000 mg | ORAL_TABLET | Freq: Every day | ORAL | Status: DC
  Administered 2018-10-03 – 2018-10-04 (×2): 250 mg via ORAL
  Filled 2018-10-02 (×2): qty 1

## 2018-10-02 MED ORDER — FAMOTIDINE 20 MG PO TABS
10.0000 mg | ORAL_TABLET | Freq: Two times a day (BID) | ORAL | Status: DC
  Administered 2018-10-02 – 2018-10-05 (×6): 10 mg via ORAL
  Filled 2018-10-02 (×7): qty 1

## 2018-10-02 MED ORDER — PREDNISONE 20 MG PO TABS: 20.0000 mg | ORAL_TABLET | Freq: Every day | ORAL | Status: DC

## 2018-10-02 MED ORDER — PREDNISONE 20 MG PO TABS
40.0000 mg | ORAL_TABLET | Freq: Every day | ORAL | Status: DC
  Administered 2018-10-03 – 2018-10-04 (×2): 40 mg via ORAL
  Filled 2018-10-02 (×3): qty 2

## 2018-10-02 NOTE — Progress Notes (Signed)
10/01/18 S: Seen in f/u for acute hypoxemic resp failure.  Continues to improve, close to baseline.  Denies cough, fever, chills.  O:   Today's Vitals   10/02/18 0600 10/02/18 0800 10/02/18 0803 10/02/18 0807  BP:    133/81  Pulse: 78   81  Resp: 19   (!) 24  Temp:    97.6 F (36.4 C)  TempSrc:    Axillary  SpO2: 100%  100% 100%  Weight:      Height:      PainSc:  0-No pain     Body mass index is 23.7 kg/m.  GEN: pleasant elderly woman in NAD HEENT: MMM, HFNC in place CV: Irregular, HSM over precordium, ext warm PULM: Basilar crackles, no accessory muscle use GI: Soft, +BS EXT: No edema NEURO: Moves all 4 ext to command PSYCH: AOx3, RASS 0 SKIN: no rashes  A: Acute hypoxemic respiratory failure- no real signs of infection.  Underlying GPA with some parenchymal changes.  Elevated BNP but looks euvolemic on exam.  Treating and responding as AE-ILD.  Acute on chronic anemia- responded appropriately to transfusion, no signs of bleeding.  Demand mismatch NSTEMI- no further workup needed, echo reassuring  Group 3 pulmonary HTN- not great candidate for targeted therapy.  On HOT 4-6L.  Hyperglycemia related to steroids  P: - Switch solumedrol to prednisone 60m/day x 1 week then 257mx 1 week (ordered) - DC atovaquone (ordered) - Azithromycin x 5 days, switch to PO (ordered) - Dropped her to 6L O2, see how she does with this today - OOB to chair, PT consult if they are able, if not and she is feeling well to transfer on her own, she can probably go home tomorrow - Please have her follow up in pulmonary clinic (Dr. YoAnnamaria Bootsr whoever if covering for him) within 4 weeks of discharge.  Thank you and we will sign off.  DaErskine EmeryD [facility] 10/02/18 10:54 AM Personal pager: #[phone] unanswered, please page CCM On-call: #[phone]

## 2018-10-02 NOTE — Progress Notes (Signed)
PROGRESS NOTE  [name] [name] UMP:[id number] DOB: 07-25-1932 DOA: 09/16/2018 PCP: [name], MD   LOS: 3 days   Brief Narrative / Interim history: Is an 85 year old female with history of hypertension, pulmonary hypertension, chronic respiratory failure on home oxygen, history of Wegener's (granulomatosis with polyangiitis), permanent A. fib, chronic diastolic CHF, chronic kidney disease stage III with baseline creatinine 1.7-2.5, who was admitted to the hospital on 09/30/2018 due to acute onset shortness of breath and hypoxia.  She was initially admitted to the ICU, placed on high-dose steroids.  Oxygenation improved and was transferred to the floor on the hospitalist service on 6/27  Subjective: Sleeping with her eyes covered, does not interact much.  Answers basic questions but does not open her eyes  Assessment & Plan: Principal Problem:   Acute on chronic respiratory failure with hypoxemia (HCC) Active Problems:   Hypothyroidism   Anemia   Diastolic CHF, chronic (HCC) - on low dose Lasix. ARB & BB along with Norvasc   Chronic atrial fibrillation (HCC) CHADS2-VASc=6.  On Corrected "low" dose of Eliquis   CKD (chronic kidney disease) stage 3, GFR 30-59 ml/min (HCC)   Principal Problem Acute on chronic hypoxic respiratory failure, granulomatosis with polyangiitis -Questionable component infectious process, patient was placed on azithromycin, continue -She is also on Lasix -Continue oxygen, at home she is on 5 to 6 L nasal cannula currently on high flow oxygen -Lower extremity Dopplers negative for DVT, CT angiogram was deferred due to renal failure -Pulmonology following  Active Problems Permanent atrial fibrillation -He is on chronic anticoagulation with Eliquis.  She has been on and off of Eliquis in the recent months due to intermittent epistaxis but currently taking it.  Acute on chronic diastolic CHF with elevated BNP -Maintain net balance, clinically appears  euvolemic right now.  She is net -1.5 L -Continue Lasix  Acute kidney injury on chronic kidney disease stage III -Baseline creatinine 1.7-2.5, currently at 3.1 -Avoid nephrotoxins, closely monitor  Hypertension -Blood pressure appears controlled, continue Lasix, nebivolol  Hypothyroidism -Continue Synthroid  Anemia of chronic disease -No bleeding, she did require a unit of packed red blood cells on 6/24, transfuse for hemoglobin less than 7, currently stable.  Elevated troponin -Cardiology was consulted and followed patient.  She underwent a 2D echo on 09/15/2018 with normal LVEF of 55-60%.  Believed to be demand ischemia   Scheduled Meds:  amLODipine  5 mg Oral Daily   apixaban  2.5 mg Oral BID   atovaquone  1,500 mg Oral Q breakfast   Chlorhexidine Gluconate Cloth  6 each Topical Q0600   furosemide  20 mg Oral Daily   insulin aspart  0-15 Units Subcutaneous TID WC   insulin aspart  0-5 Units Subcutaneous QHS   insulin glargine  5 Units Subcutaneous Q2200   ipratropium-albuterol  3 mL Nebulization TID   levothyroxine  100 mcg Oral QAC breakfast   mouth rinse  15 mL Mouth Rinse BID   methylPREDNISolone (SOLU-MEDROL) injection  60 mg Intravenous Q6H   nebivolol  10 mg Oral Daily   Continuous Infusions:  azithromycin 500 mg (10/02/18 0948)   famotidine (PEPCID) IV 20 mg (10/01/18 0858)   PRN Meds:.acetaminophen, ondansetron (ZOFRAN) IV  DVT prophylaxis: On Eliquis Code Status: Full code Family Communication: Discussed with patient Disposition Plan: To be determined  Consultants:   Pulmonary critical care  Procedures:   2D echo:  IMPRESSIONS   1. The left ventricle has normal systolic function, with an ejection fraction  of 55-60%. The cavity size was normal. There is mildly increased left ventricular wall thickness. Left ventricular diastolic Doppler parameters are indeterminate. No evidence  of left ventricular regional wall motion  abnormalities. 2. The right ventricle has normal systolic function. The cavity was normal. There is no increase in right ventricular wall thickness. 3. Left atrial size was severely dilated. 4. Right atrial size was moderately dilated. 5. No evidence of mitral valve stenosis. Mild mitral regurgitation. 6. Tricuspid valve regurgitation is moderate-severe. 7. The aortic valve is tricuspid. Mild calcification of the aortic valve. Aortic valve regurgitation is mild by color flow Doppler. No stenosis of the aortic valve. 8. The aortic root is normal in size and structure. 9. There is mild dilatation of the ascending aorta measuring 41 mm. 10. The inferior vena cava was dilated in size with <50% respiratory variability. PA systolic pressure 68 mmHg. 11. The patient was in atrial fibrillation.  Antimicrobials:  Azithromycin   Objective: Vitals:   10/02/18 0500 10/02/18 0600 10/02/18 0803 10/02/18 0807  BP:    133/81  Pulse: 81 78  81  Resp: 20 19  (!) 24  Temp:    97.6 F (36.4 C)  TempSrc:    Axillary  SpO2: 100% 100% 100% 100%  Weight:      Height:        Intake/Output Summary (Last 24 hours) at 10/02/2018 1017 Last data filed at 10/02/2018 0600 Gross per 24 hour  Intake 250 ml  Output 732 ml  Net -482 ml   Filed Weights   09/30/18 0500 10/01/18 0100 10/02/18 0329  Weight: 64.9 kg 65.2 kg 66.6 kg    Examination:  Constitutional: No apparent distress, sleeping ENMT: Moist mucous membranes Neck: normal, supple Respiratory: Diminished bilaterally without wheezing or significant crackles, shallow breathing Cardiovascular: Regular rate and rhythm, no murmurs / rubs / gallops.  No peripheral edema Abdomen: no tenderness. Bowel sounds positive.  Musculoskeletal: no clubbing / cyanosis.  Skin: no rashes Neurologic: CN 2-12 grossly intact. Strength 5/5 in all 4.    Data Reviewed: I have independently reviewed following labs and imaging studies   CBC: Recent Labs    Lab 09/28/2018 0145  10/05/2018 0918 10/01/2018 1715 09/24/2018 1836 09/30/18 0353 10/01/18 0345  WBC 10.2  --  11.4* 8.5  --  8.3 7.3  NEUTROABS 8.3*  --   --   --   --   --  6.8  HGB 6.6*   < > 6.3* 7.6* 9.5* 7.4* 7.6*  HCT 21.9*   < > 19.3* 23.7* 28.0* 23.6* 24.2*  MCV 103.8*  --  98.0 98.8  --  99.6 99.6  PLT 399  --  351 335  --  313 308   < > = values in this interval not displayed.   Basic Metabolic Panel: Recent Labs  Lab 09/24/2018 0145 09/21/2018 0841 09/12/2018 0918 10/02/2018 1715 09/19/2018 1836 09/30/18 0353 10/01/18 0345  NA 132* 133* 134*  --  135 134* 131*  K 4.4 4.4 4.3  --  4.2 4.4 4.4  CL 100  --  101  --   --  101 96*  CO2 20*  --  22  --   --  24 25  GLUCOSE 204*  --  152*  --   --  102* 372*  BUN 42*  --  42*  --   --  39* 44*  CREATININE 3.01*  --  3.01*  --   --  2.96* 3.10*  CALCIUM  8.3*  --  8.2*  --   --  8.0* 8.0*  MG  --   --  QUANTITY NOT SUFFICIENT, UNABLE TO PERFORM TEST 2.5*  --  2.4 2.4  PHOS  --   --  4.5  --   --  4.4 5.3*   GFR: Estimated Creatinine Clearance: 12.6 mL/min (A) (by C-G formula based on SCr of 3.1 mg/dL (H)). Liver Function Tests: Recent Labs  Lab 10/03/2018 0145 09/13/2018 0918  AST 17 17  ALT 8 8  ALKPHOS 71 61  BILITOT 1.2 1.3*  PROT 6.9 6.9  ALBUMIN 2.9* 2.9*   No results for input(s): LIPASE, AMYLASE in the last 168 hours. No results for input(s): AMMONIA in the last 168 hours. Coagulation Profile: No results for input(s): INR, PROTIME in the last 168 hours. Cardiac Enzymes: No results for input(s): CKTOTAL, CKMB, CKMBINDEX, TROPONINI in the last 168 hours. BNP (last 3 results) Recent Labs    03/22/18 1056  PROBNP 1,910.0*   HbA1C: No results for input(s): HGBA1C in the last 72 hours. CBG: Recent Labs  Lab 09/06/2018 0805 10/01/18 1705 10/01/18 2202  GLUCAP 138* 339* 288*   Lipid Profile: No results for input(s): CHOL, HDL, LDLCALC, TRIG, CHOLHDL, LDLDIRECT in the last 72 hours. Thyroid Function Tests: No  results for input(s): TSH, T4TOTAL, FREET4, T3FREE, THYROIDAB in the last 72 hours. Anemia Panel: No results for input(s): VITAMINB12, FOLATE, FERRITIN, TIBC, IRON, RETICCTPCT in the last 72 hours. Urine analysis:    Component Value Date/Time   COLORURINE YELLOW 09/13/2018 0539   APPEARANCEUR HAZY (A) 09/26/2018 0539   LABSPEC 1.013 09/23/2018 0539   PHURINE 5.0 10/02/2018 0539   GLUCOSEU NEGATIVE 09/11/2018 0539   HGBUR MODERATE (A) 09/28/2018 0539   BILIRUBINUR NEGATIVE 09/23/2018 0539   BILIRUBINUR n 12/30/2010 1700   KETONESUR NEGATIVE 09/28/2018 0539   PROTEINUR 100 (A) 10/02/2018 0539   UROBILINOGEN 1.0 09/25/2014 1550   NITRITE NEGATIVE 09/09/2018 [place] 09/22/2018 0539   Sepsis Labs: Invalid input(s): PROCALCITONIN, LACTICIDVEN  Recent Results (from the past 240 hour(s))  SARS Coronavirus 2 (CEPHEID- Performed in [facility] hospital lab), Hosp Order     Status: None   Collection Time: 10/04/2018  2:33 AM   Specimen: Nasopharyngeal Swab  Result Value Ref Range Status   SARS Coronavirus 2 NEGATIVE NEGATIVE Final    Comment: (NOTE) If result is NEGATIVE SARS-CoV-2 target nucleic acids are NOT DETECTED. The SARS-CoV-2 RNA is generally detectable in upper and lower  respiratory specimens during the acute phase of infection. The lowest  concentration of SARS-CoV-2 viral copies this assay can detect is 250  copies / mL. A negative result does not preclude SARS-CoV-2 infection  and should not be used as the sole basis for treatment or other  patient management decisions.  A negative result may occur with  improper specimen collection / handling, submission of specimen other  than nasopharyngeal swab, presence of viral mutation(s) within the  areas targeted by this assay, and inadequate number of viral copies  (<250 copies / mL). A negative result must be combined with clinical  observations, patient history, and epidemiological information. If result is  POSITIVE SARS-CoV-2 target nucleic acids are DETECTED. The SARS-CoV-2 RNA is generally detectable in upper and lower  respiratory specimens dur ing the acute phase of infection.  Positive  results are indicative of active infection with SARS-CoV-2.  Clinical  correlation with patient history and other diagnostic information is  necessary to determine  patient infection status.  Positive results do  not rule out bacterial infection or co-infection with other viruses. If result is PRESUMPTIVE POSTIVE SARS-CoV-2 nucleic acids MAY BE PRESENT.   A presumptive positive result was obtained on the submitted specimen  and confirmed on repeat testing.  While 2019 novel coronavirus  (SARS-CoV-2) nucleic acids may be present in the submitted sample  additional confirmatory testing may be necessary for epidemiological  and / or clinical management purposes  to differentiate between  SARS-CoV-2 and other Sarbecovirus currently known to infect humans.  If clinically indicated additional testing with an alternate test  methodology [phone]) is advised. The SARS-CoV-2 RNA is generally  detectable in upper and lower respiratory sp ecimens during the acute  phase of infection. The expected result is Negative. Fact Sheet for Patients:  StrictlyIdeas.no Fact Sheet for Healthcare Providers: BankingDealers.co.za This test is not yet approved or cleared by the [place] FDA and has been authorized for detection and/or diagnosis of SARS-CoV-2 by FDA under an Emergency Use Authorization (EUA).  This EUA will remain in effect (meaning this test can be used) for the duration of the COVID-19 declaration under Section 564(b)(1) of the Act, 21 U.S.C. section 360bbb-3(b)(1), unless the authorization is terminated or revoked sooner. Performed at [facility], [place] [address], [place], [place] [address]   MRSA PCR Screening     Status: None   Collection Time:  09/10/2018  8:17 AM   Specimen: Nasal Mucosa; Nasopharyngeal  Result Value Ref Range Status   MRSA by PCR NEGATIVE NEGATIVE Final    Comment:        The GeneXpert MRSA Assay (FDA approved for NASAL specimens only), is one component of a comprehensive MRSA colonization surveillance program. It is not intended to diagnose MRSA infection nor to guide or monitor treatment for MRSA infections. Performed at [facility], [place] [address], [place], [place] [address]       Radiology Studies: Ct Chest Wo Contrast  Result Date: 09/30/2018 CLINICAL DATA:  Hypoxic respiratory failure. Denies chest pain or shortness of breath. EXAM: CT CHEST WITHOUT CONTRAST TECHNIQUE: Multidetector CT imaging of the chest was performed following the standard protocol without IV contrast. COMPARISON:  Chest x-ray from same day. CT chest dated March 26, 2018. FINDINGS: Cardiovascular: Unchanged cardiomegaly and trace pericardial effusion. Coronary, aortic arch, and branch vessel atherosclerotic vascular disease. Mediastinum/Nodes: New borderline and mildly enlarged mediastinal lymph nodes measuring up to 13 mm in short axis, likely reactive. No enlarged axillary lymph nodes. The thyroid gland, trachea, and esophagus demonstrate no significant findings. Unchanged small hiatal hernia. Lungs/Pleura: Interval worsening in diffuse bilateral ground-glass and consolidative opacities with unchanged areas of bronchiectasis/bronchiolectasis, septal thickening, and architectural distortion. New trace right pleural effusion. No pneumothorax. Upper Abdomen: No acute abnormality. Unchanged 1.7 cm low-density lesion in the pancreatic tail, likely benign. Musculoskeletal: No chest wall mass or suspicious bone lesions identified. IMPRESSION: 1. Interval worsening in diffuse bilateral ground-glass and consolidative opacities with unchanged areas of bronchiectasis/bronchiolectasis, septal thickening, and architectural distortion.  Differential includes worsening underlying granulomatosis with polyangiitis or superimposed multilobar pneumonia. 2. Unchanged cardiomegaly and trace pericardial effusion. New trace right pleural effusion. 3.  Aortic atherosclerosis (ICD10-I70.0). Electronically Signed   By: Titus Dubin M.D.   On: 09/30/2018 15:41   Dg Chest Port 1 View  Result Date: 10/01/2018 CLINICAL DATA:  Respiratory failure.  Pneumonia. EXAM: PORTABLE CHEST 1 VIEW COMPARISON:  Radiographs 09/12/2018 and 09/30/2018.  CT 09/30/2018. FINDINGS: 0513 hours. Stable  cardiomegaly and aortic atherosclerosis. Mild tracheal deviation to the right is unchanged. Patchy bilateral airspace and ground-glass opacities have not significantly changed. There is no pneumothorax or significant pleural effusion. IMPRESSION: No significant change in bilateral airspace and ground-glass opacities. Electronically Signed   By: Richardean Sale M.D.   On: 10/01/2018 08:11    Marzetta Board, MD, PhD [facility]  Contact via  [email or web address]  [place] P: [phone] F: [phone]

## 2018-10-03 ENCOUNTER — Inpatient Hospital Stay (HOSPITAL_COMMUNITY): Payer: Medicare Other

## 2018-10-03 DIAGNOSIS — R0902 Hypoxemia: Secondary | ICD-10-CM

## 2018-10-03 DIAGNOSIS — M3130 Wegener's granulomatosis without renal involvement: Secondary | ICD-10-CM

## 2018-10-03 LAB — GLUCOSE, CAPILLARY
Glucose-Capillary: 173 mg/dL — ABNORMAL HIGH (ref 70–99)
Glucose-Capillary: 241 mg/dL — ABNORMAL HIGH (ref 70–99)
Glucose-Capillary: 208 mg/dL — ABNORMAL HIGH (ref 70–99)
Glucose-Capillary: 234 mg/dL — ABNORMAL HIGH (ref 70–99)

## 2018-10-03 LAB — BASIC METABOLIC PANEL
Anion gap: 13 (ref 5–15)
BUN: 64 mg/dL — ABNORMAL HIGH (ref 8–23)
CO2: 23 mmol/L (ref 22–32)
Calcium: 8.3 mg/dL — ABNORMAL LOW (ref 8.9–10.3)
Chloride: 93 mmol/L — ABNORMAL LOW (ref 98–111)
Creatinine, Ser: 3.39 mg/dL — ABNORMAL HIGH (ref 0.44–1.00)
GFR calc Af Amer: 14 mL/min — ABNORMAL LOW (ref >60)
GFR calc non Af Amer: 12 mL/min — ABNORMAL LOW (ref >60)
Glucose, Bld: 185 mg/dL — ABNORMAL HIGH (ref 70–99)
Potassium: 4.7 mmol/L (ref 3.5–5.1)
Sodium: 129 mmol/L — ABNORMAL LOW (ref 135–145)

## 2018-10-03 NOTE — Discharge Instructions (Signed)
Information on my medicine - ELIQUIS (apixaban)  This medication education was reviewed with me or my healthcare representative as part of my discharge preparation.  The pharmacist that spoke with me during my hospital stay was:  Ronna Polio, [facility]  Why was Eliquis prescribed for you? Eliquis was prescribed for you to reduce the risk of blood clots forming after orthopedic surgery.    What do You need to know about Eliquis? Take your Eliquis TWICE DAILY - one tablet in the morning and one tablet in the evening with or without food.  It would be best to take the dose about the same time each day.  If you have difficulty swallowing the tablet whole please discuss with your pharmacist how to take the medication safely.  Take Eliquis exactly as prescribed by your doctor and DO NOT stop taking Eliquis without talking to the doctor who prescribed the medication.  Stopping without other medication to take the place of Eliquis may increase your risk of developing a clot.  After discharge, you should have regular check-up appointments with your healthcare provider that is prescribing your Eliquis.  What do you do if you miss a dose? If a dose of ELIQUIS is not taken at the scheduled time, take it as soon as possible on the same day and twice-daily administration should be resumed.  The dose should not be doubled to make up for a missed dose.  Do not take more than one tablet of ELIQUIS at the same time.  Important Safety Information A possible side effect of Eliquis is bleeding. You should call your healthcare provider right away if you experience any of the following: ? Bleeding from an injury or your nose that does not stop. ? Unusual colored urine (red or dark brown) or unusual colored stools (red or black). ? Unusual bruising for unknown reasons. ? A serious fall or if you hit your head (even if there is no bleeding).  Some medicines may interact with Eliquis and might increase  your risk of bleeding or clotting while on Eliquis. To help avoid this, consult your healthcare provider or pharmacist prior to using any new prescription or non-prescription medications, including herbals, vitamins, non-steroidal anti-inflammatory drugs (NSAIDs) and supplements.  This website has more information on Eliquis (apixaban): http://www.eliquis.com/eliquis/home

## 2018-10-03 NOTE — Progress Notes (Signed)
PROGRESS NOTE  [name] [name] PPI:[id number] DOB: Feb 13, 1933 DOA: 09/22/2018 PCP: [name], MD   LOS: 4 days   Brief Narrative / Interim history: Is an 85 year old female with history of hypertension, pulmonary hypertension, chronic respiratory failure on home oxygen, history of Wegener's (granulomatosis with polyangiitis), permanent A. fib, chronic diastolic CHF, chronic kidney disease stage III with baseline creatinine 1.7-2.5, who was admitted to the hospital on 09/16/2018 due to acute onset shortness of breath and hypoxia.  She was initially admitted to the ICU, placed on high-dose steroids.  Oxygenation improved and was transferred to the floor on the hospitalist service on 6/27  Subjective: No complaints this morning. States that she is hoping for discharge.   Assessment & Plan: Principal Problem:   Acute on chronic respiratory failure with hypoxemia (HCC) Active Problems:   Hypothyroidism   Anemia   Diastolic CHF, chronic (HCC) - on low dose Lasix. ARB & BB along with Norvasc   Chronic atrial fibrillation (HCC) CHADS2-VASc=6.  On Corrected "low" dose of Eliquis   CKD (chronic kidney disease) stage 3, GFR 30-59 ml/min (HCC)   Principal Problem Acute on chronic hypoxic respiratory failure, granulomatosis with polyangiitis -Questionable component infectious process, patient was placed on azithromycin, continue, total of 5 days -Was on Lasix, d/c yesterday due to Cr 3.1 -Continue oxygen, at home she is on 5 to 6 L with ambulation, currently 2-3 L at rest. Appears at baseline -Lower extremity Dopplers negative for DVT, CT angiogram was deferred due to renal failure  Active Problems Permanent atrial fibrillation -She is on chronic anticoagulation with Eliquis.  She has been on and off of Eliquis in the recent months due to intermittent epistaxis but currently taking it.  Acute on chronic diastolic CHF with elevated BNP -Maintain net balance, clinically appears euvolemic  right now.  She is net -1.5 L -Lasix on hold, appears slightly intravascularly depleted. Renal to see  Acute kidney injury on chronic kidney disease stage III -Baseline creatinine 1.7-2.5, currently slightly worsening at 3.4 -Avoid nephrotoxins, closely monitor -patient surprised to hear that she has CKD, Her Cr has been significantly higher during this hospitalization in the 3 range, 1.68 earlier this year.  -consulted nephrology, d/w Dr. Jonnie Finner, appreciate input  Hypertension -Blood pressure appears controlled, continue nebivolol  Hypothyroidism -Continue Synthroid  Anemia of chronic disease -No bleeding noted, she did require a unit of packed red blood cells on 6/24, transfuse for hemoglobin less than 7, currently stable. -likely due to CKD  Elevated troponin -Cardiology was consulted and followed patient.  She underwent a 2D echo on 09/18/2018 with normal LVEF of 55-60%.  Believed to be demand ischemia   Scheduled Meds: . amLODipine  5 mg Oral Daily  . apixaban  2.5 mg Oral BID  . azithromycin  250 mg Oral Daily  . Chlorhexidine Gluconate Cloth  6 each Topical Q0600  . famotidine  10 mg Oral BID  . insulin aspart  0-15 Units Subcutaneous TID WC  . insulin aspart  0-5 Units Subcutaneous QHS  . insulin glargine  5 Units Subcutaneous Q2200  . ipratropium-albuterol  3 mL Nebulization TID  . levothyroxine  100 mcg Oral QAC breakfast  . mouth rinse  15 mL Mouth Rinse BID  . nebivolol  10 mg Oral Daily  . predniSONE  40 mg Oral Q breakfast   Followed by  . [START ON 10/10/2018] predniSONE  20 mg Oral Q breakfast   Continuous Infusions:  PRN Meds:.acetaminophen, ondansetron (ZOFRAN) IV  DVT prophylaxis: On Eliquis Code Status: Full code Family Communication: Discussed with patient Disposition Plan: To be determined  Consultants:   Pulmonary critical care  Procedures:   2D echo:  IMPRESSIONS   1. The left ventricle has normal systolic function, with an ejection  fraction of 55-60%. The cavity size was normal. There is mildly increased left ventricular wall thickness. Left ventricular diastolic Doppler parameters are indeterminate. No evidence  of left ventricular regional wall motion abnormalities. 2. The right ventricle has normal systolic function. The cavity was normal. There is no increase in right ventricular wall thickness. 3. Left atrial size was severely dilated. 4. Right atrial size was moderately dilated. 5. No evidence of mitral valve stenosis. Mild mitral regurgitation. 6. Tricuspid valve regurgitation is moderate-severe. 7. The aortic valve is tricuspid. Mild calcification of the aortic valve. Aortic valve regurgitation is mild by color flow Doppler. No stenosis of the aortic valve. 8. The aortic root is normal in size and structure. 9. There is mild dilatation of the ascending aorta measuring 41 mm. 10. The inferior vena cava was dilated in size with <50% respiratory variability. PA systolic pressure 68 mmHg. 11. The patient was in atrial fibrillation.  Antimicrobials:  Azithromycin   Objective: Vitals:   10/03/18 0500 10/03/18 0600 10/03/18 0700 10/03/18 0726  BP:      Pulse: 68 67 66   Resp: 16 17 (!) 22   Temp:      TempSrc:      SpO2: 98% 96% 95% 92%  Weight:      Height:        Intake/Output Summary (Last 24 hours) at 10/03/2018 1140 Last data filed at 10/03/2018 0500 Gross per 24 hour  Intake -  Output 600 ml  Net -600 ml   Filed Weights   10/01/18 0100 10/02/18 0329 10/03/18 0300  Weight: 65.2 kg 66.6 kg 69.2 kg    Examination:  Constitutional: no distress, about to have breakfast  ENMT: mmm Neck: normal, supple Respiratory: diminished at the bases bilaterally, no wheezing, no crackles Cardiovascular: RRR, no new murmurs, no edema Abdomen: soft, NT, ND, BS+ Musculoskeletal: no clubbing / cyanosis.  Skin: no rashes seen Neurologic: equal strength, non focal    Data Reviewed: I have  independently reviewed following labs and imaging studies   CBC: Recent Labs  Lab 09/18/2018 0145  09/28/2018 0918 09/09/2018 1715 09/13/2018 1836 09/30/18 0353 10/01/18 0345  WBC 10.2  --  11.4* 8.5  --  8.3 7.3  NEUTROABS 8.3*  --   --   --   --   --  6.8  HGB 6.6*   < > 6.3* 7.6* 9.5* 7.4* 7.6*  HCT 21.9*   < > 19.3* 23.7* 28.0* 23.6* 24.2*  MCV 103.8*  --  98.0 98.8  --  99.6 99.6  PLT 399  --  351 335  --  313 308   < > = values in this interval not displayed.   Basic Metabolic Panel: Recent Labs  Lab 09/15/2018 0145  09/17/2018 0918 10/04/2018 1715 09/22/2018 1836 09/30/18 0353 10/01/18 0345 10/03/18 0730  NA 132*   < > 134*  --  135 134* 131* 129*  K 4.4   < > 4.3  --  4.2 4.4 4.4 4.7  CL 100  --  101  --   --  101 96* 93*  CO2 20*  --  22  --   --  _0 GLUCOSE 204*  --  152*  --   --  102* 372* 185*  BUN 42*  --  42*  --   --  39* 44* 64*  CREATININE 3.01*  --  3.01*  --   --  2.96* 3.10* 3.39*  CALCIUM 8.3*  --  8.2*  --   --  8.0* 8.0* 8.3*  MG  --   --  QUANTITY NOT SUFFICIENT, UNABLE TO PERFORM TEST 2.5*  --  2.4 2.4  --   PHOS  --   --  4.5  --   --  4.4 5.3*  --    < > = values in this interval not displayed.   GFR: Estimated Creatinine Clearance: 11.6 mL/min (A) (by C-G formula based on SCr of 3.39 mg/dL (H)). Liver Function Tests: Recent Labs  Lab 09/13/2018 0145 09/24/2018 0918  AST 17 17  ALT 8 8  ALKPHOS 71 61  BILITOT 1.2 1.3*  PROT 6.9 6.9  ALBUMIN 2.9* 2.9*   No results for input(s): LIPASE, AMYLASE in the last 168 hours. No results for input(s): AMMONIA in the last 168 hours. Coagulation Profile: No results for input(s): INR, PROTIME in the last 168 hours. Cardiac Enzymes: No results for input(s): CKTOTAL, CKMB, CKMBINDEX, TROPONINI in the last 168 hours. BNP (last 3 results) Recent Labs    03/22/18 1056  PROBNP 1,910.0*   HbA1C: No results for input(s): HGBA1C in the last 72 hours. CBG: Recent Labs  Lab 10/02/18 0805 10/02/18 1124  10/02/18 1701 10/02/18 2114 10/03/18 0803  GLUCAP 199* 217* 231* 257* 173*   Lipid Profile: No results for input(s): CHOL, HDL, LDLCALC, TRIG, CHOLHDL, LDLDIRECT in the last 72 hours. Thyroid Function Tests: No results for input(s): TSH, T4TOTAL, FREET4, T3FREE, THYROIDAB in the last 72 hours. Anemia Panel: No results for input(s): VITAMINB12, FOLATE, FERRITIN, TIBC, IRON, RETICCTPCT in the last 72 hours. Urine analysis:    Component Value Date/Time   COLORURINE YELLOW 09/12/2018 0539   APPEARANCEUR HAZY (A) 09/18/2018 0539   LABSPEC 1.013 09/25/2018 0539   PHURINE 5.0 09/18/2018 0539   GLUCOSEU NEGATIVE 09/07/2018 0539   HGBUR MODERATE (A) 09/30/2018 0539   BILIRUBINUR NEGATIVE 09/23/2018 0539   BILIRUBINUR n 12/30/2010 1700   KETONESUR NEGATIVE 09/25/2018 0539   PROTEINUR 100 (A) 10/03/2018 0539   UROBILINOGEN 1.0 09/25/2014 1550   NITRITE NEGATIVE 09/28/2018 [place] 09/21/2018 0539   Sepsis Labs: Invalid input(s): PROCALCITONIN, LACTICIDVEN  Recent Results (from the past 240 hour(s))  SARS Coronavirus 2 (CEPHEID- Performed in [place] hospital lab), Hosp Order     Status: None   Collection Time: 09/23/2018  2:33 AM   Specimen: Nasopharyngeal Swab  Result Value Ref Range Status   SARS Coronavirus 2 NEGATIVE NEGATIVE Final    Comment: (NOTE) If result is NEGATIVE SARS-CoV-2 target nucleic acids are NOT DETECTED. The SARS-CoV-2 RNA is generally detectable in upper and lower  respiratory specimens during the acute phase of infection. The lowest  concentration of SARS-CoV-2 viral copies this assay can detect is 250  copies / mL. A negative result does not preclude SARS-CoV-2 infection  and should not be used as the sole basis for treatment or other  patient management decisions.  A negative result may occur with  improper specimen collection / handling, submission of specimen other  than nasopharyngeal swab, presence of viral mutation(s) within the   areas targeted by this assay, and inadequate number of viral copies  (<250 copies / mL). A negative result must be combined with clinical  observations, patient  history, and epidemiological information. If result is POSITIVE SARS-CoV-2 target nucleic acids are DETECTED. The SARS-CoV-2 RNA is generally detectable in upper and lower  respiratory specimens dur ing the acute phase of infection.  Positive  results are indicative of active infection with SARS-CoV-2.  Clinical  correlation with patient history and other diagnostic information is  necessary to determine patient infection status.  Positive results do  not rule out bacterial infection or co-infection with other viruses. If result is PRESUMPTIVE POSTIVE SARS-CoV-2 nucleic acids MAY BE PRESENT.   A presumptive positive result was obtained on the submitted specimen  and confirmed on repeat testing.  While 2019 novel coronavirus  (SARS-CoV-2) nucleic acids may be present in the submitted sample  additional confirmatory testing may be necessary for epidemiological  and / or clinical management purposes  to differentiate between  SARS-CoV-2 and other Sarbecovirus currently known to infect humans.  If clinically indicated additional testing with an alternate test  methodology [phone]) is advised. The SARS-CoV-2 RNA is generally  detectable in upper and lower respiratory sp ecimens during the acute  phase of infection. The expected result is Negative. Fact Sheet for Patients:  StrictlyIdeas.no Fact Sheet for Healthcare Providers: BankingDealers.co.za This test is not yet approved or cleared by the [place] FDA and has been authorized for detection and/or diagnosis of SARS-CoV-2 by FDA under an Emergency Use Authorization (EUA).  This EUA will remain in effect (meaning this test can be used) for the duration of the COVID-19 declaration under Section 564(b)(1) of the Act, 21 U.S.C.  section 360bbb-3(b)(1), unless the authorization is terminated or revoked sooner. Performed at [facility], [place] [address], [place], [place] [address]   MRSA PCR Screening     Status: None   Collection Time: 09/16/2018  8:17 AM   Specimen: Nasal Mucosa; Nasopharyngeal  Result Value Ref Range Status   MRSA by PCR NEGATIVE NEGATIVE Final    Comment:        The GeneXpert MRSA Assay (FDA approved for NASAL specimens only), is one component of a comprehensive MRSA colonization surveillance program. It is not intended to diagnose MRSA infection nor to guide or monitor treatment for MRSA infections. Performed at [facility], [place] [address], [place], [place] [address]       Radiology Studies: No results found.  Marzetta Board, MD, PhD [facility]  Contact via  [email or web address]  [place] P: [phone] F: [phone]

## 2018-10-03 NOTE — Consult Note (Signed)
Renal Service Consult Note [facility]  [name] [name] 10/03/2018 [place] Requesting Physician:  Dr Cruzita Lederer  Reason for Consult:  CKD HPI: The patient is a 85 y.o. year-old with hx of HTN, HL, DM2, CVA, chron atrial fib, hx carcinoid w/ metastatic disease, hx of pulm Wegener's , hx breast Cancer presented on 6/24 with SOB and hypoxemia, also nausea.  Admitted by CCM w/ diagnosis of a/c diast HF and started on IV lasix, BP meds held.  Severe anemia Hb 6.6. [name] seen by cards c/w demand. On BB for afib, eliquis held for anemia. Pt diuresed over the last few days about 2L negative overall since admit. Pt w/ hx of CKD, creat 3.0 here, asked to see for renal failure.    Per chart patient has hx of C-ANCA granulomatous polyangiitis sp VATS biopsy vs versus cryptogenic organizing pneumonia, last seen by Dr Keturah Barre of pulm in 03/2018 who recommended continuing her prednisone 5 mg/ q maint Rx for for her GPA at that time.   Patient denies hx of renal failure , voiding issues, nsaid use.  No leg swelling, no N/V or loss of appetite.  No change in urine color or amount.    ROS  denies CP  no joint pain   no HA  no blurry vision  no rash   Past Medical History  Past Medical History:  Diagnosis Date  . Arthritis    r tkr  . Breast cancer ([place])   . Chronic atrial fibrillation    CHADS2-VASc=6.  on low dose Eliquis (corrected for age & renal fxn)  . CVA (cerebral vascular accident) ([place]) 2008   mini stroke.no residual  . DEPRESSION 05/16/2009  . Diabetes mellitus type II   . Heart murmur    stress test 2009.dr peter [place]  . HYPERLIPIDEMIA 07/24/2008  . HYPERTENSION 07/24/2008  . HYPOTHYROIDISM 07/24/2008  . Intermittent vertigo   . Metastatic carcinoid tumor to intra-abdominal site ([place])   . Pneumonia   . Pulmonary hypertension ([place])    Mod PHTN on Echo 03/2016: 2/2 combination of Wegener's, HFPF & Afib.  . Skin abnormality    facial lesions .pt applying  mupiracin to areas  . Vertigo   . Wegener's disease, pulmonary ([place]) 10/2012   Past Surgical History  Past Surgical History:  Procedure Laterality Date  . BREAST SURGERY  2007   Lumpectomy, XRT 2006.l breast  . CHOLECYSTECTOMY  1980  . EXPLORATORY LAPAROTOMY    . HEEL SPUR SURGERY Right   . JOINT REPLACEMENT     r knee  . KNEE SURGERY  2009   TKR  . TEE WITHOUT CARDIOVERSION N/A 09/16/2017   Procedure: TRANSESOPHAGEAL ECHOCARDIOGRAM (TEE);  Surgeon: Larey Dresser, MD;  Location: [facility] ENDOSCOPY;  Service: Cardiovascular;  Laterality: N/A;  . TRANSTHORACIC ECHOCARDIOGRAM  03/2016   Afib (no Diastolic Fxn). EF 55-60%. No RWMA. Mild Aortic dilation Mild MR. Severe LA dilation. Mod RA dilation.  Mod TR with Mod-Severe Pulmonary HTN - but normal RV function..  . VENTRAL HERNIA REPAIR N/A 07/01/2016   Procedure: REPAIR OF INCARCERATED INCISIONAL HERNIA ;  Surgeon: Stark Klein, MD;  Location: [facility] ORS;  Service: General;  Laterality: N/A;  . VIDEO ASSISTED THORACOSCOPY  04/22/2011   Procedure: VIDEO ASSISTED THORACOSCOPY;  Surgeon: Melrose Nakayama, MD;  Location: [facility];  Service: Thoracic;  Laterality: Left;  WITH BIOPSY   Family History  Family History  Problem Relation Age of Onset  . Arthritis Mother   .  Rheum arthritis Mother   . Heart disease Father   . Coronary artery disease Father   . Cancer Sister        breast CA, both sisters  . Breast cancer Sister   . Coronary artery disease Brother   . Lung cancer Brother    Social History  reports that she quit smoking about 36 years ago. Her smoking use included cigarettes. She has a 6.00 pack-year smoking history. She has never used smokeless tobacco. She reports that she does not drink alcohol or use drugs. Allergies  Allergies  Allergen Reactions  . Codeine Sulfate Other (See Comments)    REACTION: GI upset  . Sulfonamide Derivatives Other (See Comments)    REACTION: GI upset  . Vancomycin Other (See Comments)    Red man  syndrome  . Atarax [Hydroxyzine] Nausea Only and Rash   Home medications Prior to Admission medications   Medication Sig Start Date End Date Taking? Authorizing Provider  acetaminophen (TYLENOL) 500 MG tablet Take 1,000 mg by mouth every 6 (six) hours as needed for headache.    Yes [provider]  amLODipine (NORVASC) 5 MG tablet Take 1 tablet by mouth once daily Patient taking differently: Take 5 mg by mouth daily.  09/16/18  Yes Burchette, Alinda Sierras, MD  BYSTOLIC 10 MG tablet TAKE 1 TABLET BY MOUTH ONCE DAILY AFTER  BREAKFAST Patient taking differently: Take 10 mg by mouth daily.  02/22/18  Yes Burchette, Alinda Sierras, MD  diazepam (VALIUM) 5 MG tablet TAKE 1 TABLET BY MOUTH EVERY 12 HOURS AS NEEDED FOR SEVERE VERTIGO FLARES. AVOID REGULAR USE. Patient taking differently: Take 5 mg by mouth every 12 (twelve) hours as needed (vertigo flares).  11/02/17  Yes Burchette, Alinda Sierras, MD  famotidine (PEPCID) 10 MG tablet Take 1 tablet (10 mg total) by mouth 2 (two) times daily. 03/21/18  Yes Eugenie Filler, MD  feeding supplement, ENSURE ENLIVE, (ENSURE ENLIVE) LIQD Take 237 mLs by mouth 2 (two) times daily between meals. 09/18/17  Yes Debbe Odea, MD  fluticasone (FLONASE) 50 MCG/ACT nasal spray Place 2 sprays into both nostrils daily. 09/24/18  Yes Isaac Bliss, Rayford Halsted, MD  furosemide (LASIX) 20 MG tablet Take 1 tablet (20 mg total) by mouth daily as needed for fluid or edema. 06/25/18  Yes Burchette, Alinda Sierras, MD  gabapentin (NEURONTIN) 100 MG capsule TAKE 1 CAPSULE BY MOUTH TWICE DAILY BEFORE MEAL(S) Patient taking differently: Take 100 mg by mouth 2 (two) times daily.  06/01/18  Yes Burchette, Alinda Sierras, MD  glimepiride (AMARYL) 2 MG tablet TAKE ONE TABLET BY MOUTH ONCE DAILY BEFORE BREAKFAST Patient taking differently: Take 2 mg by mouth daily as needed (high blood sugar).  10/07/16  Yes Burchette, Alinda Sierras, MD  guaiFENesin (MUCINEX) 600 MG 12 hr tablet Take 1 tablet (600 mg total) by mouth 2  (two) times daily as needed for cough. 09/17/17  Yes Debbe Odea, MD  levothyroxine (SYNTHROID, LEVOTHROID) 100 MCG tablet TAKE 1 TABLET BY MOUTH ONCE DAILY BEFORE BREAKFAST. Patient taking differently: Take 100 mcg by mouth daily before breakfast.  07/27/17  Yes Burchette, Alinda Sierras, MD  loratadine (CLARITIN) 10 MG tablet Take 10 mg by mouth daily.   Yes [provider]  Multiple Vitamins-Minerals (CENTRUM SILVER 50+WOMEN) TABS Take 1 tablet by mouth daily.    Yes [provider]  ondansetron (ZOFRAN-ODT) 4 MG disintegrating tablet Take 1 tablet (4 mg total) by mouth every 8 (eight) hours as needed for nausea  or vomiting. 09/18/17  Yes Burchette, Alinda Sierras, MD  oxymetazoline (AFRIN) 0.05 % nasal spray Place 1 spray into both nostrils 2 (two) times daily as needed for congestion.   Yes [provider]  amoxicillin-clavulanate (AUGMENTIN) 875-125 MG tablet Take 1 tablet by mouth 2 (two) times daily. One po bid x 7 days Patient not taking: Reported on 09/26/2018 08/19/18   Deno Etienne, DO  apixaban (ELIQUIS) 2.5 MG TABS tablet Take 2.5 mg by mouth 2 (two) times daily.    [provider]  predniSONE (DELTASONE) 10 MG tablet Take three tablets once daily for 7 days. Patient not taking: Reported on 09/27/2018 08/17/18   [name], MD  predniSONE (DELTASONE) 20 MG tablet Take 2 tablets (40 mg total) by mouth daily with breakfast. Patient not taking: Reported on 10/01/2018 03/22/18   Eugenie Filler, MD   Liver Function Tests Recent Labs  Lab 09/28/2018 0145 09/06/2018 0918  AST 17 17  ALT 8 8  ALKPHOS 71 61  BILITOT 1.2 1.3*  PROT 6.9 6.9  ALBUMIN 2.9* 2.9*   No results for input(s): LIPASE, AMYLASE in the last 168 hours. CBC Recent Labs  Lab 09/12/2018 0145  10/04/2018 1715 09/13/2018 1836 09/30/18 0353 10/01/18 0345  WBC 10.2   < > 8.5  --  8.3 7.3  NEUTROABS 8.3*  --   --   --   --  6.8  HGB 6.6*   < > 7.6* 9.5* 7.4* 7.6*  HCT 21.9*   < > 23.7* 28.0*  23.6* 24.2*  MCV 103.8*   < > 98.8  --  99.6 99.6  PLT 399   < > 335  --  313 308   < > = values in this interval not displayed.   Basic Metabolic Panel Recent Labs  Lab 09/20/2018 0145 09/19/2018 0841 09/28/2018 0918 09/17/2018 1836 09/30/18 0353 10/01/18 0345 10/03/18 0730  NA 132* 133* 134* 135 134* 131* 129*  K 4.4 4.4 4.3 4.2 4.4 4.4 4.7  CL 100  --  101  --  101 96* 93*  CO2 20*  --  22  --  _0 GLUCOSE 204*  --  152*  --  102* 372* 185*  BUN 42*  --  42*  --  39* 44* 64*  CREATININE 3.01*  --  3.01*  --  2.96* 3.10* 3.39*  CALCIUM 8.3*  --  8.2*  --  8.0* 8.0* 8.3*  PHOS  --   --  4.5  --  4.4 5.3*  --    Iron/TIBC/Ferritin/ %Sat    Component Value Date/Time   IRON 27 (L) 09/27/2014 0449   TIBC 242 (L) 09/27/2014 0449   FERRITIN 291 09/27/2014 0449   IRONPCTSAT 11 09/27/2014 0449   IRONPCTSAT 21 01/24/2011 0939    Vitals:   10/03/18 0500 10/03/18 0600 10/03/18 0700 10/03/18 0726  BP:      Pulse: 68 67 66   Resp: 16 17 (!) 22   Temp:      TempSrc:      SpO2: 98% 96% 95% 92%  Weight:      Height:        Exam Gen alert, no distress, nasal O2, hoh No rash, cyanosis or gangrene Sclera anicteric, throat clear  JVD to angle of ja Chest scattered crackles bilat, no wheezing/ bronch bs RRR no RG, loud holosyst M Abd soft ntnd no mass or ascites +bs GU defer MS no joint effusions or deformity  Ext no LE or UE edema, no wounds or ulcers Neuro is alert, Ox 3 , nf    Home meds:  - amlodipine 5 qd/ bystolic 10 qd/ furosemide 20 qd  - famotidine 10 bid/ levothyroxine 100 qd  - glimepiride 78m qd  - gabapentin 100 bid/ valium prn  - prn's/ vitamins/ supplements    Date   Creat  eGFR  2010- 2013  0.7- 1.0   2014- 12/2015  1.2- 1.9  01/2016- 2018 1.2- 2.0 18- 44  09/2017- 05/2018 1.8- 2.4 18- 29   10/01/2018  3.01  14  10/03/18  3.39  12   UA 6/24 hazy, mod Hb, 5.0, few bact, 100 prot 11- 20 rbc, 0-5 wbc    Assessment/ Plan: 1. CKD - progressive CKD over  last 5-6 years, stage IV late now. Has lung disease of GPA w/ sig parenchymal changes.  She may or may not have ANCA related renal disease, but at this point unlikely to respond to any related Rx. Recommend CKD management.  Check renal [place]  No indication for dialysis at this time. Will follow.  2. Volume - no edema on exam 3. Hypoxemic resp failure- sever bilat infiltrates on CXR and by CT scan.  CCM following, treating as acute exac of ILD w/ bolus steroids. Also got course of abx.  4. Anemia - related to CKD in some part 5. Pulm HTN      RKelly Splinter MD 10/03/2018, 11:36 AM

## 2018-10-03 NOTE — Progress Notes (Signed)
SATURATION QUALIFICATIONS: (This note is used to comply with regulatory documentation for home oxygen)  Patient Saturations on Room Air at Rest = 80%  Patient Saturations on Room Air while Ambulating = Did not perform as desat on RA at rest.   Patient Saturations on 6 Liters of oxygen while Ambulating = 92%  Please briefly explain why patient needs home oxygen:Pt needed 3L at rest to maintain sats >90% and 6L with activity.   [place] Pager:  [phone]  Office:  [phone]

## 2018-10-03 NOTE — Evaluation (Addendum)
Physical Therapy Evaluation Patient Details Name: [name] [name] MRN: [id number] DOB: Feb 13, 1933 Today's Date: 10/03/2018   History of Present Illness  85 year old female with history of hypertension, pulmonary hypertension, chronic respiratory failure on home oxygen, history of Wegener's (granulomatosis with polyangiitis), permanent A. fib, chronic diastolic CHF, chronic kidney disease stage III with baseline creatinine 1.7-2.5, who was admitted to the hospital on 09/12/2018 due to acute onset shortness of breath and hypoxia.  She was initially admitted to the ICU, placed on high-dose steroids.  Oxygenation improved and was transferred to the floor on the hospitalist service on 6/27  Clinical Impression  Pt admitted with above diagnosis. Pt currently with functional limitations due to the deficits listed below (see PT Problem List). Pt was able to ambulate with RW with min guard assist to supervision.  Pt did well overall with DOE 3/4 and needed 6LO2 to maintain sats >90%.  Pt has support at home and should progress well.  Will follow acutely.   Pt will benefit from skilled PT to increase their independence and safety with mobility to allow discharge to the venue listed below.   Patient Saturations on Room Air at Rest = 80%  Patient Saturations on [employer] while Ambulating = Did not perform as desat on RA at rest.   Patient Saturations on 6 Liters of oxygen while Ambulating = 92%  Please briefly explain why patient needs home oxygen:Pt needed 3L at rest to maintain sats >90% and 6L with activity.    Follow Up Recommendations Home health PT;Supervision/Assistance - 24 hour    Equipment Recommendations  Other (comment)(home O2)    Recommendations for Other Services       Precautions / Restrictions Precautions Precautions: Fall Restrictions Weight Bearing Restrictions: No      Mobility  Bed Mobility Overal bed mobility: Independent                Transfers Overall transfer  level: Independent                  Ambulation/Gait Ambulation/Gait assistance: Supervision Gait Distance (Feet): 120 Feet Assistive device: Rolling walker (2 wheeled) Gait Pattern/deviations: Step-through pattern;Decreased stride length;Trunk flexed   Gait velocity interpretation: <1.31 ft/sec, indicative of household ambulator General Gait Details: Pt able to ambulate with RW wtih good stability with the RW.  Did desat on RA at rest and needed 6LO2 with activity to maintain sats >90%.  At end of session nurse had pt on 2L at rest with sats 91%.  Educated pt regarding incentive spirometer use and pt returned demonstration.    Stairs            Wheelchair Mobility    Modified Rankin (Stroke Patients Only)       Balance Overall balance assessment: Needs assistance Sitting-balance support: No upper extremity supported;Feet supported Sitting balance-Leahy Scale: Fair     Standing balance support: Bilateral upper extremity supported;During functional activity Standing balance-Leahy Scale: Poor Standing balance comment: relies on UE support for balance              High level balance activites: Direction changes;Turns;Sudden stops;Head turns High Level Balance Comments: No issues with above with RW use             Pertinent Vitals/Pain Pain Assessment: No/denies pain    Home Living Family/patient expects to be discharged to:: Private residence Living Arrangements: Spouse/significant other Available Help at Discharge: Family Type of Home: House Home Access: Stairs to enter Entrance Stairs-Rails: Right;Left Entrance  Stairs-Number of Steps: 3 Home Layout: One level Home Equipment: [place] - 2 wheels;Walker - 4 wheels;Grab bars - toilet;Grab bars - tub/shower;Bedside commode;Shower seat Additional Comments: spouse with dementia, 3 sons close and assist    Prior Function Level of Independence: Independent with assistive device(s)         Comments:  used RW      Hand Dominance        Extremity/Trunk Assessment   Upper Extremity Assessment Upper Extremity Assessment: Defer to OT evaluation    Lower Extremity Assessment Lower Extremity Assessment: Generalized weakness    Cervical / Trunk Assessment Cervical / Trunk Assessment: Normal  Communication   Communication: No difficulties  Cognition Arousal/Alertness: Awake/alert Behavior During Therapy: WFL for tasks assessed/performed Overall Cognitive Status: Within Functional Limits for tasks assessed                                        General Comments      Exercises     Assessment/Plan    PT Assessment Patient needs continued PT services  PT Problem List Decreased activity tolerance;Decreased balance;Decreased mobility;Decreased knowledge of use of DME;Decreased safety awareness;Decreased knowledge of precautions;Cardiopulmonary status limiting activity       PT Treatment Interventions DME instruction;Gait training;Functional mobility training;Therapeutic activities;Therapeutic exercise;Balance training;Patient/family education;Stair training    PT Goals (Current goals can be found in the Care Plan section)  Acute Rehab PT Goals Patient Stated Goal: to go home PT Goal Formulation: With patient Time For Goal Achievement: 10/17/18 Potential to Achieve Goals: Good    Frequency Min 3X/week   Barriers to discharge        Co-evaluation               AM-PAC PT "6 Clicks" Mobility  Outcome Measure Help needed turning from your back to your side while in a flat bed without using bedrails?: None Help needed moving from lying on your back to sitting on the side of a flat bed without using bedrails?: None Help needed moving to and from a bed to a chair (including a wheelchair)?: None Help needed standing up from a chair using your arms (e.g., wheelchair or bedside chair)?: A Little Help needed to walk in hospital room?: A Little Help  needed climbing 3-5 steps with a railing? : A Little 6 Click Score: 21    End of Session Equipment Utilized During Treatment: Gait belt;Oxygen Activity Tolerance: Patient limited by fatigue Patient left: in chair;with call bell/phone within reach;with chair alarm set Nurse Communication: Mobility status PT Visit Diagnosis: Muscle weakness (generalized) (M62.81);Other abnormalities of gait and mobility (R26.89)    Time: 0945-1010 PT Time Calculation (min) (ACUTE ONLY): 25 min   Charges:   PT Evaluation $PT Eval Moderate Complexity: 1 Mod PT Treatments $Gait Training: 8-22 mins        [place] Pager:  [phone]  Office:  [place] 10/03/2018, 12:57 PM

## 2018-10-04 LAB — BASIC METABOLIC PANEL
Anion gap: 10 (ref 5–15)
BUN: 73 mg/dL — ABNORMAL HIGH (ref 8–23)
CO2: 24 mmol/L (ref 22–32)
Calcium: 8.4 mg/dL — ABNORMAL LOW (ref 8.9–10.3)
Chloride: 95 mmol/L — ABNORMAL LOW (ref 98–111)
Creatinine, Ser: 3.29 mg/dL — ABNORMAL HIGH (ref 0.44–1.00)
GFR calc Af Amer: 14 mL/min — ABNORMAL LOW (ref >60)
GFR calc non Af Amer: 12 mL/min — ABNORMAL LOW (ref >60)
Glucose, Bld: 180 mg/dL — ABNORMAL HIGH (ref 70–99)
Potassium: 4.5 mmol/L (ref 3.5–5.1)
Sodium: 129 mmol/L — ABNORMAL LOW (ref 135–145)

## 2018-10-04 LAB — PREPARE RBC (CROSSMATCH)

## 2018-10-04 LAB — IRON AND TIBC
Iron: 32 ug/dL (ref 28–170)
Saturation Ratios: 11 % (ref 10.4–31.8)
TIBC: 293 ug/dL (ref 250–450)
UIBC: 261 ug/dL

## 2018-10-04 LAB — CBC
HCT: 21 % — ABNORMAL LOW (ref 36.0–46.0)
Hemoglobin: 6.7 g/dL — CL (ref 12.0–15.0)
MCH: 30.6 pg (ref 26.0–34.0)
MCHC: 31.9 g/dL (ref 30.0–36.0)
MCV: 95.9 fL (ref 80.0–100.0)
Platelets: 371 10*3/uL (ref 150–400)
RBC: 2.19 MIL/uL — ABNORMAL LOW (ref 3.87–5.11)
RDW: 15.9 % — ABNORMAL HIGH (ref 11.5–15.5)
WBC: 11.9 10*3/uL — ABNORMAL HIGH (ref 4.0–10.5)
nRBC: 1.3 % — ABNORMAL HIGH (ref 0.0–0.2)

## 2018-10-04 LAB — NA AND K (SODIUM & POTASSIUM), RAND UR
Potassium Urine: 27 mmol/L
Sodium, Ur: 15 mmol/L

## 2018-10-04 LAB — GLUCOSE, CAPILLARY
Glucose-Capillary: 151 mg/dL — ABNORMAL HIGH (ref 70–99)
Glucose-Capillary: 131 mg/dL — ABNORMAL HIGH (ref 70–99)
Glucose-Capillary: 241 mg/dL — ABNORMAL HIGH (ref 70–99)
Glucose-Capillary: 202 mg/dL — ABNORMAL HIGH (ref 70–99)

## 2018-10-04 LAB — TSH: TSH: 43.781 u[IU]/mL — ABNORMAL HIGH (ref 0.350–4.500)

## 2018-10-04 LAB — OSMOLALITY, URINE: Osmolality, Ur: 416 mOsm/kg (ref 300–900)

## 2018-10-04 LAB — FERRITIN: Ferritin: 619 ng/mL — ABNORMAL HIGH (ref 11–307)

## 2018-10-04 MED ORDER — SODIUM CHLORIDE 0.9% IV SOLUTION
Freq: Once | INTRAVENOUS | Status: AC
  Administered 2018-10-04: 14:00:00 via INTRAVENOUS

## 2018-10-04 NOTE — Progress Notes (Addendum)
VAST consulted to place IV. Per unit nurse, pt had an IV for blood transfusion and pt pulled IV out after transfusion completed.  VAST RN educated it is in the patient's best interest to not place "just in case" IV to preserve veins for when they are needed, as well as decrease infection risk.Unit nurse stated pt needs IV because she is on telemetry unit. She doesn't have  IVF's or IV meds infusing. VAST RN contacting physician via secure chat to request leaving IV out.  1900 Spoke with Justice Rocher, unit RN regarding IV access for patient and having not heard back from secure chat with Cruzita Lederer, MD. Justice Rocher informed VAST RN that [facility] is covering for patient and AMION has to be utilized. Justice Rocher, RN helped VAST RN access and use AMION to page Tylene Fantasia.  VAST RN assessed pt's arms bilaterally. There are no superficial veins appropriate for IV placement at this time. Left arm with numerous bruises.  40 VAST RN spoke with night shift RN, Angelique. Advised that K. Baltazar Najjar has been paged regarding IV access. Informed that this VAST RN's phone will be passed along to colleague for answering return page from Tylene Fantasia and follow-up will be communicated. Angelique, RN verbalized understanding.

## 2018-10-04 NOTE — Care Management Important Message (Signed)
Important Message  Patient Details  Name: [name] [name] MRN: [id number] Date of Birth: 24-Dec-1932   Medicare Important Message Given:  Yes     Jaiven Graveline 10/04/2018, 1:26 PM

## 2018-10-04 NOTE — Progress Notes (Signed)
Physical Therapy Treatment Patient Details Name: [name] [name] MRN: [id number] DOB: 1932/10/29 Today's Date: 10/04/2018    History of Present Illness 85 year old female with history of hypertension, pulmonary hypertension, chronic respiratory failure on home oxygen, history of Wegener's (granulomatosis with polyangiitis), permanent A. fib, chronic diastolic CHF, chronic kidney disease stage III with baseline creatinine 1.7-2.5, who was admitted to the hospital on 09/14/2018 due to acute onset shortness of breath and hypoxia.  She was initially admitted to the ICU, placed on high-dose steroids.  Oxygenation improved and was transferred to the floor on the hospitalist service on 6/27    PT Comments    Limited session today due to low Hgb (6.7) and plans for transfusion of 1 unit PRBCs.  Pt coughing up blood sputum throughout our session, RN made aware and she reports she messaged MD.  BPs stable. O2 sats 87-88 on 4 L O2 [place], so turned up to 6L O2 [place] and pt maintained in the mid to low 90s.  Pt is much weaker than yesterday and a bit more confused.  I am hopeful the blood transfusion will help.  PT will continue to follow acutely for safe mobility progression   Follow Up Recommendations  Home health PT;Supervision/Assistance - 24 hour     Equipment Recommendations  Other (comment)(home O2)    Recommendations for Other Services   NA     Precautions / Restrictions Precautions Precautions: Fall    Mobility  Bed Mobility Overal bed mobility: Needs Assistance Bed Mobility: Supine to Sit     Supine to sit: Min assist     General bed mobility comments: Min assist to initiate movement towards EOB.  Min assist for movement of legs and support of trunk separately, pt using bed rail to pull up to sitting.   Transfers Overall transfer level: Needs assistance Equipment used: Rolling walker (2 wheeled) Transfers: Sit to/from [employer] Sit to Stand: Min assist Stand pivot  transfers: Min assist       General transfer comment: Heavy min assist to stand to RW and pivot to the recliner chair.  Gait deferred due to low Hgb levels 6.7 and MD ordering 1 unit PRBCs today.  BPs stable and HR stable.  Turned O2 up to 6L to maintain sats in the 90s.  Productive bloody sputum (RN made aware and MD messaged).    Ambulation/Gait             General Gait Details: deferred today due to low Hgb.           Balance Overall balance assessment: Needs assistance Sitting-balance support: Feet supported;Bilateral upper extremity supported Sitting balance-Leahy Scale: Fair Sitting balance - Comments: supervision EOB, pt flexed over her trunk, proped on her elbows.                                     Cognition Arousal/Alertness: Awake/alert Behavior During Therapy: WFL for tasks assessed/performed Overall Cognitive Status: Impaired/Different from baseline Area of Impairment: Memory                     Memory: Decreased short-term memory         General Comments: Pt is confused, reports son is coming to take her home, and then later said family is coming to "say good bye".              Pertinent Vitals/Pain Pain Assessment:  No/denies pain       Prior Function            PT Goals (current goals can now be found in the care plan section) Acute Rehab PT Goals Patient Stated Goal: to go home Progress towards PT goals: Not progressing toward goals - comment(weak)    Frequency    Min 3X/week      PT Plan Current plan remains appropriate       AM-PAC PT "6 Clicks" Mobility   Outcome Measure  Help needed turning from your back to your side while in a flat bed without using bedrails?: A Little Help needed moving from lying on your back to sitting on the side of a flat bed without using bedrails?: A Little Help needed moving to and from a bed to a chair (including a wheelchair)?: A Little Help needed standing up from a  chair using your arms (e.g., wheelchair or bedside chair)?: A Little Help needed to walk in hospital room?: A Little Help needed climbing 3-5 steps with a railing? : A Lot 6 Click Score: 17    End of Session Equipment Utilized During Treatment: Oxygen(6 L O2 [place]) Activity Tolerance: Patient limited by fatigue Patient left: in chair;with call bell/phone within reach;with chair alarm set Nurse Communication: Other (comment)(bloody sputum) PT Visit Diagnosis: Muscle weakness (generalized) (M62.81);Other abnormalities of gait and mobility (R26.89)     Time: 4098-1191 PT Time Calculation (min) (ACUTE ONLY): 25 min  Charges:  $Therapeutic Activity: 23-37 mins                    Zayla Agar B. Kyran Whittier, PT, DPT  Acute Rehabilitation [phone] pager [phone] office  @ [facility]: [phone]    10/04/2018, 12:36 PM

## 2018-10-04 NOTE — Progress Notes (Signed)
[facility] NEPHROLOGY PROGRESS NOTE  Assessment/ Plan: Pt is a 85 y.o. yo female with history of HTN, HLD, DM 2, CVA, chronic A. fib, history of carcinoid with metastatic disease, history of pulmonary Wegener's, breast CA admitted with shortness of breath and hypoxia thought to be due to CHF treated with IV diuretics, we are consulted for AKI on CKD.  #AKI on CKD likely hemodynamically mediated due to diuretics versus progressive CKD in the setting of CHF, hypertension and DM.  The urinalysis with RBC and some protein and ultrasound kidneys with bilateral atrophic kidney with cortical thinning and increased echogenicity.  I will quantify the proteinuria.  I do not think patient will benefit from kidney biopsy.  Check CKD labs including PTH, iron studies. -The baseline creatinine level around 1.6-1.9 recently in 04/2018.  Noted that creatinine has mildly gone up to 3.39 today.  Agree with holding diuretics.  She has no uremic features.  No indication for dialysis today.  #Hyponatremia: Check urine electrolytes, urine osmolality, TSH.  Recommend glycemic control.  Monitor labs.  #Hypertension: Blood pressure acceptable.  Continue current medication.  #Anemia due to CKD: Hemoglobin was 6.3 on admission.  Received a unit of blood transfusion.  Check iron studies.  Transfuse as needed.  # Hypoxic respiratory failure/ILD: Treated with steroid and antibiotics.  Per primary team.  Subjective: Seen and examined at bedside.  Anxious about kidney disease.  Denied headache, dizziness, chest pain.  No change in shortness of breath. Objective Vital signs in last 24 hours: Vitals:   10/04/18 0400 10/04/18 0500 10/04/18 0600 10/04/18 0811  BP: 137/89 132/84 133/81 137/80  Pulse: 80 68 66 67  Resp: (!) 23 18 19  (!) 24  Temp:    97.9 F (36.6 C)  TempSrc:    Oral  SpO2: 98% 100% 96% 96%  Weight:      Height:       Weight change: -1.3 kg  Intake/Output Summary (Last 24 hours) at  10/04/2018 0953 Last data filed at 10/03/2018 2000 Gross per 24 hour  Intake 240 ml  Output -  Net 240 ml       Labs: Basic Metabolic Panel: Recent Labs  Lab 09/09/2018 0918  09/30/18 0353 10/01/18 0345 10/03/18 0730  NA 134*   < > 134* 131* 129*  K 4.3   < > 4.4 4.4 4.7  CL 101  --  101 96* 93*  CO2 22  --  24 25 23   GLUCOSE 152*  --  102* 372* 185*  BUN 42*  --  39* 44* 64*  CREATININE 3.01*  --  2.96* 3.10* 3.39*  CALCIUM 8.2*  --  8.0* 8.0* 8.3*  PHOS 4.5  --  4.4 5.3*  --    < > = values in this interval not displayed.   Liver Function Tests: Recent Labs  Lab 09/08/2018 0145 09/18/2018 0918  AST 17 17  ALT 8 8  ALKPHOS 71 61  BILITOT 1.2 1.3*  PROT 6.9 6.9  ALBUMIN 2.9* 2.9*   No results for input(s): LIPASE, AMYLASE in the last 168 hours. No results for input(s): AMMONIA in the last 168 hours. CBC: Recent Labs  Lab 09/24/2018 0145  10/04/2018 0918 09/23/2018 1715 10/01/2018 1836 09/30/18 0353 10/01/18 0345  WBC 10.2  --  11.4* 8.5  --  8.3 7.3  NEUTROABS 8.3*  --   --   --   --   --  6.8  HGB 6.6*   < > 6.3*  7.6* 9.5* 7.4* 7.6*  HCT 21.9*   < > 19.3* 23.7* 28.0* 23.6* 24.2*  MCV 103.8*  --  98.0 98.8  --  99.6 99.6  PLT 399  --  351 335  --  313 308   < > = values in this interval not displayed.   Cardiac Enzymes: No results for input(s): CKTOTAL, CKMB, CKMBINDEX, TROPONINI in the last 168 hours. CBG: Recent Labs  Lab 10/03/18 0803 10/03/18 1206 10/03/18 1644 10/03/18 2109 10/04/18 0743  GLUCAP 173* 241* 208* 234* 151*    Iron Studies: No results for input(s): IRON, TIBC, TRANSFERRIN, FERRITIN in the last 72 hours. Studies/Results: US Renal  Result Date: 10/03/2018 CLINICAL DATA:  Stage IV chronic kidney disease, history breast cancer, atrial fibrillation, type II diabetes mellitus, pulmonary hypertension, hypertension EXAM: RENAL / URINARY TRACT ULTRASOUND COMPLETE COMPARISON:  CT abdomen and pelvis 06/30/2016 FINDINGS: Right Kidney: Renal  measurements: 8.8 x 5.0 x 4.5 cm = volume: 104 mL. Cortical thinning. Increased cortical echogenicity. No mass, hydronephrosis or shadowing calcification. Left Kidney: Renal measurements: 8.8 x 4.6 x 4.7 cm = volume: 99 mL. Cortical thinning. Increased cortical echogenicity. No mass, hydronephrosis or shadowing calcification. Bladder: Appears normal for degree of bladder distention. BILATERAL ureteral jets identified. IMPRESSION: Atrophy and medical renal disease changes of both kidneys. No renal mass or hydronephrosis. Electronically Signed   By: Ulyses Southward M.D.   On: 10/03/2018 22:09    Medications: Infusions:   Scheduled Medications: . amLODipine  5 mg Oral Daily  . apixaban  2.5 mg Oral BID  . azithromycin  250 mg Oral Daily  . Chlorhexidine Gluconate Cloth  6 each Topical Q0600  . famotidine  10 mg Oral BID  . insulin aspart  0-15 Units Subcutaneous TID WC  . insulin aspart  0-5 Units Subcutaneous QHS  . insulin glargine  5 Units Subcutaneous Q2200  . ipratropium-albuterol  3 mL Nebulization TID  . levothyroxine  100 mcg Oral QAC breakfast  . mouth rinse  15 mL Mouth Rinse BID  . nebivolol  10 mg Oral Daily  . predniSONE  40 mg Oral Q breakfast   Followed by  . [START ON 10/10/2018] predniSONE  20 mg Oral Q breakfast    have reviewed scheduled and prn medications.  Physical Exam: General:NAD, comfortable Heart:RRR, s1s2 nl, no rubs Lungs: Bibasilar coarse breath sound, no wheezing Abdomen:soft, Non-tender, non-distended Extremities:No edema Neurology: Alert awake and following commands.  No asterixis  [name] [name] [name] [name] 10/04/2018,9:53 AM  LOS: 5 days  Pager: [phone]

## 2018-10-04 NOTE — Progress Notes (Signed)
PROGRESS NOTE  [name] [name] BMW:[id number] DOB: [birth year] DOA: 09/20/2018 PCP: [name], MD   LOS: 5 days   Brief Narrative / Interim history: Is an 85 year old female with history of hypertension, pulmonary hypertension, chronic respiratory failure on home oxygen, history of Wegener's (granulomatosis with polyangiitis), permanent A. fib, chronic diastolic CHF, chronic kidney disease stage III with baseline creatinine 1.7-2.5, who was admitted to the hospital on 10/02/2018 due to acute onset shortness of breath and hypoxia.  She was initially admitted to the ICU, placed on high-dose steroids.  Oxygenation improved and was transferred to the floor on the hospitalist service on 6/27  Subjective: No complaints, asking to go home.  Denies any shortness of breath.  Denies any chest pain.  No abdominal pain, no nausea vomiting or diarrhea.  Reports no bleeding  Assessment & Plan: Principal Problem:   Acute on chronic respiratory failure with hypoxemia (HCC) Active Problems:   Hypothyroidism   Anemia   Diastolic CHF, chronic (HCC) - on low dose Lasix. ARB & BB along with Norvasc   Chronic atrial fibrillation (HCC) CHADS2-VASc=6.  On Corrected "low" dose of Eliquis   CKD (chronic kidney disease) stage 3, GFR 30-59 ml/min (HCC)   Principal Problem Acute on chronic hypoxic respiratory failure, granulomatosis with polyangiitis -Questionable component infectious process, patient was placed on azithromycin for total of 5 days -Was on Lasix initially and this has been discontinued on 6/27 -Continue oxygen, at home she is on 5 to 6 L with ambulation, currently 2-3 L at rest. Appears at baseline -Lower extremity Dopplers negative for DVT, CT angiogram was deferred due to renal failure  Active Problems Permanent atrial fibrillation -She is on chronic anticoagulation with Eliquis.  She has been on and off of Eliquis in the recent months due to intermittent epistaxis but currently taking it.  -Continue Eliquis.  She has no evidence of bleeding  Acute on chronic diastolic CHF with elevated BNP -Maintain net balance, clinically appears euvolemic right now.  She is net -1.5 L -Lasix on hold, appears slightly intravascularly depleted. Renal to see  Acute kidney injury on chronic kidney disease stage III -Baseline creatinine 1.7-2.5, currently slightly worsening at 3.3 -Avoid nephrotoxins, closely monitor -patient surprised to hear that she has CKD, Her Cr has been significantly higher during this hospitalization in the 3 range, 1.68 earlier this year.  -consulted nephrology, discussed with Dr. Carolin Sicks today  Hypertension -Blood pressure appears controlled, continue nebivolol  Hypothyroidism -Continue Synthroid  Anemia of chronic disease, CKD stage IV -No bleeding noted, she did require a unit of packed red blood cells on 6/24, hemoglobin again trending down today to 6.7 and will transfuse 1 additional unit.  No signs/symptoms of bleeding  Elevated troponin -Cardiology was consulted and followed patient.  She underwent a 2D echo on 10/03/2018 with normal LVEF of 55-60%.  Believed to be demand ischemia   Scheduled Meds: . sodium chloride   Intravenous Once  . amLODipine  5 mg Oral Daily  . apixaban  2.5 mg Oral BID  . azithromycin  250 mg Oral Daily  . Chlorhexidine Gluconate Cloth  6 each Topical Q0600  . famotidine  10 mg Oral BID  . insulin aspart  0-15 Units Subcutaneous TID WC  . insulin aspart  0-5 Units Subcutaneous QHS  . insulin glargine  5 Units Subcutaneous Q2200  . ipratropium-albuterol  3 mL Nebulization TID  . levothyroxine  100 mcg Oral QAC breakfast  . mouth rinse  15 mL Mouth  Rinse BID  . nebivolol  10 mg Oral Daily  . predniSONE  40 mg Oral Q breakfast   Followed by  . [START ON 10/10/2018] predniSONE  20 mg Oral Q breakfast   Continuous Infusions:  PRN Meds:.acetaminophen, ondansetron (ZOFRAN) IV  DVT prophylaxis: On Eliquis Code Status: Full  code Family Communication: Discussed with patient Disposition Plan: To be determined  Consultants:   Pulmonary critical care  Procedures:   2D echo:  IMPRESSIONS   1. The left ventricle has normal systolic function, with an ejection fraction of 55-60%. The cavity size was normal. There is mildly increased left ventricular wall thickness. Left ventricular diastolic Doppler parameters are indeterminate. No evidence  of left ventricular regional wall motion abnormalities. 2. The right ventricle has normal systolic function. The cavity was normal. There is no increase in right ventricular wall thickness. 3. Left atrial size was severely dilated. 4. Right atrial size was moderately dilated. 5. No evidence of mitral valve stenosis. Mild mitral regurgitation. 6. Tricuspid valve regurgitation is moderate-severe. 7. The aortic valve is tricuspid. Mild calcification of the aortic valve. Aortic valve regurgitation is mild by color flow Doppler. No stenosis of the aortic valve. 8. The aortic root is normal in size and structure. 9. There is mild dilatation of the ascending aorta measuring 41 mm. 10. The inferior vena cava was dilated in size with <50% respiratory variability. PA systolic pressure 68 mmHg. 11. The patient was in atrial fibrillation.  Antimicrobials:  Azithromycin   Objective: Vitals:   10/04/18 0400 10/04/18 0500 10/04/18 0600 10/04/18 0811  BP: 137/89 132/84 133/81 137/80  Pulse: 80 68 66 67  Resp: (!) _0 (!) 24  Temp:    97.9 F (36.6 C)  TempSrc:    Oral  SpO2: 98% 100% 96% 96%  Weight:      Height:        Intake/Output Summary (Last 24 hours) at 10/04/2018 1115 Last data filed at 10/03/2018 2000 Gross per 24 hour  Intake 240 ml  Output -  Net 240 ml   Filed Weights   10/02/18 0329 10/03/18 0300 10/04/18 0300  Weight: 66.6 kg 69.2 kg 67.9 kg    Examination:  Constitutional: No distress, pleasant ENMT: Moist mucous membranes Neck:  normal, supple Respiratory: No wheezing or crackles, good air movement bilaterally Cardiovascular: Regular rate and rhythm, no murmurs.  No peripheral edema Abdomen: Soft, nontender, nondistended, positive bowel sounds Musculoskeletal: no clubbing / cyanosis.  Skin: No rashes seen Neurologic: Nonfocal, equal strength   Data Reviewed: I have independently reviewed following labs and imaging studies   CBC: Recent Labs  Lab 10/02/2018 0145  09/11/2018 0918 09/26/2018 1715 09/14/2018 1836 09/30/18 0353 10/01/18 0345 10/04/18 0959  WBC 10.2  --  11.4* 8.5  --  8.3 7.3 11.9*  NEUTROABS 8.3*  --   --   --   --   --  6.8  --   HGB 6.6*   < > 6.3* 7.6* 9.5* 7.4* 7.6* 6.7*  HCT 21.9*   < > 19.3* 23.7* 28.0* 23.6* 24.2* 21.0*  MCV 103.8*  --  98.0 98.8  --  99.6 99.6 95.9  PLT 399  --  351 335  --  313 308 371   < > = values in this interval not displayed.   Basic Metabolic Panel: Recent Labs  Lab 09/09/2018 0918 09/06/2018 1715 09/14/2018 1836 09/30/18 0353 10/01/18 0345 10/03/18 0730 10/04/18 0959  NA 134*  --  135 134* 131*  129* 129*  K 4.3  --  4.2 4.4 4.4 4.7 4.5  CL 101  --   --  101 96* 93* 95*  CO2 22  --   --  _0 GLUCOSE 152*  --   --  102* 372* 185* 180*  BUN 42*  --   --  39* 44* 64* 73*  CREATININE 3.01*  --   --  2.96* 3.10* 3.39* 3.29*  CALCIUM 8.2*  --   --  8.0* 8.0* 8.3* 8.4*  MG QUANTITY NOT SUFFICIENT, UNABLE TO PERFORM TEST 2.5*  --  2.4 2.4  --   --   PHOS 4.5  --   --  4.4 5.3*  --   --    GFR: Estimated Creatinine Clearance: 11.9 mL/min (A) (by C-G formula based on SCr of 3.29 mg/dL (H)). Liver Function Tests: Recent Labs  Lab 09/26/2018 0145 09/24/2018 0918  AST 17 17  ALT 8 8  ALKPHOS 71 61  BILITOT 1.2 1.3*  PROT 6.9 6.9  ALBUMIN 2.9* 2.9*   No results for input(s): LIPASE, AMYLASE in the last 168 hours. No results for input(s): AMMONIA in the last 168 hours. Coagulation Profile: No results for input(s): INR, PROTIME in the last 168 hours.  Cardiac Enzymes: No results for input(s): CKTOTAL, CKMB, CKMBINDEX, TROPONINI in the last 168 hours. BNP (last 3 results) Recent Labs    03/22/18 1056  PROBNP 1,910.0*   HbA1C: No results for input(s): HGBA1C in the last 72 hours. CBG: Recent Labs  Lab 10/03/18 0803 10/03/18 1206 10/03/18 1644 10/03/18 2109 10/04/18 0743  GLUCAP 173* 241* 208* 234* 151*   Lipid Profile: No results for input(s): CHOL, HDL, LDLCALC, TRIG, CHOLHDL, LDLDIRECT in the last 72 hours. Thyroid Function Tests: No results for input(s): TSH, T4TOTAL, FREET4, T3FREE, THYROIDAB in the last 72 hours. Anemia Panel: No results for input(s): VITAMINB12, FOLATE, FERRITIN, TIBC, IRON, RETICCTPCT in the last 72 hours. Urine analysis:    Component Value Date/Time   COLORURINE YELLOW 09/18/2018 0539   APPEARANCEUR HAZY (A) 09/16/2018 0539   LABSPEC 1.013 09/16/2018 0539   PHURINE 5.0 09/09/2018 0539   GLUCOSEU NEGATIVE 10/05/2018 0539   HGBUR MODERATE (A) 09/07/2018 0539   BILIRUBINUR NEGATIVE 09/11/2018 0539   BILIRUBINUR n 12/30/2010 1700   KETONESUR NEGATIVE 09/30/2018 0539   PROTEINUR 100 (A) 09/06/2018 0539   UROBILINOGEN 1.0 09/25/2014 1550   NITRITE NEGATIVE 09/26/2018 [place] 09/08/2018 0539   Sepsis Labs: Invalid input(s): PROCALCITONIN, LACTICIDVEN  Recent Results (from the past 240 hour(s))  SARS Coronavirus 2 (CEPHEID- Performed in [place] hospital lab), Hosp Order     Status: None   Collection Time: 10/05/2018  2:33 AM   Specimen: Nasopharyngeal Swab  Result Value Ref Range Status   SARS Coronavirus 2 NEGATIVE NEGATIVE Final    Comment: (NOTE) If result is NEGATIVE SARS-CoV-2 target nucleic acids are NOT DETECTED. The SARS-CoV-2 RNA is generally detectable in upper and lower  respiratory specimens during the acute phase of infection. The lowest  concentration of SARS-CoV-2 viral copies this assay can detect is 250  copies / mL. A negative result does not  preclude SARS-CoV-2 infection  and should not be used as the sole basis for treatment or other  patient management decisions.  A negative result may occur with  improper specimen collection / handling, submission of specimen other  than nasopharyngeal swab, presence of viral mutation(s) within the  areas targeted by this  assay, and inadequate number of viral copies  (<250 copies / mL). A negative result must be combined with clinical  observations, patient history, and epidemiological information. If result is POSITIVE SARS-CoV-2 target nucleic acids are DETECTED. The SARS-CoV-2 RNA is generally detectable in upper and lower  respiratory specimens dur ing the acute phase of infection.  Positive  results are indicative of active infection with SARS-CoV-2.  Clinical  correlation with patient history and other diagnostic information is  necessary to determine patient infection status.  Positive results do  not rule out bacterial infection or co-infection with other viruses. If result is PRESUMPTIVE POSTIVE SARS-CoV-2 nucleic acids MAY BE PRESENT.   A presumptive positive result was obtained on the submitted specimen  and confirmed on repeat testing.  While 2019 novel coronavirus  (SARS-CoV-2) nucleic acids may be present in the submitted sample  additional confirmatory testing may be necessary for epidemiological  and / or clinical management purposes  to differentiate between  SARS-CoV-2 and other Sarbecovirus currently known to infect humans.  If clinically indicated additional testing with an alternate test  methodology [phone]) is advised. The SARS-CoV-2 RNA is generally  detectable in upper and lower respiratory sp ecimens during the acute  phase of infection. The expected result is Negative. Fact Sheet for Patients:  StrictlyIdeas.no Fact Sheet for Healthcare Providers: BankingDealers.co.za This test is not yet approved or cleared by  the [place] FDA and has been authorized for detection and/or diagnosis of SARS-CoV-2 by FDA under an Emergency Use Authorization (EUA).  This EUA will remain in effect (meaning this test can be used) for the duration of the COVID-19 declaration under Section 564(b)(1) of the Act, 21 U.S.C. section 360bbb-3(b)(1), unless the authorization is terminated or revoked sooner. Performed at [facility], [place] [address], [place], [place] [address]   MRSA PCR Screening     Status: None   Collection Time: 09/07/2018  8:17 AM   Specimen: Nasal Mucosa; Nasopharyngeal  Result Value Ref Range Status   MRSA by PCR NEGATIVE NEGATIVE Final    Comment:        The GeneXpert MRSA Assay (FDA approved for NASAL specimens only), is one component of a comprehensive MRSA colonization surveillance program. It is not intended to diagnose MRSA infection nor to guide or monitor treatment for MRSA infections. Performed at [facility], [place] [address], [place],  [address]       Radiology Studies: US Renal  Result Date: 10/03/2018 CLINICAL DATA:  Stage IV chronic kidney disease, history breast cancer, atrial fibrillation, type II diabetes mellitus, pulmonary hypertension, hypertension EXAM: RENAL / URINARY TRACT ULTRASOUND COMPLETE COMPARISON:  CT abdomen and pelvis 06/30/2016 FINDINGS: Right Kidney: Renal measurements: 8.8 x 5.0 x 4.5 cm = volume: 104 mL. Cortical thinning. Increased cortical echogenicity. No mass, hydronephrosis or shadowing calcification. Left Kidney: Renal measurements: 8.8 x 4.6 x 4.7 cm = volume: 99 mL. Cortical thinning. Increased cortical echogenicity. No mass, hydronephrosis or shadowing calcification. Bladder: Appears normal for degree of bladder distention. BILATERAL ureteral jets identified. IMPRESSION: Atrophy and medical renal disease changes of both kidneys. No renal mass or hydronephrosis. Electronically Signed   By: Lavonia Dana M.D.   On: 10/03/2018 22:09     Marzetta Board, MD, PhD [facility]  Contact via  [email or web address]  [place] P: [phone] F: [phone]

## 2018-10-04 NOTE — Progress Notes (Signed)
CRITICAL VALUE ALERT  Critical Value:  Hemoglobin (6.7)  Date & Time Notied:  10/04/2018 @ 1050  Provider Notified: Dr Cruzita Lederer @ 1105 am  Orders Received/Actions taken: orders were given to transfuse 1 unit of PRBC.

## 2018-10-05 ENCOUNTER — Inpatient Hospital Stay (HOSPITAL_COMMUNITY): Payer: Medicare Other

## 2018-10-05 DIAGNOSIS — J939 Pneumothorax, unspecified: Secondary | ICD-10-CM

## 2018-10-05 LAB — CBC WITH DIFFERENTIAL/PLATELET
Abs Immature Granulocytes: 0.22 10*3/uL — ABNORMAL HIGH (ref 0.00–0.07)
Basophils Absolute: 0 10*3/uL (ref 0.0–0.1)
Basophils Relative: 0 %
Eosinophils Absolute: 0 10*3/uL (ref 0.0–0.5)
Eosinophils Relative: 0 %
HCT: 21.8 % — ABNORMAL LOW (ref 36.0–46.0)
Hemoglobin: 7.1 g/dL — ABNORMAL LOW (ref 12.0–15.0)
Immature Granulocytes: 1 %
Lymphocytes Relative: 1 %
Lymphs Abs: 0.2 10*3/uL — ABNORMAL LOW (ref 0.7–4.0)
MCH: 30.5 pg (ref 26.0–34.0)
MCHC: 32.6 g/dL (ref 30.0–36.0)
MCV: 93.6 fL (ref 80.0–100.0)
Monocytes Absolute: 0.7 10*3/uL (ref 0.1–1.0)
Monocytes Relative: 4 %
Neutro Abs: 15.1 10*3/uL — ABNORMAL HIGH (ref 1.7–7.7)
Neutrophils Relative %: 94 %
Platelets: 310 10*3/uL (ref 150–400)
RBC: 2.33 MIL/uL — ABNORMAL LOW (ref 3.87–5.11)
RDW: 17.2 % — ABNORMAL HIGH (ref 11.5–15.5)
WBC: 16.2 10*3/uL — ABNORMAL HIGH (ref 4.0–10.5)
nRBC: 0.6 % — ABNORMAL HIGH (ref 0.0–0.2)

## 2018-10-05 LAB — RENAL FUNCTION PANEL
Albumin: 2.9 g/dL — ABNORMAL LOW (ref 3.5–5.0)
Anion gap: 11 (ref 5–15)
BUN: 79 mg/dL — ABNORMAL HIGH (ref 8–23)
CO2: 26 mmol/L (ref 22–32)
Calcium: 8.5 mg/dL — ABNORMAL LOW (ref 8.9–10.3)
Chloride: 96 mmol/L — ABNORMAL LOW (ref 98–111)
Creatinine, Ser: 3.14 mg/dL — ABNORMAL HIGH (ref 0.44–1.00)
GFR calc Af Amer: 15 mL/min — ABNORMAL LOW (ref >60)
GFR calc non Af Amer: 13 mL/min — ABNORMAL LOW (ref >60)
Glucose, Bld: 152 mg/dL — ABNORMAL HIGH (ref 70–99)
Phosphorus: 5 mg/dL — ABNORMAL HIGH (ref 2.5–4.6)
Potassium: 4.5 mmol/L (ref 3.5–5.1)
Sodium: 133 mmol/L — ABNORMAL LOW (ref 135–145)

## 2018-10-05 LAB — GLUCOSE, CAPILLARY
Glucose-Capillary: 139 mg/dL — ABNORMAL HIGH (ref 70–99)
Glucose-Capillary: 130 mg/dL — ABNORMAL HIGH (ref 70–99)
Glucose-Capillary: 93 mg/dL (ref 70–99)
Glucose-Capillary: 68 mg/dL — ABNORMAL LOW (ref 70–99)
Glucose-Capillary: 145 mg/dL — ABNORMAL HIGH (ref 70–99)

## 2018-10-05 LAB — BPAM RBC
Blood Product Expiration Date: 202007152359
ISSUE DATE / TIME: 202006291405
Unit Type and Rh: 6200

## 2018-10-05 LAB — POCT I-STAT 7, (LYTES, BLD GAS, ICA,H+H)
Bicarbonate: 26.3 mmol/L (ref 20.0–28.0)
Calcium, Ion: 1.23 mmol/L (ref 1.15–1.40)
HCT: 20 % — ABNORMAL LOW (ref 36.0–46.0)
Hemoglobin: 6.8 g/dL — CL (ref 12.0–15.0)
O2 Saturation: 88 %
Patient temperature: 97.6
Potassium: 4.3 mmol/L (ref 3.5–5.1)
Sodium: 133 mmol/L — ABNORMAL LOW (ref 135–145)
TCO2: 28 mmol/L (ref 22–32)
pCO2 arterial: 49.8 mmHg — ABNORMAL HIGH (ref 32.0–48.0)
pH, Arterial: 7.328 — ABNORMAL LOW (ref 7.350–7.450)
pO2, Arterial: 57 mmHg — ABNORMAL LOW (ref 83.0–108.0)

## 2018-10-05 LAB — PTH, INTACT AND CALCIUM
Calcium, Total (PTH): 8.3 mg/dL — ABNORMAL LOW (ref 8.7–10.3)
PTH: 113 pg/mL — ABNORMAL HIGH (ref 15–65)

## 2018-10-05 LAB — BLOOD GAS, ARTERIAL
Acid-Base Excess: 1.2 mmol/L (ref 0.0–2.0)
Bicarbonate: 26.9 mmol/L (ref 20.0–28.0)
Delivery systems: POSITIVE
Drawn by: 418751
Expiratory PAP: 6
FIO2: 80
Inspiratory PAP: 12
O2 Saturation: 95.6 %
Patient temperature: 98.6
RATE: 10 resp/min
pCO2 arterial: 55.6 mmHg — ABNORMAL HIGH (ref 32.0–48.0)
pH, Arterial: 7.306 — ABNORMAL LOW (ref 7.350–7.450)
pO2, Arterial: 85.1 mmHg (ref 83.0–108.0)

## 2018-10-05 LAB — TYPE AND SCREEN
ABO/RH(D): A POS
Antibody Screen: NEGATIVE
Unit division: 0

## 2018-10-05 LAB — PROTEIN / CREATININE RATIO, URINE
Creatinine, Urine: 45.17 mg/dL
Protein Creatinine Ratio: 1.66 mg/mg{Cre} — ABNORMAL HIGH (ref 0.00–0.15)
Total Protein, Urine: 75 mg/dL

## 2018-10-05 LAB — MRSA PCR SCREENING: MRSA by PCR: NEGATIVE

## 2018-10-05 LAB — MAGNESIUM: Magnesium: 2.4 mg/dL (ref 1.7–2.4)

## 2018-10-05 MED ORDER — MIDAZOLAM HCL 2 MG/2ML IJ SOLN
2.0000 mg | Freq: Once | INTRAMUSCULAR | Status: AC
  Administered 2018-10-05: 2 mg via INTRAVENOUS

## 2018-10-05 MED ORDER — MORPHINE SULFATE (PF) 4 MG/ML IV SOLN
INTRAVENOUS | Status: AC
  Filled 2018-10-05: qty 1

## 2018-10-05 MED ORDER — DEXTROSE 50 % IV SOLN
12.5000 g | INTRAVENOUS | Status: AC
  Administered 2018-10-05: 12.5 g via INTRAVENOUS

## 2018-10-05 MED ORDER — MIDAZOLAM HCL 2 MG/2ML IJ SOLN
INTRAMUSCULAR | Status: AC
  Administered 2018-10-05: 2 mg
  Filled 2018-10-05: qty 4

## 2018-10-05 MED ORDER — VECURONIUM BROMIDE 10 MG IV SOLR
INTRAVENOUS | Status: AC
  Filled 2018-10-05: qty 10

## 2018-10-05 MED ORDER — VITAL AF 1.2 CAL PO LIQD
1000.0000 mL | ORAL | Status: DC
  Administered 2018-10-05: 1000 mL

## 2018-10-05 MED ORDER — FENTANYL CITRATE (PF) 100 MCG/2ML IJ SOLN
INTRAMUSCULAR | Status: AC
  Administered 2018-10-05: 100 ug via INTRAVENOUS
  Filled 2018-10-05: qty 2

## 2018-10-05 MED ORDER — DEXMEDETOMIDINE HCL IN NACL 200 MCG/50ML IV SOLN
0.4000 ug/kg/h | INTRAVENOUS | Status: DC
  Administered 2018-10-05: 0.4 ug/kg/h via INTRAVENOUS
  Administered 2018-10-05: 0.3 ug/kg/h via INTRAVENOUS
  Filled 2018-10-05 (×2): qty 50

## 2018-10-05 MED ORDER — VECURONIUM BROMIDE 10 MG IV SOLR: 10.0000 mg | Freq: Once | INTRAVENOUS | Status: DC

## 2018-10-05 MED ORDER — FENTANYL 2500MCG IN NS 250ML (10MCG/ML) PREMIX INFUSION: 0.0000 ug/h | INTRAVENOUS | Status: DC

## 2018-10-05 MED ORDER — ROCURONIUM BROMIDE 50 MG/5ML IV SOLN
40.0000 mg | Freq: Once | INTRAVENOUS | Status: AC
  Administered 2018-10-05: 40 mg via INTRAVENOUS
  Filled 2018-10-05: qty 4

## 2018-10-05 MED ORDER — FENTANYL CITRATE (PF) 100 MCG/2ML IJ SOLN
100.0000 ug | Freq: Once | INTRAMUSCULAR | Status: AC
  Administered 2018-10-05: 100 ug via INTRAVENOUS

## 2018-10-05 MED ORDER — MIDAZOLAM HCL 2 MG/2ML IJ SOLN
INTRAMUSCULAR | Status: AC
  Administered 2018-10-05: 2 mg via INTRAVENOUS
  Filled 2018-10-05: qty 2

## 2018-10-05 MED ORDER — NOREPINEPHRINE 4 MG/250ML-% IV SOLN
INTRAVENOUS | Status: AC
  Administered 2018-10-05: 4 ug/min via INTRAVENOUS
  Filled 2018-10-05: qty 250

## 2018-10-05 MED ORDER — INSULIN ASPART 100 UNIT/ML ~~LOC~~ SOLN
0.0000 [IU] | SUBCUTANEOUS | Status: DC
  Administered 2018-10-05: 2 [IU] via SUBCUTANEOUS

## 2018-10-05 MED ORDER — IPRATROPIUM-ALBUTEROL 0.5-2.5 (3) MG/3ML IN SOLN
RESPIRATORY_TRACT | Status: AC
  Administered 2018-10-05: 3 mL via RESPIRATORY_TRACT
  Filled 2018-10-05: qty 3

## 2018-10-05 MED ORDER — DEXTROSE 50 % IV SOLN
INTRAVENOUS | Status: AC
  Filled 2018-10-05: qty 50

## 2018-10-05 MED ORDER — MIDAZOLAM HCL 2 MG/2ML IJ SOLN
INTRAMUSCULAR | Status: AC
  Administered 2018-10-05: 2 mg
  Filled 2018-10-05: qty 2

## 2018-10-05 MED ORDER — ORAL CARE MOUTH RINSE: 15.0000 mL | OROMUCOSAL | Status: DC

## 2018-10-05 MED ORDER — FAMOTIDINE 20 MG PO TABS
10.0000 mg | ORAL_TABLET | Freq: Two times a day (BID) | ORAL | Status: DC
  Administered 2018-10-05: 10 mg

## 2018-10-05 MED ORDER — VITAL HIGH PROTEIN PO LIQD: 1000.0000 mL | ORAL | Status: DC

## 2018-10-05 MED ORDER — FUROSEMIDE 10 MG/ML IJ SOLN
40.0000 mg | Freq: Once | INTRAMUSCULAR | Status: AC
  Administered 2018-10-05: 40 mg via INTRAVENOUS
  Filled 2018-10-05: qty 4

## 2018-10-05 MED ORDER — PRO-STAT SUGAR FREE PO LIQD
30.0000 mL | Freq: Two times a day (BID) | ORAL | Status: DC
  Administered 2018-10-05 (×2): 30 mL
  Filled 2018-10-05: qty 30

## 2018-10-05 MED ORDER — FUROSEMIDE 10 MG/ML IJ SOLN
80.0000 mg | Freq: Once | INTRAMUSCULAR | Status: AC
  Administered 2018-10-05: 80 mg via INTRAVENOUS

## 2018-10-05 MED ORDER — NOREPINEPHRINE 4 MG/250ML-% IV SOLN
0.0000 ug/min | INTRAVENOUS | Status: DC
  Administered 2018-10-05: 4 ug/min via INTRAVENOUS
  Filled 2018-10-05: qty 250

## 2018-10-05 MED ORDER — ONDANSETRON HCL 4 MG/2ML IJ SOLN
4.0000 mg | Freq: Once | INTRAMUSCULAR | Status: AC
  Administered 2018-10-05: 4 mg via INTRAVENOUS

## 2018-10-05 MED ORDER — FUROSEMIDE 10 MG/ML IJ SOLN
INTRAMUSCULAR | Status: AC
  Filled 2018-10-05: qty 4

## 2018-10-05 MED ORDER — PRO-STAT SUGAR FREE PO LIQD
30.0000 mL | Freq: Two times a day (BID) | ORAL | Status: DC
  Filled 2018-10-05: qty 30

## 2018-10-05 MED ORDER — ETOMIDATE 2 MG/ML IV SOLN
20.0000 mg | Freq: Once | INTRAVENOUS | Status: AC
  Administered 2018-10-05: 20 mg via INTRAVENOUS

## 2018-10-05 MED ORDER — SODIUM CHLORIDE 0.9 % IV SOLN
3.0000 g | Freq: Two times a day (BID) | INTRAVENOUS | Status: DC
  Administered 2018-10-05 (×2): 3 g via INTRAVENOUS
  Filled 2018-10-05 (×4): qty 8

## 2018-10-05 MED ORDER — CHLORHEXIDINE GLUCONATE 0.12% ORAL RINSE (MEDLINE KIT)
15.0000 mL | Freq: Two times a day (BID) | OROMUCOSAL | Status: DC
  Administered 2018-10-05: 15 mL via OROMUCOSAL

## 2018-10-05 MED ORDER — FENTANYL CITRATE (PF) 100 MCG/2ML IJ SOLN
INTRAMUSCULAR | Status: AC
  Administered 2018-10-05: 200 ug
  Filled 2018-10-05: qty 4

## 2018-10-05 NOTE — Progress Notes (Signed)
Name: [name] [name] MRN: [id number] DOB: [birth year] Date of service : 10/05/2018 Cc: SOB   LOS: 6  [place] [facility] Critical Care Note   History of Present Illness: 85 yo female with hx of HTN, pulm HTH, chronic resp failure on home oxygen, hx of wegeners, afib on eliquis, hx of colon cancer who presented from home today with worsening shortness of breath for a day. Her husband called EMS , she arrived in the ED with sats in the 50's , improved with NRB and then placed on BIPAP. During my evaluation the patient is on BIPAP at 40%, sats are normal she is awake and able to say some words, says she feels much better.  ROS+ N/V , no fevers or chills , no sputum production, denies any chest pain. 6/30 transferred to ICU in acute rep distress.   Lines / Drains: PIV 6/30 rt fem cvl>>  Cultures:     Antibiotics:  6/24azithromycin>>   Tests / Events: COVID19 negative 09/22/2018 2D echo reveals 60% ejection fraction with significant left atrial dilatation. 6/30 transferred to ICU in acute rep distress.  Vital Signs: Temp:  [97.3 F (36.3 C)-98.2 F (36.8 C)] 97.6 F (36.4 C) (06/30 0309) Pulse Rate:  [60-123] 76 (06/30 0735) Resp:  [8-36] 35 (06/30 0735) BP: (125-149)/(76-96) 139/82 (06/30 0309) SpO2:  [86 %-100 %] 87 % (06/30 0735) FiO2 (%):  [100 %] 100 % (06/30 0735) I/O last 3 completed shifts: In: 555 [P.O.:240; Blood:315] Out: 300 [Urine:300]  Physical Examination: .General:  Frail elderly female in resp ditress HEENT: Bipap in place Neuro: followscommands CV: HSIR IR afib cvr PULM: even/non-labored, lungs bilaterally diminshed MV:HQIO, non-tender, bsx4 active  Extremities: warm/dry, +edema  Skin: no rashes or lesions     Ventilator settings: Vent Mode: BIPAP FiO2 (%):  [100 %] 100 % Set Rate:  [10 bmp] 10 bmp PEEP:  [8 cmH20] 8 cmH20  Labs    CBC Recent Labs  Lab 09/30/18 0353 10/01/18 0345 10/04/18 0959  HGB 7.4* 7.6* 6.7*  HCT 23.6* 24.2* 21.0*   WBC 8.3 7.3 11.9*  PLT 313 308 371     BMET Recent Labs  Lab 10/01/2018 0918 09/13/2018 1715 09/17/2018 1836 09/30/18 0353 10/01/18 0345 10/03/18 0730 10/04/18 0959  NA 134*  --  135 134* 131* 129* 129*  K 4.3  --  4.2 4.4 4.4 4.7 4.5  CL 101  --   --  101 96* 93* 95*  CO2 22  --   --  24 25 23 24   GLUCOSE 152*  --   --  102* 372* 185* 180*  BUN 42*  --   --  39* 44* 64* 73*  CREATININE 3.01*  --   --  2.96* 3.10* 3.39* 3.29*  CALCIUM 8.2*  --   --  8.0* 8.0* 8.3* 8.4*  MG QUANTITY NOT SUFFICIENT, UNABLE TO PERFORM TEST 2.5*  --  2.4 2.4  --   --   PHOS 4.5  --   --  4.4 5.3*  --   --     No results for input(s): INR in the last 168 hours.  Recent Labs  Lab 09/26/2018 0841 09/16/2018 1836 10/05/18 0450  PHART 7.290* 7.292* 7.306*  PCO2ART 51.1* 52.0* 55.6*  PO2ART 100.0 85.0 85.1  HCO3 24.8 25.4 26.9  TCO2 26 27  --   O2SAT 97.0 96.0 95.6     US Renal  Result Date: 10/03/2018 CLINICAL DATA:  Stage IV chronic kidney disease, history  breast cancer, atrial fibrillation, type II diabetes mellitus, pulmonary hypertension, hypertension EXAM: RENAL / URINARY TRACT ULTRASOUND COMPLETE COMPARISON:  CT abdomen and pelvis 06/30/2016 FINDINGS: Right Kidney: Renal measurements: 8.8 x 5.0 x 4.5 cm = volume: 104 mL. Cortical thinning. Increased cortical echogenicity. No mass, hydronephrosis or shadowing calcification. Left Kidney: Renal measurements: 8.8 x 4.6 x 4.7 cm = volume: 99 mL. Cortical thinning. Increased cortical echogenicity. No mass, hydronephrosis or shadowing calcification. Bladder: Appears normal for degree of bladder distention. BILATERAL ureteral jets identified. IMPRESSION: Atrophy and medical renal disease changes of both kidneys. No renal mass or hydronephrosis. Electronically Signed   By: Ulyses Southward M.D.   On: 10/03/2018 22:09   Dg Chest Port 1 View  Result Date: 10/05/2018 CLINICAL DATA:  Dyspnea EXAM: PORTABLE CHEST 1 VIEW COMPARISON:  10/01/2018 FINDINGS: Persistent  cardiomegaly is noted. Aortic calcifications are seen. Increasing bilateral infiltrates are noted when compare with the prior study. No sizable effusion is noted. No pneumothorax is seen. IMPRESSION: Increasing bilateral infiltrates. Electronically Signed   By: Alcide Clever M.D.   On: 10/05/2018 07:31       Assessment and Plan: Acute on chronic hypercarbic respiratory failure in the setting of chronic home O2 use elevated BNP complicated by elevated creatinine.  Questionable component of infectious process. On chronic home O2  Bipap May need intubation Keep sats >92 % Hold anticoagualtion Diuresis as tolerated  History of congestive heart failure with elevated BNP Elevated troponins History of atrial fibrillation Cardiology consult appreciated HOLD ANTICOAGULATION    Acute kidney injury in the setting of chronic kidney disease 3 Lab Results  Component Value Date   CREATININE 3.29 (H) 10/04/2018   CREATININE 3.39 (H) 10/03/2018   CREATININE 3.10 (H) 10/01/2018   CREATININE 1.61 (H) 10/07/2016   CREATININE 1.8 (H) 02/08/2013   CREATININE 1.8 (H) 12/08/2012   CREATININE 1.8 (H) 12/01/2012   Recent Labs  Lab 10/01/18 0345 10/03/18 0730 10/04/18 0959  K 4.4 4.7 4.5   Diuresis as tolerated Avoid nephrotoxins Renal function not significantly worse over the last 2 days  Anemia in the setting of chronic anticoagulation for atrial fibrillation Recent Labs    10/04/18 0959  HGB 6.7*    Hold anticoagulation Transfuse per protocol Noted to have bloody emesis during the night of 10/05/2018  Full CODE STATUS 10/05/2018 CODE STATUS again does not need a full code after discussion with son after acute decompensation during the night. DVT prophylaxis with sequential hose, she has been restarted on Eliquis GI prophylaxis with Pepcid Diet currently n.p.o Son updated 0700 hrs. 10/05/2018 confirmed full CODE STATUS transfer to intensive care unit for central line placement and  possible intubation.  He was informed of all complications that can occur with invasive procedures and gave verbal consent for intubation and central line placement.   Brett Canales Jaishawn Witzke ACNP [facility] P[facility] Pager [phone] till 1 pm If no answer page [phone] 10/05/2018, 7:56 AM

## 2018-10-05 NOTE — Progress Notes (Signed)
Patient transported from 2W to room 2H23, on bipap, without complications.

## 2018-10-05 NOTE — Progress Notes (Signed)
PCCM Interval Note   Called to bedside around [phone] reference to worsening respiratory distress and hypoxia.  Per RN patient with worsening O2 requirements overnight, was still desaturating on HFNC at 15L.  BiPAP was put off reportedly due to episode of bloody emesis.  Patient is on eliquis, last dose last evening.  Eventually placed on BiPAP since around 0400.  Additionally, patient has no PIV access.  PIV lost earlier in the night and IV team unable re-establish.    No am labs- unable to get blood thus far CXR just performed with worsening bilateral dense opacities compared to 6/26  On exam, patient states "I cant breathe, take this off"  General:  Ill appearing elderly female in distress on BiPAP HEENT: ill fitting full face mask with leak Neuro:  Lethargic but awakens, able to follow simple commands CV: irir PULM: even/labored with accessory muscle use and tachypnea on BiPAP, rales RUL and bibasilar Extremities: warm/dry, no edema   Blood pressure 139/82, pulse 75, temperature 97.6 F (36.4 C), temperature source Oral, resp. rate (!) 36, height _0  (1.676 m), weight 67.9 kg, SpO2 100 %.   P:  EJ attempted x 2 without success.  Will need stat transfer to ICU with emergent central line placement and then intubation.    I called and spoke with patient's son, Louie Casa given her decompensation overnight and updated him.  He confirms that prior that she was a partial code but she recently verbalized she wanted everything done.  We discussed central line placement and intubation and complications of each.  He wishes for her to remain a full code for now.    Sign-out given to daytime PCCM team at bedside.    Kennieth Rad, MSN, AGACNP-BC [facility] Pgr: [phone] or if no answer [phone] 10/05/2018, 7:38 AM

## 2018-10-05 NOTE — Progress Notes (Signed)
OT Cancellation Note  Patient Details Name: [name] [name] MRN: [id number] DOB: 11/15/1932   Cancelled Treatment:    Reason Eval/Treat Not Completed: Medical issues which prohibited therapy.  Pt transferred to [facility] due to respiratory distress.   Will check back for appropriateness.  Lucille Passy, OTR/L [place] Pager [phone] Office [phone]   Lucille Passy M 10/05/2018, 8:03 AM

## 2018-10-05 NOTE — Progress Notes (Signed)
Initial Nutrition Assessment  DOCUMENTATION CODES:   Not applicable  INTERVENTION:   Tube feeding:  -Vital AF 1.2 @ 40 ml/hr iva OGT (960 ml) -30 ml Prostat BID  Provides: 1352  kcals, 102 grams protein, 779 ml free water. Meets 109% kcal needs and 100% protein needs.   NUTRITION DIAGNOSIS:   Inadequate oral intake related to inability to eat as evidenced by NPO status.   GOAL:   Provide needs based on ASPEN/SCCM guidelines  MONITOR:   Diet advancement, Vent status, Labs, I & O's, Skin, TF tolerance, Weight trends  REASON FOR ASSESSMENT:   Consult Enteral/tube feeding initiation and management  ASSESSMENT:   Patient with PMH significant for HTN, pulmonary HTN, Wegener's (granulomatosis with polyangiitis), CHF, CKD III, colon cancer, and CVA. Presents this admission with decompensated CHF.   Pt discussed during ICU rounds and with RN. Pt failed BiPAP this am, s/p intubation. CT scan revealed pulmonary congestion.   Per chart records, weight noted to fluctuate from 65-70 kg over the last year. Will use EDW of 64.9 kg to estimate needs this admission.   RD performed limited NFPE due to agitation. Pt shows mild to moderate muscle wasting. Suspect edema may be masking losses.   Patient is currently intubated on ventilator support MV: 9.2 L/min Temp (24hrs), Avg:97.7 F (36.5 C), Min:97.3 F (36.3 C), Max:98.2 F (36.8 C)   I/O: -1,859 ml since admit UOP: 300 ml x 24 hrs   Drips: precedex, levophed Medications: SS novolog, lantus, prednisone Labs: Na 129 (L) CBG 131-257  NUTRITION - FOCUSED PHYSICAL EXAM:    Most Recent Value  Orbital Region  No depletion  Upper Arm Region  Unable to assess  Thoracic and Lumbar Region  Unable to assess  Buccal Region  Unable to assess  Temple Region  Moderate depletion  Clavicle Bone Region  Moderate depletion  Clavicle and Acromion Bone Region  Moderate depletion  Scapular Bone Region  Unable to assess  Dorsal Hand   Unable to assess  Patellar Region  Unable to assess  Anterior Thigh Region  Unable to assess  Posterior Calf Region  Unable to assess  Edema (RD Assessment)  Unable to assess  Hair  Unable to assess  Eyes  Unable to assess  Mouth  Unable to assess  Skin  Unable to assess  Nails  Unable to assess     Diet Order:   Diet Order            Diet NPO time specified  Diet effective now              EDUCATION NEEDS:   Not appropriate for education at this time  Skin:  Skin Assessment: Reviewed RN Assessment  Last BM:  6/27  Height:   Ht Readings from Last 1 Encounters:  10/05/18 _0  (1.676 m)    Weight:   Wt Readings from Last 1 Encounters:  10/05/18 70 kg    Ideal Body Weight:  59.1 kg  BMI:  Body mass index is 24.91 kg/m.  Estimated Nutritional Needs:   Kcal:  1240 kcal  Protein:  90-105 gram  Fluid:  >/= 1.3 L/day   Mariana Single RD, LDN Clinical Nutrition Pager # - [phone]

## 2018-10-05 NOTE — Progress Notes (Signed)
Pt RR 35 with O2 sat in low 80's on NRB mask. RT placed pt on BiPAP at this time and obtained ABG. RN aware, RT to continue to monitor as needed.

## 2018-10-05 NOTE — Progress Notes (Signed)
Spoke on the phone with her son Marden Noble. He reiterated what Audry Pili had stated earlier, that if she isn't going to have any quality of like she would not want to live. He feels strongly that she wouldn't want to be kept alive on machines. He stated that he and his brothers would be together around 1730-1800 and asked if the doctor could call them and talk about the plan of care, specifically about removing life support. Dr.Olalere notified of their request and phone numbers provided.

## 2018-10-05 NOTE — Procedures (Signed)
Endotracheal Intubation Procedure Note  Indication for endotracheal intubation: respiratory failure. Airway Assessment: Mallampati Class: I (soft palate, uvula, fauces, and tonsillar pillars visible). Sedation: etomidate, fentanyl and midazolam. Etomidate 20, fentanyl 100, versed 2 Paralytic: rocuronium.-21m Lidocaine: no. Atropine: no. Equipment: Macintosh 3 laryngoscope blade and 7.549mcuffed endotracheal tube. Cricoid Pressure: no. Number of attempts: 1. ETT location confirmed by by auscultation, by CXR and ETCO2 monitor.  [name] [name] [name] [name] 10/05/2018

## 2018-10-05 NOTE — Progress Notes (Signed)
[facility] NEPHROLOGY PROGRESS NOTE  Assessment/ Plan: Pt is a 85 y.o. yo female with history of HTN, HLD, DM 2, CVA, chronic A. fib, history of carcinoid with metastatic disease, history of pulmonary Wegener's, breast CA admitted with shortness of breath and hypoxia thought to be due to CHF treated with IV diuretics, we are consulted for AKI on CKD.  #AKI on CKD likely hemodynamically mediated due to CHF versus progressive CKD.  The urinalysis with RBC and some protein and ultrasound kidneys with bilateral atrophic kidney with cortical thinning and increased echogenicity.  I will quantify the proteinuria.  I do not think patient will benefit from kidney biopsy.  Check CKD labs including PTH, iron studies. -The baseline creatinine level around 1.6-1.9 recently in 04/2018.  Patient is now transferred to ICU for respiratory distress.  Blood pressure noted to be 70s systolic initially.  She received a dose of Lasix.  Creatinine level stable at 3.14 today and patient is nonoliguric.  Monitor kidney function.  #Acute respiratory distress due to aspiration pneumonia.  On antibiotics per pulmonary.  Patient is intubated. Steroid for ILD.  #Hyponatremia: TSH level elevated to 43, informed to Dr. Elvera Lennox.  Sodium level improved to 133.  #Hypotension after intubation: Currently on Levophed.  Monitor blood pressure.  Discontinue Norvasc.  #Anemia due to CKD: Hemoglobin was 6.3 on admission.  Received a unit of blood transfusion.  Hemoglobin again dropped to 6.8 today meant to transfuse in ICU.  Iron saturation low, will not start IV iron because of infection.  Subjective: Seen and examined in ICU.  Overnight patient had respiratory distress and was moved to ICU.  She is sedated therefore unable to obtain review of system.  The urinary bag has around 350 cc of urine. Objective Vital signs in last 24 hours: Vitals:   10/05/18 0935 10/05/18 1000 10/05/18 1050 10/05/18 1107  BP:  120/70 93/61  (!) 78/56  Pulse:  80  66  Resp:  (!) 21  20  Temp:      TempSrc:      SpO2: 100% 98%  97%  Weight:      Height:       Weight change:   Intake/Output Summary (Last 24 hours) at 10/05/2018 1154 Last data filed at 10/04/2018 1842 Gross per 24 hour  Intake 315 ml  Output 300 ml  Net 15 ml       Labs: Basic Metabolic Panel: Recent Labs  Lab 09/30/18 0353 10/01/18 0345 10/03/18 0730 10/04/18 0959 10/05/18 1041 10/05/18 1124  NA 134* 131* 129* 129* 133* 133*  K 4.4 4.4 4.7 4.5 4.5 4.3  CL 101 96* 93* 95* 96*  --   CO2 24 25 23 24 26   --   GLUCOSE 102* 372* 185* 180* 152*  --   BUN 39* 44* 64* 73* 79*  --   CREATININE 2.96* 3.10* 3.39* 3.29* 3.14*  --   CALCIUM 8.0* 8.0* 8.3* 8.4* 8.5*  --   PHOS 4.4 5.3*  --   --  5.0*  --    Liver Function Tests: Recent Labs  Lab October 26, 2018 0145 10/26/2018 0918 10/05/18 1041  AST 17 17  --   ALT 8 8  --   ALKPHOS 71 61  --   BILITOT 1.2 1.3*  --   PROT 6.9 6.9  --   ALBUMIN 2.9* 2.9* 2.9*   No results for input(s): LIPASE, AMYLASE in the last 168 hours. No results for input(s): AMMONIA in the last  168 hours. CBC: Recent Labs  Lab 09/12/2018 0145  09/06/2018 1715  09/30/18 0353 10/01/18 0345 10/04/18 0959 10/05/18 0940 10/05/18 1124  WBC 10.2   < > 8.5  --  8.3 7.3 11.9* PENDING  --   NEUTROABS 8.3*  --   --   --   --  6.8  --  PENDING  --   HGB 6.6*   < > 7.6*   < > 7.4* 7.6* 6.7* 7.1* 6.8*  HCT 21.9*   < > 23.7*   < > 23.6* 24.2* 21.0* 21.8* 20.0*  MCV 103.8*   < > 98.8  --  99.6 99.6 95.9 93.6  --   PLT 399   < > 335  --  313 308 371 310  --    < > = values in this interval not displayed.   Cardiac Enzymes: No results for input(s): CKTOTAL, CKMB, CKMBINDEX, TROPONINI in the last 168 hours. CBG: Recent Labs  Lab 10/04/18 0743 10/04/18 1153 10/04/18 1558 10/04/18 2104 10/05/18 0909  GLUCAP 151* 131* 241* 202* 139*    Iron Studies:  Recent Labs    10/04/18 0959  IRON 32  TIBC 293  FERRITIN 619*    Studies/Results: US Renal  Result Date: 10/03/2018 CLINICAL DATA:  Stage IV chronic kidney disease, history breast cancer, atrial fibrillation, type II diabetes mellitus, pulmonary hypertension, hypertension EXAM: RENAL / URINARY TRACT ULTRASOUND COMPLETE COMPARISON:  CT abdomen and pelvis 06/30/2016 FINDINGS: Right Kidney: Renal measurements: 8.8 x 5.0 x 4.5 cm = volume: 104 mL. Cortical thinning. Increased cortical echogenicity. No mass, hydronephrosis or shadowing calcification. Left Kidney: Renal measurements: 8.8 x 4.6 x 4.7 cm = volume: 99 mL. Cortical thinning. Increased cortical echogenicity. No mass, hydronephrosis or shadowing calcification. Bladder: Appears normal for degree of bladder distention. BILATERAL ureteral jets identified. IMPRESSION: Atrophy and medical renal disease changes of both kidneys. No renal mass or hydronephrosis. Electronically Signed   By: Ulyses Southward M.D.   On: 10/03/2018 22:09   Dg Chest Port 1 View  Result Date: 10/05/2018 CLINICAL DATA:  Intubation EXAM: PORTABLE CHEST 1 VIEW COMPARISON:  10/05/2018 FINDINGS: Endotracheal tube in good position. Severe diffuse bilateral airspace disease mildly improved from earlier today. No significant pleural effusion NG tube placed.  The side hole in the distal esophagus. IMPRESSION: Endotracheal tube in good position. Severe diffuse bilateral airspace disease improved from this morning NG side hole in the distal esophagus. Electronically Signed   By: Marlan Palau M.D.   On: 10/05/2018 11:24   Dg Chest Port 1 View  Result Date: 10/05/2018 CLINICAL DATA:  Dyspnea EXAM: PORTABLE CHEST 1 VIEW COMPARISON:  10/01/2018 FINDINGS: Persistent cardiomegaly is noted. Aortic calcifications are seen. Increasing bilateral infiltrates are noted when compare with the prior study. No sizable effusion is noted. No pneumothorax is seen. IMPRESSION: Increasing bilateral infiltrates. Electronically Signed   By: Alcide Clever M.D.   On: 10/05/2018  07:31    Medications: Infusions: . ampicillin-sulbactam (UNASYN) IV 3 g (10/05/18 1037)  . dexmedetomidine (PRECEDEX) IV infusion 0.4 mcg/kg/hr (10/05/18 1034)  . feeding supplement (VITAL AF 1.2 CAL)    . norepinephrine (LEVOPHED) Adult infusion 10 mcg/min (10/05/18 1133)    Scheduled Medications: . amLODipine  5 mg Oral Daily  . Chlorhexidine Gluconate Cloth  6 each Topical Q0600  . famotidine  10 mg Oral BID  . feeding supplement (PRO-STAT SUGAR FREE 64)  30 mL Per Tube BID  . insulin aspart  0-15 Units Subcutaneous Q4H  .  insulin glargine  5 Units Subcutaneous Q2200  . ipratropium-albuterol  3 mL Nebulization TID  . levothyroxine  100 mcg Oral QAC breakfast  . mouth rinse  15 mL Mouth Rinse BID  . nebivolol  10 mg Oral Daily  . predniSONE  40 mg Oral Q breakfast   Followed by  . [START ON 10/10/2018] predniSONE  20 mg Oral Q breakfast    have reviewed scheduled and prn medications.  Physical Exam: General: Sedated and intubated Heart:RRR, s1s2 nl, no rubs Lungs: Mechanical coarse breath sound, no wheezing Abdomen:soft, Non-tender, non-distended Extremities:No edema Neurology: Alert awake and following commands.  No asterixis  Fredonia Casalino Prasad Shey Yott 10/05/2018,11:54 AM  LOS: 6 days  Pager: [phone]

## 2018-10-05 NOTE — Procedures (Signed)
Central Venous Catheter Insertion Procedure Note [name] [name] [id number] 10-Apr-1932  Procedure: Insertion of Central Venous Catheter Indications: Drug and/or fluid administration  Procedure Details Consent: Risks of procedure as well as the alternatives and risks of each were explained to the (patient/caregiver).  Consent for procedure obtained. Time Out: Verified patient identification, verified procedure, site/side was marked, verified correct patient position, special equipment/implants available, medications/allergies/relevent history reviewed, required imaging and test results available.  Performed  Maximum sterile technique was used including antiseptics, cap, gloves, gown, hand hygiene, mask and sheet. Skin prep: Chlorhexidine; local anesthetic administered. Right femoral vein visualized via ultrasound.  A antimicrobial bonded/coated triple lumen catheter was placed in the right femoral vein due to emergent situation using the Seldinger technique.  Evaluation Blood flow good Complications: No apparent complications Patient did tolerate procedure well.   [name] [name] 10/05/2018, 9:38 AM

## 2018-10-05 NOTE — Progress Notes (Signed)
Pharmacy Antibiotic Note  [name] [name] is a 85 y.o. female admitted on 09/18/2018 with aspiration PNA.  Pharmacy has been consulted for Unasyn dosing.  Plan: Unasyn 3g IV q 12 hrs Watch renal function closely and will adjust as needed.  Height: 5' 6" (167.6 cm) Weight: 149 lb 11.1 oz (67.9 kg) IBW/kg (Calculated) : 59.3  Temp (24hrs), Avg:97.7 F (36.5 C), Min:97.3 F (36.3 C), Max:98.2 F (36.8 C)  Recent Labs  Lab 09/25/2018 0917 09/20/2018 0918 09/25/2018 1656 10/01/2018 1715 09/30/18 0353 10/01/18 0345 10/03/18 0730 10/04/18 0959  WBC  --  11.4*  --  8.5 8.3 7.3  --  11.9*  CREATININE  --  3.01*  --   --  2.96* 3.10* 3.39* 3.29*  LATICACIDVEN 1.0  --  1.2  --   --   --   --   --     Estimated Creatinine Clearance: 11.9 mL/min (A) (by C-G formula based on SCr of 3.29 mg/dL (H)).    Allergies  Allergen Reactions  . Codeine Sulfate Other (See Comments)    REACTION: GI upset  . Sulfonamide Derivatives Other (See Comments)    REACTION: GI upset  . Vancomycin Other (See Comments)    Red man syndrome  . Atarax [Hydroxyzine] Nausea Only and Rash    Antimicrobials this admission: Azith 6/24 >> (6/30) Atovaquone for PCP px 6/26 >>6/27 Unasyn 6/30  Dose adjustments this admission:  Microbiology results: COVID negative MRSA pcr negative Strep pneumo negative Legionella - negative  Thank you for allowing pharmacy to be a part of this patient's care.  Marguerite Olea, [facility] Clinical Pharmacist Phone [phone]  10/05/2018 10:08 AM

## 2018-10-05 NOTE — Progress Notes (Signed)
PCCM Interval Note   Called emergently to bedside at 2237 reference to acute onset of high peak pressures, difficulty bagging with BVM and acute onset of diffuse SQ air on chest and neck.  Dr. Duwayne Heck at bedside as well with me.    See Dr. Duwayne Heck note for procedures.  While she emergently needle decompressed right chest for acute tension pneumothorax, I called the family to give update and to discuss if they wished to pursue large bore chest tube in which she needs given there has been much discussion on wether to continue aggressive care vs changing to comfort care.   I was able to reach [name] [name] (who currently is at [employer]), other two brothers are local.   We discussed the urgency of the situation and possible options of placing chest tube which would be painful but life saving vs continuing current care vs transitioning to comfort care.  [name] [name] told me he did not wish for [place] to place a large chest tube as he does not want his mother to suffer any more.  He was going to call his brothers and call me back with the plan.   While waiting for [name] [name] to call me back, patient continued to decompensate despite needle decompression, insertion of cook catheter by Dr. Duwayne Heck, and max dose levophed.  While calling [name] [name] back as I had not heard from him, at 2309, patient went into PEA cardiac arrest.  CPR was started at 2310 and epi given.    I was on the phone discussing events with [name] [name] during code.  He verified that he had talked to his brothers and they confirmed they would not want her to suffer any more and [name] [name] said to stop CPR.  CPR was stopped but patient had ROSC upon stopping, still on max levophed.  After CPR, patient noted to have copious amounts of bleeding from ETT.   Plan is to continue care, for patient to remain on vent and pressors in hopes [name] [name] and [name] [name] could make it to the hospital.  We will make her comfortable as well with fentanyl and versed.  [name] [name] is going to call his brothers  and we will anticipate their arrival.     Additional CCT 45 mins  [name] Rad, MSN, AGACNP-BC [facility] Pgr: [phone] or if no answer [phone] 10/05/2018, 11:37 PM

## 2018-10-05 NOTE — Progress Notes (Signed)
Spoke extensively to [place] They appreciated having a discussion about their mother  Multiple comorbidities discussed  The plan is to treat aggressively for the next 48 to 72 hours and then prognosticate realistic expectations They note that each time she has been in the hospital she returns on weeker  The family requests that we let them know whether she is doing well or doing any better  They are certain she will not want to be kept alive artificially  Patient's Sons [name] [name] [phone] [name] [name] [phone] [name] [name]

## 2018-10-05 NOTE — Procedures (Signed)
Intubation Procedure Note [name] [name] [id number] [birth year]  Procedure: Intubation Indications: Respiratory insufficiency  Procedure Details Consent: Risks of procedure as well as the alternatives and risks of each were explained to the (patient/caregiver).  Consent for procedure obtained. Time Out: Verified patient identification, verified procedure, site/side was marked, verified correct patient position, special equipment/implants available, medications/allergies/relevent history reviewed, required imaging and test results available.  Performed  Maximum sterile technique was used including gloves, hand hygiene and mask.  MAC and 3 with glide scope.     Evaluation Hemodynamic Status: BP stable throughout; O2 sats: stable throughout Patient's Current Condition: stable Complications: No apparent complications Patient did tolerate procedure well. Chest X-ray ordered to verify placement.  CXR: pending.   [name] [name] 10/05/2018

## 2018-10-05 NOTE — Progress Notes (Signed)
PROGRESS NOTE  [name] [name] IWP:[id number] DOB: 01-09-1933 DOA: 09/15/2018 PCP: [name], MD   LOS: 6 days   Brief Narrative / Interim history: Is an 85 year old female with history of hypertension, pulmonary hypertension, chronic respiratory failure on home oxygen, history of Wegener's (granulomatosis with polyangiitis), permanent A. fib, chronic diastolic CHF, chronic kidney disease stage III with baseline creatinine 1.7-2.5, who was admitted to the hospital on 09/24/2018 due to acute onset shortness of breath and hypoxia.  She was initially admitted to the ICU, placed on high-dose steroids.  Oxygenation improved and was transferred to the floor on the hospitalist service on 6/27  Overnight events Early this morning per nurse patient has been starting to have worsening oxygenation with sats in the 60s initially requiring nonrebreather and eventually she was placed on BiPAP around 5:17 AM per respiratory therapy notes.  Of note, I discussed with the night in-house coverage and the NP was not notified of the change in patient's clinical status.  Bedside nurse confirms that she did not notify the nurse practitioner.  I evaluated patient at bedside around 6:30 AM, she is on BiPAP, she is tachypneic with respiratory rate in the 30s and she has diminished, coarse breath sounds bilaterally.  She appears confused but does say that she is short of breath.  ABG reviewed and shows hypoxia with PO2 of 85 on a nonrebreather.  Stat chest x-ray and stat critical care consultation were obtained, she is to be transferred to the ICU and potentially intubated.  Assessment & Plan: Principal Problem:   Acute on chronic respiratory failure with hypoxemia (HCC) Active Problems:   Hypothyroidism   Anemia   Diastolic CHF, chronic (HCC) - on low dose Lasix. ARB & BB along with Norvasc   Chronic atrial fibrillation (HCC) CHADS2-VASc=6.  On Corrected "low" dose of Eliquis   CKD (chronic kidney disease) stage 3,  GFR 30-59 ml/min (HCC)   Principal Problem Acute on chronic hypoxic respiratory failure, granulomatosis with polyangiitis -Questionable component of infectious process, she was placed on azithromycin, now after transfer to the ICU out of concern for aspiration she was placed on Unasyn -She was diuresed with Lasix, this was discontinued on 6/27 due to elevation in creatinine.  Cannot exclude a component of pulmonary edema on the morning chest x-ray and will give another dose of Lasix -Lower extremity Dopplers negative for DVT, CT angiogram was deferred due to renal failure -Now with worsening respiratory failure will transfer back to the ICU  Active Problems Permanent atrial fibrillation -She is on chronic anticoagulation with Eliquis.  She has been on and off of Eliquis in the recent months due to intermittent epistaxis but currently taking it. -Nurse reports hemoptysis, very small amounts.  After arrival in ICU she was "vomiting blood".  Discontinue Eliquis, I wonder whether this is epistaxis versus worsening hemoptysis.  Acute on chronic diastolic CHF with elevated BNP -Lasix x1 this morning.  Acute kidney injury on chronic kidney disease stage III -Baseline creatinine 1.7-2.5, currently slightly worsening at 3.3 -Avoid nephrotoxins, closely monitor -patient surprised to hear that she has CKD, Her Cr has been significantly higher during this hospitalization in the 3 range, 1.68 earlier this year.  -Nephrology following  Hypertension -ICU management for blood pressure  Hypothyroidism -Continue Synthroid  Anemia of chronic disease, CKD stage IV -Patient received 2 units of packed red blood cells total, but no bleeding to explain was noted, this is likely multifactorial in the setting of acute critical illness as well  as chronic kidney disease  Elevated troponin -Cardiology was consulted and followed patient.  She underwent a 2D echo on 09/28/2018 with normal LVEF of 55-60%.  Believed  to be demand ischemia   Scheduled Meds:  amLODipine  5 mg Oral Daily   Chlorhexidine Gluconate Cloth  6 each Topical Q0600   famotidine  10 mg Oral BID   feeding supplement (PRO-STAT SUGAR FREE 64)  30 mL Per Tube BID   feeding supplement (VITAL HIGH PROTEIN)  1,000 mL Per Tube Q24H   insulin aspart  0-15 Units Subcutaneous TID WC   insulin aspart  0-5 Units Subcutaneous QHS   insulin glargine  5 Units Subcutaneous Q2200   ipratropium-albuterol  3 mL Nebulization TID   levothyroxine  100 mcg Oral QAC breakfast   mouth rinse  15 mL Mouth Rinse BID   nebivolol  10 mg Oral Daily   predniSONE  40 mg Oral Q breakfast   Followed by   Derrill Memo ON 10/10/2018] predniSONE  20 mg Oral Q breakfast   Continuous Infusions:  ampicillin-sulbactam (UNASYN) IV 3 g (10/05/18 1037)   dexmedetomidine (PRECEDEX) IV infusion 0.4 mcg/kg/hr (10/05/18 1034)   PRN Meds:.acetaminophen, ondansetron (ZOFRAN) IV  DVT prophylaxis: On Eliquis Code Status: Full code Family Communication: Discussed with patient Disposition Plan: To be determined  Consultants:   Pulmonary critical care  Procedures:   2D echo:  IMPRESSIONS  1. The left ventricle has normal systolic function, with an ejection fraction of 55-60%. The cavity size was normal. There is mildly increased left ventricular wall thickness. Left ventricular diastolic Doppler parameters are indeterminate. No evidence  of left ventricular regional wall motion abnormalities. 2. The right ventricle has normal systolic function. The cavity was normal. There is no increase in right ventricular wall thickness. 3. Left atrial size was severely dilated. 4. Right atrial size was moderately dilated. 5. No evidence of mitral valve stenosis. Mild mitral regurgitation. 6. Tricuspid valve regurgitation is moderate-severe. 7. The aortic valve is tricuspid. Mild calcification of the aortic valve. Aortic valve regurgitation is mild by color flow  Doppler. No stenosis of the aortic valve. 8. The aortic root is normal in size and structure. 9. There is mild dilatation of the ascending aorta measuring 41 mm. 10. The inferior vena cava was dilated in size with <50% respiratory variability. PA systolic pressure 68 mmHg. 11. The patient was in atrial fibrillation.  Antimicrobials:  Azithromycin   Objective: Vitals:   10/05/18 0834 10/05/18 0900 10/05/18 0935 10/05/18 1000  BP:  (!) 151/99  120/70  Pulse:  91  80  Resp:  (!) 40  (!) 21  Temp:      TempSrc:      SpO2: 95% (!) 85% 100% 98%  Weight:      Height:        Intake/Output Summary (Last 24 hours) at 10/05/2018 1045 Last data filed at 10/04/2018 1842 Gross per 24 hour  Intake 315 ml  Output 300 ml  Net 15 ml   Filed Weights   10/03/18 0300 10/04/18 0300 10/05/18 0730  Weight: 69.2 kg 67.9 kg 70 kg    Examination:  Constitutional: On BiPAP, tachypneic, confused ENMT: mmm Neck: normal, supple Respiratory: Increased work of breathing, tachypneic, fighting the BiPAP.  Coarse breath sounds bilaterally, decent air movement Cardiovascular: Regular rate and rhythm, no new murmurs, no peripheral edema Abdomen: Soft, positive bowel sounds, does not appear to be tender Musculoskeletal: no clubbing / cyanosis.  Skin: No rashes Neurologic: Moves all  4 extremities independently   Data Reviewed: I have independently reviewed following labs and imaging studies   CBC: Recent Labs  Lab 10/04/2018 0145  09/25/2018 0918 10/05/2018 1715 10/02/2018 1836 09/30/18 0353 10/01/18 0345 10/04/18 0959  WBC 10.2  --  11.4* 8.5  --  8.3 7.3 11.9*  NEUTROABS 8.3*  --   --   --   --   --  6.8  --   HGB 6.6*   < > 6.3* 7.6* 9.5* 7.4* 7.6* 6.7*  HCT 21.9*   < > 19.3* 23.7* 28.0* 23.6* 24.2* 21.0*  MCV 103.8*  --  98.0 98.8  --  99.6 99.6 95.9  PLT 399  --  351 335  --  313 308 371   < > = values in this interval not displayed.   Basic Metabolic Panel: Recent Labs  Lab 09/30/2018 0918  09/14/2018 1715 09/24/2018 1836 09/30/18 0353 10/01/18 0345 10/03/18 0730 10/04/18 0959  NA 134*  --  135 134* 131* 129* 129*  K 4.3  --  4.2 4.4 4.4 4.7 4.5  CL 101  --   --  101 96* 93* 95*  CO2 22  --   --  _0 GLUCOSE 152*  --   --  102* 372* 185* 180*  BUN 42*  --   --  39* 44* 64* 73*  CREATININE 3.01*  --   --  2.96* 3.10* 3.39* 3.29*  CALCIUM 8.2*  --   --  8.0* 8.0* 8.3* 8.4*  MG QUANTITY NOT SUFFICIENT, UNABLE TO PERFORM TEST 2.5*  --  2.4 2.4  --   --   PHOS 4.5  --   --  4.4 5.3*  --   --    GFR: Estimated Creatinine Clearance: 11.9 mL/min (A) (by C-G formula based on SCr of 3.29 mg/dL (H)). Liver Function Tests: Recent Labs  Lab 10/03/2018 0145 09/16/2018 0918  AST 17 17  ALT 8 8  ALKPHOS 71 61  BILITOT 1.2 1.3*  PROT 6.9 6.9  ALBUMIN 2.9* 2.9*   No results for input(s): LIPASE, AMYLASE in the last 168 hours. No results for input(s): AMMONIA in the last 168 hours. Coagulation Profile: No results for input(s): INR, PROTIME in the last 168 hours. Cardiac Enzymes: No results for input(s): CKTOTAL, CKMB, CKMBINDEX, TROPONINI in the last 168 hours. BNP (last 3 results) Recent Labs    03/22/18 1056  PROBNP 1,910.0*   HbA1C: No results for input(s): HGBA1C in the last 72 hours. CBG: Recent Labs  Lab 10/04/18 0743 10/04/18 1153 10/04/18 1558 10/04/18 2104 10/05/18 0909  GLUCAP 151* 131* 241* 202* 139*   Lipid Profile: No results for input(s): CHOL, HDL, LDLCALC, TRIG, CHOLHDL, LDLDIRECT in the last 72 hours. Thyroid Function Tests: Recent Labs    10/04/18 0959  TSH 43.781*   Anemia Panel: Recent Labs    10/04/18 0959  FERRITIN 619*  TIBC 293  IRON 32   Urine analysis:    Component Value Date/Time   COLORURINE YELLOW 09/08/2018 0539   APPEARANCEUR HAZY (A) 09/17/2018 0539   LABSPEC 1.013 09/17/2018 0539   PHURINE 5.0 09/26/2018 0539   GLUCOSEU NEGATIVE 09/28/2018 0539   HGBUR MODERATE (A) 09/10/2018 0539   BILIRUBINUR NEGATIVE  09/30/2018 0539   BILIRUBINUR n 12/30/2010 1700   KETONESUR NEGATIVE 09/30/2018 0539   PROTEINUR 100 (A) 09/13/2018 0539   UROBILINOGEN 1.0 09/25/2014 1550   NITRITE NEGATIVE 09/11/2018 [place] 09/25/2018 0539  Sepsis Labs: Invalid input(s): PROCALCITONIN, LACTICIDVEN  Recent Results (from the past 240 hour(s))  SARS Coronavirus 2 (CEPHEID- Performed in [place] hospital lab), Hosp Order     Status: None   Collection Time: 09/17/2018  2:33 AM   Specimen: Nasopharyngeal Swab  Result Value Ref Range Status   SARS Coronavirus 2 NEGATIVE NEGATIVE Final    Comment: (NOTE) If result is NEGATIVE SARS-CoV-2 target nucleic acids are NOT DETECTED. The SARS-CoV-2 RNA is generally detectable in upper and lower  respiratory specimens during the acute phase of infection. The lowest  concentration of SARS-CoV-2 viral copies this assay can detect is 250  copies / mL. A negative result does not preclude SARS-CoV-2 infection  and should not be used as the sole basis for treatment or other  patient management decisions.  A negative result may occur with  improper specimen collection / handling, submission of specimen other  than nasopharyngeal swab, presence of viral mutation(s) within the  areas targeted by this assay, and inadequate number of viral copies  (<250 copies / mL). A negative result must be combined with clinical  observations, patient history, and epidemiological information. If result is POSITIVE SARS-CoV-2 target nucleic acids are DETECTED. The SARS-CoV-2 RNA is generally detectable in upper and lower  respiratory specimens dur ing the acute phase of infection.  Positive  results are indicative of active infection with SARS-CoV-2.  Clinical  correlation with patient history and other diagnostic information is  necessary to determine patient infection status.  Positive results do  not rule out bacterial infection or co-infection with other viruses. If result  is PRESUMPTIVE POSTIVE SARS-CoV-2 nucleic acids MAY BE PRESENT.   A presumptive positive result was obtained on the submitted specimen  and confirmed on repeat testing.  While 2019 novel coronavirus  (SARS-CoV-2) nucleic acids may be present in the submitted sample  additional confirmatory testing may be necessary for epidemiological  and / or clinical management purposes  to differentiate between  SARS-CoV-2 and other Sarbecovirus currently known to infect humans.  If clinically indicated additional testing with an alternate test  methodology [phone]) is advised. The SARS-CoV-2 RNA is generally  detectable in upper and lower respiratory sp ecimens during the acute  phase of infection. The expected result is Negative. Fact Sheet for Patients:  StrictlyIdeas.no Fact Sheet for Healthcare Providers: BankingDealers.co.za This test is not yet approved or cleared by the [place] FDA and has been authorized for detection and/or diagnosis of SARS-CoV-2 by FDA under an Emergency Use Authorization (EUA).  This EUA will remain in effect (meaning this test can be used) for the duration of the COVID-19 declaration under Section 564(b)(1) of the Act, 21 U.S.C. section 360bbb-3(b)(1), unless the authorization is terminated or revoked sooner. Performed at [facility], [place] [address] [place], [place] [address]   MRSA PCR Screening     Status: None   Collection Time: 09/16/2018  8:17 AM   Specimen: Nasal Mucosa; Nasopharyngeal  Result Value Ref Range Status   MRSA by PCR NEGATIVE NEGATIVE Final    Comment:        The GeneXpert MRSA Assay (FDA approved for NASAL specimens only), is one component of a comprehensive MRSA colonization surveillance program. It is not intended to diagnose MRSA infection nor to guide or monitor treatment for MRSA infections. Performed at [facility], [place] [address] [place], [place] [address]        Radiology Studies: US Renal  Result Date: 10/03/2018 CLINICAL  DATA:  Stage IV chronic kidney disease, history breast cancer, atrial fibrillation, type II diabetes mellitus, pulmonary hypertension, hypertension EXAM: RENAL / URINARY TRACT ULTRASOUND COMPLETE COMPARISON:  CT abdomen and pelvis 06/30/2016 FINDINGS: Right Kidney: Renal measurements: 8.8 x 5.0 x 4.5 cm = volume: 104 mL. Cortical thinning. Increased cortical echogenicity. No mass, hydronephrosis or shadowing calcification. Left Kidney: Renal measurements: 8.8 x 4.6 x 4.7 cm = volume: 99 mL. Cortical thinning. Increased cortical echogenicity. No mass, hydronephrosis or shadowing calcification. Bladder: Appears normal for degree of bladder distention. BILATERAL ureteral jets identified. IMPRESSION: Atrophy and medical renal disease changes of both kidneys. No renal mass or hydronephrosis. Electronically Signed   By: Lavonia Dana M.D.   On: 10/03/2018 22:09   Dg Chest Port 1 View  Result Date: 10/05/2018 CLINICAL DATA:  Dyspnea EXAM: PORTABLE CHEST 1 VIEW COMPARISON:  10/01/2018 FINDINGS: Persistent cardiomegaly is noted. Aortic calcifications are seen. Increasing bilateral infiltrates are noted when compare with the prior study. No sizable effusion is noted. No pneumothorax is seen. IMPRESSION: Increasing bilateral infiltrates. Electronically Signed   By: Inez Catalina M.D.   On: 10/05/2018 07:31   Critical care time: 45 minutes, including bedside assessment, monitoring of vital signs until critical care arrival, discussed at bedside with critical care MDs, chest x-ray reviewed with complete whiteout, evaluated after transfer to the ICU as well  Marzetta Board, MD, PhD [facility]  Contact via  [email or web address]  [place] P: [phone] F: [phone]

## 2018-10-05 NOTE — Progress Notes (Signed)
Tube feed not yet started d/t no Kangaroo pump available at this time. Administration actively working on a solution.

## 2018-10-05 NOTE — Progress Notes (Signed)
eLink Physician-Brief Progress Note Patient Name: [name] [name] DOB: 30-Jan-1933 MRN: [id number]   Date of Service  10/05/2018  HPI/Events of Note  Pt now with SQ emphysema raising concern for pneumothorax, sat portable CXR ordered and Ground crew requested to evaluate patient.  eICU Interventions  Stat portable CXR, Ground crew to see.        Kerry Kass Ogan 10/05/2018, 10:42 PM

## 2018-10-05 NOTE — Progress Notes (Signed)
Spoke with the patients son Ariell Gunnels on the phone. He asked that her youngest son Maleeka Sabatino be the point of contact for the providers. He also asked that if her condition worsened or if it didn't seem that she would have any quality of life then to please let them know. He stated, "please be honest with [place], we know she is very sick". I updated him on the intubation, insertion of a central line, F/C, and OG tube.

## 2018-10-05 NOTE — Procedures (Signed)
.  Chest Tube Insertion Procedure Note  Indications:  Clinically significant Pneumothorax  Pre-operative Diagnosis: Pneumothorax  Post-operative Diagnosis: Pneumothorax  Procedure Details  Emergent, no consent.   After sterile skin prep, using standard technique, a 14 French tube was placed in the right anterior 2nd rib space.  Pigtail hemothorax tube.  Patient's family declined large bore chest tube.   Initially attempted needle decompression with 18g needle, only serosanguinous fluid easily flowed back into syringe upon insertion of the needle about 2cm.   CXR confirmed PTX on CXR while setting up for pigtail.  Placed tube, air leak present in pneumovac canister.  No improvement in sat above 75%, patient had had hemoptysis of bright red blood prior to start of procedure.   Developed PEA cardiac arrest, large amount bright red blood from ET tube following 1 round of CPR and epinephrine 3m.     Findings: None  Estimated Blood Loss:  Minimal         Specimens:  None              Complications:  As  Noted above.  No direct complications of procedure.  No improvement in Sat following placement of tube, despite air leak.           Disposition: ICU - intubated and critically ill.         Condition: unstable

## 2018-10-06 ENCOUNTER — Inpatient Hospital Stay (HOSPITAL_COMMUNITY): Payer: Medicare Other

## 2018-10-06 MED FILL — Medication: Qty: 1 | Status: AC

## 2018-10-06 DEATH — deceased

## 2018-10-15 ENCOUNTER — Telehealth: Payer: Self-pay

## 2018-10-15 NOTE — Telephone Encounter (Signed)
Received dc from [facility] (original).  DC is for Burial and a patient of Doctor Olalere.   DC will be taken to Pulmonary Unit for signature.  On 10/18/2018 Received signed dc back from Doctor Ander Slade.  I called the funeral home to let them know the dc was mailed to vital records per the funeral home request.

## 2018-10-26 ENCOUNTER — Telehealth: Payer: Medicare Other | Admitting: Cardiology

## 2018-11-06 NOTE — Progress Notes (Signed)
Patient was pronounced dead after RN Purcell Nails and RN Diamantina Providence auscultated no heart and breath sounds for over one minute. MD and Son present at bedside.

## 2018-11-06 NOTE — Progress Notes (Signed)
Belongings returned to sonChaunice Obie)

## 2018-11-06 NOTE — Discharge Summary (Signed)
DEATH SUMMARY   Patient Details  Name: [name] [name] MRN: [id number] DOB: [birth year]  Admission/Discharge Information   Admit Date:  10/16/2018  Date of Death: Date of Death: October 13, 2018  Time of Death: Time of Death: May 20, 2022  Length of Stay: 7  Referring Physician: [name], MD   Reason(s) for Hospitalization  Patient was admitted to the hospital with worsening shortness of breath  Diagnoses  Preliminary cause of death:   Cardiorespiratory arrest Large pneumothorax Interstitial lung disease Pulmonary hypertension  Secondary Diagnoses (including complications and co-morbidities):  Principal Problem:   Acute on chronic respiratory failure with hypoxemia [facility]) Active Problems:   Hypothyroidism   Anemia   Diastolic CHF, chronic (HCC) - on low dose Lasix. ARB & BB along with Norvasc   Chronic atrial fibrillation (HCC) CHADS2-VASc=6.  On Corrected "low" dose of Eliquis   CKD (chronic kidney disease) stage 3, GFR 30-59 ml/min [facility])   Brief Hospital Course (including significant findings, care, treatment, and services provided and events leading to death)  [name] [name] is a 85 y.o. year old female who admitted to the hospital with worsening shortness of breath Exacerbation of interstitial lung disease, decompensated congestive heart failure Was treated with diuretics, high-dose steroids.  She did stabilize for a few days but then started decompensating again and required BiPAP, she was transferred to the unit.  Had to be placed on a ventilator secondary to intolerance of BiPAP and hemoptysis. Sustained acute kidney injury on chronic kidney disease. Hypotension necessitating initiation of pressors She continued to decompensate while on the vent Stat chest x-ray did reveal presence of pneumothorax-chest tube was placed Patient deteriorated further Went into PEA cardiorespiratory arrest-CPR was performed Was made comfort measures  Pronounced dead at [facility] on  10/13/18   Pertinent Labs and Studies  Significant Diagnostic Studies Dg Abd 1 View  Result Date: 10/05/2018 CLINICAL DATA:  Orogastric tube placement. EXAM: ABDOMEN - 1 VIEW COMPARISON:  Radiograph of March 15, 2018. FINDINGS: The bowel gas pattern is normal. Distal tip of enteric tube is seen in proximal stomach. Status post cholecystectomy. IMPRESSION: Distal tip of enteric tube is seen in proximal stomach. No definite evidence of bowel obstruction or ileus. Electronically Signed   By: Marijo Conception M.D.   On: 10/05/2018 13:21   Ct Chest Wo Contrast  Result Date: 09/30/2018 CLINICAL DATA:  Hypoxic respiratory failure. Denies chest pain or shortness of breath. EXAM: CT CHEST WITHOUT CONTRAST TECHNIQUE: Multidetector CT imaging of the chest was performed following the standard protocol without IV contrast. COMPARISON:  Chest x-ray from same day. CT chest dated March 26, 2018. FINDINGS: Cardiovascular: Unchanged cardiomegaly and trace pericardial effusion. Coronary, aortic arch, and branch vessel atherosclerotic vascular disease. Mediastinum/Nodes: New borderline and mildly enlarged mediastinal lymph nodes measuring up to 13 mm in short axis, likely reactive. No enlarged axillary lymph nodes. The thyroid gland, trachea, and esophagus demonstrate no significant findings. Unchanged small hiatal hernia. Lungs/Pleura: Interval worsening in diffuse bilateral ground-glass and consolidative opacities with unchanged areas of bronchiectasis/bronchiolectasis, septal thickening, and architectural distortion. New trace right pleural effusion. No pneumothorax. Upper Abdomen: No acute abnormality. Unchanged 1.7 cm low-density lesion in the pancreatic tail, likely benign. Musculoskeletal: No chest wall mass or suspicious bone lesions identified. IMPRESSION: 1. Interval worsening in diffuse bilateral ground-glass and consolidative opacities with unchanged areas of bronchiectasis/bronchiolectasis, septal  thickening, and architectural distortion. Differential includes worsening underlying granulomatosis with polyangiitis or superimposed multilobar pneumonia. 2. Unchanged cardiomegaly and trace pericardial effusion. New trace  right pleural effusion. 3.  Aortic atherosclerosis (ICD10-I70.0). Electronically Signed   By: Titus Dubin M.D.   On: 09/30/2018 15:41   US Renal  Result Date: 10/03/2018 CLINICAL DATA:  Stage IV chronic kidney disease, history breast cancer, atrial fibrillation, type II diabetes mellitus, pulmonary hypertension, hypertension EXAM: RENAL / URINARY TRACT ULTRASOUND COMPLETE COMPARISON:  CT abdomen and pelvis 06/30/2016 FINDINGS: Right Kidney: Renal measurements: 8.8 x 5.0 x 4.5 cm = volume: 104 mL. Cortical thinning. Increased cortical echogenicity. No mass, hydronephrosis or shadowing calcification. Left Kidney: Renal measurements: 8.8 x 4.6 x 4.7 cm = volume: 99 mL. Cortical thinning. Increased cortical echogenicity. No mass, hydronephrosis or shadowing calcification. Bladder: Appears normal for degree of bladder distention. BILATERAL ureteral jets identified. IMPRESSION: Atrophy and medical renal disease changes of both kidneys. No renal mass or hydronephrosis. Electronically Signed   By: Lavonia Dana M.D.   On: 10/03/2018 22:09   Dg Chest Port 1 View  Result Date: 2018/11/04 CLINICAL DATA:  Pneumothorax EXAM: PORTABLE CHEST 1 VIEW COMPARISON:  10/05/2018 FINDINGS: Interval placement of right pigtail pleural catheter. Continued moderate-sized right pneumothorax and subcutaneous emphysema, new continued stable moderate right pneumothorax. Lucency noted at the left base concerning for loculated left basilar pneumothorax. Diffuse subcutaneous emphysema. Cardiomegaly. Diffuse bilateral airspace disease. IMPRESSION: Interval placement of right pleural chest tube with stable moderate-sized right pneumothorax. Findings concerning for loculated left basilar pneumothorax. Diffuse subcutaneous  emphysema and diffuse airspace disease, unchanged. Electronically Signed   By: Rolm Baptise M.D.   On: 2018/11/04 00:12   Dg Chest Port 1 View  Result Date: 04-Nov-2018 CLINICAL DATA:  Respiratory failure EXAM: PORTABLE CHEST 1 VIEW COMPARISON:  10/05/2018 FINDINGS: New moderate sized right pneumothorax. Extensive subcutaneous emphysema. Lucency at the left lung base could reflect loculated left basilar pneumothorax. Diffuse bilateral airspace disease is similar prior study. Cardiomegaly. Endotracheal tube and NG tube are unchanged. IMPRESSION: New moderate sized right pneumothorax and diffuse subcutaneous emphysema. Lucency at the left base could reflect left basilar loculated pneumothorax. Stable severe diffuse bilateral airspace disease. Cardiomegaly. Critical Value/emergent results were called by telephone at the time of interpretation on 11-04-18 at 12:08 am to patient's nurse, Herbie Baltimore, who verbally acknowledged these results. Electronically Signed   By: Rolm Baptise M.D.   On: 11-04-2018 00:10   Dg Chest Port 1 View  Result Date: 10/05/2018 CLINICAL DATA:  Intubation EXAM: PORTABLE CHEST 1 VIEW COMPARISON:  10/05/2018 FINDINGS: Endotracheal tube in good position. Severe diffuse bilateral airspace disease mildly improved from earlier today. No significant pleural effusion NG tube placed.  The side hole in the distal esophagus. IMPRESSION: Endotracheal tube in good position. Severe diffuse bilateral airspace disease improved from this morning NG side hole in the distal esophagus. Electronically Signed   By: Franchot Gallo M.D.   On: 10/05/2018 11:24   Dg Chest Port 1 View  Result Date: 10/05/2018 CLINICAL DATA:  Dyspnea EXAM: PORTABLE CHEST 1 VIEW COMPARISON:  10/01/2018 FINDINGS: Persistent cardiomegaly is noted. Aortic calcifications are seen. Increasing bilateral infiltrates are noted when compare with the prior study. No sizable effusion is noted. No pneumothorax is seen. IMPRESSION: Increasing  bilateral infiltrates. Electronically Signed   By: Inez Catalina M.D.   On: 10/05/2018 07:31   Dg Chest Port 1 View  Result Date: 10/01/2018 CLINICAL DATA:  Respiratory failure.  Pneumonia. EXAM: PORTABLE CHEST 1 VIEW COMPARISON:  Radiographs 09/20/2018 and 09/30/2018.  CT 09/30/2018. FINDINGS: 0513 hours. Stable cardiomegaly and aortic atherosclerosis. Mild tracheal deviation to the right is  unchanged. Patchy bilateral airspace and ground-glass opacities have not significantly changed. There is no pneumothorax or significant pleural effusion. IMPRESSION: No significant change in bilateral airspace and ground-glass opacities. Electronically Signed   By: Richardean Sale M.D.   On: 10/01/2018 08:11   Dg Chest Port 1 View  Result Date: 09/30/2018 CLINICAL DATA:  Respiratory failure. EXAM: PORTABLE CHEST 1 VIEW COMPARISON:  Radiograph of September 29, 2018. FINDINGS: Stable cardiomegaly. No pneumothorax is noted. Stable bilateral lung opacities are noted concerning for pneumonia or edema. No pneumothorax or significant pleural effusion is noted. Bony thorax is unremarkable. IMPRESSION: Stable bilateral lung opacities are noted concerning for pneumonia or edema. Electronically Signed   By: Marijo Conception M.D.   On: 09/30/2018 07:48   Dg Chest Portable 1 View  Result Date: 09/07/2018 CLINICAL DATA:  Hypoxia.  Syncope. EXAM: PORTABLE CHEST 1 VIEW COMPARISON:  04/09/2018 FINDINGS: Heart size is enlarged. There are multifocal airspace opacities bilaterally. There are prominent interstitial lung markings. There may be small bilateral pleural effusions. There is no acute osseous abnormality. No pneumothorax. IMPRESSION: 1. Diffuse bilateral airspace opacities may be secondary to an atypical infectious process such as viral pneumonia or developing pulmonary edema. 2. Persistent cardiomegaly. Electronically Signed   By: Constance Holster M.D.   On: 10/01/2018 02:05   Vas [place] Lower Extremity Venous (dvt)  Result Date:  10/03/2018  Lower Venous Study Indications: Swelling, and SOB.  Risk Factors: Cancer H/O colon ccancer Hypertension, Pulmaonry HTH, Chronic respiratory failure, H/O [place], A fib on eliquis. Limitations: Respiratory interference due to patient being on BiPAP. Comparison Study: Only comparison available as completed on the right leg                   01/01/07 which was negative. Performing Technologist: Toma Copier RVS  Examination Guidelines: A complete evaluation includes B-mode imaging, spectral Doppler, color Doppler, and power Doppler as needed of all accessible portions of each vessel. Bilateral testing is considered an integral part of a complete examination. Limited examinations for reoccurring indications may be performed as noted.  +---------+---------------+---------+-----------+----------+------------------+ RIGHT    CompressibilityPhasicitySpontaneityPropertiesSummary            +---------+---------------+---------+-----------+----------+------------------+ CFV      Full           Yes      Yes                                     +---------+---------------+---------+-----------+----------+------------------+ SFJ      Full                                                            +---------+---------------+---------+-----------+----------+------------------+ FV Prox  Full           Yes      Yes                                     +---------+---------------+---------+-----------+----------+------------------+ FV Mid   Full                                                            +---------+---------------+---------+-----------+----------+------------------+  FV DistalFull           Yes      Yes                                     +---------+---------------+---------+-----------+----------+------------------+ PFV      Full           Yes      Yes                                      +---------+---------------+---------+-----------+----------+------------------+ POP      Full           Yes      Yes                                     +---------+---------------+---------+-----------+----------+------------------+ PTV      Full                                                            +---------+---------------+---------+-----------+----------+------------------+ PERO     Full                                         Difficult to image +---------+---------------+---------+-----------+----------+------------------+   +---------+---------------+---------+-----------+----------+-------------------+ LEFT     CompressibilityPhasicitySpontaneityPropertiesSummary             +---------+---------------+---------+-----------+----------+-------------------+ CFV      Partial                                                          +---------+---------------+---------+-----------+----------+-------------------+ SFJ      Full                                                             +---------+---------------+---------+-----------+----------+-------------------+ FV Prox  Full                                                             +---------+---------------+---------+-----------+----------+-------------------+ FV Mid   Full                                                             +---------+---------------+---------+-----------+----------+-------------------+ FV DistalFull                                                             +---------+---------------+---------+-----------+----------+-------------------+  PFV      Full                                                             +---------+---------------+---------+-----------+----------+-------------------+ POP      Full                                                             +---------+---------------+---------+-----------+----------+-------------------+ PTV       Full                                                             +---------+---------------+---------+-----------+----------+-------------------+ PERO                                                  Unable to visualize                                                       well enough to                                                            evaluate            +---------+---------------+---------+-----------+----------+-------------------+   Left Technical Findings: Not visualized segments include peroneal. Unable to image well enough to evaluate.   Summary: Right: There is no evidence of deep vein thrombosis in the lower extremity. No cystic structure found in the popliteal fossa. Left: There is no evidence of deep vein thrombosis in the lower extremity. However, portions of this examination were limited- see technologist comments above. No cystic structure found in the popliteal fossa.  *See table(s) above for measurements and observations. Electronically signed by Harold Barban MD on 09/28/2018 at 2:18:59 PM.    Final     Microbiology Recent Results (from the past 240 hour(s))  SARS Coronavirus 2 (CEPHEID- Performed in [place] hospital lab), Hosp Order     Status: None   Collection Time: 09/10/2018  2:33 AM   Specimen: Nasopharyngeal Swab  Result Value Ref Range Status   SARS Coronavirus 2 NEGATIVE NEGATIVE Final    Comment: (NOTE) If result is NEGATIVE SARS-CoV-2 target nucleic acids are NOT DETECTED. The SARS-CoV-2 RNA is generally detectable in upper and lower  respiratory specimens during the acute phase of infection. The lowest  concentration of SARS-CoV-2 viral copies this assay can detect is 250  copies / mL. A negative result does not preclude  SARS-CoV-2 infection  and should not be used as the sole basis for treatment or other  patient management decisions.  A negative result may occur with  improper specimen collection / handling, submission of  specimen other  than nasopharyngeal swab, presence of viral mutation(s) within the  areas targeted by this assay, and inadequate number of viral copies  (<250 copies / mL). A negative result must be combined with clinical  observations, patient history, and epidemiological information. If result is POSITIVE SARS-CoV-2 target nucleic acids are DETECTED. The SARS-CoV-2 RNA is generally detectable in upper and lower  respiratory specimens dur ing the acute phase of infection.  Positive  results are indicative of active infection with SARS-CoV-2.  Clinical  correlation with patient history and other diagnostic information is  necessary to determine patient infection status.  Positive results do  not rule out bacterial infection or co-infection with other viruses. If result is PRESUMPTIVE POSTIVE SARS-CoV-2 nucleic acids MAY BE PRESENT.   A presumptive positive result was obtained on the submitted specimen  and confirmed on repeat testing.  While 2019 novel coronavirus  (SARS-CoV-2) nucleic acids may be present in the submitted sample  additional confirmatory testing may be necessary for epidemiological  and / or clinical management purposes  to differentiate between  SARS-CoV-2 and other Sarbecovirus currently known to infect humans.  If clinically indicated additional testing with an alternate test  methodology [phone]) is advised. The SARS-CoV-2 RNA is generally  detectable in upper and lower respiratory sp ecimens during the acute  phase of infection. The expected result is Negative. Fact Sheet for Patients:  StrictlyIdeas.no Fact Sheet for Healthcare Providers: BankingDealers.co.za This test is not yet approved or cleared by the [place] FDA and has been authorized for detection and/or diagnosis of SARS-CoV-2 by FDA under an Emergency Use Authorization (EUA).  This EUA will remain in effect (meaning this test can be used) for the  duration of the COVID-19 declaration under Section 564(b)(1) of the Act, 21 U.S.C. section 360bbb-3(b)(1), unless the authorization is terminated or revoked sooner. Performed at [facility], [place] [address], [place], [place] [address]   MRSA PCR Screening     Status: None   Collection Time: 09/27/2018  8:17 AM   Specimen: Nasal Mucosa; Nasopharyngeal  Result Value Ref Range Status   MRSA by PCR NEGATIVE NEGATIVE Final    Comment:        The GeneXpert MRSA Assay (FDA approved for NASAL specimens only), is one component of a comprehensive MRSA colonization surveillance program. It is not intended to diagnose MRSA infection nor to guide or monitor treatment for MRSA infections. Performed at [facility], [place] [address], [place], [place] [address]   MRSA PCR Screening     Status: None   Collection Time: 10/05/18 10:42 AM   Specimen: Nasal Mucosa; Nasopharyngeal  Result Value Ref Range Status   MRSA by PCR NEGATIVE NEGATIVE Final    Comment:        The GeneXpert MRSA Assay (FDA approved for NASAL specimens only), is one component of a comprehensive MRSA colonization surveillance program. It is not intended to diagnose MRSA infection nor to guide or monitor treatment for MRSA infections. Performed at [facility], [place] [address] [place],  [address]     Lab Basic Metabolic Panel: Recent Labs  Lab 09/11/2018 1715  09/30/18 0353 10/01/18 0345 10/03/18 0730 10/04/18 0959 10/05/18 1041 10/05/18 1124  NA  --    < >  134* 131* 129* 129* 133* 133*  K  --    < > 4.4 4.4 4.7 4.5 4.5 4.3  CL  --   --  101 96* 93* 95* 96*  --   CO2  --   --  _0 --   GLUCOSE  --   --  102* 372* 185* 180* 152*  --   BUN  --   --  39* 44* 64* 73* 79*  --   CREATININE  --   --  2.96* 3.10* 3.39* 3.29* 3.14*  --   CALCIUM  --    < > 8.0* 8.0* 8.3* 8.4*  8.3* 8.5*  --   MG 2.5*  --  2.4 2.4  --   --  2.4  --   PHOS  --   --  4.4 5.3*  --   --  5.0*  --    < >  = values in this interval not displayed.   Liver Function Tests: Recent Labs  Lab 10/05/18 1041  ALBUMIN 2.9*   No results for input(s): LIPASE, AMYLASE in the last 168 hours. No results for input(s): AMMONIA in the last 168 hours. CBC: Recent Labs  Lab 09/11/2018 1715  09/30/18 0353 10/01/18 0345 10/04/18 0959 10/05/18 0940 10/05/18 1124  WBC 8.5  --  8.3 7.3 11.9* 16.2*  --   NEUTROABS  --   --   --  6.8  --  15.1*  --   HGB 7.6*   < > 7.4* 7.6* 6.7* 7.1* 6.8*  HCT 23.7*   < > 23.6* 24.2* 21.0* 21.8* 20.0*  MCV 98.8  --  99.6 99.6 95.9 93.6  --   PLT 335  --  313 308 371 310  --    < > = values in this interval not displayed.   Cardiac Enzymes: No results for input(s): CKTOTAL, CKMB, CKMBINDEX, TROPONINI in the last 168 hours. Sepsis Labs: Recent Labs  Lab 09/09/2018 1656  09/25/2018 1715 09/30/18 0353 10/01/18 0345 10/04/18 0959 10/05/18 0940  PROCALCITON  --   --  1.40  --   --   --   --   WBC  --    < > 8.5 8.3 7.3 11.9* 16.2*  LATICACIDVEN 1.2  --   --   --   --   --   --    < > = values in this interval not displayed.    Procedures/Operations  Intubation 10/05/2018 Central venous access placement 10/05/2018  Adewale A Olalere 10-26-18, 2:12 PM

## 2018-11-06 NOTE — Progress Notes (Signed)
Patient was pronounced dead 65 at by [facility], RN and Margarette Asal, RN. Patient's son, Montine Hight was at the bedside at time of death. All of the patient's belongs at the bedside was given to [employer].

## 2018-11-06 NOTE — Significant Event (Signed)
Cardiac arrest:  Patient developed hemoptysis (persistent throughout day per nurse).  Sudden worsening hypoxia to 52s.  High peak pressures, subq emphysema.   Family declined large bore chest tube, pigtail tube placed (did not have appropriate angiocath for quick needle decompression).    After tube placed, patient remained hypoxic, though leak was positive.   Blood Pressure continued dropping, pulse thready, 557mg epi given.  Levohped increased.   A few min later, pulse not palpable, received one round of cpr, remainder of epi (addiitional 507m) given.   ROSC achieved.  More copious hemoptysis developed after stopped cpr.   Family decided on DNR, comfort care concurrently.   Patient remianed on levophed but resumed precedex and started fentanyl.   Discussed with son RaLouie Casa

## 2018-11-06 DEATH — deceased

## 2020-04-20 IMAGING — DX DG CHEST 1V PORT
1 series · 1 of 1 positions shown · non-contrast
Comparison: 03/15/2018, 09/26/2017, CT 03/15/2018

CLINICAL DATA: ETT position

EXAM:
PORTABLE CHEST 1 VIEW

[chest ap]
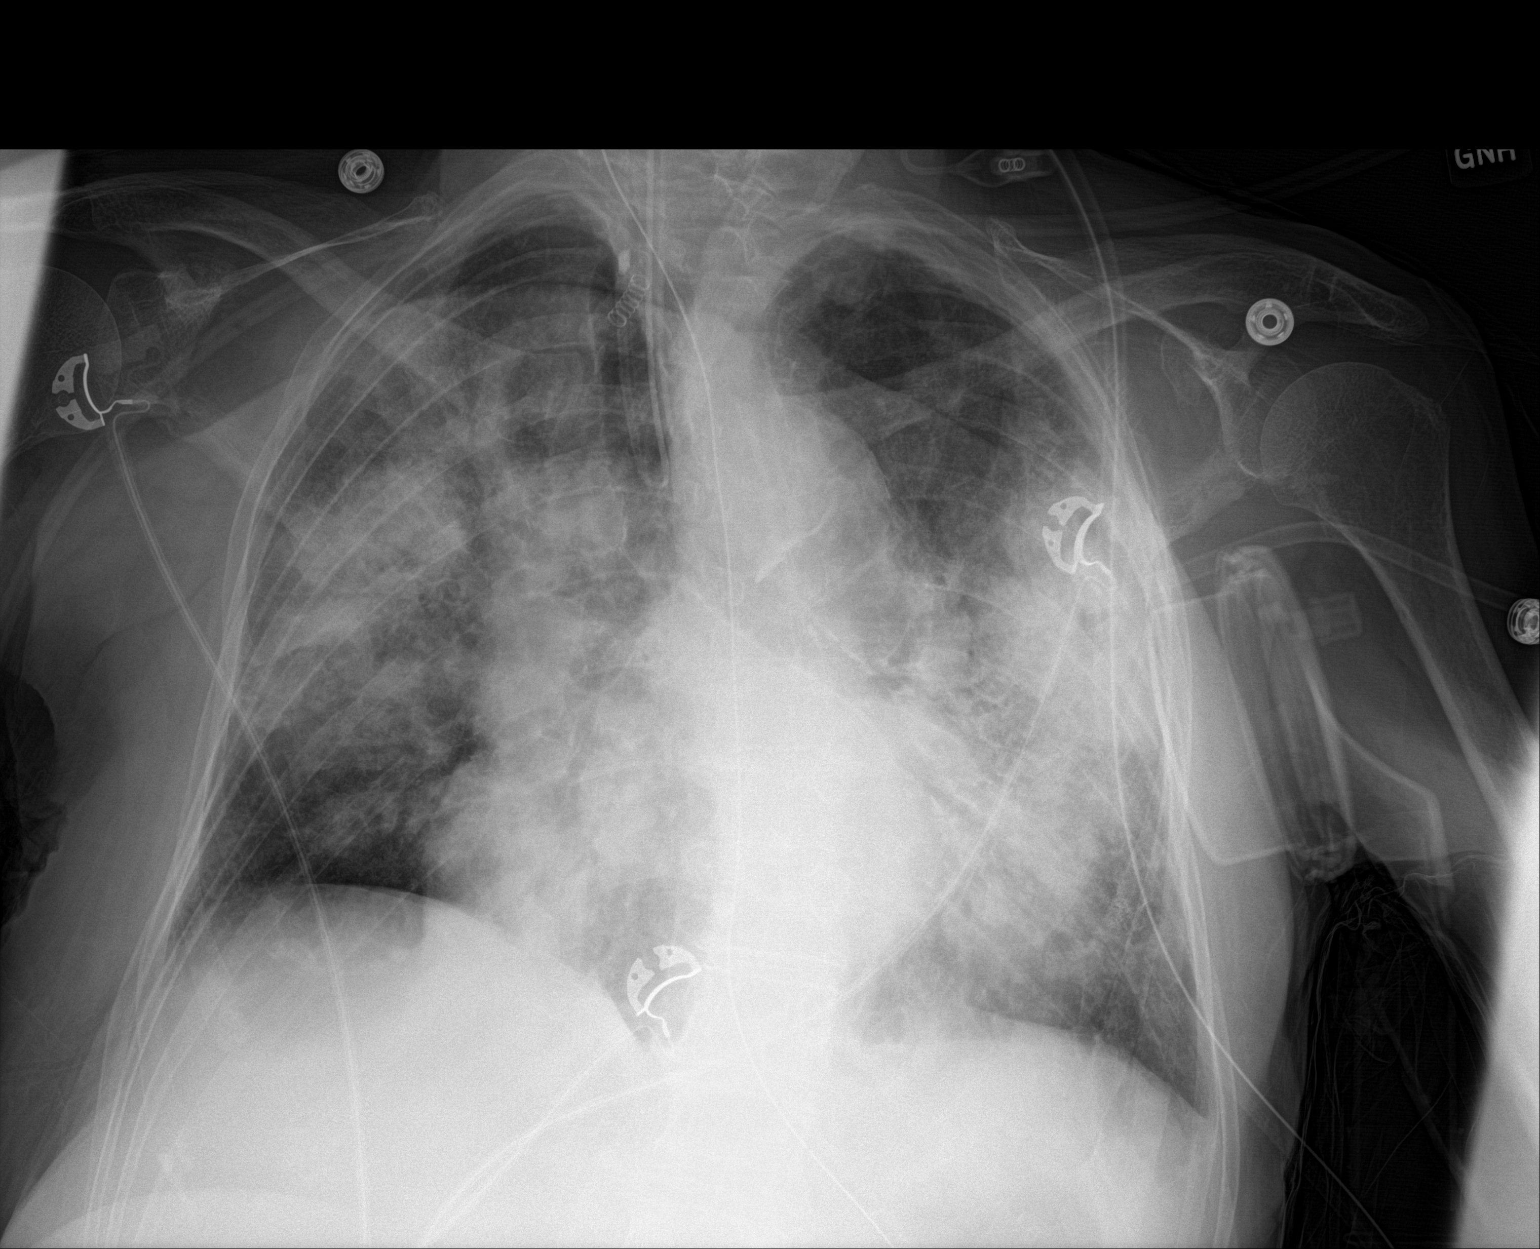

[1 of 1 positions shown; findings below may reference images not displayed]

FINDINGS: Endotracheal tube tip is about 17 mm superior to the carina.
Esophageal tube tip below the diaphragm but non included.
Cardiomegaly with aortic atherosclerosis. Multifocal airspace
consolidations. No pneumothorax.
IMPRESSION: 1. Endotracheal tube tip about 17 mm superior to the carina
2. Multifocal bilateral airspace consolidations suspect for
multifocal pneumonia
3. Cardiomegaly

## 2020-04-20 IMAGING — CR DG CHEST 2V
2 series · 2 of 2 positions shown · non-contrast
Comparison: Single-view of the chest 08/26/2017. CT chest
09/17/2017.

CLINICAL DATA: Decreased oxygen saturation. History of breast
cancer.

EXAM:
CHEST - 2 VIEW

[w chest lat]
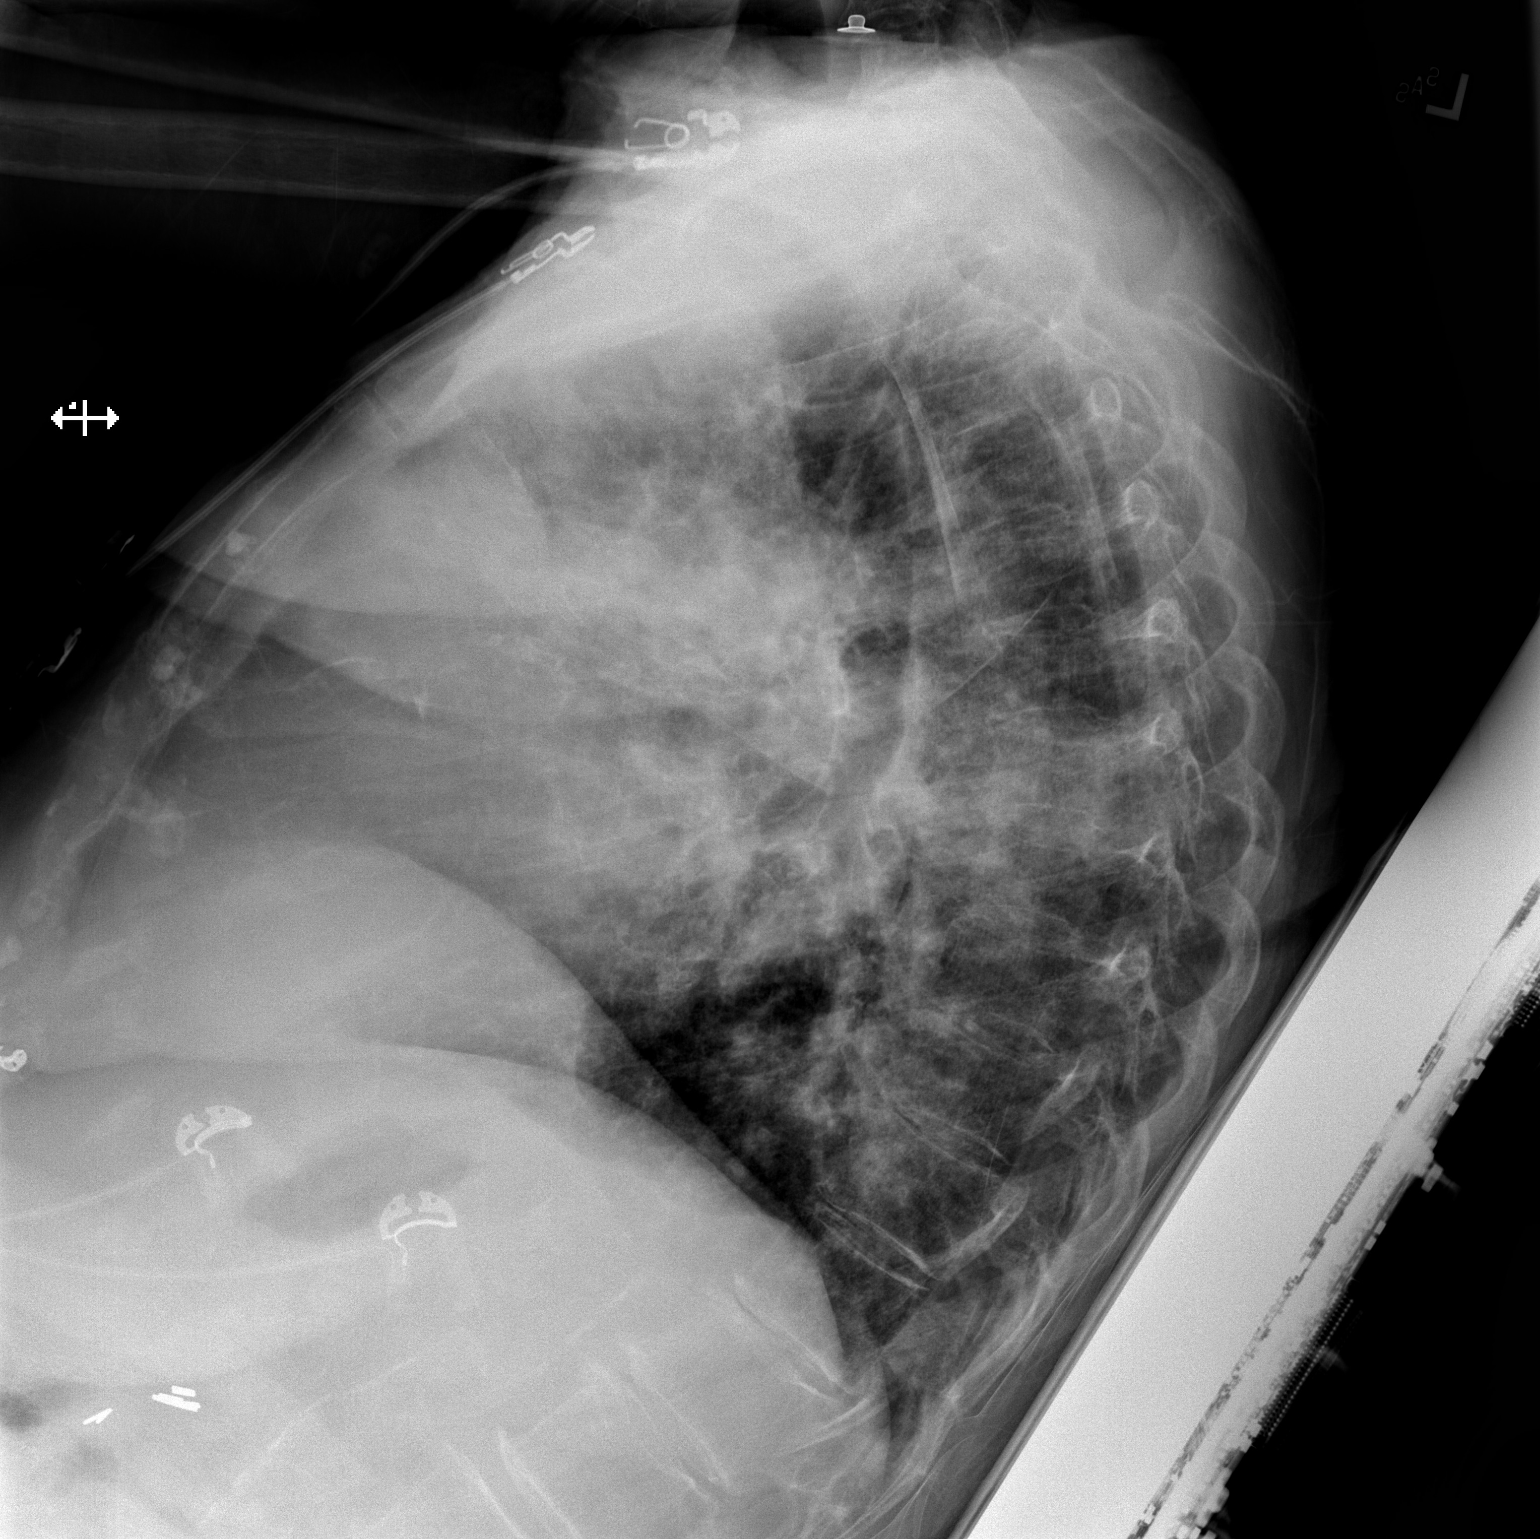

[x chest ap]
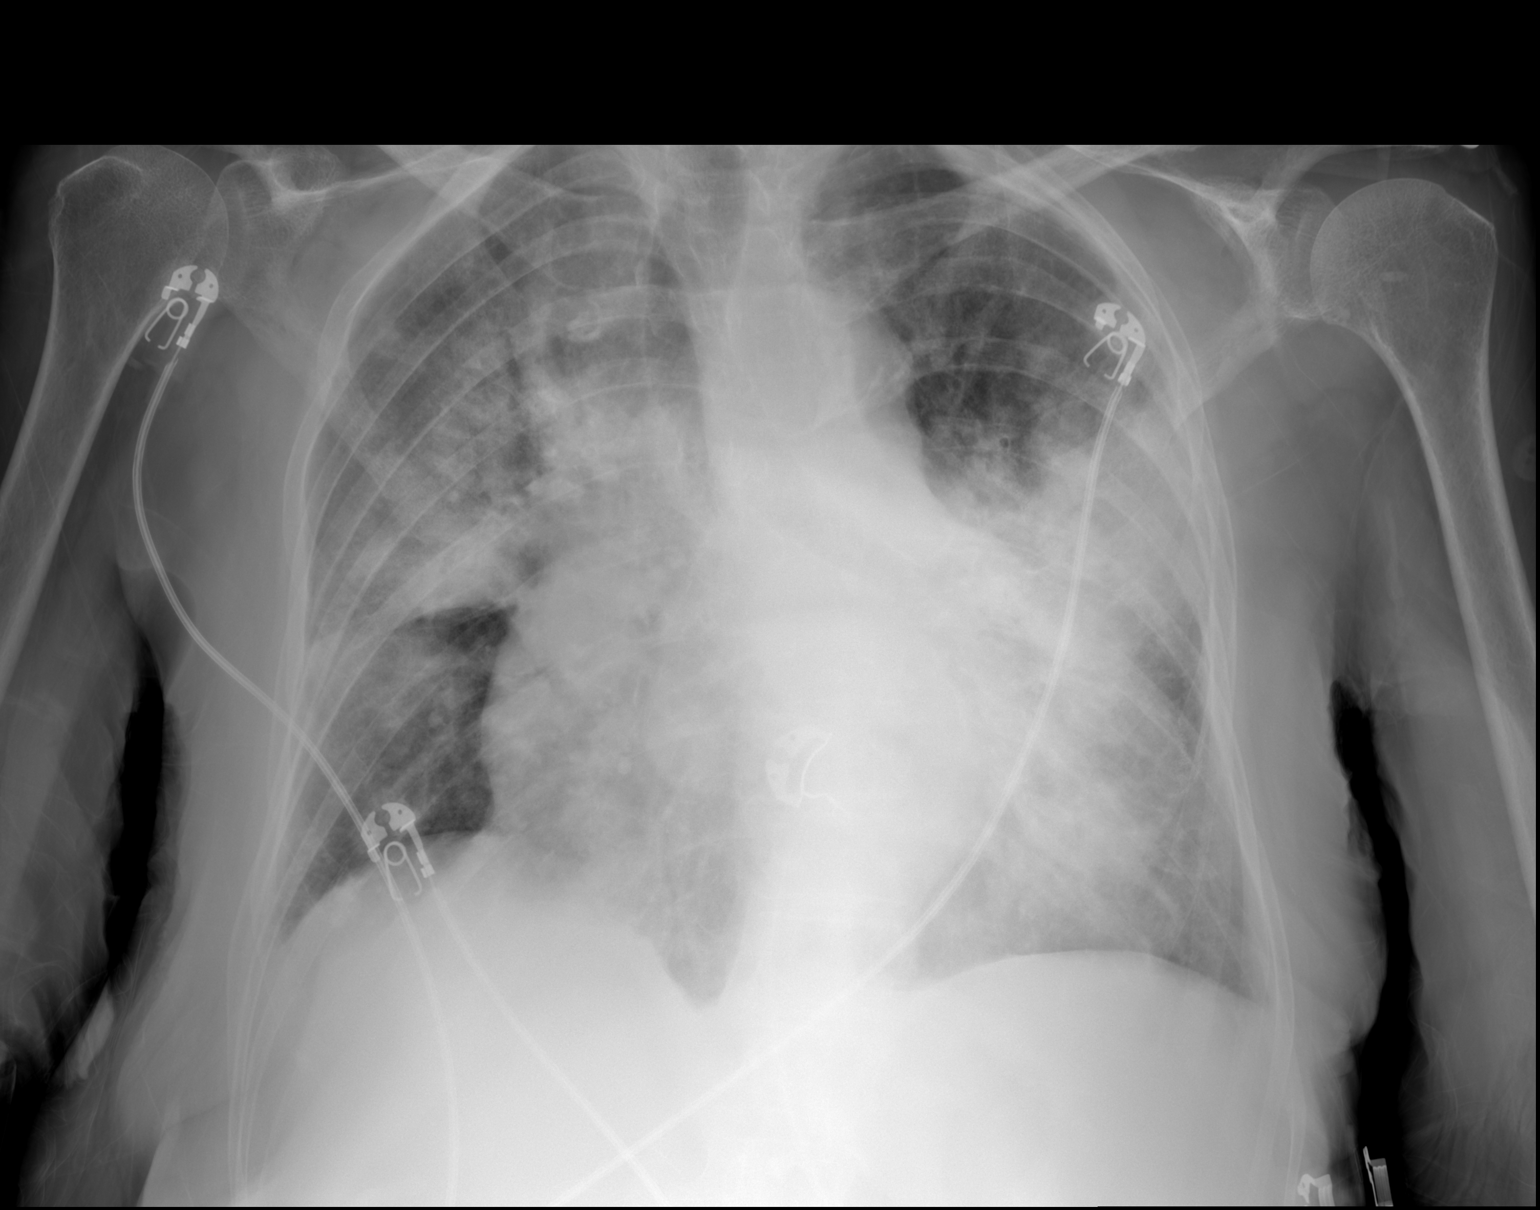

[2 of 2 positions shown; findings below may reference images not displayed]

FINDINGS: Dense airspace disease is present in the upper lobes bilaterally
most consistent with multifocal pneumonia. There is also some
airspace disease in the right middle lobe. Marked cardiomegaly is
identified. No pneumothorax or pleural fluid. Aortic atherosclerosis
is noted. No acute or focal bony abnormality.
IMPRESSION: Dense bilateral upper lobe airspace disease and patchy airspace
opacity in the right middle lobe most consistent with pneumonia.

Cardiomegaly.

Atherosclerosis.

## 2020-04-20 IMAGING — DX DG ABDOMEN 1V
1 series · 1 of 1 positions shown · non-contrast
Comparison: 06/30/2016

CLINICAL DATA: OG tube placement

EXAM:
ABDOMEN - 1 VIEW

[abdomen kub]
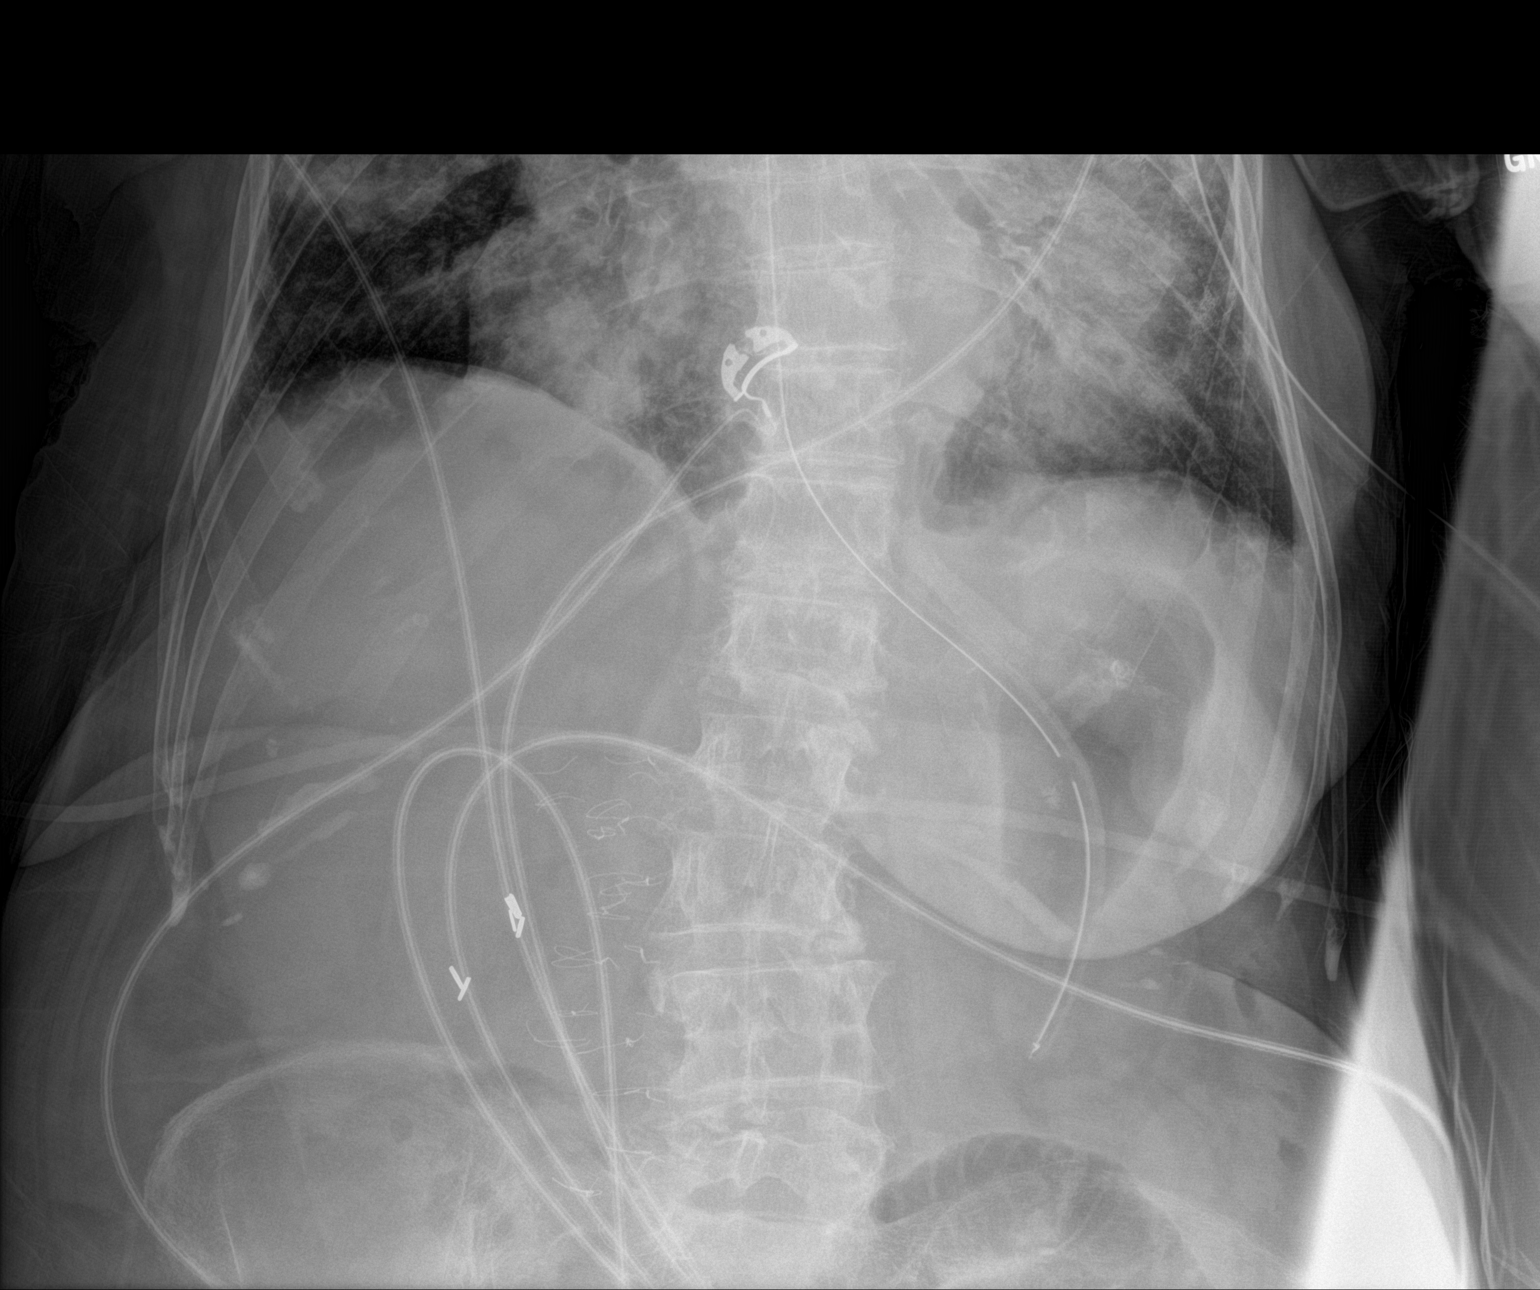

[1 of 1 positions shown; findings below may reference images not displayed]

FINDINGS: Esophageal tube tip and side port overlie the stomach. Postsurgical
changes in the abdomen. Partially visible air filled bowel in the
upper abdomen.
IMPRESSION: Esophageal tube tip overlies the gastric body

## 2020-04-22 IMAGING — DX DG CHEST 1V PORT
1 series · 1 of 1 positions shown · non-contrast
Comparison: Portable exam 6562 hours compared to 03/16/2018

CLINICAL DATA: Acute respiratory failure with hypoxia, history
hypertension, stroke, breast cancer, chronic atrial fibrillation,
pulmonary hypertension, malignant carcinoid tumor, type II diabetes
mellitus, former smoker

EXAM:
PORTABLE CHEST 1 VIEW

[chest ap]
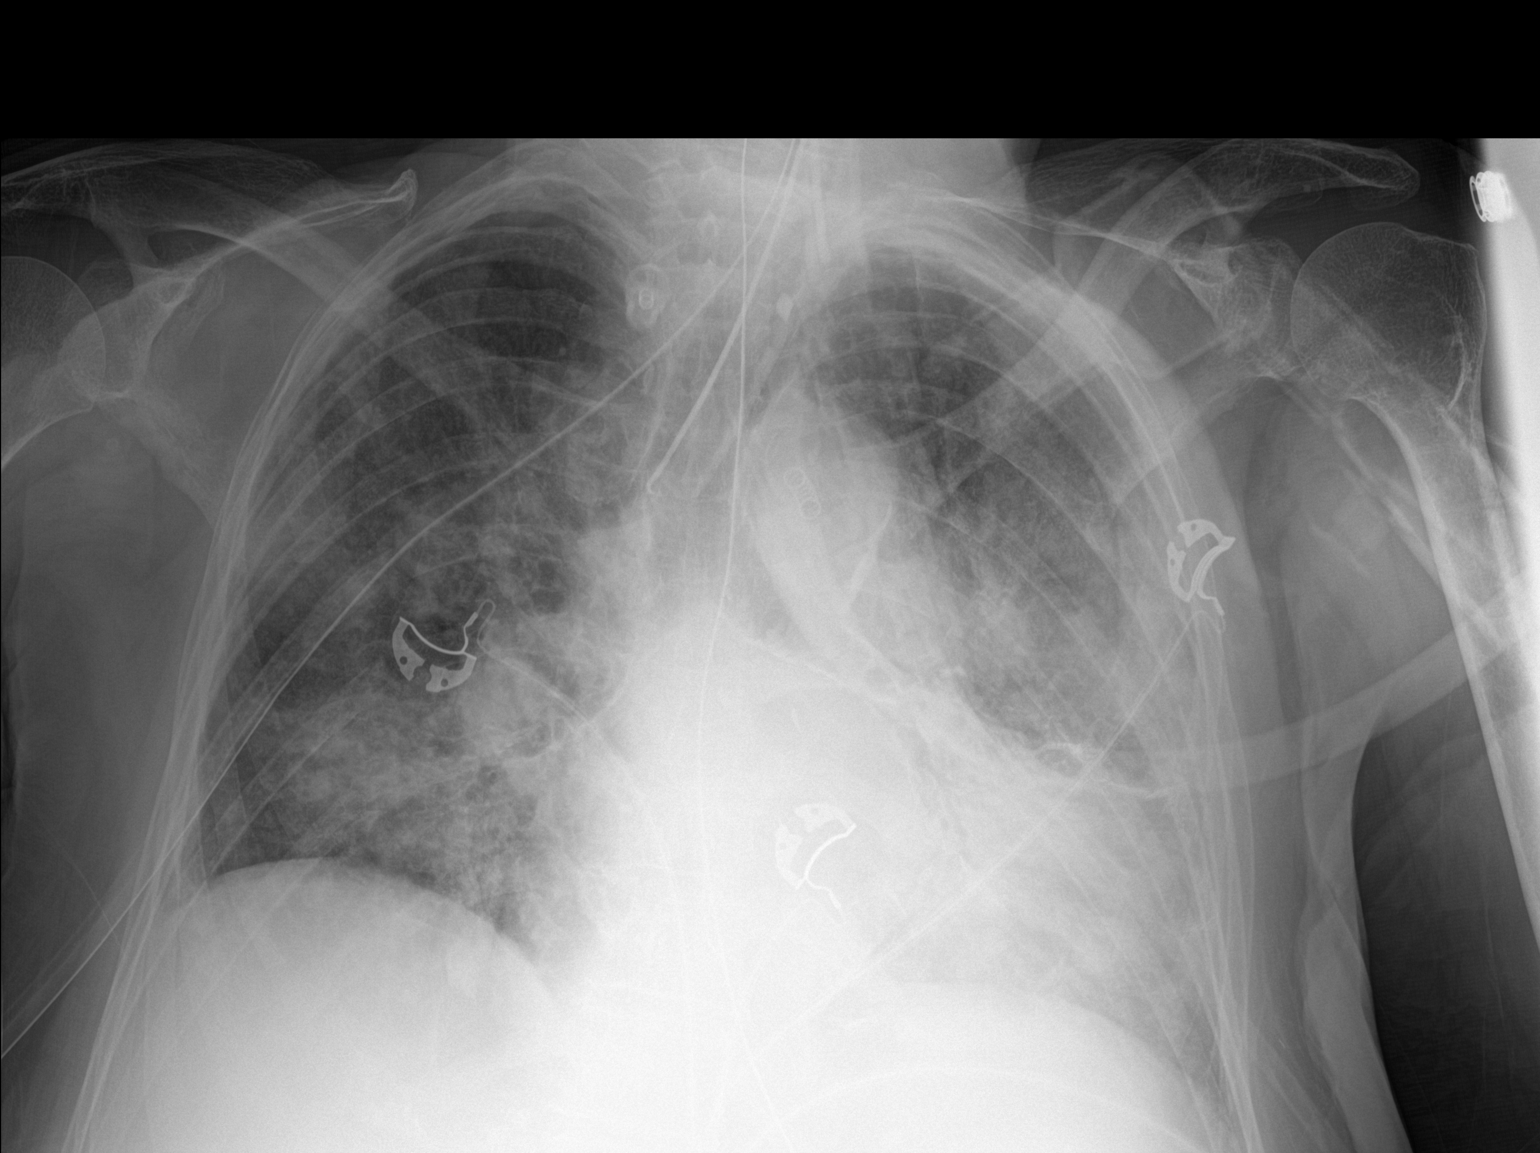

[1 of 1 positions shown; findings below may reference images not displayed]

FINDINGS: Tip of endotracheal tube projects 2.6 cm above carina.

Nasogastric tube extends into stomach.

Enlargement of cardiac silhouette .

Mediastinal contours normal.

Atherosclerotic calcification aorta.

BILATERAL patchy pulmonary infiltrates again identified, favor
pneumonia, slightly improved in RIGHT upper lobe.

No definite pleural effusion or pneumothorax.

Bones demineralized.
IMPRESSION: Persistent BILATERAL pulmonary infiltrates, slightly improved in
RIGHT upper lobe since previous exam.
